# Patient Record
Sex: Female | Born: 1975
Health system: Southern US, Community
[De-identification: ages and names within clinical notes are randomized; demographics above are authoritative.]

## PROBLEM LIST (undated history)

## (undated) DIAGNOSIS — N186 End stage renal disease: Secondary | ICD-10-CM

## (undated) DIAGNOSIS — N039 Chronic nephritic syndrome with unspecified morphologic changes: Secondary | ICD-10-CM

## (undated) DIAGNOSIS — Z992 Dependence on renal dialysis: Secondary | ICD-10-CM

## (undated) DIAGNOSIS — I10 Essential (primary) hypertension: Secondary | ICD-10-CM

## (undated) DIAGNOSIS — K219 Gastro-esophageal reflux disease without esophagitis: Secondary | ICD-10-CM

## (undated) DIAGNOSIS — Z940 Kidney transplant status: Secondary | ICD-10-CM

## (undated) HISTORY — PX: OTHER SURGICAL HISTORY: SHX169

## (undated) HISTORY — DX: End stage renal disease: N18.6

## (undated) HISTORY — DX: Kidney transplant status: Z94.0

## (undated) HISTORY — DX: Chronic nephritic syndrome with unspecified morphologic changes: N03.9

## (undated) NOTE — *Deleted (*Deleted)
HEMATOLOGY/ONCOLOGY CONSULTATION NOTE  Date of Service: 07/23/2020  Patient Care Team: Richarda Osmond, DO as PCP - General  CHIEF COMPLAINTS/PURPOSE OF CONSULTATION:  Newly renal cell carcinoma  HISTORY OF PRESENTING ILLNESS:  Candace Griffin is a wonderful 66 y.o. female with a past medical history significant for chronic glomerulonephritis status post renal transplant in July 2019 and currently taking mycophenolate, tacrolimus, and prednisone, anemia, GERD, hypertension, menorrhagia.  The patient presented to the emergency room with chest pain or shortness of breath for several days.  In the emergency room, her hemoglobin was 4.0 (previously 8.6 on 05/15/2020) and chemistry showed a creatinine of 1.13, calcium 8.0, albumin 2.0, AST 336, ALT 105, alkaline phosphatase 242, and T bili 1.5.  2 units of PRBCs have been ordered.  She had a CTA of the chest which was negative for PE or any other acute finding.  CT of the abdomen and pelvis showed redemonstrated inguinal and intra-abdominal lymphadenopathy, multiple liver lesions-findings concerning for metastatic disease, lymphoma, or post transplant lymphoproliferative disease, 2.2 cm native left lower pole renal mass, left lower quadrant transplant kidney with areas of cortical scarring.  Of note, the retroperitoneal, pelvic, and inguinal lymphadenopathy was present on a CT scan performed on 05/16/2020.  She was referred back to her primary care provider for further work-up of the lymphadenopathy.  She states that she was scheduled to see her primary care provider today to follow-up on these results but ended up coming to the emergency room instead.  She has been seen by interventional radiology for for consideration of CT-guided biopsy of one of her inguinal lymph nodes and underwent CT-guided biopsy of the left inguinal lymph node just prior to my visit.  The patient is somewhat sleepy at the time of visit since she just returned from IR.   She is able to awaken and answer questions.  She states that she has had fatigue and generalized weakness as well as a weight loss of about 20 pounds since the end of August 2021.  She has not been able to eat or drink much of anything secondary to nausea and vomiting.  She reports fevers, but no chills or night sweats.  She is able to palpate the lymph nodes in her inguinal area.  She denies headaches and dizziness.  She has been experiencing chest pain or shortness of breath.  Denies abdominal pain.  She reports significant constipation over the past month.  Denies epistaxis, hemoptysis, hematemesis, hematuria, melena, hematochezia.  Denies lower extremity edema.  Medical oncology was asked to see the patient for recommendations regarding her abnormal CT scan findings and anemia.  INTERVAL HISTORY: Candace Griffin is a wonderful 54 y.o. female who is here for the evaluation and management of metastatic Papillary Renal Cell Carcinoma. The patient's last visit with [place] was on 06/26/2020. The pt reports that she is doing well overall.  The pt reports ***  Of note since the patient's last visit, pt has had *** completed on *** with results revealing ***.  Lab results today (07/23/20) of CBC w/diff and CMP is as follows: all values are WNL except for ***.  On review of systems, pt reports *** and denies ***and any other symptoms.   A&P: -Discussed pt labwork today, 07/23/20; *** -***  MEDICAL HISTORY:  Past Medical History:  Diagnosis Date  . Chronic glomerulonephritis   . Dialysis patient ([place])   . End stage renal disease ([place])   . GERD (gastroesophageal reflux disease)   .  History of renal transplant 10-27-2011  . Hypertension     SURGICAL HISTORY: Past Surgical History:  Procedure Laterality Date  . COLPOSCOPY  2016  . diaylsis shunt Right    arm  . KIDNEY TRANSPLANT  2011/10/27   deceased donor kidney    SOCIAL HISTORY: Social History   Socioeconomic History  . Marital status:  Single    Spouse name: Not on file  . Number of children: 3  . Years of education: 71  . Highest education level: Not on file  Occupational History    Comment: NA  Tobacco Use  . Smoking status: Never Smoker  . Smokeless tobacco: Never Used  Substance and Sexual Activity  . Alcohol use: No  . Drug use: No  . Sexual activity: Not Currently    Birth control/protection: None  Other Topics Concern  . Not on file  Social History Narrative   Lives at home with children   Caffeine - 10/27/16 "addicted to [employer], drank all day long", currently Mtn Dew 12 oz/week   Social Determinants of Health   Financial Resource Strain:   . Difficulty of Paying Living Expenses: Not on file  Food Insecurity:   . Worried About Charity fundraiser in the Last Year: Not on file  . Ran Out of Food in the Last Year: Not on file  Transportation Needs:   . Lack of Transportation (Medical): Not on file  . Lack of Transportation (Non-Medical): Not on file  Physical Activity:   . Days of Exercise per Week: Not on file  . Minutes of Exercise per Session: Not on file  Stress:   . Feeling of Stress : Not on file  Social Connections:   . Frequency of Communication with Friends and Family: Not on file  . Frequency of Social Gatherings with Friends and Family: Not on file  . Attends Religious Services: Not on file  . Active Member of Clubs or Organizations: Not on file  . Attends Archivist Meetings: Not on file  . Marital Status: Not on file  Intimate Partner Violence:   . Fear of Current or Ex-Partner: Not on file  . Emotionally Abused: Not on file  . Physically Abused: Not on file  . Sexually Abused: Not on file    FAMILY HISTORY: Family History  Problem Relation Age of Onset  . Diabetes Father   . Diabetes Paternal Aunt   . Diabetes Paternal Grandmother   . Alcohol abuse Paternal Grandmother   . Alcohol abuse Paternal Uncle   . Cancer Maternal Grandmother        breast  . Breast  cancer Maternal Grandmother   . Alcohol abuse Paternal Grandfather     ALLERGIES:  is allergic to lisinopril, chlorhexidine, and tape.  MEDICATIONS:  Current Outpatient Medications  Medication Sig Dispense Refill  . LORazepam (ATIVAN) 0.5 MG tablet Take 1 tablet (0.5 mg total) by mouth every 8 (eight) hours as needed for anxiety. 45 tablet 0  . meropenem 1 g in sodium chloride 0.9 % 100 mL Inject 1 g into the vein every 8 (eight) hours.    . mycophenolate (CELLCEPT) 250 MG capsule Take 500 mg by mouth 2 (two) times daily.     . ondansetron (ZOFRAN) 8 MG tablet Take 1 tablet (8 mg total) by mouth every 8 (eight) hours as needed for nausea or vomiting. 30 tablet 0  . oxyCODONE (ROXICODONE) 5 MG immediate release tablet Take 2 tablets (10 mg total) by  mouth every 4 (four) hours as needed for moderate pain or severe pain. 60 tablet 0  . pantoprazole sodium (PROTONIX) 40 mg/20 mL PACK Place 20 mLs (40 mg total) into feeding tube 2 (two) times daily. 30 mL   . predniSONE (DELTASONE) 5 MG tablet Take 5 mg by mouth daily with breakfast.     . scopolamine (TRANSDERM-SCOP) 1 MG/3DAYS Place 1 patch (1.5 mg total) onto the skin every 3 (three) days. 10 patch 12  . Tacrolimus ER (ENVARSUS XR) 1 MG TB24 Take 4 mg by mouth daily.      No current facility-administered medications for this visit.    REVIEW OF SYSTEMS:   A 10+ POINT REVIEW OF SYSTEMS WAS OBTAINED including neurology, dermatology, psychiatry, cardiac, respiratory, lymph, extremities, GI, GU, Musculoskeletal, constitutional, breasts, reproductive, HEENT.  All pertinent positives are noted in the HPI.  All others are negative.   PHYSICAL EXAMINATION: ECOG PERFORMANCE STATUS: 2 - Symptomatic, <50% confined to bed  . There were no vitals filed for this visit. There were no vitals filed for this visit. .There is no height or weight on file to calculate BMI.  *** GENERAL:alert, in no acute distress and comfortable SKIN: no acute rashes, no  significant lesions EYES: conjunctiva are pink and non-injected, sclera anicteric OROPHARYNX: MMM, no exudates, no oropharyngeal erythema or ulceration NECK: supple, no JVD LYMPH:  no palpable lymphadenopathy in the cervical, axillary or inguinal regions LUNGS: clear to auscultation b/l with normal respiratory effort HEART: regular rate & rhythm ABDOMEN:  normoactive bowel sounds , non tender, not distended. No palpable hepatosplenomegaly.  Extremity: no pedal edema PSYCH: alert & oriented x 3 with fluent speech NEURO: no focal motor/sensory deficits  LABORATORY DATA:  I have reviewed the data as listed  . CBC Latest Ref Rng & Units 07/16/2020 07/14/2020 07/13/2020  WBC 4.0 - 10.5 K/uL 27.6(H) 21.3(H) 21.5(H)  Hemoglobin 12.0 - 15.0 g/dL 8.3(L) 8.2(L) 8.6(L)  Hematocrit 36 - 46 % 26.2(L) 25.2(L) 26.9(L)  Platelets 150 - 400 K/uL 213 157 133(L)    . CMP Latest Ref Rng & Units 07/16/2020 07/15/2020 07/14/2020  Glucose 70 - 99 mg/dL 170(H) 93 270(H)  BUN 6 - 20 mg/dL 39(H) 40(H) 34(H)  Creatinine 0.44 - 1.00 mg/dL 1.24(H) 1.17(H) 1.33(H)  Sodium 135 - 145 mmol/L 144 145 141  Potassium 3.5 - 5.1 mmol/L 3.1(L) 3.3(L) 3.8  Chloride 98 - 111 mmol/L 114(H) 115(H) 112(H)  CO2 22 - 32 mmol/L 20(L) 20(L) 18(L)  Calcium 8.9 - 10.3 mg/dL 8.0(L) 7.9(L) 7.7(L)  Total Protein 6.5 - 8.1 g/dL 5.6(L) 5.7(L) -  Total Bilirubin 0.3 - 1.2 mg/dL 6.7(H) 6.4(H) -  Alkaline Phos 38 - 126 U/L 650(H) 625(H) -  AST 15 - 41 U/L 181(H) 200(H) -  ALT 0 - 44 U/L 62(H) 59(H) -       RADIOGRAPHIC STUDIES: I have personally reviewed the radiological images as listed and agreed with the findings in the report. CT ABDOMEN PELVIS WO CONTRAST  Result Date: 07/12/2020 CLINICAL DATA:  38 year old female with nausea and vomiting. Metastatic cancer of unknown primary. EXAM: CT CHEST, ABDOMEN AND PELVIS WITHOUT CONTRAST TECHNIQUE: Multidetector CT imaging of the chest, abdomen and pelvis was performed following  the standard protocol without IV contrast. COMPARISON:  CT of the chest abdomen pelvis dated 07/01/2020. FINDINGS: CT CHEST FINDINGS Evaluation of this exam is limited in the absence of intravenous contrast. Cardiovascular: There is no cardiomegaly or pericardial effusion. There is mild atherosclerotic calcification of the  aortic arch. The central pulmonary arteries are grossly unremarkable on this noncontrast CT. Mediastinum/Nodes: No definite hilar or mediastinal adenopathy. Evaluation however is limited in the absence of intravenous contrast as well as due to consolidative changes of the lungs. A feeding tube noted within the esophagus. The thyroid gland is grossly unremarkable. No mediastinal fluid collection. Lungs/Pleura: Small bilateral pleural effusions. There are large areas of consolidation involving the lower lobes as well as smaller areas of patchy ground-glass density involving the right middle lobe, right upper lobe, and lingula most consistent with multifocal pneumonia. Aspiration is not excluded clinical correlation is recommended. There is no pneumothorax. The central airways are patent. Musculoskeletal: No chest wall mass or suspicious bone lesions identified. CT ABDOMEN PELVIS FINDINGS No intra-abdominal free air. Small perihepatic free fluid. Hepatobiliary: Several faint hepatic hypodense lesions consistent with metastatic disease. These were better seen on the contrast enhanced CT of 07/01/2020. no intrahepatic biliary dilatation. High attenuating content within the gallbladder may represent sludge or vicarious excretion of recently administered contrast. Pancreas: The pancreas is grossly unremarkable. Spleen: Normal in size without focal abnormality. Adrenals/Urinary Tract: The adrenal glands are suboptimally visualized. Severe bilateral renal atrophy. There is a left lower quadrant renal transplant. There is no hydronephrosis or nephrolithiasis of the transplant kidney. No peritransplant fluid  collection. There is duplicated appearance of the renal transplant collecting system. The urinary bladder is unremarkable. Stomach/Bowel: A feeding tube with tip in the distal duodenum. There is no bowel obstruction. The appendix is normal. Vascular/Lymphatic: Moderate aortoiliac atherosclerotic disease. No portal venous gas. Large and bulky retroperitoneal adenopathy as well as bilateral inguinal adenopathy. Large adenopathy along the left iliac chain. Reproductive: The uterus is grossly unremarkable. Other: There is a 3.8 x 2.7 cm low attenuating collection with areas of calcification along the right pelvic sidewall similar to prior CT. This is not well characterized but may related to an old hematoma or necrotic collection. Musculoskeletal: There is diffuse subcutaneous edema and anasarca, new or worsened since the prior CT. Sclerotic osseous metastatic disease predominantly involving T12, L1, L2 as seen previously. Sclerotic metastatic involvement of the right pubic bone. No acute osseous pathology. IMPRESSION: 1. Multifocal pneumonia. Aspiration is not excluded. Clinical correlation is recommended. 2. Small bilateral pleural effusions, new or worsened since the prior CT. 3. Extensive retroperitoneal nodal metastatic disease as well as hepatic and osseous metastatic disease as seen previously. The liver lesions are better seen on the prior contrast enhanced CT. 4. Atrophic native kidneys with a Left lower quadrant renal transplant. No hydronephrosis or nephrolithiasis. 5. Diffuse subcutaneous edema and anasarca, new or worsened since the prior CT. 6. Aortic Atherosclerosis (ICD10-I70.0). Electronically Signed   By: Anner Crete M.D.   On: 07/12/2020 17:07   CT ABDOMEN PELVIS WO CONTRAST  Result Date: 06/26/2020 CLINICAL DATA:  Diffuse abdominal pain and vomiting. History of cancer and renal transplant. Liver mass post recent liver biopsy. EXAM: CT ABDOMEN AND PELVIS WITHOUT CONTRAST TECHNIQUE:  Multidetector CT imaging of the abdomen and pelvis was performed following the standard protocol without IV contrast. COMPARISON:  Contrast-enhanced exam 05/27/2020. FINDINGS: Lower chest: No focal airspace disease, pleural fluid, or pulmonary nodularity. Heart is normal in size. Hepatobiliary: Patient's known hepatic lesions are not well demonstrated on noncontrast exam. Liver parenchyma is heterogeneous. There may be a small subcapsular hematoma inferiorly from recent liver biopsy, not well-defined on this noncontrast exam. The gallbladder is present. There is high-density material in the gallbladder lumen, possibly sludge. No calcified gallstone. Common bile  duct is poorly defined, but no evidence of biliary dilatation. Pancreas: No ductal dilatation or inflammation. Spleen: Normal in size without focal abnormality. Adrenals/Urinary Tract: Normal adrenal glands. Chronic bilateral native renal atrophy. Solid-appearing lesion in the lower left renal hilum was better appreciated on prior contrast-enhanced CT, grossly unchanged, series 2, image 35. Transplant kidney in the left lower quadrant. Scarring in the left lower pole is better appreciated on prior contrast-enhanced exam. Similar fullness of the renal collecting system without frank hydronephrosis. No transplant perinephric edema. Sequela of failed transplant in the right iliac fossa. The urinary bladder is unremarkable. Stomach/Bowel: Bowel evaluation is limited in the absence of enteric contrast. There is no bowel obstruction or evidence of bowel inflammation. The appendix is normal. Vascular/Lymphatic: Bulky retroperitoneal adenopathy, some of which is low-density and typical of necrosis. Occasional areas of nodal calcification. Adenopathy extends from the retrocrural space to the bilateral common iliac arteries, and left external iliac station. There also enlarged bilateral inguinal lymph nodes, left greater than right. Index aortocaval node measures 4.1 x  2.4 cm, previously 4.2 x 2.6 cm. Overall adenopathy is not significantly changed in the interim allowing for noncontrast technique. There is aortic and branch atherosclerosis. Reproductive: Bulky uterus with fibroids.  No obvious adnexal mass. Other: There is trace free fluid in the pelvis, slightly increased from prior exam. No other free fluid or ascites. No free air. Musculoskeletal: Ill-defined sclerosis involving right L2 vertebral body, nonspecific but unchanged from previous. Sclerotic density in the left acetabulum. There is hemi transitional lumbosacral anatomy. No fracture or acute osseous abnormality. IMPRESSION: 1. Patient's known hepatic lesions are not well-defined on this noncontrast exam. There may be a small subcapsular hematoma inferiorly from recent liver biopsy, not well-defined. No evidence of hemoperitoneum. 2. Bulky retroperitoneal and bilateral inguinal adenopathy, grossly unchanged from prior. 3. Transplant kidney in the left lower quadrant with unchanged fullness of the renal collecting system. Sequela of failed transplant in the right iliac fossa. Stable soft tissue density in the region of the native left kidney lower hilum. 4. Bulky uterus with fibroids. 5. Nonspecific sclerosis involving the right aspect of L2 vertebral body. Aortic Atherosclerosis (ICD10-I70.0). Electronically Signed   By: Keith Rake M.D.   On: 06/26/2020 16:45   CT HEAD WO CONTRAST  Result Date: 07/10/2020 CLINICAL DATA:  Delirium, history of urologic malignancy. EXAM: CT HEAD WITHOUT CONTRAST TECHNIQUE: Contiguous axial images were obtained from the base of the skull through the vertex without intravenous contrast. COMPARISON:  MRI 06/28/2020 FINDINGS: Brain: No evidence of acute infarction, hemorrhage, hydrocephalus, extra-axial collection, mass effect or visible mass lesion 4 within the limitations of an unenhanced CT exam. Basal cisterns are patent. Midline intracranial structures are normal. Cerebellar  tonsils are normally positioned. Vascular: No hyperdense vessel or unexpected calcification. Skull: Few prominent venous lakes. No convincing focal sclerotic or lytic osseous metastatic disease. A previously seen C2 vertebral body metastasis is not within the margins of imaging. Scalp soft tissues are unremarkable. Sinuses/Orbits: Paranasal sinuses and mastoid air cells are predominantly clear. Included orbital structures are unremarkable. Other: None IMPRESSION: 1. No acute intracranial findings. No visible metastatic lesions within the limitations of unenhanced CT imaging. 2. A previously seen C2 vertebral body metastasis is not within the margins of imaging. Electronically Signed   By: Lovena Le M.D.   On: 07/10/2020 19:59   CT CHEST WO CONTRAST  Result Date: 07/12/2020 CLINICAL DATA:  49 year old female with nausea and vomiting. Metastatic cancer of unknown primary. EXAM: CT CHEST,  ABDOMEN AND PELVIS WITHOUT CONTRAST TECHNIQUE: Multidetector CT imaging of the chest, abdomen and pelvis was performed following the standard protocol without IV contrast. COMPARISON:  CT of the chest abdomen pelvis dated 07/01/2020. FINDINGS: CT CHEST FINDINGS Evaluation of this exam is limited in the absence of intravenous contrast. Cardiovascular: There is no cardiomegaly or pericardial effusion. There is mild atherosclerotic calcification of the aortic arch. The central pulmonary arteries are grossly unremarkable on this noncontrast CT. Mediastinum/Nodes: No definite hilar or mediastinal adenopathy. Evaluation however is limited in the absence of intravenous contrast as well as due to consolidative changes of the lungs. A feeding tube noted within the esophagus. The thyroid gland is grossly unremarkable. No mediastinal fluid collection. Lungs/Pleura: Small bilateral pleural effusions. There are large areas of consolidation involving the lower lobes as well as smaller areas of patchy ground-glass density involving the right  middle lobe, right upper lobe, and lingula most consistent with multifocal pneumonia. Aspiration is not excluded clinical correlation is recommended. There is no pneumothorax. The central airways are patent. Musculoskeletal: No chest wall mass or suspicious bone lesions identified. CT ABDOMEN PELVIS FINDINGS No intra-abdominal free air. Small perihepatic free fluid. Hepatobiliary: Several faint hepatic hypodense lesions consistent with metastatic disease. These were better seen on the contrast enhanced CT of 07/01/2020. no intrahepatic biliary dilatation. High attenuating content within the gallbladder may represent sludge or vicarious excretion of recently administered contrast. Pancreas: The pancreas is grossly unremarkable. Spleen: Normal in size without focal abnormality. Adrenals/Urinary Tract: The adrenal glands are suboptimally visualized. Severe bilateral renal atrophy. There is a left lower quadrant renal transplant. There is no hydronephrosis or nephrolithiasis of the transplant kidney. No peritransplant fluid collection. There is duplicated appearance of the renal transplant collecting system. The urinary bladder is unremarkable. Stomach/Bowel: A feeding tube with tip in the distal duodenum. There is no bowel obstruction. The appendix is normal. Vascular/Lymphatic: Moderate aortoiliac atherosclerotic disease. No portal venous gas. Large and bulky retroperitoneal adenopathy as well as bilateral inguinal adenopathy. Large adenopathy along the left iliac chain. Reproductive: The uterus is grossly unremarkable. Other: There is a 3.8 x 2.7 cm low attenuating collection with areas of calcification along the right pelvic sidewall similar to prior CT. This is not well characterized but may related to an old hematoma or necrotic collection. Musculoskeletal: There is diffuse subcutaneous edema and anasarca, new or worsened since the prior CT. Sclerotic osseous metastatic disease predominantly involving T12, L1, L2  as seen previously. Sclerotic metastatic involvement of the right pubic bone. No acute osseous pathology. IMPRESSION: 1. Multifocal pneumonia. Aspiration is not excluded. Clinical correlation is recommended. 2. Small bilateral pleural effusions, new or worsened since the prior CT. 3. Extensive retroperitoneal nodal metastatic disease as well as hepatic and osseous metastatic disease as seen previously. The liver lesions are better seen on the prior contrast enhanced CT. 4. Atrophic native kidneys with a Left lower quadrant renal transplant. No hydronephrosis or nephrolithiasis. 5. Diffuse subcutaneous edema and anasarca, new or worsened since the prior CT. 6. Aortic Atherosclerosis (ICD10-I70.0). Electronically Signed   By: Anner Crete M.D.   On: 07/12/2020 17:07   MR BRAIN WO CONTRAST  Result Date: 07/12/2020 CLINICAL DATA:  Metastatic carcinoma, mental status change EXAM: MRI HEAD WITHOUT CONTRAST TECHNIQUE: Multiplanar, multiecho pulse sequences of the brain and surrounding structures were obtained without intravenous contrast. COMPARISON:  06/28/2020 FINDINGS: Brain: There is no acute infarction or intracranial hemorrhage. There is no intracranial mass, mass effect, or edema. There is no hydrocephalus or  extra-axial fluid collection. Ventricles are normal in size and configuration. Vascular: Major vessel flow voids at the skull base are preserved. Skull and upper cervical spine: Discrete C2 lesion on prior postcontrast imaging is not identified on this noncontrast study. Sinuses/Orbits: Paranasal sinuses are aerated. Orbits are unremarkable. Other: Sella is unremarkable.  Mastoid air cells are clear. IMPRESSION: No acute intracranial abnormality. Electronically Signed   By: Macy Mis M.D.   On: 07/12/2020 20:45   MR Brain W Wo Contrast  Result Date: 06/28/2020 CLINICAL DATA:  Urologic cancer, staging EXAM: MRI HEAD WITHOUT AND WITH CONTRAST TECHNIQUE: Multiplanar, multiecho pulse sequences of  the brain and surrounding structures were obtained without and with intravenous contrast. CONTRAST:  13mL GADAVIST GADOBUTROL 1 MMOL/ML IV SOLN COMPARISON:  MRI head 11/17/2016 FINDINGS: Brain: No acute infarction, hemorrhage, hydrocephalus, extra-axial collection or mass lesion. Normal white matter. Negative for metastatic disease to the brain. No enhancing mass lesion in the brain. Vascular: Normal arterial flow voids. Skull and upper cervical spine: Enhancing lesion in the lower body of the C2 vertebral body. This shows decreased signal prior to contrast and is consistent with metastatic disease. No fracture or cord compression. No skull lesion identified. Benign fatty lesion in the left parietal bone. Sinuses/Orbits: Paranasal sinuses clear.  Negative orbit Other: None IMPRESSION: Negative for metastatic disease to the brain 1 cm enhancing lesion C2 vertebral body compatible with metastatic disease. No fracture or cord compression Electronically Signed   By: Franchot Gallo M.D.   On: 06/28/2020 21:31   MR LUMBAR SPINE WO CONTRAST  Result Date: 07/12/2020 CLINICAL DATA:  Initial evaluation for severe lower back pain and pelvic pain, history of renal transplant with metastatic carcinoma. EXAM: MRI LUMBAR SPINE WITHOUT CONTRAST MRI PELVIS WITHOUT CONTRAST TECHNIQUE: Multiplanar, multisequence MR imaging of the lumbar spine was performed. No intravenous contrast was administered. COMPARISON:  Prior CT from earlier the same day. FINDINGS: MRI LUMBAR SPINE FINDINGS: Segmentation: Standard. Lowest well-formed disc space labeled the L5-S1 level. Alignment: Physiologic with preservation of the normal lumbar lordosis. No listhesis or static subluxation. Vertebrae: Abnormal T1 hypointense, STIR hyperintense lesion seen involving the T12, L1, and L2 vertebral bodies, consistent with osseous metastatic disease. Involvement of these levels is fairly diffuse and infiltrative in nature. Heterogeneous marrow signal elsewhere  within the visualized lumbosacral spine without definite discrete metastasis. No associated pathologic fracture. No extra osseous extension or epidural extension of tumor. Conus medullaris and cauda equina: Conus extends to the L1 level. Conus and cauda equina appear normal. Paraspinal and other soft tissues: Extensive bulky retroperitoneal and iliac adenopathy, better seen on recent CT. Associated reactive marrow edema within the posterior paraspinous and psoas musculature. Marked atrophy of the native kidneys noted. Renal transplant within the left iliac fossa partially visualized. Disc levels: No significant disc pathology seen within the lumbar spine. Intervertebral discs are well hydrated with preserved disc height. No significant disc bulge or focal disc herniation. Minimal multilevel facet hypertrophy noted throughout the lumbar spine. Epidural lipomatosis noted within the lower lumbar spine. No significant spinal stenosis. Foramina remain patent. No impingement. MRI PELVIS FINDINGS: Examination is technically limited as the patient was unable to tolerate the full length of the examination, with the exam terminated early. Coronal T1 weighted sequence only was performed. Visualized bowels normal in caliber without evidence for obstruction. Normal appendix. Marked atrophy of the native kidneys with a renal transplant within the left iliac fossa. No appreciable hydronephrosis or other obvious abnormality about the renal transplant on this  limited exam. Urinary bladder within normal limits. Uterus within normal limits. Few nabothian cysts noted about the cervix. Ovaries not well evaluated on this limited study. No appreciable adnexal mass. Normal flow voids seen throughout the visualized abdomen and pelvis. Extensive bulky retroperitoneal and iliac adenopathy again seen, better evaluated on dedicated CT from earlier the same day. For reference purposes, the largest nodal conglomerate seen within the lower right  periaortic region and measures 5.0 x 4.7 cm (series 11, image 24). Scattered inguinal adenopathy noted as well, with largest node seen on the left and measuring 1.9 cm in short axis (series 11, image 26). Scattered small volume free fluid noted within the pelvis. Scattered edema within the psoas and paraspinous musculature better seen on corresponding lumbar spine portion of this exam. 4.6 x 4.3 cm heterogeneous and partially calcified collection at the right lower quadrant again noted, nonspecific, but may be related to a chronic hematoma or other collection (series 10, image 57). Finding better evaluated on prior CT. Heterogeneous signal intensity seen throughout the bone marrow of the pelvis. Probable osseous metastasis measuring approximately 1.8 cm seen at the right ischial tuberosity (series 11, image 13). No other definite discrete osseous metastatic lesions seen on this limited exam. IMPRESSION: 1. Osseous metastatic disease involving the T12, L1, and L2 vertebral bodies. No associated pathologic fracture or extra osseous extension of tumor. 2. Probable 1.8 cm osseous metastasis involving the right ischial tuberosity. 3. Extensive bulky retroperitoneal, iliac, and inguinal adenopathy as above, consistent with nodal metastatic disease. Findings better evaluated on recent CT. 4. Scattered edema within the posterior paraspinous and psoas musculature, with scattered small volume free fluid within the pelvis. Findings are likely reactive in nature. 5. Marked atrophy of the native kidneys with left lower quadrant renal transplant in place. No appreciable hydronephrosis or other obvious abnormality about the renal transplant on this limited exam. 6. No significant disc pathology or stenosis within the lumbar spine. No impingement. 7. Please note that the pelvic portion of this exam is markedly limited as the patient terminated the study early. Coronal T1 weighted sequence only was performed. Electronically Signed    By: Jeannine Boga M.D.   On: 07/12/2020 23:36   MR PELVIS WO CONTRAST  Result Date: 07/12/2020 CLINICAL DATA:  Initial evaluation for severe lower back pain and pelvic pain, history of renal transplant with metastatic carcinoma. EXAM: MRI LUMBAR SPINE WITHOUT CONTRAST MRI PELVIS WITHOUT CONTRAST TECHNIQUE: Multiplanar, multisequence MR imaging of the lumbar spine was performed. No intravenous contrast was administered. COMPARISON:  Prior CT from earlier the same day. FINDINGS: MRI LUMBAR SPINE FINDINGS: Segmentation: Standard. Lowest well-formed disc space labeled the L5-S1 level. Alignment: Physiologic with preservation of the normal lumbar lordosis. No listhesis or static subluxation. Vertebrae: Abnormal T1 hypointense, STIR hyperintense lesion seen involving the T12, L1, and L2 vertebral bodies, consistent with osseous metastatic disease. Involvement of these levels is fairly diffuse and infiltrative in nature. Heterogeneous marrow signal elsewhere within the visualized lumbosacral spine without definite discrete metastasis. No associated pathologic fracture. No extra osseous extension or epidural extension of tumor. Conus medullaris and cauda equina: Conus extends to the L1 level. Conus and cauda equina appear normal. Paraspinal and other soft tissues: Extensive bulky retroperitoneal and iliac adenopathy, better seen on recent CT. Associated reactive marrow edema within the posterior paraspinous and psoas musculature. Marked atrophy of the native kidneys noted. Renal transplant within the left iliac fossa partially visualized. Disc levels: No significant disc pathology seen within the lumbar spine.  Intervertebral discs are well hydrated with preserved disc height. No significant disc bulge or focal disc herniation. Minimal multilevel facet hypertrophy noted throughout the lumbar spine. Epidural lipomatosis noted within the lower lumbar spine. No significant spinal stenosis. Foramina remain patent. No  impingement. MRI PELVIS FINDINGS: Examination is technically limited as the patient was unable to tolerate the full length of the examination, with the exam terminated early. Coronal T1 weighted sequence only was performed. Visualized bowels normal in caliber without evidence for obstruction. Normal appendix. Marked atrophy of the native kidneys with a renal transplant within the left iliac fossa. No appreciable hydronephrosis or other obvious abnormality about the renal transplant on this limited exam. Urinary bladder within normal limits. Uterus within normal limits. Few nabothian cysts noted about the cervix. Ovaries not well evaluated on this limited study. No appreciable adnexal mass. Normal flow voids seen throughout the visualized abdomen and pelvis. Extensive bulky retroperitoneal and iliac adenopathy again seen, better evaluated on dedicated CT from earlier the same day. For reference purposes, the largest nodal conglomerate seen within the lower right periaortic region and measures 5.0 x 4.7 cm (series 11, image 24). Scattered inguinal adenopathy noted as well, with largest node seen on the left and measuring 1.9 cm in short axis (series 11, image 26). Scattered small volume free fluid noted within the pelvis. Scattered edema within the psoas and paraspinous musculature better seen on corresponding lumbar spine portion of this exam. 4.6 x 4.3 cm heterogeneous and partially calcified collection at the right lower quadrant again noted, nonspecific, but may be related to a chronic hematoma or other collection (series 10, image 57). Finding better evaluated on prior CT. Heterogeneous signal intensity seen throughout the bone marrow of the pelvis. Probable osseous metastasis measuring approximately 1.8 cm seen at the right ischial tuberosity (series 11, image 13). No other definite discrete osseous metastatic lesions seen on this limited exam. IMPRESSION: 1. Osseous metastatic disease involving the T12, L1, and  L2 vertebral bodies. No associated pathologic fracture or extra osseous extension of tumor. 2. Probable 1.8 cm osseous metastasis involving the right ischial tuberosity. 3. Extensive bulky retroperitoneal, iliac, and inguinal adenopathy as above, consistent with nodal metastatic disease. Findings better evaluated on recent CT. 4. Scattered edema within the posterior paraspinous and psoas musculature, with scattered small volume free fluid within the pelvis. Findings are likely reactive in nature. 5. Marked atrophy of the native kidneys with left lower quadrant renal transplant in place. No appreciable hydronephrosis or other obvious abnormality about the renal transplant on this limited exam. 6. No significant disc pathology or stenosis within the lumbar spine. No impingement. 7. Please note that the pelvic portion of this exam is markedly limited as the patient terminated the study early. Coronal T1 weighted sequence only was performed. Electronically Signed   By: Jeannine Boga M.D.   On: 07/12/2020 23:36   NM Hepatobiliary Liver Func  Result Date: 07/15/2020 CLINICAL DATA:  Right upper quadrant pain. EXAM: NUCLEAR MEDICINE HEPATOBILIARY IMAGING TECHNIQUE: Sequential images of the abdomen were obtained out to 60 minutes following intravenous administration of radiopharmaceutical. RADIOPHARMACEUTICALS:  8.0 mCi Tc-50m  Choletec IV COMPARISON:  None. FINDINGS: Prompt uptake and biliary excretion of activity by the liver is seen. Gallbladder activity is visualized, consistent with patency of cystic duct. Biliary activity passes into small bowel, consistent with patent common bile duct. IMPRESSION: Prompt visualization of the gallbladder without evidence of acute cholecystitis. Electronically Signed   By: Virgina Norfolk M.D.   On: 07/15/2020  17:25   CT ABDOMEN PELVIS W CONTRAST  Result Date: 07/01/2020 CLINICAL DATA:  Sepsis, abdominal pain, previous kidney transplant x2. Metastatic carcinoma of  unknown primary. EXAM: CT ABDOMEN AND PELVIS WITH CONTRAST TECHNIQUE: Multidetector CT imaging of the abdomen and pelvis was performed using the standard protocol following bolus administration of intravenous contrast. CONTRAST:  53mL OMNIPAQUE IOHEXOL 300 MG/ML  SOLN COMPARISON:  06/26/2020 and previous FINDINGS: Lower chest: Trace pleural effusions. Hepatobiliary: Multiple liver lesions. 3.2 cm segment 2 lesion shows definite progression since 05/27/2020, although is more wedge-shaped and may represent focal infarct. Multiple low-attenuation lesions in the right lobe have not convincingly changed allowing for differences in scan timing. There are nonspecific transient hepatic attenuation differences on the portal venous phase which resolve on delayed venous phase imaging. No biliary ductal dilatation. Gallbladder unremarkable. Pancreas: Unremarkable. No pancreatic ductal dilatation or surrounding inflammatory changes. Spleen: Normal in size. There peripheral areas of decreased enhancement which were not present on prior study of 05/27/2020, however this area was not included on the delayed scan, and may represent transient attenuation differences versus true lesions. Adrenals/Urinary Tract: Adrenal glands unremarkable. Marked atrophy of native kidneys. Normal enhancement of left iliac fossa transplant kidney. Coarse calcifications and low-attenuation in the right iliac fossa presumably related to remote transplant. The urinary bladder is incompletely distended. Stomach/Bowel: Stomach is decompressed. Small bowel is nondilated. The colon is incompletely distended, unremarkable. Vascular/Lymphatic: Bulky bilateral inguinal, left external iliac, bilateral common iliac, left para-aortic and aortocaval adenopathy as before. The larger nodes contain scattered calcifications and some with central low-attenuation regions as before. Moderate atheromatous aortoiliac plaque without aneurysm. Reproductive: Uterine fibroid.   No adnexal mass. Other: Small volume perihepatic and pelvic ascites slightly increased. No free air. Musculoskeletal: Poorly marginated sclerotic lesions in T12, L1, and L2 vertebral bodies, progressive since 05/16/2020. benign left supra-acetabular bone island stable since 02/03/2015. IMPRESSION: 1. No definite source of sepsis identified. 2. Extensive pelvic and retroperitoneal nodal; hepatic; and osseous metastatic disease. 3. Slight increase in small volume perihepatic and pelvic ascites. Aortic Atherosclerosis (ICD10-I70.0). Electronically Signed   By: Lucrezia Europe M.D.   On: 07/01/2020 14:23   DG CHEST PORT 1 VIEW  Result Date: 07/13/2020 CLINICAL DATA:  Central line placement. EXAM: PORTABLE CHEST 1 VIEW COMPARISON:  07/12/2020 FINDINGS: Enteric tube courses into the region of the stomach and off the film as tip is not visualized. Interval placement of right IJ central venous catheter which has tip over the right atrium and could be pulled back 4-5 cm. Lungs are somewhat hypoinflated demonstrate mild asymmetric bilateral perihilar opacification left worse than right with slight interval improvement as this may be due to interstitial edema versus infection. No effusion. No pneumothorax. Cardiomediastinal silhouette and remainder of the exam is unchanged. IMPRESSION: 1. Slight interval improvement in bilateral perihilar opacification left worse than right which may be due to interstitial edema versus infection. 2. Right IJ central venous catheter with tip over the right atrium and could be pulled back 4-5 cm. No pneumothorax. Electronically Signed   By: Marin Olp M.D.   On: 07/13/2020 09:59   DG CHEST PORT 1 VIEW  Result Date: 07/12/2020 CLINICAL DATA:  Dyspnea EXAM: PORTABLE CHEST 1 VIEW COMPARISON:  07/11/2020 FINDINGS: Supine chest radiograph. Lung volumes are small and pulmonary insufflation has diminished since prior examination. There is developing consolidation within the left mid lung zone,  likely infectious in the acute setting. Linear atelectasis within the right mid lung zone. No pneumothorax or pleural effusion.  Cardiac size within normal limits. Pulmonary vascularity is normal. IMPRESSION: Left mid lung zone consolidation, likely infectious. Electronically Signed   By: Fidela Salisbury MD   On: 07/12/2020 06:53   DG CHEST PORT 1 VIEW  Result Date: 07/11/2020 CLINICAL DATA:  Shortness of breath EXAM: PORTABLE CHEST 1 VIEW COMPARISON:  07/10/2020 FINDINGS: Hazy opacity of the left more than right lower chest. No visible effusion or pneumothorax. Normal heart size and mediastinal contours. IMPRESSION: Stable low volume chest with infiltrates or atelectasis at the bases. Electronically Signed   By: Monte Fantasia M.D.   On: 07/11/2020 07:39   DG CHEST PORT 1 VIEW  Result Date: 07/10/2020 CLINICAL DATA:  Liver carcinoma.  Hypertension. EXAM: PORTABLE CHEST 1 VIEW COMPARISON:  June 26, 2020 FINDINGS: There is consolidation in the left lower lobe with small left pleural effusion. There is ill-defined airspace opacity in the right base. Heart size and pulmonary vascularity are normal. No adenopathy. There is aortic atherosclerosis. No bone lesions. IMPRESSION: Consolidation left lower lobe with small left pleural effusion. Patchy infiltrate right base. Heart size within normal limits. No adenopathy evident. Aortic Atherosclerosis (ICD10-I70.0). Electronically Signed   By: Lowella Grip III M.D.   On: 07/10/2020 13:48   DG Chest Port 1 View  Result Date: 06/26/2020 CLINICAL DATA:  Weakness EXAM: PORTABLE CHEST 1 VIEW COMPARISON:  05/26/2020 FINDINGS: The heart size and mediastinal contours are within normal limits. Both lungs are clear. No pleural effusion or pneumothorax. The visualized skeletal structures are unremarkable. IMPRESSION: No acute process in the chest. Electronically Signed   By: Macy Mis M.D.   On: 06/26/2020 13:45   DG Abd Portable 2V  Result Date: 07/08/2020  CLINICAL DATA:  Nausea and vomiting. EXAM: PORTABLE ABDOMEN - 2 VIEW COMPARISON:  07/01/2020 FINDINGS: Gas distended transverse colon but no overt obstructive pattern. No abnormal stool retention or evidence of small bowel obstruction. Clear lung bases. Calcification in the right iliac fossa from failed transplant based on CT. IMPRESSION: Normal bowel gas pattern.  No abnormal stool retention. Electronically Signed   By: Monte Fantasia M.D.   On: 07/08/2020 11:18   DG [facility] Dys Tube Plc W/Fl W/Rad  Result Date: 07/12/2020 CLINICAL DATA:  Poor fluid intake. Hemodialysis patient. Failed bedside attempt at feeding tube placement. EXAM: FEEDING TUBE PLACEMENT UNDER FLUOROSCOPY CONTRAST:  Approximately 15 cc Omnipaque 300 per feeding tube. FLUOROSCOPY TIME:  Fluoroscopy Time: 4 minutes and 48 seconds of low-dose pulsed fluoroscopy. Radiation Exposure Index (if provided by the fluoroscopic device): 81.9 mGy Number of Acquired Spot Images: 0 COMPARISON:  Chest CT 07/13/2019 FINDINGS: Time out procedure was performed. A feeding tube was placed via the left nares and advanced into the esophagus and stomach under fluoroscopy. The tube was manipulated into the duodenum with the aid of a stiff Amplatz wire. Tube position in the descending duodenum was confirmed with a spot image following the injection of a small amount of contrast through the tube. The patient tolerated the procedure without immediate complications. IMPRESSION: Feeding tube placement into the mid duodenum as described. Electronically Signed   By: Richardean Sale M.D.   On: 07/12/2020 14:27   EEG adult  Result Date: 07/11/2020 Lora Havens, MD     07/11/2020  3:41 PM Patient Name: ERICCA ZUHLKE MRN: [id number] Epilepsy Attending: Lora Havens Referring Physician/Provider: Dr Carlisle Cater Date: 07/11/2020 Duration: 24.15 minutes Patient history: 38 year old female with altered mental status.  EEG to evaluate for seizures. Level of  alertness: Awake AEDs during EEG study: None Technical aspects: This EEG study was done with scalp electrodes positioned according to the 10-20 International system of electrode placement. Electrical activity was acquired at a sampling rate of 500Hz  and reviewed with a high frequency filter of 70Hz  and a low frequency filter of 1Hz . EEG data were recorded continuously and digitally stored. Description: The posterior dominant rhythm consists of 8 Hz activity of moderate voltage (25-35 uV) seen predominantly in posterior head regions, symmetric and reactive to eye opening and eye closing.  Hyperventilation and photic stimulation were not performed.   IMPRESSION: This study is within normal limits. No seizures or epileptiform discharges were seen throughout the recording. Lora Havens   ECHOCARDIOGRAM COMPLETE  Result Date: 07/06/2020    ECHOCARDIOGRAM REPORT   Patient Name:   SHANYN TARDIF Date of Exam: 07/06/2020 Medical Rec #:  [id number]           Height:       67.0 in Accession #:    [id number]          Weight:       126.3 lb Date of Birth:  [birth year]            BSA:          1.664 m Patient Age:    60 years            BP:           128/88 mmHg Patient Gender: F                   HR:           131 bpm. Exam Location:  Inpatient Procedure: 2D Echo, Cardiac Doppler and Color Doppler Indications:    R00.2 Palpitations  History:        Patient has no prior history of Echocardiogram examinations.                 Risk Factors:Hypertension. GERD. ESRD.  Sonographer:    Jonelle Sidle Dance Referring Phys: [place]  1. Left ventricular ejection fraction, by estimation, is >75%. The left ventricle has hyperdynamic function. The left ventricle has no regional wall motion abnormalities. Left ventricular diastolic parameters are indeterminate.  2. Right ventricular systolic function is normal. The right ventricular size is normal. There is normal pulmonary artery systolic pressure.  3. The mitral  valve is normal in structure. No evidence of mitral valve regurgitation. No evidence of mitral stenosis.  4. The aortic valve is tricuspid. Aortic valve regurgitation is not visualized. No aortic stenosis is present.  5. The inferior vena cava is normal in size with greater than 50% respiratory variability, suggesting right atrial pressure of 3 mmHg. FINDINGS  Left Ventricle: Left ventricular ejection fraction, by estimation, is >75%. The left ventricle has hyperdynamic function. The left ventricle has no regional wall motion abnormalities. The left ventricular internal cavity size was normal in size. There is no left ventricular hypertrophy. Left ventricular diastolic parameters are indeterminate. Right Ventricle: The right ventricular size is normal.Right ventricular systolic function is normal. There is normal pulmonary artery systolic pressure. The tricuspid regurgitant velocity is 2.50 m/s, and with an assumed right atrial pressure of 3 mmHg, the estimated right ventricular systolic pressure is [id number] mmHg. Left Atrium: Left atrial size was normal in size. Right Atrium: Right atrial size was normal in size. Pericardium: Trivial pericardial effusion is present. Mitral Valve: The mitral valve is normal in structure. No evidence  of mitral valve regurgitation. No evidence of mitral valve stenosis. Tricuspid Valve: The tricuspid valve is normal in structure. Tricuspid valve regurgitation is mild . No evidence of tricuspid stenosis. Aortic Valve: The aortic valve is tricuspid. Aortic valve regurgitation is not visualized. No aortic stenosis is present. Pulmonic Valve: The pulmonic valve was normal in structure. Pulmonic valve regurgitation is not visualized. No evidence of pulmonic stenosis. Aorta: The aortic root is normal in size and structure. Venous: The inferior vena cava is normal in size with greater than 50% respiratory variability, suggesting right atrial pressure of 3 mmHg.  LEFT VENTRICLE PLAX 2D LVIDd:          3.30 cm LVIDs:         2.50 cm LV PW:         0.90 cm LV IVS:        1.00 cm LVOT diam:     1.90 cm LV SV:         51 LV SV Index:   31 LVOT Area:     2.84 cm  RIGHT VENTRICLE             IVC RV Basal diam:  2.00 cm     IVC diam: 0.80 cm RV S prime:     15.90 cm/s TAPSE (M-mode): 1.9 cm LEFT ATRIUM             Index       RIGHT ATRIUM          Index LA diam:        2.90 cm 1.74 cm/m  RA Area:     6.11 cm LA Vol (A2C):   26.9 ml 16.17 ml/m RA Volume:   8.36 ml  5.03 ml/m LA Vol (A4C):   10.0 ml 6.00 ml/m LA Biplane Vol: 16.5 ml 9.92 ml/m  AORTIC VALVE LVOT Vmax:   112.00 cm/s LVOT Vmean:  78.300 cm/s LVOT VTI:    0.181 m  AORTA Ao Root diam: 2.80 cm Ao Asc diam:  2.90 cm MITRAL VALVE               TRICUSPID VALVE MV Area (PHT): 4.85 cm    TR Peak grad:   25.0 mmHg MV Decel Time: 157 msec    TR Vmax:        250.00 cm/s MV E velocity: 98.00 cm/s                            SHUNTS                            Systemic VTI:  0.18 m                            Systemic Diam: 1.90 cm Kirk Ruths MD Electronically signed by Kirk Ruths MD Signature Date/Time: 07/06/2020/2:34:19 PM    Final    VAS [place] LOWER EXTREMITY VENOUS (DVT)  Result Date: 07/09/2020  Lower Venous DVT Study Indications: Pain in thighs bilaterally.  Risk Factors: Cancer Metastatic carcinoma to liver, hx of RCC. Comparison Study: No prior studies. Performing Technologist: Darlin Coco, RDMS  Examination Guidelines: A complete evaluation includes B-mode imaging, spectral Doppler, color Doppler, and power Doppler as needed of all accessible portions of each vessel. Bilateral testing is considered an integral part of a complete examination. Limited examinations for reoccurring indications  may be performed as noted. The reflux portion of the exam is performed with the patient in reverse Trendelenburg.  +---------+---------------+---------+-----------+----------+--------------+ RIGHT     CompressibilityPhasicitySpontaneityPropertiesThrombus Aging +---------+---------------+---------+-----------+----------+--------------+ CFV      Full           Yes      Yes                                 +---------+---------------+---------+-----------+----------+--------------+ SFJ      Full                                                        +---------+---------------+---------+-----------+----------+--------------+ FV Prox  Full                                                        +---------+---------------+---------+-----------+----------+--------------+ FV Mid   Full                                                        +---------+---------------+---------+-----------+----------+--------------+ FV DistalFull                                                        +---------+---------------+---------+-----------+----------+--------------+ PFV      Full                                                        +---------+---------------+---------+-----------+----------+--------------+ POP      Full           Yes      Yes                                 +---------+---------------+---------+-----------+----------+--------------+ PTV      Full                                                        +---------+---------------+---------+-----------+----------+--------------+ PERO     Full                                                        +---------+---------------+---------+-----------+----------+--------------+   +---------+---------------+---------+-----------+----------+--------------+ LEFT     CompressibilityPhasicitySpontaneityPropertiesThrombus Aging +---------+---------------+---------+-----------+----------+--------------+ CFV      Full  Yes      Yes                                 +---------+---------------+---------+-----------+----------+--------------+ SFJ      Full                                                         +---------+---------------+---------+-----------+----------+--------------+ FV Prox  Full                                                        +---------+---------------+---------+-----------+----------+--------------+ FV Mid   Full                                                        +---------+---------------+---------+-----------+----------+--------------+ FV DistalFull                                                        +---------+---------------+---------+-----------+----------+--------------+ PFV      Full                                                        +---------+---------------+---------+-----------+----------+--------------+ POP      Full           Yes      Yes                                 +---------+---------------+---------+-----------+----------+--------------+ PTV      Full                                                        +---------+---------------+---------+-----------+----------+--------------+ PERO     Full                                                        +---------+---------------+---------+-----------+----------+--------------+     Summary: RIGHT: - There is no evidence of deep vein thrombosis in the lower extremity.  - No cystic structure found in the popliteal fossa. - Ultrasound characteristics of enlarged lymph nodes are noted in the groin.  LEFT: - There is no evidence of deep vein thrombosis in the lower extremity.  - No cystic structure found in the popliteal fossa. - Ultrasound characteristics of enlarged lymph  nodes noted in the groin.  *See table(s) above for measurements and observations. Electronically signed by Curt Jews MD on 07/09/2020 at 4:37:48 PM.    Final    US Abdomen Limited RUQ (LIVER/GB)  Result Date: 07/13/2020 CLINICAL DATA:  Concern for cholecystitis. EXAM: ULTRASOUND ABDOMEN LIMITED RIGHT UPPER QUADRANT COMPARISON:  Ultrasound June 27, 2020. CT scan July 12, 2020.  FINDINGS: Gallbladder: Tumefactive sludge is seen in the gallbladder. A small amount of pericholecystic fluid is identified. No Murphy's sign is identified. No wall thickening or stones. Common bile duct: Diameter: 4 mm Liver: Diffusely heterogeneous consistent with multiple known lesions. A representative metastatic lesion measures up to 3.2 cm. Portal vein is patent on color Doppler imaging with normal direction of blood flow towards the liver. Other: None. IMPRESSION: 1. There is significant sludge in the gallbladder. There is now a small amount of pericholecystic fluid. However, no Murphy's sign, wall thickening, or stone identified. A HIDA scan could further evaluate if the picture remains clinically ambiguous. 2. Known metastatic disease in the liver, better assessed on CT imaging. Electronically Signed   By: Dorise Bullion III M.D   On: 07/13/2020 18:18   US Abdomen Limited RUQ (LIVER/GB)  Result Date: 06/27/2020 CLINICAL DATA:  Abdominal pain.  Known liver lesions. EXAM: ULTRASOUND ABDOMEN LIMITED RIGHT UPPER QUADRANT COMPARISON:  Noncontrast abdominal CT yesterday. Contrast-enhanced abdominal CT 05/27/2020 FINDINGS: Gallbladder: Physiologically distended containing intraluminal sludge. No shadowing stones. No wall thickening. No sonographic Murphy sign noted by sonographer. Common bile duct: Diameter: 4-5 mm. Liver: Heterogeneous liver. Many of the liver lesions on prior contrast-enhanced CT are not well seen by ultrasound. There is a 1.8 x 1.5 x 1.5 cm complex hypoechoic lesion in the left lobe. No convincing subcapsular collection is suggested on CT. Portal vein is patent on color Doppler imaging with normal direction of blood flow towards the liver. Other: No right upper quadrant ascites. IMPRESSION: 1. Gallbladder sludge. No gallstones or sonographic findings of acute cholecystitis. 2. No biliary dilatation. 3. Heterogeneous liver with 1.8 cm hypoechoic lesion in the left lobe of the liver. Many of  the additional liver lesions on prior contrast-enhanced CT are not well seen by ultrasound. 4. No sonographic evidence of subcapsular collection is suggested on CT. Electronically Signed   By: Keith Rake M.D.   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 Patient with h/o renal transplantation on tacrolimus, prednisone and cellcept chronic immunosuppression with extensive metastatic disease with extensive abd/pelvic LNadenopathy and hepatic metastases.  IHC from LN and liver metastases -- non specific morphology and IC ? Gyn vs renal tubular origin. Tumor markers unrevealing Component     Latest Ref Rng & Units 05/29/2020 05/31/2020  CEA     0.0 - 4.7 ng/mL 0.9   AFP, Serum, Tumor Marker     0.0 - 8.3 ng/mL 1.7   CA 19-9     0 - 35 U/mL 24   Cancer Antigen (CA) 125     0.0 - 38.1 U/mL 20.1   hCG Quant     mIU/mL 3   LDH     98 - 192 U/L  178   PLAN: *** -Will begin Cabozantinib once symptoms controlled. Dose reduction to 20mg  po daily in setting of significant liver abnormalities -f/u with nephrology/renal transplant team to try to minimize immunosuppression as  much as possible.   FOLLOW UP: ***   The total time spent in the appt was *** minutes and more than 50% was on counseling and direct patient cares.  All of the patient's questions were answered with apparent satisfaction. The patient knows to call the clinic with any problems, questions or concerns.    Sullivan Lone MD [place] AAHIVMS [facility] Hematology/Oncology Physician [facility]  (Office):       [phone] (Work cell):  [phone] (Fax):           [phone]  07/23/2020 11:14 PM  I, Yevette Edwards, am acting as a scribe for Dr. Sullivan Lone.   {Add Barista Statement}

## (undated) NOTE — *Deleted (*Deleted)
Neurology Consult Note    Chief Complaint: new onset confusion Referring MD: Raiford Noble  HPI: Candace Griffin is an 37 y.o. 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 Patient presented to the ED on 11/3 with n/v, abdominal pain, and anorexia with poor po intake. She was hypotensive and tachycardic on admission and dx'd with SIRS/sepsis and started on broad spectrum antibiotics. Work up noted lactic acidosis and elevated LFTs significant for sepsis and acute hepatitis. Thoughts were given to pancreatitis, but her Lipase was normal. No cholecystitis on [place]. Her LA has since decreased from 10.3 to 1.6. Her WBCC remains elevated along with neutrophils. She is on Prednisone chronically. Oncology is following her. Her Marinol was stopped thinking this could be a culprit of her confusion.   On 11/17, staff noted periods of lucidity with bouts of confusion and confabulation. Ammonia was normal. Prior MRI brain showed no brain mets but an enhanced lesion to C1 thought to be spinal mets.   Today, pt continued to be confused and neuro was consulted by [facility] attending. [facility] has ordered another MRI brain and EEG. MRI brain has not been performed yet, but EEG is pending read.   Past Medical History:  Diagnosis Date  . Chronic glomerulonephritis   . Dialysis patient ([place])   . End stage renal disease ([place])   . GERD (gastroesophageal reflux disease)   . History of renal transplant 10/21/2011  . Hypertension     Past Surgical History:  Procedure Laterality Date  . COLPOSCOPY  2016  . diaylsis shunt Right    arm  . KIDNEY TRANSPLANT  2011-10-21   deceased donor kidney    Family History  Problem Relation Age of Onset  . Diabetes Father   . Diabetes Paternal Aunt   . Diabetes Paternal Grandmother   . Alcohol abuse Paternal Grandmother   . Alcohol abuse Paternal Uncle    . Cancer Maternal Grandmother        breast  . Breast cancer Maternal Grandmother   . Alcohol abuse Paternal Grandfather    Social History:  reports that she has never smoked. She has never used smokeless tobacco. She reports that she does not drink alcohol and does not use drugs.  Allergies:  Allergies  Allergen Reactions  . Lisinopril Cough  . Tape Rash and Other (See Comments)    Causes welts also    Medications Prior to Admission  Medication Sig Dispense Refill  . dexamethasone (DECADRON) 4 MG tablet Take 2 tablets (8 mg total) by mouth daily. Start the day after carboplatin chemotherapy for 3 days. 30 tablet 1  . LORazepam (ATIVAN) 0.5 MG tablet Take 1 tablet (0.5 mg total) by mouth every 8 (eight) hours as needed for anxiety. 45 tablet 0  . mycophenolate (CELLCEPT) 250 MG capsule Take 500 mg by mouth 2 (two) times daily.     Mar[place] Kitchen omeprazole (PRILOSEC) 20 MG capsule Take 20 mg by mouth 2 (two) times daily as needed (for heart burn).     . ondansetron (ZOFRAN) 8 MG tablet Take 1 tablet (8 mg total) by mouth every 8 (eight) hours as needed for nausea or vomiting. 30 tablet 0  . oxyCODONE (ROXICODONE) 5 MG immediate release tablet Take 2 tablets (10 mg total) by mouth every 4 (four) hours as needed for moderate pain or severe pain. 60 tablet 0  .  predniSONE (DELTASONE) 5 MG tablet Take 5 mg by mouth daily with breakfast.     . Tacrolimus ER (ENVARSUS XR) 1 MG TB24 Take 4 mg by mouth daily.     . fluconazole (DIFLUCAN) 100 MG tablet Take 1 tablet (100 mg total) by mouth daily. (Patient not taking: Reported on 06/26/2020) 1 tablet 0  . lidocaine-prilocaine (EMLA) cream Apply to affected area once (Patient not taking: Reported on 06/26/2020) 30 g 3  . LORazepam (ATIVAN) 0.5 MG tablet Take 1 tablet (0.5 mg total) by mouth every 6 (six) hours as needed (Nausea or vomiting). (Patient not taking: Reported on 06/13/2020) 30 tablet 0  . ondansetron (ZOFRAN) 8 MG tablet Take 1 tablet (8 mg total)  by mouth 2 (two) times daily as needed for refractory nausea / vomiting. Start on day 3 after carboplatin chemo. (Patient not taking: Reported on 06/26/2020) 30 tablet 1  . pravastatin (PRAVACHOL) 20 MG tablet Take 20 mg by mouth at bedtime.     . prochlorperazine (COMPAZINE) 10 MG tablet Take 1 tablet (10 mg total) by mouth every 6 (six) hours as needed (Nausea or vomiting). (Patient not taking: Reported on 06/26/2020) 30 tablet 1  . senna (SENOKOT) 8.6 MG TABS tablet Take 1 tablet (8.6 mg total) by mouth daily as needed for moderate constipation. (Patient not taking: Reported on 06/26/2020) 90 tablet 0    ROS: ***  Physical Examination: Blood pressure 132/90, pulse (!) 125, temperature 98.4 F (36.9 C), temperature source Oral, resp. rate (!) 28, height 5\' 7"  (1.702 m), weight 57.3 kg, SpO2 100 %.  HEENT-  Normocephalic, no lesions, without obvious abnormality.  Normal external eye and conjunctiva.  Normal TM's bilaterally.  Normal auditory canals and external ears. Normal external nose, mucus membranes and septum.  Normal pharynx. Neck supple with no masses, nodes, nodules or enlargement. Cardiovascular - {Exam; heart:5510} Lungs - {Exam; lungs:5033::"Heart exam - S1, S2 normal, no murmur, no gallop, rate regular"} Abdomen - {Exam; abdomen:16834} Extremities - {System extremities adult exam:32070}  Neurologic Examination: ***  Results for orders placed or performed during the hospital encounter of 06/26/20 (from the past 48 hour(s))  Basic metabolic panel     Status: Abnormal   Collection Time: 07/10/20  5:41 AM  Result Value Ref Range   Sodium 135 135 - 145 mmol/L   Potassium 4.3 3.5 - 5.1 mmol/L   Chloride 106 98 - 111 mmol/L   CO2 21 (L) 22 - 32 mmol/L   Glucose, Bld 148 (H) 70 - 99 mg/dL    Comment: Glucose reference range applies only to samples taken after fasting for at least 8 hours.   BUN 12 6 - 20 mg/dL   Creatinine, Ser 1.20 (H) 0.44 - 1.00 mg/dL   Calcium 8.1 (L) 8.9 -  10.3 mg/dL   GFR, Estimated 58 (L) >60 mL/min    Comment: (NOTE) Calculated using the CKD-EPI Creatinine Equation (2021)    Anion gap 8 5 - 15    Comment: Performed at [facility], [place] [address] [place], [place] [address]  CK     Status: Abnormal   Collection Time: 07/10/20  5:41 AM  Result Value Ref Range   Total CK 14 (L) 38.0 - 234.0 U/L    Comment: Performed at [facility], [place] [address] [place], [place] [address]  Sedimentation rate     Status: Abnormal   Collection Time: 07/10/20  5:41 AM  Result Value Ref Range   Sed  Rate 118 (H) 0 - 22 mm/hr    Comment: Performed at [facility], [place] [address] [place], [place] [address]  C-reactive protein     Status: Abnormal   Collection Time: 07/10/20  5:41 AM  Result Value Ref Range   CRP 29.1 (H) <1.0 mg/dL    Comment: Performed at [facility], [place] [address], [place], [place] [address]  CBC     Status: Abnormal   Collection Time: 07/10/20  5:41 AM  Result Value Ref Range   WBC 15.8 (H) 4.0 - 10.5 K/uL   RBC 3.01 (L) 3.87 - 5.11 MIL/uL   Hemoglobin 8.1 (L) 12.0 - 15.0 g/dL   HCT 25.4 (L) 36 - 46 %   MCV 84.4 80.0 - 100.0 fL   MCH 26.9 26.0 - 34.0 pg   MCHC 31.9 30.0 - 36.0 g/dL   RDW 24.6 (H) 11.5 - 15.5 %   Platelets 138 (L) 150 - 400 K/uL   nRBC 0.1 0.0 - 0.2 %    Comment: Performed at [facility], [place] [address] [place], [place] [address]  CBC with Differential/Platelet     Status: Abnormal   Collection Time: 07/10/20  5:27 PM  Result Value Ref Range   WBC 15.9 (H) 4.0 - 10.5 K/uL   RBC 2.76 (L) 3.87 - 5.11 MIL/uL   Hemoglobin 7.7 (L) 12.0 - 15.0 g/dL   HCT 23.3 (L) 36 - 46 %   MCV 84.4 80.0 - 100.0 fL   MCH 27.9 26.0 - 34.0 pg   MCHC 33.0 30.0 - 36.0 g/dL   RDW 24.2 (H) 11.5 - 15.5 %   Platelets 130 (L) 150 - 400 K/uL   nRBC 0.2 0.0 - 0.2 %   Neutrophils Relative % 82 %   Neutro Abs 13.0 (H) 1.7 - 7.7 K/uL    Lymphocytes Relative 7 %   Lymphs Abs 1.2 0.7 - 4.0 K/uL   Monocytes Relative 9 %   Monocytes Absolute 1.4 (H) 0.1 - 1.0 K/uL   Eosinophils Relative 0 %   Eosinophils Absolute 0.0 0.0 - 0.5 K/uL   Basophils Relative 0 %   Basophils Absolute 0.0 0.0 - 0.1 K/uL   Immature Granulocytes 2 %   Abs Immature Granulocytes 0.33 (H) 0.00 - 0.07 K/uL   Polychromasia PRESENT    Target Cells PRESENT     Comment: Performed at [facility], [place] [address], [place], [place] [address]  Comprehensive metabolic panel     Status: Abnormal   Collection Time: 07/10/20  5:27 PM  Result Value Ref Range   Sodium 135 135 - 145 mmol/L   Potassium 3.7 3.5 - 5.1 mmol/L   Chloride 106 98 - 111 mmol/L   CO2 18 (L) 22 - 32 mmol/L   Glucose, Bld 145 (H) 70 - 99 mg/dL    Comment: Glucose reference range applies only to samples taken after fasting for at least 8 hours.   BUN 15 6 - 20 mg/dL   Creatinine, Ser 1.11 (H) 0.44 - 1.00 mg/dL   Calcium 7.7 (L) 8.9 - 10.3 mg/dL   Total Protein 5.1 (L) 6.5 - 8.1 g/dL   Albumin 1.7 (L) 3.5 - 5.0 g/dL   AST 50 (H) 15 - 41 U/L   ALT 31 0 - 44 U/L   Alkaline Phosphatase 208 (H) 38 - 126 U/L   Total Bilirubin 4.9 (H) 0.3 - 1.2 mg/dL   GFR, Estimated >60 >  60 mL/min    Comment: (NOTE) Calculated using the CKD-EPI Creatinine Equation (2021)    Anion gap 11 5 - 15    Comment: Performed at [facility], [place] [address], [place], [place] [address]  Magnesium     Status: Abnormal   Collection Time: 07/10/20  5:27 PM  Result Value Ref Range   Magnesium 1.3 (L) 1.7 - 2.4 mg/dL    Comment: Performed at [facility], [place] [address], [place], [place] [address]  Phosphorus     Status: Abnormal   Collection Time: 07/10/20  5:27 PM  Result Value Ref Range   Phosphorus 2.3 (L) 2.5 - 4.6 mg/dL    Comment: Performed at [facility], [place] [address], [place], [place] [address]  Ammonia     Status: None    Collection Time: 07/10/20  5:27 PM  Result Value Ref Range   Ammonia 24 9 - 35 umol/L    Comment: Performed at [facility], [place] [address] [place], [place] [address]  Urinalysis, Routine w reflex microscopic Urine, Catheterized     Status: Abnormal   Collection Time: 07/10/20  5:54 PM  Result Value Ref Range   Color, Urine AMBER (A) YELLOW    Comment: BIOCHEMICALS MAY BE AFFECTED BY COLOR   APPearance CLEAR CLEAR   Specific Gravity, Urine 1.016 1.005 - 1.030   pH 6.0 5.0 - 8.0   Glucose, UA NEGATIVE NEGATIVE mg/dL   Hgb urine dipstick NEGATIVE NEGATIVE   Bilirubin Urine SMALL (A) NEGATIVE   Ketones, ur NEGATIVE NEGATIVE mg/dL   Protein, ur 30 (A) NEGATIVE mg/dL   Nitrite NEGATIVE NEGATIVE   Leukocytes,Ua NEGATIVE NEGATIVE   RBC / HPF 0-5 0 - 5 RBC/hpf   WBC, UA 0-5 0 - 5 WBC/hpf   Bacteria, UA RARE (A) NONE SEEN   Mucus PRESENT    Hyaline Casts, UA PRESENT    Granular Casts, UA PRESENT     Comment: Performed at [facility], [place] [address] [place], [place] [address]  Comprehensive metabolic panel     Status: Abnormal   Collection Time: 07/11/20  4:11 AM  Result Value Ref Range   Sodium 137 135 - 145 mmol/L   Potassium 4.2 3.5 - 5.1 mmol/L   Chloride 105 98 - 111 mmol/L   CO2 20 (L) 22 - 32 mmol/L   Glucose, Bld 109 (H) 70 - 99 mg/dL    Comment: Glucose reference range applies only to samples taken after fasting for at least 8 hours.   BUN 15 6 - 20 mg/dL   Creatinine, Ser 1.16 (H) 0.44 - 1.00 mg/dL   Calcium 7.8 (L) 8.9 - 10.3 mg/dL   Total Protein 5.5 (L) 6.5 - 8.1 g/dL   Albumin 1.9 (L) 3.5 - 5.0 g/dL   AST 53 (H) 15 - 41 U/L   ALT 32 0 - 44 U/L   Alkaline Phosphatase 214 (H) 38 - 126 U/L   Total Bilirubin 5.2 (H) 0.3 - 1.2 mg/dL   GFR, Estimated 60 (L) >60 mL/min    Comment: (NOTE) Calculated using the CKD-EPI Creatinine Equation (2021)    Anion gap 12 5 - 15    Comment: Performed at [facility], [place]  [address], [place], [place] [address]  CBC with Differential/Platelet     Status: Abnormal   Collection Time: 07/11/20  4:11 AM  Result Value Ref Range   WBC 21.4 (H) 4.0 -  10.5 K/uL   RBC 2.90 (L) 3.87 - 5.11 MIL/uL   Hemoglobin 8.0 (L) 12.0 - 15.0 g/dL   HCT 24.2 (L) 36 - 46 %   MCV 83.4 80.0 - 100.0 fL   MCH 27.6 26.0 - 34.0 pg   MCHC 33.1 30.0 - 36.0 g/dL   RDW 24.6 (H) 11.5 - 15.5 %   Platelets 146 (L) 150 - 400 K/uL   nRBC 0.1 0.0 - 0.2 %   Neutrophils Relative % 86 %   Neutro Abs 18.4 (H) 1.7 - 7.7 K/uL   Lymphocytes Relative 6 %   Lymphs Abs 1.3 0.7 - 4.0 K/uL   Monocytes Relative 7 %   Monocytes Absolute 1.4 (H) 0.1 - 1.0 K/uL   Eosinophils Relative 0 %   Eosinophils Absolute 0.0 0.0 - 0.5 K/uL   Basophils Relative 0 %   Basophils Absolute 0.0 0.0 - 0.1 K/uL   Immature Granulocytes 1 %   Abs Immature Granulocytes 0.29 (H) 0.00 - 0.07 K/uL   Polychromasia PRESENT    Target Cells PRESENT     Comment: Performed at [facility], [place] [address] [place], [place] [address]  Phosphorus     Status: None   Collection Time: 07/11/20  4:11 AM  Result Value Ref Range   Phosphorus 2.8 2.5 - 4.6 mg/dL    Comment: Performed at [facility], [place] [address], [place], [place] [address]  Magnesium     Status: Abnormal   Collection Time: 07/11/20  4:11 AM  Result Value Ref Range   Magnesium 2.6 (H) 1.7 - 2.4 mg/dL    Comment: Performed at [facility], [place] [address], [place], [place] [address]   CT HEAD WO CONTRAST  Result Date: 07/10/2020 CLINICAL DATA:  Delirium, history of urologic malignancy. EXAM: CT HEAD WITHOUT CONTRAST TECHNIQUE: Contiguous axial images were obtained from the base of the skull through the vertex without intravenous contrast. COMPARISON:  MRI 06/28/2020 FINDINGS: Brain: No evidence of acute infarction, hemorrhage, hydrocephalus, extra-axial collection, mass effect or visible mass lesion 4 within the  limitations of an unenhanced CT exam. Basal cisterns are patent. Midline intracranial structures are normal. Cerebellar tonsils are normally positioned. Vascular: No hyperdense vessel or unexpected calcification. Skull: Few prominent venous lakes. No convincing focal sclerotic or lytic osseous metastatic disease. A previously seen C2 vertebral body metastasis is not within the margins of imaging. Scalp soft tissues are unremarkable. Sinuses/Orbits: Paranasal sinuses and mastoid air cells are predominantly clear. Included orbital structures are unremarkable. Other: None IMPRESSION: 1. No acute intracranial findings. No visible metastatic lesions within the limitations of unenhanced CT imaging. 2. A previously seen C2 vertebral body metastasis is not within the margins of imaging. Electronically Signed   By: Lovena Le M.D.   On: 07/10/2020 19:59   DG CHEST PORT 1 VIEW  Result Date: 07/11/2020 CLINICAL DATA:  Shortness of breath EXAM: PORTABLE CHEST 1 VIEW COMPARISON:  07/10/2020 FINDINGS: Hazy opacity of the left more than right lower chest. No visible effusion or pneumothorax. Normal heart size and mediastinal contours. IMPRESSION: Stable low volume chest with infiltrates or atelectasis at the bases. Electronically Signed   By: Monte Fantasia M.D.   On: 07/11/2020 07:39   DG CHEST PORT 1 VIEW  Result Date: 07/10/2020 CLINICAL DATA:  Liver carcinoma.  Hypertension. EXAM: PORTABLE CHEST 1 VIEW COMPARISON:  June 26, 2020 FINDINGS: There is consolidation in the left lower lobe with small left pleural effusion. There  is ill-defined airspace opacity in the right base. Heart size and pulmonary vascularity are normal. No adenopathy. There is aortic atherosclerosis. No bone lesions. IMPRESSION: Consolidation left lower lobe with small left pleural effusion. Patchy infiltrate right base. Heart size within normal limits. No adenopathy evident. Aortic Atherosclerosis (ICD10-I70.0). Electronically Signed   By:  Lowella Grip III M.D.   On: 07/10/2020 13:48    Assessment: 50 y.o. female ***  Plan: 1. Awaiting results of today's MRI brain 2. Awaiting results of EEG.   07/11/2020, 2:10 PM

---

## 2000-04-05 ENCOUNTER — Inpatient Hospital Stay (HOSPITAL_COMMUNITY): Admission: AD | Admit: 2000-04-05 | Discharge: 2000-04-05 | Payer: Self-pay | Admitting: Obstetrics

## 2000-04-08 ENCOUNTER — Inpatient Hospital Stay (HOSPITAL_COMMUNITY): Admission: EM | Admit: 2000-04-08 | Discharge: 2000-04-08 | Payer: Self-pay | Admitting: Obstetrics & Gynecology

## 2000-04-15 ENCOUNTER — Encounter: Payer: Self-pay | Admitting: Obstetrics & Gynecology

## 2000-04-15 ENCOUNTER — Inpatient Hospital Stay (HOSPITAL_COMMUNITY): Admission: AD | Admit: 2000-04-15 | Discharge: 2000-04-15 | Payer: Self-pay | Admitting: Obstetrics & Gynecology

## 2000-04-17 ENCOUNTER — Inpatient Hospital Stay (HOSPITAL_COMMUNITY): Admission: AD | Admit: 2000-04-17 | Discharge: 2000-04-17 | Payer: Self-pay | Admitting: *Deleted

## 2000-04-27 ENCOUNTER — Encounter: Payer: Self-pay | Admitting: *Deleted

## 2000-04-27 ENCOUNTER — Inpatient Hospital Stay (HOSPITAL_COMMUNITY): Admission: AD | Admit: 2000-04-27 | Discharge: 2000-04-27 | Payer: Self-pay | Admitting: *Deleted

## 2000-05-02 ENCOUNTER — Inpatient Hospital Stay (HOSPITAL_COMMUNITY): Admission: AD | Admit: 2000-05-02 | Discharge: 2000-05-02 | Payer: Self-pay | Admitting: Obstetrics & Gynecology

## 2000-05-06 ENCOUNTER — Inpatient Hospital Stay (HOSPITAL_COMMUNITY): Admission: AD | Admit: 2000-05-06 | Discharge: 2000-05-06 | Payer: Self-pay | Admitting: Obstetrics & Gynecology

## 2000-05-15 ENCOUNTER — Inpatient Hospital Stay (HOSPITAL_COMMUNITY): Admission: AD | Admit: 2000-05-15 | Discharge: 2000-05-15 | Payer: Self-pay | Admitting: *Deleted

## 2000-05-16 ENCOUNTER — Inpatient Hospital Stay (HOSPITAL_COMMUNITY): Admission: AD | Admit: 2000-05-16 | Discharge: 2000-05-16 | Payer: Self-pay | Admitting: Obstetrics

## 2000-06-01 ENCOUNTER — Inpatient Hospital Stay (HOSPITAL_COMMUNITY): Admission: AD | Admit: 2000-06-01 | Discharge: 2000-06-01 | Payer: Self-pay | Admitting: Obstetrics & Gynecology

## 2000-07-08 ENCOUNTER — Inpatient Hospital Stay (HOSPITAL_COMMUNITY): Admission: AD | Admit: 2000-07-08 | Discharge: 2000-07-08 | Payer: Self-pay | Admitting: Obstetrics & Gynecology

## 2001-01-17 ENCOUNTER — Inpatient Hospital Stay (HOSPITAL_COMMUNITY): Admission: AD | Admit: 2001-01-17 | Discharge: 2001-01-17 | Payer: Self-pay | Admitting: Obstetrics

## 2001-05-05 ENCOUNTER — Encounter: Admission: RE | Admit: 2001-05-05 | Discharge: 2001-05-05 | Payer: Self-pay | Admitting: Family Medicine

## 2001-05-27 ENCOUNTER — Encounter: Admission: RE | Admit: 2001-05-27 | Discharge: 2001-05-27 | Payer: Self-pay | Admitting: Family Medicine

## 2001-06-01 ENCOUNTER — Encounter: Admission: RE | Admit: 2001-06-01 | Discharge: 2001-06-01 | Payer: Self-pay | Admitting: Family Medicine

## 2001-06-01 ENCOUNTER — Other Ambulatory Visit: Admission: RE | Admit: 2001-06-01 | Discharge: 2001-06-01 | Payer: Self-pay | Admitting: Family Medicine

## 2001-06-10 ENCOUNTER — Ambulatory Visit (HOSPITAL_COMMUNITY): Admission: RE | Admit: 2001-06-10 | Discharge: 2001-06-10 | Payer: Self-pay | Admitting: Obstetrics

## 2001-07-04 ENCOUNTER — Encounter: Admission: RE | Admit: 2001-07-04 | Discharge: 2001-07-04 | Payer: Self-pay | Admitting: Family Medicine

## 2001-07-06 ENCOUNTER — Encounter: Admission: RE | Admit: 2001-07-06 | Discharge: 2001-07-06 | Payer: Self-pay | Admitting: Family Medicine

## 2001-07-25 ENCOUNTER — Encounter: Admission: RE | Admit: 2001-07-25 | Discharge: 2001-07-25 | Payer: Self-pay | Admitting: Family Medicine

## 2001-08-11 ENCOUNTER — Encounter: Admission: RE | Admit: 2001-08-11 | Discharge: 2001-08-11 | Payer: Self-pay | Admitting: Obstetrics

## 2001-08-11 ENCOUNTER — Encounter: Admission: RE | Admit: 2001-08-11 | Discharge: 2001-08-11 | Payer: Self-pay | Admitting: Family Medicine

## 2001-08-25 ENCOUNTER — Encounter: Admission: RE | Admit: 2001-08-25 | Discharge: 2001-08-25 | Payer: Self-pay | Admitting: Obstetrics

## 2001-09-02 ENCOUNTER — Ambulatory Visit (HOSPITAL_COMMUNITY): Admission: RE | Admit: 2001-09-02 | Discharge: 2001-09-02 | Payer: Self-pay | Admitting: *Deleted

## 2001-09-08 ENCOUNTER — Encounter: Admission: RE | Admit: 2001-09-08 | Discharge: 2001-09-08 | Payer: Self-pay | Admitting: Obstetrics

## 2001-09-28 ENCOUNTER — Encounter: Admission: RE | Admit: 2001-09-28 | Discharge: 2001-09-28 | Payer: Self-pay | Admitting: *Deleted

## 2001-10-05 ENCOUNTER — Encounter: Admission: RE | Admit: 2001-10-05 | Discharge: 2001-10-05 | Payer: Self-pay | Admitting: *Deleted

## 2001-10-06 ENCOUNTER — Inpatient Hospital Stay (HOSPITAL_COMMUNITY): Admission: AD | Admit: 2001-10-06 | Discharge: 2001-10-09 | Payer: Self-pay | Admitting: *Deleted

## 2001-11-18 ENCOUNTER — Encounter: Admission: RE | Admit: 2001-11-18 | Discharge: 2001-11-18 | Payer: Self-pay | Admitting: Family Medicine

## 2002-07-06 ENCOUNTER — Inpatient Hospital Stay (HOSPITAL_COMMUNITY): Admission: AD | Admit: 2002-07-06 | Discharge: 2002-07-06 | Payer: Self-pay | Admitting: *Deleted

## 2002-12-15 ENCOUNTER — Encounter: Admission: RE | Admit: 2002-12-15 | Discharge: 2002-12-15 | Payer: Self-pay | Admitting: Family Medicine

## 2002-12-15 ENCOUNTER — Other Ambulatory Visit: Admission: RE | Admit: 2002-12-15 | Discharge: 2002-12-15 | Payer: Self-pay | Admitting: Family Medicine

## 2003-01-15 ENCOUNTER — Inpatient Hospital Stay (HOSPITAL_COMMUNITY): Admission: AD | Admit: 2003-01-15 | Discharge: 2003-01-15 | Payer: Self-pay | Admitting: Obstetrics and Gynecology

## 2003-01-18 ENCOUNTER — Encounter: Admission: RE | Admit: 2003-01-18 | Discharge: 2003-01-18 | Payer: Self-pay | Admitting: Family Medicine

## 2003-02-05 ENCOUNTER — Encounter: Admission: RE | Admit: 2003-02-05 | Discharge: 2003-02-05 | Payer: Self-pay | Admitting: Family Medicine

## 2003-03-06 ENCOUNTER — Encounter: Admission: RE | Admit: 2003-03-06 | Discharge: 2003-03-06 | Payer: Self-pay | Admitting: Nephrology

## 2003-03-06 ENCOUNTER — Encounter: Payer: Self-pay | Admitting: Nephrology

## 2003-03-28 ENCOUNTER — Encounter: Admission: RE | Admit: 2003-03-28 | Discharge: 2003-03-28 | Payer: Self-pay | Admitting: Family Medicine

## 2003-09-03 ENCOUNTER — Encounter: Admission: RE | Admit: 2003-09-03 | Discharge: 2003-09-03 | Payer: Self-pay | Admitting: Family Medicine

## 2003-12-11 ENCOUNTER — Encounter: Admission: RE | Admit: 2003-12-11 | Discharge: 2003-12-11 | Payer: Self-pay | Admitting: Family Medicine

## 2003-12-15 ENCOUNTER — Inpatient Hospital Stay (HOSPITAL_COMMUNITY): Admission: AD | Admit: 2003-12-15 | Discharge: 2003-12-15 | Payer: Self-pay | Admitting: Obstetrics & Gynecology

## 2003-12-15 IMAGING — US US OB COMP LESS 14 WK
1 series · 17 of 28 positions shown · non-contrast
Comparison: none

CLINICAL DATA: Bleeding.  Pelvic pain.

[Series 1: us ob comp<14 wk · 17 of 64 slices shown]
[im 1/64]
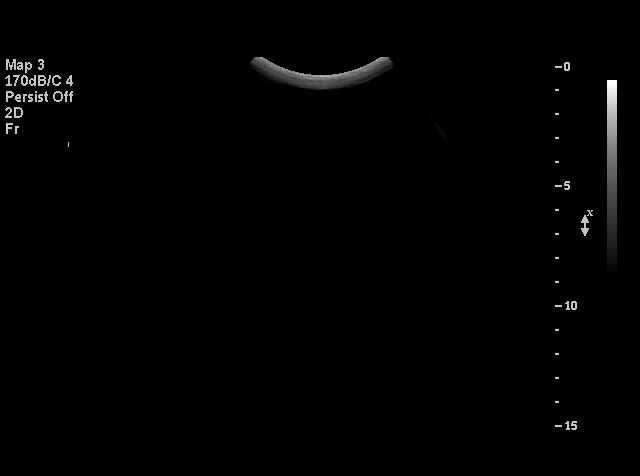
[im 5/64]
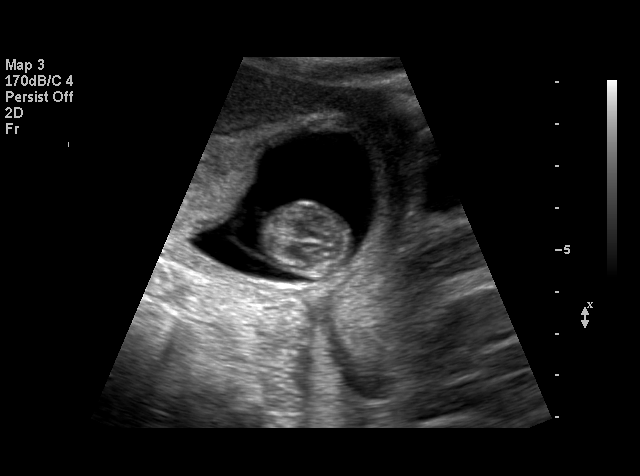
[im 10/64]
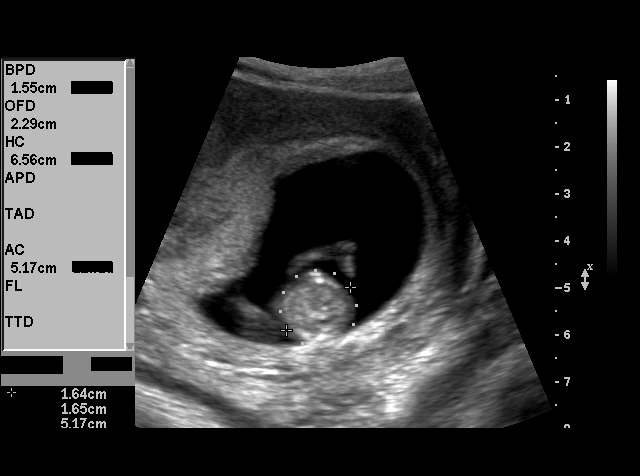
[im 12/64]
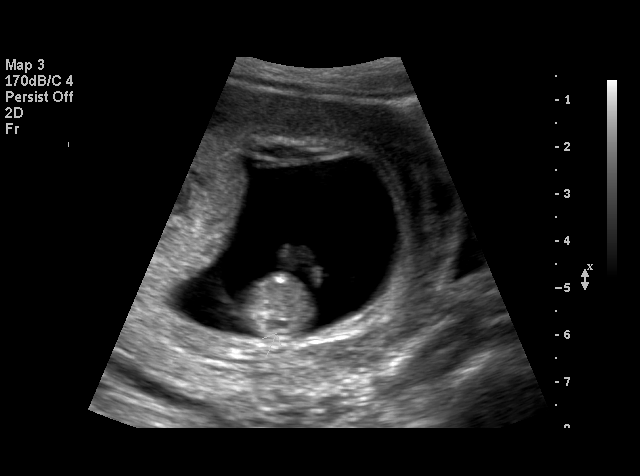
[im 17/64]
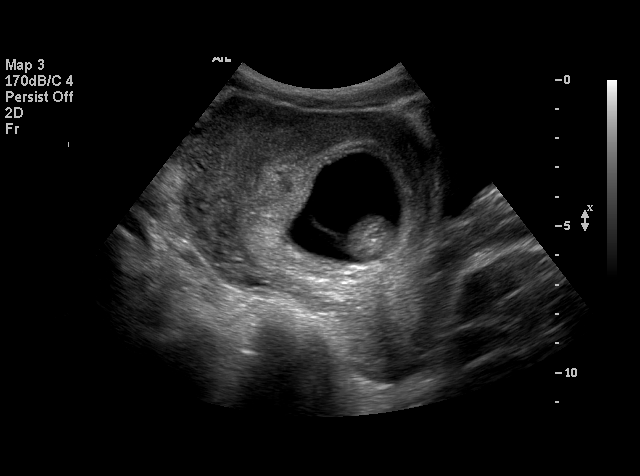
[im 22/64]
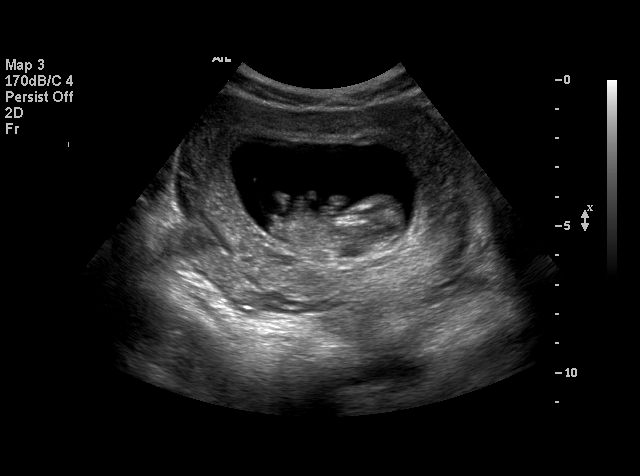
[im 24/64]
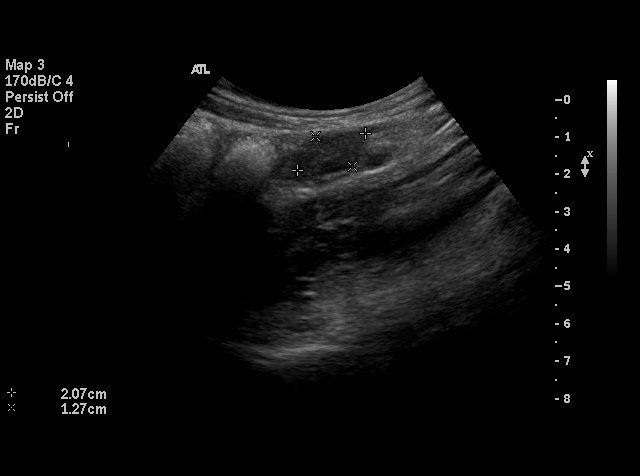
[im 29/64]
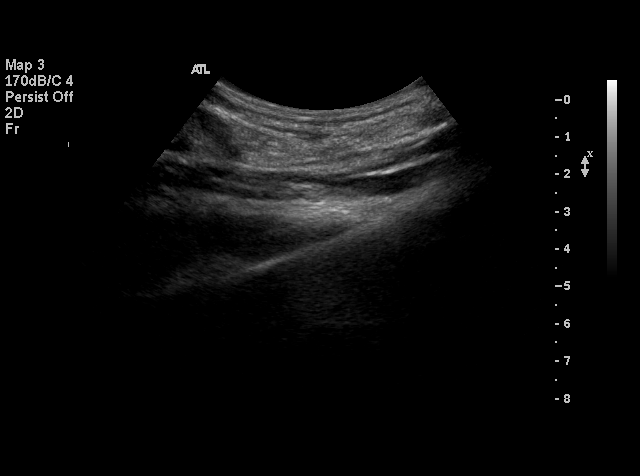
[im 33/64]
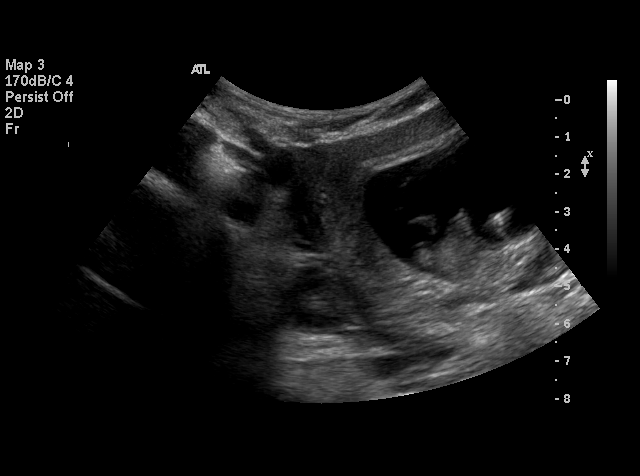
[im 36/64]
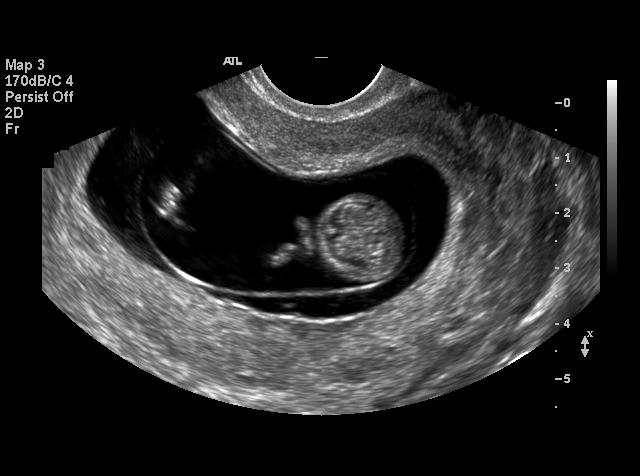
[im 40/64]
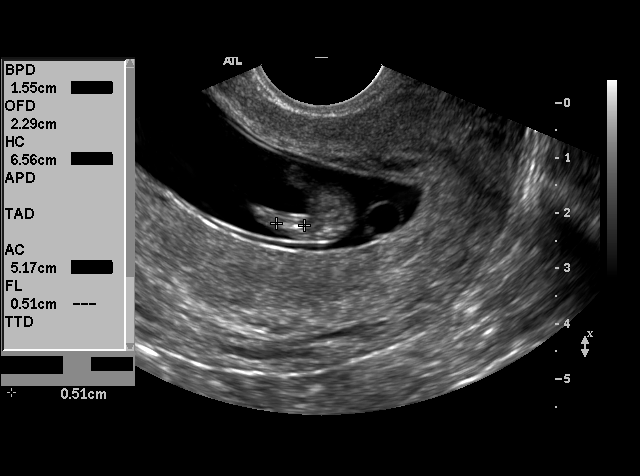
[im 43/64]
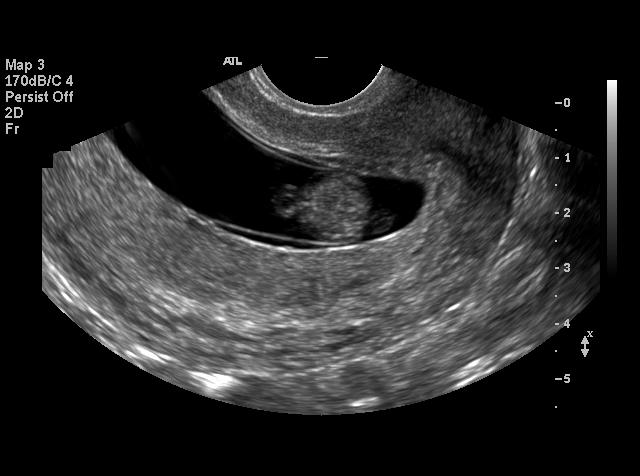
[im 47/64]
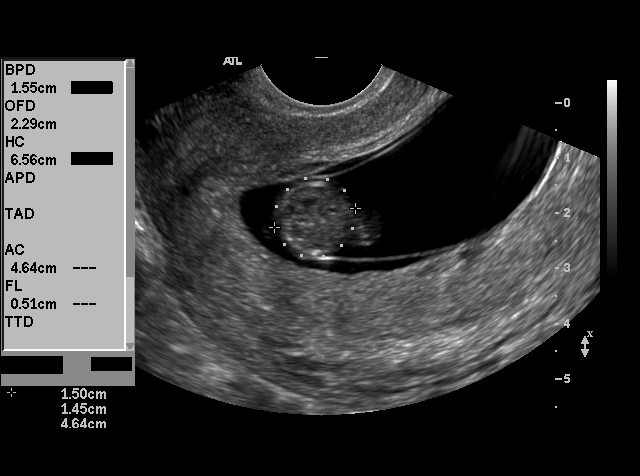
[im 52/64]
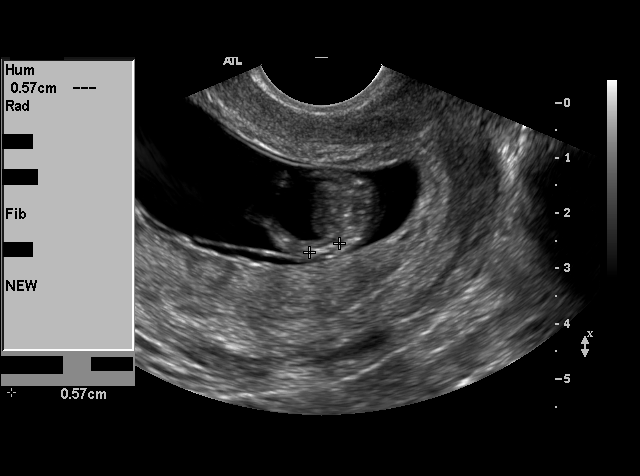
[im 54/64]
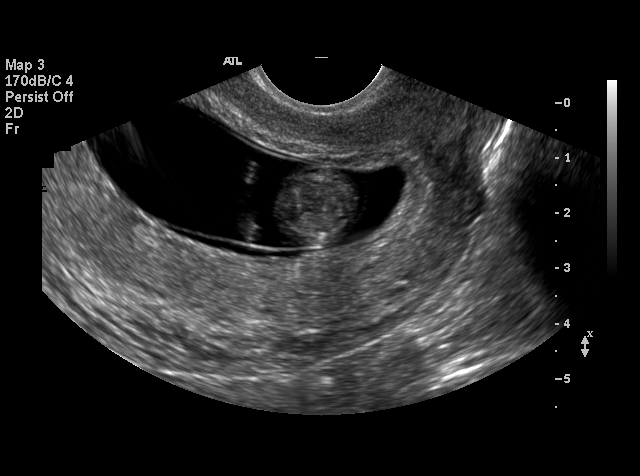
[im 59/64]
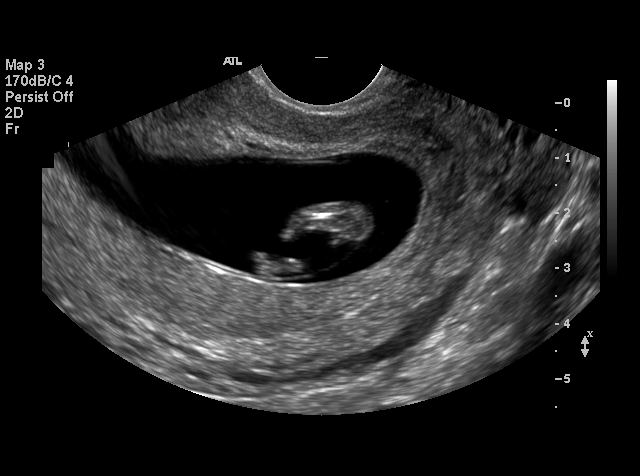
[im 64/64]
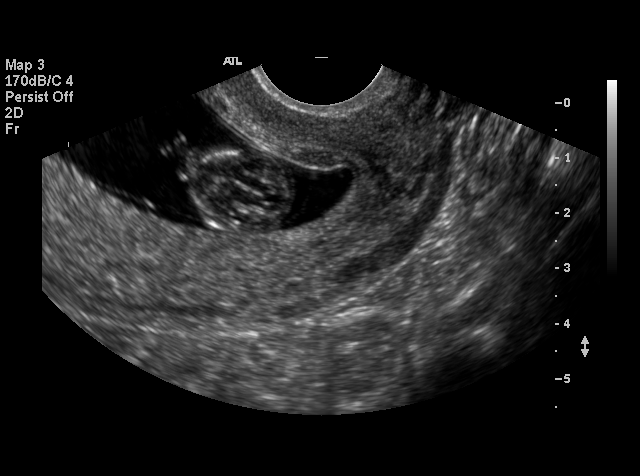

[17 of 28 positions shown; findings below may reference images not displayed]

OBSTETRICAL ULTRASOUND

Number of Fetuses: 1
Heart Rate: 171 BPM
Movement: Yes
Breathing:  No  
Amniotic Fluid (Subjective): Normal

FETAL BIOMETRY
CRL: 4.9 cm, 11W 5D  
BPD: 1.6 cm, 12W 2D  
HC: 6.6 cm, 12W 5D  
AC: 4.8 cm, 12W 0D
FL:  0.6 cm  
MEAN GA: 12W 2D  

Fetal anatomy could not be evaluated due to the early gestational age, except for the stomach.  The upper and lower extremities appear somewhat shortened and femurs and humeri are difficult to visualize clearly.  uggestion of abnormal orientation of the feet in relation to the lower extremities, but not definite.  There is a midline abdominal protrusion which appears to contain some bowel and suggestive of an omphalocele, but given estimated gestation of near 12 weeks, physiologic herniation is not entirely excluded.  There is also some fluid within the chest, likely pleural effusions.  The remainder of fetal anatomy is difficult to evaluate secondary to patient?s early gestational age.

MATERNAL FINDINGS
IMPRESSION: Single living intrauterine gestation with estimated gestational age of 11 weeks 5 days by this ultrasound.
Fetal anatomy as described, very limited by early gestational age, with following findings:
-Midline abdominal protrusion with suggestion of some contained bowel.  This likely represents omphalocele, especially with associated findings, but physiologic herniation is not excluded at this age.  
  -Question somewhat shortened extremities which are not well evaluated with suggestion of abnormal orientation of the feet to the lower legs.  
  -Suggestion of fluid within the chest/pleural effusions.  
The above findings are worrisome for chromosomal abnormality and recommend interval follow-up in 2 to 4 weeks for further evaluation and better delineation of fetal anatomy.

## 2003-12-18 ENCOUNTER — Encounter: Admission: RE | Admit: 2003-12-18 | Discharge: 2003-12-18 | Payer: Self-pay | Admitting: Sports Medicine

## 2003-12-26 ENCOUNTER — Encounter: Admission: RE | Admit: 2003-12-26 | Discharge: 2003-12-26 | Payer: Self-pay | Admitting: *Deleted

## 2004-01-03 ENCOUNTER — Encounter: Admission: RE | Admit: 2004-01-03 | Discharge: 2004-01-03 | Payer: Self-pay | Admitting: Sports Medicine

## 2004-01-15 ENCOUNTER — Ambulatory Visit (HOSPITAL_COMMUNITY): Admission: RE | Admit: 2004-01-15 | Discharge: 2004-01-15 | Payer: Self-pay | Admitting: *Deleted

## 2004-01-15 IMAGING — US US OB COMP +14 WK
1 series · 13 of 28 positions shown · non-contrast
Comparison: none

CLINICAL DATA: 28 year old female at 16 weeks gestation.  Question abdominal wall defect on previous ultrasound exam.

[Series 1: unknown · 0.23mm/px · 13 of 56 slices shown]
[im 3/56]
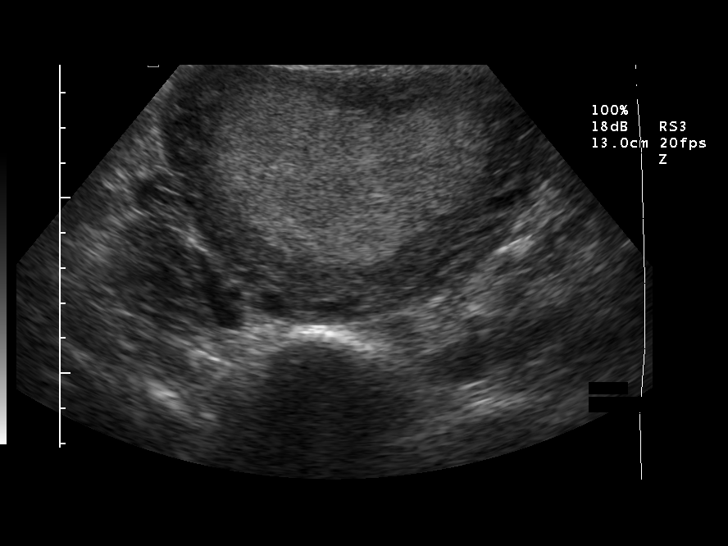
[im 7/56]
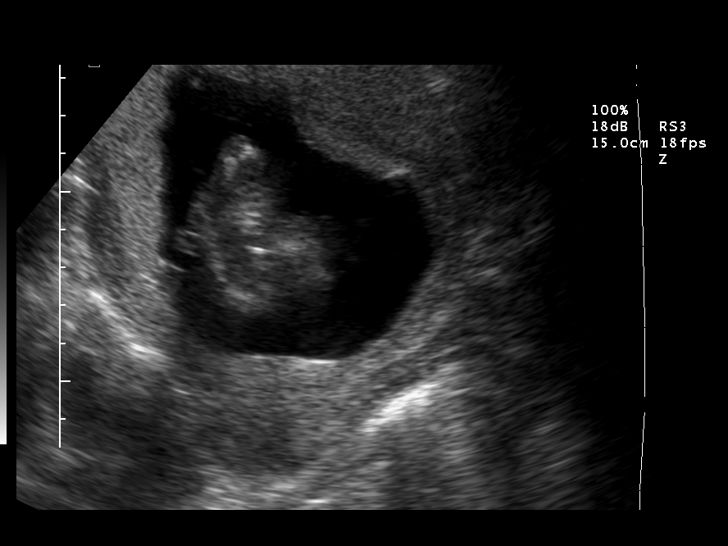
[im 11/56]
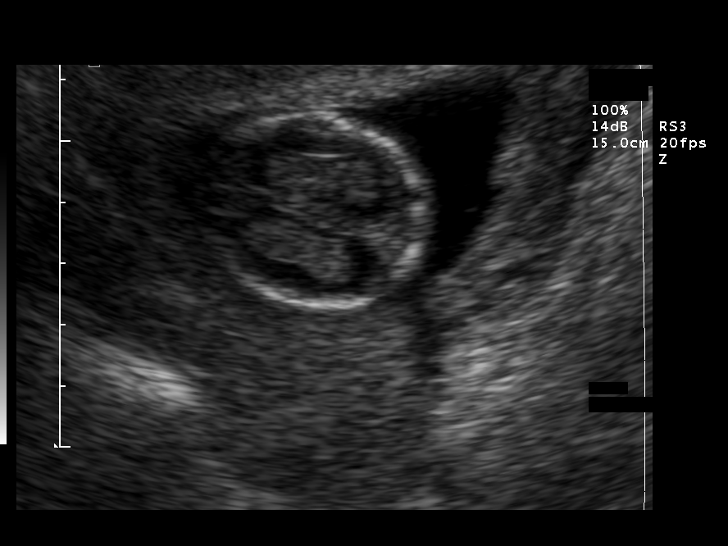
[im 15/56]
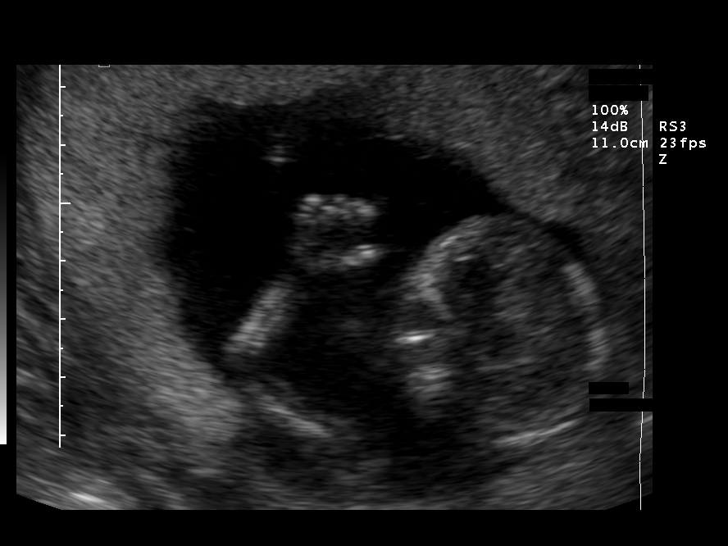
[im 19/56]
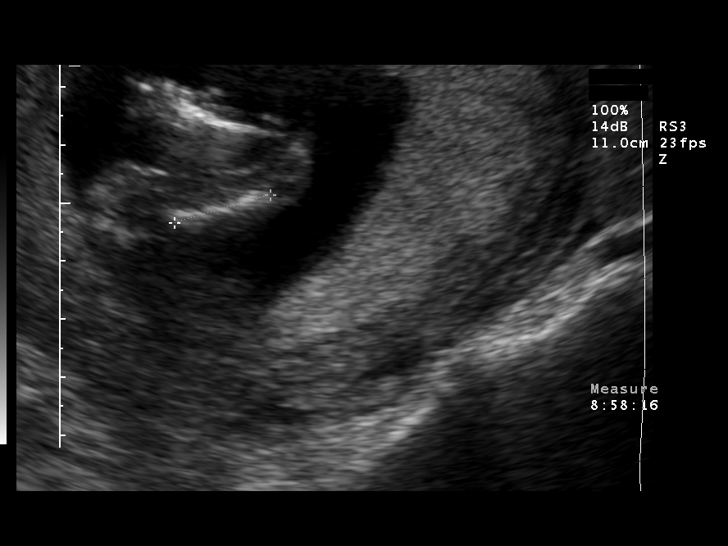
[im 23/56]
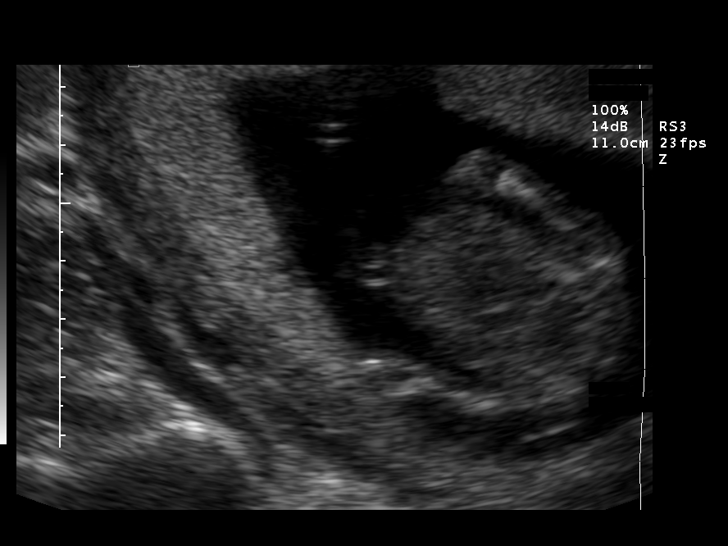
[im 29/56]
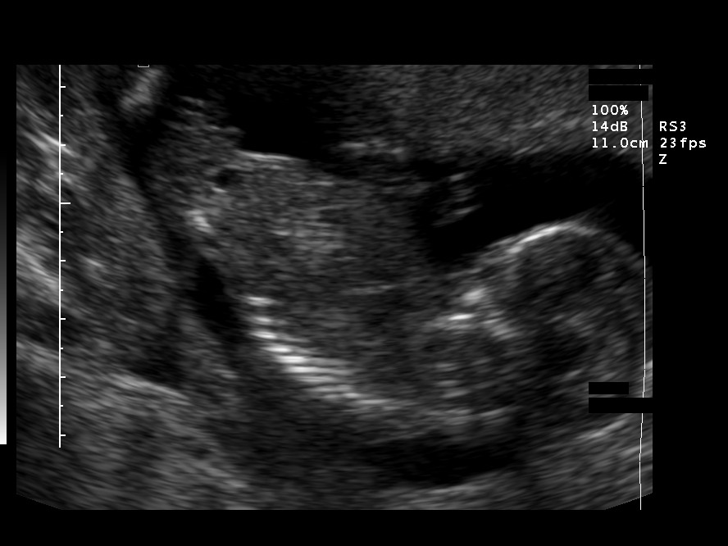
[im 33/56]
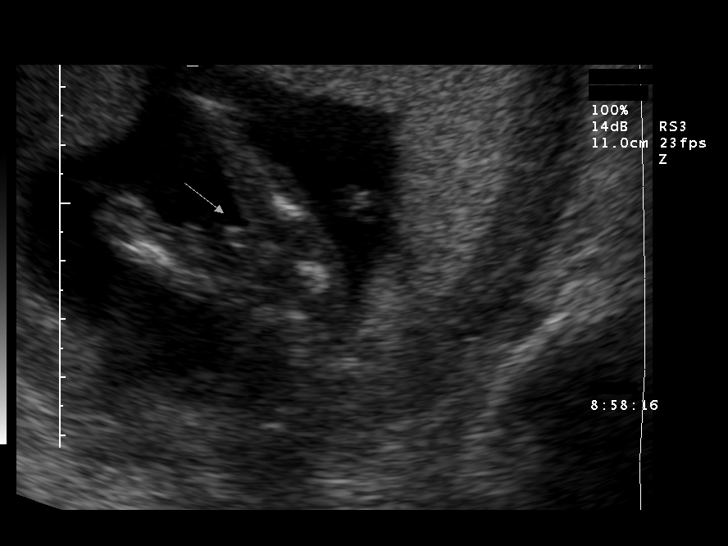
[im 37/56]
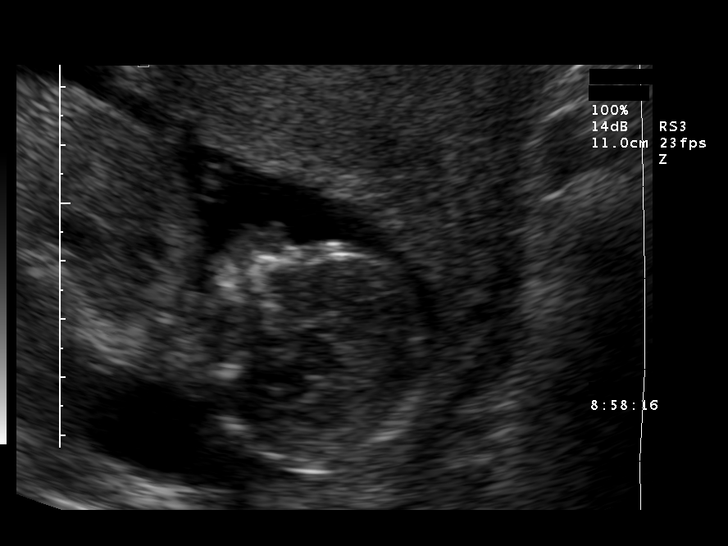
[im 41/56]
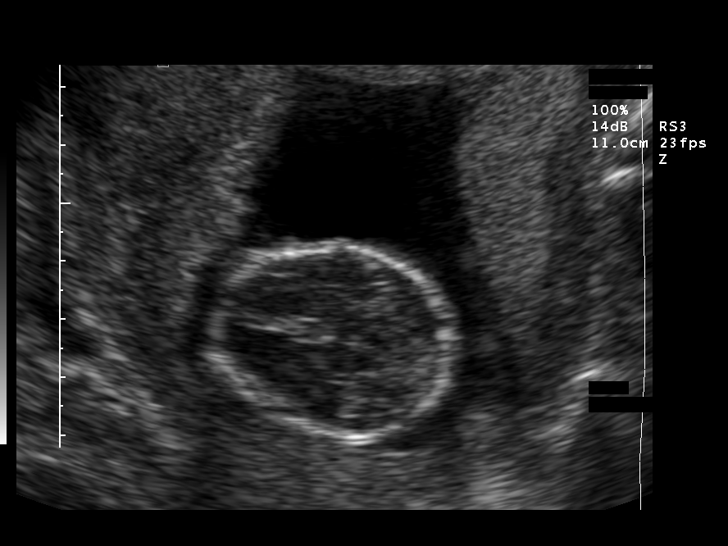
[im 45/56]
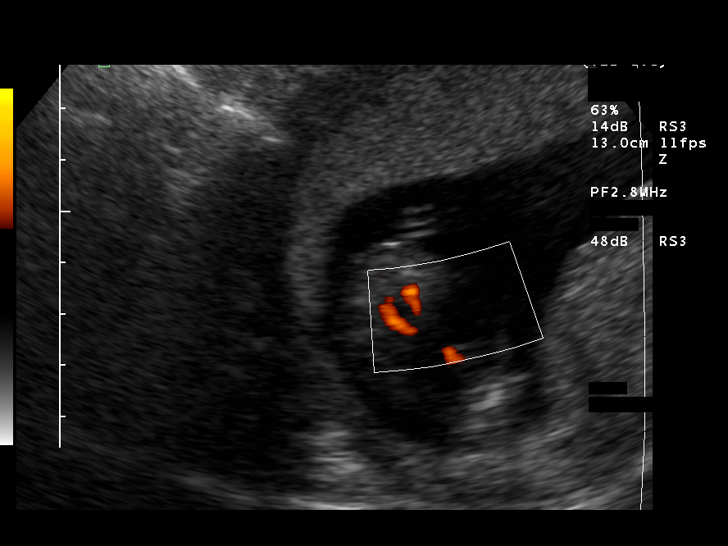
[im 49/56]
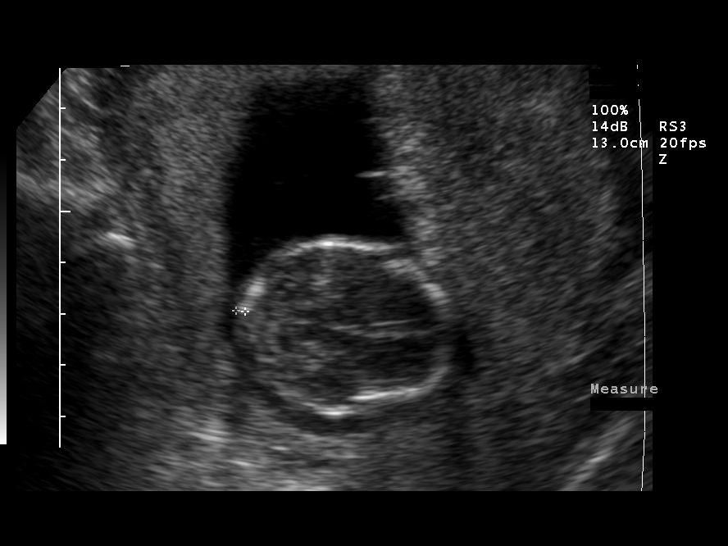
[im 53/56]
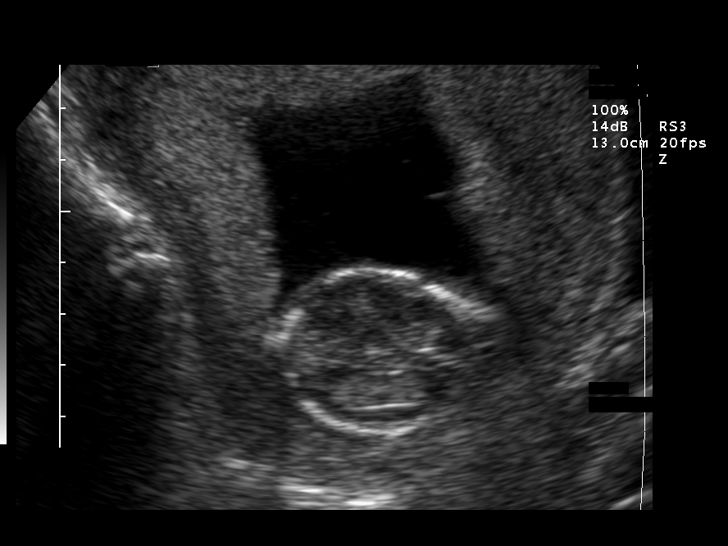

[13 of 28 positions shown; findings below may reference images not displayed]

OBSTETRICAL ULTRASOUND
 Comparison [DATE].
 Number of Fetuses:  1
 Heart Rate:  Not recorded
 Movement:  Yes
 Breathing:  No  
 Presentation:  Cephalic 
 Placental Location:  Fundal
 Grade:  I
 Previa:  No
 Amniotic Fluid (Subjective):  Normal
 Amniotic Fluid (Objective):   4.1 cm Vertical pocket 

 FETAL BIOMETRY
 BPD:   3.3 cm   16 w 1 d
 HC:   12.2 cm  16 w 1 d
 AC:   10.4 cm   16 w 3 d
 FL:    1.7 cm   15 w 1 d

 MEAN GA:  16 w 0 d

 FETAL ANATOMY
 Lateral Ventricles:    Visualized 
 Thalami/CSP:      Visualized 
 Posterior Fossa:  Visualized 
 Nuchal Region:    Visualized 
 Spine:      Not visualized 
 4 Chamber Heart on Left:      Not visualized 
 Stomach on Left:      Visualized 
 3 Vessel Cord:    Visualized 
 Cord Insertion site:    Visualized 
 Kidneys:  Visualized 
 Bladder:  Visualized 
 Extremities:      Visualized 

 ADDITIONAL ANATOMY VISUALIZED:  Orbits, profile, heel, 5th digit, and male genitalia 

 MATERNAL FINDINGS
 Cervix:   3.0 cm Transabdominally
IMPRESSION: Single living intrauterine fetus in cephalic presentation with subjectively normal amniotic fluid volume.  Gestational age by ultrasound today is 16 weeks 0 days which correlates closely with the gestational age by LMP, but is 5 days behind the gestational age estimated by first ultrasound.  
 Femur length continues to lag behind the other fetal indices.  There is no evidence for anterior abdominal wall defect on today?s exam to suggest the presence of an omphalocele.  
 Repeat sonographic assessment at 18 weeks is recommended for dedicated study of anatomy.

 </u12:p>

## 2004-02-08 ENCOUNTER — Encounter: Admission: RE | Admit: 2004-02-08 | Discharge: 2004-02-08 | Payer: Self-pay | Admitting: Family Medicine

## 2004-02-15 ENCOUNTER — Ambulatory Visit (HOSPITAL_COMMUNITY): Admission: RE | Admit: 2004-02-15 | Discharge: 2004-02-15 | Payer: Self-pay | Admitting: *Deleted

## 2004-02-15 IMAGING — US US OB FOLLOW-UP
1 series · 6 of 6 positions shown · non-contrast
Comparison: none

CLINICAL DATA: Evaluate anatomy.

[Series 1: unknown · 0.18mm/px · 6 of 6 slices shown]
[im 1/6]
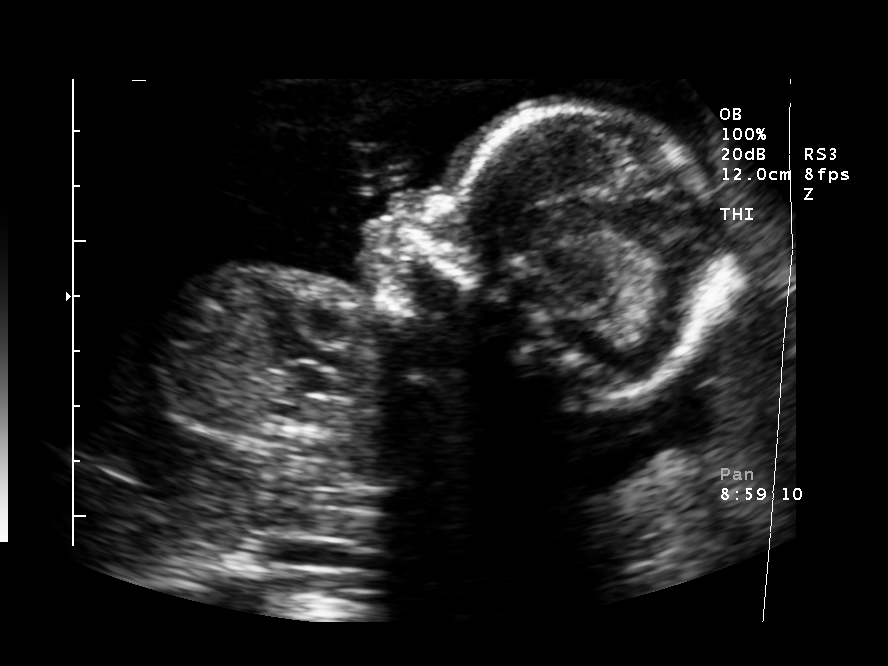
[im 2/6]
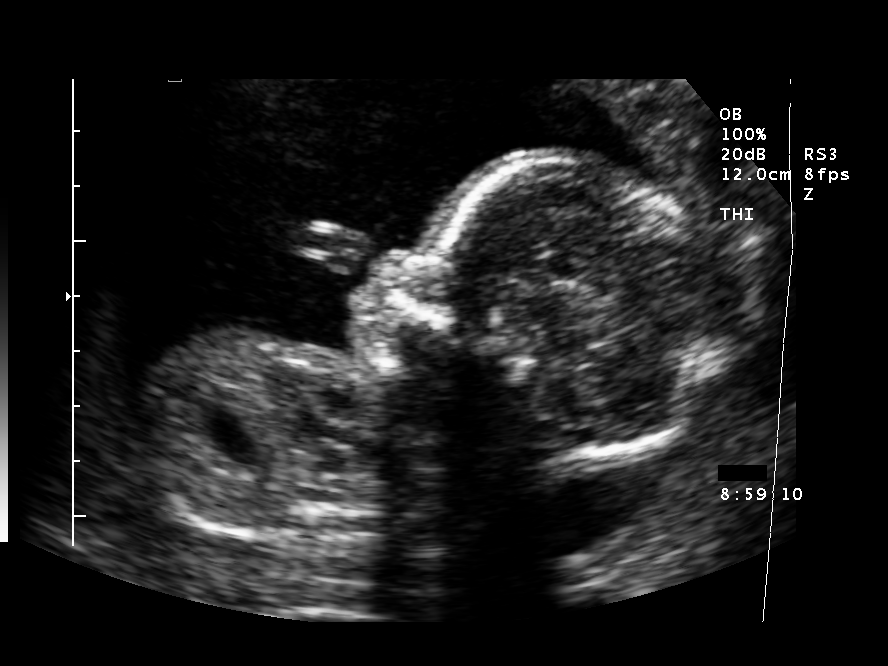
[im 3/6]
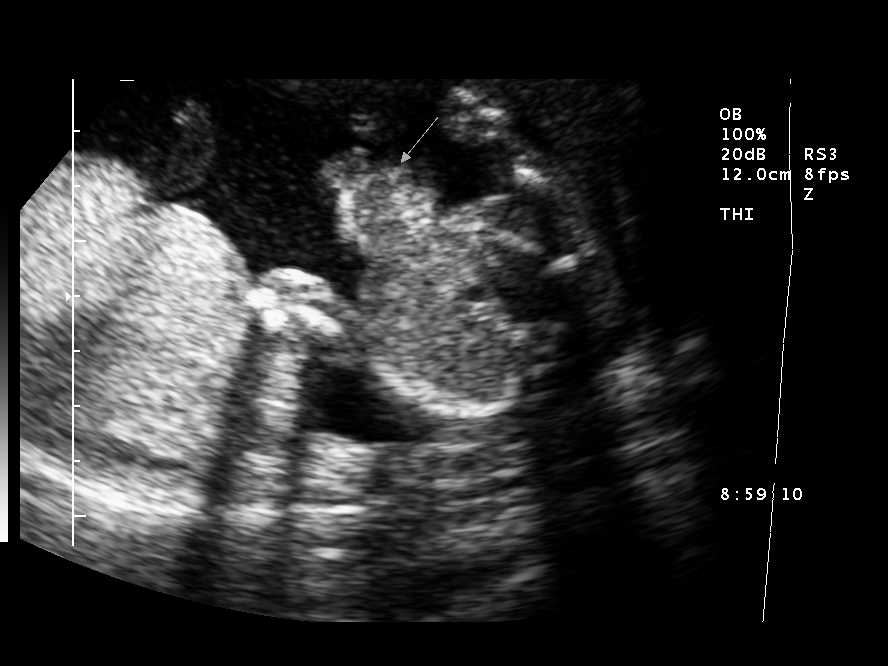
[im 4/6]
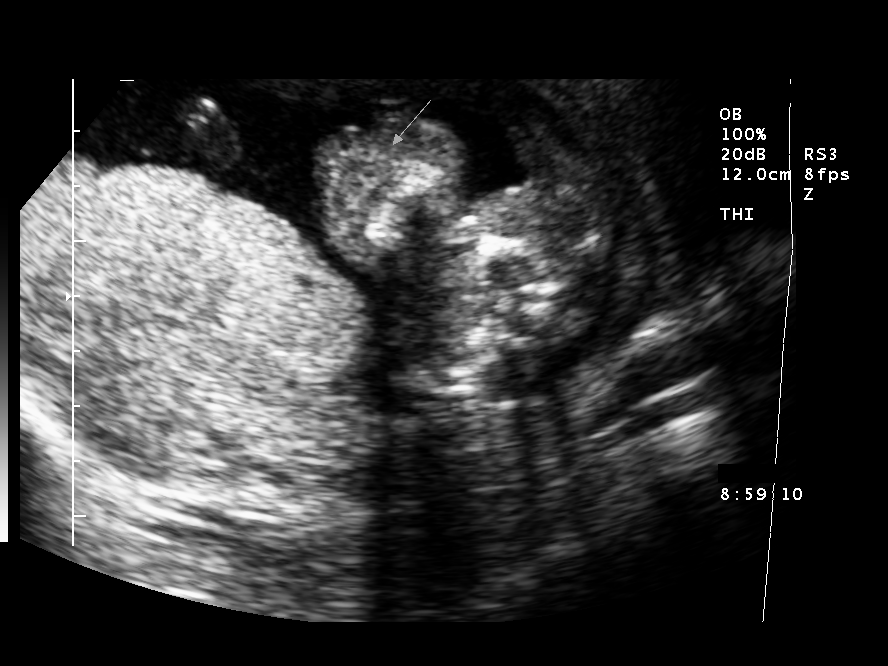
[im 5/6]
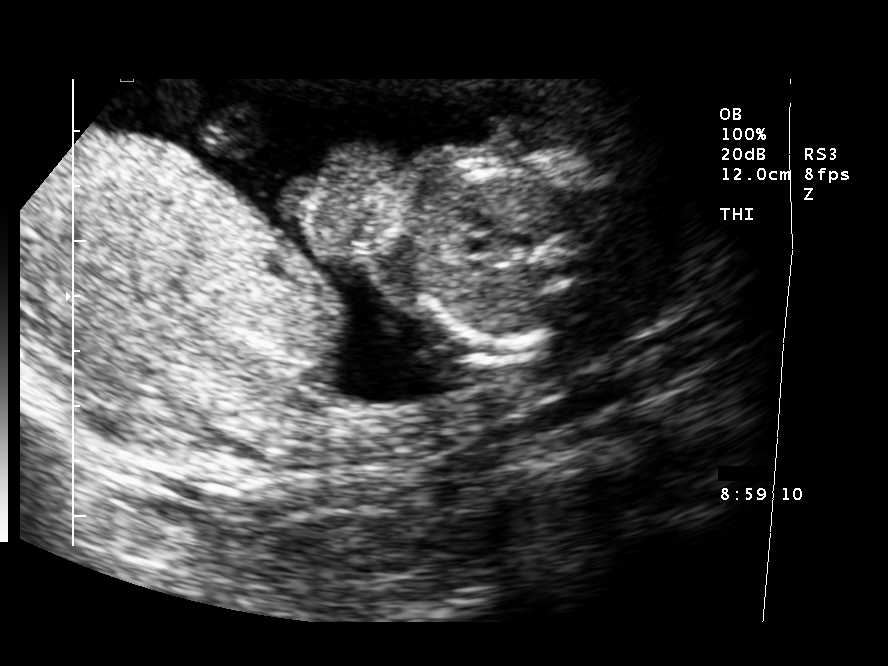
[im 6/6]
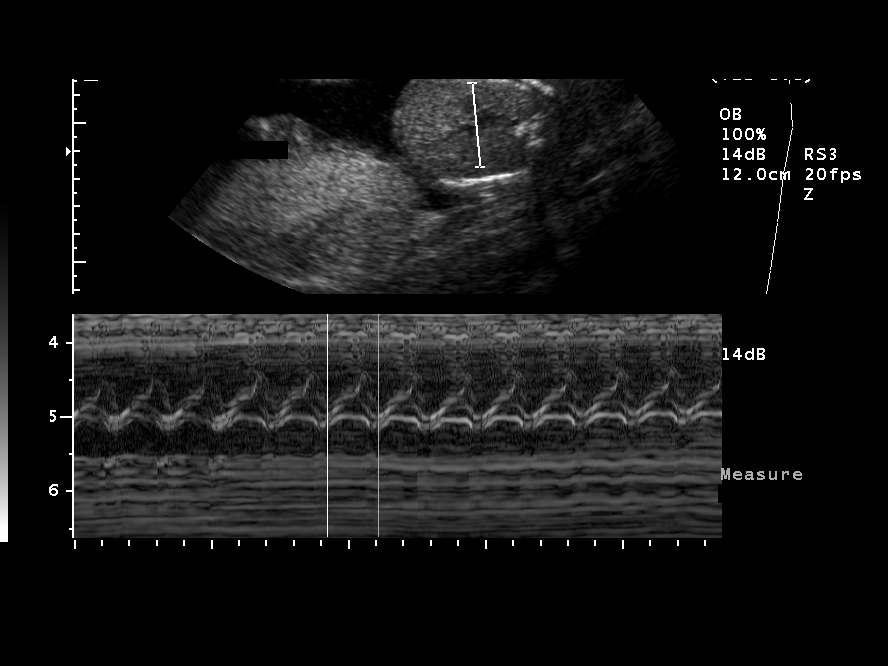

[6 of 6 positions shown; findings below may reference images not displayed]

OBSTETRICAL ULTRASOUND RE-EVALUATION
 Number of Fetuses:  1
 Heart Rate:  162
 Movement:  Yes
 Breathing:  No
 Presentation:  Cephalic
 Placental Location:  Posterior
 Grade:  I
 Previa:  No
 Amniotic Fluid (subjective):  Normal
 Amniotic Fluid (objective):  5.2 cm Vertical pocket 

 FETAL BIOMETRY
 BPD:  4.7 cm   20 w 2 d
 HC:  17.6 cm  20 w 1 d
 AC:  14.9 cm   20 w 1 d
 FL:   3.2 cm  19 w 6 d

 Mean GA:  20 w 1 d
 Assigned GA:  20 w 4 d

 FETAL ANATOMY
 Lateral Ventricles:  Visualized 
 Thalami/CSP:  Visualized 
 Posterior Fossa:  Visualized 
 Nuchal Region:  Previously seen 
 Spine:  Visualized 
 4 Chamber Heart on Left:  Visualized 
 Stomach on Left:  Visualized 
 3 Vessel Cord:  Visualized 
 Cord Insertion Site:  Visualized 
 Kidneys:  Visualized 
 Bladder:  Visualized 
 Extremities:  Previously seen   

 ADDITIONAL ANATOMY VISUALIZED:  LVOT, RVOT, upper lip, orbits, diaphragm, 5th digit, ductal arch, aortic arch, and male genitalia

 MATERNAL FINDINGS
 Cervix:  3.0 cm Transabdominally
IMPRESSION: Single living intrauterine fetus in cephalic presentation with subjectively and quantitatively normal amniotic fluid volume.
 Mean gestational age today is 20 weeks 1 day consistent with appropriate interval growth since the last ultrasound.  
 Visualized fetal anatomy is unremarkable.

## 2004-02-15 IMAGING — US US OB FOLLOW-UP
1 series · 13 of 28 positions shown · non-contrast
Comparison: none

CLINICAL DATA: Evaluate anatomy.

[Series 1: unknown · 0.28mm/px · 13 of 64 slices shown]
[im 3/64]
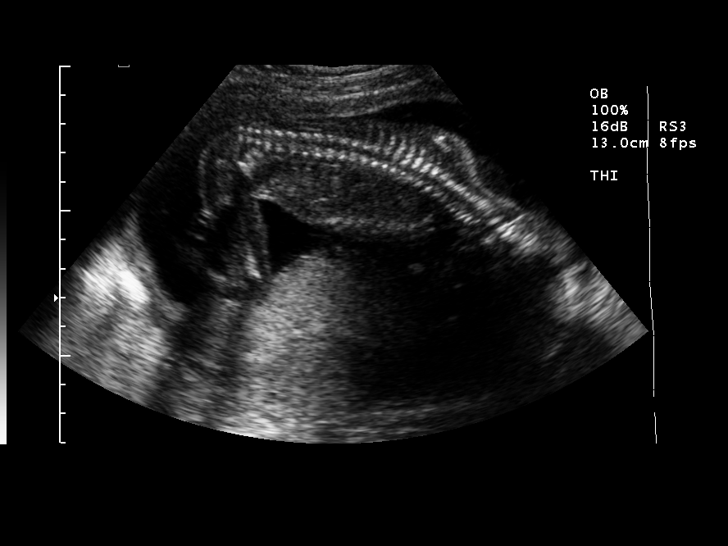
[im 8/64]
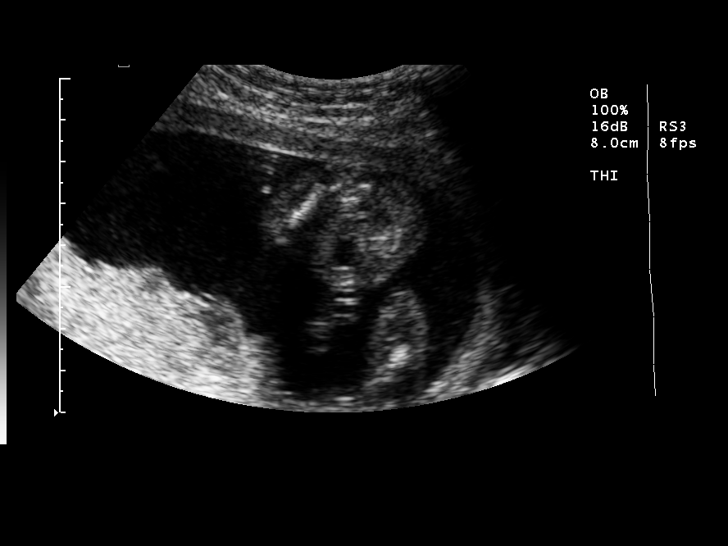
[im 12/64]
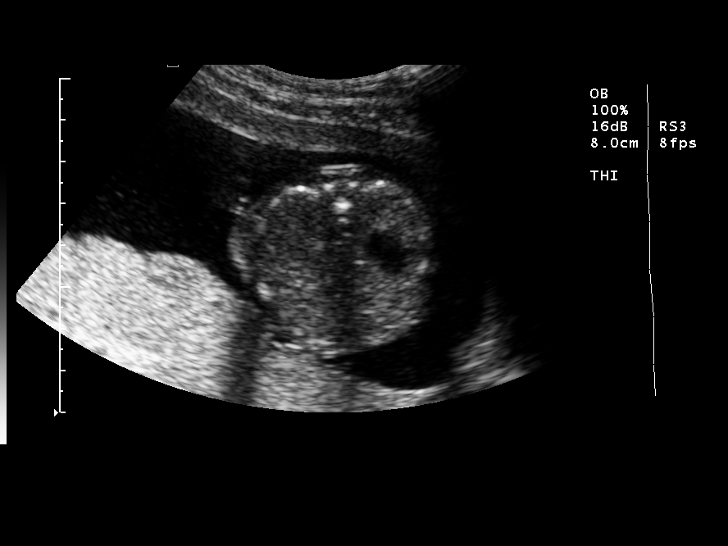
[im 17/64]
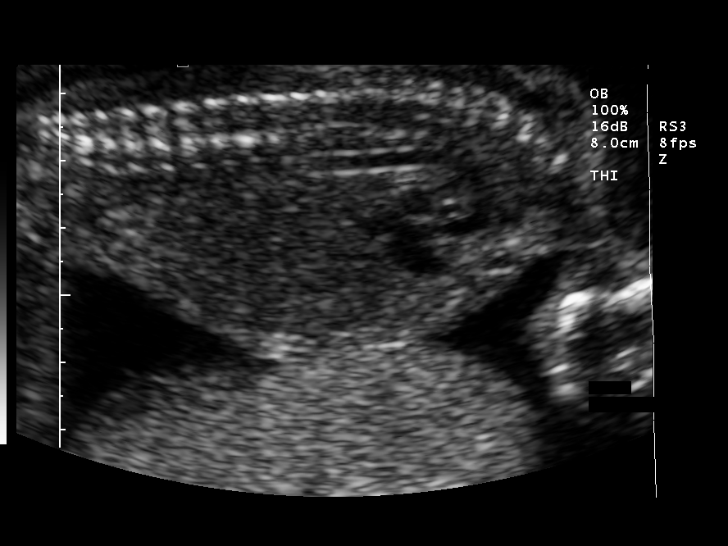
[im 22/64]
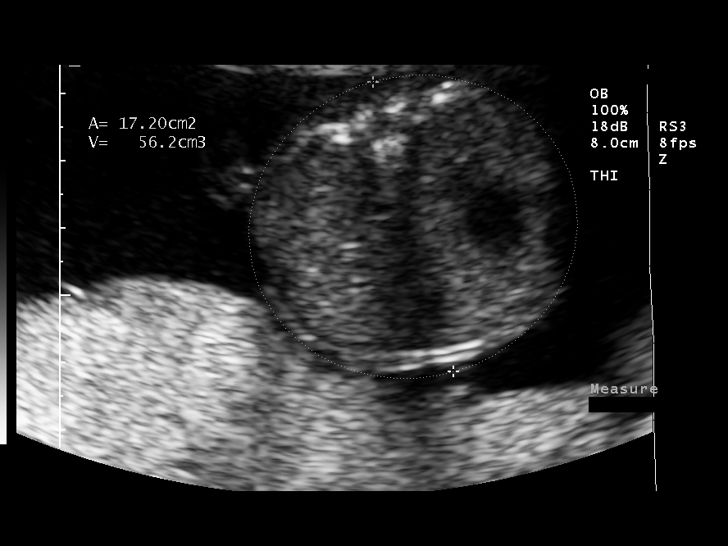
[im 26/64]
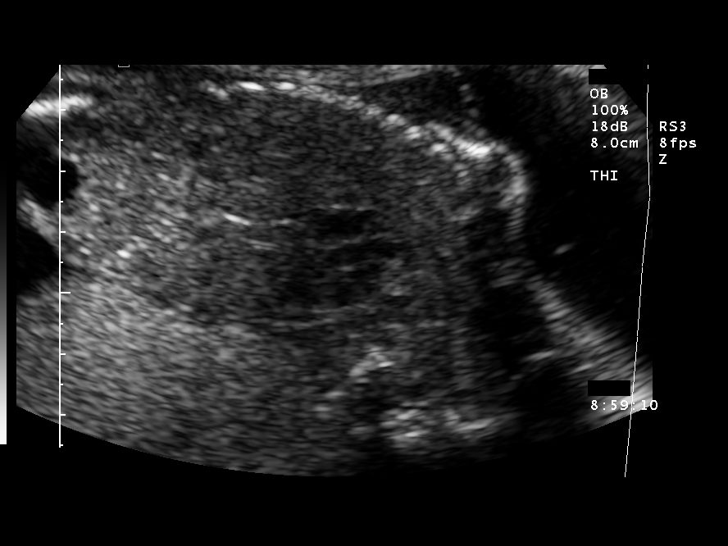
[im 33/64]
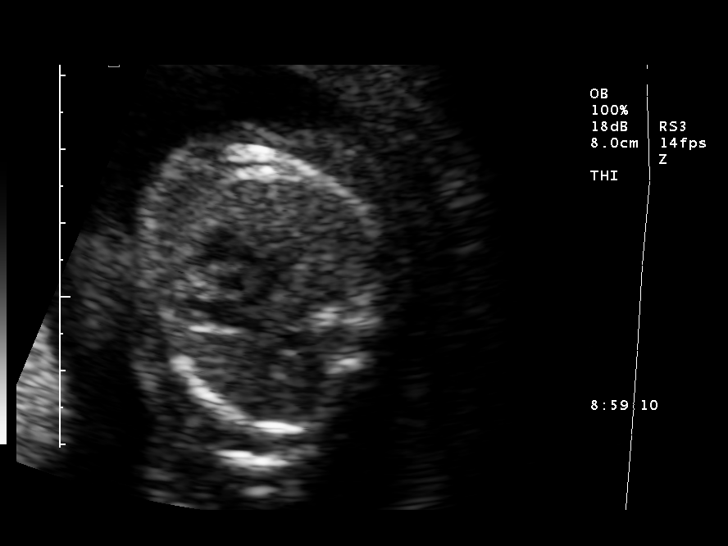
[im 38/64]
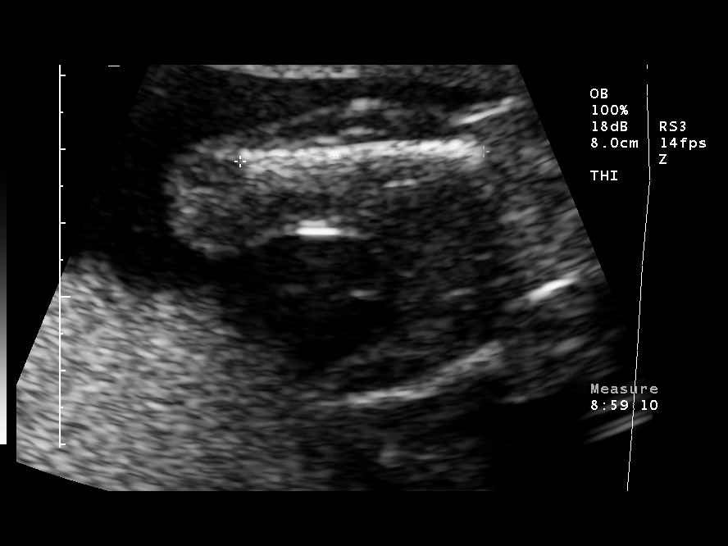
[im 43/64]
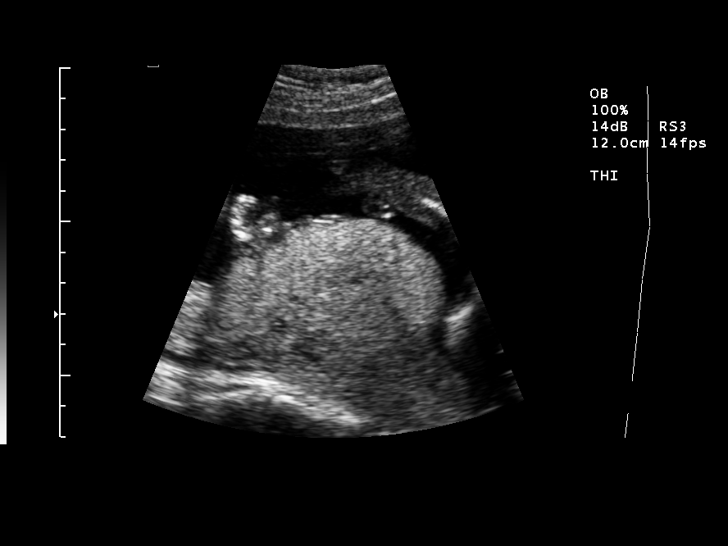
[im 47/64]
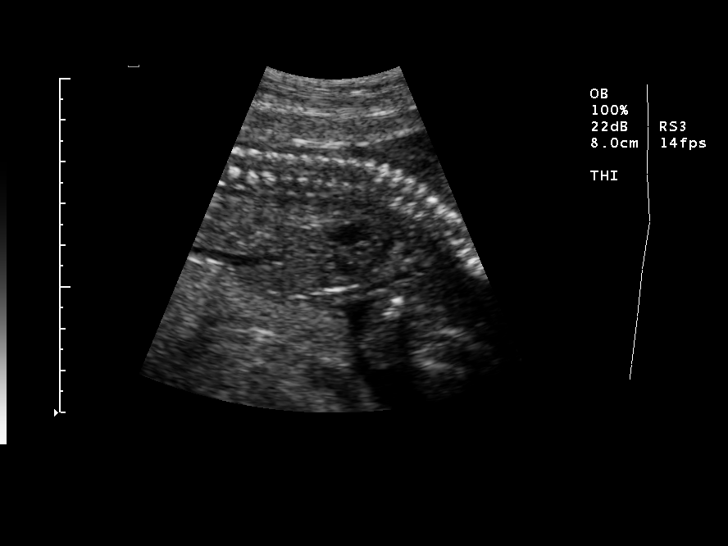
[im 52/64]
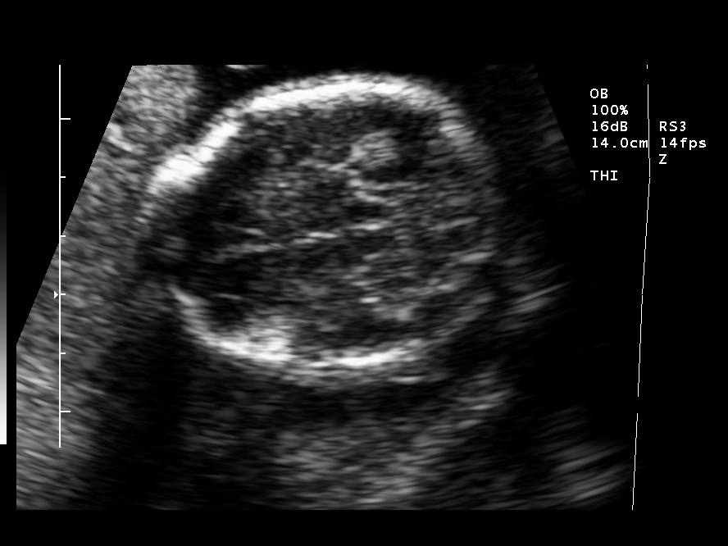
[im 57/64]
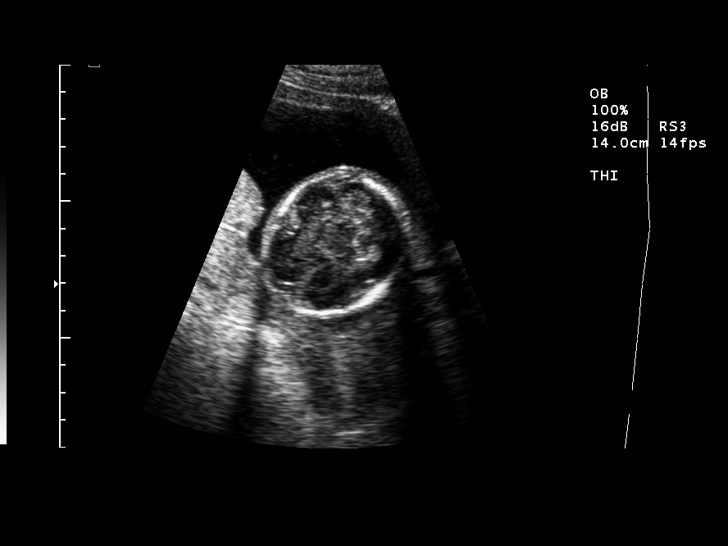
[im 61/64]
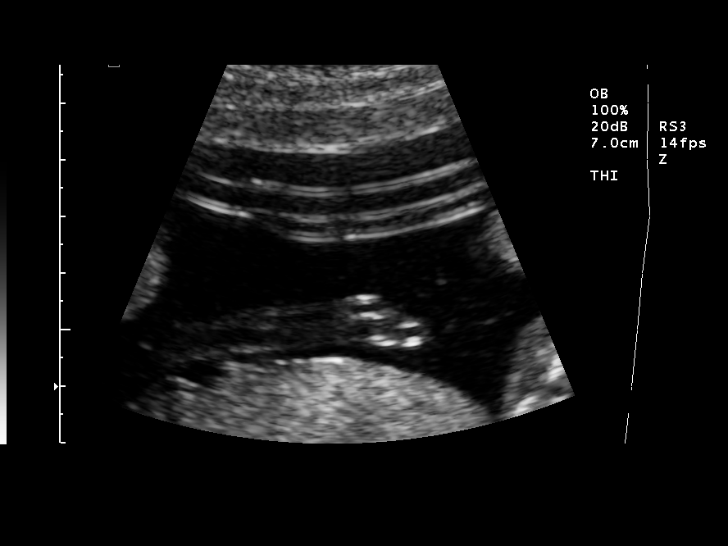

[13 of 28 positions shown; findings below may reference images not displayed]

OBSTETRICAL ULTRASOUND RE-EVALUATION
 Number of Fetuses:  1
 Heart Rate:  162
 Movement:  Yes
 Breathing:  No
 Presentation:  Cephalic
 Placental Location:  Posterior
 Grade:  I
 Previa:  No
 Amniotic Fluid (subjective):  Normal
 Amniotic Fluid (objective):  5.2 cm Vertical pocket 

 FETAL BIOMETRY
 BPD:  4.7 cm   20 w 2 d
 HC:  17.6 cm  20 w 1 d
 AC:  14.9 cm   20 w 1 d
 FL:   3.2 cm  19 w 6 d

 Mean GA:  20 w 1 d
 Assigned GA:  20 w 4 d

 FETAL ANATOMY
 Lateral Ventricles:  Visualized 
 Thalami/CSP:  Visualized 
 Posterior Fossa:  Visualized 
 Nuchal Region:  Previously seen 
 Spine:  Visualized 
 4 Chamber Heart on Left:  Visualized 
 Stomach on Left:  Visualized 
 3 Vessel Cord:  Visualized 
 Cord Insertion Site:  Visualized 
 Kidneys:  Visualized 
 Bladder:  Visualized 
 Extremities:  Previously seen   

 ADDITIONAL ANATOMY VISUALIZED:  LVOT, RVOT, upper lip, orbits, diaphragm, 5th digit, ductal arch, aortic arch, and male genitalia

 MATERNAL FINDINGS
 Cervix:  3.0 cm Transabdominally
IMPRESSION: Single living intrauterine fetus in cephalic presentation with subjectively and quantitatively normal amniotic fluid volume.
 Mean gestational age today is 20 weeks 1 day consistent with appropriate interval growth since the last ultrasound.  
 Visualized fetal anatomy is unremarkable.

## 2004-03-13 ENCOUNTER — Encounter: Admission: RE | Admit: 2004-03-13 | Discharge: 2004-03-13 | Payer: Self-pay | Admitting: Family Medicine

## 2004-04-07 ENCOUNTER — Encounter: Admission: RE | Admit: 2004-04-07 | Discharge: 2004-04-07 | Payer: Self-pay | Admitting: Sports Medicine

## 2004-05-23 ENCOUNTER — Ambulatory Visit: Payer: Self-pay | Admitting: Family Medicine

## 2004-05-30 ENCOUNTER — Ambulatory Visit: Payer: Self-pay

## 2004-06-10 ENCOUNTER — Ambulatory Visit: Payer: Self-pay | Admitting: Family Medicine

## 2004-06-18 ENCOUNTER — Ambulatory Visit: Payer: Self-pay | Admitting: Sports Medicine

## 2004-06-26 ENCOUNTER — Ambulatory Visit: Payer: Self-pay | Admitting: *Deleted

## 2004-06-26 ENCOUNTER — Inpatient Hospital Stay (HOSPITAL_COMMUNITY): Admission: AD | Admit: 2004-06-26 | Discharge: 2004-06-29 | Payer: Self-pay | Admitting: *Deleted

## 2004-06-26 ENCOUNTER — Ambulatory Visit: Payer: Self-pay | Admitting: Family Medicine

## 2004-06-26 IMAGING — US US OB UMBILICAL ART DOPPLER
1 series · 13 of 28 positions shown · non-contrast
Comparison: none

CLINICAL DATA: Maternal glomerulonephritis with pregnancy induced hypertension.  Assess fetal well being.

[Series 1: us ob umbilical art doppler · 0.35mm/px · 13 of 29 slices shown]
[im 2/29]
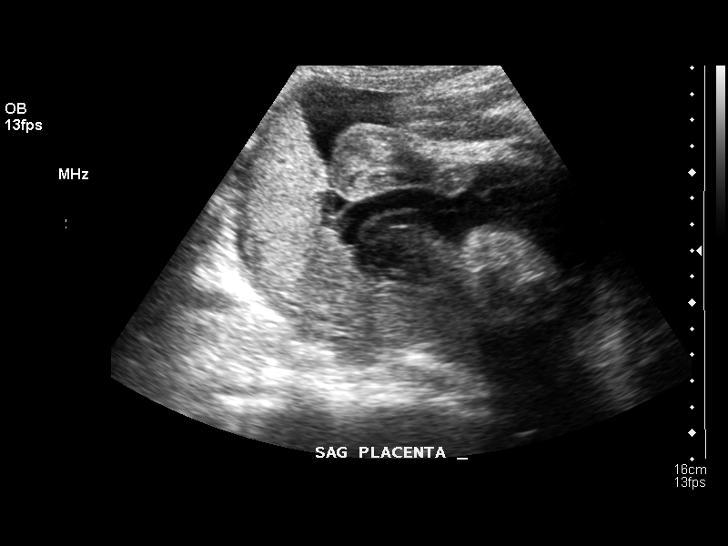
[im 4/29]
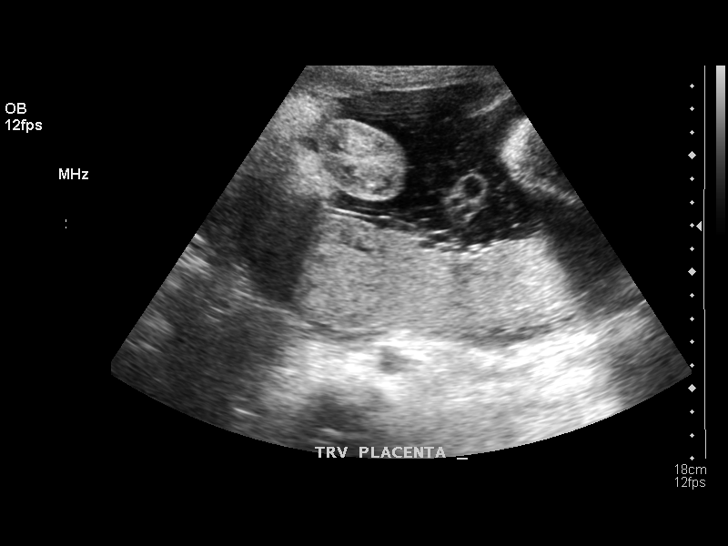
[im 6/29]
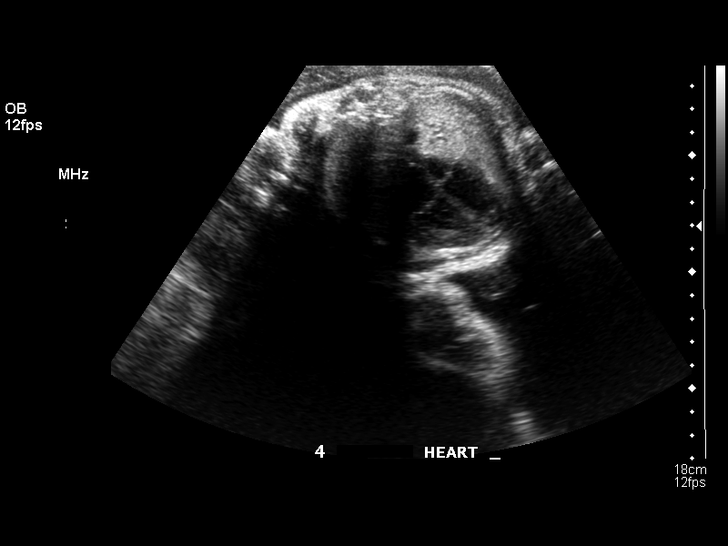
[im 8/29]
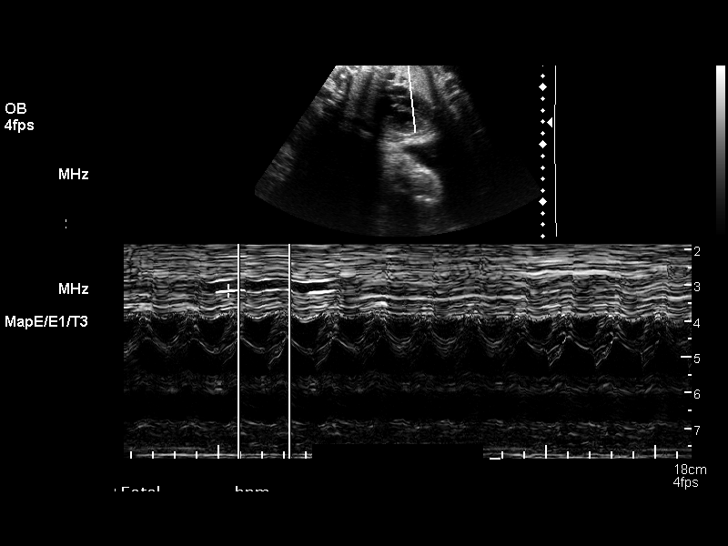
[im 10/29]
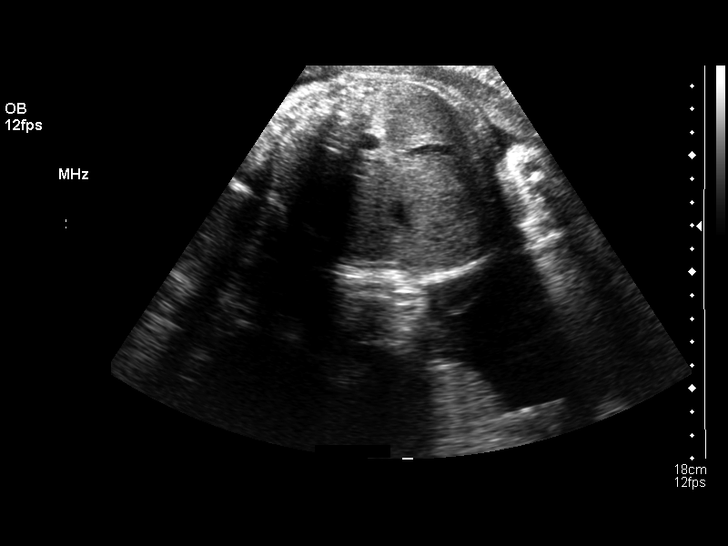
[im 12/29]
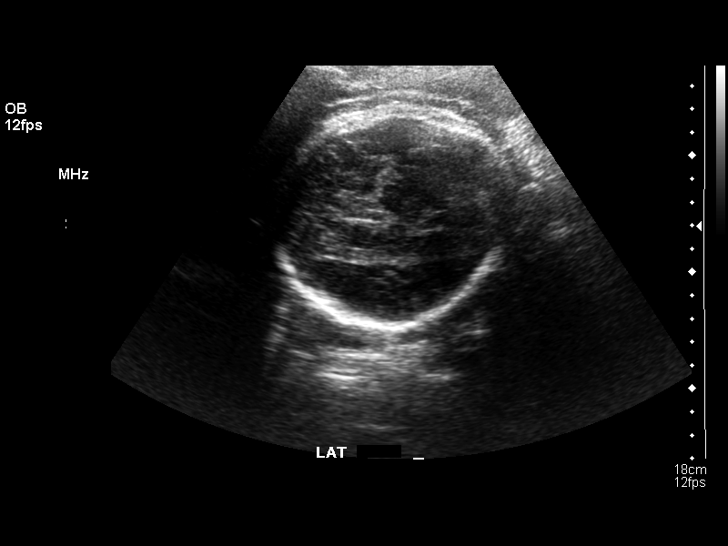
[im 15/29]
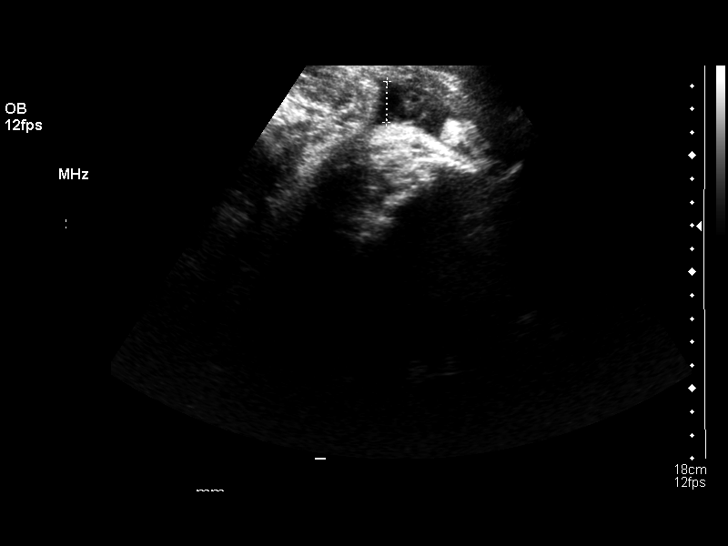
[im 17/29]
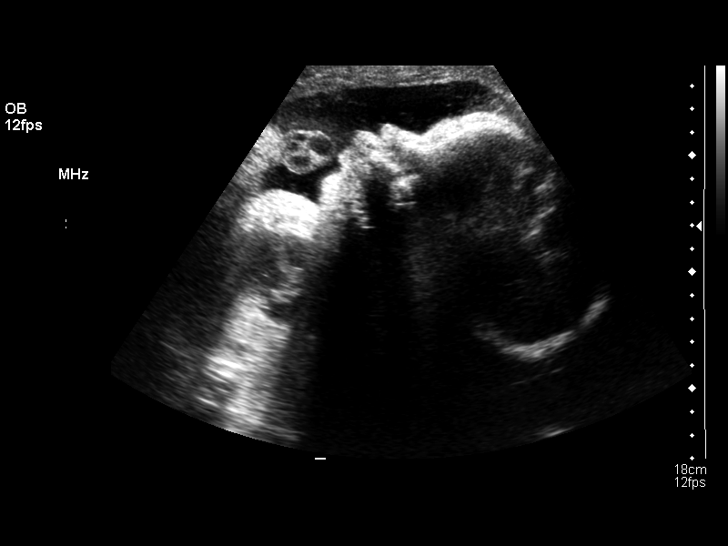
[im 19/29]
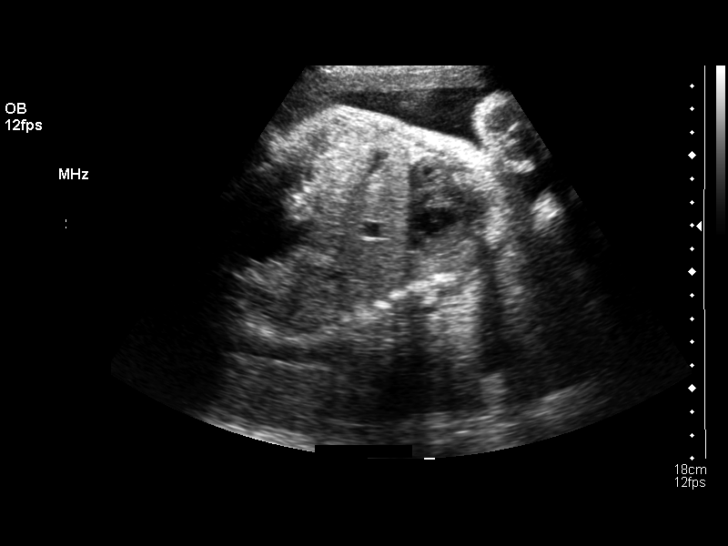
[im 21/29]
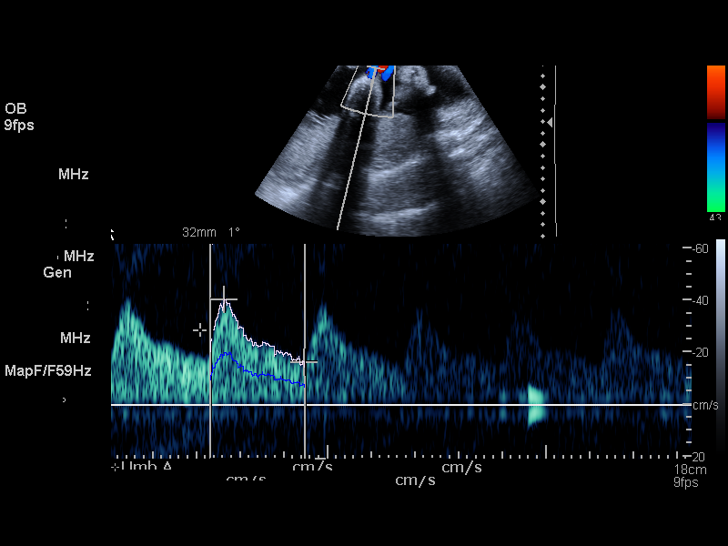
[im 23/29]
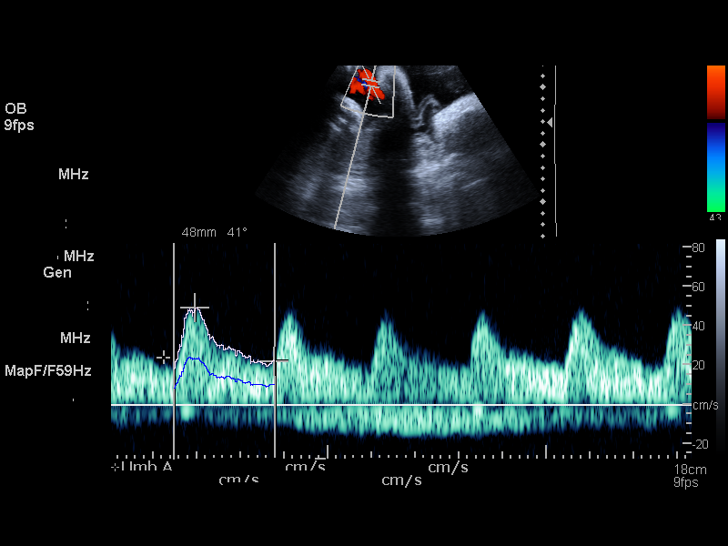
[im 25/29]
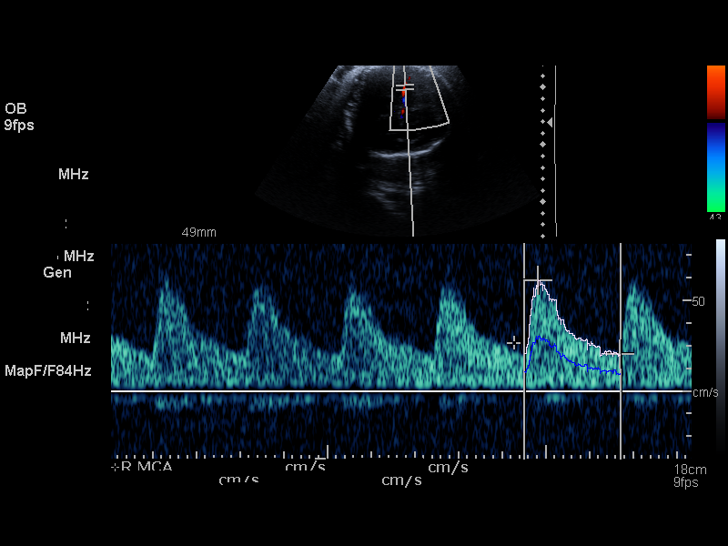
[im 27/29]
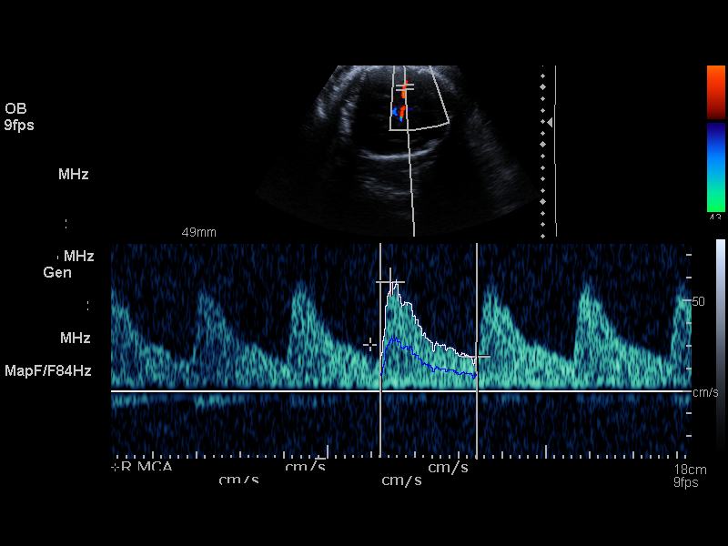

[13 of 28 positions shown; findings below may reference images not displayed]

BIOPHYSICAL PROFILE WITH DOPPLER ASSESSMENT, [DATE]:

Number of Fetuses:  1
Heart rate:  128 bpm
Movement:  yes
Breathing:  yes
Presentation:  vertex
Placental Location:  posterior
Grade: 1
Previa: no
Amniotic Fluid (Subjective): normal
Amniotic Fluid (Objective):  17.9 cm AFI ([OJ] %ile = 7.2 to 22.6 cm) for 39 weeks

Fetal measurements and complete anatomic evaluation were not requested.  The following fetal anatomy was visualized on this exam:  lateral ventricles, four chamber heart, stomach, three-vessel cord, kidneys, bladder, and diaphragm.

BPP SCORING
Movements:   2    Time:  25 minutes
Breathing:  2
Tone:  2
Amniotic Fluid:  2
Total Score:  8

DOPPLER ULTRASOUND OF FETUS:

Umbilical Artery S/D Ratio:  2.34 (NL< 2.94)

Middle Cerebral Artery PI:  1.18 (NL> .98)
MATERNAL FINDINGS:
Cervix: not evaluated; > 37 weeks
IMPRESSION: 1.  Biophysical profile score [DATE].
2.  Subjectively and quantitatively normal amniotic fluid volume.
3.  Normal umbilical artery and middle cerebral artery Doppler assessment. No absence or reversal of end diastolic flow was identified on any of the obtained strokes.

## 2004-08-21 ENCOUNTER — Ambulatory Visit: Payer: Self-pay | Admitting: Family Medicine

## 2004-09-02 ENCOUNTER — Ambulatory Visit: Payer: Self-pay | Admitting: Family Medicine

## 2005-05-05 ENCOUNTER — Ambulatory Visit: Payer: Self-pay | Admitting: Family Medicine

## 2005-07-29 ENCOUNTER — Encounter (INDEPENDENT_AMBULATORY_CARE_PROVIDER_SITE_OTHER): Payer: Self-pay | Admitting: *Deleted

## 2005-07-29 LAB — CONVERTED CEMR LAB

## 2005-08-05 ENCOUNTER — Ambulatory Visit: Payer: Self-pay | Admitting: Family Medicine

## 2005-08-05 ENCOUNTER — Other Ambulatory Visit: Admission: RE | Admit: 2005-08-05 | Discharge: 2005-08-05 | Payer: Self-pay | Admitting: Family Medicine

## 2005-10-13 ENCOUNTER — Ambulatory Visit: Payer: Self-pay | Admitting: Family Medicine

## 2006-01-01 ENCOUNTER — Ambulatory Visit: Payer: Self-pay | Admitting: Family Medicine

## 2006-08-10 ENCOUNTER — Ambulatory Visit: Payer: Self-pay | Admitting: Family Medicine

## 2006-10-21 DIAGNOSIS — Z992 Dependence on renal dialysis: Secondary | ICD-10-CM | POA: Insufficient documentation

## 2006-10-21 DIAGNOSIS — N039 Chronic nephritic syndrome with unspecified morphologic changes: Secondary | ICD-10-CM | POA: Insufficient documentation

## 2006-10-21 DIAGNOSIS — N186 End stage renal disease: Secondary | ICD-10-CM | POA: Insufficient documentation

## 2006-10-22 ENCOUNTER — Encounter (INDEPENDENT_AMBULATORY_CARE_PROVIDER_SITE_OTHER): Payer: Self-pay | Admitting: *Deleted

## 2006-11-25 ENCOUNTER — Emergency Department (HOSPITAL_COMMUNITY): Admission: EM | Admit: 2006-11-25 | Discharge: 2006-11-25 | Payer: Self-pay | Admitting: Emergency Medicine

## 2007-01-11 ENCOUNTER — Encounter (INDEPENDENT_AMBULATORY_CARE_PROVIDER_SITE_OTHER): Payer: Self-pay | Admitting: Family Medicine

## 2007-04-28 ENCOUNTER — Encounter (INDEPENDENT_AMBULATORY_CARE_PROVIDER_SITE_OTHER): Payer: Self-pay | Admitting: *Deleted

## 2007-06-23 ENCOUNTER — Inpatient Hospital Stay (HOSPITAL_COMMUNITY): Admission: AD | Admit: 2007-06-23 | Discharge: 2007-06-23 | Payer: Self-pay | Admitting: Obstetrics & Gynecology

## 2007-06-23 IMAGING — US US OB TRANSVAGINAL MODIFY
1 series · 14 of 28 positions shown · non-contrast
Comparison: None

CLINICAL DATA: Back pain, positive pregnancy test.

TRANSABDOMINAL AND TRANSVAGINAL PELVIC ULTRASOUND
TECHNIQUE: Both transabdominal and transvaginal ultrasound examinations of the
pelvis were performed including evaluation of the uterus, ovaries, adnexal
regions, and pelvic cul-de-sac.

[Series 1: us ob comp less 14 wks · 14 of 55 slices shown]
[im 3/55]
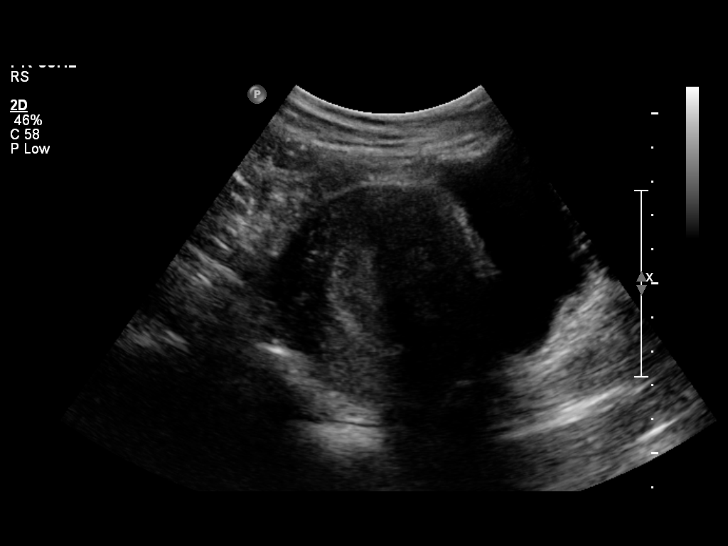
[im 7/55]
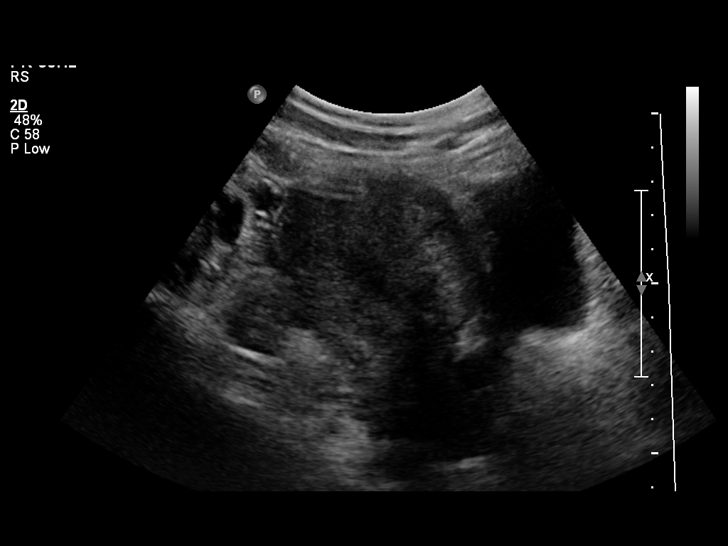
[im 11/55]
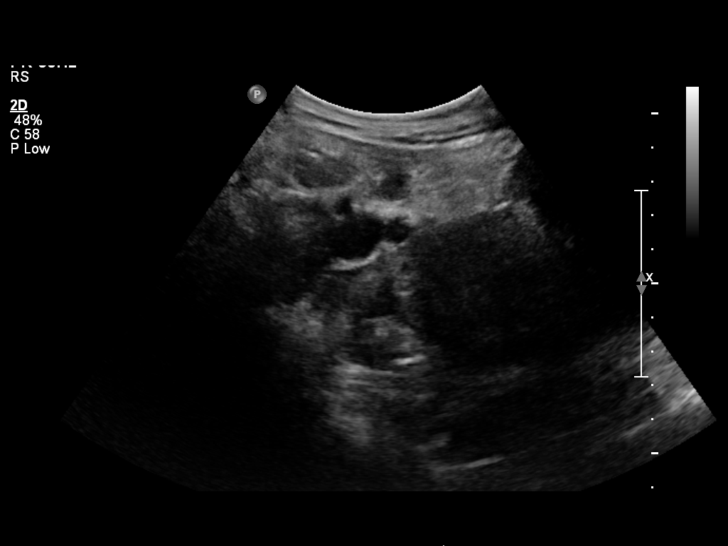
[im 15/55]
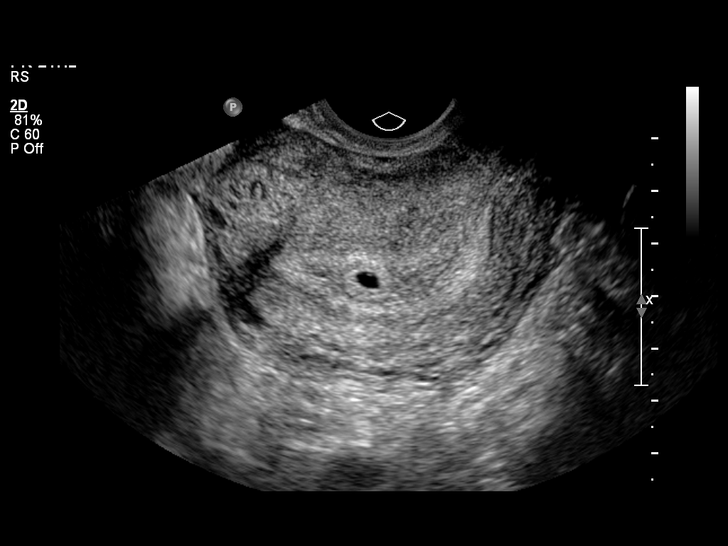
[im 19/55]
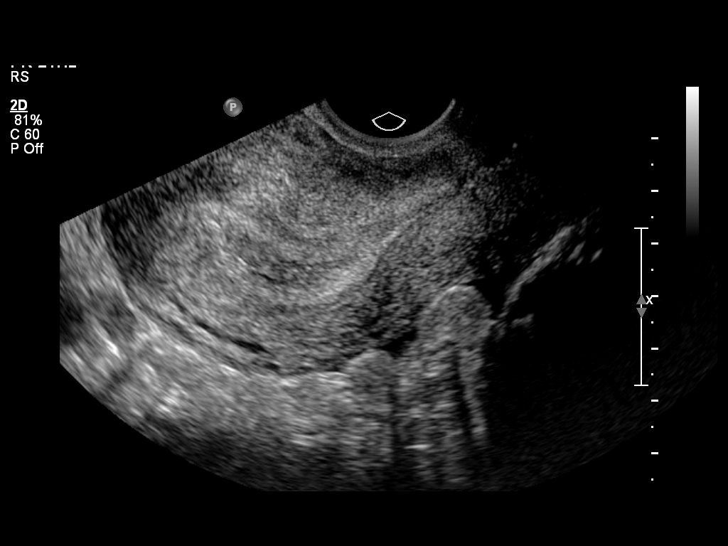
[im 23/55]
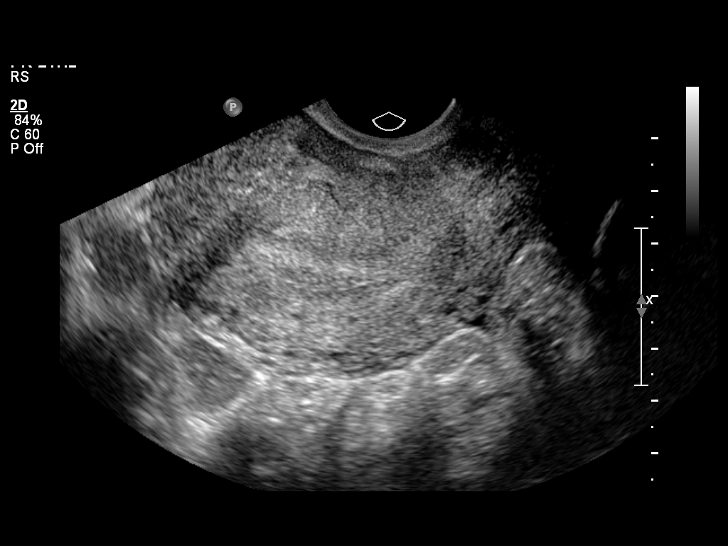
[im 27/55]
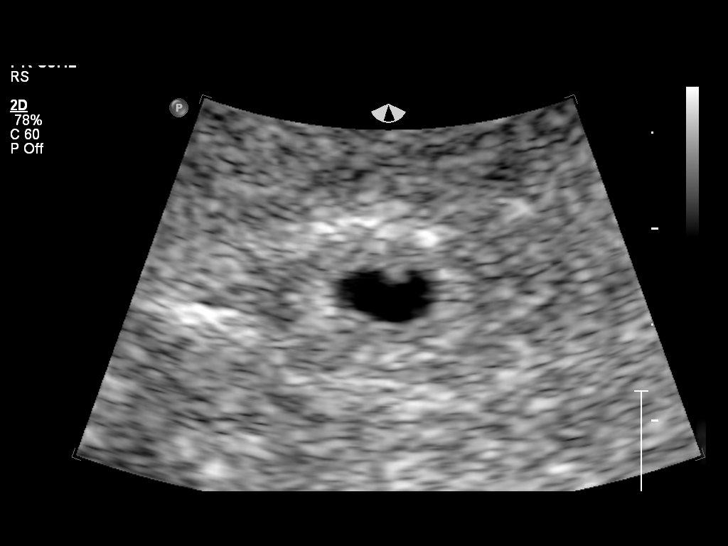
[im 31/55]
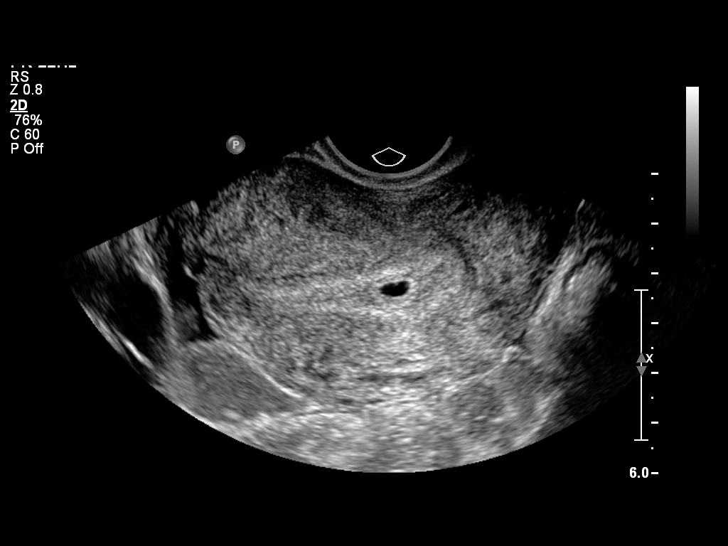
[im 35/55]
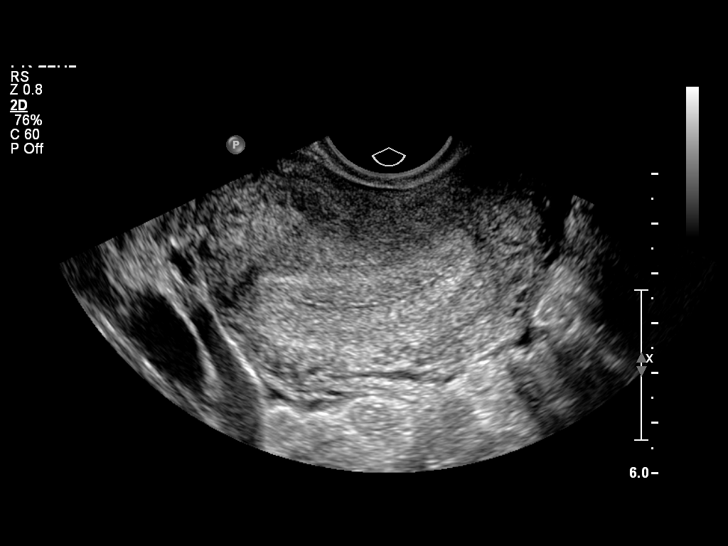
[im 39/55]
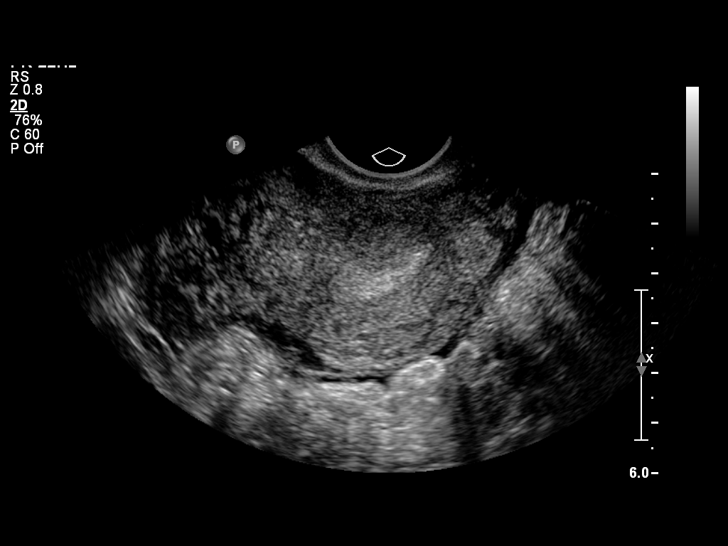
[im 43/55]
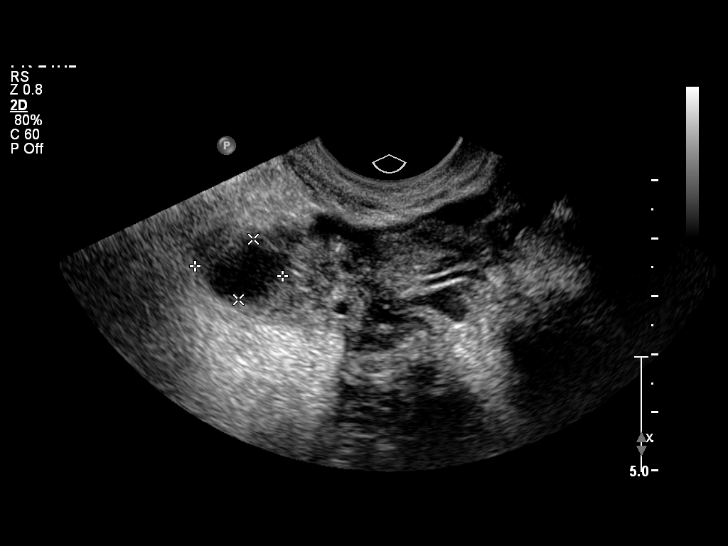
[im 47/55]
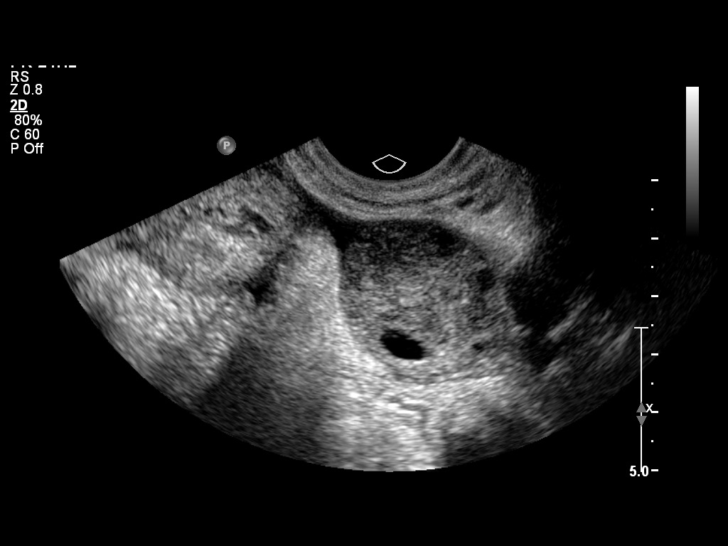
[im 51/55]
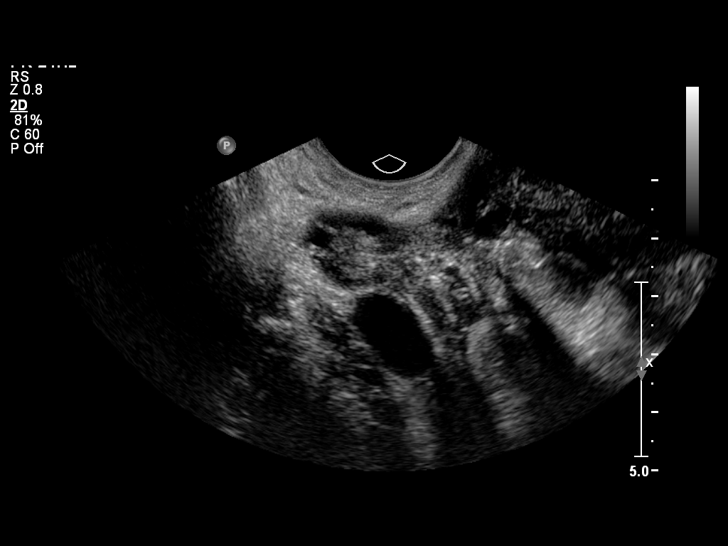
[im 55/55]
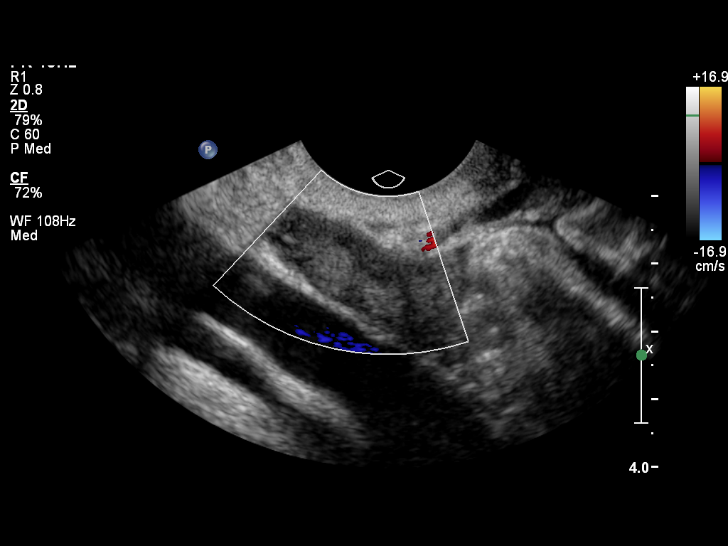

[14 of 28 positions shown; findings below may reference images not displayed]

FINDINGS: Right ovary measures 2.4 x 1.3 cm. Left ovary measures 3.3 x 2.3 cm,
with a dominant cyst.
Small amount of free fluid present in pouch of SADIYO.
Central cystic lesion is present within the endometrial canal, consistent with
an intrauterine gestational sac. No fetal pole or yolk sac is identified, likely
due to early dates. Mean sac diameter is 4.4 mm, correlating with 5 weeks, 0
days.
Mildly thickened endometrium, with appearance suggesting double decidual sac
sign.

IMPRESSION
1. Small cystic lesion in endometrial canal. This likely represents early
intrauterine pregnancy, with differential being normal or abnormal intrauterine
pregnancy, spontaneous abortion, or less likely ectopic pregnancy. Recommend
followup ultrasound and quantitative serial beta HCGs.

## 2007-07-23 ENCOUNTER — Inpatient Hospital Stay (HOSPITAL_COMMUNITY): Admission: AD | Admit: 2007-07-23 | Discharge: 2007-07-23 | Payer: Self-pay | Admitting: Obstetrics & Gynecology

## 2007-07-26 ENCOUNTER — Telehealth: Payer: Self-pay | Admitting: *Deleted

## 2007-08-01 ENCOUNTER — Telehealth (INDEPENDENT_AMBULATORY_CARE_PROVIDER_SITE_OTHER): Payer: Self-pay | Admitting: Family Medicine

## 2007-08-04 ENCOUNTER — Encounter (INDEPENDENT_AMBULATORY_CARE_PROVIDER_SITE_OTHER): Payer: Self-pay | Admitting: *Deleted

## 2007-08-08 ENCOUNTER — Encounter (INDEPENDENT_AMBULATORY_CARE_PROVIDER_SITE_OTHER): Payer: Self-pay | Admitting: Family Medicine

## 2007-08-08 ENCOUNTER — Ambulatory Visit: Payer: Self-pay | Admitting: Family Medicine

## 2007-08-08 LAB — CONVERTED CEMR LAB
Antibody Screen: NEGATIVE
Antibody Screen: NEGATIVE
Basophils Absolute: 0 10*3/uL (ref 0.0–0.1)
Basophils Relative: 0 % (ref 0–1)
Eosinophils Absolute: 0.1 10*3/uL — ABNORMAL LOW (ref 0.2–0.7)
Eosinophils Relative: 1 % (ref 0–5)
HCT: 32.8 %
HCT: 32.8 % — ABNORMAL LOW (ref 36.0–46.0)
Hemoglobin: 10.8 g/dL
Hemoglobin: 10.8 g/dL — ABNORMAL LOW (ref 12.0–15.0)
Hepatitis B Surface Ag: NEGATIVE
Hepatitis B Surface Ag: NEGATIVE
Lymphocytes Relative: 14 % (ref 12–46)
Lymphs Abs: 1.1 10*3/uL (ref 0.7–4.0)
MCHC: 32.9 g/dL (ref 30.0–36.0)
MCV: 89.6 fL (ref 78.0–100.0)
Monocytes Absolute: 0.8 10*3/uL (ref 0.1–1.0)
Monocytes Relative: 10 % (ref 3–12)
Neutro Abs: 5.8 10*3/uL (ref 1.7–7.7)
Neutrophils Relative %: 75 % (ref 43–77)
Platelets: 246 10*3/uL (ref 150–400)
RBC: 3.66 M/uL — ABNORMAL LOW (ref 3.87–5.11)
RDW: 13 % (ref 11.5–15.5)
Rh Type: POSITIVE
Rubella antibody, serum, titer: IMMUNE
Rubella: 58.2 intl units/mL — ABNORMAL HIGH
VDRL, Serum: NEGATIVE
WBC: 7.8 10*3/uL (ref 4.0–10.5)

## 2007-08-11 ENCOUNTER — Other Ambulatory Visit: Admission: RE | Admit: 2007-08-11 | Discharge: 2007-08-11 | Payer: Self-pay | Admitting: Family Medicine

## 2007-08-11 ENCOUNTER — Encounter: Payer: Self-pay | Admitting: *Deleted

## 2007-08-11 ENCOUNTER — Encounter (INDEPENDENT_AMBULATORY_CARE_PROVIDER_SITE_OTHER): Payer: Self-pay | Admitting: Family Medicine

## 2007-08-11 ENCOUNTER — Encounter: Payer: Self-pay | Admitting: Family Medicine

## 2007-08-11 ENCOUNTER — Ambulatory Visit: Payer: Self-pay | Admitting: Family Medicine

## 2007-08-11 DIAGNOSIS — Z87898 Personal history of other specified conditions: Secondary | ICD-10-CM | POA: Insufficient documentation

## 2007-08-11 DIAGNOSIS — O10019 Pre-existing essential hypertension complicating pregnancy, unspecified trimester: Secondary | ICD-10-CM | POA: Insufficient documentation

## 2007-08-11 DIAGNOSIS — D649 Anemia, unspecified: Secondary | ICD-10-CM | POA: Insufficient documentation

## 2007-08-11 LAB — CONVERTED CEMR LAB: Whiff Test: POSITIVE

## 2007-08-12 ENCOUNTER — Encounter (INDEPENDENT_AMBULATORY_CARE_PROVIDER_SITE_OTHER): Payer: Self-pay | Admitting: Family Medicine

## 2007-08-12 ENCOUNTER — Encounter: Payer: Self-pay | Admitting: *Deleted

## 2007-08-12 LAB — CONVERTED CEMR LAB
Free T4: 1.44 ng/dL (ref 0.89–1.80)
T3, Free: 3.2 pg/mL (ref 2.3–4.2)
TSH: 0.037 microintl units/mL — ABNORMAL LOW (ref 0.350–5.50)

## 2007-08-19 ENCOUNTER — Encounter (INDEPENDENT_AMBULATORY_CARE_PROVIDER_SITE_OTHER): Payer: Self-pay | Admitting: Family Medicine

## 2007-09-02 ENCOUNTER — Telehealth: Payer: Self-pay | Admitting: *Deleted

## 2007-09-15 ENCOUNTER — Ambulatory Visit: Payer: Self-pay | Admitting: Family Medicine

## 2007-09-16 ENCOUNTER — Ambulatory Visit: Payer: Self-pay | Admitting: Obstetrics & Gynecology

## 2007-09-21 ENCOUNTER — Ambulatory Visit: Payer: Self-pay | Admitting: Obstetrics & Gynecology

## 2007-09-21 ENCOUNTER — Ambulatory Visit (HOSPITAL_COMMUNITY): Admission: RE | Admit: 2007-09-21 | Discharge: 2007-09-21 | Payer: Self-pay | Admitting: Family Medicine

## 2007-09-21 IMAGING — US US OB DETAIL+14 WK
1 series · 14 of 28 positions shown · non-contrast
Comparison: none

OBSTETRICAL ULTRASOUND:
 This ultrasound was performed in The [HOSPITAL], and the [facility] report will be stored to [REDACTED] PACS.

[Series 1: us ob detail+14 wk · 14 of 101 slices shown]
[im 4/101]
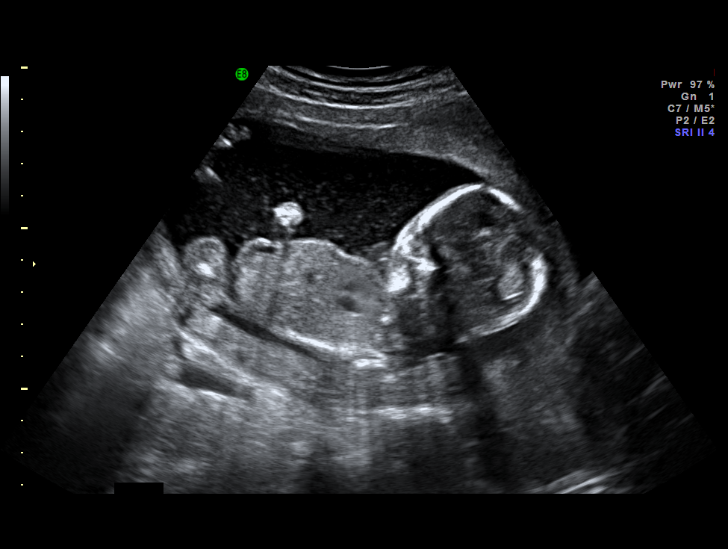
[im 12/101]
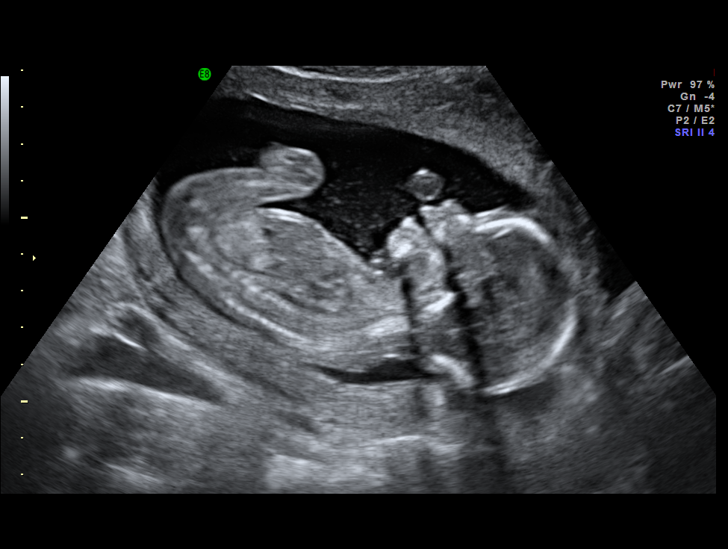
[im 19/101]
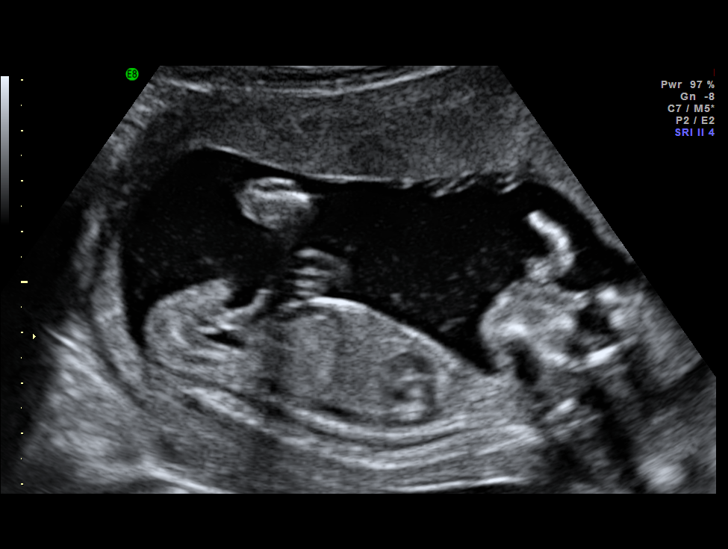
[im 26/101]
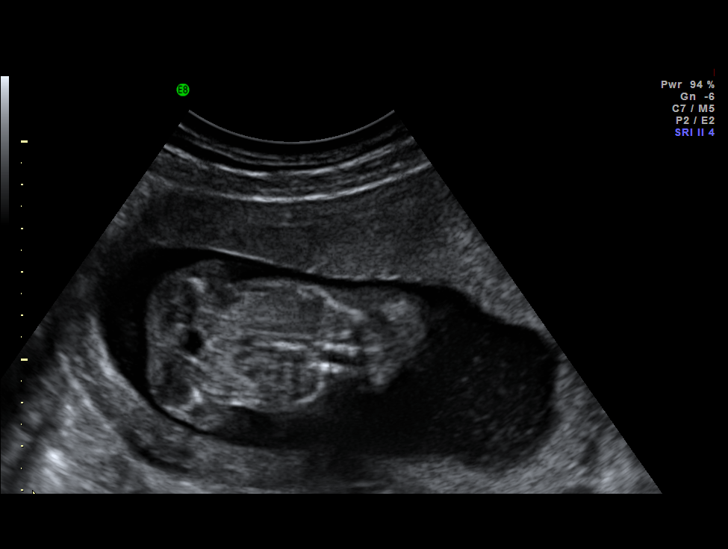
[im 34/101]
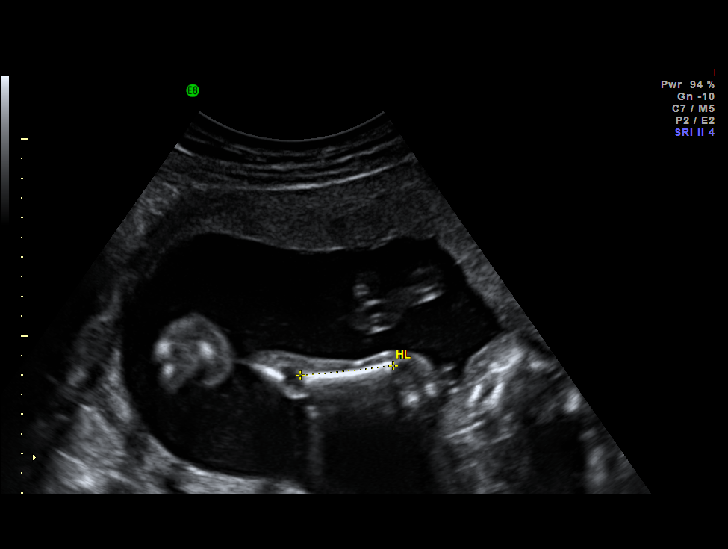
[im 41/101]
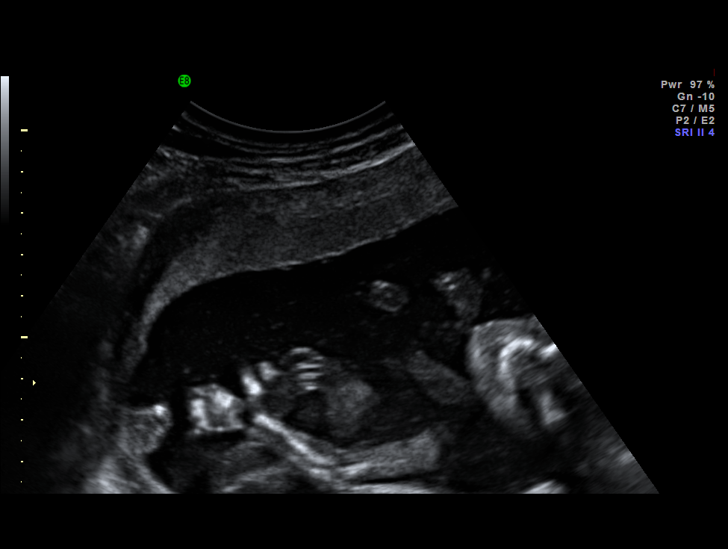
[im 49/101]
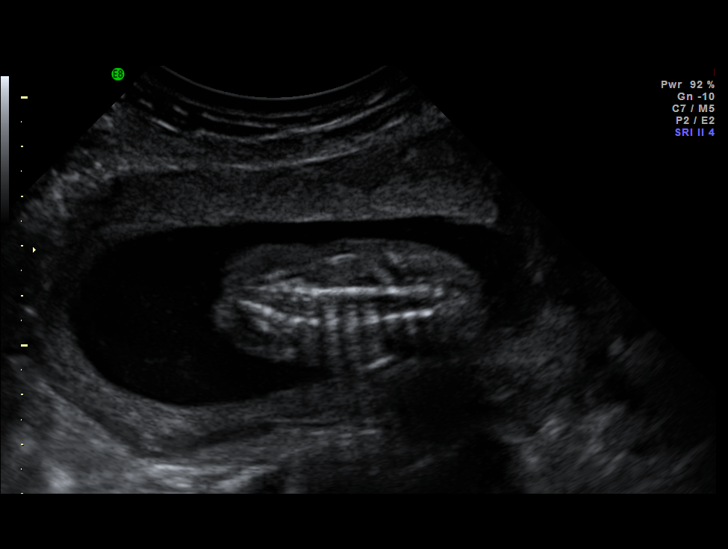
[im 56/101]
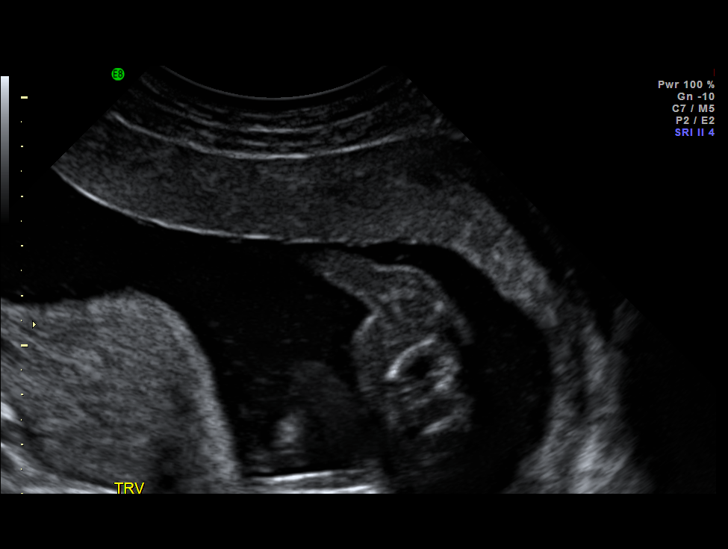
[im 63/101]
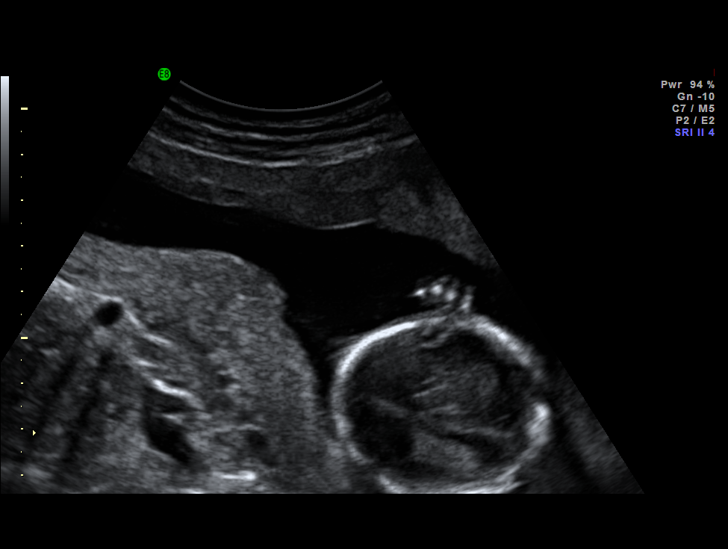
[im 71/101]
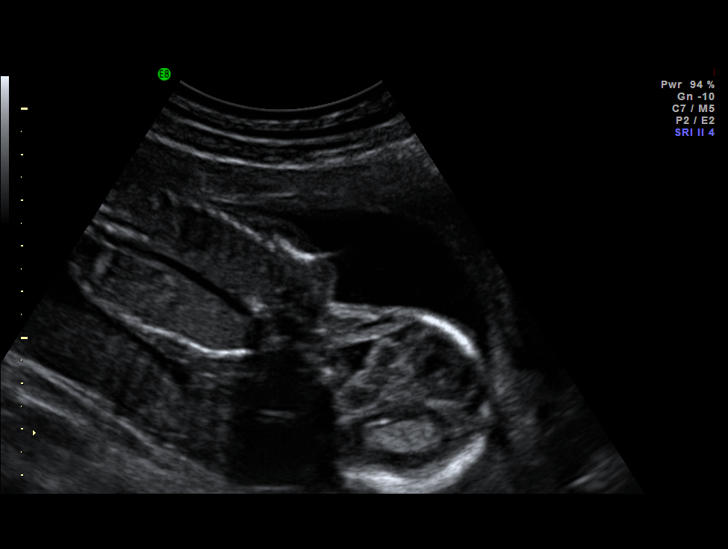
[im 78/101]
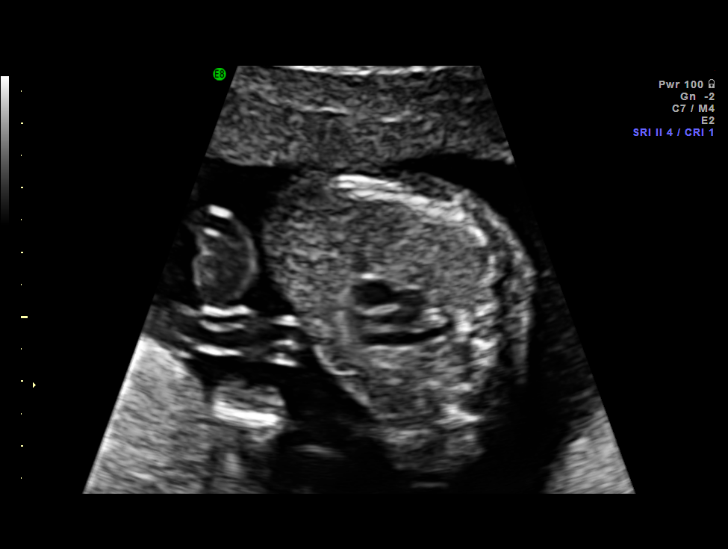
[im 86/101]
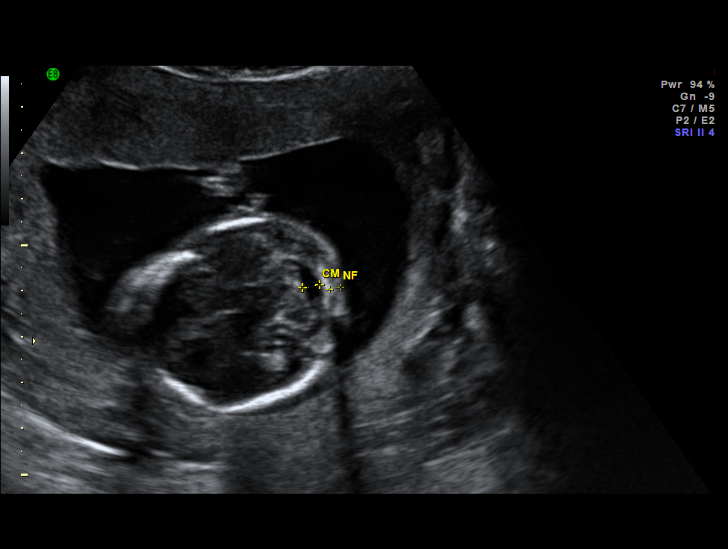
[im 93/101]
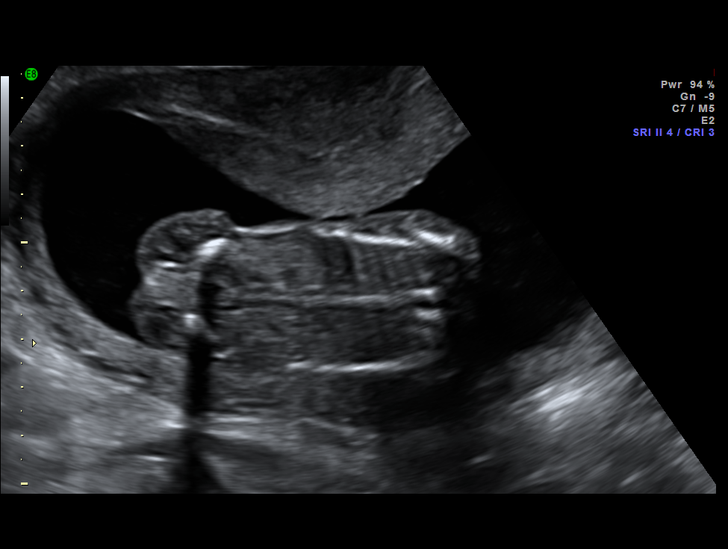
[im 101/101]
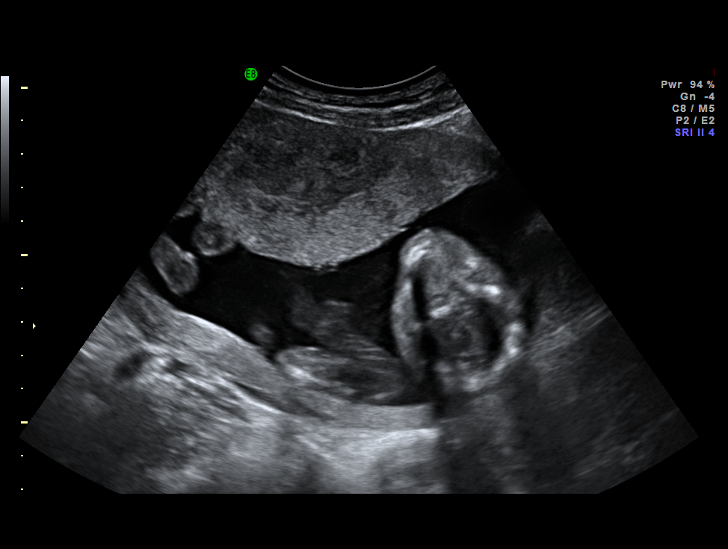

[14 of 28 positions shown; findings below may reference images not displayed]

IMPRESSION: The [facility] report has also been faxed to the ordering physician.

## 2007-09-27 ENCOUNTER — Telehealth (INDEPENDENT_AMBULATORY_CARE_PROVIDER_SITE_OTHER): Payer: Self-pay | Admitting: Family Medicine

## 2007-09-28 ENCOUNTER — Encounter (INDEPENDENT_AMBULATORY_CARE_PROVIDER_SITE_OTHER): Payer: Self-pay | Admitting: Family Medicine

## 2007-09-29 ENCOUNTER — Ambulatory Visit: Payer: Self-pay | Admitting: Family Medicine

## 2007-10-06 ENCOUNTER — Ambulatory Visit: Payer: Self-pay | Admitting: Obstetrics & Gynecology

## 2007-10-07 ENCOUNTER — Ambulatory Visit (HOSPITAL_COMMUNITY): Admission: RE | Admit: 2007-10-07 | Discharge: 2007-10-07 | Payer: Self-pay | Admitting: Family Medicine

## 2007-10-13 ENCOUNTER — Ambulatory Visit: Payer: Self-pay | Admitting: Family Medicine

## 2007-10-19 ENCOUNTER — Ambulatory Visit (HOSPITAL_COMMUNITY): Admission: RE | Admit: 2007-10-19 | Discharge: 2007-10-19 | Payer: Self-pay

## 2007-10-19 IMAGING — US US OB FOLLOW-UP
1 series · 14 of 28 positions shown · non-contrast
Comparison: none

OBSTETRICAL ULTRASOUND:
 This ultrasound was performed in The [HOSPITAL], and the [facility] report will be stored to [REDACTED] PACS.

[Series 1: us ob follow-up · 14 of 56 slices shown]
[im 3/56]
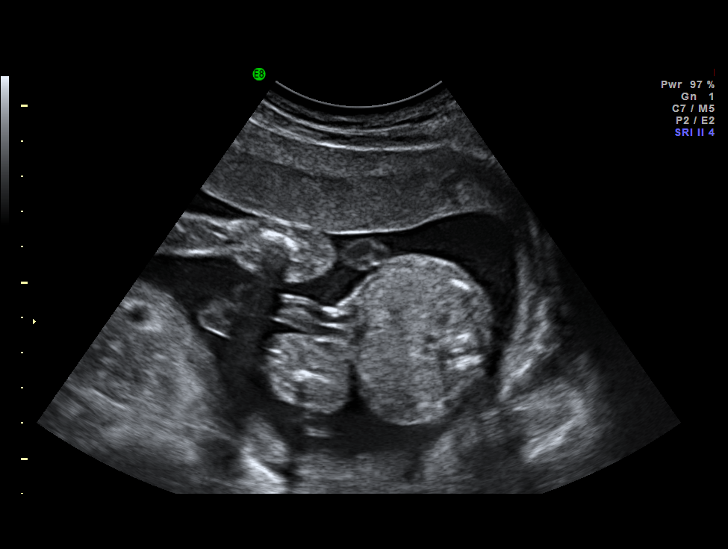
[im 7/56]
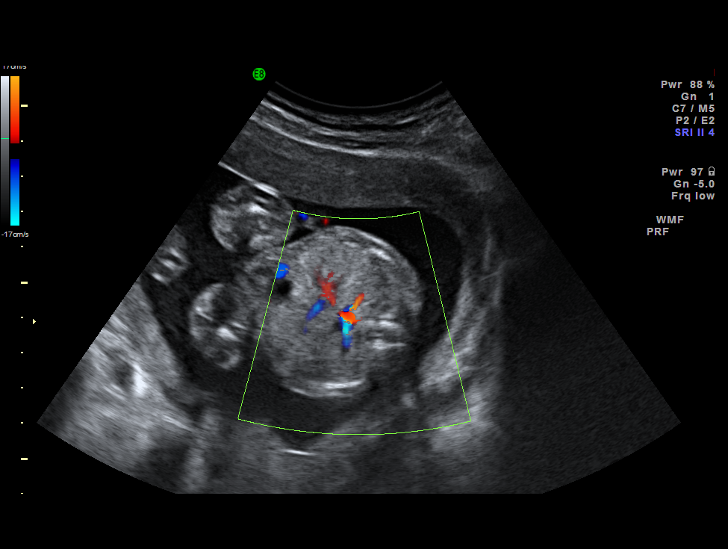
[im 11/56]
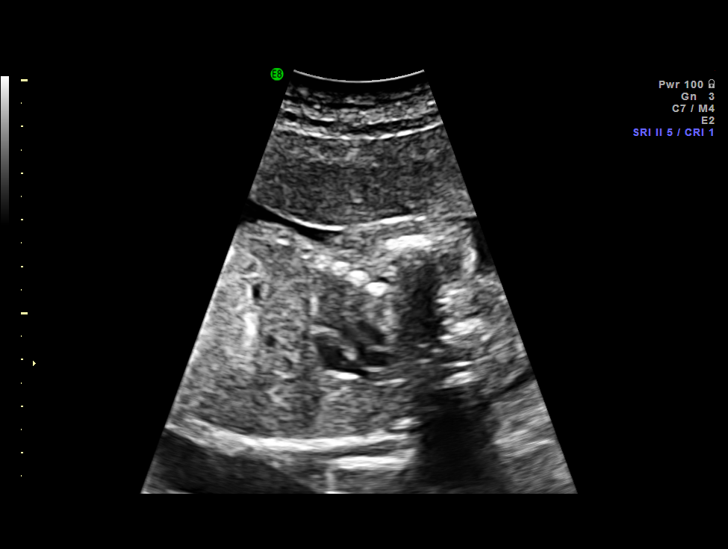
[im 15/56]
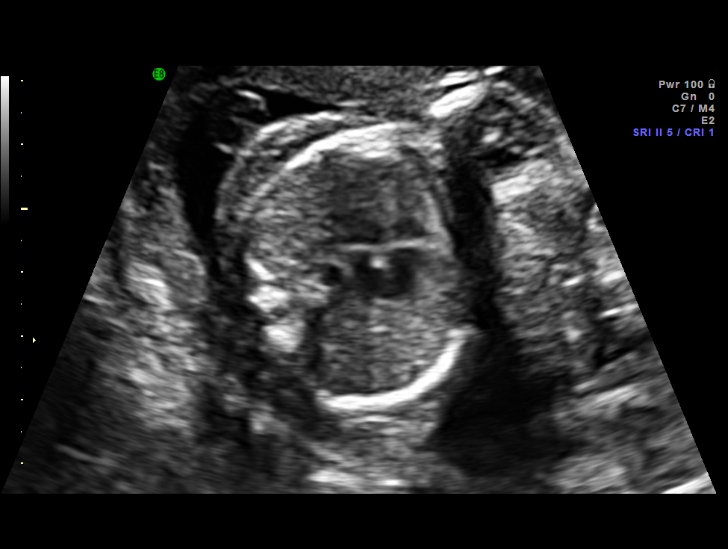
[im 19/56]
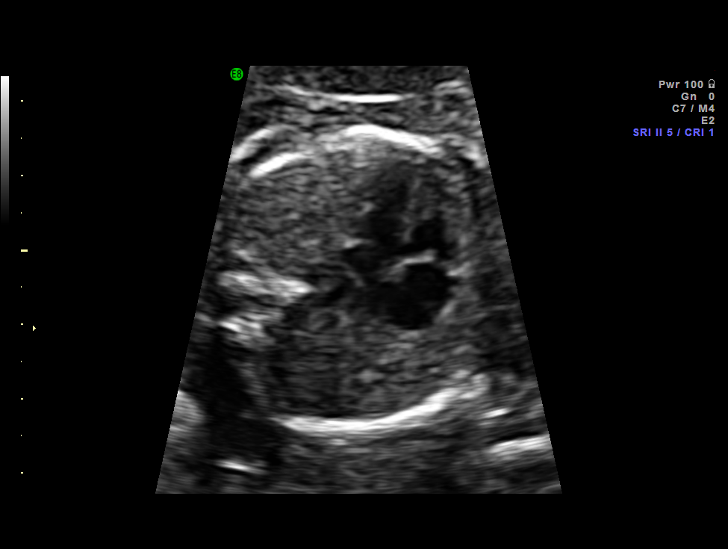
[im 23/56]
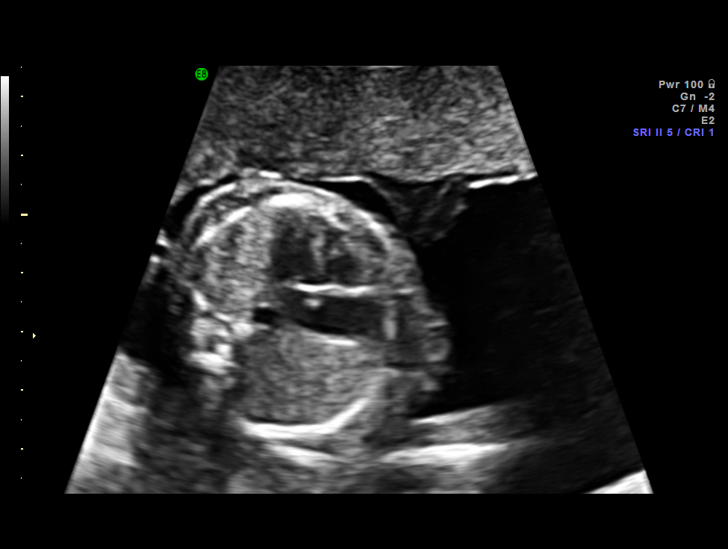
[im 27/56]
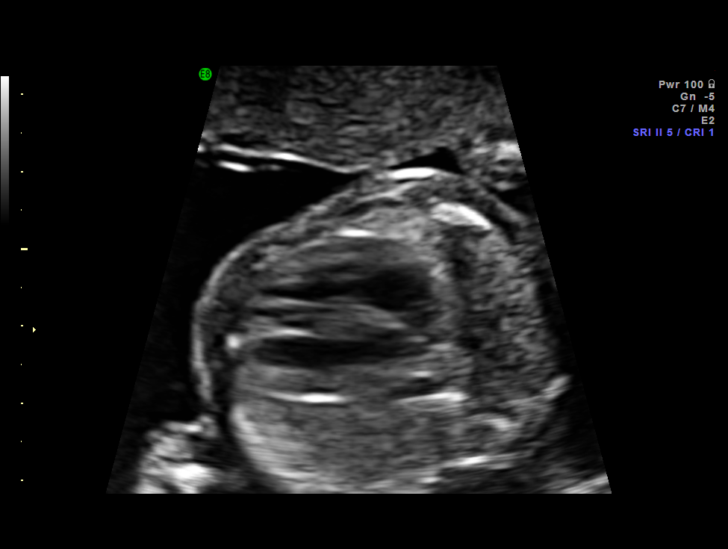
[im 31/56]
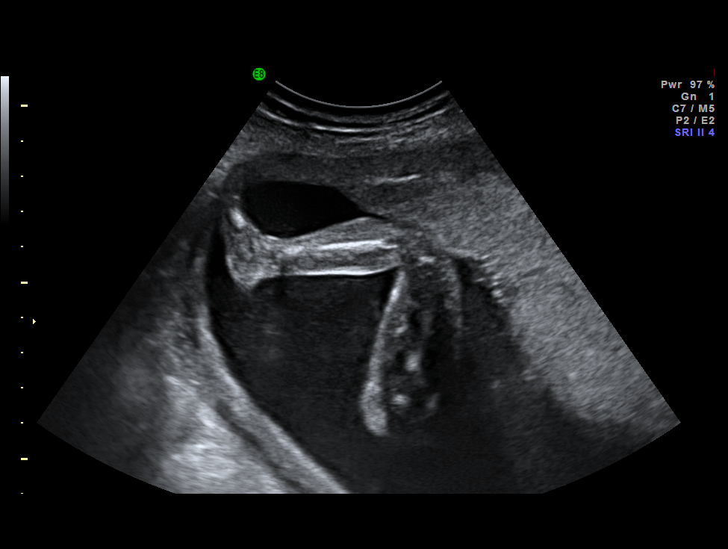
[im 35/56]
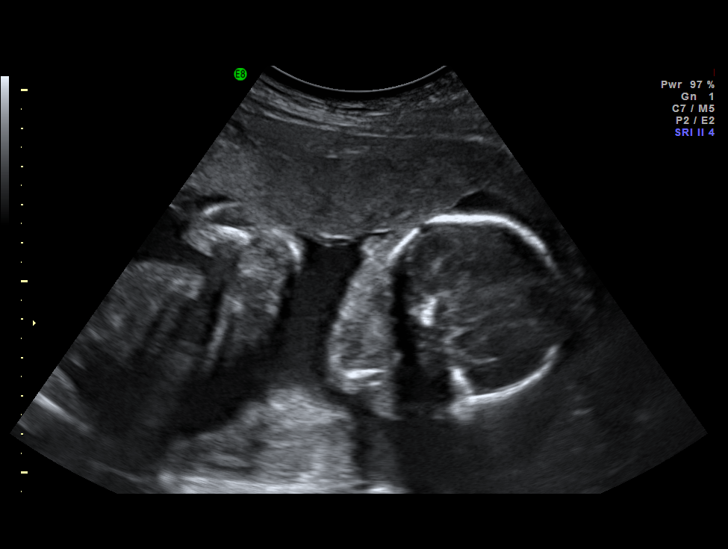
[im 39/56]
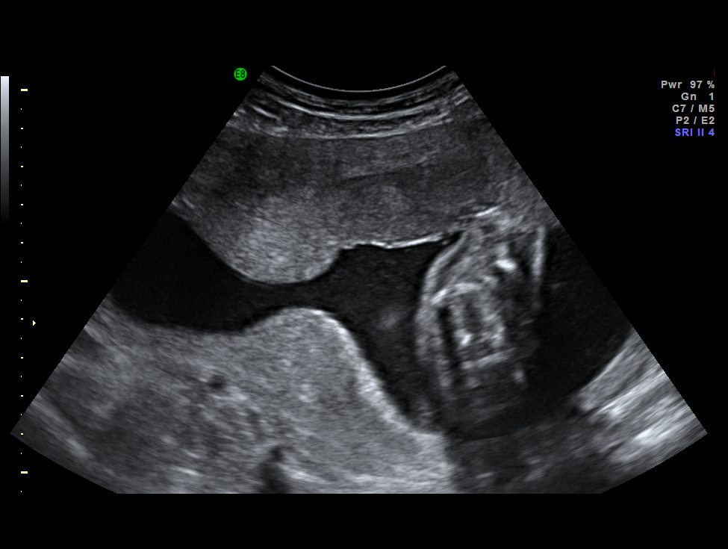
[im 43/56]
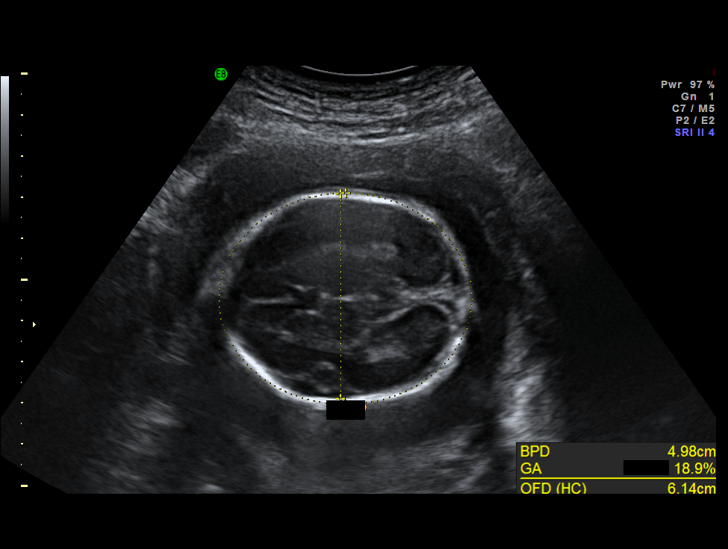
[im 47/56]
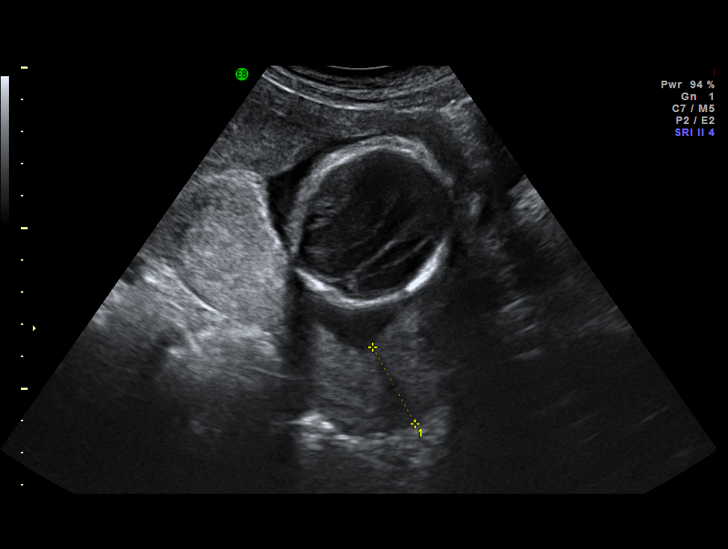
[im 51/56]
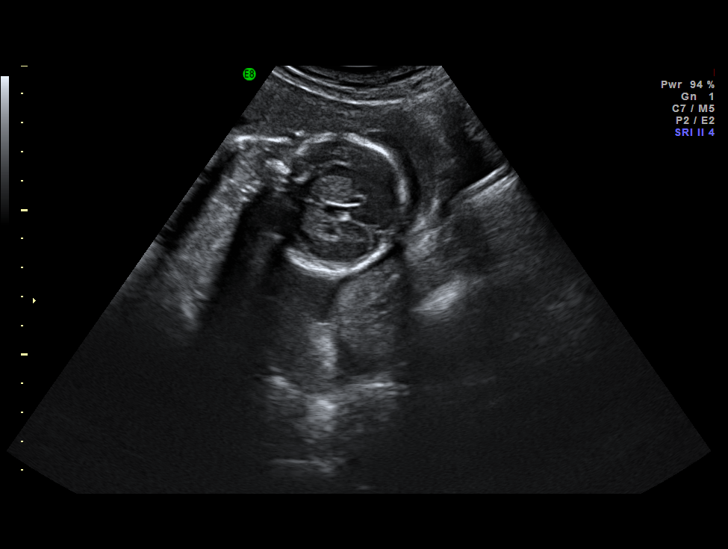
[im 56/56]
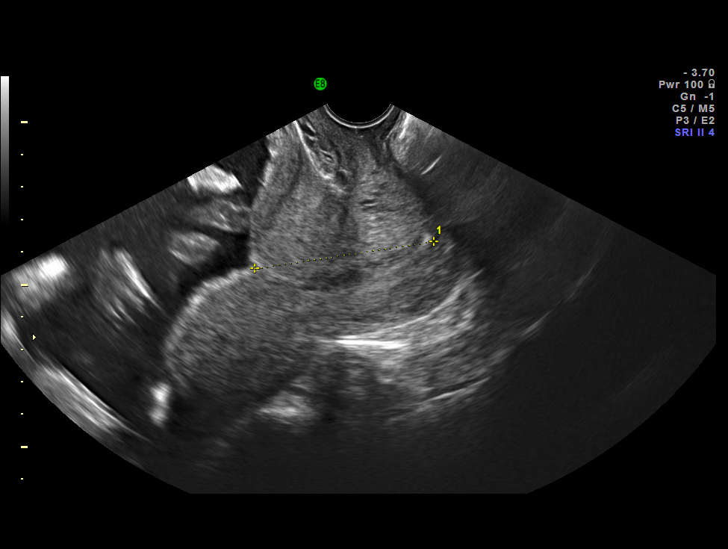

[14 of 28 positions shown; findings below may reference images not displayed]

IMPRESSION: The [facility] report has also been faxed to the ordering physician.

## 2007-10-20 ENCOUNTER — Ambulatory Visit: Payer: Self-pay | Admitting: Obstetrics & Gynecology

## 2007-10-27 ENCOUNTER — Ambulatory Visit: Payer: Self-pay | Admitting: Obstetrics & Gynecology

## 2007-11-09 ENCOUNTER — Ambulatory Visit (HOSPITAL_COMMUNITY): Admission: RE | Admit: 2007-11-09 | Discharge: 2007-11-09 | Payer: Self-pay | Admitting: Family Medicine

## 2007-11-09 IMAGING — US US OB FOLLOW-UP
1 series · 14 of 28 positions shown · non-contrast
Comparison: none

OBSTETRICAL ULTRASOUND:
 This ultrasound was performed in The [[facility]OSP[facility]], and the [facility] report will be stored to [REDACTED] PACS.

[Series 1: us ob follow-up · 14 of 52 slices shown]
[im 2/52]
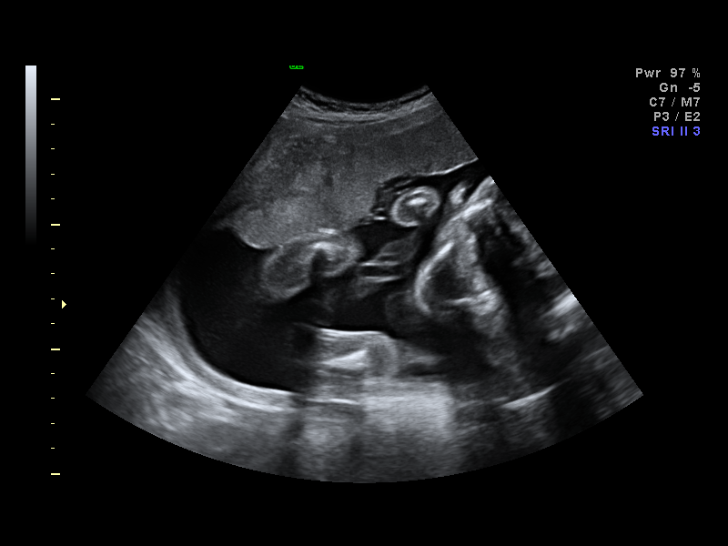
[im 6/52]
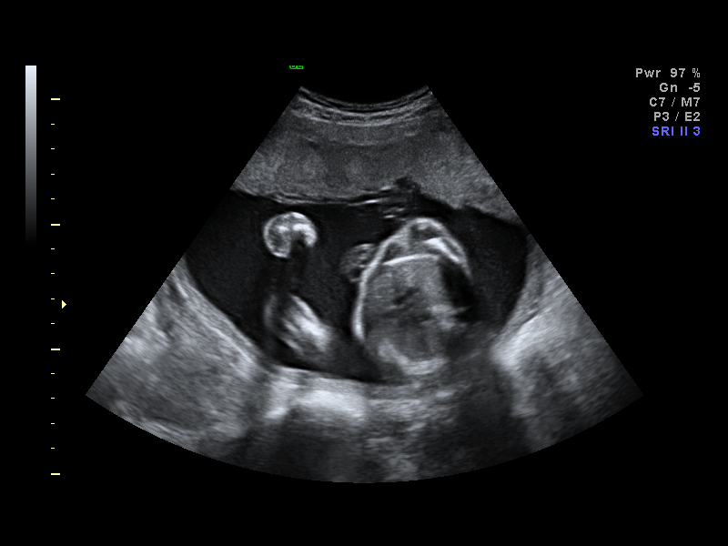
[im 10/52]
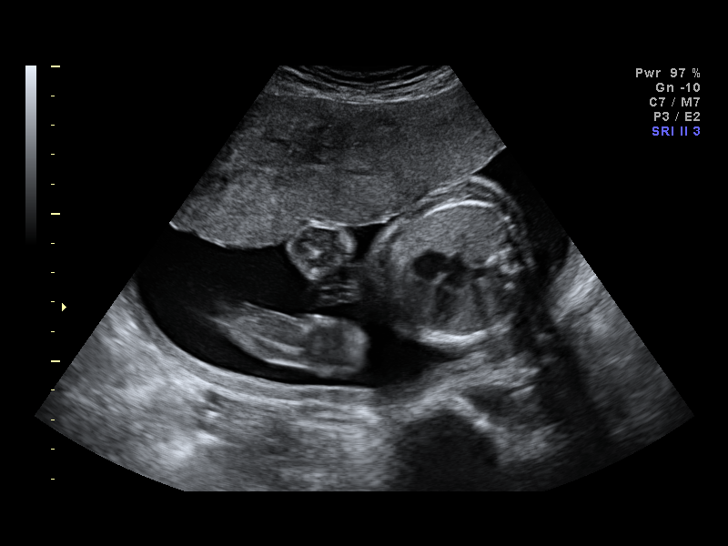
[im 14/52]
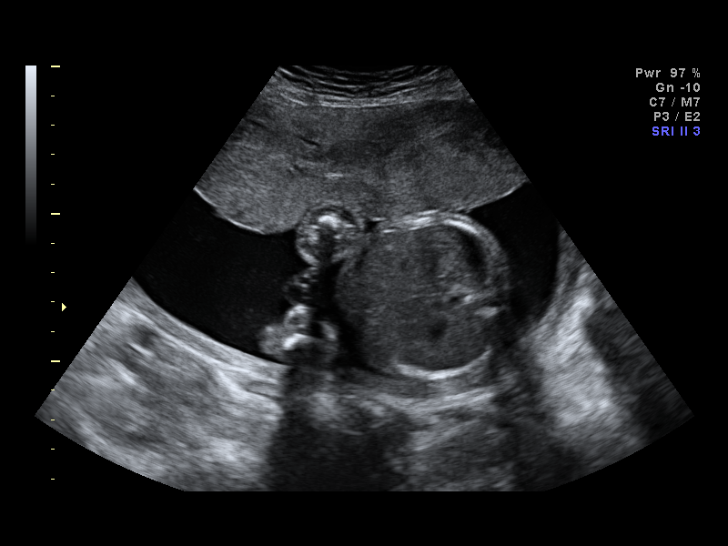
[im 18/52]
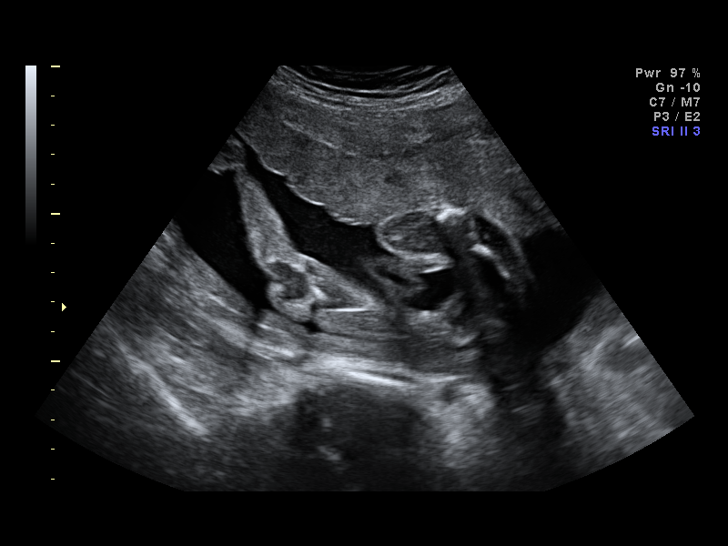
[im 21/52]
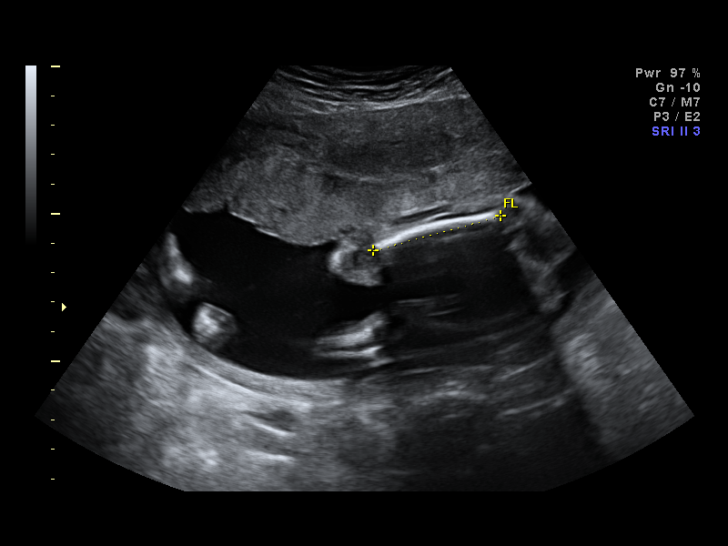
[im 25/52]
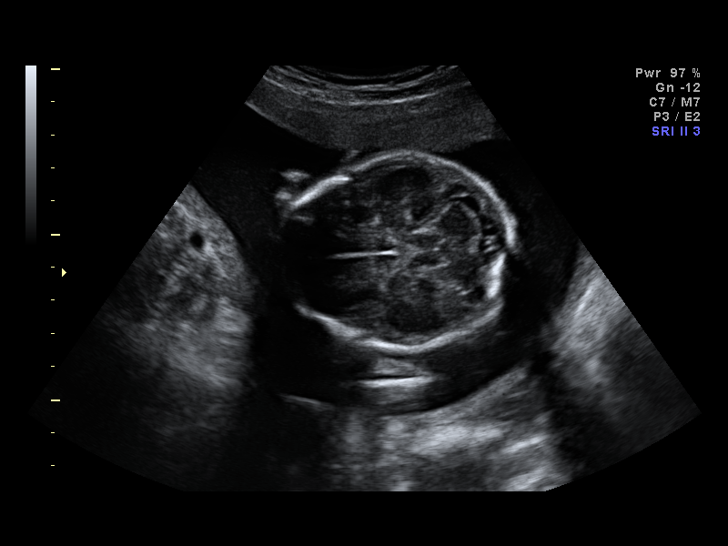
[im 29/52]
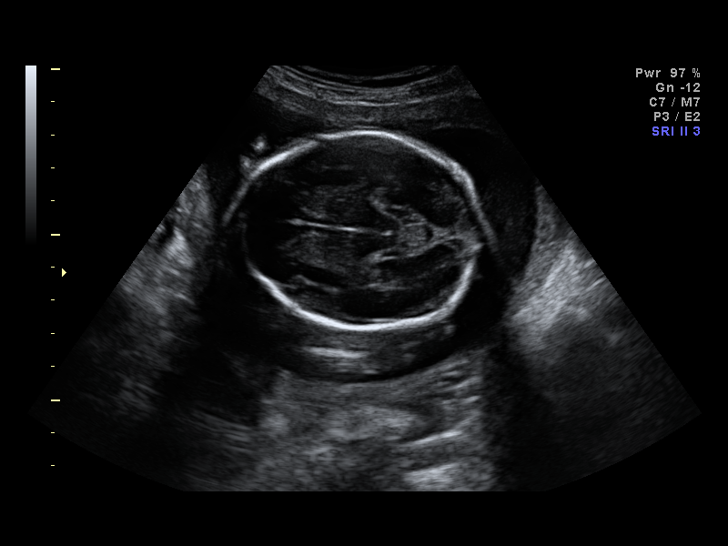
[im 33/52]
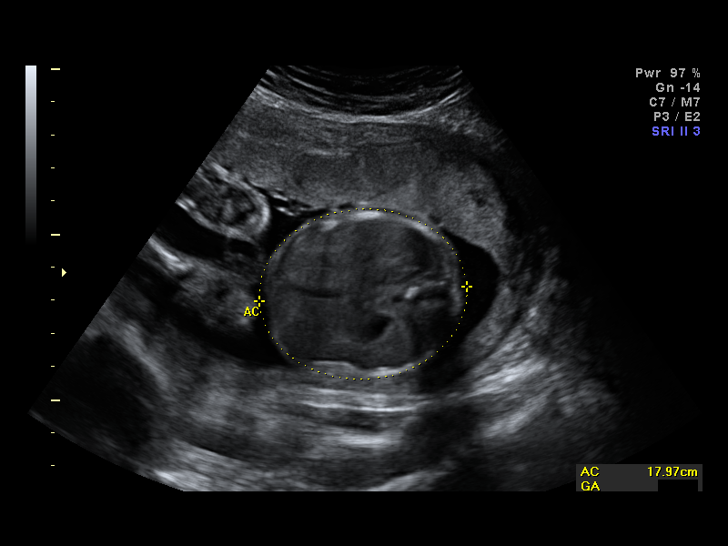
[im 36/52]
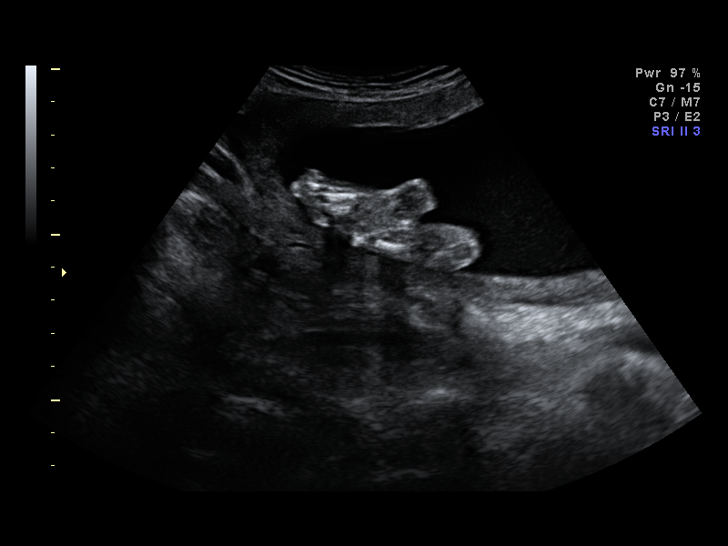
[im 40/52]
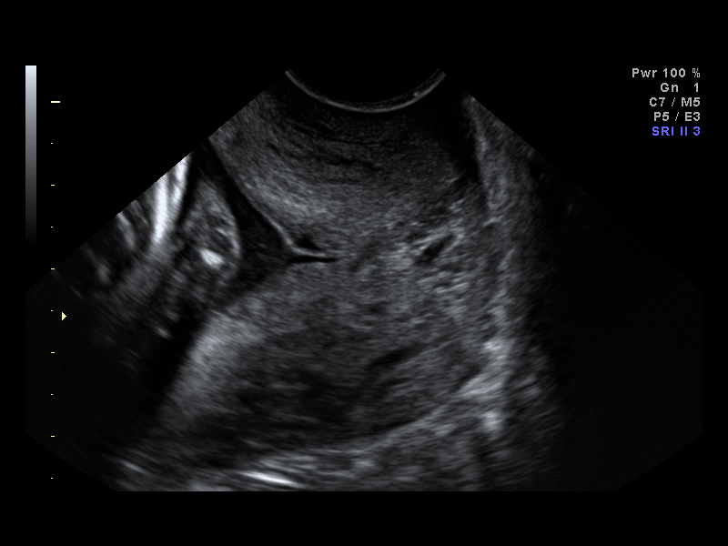
[im 44/52]
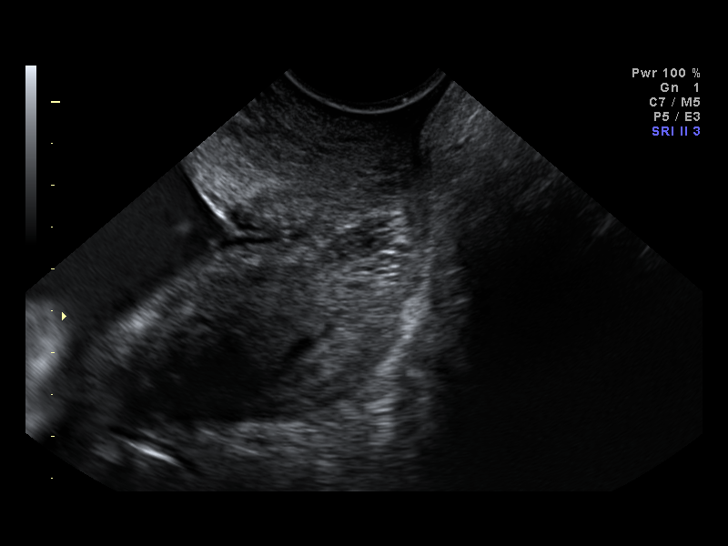
[im 48/52]
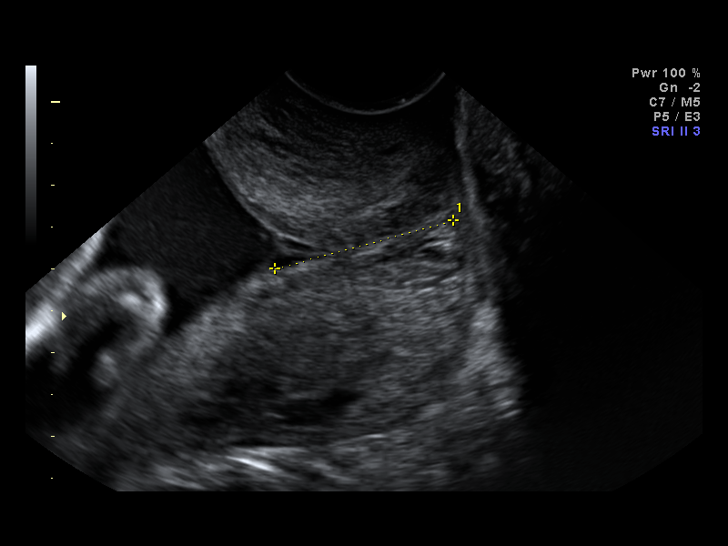
[im 52/52]
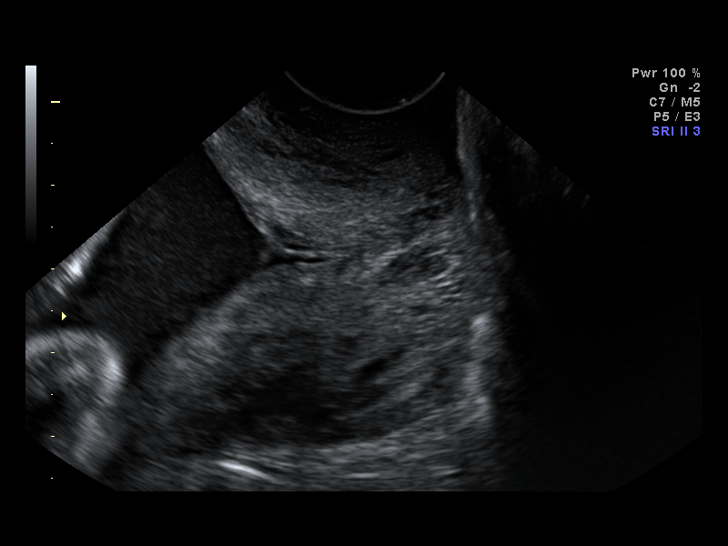

[14 of 28 positions shown; findings below may reference images not displayed]

IMPRESSION: The [facility] report has also been faxed to the ordering physician.

## 2007-11-10 ENCOUNTER — Ambulatory Visit: Payer: Self-pay | Admitting: Family Medicine

## 2007-11-16 ENCOUNTER — Ambulatory Visit (HOSPITAL_COMMUNITY): Admission: RE | Admit: 2007-11-16 | Discharge: 2007-11-16 | Payer: Self-pay | Admitting: Family Medicine

## 2007-11-16 IMAGING — US US OB TRANSVAGINAL
1 series · 9 of 9 positions shown · non-contrast
Comparison: none

OBSTETRICAL ULTRASOUND:
 This ultrasound was performed in The [[facility]OSP[facility]], and the [facility] report will be stored to [REDACTED] PACS.

[Series 1: us ob transvaginal · 9 of 9 slices shown]
[im 1/9]
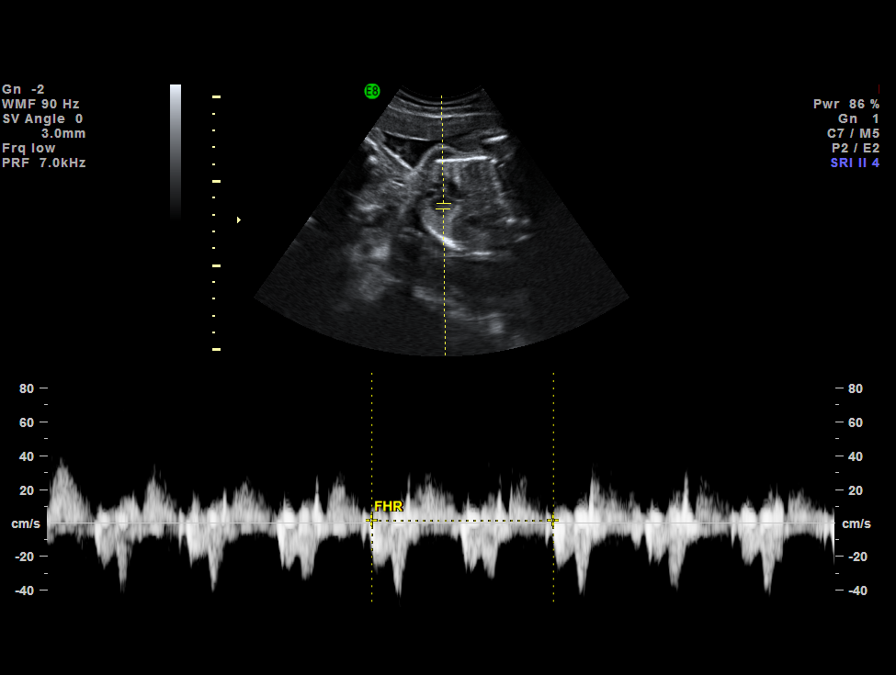
[im 2/9]
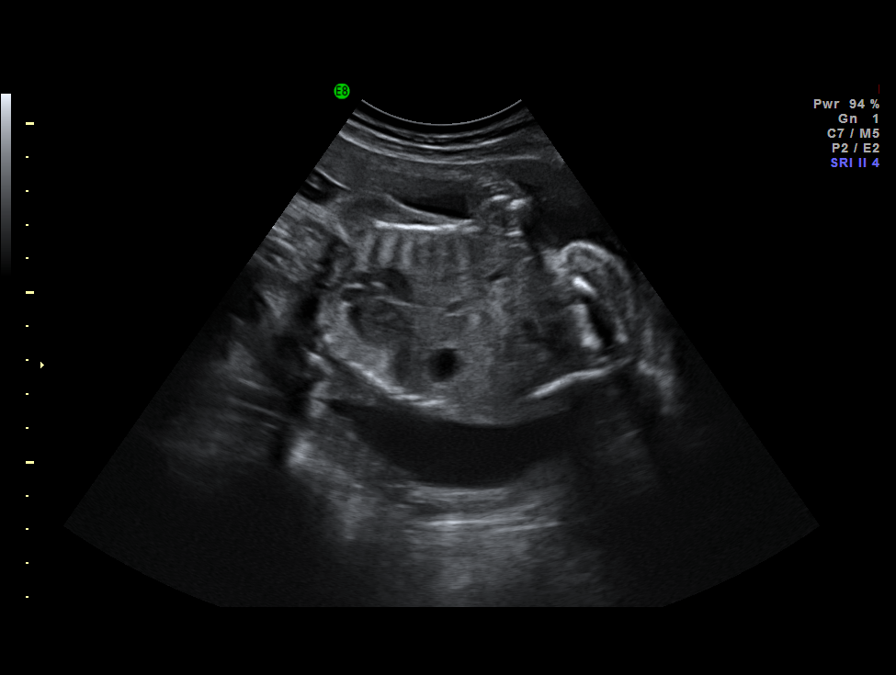
[im 3/9]
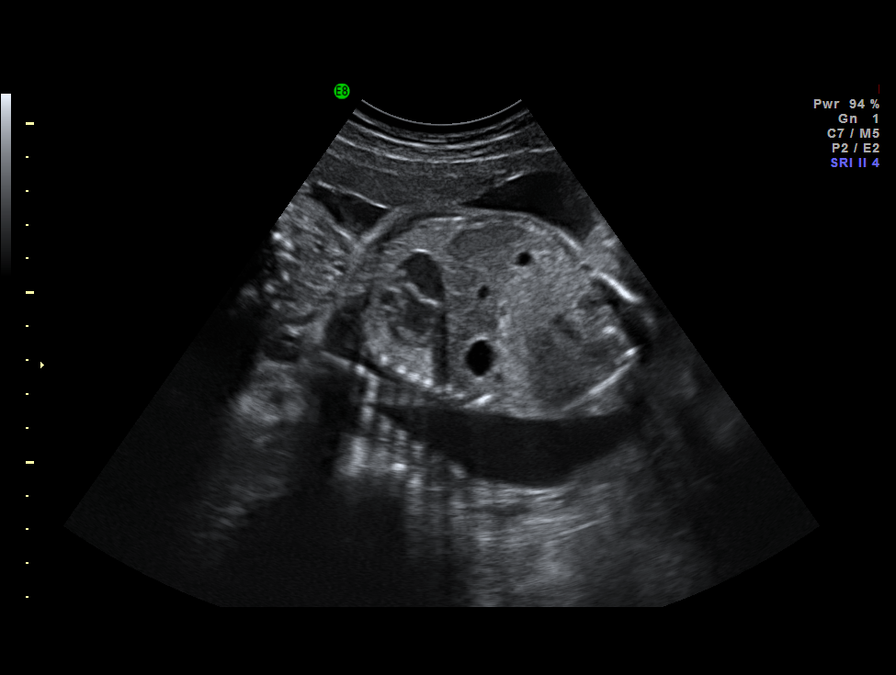
[im 4/9]
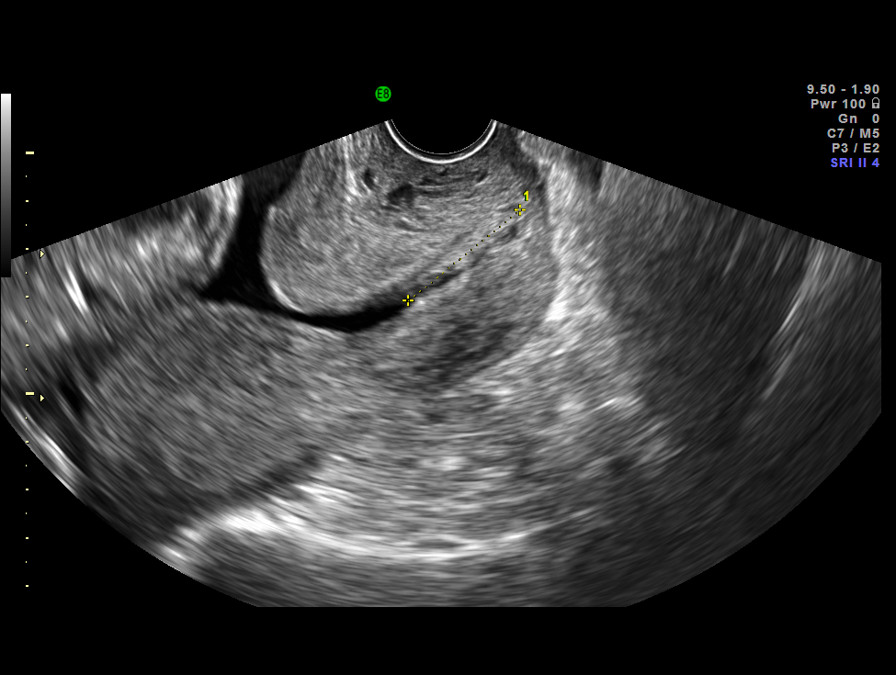
[im 5/9]
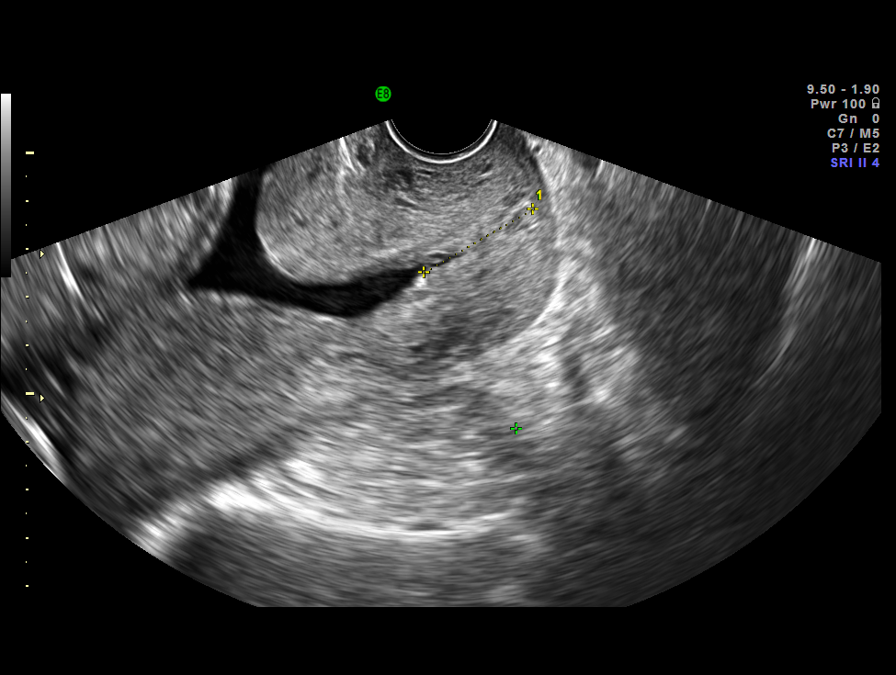
[im 6/9]
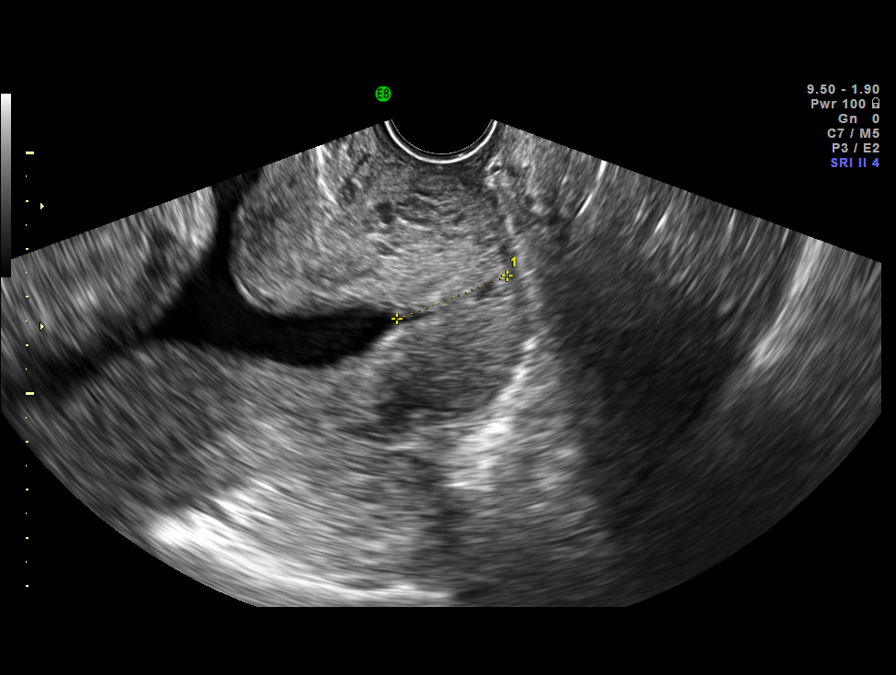
[im 7/9]
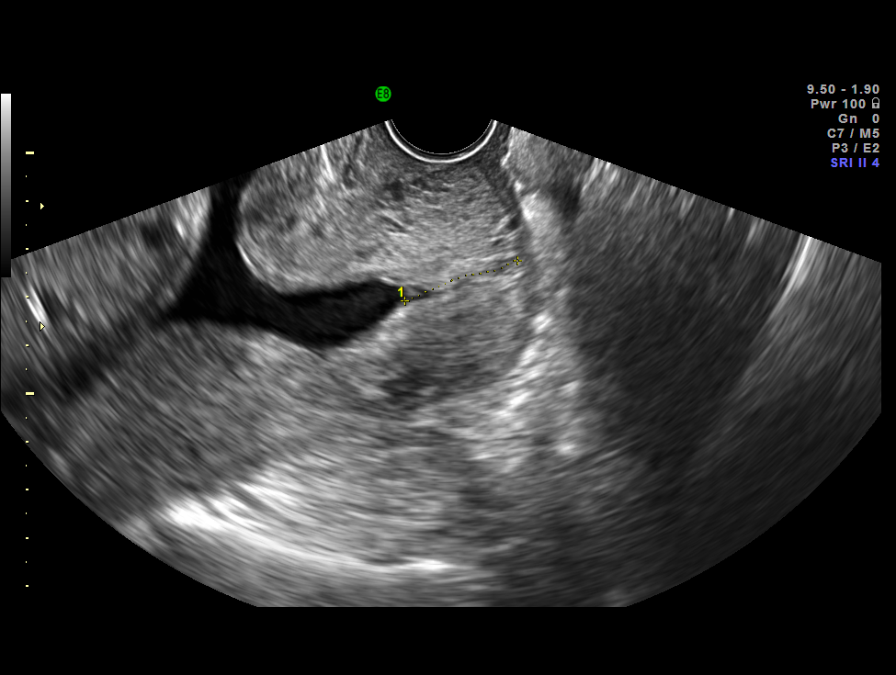
[im 8/9]
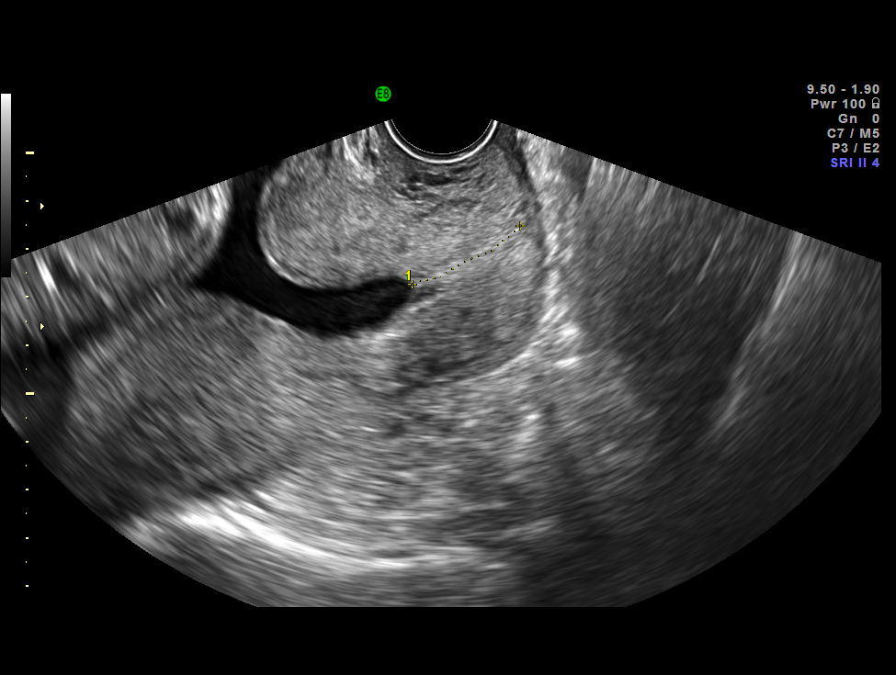
[im 9/9]
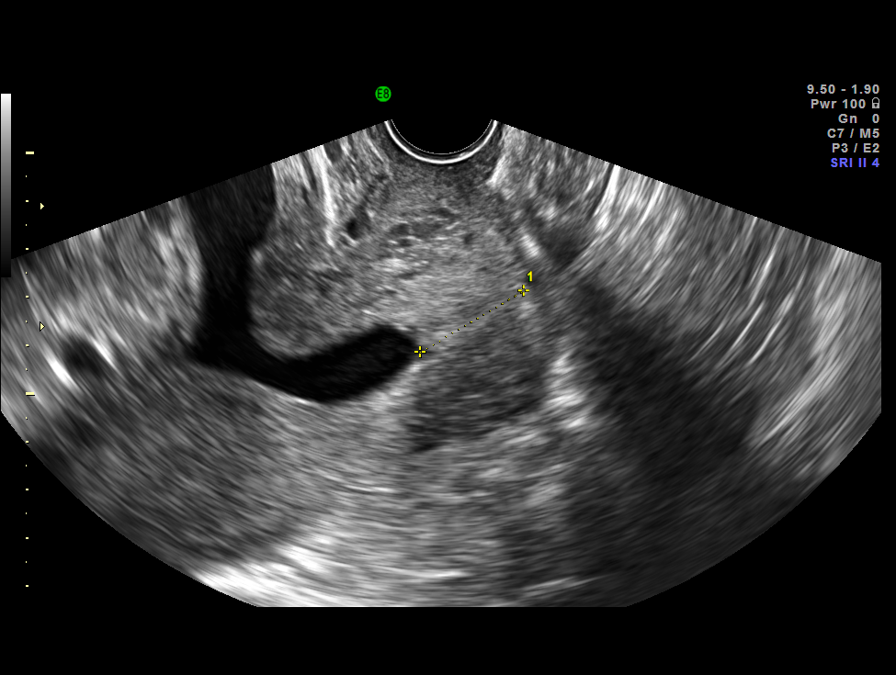

[9 of 9 positions shown; findings below may reference images not displayed]

IMPRESSION: [facility] has also been faxed to the ordering physician.

## 2007-11-23 ENCOUNTER — Ambulatory Visit (HOSPITAL_COMMUNITY): Admission: RE | Admit: 2007-11-23 | Discharge: 2007-11-23 | Payer: Self-pay | Admitting: Family Medicine

## 2007-11-23 IMAGING — US US OB FOLLOW-UP
1 series · 14 of 28 positions shown · non-contrast
Comparison: none

OBSTETRICAL ULTRASOUND:
 This ultrasound was performed in The [[facility]OSP[facility]], and the [facility] report will be stored to [REDACTED] PACS.

[Series 1: us ob follow-up · 14 of 38 slices shown]
[im 2/38]
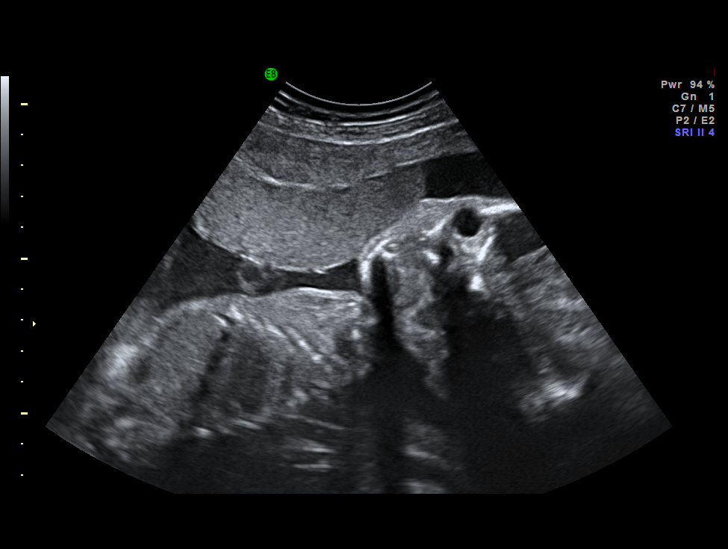
[im 5/38]
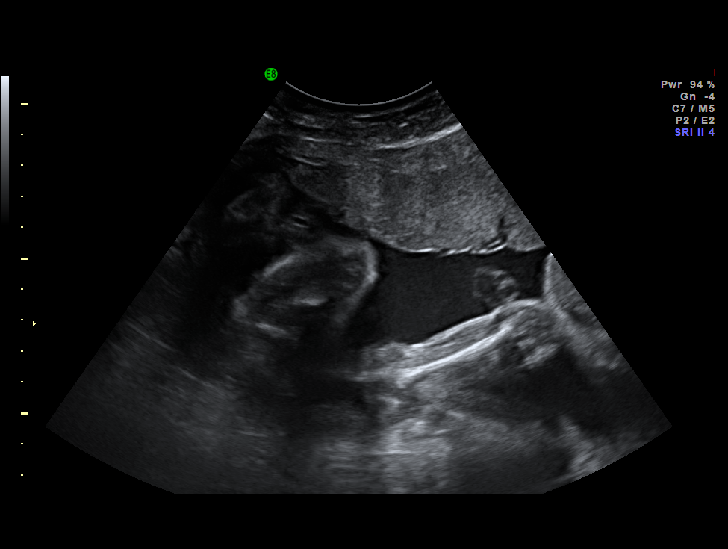
[im 7/38]
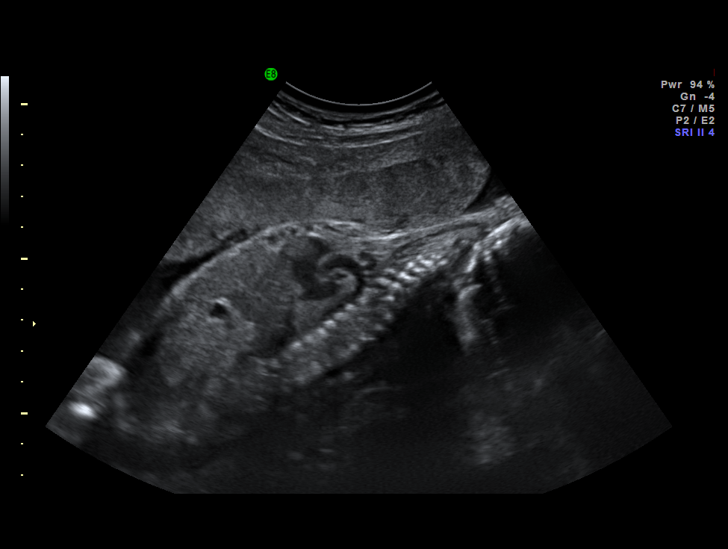
[im 10/38]
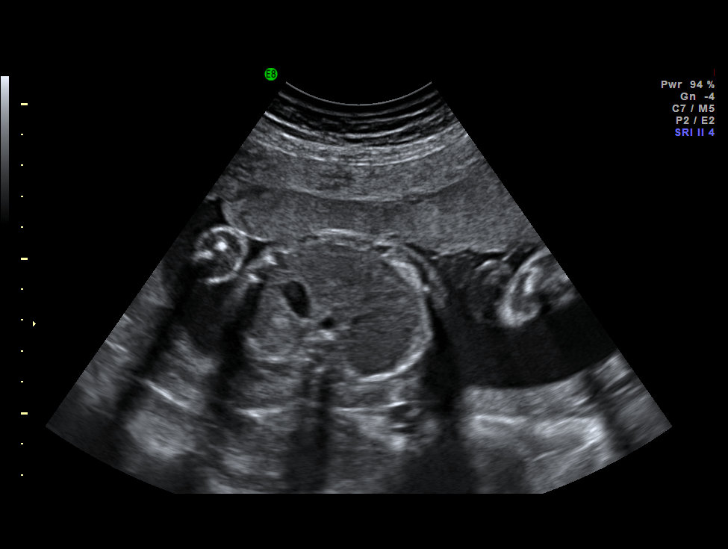
[im 13/38]
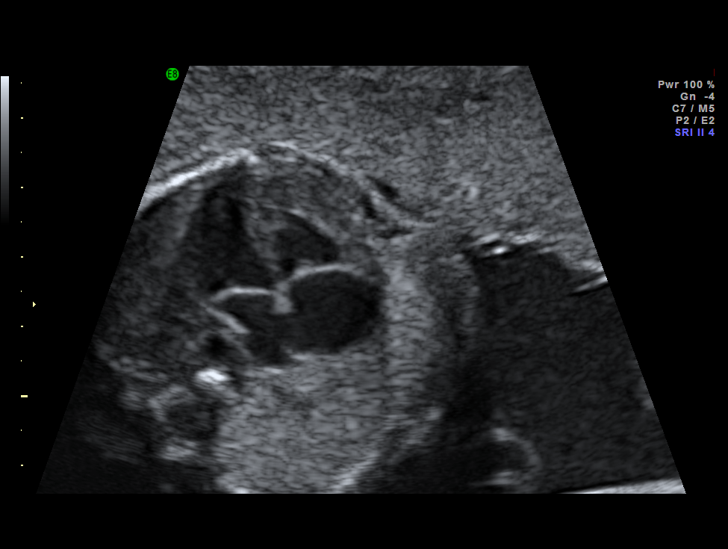
[im 16/38]
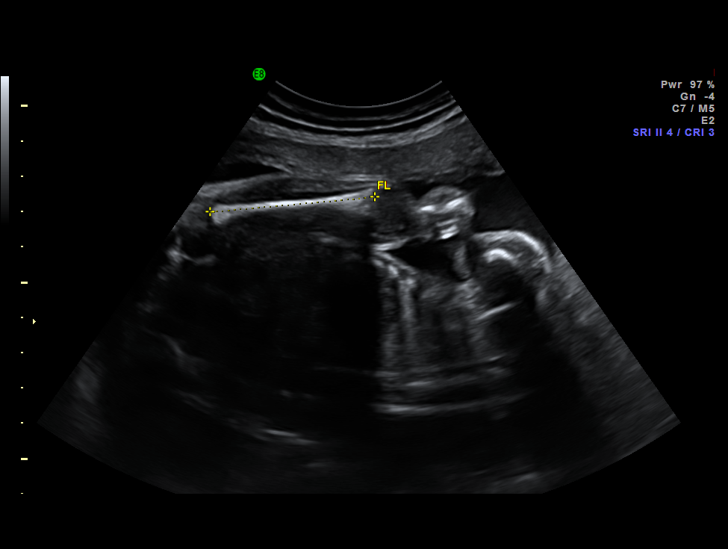
[im 18/38]
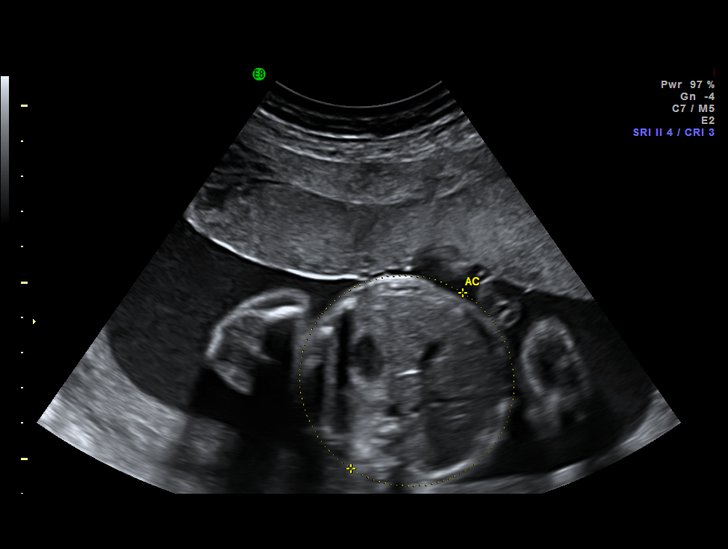
[im 21/38]
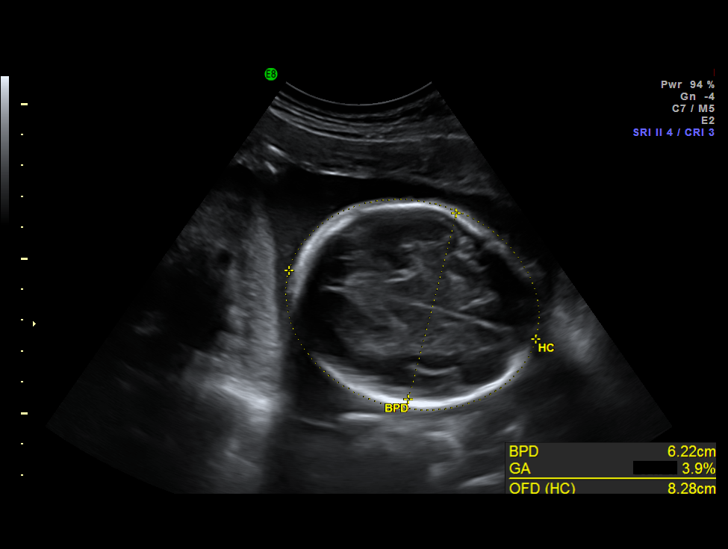
[im 24/38]
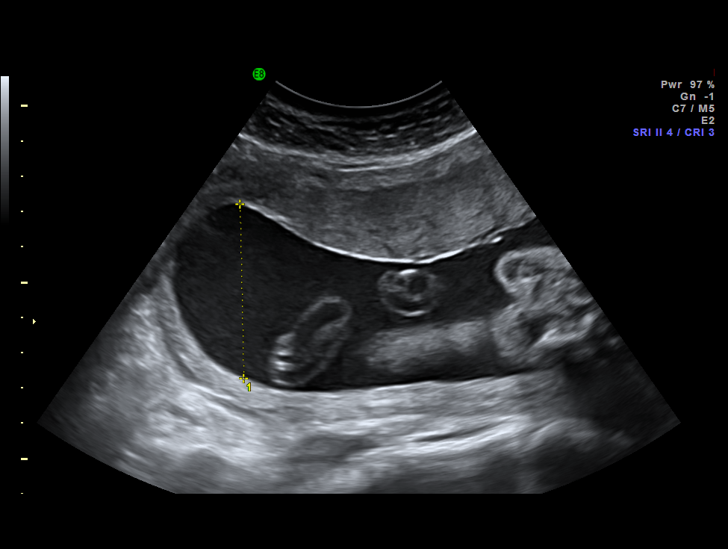
[im 27/38]
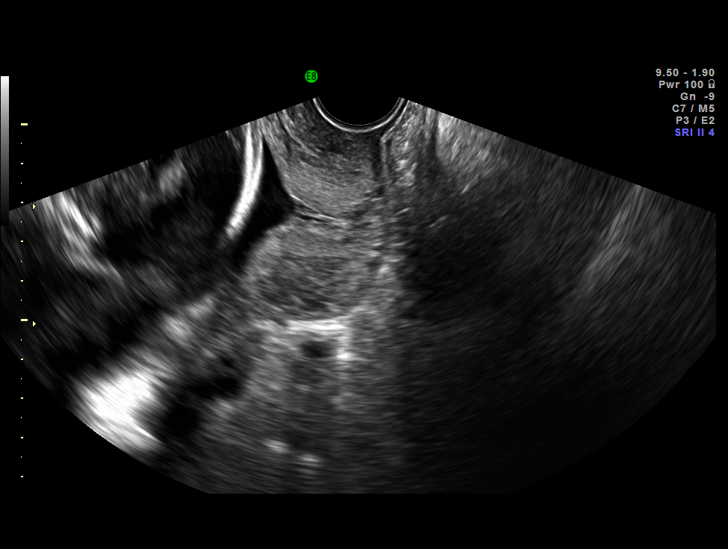
[im 29/38]
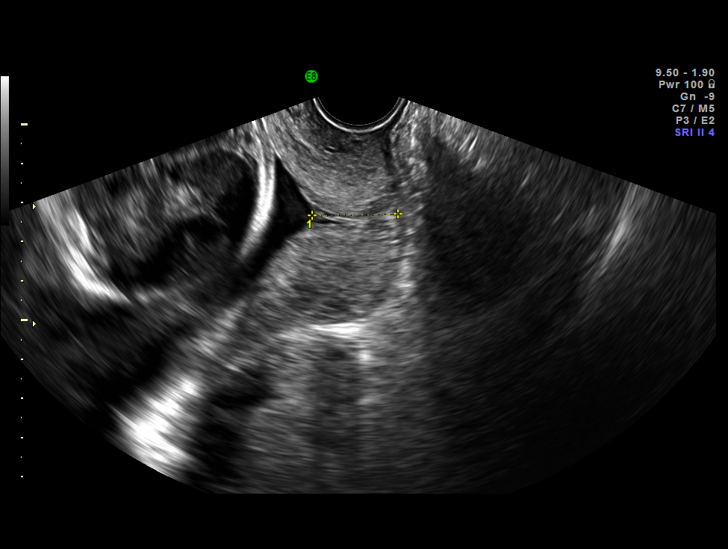
[im 32/38]
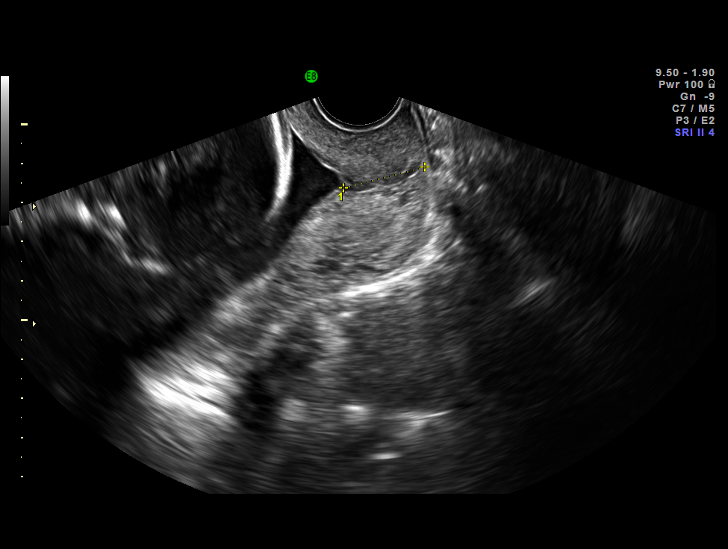
[im 35/38]
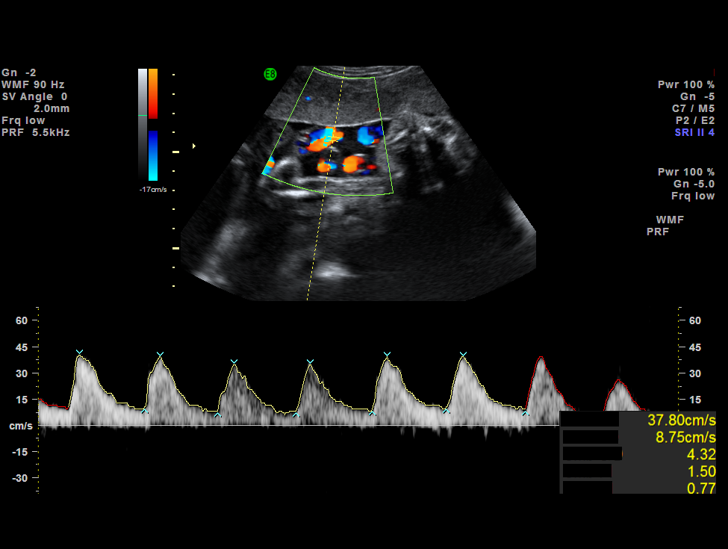
[im 38/38]
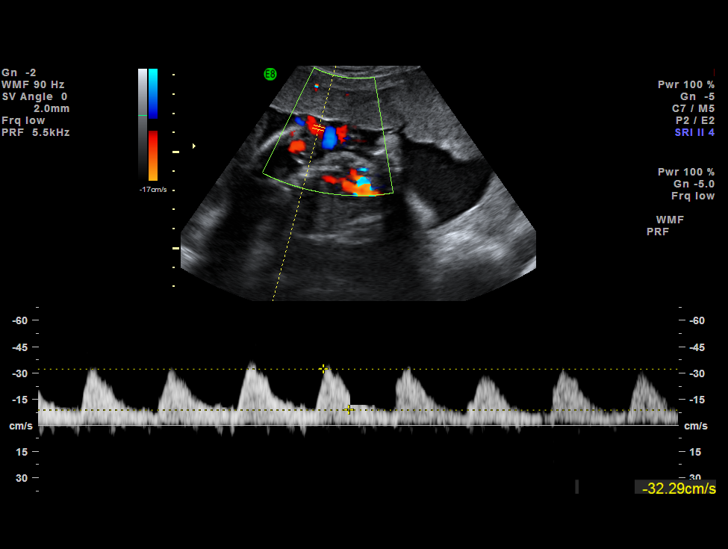

[14 of 28 positions shown; findings below may reference images not displayed]

IMPRESSION: [facility] has also been faxed to the ordering physician.

## 2007-11-24 ENCOUNTER — Ambulatory Visit: Payer: Self-pay | Admitting: Family Medicine

## 2007-11-25 ENCOUNTER — Ambulatory Visit: Payer: Self-pay | Admitting: Obstetrics & Gynecology

## 2007-11-30 ENCOUNTER — Ambulatory Visit (HOSPITAL_COMMUNITY): Admission: RE | Admit: 2007-11-30 | Discharge: 2007-11-30 | Payer: Self-pay | Admitting: Family Medicine

## 2007-11-30 IMAGING — US US FETAL BPP W/O NONSTRESS
1 series · 14 of 28 positions shown · non-contrast
Comparison: none

OBSTETRICAL ULTRASOUND:
 This ultrasound was performed in The [[facility]OSP[facility]], and the [facility] report will be stored to [REDACTED] PACS.

[Series 1: us fetal bpp w/o nonstress · 28 acquisitions, 14 frames shown]
[im 2/28]
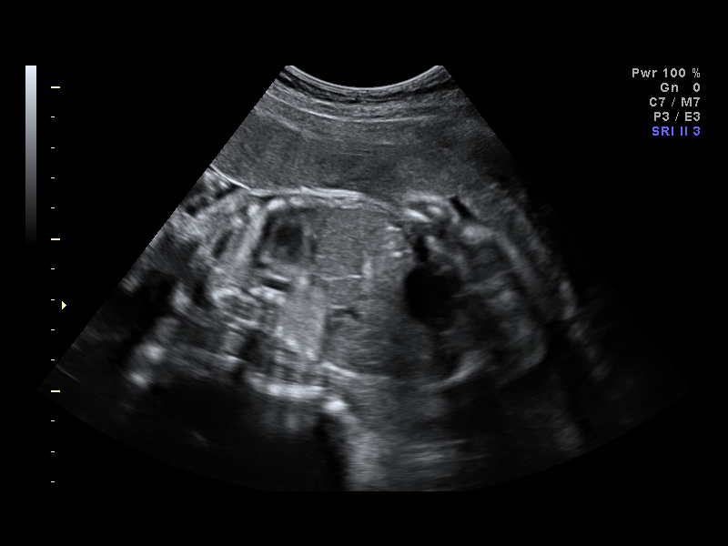
[im 4/28]
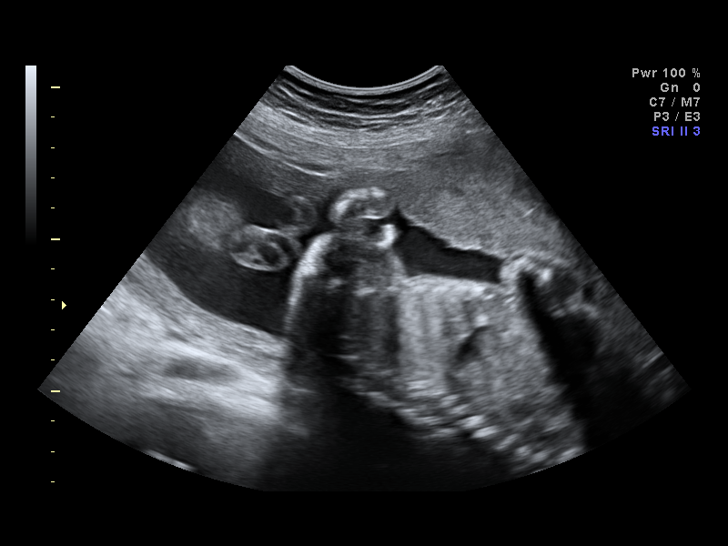
[im 6/28]
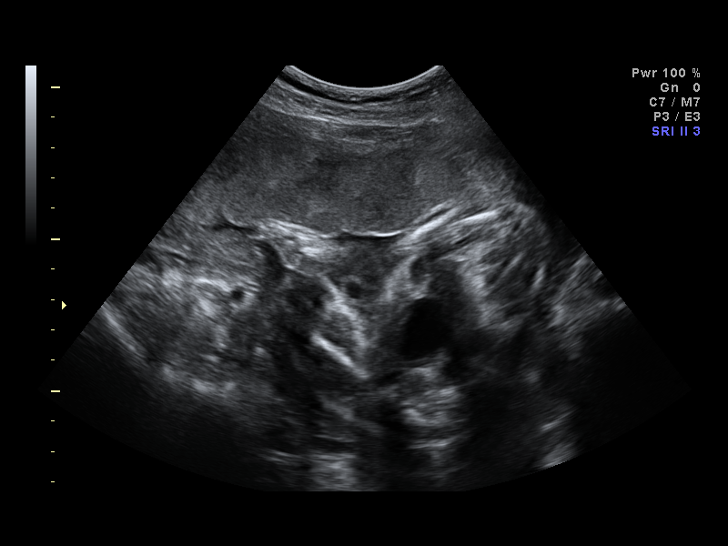
[im 8/28]
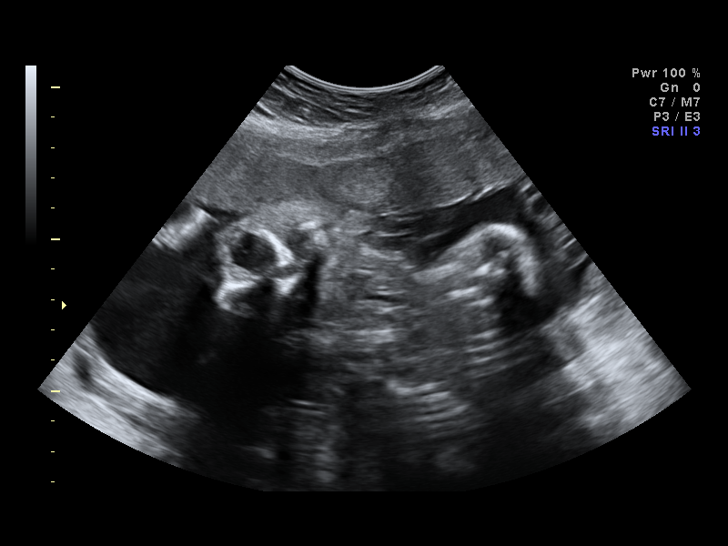
[im 10/28]
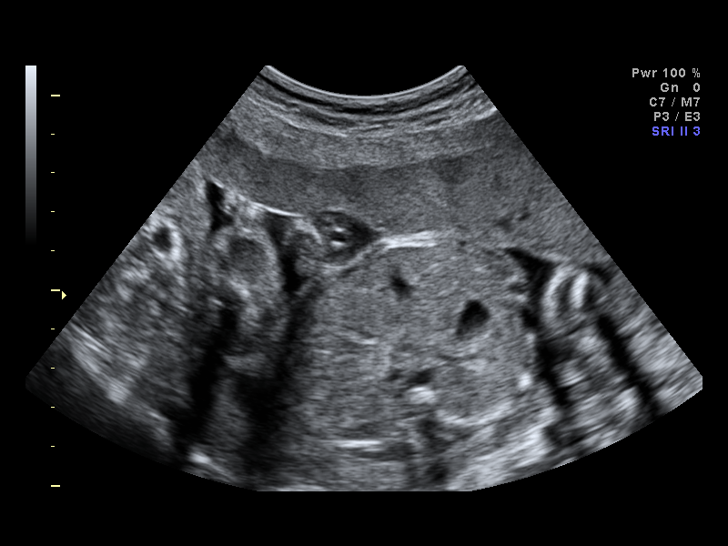
[im 12/28]
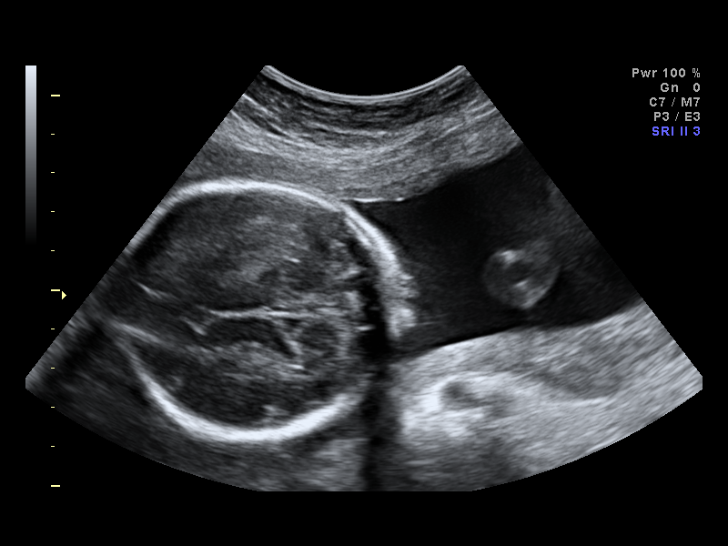
[im 14/28]
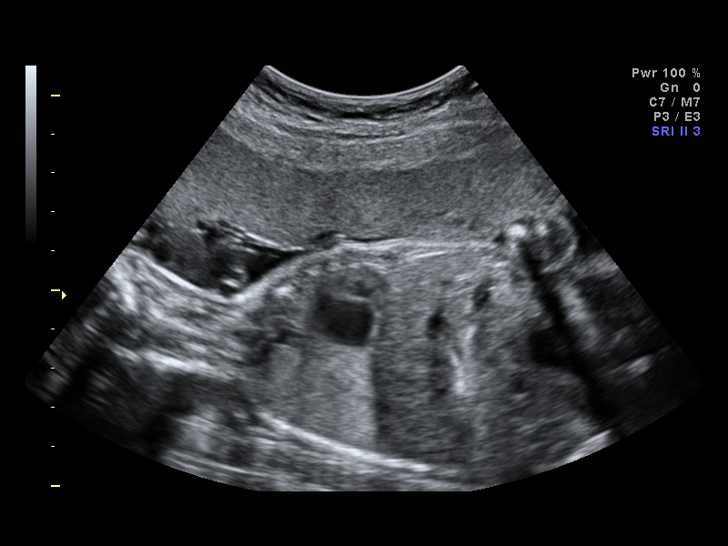
[im 16/28]
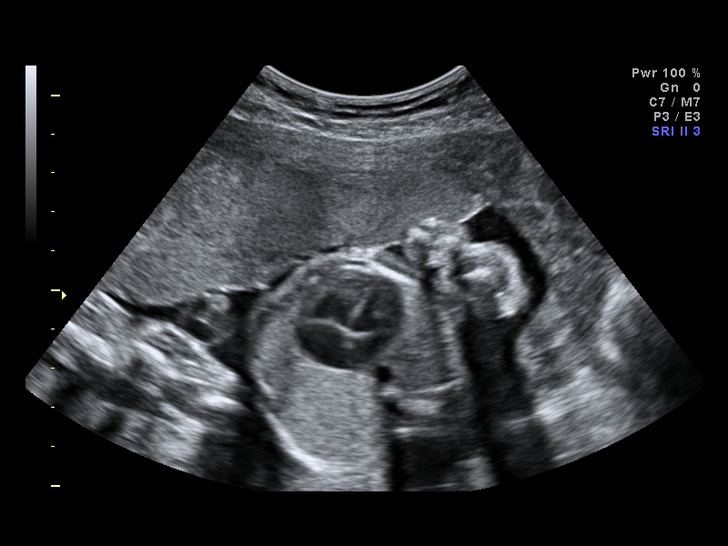
[im 18/28]
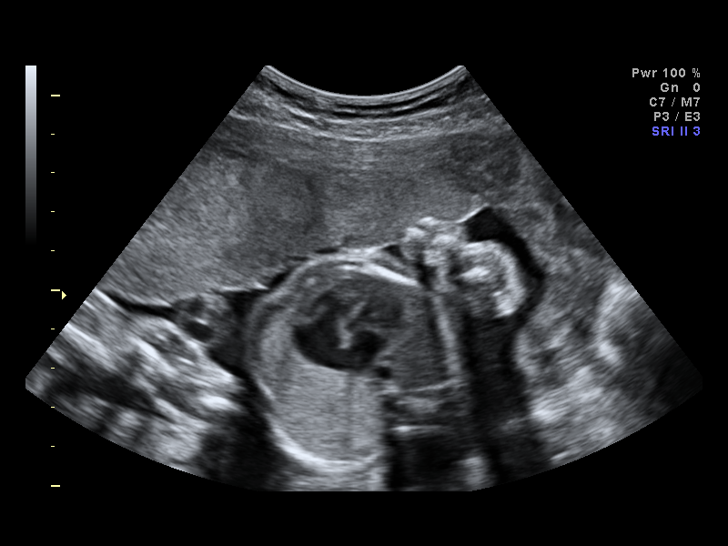
[im 20/28]
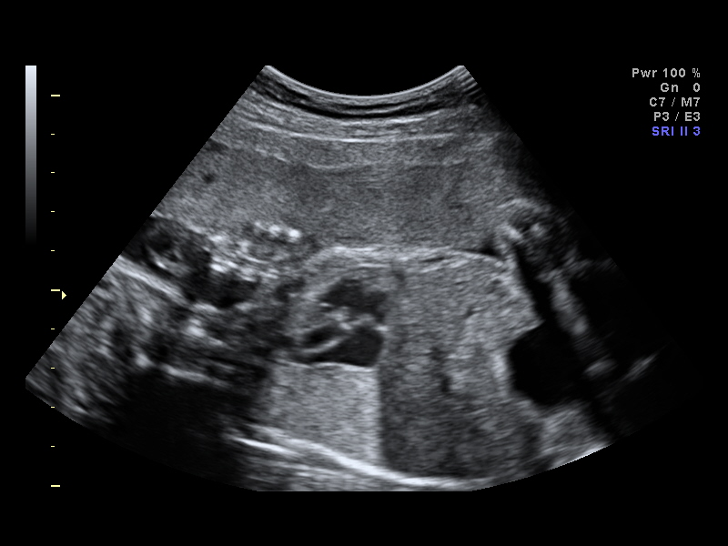
[im 22/28]
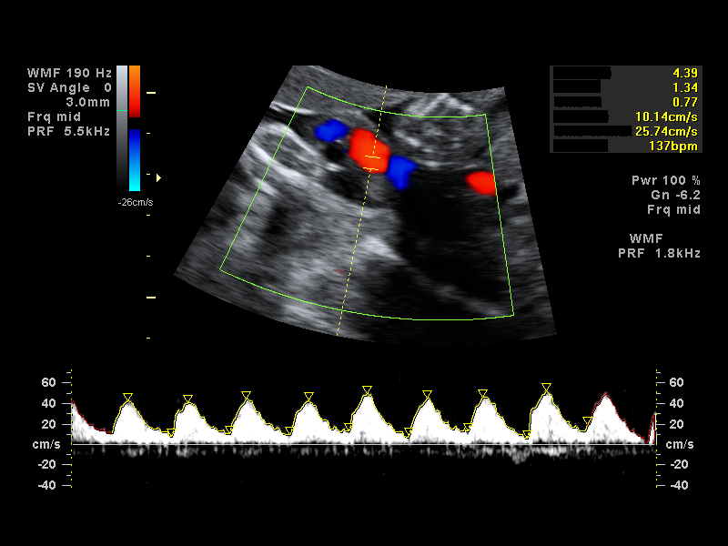
[im 24/28]
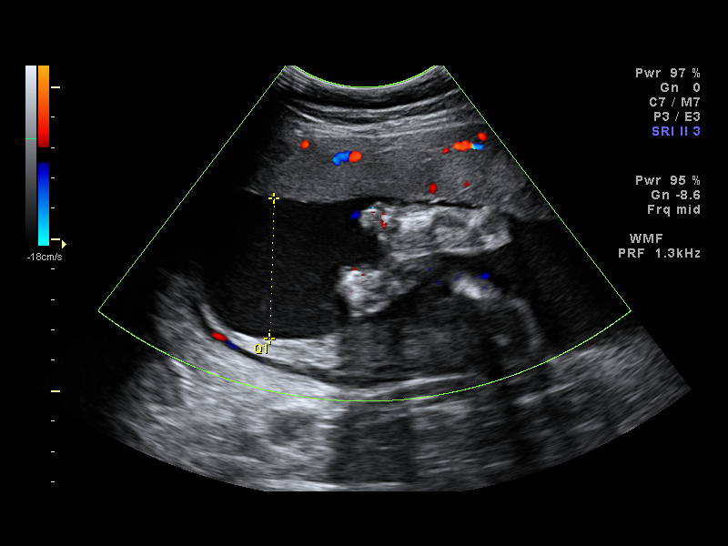
[im 26/28]
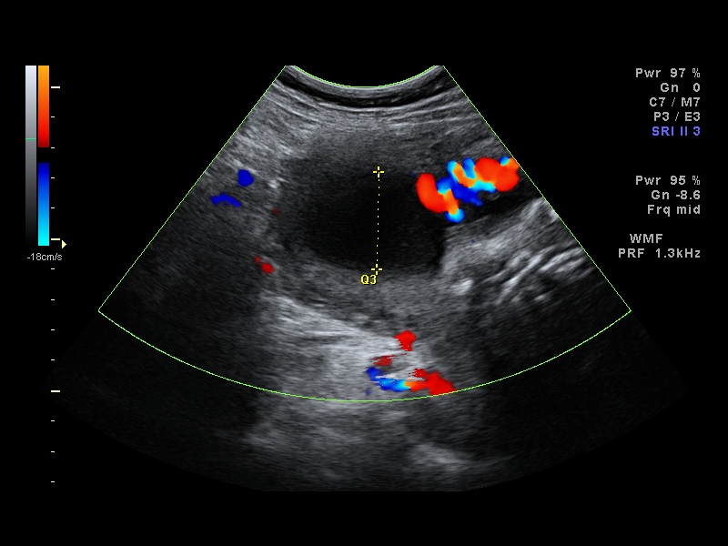
[im 28/28]
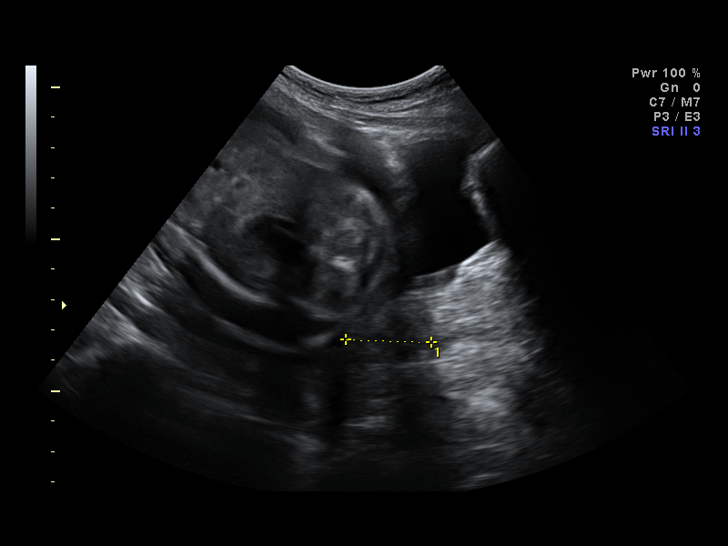

[14 of 28 positions shown; findings below may reference images not displayed]

IMPRESSION: [facility] has also been faxed to the ordering physician.

## 2007-12-07 ENCOUNTER — Encounter: Payer: Self-pay | Admitting: *Deleted

## 2007-12-07 ENCOUNTER — Inpatient Hospital Stay (HOSPITAL_COMMUNITY): Admission: AD | Admit: 2007-12-07 | Discharge: 2008-01-14 | Payer: Self-pay | Admitting: Obstetrics and Gynecology

## 2007-12-07 ENCOUNTER — Ambulatory Visit: Payer: Self-pay | Admitting: Physician Assistant

## 2007-12-07 ENCOUNTER — Ambulatory Visit: Payer: Self-pay | Admitting: Gynecology

## 2007-12-07 IMAGING — US US OB FOLLOW-UP
1 series · 14 of 28 positions shown · non-contrast
Comparison: none

OBSTETRICAL ULTRASOUND:
 This ultrasound was performed in The [[facility]OSPITAL], and the [facility] report will be stored to [REDACTED] PACS.

[Series 1: us ob follow-up · 14 of 33 slices shown]
[im 2/33]
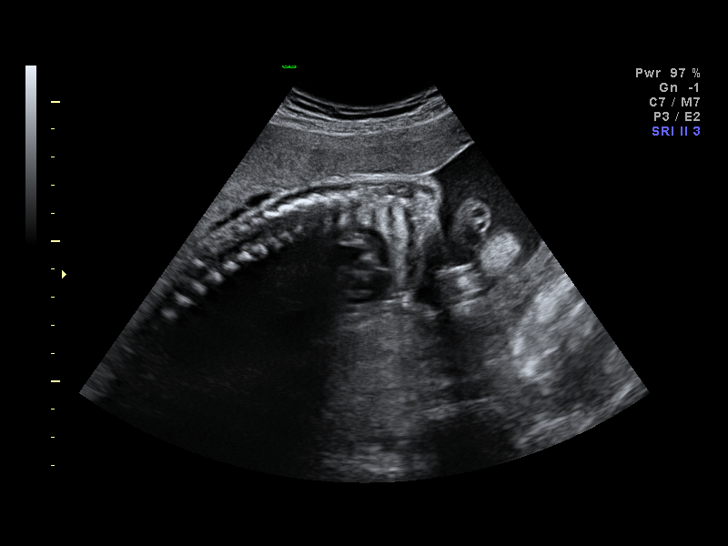
[im 4/33]
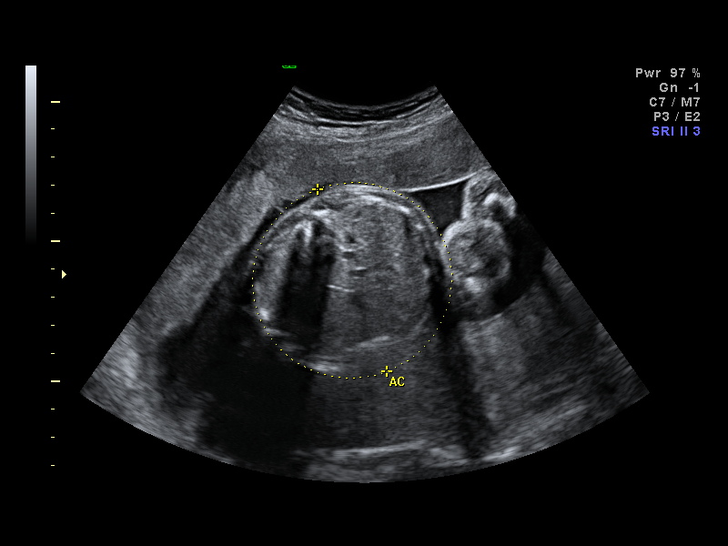
[im 6/33]
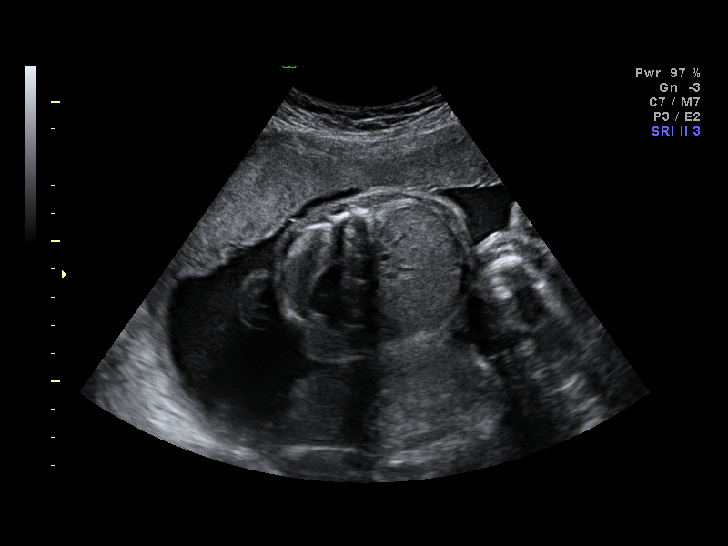
[im 9/33]
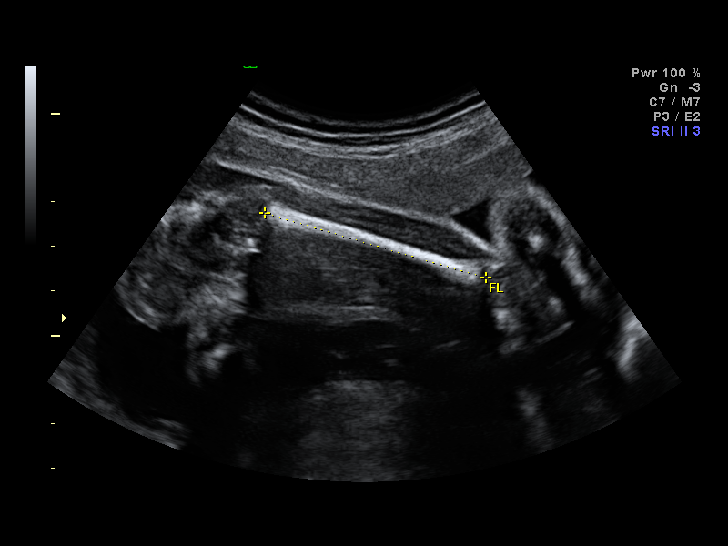
[im 11/33]
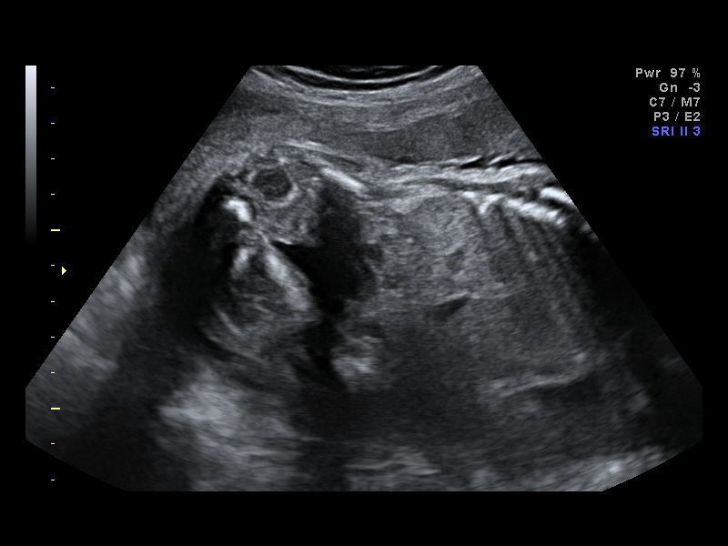
[im 14/33]
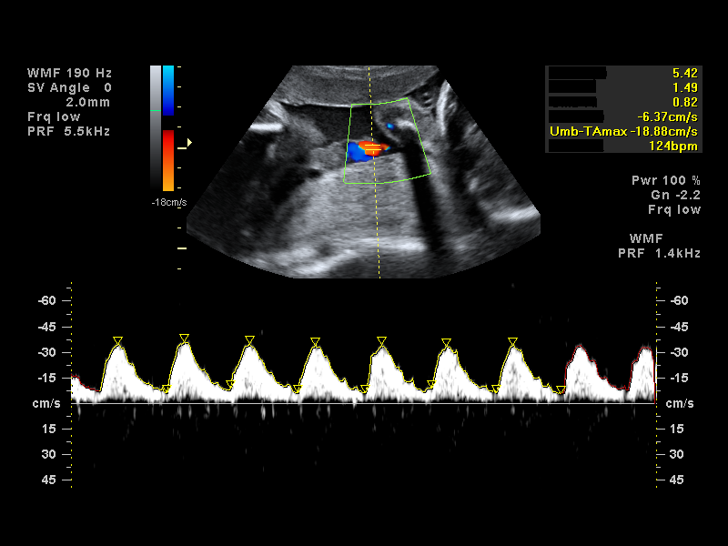
[im 16/33]
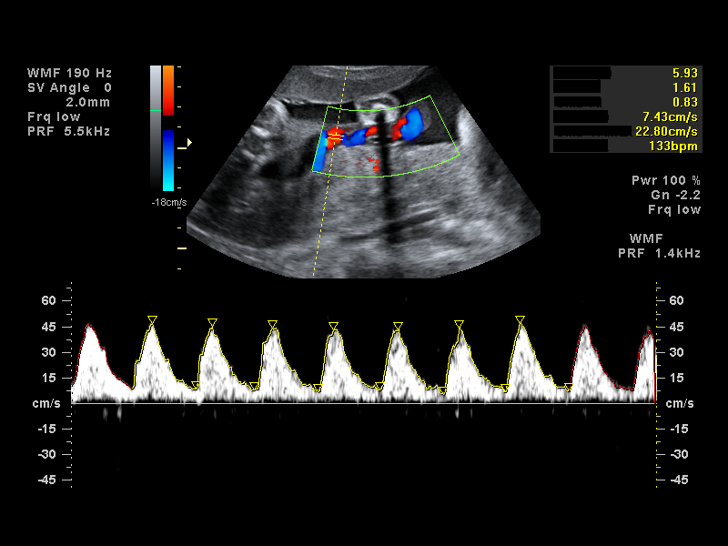
[im 18/33]
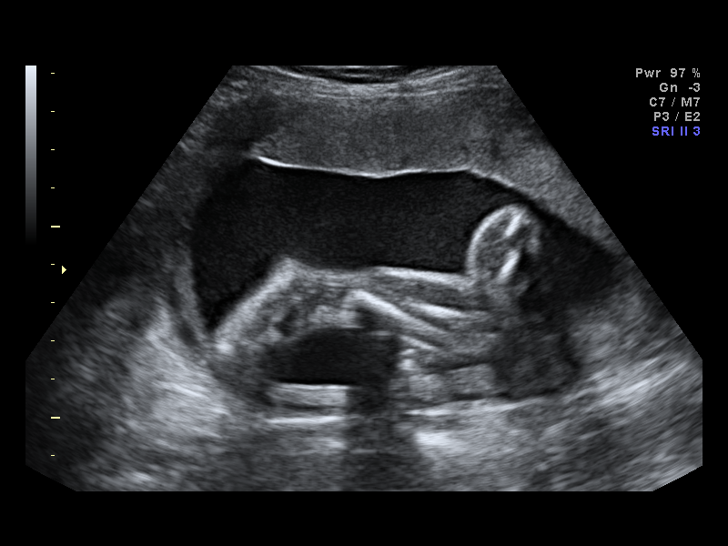
[im 21/33]
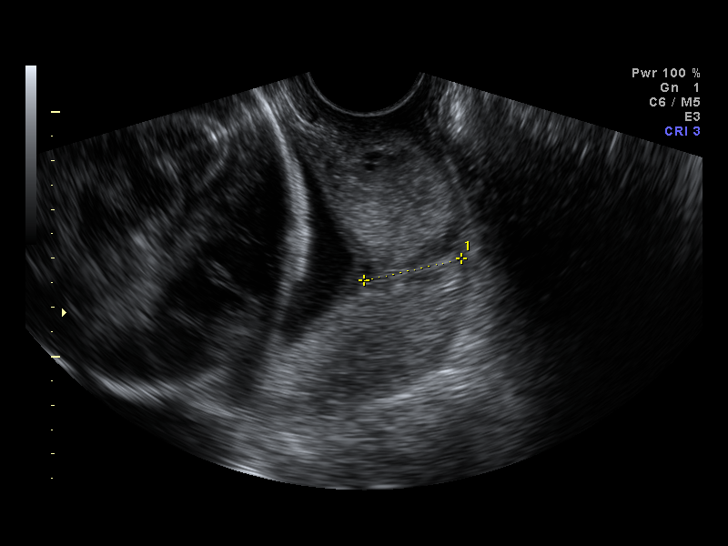
[im 23/33]
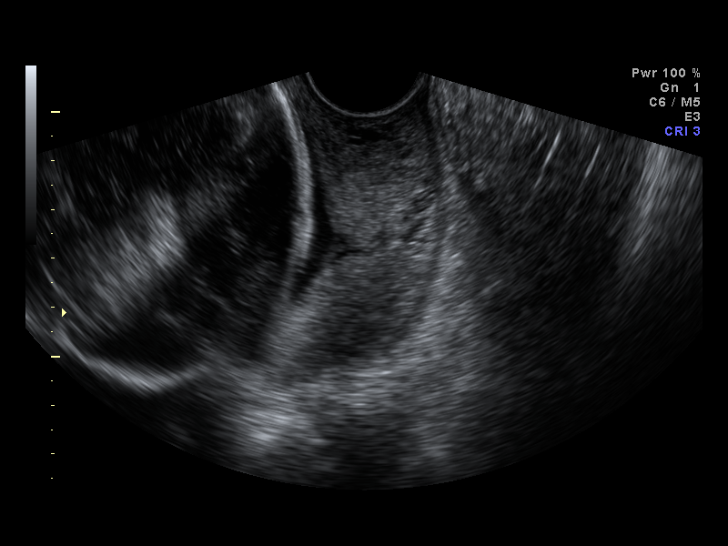
[im 25/33]
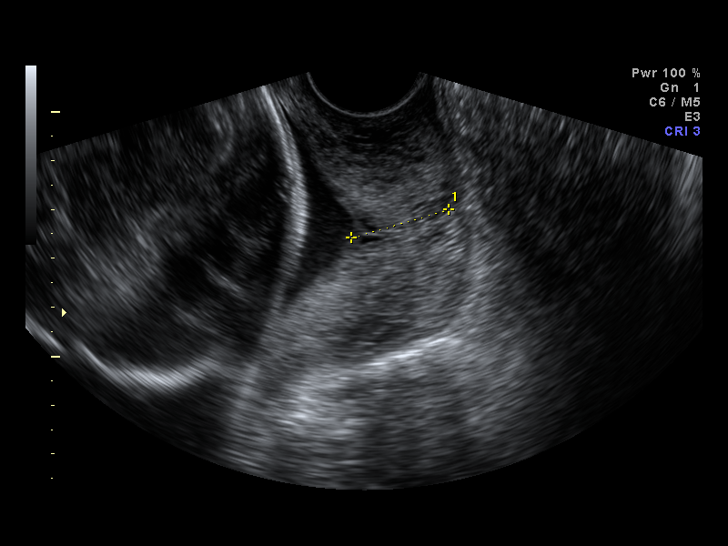
[im 28/33]
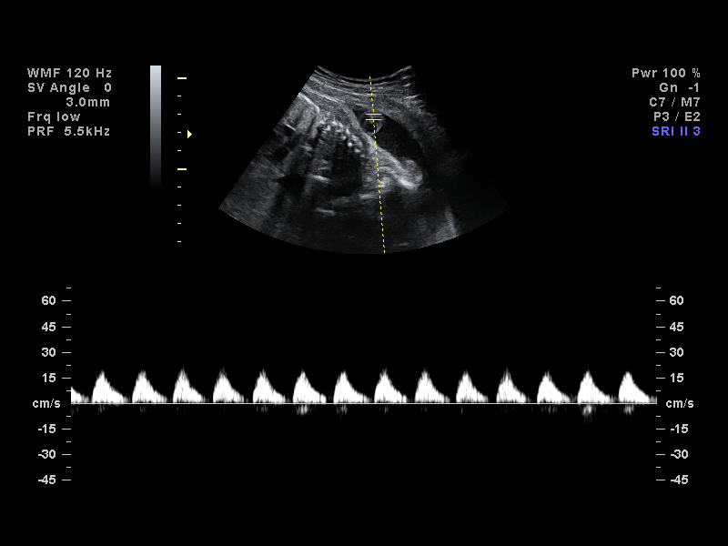
[im 30/33]
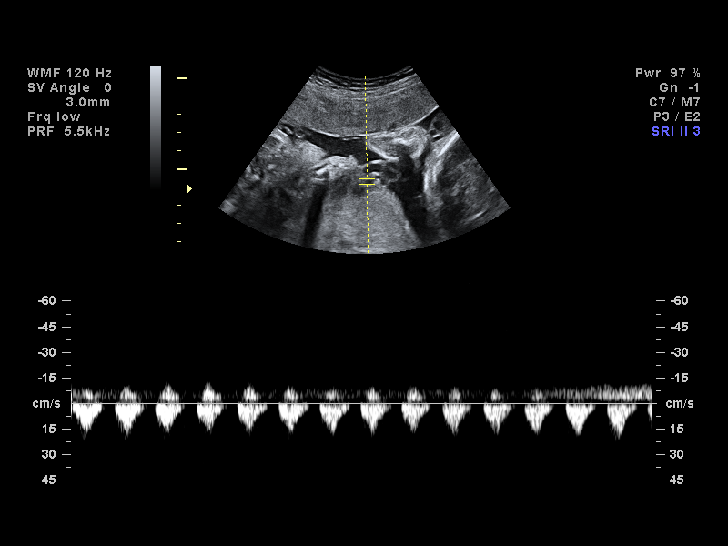
[im 33/33]
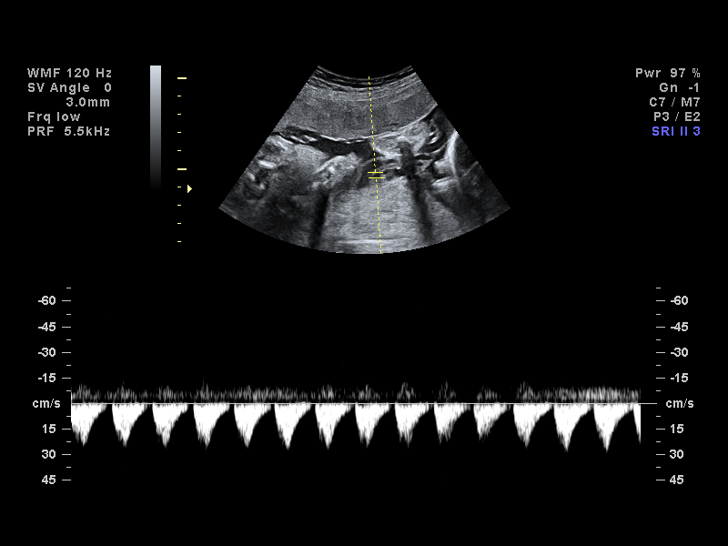

[14 of 28 positions shown; findings below may reference images not displayed]

IMPRESSION: [facility] has also been faxed to the ordering physician.

## 2007-12-08 ENCOUNTER — Encounter: Payer: Self-pay | Admitting: *Deleted

## 2007-12-08 IMAGING — US US UA DOPPLER RE-EVAL
1 series · 14 of 18 positions shown · non-contrast
Comparison: none

OBSTETRICAL ULTRASOUND:
 This ultrasound was performed in The [[facility]OSP[facility]], and the [facility] report will be stored to [REDACTED] PACS.

[Series 1: us ua doppler re-eval · 14 of 18 slices shown]
[im 1/18]
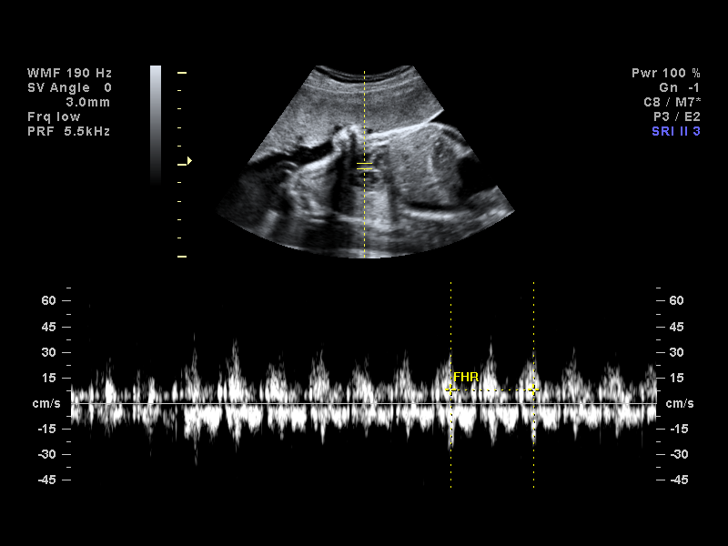
[im 2/18]
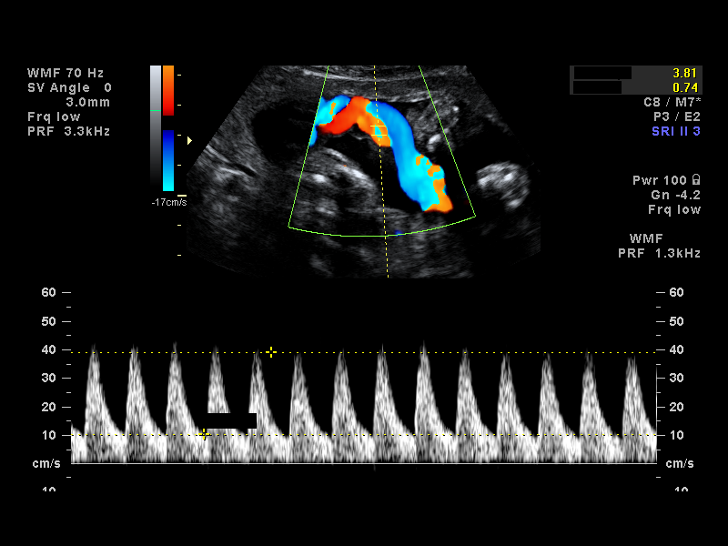
[im 4/18]
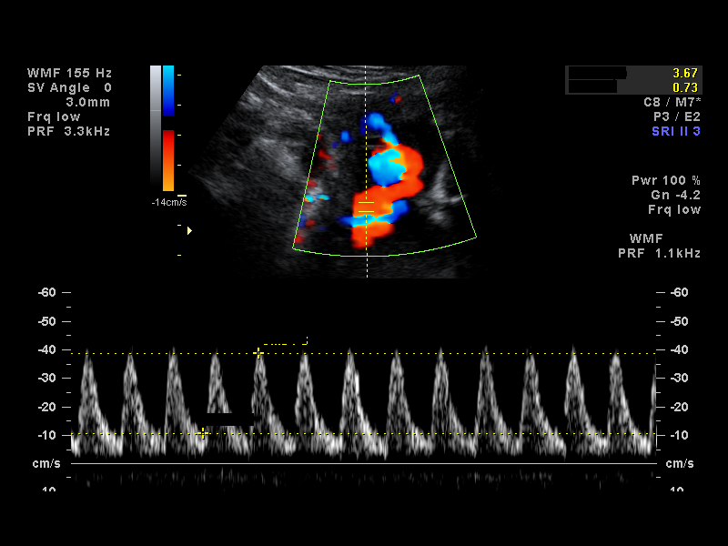
[im 5/18]
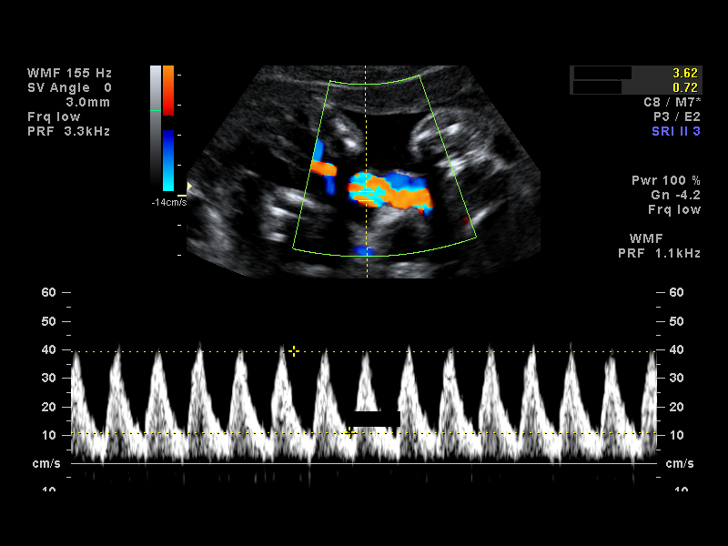
[im 6/18]
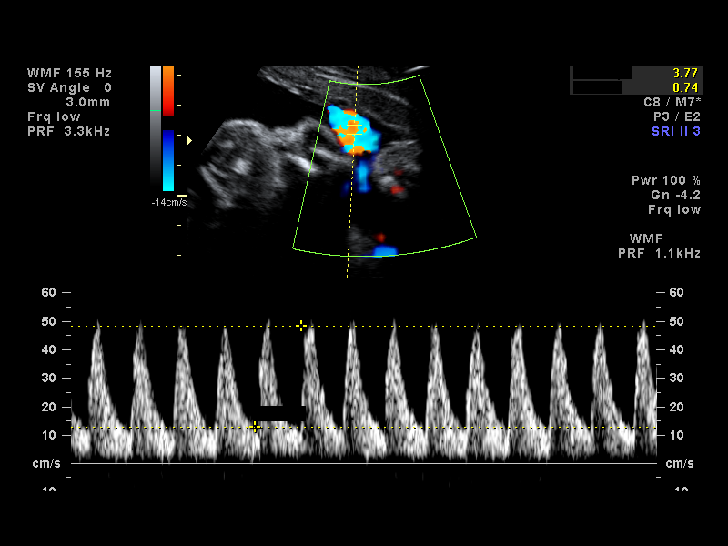
[im 8/18]
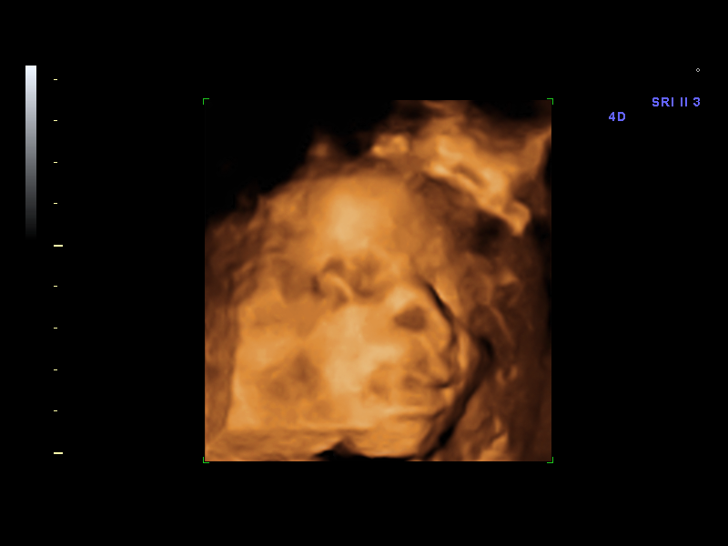
[im 9/18]
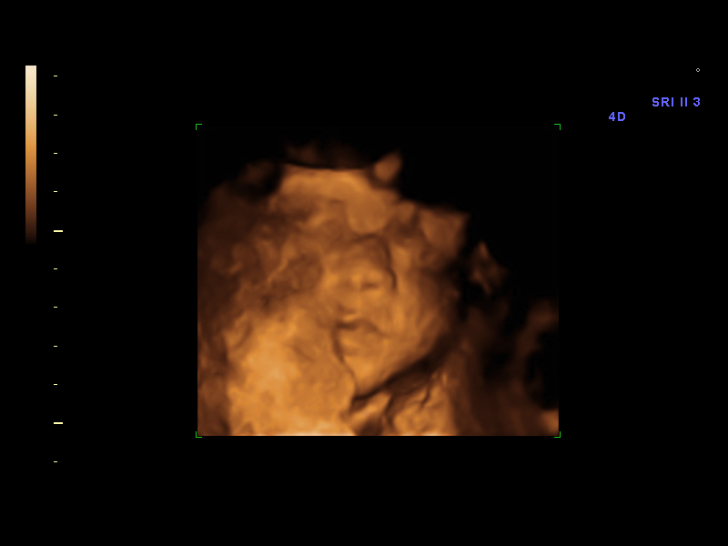
[im 10/18]
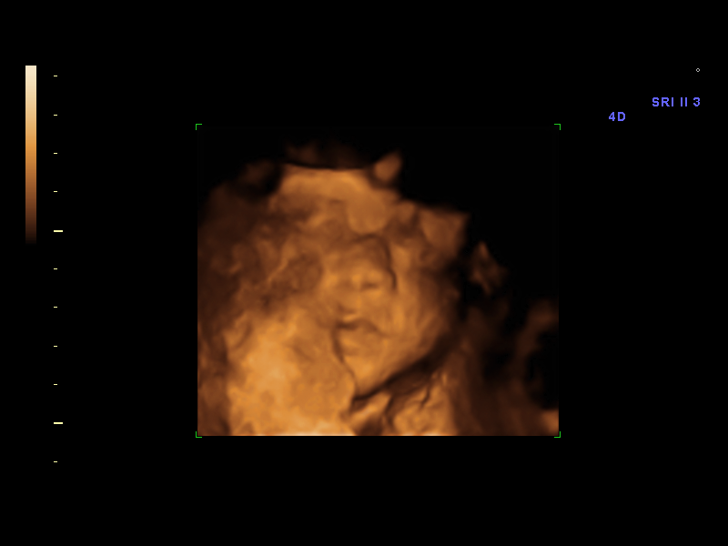
[im 11/18]
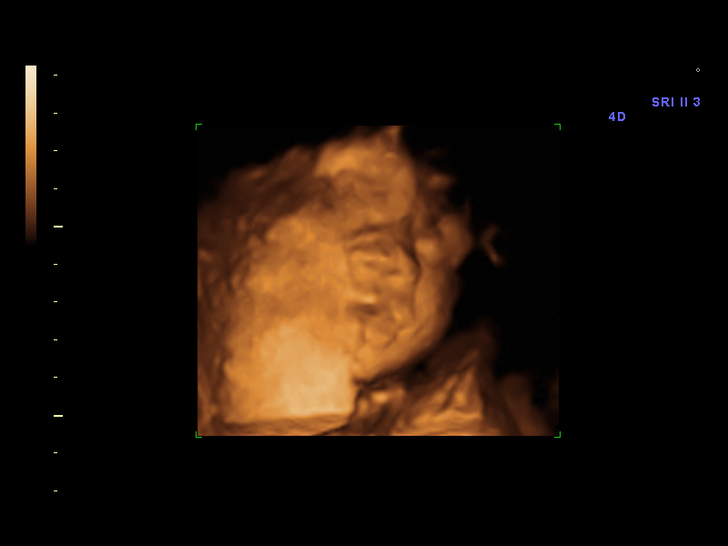
[im 13/18]
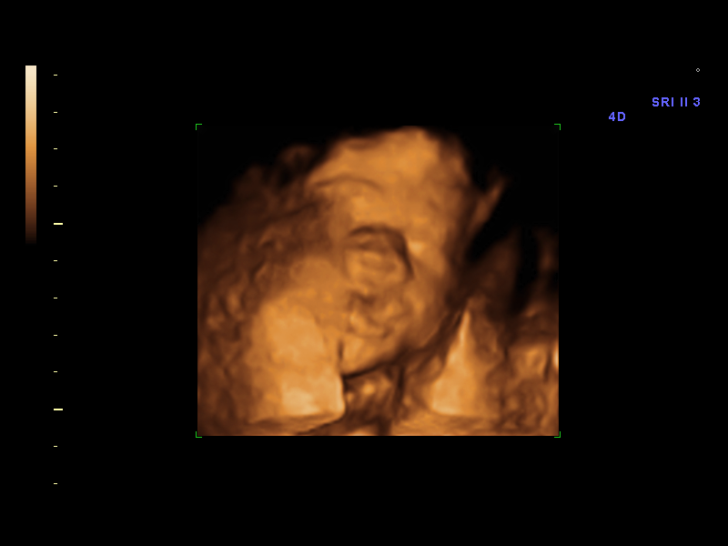
[im 14/18]
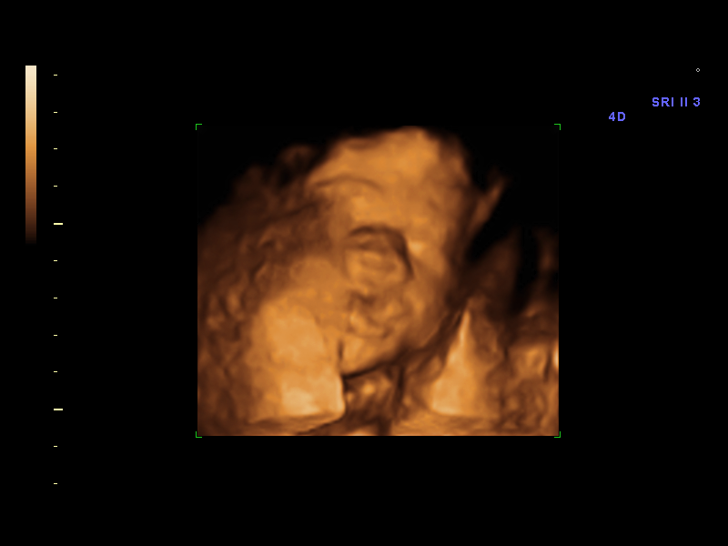
[im 15/18]
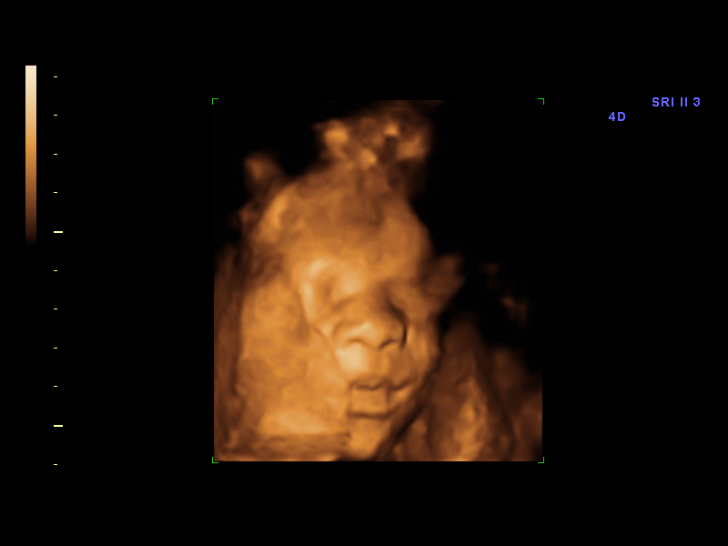
[im 17/18]
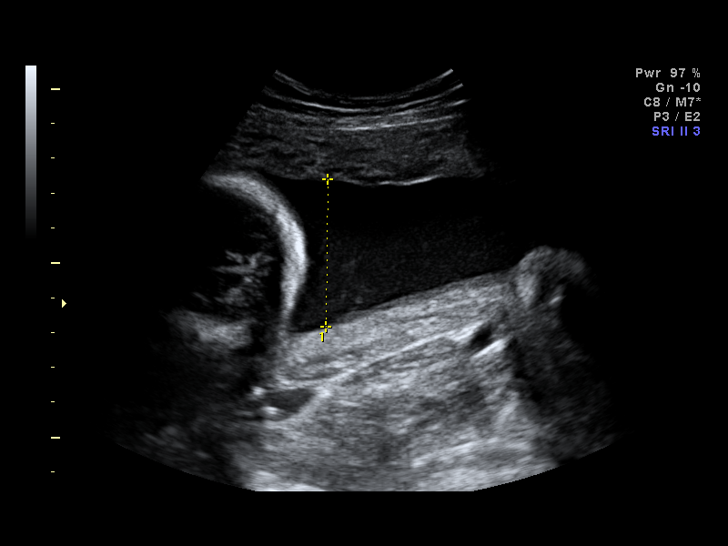
[im 18/18]
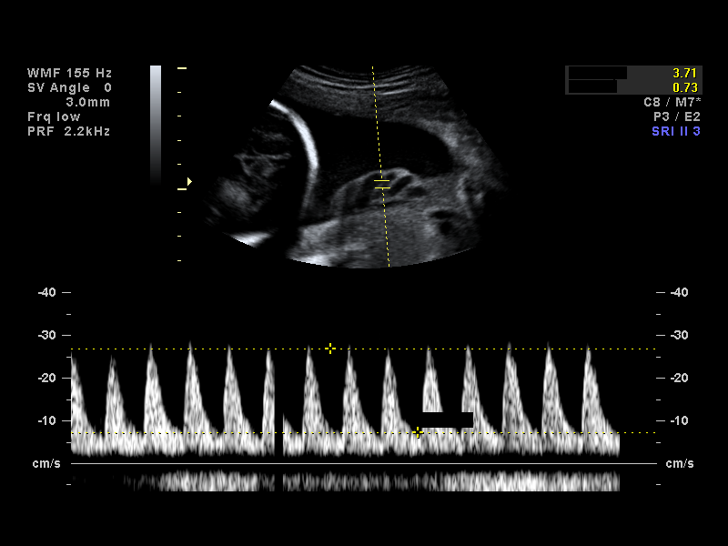

[14 of 18 positions shown; findings below may reference images not displayed]

IMPRESSION: [facility] has also been faxed to the ordering physician.

## 2007-12-09 IMAGING — US US OB UMBILICAL ART DOPPLER
1 series · 7 of 7 positions shown · non-contrast
Comparison: none

OBSTETRICAL ULTRASOUND:
 This ultrasound exam was performed in the [HOSPITAL] Ultrasound Department.  The OB US report was generated in the [facility] system, and faxed to the ordering physician.  This report is also available in [REDACTED] PACS.

[Series 1: us ua cord doppler · 7 of 7 slices shown]
[im 1/7]
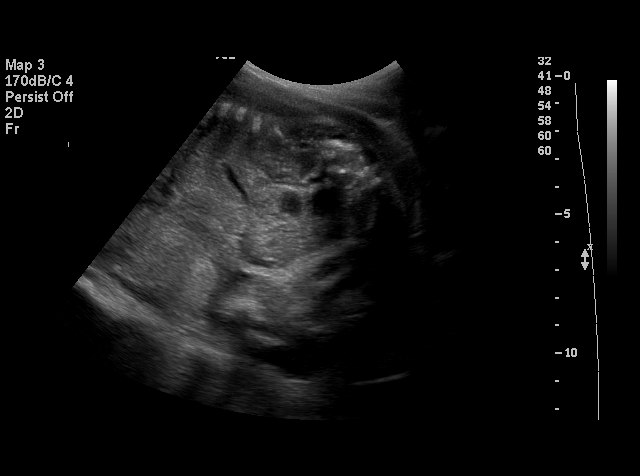
[im 2/7]
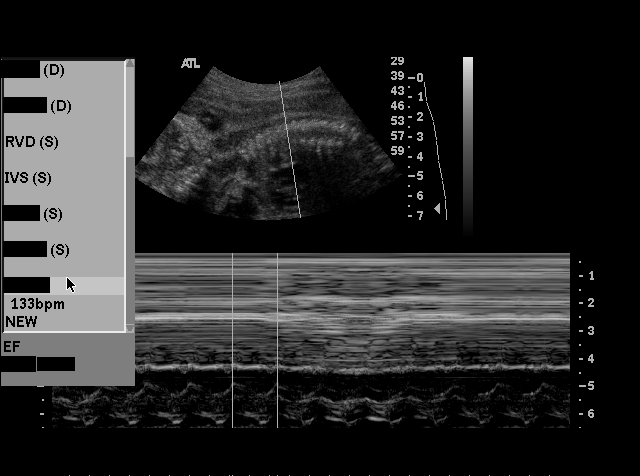
[im 3/7]
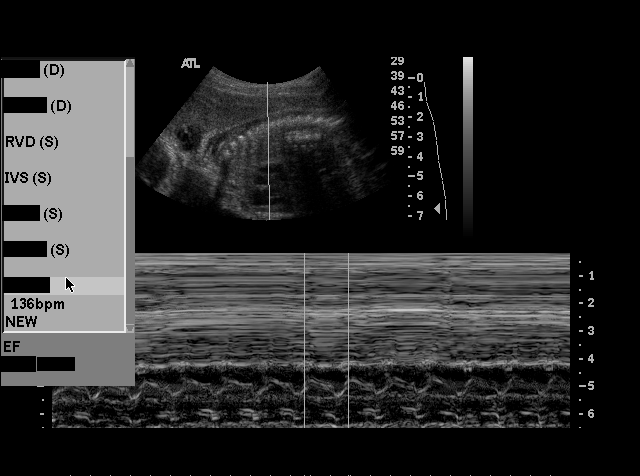
[im 4/7]
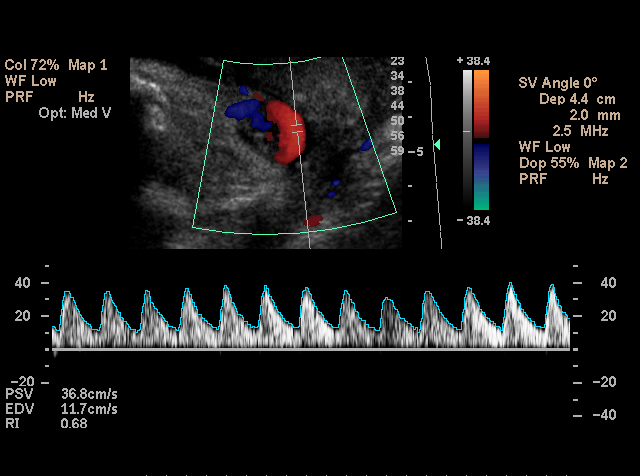
[im 5/7]
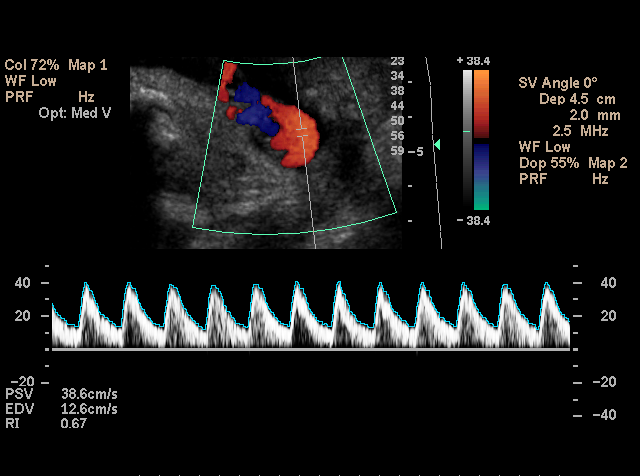
[im 6/7]
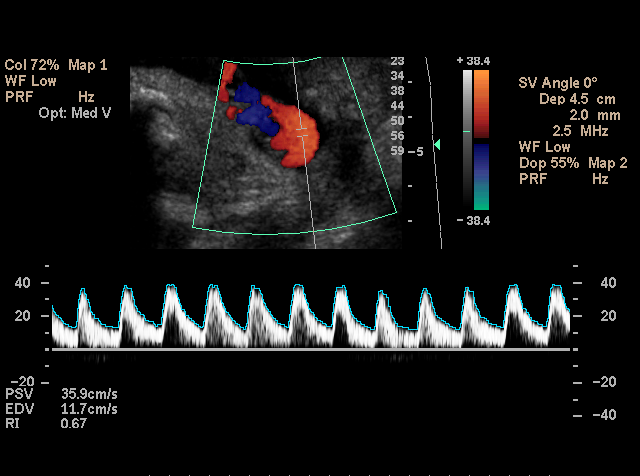
[im 7/7]
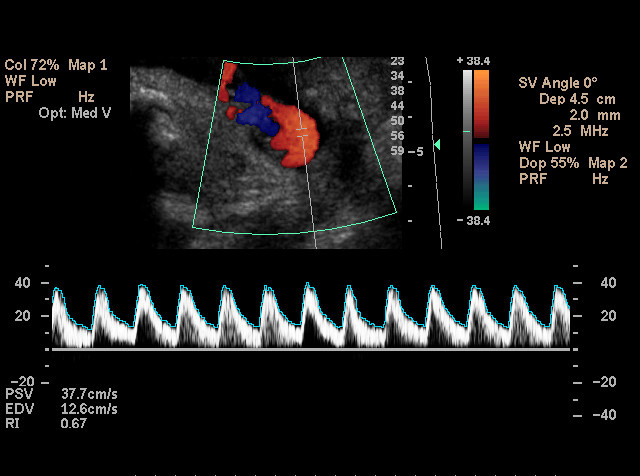

[7 of 7 positions shown; findings below may reference images not displayed]

IMPRESSION: See [facility] Obstetric US report.

## 2007-12-10 IMAGING — US US OB UMBILICAL ART DOPPLER
1 series · 14 of 18 positions shown · non-contrast
Comparison: none

OBSTETRICAL ULTRASOUND:
 This ultrasound exam was performed in the [HOSPITAL] Ultrasound Department.  The OB US report was generated in the [facility] system, and faxed to the ordering physician.  This report is also available in [REDACTED] PACS.

[Series 1: us ua cord doppler · 18 acquisitions, 14 frames shown]
[im 1/18]
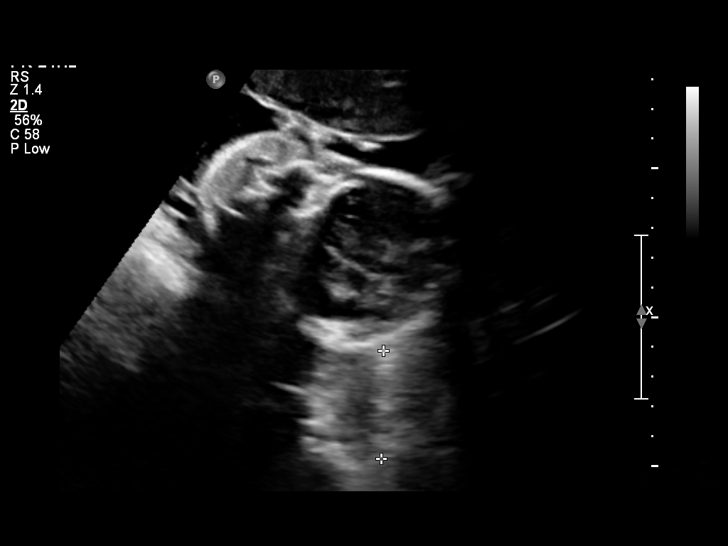
[im 2/18]
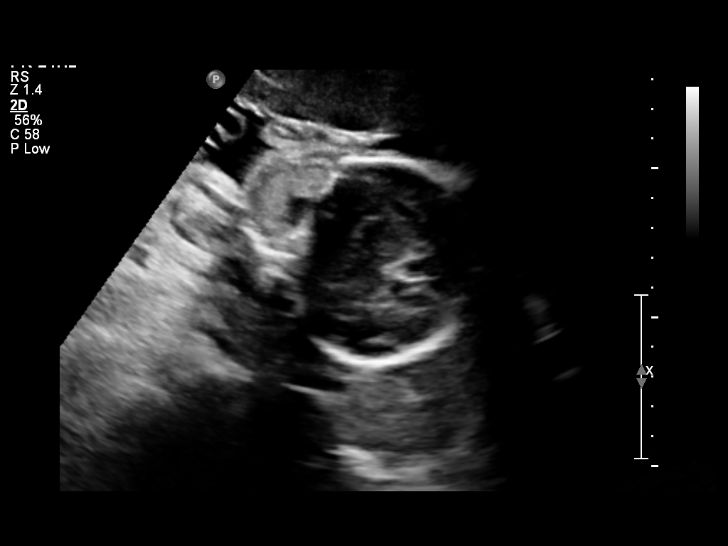
[im 4/18]
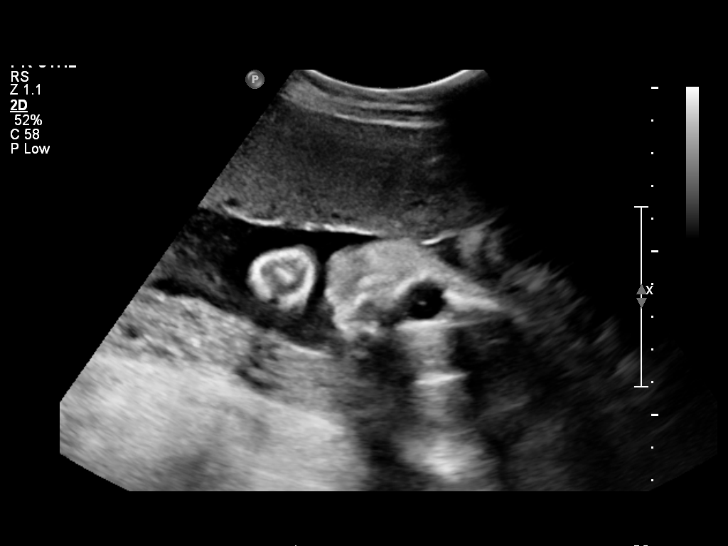
[im 5/18]
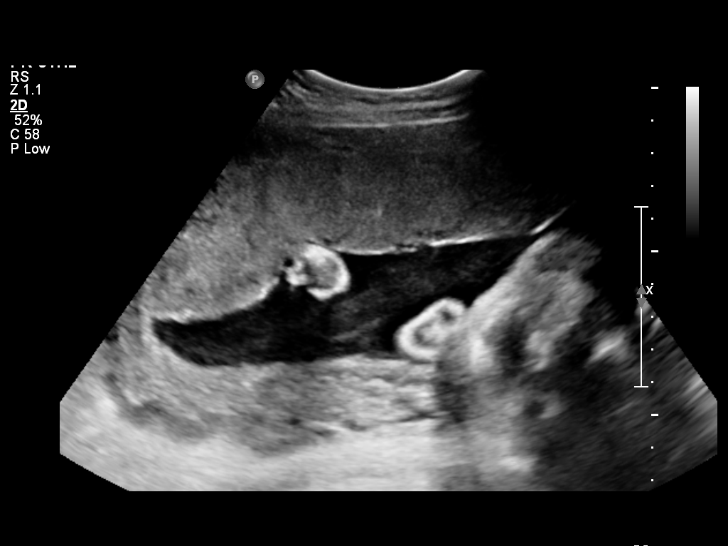
[im 6/18]
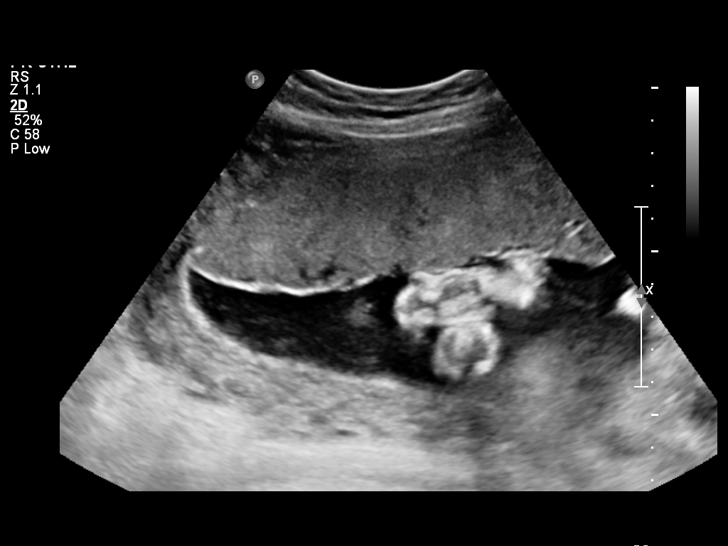
[im 8/18]
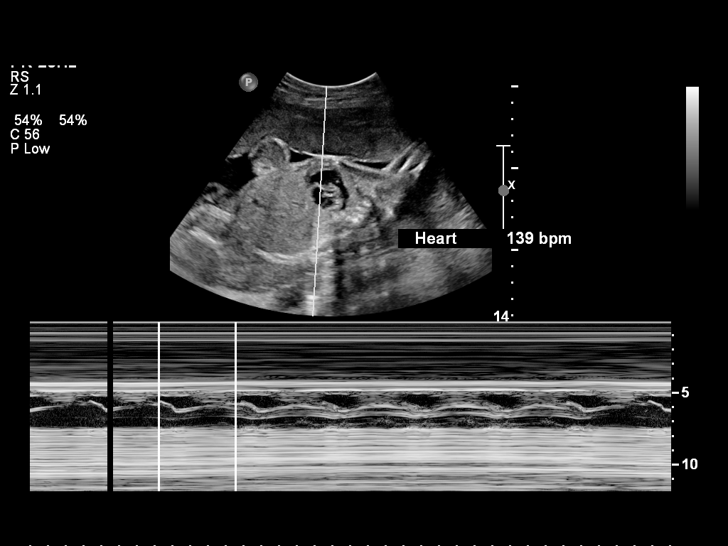
[im 9/18]
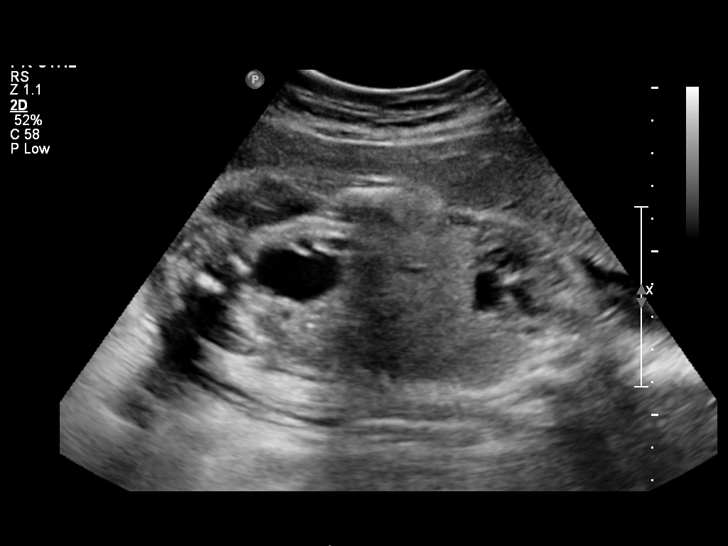
[im 10/18]
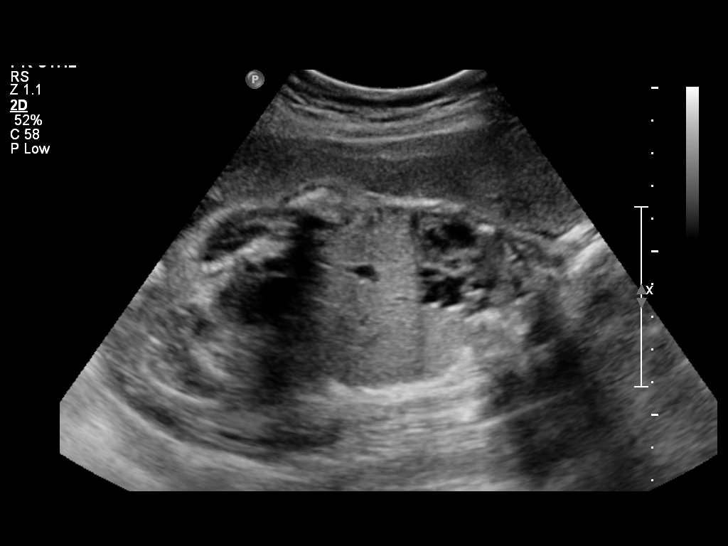
[im 11/18]
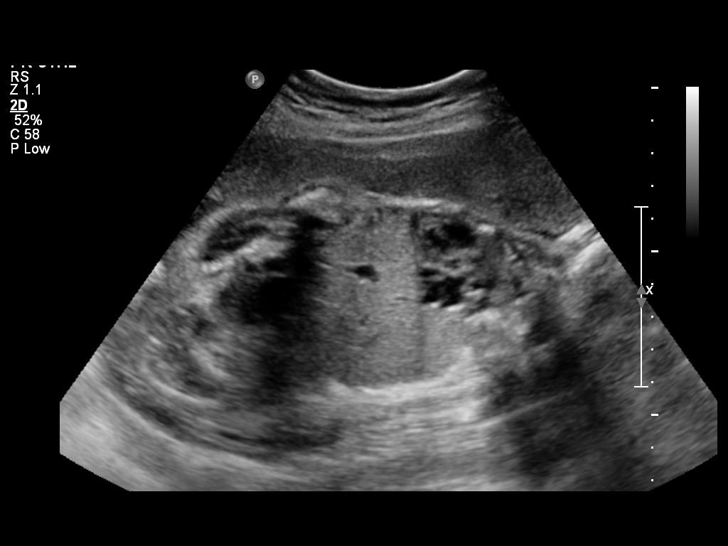
[im 13/18]
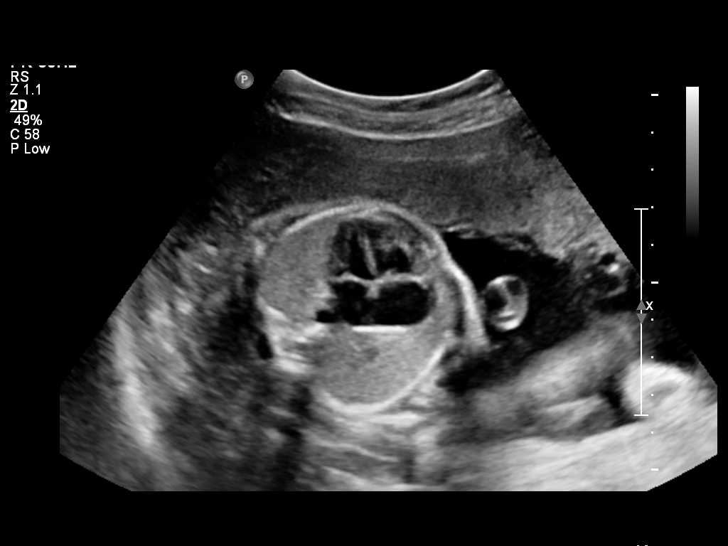
[im 14/18]
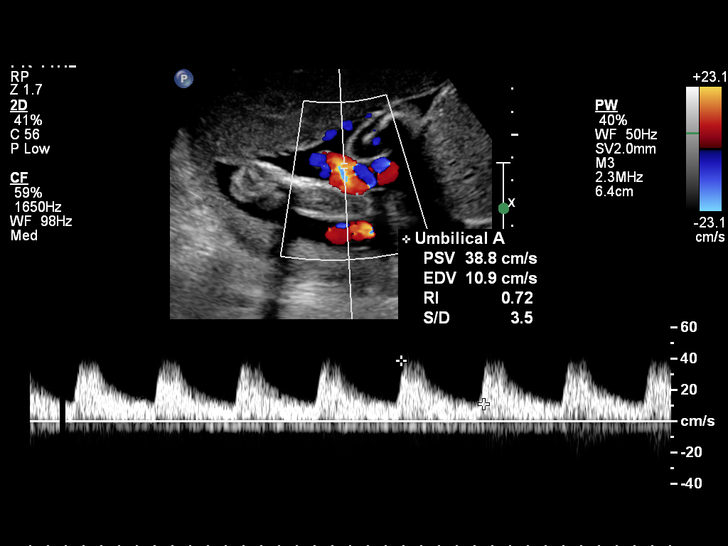
[im 15/18]
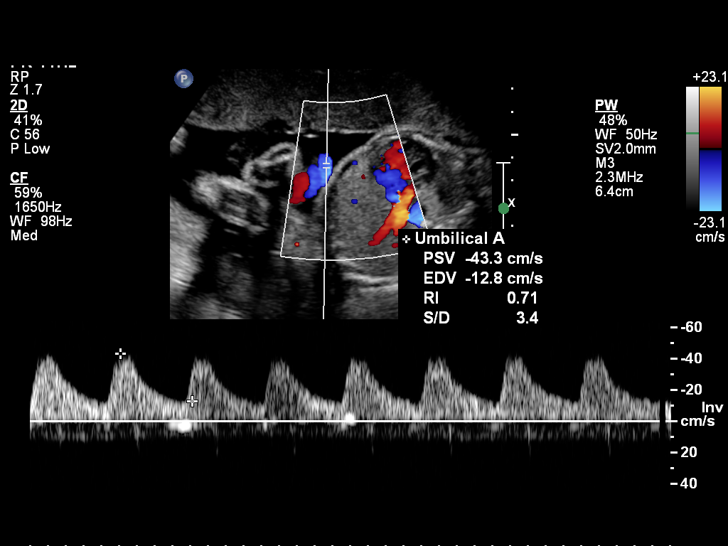
[im 17/18]
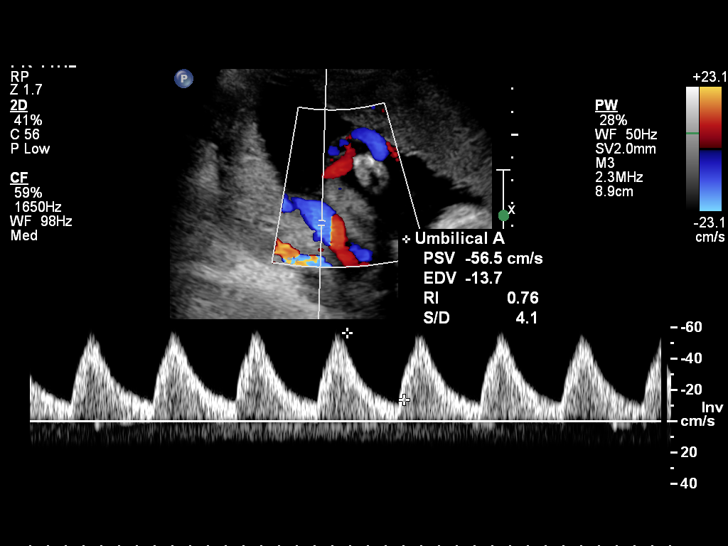
[im 18/18]
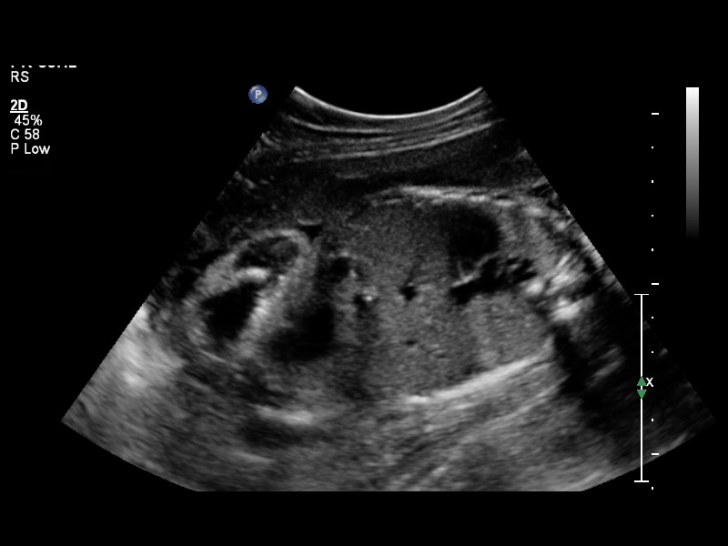

[14 of 18 positions shown; findings below may reference images not displayed]

IMPRESSION: See [facility] Obstetric US report.

## 2007-12-11 IMAGING — US US OB UMBILICAL ART DOPPLER
1 series · 11 of 11 positions shown · non-contrast
Comparison: none

OBSTETRICAL ULTRASOUND:
 This ultrasound exam was performed in the [HOSPITAL] Ultrasound Department.  The OB US report was generated in the [facility] system, and faxed to the ordering physician.  This report is also available in [REDACTED] PACS.

[Series 1: us fetal bpp w/o nonstress · non-contrast · 11 acquisitions, 11 frames shown]
[im 1/11]
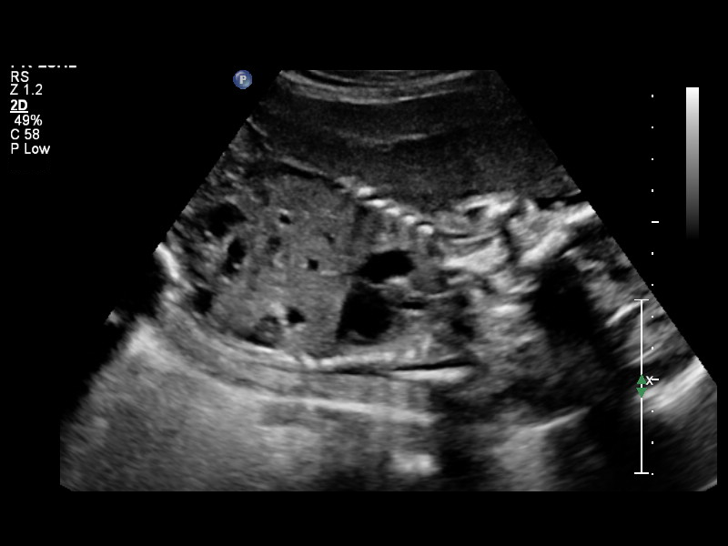
[im 2/11]
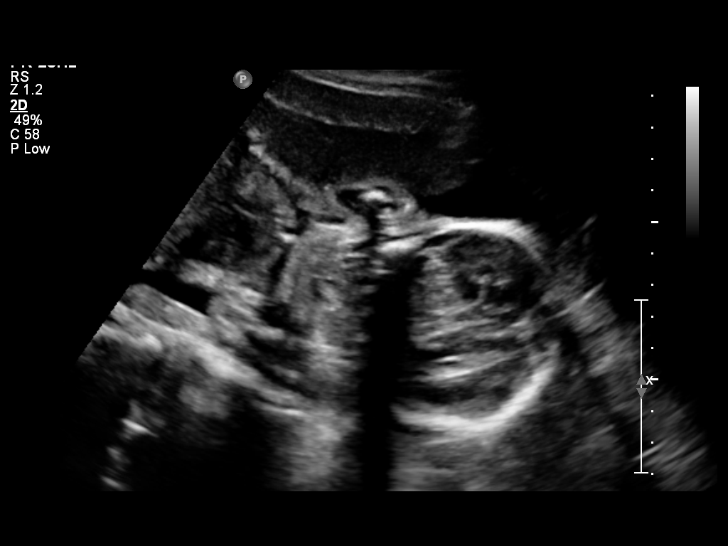
[im 3/11]
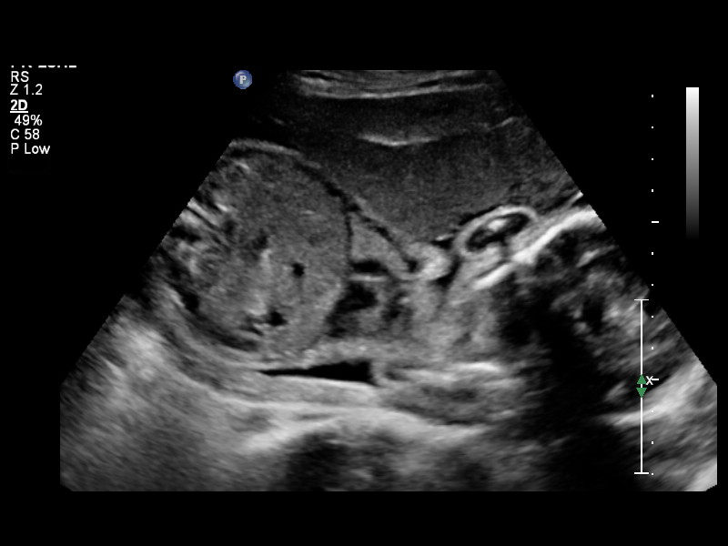
[im 4/11]
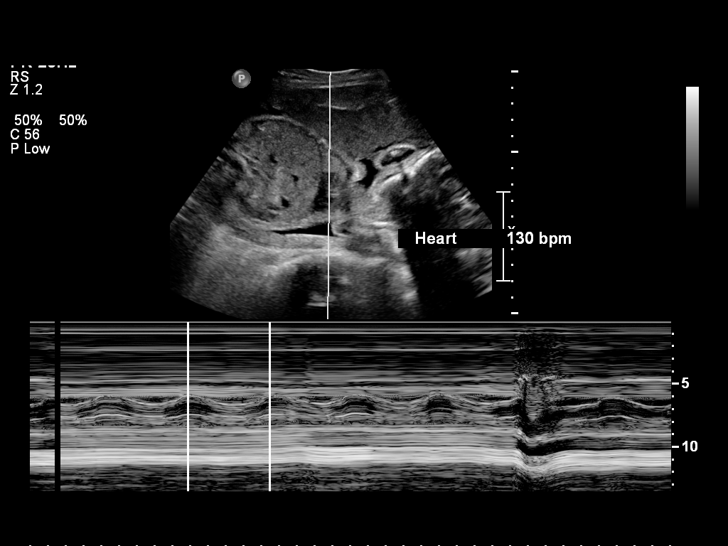
[im 5/11]
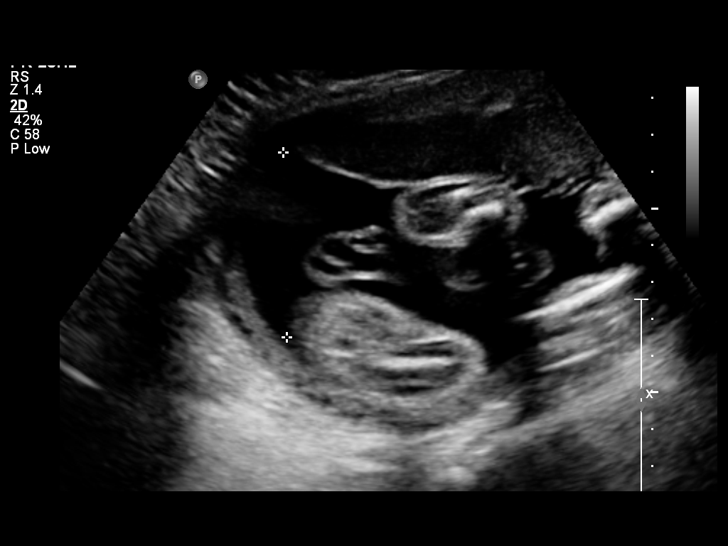
[im 6/11]
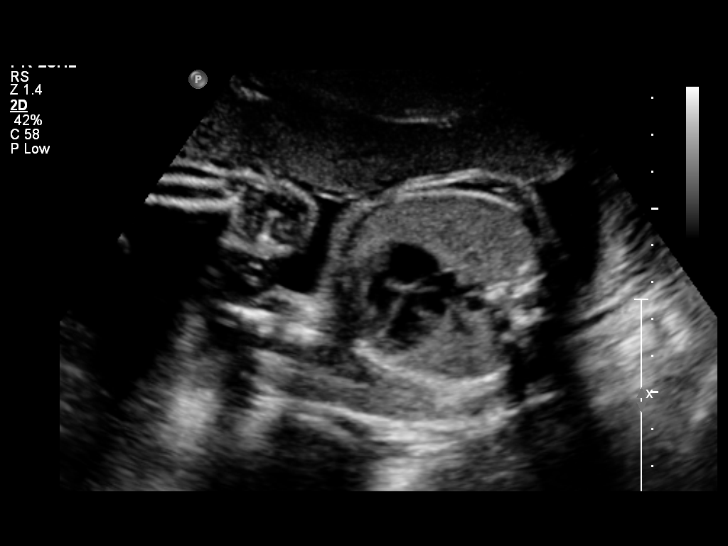
[im 7/11]
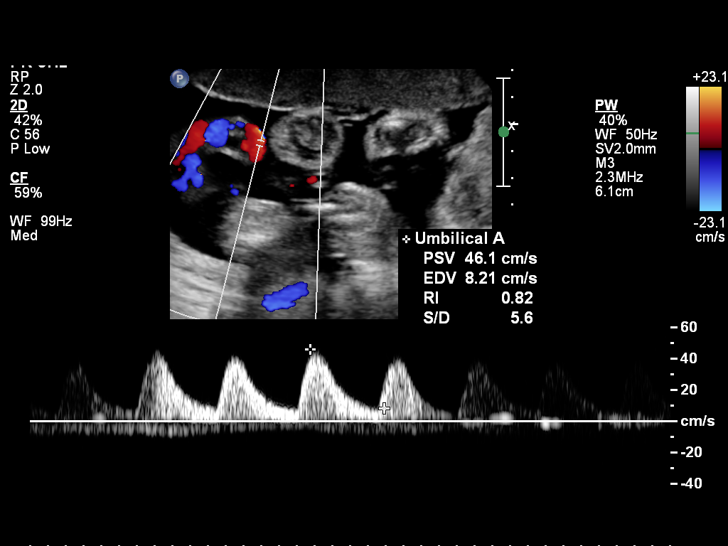
[im 8/11]
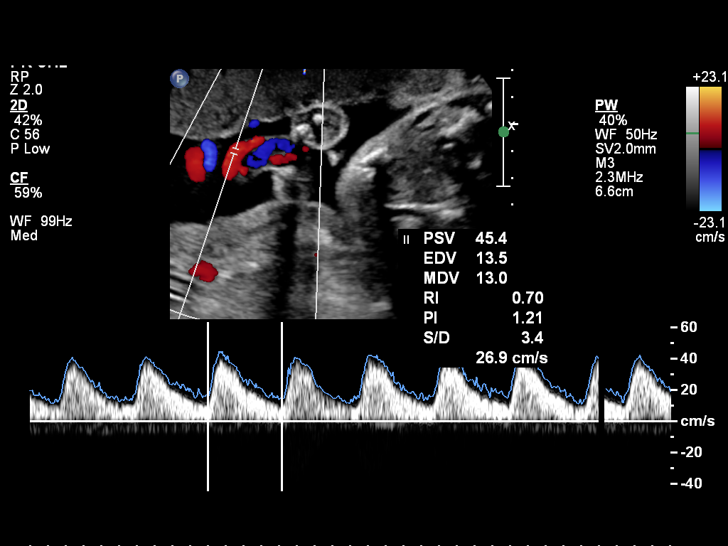
[im 9/11]
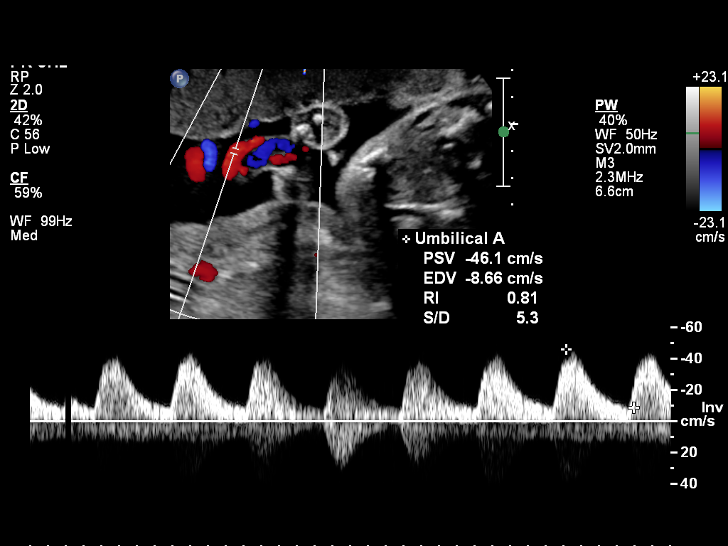
[im 10/11]
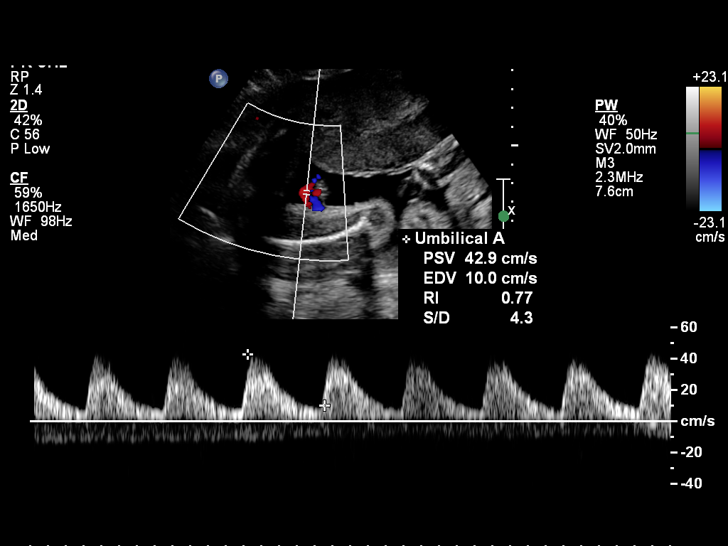
[im 11/11]
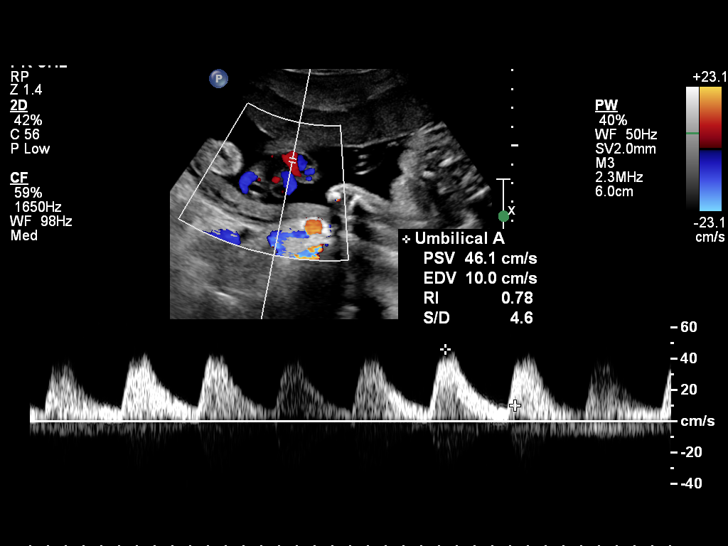

[11 of 11 positions shown; findings below may reference images not displayed]

IMPRESSION: See [facility] Obstetric US report.

## 2007-12-12 ENCOUNTER — Encounter: Payer: Self-pay | Admitting: *Deleted

## 2007-12-12 IMAGING — US US UA DOPPLER RE-EVAL
1 series · 14 of 27 positions shown · non-contrast
Comparison: none

OBSTETRICAL ULTRASOUND:
 This ultrasound was performed in The [HOSPITAL], and the [facility] report will be stored to [REDACTED] PACS.

[Series 1: us ua doppler re-eval · 14 of 27 slices shown]
[im 1/27]
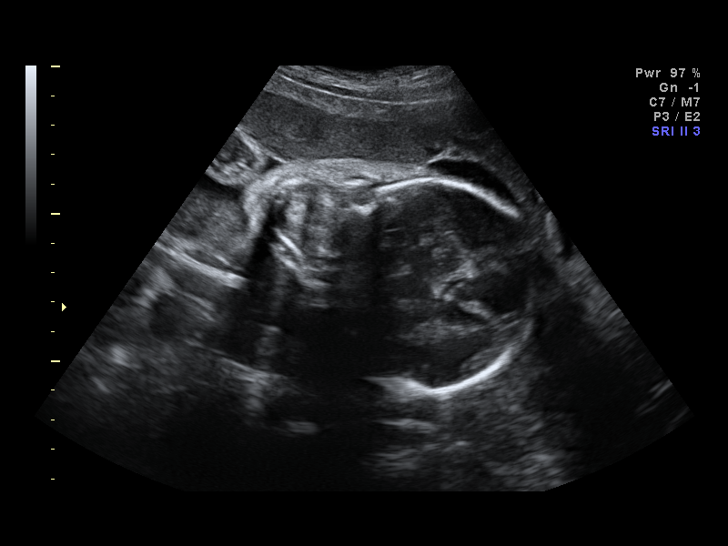
[im 3/27]
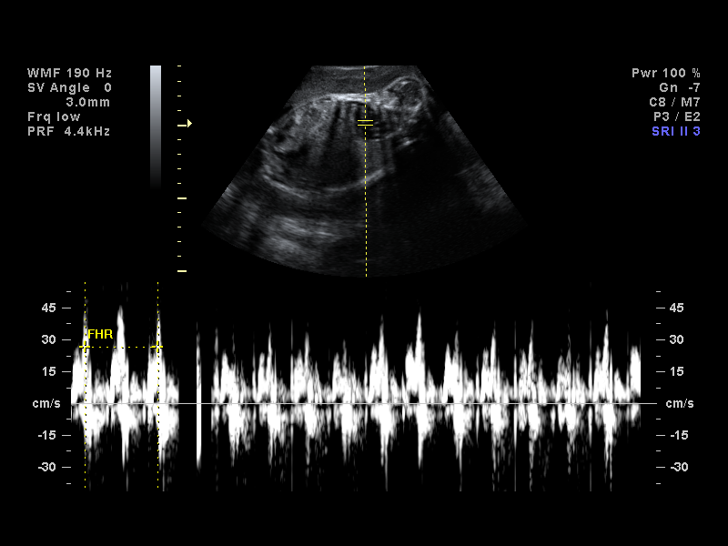
[im 5/27]
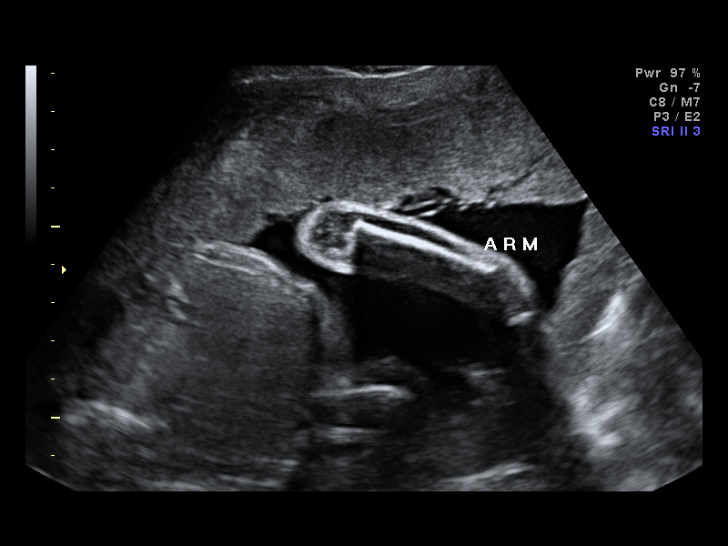
[im 7/27]
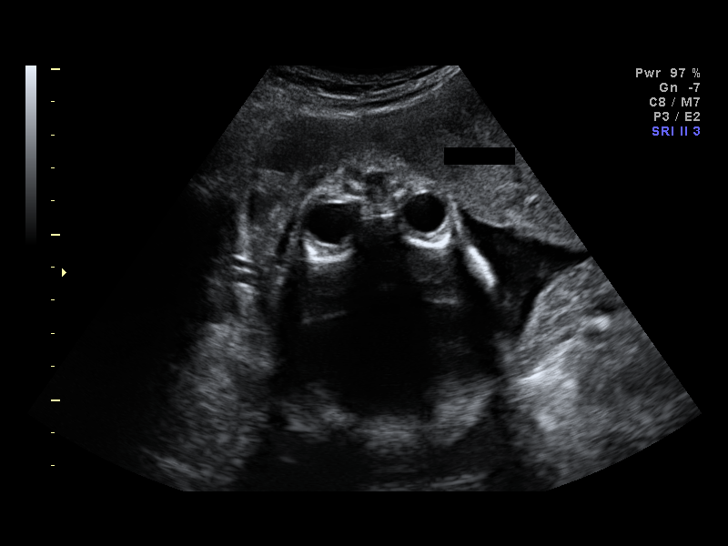
[im 9/27]
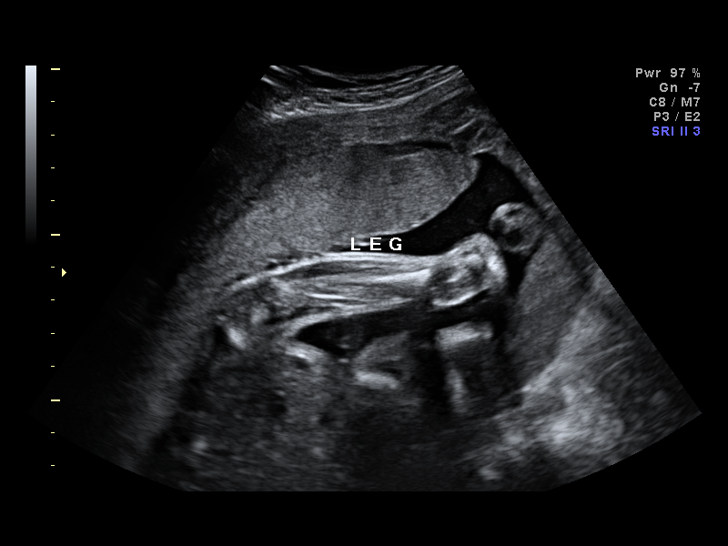
[im 11/27]
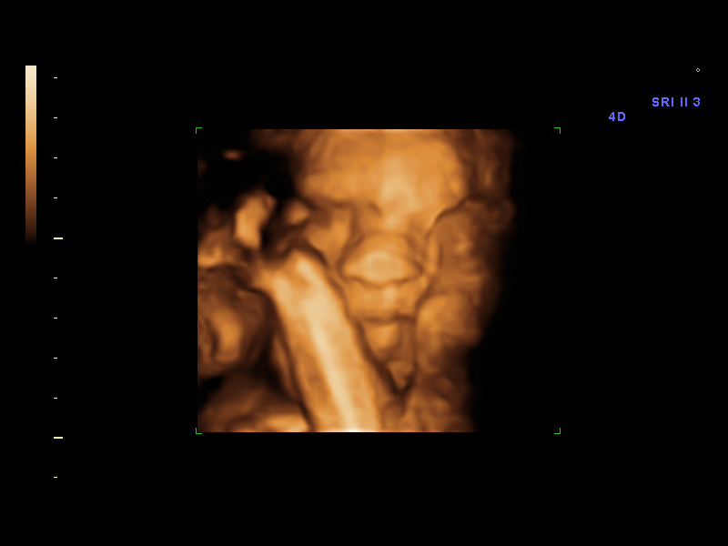
[im 13/27]
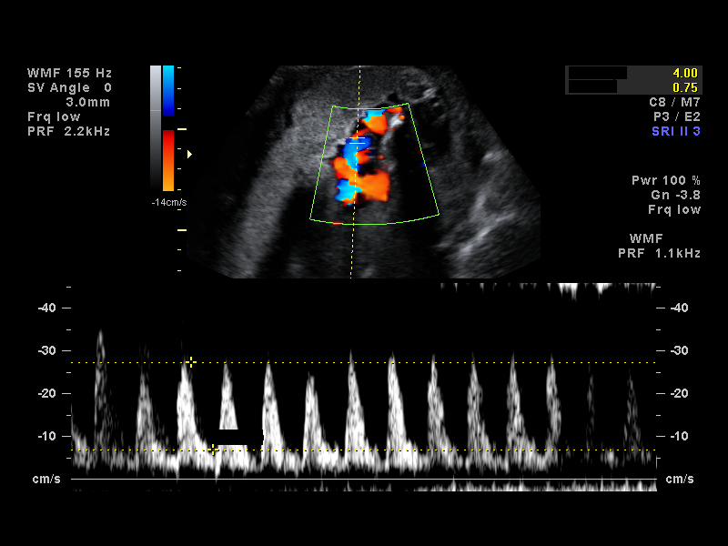
[im 15/27]
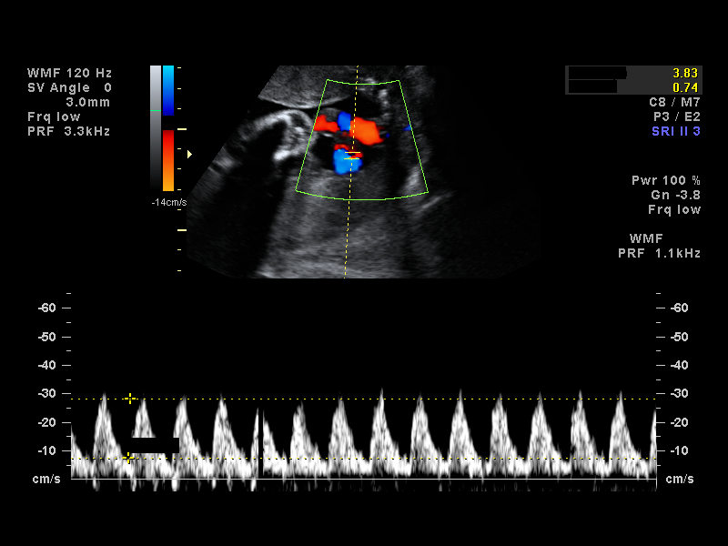
[im 17/27]
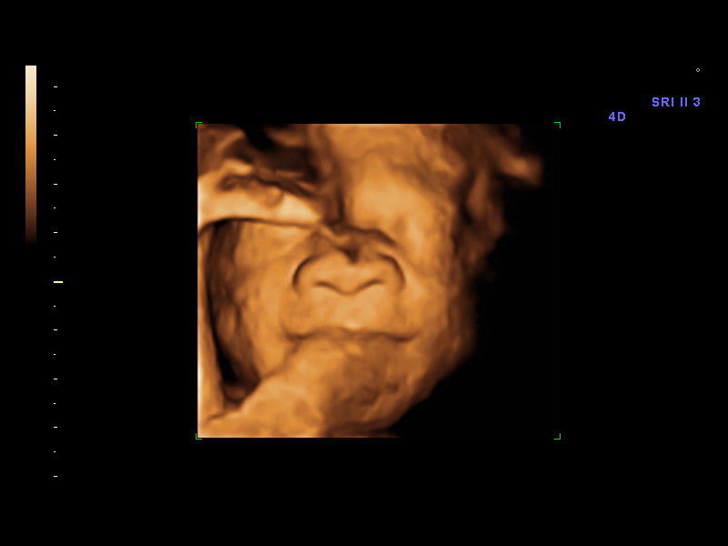
[im 19/27]
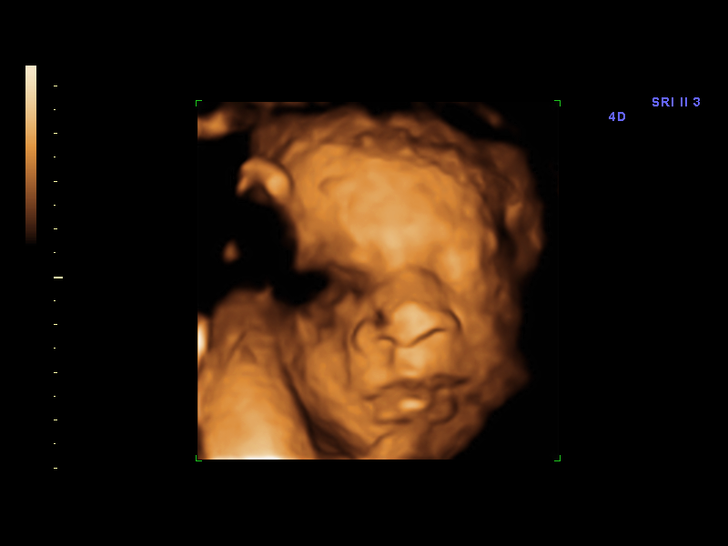
[im 21/27]
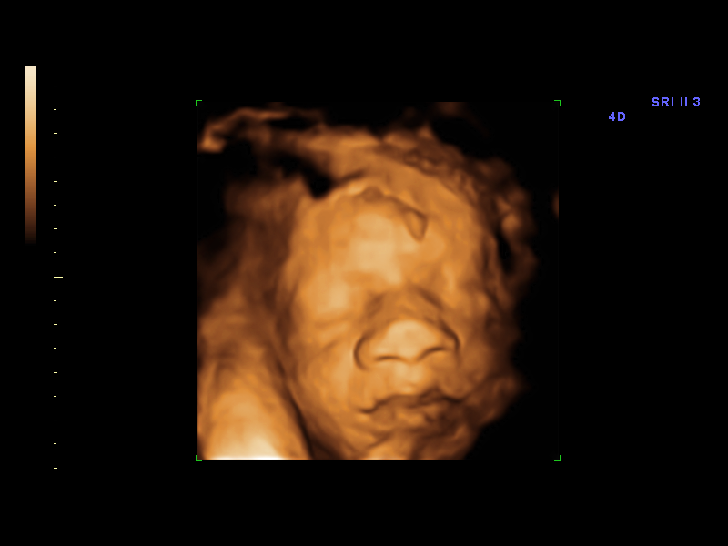
[im 23/27]
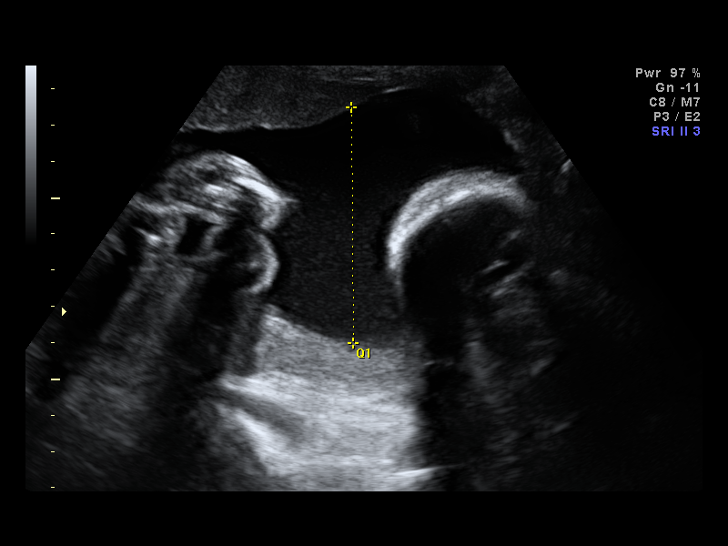
[im 25/27]
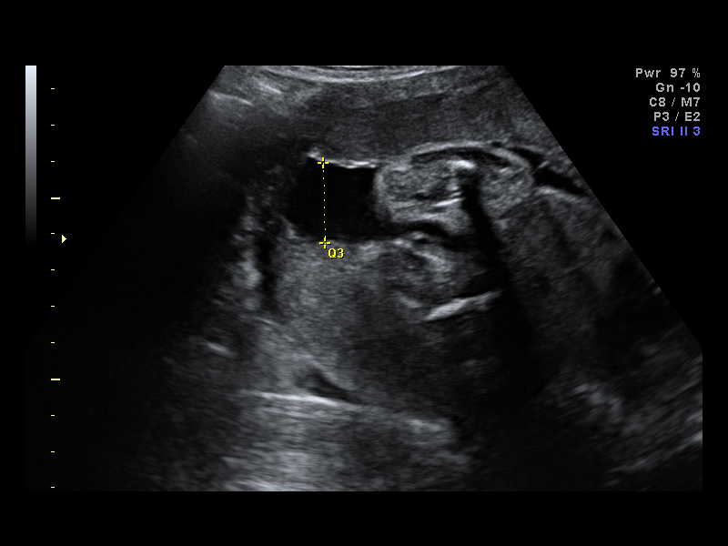
[im 27/27]
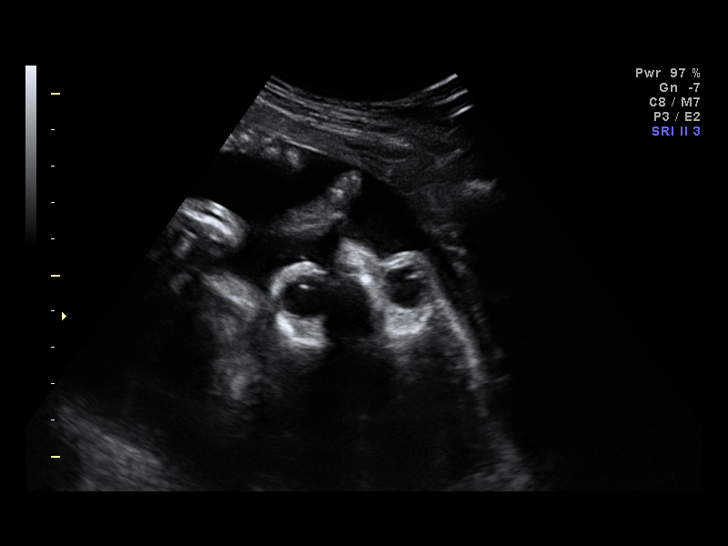

[14 of 27 positions shown; findings below may reference images not displayed]

IMPRESSION: [facility] has also been faxed to the ordering physician.

## 2007-12-13 ENCOUNTER — Encounter: Payer: Self-pay | Admitting: *Deleted

## 2007-12-13 IMAGING — US US UA DOPPLER RE-EVAL
1 series · 12 of 12 positions shown · non-contrast
Comparison: none

OBSTETRICAL ULTRASOUND:
 This ultrasound was performed in The [HOSPITAL], and the [facility] report will be stored to [REDACTED] PACS.

[Series 1: us ua doppler re-eval · 12 of 12 slices shown]
[im 1/12]
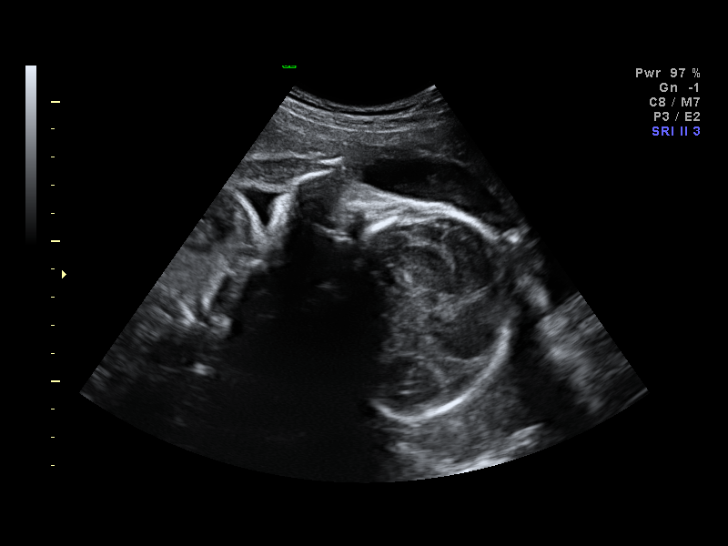
[im 2/12]
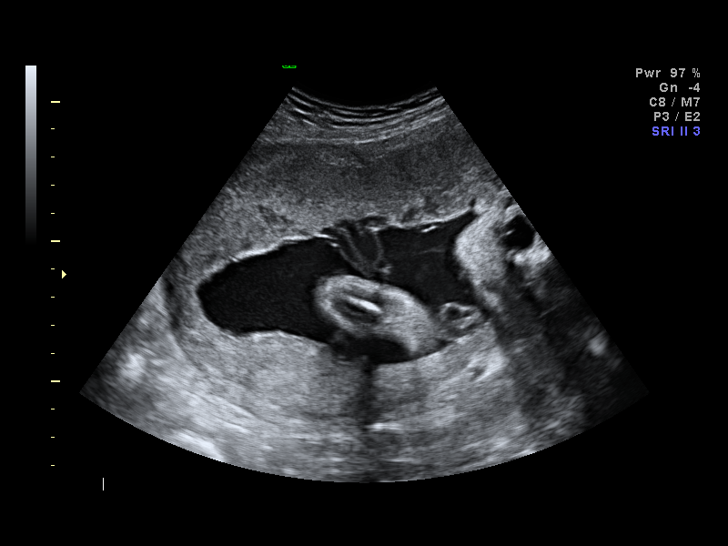
[im 3/12]
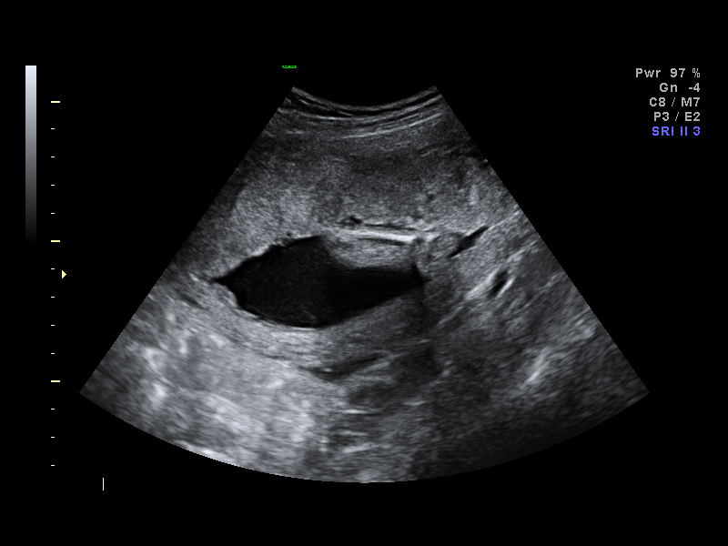
[im 4/12]
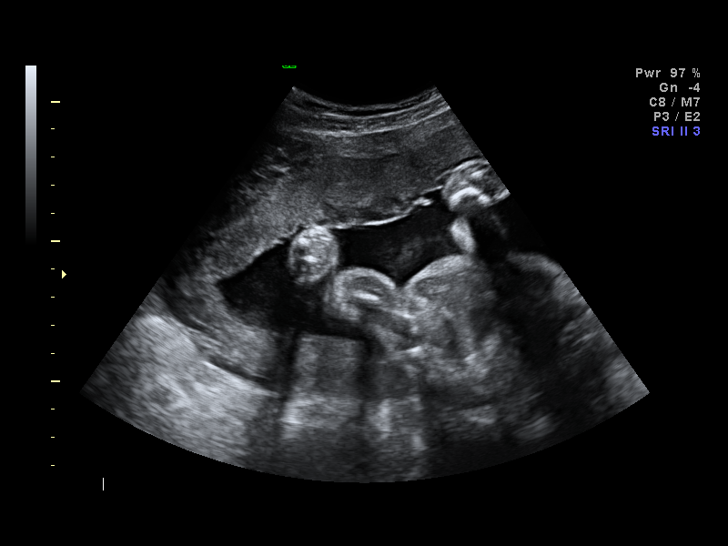
[im 5/12]
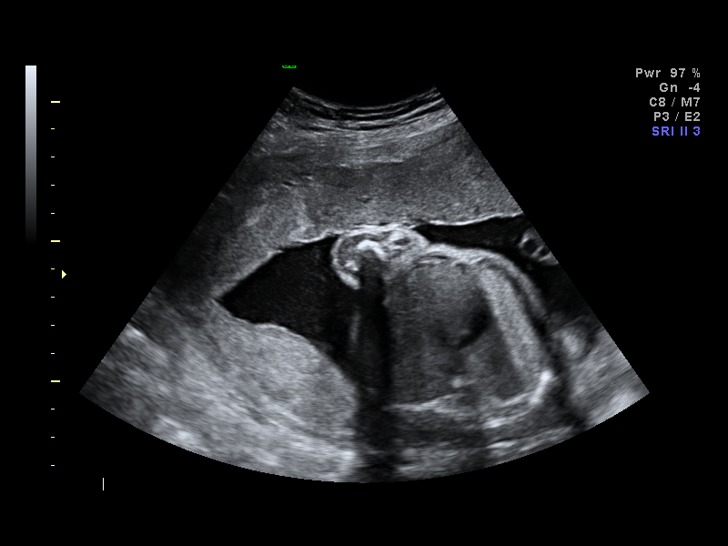
[im 6/12]
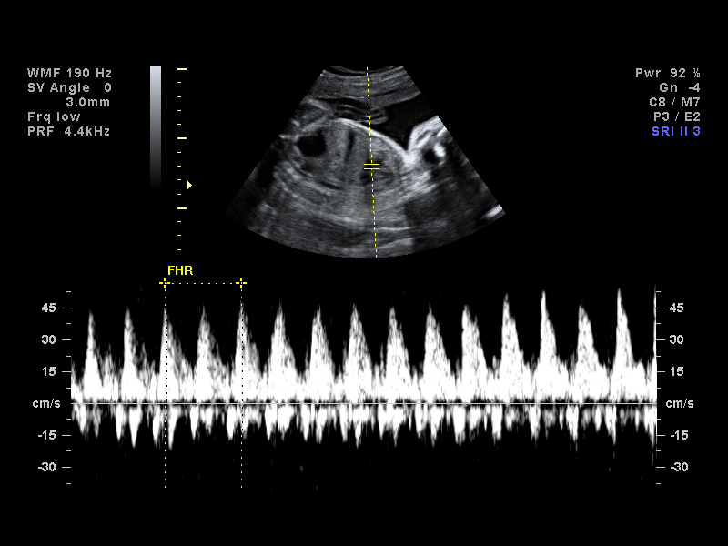
[im 7/12]
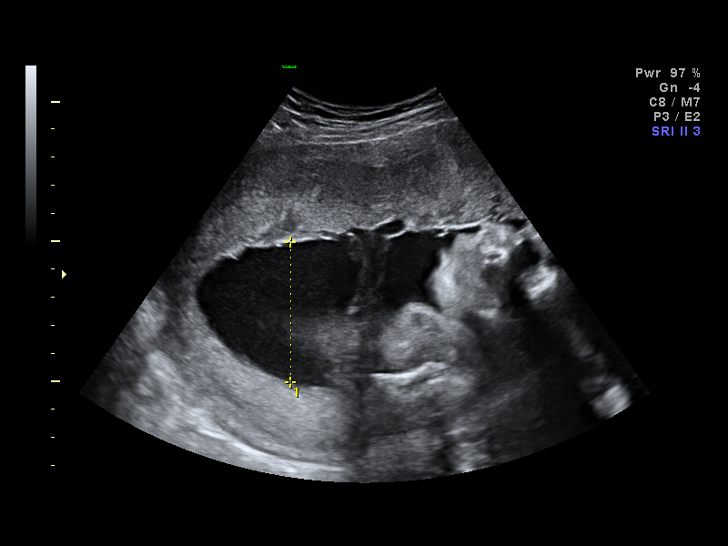
[im 8/12]
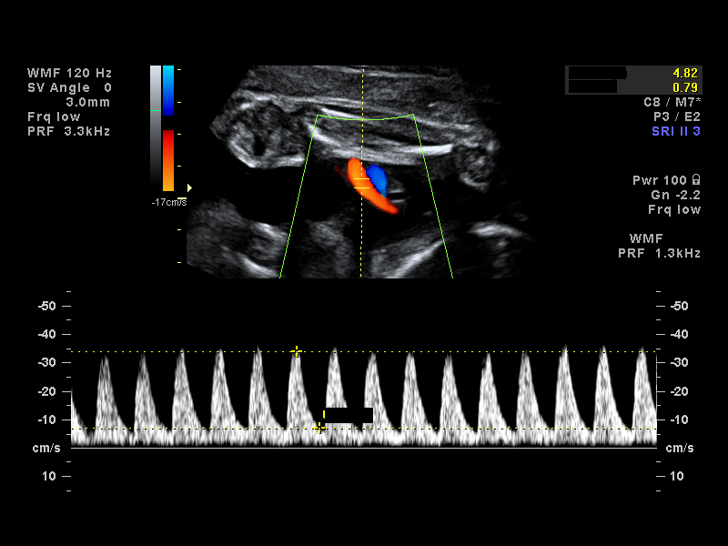
[im 9/12]
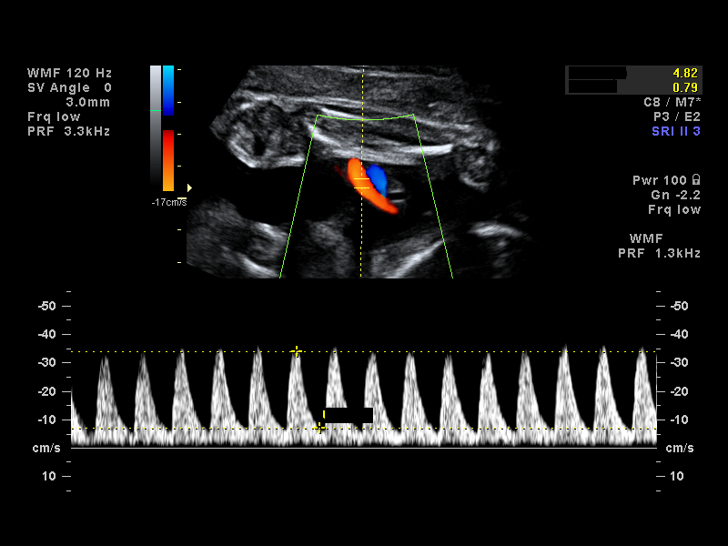
[im 10/12]
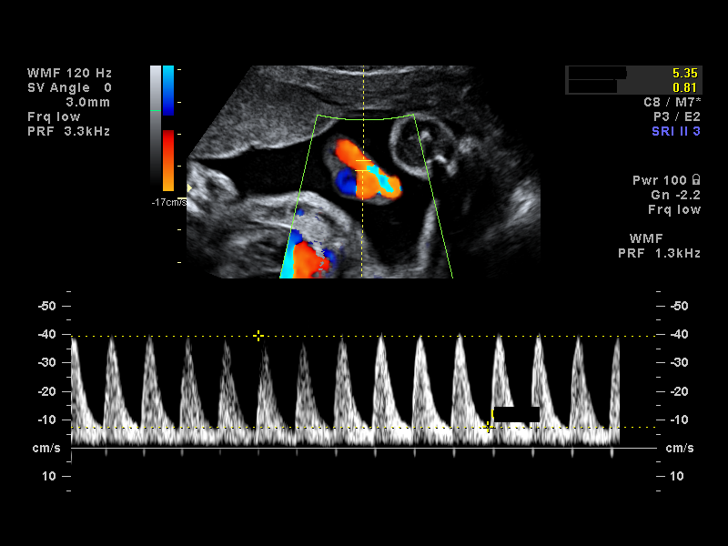
[im 11/12]
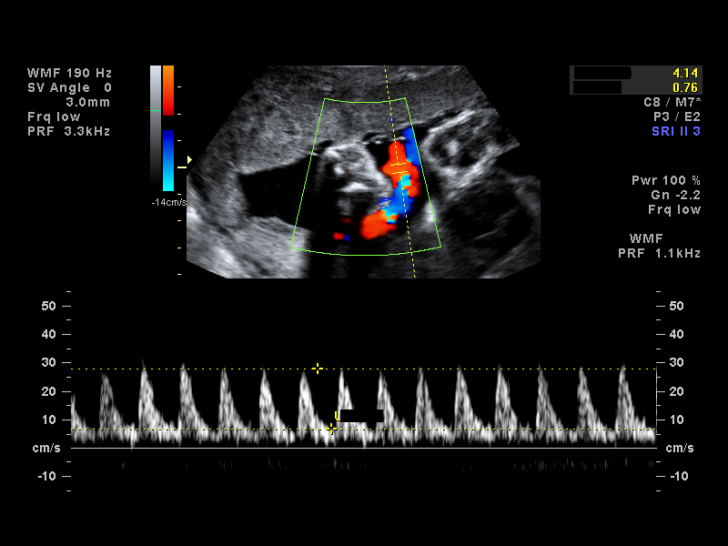
[im 12/12]
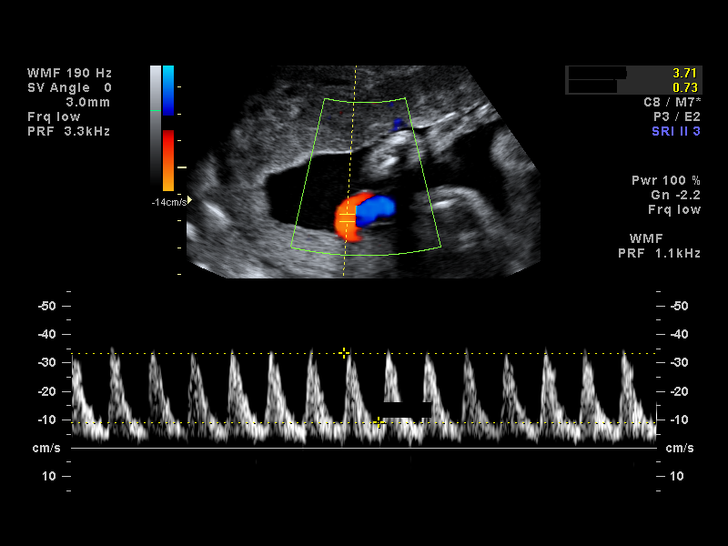

[12 of 12 positions shown; findings below may reference images not displayed]

IMPRESSION: [facility] has also been faxed to the ordering physician.

## 2007-12-14 ENCOUNTER — Encounter: Payer: Self-pay | Admitting: *Deleted

## 2007-12-14 IMAGING — US US UA DOPPLER RE-EVAL
1 series · 14 of 28 positions shown · non-contrast
Comparison: none

OBSTETRICAL ULTRASOUND:
 This ultrasound was performed in The [HOSPITAL], and the [facility] report will be stored to [REDACTED] PACS.

[Series 1: us ua doppler re-eval · 14 of 32 slices shown]
[im 2/32]
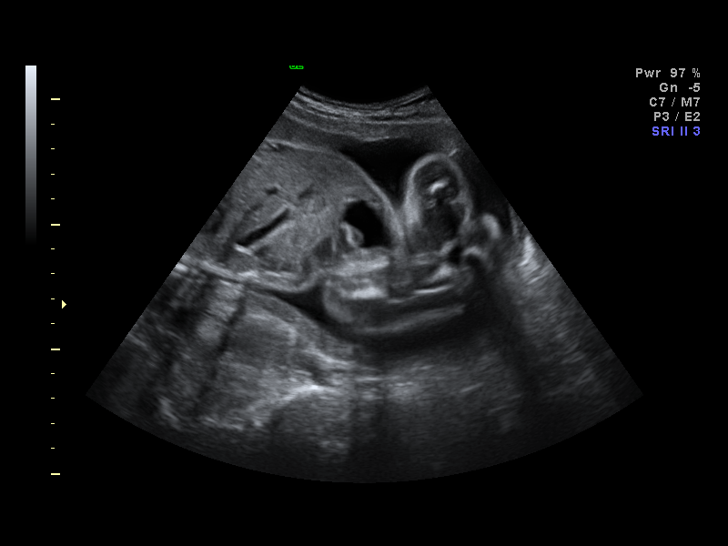
[im 4/32]
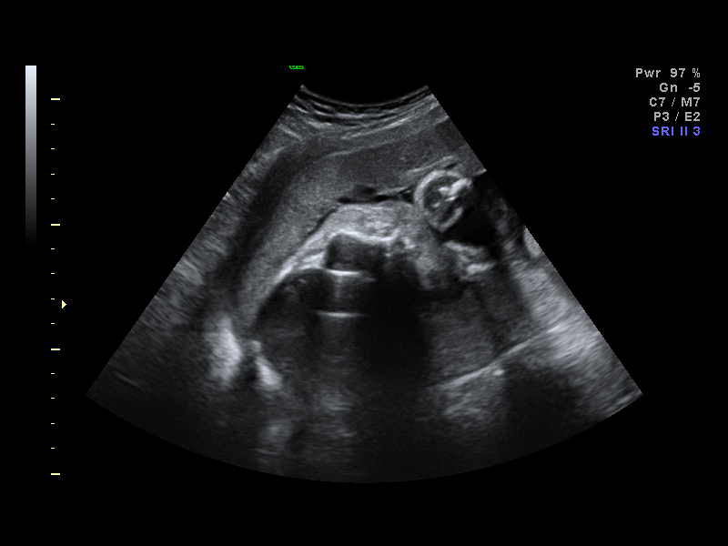
[im 6/32]
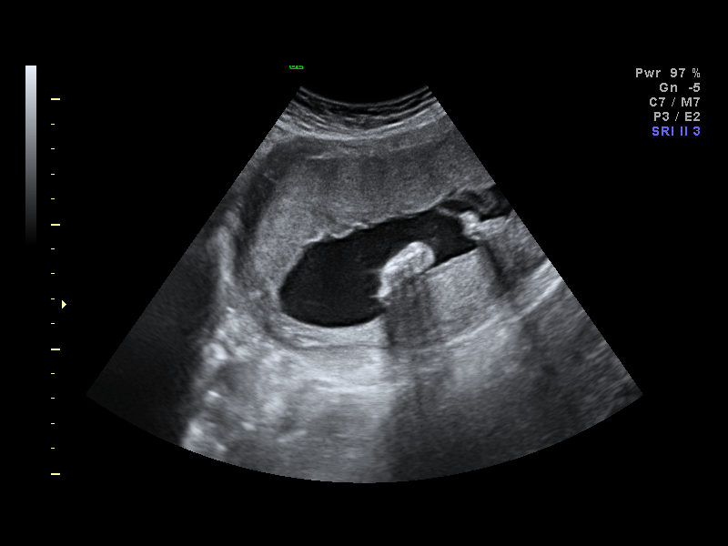
[im 9/32]
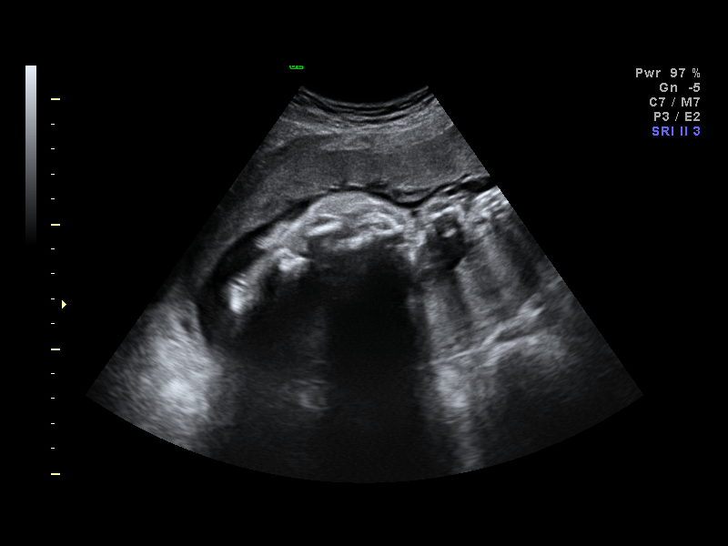
[im 11/32]
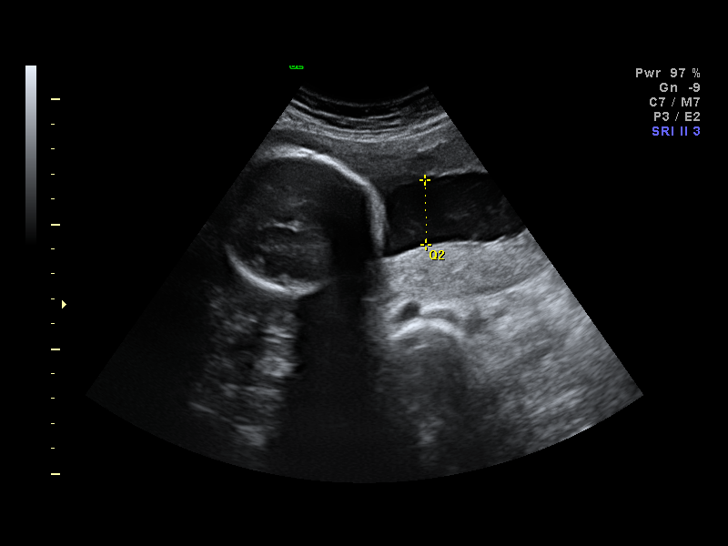
[im 13/32]
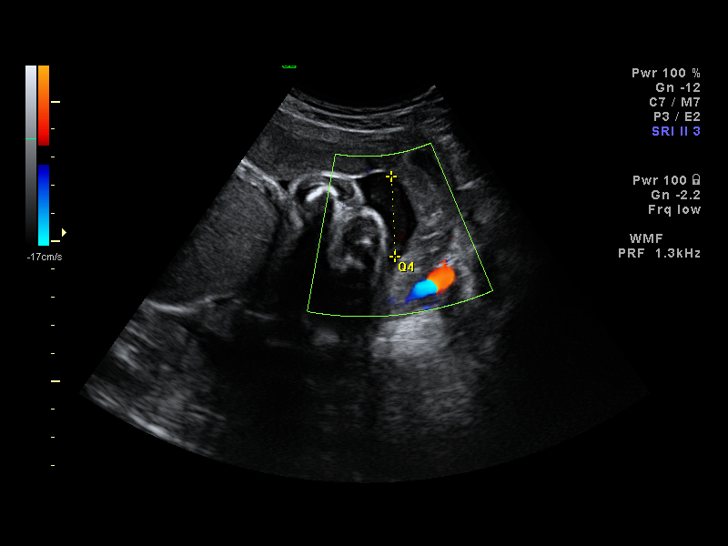
[im 15/32]
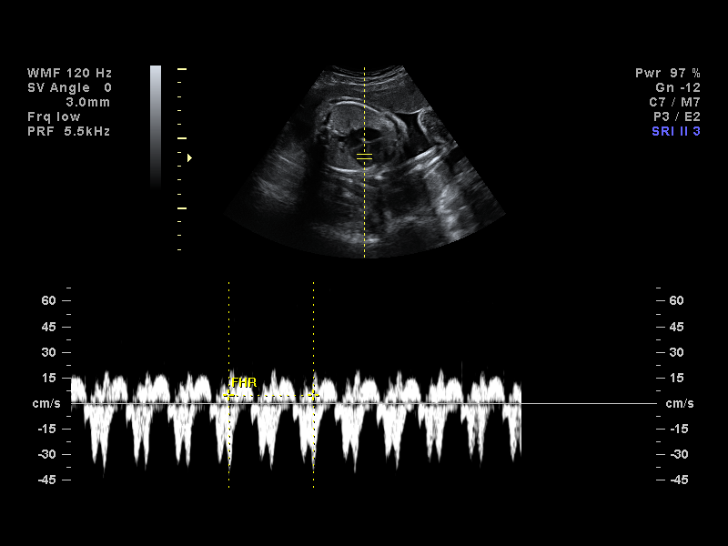
[im 18/32]
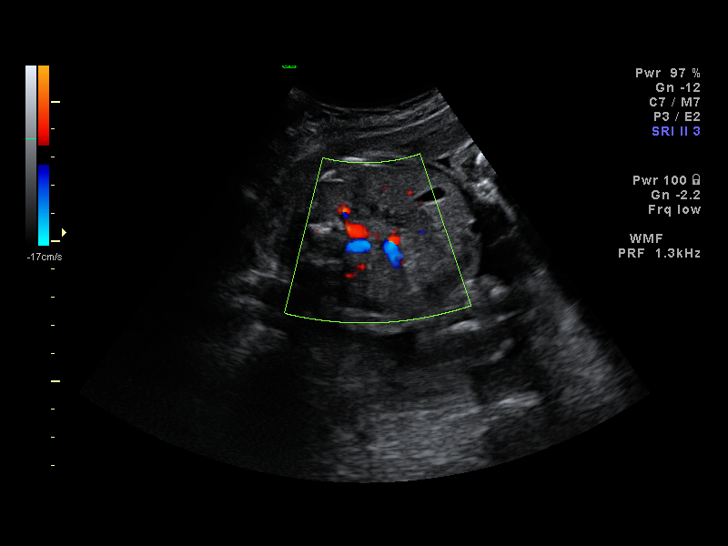
[im 20/32]
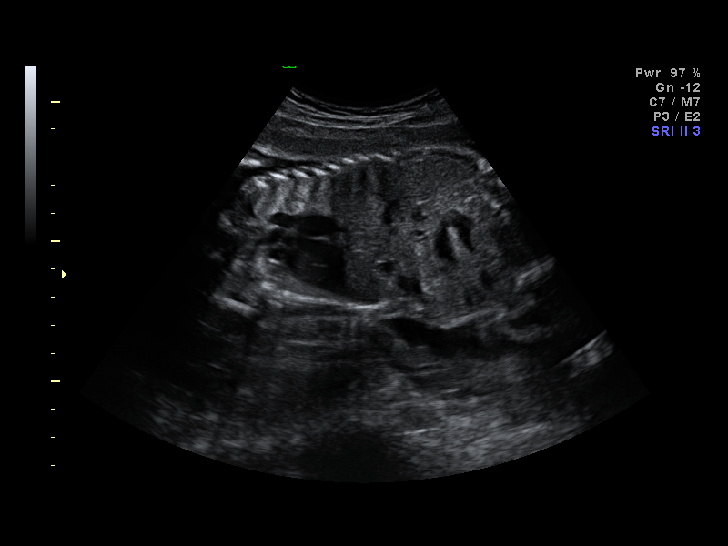
[im 22/32]
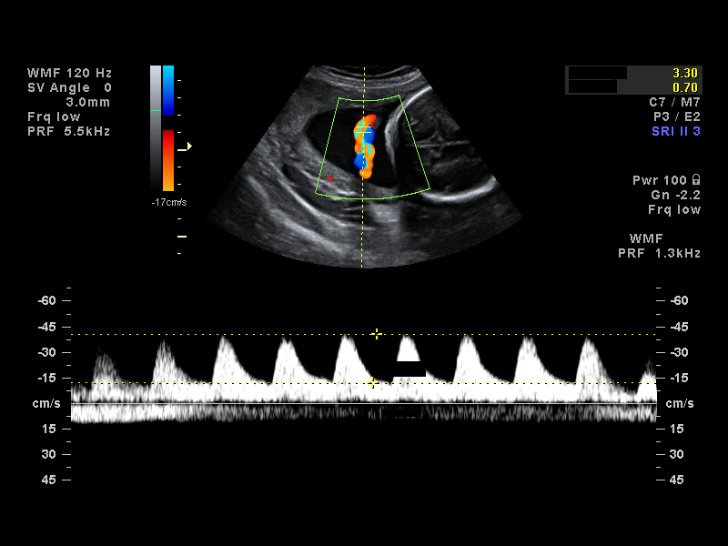
[im 25/32]
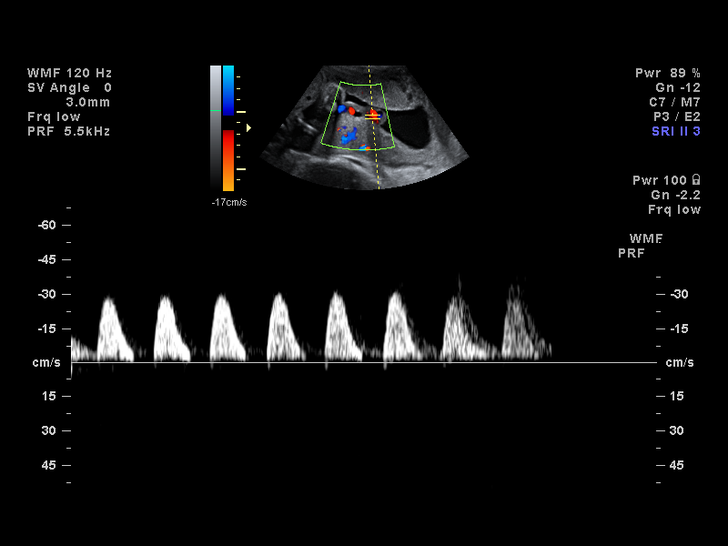
[im 27/32]
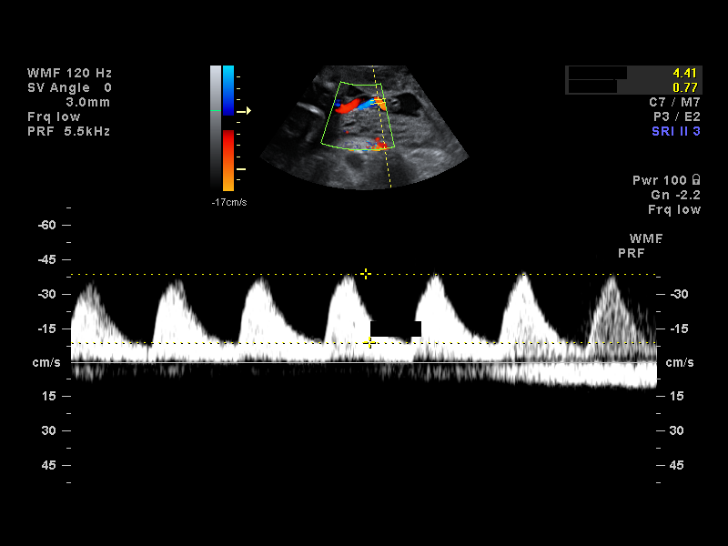
[im 29/32]
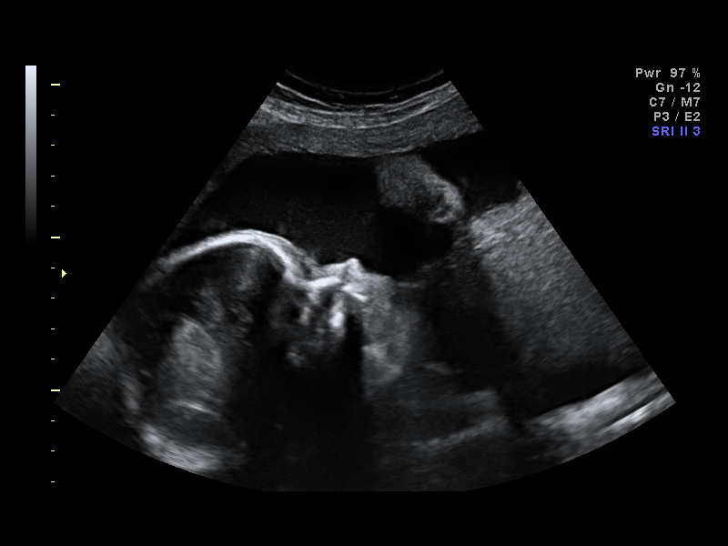
[im 32/32]
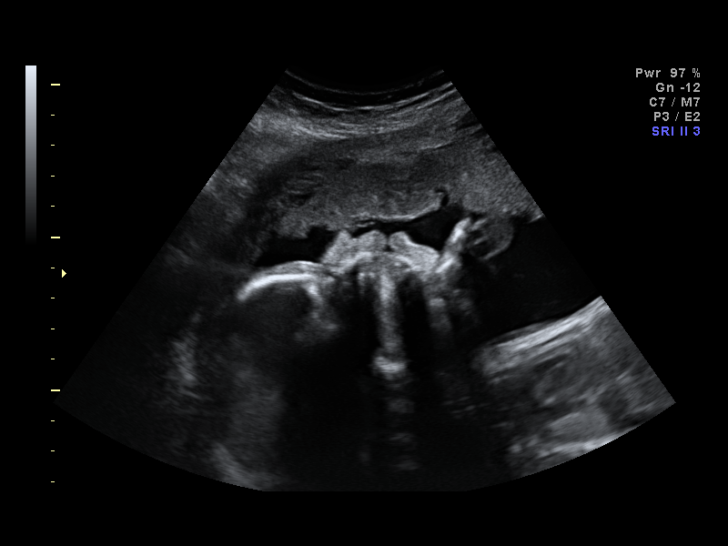

[14 of 28 positions shown; findings below may reference images not displayed]

IMPRESSION: [facility] has also been faxed to the ordering physician.

## 2007-12-15 ENCOUNTER — Encounter: Payer: Self-pay | Admitting: *Deleted

## 2007-12-15 IMAGING — US US UA DOPPLER RE-EVAL
1 series · 17 of 17 positions shown · non-contrast
Comparison: none

OBSTETRICAL ULTRASOUND:
 This ultrasound was performed in The [HOSPITAL], and the [facility] report will be stored to [REDACTED] PACS.

[Series 1: us ua doppler re-eval · 17 of 17 slices shown]
[im 1/17]
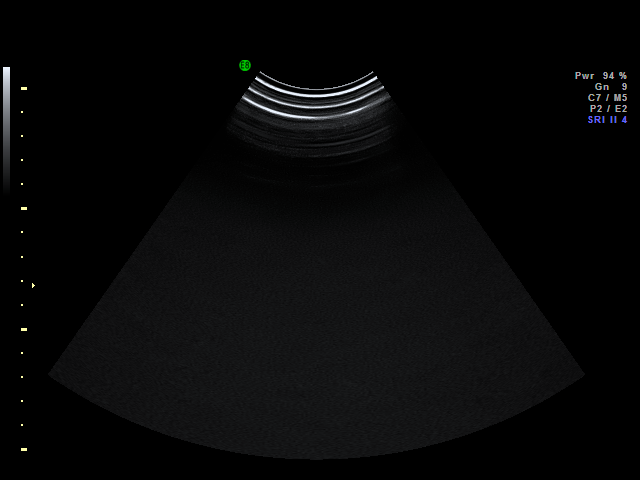
[im 2/17]
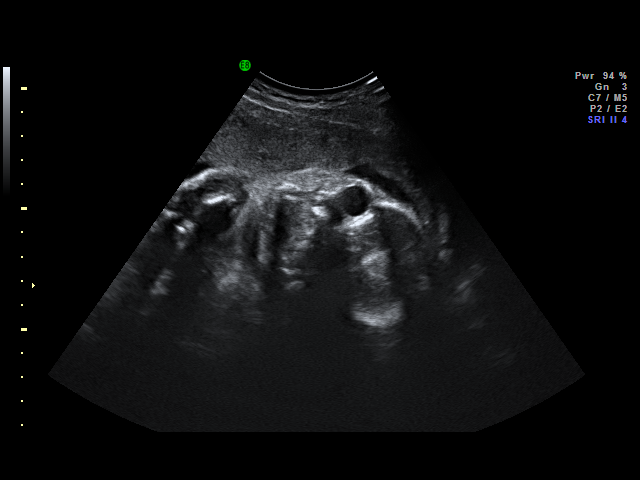
[im 3/17]
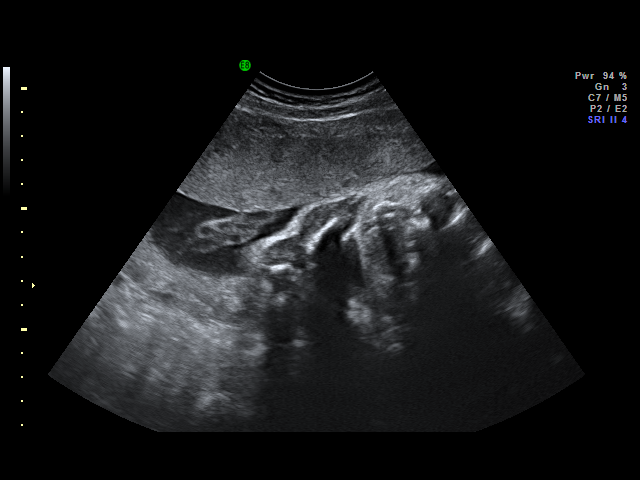
[im 4/17]
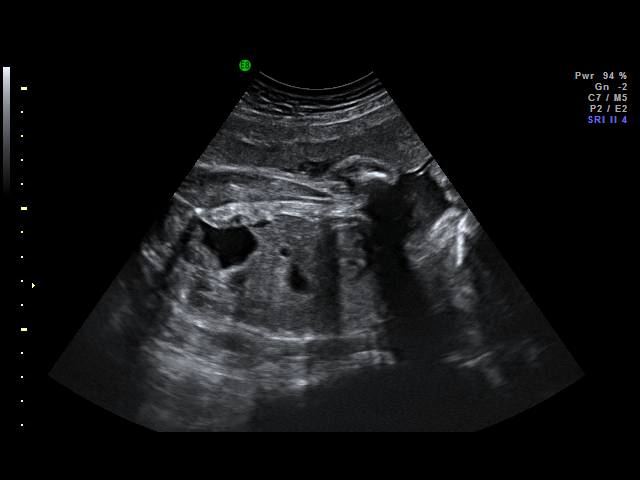
[im 5/17]
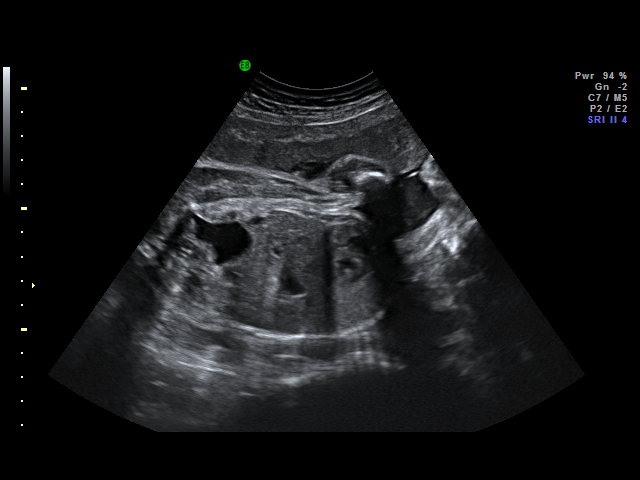
[im 6/17]
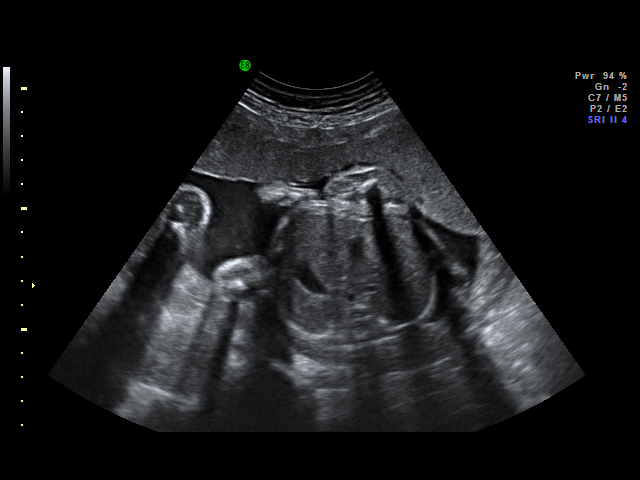
[im 7/17]
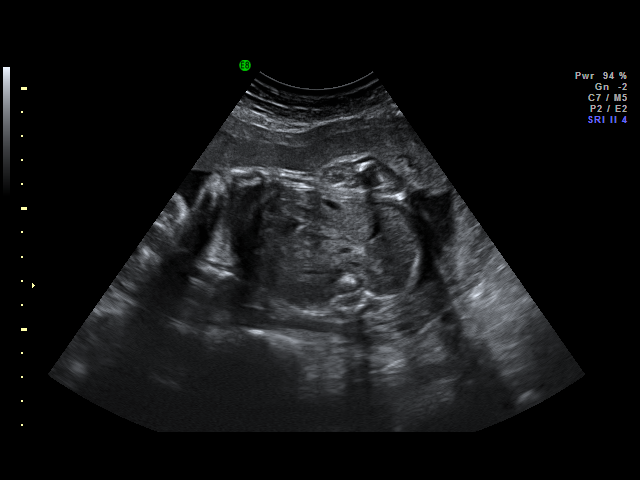
[im 8/17]
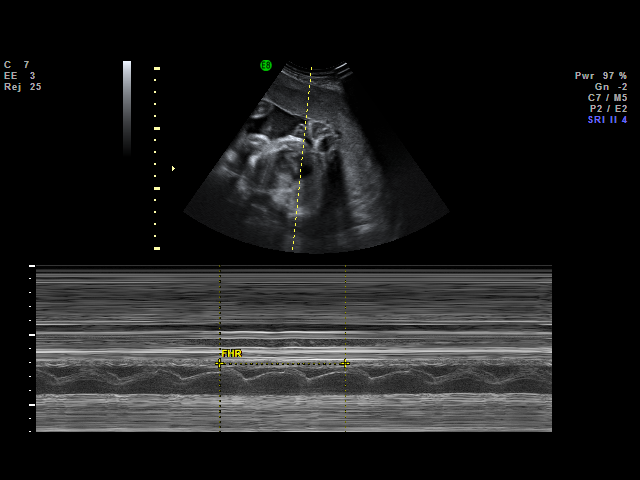
[im 9/17]
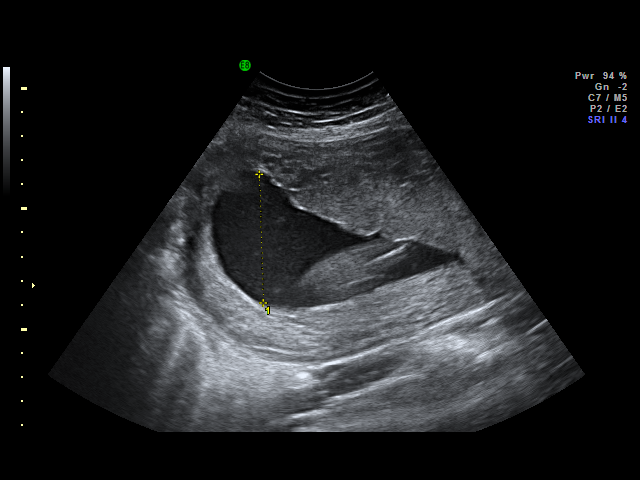
[im 10/17]
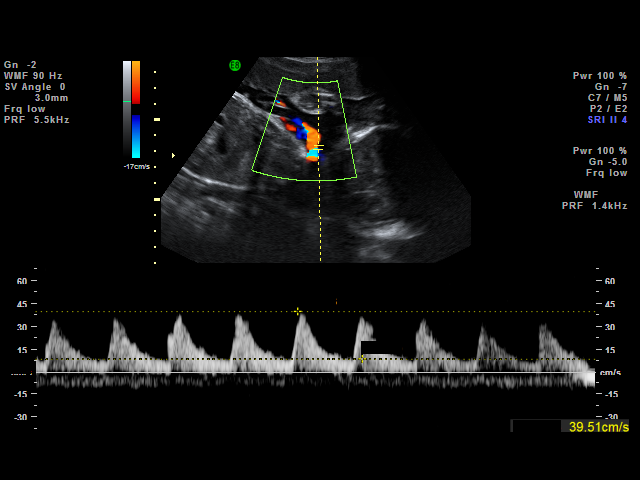
[im 11/17]
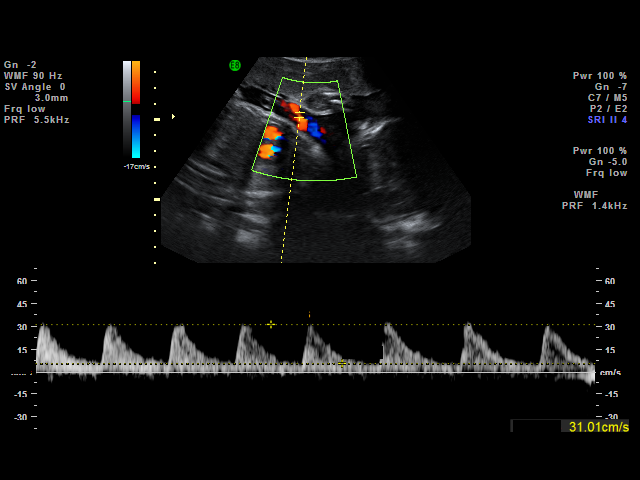
[im 12/17]
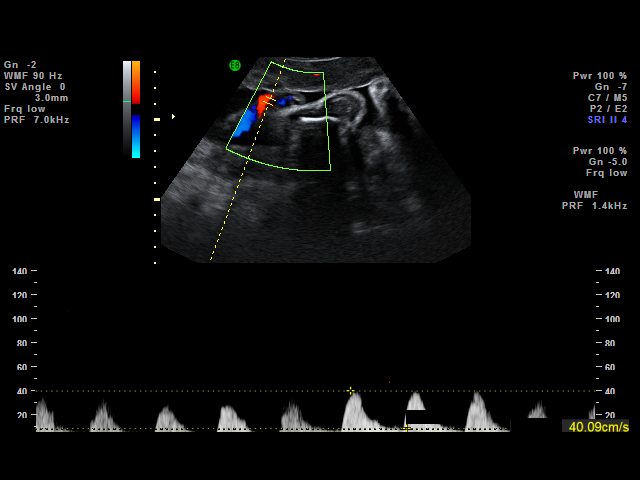
[im 13/17]
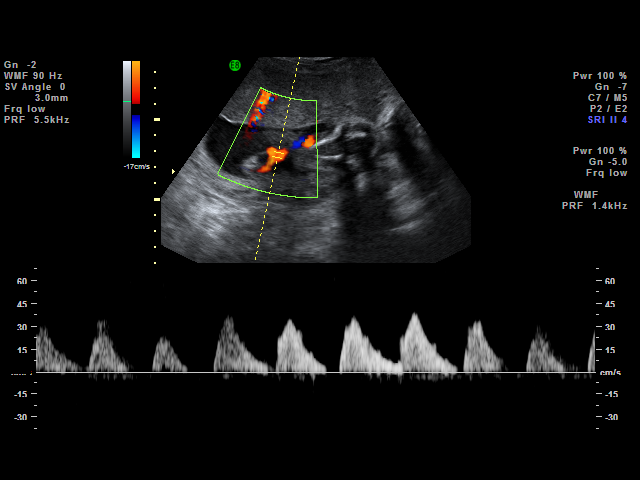
[im 14/17]
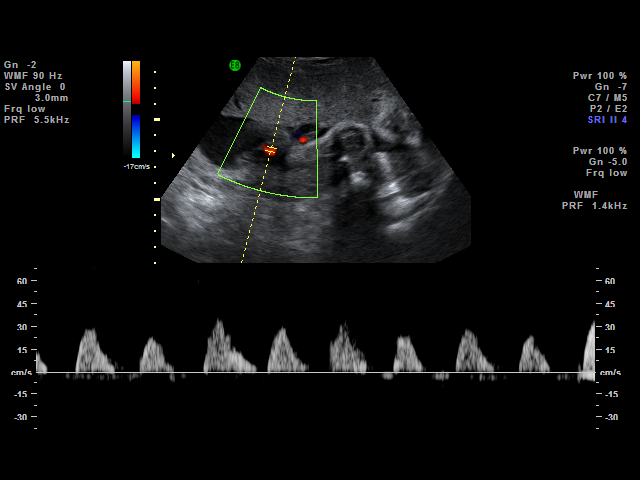
[im 15/17]
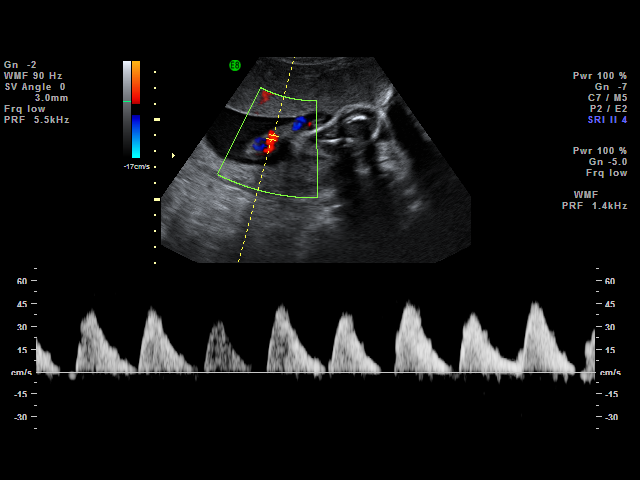
[im 16/17]
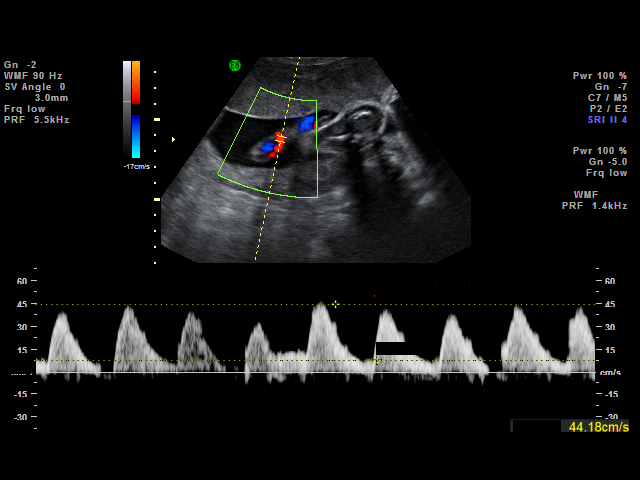
[im 17/17]
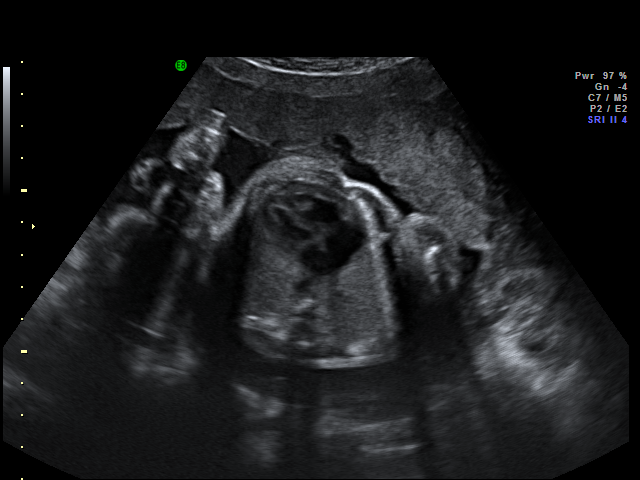

[17 of 17 positions shown; findings below may reference images not displayed]

IMPRESSION: [facility] has also been faxed to the ordering physician.

## 2007-12-16 ENCOUNTER — Encounter: Payer: Self-pay | Admitting: *Deleted

## 2007-12-16 IMAGING — US US UA DOPPLER RE-EVAL
1 series · 14 of 20 positions shown · non-contrast
Comparison: none

OBSTETRICAL ULTRASOUND:
 This ultrasound was performed in The [HOSPITAL], and the [facility] report will be stored to [REDACTED] PACS.

[Series 1: us ua doppler re-eval · 14 of 20 slices shown]
[im 1/20]
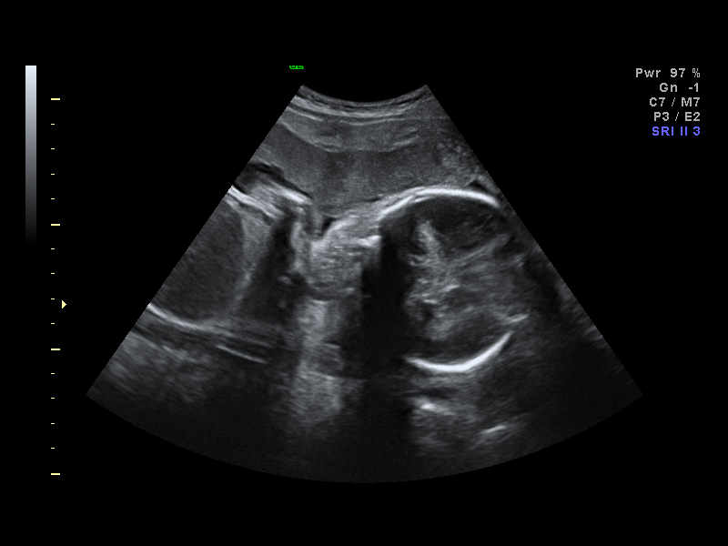
[im 3/20]
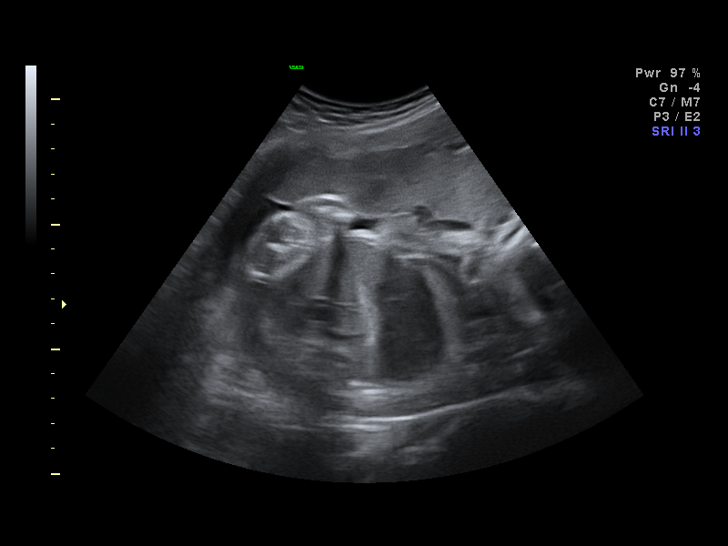
[im 4/20]
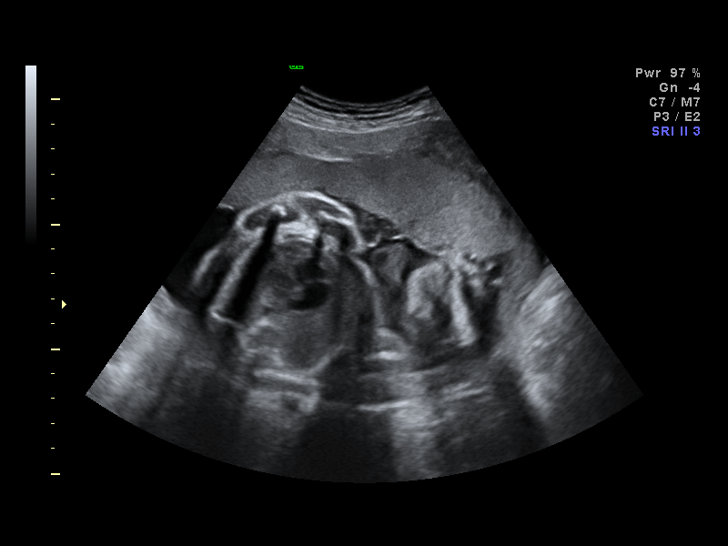
[im 6/20]
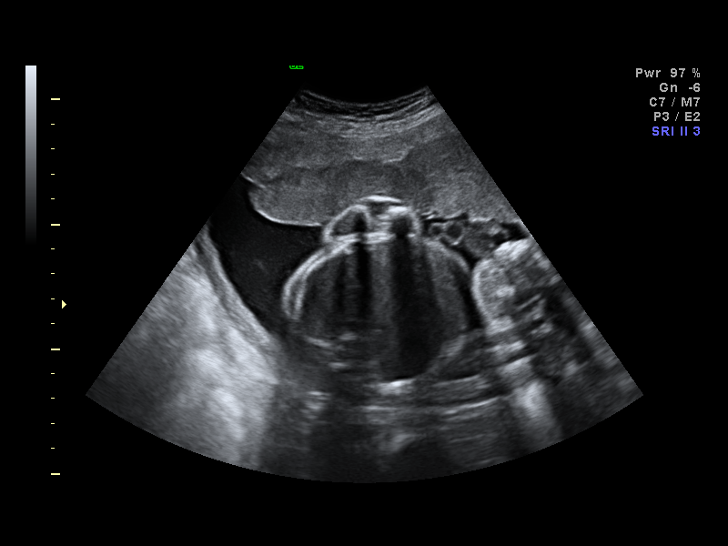
[im 7/20]
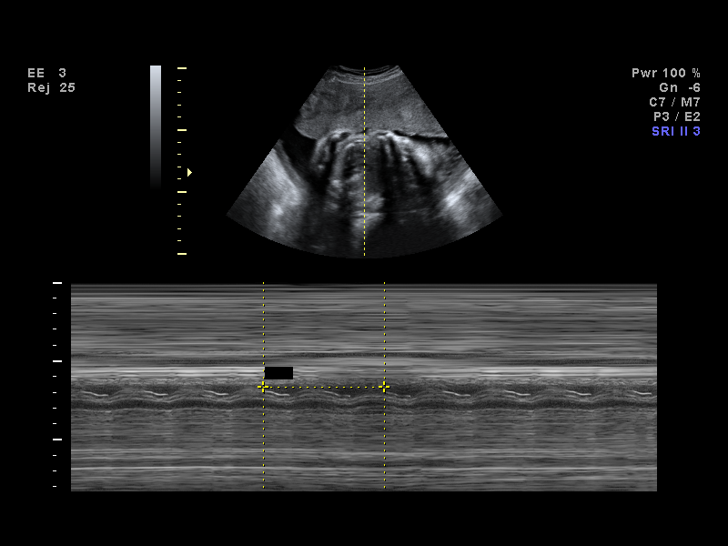
[im 8/20]
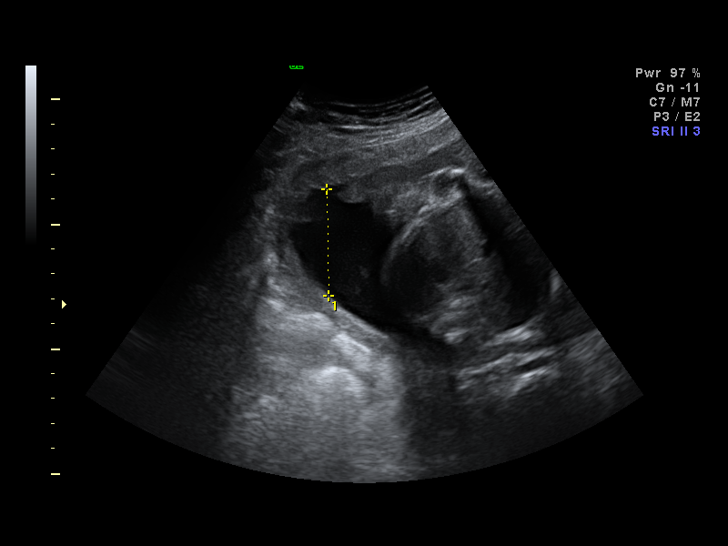
[im 10/20]
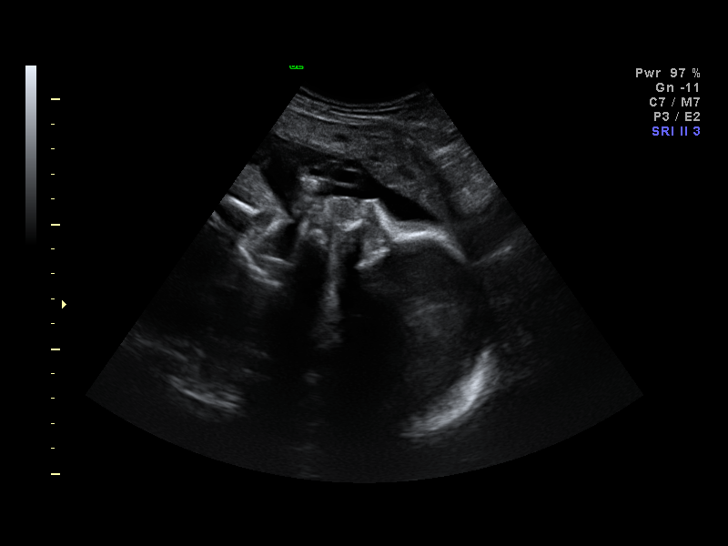
[im 11/20]
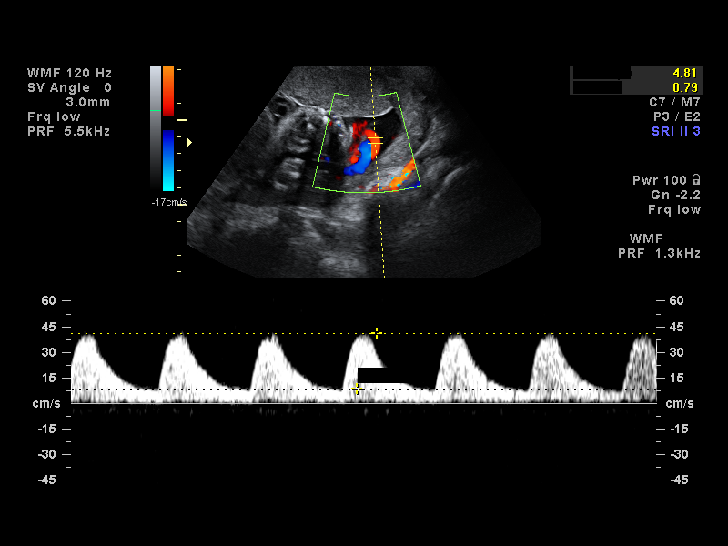
[im 13/20]
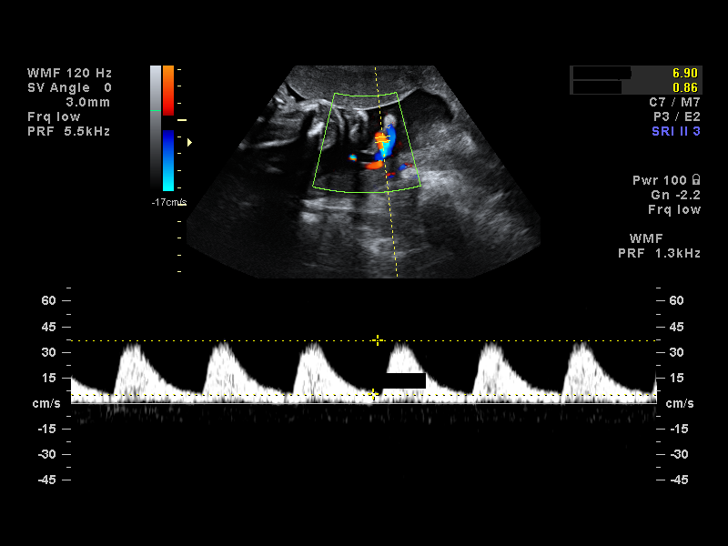
[im 14/20]
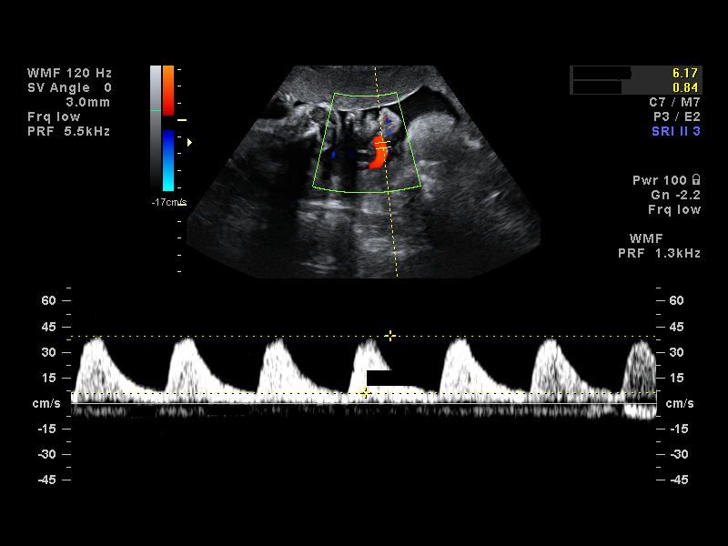
[im 16/20]
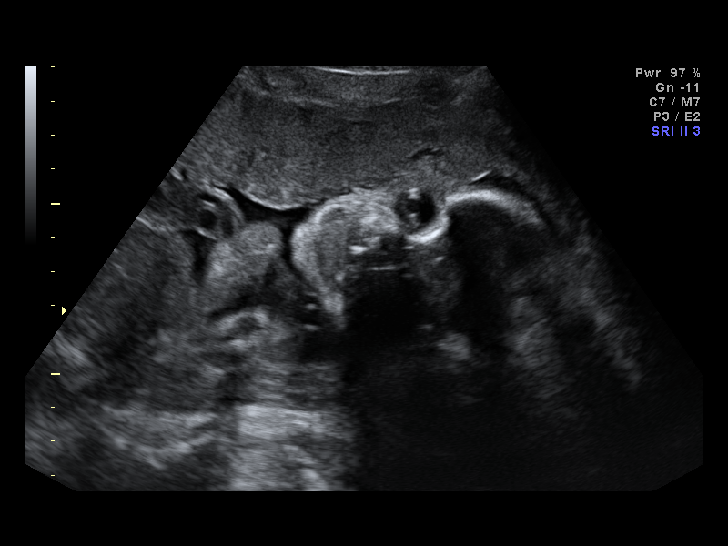
[im 17/20]
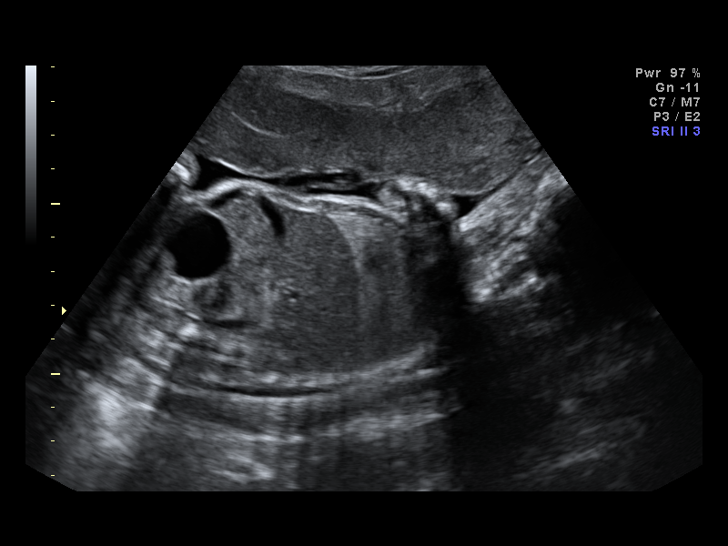
[im 18/20]
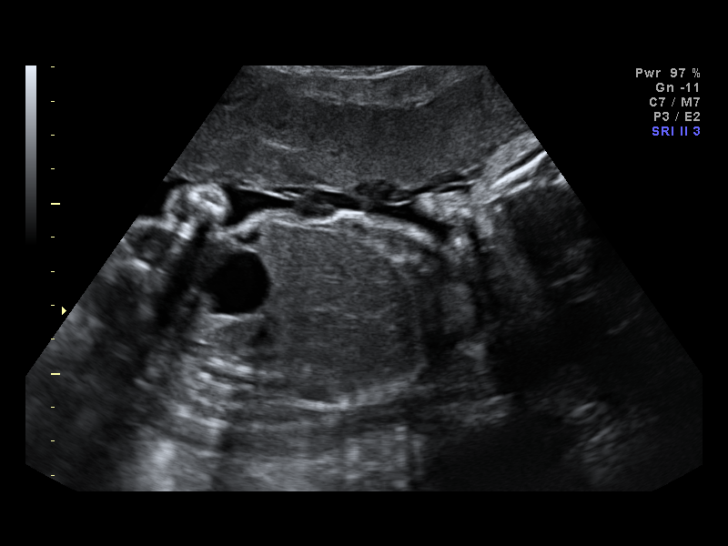
[im 20/20]
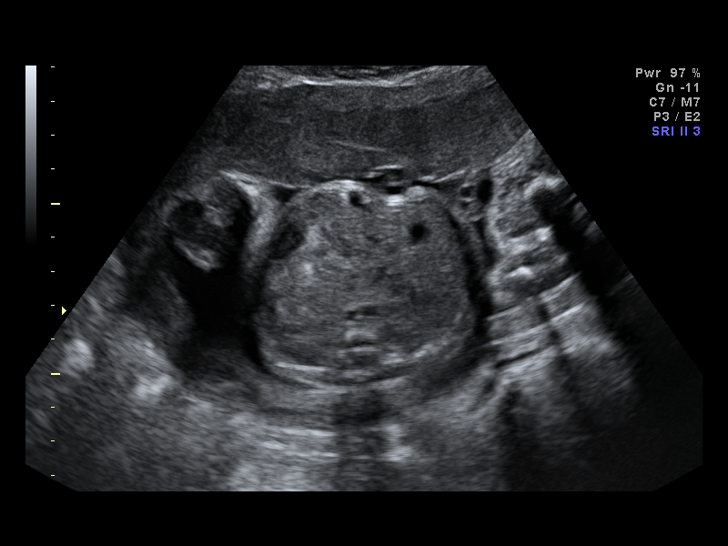

[14 of 20 positions shown; findings below may reference images not displayed]

IMPRESSION: [facility] has also been faxed to the ordering physician.

## 2007-12-17 IMAGING — US US UA DOPPLER RE-EVAL
1 series · 14 of 18 positions shown · non-contrast
Comparison: none

OBSTETRICAL ULTRASOUND:
 This ultrasound exam was performed in the [HOSPITAL] Ultrasound Department.  The OB US report was generated in the [facility] system, and faxed to the ordering physician.  This report is also available in [REDACTED] PACS.

[Series 1: us ua doppler re-eval · 0.28mm/px · 14 of 18 slices shown]
[im 1/18]
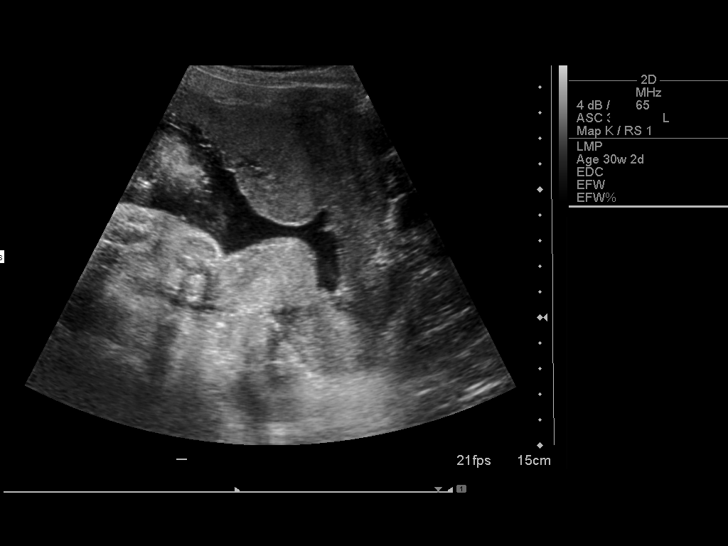
[im 2/18]
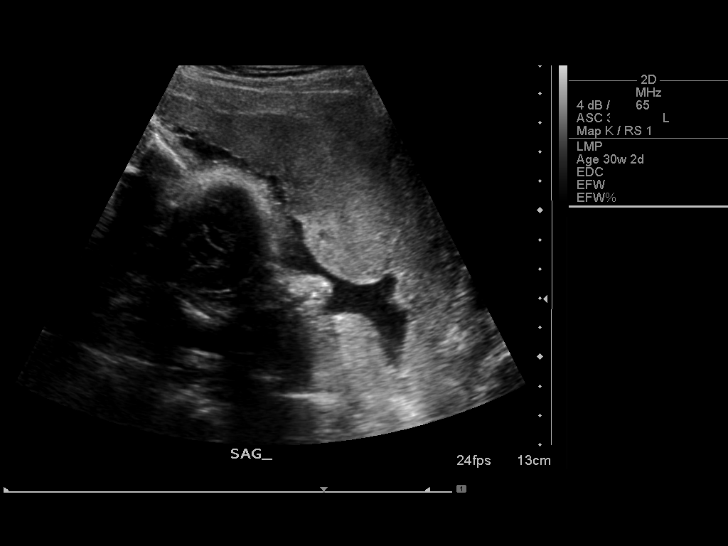
[im 4/18]
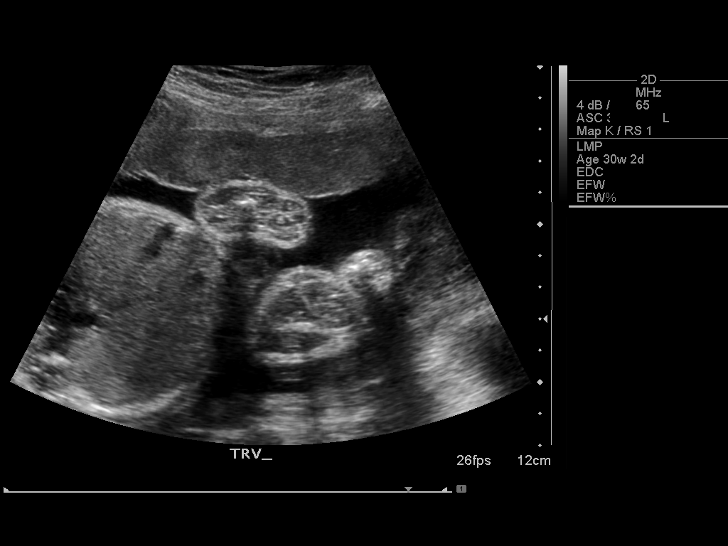
[im 5/18]
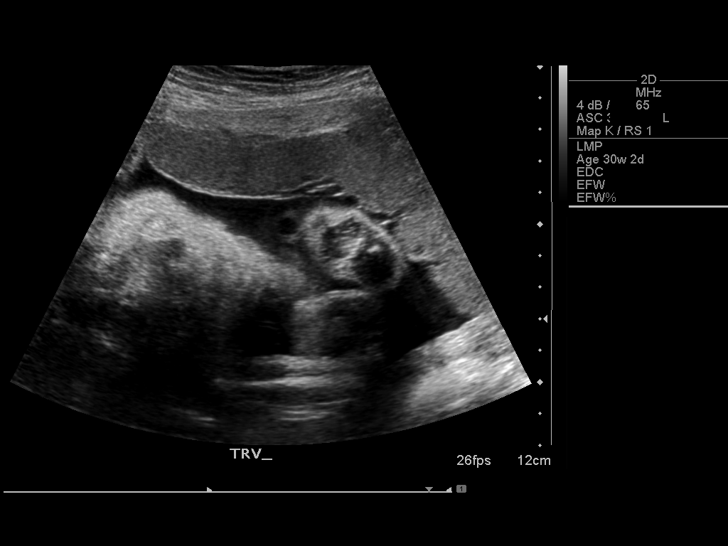
[im 6/18]
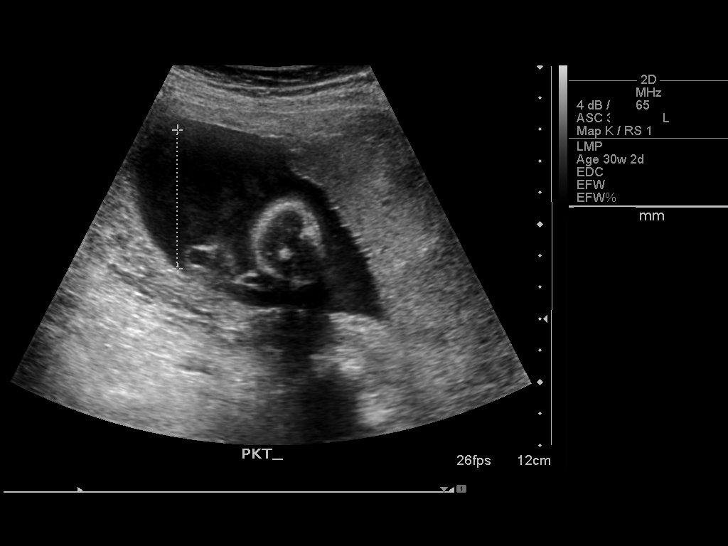
[im 8/18]
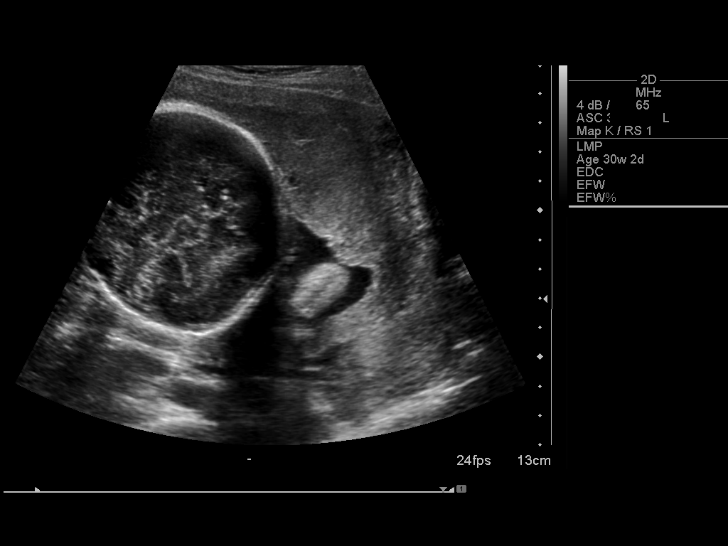
[im 9/18]
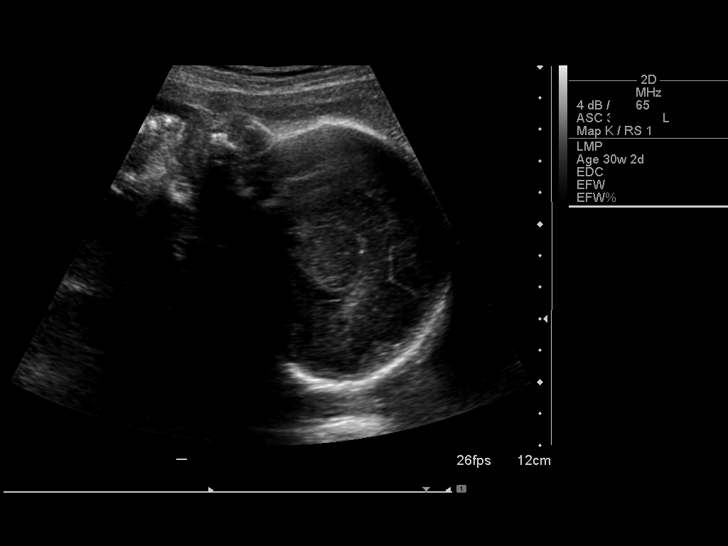
[im 10/18]
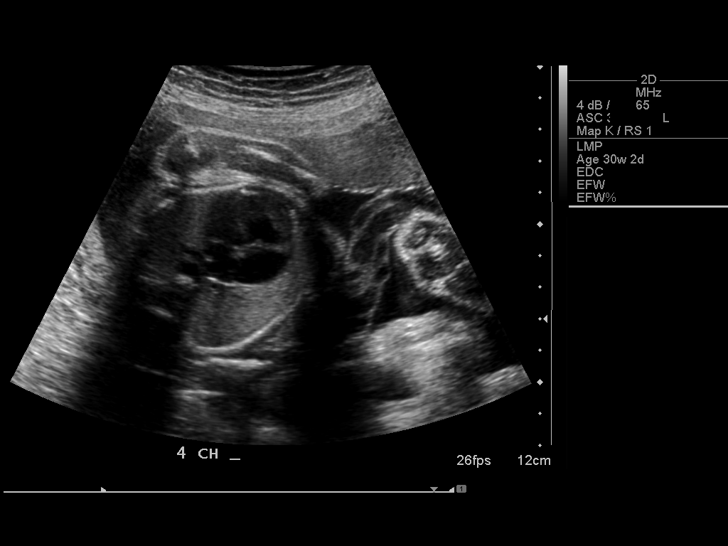
[im 11/18]
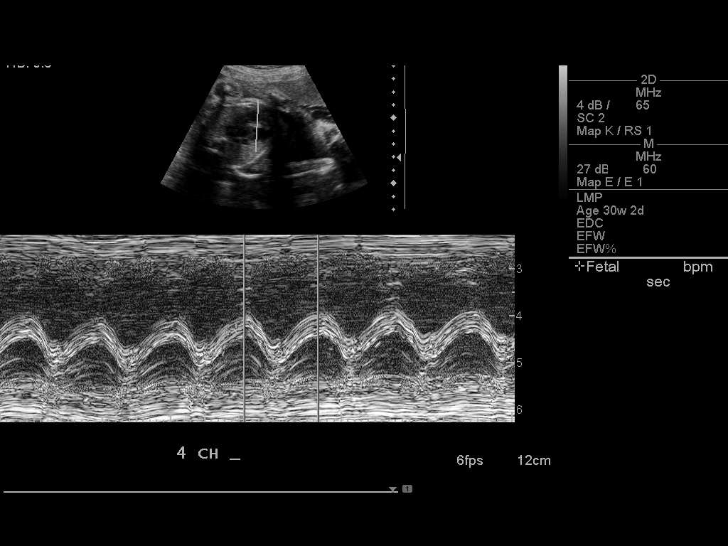
[im 13/18]
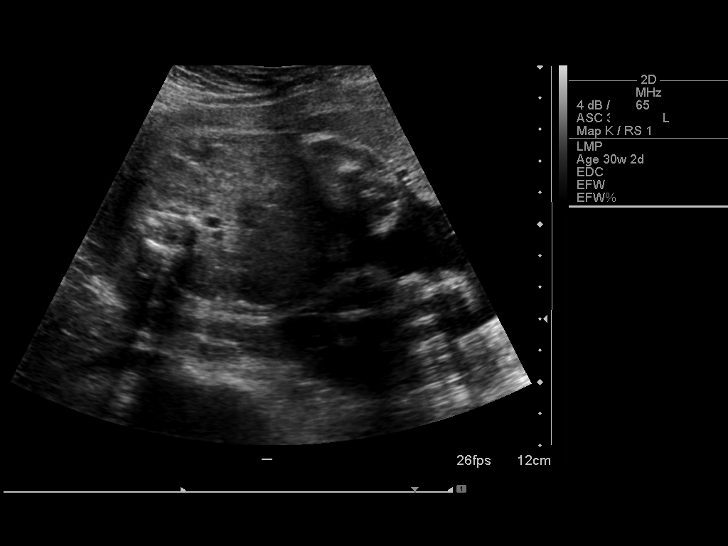
[im 14/18]
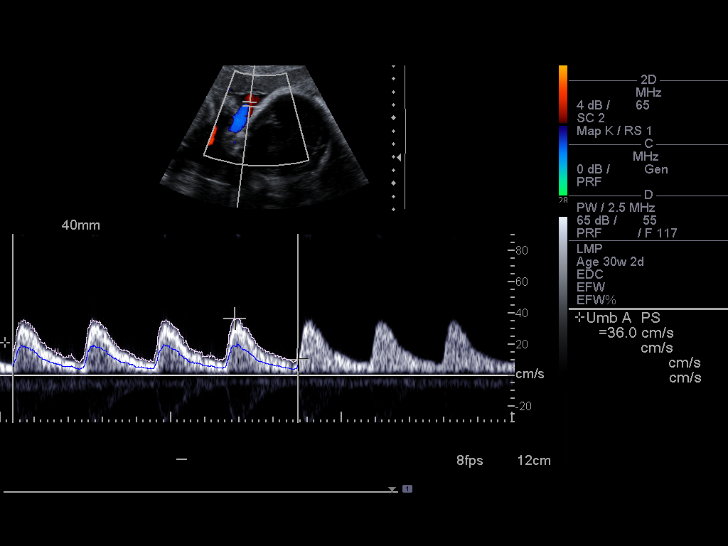
[im 15/18]
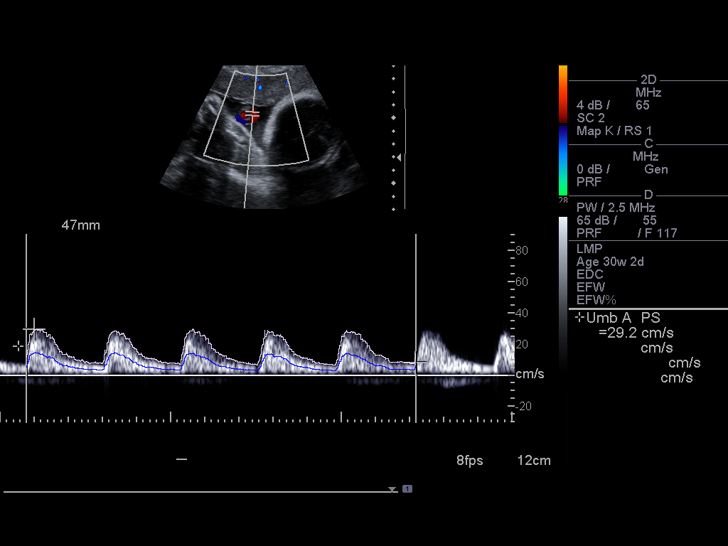
[im 17/18]
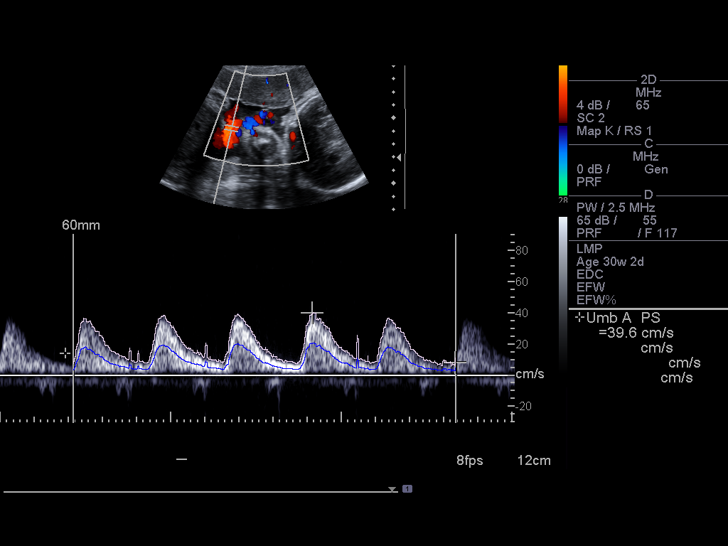
[im 18/18]
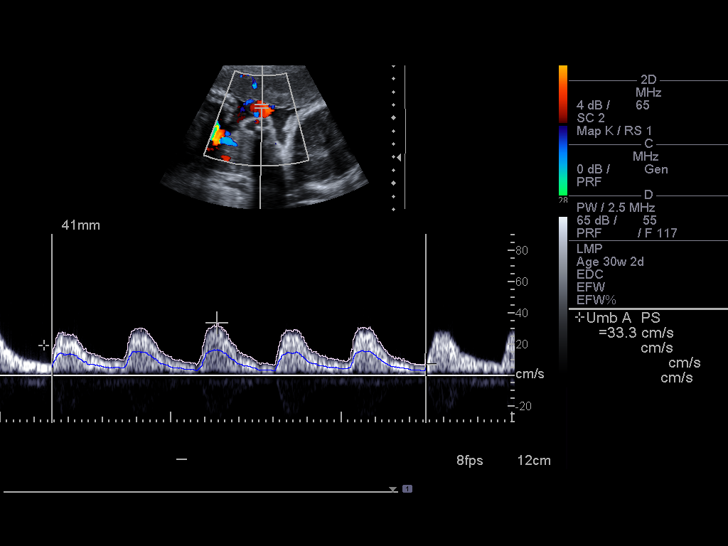

[14 of 18 positions shown; findings below may reference images not displayed]

IMPRESSION: See [facility] Obstetric US report.

## 2007-12-19 ENCOUNTER — Encounter: Payer: Self-pay | Admitting: *Deleted

## 2007-12-19 IMAGING — US US UA DOPPLER RE-EVAL
1 series · 17 of 17 positions shown · non-contrast
Comparison: none

OBSTETRICAL ULTRASOUND:
 This ultrasound was performed in The [HOSPITAL], and the [facility] report will be stored to [REDACTED] PACS.

[Series 1: us ua doppler re-eval · 17 of 17 slices shown]
[im 1/17]
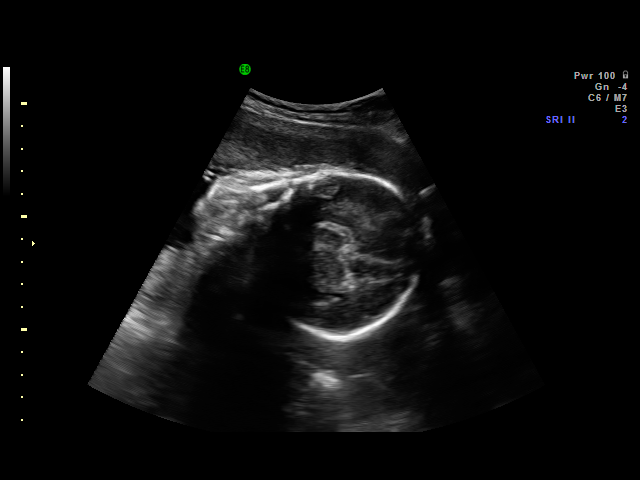
[im 2/17]
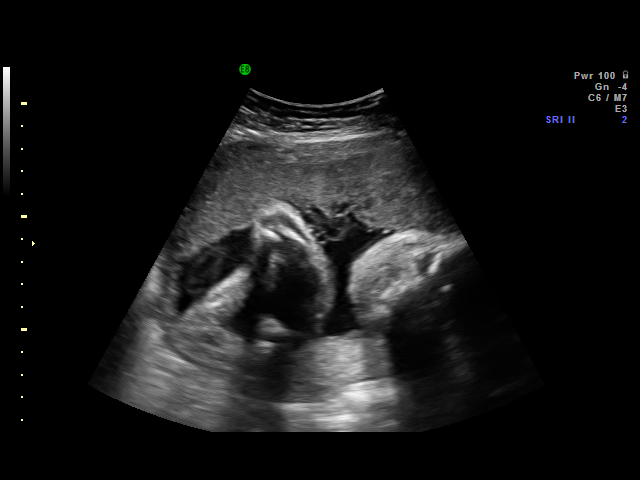
[im 3/17]
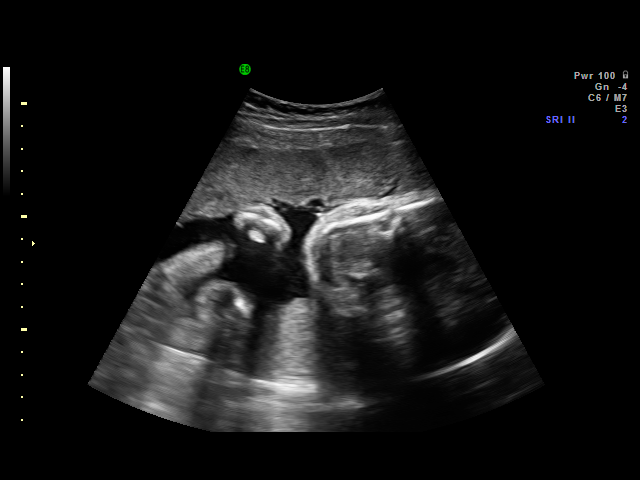
[im 4/17]
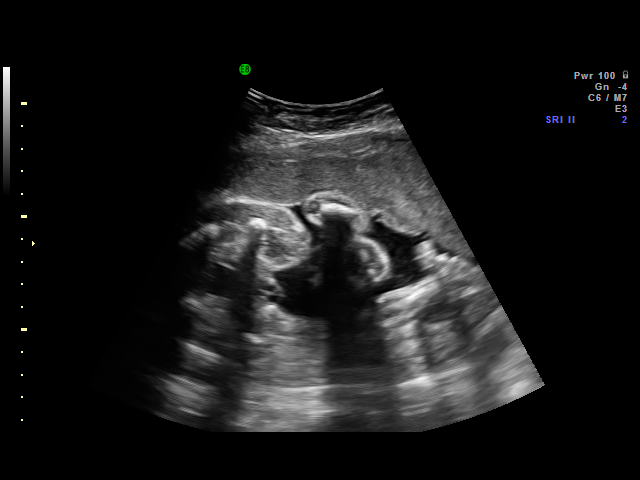
[im 5/17]
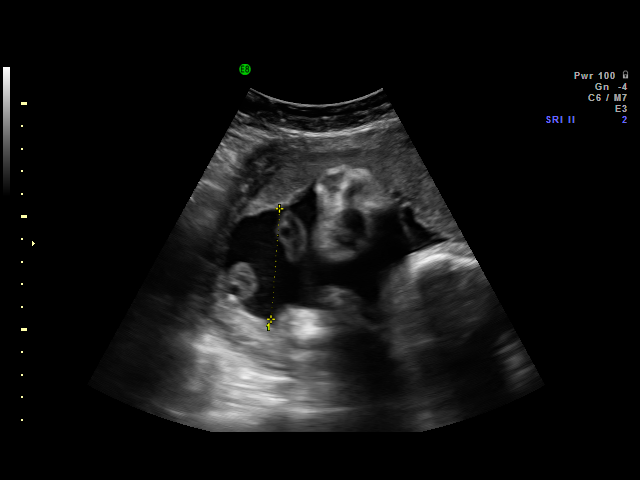
[im 6/17]
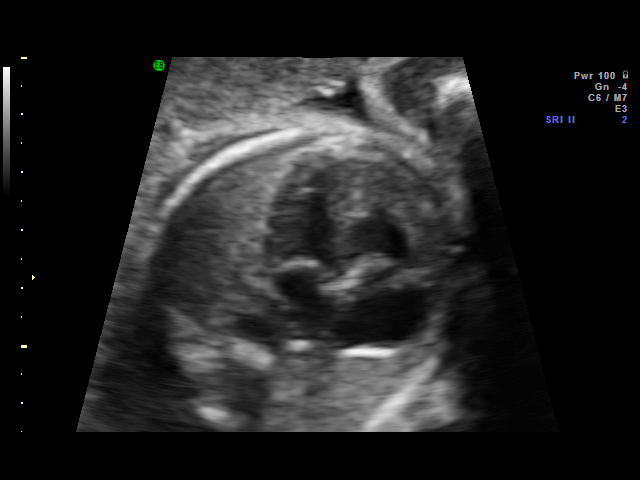
[im 7/17]
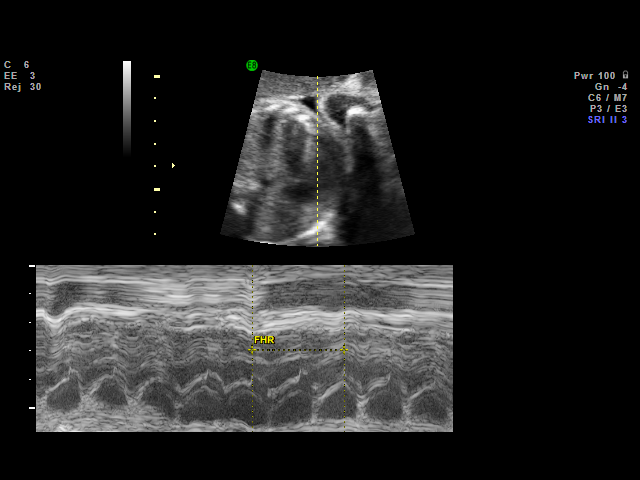
[im 8/17]
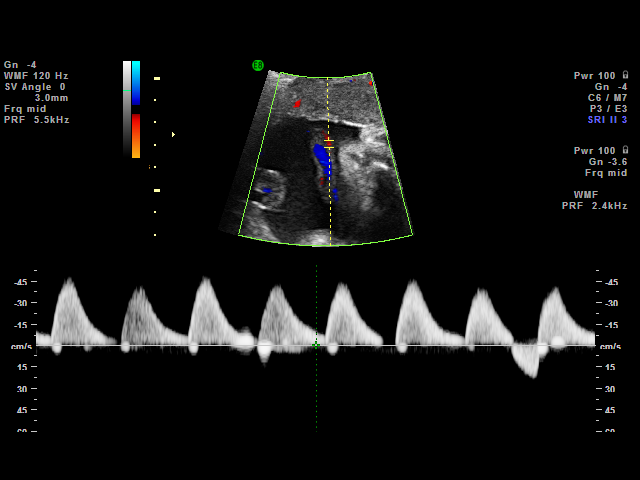
[im 9/17]
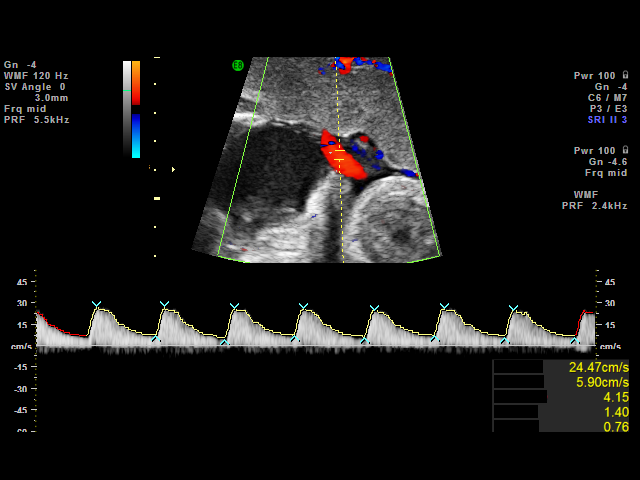
[im 10/17]
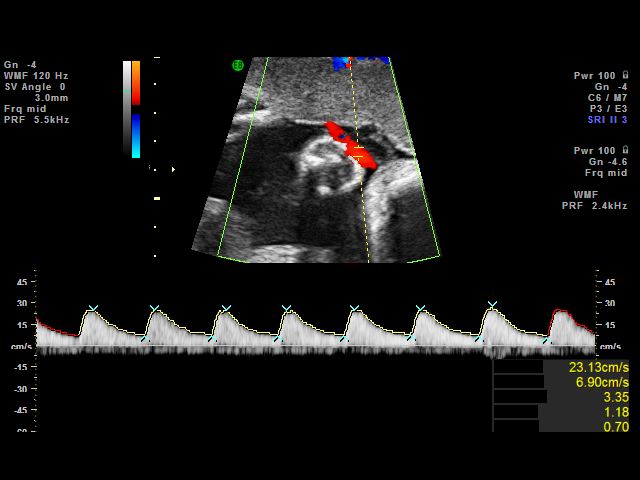
[im 11/17]
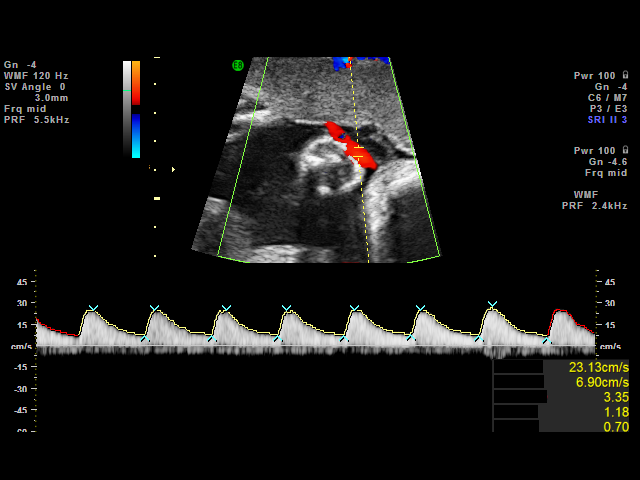
[im 12/17]
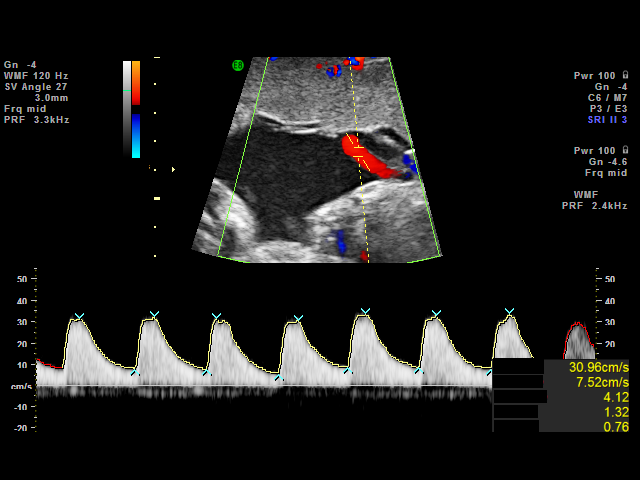
[im 13/17]
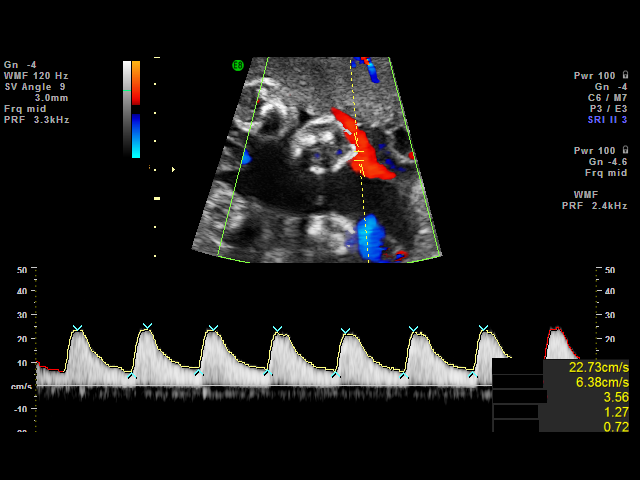
[im 14/17]
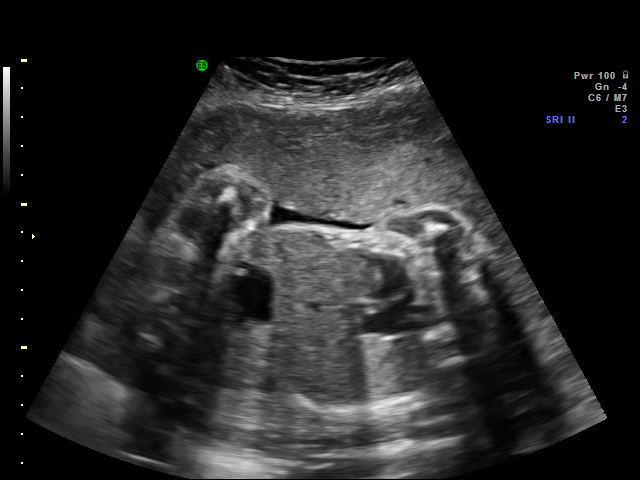
[im 15/17]
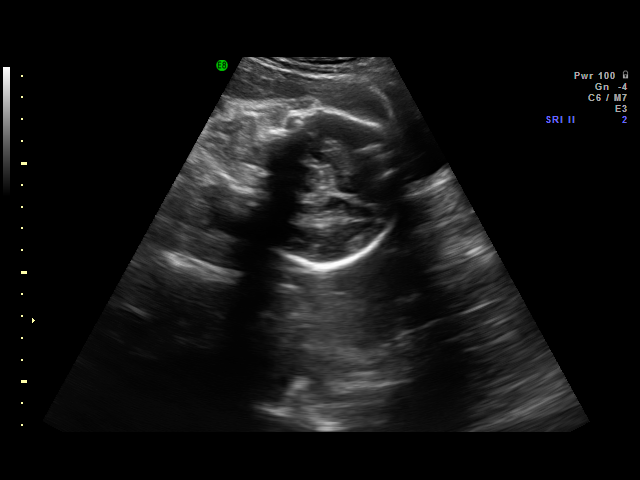
[im 16/17]
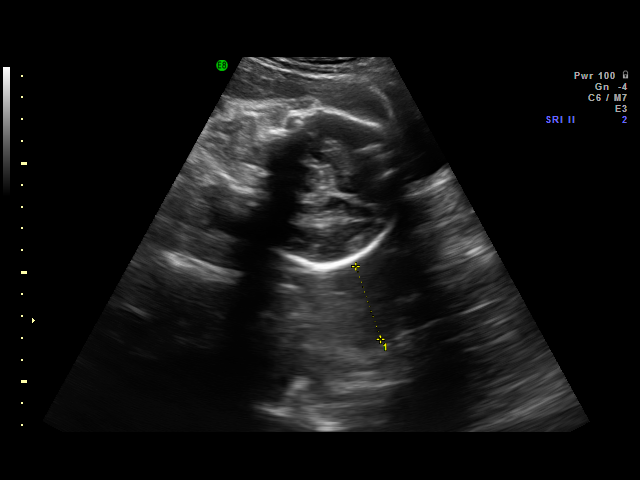
[im 17/17]
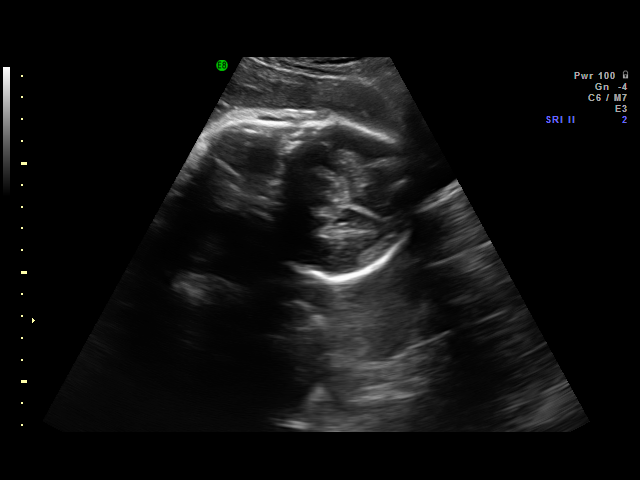

[17 of 17 positions shown; findings below may reference images not displayed]

IMPRESSION: [facility] has also been faxed to the ordering physician.

## 2007-12-20 ENCOUNTER — Encounter: Payer: Self-pay | Admitting: *Deleted

## 2007-12-20 IMAGING — US US UA DOPPLER RE-EVAL
1 series · 14 of 28 positions shown · non-contrast
Comparison: none

OBSTETRICAL ULTRASOUND:
 This ultrasound was performed in The [HOSPITAL], and the [facility] report will be stored to [REDACTED] PACS.

[Series 1: us ua doppler re-eval · 14 of 40 slices shown]
[im 2/40]
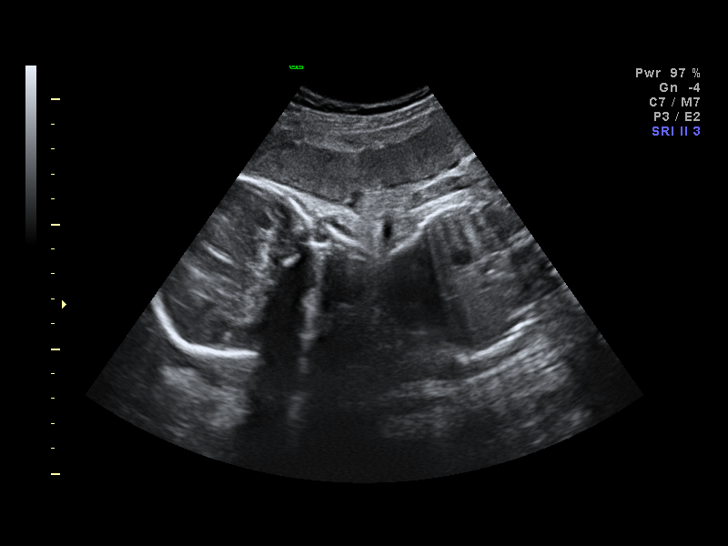
[im 5/40]
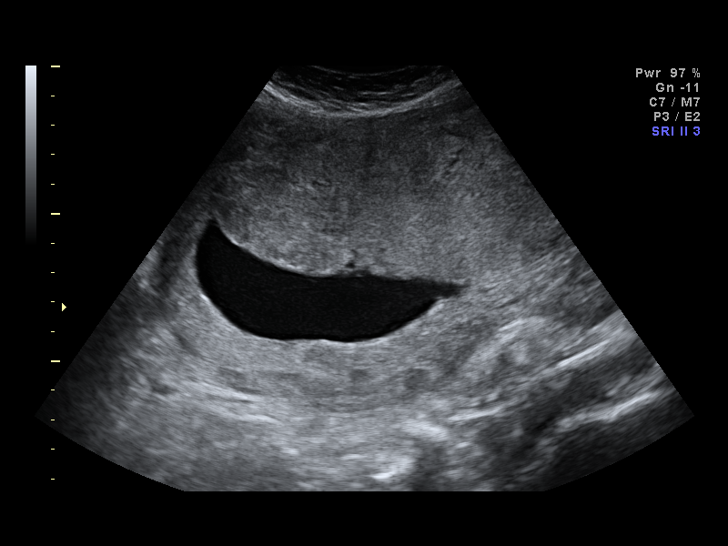
[im 8/40]
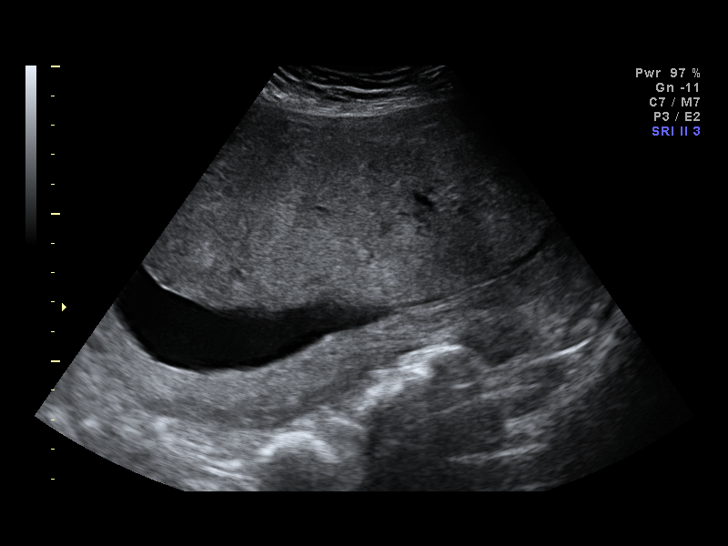
[im 11/40]
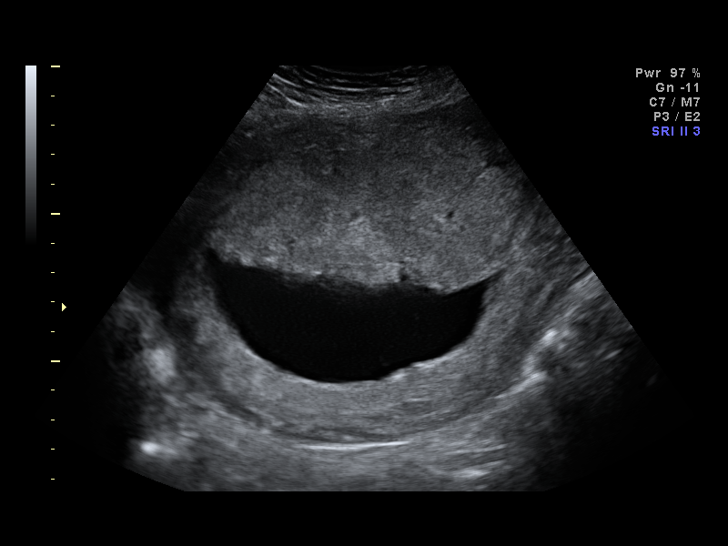
[im 14/40]
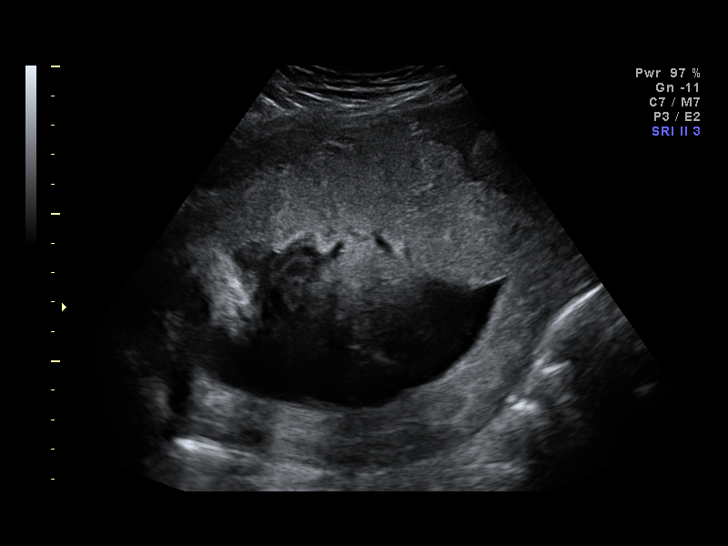
[im 16/40]
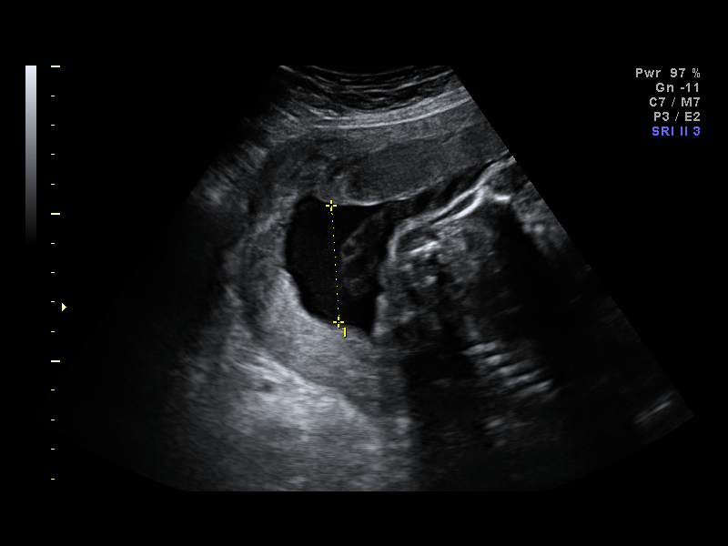
[im 19/40]
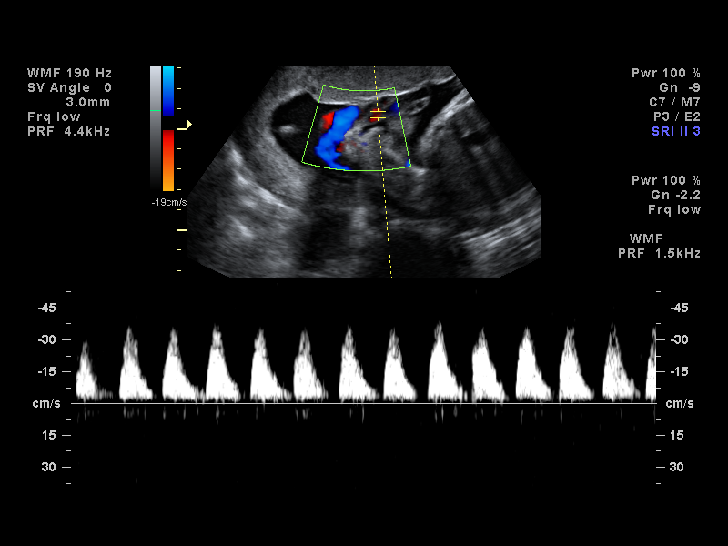
[im 22/40]
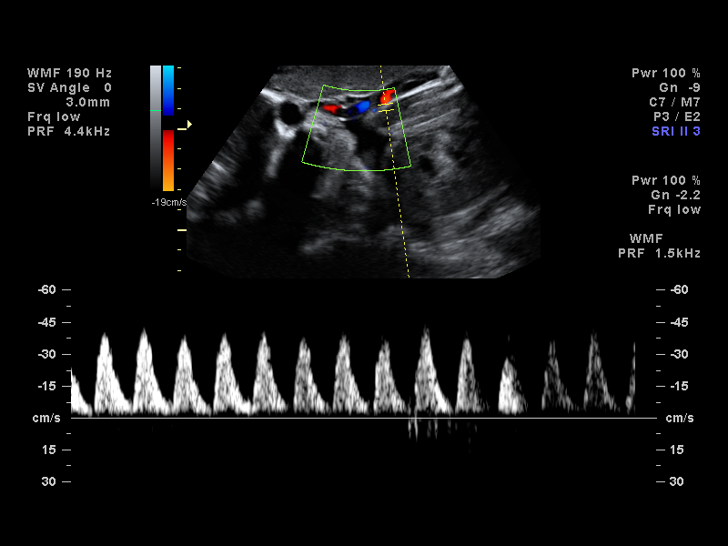
[im 25/40]
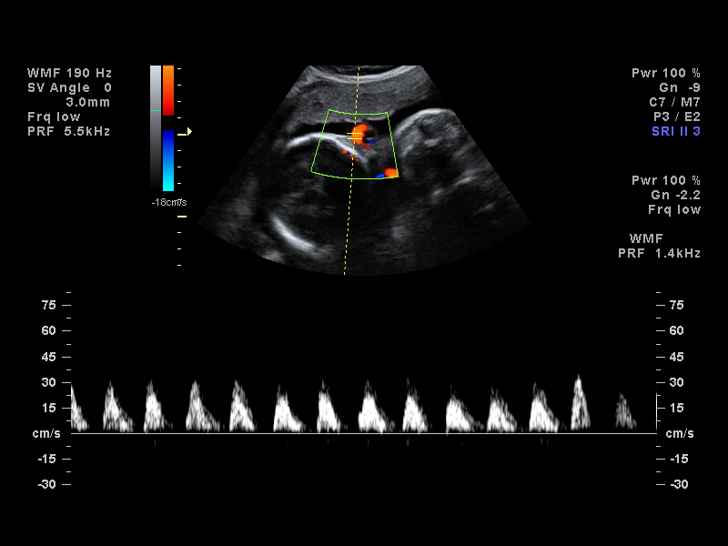
[im 28/40]
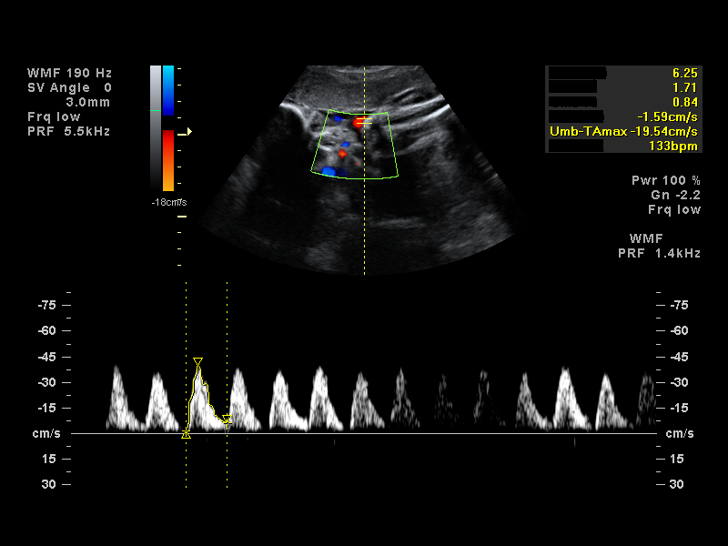
[im 31/40]
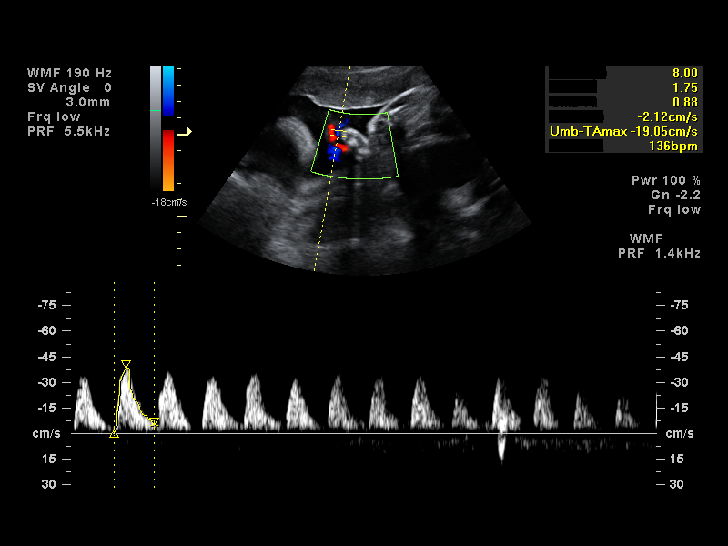
[im 34/40]
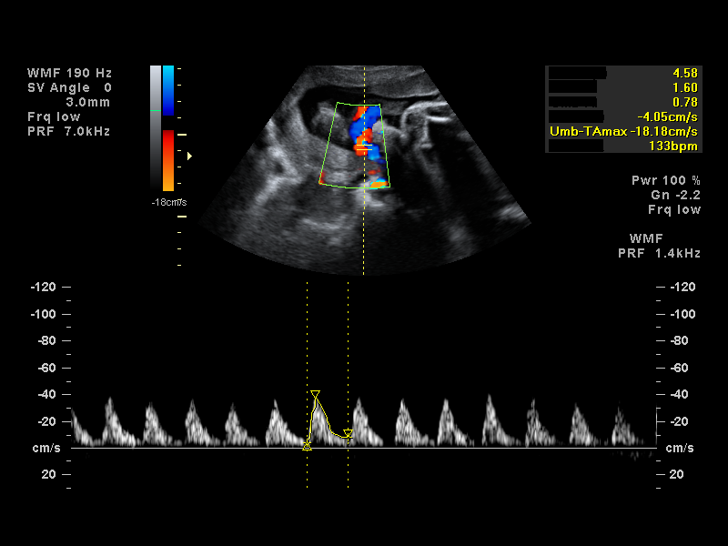
[im 37/40]
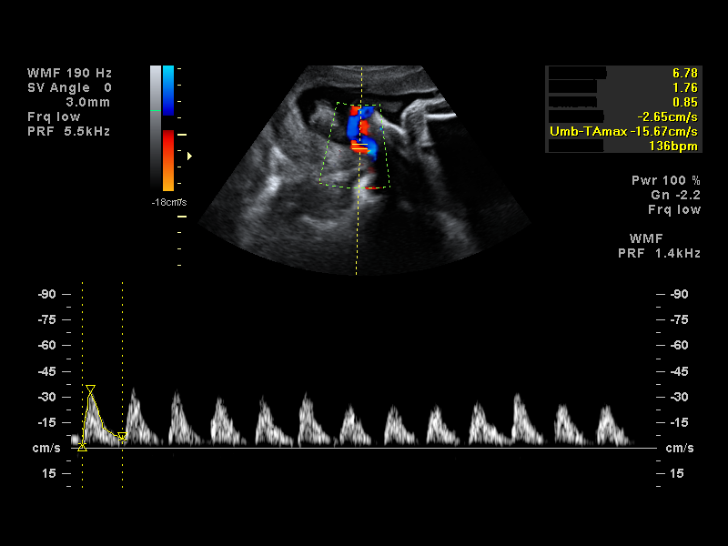
[im 40/40]
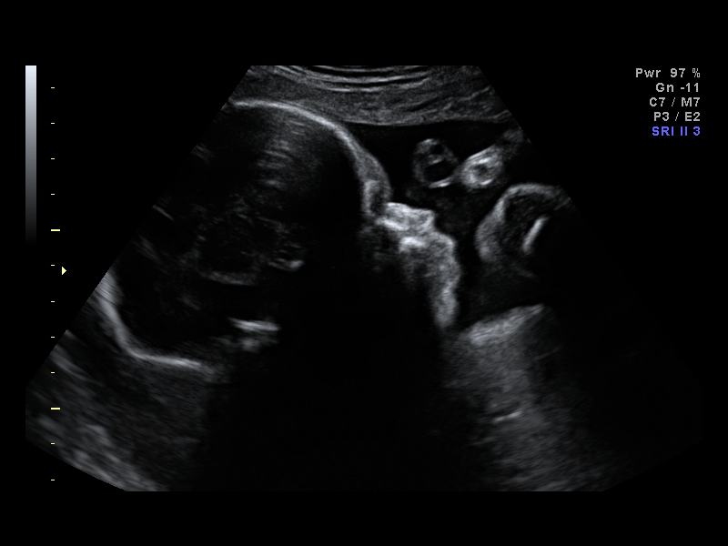

[14 of 28 positions shown; findings below may reference images not displayed]

IMPRESSION: [facility] has also been faxed to the ordering physician.

## 2007-12-21 ENCOUNTER — Encounter: Payer: Self-pay | Admitting: *Deleted

## 2007-12-21 IMAGING — US US UA DOPPLER RE-EVAL
1 series · 14 of 16 positions shown · non-contrast
Comparison: none

OBSTETRICAL ULTRASOUND:
 This ultrasound was performed in The [HOSPITAL], and the [facility] report will be stored to [REDACTED] PACS.

[Series 1: us ua doppler re-eval · 14 of 16 slices shown]
[im 1/16]
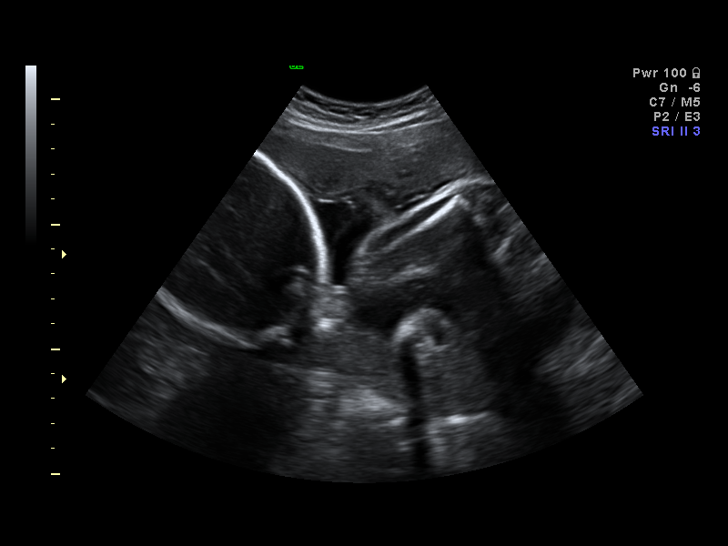
[im 2/16]
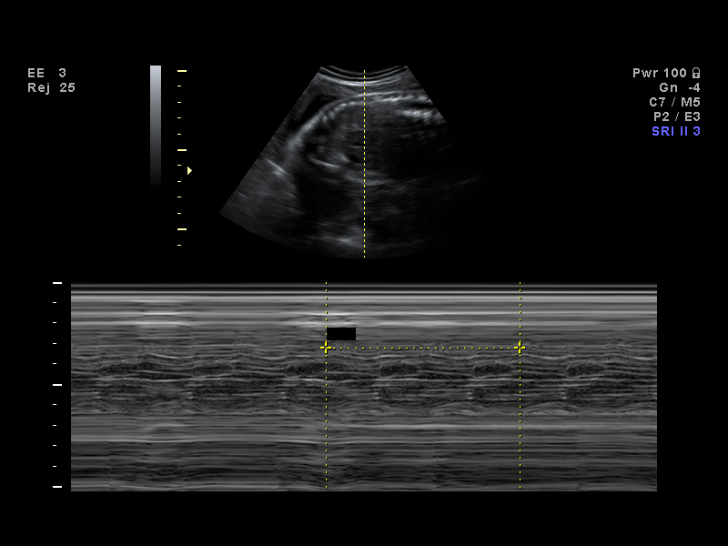
[im 3/16]
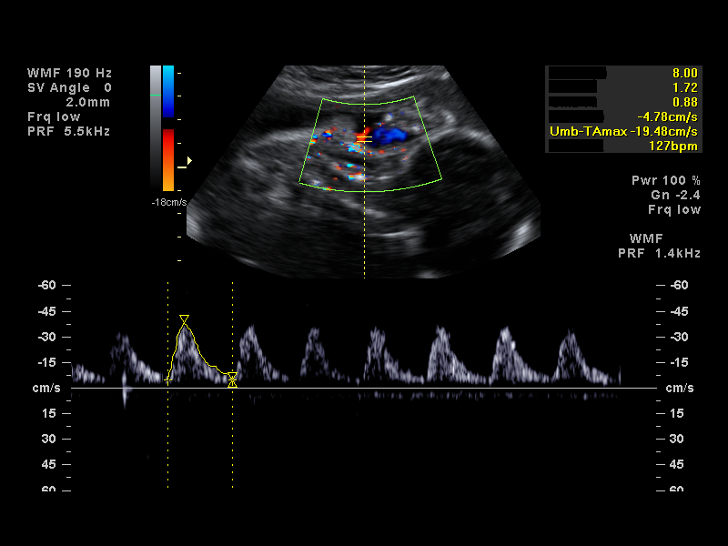
[im 5/16]
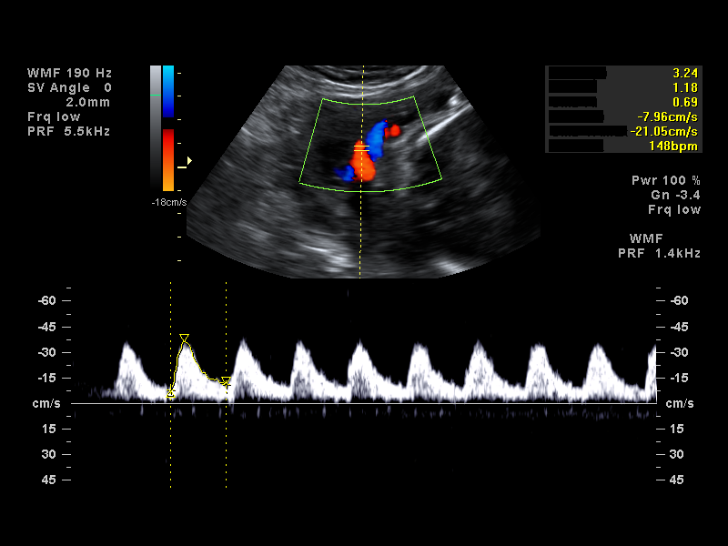
[im 6/16]
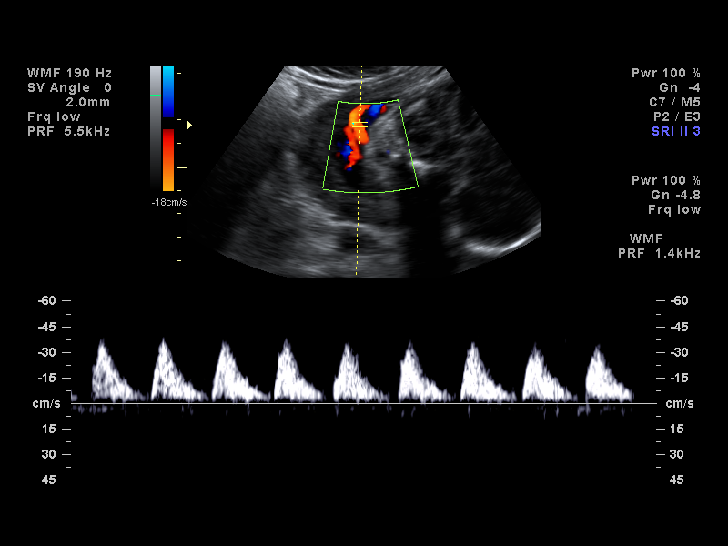
[im 7/16]
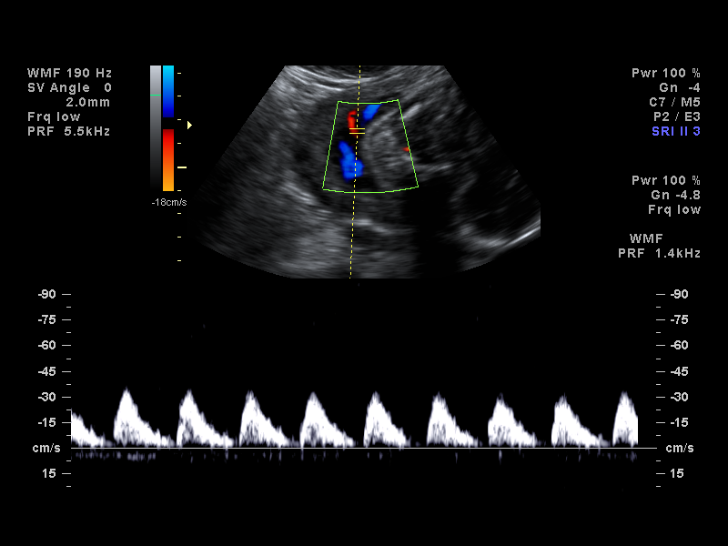
[im 8/16]
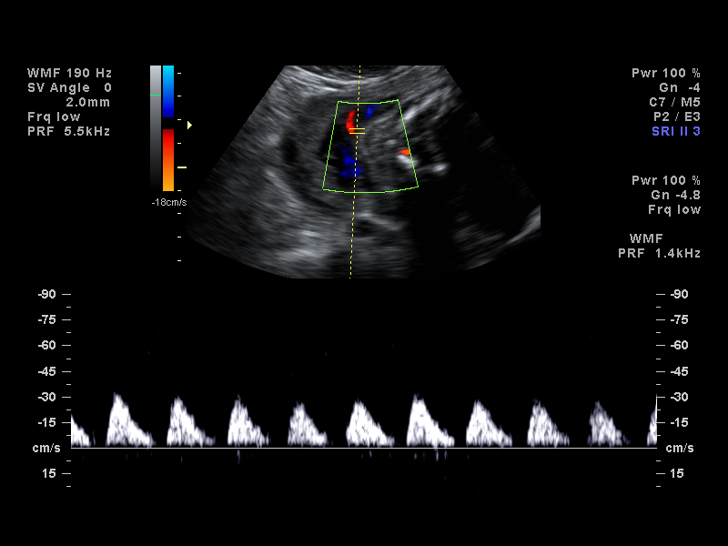
[im 9/16]
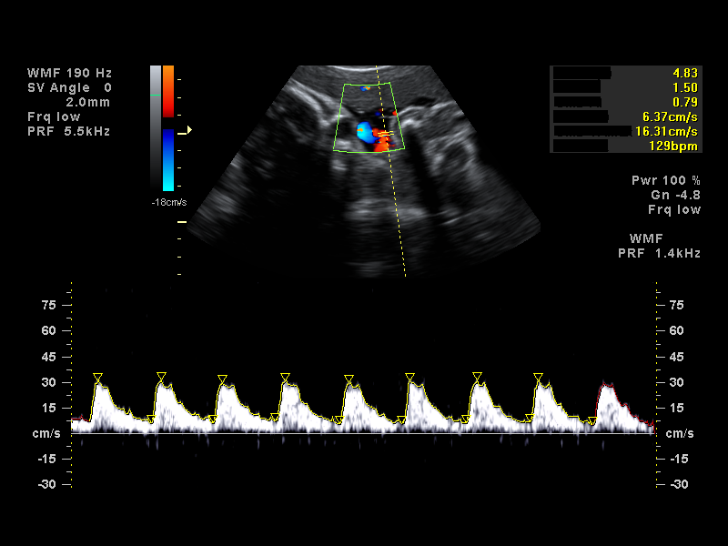
[im 10/16]
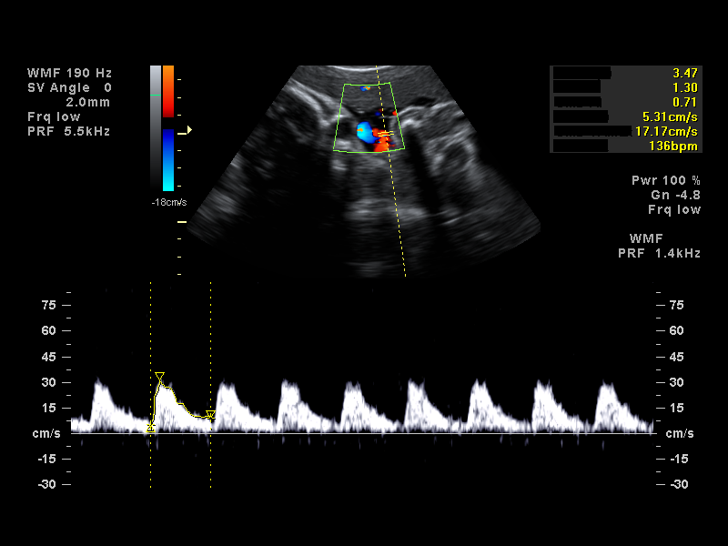
[im 11/16]
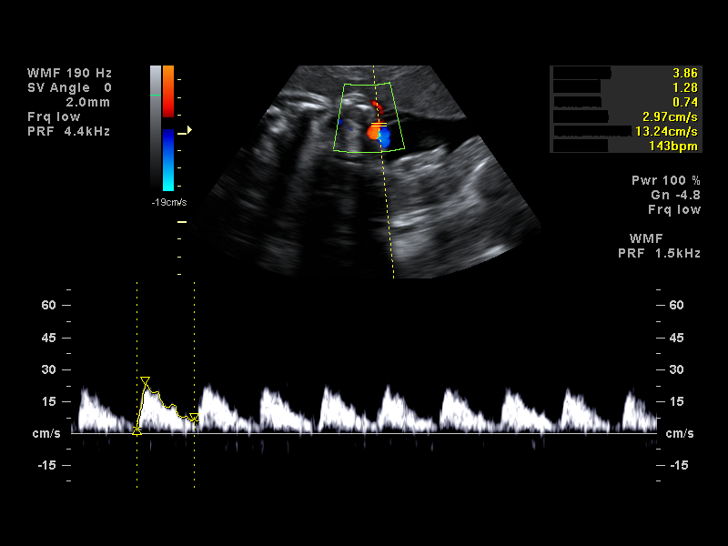
[im 13/16]
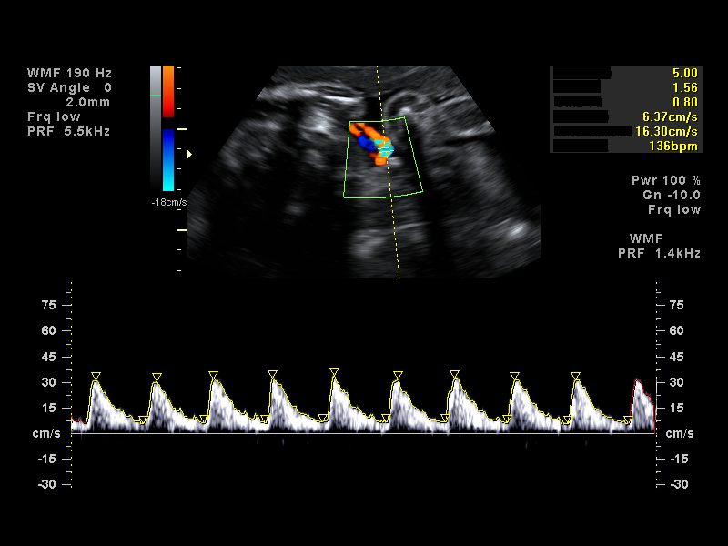
[im 14/16]
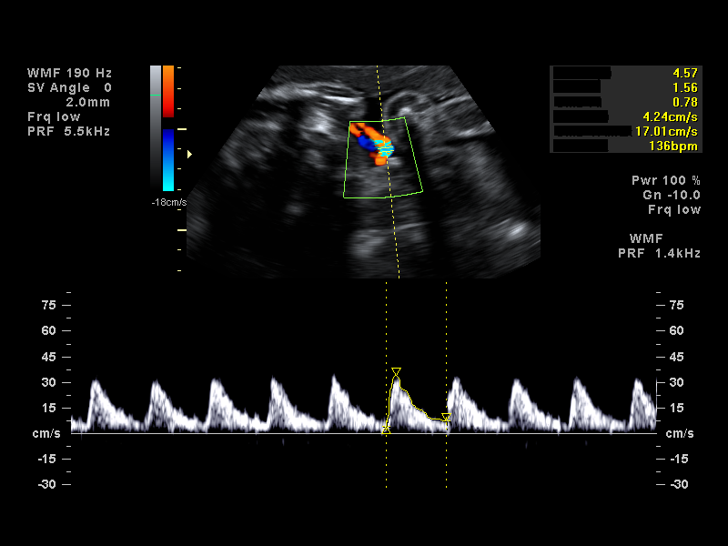
[im 15/16]
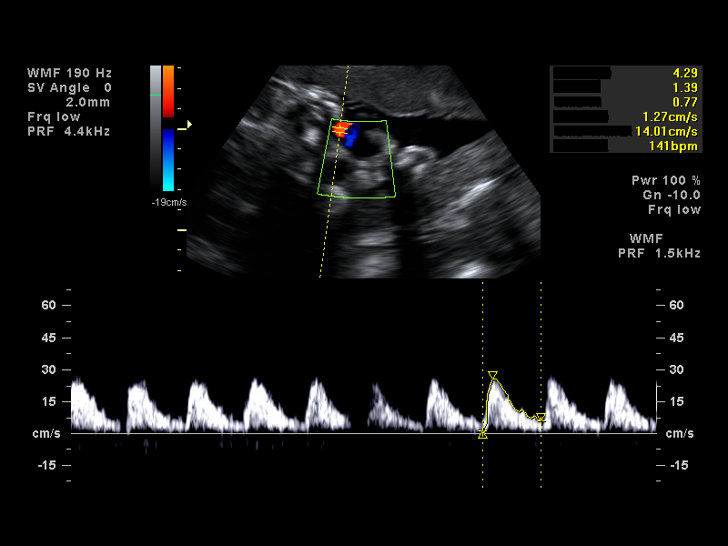
[im 16/16]
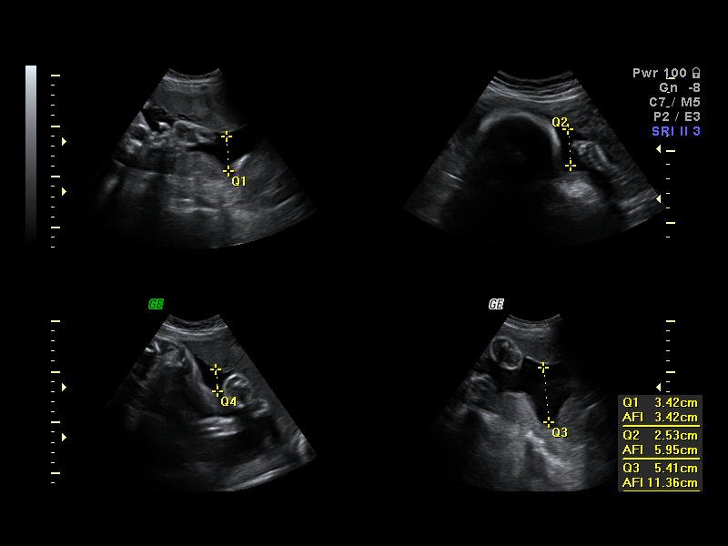

[14 of 16 positions shown; findings below may reference images not displayed]

IMPRESSION: [facility] has also been faxed to the ordering physician.

## 2007-12-22 ENCOUNTER — Encounter: Payer: Self-pay | Admitting: *Deleted

## 2007-12-22 IMAGING — US US UA DOPPLER RE-EVAL
1 series · 14 of 28 positions shown · non-contrast
Comparison: none

OBSTETRICAL ULTRASOUND:
 This ultrasound was performed in The [HOSP[facility]], and the [facility] report will be stored to [REDACTED] PACS.

[Series 1: us ua doppler re-eval · 14 of 29 slices shown]
[im 2/29]
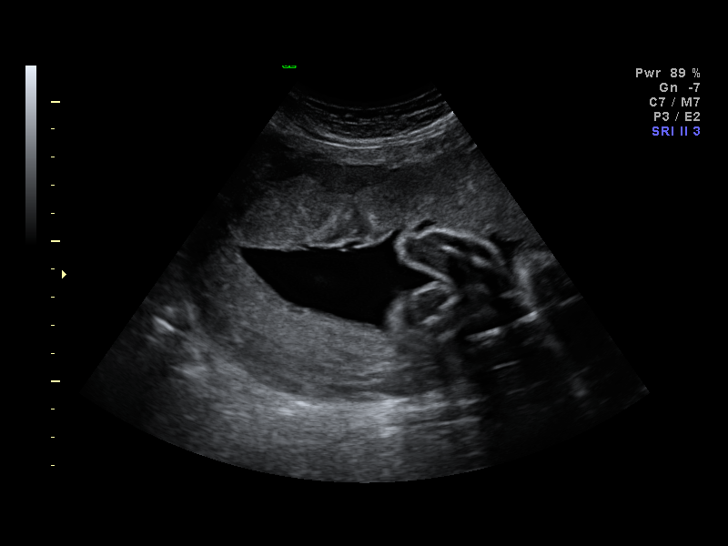
[im 4/29]
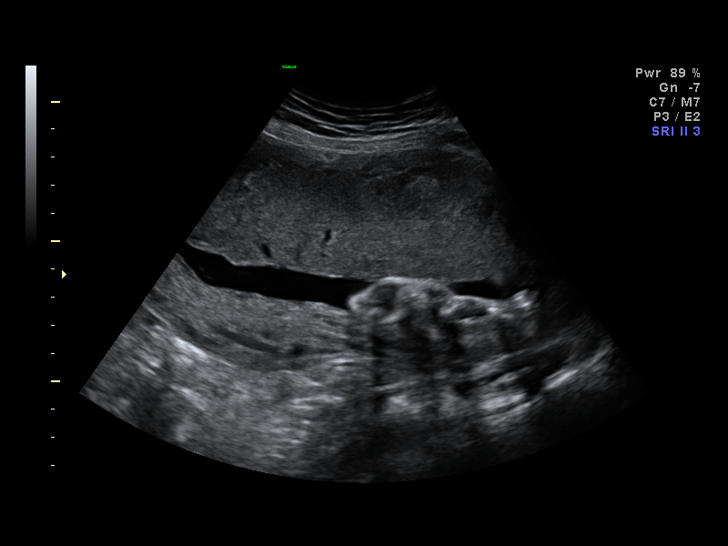
[im 6/29]
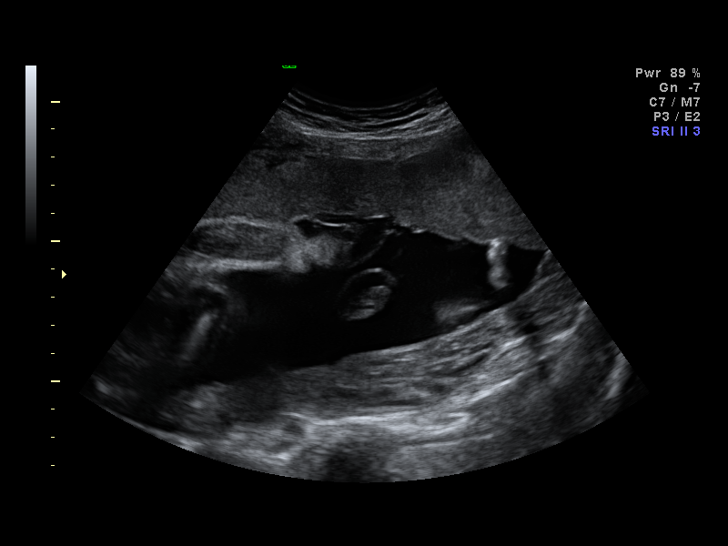
[im 8/29]
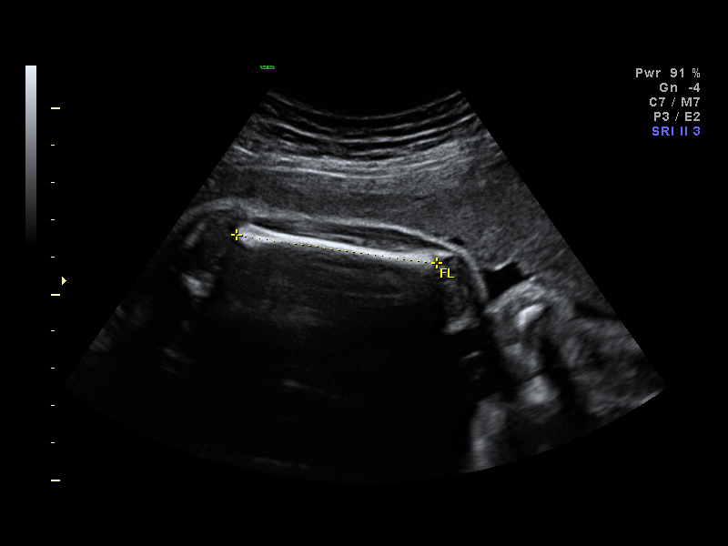
[im 10/29]
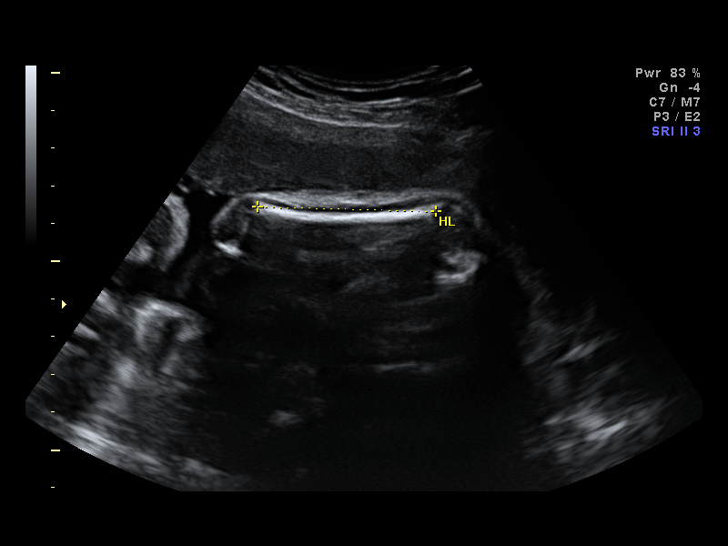
[im 12/29]
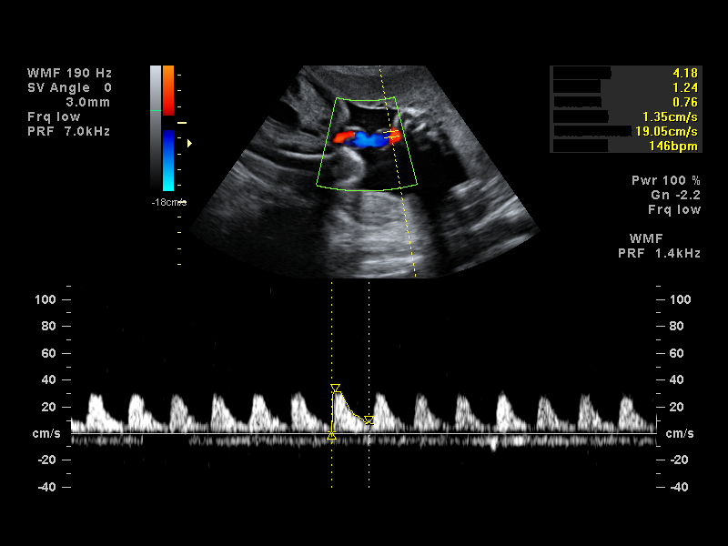
[im 14/29]
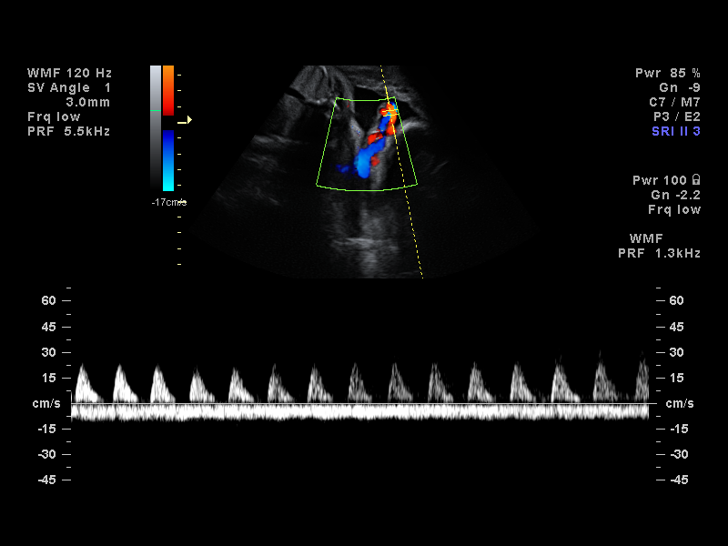
[im 16/29]
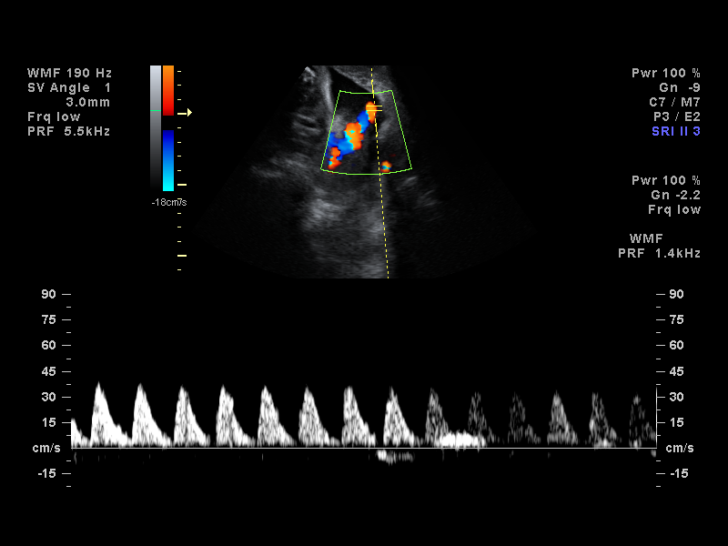
[im 18/29]
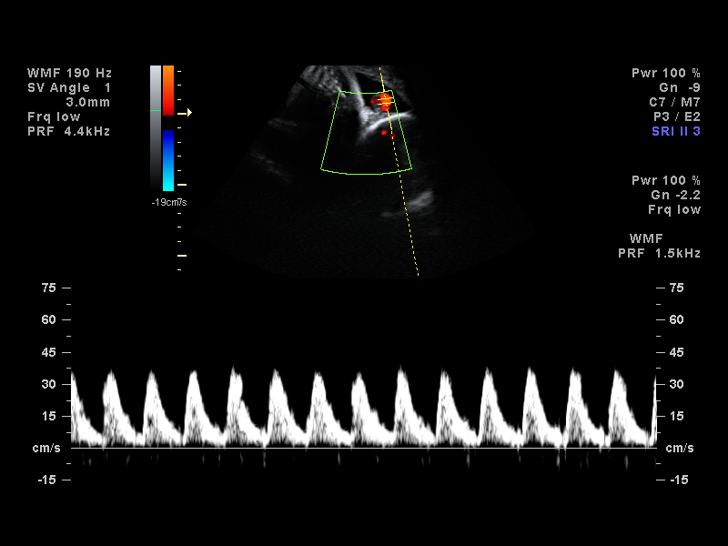
[im 20/29]
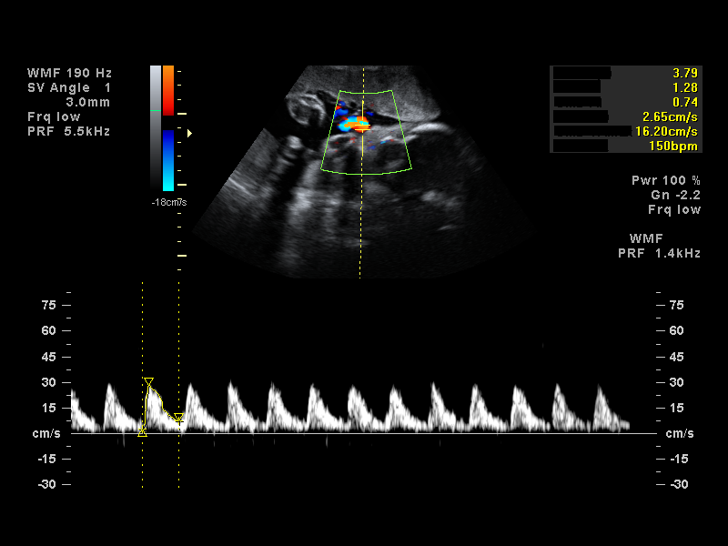
[im 22/29]
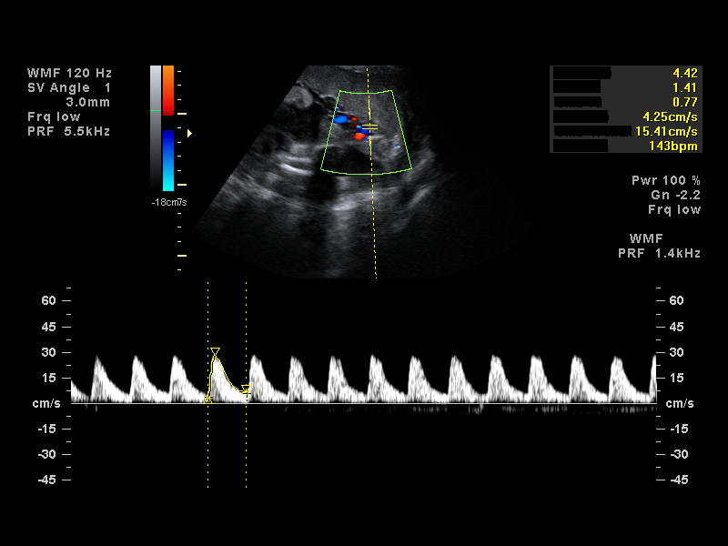
[im 24/29]
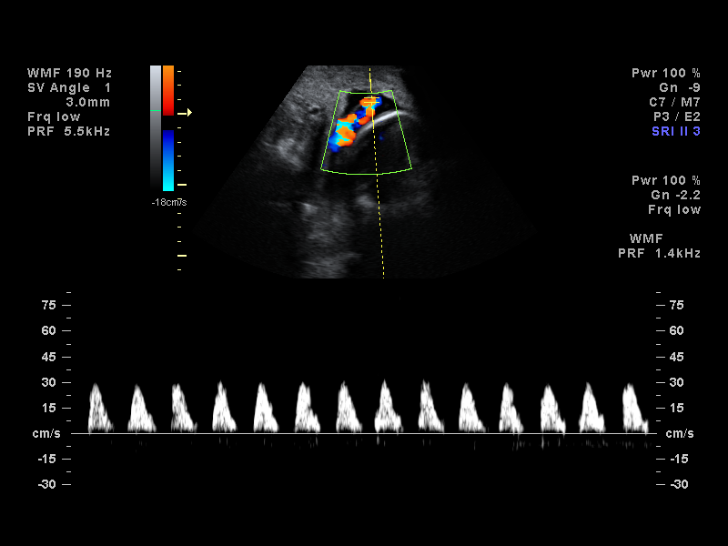
[im 26/29]
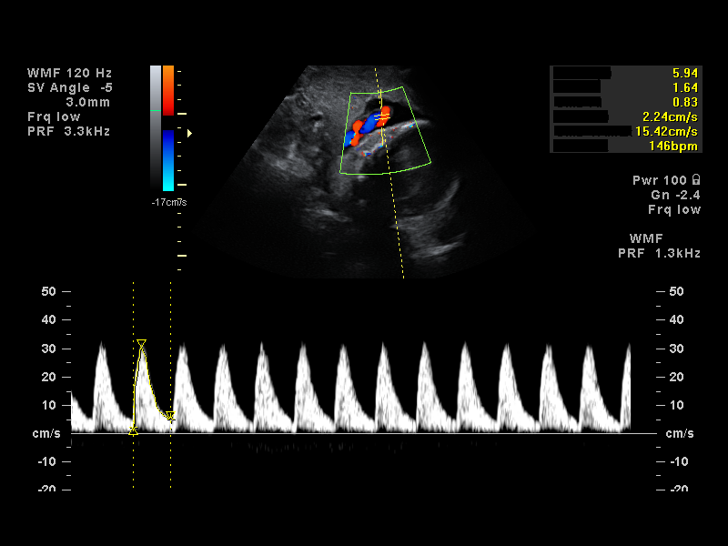
[im 29/29]
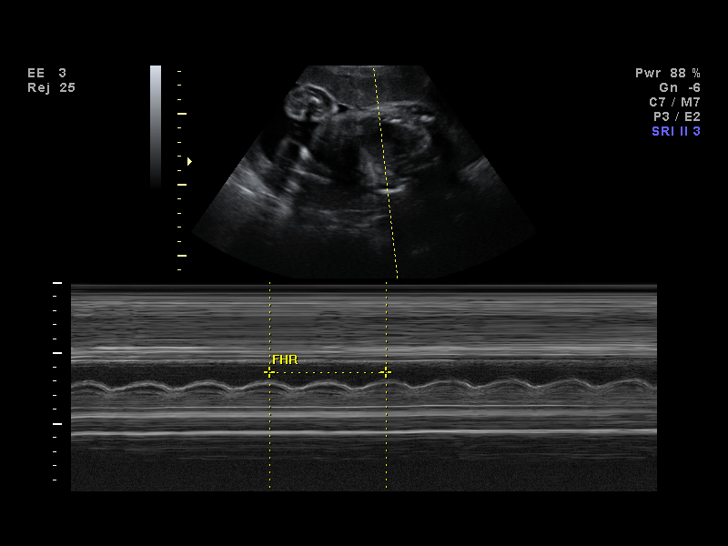

[14 of 28 positions shown; findings below may reference images not displayed]

IMPRESSION: [facility] has also been faxed to the ordering physician.

## 2007-12-23 ENCOUNTER — Encounter: Payer: Self-pay | Admitting: *Deleted

## 2007-12-23 IMAGING — US US UA DOPPLER RE-EVAL
1 series · 9 of 9 positions shown · non-contrast
Comparison: none

OBSTETRICAL ULTRASOUND:
 This ultrasound was performed in The [HOSPITAL], and the AS OB/GYN report will be stored to [REDACTED] PACS.

[Series 1: us ua doppler re-eval · 9 of 9 slices shown]
[im 1/9]
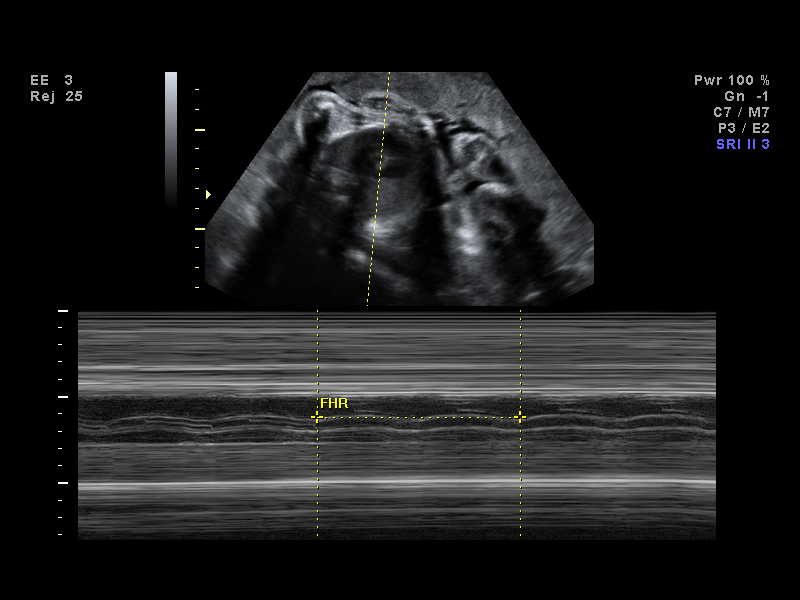
[im 2/9]
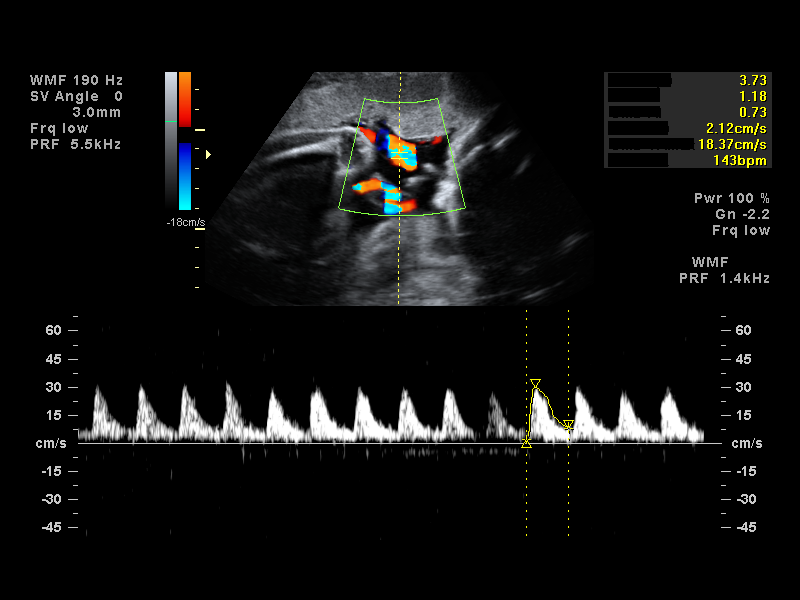
[im 3/9]
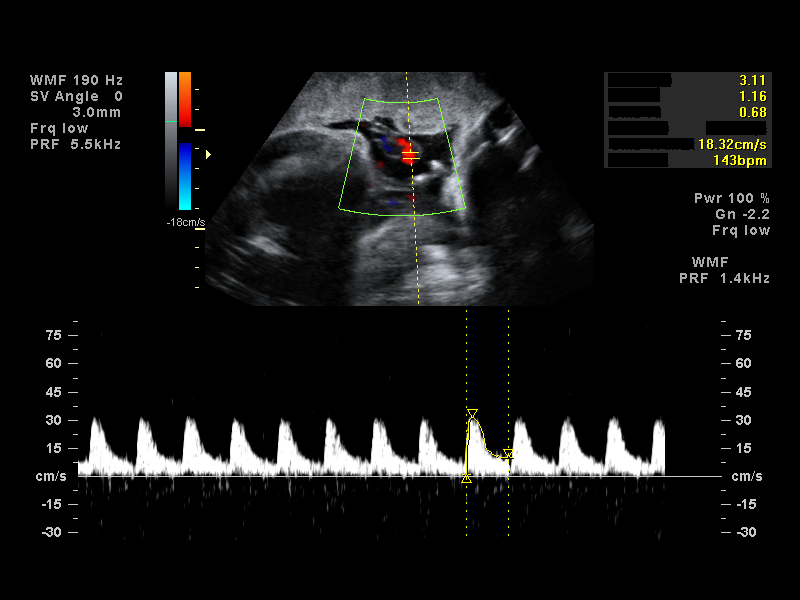
[im 4/9]
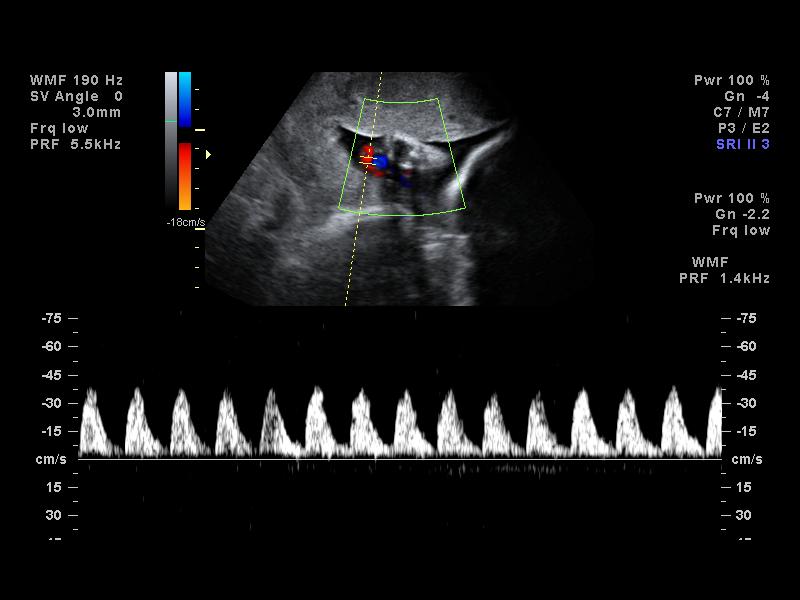
[im 5/9]
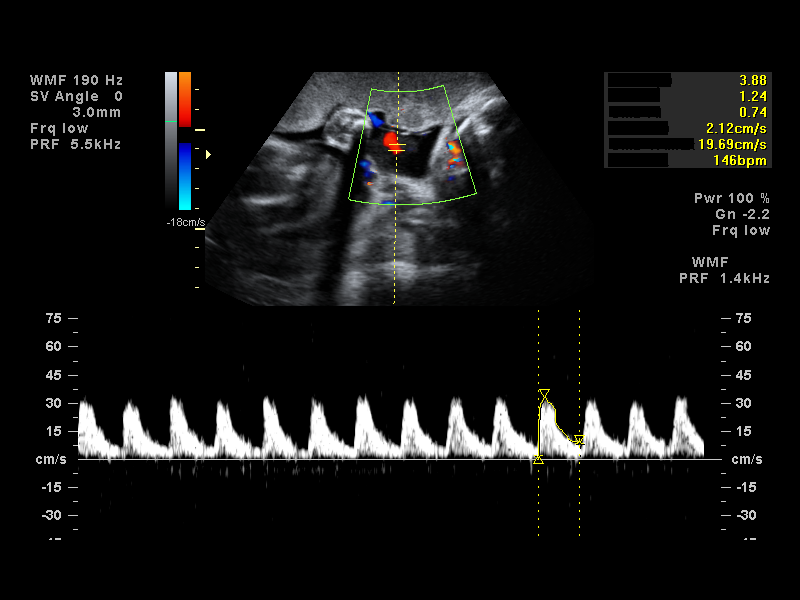
[im 6/9]
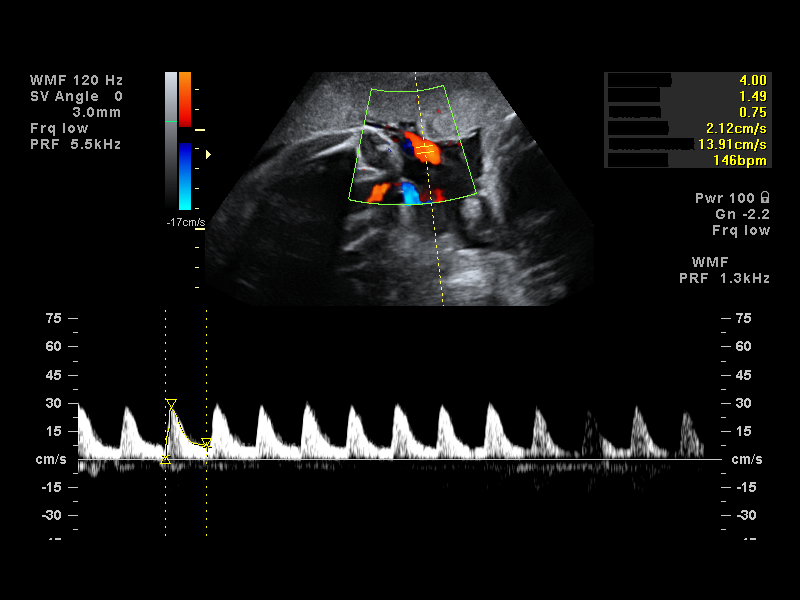
[im 7/9]
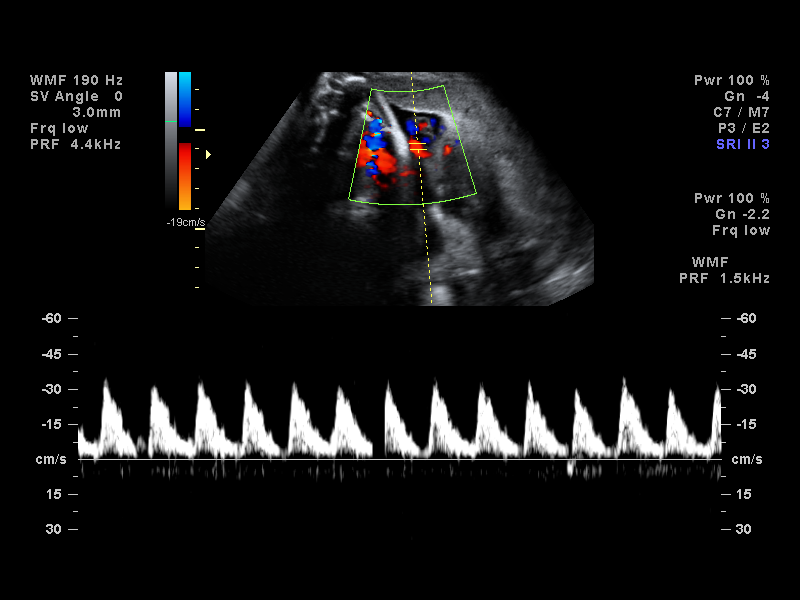
[im 8/9]
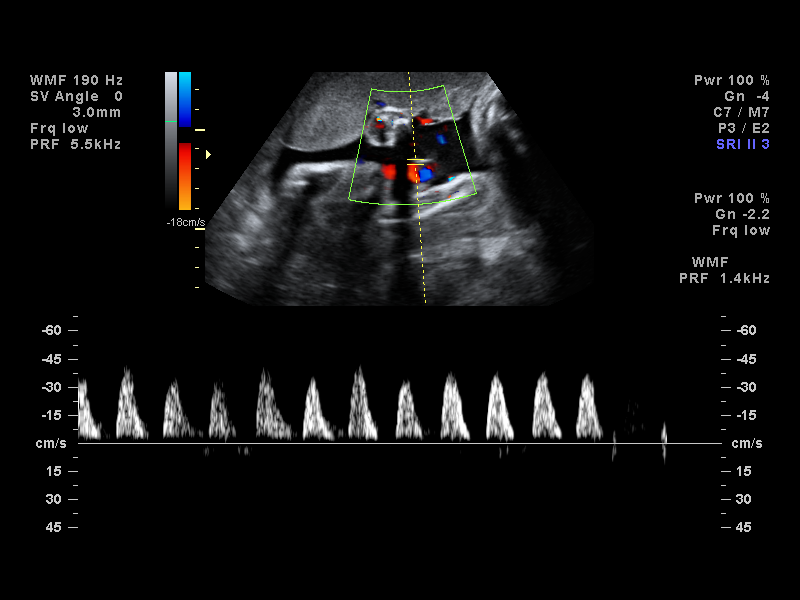
[im 9/9]
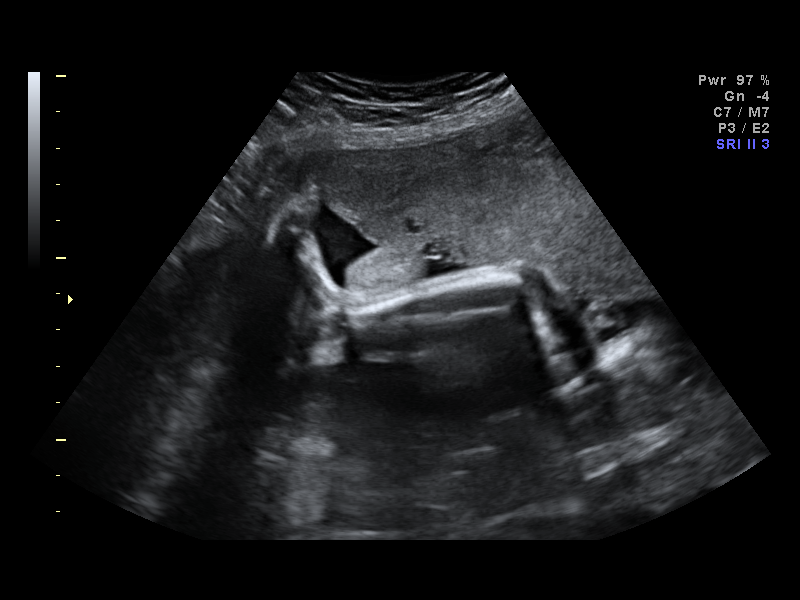

[9 of 9 positions shown; findings below may reference images not displayed]

IMPRESSION: AS OB/GYN has also been faxed to the ordering physician.

## 2007-12-24 IMAGING — US US UA DOPPLER RE-EVAL
1 series · 14 of 19 positions shown · non-contrast
Comparison: none

OBSTETRICAL ULTRASOUND:
 This ultrasound exam was performed in the [HOSPITAL] Ultrasound Department.  The OB US report was generated in the AS system, and faxed to the ordering physician.  This report is also available in [REDACTED] PACS.

[Series 1: us ua doppler re-eval · 19 acquisitions, 14 frames shown]
[im 1/19]
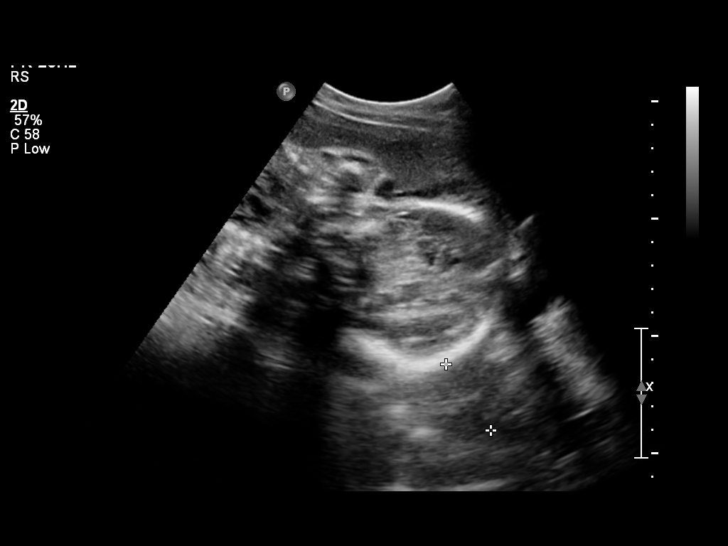
[im 3/19]
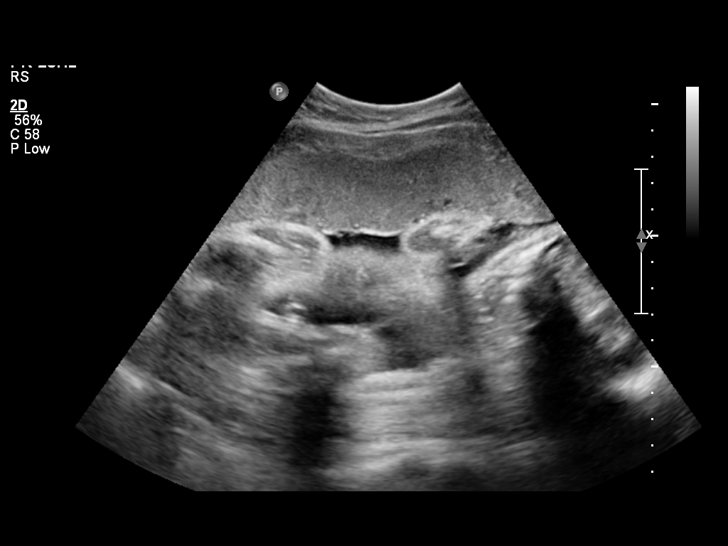
[im 4/19]
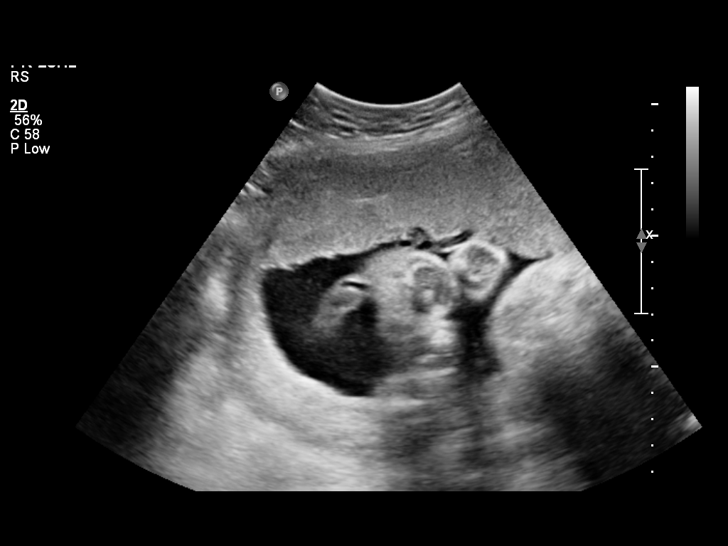
[im 5/19]
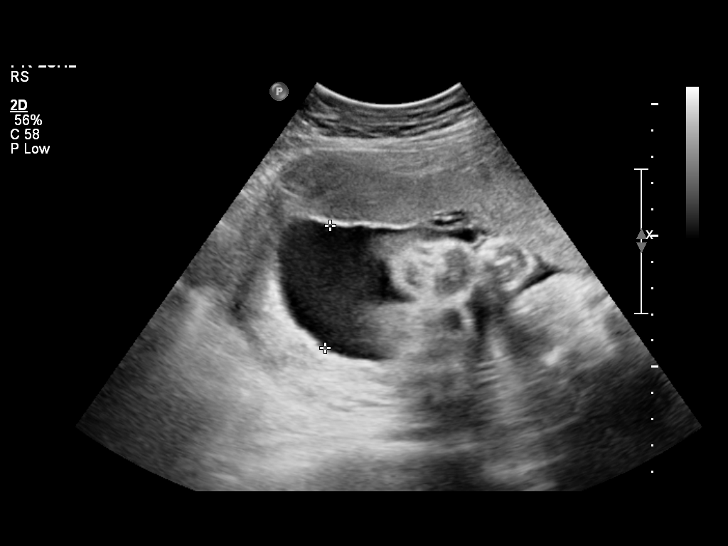
[im 7/19]
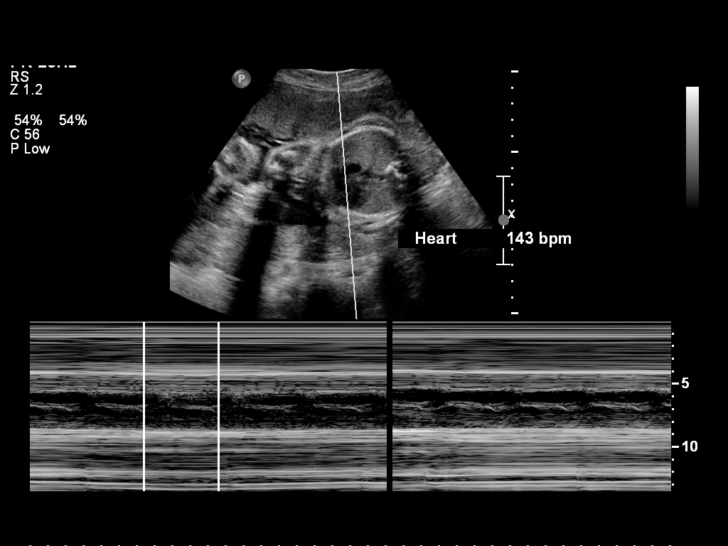
[im 8/19]
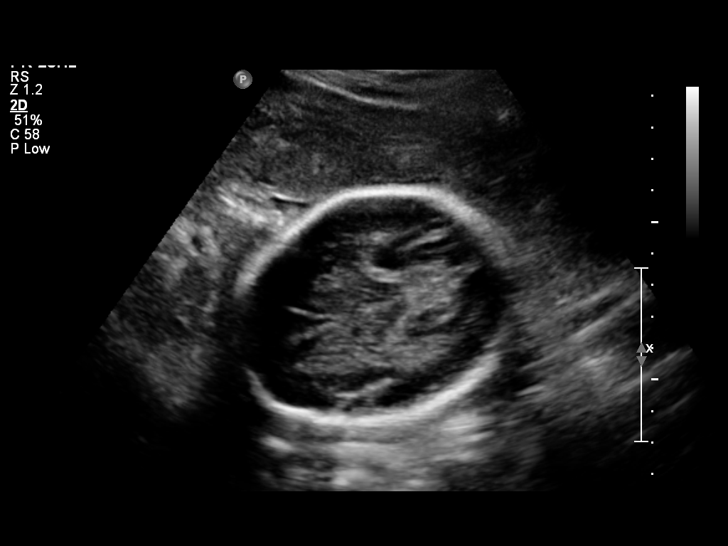
[im 9/19]
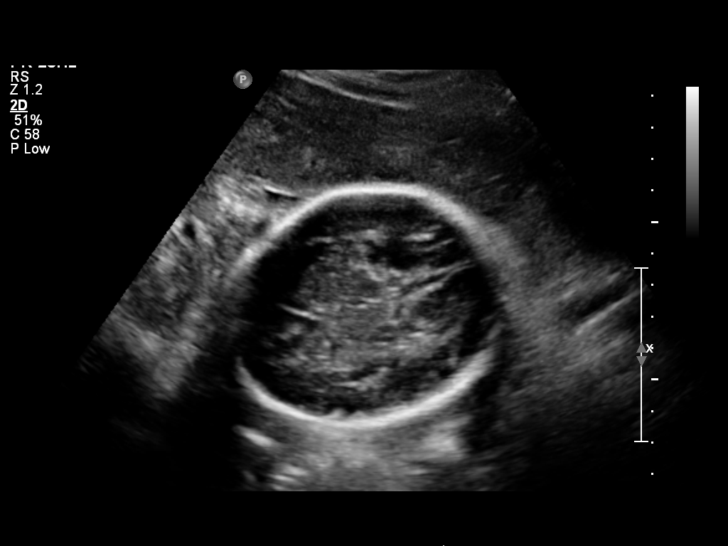
[im 11/19]
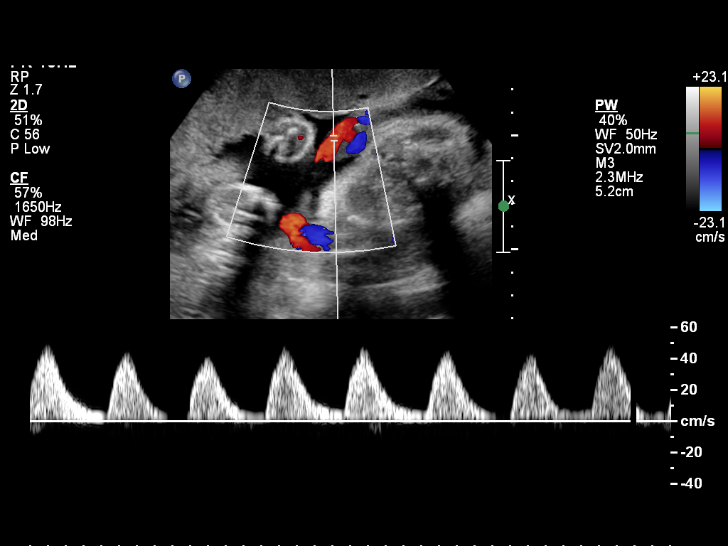
[im 12/19]
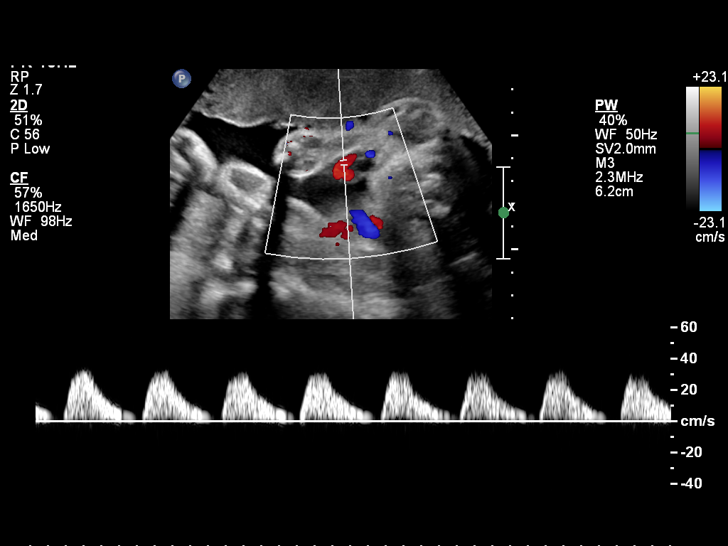
[im 13/19]
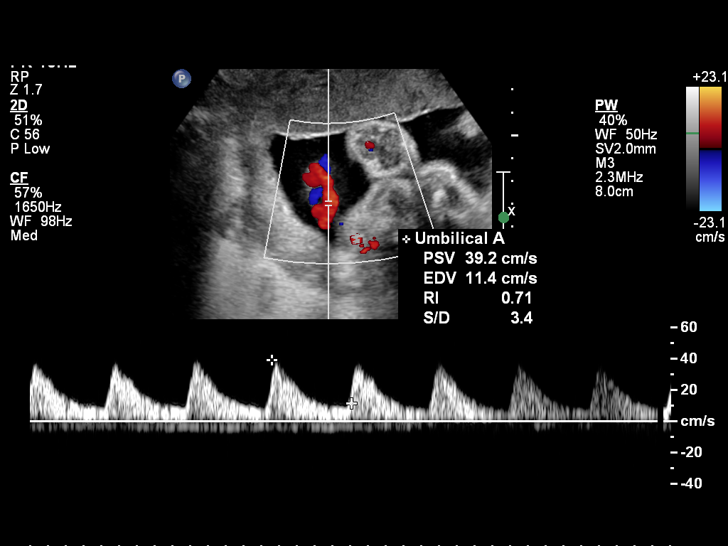
[im 15/19]
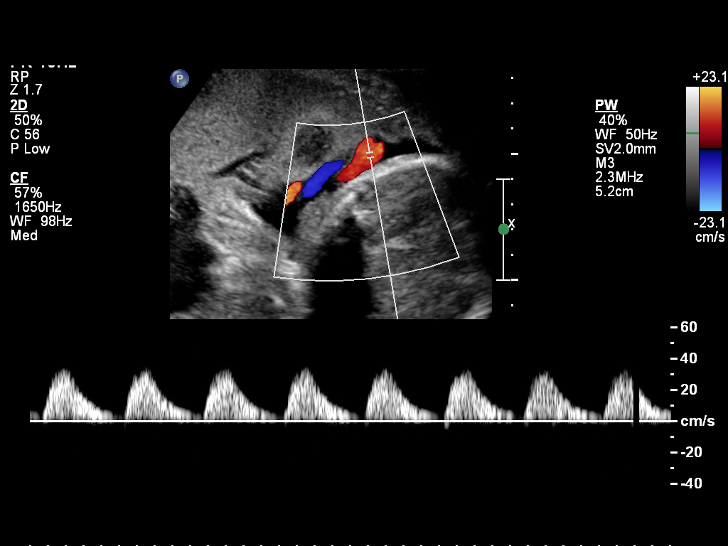
[im 16/19]
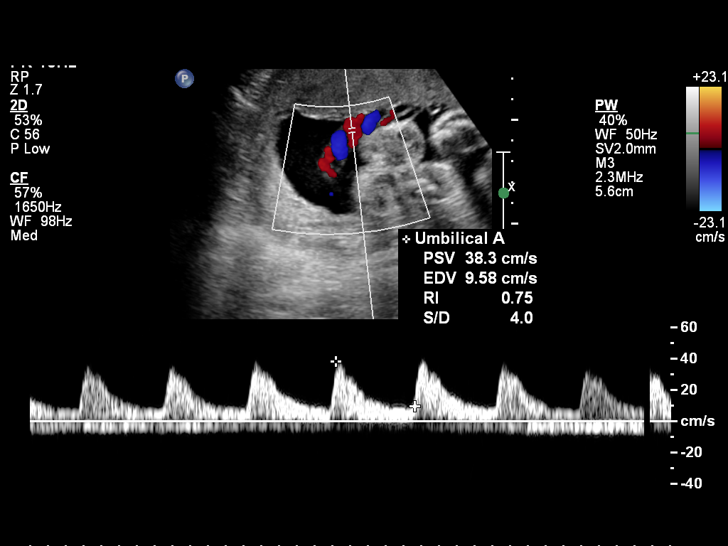
[im 17/19]
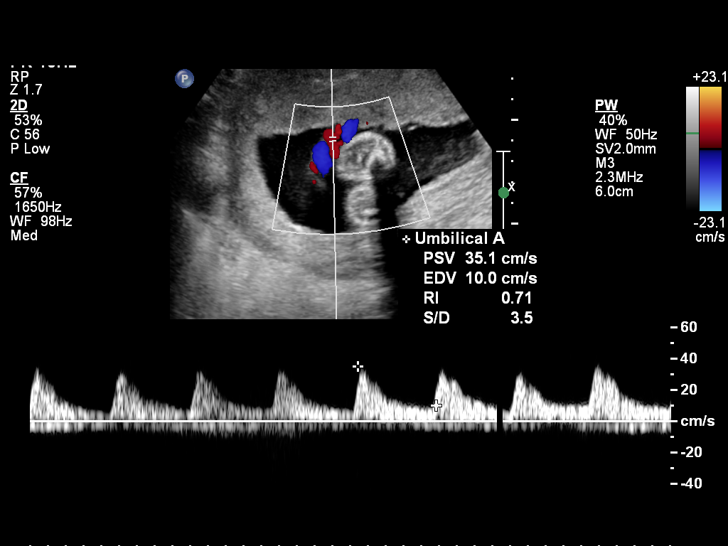
[im 19/19]
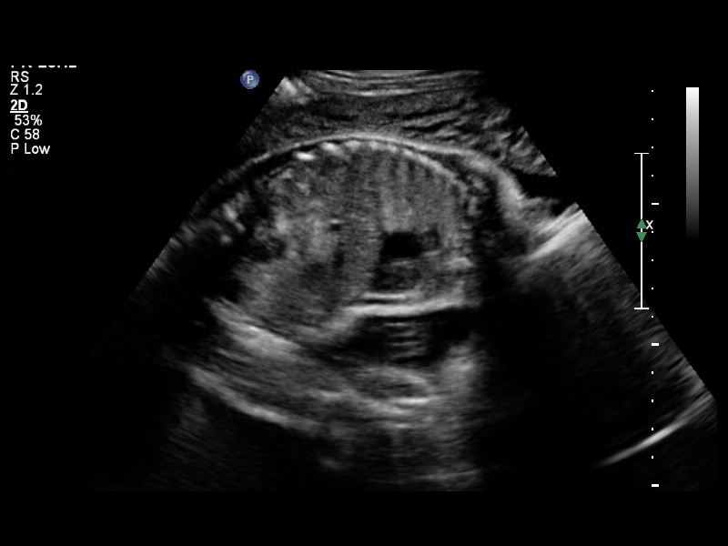

[14 of 19 positions shown; findings below may reference images not displayed]

IMPRESSION: See AS Obstetric US report.

## 2007-12-25 IMAGING — US US UA DOPPLER RE-EVAL
1 series · 14 of 17 positions shown · non-contrast
Comparison: none

OBSTETRICAL ULTRASOUND:
 This ultrasound exam was performed in the [HOSPITAL] Ultrasound Department.  The OB US report was generated in the AS system, and faxed to the ordering physician.  This report is also available in [REDACTED] PACS.

[Series 1: us ua doppler re-eval · 14 of 17 slices shown]
[im 1/17]
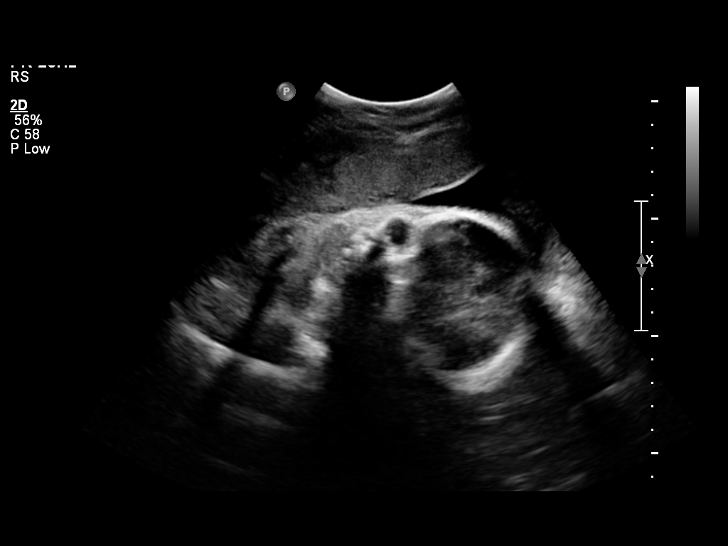
[im 2/17]
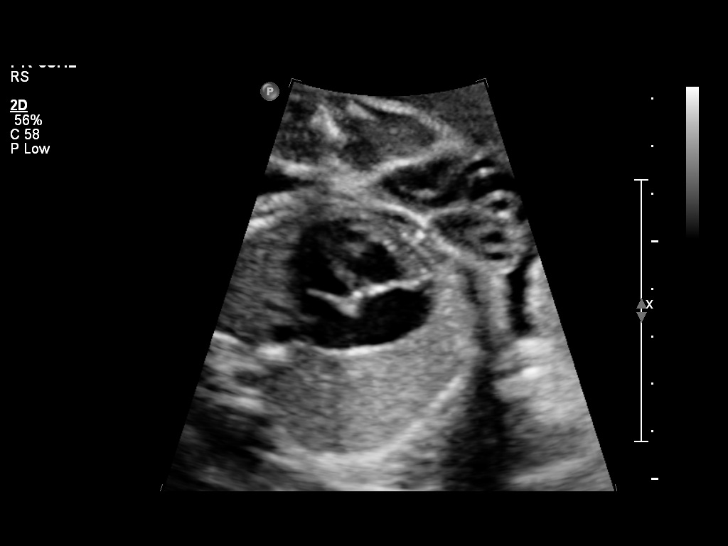
[im 4/17]
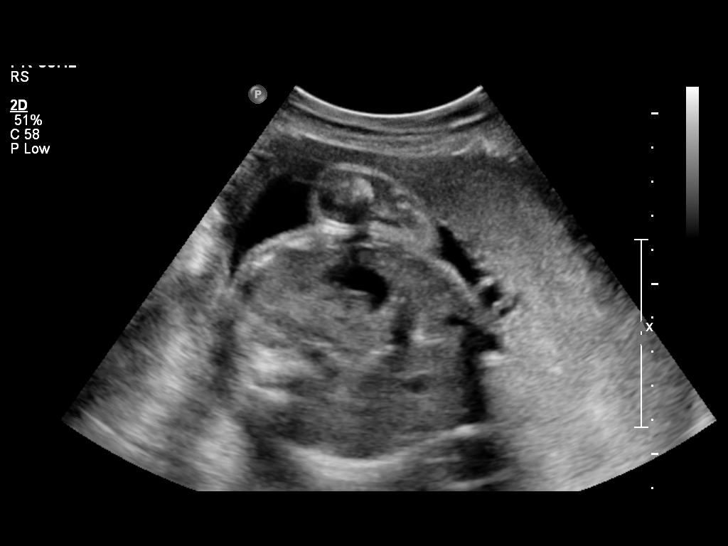
[im 5/17]
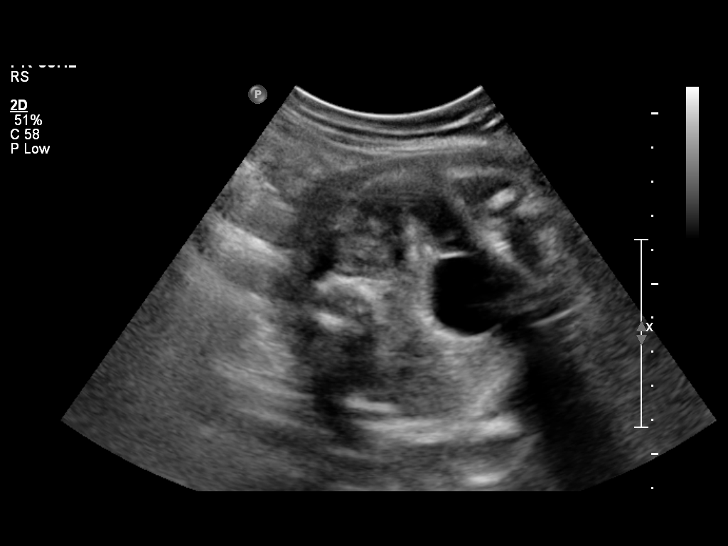
[im 6/17]
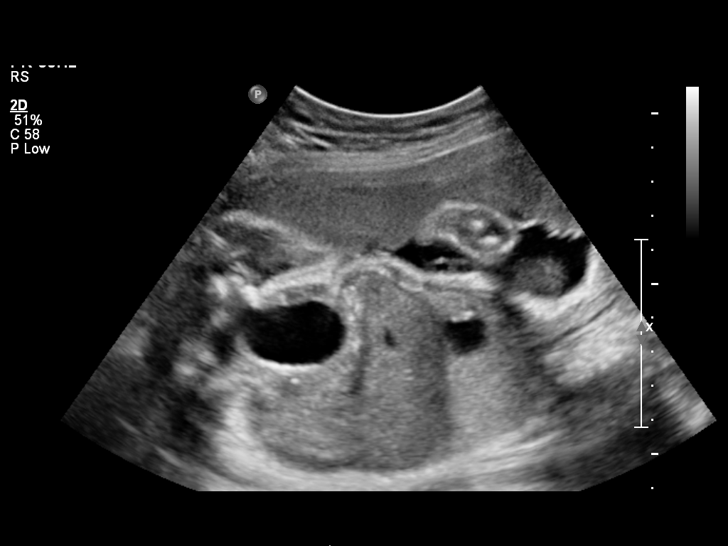
[im 7/17]
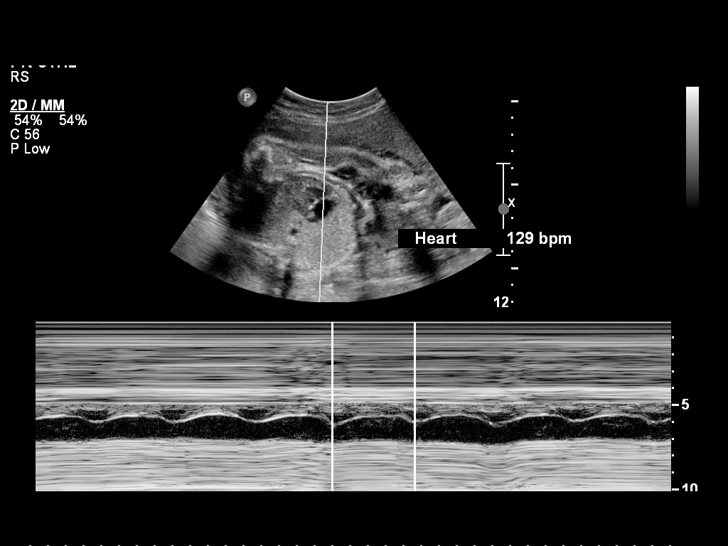
[im 8/17]
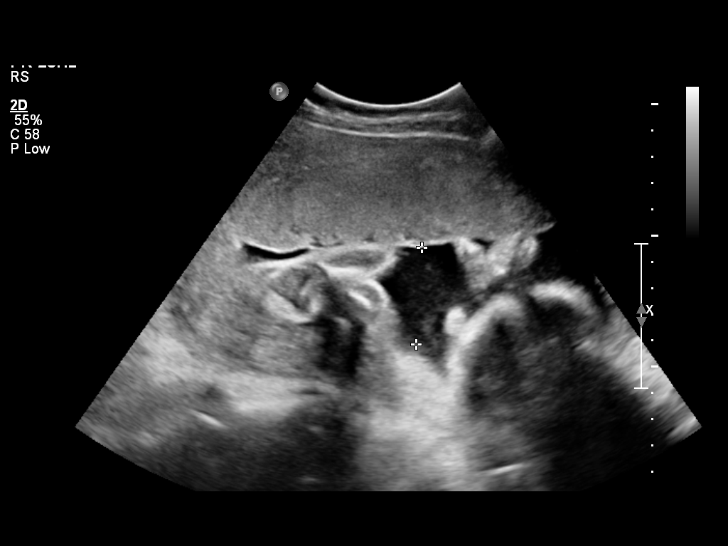
[im 10/17]
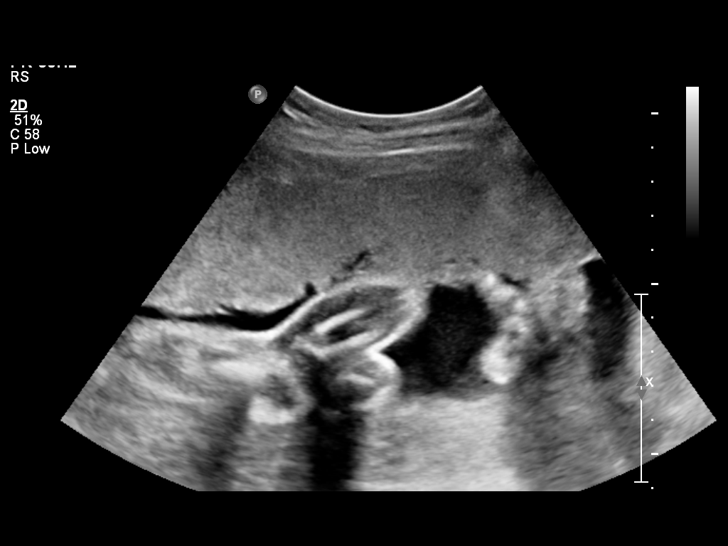
[im 11/17]
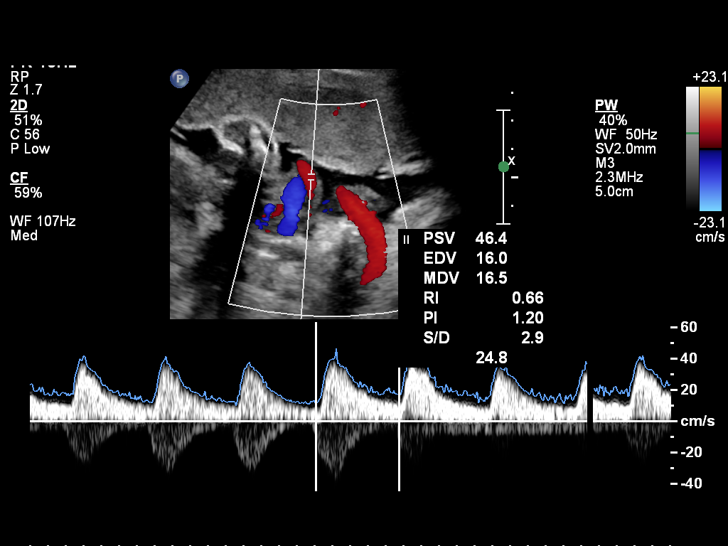
[im 12/17]
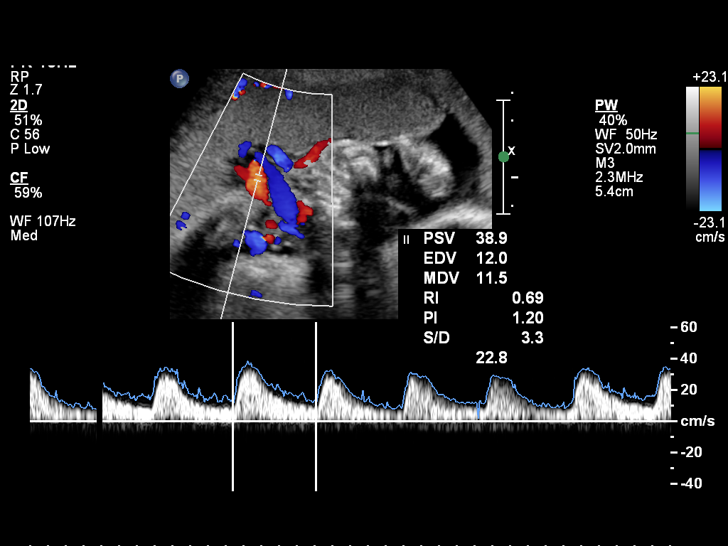
[im 13/17]
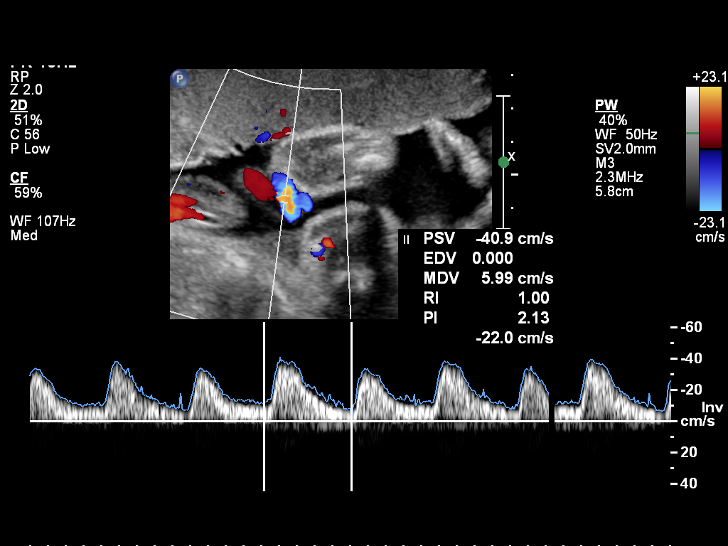
[im 14/17]
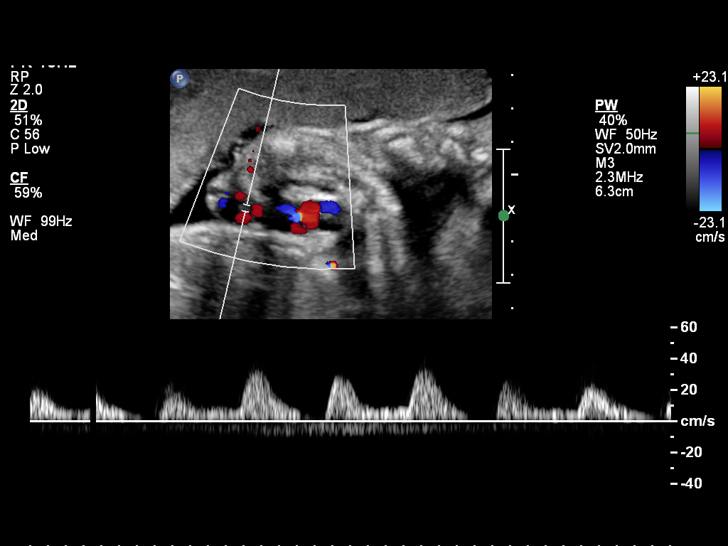
[im 16/17]
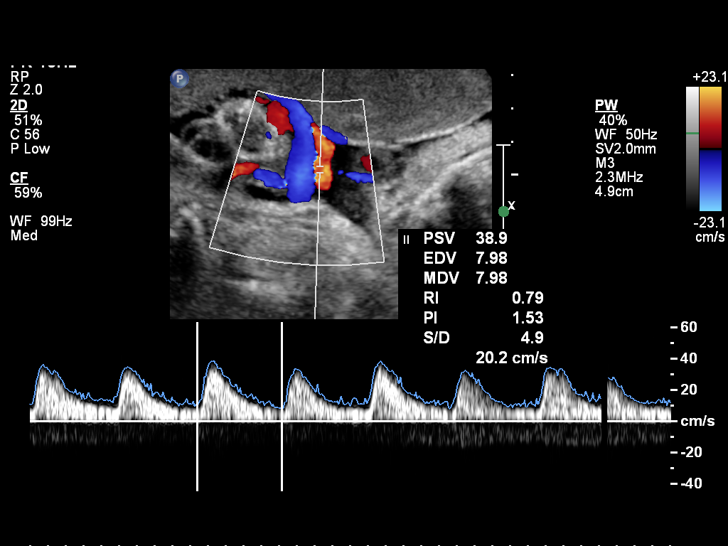
[im 17/17]
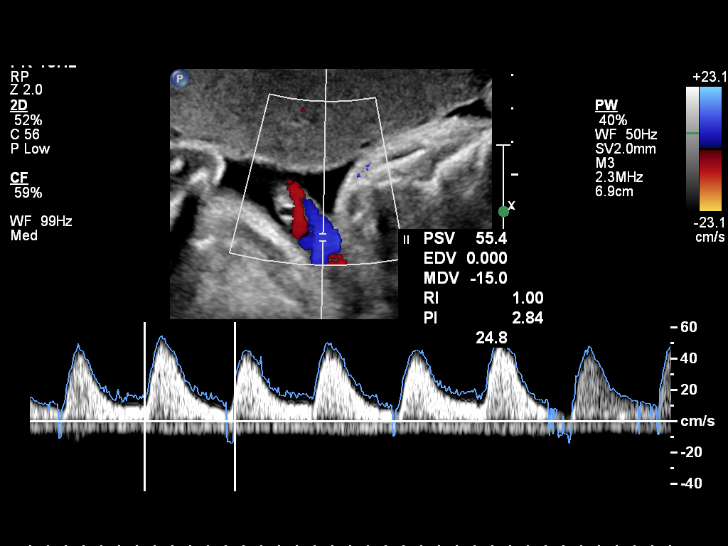

[14 of 17 positions shown; findings below may reference images not displayed]

IMPRESSION: See AS Obstetric US report.

## 2007-12-26 ENCOUNTER — Encounter: Payer: Self-pay | Admitting: *Deleted

## 2007-12-26 IMAGING — US US UA DOPPLER RE-EVAL
1 series · 14 of 26 positions shown · non-contrast
Comparison: none

OBSTETRICAL ULTRASOUND:
 This ultrasound was performed in The [HOSPITAL], and the AS OB/GYN report will be stored to [REDACTED] PACS.

[Series 1: us ua doppler re-eval · 14 of 26 slices shown]
[im 1/26]
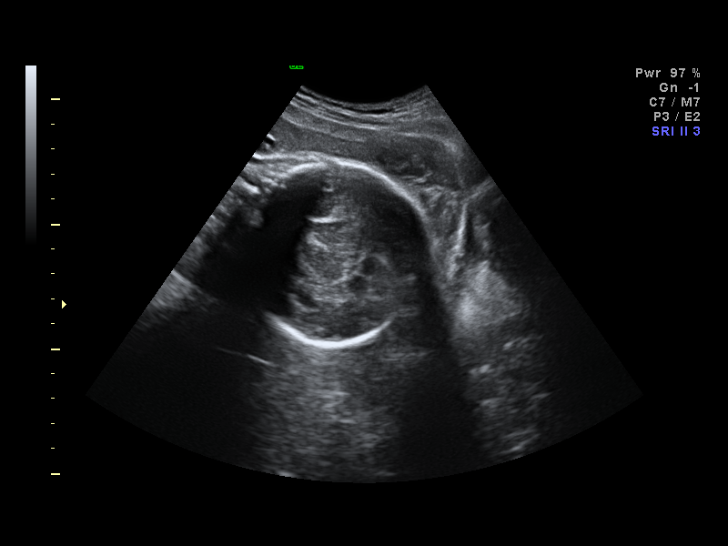
[im 3/26]
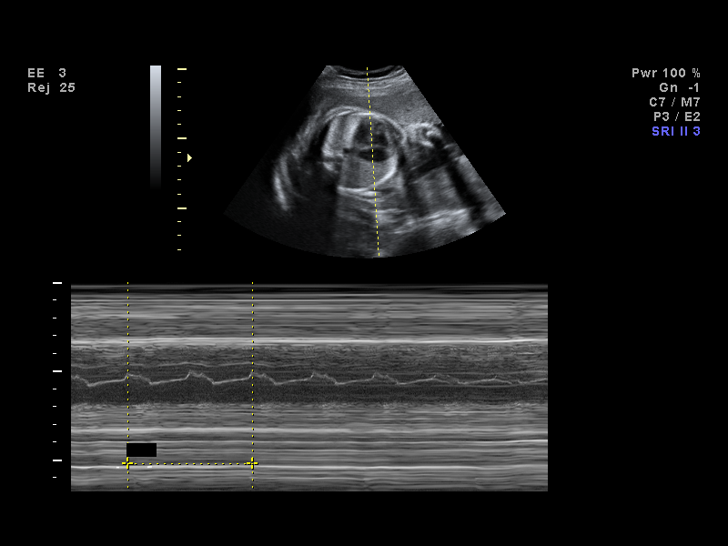
[im 5/26]
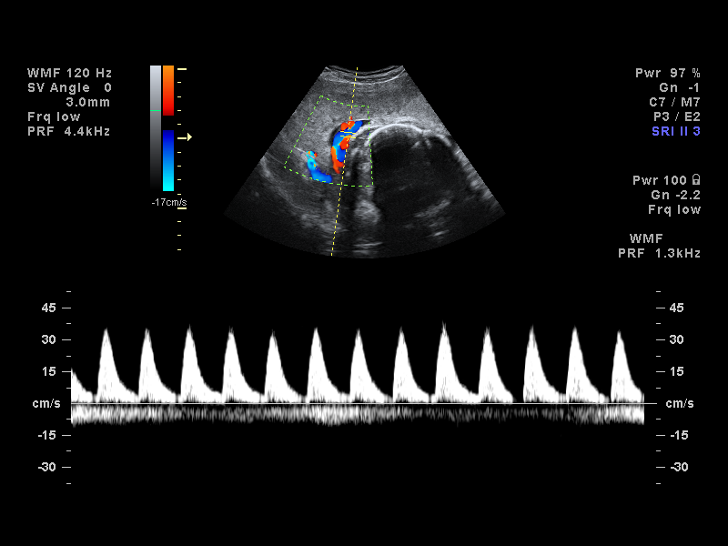
[im 7/26]
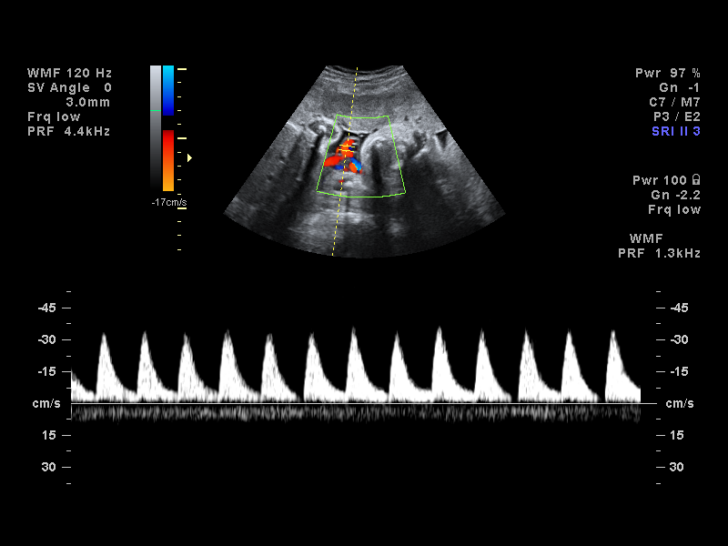
[im 9/26]
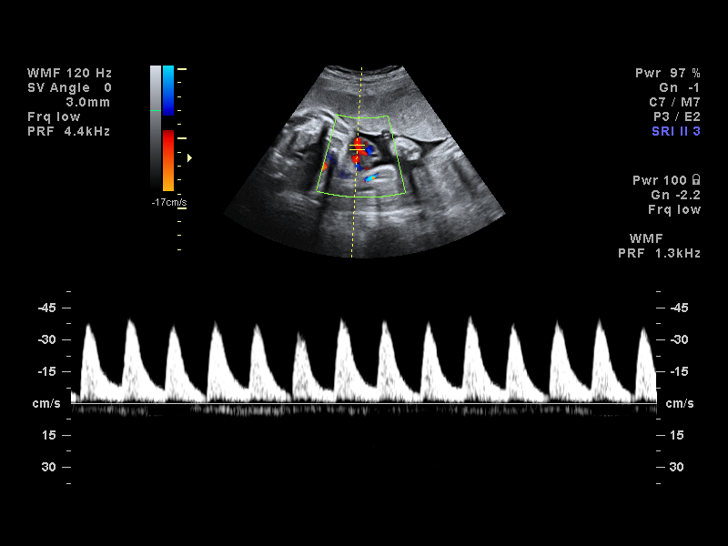
[im 11/26]
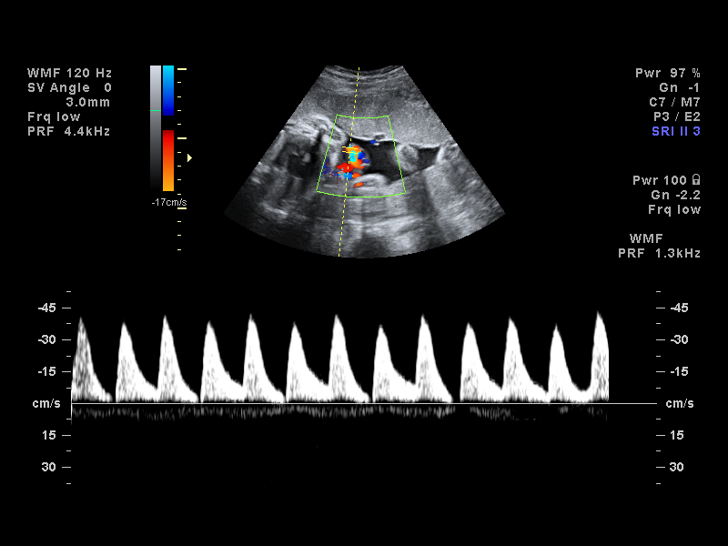
[im 13/26]
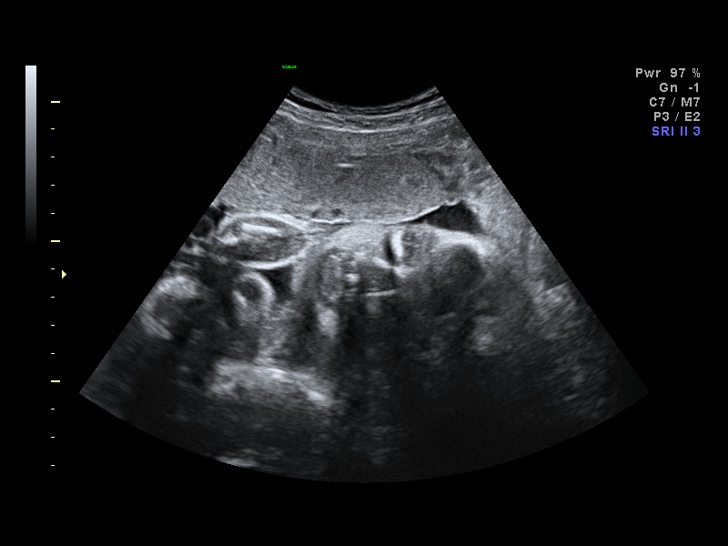
[im 14/26]
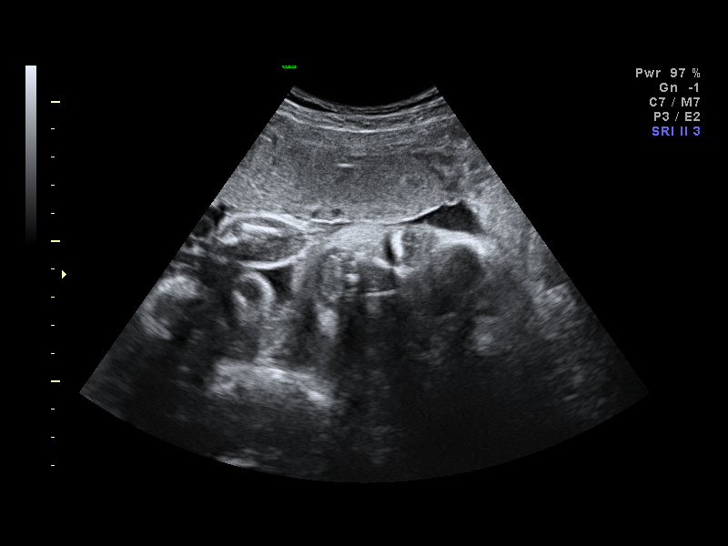
[im 16/26]
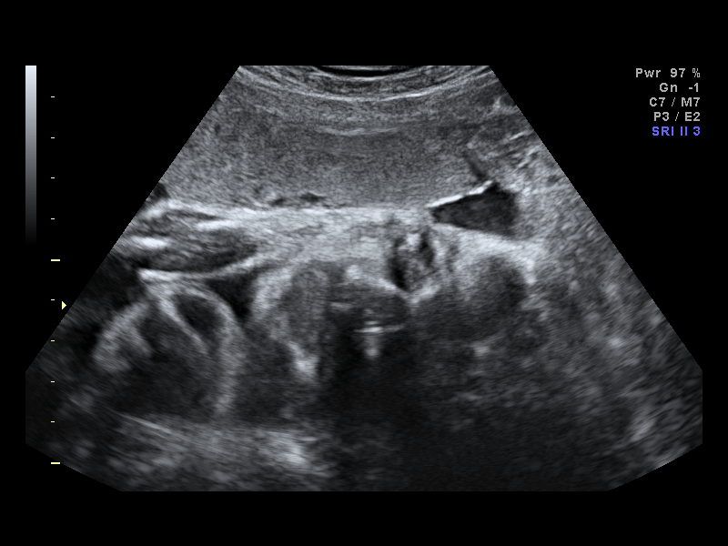
[im 18/26]
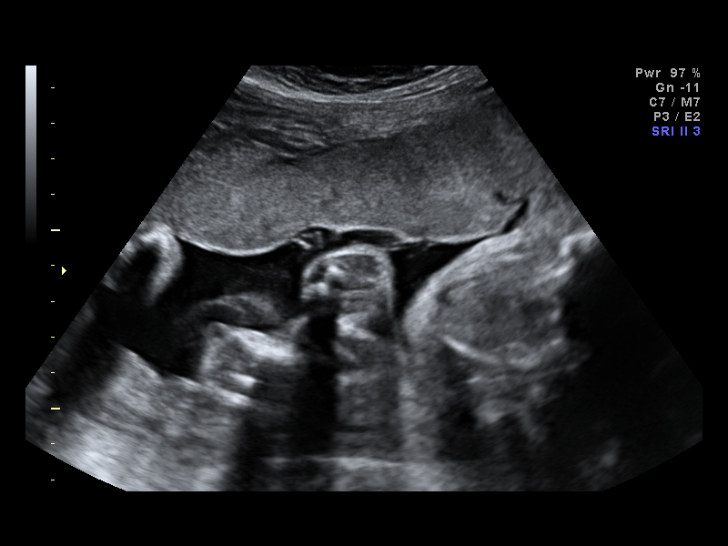
[im 20/26]
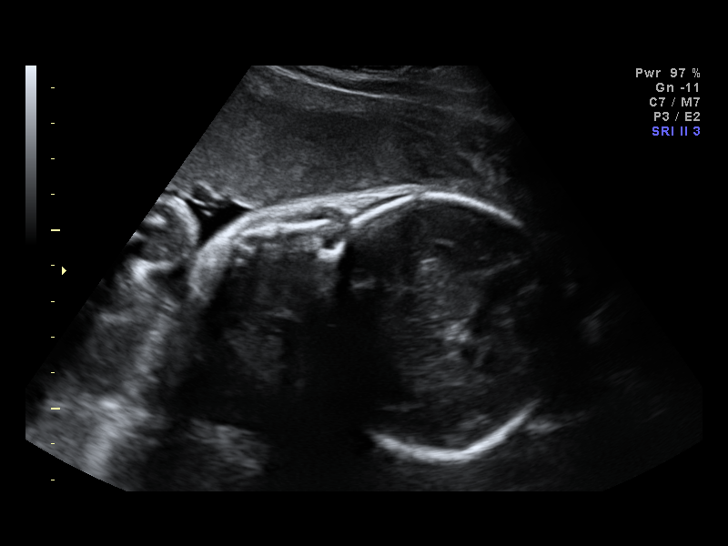
[im 22/26]
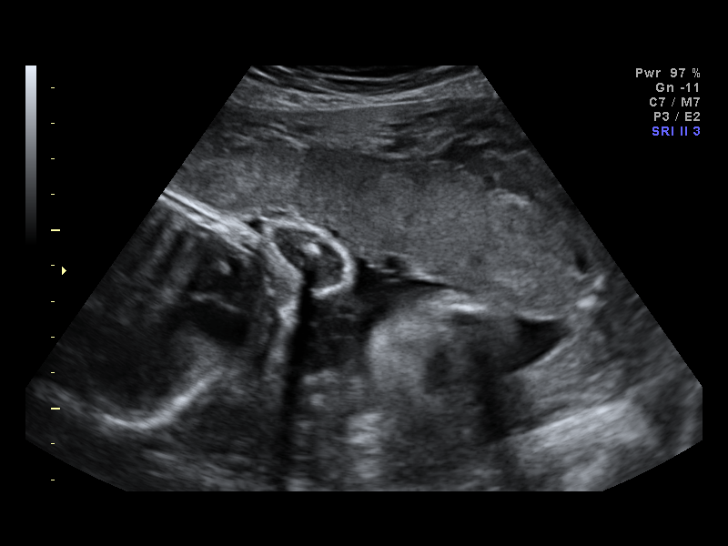
[im 24/26]
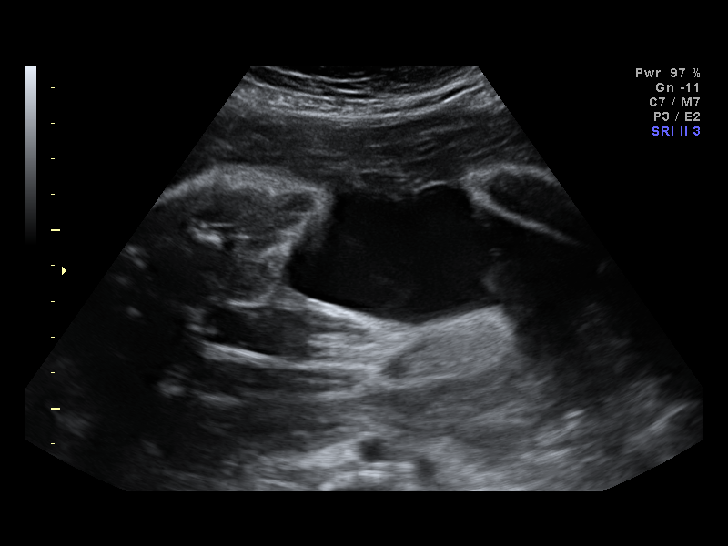
[im 26/26]
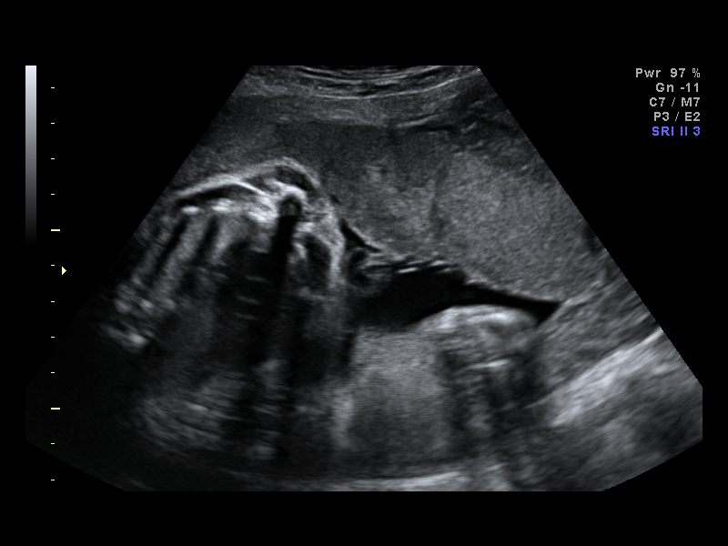

[14 of 26 positions shown; findings below may reference images not displayed]

IMPRESSION: AS OB/GYN has also been faxed to the ordering physician.

## 2007-12-27 ENCOUNTER — Encounter: Payer: Self-pay | Admitting: *Deleted

## 2007-12-27 IMAGING — US US UA DOPPLER RE-EVAL
1 series · 11 of 11 positions shown · non-contrast
Comparison: none

OBSTETRICAL ULTRASOUND:
 This ultrasound was performed in The [HOSPITAL], and the AS OB/GYN report will be stored to [REDACTED] PACS.

[Series 1: us ua doppler re-eval · 11 of 11 slices shown]
[im 1/11]
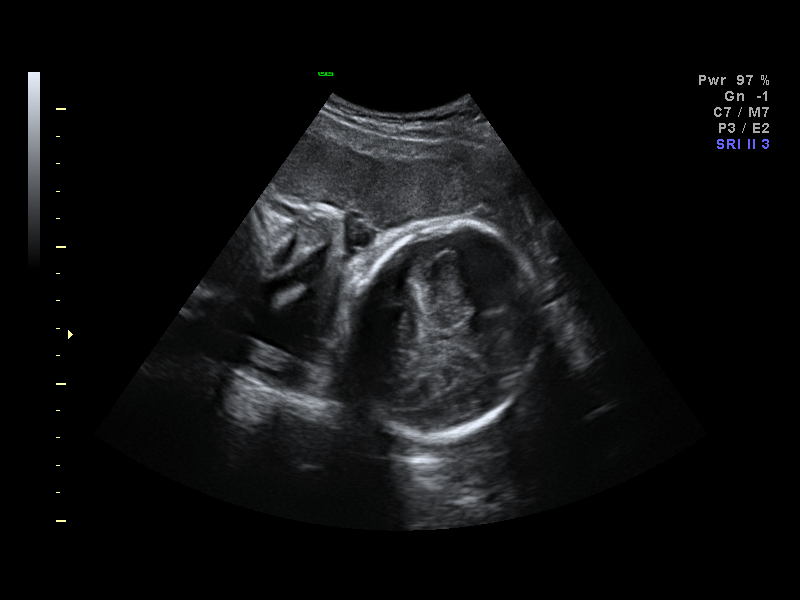
[im 2/11]
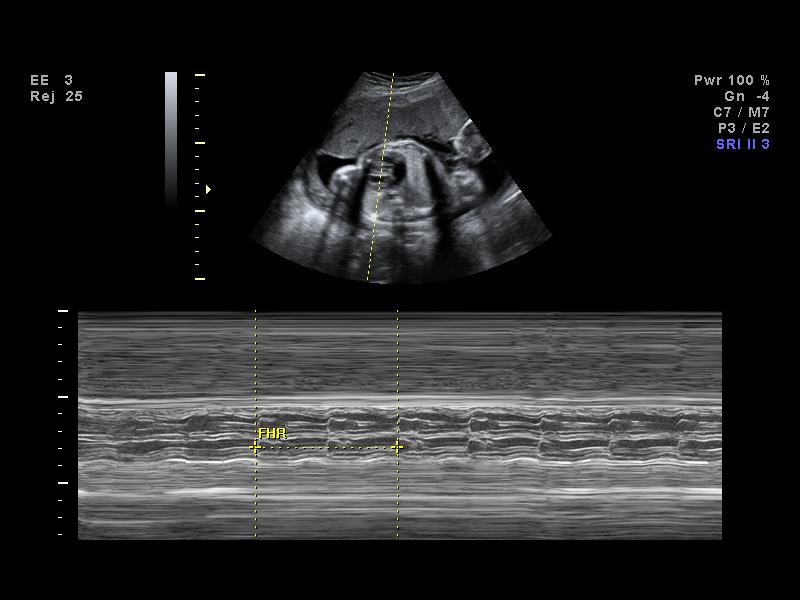
[im 3/11]
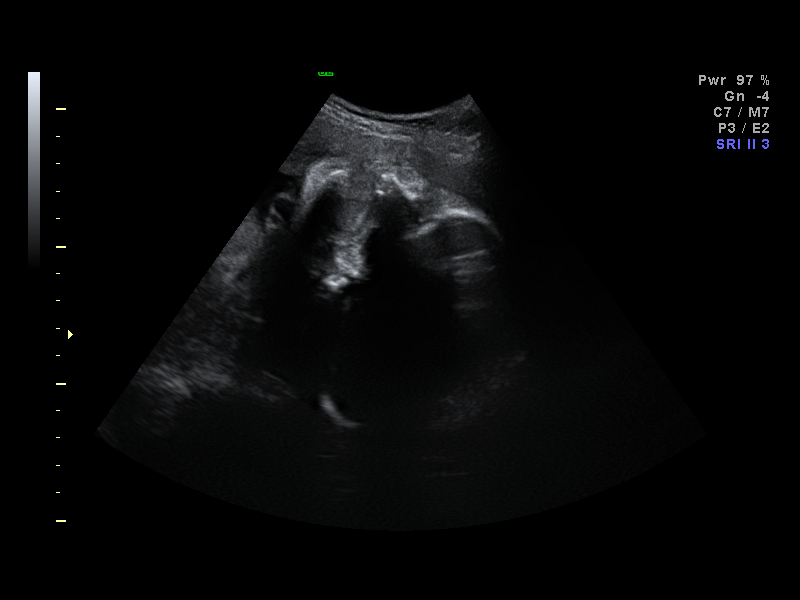
[im 4/11]
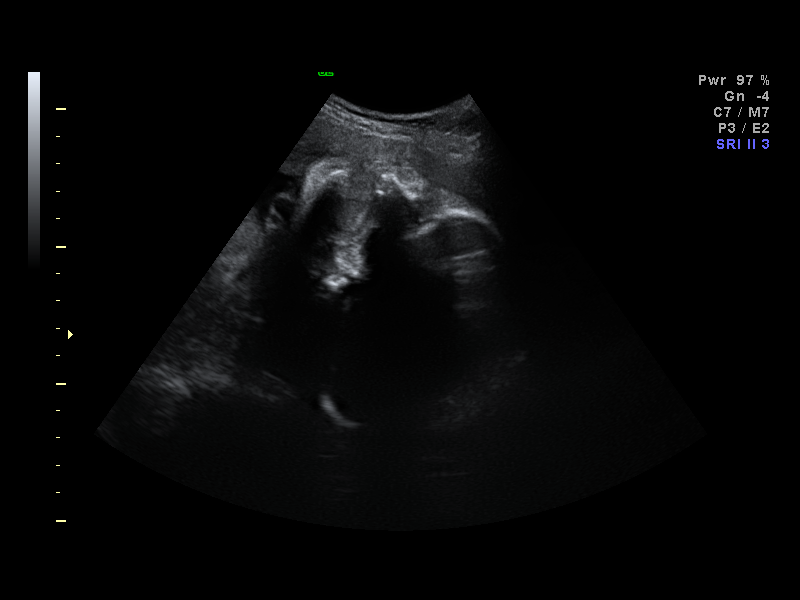
[im 5/11]
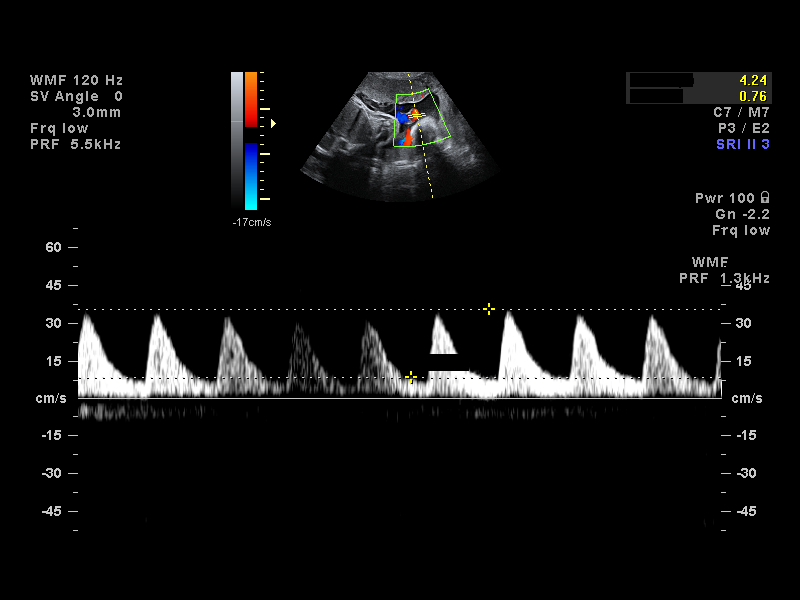
[im 6/11]
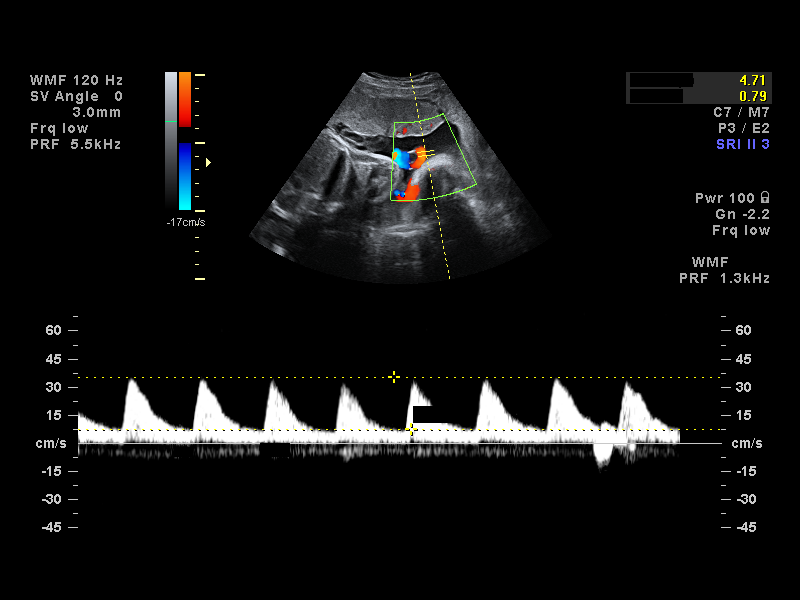
[im 7/11]
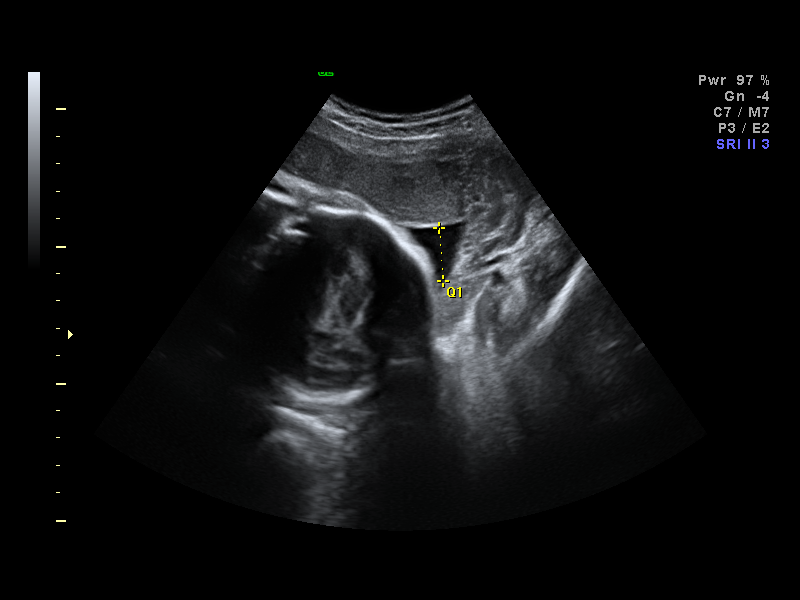
[im 8/11]
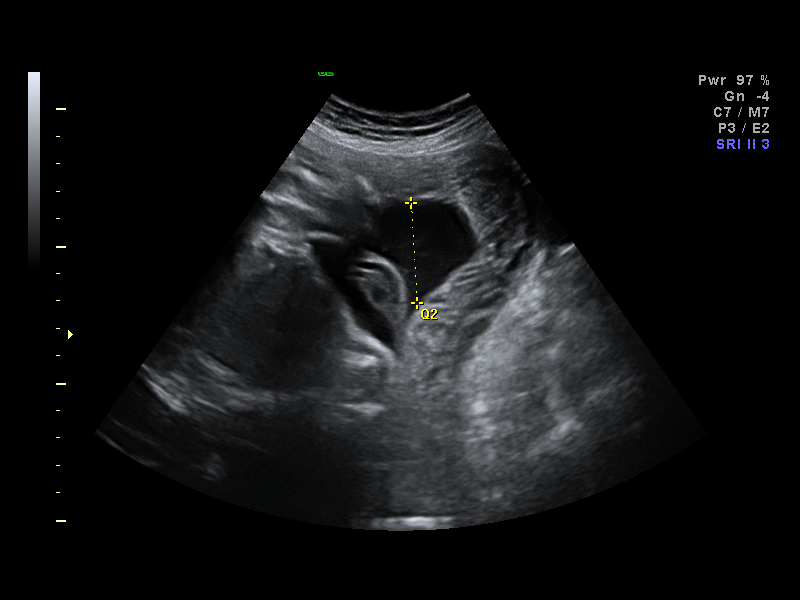
[im 9/11]
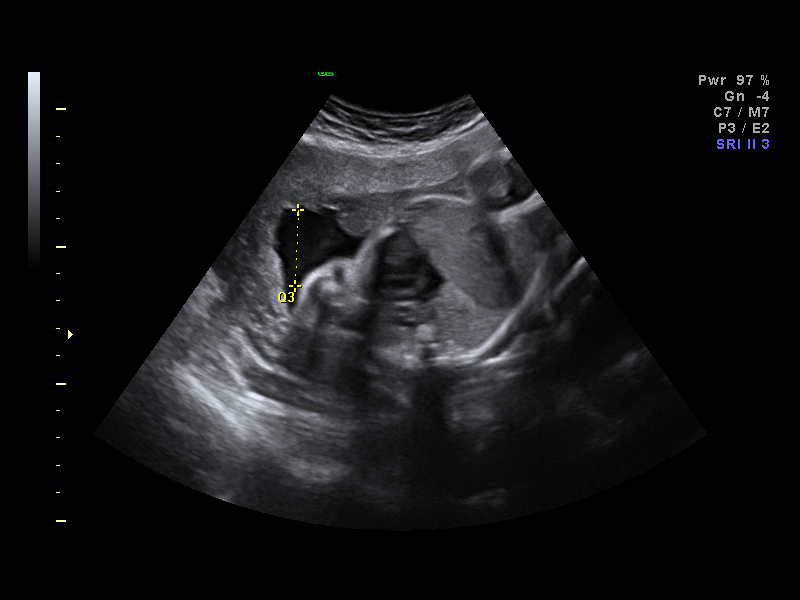
[im 10/11]
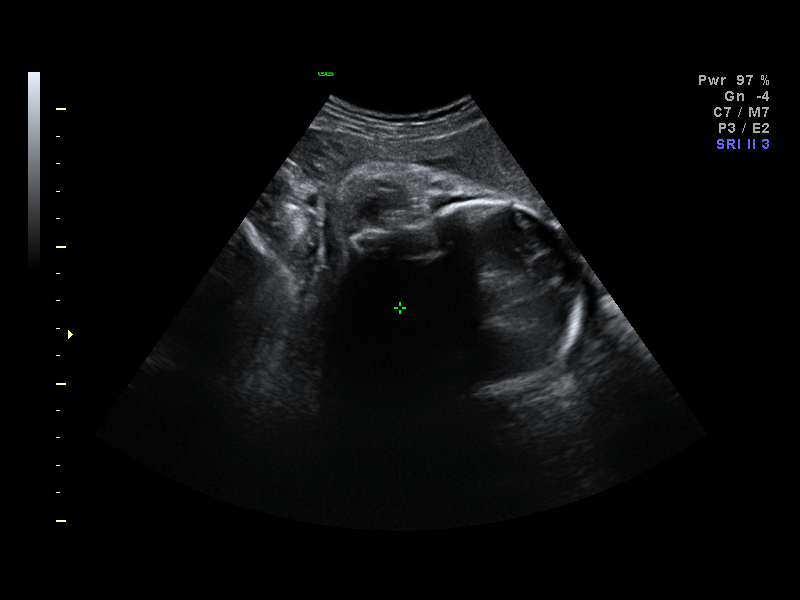
[im 11/11]
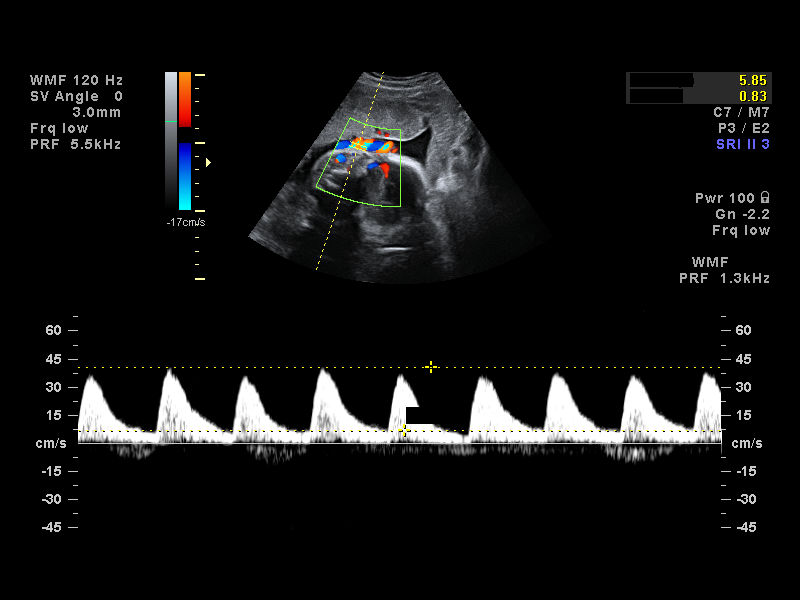

[11 of 11 positions shown; findings below may reference images not displayed]

IMPRESSION: AS OB/GYN has also been faxed to the ordering physician.

## 2007-12-28 ENCOUNTER — Encounter: Payer: Self-pay | Admitting: *Deleted

## 2007-12-28 IMAGING — US US UA DOPPLER RE-EVAL
1 series · 10 of 10 positions shown · non-contrast
Comparison: none

OBSTETRICAL ULTRASOUND:
 This ultrasound was performed in The [HOSPITAL], and the AS OB/GYN report will be stored to [REDACTED] PACS.

[Series 1: us ua doppler re-eval · 10 of 10 slices shown]
[im 1/10]
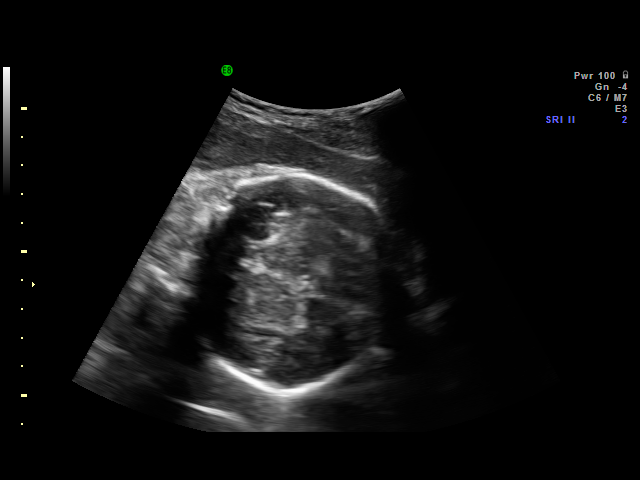
[im 2/10]
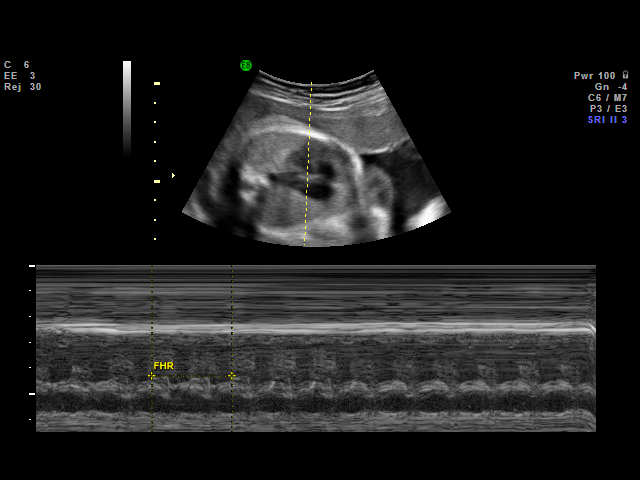
[im 3/10]
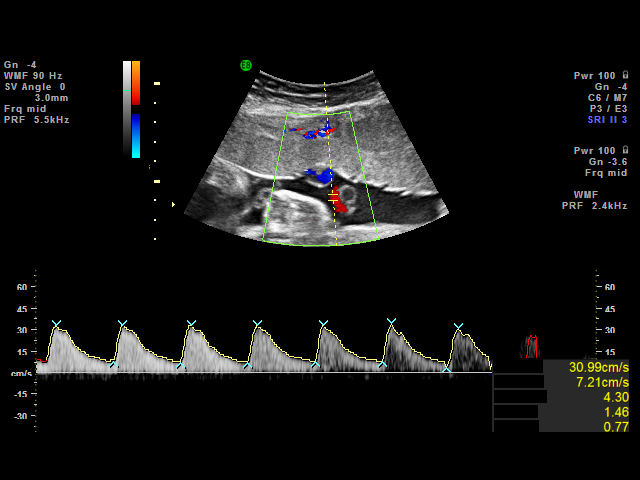
[im 4/10]
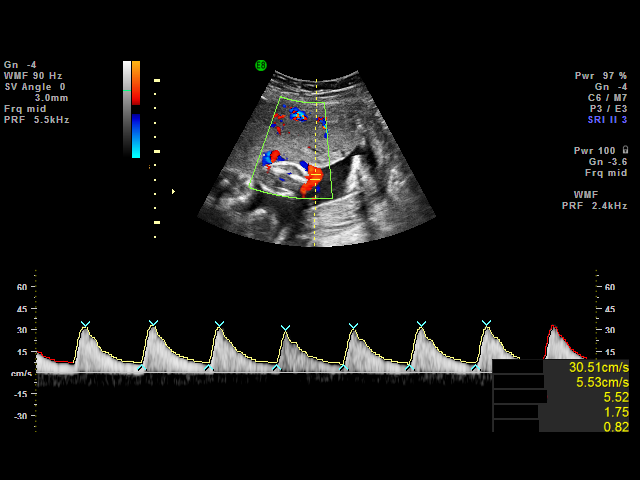
[im 5/10]
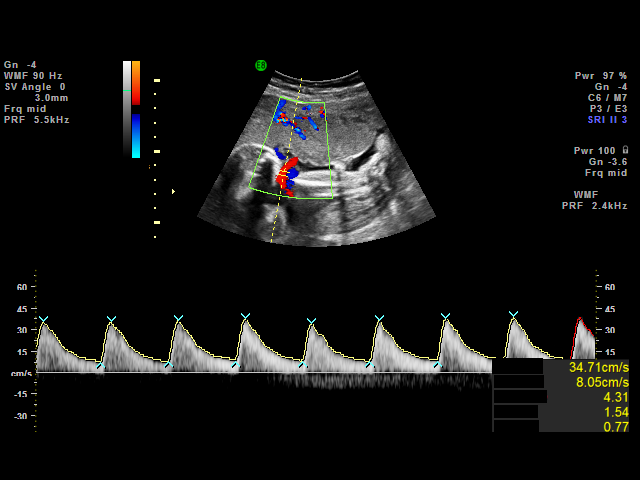
[im 6/10]
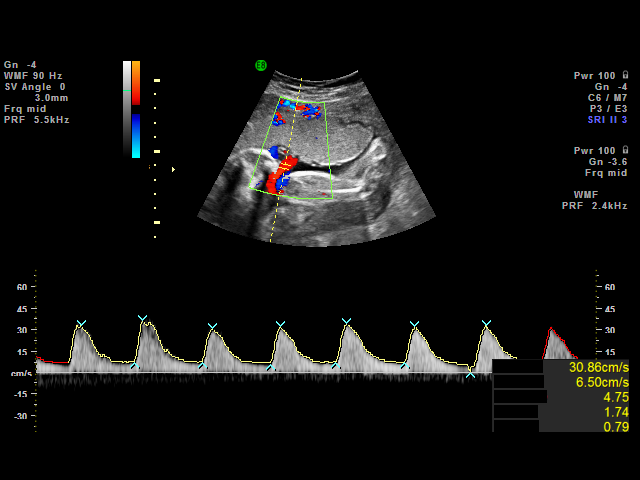
[im 7/10]
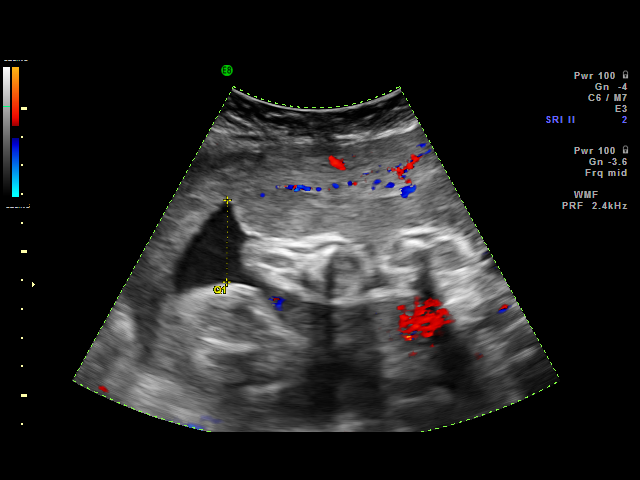
[im 8/10]
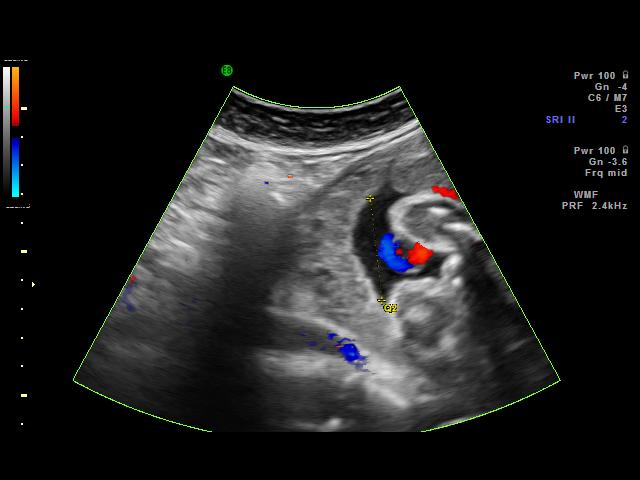
[im 9/10]
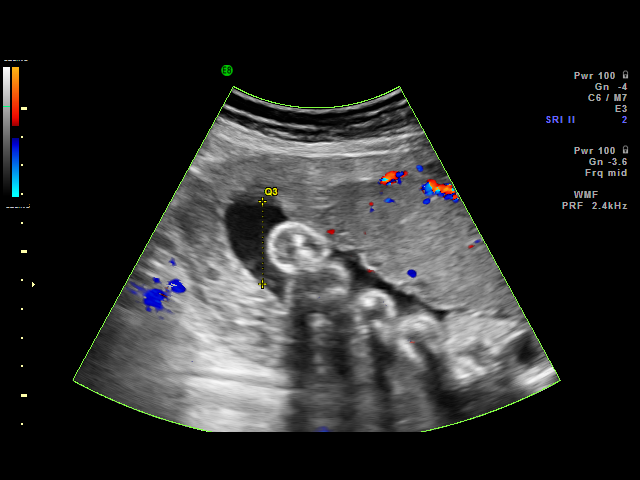
[im 10/10]
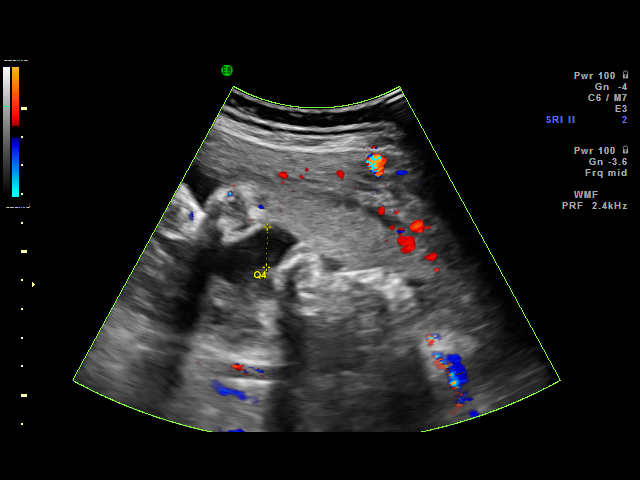

[10 of 10 positions shown; findings below may reference images not displayed]

IMPRESSION: AS OB/GYN has also been faxed to the ordering physician.

## 2007-12-29 ENCOUNTER — Encounter: Payer: Self-pay | Admitting: *Deleted

## 2007-12-29 IMAGING — US US FETAL BPP W/O NONSTRESS
1 series · 12 of 12 positions shown · non-contrast
Comparison: none

OBSTETRICAL ULTRASOUND:
 This ultrasound was performed in The [HOSPITAL], and the AS OB/GYN report will be stored to [REDACTED] PACS.

[Series 1: us fetal bpp w/o nonstress · 12 acquisitions, 12 frames shown]
[im 1/12]
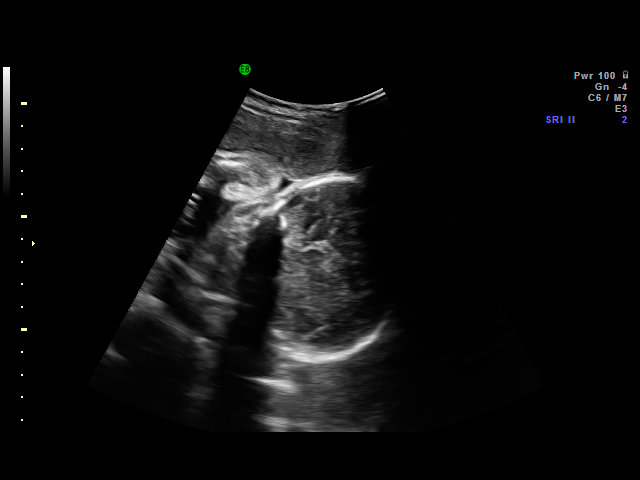
[im 2/12]
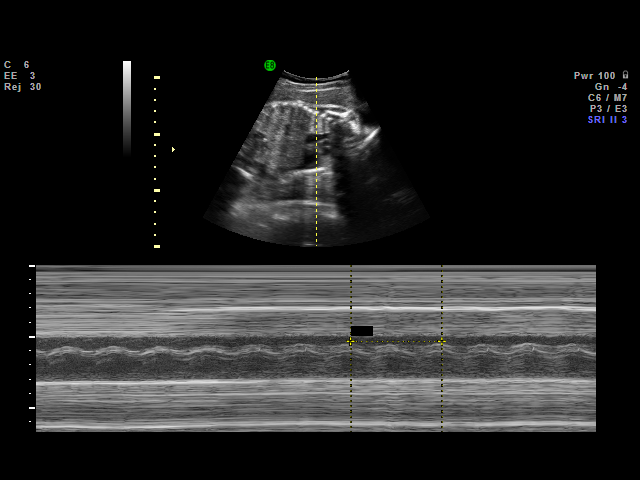
[im 3/12]
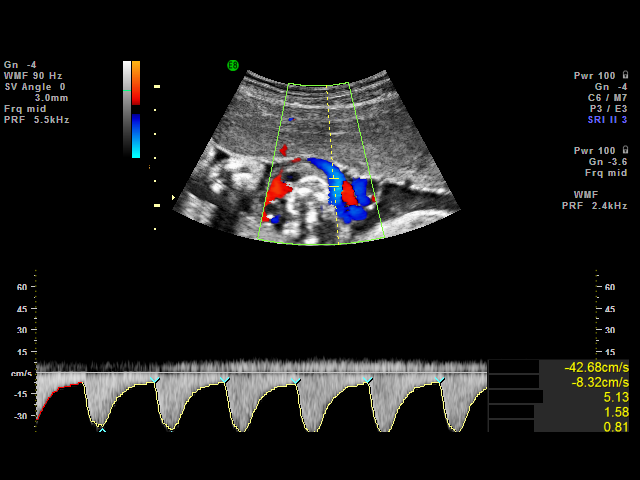
[im 4/12]
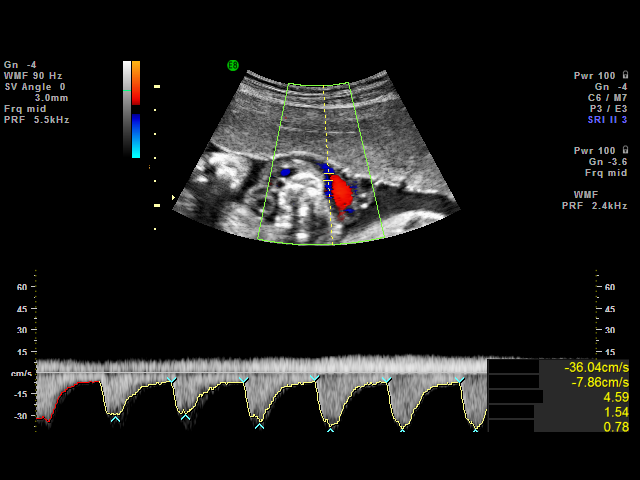
[im 5/12]
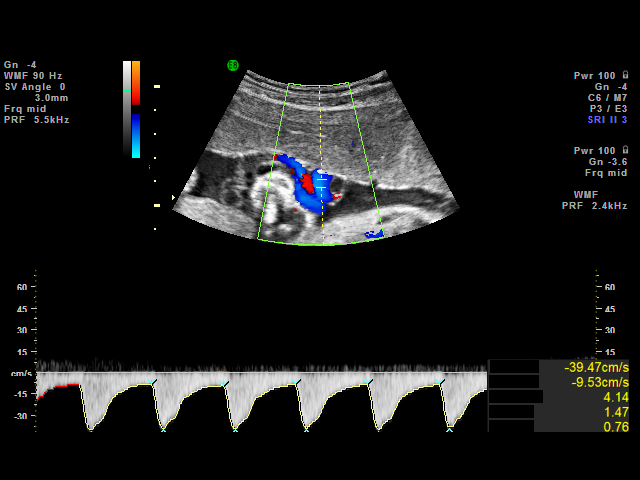
[im 6/12]
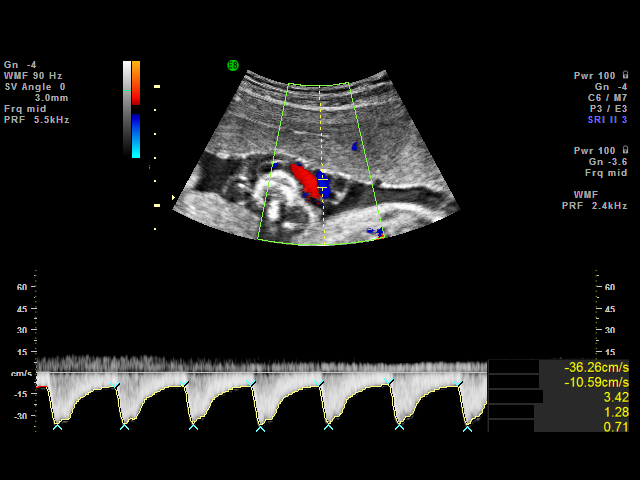
[im 7/12]
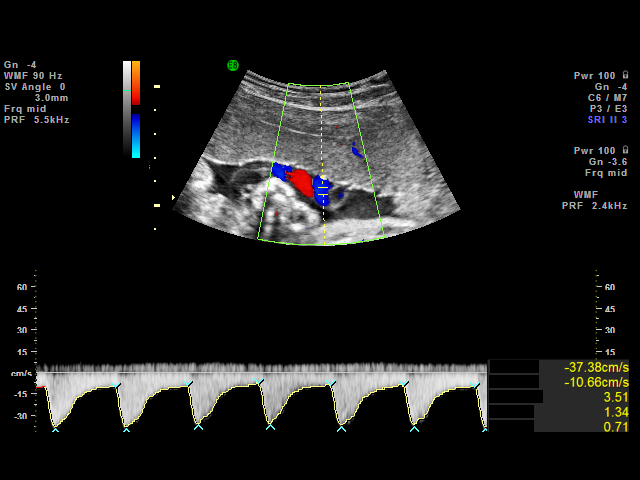
[im 8/12]
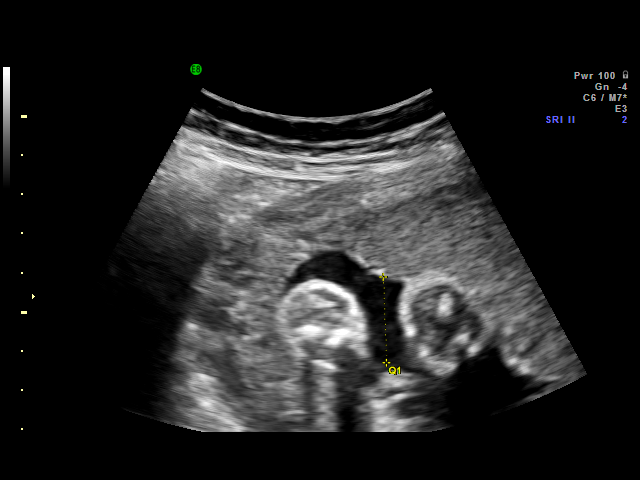
[im 9/12]
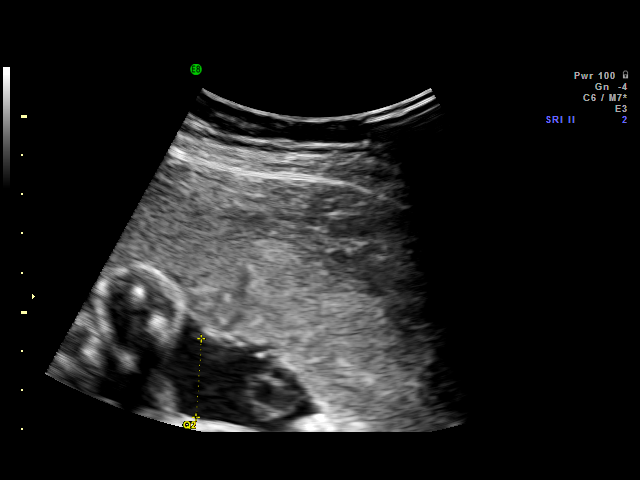
[im 10/12]
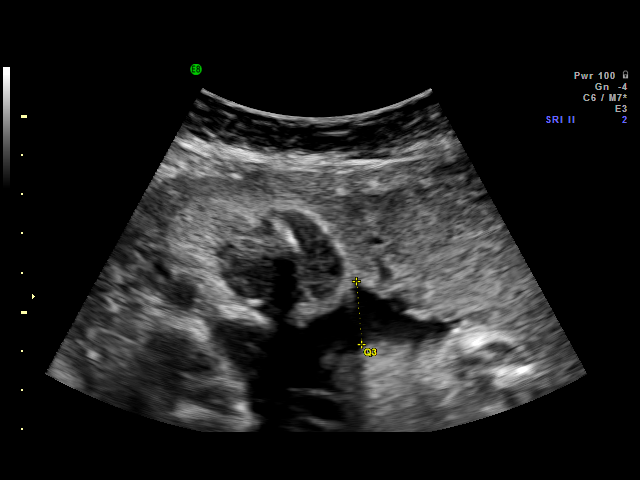
[im 11/12]
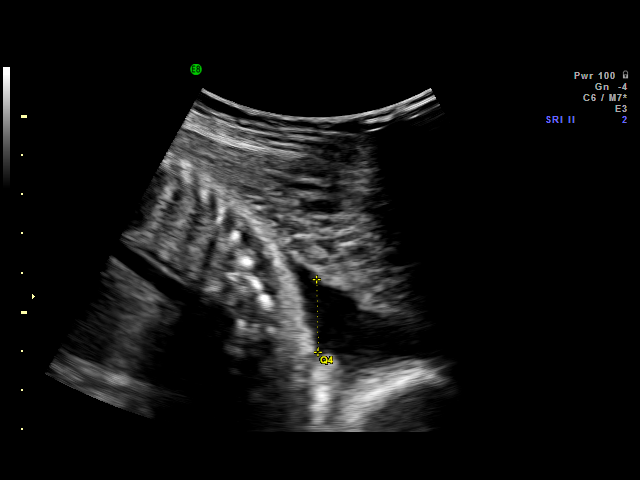
[im 12/12]
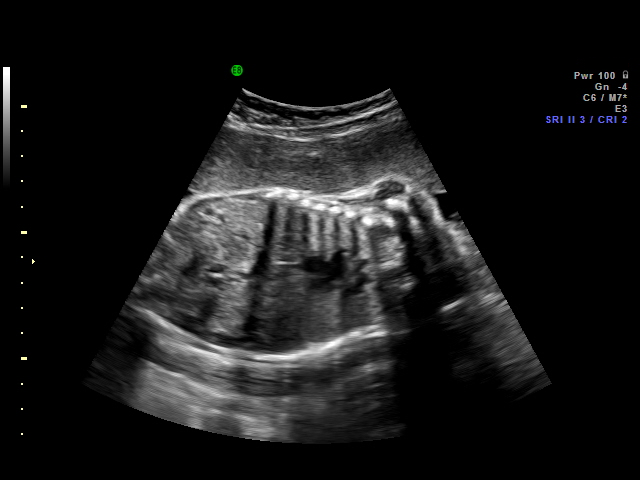

[12 of 12 positions shown; findings below may reference images not displayed]

IMPRESSION: AS OB/GYN has also been faxed to the ordering physician.

## 2007-12-30 ENCOUNTER — Encounter: Payer: Self-pay | Admitting: *Deleted

## 2007-12-30 IMAGING — US US UA DOPPLER RE-EVAL
1 series · 14 of 20 positions shown · non-contrast
Comparison: none

OBSTETRICAL ULTRASOUND:
 This ultrasound was performed in The [HOSPITAL], and the [facility] report will be stored to [REDACTED] PACS.

[Series 1: us ua doppler re-eval · 14 of 20 slices shown]
[im 1/20]
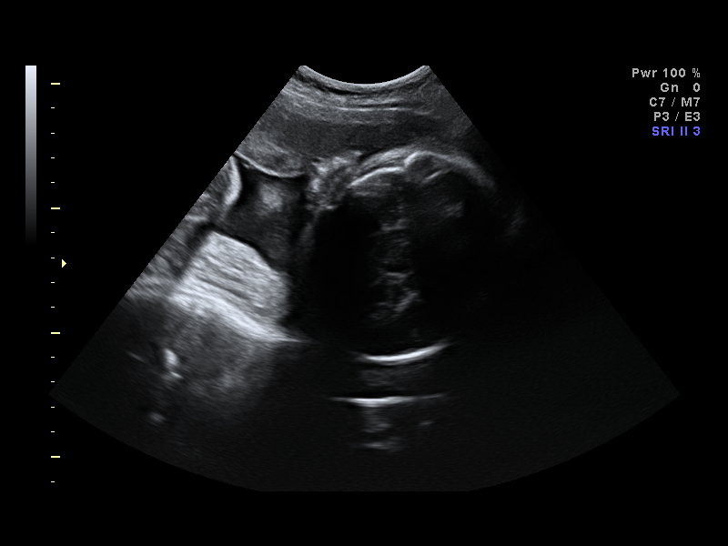
[im 3/20]
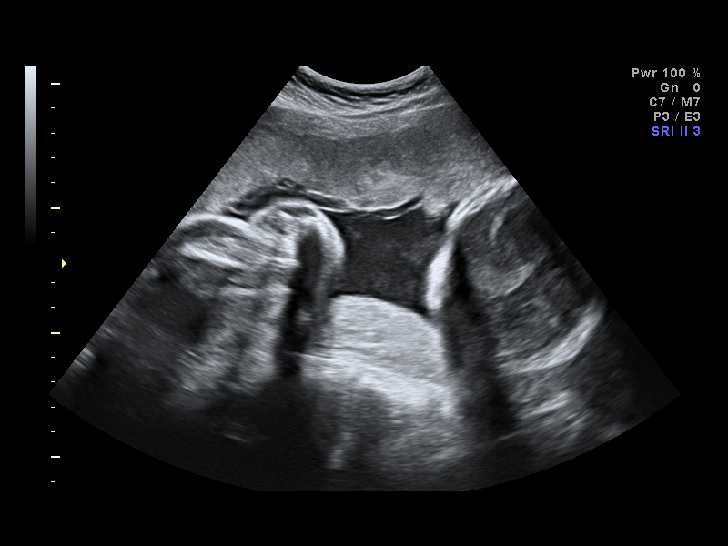
[im 4/20]
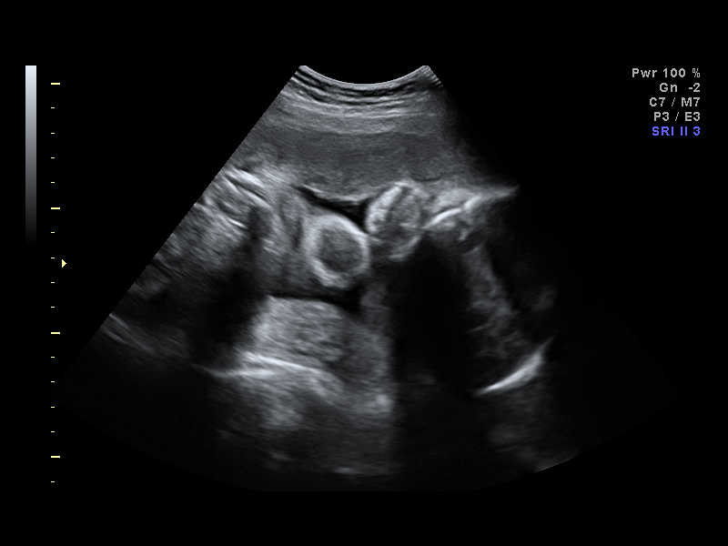
[im 6/20]
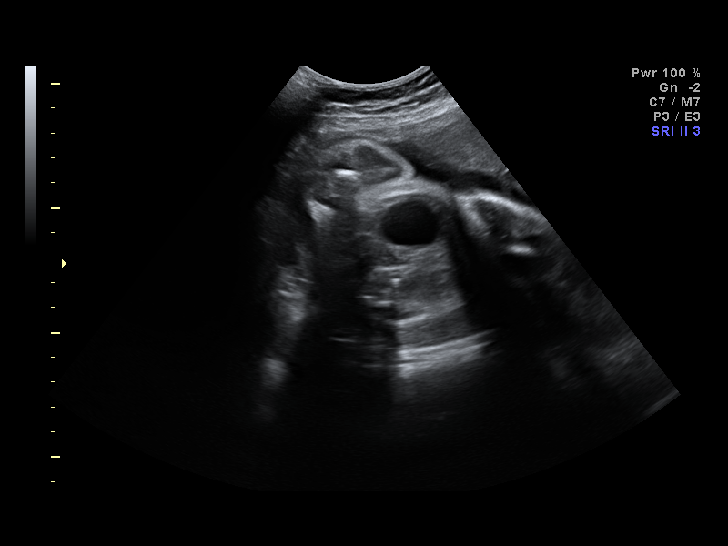
[im 7/20]
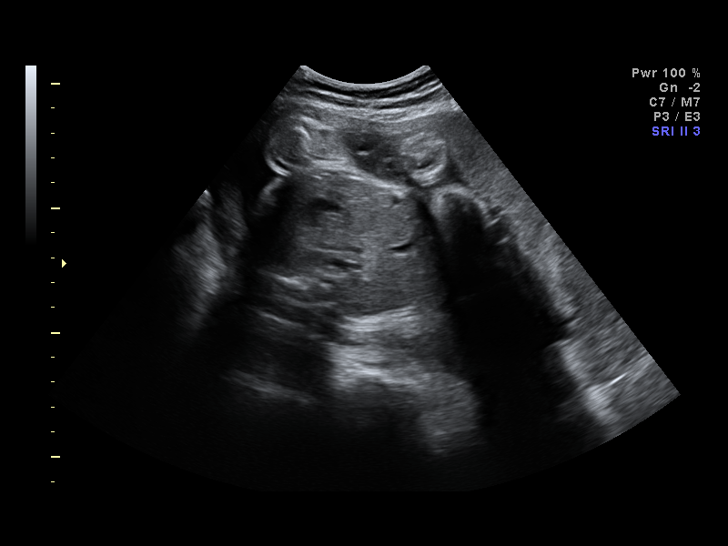
[im 8/20]
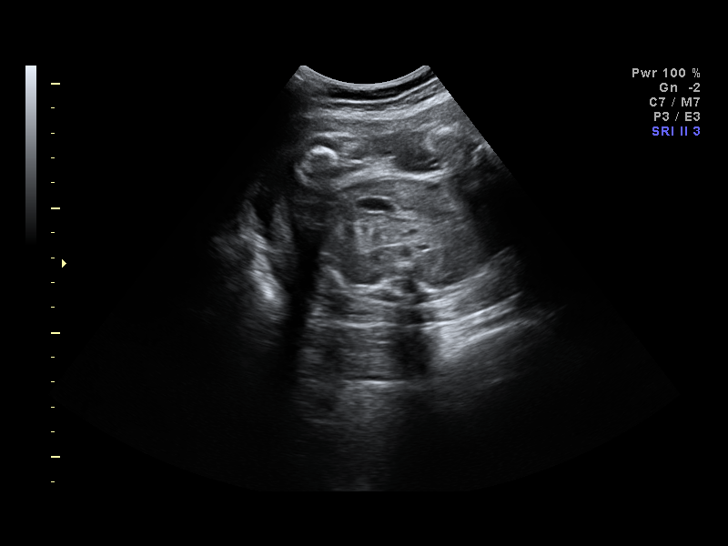
[im 10/20]
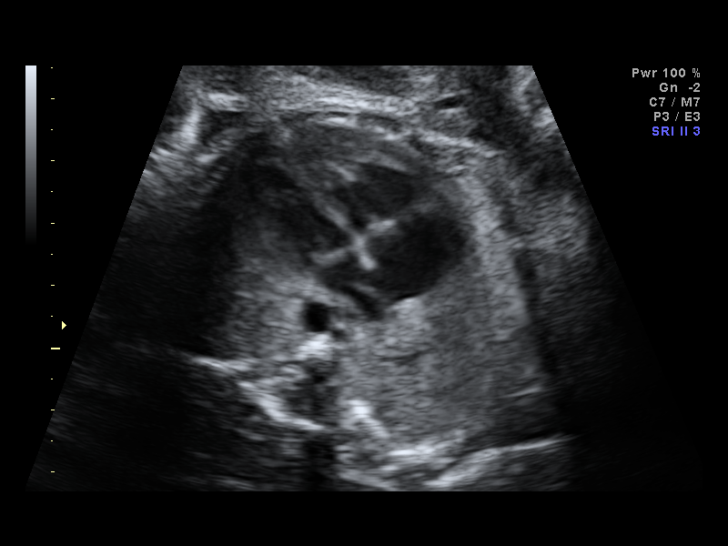
[im 11/20]
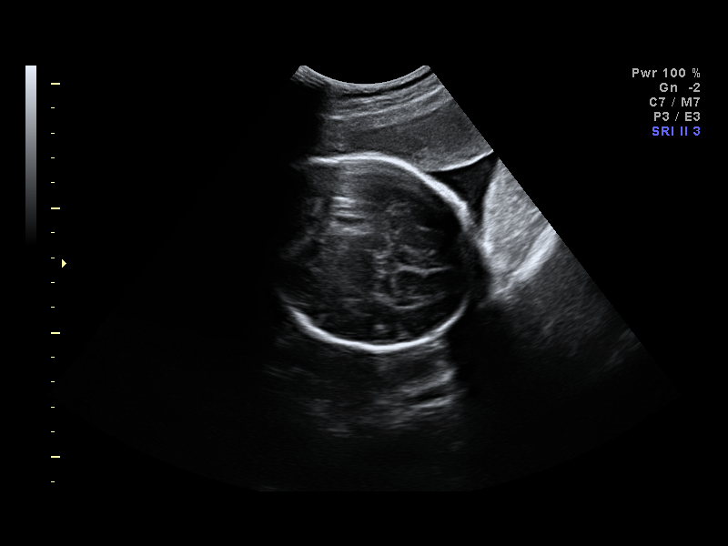
[im 13/20]
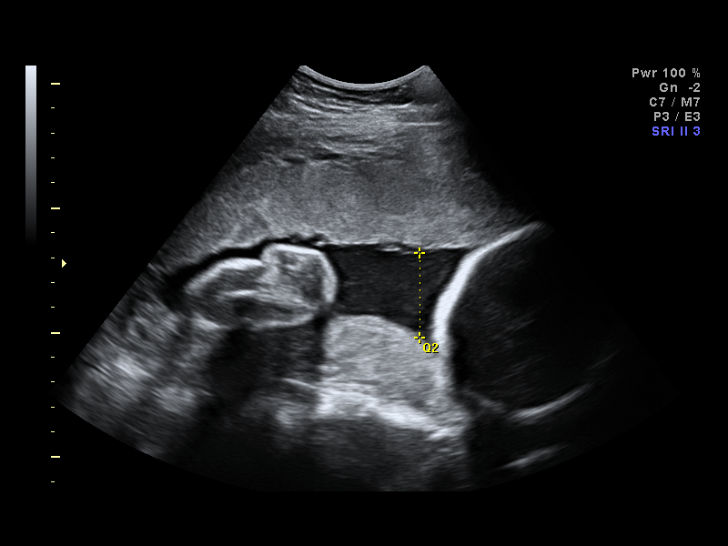
[im 14/20]
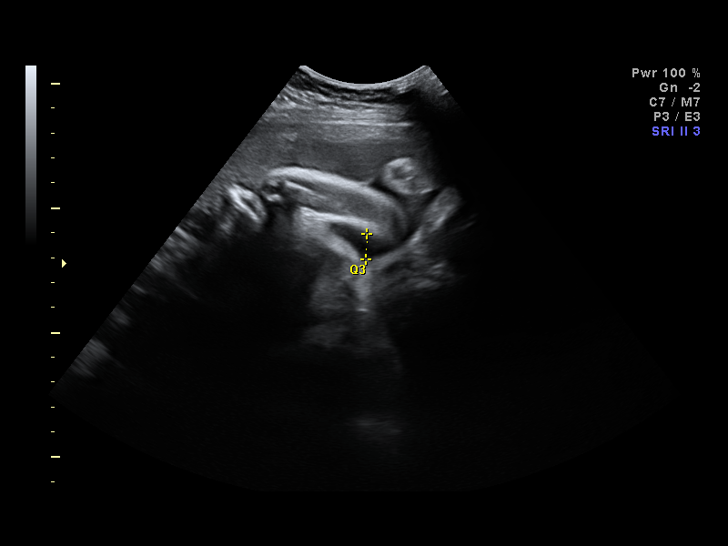
[im 16/20]
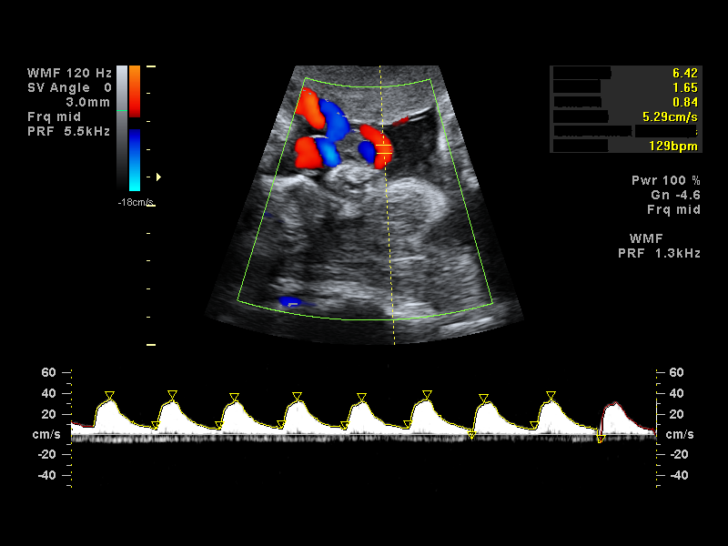
[im 17/20]
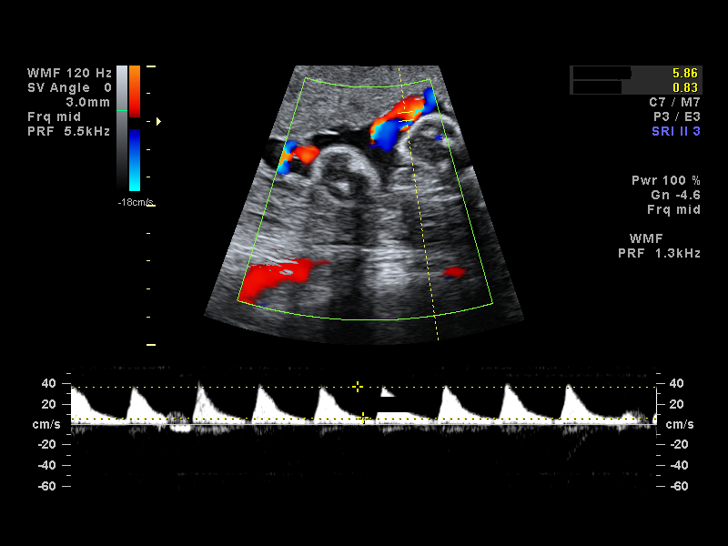
[im 18/20]
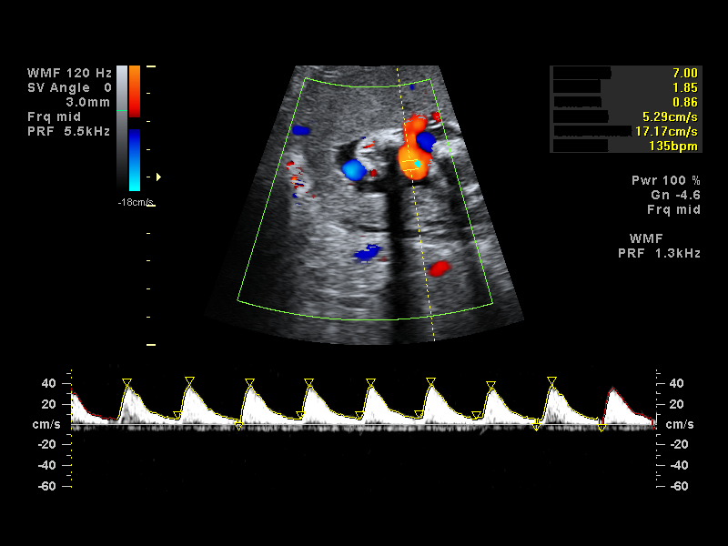
[im 20/20]
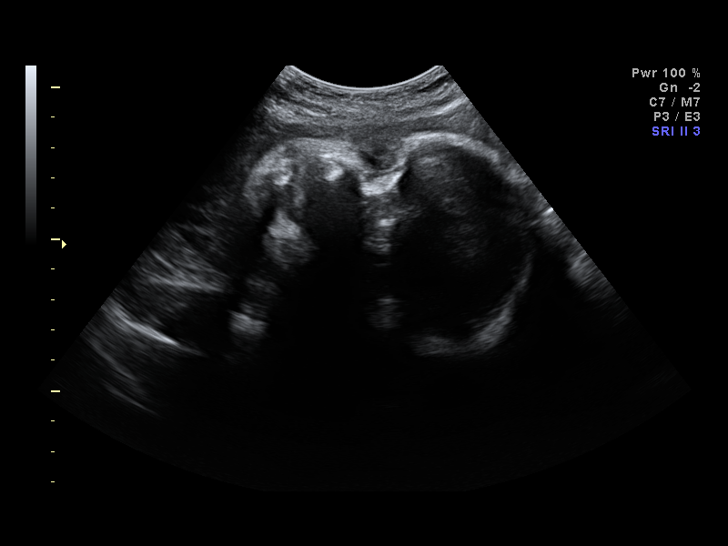

[14 of 20 positions shown; findings below may reference images not displayed]

IMPRESSION: [facility] has also been faxed to the ordering physician.

## 2007-12-31 IMAGING — US US FETAL BPP W/O NONSTRESS
1 series · 6 of 6 positions shown · non-contrast
Comparison: none

OBSTETRICAL ULTRASOUND:
 This ultrasound exam was performed in the [HOSPITAL] Ultrasound Department.  The OB US report was generated in the AS system, and faxed to the ordering physician.  This report is also available in [REDACTED] PACS.

[Series 1: us fetal bpp w/o nonstress · 0.24mm/px · 6 of 6 slices shown]
[im 1/6]
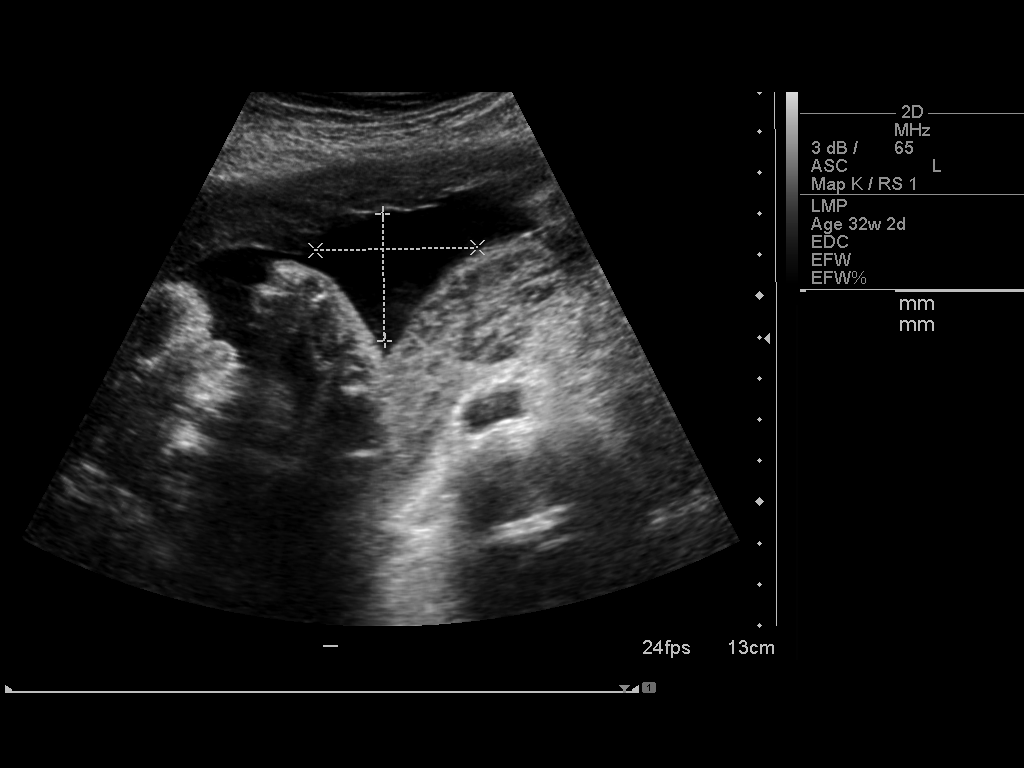
[im 2/6]
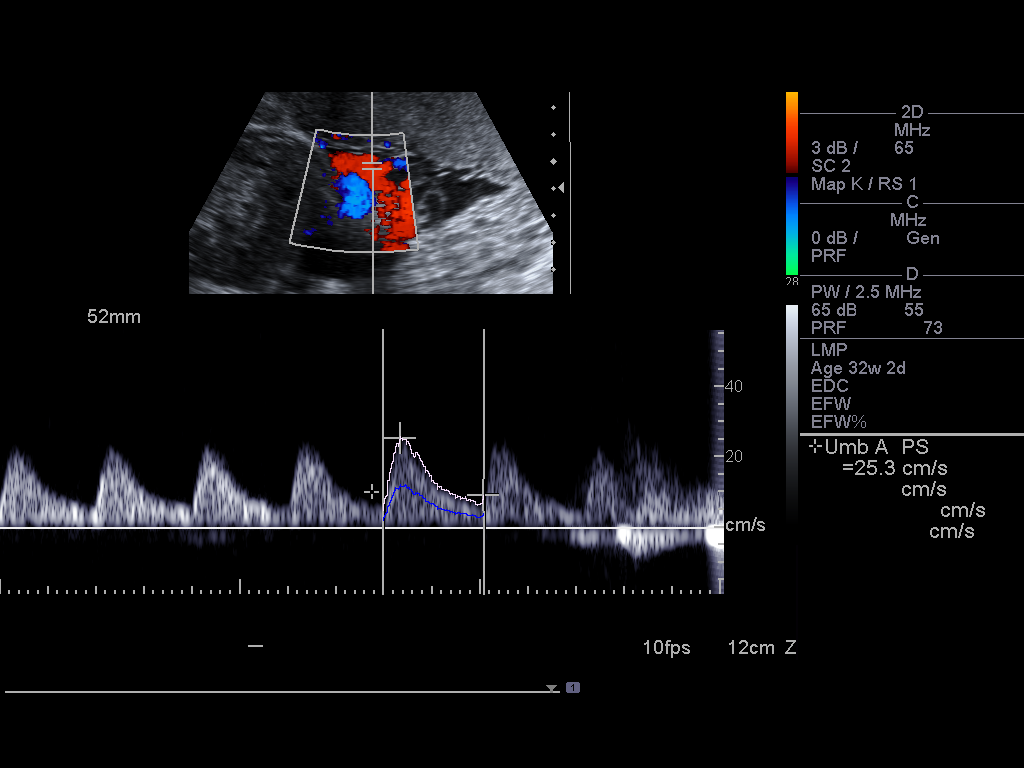
[im 3/6]
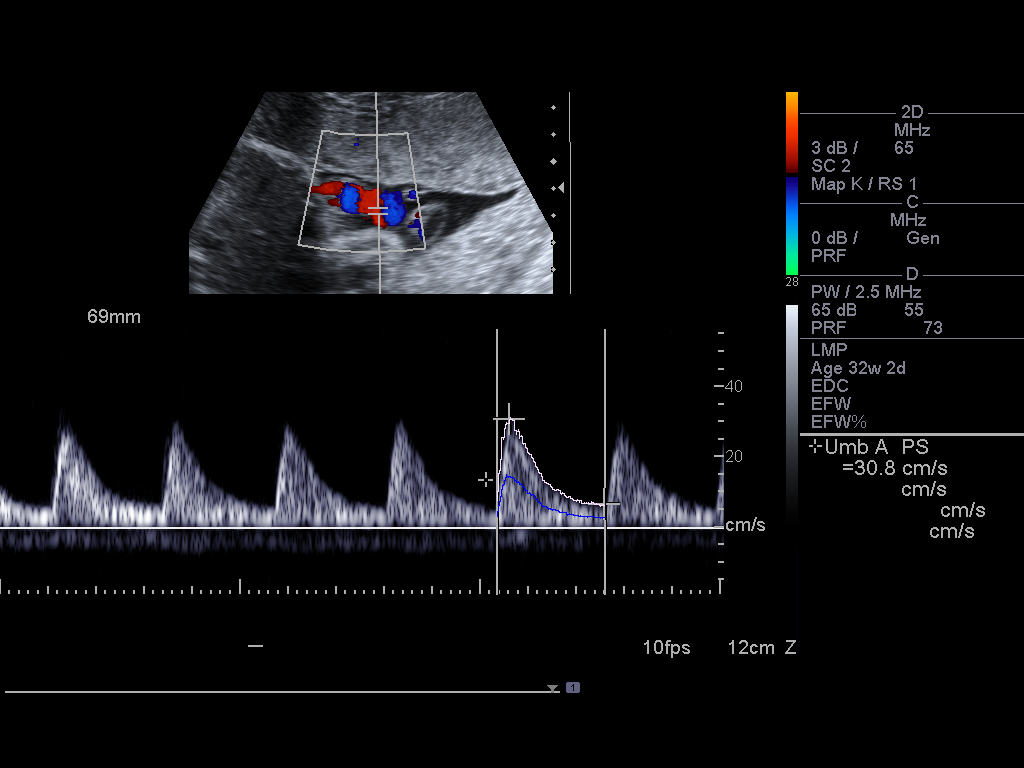
[im 4/6]
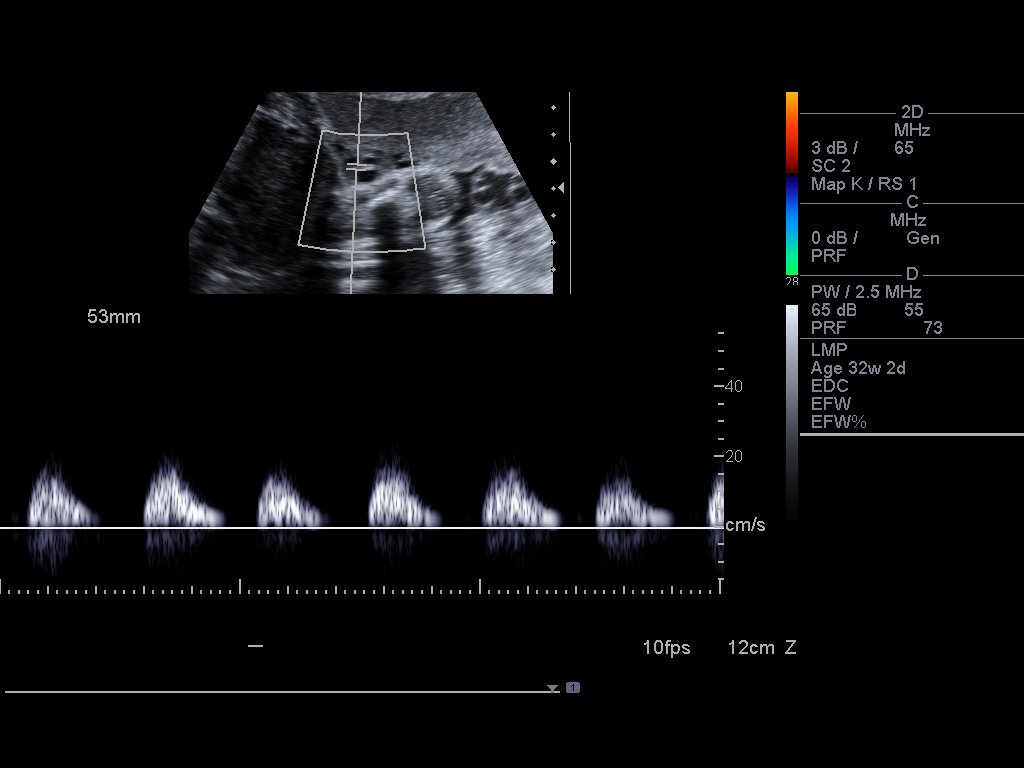
[im 5/6]
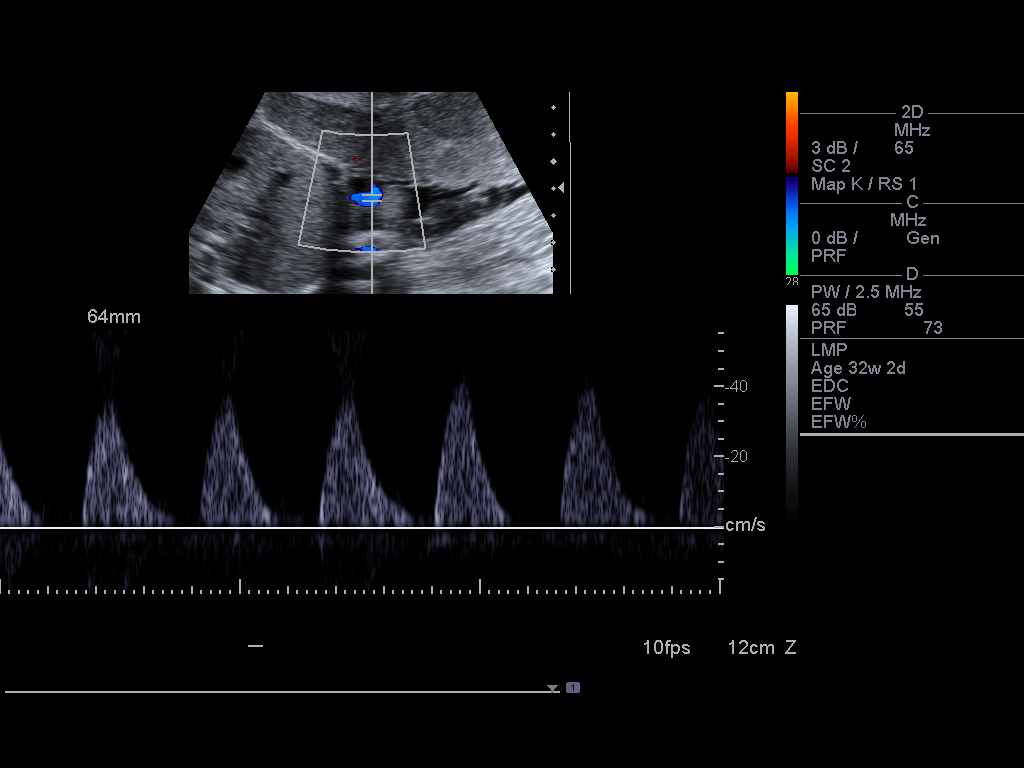
[im 6/6]
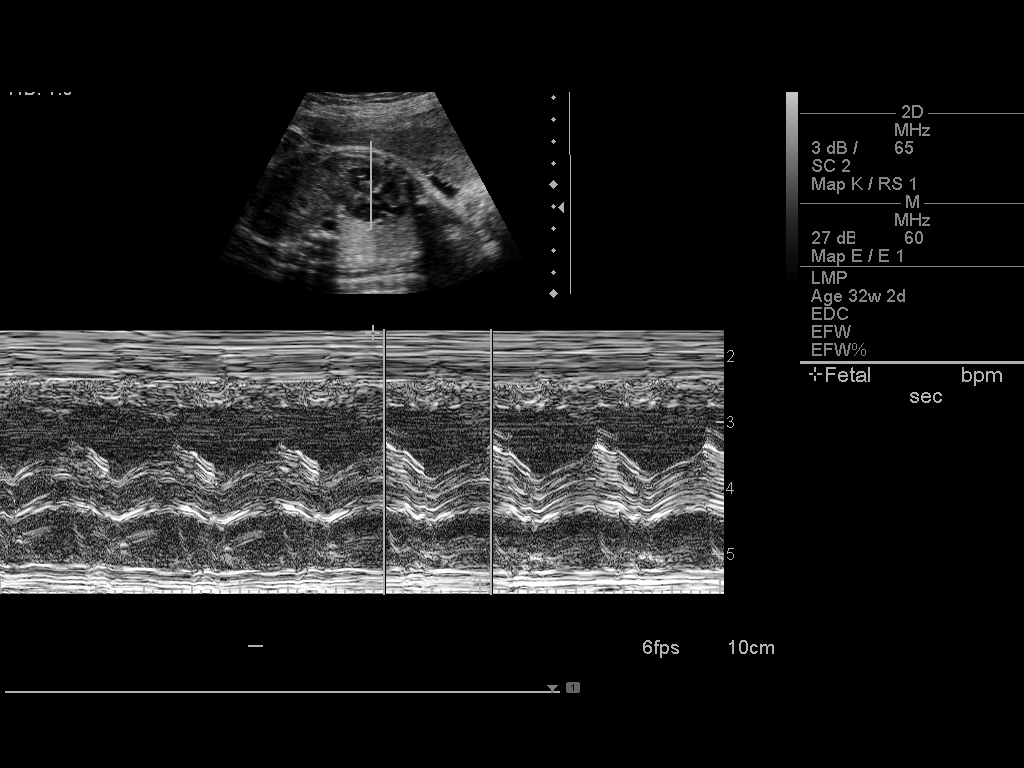

[6 of 6 positions shown; findings below may reference images not displayed]

IMPRESSION: See AS Obstetric US report.

## 2008-01-01 IMAGING — US US UA DOPPLER RE-EVAL
1 series · 2 of 2 positions shown · non-contrast
Comparison: none

OBSTETRICAL ULTRASOUND:
 This ultrasound exam was performed in the [HOSPITAL] Ultrasound Department.  The OB US report was generated in the AS system, and faxed to the ordering physician.  This report is also available in [REDACTED] PACS.

[Series 1: us ua doppler re-eval · 0.28mm/px · 2 of 2 slices shown]
[im 1/2]
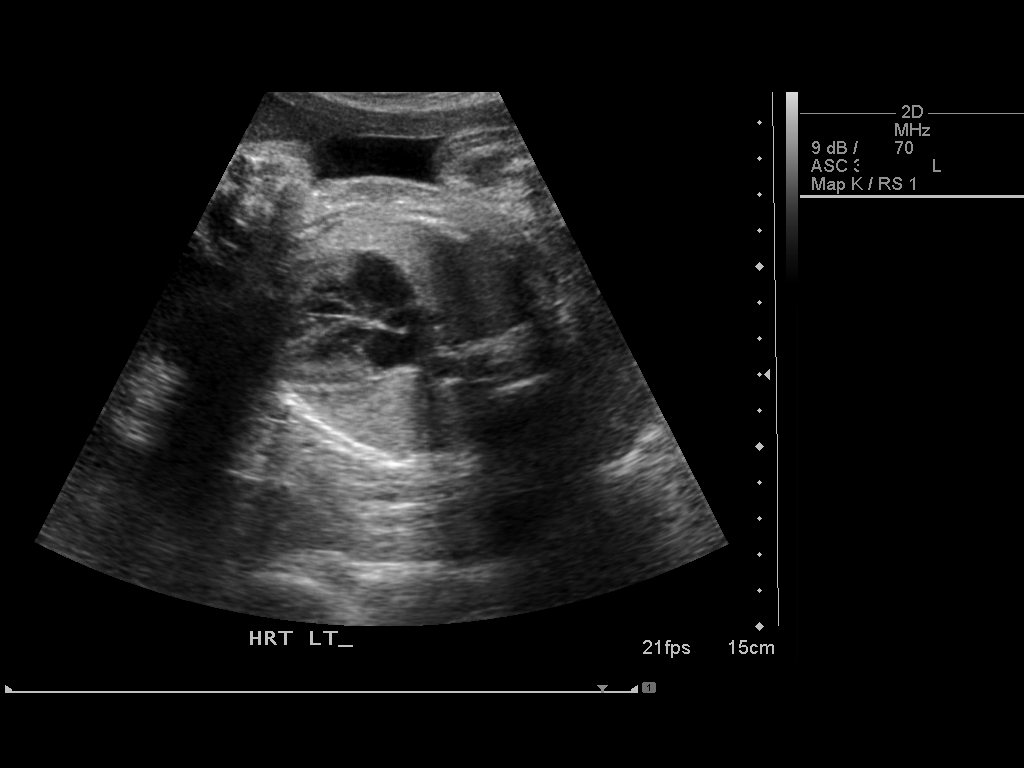
[im 2/2]
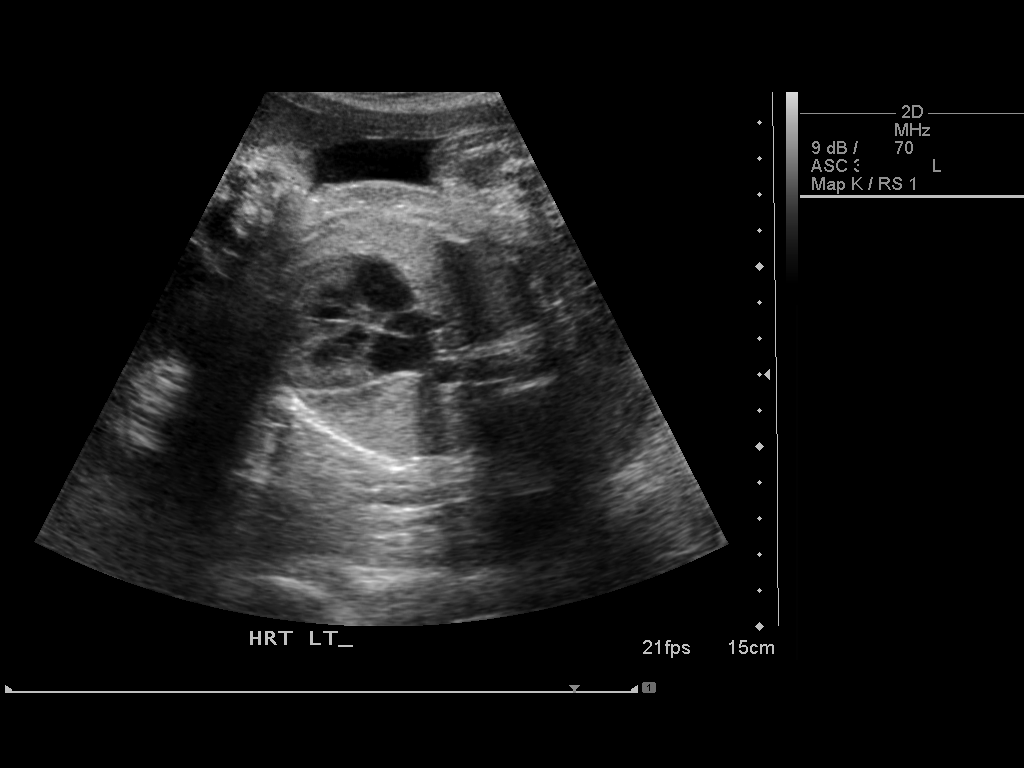

[2 of 2 positions shown; findings below may reference images not displayed]

IMPRESSION: See AS Obstetric US report.

## 2008-01-01 IMAGING — US US UA DOPPLER RE-EVAL
1 series · 13 of 13 positions shown · non-contrast
Comparison: none

OBSTETRICAL ULTRASOUND:
 This ultrasound exam was performed in the [HOSPITAL] Ultrasound Department.  The OB US report was generated in the AS system, and faxed to the ordering physician.  This report is also available in [REDACTED] PACS.

[Series 1: us ua doppler re-eval · 13 acquisitions, 13 frames shown]
[im 1/13]
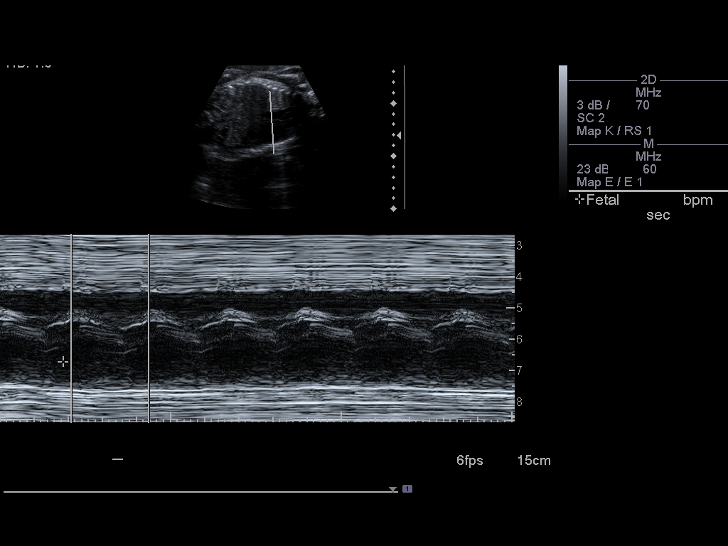
[im 2/13]
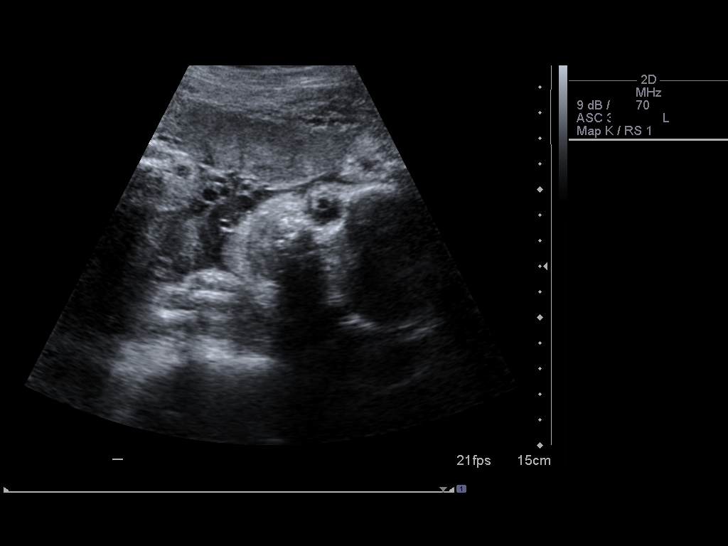
[im 3/13]
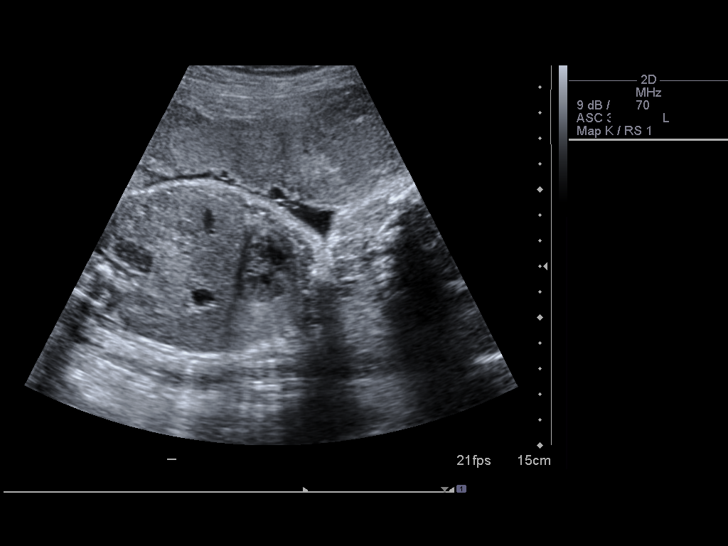
[im 4/13]
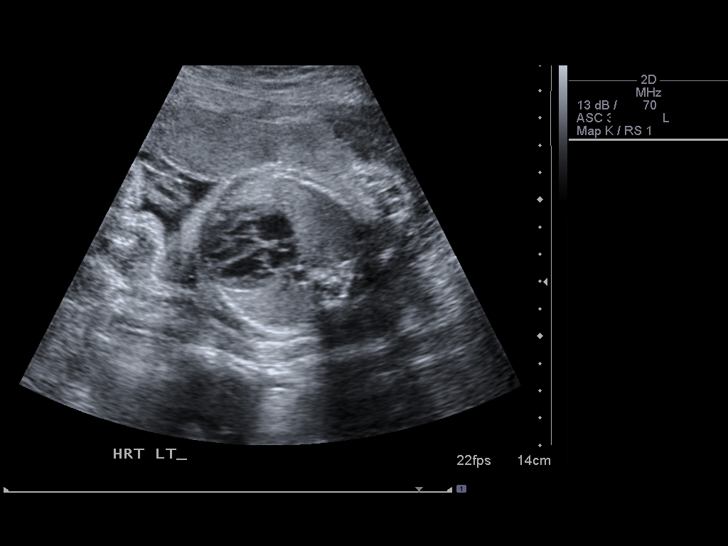
[im 5/13]
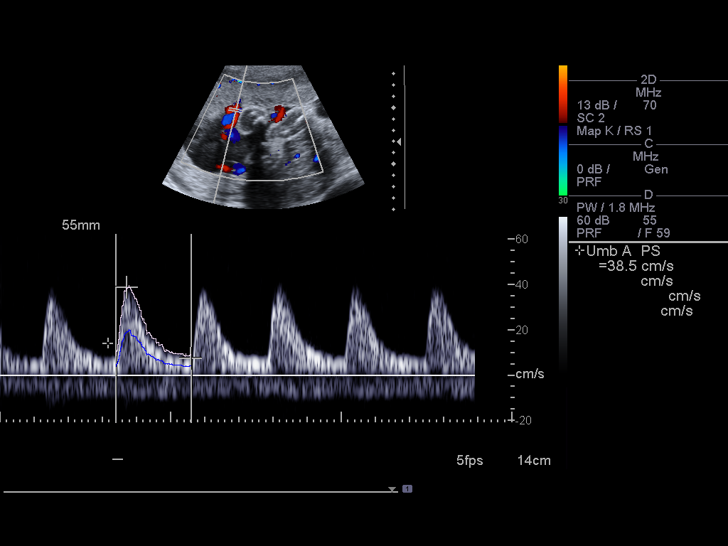
[im 6/13]
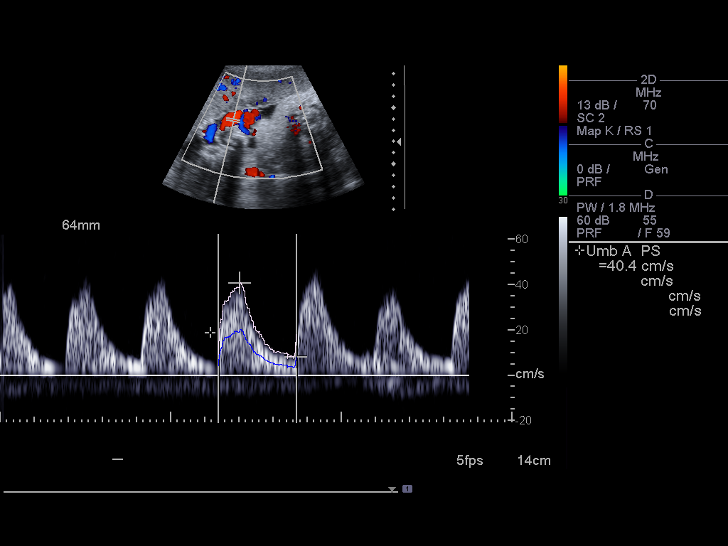
[im 7/13]
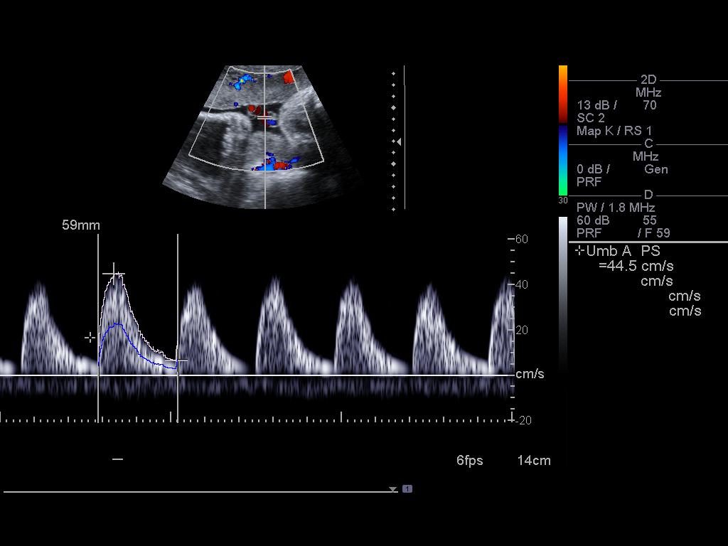
[im 8/13]
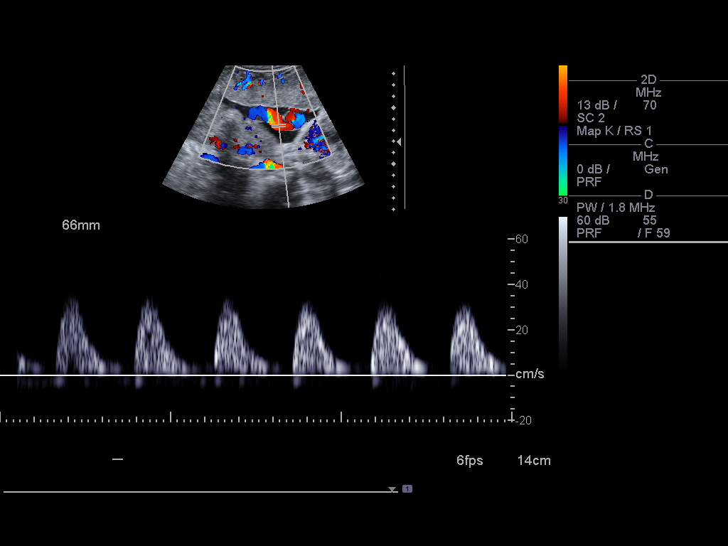
[im 9/13]
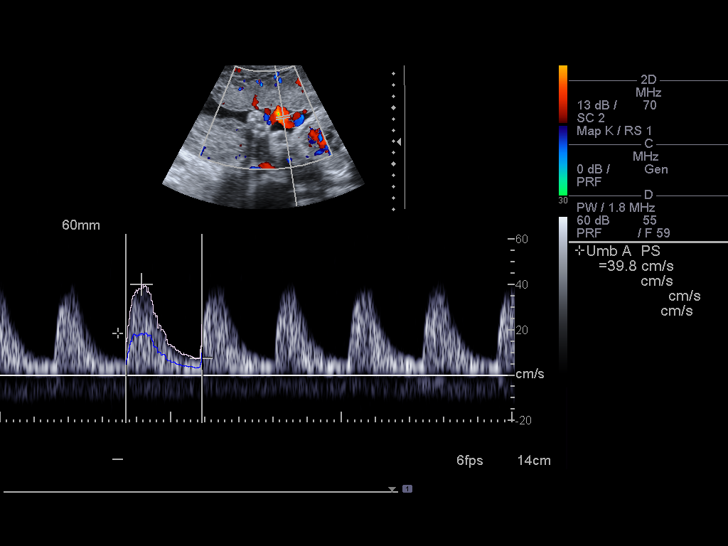
[im 10/13]
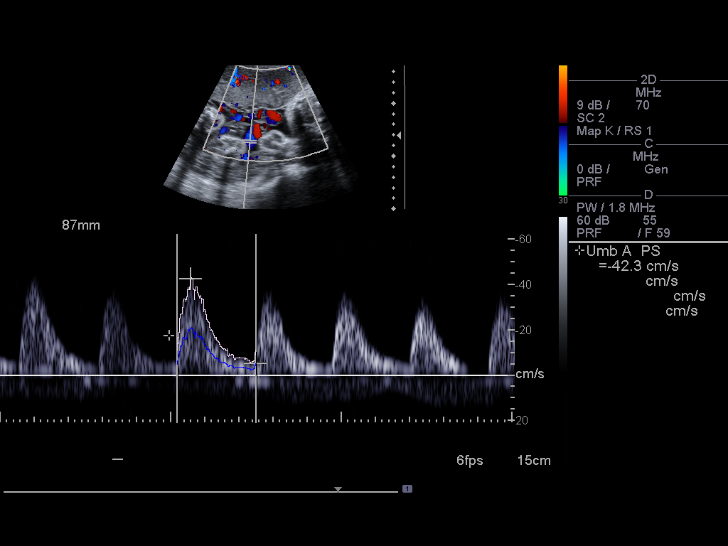
[im 11/13]
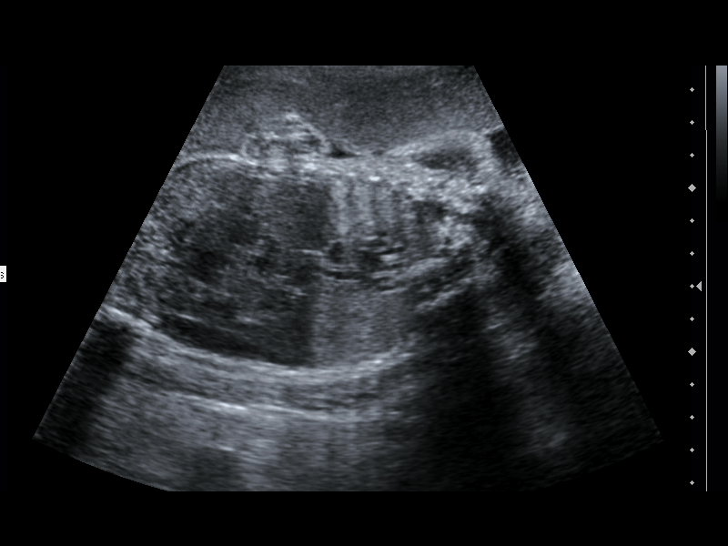
[im 12/13]
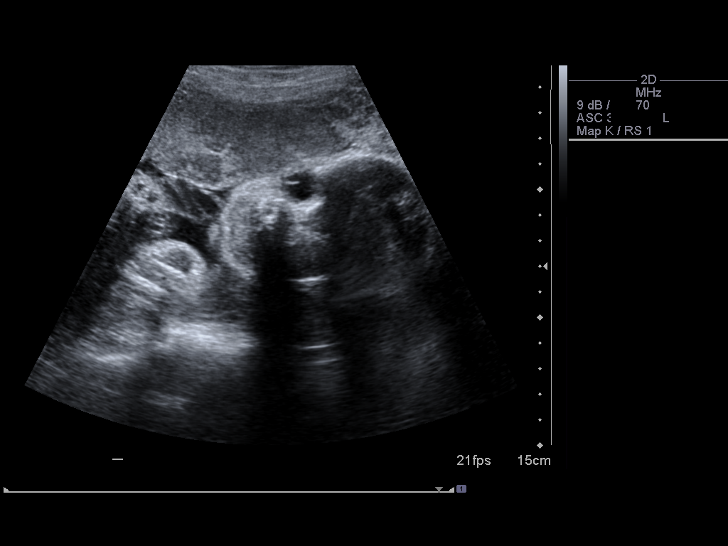
[im 13/13]
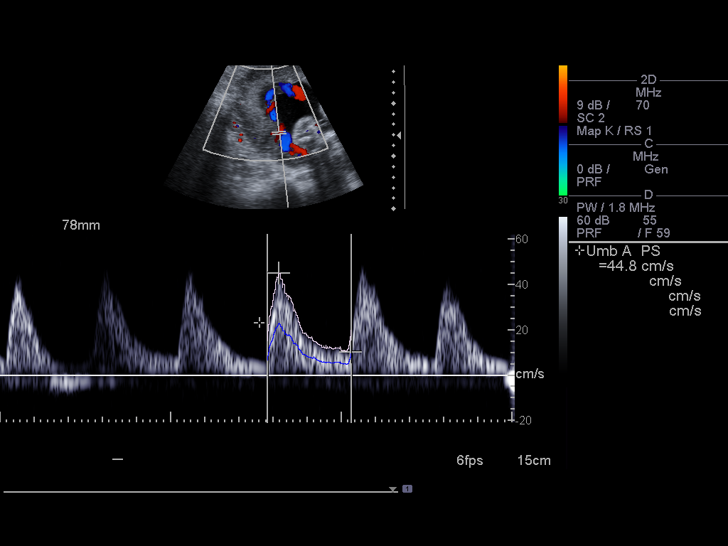

[13 of 13 positions shown; findings below may reference images not displayed]

IMPRESSION: See AS Obstetric US report.

## 2008-01-02 ENCOUNTER — Encounter: Payer: Self-pay | Admitting: *Deleted

## 2008-01-02 IMAGING — US US UA DOPPLER RE-EVAL
1 series · 5 of 5 positions shown · non-contrast
Comparison: none

OBSTETRICAL ULTRASOUND:
 This ultrasound was performed in The [HOSPITAL], and the AS OB/GYN report will be stored to [REDACTED] PACS.

[Series 1: us ua doppler re-eval · 5 of 5 slices shown]
[im 1/5]
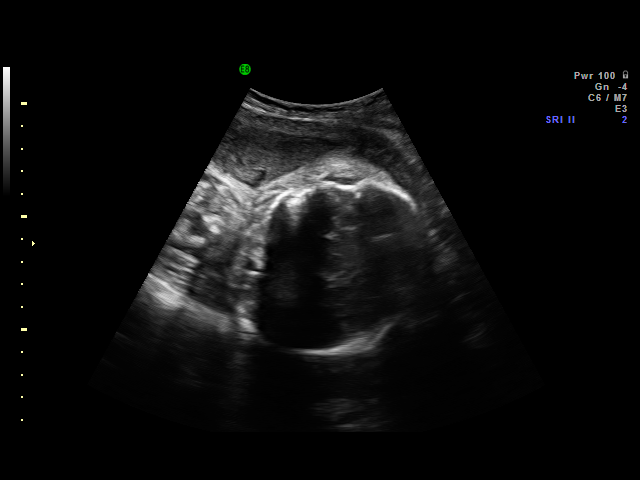
[im 2/5]
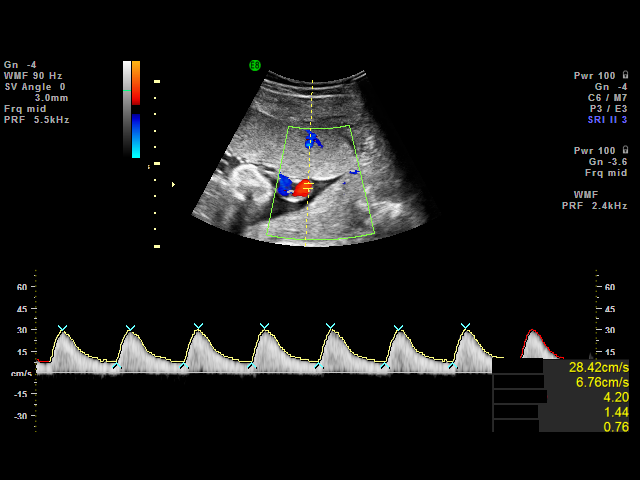
[im 3/5]
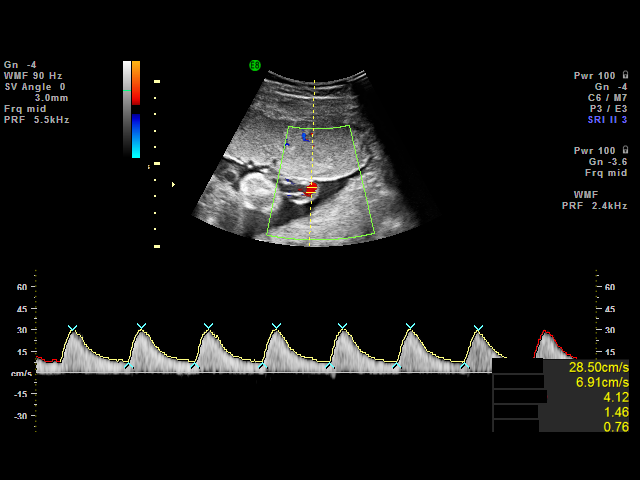
[im 4/5]
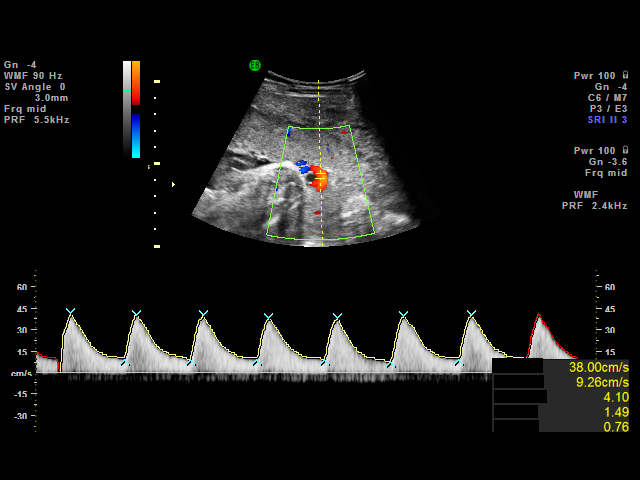
[im 5/5]
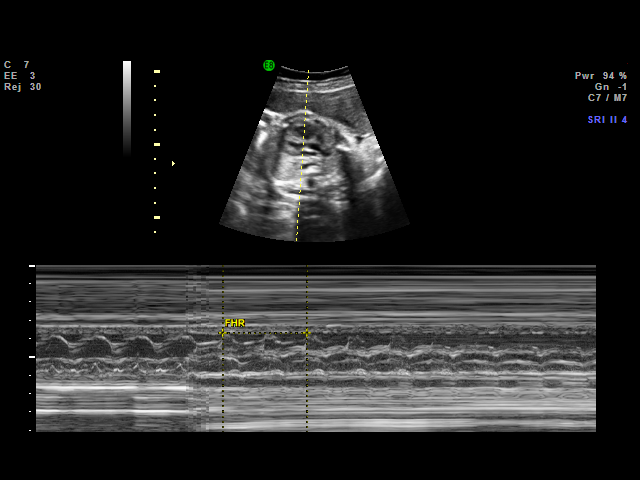

[5 of 5 positions shown; findings below may reference images not displayed]

IMPRESSION: AS OB/GYN has also been faxed to the ordering physician.

## 2008-01-03 ENCOUNTER — Encounter: Payer: Self-pay | Admitting: *Deleted

## 2008-01-03 IMAGING — US US UA DOPPLER RE-EVAL
1 series · 14 of 15 positions shown · non-contrast
Comparison: none

OBSTETRICAL ULTRASOUND:
 This ultrasound was performed in The [HOSPITAL], and the AS OB/GYN report will be stored to [REDACTED] PACS.

[Series 1: us ua doppler re-eval · 14 of 15 slices shown]
[im 1/15]
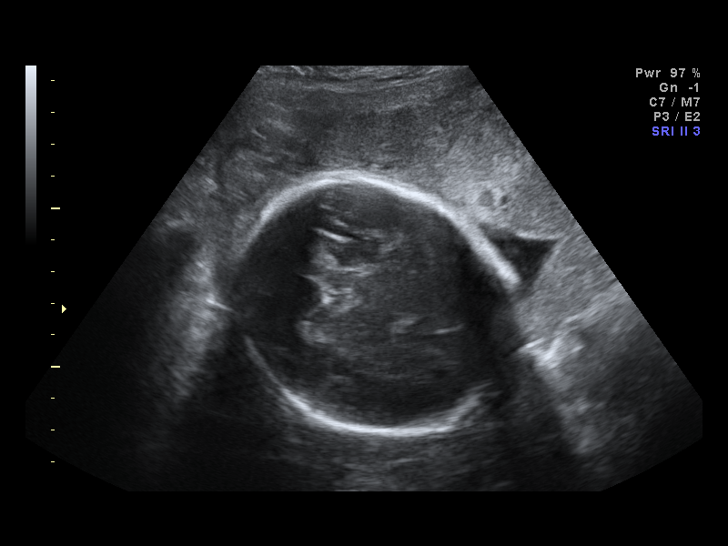
[im 2/15]
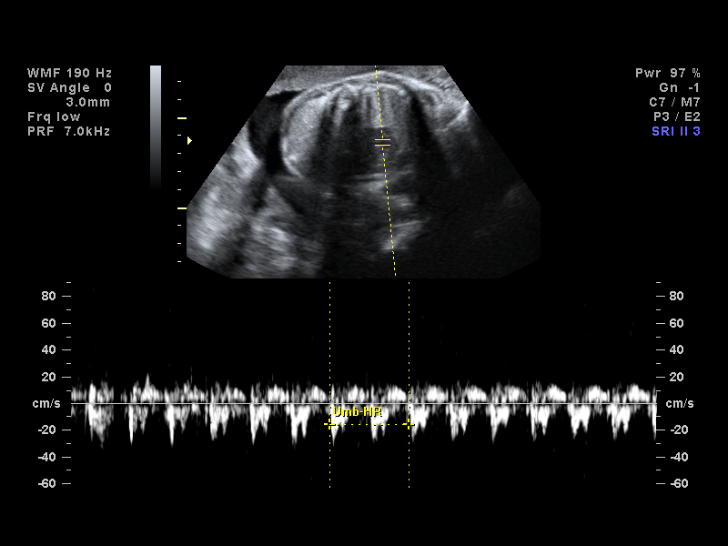
[im 3/15]
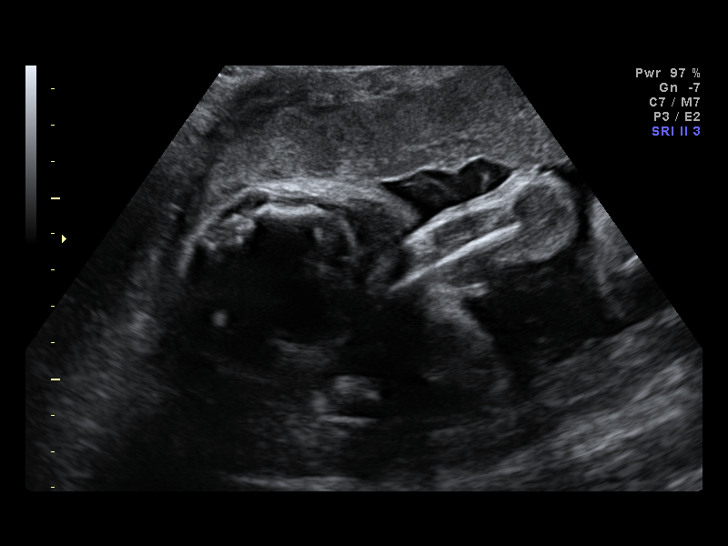
[im 4/15]
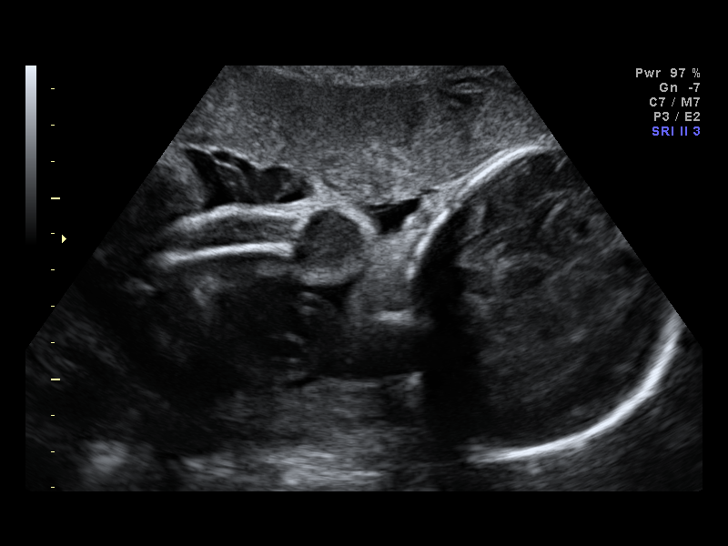
[im 5/15]
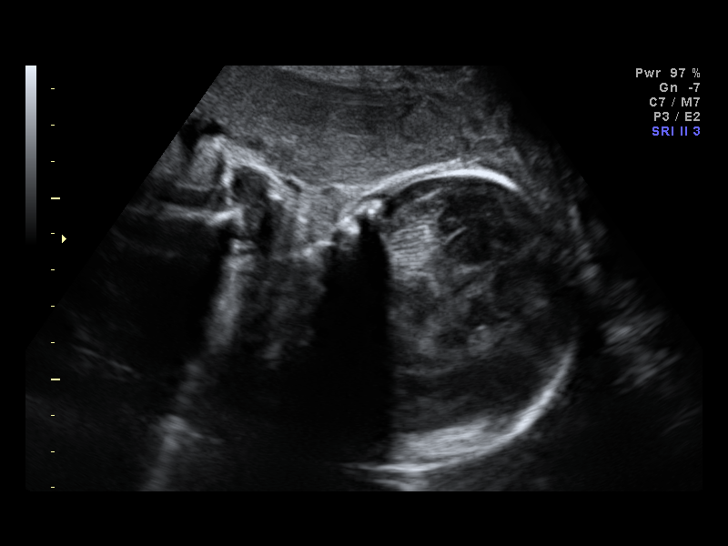
[im 6/15]
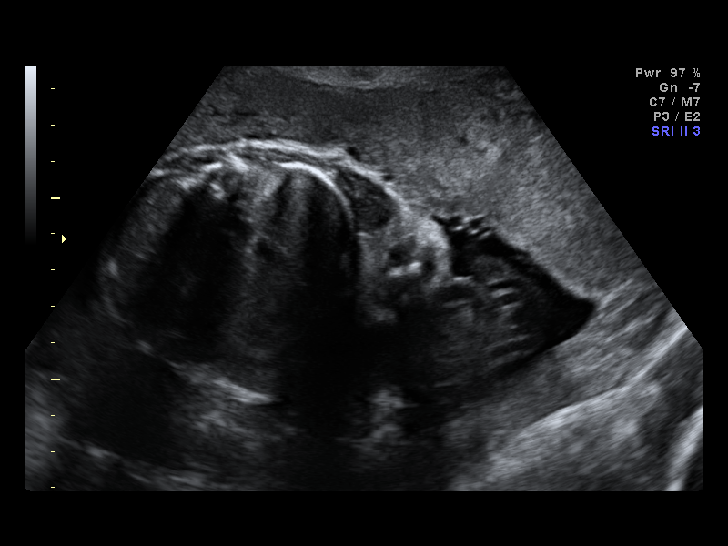
[im 7/15]
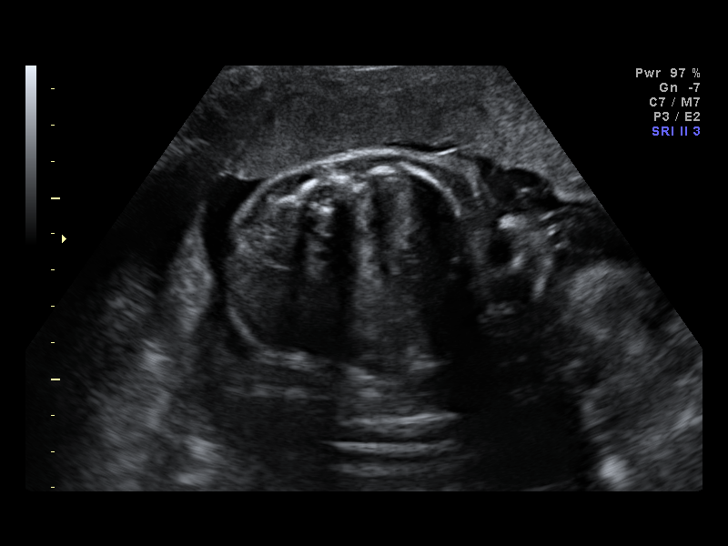
[im 9/15]
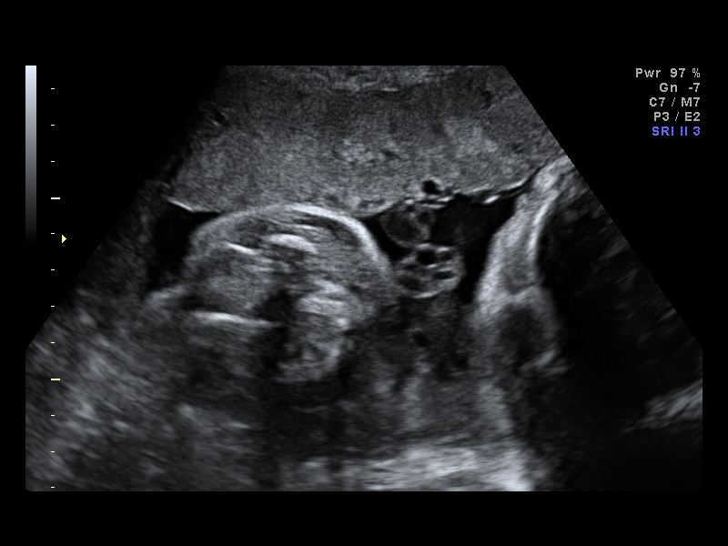
[im 10/15]
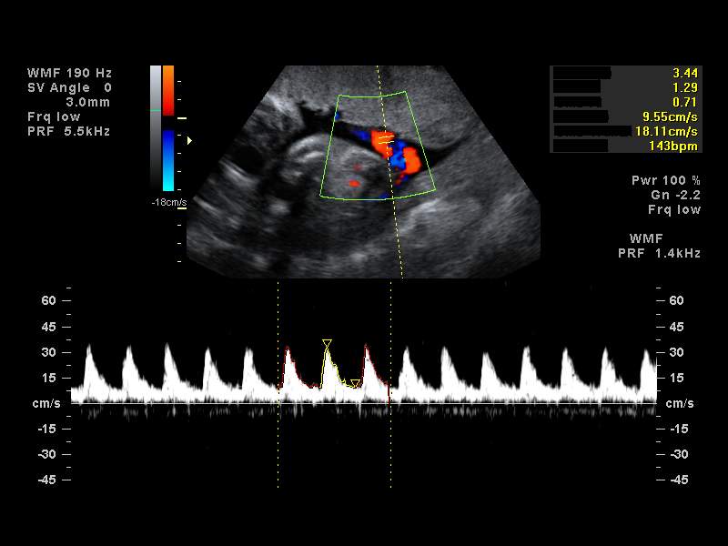
[im 11/15]
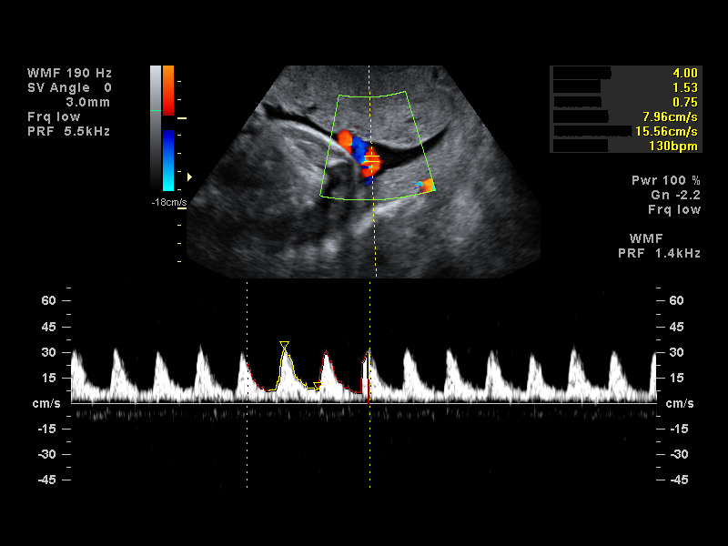
[im 12/15]
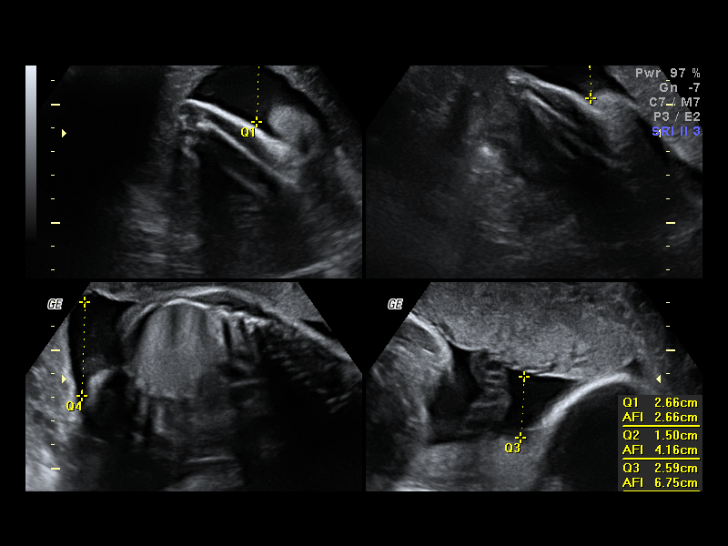
[im 13/15]
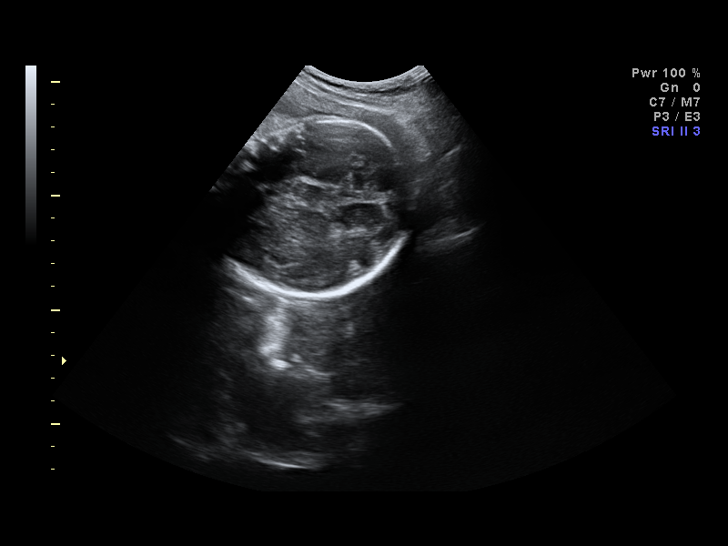
[im 14/15]
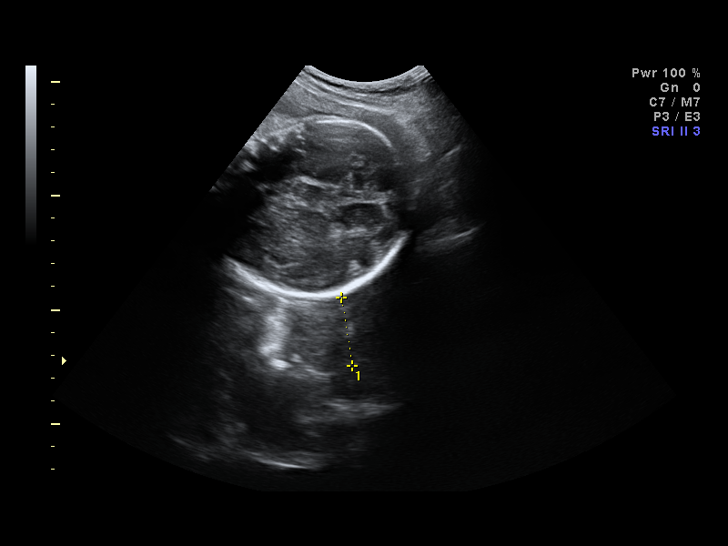
[im 15/15]
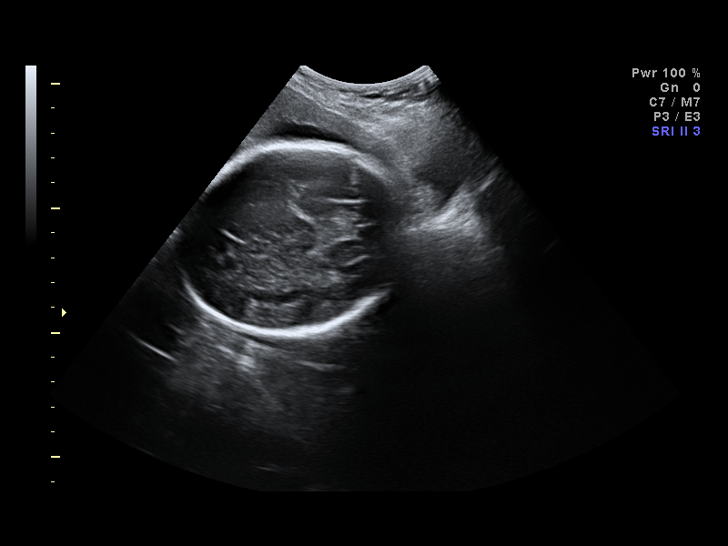

[14 of 15 positions shown; findings below may reference images not displayed]

IMPRESSION: AS OB/GYN has also been faxed to the ordering physician.

## 2008-01-04 ENCOUNTER — Encounter: Payer: Self-pay | Admitting: *Deleted

## 2008-01-04 IMAGING — US US UA DOPPLER RE-EVAL
1 series · 18 of 19 positions shown · non-contrast
Comparison: none

OBSTETRICAL ULTRASOUND:
 This ultrasound was performed in The [HOSPITAL], and the AS OB/GYN report will be stored to [REDACTED] PACS.

[Series 1: us ua doppler re-eval · 19 acquisitions, 18 frames shown]
[im 1/19]
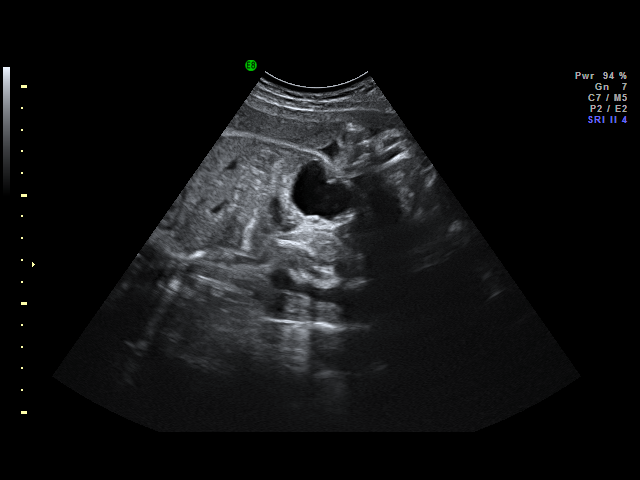
[im 2/19]
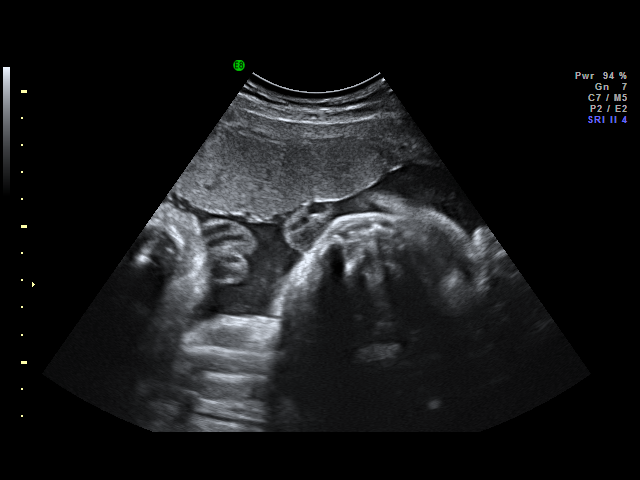
[im 3/19]
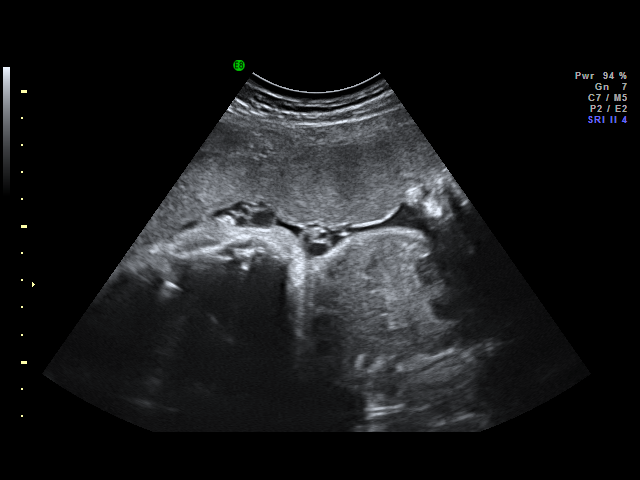
[im 4/19]
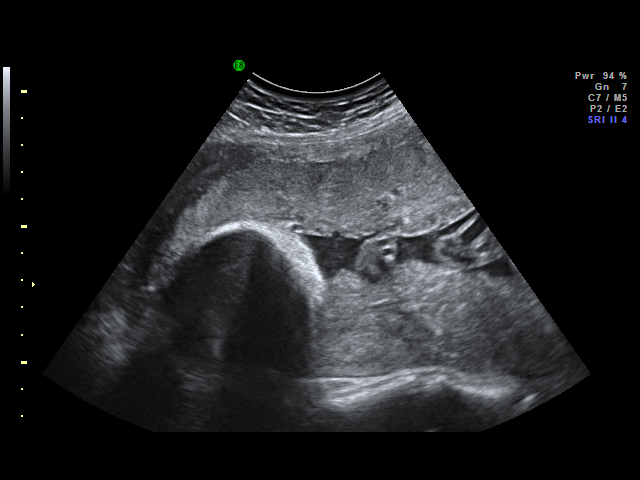
[im 5/19]
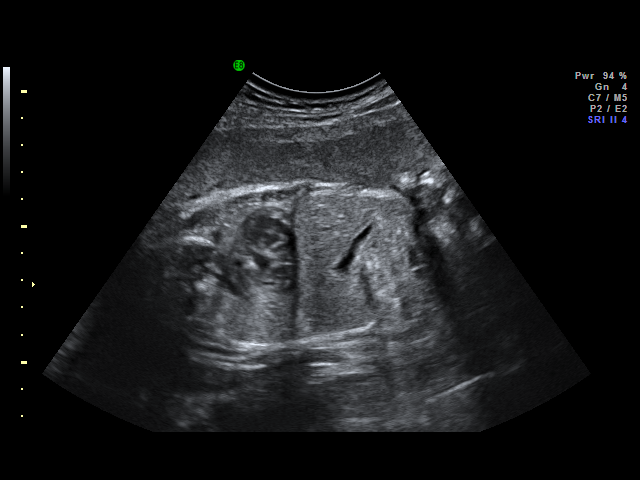
[im 6/19]
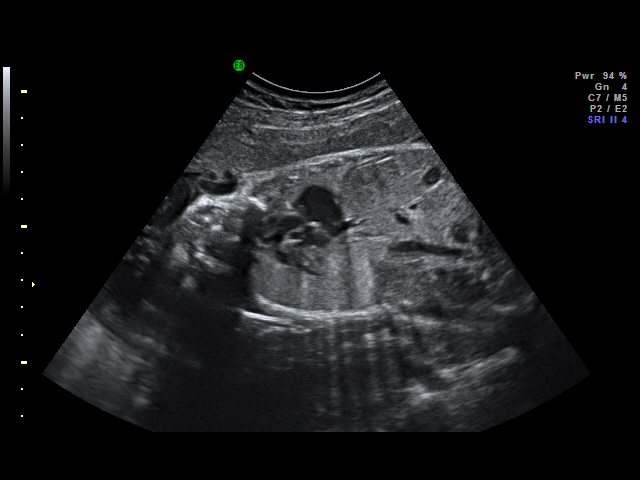
[im 7/19]
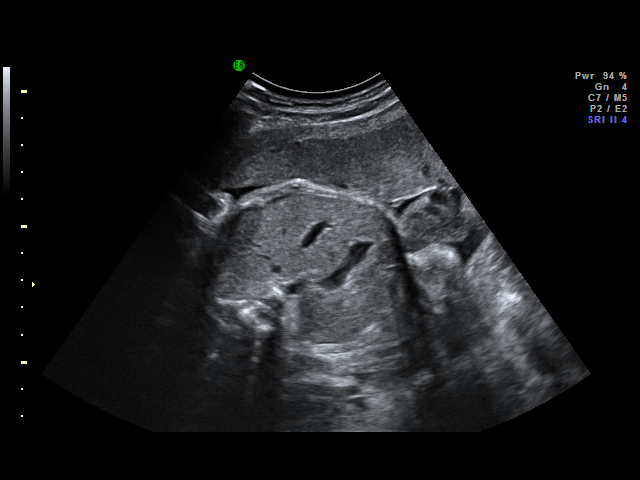
[im 8/19]
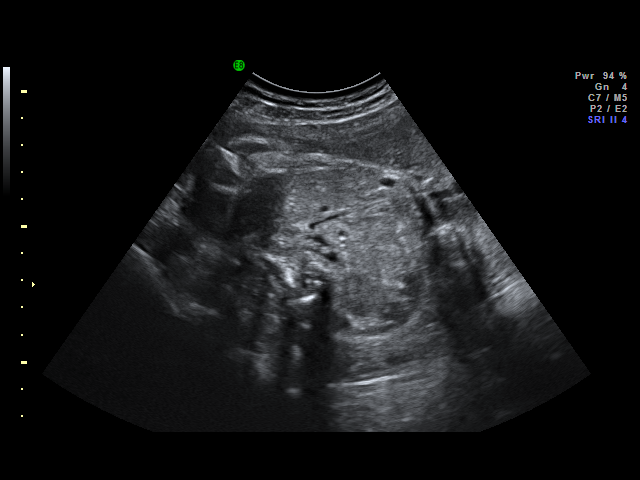
[im 9/19]
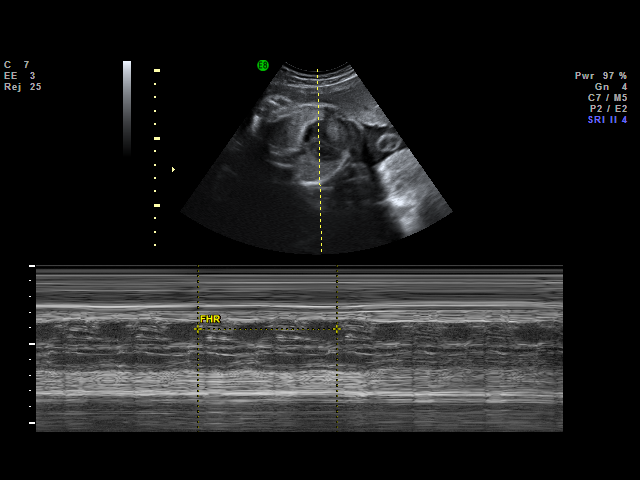
[im 11/19]
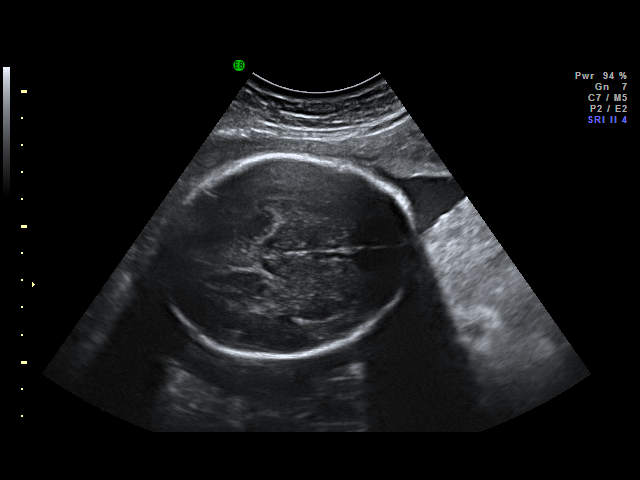
[im 12/19]
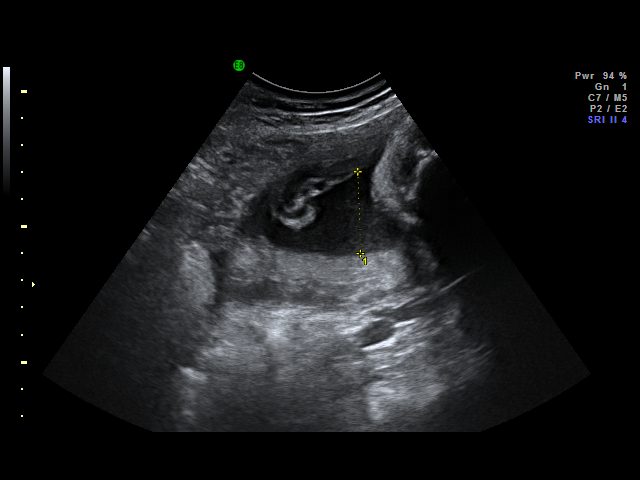
[im 13/19]
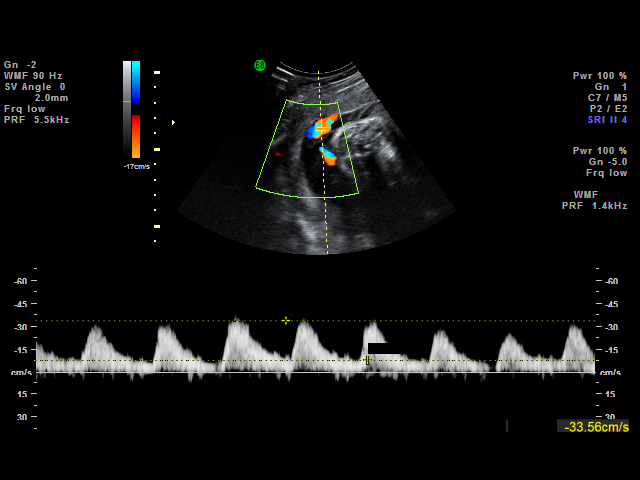
[im 14/19]
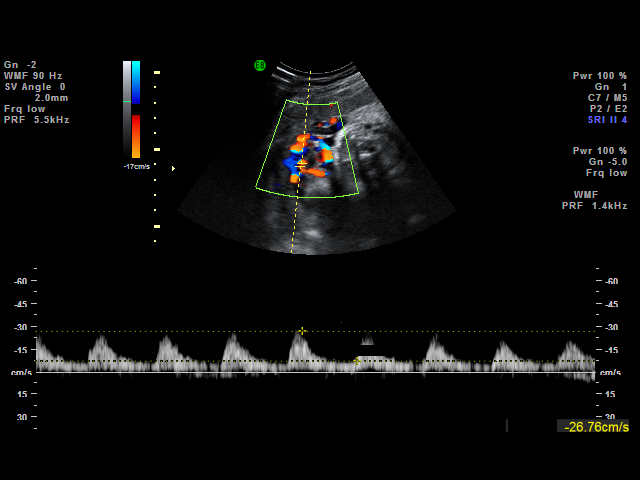
[im 15/19]
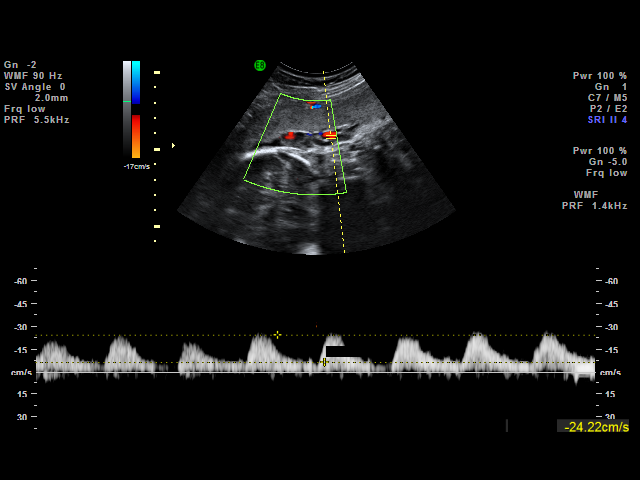
[im 16/19]
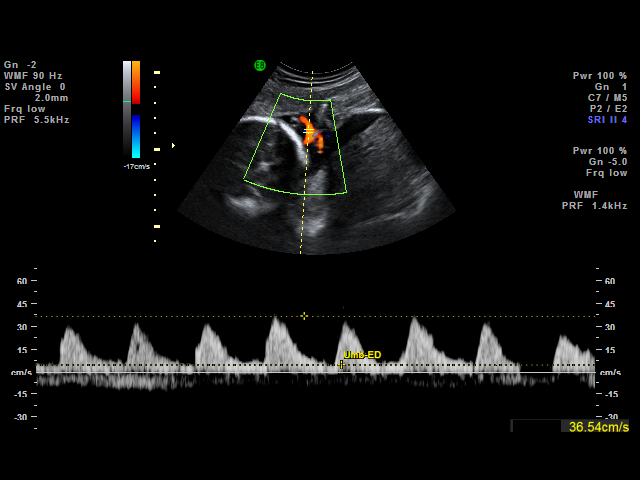
[im 17/19]
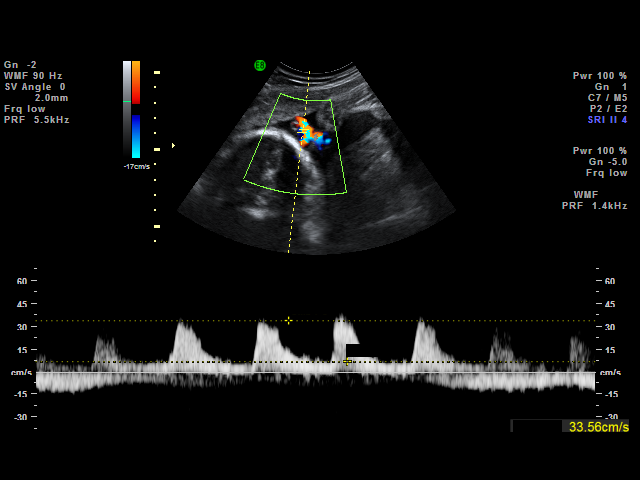
[im 18/19]
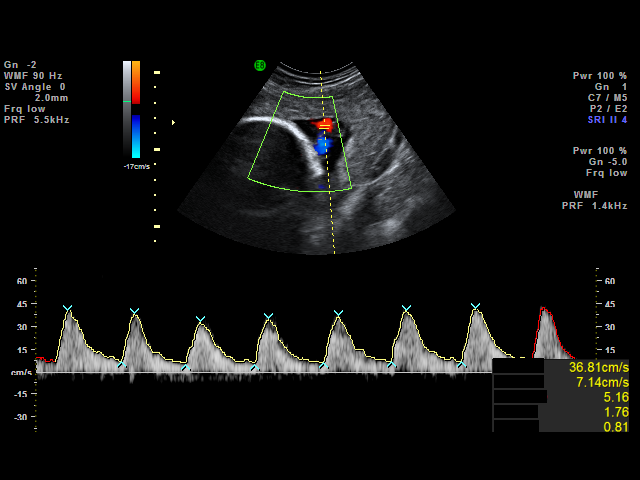
[im 19/19]
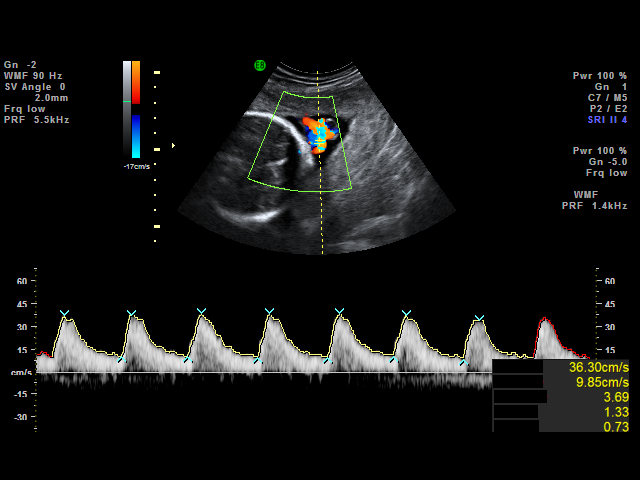

[18 of 19 positions shown; findings below may reference images not displayed]

IMPRESSION: AS OB/GYN has also been faxed to the ordering physician.

## 2008-01-05 ENCOUNTER — Encounter: Payer: Self-pay | Admitting: *Deleted

## 2008-01-05 IMAGING — US US FETAL BPP W/O NONSTRESS
1 series · 18 of 28 positions shown · non-contrast
Comparison: none

OBSTETRICAL ULTRASOUND:
 This ultrasound exam was performed in the [HOSPITAL] Ultrasound Department.  The OB US report was generated in the AS system, and faxed to the ordering physician.  This report is also available in [REDACTED] PACS.

[Series 1: us fetal bpp w/o nonstress · 18 of 32 slices shown]
[im 1/32]
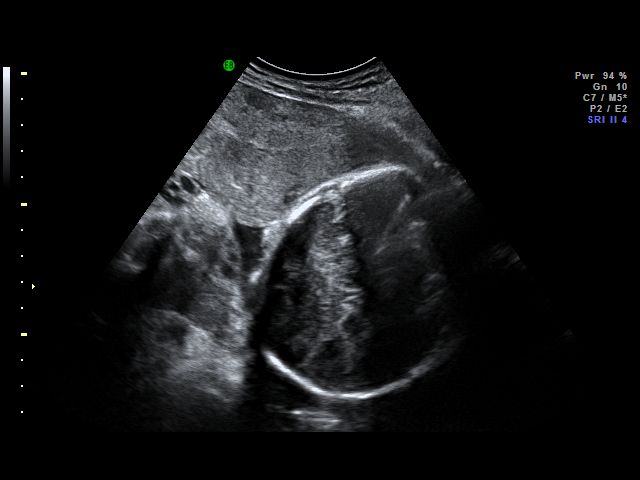
[im 3/32]
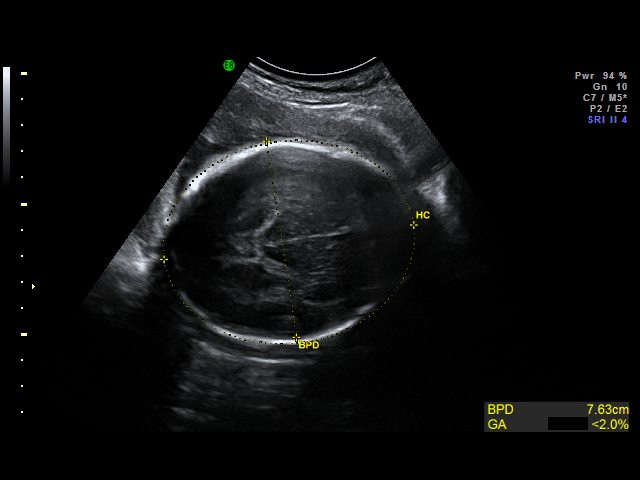
[im 4/32]
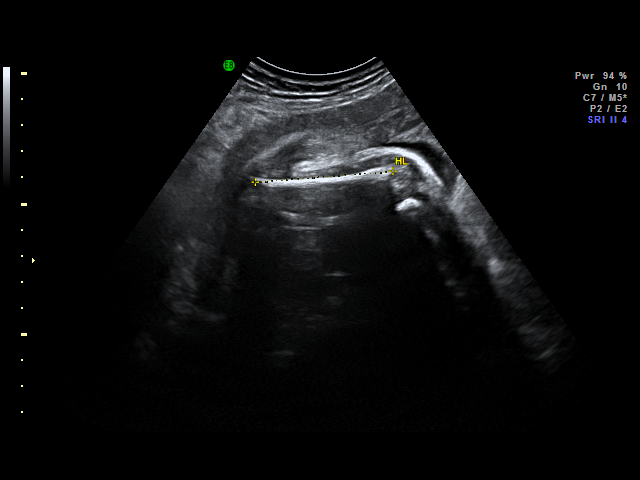
[im 6/32]
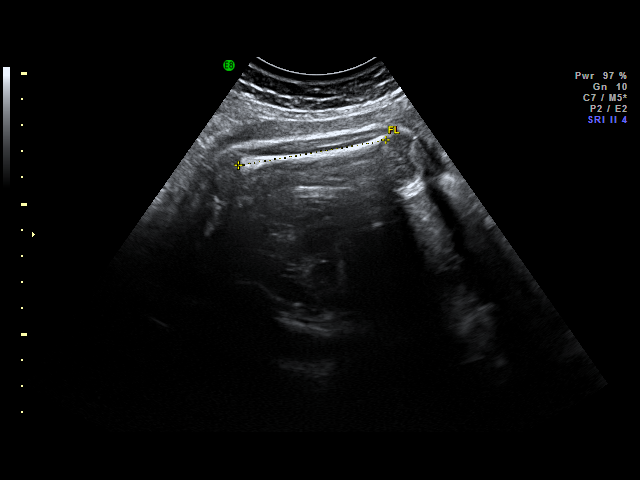
[im 9/32]
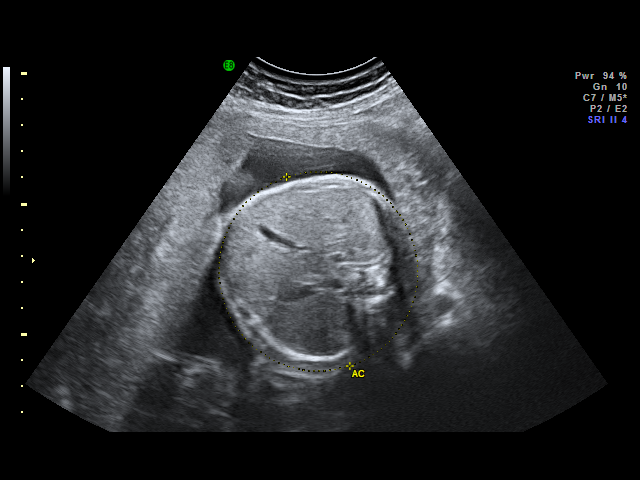
[im 10/32]
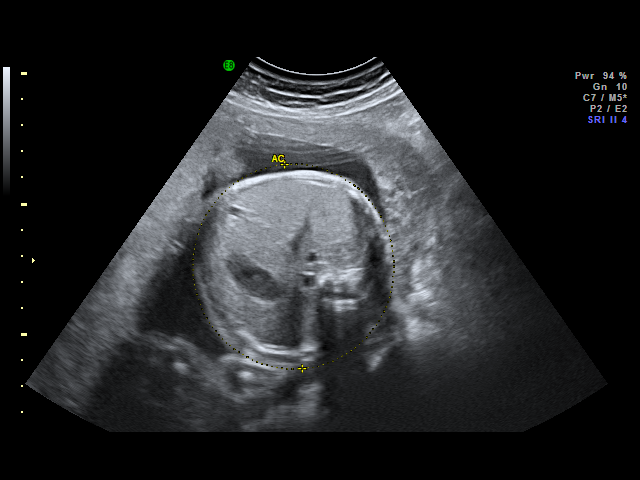
[im 12/32]
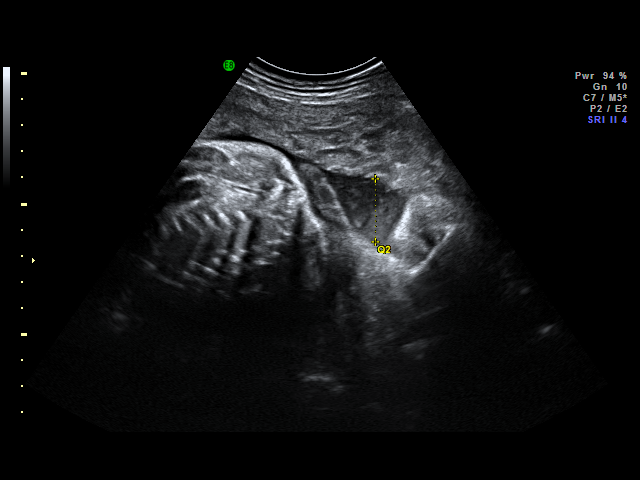
[im 13/32]
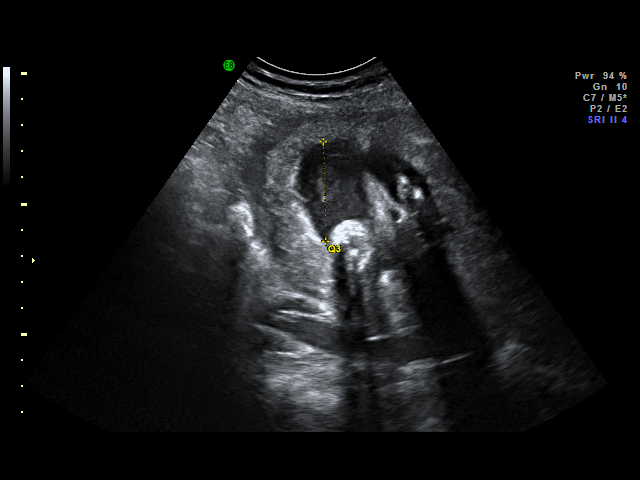
[im 15/32]
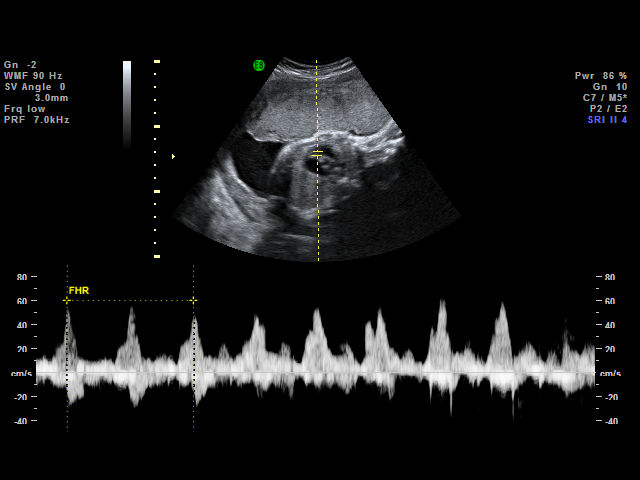
[im 17/32]
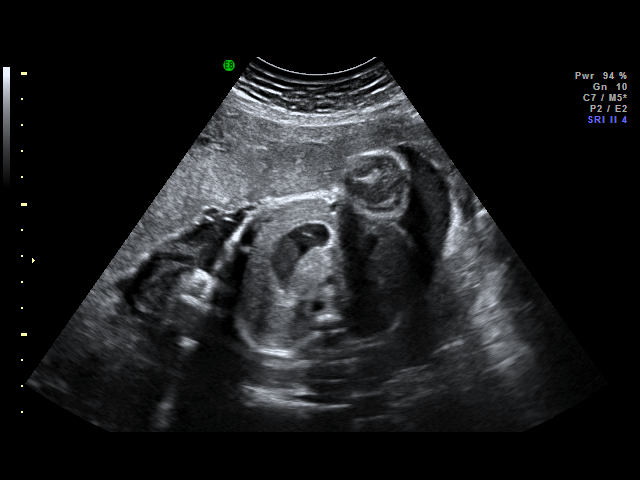
[im 19/32]
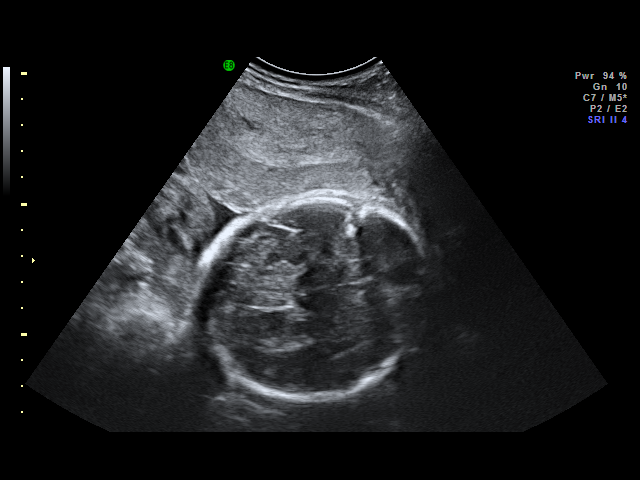
[im 20/32]
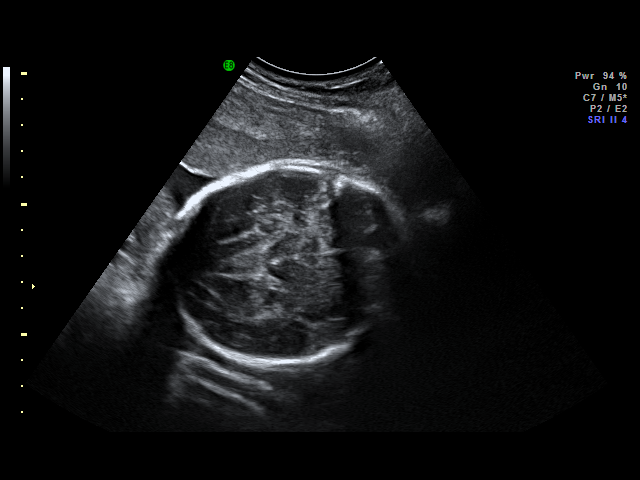
[im 22/32]
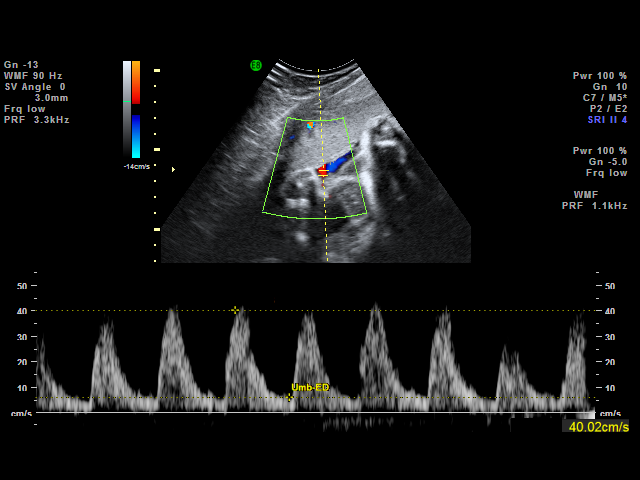
[im 25/32]
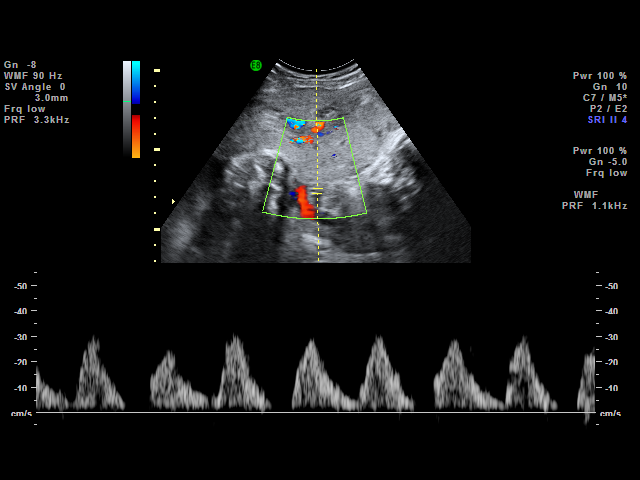
[im 26/32]
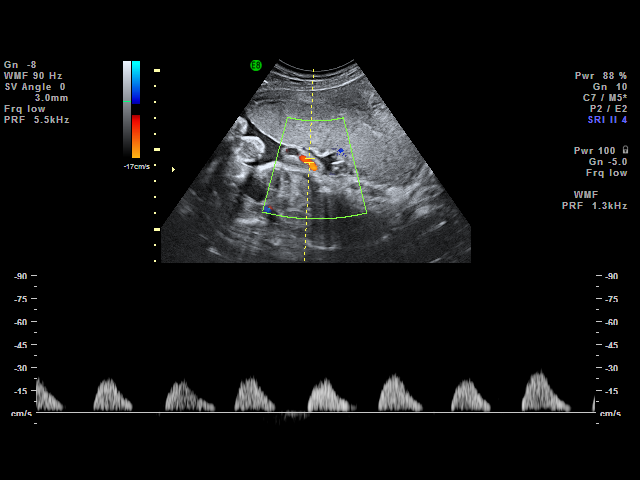
[im 28/32]
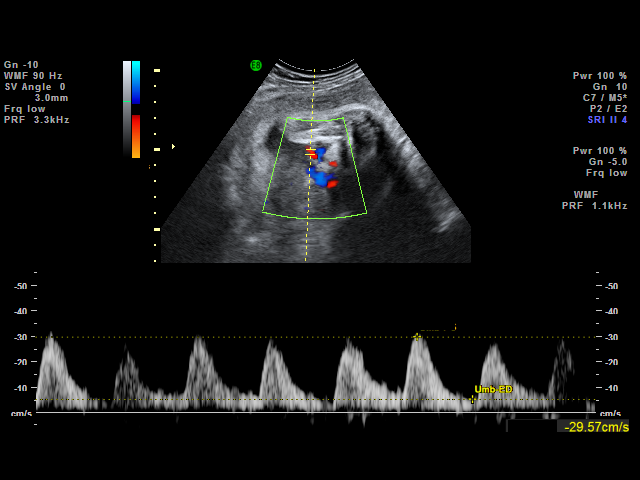
[im 29/32]
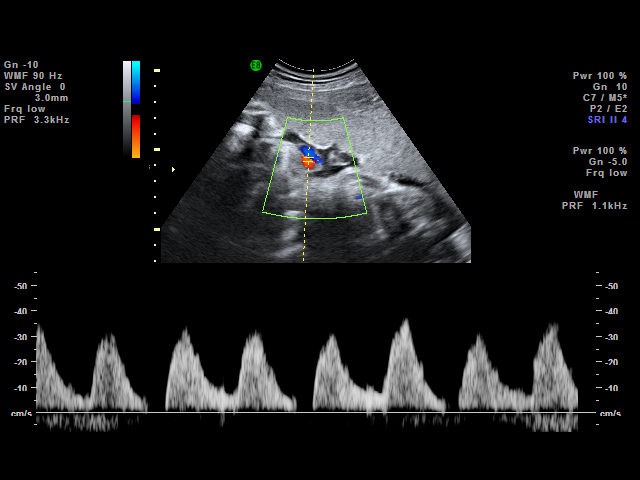
[im 32/32]
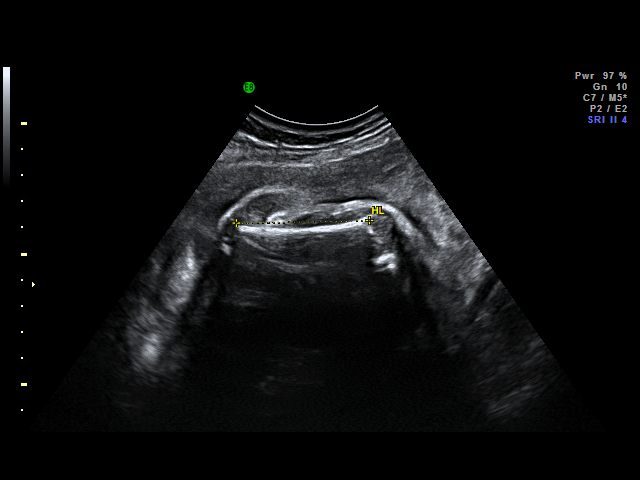

[18 of 28 positions shown; findings below may reference images not displayed]

IMPRESSION: See AS Obstetric US report.

## 2008-01-06 ENCOUNTER — Encounter: Payer: Self-pay | Admitting: *Deleted

## 2008-01-06 IMAGING — US US FETAL BPP W/O NONSTRESS
1 series · 9 of 9 positions shown · non-contrast
Comparison: none

OBSTETRICAL ULTRASOUND:
 This ultrasound was performed in The [HOSPITAL], and the AS OB/GYN report will be stored to [REDACTED] PACS.

[Series 1: us fetal bpp w/o nonstress · 9 acquisitions, 9 frames shown]
[im 1/9]
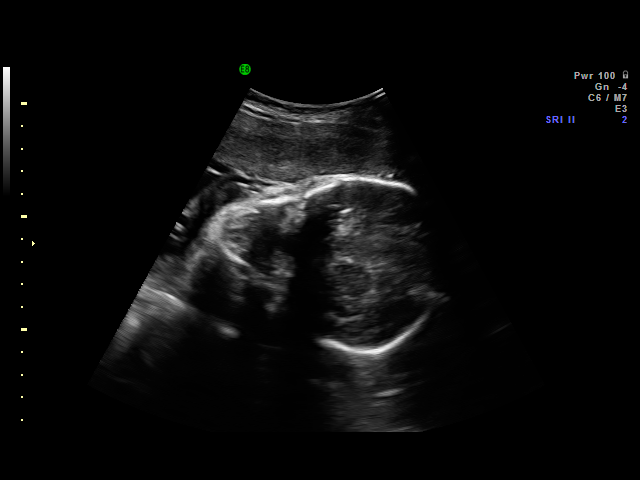
[im 2/9]
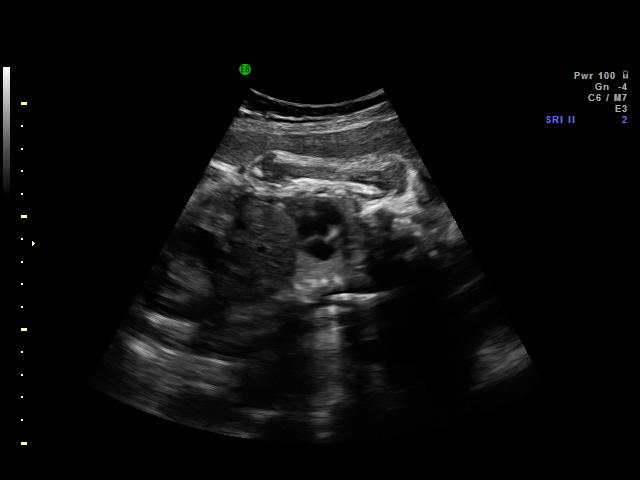
[im 3/9]
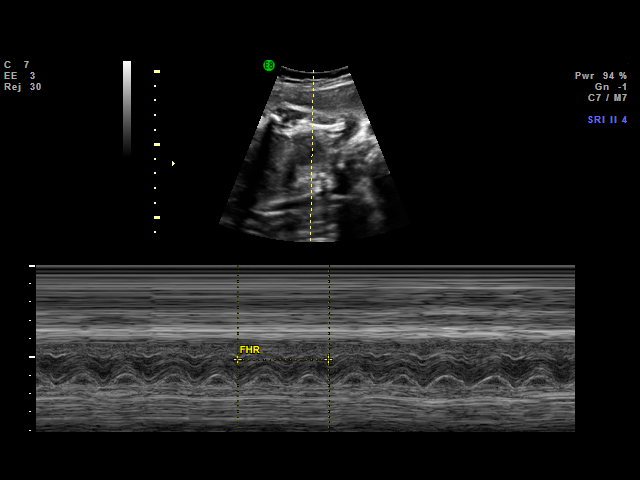
[im 4/9]
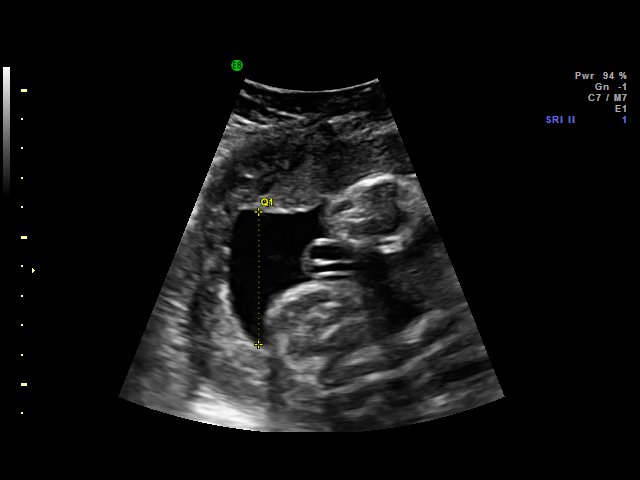
[im 5/9]
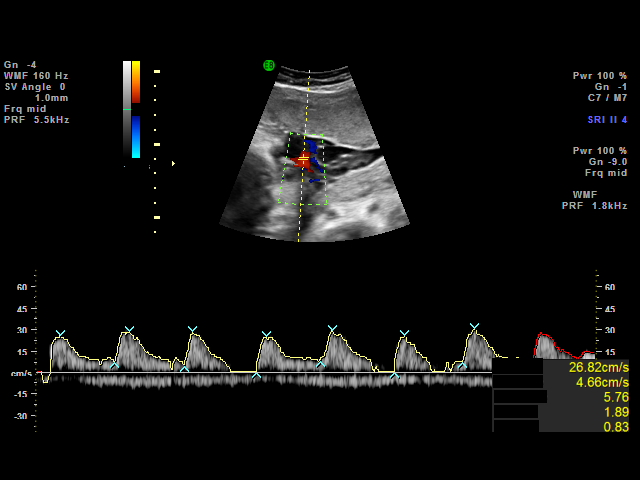
[im 6/9]
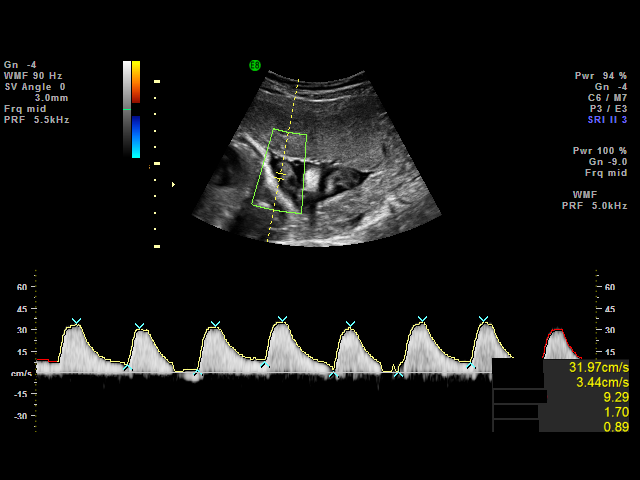
[im 7/9]
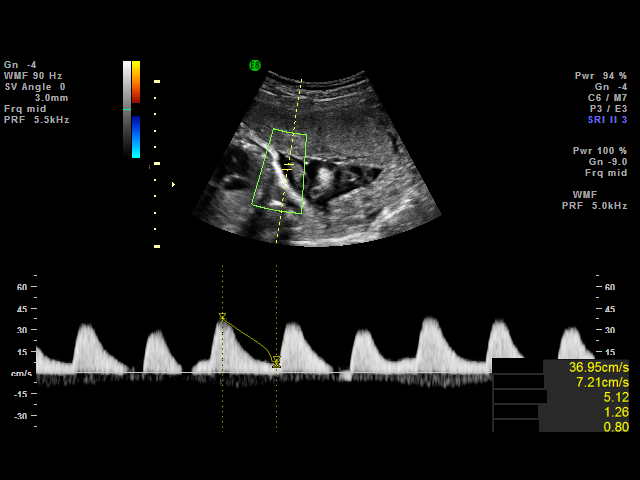
[im 8/9]
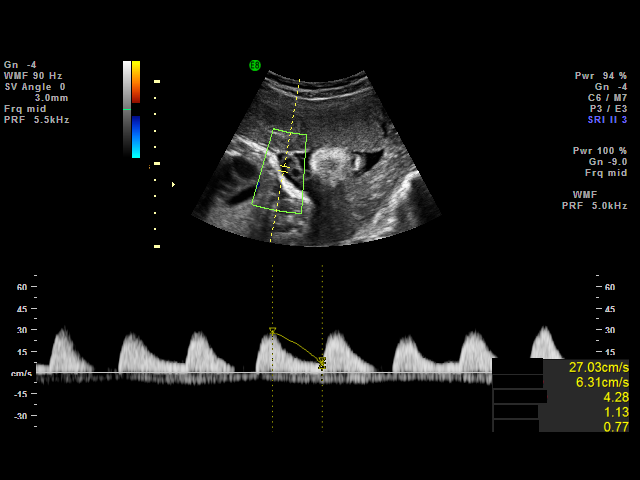
[im 9/9]
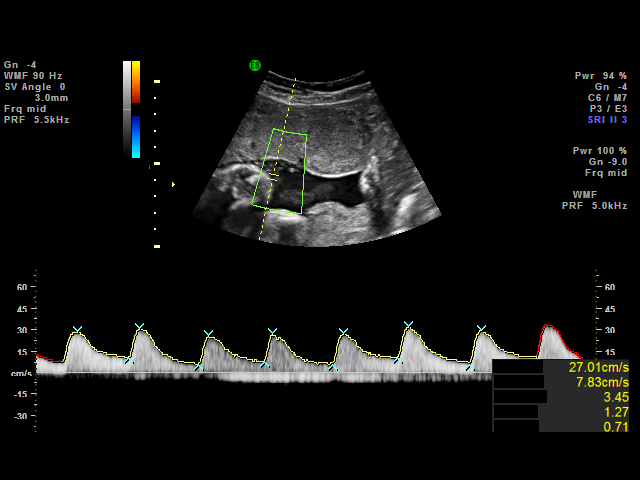

[9 of 9 positions shown; findings below may reference images not displayed]

IMPRESSION: AS OB/GYN has also been faxed to the ordering physician.

## 2008-01-07 IMAGING — US US OB UMBILICAL ART DOPPLER
1 series · 13 of 13 positions shown · non-contrast
Comparison: none

OBSTETRICAL ULTRASOUND:
 This ultrasound exam was performed in the [HOSPITAL] Ultrasound Department.  The OB US report was generated in the AS system, and faxed to the ordering physician.  This report is also available in [REDACTED] PACS.

[Series 1: us fetal bpp w/o nonstress · non-contrast · 13 acquisitions, 13 frames shown]
[im 1/13]
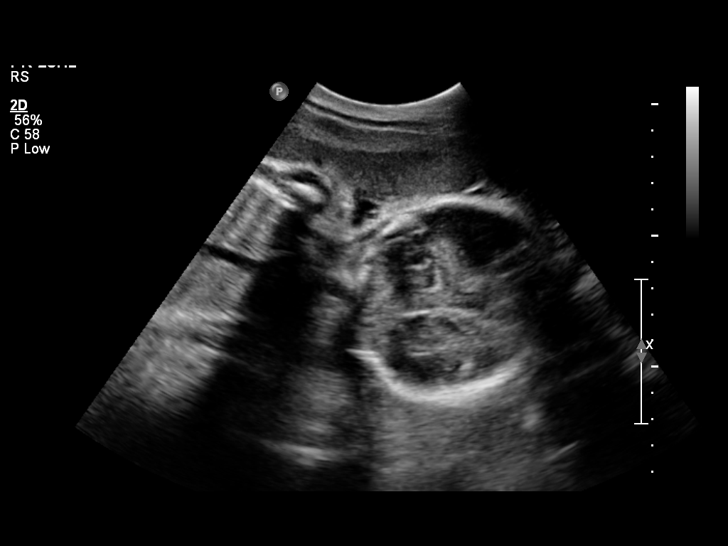
[im 2/13]
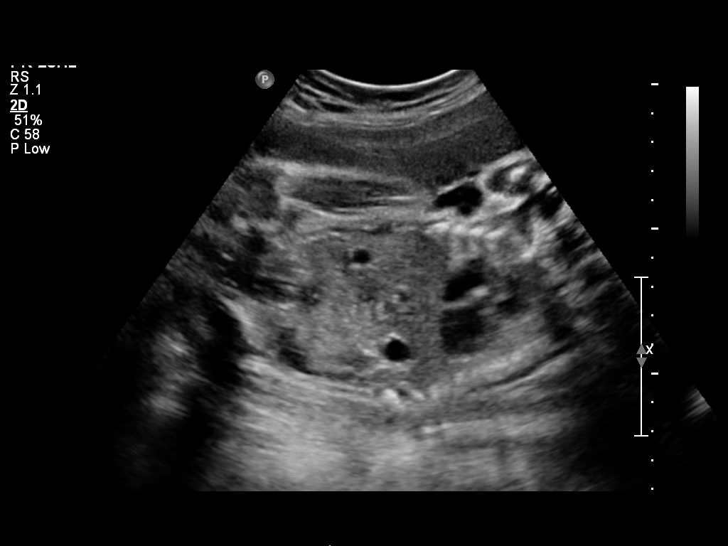
[im 3/13]
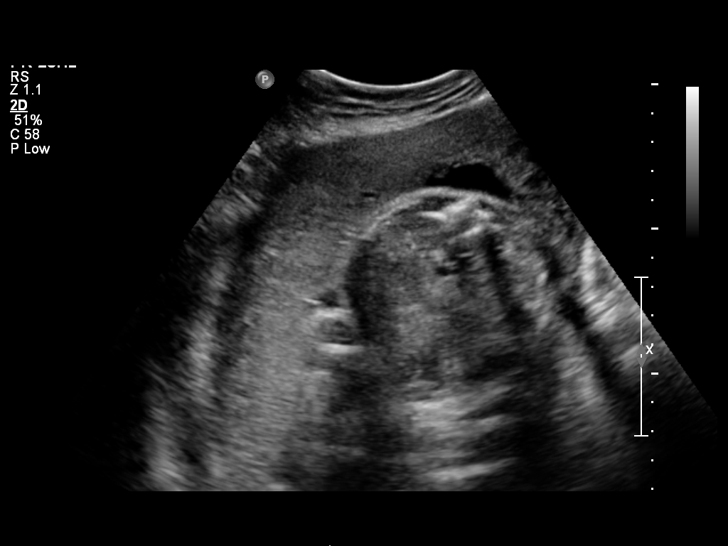
[im 4/13]
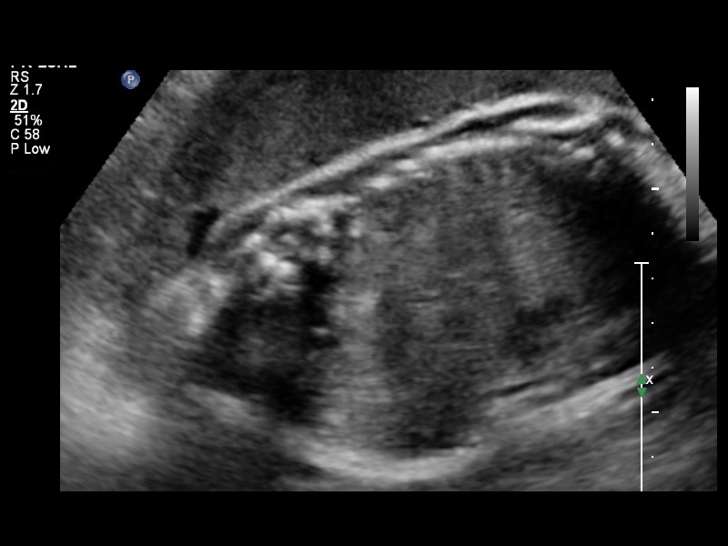
[im 5/13]
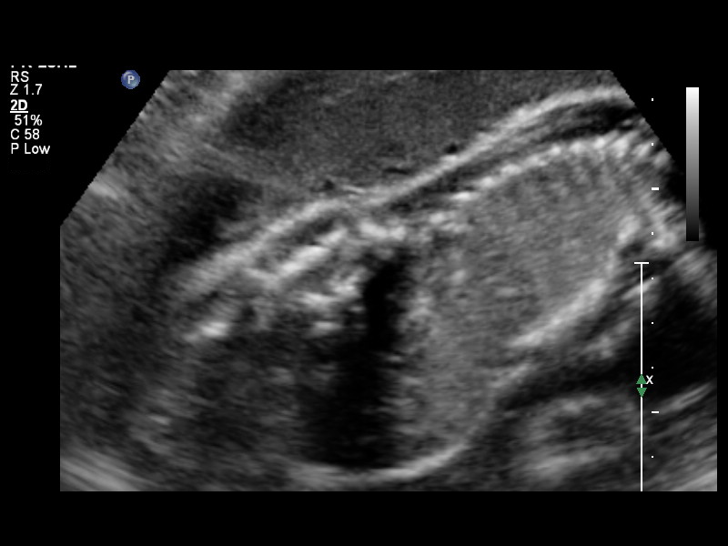
[im 6/13]
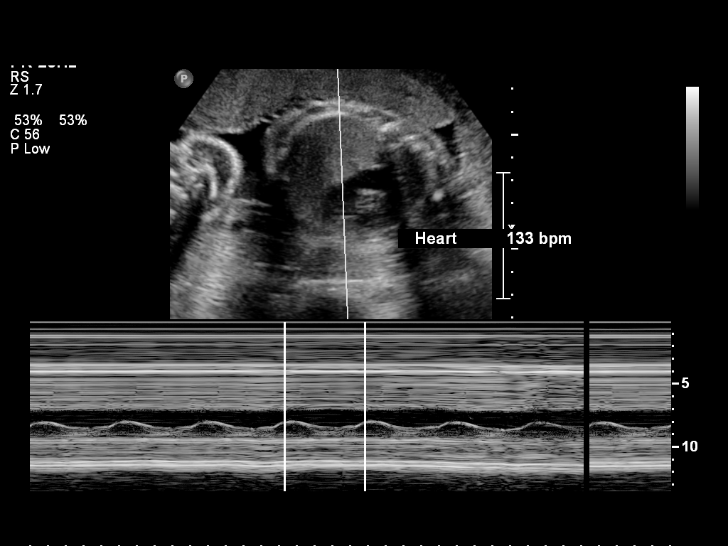
[im 7/13]
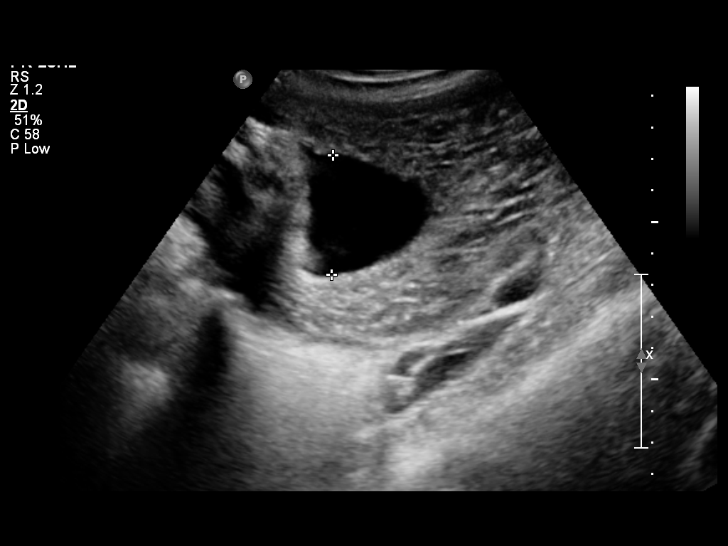
[im 8/13]
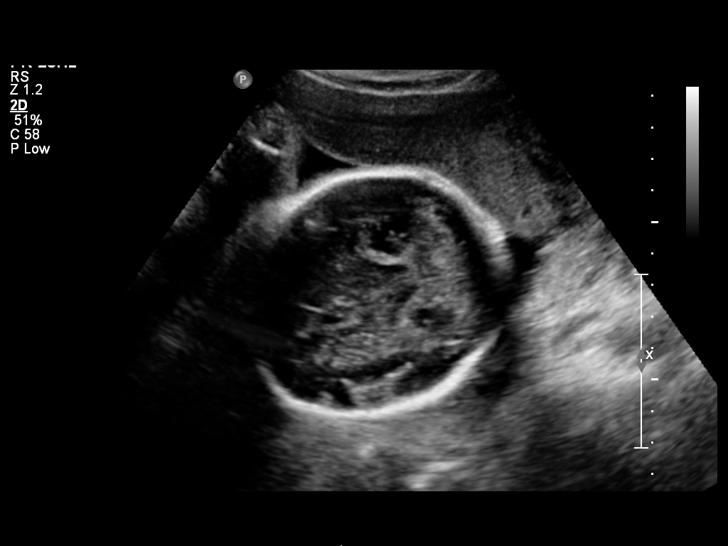
[im 9/13]
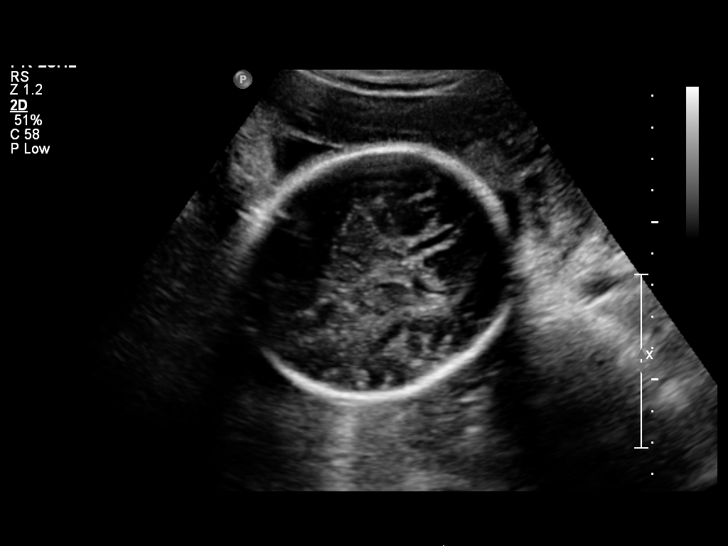
[im 10/13]
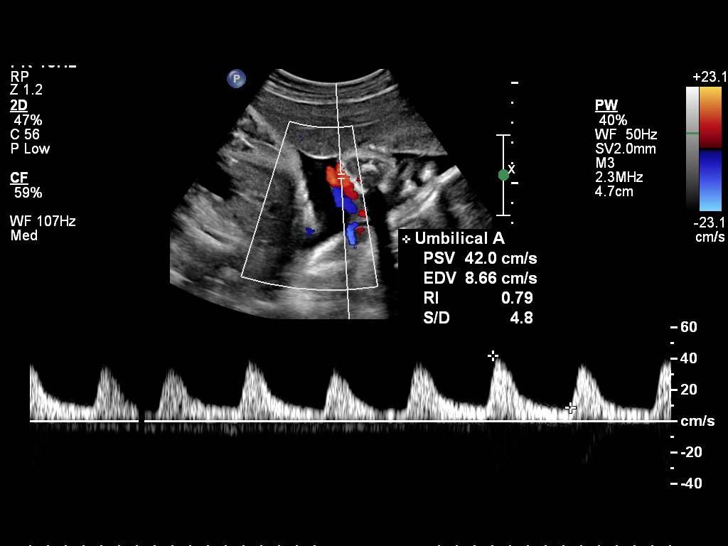
[im 11/13]
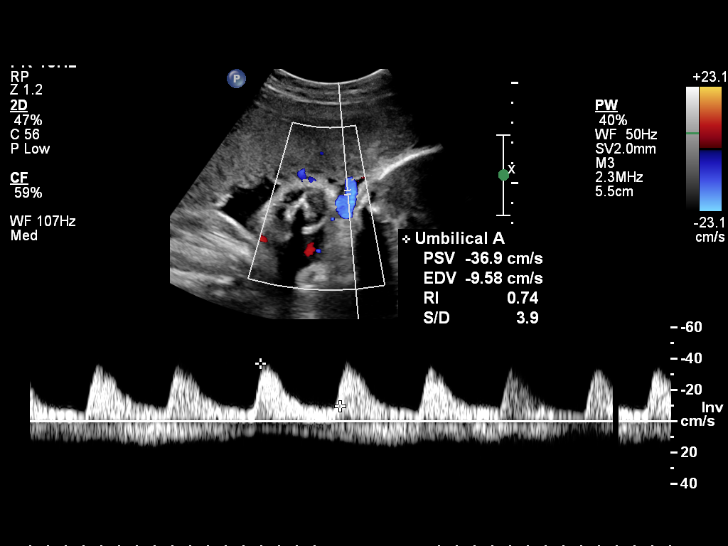
[im 12/13]
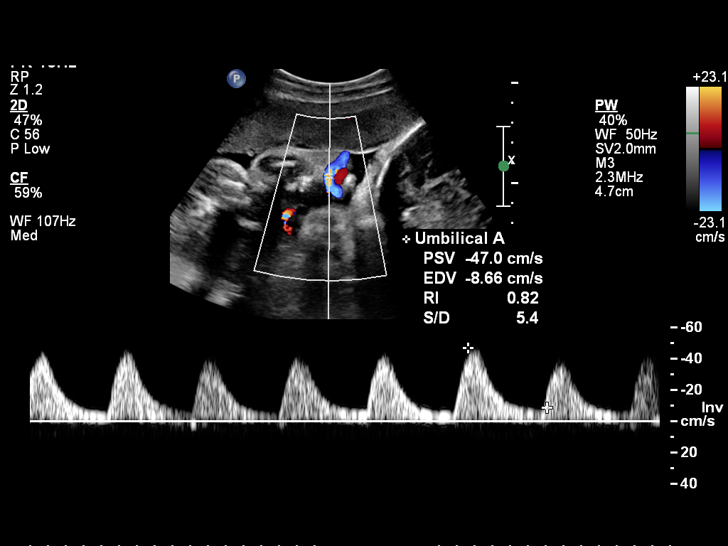
[im 13/13]
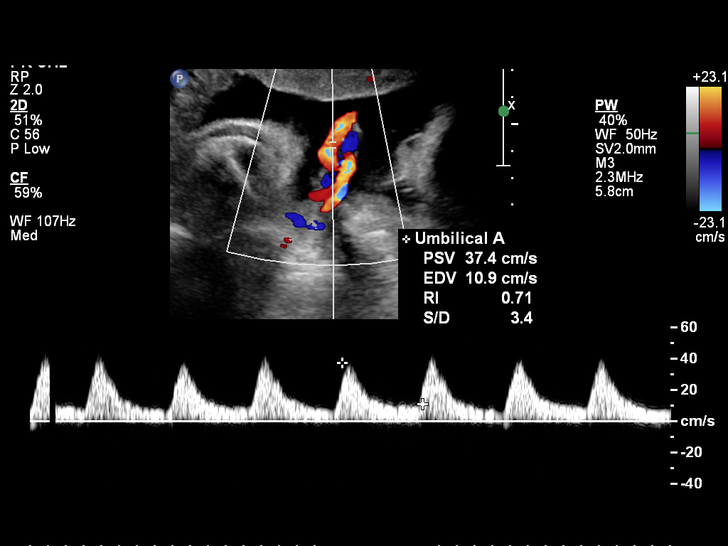

[13 of 13 positions shown; findings below may reference images not displayed]

IMPRESSION: See AS Obstetric US report.

## 2008-01-08 IMAGING — US US UA DOPPLER RE-EVAL
1 series · 11 of 11 positions shown · non-contrast
Comparison: none

OBSTETRICAL ULTRASOUND:
 This ultrasound exam was performed in the [HOSPITAL] Ultrasound Department.  The OB US report was generated in the AS system, and faxed to the ordering physician.  This report is also available in [REDACTED] PACS.

[Series 1: us ua doppler re-eval · 0.31mm/px · 11 acquisitions, 11 frames shown]
[im 1/11]
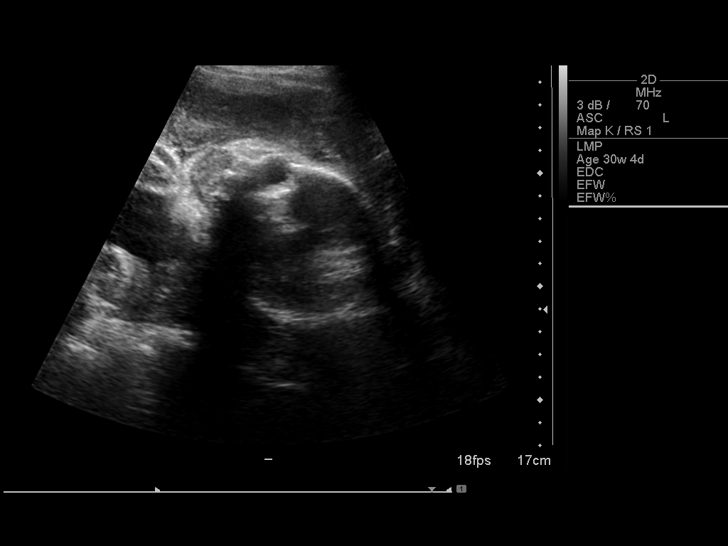
[im 2/11]
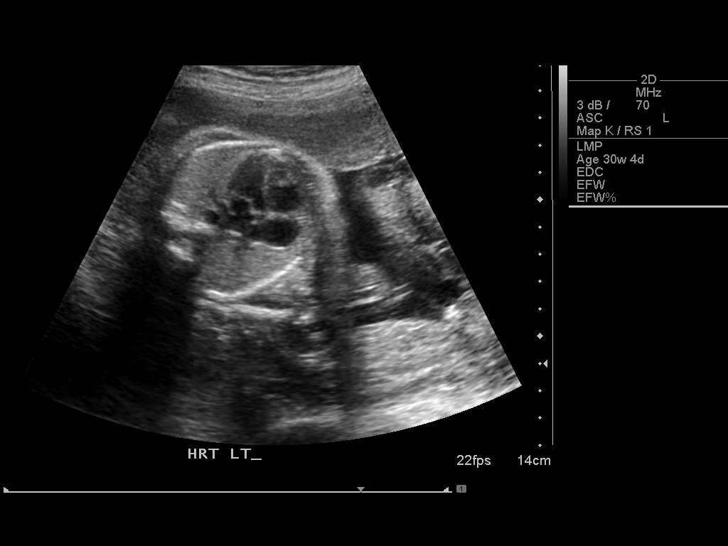
[im 3/11]
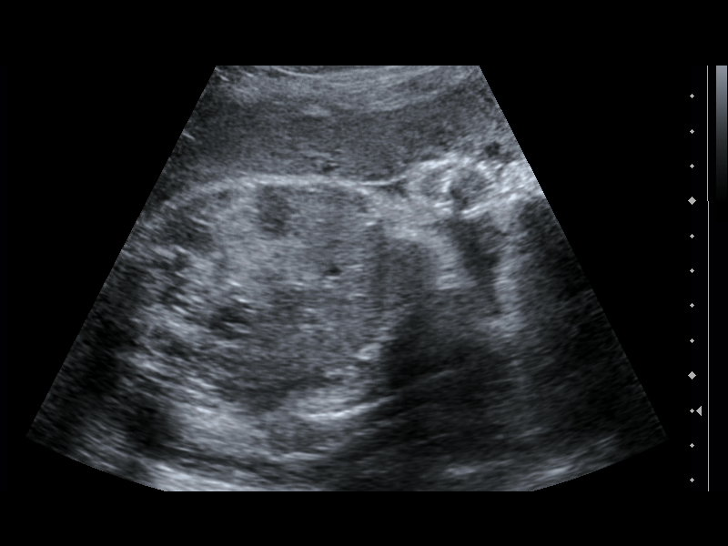
[im 4/11]
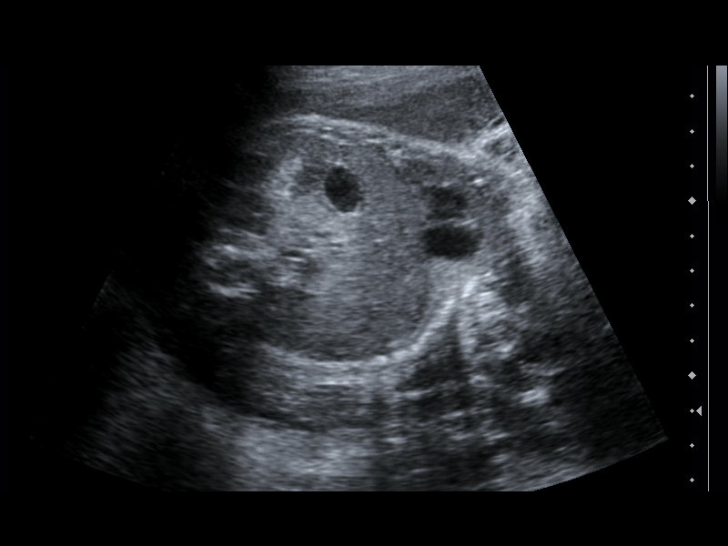
[im 5/11]
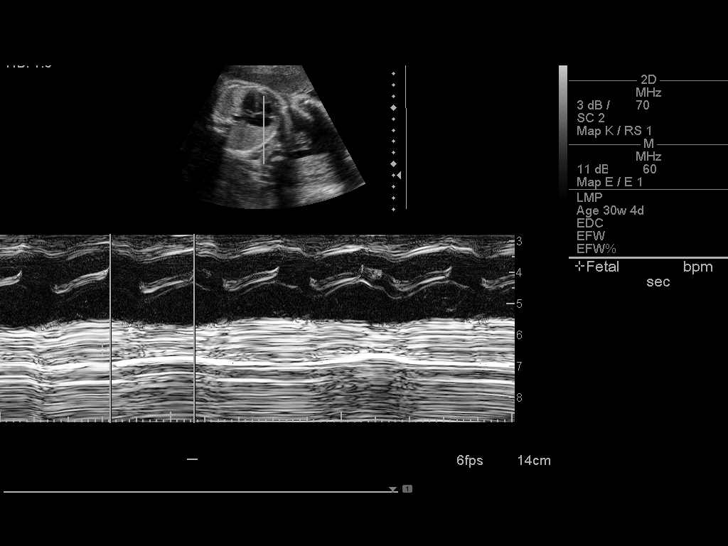
[im 6/11]
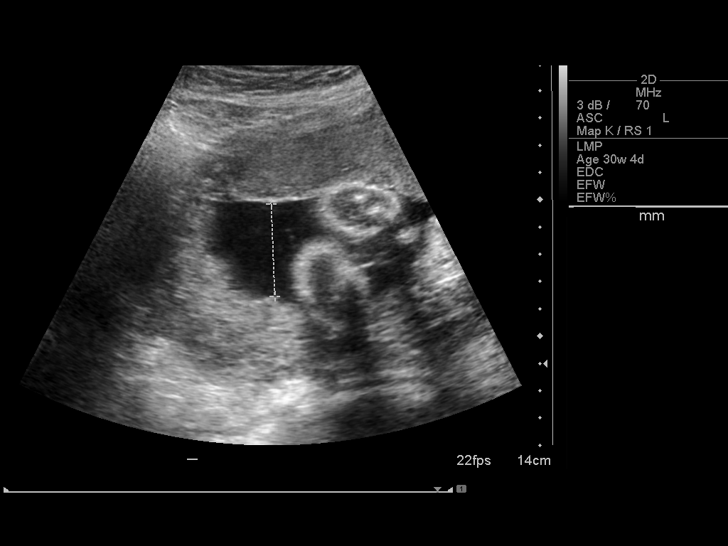
[im 7/11]
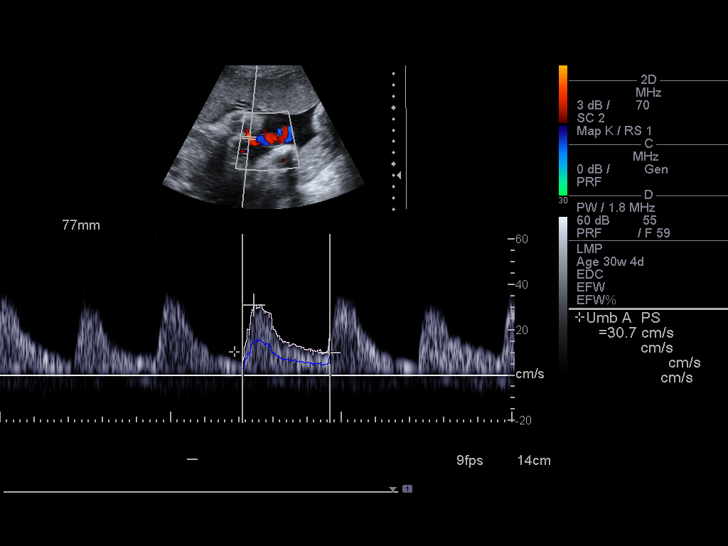
[im 8/11]
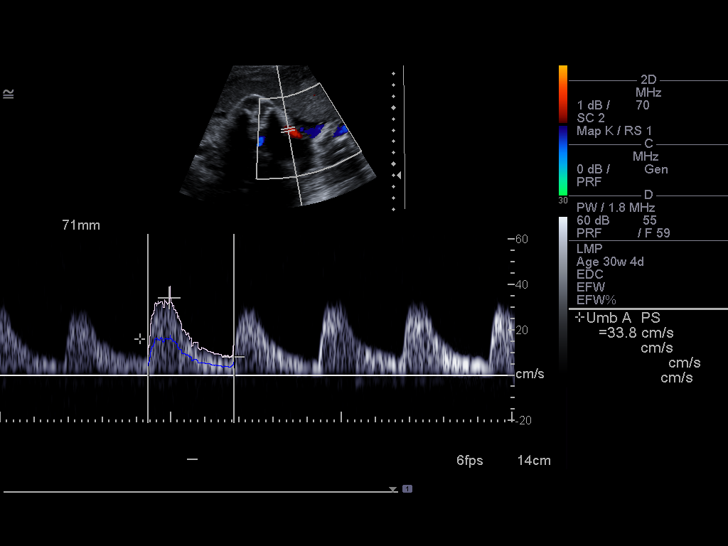
[im 9/11]
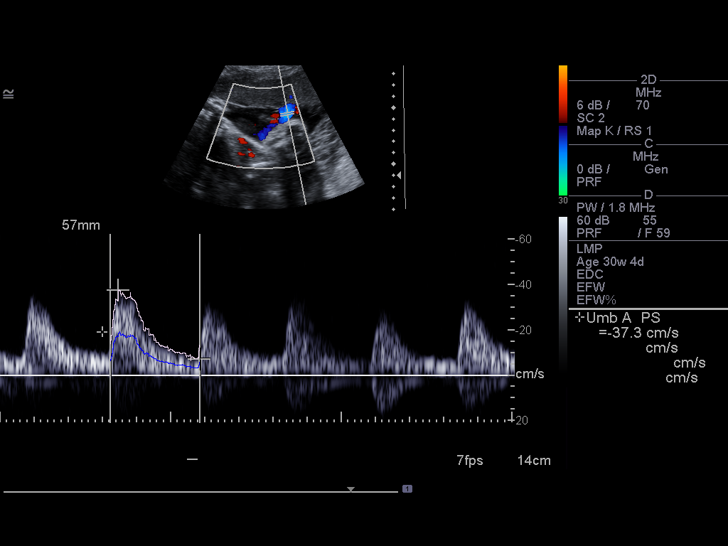
[im 10/11]
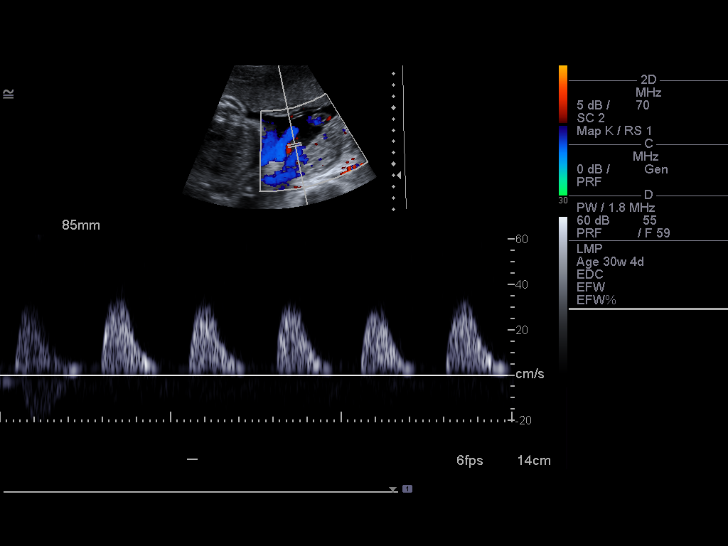
[im 11/11]
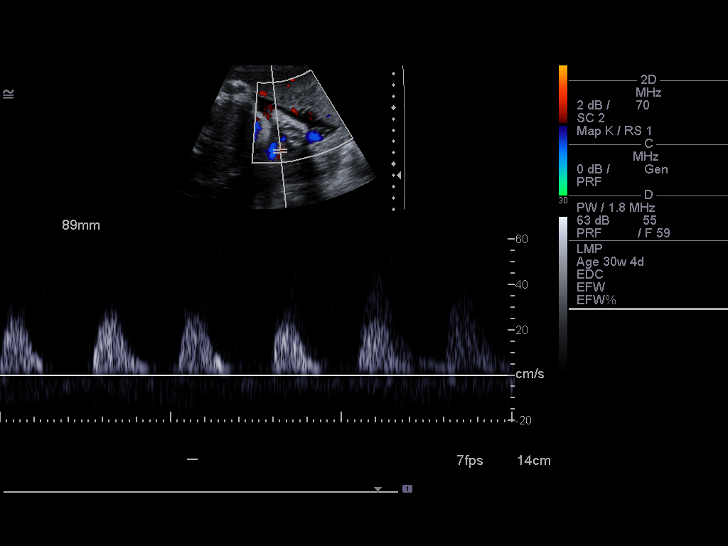

[11 of 11 positions shown; findings below may reference images not displayed]

IMPRESSION: See AS Obstetric US report.

## 2008-01-09 ENCOUNTER — Encounter: Payer: Self-pay | Admitting: *Deleted

## 2008-01-09 IMAGING — US US FETAL BPP W/O NONSTRESS
1 series · 18 of 25 positions shown · non-contrast
Comparison: none

OBSTETRICAL ULTRASOUND:
 This ultrasound was performed in The [HOSPITAL], and the AS OB/GYN report will be stored to [REDACTED] PACS.

[Series 1: us fetal bpp w/o nonstress · 18 of 25 slices shown]
[im 1/25]
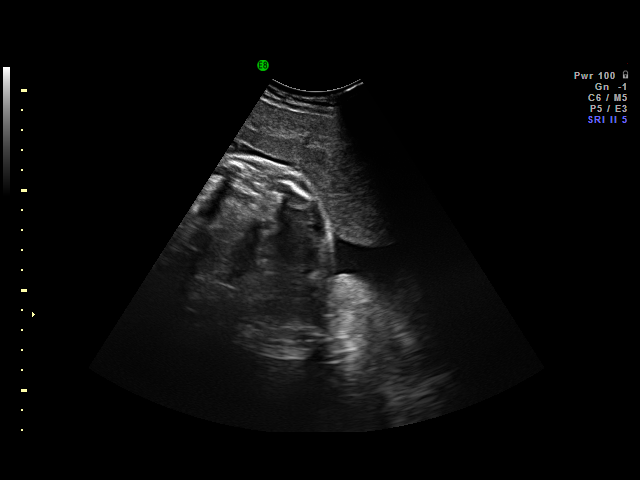
[im 3/25]
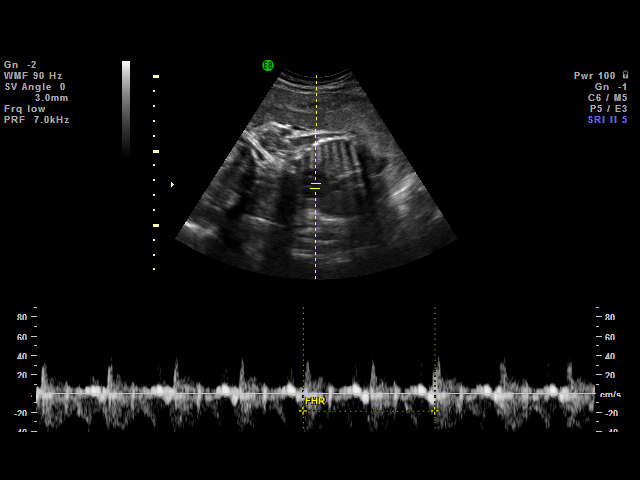
[im 4/25]
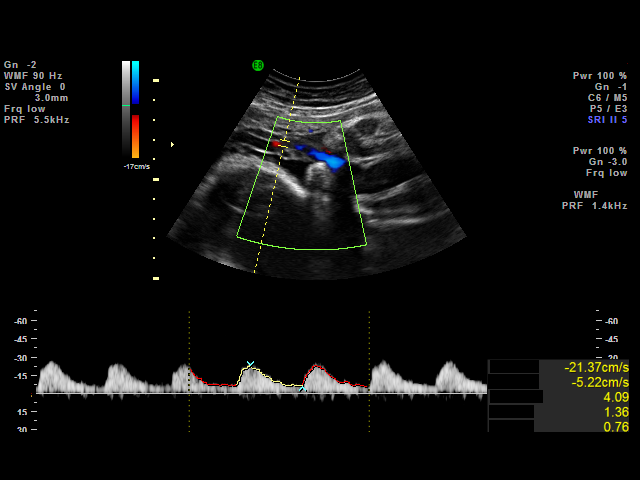
[im 5/25]
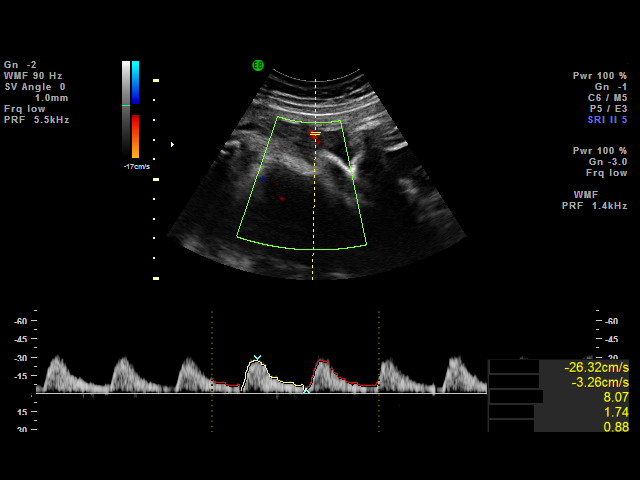
[im 7/25]
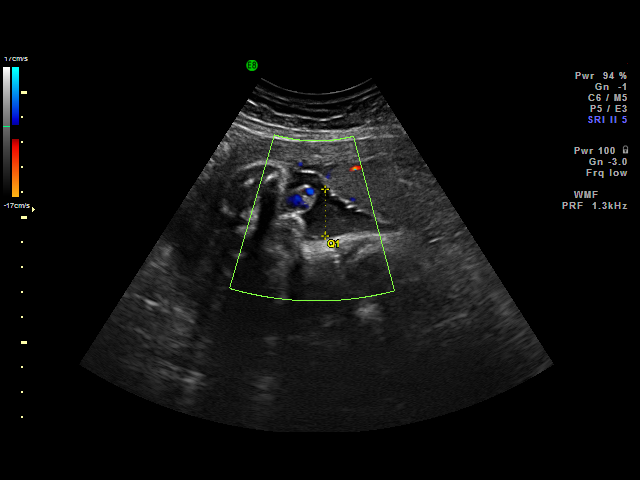
[im 8/25]
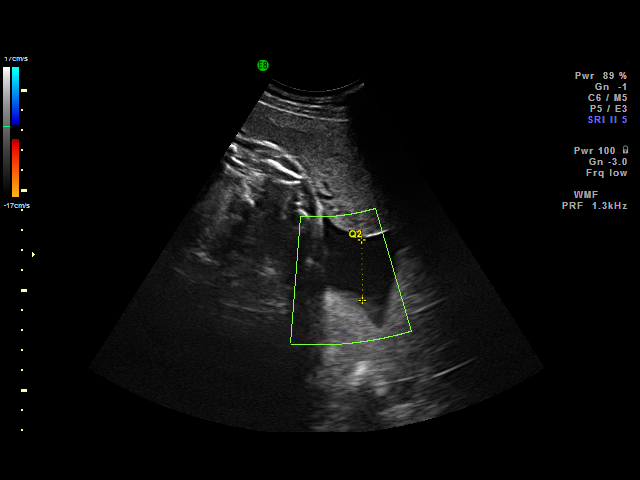
[im 10/25]
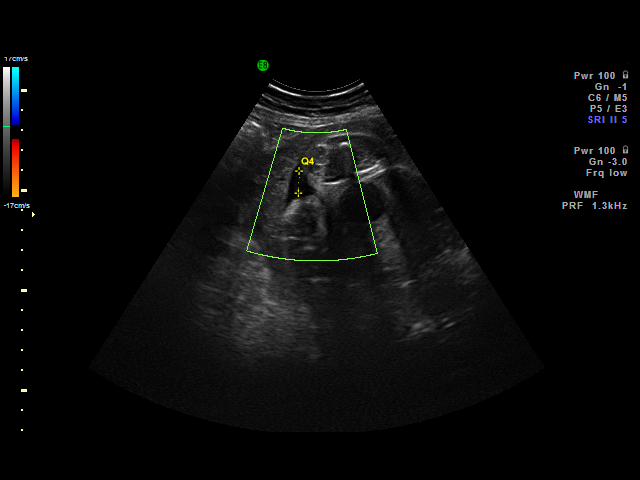
[im 11/25]
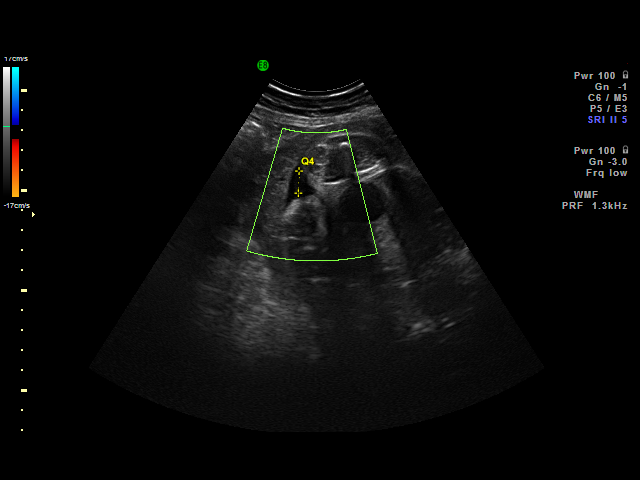
[im 12/25]
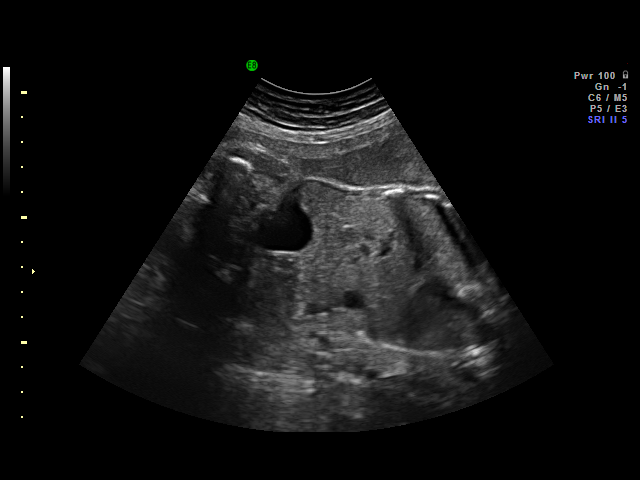
[im 14/25]
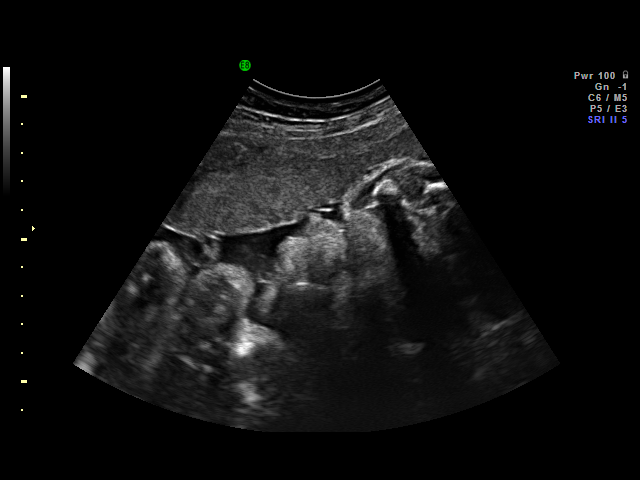
[im 15/25]
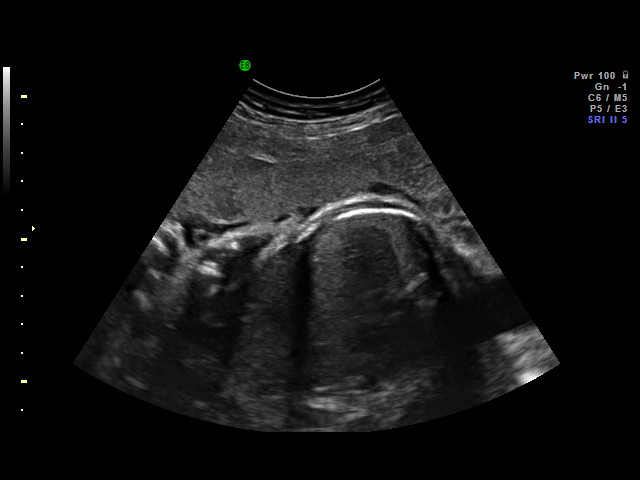
[im 16/25]
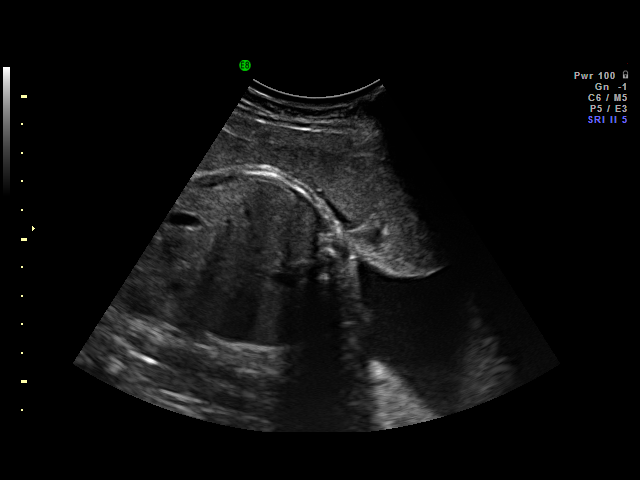
[im 18/25]
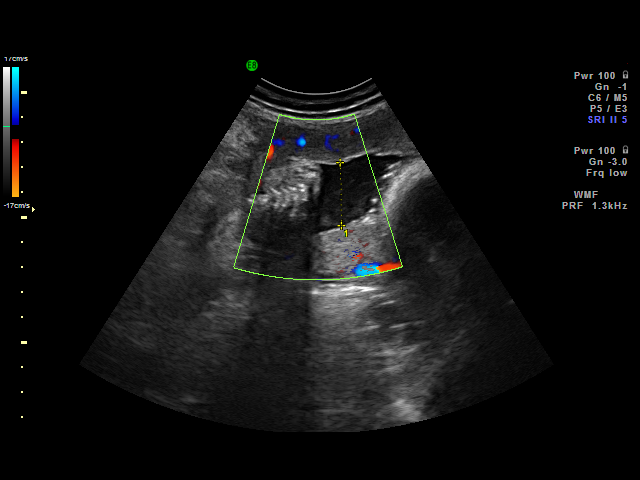
[im 19/25]
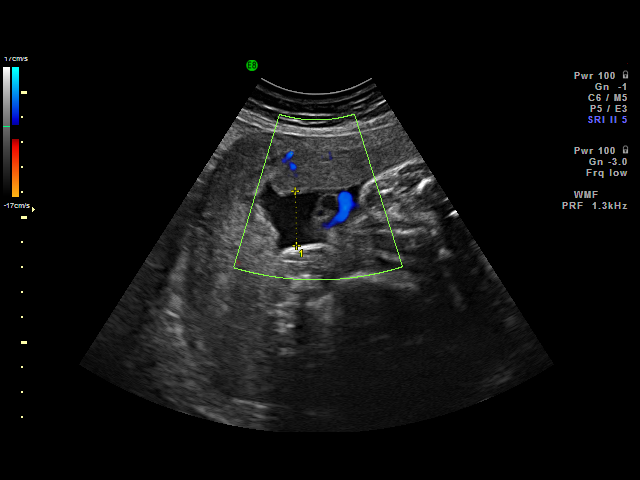
[im 21/25]
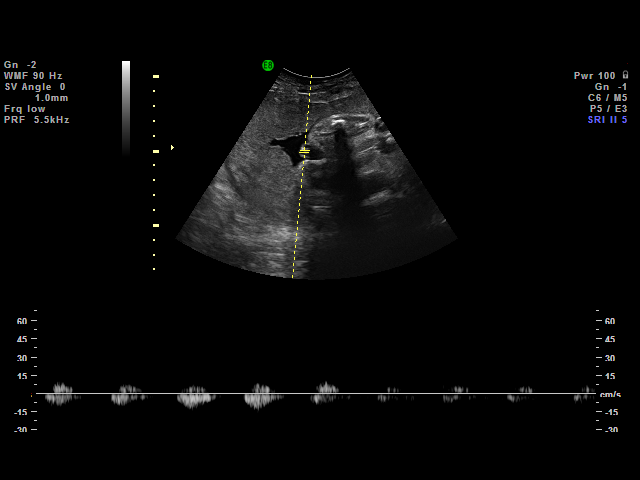
[im 22/25]
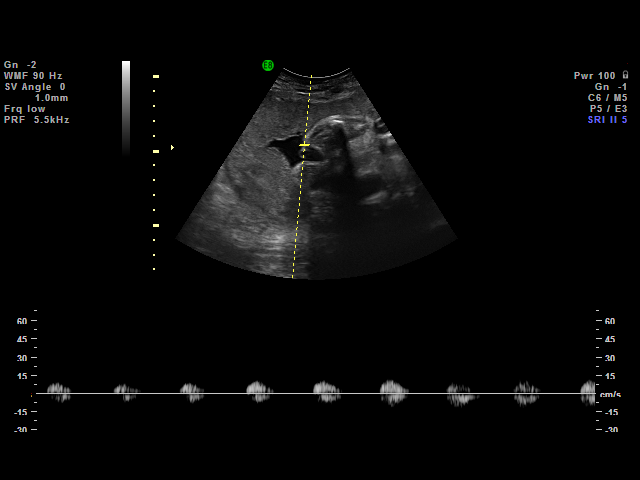
[im 23/25]
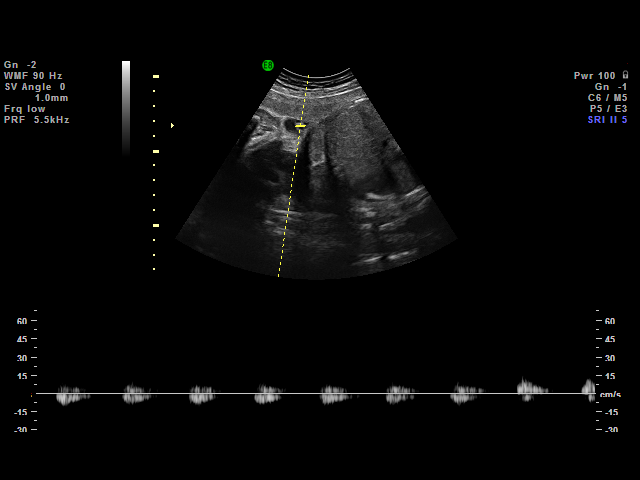
[im 25/25]
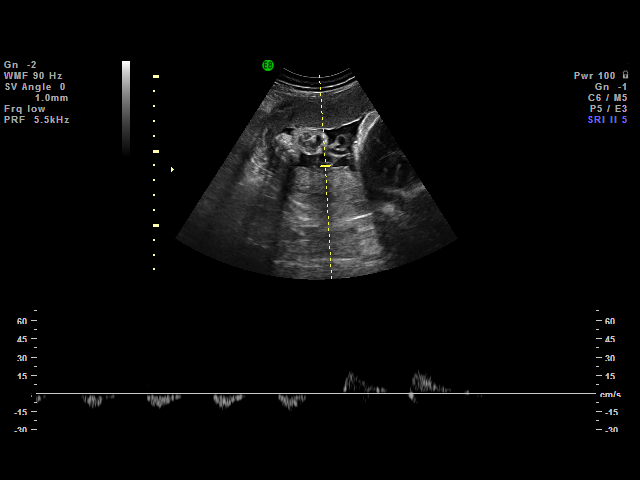

[18 of 25 positions shown; findings below may reference images not displayed]

IMPRESSION: AS OB/GYN has also been faxed to the ordering physician.

## 2008-01-10 ENCOUNTER — Encounter: Payer: Self-pay | Admitting: *Deleted

## 2008-01-10 IMAGING — US US UA DOPPLER RE-EVAL
1 series · 18 of 20 positions shown · non-contrast
Comparison: none

OBSTETRICAL ULTRASOUND:
 This ultrasound was performed in The [HOSPITAL], and the AS OB/GYN report will be stored to [REDACTED] PACS.

[Series 1: us ua doppler re-eval · 20 acquisitions, 18 frames shown]
[im 1/20]
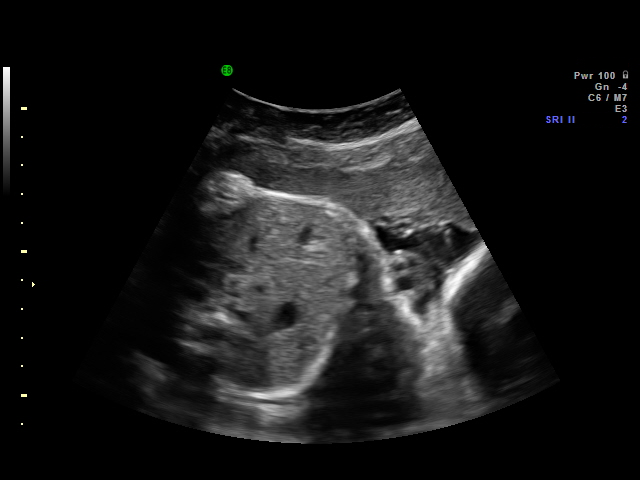
[im 2/20]
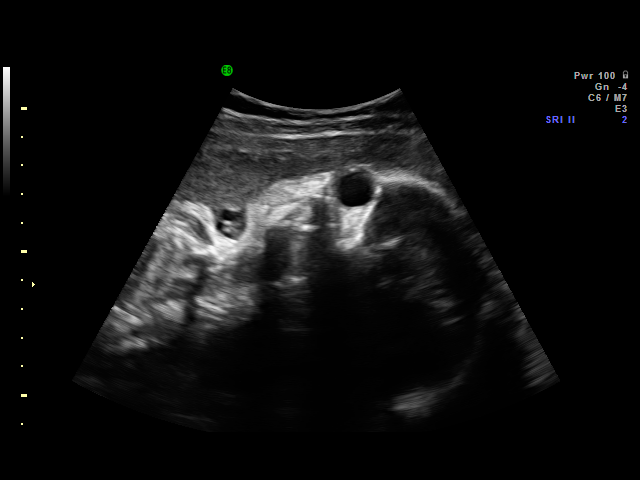
[im 3/20]
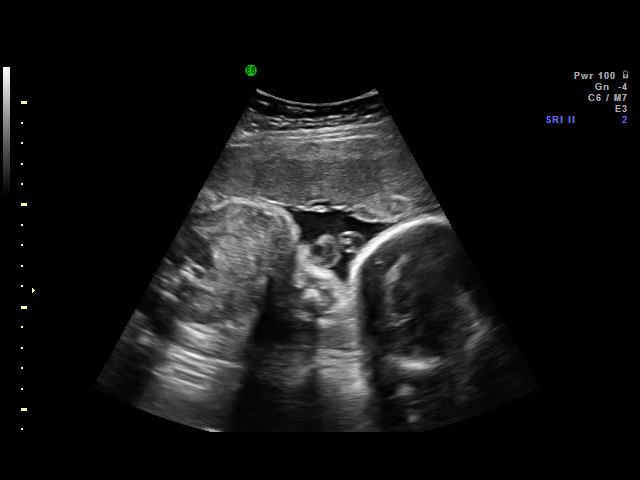
[im 4/20]
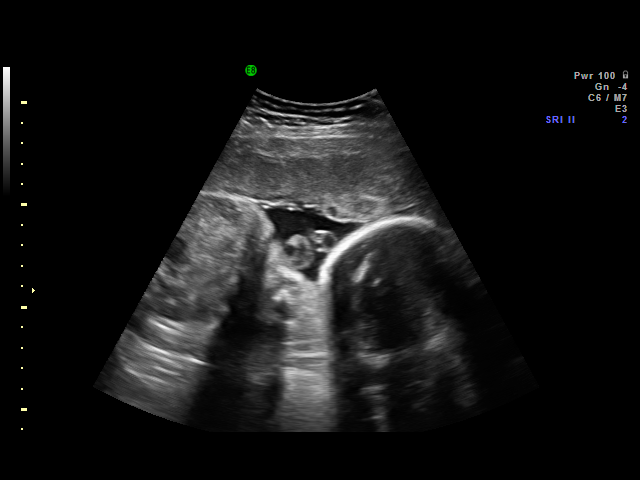
[im 6/20]
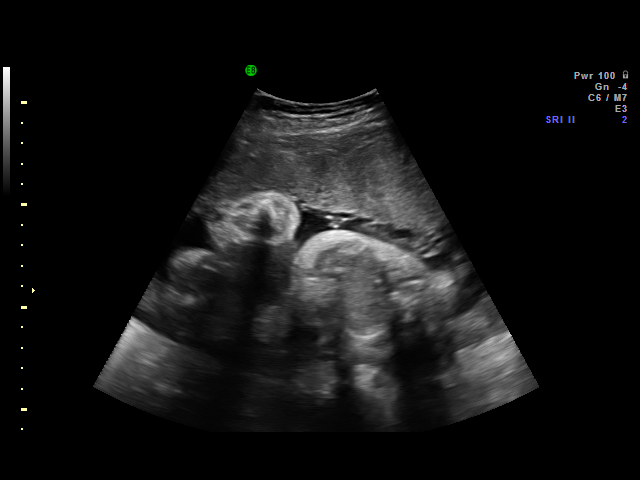
[im 7/20]
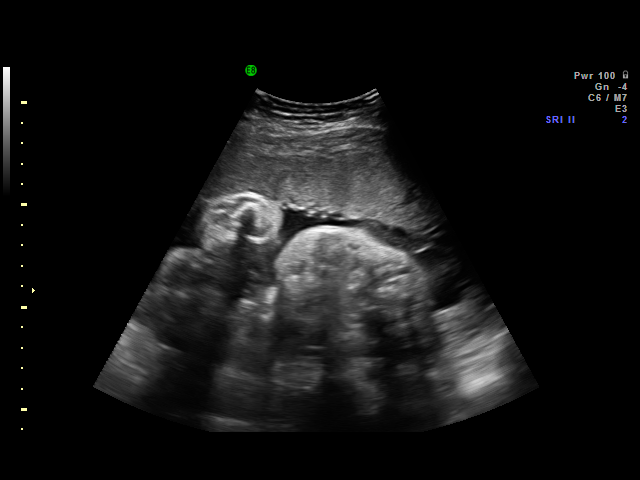
[im 8/20]
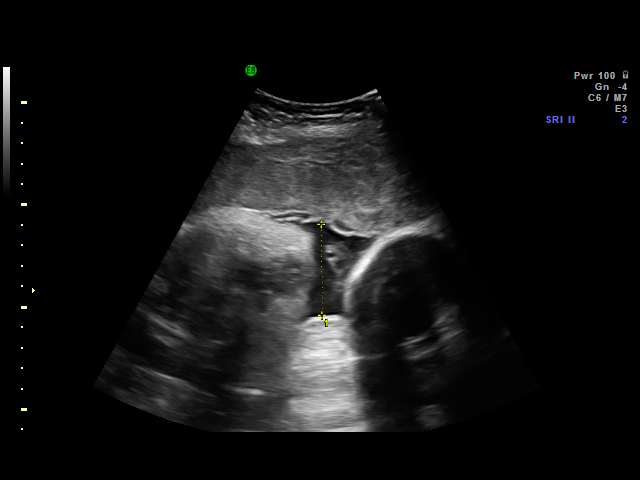
[im 9/20]
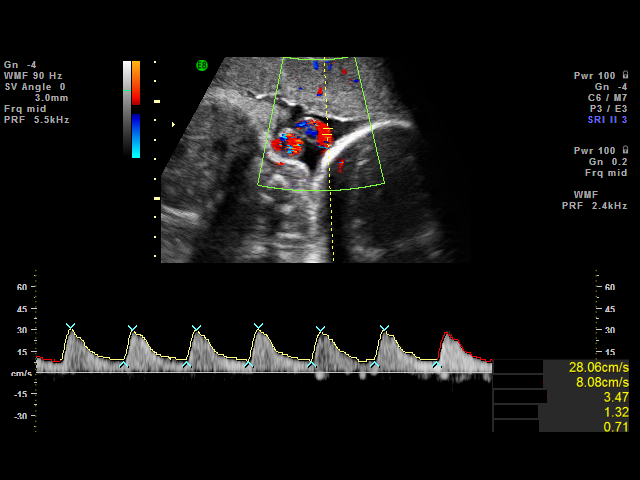
[im 10/20]
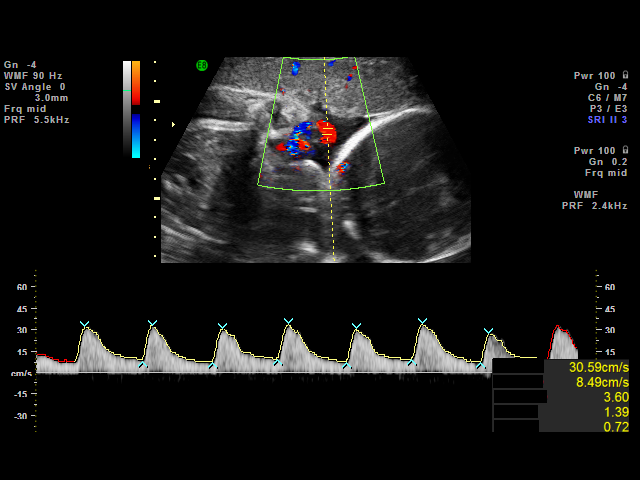
[im 11/20]
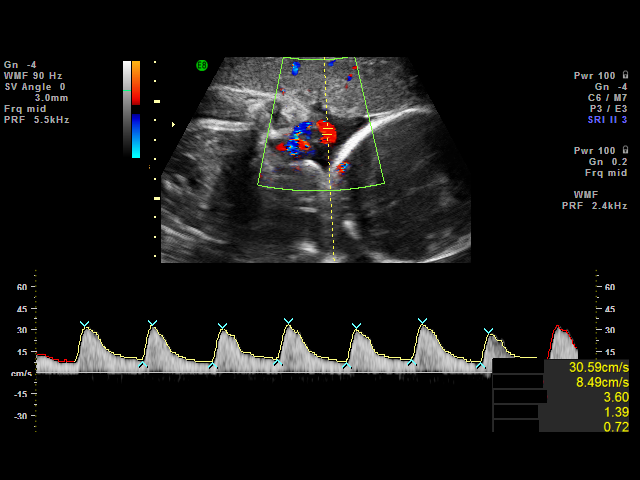
[im 12/20]
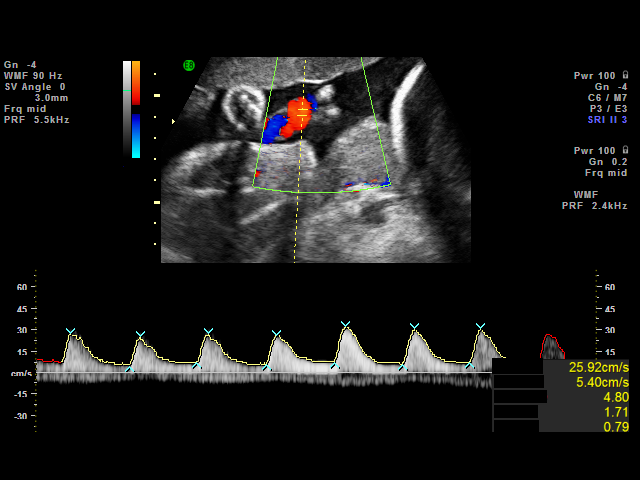
[im 13/20]
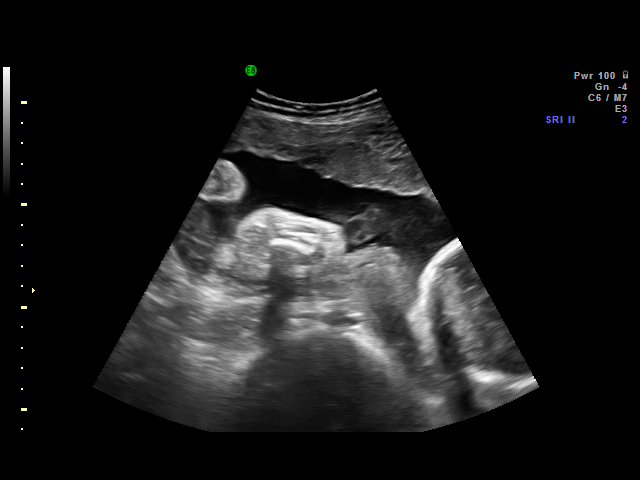
[im 14/20]
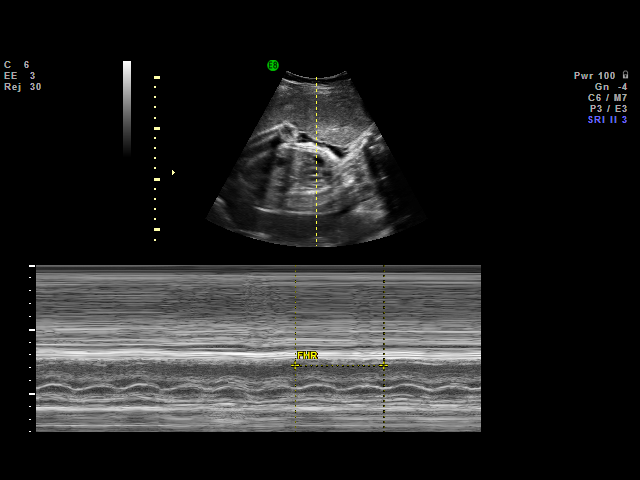
[im 16/20]
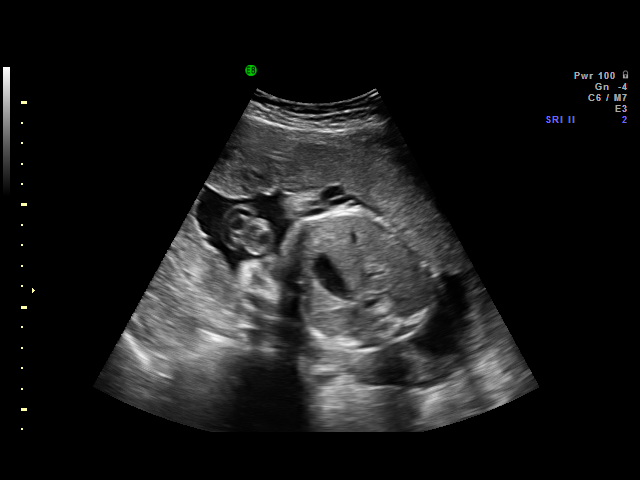
[im 17/20]
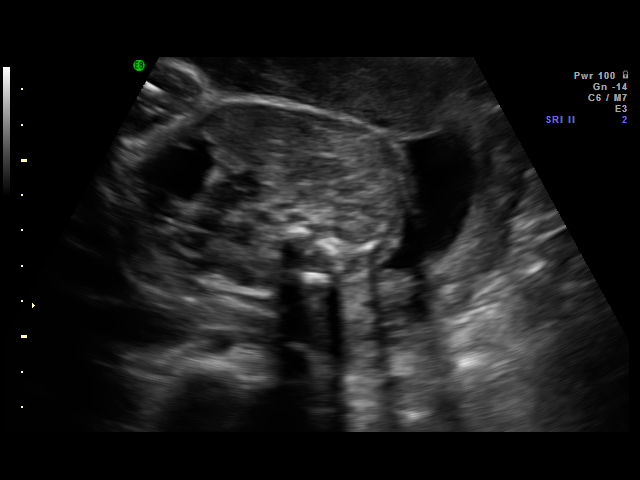
[im 18/20]
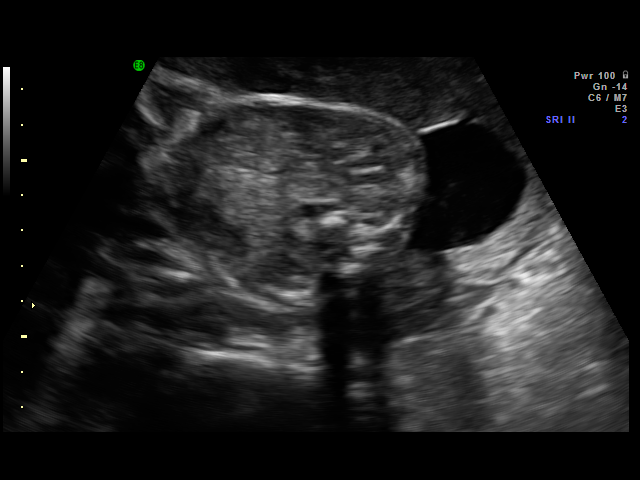
[im 19/20]
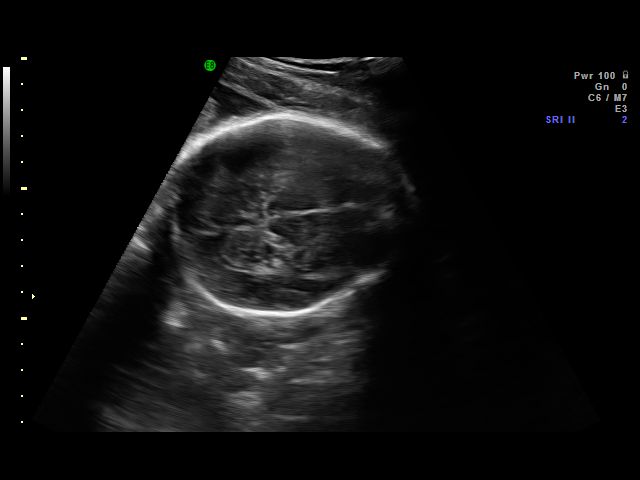
[im 20/20]
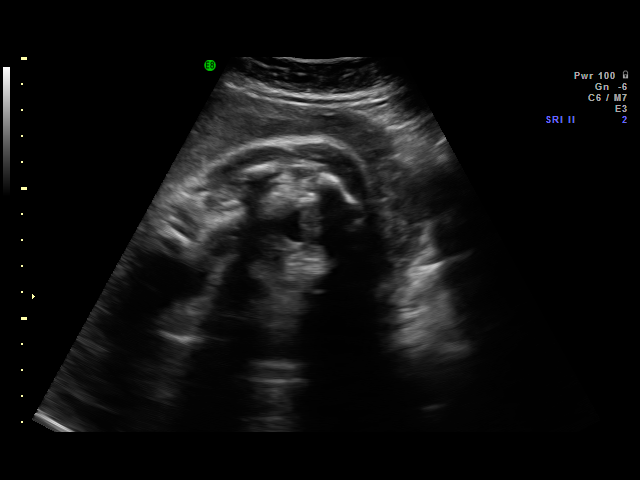

[18 of 20 positions shown; findings below may reference images not displayed]

IMPRESSION: AS OB/GYN has also been faxed to the ordering physician.

## 2008-01-16 ENCOUNTER — Encounter: Admission: RE | Admit: 2008-01-16 | Discharge: 2008-02-15 | Payer: Self-pay | Admitting: Family Medicine

## 2008-02-16 ENCOUNTER — Encounter: Admission: RE | Admit: 2008-02-16 | Discharge: 2008-03-16 | Payer: Self-pay | Admitting: Family Medicine

## 2008-03-17 ENCOUNTER — Encounter: Admission: RE | Admit: 2008-03-17 | Discharge: 2008-03-26 | Payer: Self-pay | Admitting: Family Medicine

## 2008-08-23 ENCOUNTER — Encounter: Admission: RE | Admit: 2008-08-23 | Discharge: 2008-08-23 | Payer: Self-pay | Admitting: Nephrology

## 2008-08-23 IMAGING — US US RENAL
1 series · 14 of 25 positions shown · non-contrast
Comparison: None

CLINICAL DATA: History of glomerulornephritis.  Elevated
creatinine.

RENAL/URINARY TRACT ULTRASOUND
TECHNIQUE: Complete ultrasound examination of the urinary tract
was performed including evaluation of the kidneys, renal collecting
systems, and urinary bladder.

[Series 1: us renal · 0.20mm/px · 14 of 29 slices shown]
[im 1/29]
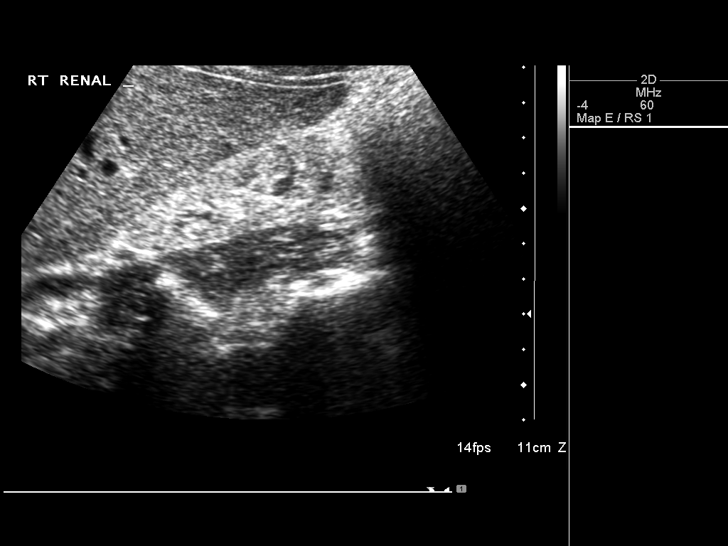
[im 3/29]
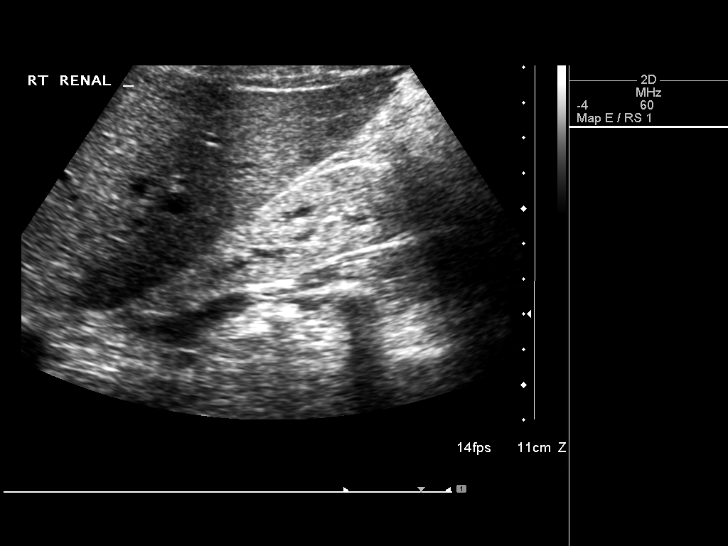
[im 5/29]
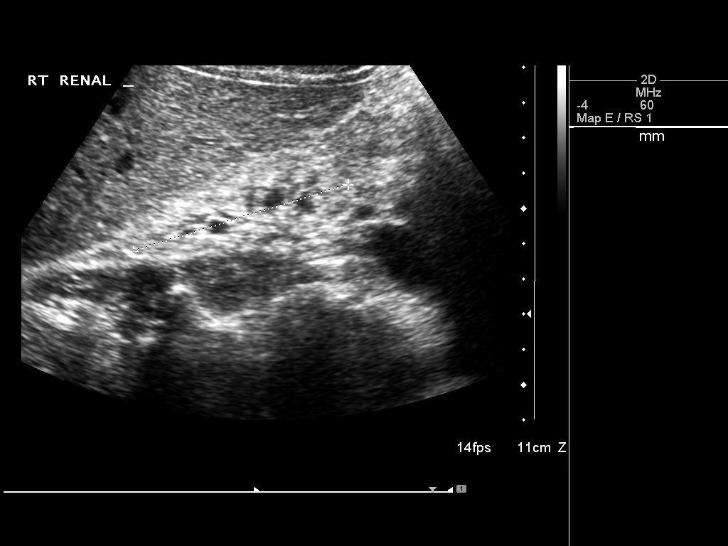
[im 8/29]
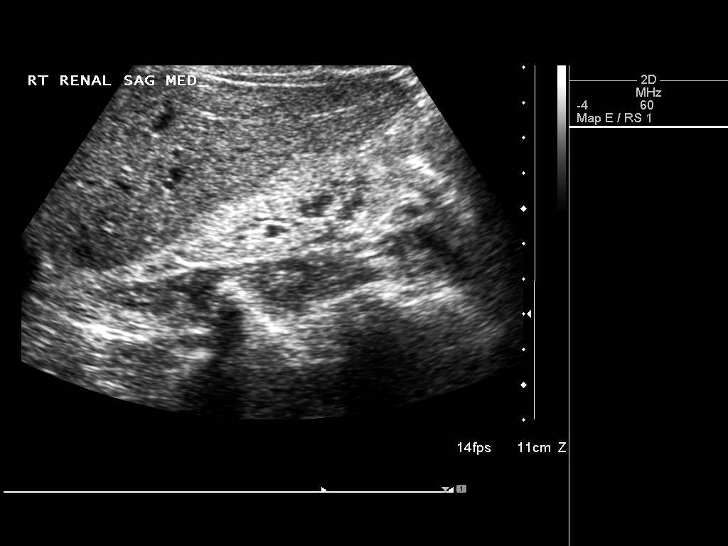
[im 10/29]
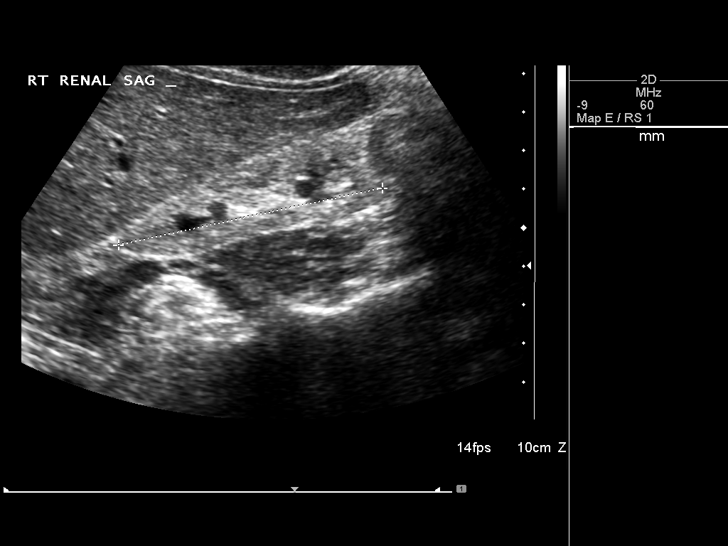
[im 11/29]
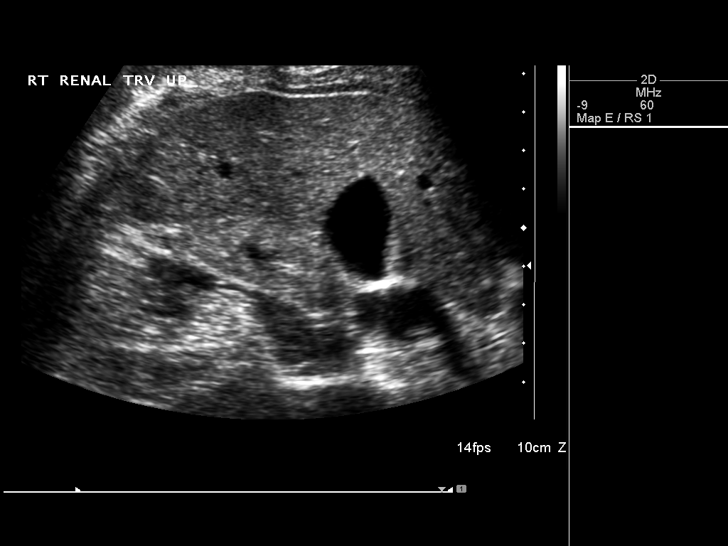
[im 13/29]
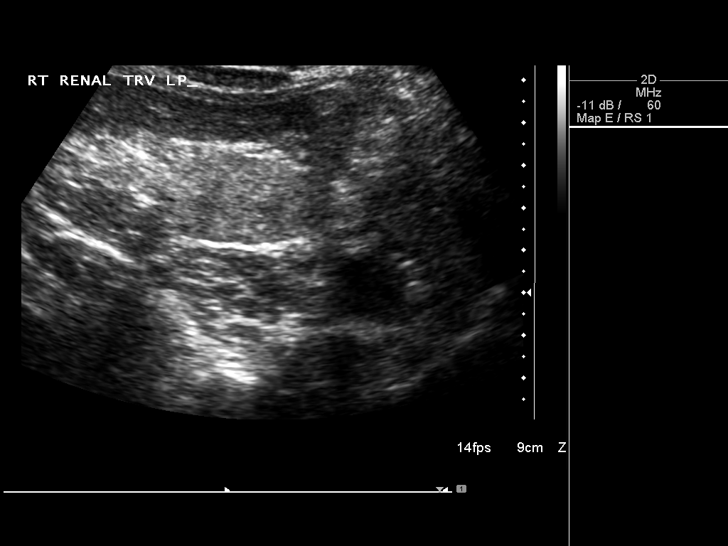
[im 16/29]
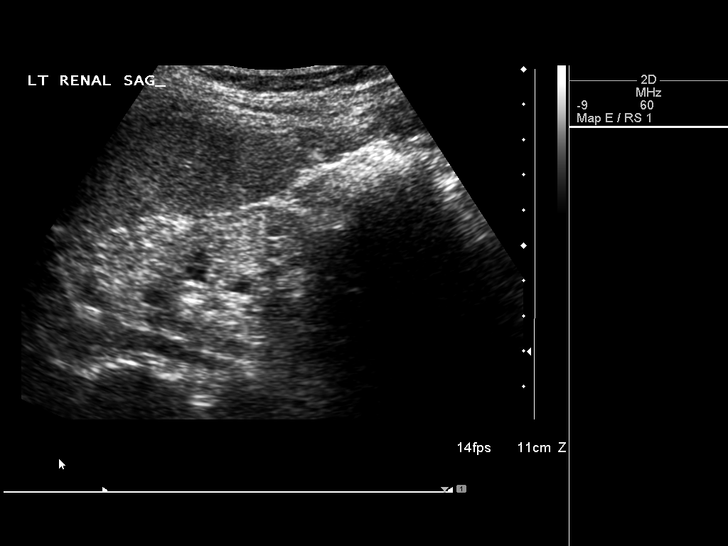
[im 18/29]
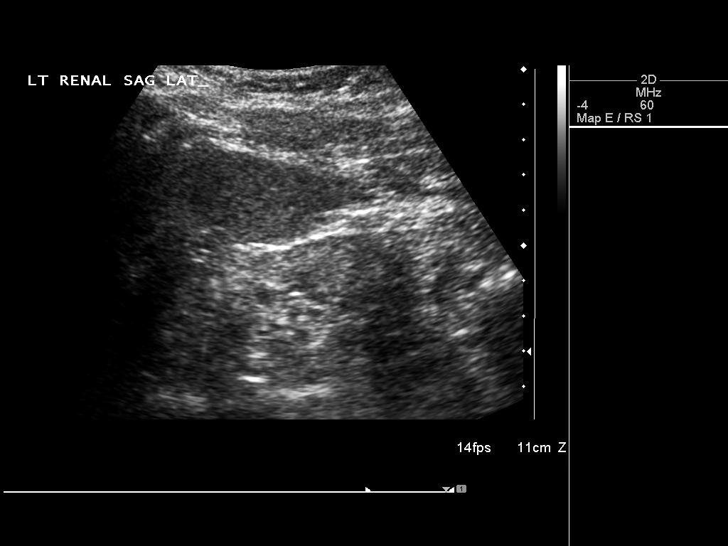
[im 19/29]
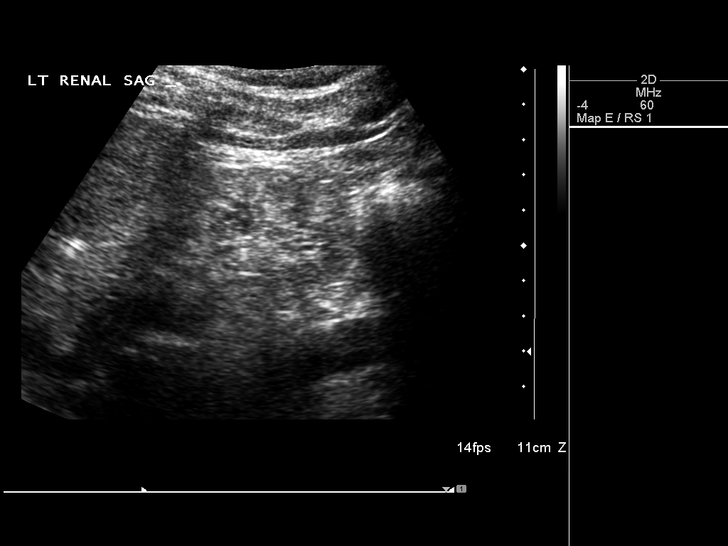
[im 22/29]
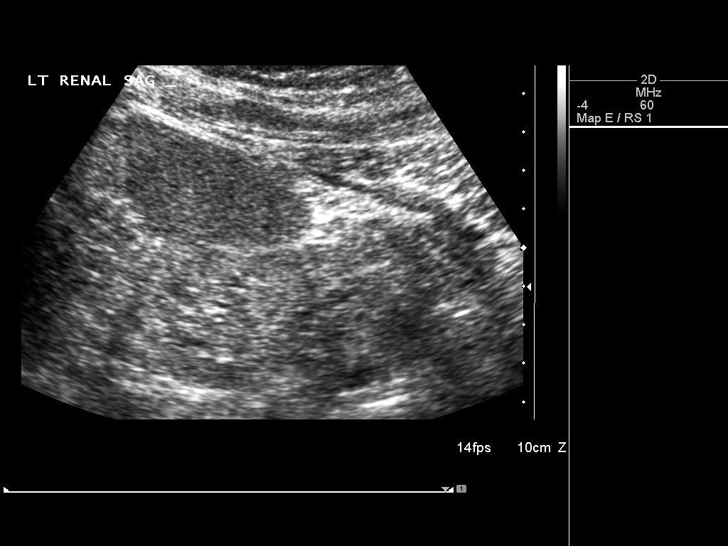
[im 24/29]
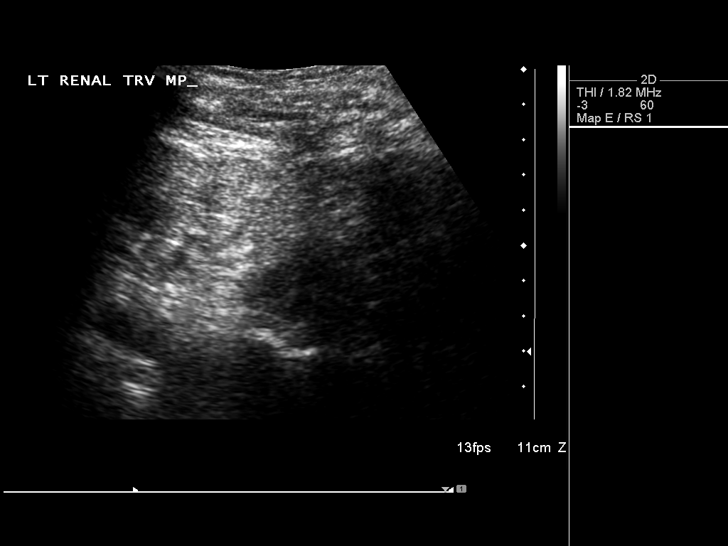
[im 26/29]
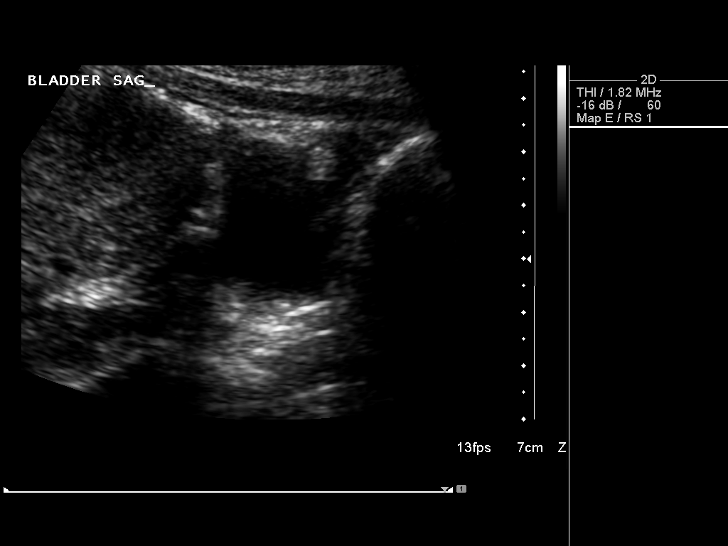
[im 29/29]
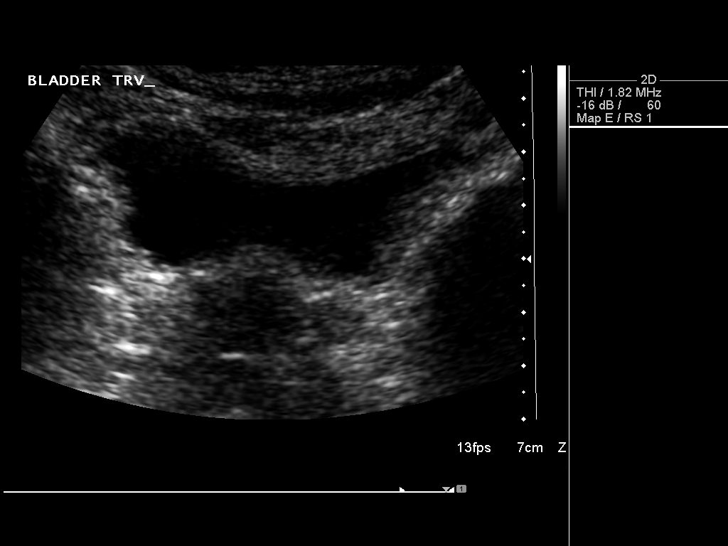

[14 of 25 positions shown; findings below may reference images not displayed]

FINDINGS: Right kidney 7.0 and left kidney 6.4cm.  No
hydronephrosis.  Both kidneys are moderately hyperechoic with
cortical thinning.

Urinary bladder within normal limits.
IMPRESSION: 1.  No evidence of hydronephrosis.
2.  Hyperechoic, atrophic kidneys bilaterally most consistent with
chronic medical renal disease.

## 2008-09-11 ENCOUNTER — Ambulatory Visit: Payer: Self-pay | Admitting: Vascular Surgery

## 2008-09-25 ENCOUNTER — Ambulatory Visit (HOSPITAL_COMMUNITY): Admission: RE | Admit: 2008-09-25 | Discharge: 2008-09-25 | Payer: Self-pay | Admitting: Vascular Surgery

## 2008-09-25 ENCOUNTER — Ambulatory Visit: Payer: Self-pay | Admitting: Vascular Surgery

## 2008-09-25 IMAGING — CR DG CHEST 2V
2 series · 2 of 2 positions shown · non-contrast
Comparison: None.

CLINICAL DATA: 32-year-old female preoperative study.  End-stage
renal disease.

CHEST - 2 VIEW

[view not recorded (1 of 2)]
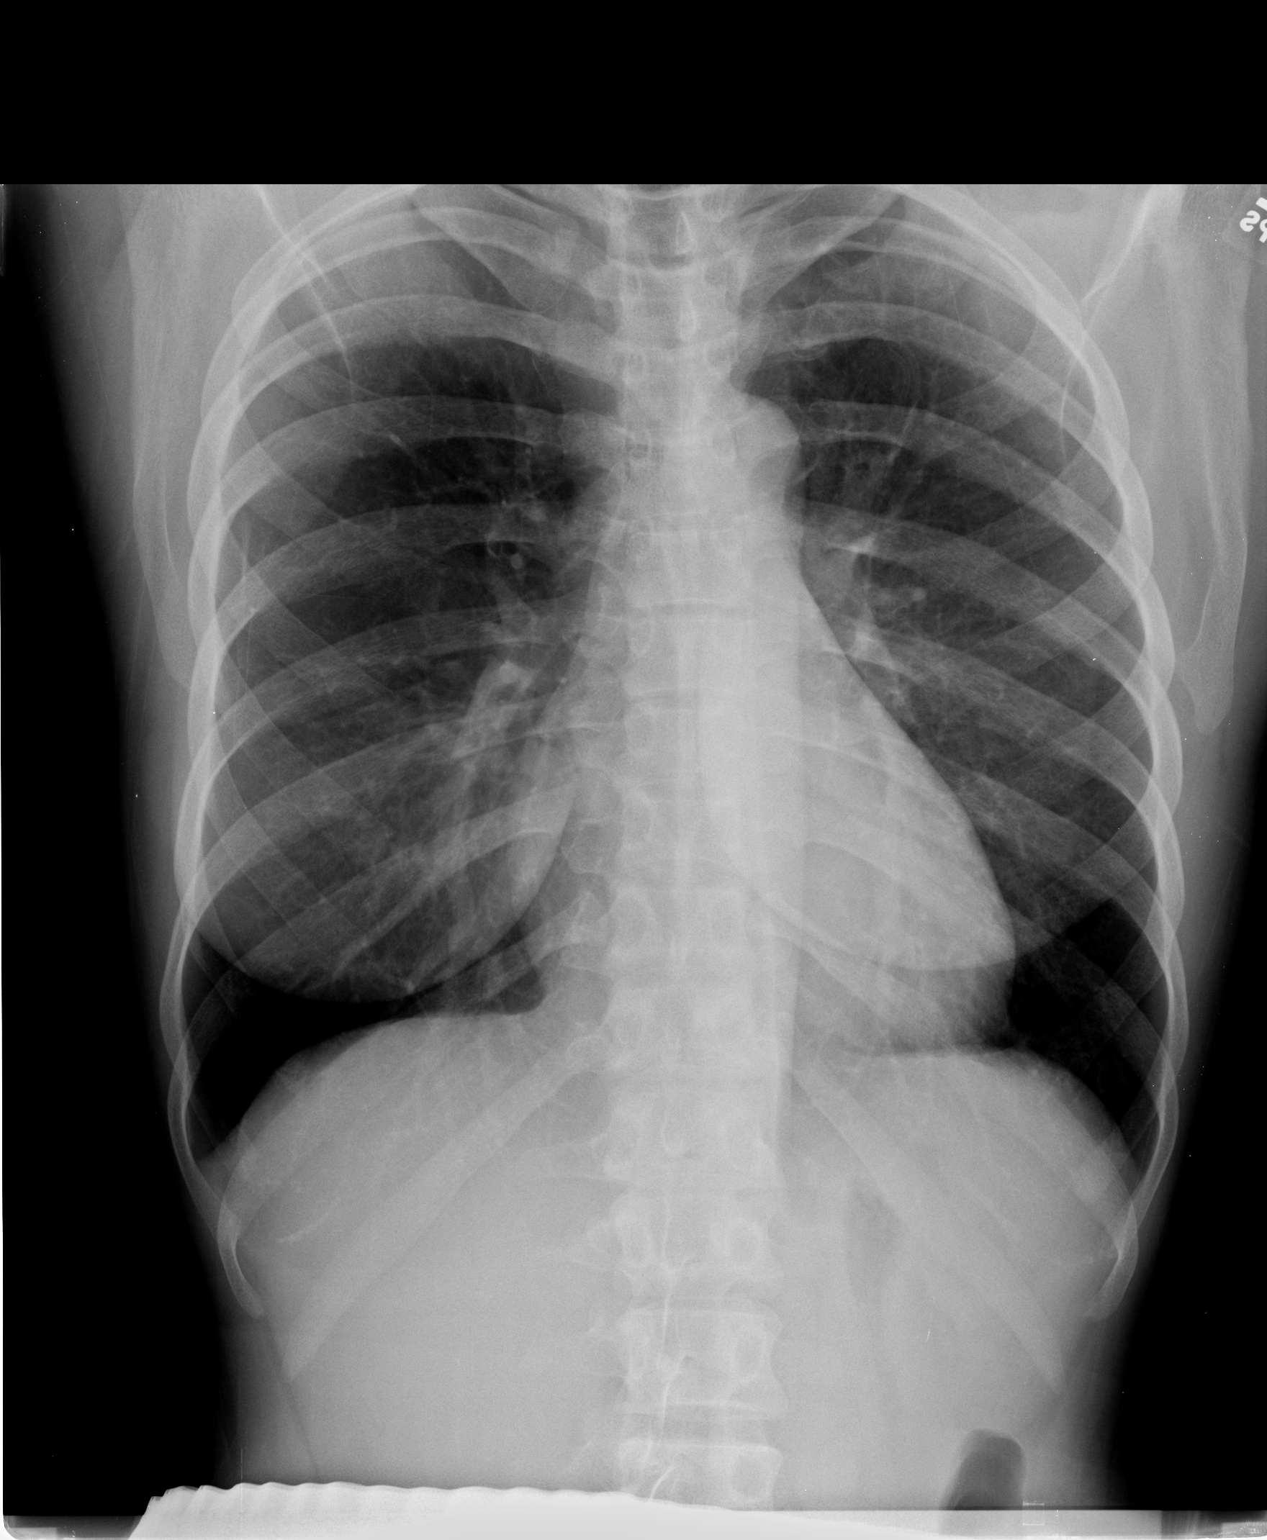

[view not recorded (2 of 2)]
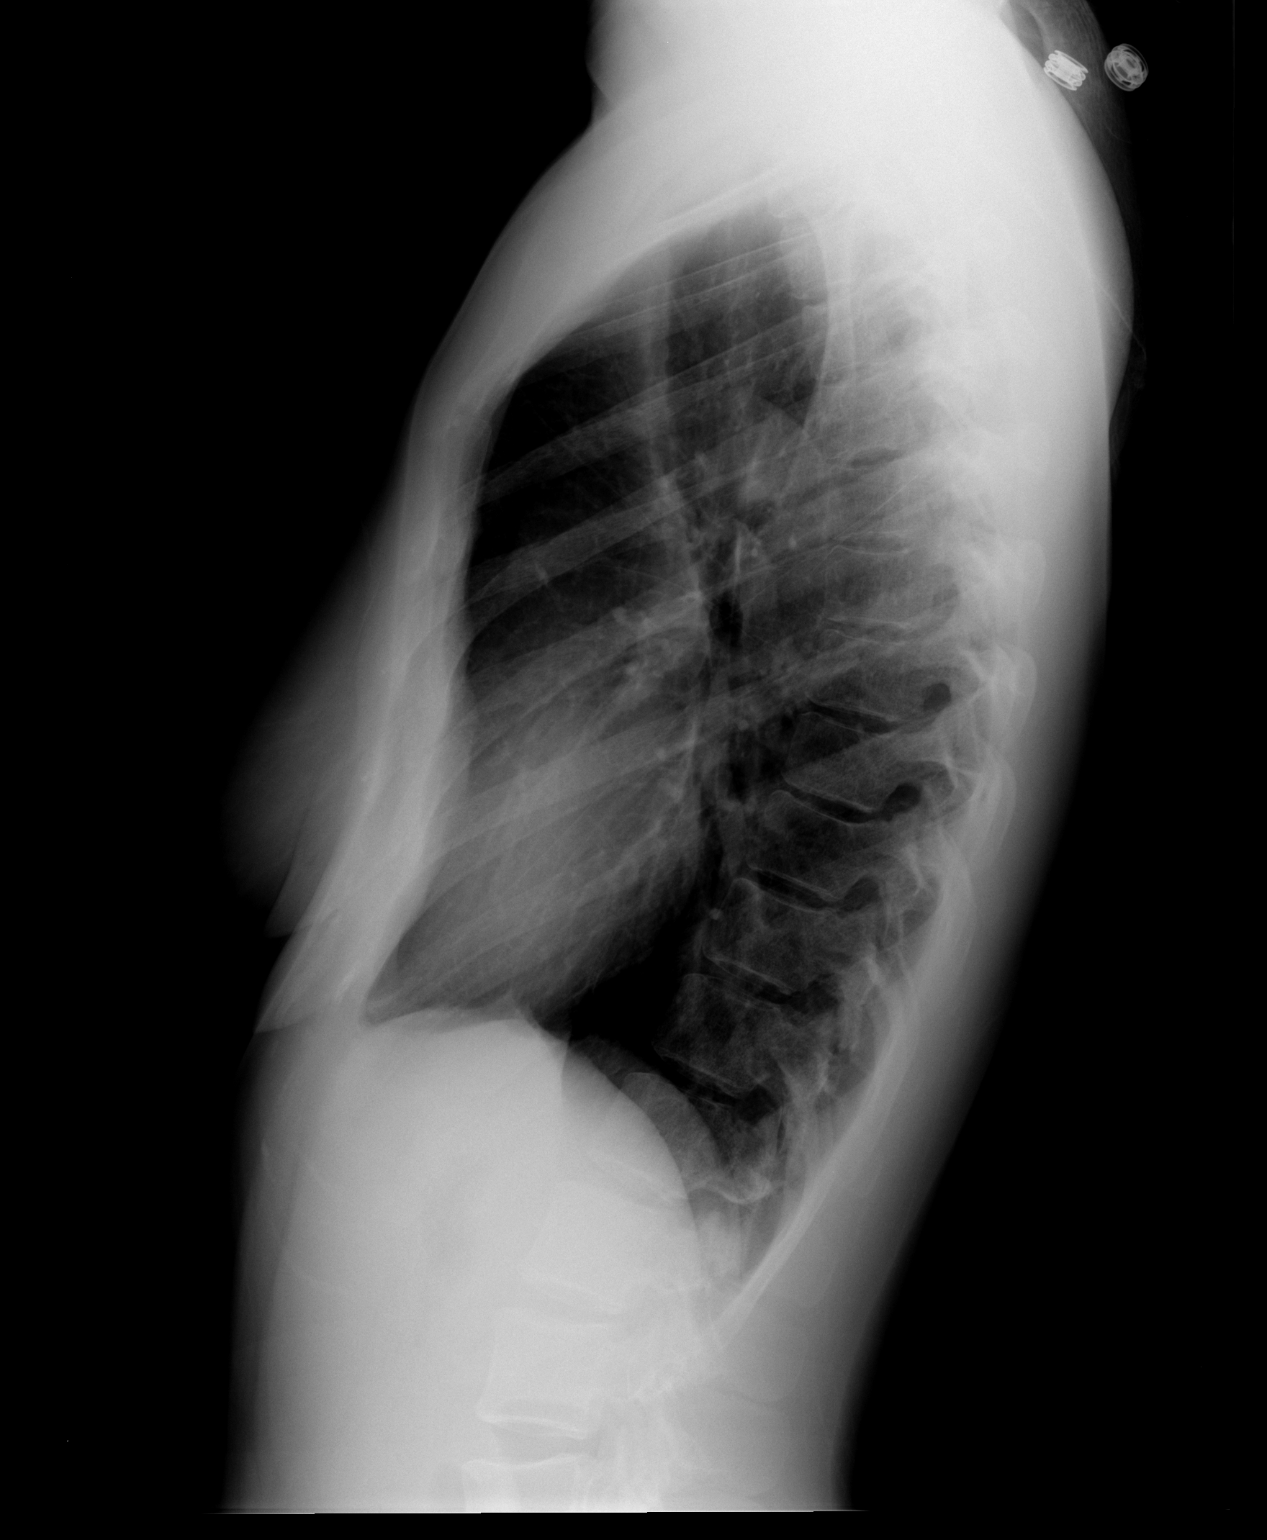

[2 of 2 positions shown; findings below may reference images not displayed]

FINDINGS: Lung volumes are at the upper limits of normal.  Cardiac
size and mediastinal contours are within normal limits.  No
pneumothorax, or pleural effusion and the lungs are clear. Tracheal
air column is within normal limits.  No acute osseous abnormality
identified.  Pectus excavatum deformity.
IMPRESSION: No acute cardiopulmonary abnormality.

## 2008-10-31 ENCOUNTER — Ambulatory Visit: Payer: Self-pay | Admitting: Vascular Surgery

## 2008-12-07 ENCOUNTER — Encounter (HOSPITAL_COMMUNITY): Admission: RE | Admit: 2008-12-07 | Discharge: 2009-03-07 | Payer: Self-pay | Admitting: Nephrology

## 2009-01-14 ENCOUNTER — Telehealth (INDEPENDENT_AMBULATORY_CARE_PROVIDER_SITE_OTHER): Payer: Self-pay | Admitting: Family Medicine

## 2009-01-15 ENCOUNTER — Ambulatory Visit: Payer: Self-pay | Admitting: Family Medicine

## 2009-03-20 ENCOUNTER — Encounter (HOSPITAL_COMMUNITY): Admission: RE | Admit: 2009-03-20 | Discharge: 2009-06-18 | Payer: Self-pay | Admitting: Nephrology

## 2009-03-25 ENCOUNTER — Ambulatory Visit: Payer: Self-pay | Admitting: Family Medicine

## 2009-03-25 ENCOUNTER — Encounter: Payer: Self-pay | Admitting: Family Medicine

## 2009-03-25 ENCOUNTER — Other Ambulatory Visit: Admission: RE | Admit: 2009-03-25 | Discharge: 2009-03-25 | Payer: Self-pay | Admitting: *Deleted

## 2009-03-25 LAB — CONVERTED CEMR LAB
Chlamydia, DNA Probe: NEGATIVE
GC Probe Amp, Genital: NEGATIVE
Whiff Test: NEGATIVE

## 2009-03-29 ENCOUNTER — Encounter: Payer: Self-pay | Admitting: Family Medicine

## 2009-07-24 ENCOUNTER — Encounter (HOSPITAL_COMMUNITY): Admission: RE | Admit: 2009-07-24 | Discharge: 2009-10-22 | Payer: Self-pay | Admitting: Nephrology

## 2009-09-04 ENCOUNTER — Encounter: Payer: Self-pay | Admitting: Family Medicine

## 2009-09-04 LAB — CONVERTED CEMR LAB
ALT: 32 units/L
AST: 24 units/L
Albumin: 4 g/dL
Alkaline Phosphatase: 67 units/L
BUN: 81 mg/dL
CO2: 18 meq/L
Calcium: 7.9 mg/dL
Chloride: 109 meq/L
Creatinine, Ser: 9 mg/dL
Glucose, Bld: 78 mg/dL
Potassium: 4.1 meq/L
Sodium: 140 meq/L
Total Bilirubin: 0.6 mg/dL
Total Protein: 7.1 g/dL

## 2009-10-09 ENCOUNTER — Telehealth: Payer: Self-pay | Admitting: *Deleted

## 2009-10-10 ENCOUNTER — Encounter: Payer: Self-pay | Admitting: Family Medicine

## 2009-10-10 ENCOUNTER — Ambulatory Visit: Payer: Self-pay | Admitting: Family Medicine

## 2009-10-10 DIAGNOSIS — K219 Gastro-esophageal reflux disease without esophagitis: Secondary | ICD-10-CM | POA: Insufficient documentation

## 2009-10-10 LAB — CONVERTED CEMR LAB
Chlamydia, DNA Probe: NEGATIVE
GC Probe Amp, Genital: NEGATIVE
Whiff Test: POSITIVE

## 2009-11-27 ENCOUNTER — Ambulatory Visit: Payer: Self-pay | Admitting: Advanced Practice Midwife

## 2009-11-27 ENCOUNTER — Inpatient Hospital Stay (HOSPITAL_COMMUNITY): Admission: AD | Admit: 2009-11-27 | Discharge: 2009-11-27 | Payer: Self-pay | Admitting: Obstetrics & Gynecology

## 2010-02-03 ENCOUNTER — Encounter: Payer: Self-pay | Admitting: Family Medicine

## 2010-02-03 ENCOUNTER — Ambulatory Visit: Payer: Self-pay | Admitting: Family Medicine

## 2010-02-03 LAB — CONVERTED CEMR LAB
Beta hcg, urine, semiquantitative: NEGATIVE
Bilirubin Urine: NEGATIVE
Chlamydia, DNA Probe: NEGATIVE
GC Probe Amp, Genital: POSITIVE — AB
Glucose, Urine, Semiquant: NEGATIVE
Ketones, urine, test strip: NEGATIVE
Nitrite: NEGATIVE
Protein, U semiquant: >=300
Specific Gravity, Urine: 1.02
Urobilinogen, UA: 0.2
Whiff Test: POSITIVE
pH: 5.5

## 2010-02-04 ENCOUNTER — Encounter: Payer: Self-pay | Admitting: Family Medicine

## 2010-02-06 LAB — CONVERTED CEMR LAB
ALT: 15 units/L (ref 0–35)
AST: 14 units/L (ref 0–37)
Albumin: 4.1 g/dL (ref 3.5–5.2)
Alkaline Phosphatase: 76 units/L (ref 39–117)
BUN: 80 mg/dL — ABNORMAL HIGH (ref 6–23)
Basophils Absolute: 0 10*3/uL (ref 0.0–0.1)
Basophils Relative: 0 % (ref 0–1)
CO2: 20 meq/L (ref 19–32)
Calcium: 7.2 mg/dL — ABNORMAL LOW (ref 8.4–10.5)
Chloride: 110 meq/L (ref 96–112)
Creatinine, Ser: 12.05 mg/dL — ABNORMAL HIGH (ref 0.40–1.20)
Eosinophils Absolute: 0.1 10*3/uL (ref 0.0–0.7)
Eosinophils Relative: 2 % (ref 0–5)
Glucose, Bld: 75 mg/dL (ref 70–99)
HCT: 24.7 % — ABNORMAL LOW (ref 36.0–46.0)
Hemoglobin: 8.3 g/dL — ABNORMAL LOW (ref 12.0–15.0)
Lipase: 43 units/L (ref 11–59)
Lymphocytes Relative: 13 % (ref 12–46)
Lymphs Abs: 0.8 10*3/uL (ref 0.7–4.0)
MCHC: 33.7 g/dL (ref 30.0–36.0)
MCV: 89.5 fL (ref 78.0–100.0)
Monocytes Absolute: 0.5 10*3/uL (ref 0.1–1.0)
Monocytes Relative: 8 % (ref 3–12)
Neutro Abs: 4.8 10*3/uL (ref 1.7–7.7)
Neutrophils Relative %: 77 % (ref 43–77)
Platelets: 145 10*3/uL — ABNORMAL LOW (ref 150–400)
Potassium: 4.1 meq/L (ref 3.5–5.3)
RBC: 2.76 M/uL — ABNORMAL LOW (ref 3.87–5.11)
RDW: 13.6 % (ref 11.5–15.5)
Sodium: 141 meq/L (ref 135–145)
Total Bilirubin: 0.3 mg/dL (ref 0.3–1.2)
Total Protein: 7.4 g/dL (ref 6.0–8.3)
WBC: 6.2 10*3/uL (ref 4.0–10.5)

## 2010-03-13 ENCOUNTER — Telehealth: Payer: Self-pay | Admitting: Family Medicine

## 2010-03-20 ENCOUNTER — Encounter: Payer: Self-pay | Admitting: Family Medicine

## 2010-03-20 ENCOUNTER — Ambulatory Visit: Payer: Self-pay | Admitting: Family Medicine

## 2010-03-20 LAB — CONVERTED CEMR LAB
Beta hcg, urine, semiquantitative: NEGATIVE
Chlamydia, DNA Probe: NEGATIVE
GC Probe Amp, Genital: NEGATIVE

## 2010-05-08 ENCOUNTER — Ambulatory Visit: Payer: Self-pay | Admitting: Family Medicine

## 2010-05-08 LAB — CONVERTED CEMR LAB: Whiff Test: NEGATIVE

## 2010-06-02 ENCOUNTER — Ambulatory Visit: Payer: Self-pay | Admitting: Family Medicine

## 2010-06-03 ENCOUNTER — Encounter: Payer: Self-pay | Admitting: Sports Medicine

## 2010-06-03 ENCOUNTER — Ambulatory Visit: Payer: Self-pay | Admitting: Family Medicine

## 2010-06-03 DIAGNOSIS — N739 Female pelvic inflammatory disease, unspecified: Secondary | ICD-10-CM | POA: Insufficient documentation

## 2010-06-03 LAB — CONVERTED CEMR LAB
Chlamydia, DNA Probe: NEGATIVE
GC Probe Amp, Genital: NEGATIVE

## 2010-07-08 ENCOUNTER — Ambulatory Visit: Payer: Self-pay | Admitting: Family Medicine

## 2010-07-08 ENCOUNTER — Encounter: Payer: Self-pay | Admitting: Sports Medicine

## 2010-07-08 DIAGNOSIS — IMO0002 Reserved for concepts with insufficient information to code with codable children: Secondary | ICD-10-CM | POA: Insufficient documentation

## 2010-07-08 LAB — CONVERTED CEMR LAB
Chlamydia, DNA Probe: NEGATIVE
DHEA-SO4: 172 ug/dL (ref 35–430)
Estradiol: 109.5 pg/mL
FSH: 4 milliintl units/mL
GC Probe Amp, Genital: NEGATIVE
LH: 6.8 milliintl units/mL
Sex Hormone Binding: 91 nmol/L (ref 18–114)
Testosterone: <10 ng/dL — ABNORMAL LOW (ref 10–70)
Whiff Test: NEGATIVE

## 2010-07-18 ENCOUNTER — Ambulatory Visit (HOSPITAL_COMMUNITY)
Admission: RE | Admit: 2010-07-18 | Discharge: 2010-07-18 | Payer: Self-pay | Source: Home / Self Care | Admitting: Sports Medicine

## 2010-07-18 IMAGING — US US TRANSVAGINAL NON-OB
1 series · 14 of 25 positions shown · non-contrast
Comparison: None.

CLINICAL DATA: Sharp pelvic pain.  History of PID.  LMP [DATE]



[Series 1: us pelvis complete modify · 70 acquisitions, 14 frames shown]
[im 1/70]
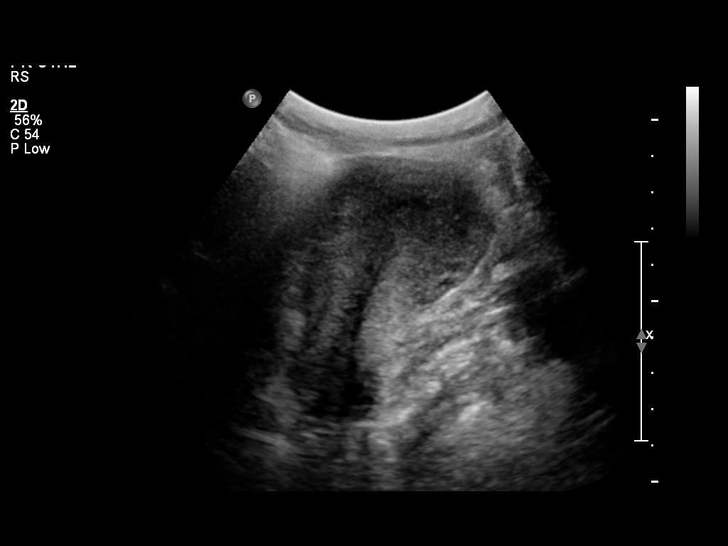
[im 6/70]
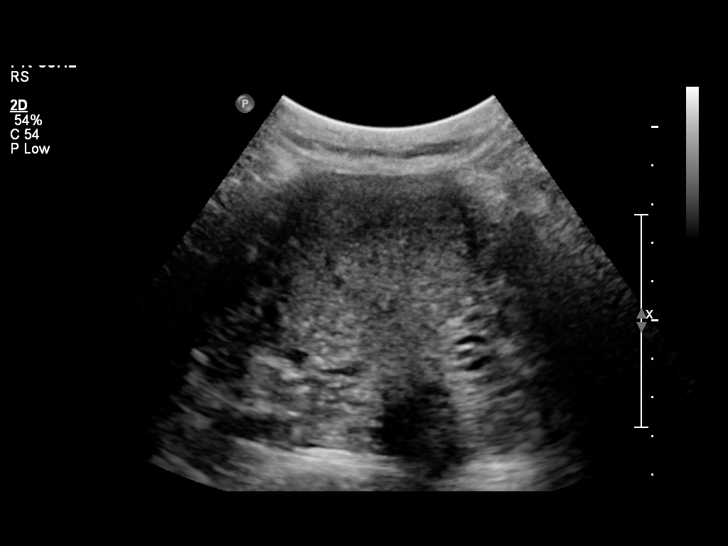
[im 12/70]
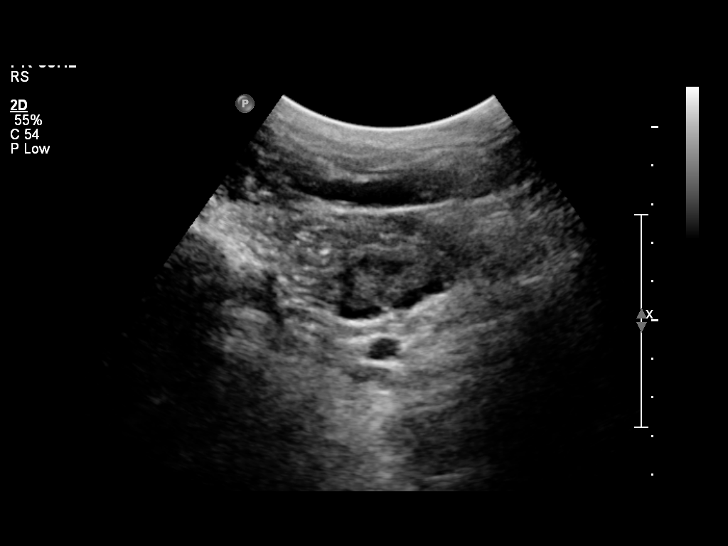
[im 18/70]
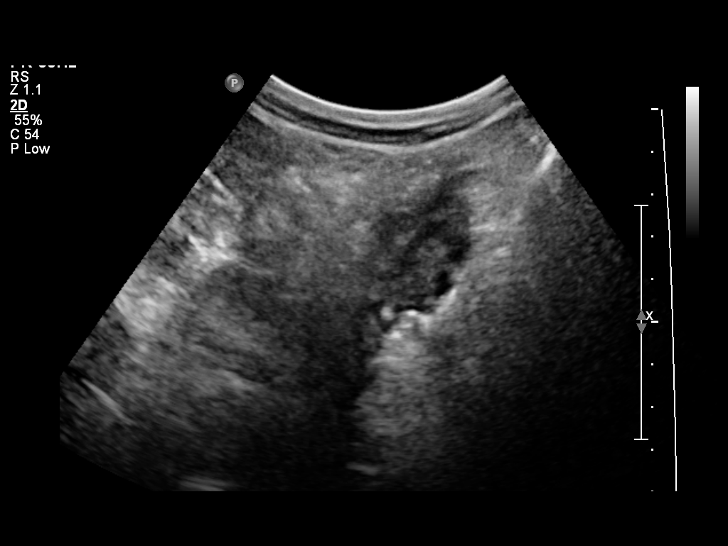
[im 24/70]
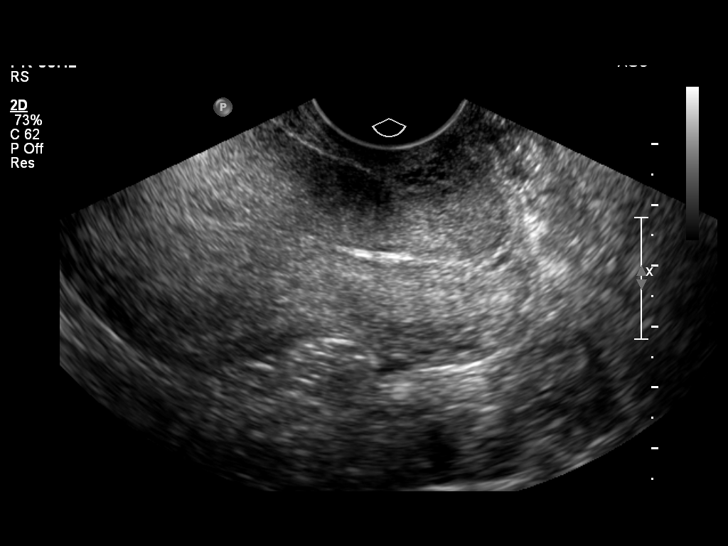
[im 26/70]
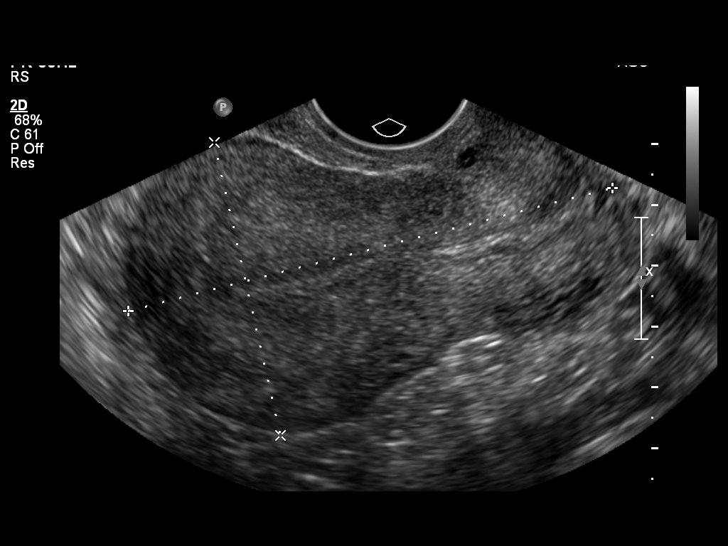
[im 32/70]
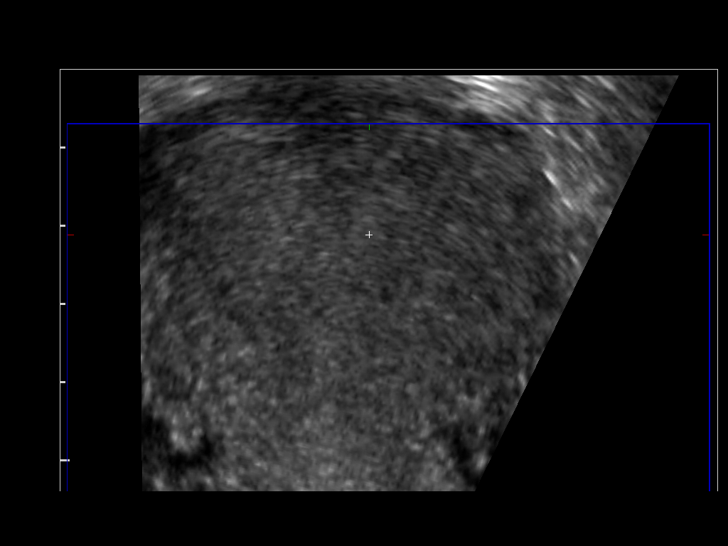
[im 38/70]
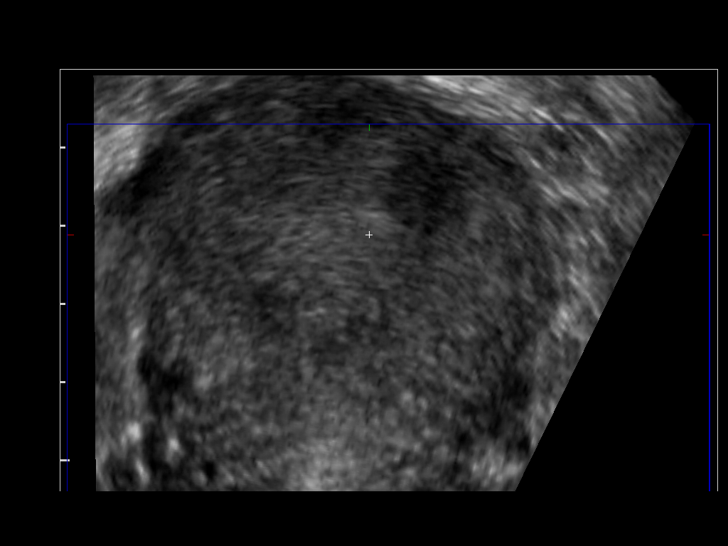
[im 44/70]
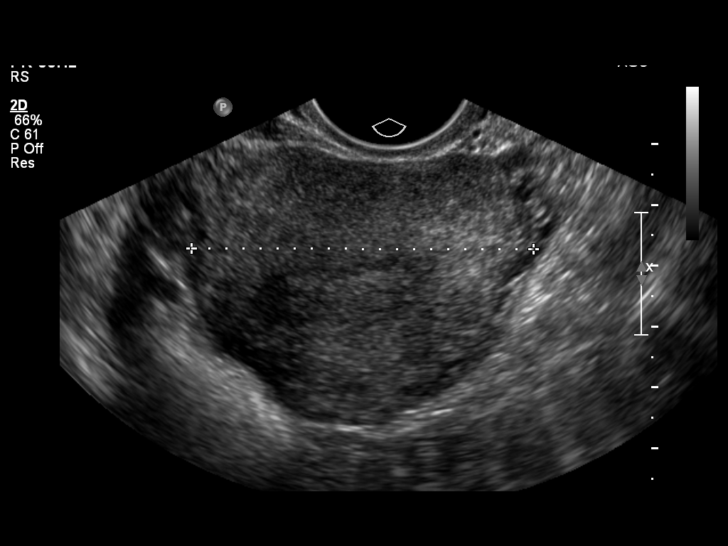
[im 47/70]
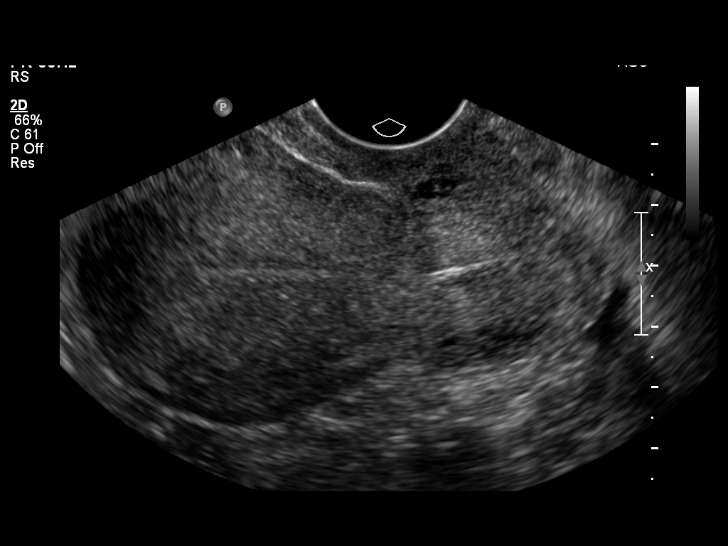
[im 52/70]
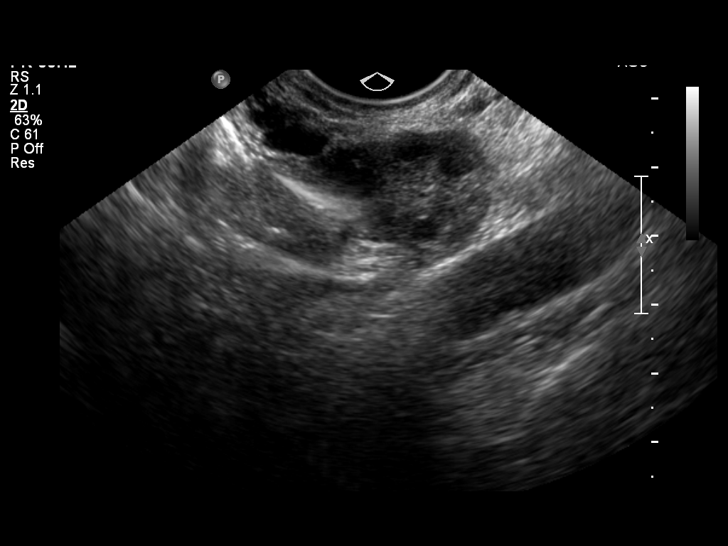
[im 58/70]
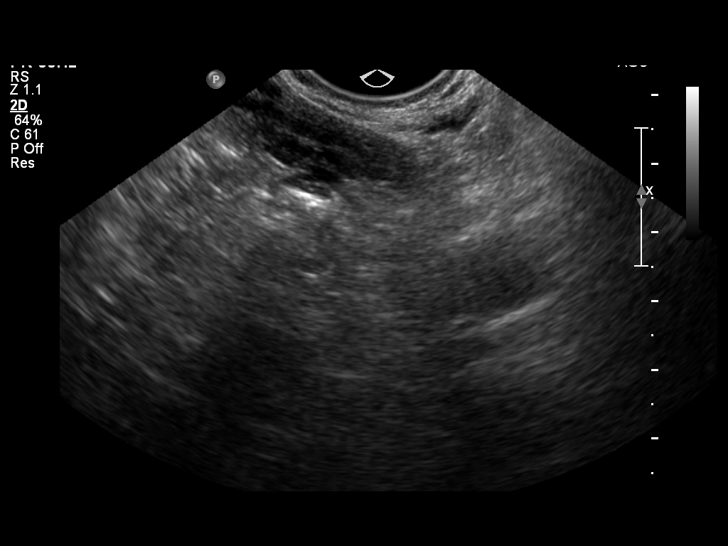
[im 64/70]
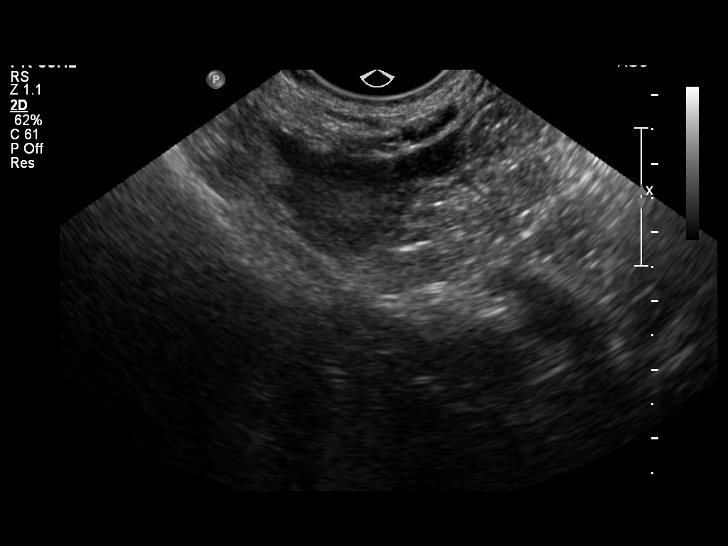
[im 70/70]
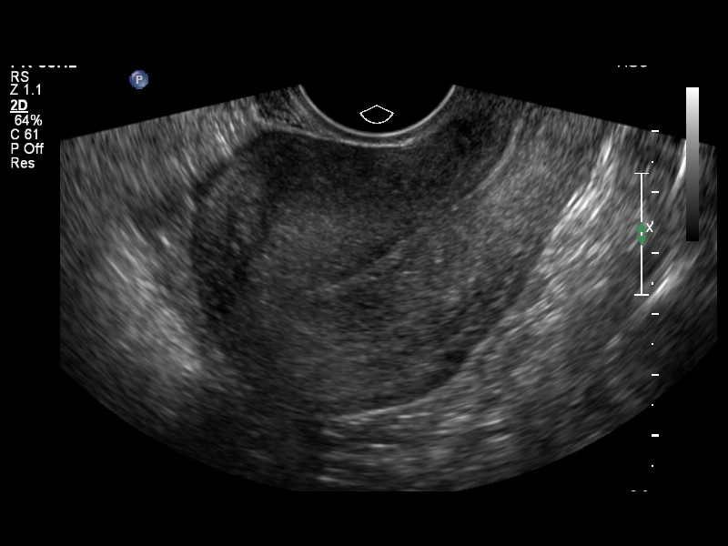

[14 of 25 positions shown; findings below may reference images not displayed]

FINDINGS: Uterus the uterus demonstrates a sagittal length 7.5 cm, an AP
depth of 5.0 cm and a transverse width of 5.7 cm.  A homogeneous
uterine myometrium is seen.

Endometrium is thin and echogenic with an AP width of 3.1 mm.  No
areas of focal thickening or heterogeneity are noted and this would
correlate with a postsecretory endometrial stripe and the patient's
given LMP of [DATE]

Right Ovary has a normal appearance measuring 3.4 x 1.9 x 2.4 cm

Left Ovary has a normal appearance measuring 2.5 x 1.7 x 2.0 cm

Other Findings:  No pelvic fluid or separate adnexal masses are
seen.
IMPRESSION: Normal postsecretory pelvic ultrasound

## 2010-07-29 ENCOUNTER — Encounter: Payer: Self-pay | Admitting: Family Medicine

## 2010-08-07 ENCOUNTER — Telehealth (INDEPENDENT_AMBULATORY_CARE_PROVIDER_SITE_OTHER): Payer: Self-pay | Admitting: *Deleted

## 2010-09-14 ENCOUNTER — Encounter: Payer: Self-pay | Admitting: *Deleted

## 2010-09-23 NOTE — Progress Notes (Signed)
Summary: triage   Phone Note Call from Patient Call back at [phone]   Caller: Patient Summary of Call: Pt thinks she has a yeast infection and wondering if she can be seen? Initial call taken by: Raymond Gurney,  October 09, 2009 11:26 AM  Follow-up for Phone Call        left message Follow-up by: Elige Radon RN,  October 09, 2009 11:32 AM  Additional Follow-up for Phone Call Additional follow up Details #1::        left message for her to call & make appt for tomorrow Additional Follow-up by: Elige Radon RN,  October 09, 2009 1:45 PM

## 2010-09-23 NOTE — Assessment & Plan Note (Signed)
Summary: vaginal discharge   Vital Signs:  Patient profile:   35 year old female Height:      67 inches Weight:      115 pounds BMI:     18.08 Temp:     98.3 degrees F oral Pulse rate:   93 / minute BP sitting:   148 / 98  (left arm) Cuff size:   regular  Vitals Entered By: Enid Skeens, CMA (March 20, 2010 2:18 PM) CC: vaginal discharge & itching x1 week Is Patient Diabetic? Yes   Primary Care Provider:  Dorthey Sawyer MD  CC:  vaginal discharge & itching x1 week.  History of Present Illness: white vaginal discharge: x 1 week, foul odor since yesterday,   no sexual activity since last treated about 1 month ago for trich and GC.  No vaginal bleeding.  No abd pain.  No fever. menstrual period 1 week late.   Current Medications (verified): 1)  Ranitidine Hcl 150 Mg Tabs (Ranitidine Hcl) .... One Daily For 2 Weeks Then One Daily As Needed For Stomach Pain and Pain Wtih Swallow 2)  Flagyl 500 Mg Tabs (Metronidazole) .... One By Mouth Two Times A Day X 7 Days 3)  Hydrocodone-Acetaminophen 5-325 Mg Tabs (Hydrocodone-Acetaminophen) .Marland Kitchen.. 1 By Mouth Up To 4 Times Per Day As Needed For Pain 4)  Zofran 4 Mg Tabs (Ondansetron Hcl) .Marland Kitchen.. 1 By Mouth Every 8 Hours As Needed For Nausea  Allergies (verified): No Known Drug Allergies  Review of Systems       as per hpi  Physical Exam  General:  VSS Well-developed,well-nourished,in no acute distress; alert,appropriate and cooperative throughout examination Lungs:  Normal respiratory effort, chest expands symmetrically. Abdomen:  Bowel sounds positive,abdomen soft and non-tender without masses, organomegaly or hernias noted. Genitalia:  Pelvic Exam:        External: normal female genitalia without lesions or masses        Vagina: normal without lesions or masses        Cervix: normal without lesions or masses        Adnexa: normal bimanual exam without masses or fullness        Uterus: normal by palpation        Pap smear: not  performed wet prep and GC/Chlam obtained and sent to lab Extremities:  no edema   Impression & Recommendations:  Problem # 1:  VAGINAL DISCHARGE (ICD-623.5) Sent off wet prep.  also sent GC/Chlam to ensure adequate treatment.    Also ordered HIV and RPR since pt has not been tested since she had unprotected sex.  No sexual activity since last exam her at [facility] per pt.   Orders: [place]- Est Level  3 [id number])  Complete Medication List: 1)  Ranitidine Hcl 150 Mg Tabs (Ranitidine hcl) .... One daily for 2 weeks then one daily as needed for stomach pain and pain wtih swallow 2)  Flagyl 500 Mg Tabs (Metronidazole) .... One by mouth two times a day x 7 days 3)  Hydrocodone-acetaminophen 5-325 Mg Tabs (Hydrocodone-acetaminophen) .Marland Kitchen.. 1 by mouth up to 4 times per day as needed for pain 4)  Zofran 4 Mg Tabs (Ondansetron hcl) .Marland Kitchen.. 1 by mouth every 8 hours as needed for nausea  Other Orders: Wet Prep- [place] [phone]) U Preg-[facility] ([id number]) RPR-[facility] [phone]) HIV-[facility] [id number]) GC/Chlamydia-[facility] ([id number])  Patient Instructions: 1)  If you are positive for any infections we will call you. 2)  will test your blood for syphilis and HIV. 3)  Return  as needed for follow up. 4)  If you could be exposed to sexually transmitted diseases. you should use a condom.   Laboratory Results   Urine Tests  Date/Time Received: March 20, 2010 2:33 PM   Date/Time Reported: March 20, 2010 3:04 PM     Urine HCG: negative Comments: ...........test performed by...........Marland KitchenHedy Camara, CMA  Date/Time Received: March 20, 2010 2:33 PM  Date/Time Reported: March 20, 2010 3:04 PM   [employer] Source: vaginal WBC/hpf: 10-20 Bacteria/hpf: 3+ Clue cells/hpf: none Yeast/hpf: none Trichomonas/hpf: none Comments: ...........test performed by...........Marland KitchenHedy Camara, CMA

## 2010-09-23 NOTE — Progress Notes (Signed)
Summary: Triage   Phone Note Call from Patient Call back at [phone]   Summary of Call: pt thinks she has a yeast inf can she be seen. Initial call taken by: Raymond Gurney,  Jan 14, 2009 2:09 PM  Follow-up for Phone Call        left message that we had a 3pm available. to call back if she wants that appt Follow-up by: Elige Radon RN,  Jan 14, 2009 2:21 PM  Additional Follow-up for Phone Call Additional follow up Details #1::        she will be here by 3pm Additional Follow-up by: Elige Radon RN,  Jan 14, 2009 2:27 PM

## 2010-09-23 NOTE — Miscellaneous (Signed)
Summary: high risk clinic/ts   Clinical Lists Changes called high risk clinic and faxed info to [phone]. waiting on appt. ...................................................................THEKLA SLADE CMA,  August 12, 2007 10:18 AM

## 2010-09-23 NOTE — Consult Note (Signed)
Summary: [facility] Discharge letter  [phone] Discharge letter   Imported By: Audie Clear 09/09/2009 10:50:51  _____________________________________________________________________  External Attachment:    Type:   Image     Comment:   External Document

## 2010-09-23 NOTE — Consult Note (Signed)
Summary: [facility]  [facility]   Imported By: Drucie Ip 10/05/2007 14:51:45  _____________________________________________________________________  External Attachment:    Type:   Image     Comment:   External Document

## 2010-09-23 NOTE — Progress Notes (Signed)
Summary: Labs/referral   Phone Note From Other Clinic Call back at [phone]   Caller: Jenne Pane, RN @ [facility] Summary of Call: Needs any labs from 01/2006-recent faxed.  Fax number is [phone]. Initial call taken by: Drucie Ip,  September 27, 2007 10:58 AM  Follow-up for Phone Call        Labs from 6/07 to present faxed to nephrology office. Follow-up by: AMY MARTIN RN,  September 27, 2007 11:37 AM

## 2010-09-23 NOTE — Miscellaneous (Signed)
Summary: Orders Update   Clinical Lists Changes  Orders: Added new Test order of Free T3-[facility] [phone]) - Signed Added new Test order of Free T4[facility] [phone]) - Signed

## 2010-09-23 NOTE — Letter (Signed)
Summary: Generic Letter  [facility]  [address]   [place], [place] [address]   Phone: [phone]  Fax: [phone]    03/29/2009  JENEL WICE [address] [place], [place]  [address]  Dear Ms. Breeding,  I am happy to inform you that your pap smear was normal.  We will repeat it in one year.  Also, your gonorrhea and chlamydia tests were negative.  If you have any questions, please call our office.      Sincerely,   Orland Mustard  MD  Appended Document: Generic Letter mailed

## 2010-09-23 NOTE — Assessment & Plan Note (Signed)
Summary: Congested, vaginal complaints/PT OF [facility]ANS/MC   Vital Signs:  Patient profile:   35 year old female Weight:      124 pounds Temp:     98.1 degrees F oral Pulse rate:   80 / minute BP sitting:   132 / 78  (right arm)  Vitals Entered By: Mauricia Area CMA, (October 10, 2009 10:16 AM) CC: congestion and cough x 1 week. vag d/c itchy/white x 4 days Is Patient Diabetic? No Pain Assessment Patient in pain? no        Primary Care Provider:  Orland Mustard  MD  CC:  congestion and cough x 1 week. vag d/c itchy/white x 4 days.  History of Present Illness: Cough for one week, picked it up from her daughter.  No fever reproted.  Pain when swallowing, when she swallows cold liquids it hurts in her chest.  Vaginal discharge and odor after sexual intercourse last week.  Her only partner, not sure about him.  Did not use a condom.  Told her he was, but discovered that he did not.  Habits & Providers  Alcohol-Tobacco-Diet     Tobacco Status: never  Current Medications (verified): 1)  Neomycin-Polymyxin-Dexameth 5-10000-0.1 Susp (Neomycin-Polymyxin-Dexameth) .Marland Kitchen.. 1 Drop in Right Ear 4 Times Daily X 2 Weeks 1 Bottle 2)  Metrogel-Vaginal 0.75 % Gel (Metronidazole) .Marland Kitchen.. 1 Applicatorful Pv Qhs X 5 Days Disp: 1 Tube 3)  Ranitidine Hcl 150 Mg Tabs (Ranitidine Hcl) .... One Daily For 2 Weeks Then One Daily As Needed For Stomach Pain and Pain Wtih Swallow  Review of Systems General:  Denies chills and fever. ENT:  Complains of hoarseness, nasal congestion, and postnasal drainage. Resp:  Complains of cough; denies wheezing. GI:  chest discomfort when swallowing.  Physical Exam  General:  Thin, alert, in no acute distress Ears:  TM retracted Nose:  some discharge on turbs Mouth:  Thick post nasal drainage Neck:  No deformities, masses, or tenderness noted. Lungs:  normal respiratory effort and normal breath sounds.   Heart:  normal rate and regular rhythm.   Genitalia:  normal  introitus, no external lesions, no vaginal discharge, mucosa pink and moist, and no vaginal or cervical lesions.  + menses   Impression & Recommendations:  Problem # 1:  CONTACT OR EXPOSURE TO OTHER VIRAL DISEASES (ICD-V01.79)  Orders: GC/Chlamydia-[facility] (87591/87491) [place]- Est  Level 4 [id number])  Problem # 2:  VAGINITIS, BACTERIAL (ICD-616.10)  Her updated medication list for this problem includes:    Metrogel-vaginal 0.75 % Gel (Metronidazole) .Marland Kitchen... 1 applicatorful pv qhs x 5 days disp: 1 tube  Orders: [place]- Est  Level 4 [id number])  Problem # 3:  URI (ICD-465.9)  Nasal saline, teaching done   Orders: [facility]- Est  Level 4 [id number])  Problem # 4:  GERD (ICD-530.81) Trial of acid reduction, if no improvement return to discuss other causes of chest pain with swallow. Her updated medication list for this problem includes:    Ranitidine Hcl 150 Mg Tabs (Ranitidine hcl) ..... One daily for 2 weeks then one daily as needed for stomach pain and pain wtih swallow  Complete Medication List: 1)  Neomycin-polymyxin-dexameth 5-10000-0.1 Susp (Neomycin-polymyxin-dexameth) .Marland Kitchen.. 1 drop in right ear 4 times daily x 2 weeks 1 bottle 2)  Metrogel-vaginal 0.75 % Gel (Metronidazole) .Marland Kitchen.. 1 applicatorful pv qhs x 5 days disp: 1 tube 3)  Ranitidine Hcl 150 Mg Tabs (Ranitidine hcl) .... One daily for 2 weeks then one daily as needed for stomach pain and  pain wtih swallow  [id number])  Patient Instructions: 1)  Recommend nasal spray of saline, to clear drainage from passages.  Once the drainage stops your cough will also stop. 2)  Vaginal discharge is consistent wtih bacterial vagnosis, this is not an STD.  You may use a gel for 5 nights this will treat the problem.  You may also wait until after your period and it may go away on its own. Prescriptions: RANITIDINE HCL 150 MG TABS (RANITIDINE HCL) one daily for 2 weeks then one daily as needed for stomach pain and pain wtih swallow  #30 x  3   Entered and Authorized by:   Tereasa Coop NP   Signed by:   Tereasa Coop NP on 10/10/2009   Method used:   Electronically to        [facility] [address] [phone]* (retail)       [address]       [place], [place]  [address]       Ph: [phone] or [phone]       Fax: [phone]   RxID:   [id number] METROGEL-VAGINAL 0.75 % GEL (METRONIDAZOLE) 1 applicatorful PV qHS x 5 days disp: 1 tube  #1 x 1   Entered and Authorized by:   Tereasa Coop NP   Signed by:   Tereasa Coop NP on 10/10/2009   Method used:   [facility] to        [facility] [address] [phone]* (retail)       [place] [address]       [place],   [address]       Ph: [phone] or [phone]       Fax: [phone]   RxID:   [id number]   Laboratory Results  Date/Time Received: October 10, 2009 10:26 AM  Date/Time Reported: October 10, 2009 10:49 AM   Wet [place] Source: vag WBC/hpf: 1-5 Bacteria/hpf: 3+  Cocci Clue cells/hpf: moderate  Positive whiff Yeast/hpf: none Trichomonas/hpf: none Comments: .......test performed by........Marland Kitchen Amado Nash, SMA

## 2010-09-23 NOTE — Progress Notes (Signed)
----   Converted from flag ---- ---- 07/26/2007 1:33 PM, Dixon Boos MD wrote: this pt is on my new OB schedule.  I believe she takes enalapril for HTN and kidney disease.  Please ask her to stop this med as it is teratogenic. Thanks, [facility] ------------------------------  mesage left on voicemail to stop enalapril and to call office tomorrow to discuss.

## 2010-09-23 NOTE — Miscellaneous (Signed)
Summary: dnka/new ob/ts   Clinical Lists Changes

## 2010-09-23 NOTE — Miscellaneous (Signed)
Summary: f/u OV yesterday   Clinical Lists Changes LM asking her to call me back. need to know how she is doing.Elige Radon RN  February 04, 2010 8:47 AM states she is doing fine now. wanted to know if she should take all meds at the same time as it made her stomach hurt. suggested taking with a meal & taking them at different times. if this does not help, call us back. she agreed with this.Elige Radon RN  February 04, 2010 3:58 PM

## 2010-09-23 NOTE — Miscellaneous (Signed)
Summary: abnormal labs rec'd   Clinical Lists Changes rec'd fax from [facility] about lab results.  High urea & creatinine. low CO2 & calcium. to pcp.Elige Radon RN  September 04, 2009 9:27 AM   Patient with known ESRD.  Followed by renal. Has had graft placed for impending dialysis.  Orland Mustard  MD  September 04, 2009 10:32 AM  Observations: Added new observation of CALCIUM: 7.9 mg/dL (09/04/2009 9:26) Added new observation of ALBUMIN: 4.0 g/dL (09/04/2009 9:26) Added new observation of PROTEIN, TOT: 7.1 g/dL (09/04/2009 9:26) Added new observation of SGPT (ALT): 32 units/L (09/04/2009 9:26) Added new observation of SGOT (AST): 24 units/L (09/04/2009 9:26) Added new observation of ALK PHOS: 67 units/L (09/04/2009 9:26) Added new observation of BILI TOTAL: 0.6 mg/dL (09/04/2009 9:26) Added new observation of CREATININE: 9.0 mg/dL (09/04/2009 9:26) Added new observation of BUN: 81 mg/dL (09/04/2009 9:26) Added new observation of BG RANDOM: 78 mg/dL (09/04/2009 9:26) Added new observation of CO2 PLSM/SER: 18 meq/L (09/04/2009 9:26) Added new observation of CL SERUM: 109 meq/L (09/04/2009 9:26) Added new observation of K SERUM: 4.1 meq/L (09/04/2009 9:26) Added new observation of NA: 140 meq/L (09/04/2009 9:26)

## 2010-09-23 NOTE — Letter (Signed)
Summary: Results Follow Up Letter  [place]  [address]   [place], [place] [address]   Phone: [phone]  Fax: [phone]    08/19/2007 MRN: [id number]  [address] [place],   [address]  Dear Ms. Donigan,   The following are the results of your recent test(s):  Test     Result     Pap Smear    Normal_____x__  Not Normal_____       Comments: _________________________________________________________    _________________________________________________________  Please call [phone] for an appointment Or _________________________________________________________ _________________________________________________________ _________________________________________________________  Sincerely,  Dixon Boos MD [place]          Appended Document: Results Follow Up Letter letter sent via mail ..................Marland KitchenDelores Pate-Gaddy, CMA (AAMA)

## 2010-09-23 NOTE — Assessment & Plan Note (Signed)
Summary: new ob appt   OB Initial Intake Information    Positive HCG by: self    Race: Black    Marital status: Single    Occupation: [employer]    Type of work: works from home    Number of children at home: [facility] of delivery: [facility]    Newborn's physician: Dr. Dixon Boos  FOB Information    Husband/Father of baby: Carolan Shiver    FOB occupation unemployed  Menstrual History    LMP (date): 05/10/2007    [place] by LMP: 02/14/2008    Best Working EDC: 02/23/2008    LMP - Character: normal    LMP - Reliable? : approximate (month known)    Menarche: 13 years    Menses interval: 28 days    Menstrual flow 4 days    On BCP's at conception: no    Pre Pregnancy Weight: 117 lbs.    Symptoms since LMP: nausea, vomiting, tender breasts  Past Medical History:    Reviewed history from 10/21/2006 and no changes required:       baseline Cr 2.5       chronic glomerulonephritis       24 hr protein 1670 - 06/24/2004       h/o genital herpes  Past Surgical History:    Reviewed history from 10/21/2006 and no changes required:       Renal Biopsy-unsuccessful - 08/24/1997  History of Present Illness:     Past Pregnancy History    Gravida:     4    Term Births:     2    Premature Births:   0    Living Children:   2    Para:       2    Mult. Births:     0    Prev. VBAC attempt?   none    Aborta:     0    Elect. Ab:     0    Spont. Ab:     0    Ectopics:     1  Pregnancy # 1    Delivery date:     2001    Comments:     ectopic pregnancy, treated with methotrexate, no D&C  Pregnancy # 2    Delivery date:     10/07/2001    Weeks Gestation:   40    Preterm labor:     no    Delivery type:     NSVD    Hours of labor:     7    Anesthesia type:     none    Delivery location:     [facility]    Infant Sex:     Female    Birth weight:     7 pounds 9 ounces    Name:     Jaylen     Comments:     induced for PIH, not on any BP meds,  no  pre-eclampsia  Pregnancy # 3    Delivery date:     06/27/2004    Weeks Gestation:   40    Preterm labor:     no    Delivery type:     NSVD    Hours of labor:     12    Anesthesia type:     none    Delivery location:     [facility]    Infant Sex:  Female    Birth weight:     5 pounds 12 ounces    Name:     Keon  Pregnancy # 4    Comments:     current pregnancy  Family History:    Father died at 2 yo with complications of type I DM    Mother healthy witrh anemia.  Social History:    Lives with her two son and boyfriend - stays home to take care of kids.  No tobacco, etoh or drug use.    Risk Factors:  Tobacco use:  never  Genetic History    Father of baby:   Carolan Shiver     Thalassemia:     mother: no   father: no    Neural tube defect:   mother: no   father: no    Down's Syndrome:   mother: no   father: no    Tay-Sachs:     mother: no   father: no    Sickle Cell Dz/Trait:   mother: no   father: no    Hemophilia:     mother: no   father: no    Muscular Dystrophy:   mother: no   father: no    Cystic Fibrosis:   mother: no   father: no    Huntington's Dz:   mother: no   father: no    Mental Retardation:   mother: no   father: no    Fragile X:     mother: no   father: no    Other Genetic or       Chromosomal Dz:   mother: no   father: no    Child with other       birth defect:     mother: no   father: no    > 3 spont. abortions:   mother: no    Hx of stillbirth:     mother: no  Warehouse manager for Follow-up Visit    Estimated weeks of       gestation:     12 0/7    Weight:     130    Blood pressure:   150 / 100  Physical Examination  Vital Signs:  BP (upright): 150/100  Wt: 130    General Exam:  Constitutional:    alert, no acute distress, well hydrated, and appropriate dress.   Skin:    normal turgor.   Head:    atraumatic and normocephalic.   Mouth:    good dentition, no erythema, and no exudates.   Neck:    supple, no adenopathy, and  THYROID ENLARGED.   Breasts:    skin/areolae normal, no masses, no nipple discharge, and no erythema/warmth/tenderness.   Cardiovascular:    RRR, no murmurs, and no edema.   Respiratory:    no respiratory distress and clear to auscultation.   Neurologic:    normal.   Psychiatric:    affect and mood appropriate, normal interaction, and good eye contact.    Pelvic Exam:  Vulva:    normal appearance and normal hair distribution.   Vagina:    normal, rugated, and no lesions.   Cervix:    normal, midposition, parous, and no motion tenderness.   Uterus:    size c/w dates.   Adnexa:    normal, no masses, and nontender.    Pelvimetry:  Impression:  proven by delivery.    Impression & Recommendations:  Problem # 1:  PREGNANCY, MULTIGRAVIDA (ICD-V22.1) discussed  with Dr. 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  Prenatal labs all done and reviewed.  Dating ultrasound reviewed, needs complete anatomy scan 16-18 weeks. Orders: Other OB visit- [place] ([place])   Problem # 2:  GLOMERULONEPHRITIS, CHRONIC (ICD-582.9) baseline proteinuria, maintained on ACEi, but stopped prior to contraception.  Needs CLOSE f/u with renal MD throughout pregnancy.  Las Creatinine on file 05/2006 was 3.7 per Dr. Macky Lower @ [facility] with protein/creatinine ration of 66.  Problem # 3:  SCREENING FOR MALIGNANT NEOPLASM OF THE CERVIX (ICD-V76.2) pap smear done- no prior abnl pap smears Orders: Pap Smear-[facility] [id number])   Problem # 4:  GOITER, UNSPECIFIED (ICD-240.9)  Orders: TSH-[facility] [id number])   Problem # 5:  VAGINAL DISCHARGE (ICD-623.5) + yeast and clue cells on wet prep.  Rx with diflucan 150mg  x1 and metrogel two times a day x 7 days (pregnancy class B). [facility] [id number])   Problem # 6:  BENIGN ESSENTIAL HYPERTENSION ANTEPARTUM (ICD-642.03) encouraged her to take labetalol twice daily  EVERY day.  Gave prescription for blood pressure monitor to keep track of BP at home.  Problem # 7:  ANEMIA (ICD-285.9) non-microcytic.  Will need further anemia lab panel.  Likely related to chronic kidney disease.  ? candidate for aranesp?  Problem # 8:  RENAL INSUFFICIENCY, CHRONIC (ICD-586) will likely need BMET to assess renal fxn at first high risk clinic visit.  last known Cr was 2.7 in October 2007.  Problem # 9:  PROTEINURIA (ICD-791.0) baseline proteinuria.  Had 100 protein on U/A in October and November MAU visits.  Also + protein on dipstick today, but I cannot remember how much.  Complete Medication List: 1)  Fluconazole 150 Mg Tabs (Fluconazole) .Marland Kitchen.. 1 tablet by mouth daily 2)  Metrogel 1 % Gel (Metronidazole) .... Insert 1 applicator full into vagina twice daily for 7 days- dispense 60gm  PAP Screening:    Hx Cervical Dysplasia in last 5 yrs? No    Last PAP smear:  07/29/2005    Reviewed PAP smear recommendations:  PAP smear done  PAP Smear Results:    Date of Exam:  08/11/2007  Osteoporosis Risk Assessment:  Risk Factors for Fracture or Low Bone Density:   Smoking status:       never  Patient Instructions: 1)  Take a prenatal vitamin every day. 2)  Take your labetaolol TWICE daily as prescribed for high blood pressure. 3)  You will be contacted for an appointment at [facility].  Please Call family practice if you do not receive notice of an appointment within 1 week. Prenatal Visit    FOB name: Carolan Shiver Concerns noted: has h/o chronic glomerulonephritis since at least age 79, followed by Dr. Macky Lower in [facility] (previously Dr. Florene Glen at [facility] but got discharged from practice for missing appts).  Has baseline HTN, proteinuria, and most recent creatinine of 3.7 in 05/2006.  She is maintained on enalapril 2.5mg  daily.  She stopped the enalapril before conception since she was trying to get pregnant.  She had a MAU visit  05/2007  and was prescribed labetalol (unsure of dose) for HTN, however, has not been taking consistently twice daily. EDC Confirmation:    New working [facility]: 02/23/2008    LMP reliable? approximate (month known)    Last menses onset (LMP) date: 05/10/2007    EDC by LMP: 02/14/2008 Ultrasound Dating Information:  First U/S on 06/23/2007   Gest age: 62 w 0 d   EDC: 02/23/2008.   OB Ultrasound Data Entry:    Ultrasound Date:     06/23/2007    Fetal number:         1 Dating:    Gestational age:     33 weeks, 0 days    EDC by sonogram:     02/23/2008 Best Working EDC:    02/23/2008  Laboratory Results  Date/Time Received: August 11, 2007 5:00  PM  Date/Time Reported: August 11, 2007 5:06 PM   Wet [place]/KOH Source: vag WBC/hpf: >20 Bacteria/hpf: 3+  rods & cocci Clue cells/hpf: moderate  Positive whiff Yeast/hpf: few Trichomonas/hpf: none Comments: ...............test performed by......Marland KitchenBonnie A. Martinique, [place] (ASCP)    Prenatal Lab Monitoring, Entry, and Tracking  Initial Prenatal Lab:  TEST                  VALUE          DATE           REVIEWED  Blood Type:           O+             08/08/2007      D (Rh) Type:          +              08/08/2007      Antibody screen:      negative       08/08/2007      Hgb:                  10.8           08/08/2007      Hct:                  32.8           08/08/2007      Rubella:              immune         08/08/2007      VDRL:                 negative       08/08/2007      Urine Culture:        insignificant growth (< 2000 colonies)08/08/2007      HBsAg:                negative       08/08/2007      HIV:                  negative       08/08/2007       Optional Lab:  TEST                  VALUE          DATE           REVIEWED  Hgb electrophoresis:  normal                        yes Chlamydia:            negative       07/23/2007     yes GC culture:           negative       07/23/2007     yes

## 2010-09-23 NOTE — Assessment & Plan Note (Signed)
Summary: UTI?,df   Vital Signs:  Patient profile:   35 year old female Height:      67 inches Weight:      115.4 pounds BMI:     18.14 Temp:     98.5 degrees F oral Pulse rate:   92 / minute BP sitting:   110 / 70  (right arm)  Vitals Entered By: Mauricia Area CMA, (June 02, 2010 3:32 PM) CC: dysuria x 3 weeks. has not been sexually active. Is Patient Diabetic? No Pain Assessment Patient in pain? no        Primary Care Provider:  Dorthey Sawyer MD  CC:  dysuria x 3 weeks. has not been sexually active.Candace Griffin  History of Present Illness: 35 yo female with Stage V CKD on HD here with dysuria.  Present for the last 3 weeks.  Stable.  Burning with urination.  Also with minimal vaginal dischage, not malodorous.  No fevers/chills, no N/V/D/C, no abd pain.  Some low back pain across entire low back but does not lateralize.  No radiation.  No change in color/consistency of urine however she only voids 1-2 times a day with her ESRD and had already voided today.  Pt has had BV and yeast infections before and doesn't think its that.  Habits & Providers  Alcohol-Tobacco-Diet     Tobacco Status: never  Current Medications (verified): 1)  Ranitidine Hcl 150 Mg Tabs (Ranitidine Hcl) .... One Daily For 2 Weeks Then One Daily As Needed For Stomach Pain and Pain Wtih Swallow 2)  Flagyl 500 Mg Tabs (Metronidazole) .... One By Mouth Two Times A Day X 7 Days 3)  Hydrocodone-Acetaminophen 5-325 Mg Tabs (Hydrocodone-Acetaminophen) .Candace Griffin.. 1 By Mouth Up To 4 Times Per Day As Needed For Pain 4)  Zofran 4 Mg Tabs (Ondansetron Hcl) .Candace Griffin.. 1 By Mouth Every 8 Hours As Needed For Nausea 5)  Fluconazole 100 Mg Tabs (Fluconazole) .... Take 1 Tab By Mouth Today and Then One in Four Days If Not Better  Allergies (verified): No Known Drug Allergies  Review of Systems       See HPI  Physical Exam  General:  Well-developed,well-nourished,in no acute distress; alert,appropriate and cooperative throughout  examination Lungs:  Normal respiratory effort, chest expands symmetrically. Lungs are clear to auscultation, no crackles or wheezes. Heart:  Normal rate and regular rhythm. S1 and S2 normal without gallop, murmur, click, rub or other extra sounds. Abdomen:  Bowel sounds positive,abdomen soft and non-tender without masses, organomegaly or hernias noted.  No CVAT. Extremities:  No clubbing, cyanosis, edema, or deformity noted with normal full range of motion of all joints.   Skin:  Intact without suspicious lesions or rashes   Impression & Recommendations:  Problem # 1:  DYSURIA (ICD-788.1) Assessment New Ddx includes vaginitis/cystitis.  Doubt back pain is related. Pt tried to void for hours, we gave her water, we tried catheterizing her with multiple I/O caths.  The patient simply had no urine in her bladder.  She is non-toxic and afebrile and as the lab is closed now she will come back tomorrow morning with a full bladder.   She desires to see Dr. Earley Favor. U/A, wet prep, GC/Chlam can all be considered at this time. Looking back at her records she comes in frequently for these symptoms but has had negative wet preps each time however she has been treated.  Would consider other causes of vaginitis if testing unremarkable (atrophic vaginitis, irritant vaginitis).  Her updated medication list for  this problem includes:    Flagyl 500 Mg Tabs (Metronidazole) ..... One by mouth two times a day x 7 days  Orders: Urinalysis-[facility] (00000) [place]- Est Level  3 [id number])  Complete Medication List: 1)  Ranitidine Hcl 150 Mg Tabs (Ranitidine hcl) .... One daily for 2 weeks then one daily as needed for stomach pain and pain wtih swallow 2)  Flagyl 500 Mg Tabs (Metronidazole) .... One by mouth two times a day x 7 days 3)  Hydrocodone-acetaminophen 5-325 Mg Tabs (Hydrocodone-acetaminophen) .Candace Griffin.. 1 by mouth up to 4 times per day as needed for pain 4)  Zofran 4 Mg Tabs (Ondansetron hcl) .Candace Griffin.. 1 by mouth every 8 hours  as needed for nausea 5)  Fluconazole 100 Mg Tabs (Fluconazole) .... Take 1 tab by mouth today and then one in four days if not better  Patient Instructions: 1)  Great to meet you, 2)  Make an appt with Dr. Earley Favor tomorrow morning.   3)  Make sure that they check a urinalysis and wet prep tomorrow. 4)  -Dr. Dianah Field.  Appended Document: UTI?,df Of note, she has had trichomoniasis and bacterial vaginosis months ago.  Appended Document: UTI?,df I am on SDAs tomorrow morning, will see her at 10:30.

## 2010-09-23 NOTE — Progress Notes (Signed)
[facility]/ Juluis Rainier /TS   Phone Note Call from Patient Call back at [phone]   Summary of Call: Pt needs to speak with someone asap about getting [facility] Initial call taken by: Drucie Ip,  September 02, 2007 2:02 PM  Follow-up for Phone Call        called pt. lmvm with # for high [facility] clinic. referral was faxed 12-16 and i will fax it again and also call them Follow-up by: Mauricia Area CMA,,  September 02, 2007 3:28 PM    Additional Follow-up for Phone Call Additional follow up Details #2::    faxed info to [facility] clinic again with new pt's phone number Follow-up by: Mauricia Area CMA,,  September 02, 2007 4:17 PM  called [facility]. they report that pt had appt 09-01-07 and dnka. r/s appt for 09-15-07 at 8:45am. called pt and lmvm with the appt. also re-faxed info to [facility].....................................................................[place],  September 05, 2007 10:55 AM

## 2010-09-23 NOTE — Assessment & Plan Note (Signed)
Summary: yeast per pt   Vital Signs:  Patient profile:   35 year old female Weight:      119 pounds Pulse rate:   84 / minute BP sitting:   149 / 100  (left arm)  Vitals Entered By: Mauricia Area CMA, (Jan 15, 2009 1:43 PM) CC: vaginal discharge Is Patient Diabetic? No Pain Assessment Patient in pain? no        History of Present Illness: 35yo with 2 week h/o "yeast infection."  Knows it is a yeast infection as she has had many times before.  White itchy vaginal discharge.  No odor.  Sexually active with father of baby recently, but does not want to be tested for STDs.  No abdominal pain or cramping.  Habits & Providers     Tobacco Status: never  Past History:  Past Medical History:    chronic glomerulonephritis, stage 4 CKD, followed by Dr. Justin Mend [facility])    24 hr protein 1670 - 06/24/2004    h/o genital herpes  Past Surgical History:    Renal Biopsy-unsuccessful - 08/24/1997    R AVF placed 09/2008 in anticipation of future dialysis (Dr. Oneida Alar)  Physical Exam  General:  alert, well-developed, well-nourished, and well-hydrated.   Skin:  turgor normal, color normal, and no rashes.   Psych:  normally interactive, good eye contact, not anxious appearing, and not depressed appearing.     Impression & Recommendations:  Problem # 1:  VAGINAL DISCHARGE (ICD-623.5) Assessment New treated for yeast infection based on clinical history and previous experience with yeast infections. tx with diflucan 150mg  x 1 RTC if no better for full wet prep and STD testing (deferred today per pt's request).  Complete Medication List: 1)  Fluconazole 150 Mg Tabs (Fluconazole) .Marland Kitchen.. 1 tablet by mouth daily  Other Orders: [place]- Est Level  3 [id number]) Prescriptions: FLUCONAZOLE 150 MG  TABS (FLUCONAZOLE) 1 tablet by mouth daily  #1 x 0   Entered and Authorized by:   Dixon Boos MD   Signed by:   Dixon Boos MD on 01/15/2009   Method used:   [facility]        [facility]  [address] [phone]* (retail)       [address]       [place], [place]  [address]       Ph: [phone] or [phone]       Fax: [phone]   RxID:   [id number]

## 2010-09-23 NOTE — Progress Notes (Signed)
Summary: Req Rx   Phone Note Call from Patient Call back at Home Phone [phone]   Reason for Call: Talk to Nurse Summary of Call: pt was recently prescribed antibiotics, she is finished with taking it but now has a yeast infection, wants to know if we can prescribe her something? Initial call taken by: Samara Snide,  March 13, 2010 12:16 PM  Follow-up for Phone Call        to md Follow-up by: Elige Radon RN,  March 13, 2010 12:20 PM  Additional Follow-up for Phone Call Additional follow up Details #1::        patient should have finished antibiotics over 1 month ago. will forward to PCP, but suggest that she be evaluated. she recently had trich and gonorrhea. Additional Follow-up by: Briscoe Deutscher DO,  March 13, 2010 5:15 PM    Additional Follow-up for Phone Call Additional follow up Details #2::    LM. when she calls back-plz make her an appt Follow-up by: Elige Radon RN,  March 17, 2010 11:55 AM  Additional Follow-up for Phone Call Additional follow up Details #3:: Details for Additional Follow-up Action Taken: she will see S. Saxon at 11:15 tomorrow Additional Follow-up by: Elige Radon RN,  March 17, 2010 3:16 PM

## 2010-09-23 NOTE — Assessment & Plan Note (Signed)
Summary: yeast inf?,df   Vital Signs:  Patient profile:   35 year old female Height:      67 inches Weight:      112 pounds BMI:     17.61 BSA:     1.58 Temp:     98.4 degrees F Pulse rate:   108 / minute BP sitting:   100 / 70  Vitals Entered By: Christen Bame CMA (May 08, 2010 11:44 AM) CC: Yeast infection Is Patient Diabetic? No Pain Assessment Patient in pain? no        Primary Care Provider:  Dorthey Sawyer MD  CC:  Yeast infection.  History of Present Illness: white vaginal discharge: x 1 week, foul odor since yesterday,   no sexual activity since last treated about 1 month ago for same problem and month before that was treated for [facility] and chlamydia.  No vaginal bleeding.  No abd pain.  No fever. Occurs always the week after menstration.   Habits & Providers  Alcohol-Tobacco-Diet     Tobacco Status: never  Current Medications (verified): 1)  Ranitidine Hcl 150 Mg Tabs (Ranitidine Hcl) .... One Daily For 2 Weeks Then One Daily As Needed For Stomach Pain and Pain Wtih Swallow 2)  Flagyl 500 Mg Tabs (Metronidazole) .... One By Mouth Two Times A Day X 7 Days 3)  Hydrocodone-Acetaminophen 5-325 Mg Tabs (Hydrocodone-Acetaminophen) .Marland Kitchen.. 1 By Mouth Up To 4 Times Per Day As Needed For Pain 4)  Zofran 4 Mg Tabs (Ondansetron Hcl) .Marland Kitchen.. 1 By Mouth Every 8 Hours As Needed For Nausea 5)  Fluconazole 100 Mg Tabs (Fluconazole) .... Take 1 Tab By Mouth Today and Then One in Four Days If Not Better  Allergies (verified): No Known Drug Allergies  Past History:  Past medical, surgical, family and social histories (including risk factors) reviewed, and no changes noted (except as noted below).  Past Medical History: Reviewed history from 03/25/2009 and no changes required. chronic glomerulonephritis, stage 4 CKD, followed by Dr. Justin Mend [facility]) 24 hr protein 1670 - 06/24/2004 h/o genital herpes Right forearm AV fistula placed Feb 2010  Past Surgical  History: Reviewed history from 01/15/2009 and no changes required. Renal Biopsy-unsuccessful - 08/24/1997 R AVF placed 09/2008 in anticipation of future dialysis (Dr. Oneida Alar)  Family History: Reviewed history from 08/11/2007 and no changes required. Father died at 19 yo with complications of type I DM Mother healthy witrh anemia.  Social History: Reviewed history from 08/11/2007 and no changes required. Lives with her two son and boyfriend - stays home to take care of kids.  No tobacco, etoh or drug use.    Review of Systems       denies fever, chills, nausea, vomiting, diarrhea or constipation   Physical Exam  General:  VSS Well-developed,well-nourished,in no acute distress; alert,appropriate and cooperative throughout examination Mouth:  MMM. Lungs:  Normal respiratory effort, chest expands symmetrically. Heart:  Normal rate and regular rhythm.   Abdomen:  Bowel sounds positive,abdomen soft and non-tender without masses, organomegaly or hernias noted. Genitalia:  Pelvic Exam:        External: normal female genitalia without lesions or masses        Vagina: normal without lesions or masses        Cervix: normal without lesions or masses        Adnexa: normal bimanual exam without masses or fullness        Uterus: normal by palpation        Pap smear:  not performed wet prep and GC/Chlam obtained and sent to lab   Impression & Recommendations:  Problem # 1:   VAGINITIS, CANDIDAL (ICD-112.1) definatley yeats hx of BV, will treat for both again, told about not drinking with medication, given refill on diflucan for now, will need to try to pretreat with OTC meds when on period to try to avoid flare, may need to d more extensive measures such as birth control to avoid problem.  [facility] [phone])  Her updated medication list for this problem includes:    Fluconazole 100 Mg Tabs (Fluconazole) .Marland Kitchen... Take 1 tab by mouth today and then one in four days if not  better  Complete Medication List: 1)  Ranitidine Hcl 150 Mg Tabs (Ranitidine hcl) .... One daily for 2 weeks then one daily as needed for stomach pain and pain wtih swallow 2)  Flagyl 500 Mg Tabs (Metronidazole) .... One by mouth two times a day x 7 days 3)  Hydrocodone-acetaminophen 5-325 Mg Tabs (Hydrocodone-acetaminophen) .Marland Kitchen.. 1 by mouth up to 4 times per day as needed for pain 4)  Zofran 4 Mg Tabs (Ondansetron hcl) .Marland Kitchen.. 1 by mouth every 8 hours as needed for nausea 5)  Fluconazole 100 Mg Tabs (Fluconazole) .... Take 1 tab by mouth today and then one in four days if not better  Other Orders: [place]- Est Level  3 [id number])  Patient Instructions: 1)  Nice to meet you 2)  I am giving you the same medications again 3)  When you start your period next time try to pretreat so you do not get another infect with over the counter medications Prescriptions: FLUCONAZOLE 100 MG TABS (FLUCONAZOLE) take 1 tab by mouth today and then one in four days if not better  #2 x 1   Entered and Authorized by:   Hulan Saas DO   Signed by:   Hulan Saas DO on 05/08/2010   Method used:   [facility]ronically to        [facility] [address] [phone]* (retail)       [address]       [place], [place]  [address]       Ph: [phone] or [phone]       Fax: [phone]   RxID:   [id number] FLAGYL 500 MG TABS (METRONIDAZOLE) one by mouth two times a day x 7 days  #14 x 0   Entered and Authorized by:   Hulan Saas DO   Signed by:   Hulan Saas DO on 05/08/2010   Method used:   Electronically to        [facility] [address] [phone]* (retail)       [address]       [place], [place]  [address]       Ph: [phone] or [phone]       Fax: [phone]   RxID:   [id number]   Laboratory Results  Date/Time Received: May 08, 2010 12:02 PM  Date/Time Reported: May 08, 2010 12:06 PM   [employer] Source: Vaginal WBC/hpf:  5-10 Bacteria/hpf: 3+  [place] Clue cells/hpf: none  Negative whiff Yeast/hpf: none Trichomonas/hpf: none Comments: ...........test performed by...........Marland KitchenHedy Camara, CMA

## 2010-09-23 NOTE — Miscellaneous (Signed)
   Clinical Lists Changes  Problems: Removed problem of UNSPECIFIED VAGINITIS AND VULVOVAGINITIS (ICD-616.10) Removed problem of PROTEINURIA (ICD-791.0)      Allergies: No Known Drug Allergies

## 2010-09-23 NOTE — Assessment & Plan Note (Signed)
Summary: f/u eo   Vital Signs:  Patient profile:   35 year old female Weight:      118 pounds Temp:     98.2 degrees F oral Pulse rate:   119 / minute Pulse rhythm:   regular BP sitting:   129 / 81  (left arm) Cuff size:   regular  Vitals Entered By: Audelia Hives CMA (July 08, 2010 1:57 PM) CC: follow-up visit   Primary Care Provider:  Dorthey Sawyer MD  CC:  follow-up visit.  History of Present Illness: 35 yo female here for fu.  Vag DC:  Was here previously, mucopurulent discharge, tx for PID, symptoms improved however GC/Chlam probes negative.  Pelvic pain:  Endorses sharp pains in the mid pelvis that occur approx monthly, she is not sure if there is any association with her periods.  She agrees to note when they occur and will let me know at next visit.  Vaginal dryness:  Pain with intercourse.  No Hx sexual trauma or abuse, just painful and tight. She has never used lubricants before.  Habits & Providers  Alcohol-Tobacco-Diet     Tobacco Status: never  Current Medications (verified): 1)  Ranitidine Hcl 150 Mg Tabs (Ranitidine Hcl) .... One Daily For 2 Weeks Then One Daily As Needed For Stomach Pain and Pain Wtih Swallow 2)  Hydrocodone-Acetaminophen 5-325 Mg Tabs (Hydrocodone-Acetaminophen) .Marland Kitchen.. 1 By Mouth Up To 4 Times Per Day As Needed For Pain 3)  Zofran 4 Mg Tabs (Ondansetron Hcl) .Marland Kitchen.. 1 By Mouth Every 8 Hours As Needed For Nausea 4)  Doxycycline Hyclate 100 Mg Caps (Doxycycline Hyclate) .... One By Mouth Two Times A Day X14d 5)  Metronidazole 500 Mg Tabs (Metronidazole) .... One Tab By Mouth Two Times A Day X 14 Days, Take After Hd On Dialysis Days 6)  Fluconazole 150 Mg Tabs (Fluconazole) .... One Tab By Mouth X1 If You Develop A Yeast Infection  Allergies (verified): No Known Drug Allergies  Review of Systems       See HPI  Physical Exam  General:  Well-developed,well-nourished,in no acute distress; alert,appropriate and cooperative throughout  examination Abdomen:  Bowel sounds positive,abdomen soft and non-tender without masses, organomegaly or hernias noted. Genitalia:  Normal introitus for age, no external lesions, no vaginal discharge, mucosa pink and moist, no vaginal or cervical lesions, no vaginal atrophy, no friaility or hemorrhage, normal uterus size and position, no adnexal masses or tenderness   Impression & Recommendations:  Problem # 1:  PELVIC INFLAMMATORY DISEASE (ICD-614.9) Assessment Improved Here for TOC. Checked GC/Chlam WP negative. Checking [place] for pelvic pain, could be mittelsmerz.  Orders: [place]- Est  Level 4 (99214) Ultrasound (Ultrasound)  Problem # 2:  UNSPECIFIED VAGINITIS AND VULVOVAGINITIS (ICD-616.10) Assessment: Unchanged See #1.  Her updated medication list for this problem includes:    Doxycycline Hyclate 100 Mg Caps (Doxycycline hyclate) ..... One by mouth two times a day x14d    Metronidazole 500 Mg Tabs (Metronidazole) ..... One tab by mouth two times a day x 14 days, take after hd on dialysis days  [facility]: [facility] [id number]) GC/Chlamydia-[facility] (87591/87491)  Problem # 3:  DYSPAREUNIA (ICD-625.0) Unclear etiology. Will check FSH/LH/Estradiol/testosterone. Consider estrogen cream if hypogonadal. Commercial personal lubricant can also be used should she desire. Checking [place].  Orders: [facility]- Est  Level 4 [id number]) Miscellaneous Lab Charge-[facility] [id number]) FSH-[facility] [id number]) LH-[facility] [id number])  Complete Medication List: 1)  Ranitidine Hcl 150 Mg Tabs (Ranitidine hcl) .... One daily for 2 weeks then  one daily as needed for stomach pain and pain wtih swallow 2)  Hydrocodone-acetaminophen 5-325 Mg Tabs (Hydrocodone-acetaminophen) .Marland Kitchen.. 1 by mouth up to 4 times per day as needed for pain 3)  Zofran 4 Mg Tabs (Ondansetron hcl) .Marland Kitchen.. 1 by mouth every 8 hours as needed for nausea 4)  Doxycycline Hyclate 100 Mg Caps (Doxycycline hyclate) .... One by mouth two times a day x14d 5)  Metronidazole  500 Mg Tabs (Metronidazole) .... One tab by mouth two times a day x 14 days, take after hd on dialysis days 6)  Fluconazole 150 Mg Tabs (Fluconazole) .... One tab by mouth x1 if you develop a yeast infection  Patient Instructions: 1)  Great to see you, 2)  Astroglide is the name of the personal lubricant. 3)  Checking hormone levels. 4)  Wet Prep. 5)  GC/Chlam tests. 6)  Will let you know if any are positive. 7)  Try to time your pelvic pains with your cycles. 8)  -Dr. Darene Lamer.   Orders Added: 1)  [facility]- Est  Level 4 [99214] 2)  Miscellaneous Lab [place] [99999] 3)  Wet Prep- [place] [87210] 4)  GC/Chlamydia-[facility] [87591/87491] 5)  Ultrasound [Ultrasound] 6)  FSH-[facility] [id number] 7)  LH-[facility] [id number]    Laboratory Results  Date/Time Received: July 08, 2010 2:46 PM  Date/Time Reported: July 08, 2010 2:59 PM   Wet [place] Source: vag WBC/hpf: >20 Bacteria/hpf: 3+ cocci and rods Clue cells/hpf: none  Negative whiff Yeast/hpf: none Trichomonas/hpf: none Comments: ...............test performed by......Marland KitchenBonnie A. Martinique, MLS (ASCP)cm

## 2010-09-23 NOTE — Assessment & Plan Note (Signed)
Summary: CPE,df   Vital Signs:  Patient profile:   35 year old female Height:      67 inches Weight:      120.38 pounds BMI:     18.92 Temp:     97.3 degrees F oral Pulse rate:   79 / minute Pulse rhythm:   regular BP sitting:   130 / 85  (right arm)  Vitals Entered By: Janeth Rase LPN (August  2, 624THL 11:47 AM) CC: CPE and pap Is Patient Diabetic? No Pain Assessment Patient in pain? no        Primary Care Provider:  Orland Mustard  MD  CC:  CPE and pap.  History of Present Illness: Candace Griffin comes in today for cpe.  She also has complaints of vaginal discharge and right ear pain. 1) Vaginal discharge - x 1 week now.  malodorous.  yellowish.  started after bubble bath, which she has been sensitive to in past.  Has had BV inpast and says it seems like that.  No sex x 3 months. 2) Ear pain - a few weeks ago felt like ears plugged with wax so used bobby pin to try to get it out.  Then developed sharp pain in that ear, decreaed hearing, and discharge.  Now using wet Q-tip.  No fevers.  NO facial swelling.  3) PE - no history of abnormal paps.  Last pap approx 2 years ago when initiating prenatal care for her last child.  was not done here.   Past History:  Past Surgical History: Last updated: 01/15/2009 Renal Biopsy-unsuccessful - 08/24/1997 R AVF placed 09/2008 in anticipation of future dialysis (Dr. Oneida Alar)  Family History: Last updated: 08-15-2007 Father died at 34 yo with complications of type I DM Mother healthy witrh anemia.  Social History: Last updated: Aug 15, 2007 Lives with her two son and boyfriend - stays home to take care of kids.  No tobacco, etoh or drug use.    Past Medical History: chronic glomerulonephritis, stage 4 CKD, followed by Dr. Justin Mend [facility]ney) 24 hr protein 1670 - 06/24/2004 h/o genital herpes Right forearm AV fistula placed Feb 2010  Physical Exam  General:  alert, well-developed, well-nourished, and well-hydrated.  NAD,  cooperative to exam vitals reviewed Eyes:  corneas and lenses clear and no injection.   Ears:  L ear normal.   Right ear canal erythematous and edematous with ear wax and whitish discharge.  Canal swollen to point that TM only partially visible though did not appear inflamed. Mouth:  Oral mucosa and oropharynx without lesions or exudates.  Teeth in good repair. Breasts:  No mass, nodules, thickening, tenderness, bulging, retraction, inflamation, nipple discharge or skin changes noted.   Lungs:  Normal respiratory effort, chest expands symmetrically. Lungs are clear to auscultation, no crackles or wheezes. Heart:  RRR, no murmurs, and no edema.   Abdomen:  Bowel sounds positive,abdomen soft and non-tender without masses, organomegaly or hernias noted. Genitalia:  Normal introitus for age, no external lesions, moderate amount of yellowish discharge, mucosa pink and moist, no vaginal lesions, cervix with circuferential irritation/erythema, no vaginal atrophy, no friaility or hemorrhage, normal uterus size and position, no adnexal masses or tenderness Pulses:  2+ radial and dp pulses Extremities:  good thrill in right AVF. no edema in LEs Skin:  Intact without suspicious lesions or rashes Cervical Nodes:  No lymphadenopathy noted Axillary Nodes:  No palpable lymphadenopathy Psych:  Oriented X3, memory intact for recent and remote, normally interactive, good eye contact,  not anxious appearing, and not depressed appearing.     Impression & Recommendations:  Problem # 1:  ROUTINE GYNECOLOGICAL EXAMINATION (ICD-V72.31) Assessment New  Pap smear obtained.  GC/Chlamydia and wet prep also obtained.  Not due for mammogram.  Orders: [place] - Est  18-39 yrs [id number])  Problem # 2:  OTITIS EXTERNA, ACUTE, RIGHT (ICD-380.12) Assessment: New Appears to have acute OE.  Will treat with polytrim/neomycin/dexamethasone.  Return in 2 weeks for recheck.   Problem # 3:  VAGINITIS, BACTERIAL  (ICD-616.10) Assessment: New Wet prep c/w bacterial vaginosis.  Patient does not like taste of flagyl.  Prefers metrogel vaginal cream. Her updated medication list for this problem includes:    Metrogel-vaginal 0.75 % Gel (Metronidazole) .Marland Kitchen... 1 applicatorful pv qhs x 5 days disp: 1 tube  Complete Medication List: 1)  Neomycin-polymyxin-dexameth 5-10000-0.1 Susp (Neomycin-polymyxin-dexameth) .Marland Kitchen.. 1 drop in right ear 4 times daily x 2 weeks 1 bottle 2)  Metrogel-vaginal 0.75 % Gel (Metronidazole) .Marland Kitchen.. 1 applicatorful pv qhs x 5 days disp: 1 tube  Other Orders: GC/Chlamydia-[facility] (87591/87491) Pap Smear-[facility] [id number]) [facility] [id number])  Patient Instructions: 1)  Prior to using the ear drops, roll up a kleenex like we discussed and dab it in your ear to clear drainage and debris.  THen put 1-2 drops in the ear.  Do this 4 times a day for the next 2 weeks. 2)  For the bacterial vaginosis, use the metrogel at night for 5 days. 3)  Please return in 2 weeks so I can recheck your ear.  Prescriptions: METROGEL-VAGINAL 0.75 % GEL (METRONIDAZOLE) 1 applicatorful PV qHS x 5 days disp: 1 tube  #1 x 1   Entered and Authorized by:   Orland Mustard  MD   Signed by:   Orland Mustard  MD on 03/25/2009   Method used:   Print then Give to Patient   RxID:   [id number] NEOMYCIN-POLYMYXIN-DEXAMETH 5-10000-0.1 SUSP [facility]) 1 drop in right ear 4 times daily x 2 weeks 1 bottle  #1 x 1   Entered and Authorized by:   Orland Mustard  MD   Signed by:   Orland Mustard  MD on 03/25/2009   Method used:   Print then Give to Patient   RxID:   [id number]   Laboratory Results  Date/Time Received: March 25, 2009 11:55 PM  Date/Time Reported: March 25, 2009 12:23 PM   [employer] Source: vaginal WBC/hpf: 10-20 Bacteria/hpf: 3+  Cocci Clue cells/hpf: few  Negative whiff Yeast/hpf: none Trichomonas/hpf: none Comments: ...........test performed by...........Marland KitchenHedy Camara,  CMA

## 2010-09-23 NOTE — Letter (Signed)
Summary: New OB  New OB   Imported By: Junie Panning LEVAN 08/23/2007 09:14:28  _____________________________________________________________________  External Attachment:    Type:   Image     Comment:   External Document

## 2010-09-23 NOTE — Consult Note (Signed)
Summary: [facility]  [facility]   Imported By: Drucie Ip 01/31/2007 14:49:52  _____________________________________________________________________  External Attachment:    Type:   Image     Comment:   External Document

## 2010-09-23 NOTE — Letter (Signed)
Summary: Handout Printed  Printed Handout:  - Pelvic Inflammatory Disease (PID)

## 2010-09-23 NOTE — Assessment & Plan Note (Signed)
Summary: BV?,df(resch fro Dr. Amie Critchley sched)bmc   Vital Signs:  Patient profile:   35 year old female Height:      67 inches Weight:      117 pounds BMI:     18.39 Temp:     98.2 degrees F oral Pulse rate:   87 / minute BP sitting:   131 / 84  (left arm) Cuff size:   regular  Vitals Entered By: Schuyler Amor CMA (February 03, 2010 1:48 PM) CC: abdominal pain x 3 days Is Patient Diabetic? No Pain Assessment Patient in pain? yes     Location: abdomen Intensity: 8   Primary Care Provider:  Orland Mustard  MD  CC:  abdominal pain x 3 days.  History of Present Illness: 35 year old AAF with PMHx ESRD 2/2 glomerulonephritis:  1. Abdominal Pain: started 4 days ago, colicky, intermittent, "worse than labor," pain is generalized but worse suprapubic, denies fever/chills, diarrhea. Has nausea, dysuria, vaginal discharge, constipation, some back pain. Hx of CKD, stage 4. sexually active with 1 partner, does not always use condoms. LMP 5/16.   Habits & Providers  Alcohol-Tobacco-Diet     Tobacco Status: never  Current Medications (verified): 1)  Ranitidine Hcl 150 Mg Tabs (Ranitidine Hcl) .... One Daily For 2 Weeks Then One Daily As Needed For Stomach Pain and Pain Wtih Swallow 2)  Flagyl 500 Mg Tabs (Metronidazole) .... One By Mouth Two Times A Day X 7 Days 3)  Cephalexin 500 Mg  Tabs (Cephalexin) .... One By Mouth Two Times A Day X 7 Days 4)  Hydrocodone-Acetaminophen 5-325 Mg Tabs (Hydrocodone-Acetaminophen) .Marland Kitchen.. 1 By Mouth Up To 4 Times Per Day As Needed For Pain 5)  Zofran 4 Mg Tabs (Ondansetron Hcl) .Marland Kitchen.. 1 By Mouth Every 8 Hours As Needed For Nausea  Allergies (verified): No Known Drug Allergies PMH-FH-SH reviewed for relevance  Review of Systems      See HPI  Physical Exam  General:  Alert, well-developed, well-nourished, well-hydrated, and moderate distress.  Vitals reviewed. Mouth:  MMM. Neck:  No deformities, masses, or tenderness noted. Lungs:  Normal respiratory  effort and normal breath sounds.   Heart:  Normal rate and regular rhythm.   Abdomen:  Soft, normal bowel sounds, no distention, and no masses, no rebound. Suprapubic, RLQ, LLQ tenderness to palpation.  Genitalia:  Normal introitus, no external lesions, normal uterus size and position, no adnexal masses or tenderness, and frothy green vaginal discharge.  No cervical motion tenderness. Pulses:  2+ radial and dp pulses. Extremities:  No edema LE. Skin:  Intact without suspicious lesions or rashes.   Impression & Recommendations:  Problem # 1:  ABDOMINAL PAIN, ACUTE (ICD-789.00) Assessment New Patient initially with moderate to severe abdominal pain that was controlled with 5 mg Morphine IM. She had one episode of N/V while in the clinic and treated with Zofran. Labs were sent STAT and results obtained prior to patient discharge. Pelvic exam with vaginal/cervical discharge. Samples obtained. + Trichomonas. + LE, WBC on UA. Treated with Azithromycin, Keflex (patient with CKD, stage 4), and Flagyl for Trichomonas, GC/Chlamydia (presumed), as well as covering for UTI. UPreg negative. Patient observed for 2 hours in office, which marked improvement in pain. No more episodes of N/V. She was informed of her results and need for Abx as well as treatment of partner. She was given Percocet for pain with instructions for bowel regimen (fiber, Miralax) and Zofran for any future N/V. Most importantly, she was given RED FLAGS  for going to the ED. Orders: Urinalysis-[facility] (00000) Comp Met-[facility] [phone]) CBC w/Diff-[facility] [id number]) Lipase-[facility] [id number]) Wet Prep- [facility] [id number]) GC/Chlamydia-[facility] (87591/87491) Zofran 4mg  Tab (EMRORAL) Azithromycin oral (Q0144) [facility]- Est  Level 4 [id number])  Problem # 2:  TRICHOMONAL INFECTION (ICD-131.9) Assessment: New  Orders: [place]- Est  Level 4 [id number])  Problem # 3:  UTI (ICD-599.0) Assessment: New  Her updated medication list for this problem includes:    Flagyl 500 Mg  Tabs (Metronidazole) ..... One by mouth two times a day x 7 days    Cephalexin 500 Mg Tabs (Cephalexin) ..... One by mouth two times a day x 7 days  Orders: [facility]- Est  Level 4 [id number])  Problem # 4:  RENAL INSUFFICIENCY, CHRONIC (ICD-586) Assessment: Unchanged  Orders: [place]- Est  Level 4 [id number])  Complete Medication List: 1)  Ranitidine Hcl 150 Mg Tabs (Ranitidine hcl) .... One daily for 2 weeks then one daily as needed for stomach pain and pain wtih swallow 2)  Flagyl 500 Mg Tabs (Metronidazole) .... One by mouth two times a day x 7 days 3)  Cephalexin 500 Mg Tabs (Cephalexin) .... One by mouth two times a day x 7 days 4)  Hydrocodone-acetaminophen 5-325 Mg Tabs (Hydrocodone-acetaminophen) .Marland Kitchen.. 1 by mouth up to 4 times per day as needed for pain 5)  Zofran 4 Mg Tabs (Ondansetron hcl) .Marland Kitchen.. 1 by mouth every 8 hours as needed for nausea  Other Orders: U Preg-[facility] [id number])  Patient Instructions: 1)  If your abdominal pain gets worse, please go to the emergency department. 2)  You have trichomonas, which is a sexually transmitted disease. 3)  You have a urinary tract infection. Prescriptions: ZOFRAN 4 MG TABS (ONDANSETRON HCL) 1 by mouth every 8 hours as needed for nausea  #12 x 0   Entered and Authorized by:   Briscoe Deutscher DO   Signed by:   Briscoe Deutscher DO on 02/03/2010   Method used:   Print then Give to Patient   RxID:   [id number] HYDROCODONE-ACETAMINOPHEN 5-325 MG TABS (HYDROCODONE-ACETAMINOPHEN) 1 by mouth up to 4 times per day as needed for pain  #30 x 0   Entered and Authorized by:   Briscoe Deutscher DO   Signed by:   Briscoe Deutscher DO on 02/03/2010   Method used:   Print then Give to Patient   RxID:   [id number] CEPHALEXIN 500 MG  TABS (CEPHALEXIN) one by mouth two times a day x 7 days  #14 x 0   Entered and Authorized by:   Briscoe Deutscher DO   Signed by:   Briscoe Deutscher DO on 02/03/2010   Method used:   Print then Give to Patient   RxID:   [id number] FLAGYL  500 MG TABS (METRONIDAZOLE) one by mouth two times a day x 7 days  #14 x 0   Entered and Authorized by:   Briscoe Deutscher DO   Signed by:   Briscoe Deutscher DO on 02/03/2010   Method used:   Print then Give to Patient   RxID:   [id number]    Medication Administration  Injection # 1:    Medication: Morphine Sulfate inj 10 mg    Diagnosis: ABDOMINAL PAIN, ACUTE (ICD-789.00)    Route: IM    Site: LUOQ gluteus    Exp Date: 05/25/2011    Lot #: [id number]    Mfr: [email or web address]    Comments: 5 MG IM  GIVEN    Patient tolerated injection without complications    Given  by: Mauricia Area CMA, (February 03, 2010 2:15 PM)  Medication # 1:    Medication: Zofran 4mg  Tab    Diagnosis: ABDOMINAL PAIN, ACUTE (ICD-789.00)    Dose: 4MG     Route: po    Exp Date: 12/23/2010    Lot #: [id number]    Mfr: [email or web address]    Patient tolerated medication without complications    Given by: Mauricia Area CMA, (February 03, 2010 3:23 PM)  Medication # 2:    Medication: Azithromycin oral    Diagnosis: ABDOMINAL PAIN, ACUTE (ICD-789.00)    Dose: 1 gram    Route: po    Exp Date: 10/23/2010    Lot #: [id number]    Mfr: [email or web address]    Patient tolerated medication without complications    Given by: Mauricia Area CMA, (February 03, 2010 4:05 PM)  Orders Added: 1)  Urinalysis-[facility] [00000] 2)  Comp Met-[facility] [id number] 3)  CBC w/Diff-[facility] [85025] 4)  Lipase-[facility] [[id number]] 5)  Wet Prep- [facility] [87210] 6)  GC/Chlamydia-[facility] [87591/87491] 7)  U Preg-[facility] [81025] 8)  Zofran 4mg  Tab [EMRORAL] 9)  Azithromycin oral [Q0144] 10)  [place]- Est  Level 4 [id number]  Laboratory Results   Urine Tests  Date/Time Received: February 03, 2010 2:36 PM  Date/Time Reported: February 03, 2010 3:37 PM   Routine Urinalysis   Color: yellow Appearance: Clear Glucose: negative   (Normal Range: Negative) Bilirubin: negative   (Normal Range: Negative) Ketone: negative   (Normal Range: Negative) Spec. Gravity: 1.020   (Normal Range: 1.003-1.035) Blood: moderate    (Normal Range: Negative) pH: 5.5   (Normal Range: 5.0-8.0) Protein: >=300   (Normal Range: Negative) Urobilinogen: 0.2   (Normal Range: 0-1) Nitrite: negative   (Normal Range: Negative) Leukocyte Esterace: small   (Normal Range: Negative)  Urine Microscopic WBC/HPF: 10-15 RBC/HPF: 0-3 Bacteria/HPF: 2+ Epithelial/HPF: 1-5 Other: occ trich    Urine HCG: negative Comments: ...........test performed by...........Marland KitchenHedy Camara, CMA  Date/Time Received: February 03, 2010 2:36  PM  Date/Time Reported: February 03, 2010 3:26 PM   [employer] Source: vaginal WBC/hpf: >20 Bacteria/hpf: 3+  Cocci Clue cells/hpf: none  Positive whiff Yeast/hpf: none Trichomonas/hpf: many Comments: 20+ RBC's present...........test performed by...........Marland KitchenHedy Camara, CMA

## 2010-09-23 NOTE — Assessment & Plan Note (Signed)
Summary: DNKA   DPG   

## 2010-09-23 NOTE — Assessment & Plan Note (Signed)
Summary: uti follow up per dr. t/tlb   Vital Signs:  Patient profile:   35 year old female Height:      67 inches Weight:      115 pounds BMI:     18.08 Temp:     98.2 degrees F oral Pulse rate:   87 / minute BP sitting:   119 / 73  (left arm) Cuff size:   regular  Vitals Entered By: Enid Skeens, CMA (June 03, 2010 10:50 AM) CC: uti sxs Is Patient Diabetic? No   Primary Care Provider:  Dorthey Sawyer MD  CC:  uti sxs.  History of Present Illness: 35 yo female with Stage V CKD on HD here with dysuria,vag DC.    Present for the last 3 weeks.  Stable.  Burning with urination.  Also with minimal vaginal dischage, not malodorous.  No fevers/chills, no N/V/D/C, no abd pain.  Some low back pain across entire low back but does not lateralize.  No radiation.  No change in color/consistency of urine however she only voids 1-2 times a day with her ESRD and had already voided today.  Pt has had BV and yeast infections before and doesn't think its that.  She has also had a hx gonococcal infection, she was treated with azithromycin in the office and keflex however did not get ceftriaxone.  She had a test of cure that was negative.  She is back because she could not void in the office yesterday.  She also cannot void today.  Symtpoms unchanged.  Current Medications (verified): 1)  Ranitidine Hcl 150 Mg Tabs (Ranitidine Hcl) .... One Daily For 2 Weeks Then One Daily As Needed For Stomach Pain and Pain Wtih Swallow 2)  Hydrocodone-Acetaminophen 5-325 Mg Tabs (Hydrocodone-Acetaminophen) .Marland Kitchen.. 1 By Mouth Up To 4 Times Per Day As Needed For Pain 3)  Zofran 4 Mg Tabs (Ondansetron Hcl) .Marland Kitchen.. 1 By Mouth Every 8 Hours As Needed For Nausea 4)  Doxycycline Hyclate 100 Mg Caps (Doxycycline Hyclate) .... One By Mouth Two Times A Day X14d 5)  Metronidazole 500 Mg Tabs (Metronidazole) .... One Tab By Mouth Two Times A Day X 14 Days, Take After Hd On Dialysis Days 6)  Fluconazole 150 Mg Tabs (Fluconazole) ....  One Tab By Mouth X1 If You Develop A Yeast Infection  Allergies (verified): No Known Drug Allergies  Review of Systems       See HPI  Physical Exam  General:  Well-developed,well-nourished,in no acute distress; alert,appropriate and cooperative throughout examination Abdomen:  Bowel sounds positive,abdomen soft and non-tender without masses, organomegaly or hernias noted. Genitalia:  Normal introitus for age, no external lesions, translucent vaginal discharge, mucosa pink and moist, no vaginal lesions, no vaginal atrophy, no friaility or hemorrhage, CERVIX WITH MUCOPURULENT DISCHARGE.  CERVICAL MOTION TENDERNESS PRESENT. normal uterus size and position, no adnexal masses or tenderness.  Wet prep and GC/Chlam collected.   Impression & Recommendations:  Problem # 1:  PELVIC INFLAMMATORY DISEASE (ICD-614.9) Assessment New Azithro 1g in office. Rocephin 250mg  IM in office. Doxy 100 two times a day x14d Flagyl 500 two times a day x 14d Handout given. RTC 6 weeks for test of cure. Pt will alert partners.  Orders: [facility]- Est  Level 4 (99214) Rocephin  250mg  [id number]) Azithromycin oral [id number])  Problem # 2:  UNSPECIFIED VAGINITIS AND VULVOVAGINITIS (ICD-616.10) Assessment: Unchanged See #1.  Awaiting wet prep.  The following medications were removed from the medication list:    Flagyl 500 Mg Tabs (  Metronidazole) ..... One by mouth two times a day x 7 days Her updated medication list for this problem includes:    Doxycycline Hyclate 100 Mg Caps (Doxycycline hyclate) ..... One by mouth two times a day x14d    Metronidazole 500 Mg Tabs (Metronidazole) ..... One tab by mouth two times a day x 14 days, take after hd on dialysis days  Orders: GC/Chlamydia-[facility] (87591/87491) Wet Prep- [place] [id number]) [place]- Est  Level 4 [id number])  Problem # 3:  DYSURIA (ICD-788.1) Assessment: Unchanged Awaiting wet prep. Pt unable to void today.  See #1.  The following medications were removed from the  medication list:    Flagyl 500 Mg Tabs (Metronidazole) ..... One by mouth two times a day x 7 days Her updated medication list for this problem includes:    Doxycycline Hyclate 100 Mg Caps (Doxycycline hyclate) ..... One by mouth two times a day x14d    Metronidazole 500 Mg Tabs (Metronidazole) ..... One tab by mouth two times a day x 14 days, take after hd on dialysis days  Orders: [facility]- Est  Level 4 [id number])  The following medications were removed from the medication list:    Flagyl 500 Mg Tabs (Metronidazole) ..... One by mouth two times a day x 7 days Her updated medication list for this problem includes:    Doxycycline Hyclate 100 Mg Caps (Doxycycline hyclate) ..... One by mouth two times a day x14d    Metronidazole 500 Mg Tabs (Metronidazole) ..... One tab by mouth two times a day x 14 days, take after hd on dialysis days  Complete Medication List: 1)  Ranitidine Hcl 150 Mg Tabs (Ranitidine hcl) .... One daily for 2 weeks then one daily as needed for stomach pain and pain wtih swallow 2)  Hydrocodone-acetaminophen 5-325 Mg Tabs (Hydrocodone-acetaminophen) .Marland Kitchen.. 1 by mouth up to 4 times per day as needed for pain 3)  Zofran 4 Mg Tabs (Ondansetron hcl) .Marland Kitchen.. 1 by mouth every 8 hours as needed for nausea 4)  Doxycycline Hyclate 100 Mg Caps (Doxycycline hyclate) .... One by mouth two times a day x14d 5)  Metronidazole 500 Mg Tabs (Metronidazole) .... One tab by mouth two times a day x 14 days, take after hd on dialysis days 6)  Fluconazole 150 Mg Tabs (Fluconazole) .... One tab by mouth x1 if you develop a yeast infection  Patient Instructions: 1)  You have PID. 2)  Azithromycin and Rocephin in office. 3)  Doxycycline and Metronidazole two times a day for 14 days. 4)  Come back in 6 weeks for a test of cure. 5)  -Dr. Darene Lamer. Prescriptions: FLUCONAZOLE 150 MG TABS (FLUCONAZOLE) One tab by mouth x1 if you develop a yeast infection  #1 x 0   Entered and Authorized by:   Aundria Mems MD    Signed by:   Aundria Mems MD on 06/03/2010   Method used:   Electronically to        [facility]  [address] [phone] (retail)       [address]       [place], [place]  [address]       Ph: [phone] or [phone]       Fax: [phone]   RxID:   [id number] METRONIDAZOLE 500 MG TABS (METRONIDAZOLE) One tab by mouth two times a day x 14 days, take after HD on dialysis days  #28 x 0   Entered and Authorized by:   Marcello Moores  Gillie Fleites MD   Signed by:   Aundria Mems MD on 06/03/2010   Method used:   Print then Give to Patient   RxID:   [id number] DOXYCYCLINE HYCLATE 100 MG CAPS (DOXYCYCLINE HYCLATE) One by mouth two times a day x14d  #28 x 0   Entered and Authorized by:   Aundria Mems MD   Signed by:   Aundria Mems MD on 06/03/2010   Method used:   Print then Give to Patient   RxID:   [id number]    Medication Administration  Injection # 1:    Medication: Rocephin  250mg     Diagnosis: PELVIC INFLAMMATORY DISEASE (ICD-614.9)    Route: IM    Site: RUOQ gluteus    Exp Date: 12/22/2012    Lot #: [id number]    Mfr: [email or web address]    Patient tolerated injection without complications    Given by: Enid Skeens, [place] (June 03, 2010 11:36 AM)  Medication # 1:    Medication: Azithromycin oral    Diagnosis: PELVIC INFLAMMATORY DISEASE (ICD-614.9)    Dose: 1g    Route: po    Exp Date: 11/23/2011    Lot #: [id number]    Mfr: [email or web address]    Patient tolerated medication without complications    Given by: Enid Skeens, [place] (June 03, 2010 11:37 AM)  Orders Added: 1)  GC/Chlamydia-[facility] [87591/87491] 2)  Lenard Forth Prep- [facility] [87210] 3)  [facility]- Est  Level 4 [id number] 4)  Rocephin  250mg  [J0696] 5)  Azithromycin oral [id number]   Appended Document: wet prep report     Lab Visit  Laboratory Results  Date/Time Received: June 03, 2010 11:11 AM  Date/Time Reported: June 03, 2010 12:34 PM   Wet [place] Source: vag WBC/hpf: >20 Bacteria/hpf:  2+  [place] Clue cells/hpf: none  Negative whiff Yeast/hpf: none Trichomonas/hpf: none Comments: ...............test performed by......Marland KitchenBonnie A. Martinique, MLS (ASCP)cm   Orders Today:

## 2010-09-25 NOTE — Progress Notes (Signed)
   Phone Note Call from Patient   Caller: [facility] Call For: [phone] Summary of Call: Gave NPI# to [facility] for patient's cardiology appt.  One time authorization given only. Initial call taken by: Eusebio Friendly,  August 07, 2010 2:59 PM

## 2010-10-16 ENCOUNTER — Encounter: Payer: Self-pay | Admitting: Sports Medicine

## 2010-10-16 ENCOUNTER — Ambulatory Visit (INDEPENDENT_AMBULATORY_CARE_PROVIDER_SITE_OTHER): Payer: Medicaid Other | Admitting: Sports Medicine

## 2010-10-16 DIAGNOSIS — IMO0002 Reserved for concepts with insufficient information to code with codable children: Secondary | ICD-10-CM

## 2010-10-16 DIAGNOSIS — R300 Dysuria: Secondary | ICD-10-CM | POA: Insufficient documentation

## 2010-10-16 DIAGNOSIS — Z992 Dependence on renal dialysis: Secondary | ICD-10-CM

## 2010-10-16 DIAGNOSIS — N186 End stage renal disease: Secondary | ICD-10-CM

## 2010-10-16 DIAGNOSIS — N760 Acute vaginitis: Secondary | ICD-10-CM

## 2010-10-16 LAB — POCT URINALYSIS DIPSTICK
Bilirubin, UA: NEGATIVE
Glucose, UA: NEGATIVE
Ketones, UA: NEGATIVE
Nitrite, UA: NEGATIVE
Protein, UA: >=300
Spec Grav, UA: 1.02
Urobilinogen, UA: 0.2
pH, UA: 7

## 2010-10-16 LAB — POCT UA - MICROSCOPIC ONLY: WBC, Ur, HPF, POC: >20

## 2010-10-16 LAB — POCT WET PREP (WET MOUNT)
Trichomonas Wet Prep HPF POC: NEGATIVE
WBC, Wet Prep HPF POC: >20
Yeast Wet Prep HPF POC: NEGATIVE

## 2010-10-16 MED ORDER — CEPHALEXIN 500 MG PO CAPS: 500.0000 mg | ORAL_CAPSULE | Freq: Two times a day (BID) | ORAL | Status: AC

## 2010-10-16 MED ORDER — FLUCONAZOLE 150 MG PO TABS: 150.0000 mg | ORAL_TABLET | Freq: Once | ORAL | Status: DC

## 2010-10-16 MED ORDER — METRONIDAZOLE 500 MG PO TABS: ORAL_TABLET | ORAL | Status: AC

## 2010-10-16 NOTE — Assessment & Plan Note (Signed)
This was likely related to her GC/Chlam Neg PID. Resolved.

## 2010-10-16 NOTE — Assessment & Plan Note (Signed)
Cystitis present based on UA. Will tx with keflex. Diflucan for probably yeast infection.

## 2010-10-16 NOTE — Patient Instructions (Signed)
Bacterial vaginosis and UTI.  Flagyl 2,000 mg x1 (4 pills) for BV. Keflex 500mg  2x a day for 7 days for UTI. Diflucan in case you get a UTI. Come back to see me as needed, great to see you!  -Dr. Darene Lamer.

## 2010-10-16 NOTE — Progress Notes (Signed)
  Subjective:    Patient ID: Candace Griffin, female    DOB: [birth year], 35 y.o.   MRN: [id number]  HPI VAGINAL DISCHARGE Onset: Few days Description: Itching, discharge, clear.  Symptoms Odor: Minimal Itching: YES Vaginal burning: YES Dysuria: Minimal Bleeding: NO Pelvic pain: NO Back pain: Minimal Fever: NO Genital sores: NO Rash: NO Dyspareunia: NO GI Symptoms: NO  Red Flags:  Missed period: NO Recent antibiotics: Finished treatment for PID recently, all abdominal cramping resolved. Possible STD exposure: NO IUD: NO Diabetes: NO   Review of Systems    See HPI Objective:   Physical Exam  Constitutional: She appears well-developed and well-nourished. No distress.  Abdominal: Soft. Bowel sounds are normal. She exhibits no distension and no mass. There is no tenderness. There is no rebound and no guarding.  Genitourinary: There is no rash, tenderness, lesion or injury on the right labia. There is no rash, tenderness, lesion or injury on the left labia. Uterus is not deviated, not enlarged, not fixed and not tender. Cervix exhibits no motion tenderness, no discharge and no friability. Right adnexum displays no mass, no tenderness and no fullness. Left adnexum displays no mass, no tenderness and no fullness. No erythema, tenderness or bleeding around the vagina. No foreign body around the vagina. No signs of injury around the vagina. Vaginal discharge found.       Clear discharge present.  Skin: She is not diaphoretic.          Assessment & Plan:

## 2010-10-16 NOTE — Assessment & Plan Note (Signed)
WP suggestive of BV. Will tx with flagyl 2g PO x1.

## 2010-10-18 LAB — URINE CULTURE
Colony Count: NO GROWTH
Organism ID, Bacteria: NO GROWTH

## 2010-11-12 LAB — WET PREP, GENITAL
Trich, Wet Prep: NONE SEEN
Yeast Wet Prep HPF POC: NONE SEEN

## 2010-11-12 LAB — URINALYSIS, ROUTINE W REFLEX MICROSCOPIC
Bilirubin Urine: NEGATIVE
Glucose, UA: NEGATIVE mg/dL
Ketones, ur: NEGATIVE mg/dL
Nitrite: NEGATIVE
Protein, ur: 100 mg/dL — AB
Specific Gravity, Urine: 1.02 (ref 1.005–1.030)
Urobilinogen, UA: 0.2 mg/dL (ref 0.0–1.0)
pH: 6 (ref 5.0–8.0)

## 2010-11-12 LAB — URINE CULTURE: Colony Count: 2000

## 2010-11-12 LAB — URINE MICROSCOPIC-ADD ON

## 2010-11-12 LAB — POCT PREGNANCY, URINE: Preg Test, Ur: NEGATIVE

## 2010-11-25 LAB — IRON AND TIBC
Iron: 66 ug/dL (ref 42–135)
Saturation Ratios: 27 % (ref 20–55)
TIBC: 245 ug/dL — ABNORMAL LOW (ref 250–470)
UIBC: 179 ug/dL

## 2010-11-25 LAB — FERRITIN: Ferritin: 68 ng/mL (ref 10–291)

## 2010-11-25 LAB — POCT HEMOGLOBIN-HEMACUE: Hemoglobin: 8.7 g/dL — ABNORMAL LOW (ref 12.0–15.0)

## 2010-11-27 LAB — POCT HEMOGLOBIN-HEMACUE
Hemoglobin: 9.6 g/dL — ABNORMAL LOW (ref 12.0–15.0)
Hemoglobin: 9.6 g/dL — ABNORMAL LOW (ref 12.0–15.0)

## 2010-11-27 LAB — IRON AND TIBC
Iron: 92 ug/dL (ref 42–135)
Saturation Ratios: 34 % (ref 20–55)
TIBC: 269 ug/dL (ref 250–470)
UIBC: 177 ug/dL

## 2010-11-27 LAB — FERRITIN: Ferritin: 37 ng/mL (ref 10–291)

## 2010-11-28 LAB — CBC
HCT: 29.4 % — ABNORMAL LOW (ref 36.0–46.0)
HCT: 30 % — ABNORMAL LOW (ref 36.0–46.0)
Hemoglobin: 10 g/dL — ABNORMAL LOW (ref 12.0–15.0)
Hemoglobin: 10.1 g/dL — ABNORMAL LOW (ref 12.0–15.0)
MCHC: 33.9 g/dL (ref 30.0–36.0)
MCHC: 33.5 g/dL (ref 30.0–36.0)
MCV: 89.4 fL (ref 78.0–100.0)
MCV: 89.1 fL (ref 78.0–100.0)
Platelets: 171 10*3/uL (ref 150–400)
Platelets: 135 10*3/uL — ABNORMAL LOW (ref 150–400)
RBC: 3.29 MIL/uL — ABNORMAL LOW (ref 3.87–5.11)
RBC: 3.37 MIL/uL — ABNORMAL LOW (ref 3.87–5.11)
RDW: 14.2 % (ref 11.5–15.5)
RDW: 14.5 % (ref 11.5–15.5)
WBC: 4.3 10*3/uL (ref 4.0–10.5)
WBC: 4.5 10*3/uL (ref 4.0–10.5)

## 2010-11-28 LAB — RENAL FUNCTION PANEL
Albumin: 4.4 g/dL (ref 3.5–5.2)
Albumin: 4.8 g/dL (ref 3.5–5.2)
BUN: 72 mg/dL — ABNORMAL HIGH (ref 6–23)
BUN: 86 mg/dL — ABNORMAL HIGH (ref 6–23)
CO2: 20 mEq/L (ref 19–32)
CO2: 21 mEq/L (ref 19–32)
Calcium: 8.6 mg/dL (ref 8.4–10.5)
Calcium: 9 mg/dL (ref 8.4–10.5)
Chloride: 110 mEq/L (ref 96–112)
Chloride: 106 mEq/L (ref 96–112)
Creatinine, Ser: 8.88 mg/dL — ABNORMAL HIGH (ref 0.4–1.2)
Creatinine, Ser: 8.83 mg/dL — ABNORMAL HIGH (ref 0.4–1.2)
GFR calc Af Amer: 6 mL/min — ABNORMAL LOW (ref >60)
GFR calc Af Amer: 6 mL/min — ABNORMAL LOW (ref >60)
GFR calc non Af Amer: 5 mL/min — ABNORMAL LOW (ref >60)
GFR calc non Af Amer: 5 mL/min — ABNORMAL LOW (ref >60)
Glucose, Bld: 86 mg/dL (ref 70–99)
Glucose, Bld: 113 mg/dL — ABNORMAL HIGH (ref 70–99)
Phosphorus: 5.2 mg/dL — ABNORMAL HIGH (ref 2.3–4.6)
Phosphorus: 4.1 mg/dL (ref 2.3–4.6)
Potassium: 3.9 mEq/L (ref 3.5–5.1)
Potassium: 4.2 mEq/L (ref 3.5–5.1)
Sodium: 142 mEq/L (ref 135–145)
Sodium: 138 mEq/L (ref 135–145)

## 2010-11-28 LAB — IRON AND TIBC
Iron: 104 ug/dL (ref 42–135)
Saturation Ratios: 37 % (ref 20–55)
TIBC: 282 ug/dL (ref 250–470)
UIBC: 178 ug/dL

## 2010-11-28 LAB — FERRITIN: Ferritin: 101 ng/mL (ref 10–291)

## 2010-11-29 LAB — POCT HEMOGLOBIN-HEMACUE: Hemoglobin: 8.4 g/dL — ABNORMAL LOW (ref 12.0–15.0)

## 2010-11-30 LAB — RENAL FUNCTION PANEL
Albumin: 4.2 g/dL (ref 3.5–5.2)
BUN: 87 mg/dL — ABNORMAL HIGH (ref 6–23)
CO2: 18 mEq/L — ABNORMAL LOW (ref 19–32)
Calcium: 6.5 mg/dL — ABNORMAL LOW (ref 8.4–10.5)
Chloride: 109 mEq/L (ref 96–112)
Creatinine, Ser: 9.75 mg/dL — ABNORMAL HIGH (ref 0.4–1.2)
GFR calc Af Amer: 6 mL/min — ABNORMAL LOW (ref >60)
GFR calc non Af Amer: 5 mL/min — ABNORMAL LOW (ref >60)
Glucose, Bld: 73 mg/dL (ref 70–99)
Phosphorus: 7.7 mg/dL — ABNORMAL HIGH (ref 2.3–4.6)
Potassium: 3.4 mEq/L — ABNORMAL LOW (ref 3.5–5.1)
Sodium: 140 mEq/L (ref 135–145)

## 2010-11-30 LAB — IRON AND TIBC
Iron: 61 ug/dL (ref 42–135)
Saturation Ratios: 24 % (ref 20–55)
TIBC: 258 ug/dL (ref 250–470)
UIBC: 197 ug/dL

## 2010-11-30 LAB — CBC
HCT: 27.1 % — ABNORMAL LOW (ref 36.0–46.0)
Hemoglobin: 9.2 g/dL — ABNORMAL LOW (ref 12.0–15.0)
MCHC: 33.9 g/dL (ref 30.0–36.0)
MCV: 87.5 fL (ref 78.0–100.0)
Platelets: 152 10*3/uL (ref 150–400)
RBC: 3.1 MIL/uL — ABNORMAL LOW (ref 3.87–5.11)
RDW: 15.5 % (ref 11.5–15.5)
WBC: 3.3 10*3/uL — ABNORMAL LOW (ref 4.0–10.5)

## 2010-11-30 LAB — FERRITIN: Ferritin: 67 ng/mL (ref 10–291)

## 2010-12-01 LAB — POCT HEMOGLOBIN-HEMACUE
Hemoglobin: 9.3 g/dL — ABNORMAL LOW (ref 12.0–15.0)
Hemoglobin: 8.6 g/dL — ABNORMAL LOW (ref 12.0–15.0)

## 2010-12-01 LAB — IRON AND TIBC
Iron: 47 ug/dL (ref 42–135)
Saturation Ratios: 16 % — ABNORMAL LOW (ref 20–55)
TIBC: 298 ug/dL (ref 250–470)
UIBC: 251 ug/dL

## 2010-12-01 LAB — FERRITIN: Ferritin: 79 ng/mL (ref 10–291)

## 2010-12-02 LAB — RENAL FUNCTION PANEL
Albumin: 4.1 g/dL (ref 3.5–5.2)
BUN: 83 mg/dL — ABNORMAL HIGH (ref 6–23)
CO2: 20 mEq/L (ref 19–32)
Calcium: 7.9 mg/dL — ABNORMAL LOW (ref 8.4–10.5)
Chloride: 110 mEq/L (ref 96–112)
Creatinine, Ser: 10.6 mg/dL — ABNORMAL HIGH (ref 0.4–1.2)
GFR calc Af Amer: 5 mL/min — ABNORMAL LOW (ref >60)
GFR calc non Af Amer: 4 mL/min — ABNORMAL LOW (ref >60)
Glucose, Bld: 79 mg/dL (ref 70–99)
Phosphorus: 5.8 mg/dL — ABNORMAL HIGH (ref 2.3–4.6)
Potassium: 4.6 mEq/L (ref 3.5–5.1)
Sodium: 139 mEq/L (ref 135–145)

## 2010-12-02 LAB — IRON AND TIBC
Iron: 30 ug/dL — ABNORMAL LOW (ref 42–135)
Saturation Ratios: 11 % — ABNORMAL LOW (ref 20–55)
TIBC: 283 ug/dL (ref 250–470)
UIBC: 253 ug/dL

## 2010-12-02 LAB — POCT HEMOGLOBIN-HEMACUE
Hemoglobin: 8.6 g/dL — ABNORMAL LOW (ref 12.0–15.0)
Hemoglobin: 8.5 g/dL — ABNORMAL LOW (ref 12.0–15.0)

## 2010-12-02 LAB — FERRITIN: Ferritin: 9 ng/mL — ABNORMAL LOW (ref 10–291)

## 2010-12-03 LAB — POCT HEMOGLOBIN-HEMACUE: Hemoglobin: 7.4 g/dL — CL (ref 12.0–15.0)

## 2010-12-09 LAB — POCT I-STAT 4, (NA,K, GLUC, HGB,HCT)
Glucose, Bld: 84 mg/dL (ref 70–99)
HCT: 28 % — ABNORMAL LOW (ref 36.0–46.0)
Hemoglobin: 9.5 g/dL — ABNORMAL LOW (ref 12.0–15.0)
Potassium: 4 mEq/L (ref 3.5–5.1)
Sodium: 134 mEq/L — ABNORMAL LOW (ref 135–145)

## 2011-01-06 NOTE — Procedures (Signed)
CEPHALIC VEIN MAPPING   INDICATION:  Preop for dialysis access.   HISTORY:  End-stage renal disease.   EXAM:   The right cephalic vein is compressible.   Diameter measurements range from 0.43 to 0.19 cm.   The left cephalic vein is compressible.   Diameter measurements range from 0.67 to 0.27 cm.   See attached worksheet for all measurements.   IMPRESSION:  Patent bilateral cephalic veins which are of acceptable  diameter for use as a dialysis access site.   ___________________________________________  Nelda Severe. Kellie Simmering, M.D.   AC/MEDQ  D:  09/11/2008  T:  09/11/2008  Job:  [id number]

## 2011-01-06 NOTE — Discharge Summary (Signed)
Candace Griffin, Candace Griffin           ACCOUNT NO.:  [id number]   MEDICAL RECORD NO.:  [id number]          PATIENT TYPE:  INP   LOCATION:  9305                          FACILITY:  [facility]   PHYSICIAN:  Florian Buff, M.D.   DATE OF BIRTH:  02/04/1976   DATE OF ADMISSION:  12/07/2007  DATE OF DISCHARGE:  01/14/2008                               DISCHARGE SUMMARY   DISCHARGE DIAGNOSES:  1. Status post normal spontaneous vaginal delivery with unremarkable      postpartum course.  2. Chronic glomerulonephropathy.  3. Hypertension.  4. Chronic renal insufficiency.   PROCEDURES:  1. Daily non-stress test monitoring.  2. Daily Doppler flow monitoring.  3. Vaginal delivery on 01/12/2008.  4. Routine postpartum care.   Please refer to the initial history and physical for details of  admission to the hospital as well as the prenatal chart.   HOSPITAL COURSE:  The patient was admitted on 12/07/2007 after having  being evaluated at Maternal Fetal Medicine with Doppler flow studies,  which showed essentially intermittent end-diastolic flow.  She of course  is well known to the service because of her chronic  glomerulonephropathy.  She runs baseline creatinines under 3.5 range as  well as hypertension.  The chronic renal insufficiency is due to chronic  glomerulonephropathy, and she was on labetalol 300 mg twice a day for  her hypertension.  She has a baseline proteinuria typically in the 1000-  2000 mg range, and occasionally it has been higher.   As a result, the patient was admitted at 22 weeks' even for inpatient  monitoring and care.  We will plan at that time because of her  intermittent end-diastolic flow, was for the patient to undergo daily  Doppler flow studies as well as daily NSTs and quite frankly her course  remained pretty unremarkable.  She had, depending on the day, some  variability in her Doppler flow studies, but as far as I can glean from  the chart, there was never a time  at which she actually had a reverse  flow.   She had ultrasounds, which tracked growth.  Of course, she had  significant growth restriction at a very early gestation, and that less  than 15th percentile growth of course continued, but in a stable  fashion.  The fluid volume around the baby on ultrasounds remained  adequate, and appropriate.  Her blood pressure was well maintained in  the labetalol 300 mg twice daily.  She had creatinine 3 times a week,  which all again remained in a relatively narrow range in the 3s.  The  patient really reported no significant complaints during the  hospitalization.   Of course, she did receive betamethasone in anticipation of early  delivery and that was done at the time of her admission.  She was  followed throughout by Nephrology Service, and she was well known to  them as well and really no significant change in her care occurred  throughout the entire time of the hospitalization until 01/07/2008 when  she began to have intermittent variable decelerations, not really  related to uterine activity and  then she underwent continuous external  fetal monitoring throughout.   The last ultrasound that was done prior to her induction of labor had an  estimated fetal weight of 1348 g with an adequate fluid volume.  The  plan with Maternal Fetal Medicine from the outset was to intervene if  the baby appeared to be decompensating or if there were significant  Doppler flow abnormalities or at 34 weeks if she were to get that far  and she did indeed get that far.  It was discussed with her extensively  regarding cesarean section versus delivery vaginally, and the patient  knew that it may not be successful, wanted to at least try for a vaginal  delivery as a result on the evening of 01/11/2008.   The patient was evaluated and found to be spontaneously 3 cm, 80%, -2  and as a result underwent Pitocin induction of labor with the favorable  cervix.  She had  repetitive variables initially, and as a result I  recommended an amniotomy to be performed and an amnioinfusion be  performed as well.  This essentially stopped the variables with any  irregularity.  She had occasionally ones with good return, and she  progressed quickly through labor and as she delivered precipitously  while in the bathroom with the nurse in attendance, a viable female  infant at 83 with Apgars of 7 and 8, weighing 2 pounds and 13 ounces.   There was a three-vessel cord.  There was a nuchal-cord present, and  neonatologist was present shortly after birth.  The patient had no  vaginal or perineal lacerations.  The cord blood was sent.  The infant  did well initially, and has continued to be maintained on room air  essentially feeding, growing, and doing well.  Postpartum, [name]'s lab  work again is stable with a stable creatinine.  Her blood pressures also  had been stable, and Dr. Maryclare Labrador, nephrologist added enalapril to her  labetalol and she has an appointment to see him on 02/06/2008 at 4:30  p.m.   Her hemoglobin and hematocrit are 9 and 26 postpartum.  Creatinine again  is 3.4.  She is without complaints.  Her physical exam is normal and as  a result she is discharged to home on the morning of postpartum day #2  in good and stable condition, and [facility]up with [facility] 4  weeks postpartum.  Her husband has already had a vasectomy, and to see  Dr. Maryclare Labrador on 02/06/2008 at 4:30.  She is discharged to home on labetalol  300 b.i.d. and enalapril 2.5 b.i.d.      Florian Buff, M.D.  Electronically Signed     LHE/MEDQ  D:  01/14/2008  T:  01/14/2008  Job:  [id number]

## 2011-01-06 NOTE — Consult Note (Signed)
NEW PATIENT CONSULTATION   Candace Griffin, Candace Griffin  DOB:  Dec 12, 1975                                       09/11/2008  [id number]   The patient is a 35 year old female referred by Dr. Edrick Oh for  vascular access.  This patient has a history of glomerulonephritis since  age 67 and has been followed by the nephrologist with progressive renal  insufficiency.  After her third pregnancy the kidneys began to  deteriorate more and she is now being evaluated for vascular access.   PAST MEDICAL HISTORY:  She has a past medical history of hypertension  and the chronic glomerulonephritis but otherwise is healthy with no  evidence of coronary artery disease, stroke, hyperlipidemia, etc.   PHYSICAL EXAMINATION:  Vital signs:  Blood pressure 141/94, heart rate  68, respirations 14, temperature 98.4.  General:  She is a healthy-  appearing female in no apparent distress, alert and oriented x3.  Neck:  Supple and 3+ carotid pulses are palpable.  Upper extremity exam:  Reveals 3+ brachial and radial pulses bilaterally.  Cephalic veins after  applying a tourniquet appear adequate for attempt at fistula creation  with the right cephalic extending down lower than the left.  (The  patient is left handed).  This was confirmed by vein mapping and I think  the best plan initially would be to attempt a Cimino fistula in the  right wrist.  I have discussed the pros and cons with her and the  potential problems and she was scheduled for Tuesday, February 2, at  [facility] by Dr. Oneida Alar.  The patient has requested Tuesday because  of the child care situation.  Hopefully this will provide a satisfactory  site for vascular access for this nice lady.   Nelda Severe Kellie Simmering, M.D.  Electronically Signed   JDL/MEDQ  D:  09/11/2008  T:  09/12/2008  Job:  [id number]   cc:   Sherril Croon, M.D.  Dixon Boos, M.D.

## 2011-01-06 NOTE — Op Note (Signed)
NAMEJENEVIE, Candace Griffin           ACCOUNT NO.:  [id number]   MEDICAL RECORD NO.:  [id number]          PATIENT TYPE:  AMB   LOCATION:  SDS                          FACILITY:  [facility]   PHYSICIAN:  Jessy Oto. Fields, MD  DATE OF BIRTH:  11-16-1975   DATE OF PROCEDURE:  09/25/2008  DATE OF DISCHARGE:  09/25/2008                               OPERATIVE REPORT   PROCEDURE:  Right radiocephalic arteriovenous fistula.   PREOPERATIVE DIAGNOSIS:  End-stage renal disease.   POSTOPERATIVE DIAGNOSIS:  End-stage renal disease.   ANESTHESIA:  Local with IV sedation.   ASSISTANT:  Jacinta Shoe, PA-C   OPERATIVE FINDINGS:  1. 1-mm right radial artery.  2. 3-mm cephalic vein.   OPERATIVE DETAILS:  After obtaining informed consent, the patient was  taken to the operating room.  The patient was placed in supine position  on the operating table.  After adequate sedation, the patient's entire  right upper extremity was prepped and draped in usual sterile fashion.  Local anesthesia was infiltrated midway between the radial artery and  cephalic vein.  Incision was carried down through subcutaneous tissues  down to the level of cephalic vein.  Cephalic vein was dissected free  circumferentially.  There was some spasm within this.  Next, the radial  artery was dissected free in the medial portion of incision.  The radial  artery was quite small, also spasmodic approximately 1 mm in diameter.  The patient was given 5000 units of intravenous heparin.  The distal  cephalic vein was ligated with a 2-0 silk tie and the vein transected.  The vein was gently distended with heparinized saline and marked for  orientation.  I was able to pass up to a 4 dilator through the cephalic  vein.  The radial artery was controlled proximally and distally with  fine bulldog clamps.  A longitudinal opening was made in the radial  artery and the cephalic vein was sewn end of vein to side of artery  using a running 7-0  Prolene suture.  Just prior to completion  anastomosis, it was fore bled and back bled thoroughly flushed.  The  fistula filled immediately.  Anastomosis was secured.  Clamps were  released.  There was Doppler flow in the cephalic vein all the way up to  the midforearm at completion of anastomosis.  The patient was given 50  mg of protamine to assist in hemostasis.  Subcutaneous tissues were  reapproximated using running 3-0 Vicryl suture.  Skin was closed with 4-  0 Vicryl subcuticular stitch.  The patient tolerated the procedure well  and there were no complications.  Instrument, sponge, and needle counts  were correct at the end of the case.  The patient was taken to recovery  room in stable condition.      Jessy Oto. Fields, MD  Electronically Signed    CEF/MEDQ  D:  09/25/2008  T:  09/25/2008  Job:  [id number]

## 2011-01-06 NOTE — Assessment & Plan Note (Signed)
OFFICE VISIT   Candace Griffin, STAUS  DOB:  08/24/1976                                       10/31/2008  [id number]   The patient returns for follow-up today.  She had a right radiocephalic  AV fistula placed on February 2nd.  On exam today, the incision is well-  healed.  There is an easily palpable thrill in the fistula.  It seems to  be maturing nicely.  She currently is not on dialysis.  Her blood  pressure today was 130/91, heart rate 97.   Overall, the fistula seems to be maturing well.  I counseled her today  on exercising the fistula to try to build this up over time.  She has  follow-up scheduled with Dr. Justin Mend in the near future.  She will follow  up with me on an as-needed basis, as far as her access is concerned.   Jessy Oto. Fields, MD  Electronically Signed   CEF/MEDQ  D:  10/31/2008  T:  11/01/2008  Job:  [id number]   cc:   Sherril Croon, M.D.

## 2011-01-09 NOTE — Discharge Summary (Signed)
Candace Griffin, Candace Griffin           ACCOUNT NO.:  [id number]   MEDICAL RECORD NO.:  [id number]          PATIENT TYPE:  INP   LOCATION:  [id number]                          FACILITY:  [facility]   PHYSICIAN:  Vickki Hearing, M.D.     DATE OF BIRTH:  Oct 05, 1975   DATE OF ADMISSION:  06/26/2004  DATE OF DISCHARGE:  06/29/2004                                 DISCHARGE SUMMARY   PRIMARY MEDICAL PHYSICIAN:  Vickki Hearing, MD, [facility].   DISCHARGE DIAGNOSES:  1.  Term delivery viable female, status post induction of labor for worsening      renal function.  2.  History of glomerulonephritis.   DISCHARGE MEDICATIONS:  1.  Ibuprofen 600 mg p.o. q.6h. p.r.n.  2.  Ferrous sulfate 325 mg p.o. daily.  3.  Prenatal vitamin each day.  4.  Ortho Tri-Cyclen 1 tab p.o. daily beginning next Sunday.   BRIEF ADMISSION HISTORY:  Candace Griffin is a 35 year old, initially G5, P1-0-  3-1, who presented to our office at 35 and 1 for routine OB assessment.  She  was found to have 4+ proteinuria on her dipstick at that time.  She does  have a history of glomerulonephritis and has fluctuated between 1+ and 2+.  Her blood pressure was slightly elevated at 130s/70s.  This is not far from  her baseline.  Consequently, she was sent over to [facility] for an  ultrasound and [facility] panel.  This showed a creatinine of 1.8, which her  baseline had been around 1.6 a month earlier.  She had done a 24 hour urine  and returned that same day.  This showed a creatinine clearance of 53 and  1600 mg of protein.  She was consequently admitted for induction of labor  secondary to worsening renal function.   HOSPITAL COURSE:  #1 - PREGNANCY:  Candace Griffin was admitted with a history  as outlined above.  She was already dilated to 3 cm when induction was began  with Pitocin.  She progressed slowly initially until the morning of June 27, 2004.  Then, after assisted rupture of membranes, she progressed quite  quickly and  delivered a viable female at 1218.  Apgars were 9 at one minute  and 9 at five minutes.  There was nuchal cord x 1 which was reduced after  delivery.  She did have a first-degree laceration which was repaired with 3-  0 Vicryl.  There were no other significant complications.  Candace Griffin did  well postpartum and was discharged on day #2 for routine follow up.   #2 - HISTORY OF GLOMERULONEPHRITIS:  As mentioned, was induced secondary to  worsening renal function.  Postpartum, her blood pressure was monitored and  remained between 110-120s/70.  She had good urine output.  Her creatinine  was 1.7 on the day after delivery.  There were no further complications, and  she will be followed as an outpatient.  She will need follow up with her  nephrologist.   DIET:  Regular diet.   ACTIVITY:  She is to avoid sexual activity for 6 weeks.   WOUND  CARE:  Per booklet instructions.   FOLLOW UP CARE:  Return to family practice center in 32 weeks.      AK/MEDQ  D:  06/29/2004  T:  06/29/2004  Job:  [id number]

## 2011-01-30 ENCOUNTER — Ambulatory Visit: Payer: Medicaid Other | Admitting: Family Medicine

## 2011-02-05 ENCOUNTER — Ambulatory Visit (INDEPENDENT_AMBULATORY_CARE_PROVIDER_SITE_OTHER): Payer: Medicaid Other | Admitting: Sports Medicine

## 2011-02-05 ENCOUNTER — Encounter: Payer: Self-pay | Admitting: Sports Medicine

## 2011-02-05 ENCOUNTER — Other Ambulatory Visit (HOSPITAL_COMMUNITY)
Admission: RE | Admit: 2011-02-05 | Discharge: 2011-02-05 | Disposition: A | Discharge status: Home / Self Care | Payer: Medicaid Other | Source: Ambulatory Visit | Attending: Family Medicine | Admitting: Family Medicine

## 2011-02-05 DIAGNOSIS — N760 Acute vaginitis: Secondary | ICD-10-CM

## 2011-02-05 DIAGNOSIS — Z124 Encounter for screening for malignant neoplasm of cervix: Secondary | ICD-10-CM

## 2011-02-05 DIAGNOSIS — Z01419 Encounter for gynecological examination (general) (routine) without abnormal findings: Secondary | ICD-10-CM | POA: Insufficient documentation

## 2011-02-05 LAB — POCT WET PREP (WET MOUNT)
Clue Cells Wet Prep HPF POC: NEGATIVE
Trichomonas Wet Prep HPF POC: NEGATIVE
Yeast Wet Prep HPF POC: NEGATIVE

## 2011-02-05 MED ORDER — FLUCONAZOLE 150 MG PO TABS: 150.0000 mg | ORAL_TABLET | Freq: Once | ORAL | Status: DC

## 2011-02-05 MED ORDER — METRONIDAZOLE 500 MG PO TABS: 500.0000 mg | ORAL_TABLET | Freq: Two times a day (BID) | ORAL | Status: AC

## 2011-02-05 NOTE — Progress Notes (Signed)
  Subjective:    Patient ID: Candace Griffin, female    DOB: 29-Dec-1975, 35 y.o.   MRN: [id number]  HPI VAGINAL DISCHARGE Onset: Several days Description: Thick, itchy, whitish. Modifying factors: Gets frequently with HD.  Symptoms Odor: NO Itching: YES Vaginal burning: NO Dysuria: NO Bleeding: NO Pelvic pain: NO Back pain: NO Fever: NO Genital sores: NO Rash: NO Dyspareunia: NO GI Symptoms: NO  Red Flags:  Missed period: NO Recent antibiotics: NO Possible STD exposure: NO IUD: NO Diabetes: NO  Review of Systems    See hPI Objective:   Physical Exam  Constitutional: She appears well-developed and well-nourished. No distress.  Abdominal: Bowel sounds are normal. She exhibits no distension. There is no tenderness. There is no rebound and no guarding.  Genitourinary: Vagina normal and uterus normal. There is no rash, tenderness or lesion on the right labia. There is no rash, tenderness or lesion on the left labia. Cervix exhibits no motion tenderness, no discharge and no friability.       Thick, whitish, curdy discharge.  Skin: Skin is warm and dry.          Assessment & Plan:

## 2011-02-05 NOTE — Assessment & Plan Note (Signed)
This pt has had multiple recurrent episodes of yeast and BV. Will rx diflucan and flagyl and will put several refills so she can self treat.

## 2011-02-05 NOTE — Patient Instructions (Signed)
Great to see you [place]. I am giving you diflucan and flagyl, with 11 refills so you can self treat if this recurs. If the discharge is whitish, itchy, clumpy then its likely yeast and you should take 1 diflucan. It the discharge is watery, smelly, irritating then its likely BV and you should take Flagyl 2x a day for 7d. Come back to see [place] as needed.  Gwen Her. Dianah Field, M.D. [place] [address] [place] [address] [phone]

## 2011-02-09 ENCOUNTER — Encounter: Payer: Self-pay | Admitting: Sports Medicine

## 2011-02-19 ENCOUNTER — Ambulatory Visit: Payer: Medicaid Other | Admitting: Family Medicine

## 2011-04-29 ENCOUNTER — Ambulatory Visit (INDEPENDENT_AMBULATORY_CARE_PROVIDER_SITE_OTHER): Payer: Medicaid Other

## 2011-04-29 VITALS — BP 104/62 | Temp 98.0°F | Wt 124.8 lb

## 2011-04-29 DIAGNOSIS — R300 Dysuria: Secondary | ICD-10-CM

## 2011-04-29 DIAGNOSIS — B9689 Other specified bacterial agents as the cause of diseases classified elsewhere: Secondary | ICD-10-CM

## 2011-04-29 DIAGNOSIS — N390 Urinary tract infection, site not specified: Secondary | ICD-10-CM

## 2011-04-29 DIAGNOSIS — N760 Acute vaginitis: Secondary | ICD-10-CM | POA: Insufficient documentation

## 2011-04-29 LAB — POCT URINALYSIS DIPSTICK
Bilirubin, UA: NEGATIVE
Glucose, UA: NEGATIVE
Ketones, UA: NEGATIVE
Nitrite, UA: POSITIVE
Protein, UA: >=300
Spec Grav, UA: 1.015
Urobilinogen, UA: 0.2
pH, UA: 7

## 2011-04-29 MED ORDER — CEPHALEXIN 500 MG PO CAPS: 500.0000 mg | ORAL_CAPSULE | Freq: Two times a day (BID) | ORAL | Status: DC

## 2011-04-29 MED ORDER — METRONIDAZOLE 0.75 % VA GEL: 1.0000 | Freq: Two times a day (BID) | VAGINAL | Status: AC

## 2011-04-29 NOTE — Assessment & Plan Note (Signed)
Patient with persistent dysuria and frequency, consistent with prior diagnosis of cystitis in the past. In looking in [facility], it appears she was last treated for cystitis in November 2011.  Absence of fevers/chills or vomiting suggests simple lower UTI.  To treat with Keflex and check results of UCx, to contact patient if needed based on culture results.  She agrees with this plan.  For HD tomorrow; to take Keflex after HD treatment.

## 2011-04-29 NOTE — Progress Notes (Signed)
  Subjective:    Patient ID: Candace Griffin, female    DOB: [birth year], 35 y.o.   MRN: [id number]  HPI Patient here for acute visit, complaint of polyuria and dysuria ("burning") for the past 1-1/2 weeks.  Began with bilat low back pain about 2 weeks ago, lasted a couple days and then the back pain subsided, gave way to the dysuria she has had since.  No fevers/chills, no nausea/vomiting.  Has had some loose stools 3x/day for the past 2 days.  No blood or mucus in stools.   Patient has been on HD with AV fistula in R arm, for the past 1 yr.  ESRD secondary to glomerulonephritis since age 78.     Review of Systems  On ROS she remarks about some odorless white vaginal discharge over the past several days.  Reminiscent of prior BV, except without the odor of BV.  No sexual intercourse in about 5 months; monogamous with same female partner for the past 1-1/2 years, uses condoms.  No pelvic pain.     Objective:   Physical Exam Thin, generally well appearing, no apparent distress.  HEENT Neck supple. MMM. No cervical adenopathy. COR RRR, no extra sounds PULM Clear bilaterally. No rales or wheezes ABD Soft, flat, no CVA tenderness.  Positive suprapubic tenderness. Audible bowel sounds. No masses or megaly.        Assessment & Plan:

## 2011-04-29 NOTE — Patient Instructions (Signed)
It was a pleasure to see you today.  We are treating you for a simple bladder infection.  I will call you if there is a need to change your antibiotics.   I sent the Keflex 500mg  capsules, take 1 capsule twice daily for the next 7 days. Take after HD treatment.   The vaginal metronidazole may be used at bedtime daily for 5 days

## 2011-05-02 LAB — CULTURE, URINE COMPREHENSIVE: Colony Count: 50000

## 2011-05-04 ENCOUNTER — Telehealth: Payer: Self-pay | Admitting: Family Medicine

## 2011-05-04 MED ORDER — CIPROFLOXACIN HCL 250 MG PO TABS: 250.0000 mg | ORAL_TABLET | Freq: Two times a day (BID) | ORAL | Status: AC

## 2011-05-04 NOTE — Telephone Encounter (Signed)
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  I wish to change her antibiotic to Cipro based on antibiogram for Enterobacter which grew in her urine.  I will send a letter and prescribe to her pharmacy. Paton Crum O

## 2011-05-07 ENCOUNTER — Ambulatory Visit: Payer: Self-pay | Admitting: Family Medicine

## 2011-05-14 LAB — POCT URINALYSIS DIP (DEVICE)
Bilirubin Urine: NEGATIVE
Glucose, UA: NEGATIVE
Ketones, ur: NEGATIVE
Nitrite: NEGATIVE
Operator id: 148111
Protein, ur: >=300 — AB
Specific Gravity, Urine: 1.015
Urobilinogen, UA: 0.2
pH: 5.5

## 2011-05-15 LAB — POCT URINALYSIS DIP (DEVICE)
Bilirubin Urine: NEGATIVE
Bilirubin Urine: NEGATIVE
Bilirubin Urine: NEGATIVE
Bilirubin Urine: NEGATIVE
Glucose, UA: NEGATIVE
Glucose, UA: NEGATIVE
Glucose, UA: NEGATIVE
Glucose, UA: NEGATIVE
Ketones, ur: NEGATIVE
Ketones, ur: NEGATIVE
Ketones, ur: NEGATIVE
Ketones, ur: NEGATIVE
Nitrite: NEGATIVE
Nitrite: NEGATIVE
Nitrite: NEGATIVE
Nitrite: NEGATIVE
Operator id: 120861
Operator id: 148111
Operator id: 148111
Operator id: 297281
Protein, ur: >=300 — AB
Protein, ur: >=300 — AB
Protein, ur: >=300 — AB
Protein, ur: >=300 — AB
Specific Gravity, Urine: 1.01
Specific Gravity, Urine: 1.01
Specific Gravity, Urine: 1.01
Specific Gravity, Urine: 1.015
Urobilinogen, UA: 0.2
Urobilinogen, UA: 0.2
Urobilinogen, UA: 0.2
Urobilinogen, UA: 0.2
pH: 5.5
pH: 6
pH: 5.5
pH: 5.5

## 2011-05-18 LAB — POCT URINALYSIS DIP (DEVICE)
Bilirubin Urine: NEGATIVE
Bilirubin Urine: NEGATIVE
Glucose, UA: NEGATIVE
Glucose, UA: NEGATIVE
Ketones, ur: NEGATIVE
Ketones, ur: NEGATIVE
Nitrite: NEGATIVE
Nitrite: NEGATIVE
Operator id: 148111
Operator id: 200901
Protein, ur: >=300 — AB
Protein, ur: >=300 — AB
Specific Gravity, Urine: 1.015
Specific Gravity, Urine: <=1.005
Urobilinogen, UA: 0.2
Urobilinogen, UA: 0.2
pH: 5.5
pH: 5

## 2011-05-19 LAB — BASIC METABOLIC PANEL
BUN: 33 — ABNORMAL HIGH
BUN: 51 — ABNORMAL HIGH
BUN: 51 — ABNORMAL HIGH
CO2: 20
CO2: 20
CO2: 22
Calcium: 8.9
Calcium: 9
Calcium: 9.3
Chloride: 106
Chloride: 107
Chloride: 107
Creatinine, Ser: 3.36 — ABNORMAL HIGH
Creatinine, Ser: 3.4 — ABNORMAL HIGH
Creatinine, Ser: 3.59 — ABNORMAL HIGH
GFR calc Af Amer: 18 — ABNORMAL LOW
GFR calc Af Amer: 19 — ABNORMAL LOW
GFR calc Af Amer: 19 — ABNORMAL LOW
GFR calc non Af Amer: 15 — ABNORMAL LOW
GFR calc non Af Amer: 16 — ABNORMAL LOW
GFR calc non Af Amer: 16 — ABNORMAL LOW
Glucose, Bld: 77
Glucose, Bld: 80
Glucose, Bld: 82
Potassium: 4.3
Potassium: 4.4
Potassium: 4.5
Sodium: 135
Sodium: 135
Sodium: 136

## 2011-05-19 LAB — RENAL FUNCTION PANEL
Albumin: 2.5 — ABNORMAL LOW
Albumin: 2.5 — ABNORMAL LOW
Albumin: 2.5 — ABNORMAL LOW
Albumin: 2.5 — ABNORMAL LOW
Albumin: 2.6 — ABNORMAL LOW
BUN: 40 — ABNORMAL HIGH
BUN: 42 — ABNORMAL HIGH
BUN: 44 — ABNORMAL HIGH
BUN: 48 — ABNORMAL HIGH
BUN: 51 — ABNORMAL HIGH
CO2: 19
CO2: 19
CO2: 20
CO2: 20
CO2: 20
Calcium: 8.7
Calcium: 8.7
Calcium: 9
Calcium: 9
Calcium: 9.1
Chloride: 106
Chloride: 106
Chloride: 107
Chloride: 107
Chloride: 108
Creatinine, Ser: 3.2 — ABNORMAL HIGH
Creatinine, Ser: 3.36 — ABNORMAL HIGH
Creatinine, Ser: 3.48 — ABNORMAL HIGH
Creatinine, Ser: 3.54 — ABNORMAL HIGH
Creatinine, Ser: 3.59 — ABNORMAL HIGH
GFR calc Af Amer: 18 — ABNORMAL LOW
GFR calc Af Amer: 18 — ABNORMAL LOW
GFR calc Af Amer: 19 — ABNORMAL LOW
GFR calc Af Amer: 19 — ABNORMAL LOW
GFR calc Af Amer: 20 — ABNORMAL LOW
GFR calc non Af Amer: 15 — ABNORMAL LOW
GFR calc non Af Amer: 15 — ABNORMAL LOW
GFR calc non Af Amer: 15 — ABNORMAL LOW
GFR calc non Af Amer: 16 — ABNORMAL LOW
GFR calc non Af Amer: 17 — ABNORMAL LOW
Glucose, Bld: 109 — ABNORMAL HIGH
Glucose, Bld: 118 — ABNORMAL HIGH
Glucose, Bld: 78
Glucose, Bld: 80
Glucose, Bld: 82
Phosphorus: 4
Phosphorus: 4.5
Phosphorus: 4.8 — ABNORMAL HIGH
Phosphorus: 4.9 — ABNORMAL HIGH
Phosphorus: 5.1 — ABNORMAL HIGH
Potassium: 3.8
Potassium: 4.1
Potassium: 4.2
Potassium: 4.3
Potassium: 4.4
Sodium: 130 — ABNORMAL LOW
Sodium: 135
Sodium: 135
Sodium: 135
Sodium: 136

## 2011-05-19 LAB — URINALYSIS, ROUTINE W REFLEX MICROSCOPIC
Bilirubin Urine: NEGATIVE
Glucose, UA: NEGATIVE
Ketones, ur: NEGATIVE
Nitrite: NEGATIVE
Protein, ur: 100 — AB
Specific Gravity, Urine: 1.01
Urobilinogen, UA: 0.2
pH: 6

## 2011-05-19 LAB — CBC
HCT: 28 — ABNORMAL LOW
Hemoglobin: 10 — ABNORMAL LOW
MCHC: 35.6
MCV: 93.4
Platelets: 200
RBC: 3 — ABNORMAL LOW
RDW: 13.4
WBC: 11.6 — ABNORMAL HIGH

## 2011-05-19 LAB — URINE CULTURE
Colony Count: NO GROWTH
Culture: NO GROWTH
Special Requests: NEGATIVE

## 2011-05-19 LAB — T3: T3, Total: 213.8 — ABNORMAL HIGH (ref 80.0–204.0)

## 2011-05-19 LAB — COMPREHENSIVE METABOLIC PANEL
ALT: 16
ALT: 17
AST: 18
AST: 19
Albumin: 2.8 — ABNORMAL LOW
Albumin: 2.9 — ABNORMAL LOW
Alkaline Phosphatase: 55
Alkaline Phosphatase: 58
BUN: 34 — ABNORMAL HIGH
BUN: 38 — ABNORMAL HIGH
CO2: 18 — ABNORMAL LOW
CO2: 21
Calcium: 9.3
Calcium: 9.5
Chloride: 108
Chloride: 108
Creatinine, Ser: 3.43 — ABNORMAL HIGH
Creatinine, Ser: 3.48 — ABNORMAL HIGH
GFR calc Af Amer: 19 — ABNORMAL LOW
GFR calc Af Amer: 19 — ABNORMAL LOW
GFR calc non Af Amer: 15 — ABNORMAL LOW
GFR calc non Af Amer: 16 — ABNORMAL LOW
Glucose, Bld: 144 — ABNORMAL HIGH
Glucose, Bld: 263 — ABNORMAL HIGH
Potassium: 4.4
Potassium: 4.5
Sodium: 135
Sodium: 137
Total Bilirubin: 0.4
Total Bilirubin: 0.5
Total Protein: 6.2
Total Protein: 6.2

## 2011-05-19 LAB — POCT URINALYSIS DIP (DEVICE)
Bilirubin Urine: NEGATIVE
Glucose, UA: NEGATIVE
Ketones, ur: NEGATIVE
Nitrite: NEGATIVE
Operator id: 148111
Protein, ur: 100 — AB
Specific Gravity, Urine: 1.01
Urobilinogen, UA: 0.2
pH: 5

## 2011-05-19 LAB — PROTEIN, URINE, 24 HOUR
Collection Interval-UPROT: 24
Protein, 24H Urine: 1428 — ABNORMAL HIGH
Protein, Urine: 68
Urine Total Volume-UPROT: 2100

## 2011-05-19 LAB — FERRITIN: Ferritin: 16 (ref 10–291)

## 2011-05-19 LAB — HEMOGLOBIN: Hemoglobin: 9.5 — ABNORMAL LOW

## 2011-05-19 LAB — CREATININE CLEARANCE, URINE, 24 HOUR
Collection Interval-CRCL: 24
Collection Interval-CRCL: 24
Creatinine Clearance: 21 — ABNORMAL LOW
Creatinine Clearance: 26 — ABNORMAL LOW
Creatinine, 24H Ur: 1106
Creatinine, 24H Ur: 1260
Creatinine, Urine: 46.1
Creatinine, Urine: 60
Creatinine: 3.43 — ABNORMAL HIGH
Creatinine: 3.59 — ABNORMAL HIGH
Urine Total Volume-CRCL: 2100
Urine Total Volume-CRCL: 2400

## 2011-05-19 LAB — IRON AND TIBC
Iron: 36 — ABNORMAL LOW
Saturation Ratios: 10 — ABNORMAL LOW
TIBC: 374
UIBC: 338

## 2011-05-19 LAB — URINE MICROSCOPIC-ADD ON

## 2011-05-19 LAB — T4, FREE: Free T4: 1.07

## 2011-05-19 LAB — TSH: TSH: 0.419

## 2011-05-19 LAB — RPR: RPR Ser Ql: NONREACTIVE

## 2011-05-20 LAB — RENAL FUNCTION PANEL
Albumin: 2.5 — ABNORMAL LOW
Albumin: 2.6 — ABNORMAL LOW
Albumin: 2.6 — ABNORMAL LOW
Albumin: 2.6 — ABNORMAL LOW
Albumin: 2.6 — ABNORMAL LOW
Albumin: 2.7 — ABNORMAL LOW
Albumin: 2.5 — ABNORMAL LOW
Albumin: 2.6 — ABNORMAL LOW
Albumin: 2.6 — ABNORMAL LOW
Albumin: 2.6 — ABNORMAL LOW
BUN: 36 — ABNORMAL HIGH
BUN: 37 — ABNORMAL HIGH
BUN: 41 — ABNORMAL HIGH
BUN: 43 — ABNORMAL HIGH
BUN: 45 — ABNORMAL HIGH
BUN: 50 — ABNORMAL HIGH
BUN: 33 — ABNORMAL HIGH
BUN: 33 — ABNORMAL HIGH
BUN: 36 — ABNORMAL HIGH
BUN: 37 — ABNORMAL HIGH
CO2: 19
CO2: 20
CO2: 20
CO2: 21
CO2: 23
CO2: 23
CO2: 18 — ABNORMAL LOW
CO2: 20
CO2: 20
CO2: 22
Calcium: 8.9
Calcium: 9
Calcium: 9.1
Calcium: 9.2
Calcium: 9.2
Calcium: 9.2
Calcium: 8.8
Calcium: 8.9
Calcium: 9
Calcium: 9.1
Chloride: 104
Chloride: 107
Chloride: 108
Chloride: 108
Chloride: 108
Chloride: 110
Chloride: 108
Chloride: 109
Chloride: 109
Chloride: 110
Creatinine, Ser: 3.27 — ABNORMAL HIGH
Creatinine, Ser: 3.39 — ABNORMAL HIGH
Creatinine, Ser: 3.5 — ABNORMAL HIGH
Creatinine, Ser: 3.52 — ABNORMAL HIGH
Creatinine, Ser: 3.54 — ABNORMAL HIGH
Creatinine, Ser: 3.59 — ABNORMAL HIGH
Creatinine, Ser: 3.48 — ABNORMAL HIGH
Creatinine, Ser: 3.52 — ABNORMAL HIGH
Creatinine, Ser: 3.64 — ABNORMAL HIGH
Creatinine, Ser: 3.89 — ABNORMAL HIGH
GFR calc Af Amer: 18 — ABNORMAL LOW
GFR calc Af Amer: 18 — ABNORMAL LOW
GFR calc Af Amer: 18 — ABNORMAL LOW
GFR calc Af Amer: 18 — ABNORMAL LOW
GFR calc Af Amer: 19 — ABNORMAL LOW
GFR calc Af Amer: 20 — ABNORMAL LOW
GFR calc Af Amer: 16 — ABNORMAL LOW
GFR calc Af Amer: 18 — ABNORMAL LOW
GFR calc Af Amer: 18 — ABNORMAL LOW
GFR calc Af Amer: 19 — ABNORMAL LOW
GFR calc non Af Amer: 15 — ABNORMAL LOW
GFR calc non Af Amer: 15 — ABNORMAL LOW
GFR calc non Af Amer: 15 — ABNORMAL LOW
GFR calc non Af Amer: 15 — ABNORMAL LOW
GFR calc non Af Amer: 16 — ABNORMAL LOW
GFR calc non Af Amer: 16 — ABNORMAL LOW
GFR calc non Af Amer: 13 — ABNORMAL LOW
GFR calc non Af Amer: 15 — ABNORMAL LOW
GFR calc non Af Amer: 15 — ABNORMAL LOW
GFR calc non Af Amer: 15 — ABNORMAL LOW
Glucose, Bld: 65 — ABNORMAL LOW
Glucose, Bld: 70
Glucose, Bld: 71
Glucose, Bld: 75
Glucose, Bld: 76
Glucose, Bld: 76
Glucose, Bld: 108 — ABNORMAL HIGH
Glucose, Bld: 65 — ABNORMAL LOW
Glucose, Bld: 66 — ABNORMAL LOW
Glucose, Bld: 77
Phosphorus: 3.8
Phosphorus: 4.2
Phosphorus: 4.7 — ABNORMAL HIGH
Phosphorus: 5 — ABNORMAL HIGH
Phosphorus: 5 — ABNORMAL HIGH
Phosphorus: 5.3 — ABNORMAL HIGH
Phosphorus: 3.5
Phosphorus: 4
Phosphorus: 4.6
Phosphorus: 4.8 — ABNORMAL HIGH
Potassium: 3.9
Potassium: 4
Potassium: 4.2
Potassium: 4.3
Potassium: 4.4
Potassium: 4.5
Potassium: 3.8
Potassium: 3.9
Potassium: 4.1
Potassium: 4.1
Sodium: 133 — ABNORMAL LOW
Sodium: 135
Sodium: 136
Sodium: 136
Sodium: 137
Sodium: 138
Sodium: 135
Sodium: 136
Sodium: 136
Sodium: 137

## 2011-05-20 LAB — BASIC METABOLIC PANEL
BUN: 37 — ABNORMAL HIGH
BUN: 44 — ABNORMAL HIGH
BUN: 32 — ABNORMAL HIGH
BUN: 33 — ABNORMAL HIGH
CO2: 19
CO2: 20
CO2: 17 — ABNORMAL LOW
CO2: 19
Calcium: 8.9
Calcium: 9.1
Calcium: 9
Calcium: 9
Chloride: 108
Chloride: 109
Chloride: 107
Chloride: 108
Creatinine, Ser: 3.42 — ABNORMAL HIGH
Creatinine, Ser: 3.49 — ABNORMAL HIGH
Creatinine, Ser: 3.47 — ABNORMAL HIGH
Creatinine, Ser: 3.66 — ABNORMAL HIGH
GFR calc Af Amer: 19 — ABNORMAL LOW
GFR calc Af Amer: 19 — ABNORMAL LOW
GFR calc Af Amer: 18 — ABNORMAL LOW
GFR calc Af Amer: 19 — ABNORMAL LOW
GFR calc non Af Amer: 15 — ABNORMAL LOW
GFR calc non Af Amer: 16 — ABNORMAL LOW
GFR calc non Af Amer: 14 — ABNORMAL LOW
GFR calc non Af Amer: 15 — ABNORMAL LOW
Glucose, Bld: 69 — ABNORMAL LOW
Glucose, Bld: 76
Glucose, Bld: 110 — ABNORMAL HIGH
Glucose, Bld: 76
Potassium: 4.2
Potassium: 4.4
Potassium: 3.8
Potassium: 4.2
Sodium: 136
Sodium: 137
Sodium: 132 — ABNORMAL LOW
Sodium: 135

## 2011-05-20 LAB — CBC
HCT: 26.1 — ABNORMAL LOW
HCT: 27.5 — ABNORMAL LOW
Hemoglobin: 9.1 — ABNORMAL LOW
Hemoglobin: 9.8 — ABNORMAL LOW
MCHC: 35
MCHC: 35.6
MCV: 91.4
MCV: 92.6
Platelets: 194
Platelets: 205
RBC: 2.82 — ABNORMAL LOW
RBC: 3.01 — ABNORMAL LOW
RDW: 13.1
RDW: 13.6
WBC: 7.8
WBC: 8.9

## 2011-05-20 LAB — TYPE AND SCREEN
ABO/RH(D): O POS
Antibody Screen: NEGATIVE

## 2011-05-20 LAB — WET PREP, GENITAL
Clue Cells Wet Prep HPF POC: NONE SEEN
Trich, Wet Prep: NONE SEEN
Yeast Wet Prep HPF POC: NONE SEEN

## 2011-05-20 LAB — ABO/RH: ABO/RH(D): O POS

## 2011-05-20 LAB — HEMOGLOBIN AND HEMATOCRIT, BLOOD
HCT: 27 — ABNORMAL LOW
Hemoglobin: 9.6 — ABNORMAL LOW

## 2011-05-21 ENCOUNTER — Ambulatory Visit: Payer: Medicaid Other | Admitting: Family Medicine

## 2011-06-02 LAB — URINALYSIS, ROUTINE W REFLEX MICROSCOPIC
Bilirubin Urine: NEGATIVE
Glucose, UA: NEGATIVE
Ketones, ur: NEGATIVE
Nitrite: NEGATIVE
Protein, ur: 100 — AB
Specific Gravity, Urine: 1.015
Urobilinogen, UA: 0.2
pH: 6

## 2011-06-02 LAB — URINE CULTURE: Colony Count: 3000

## 2011-06-02 LAB — URINE MICROSCOPIC-ADD ON: RBC / HPF: NONE SEEN

## 2011-06-02 LAB — GC/CHLAMYDIA PROBE AMP, GENITAL
Chlamydia, DNA Probe: NEGATIVE
GC Probe Amp, Genital: NEGATIVE

## 2011-06-02 LAB — WET PREP, GENITAL
Clue Cells Wet Prep HPF POC: NONE SEEN
Trich, Wet Prep: NONE SEEN
Yeast Wet Prep HPF POC: NONE SEEN

## 2011-06-03 LAB — CBC
HCT: 35.2 — ABNORMAL LOW
Hemoglobin: 12.3
MCHC: 34.9
MCV: 89.2
Platelets: 221
RBC: 3.95
RDW: 13.1
WBC: 6.6

## 2011-06-03 LAB — GC/CHLAMYDIA PROBE AMP, GENITAL
Chlamydia, DNA Probe: NEGATIVE
GC Probe Amp, Genital: NEGATIVE

## 2011-06-03 LAB — URINALYSIS, ROUTINE W REFLEX MICROSCOPIC
Bilirubin Urine: NEGATIVE
Glucose, UA: NEGATIVE
Ketones, ur: NEGATIVE
Nitrite: NEGATIVE
Protein, ur: 100 — AB
Specific Gravity, Urine: 1.025
Urobilinogen, UA: 0.2
pH: 5.5

## 2011-06-03 LAB — URINE CULTURE: Colony Count: 7000

## 2011-06-03 LAB — URINE MICROSCOPIC-ADD ON

## 2011-06-03 LAB — WET PREP, GENITAL
Trich, Wet Prep: NONE SEEN
Yeast Wet Prep HPF POC: NONE SEEN

## 2011-06-03 LAB — POCT PREGNANCY, URINE
Operator id: 266701
Preg Test, Ur: POSITIVE

## 2011-06-03 LAB — HCG, QUANTITATIVE, PREGNANCY: hCG, Beta Chain, Quant, S: 7156 — ABNORMAL HIGH

## 2011-07-25 ENCOUNTER — Encounter (HOSPITAL_COMMUNITY): Payer: Self-pay

## 2011-07-25 DIAGNOSIS — M545 Low back pain, unspecified: Secondary | ICD-10-CM | POA: Insufficient documentation

## 2011-07-25 DIAGNOSIS — Z992 Dependence on renal dialysis: Secondary | ICD-10-CM | POA: Insufficient documentation
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
## 2011-07-26 ENCOUNTER — Emergency Department (HOSPITAL_COMMUNITY)
Admission: EM | Admit: 2011-07-26 | Discharge: 2011-07-26 | Disposition: A | Discharge status: Home / Self Care | Payer: No Typology Code available for payment source | Attending: Emergency Medicine | Admitting: Emergency Medicine

## 2011-07-26 DIAGNOSIS — M545 Low back pain, unspecified: Secondary | ICD-10-CM

## 2011-07-26 HISTORY — DX: Essential (primary) hypertension: I10

## 2011-07-26 HISTORY — DX: Dependence on renal dialysis: Z99.2

## 2011-07-26 MED ORDER — HYDROCODONE-ACETAMINOPHEN 5-325 MG PO TABS: 1.0000 | ORAL_TABLET | ORAL | Status: AC | PRN

## 2011-07-26 MED ORDER — CYCLOBENZAPRINE HCL 10 MG PO TABS: 10.0000 mg | ORAL_TABLET | Freq: Two times a day (BID) | ORAL | Status: AC | PRN

## 2011-07-26 MED ORDER — IBUPROFEN 800 MG PO TABS: 800.0000 mg | ORAL_TABLET | Freq: Once | ORAL | Status: DC

## 2011-07-26 MED ORDER — IBUPROFEN 800 MG PO TABS: 800.0000 mg | ORAL_TABLET | Freq: Three times a day (TID) | ORAL | Status: AC
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
## 2011-07-27 NOTE — ED Provider Notes (Signed)
Medical screening examination/treatment/procedure(s) were performed by non-physician practitioner and as supervising physician I was immediately available for consultation/collaboration.   Carmin Muskrat, MD 07/27/11 [phone]

## 2011-08-26 DIAGNOSIS — N039 Chronic nephritic syndrome with unspecified morphologic changes: Secondary | ICD-10-CM | POA: Diagnosis not present

## 2011-08-26 DIAGNOSIS — N2581 Secondary hyperparathyroidism of renal origin: Secondary | ICD-10-CM | POA: Diagnosis not present

## 2011-08-26 DIAGNOSIS — N186 End stage renal disease: Secondary | ICD-10-CM | POA: Diagnosis not present

## 2011-08-26 DIAGNOSIS — D509 Iron deficiency anemia, unspecified: Secondary | ICD-10-CM | POA: Diagnosis not present

## 2011-08-26 DIAGNOSIS — D631 Anemia in chronic kidney disease: Secondary | ICD-10-CM | POA: Diagnosis not present

## 2011-08-28 DIAGNOSIS — D509 Iron deficiency anemia, unspecified: Secondary | ICD-10-CM | POA: Diagnosis not present

## 2011-08-28 DIAGNOSIS — D631 Anemia in chronic kidney disease: Secondary | ICD-10-CM | POA: Diagnosis not present

## 2011-08-28 DIAGNOSIS — N186 End stage renal disease: Secondary | ICD-10-CM | POA: Diagnosis not present

## 2011-08-28 DIAGNOSIS — N2581 Secondary hyperparathyroidism of renal origin: Secondary | ICD-10-CM | POA: Diagnosis not present

## 2011-08-31 DIAGNOSIS — N186 End stage renal disease: Secondary | ICD-10-CM | POA: Diagnosis not present

## 2011-08-31 DIAGNOSIS — D631 Anemia in chronic kidney disease: Secondary | ICD-10-CM | POA: Diagnosis not present

## 2011-08-31 DIAGNOSIS — N2581 Secondary hyperparathyroidism of renal origin: Secondary | ICD-10-CM | POA: Diagnosis not present

## 2011-08-31 DIAGNOSIS — D509 Iron deficiency anemia, unspecified: Secondary | ICD-10-CM | POA: Diagnosis not present

## 2011-09-02 DIAGNOSIS — N186 End stage renal disease: Secondary | ICD-10-CM | POA: Diagnosis not present

## 2011-09-02 DIAGNOSIS — N2581 Secondary hyperparathyroidism of renal origin: Secondary | ICD-10-CM | POA: Diagnosis not present

## 2011-09-02 DIAGNOSIS — D631 Anemia in chronic kidney disease: Secondary | ICD-10-CM | POA: Diagnosis not present

## 2011-09-02 DIAGNOSIS — D509 Iron deficiency anemia, unspecified: Secondary | ICD-10-CM | POA: Diagnosis not present

## 2011-09-02 DIAGNOSIS — N039 Chronic nephritic syndrome with unspecified morphologic changes: Secondary | ICD-10-CM | POA: Diagnosis not present

## 2011-09-04 DIAGNOSIS — N186 End stage renal disease: Secondary | ICD-10-CM | POA: Diagnosis not present

## 2011-09-04 DIAGNOSIS — D631 Anemia in chronic kidney disease: Secondary | ICD-10-CM | POA: Diagnosis not present

## 2011-09-04 DIAGNOSIS — N2581 Secondary hyperparathyroidism of renal origin: Secondary | ICD-10-CM | POA: Diagnosis not present

## 2011-09-04 DIAGNOSIS — D509 Iron deficiency anemia, unspecified: Secondary | ICD-10-CM | POA: Diagnosis not present

## 2011-09-04 DIAGNOSIS — N039 Chronic nephritic syndrome with unspecified morphologic changes: Secondary | ICD-10-CM | POA: Diagnosis not present

## 2011-09-07 DIAGNOSIS — D509 Iron deficiency anemia, unspecified: Secondary | ICD-10-CM | POA: Diagnosis not present

## 2011-09-07 DIAGNOSIS — N039 Chronic nephritic syndrome with unspecified morphologic changes: Secondary | ICD-10-CM | POA: Diagnosis not present

## 2011-09-07 DIAGNOSIS — D631 Anemia in chronic kidney disease: Secondary | ICD-10-CM | POA: Diagnosis not present

## 2011-09-07 DIAGNOSIS — N186 End stage renal disease: Secondary | ICD-10-CM | POA: Diagnosis not present

## 2011-09-07 DIAGNOSIS — N2581 Secondary hyperparathyroidism of renal origin: Secondary | ICD-10-CM | POA: Diagnosis not present

## 2011-09-09 DIAGNOSIS — D509 Iron deficiency anemia, unspecified: Secondary | ICD-10-CM | POA: Diagnosis not present

## 2011-09-09 DIAGNOSIS — D631 Anemia in chronic kidney disease: Secondary | ICD-10-CM | POA: Diagnosis not present

## 2011-09-09 DIAGNOSIS — N186 End stage renal disease: Secondary | ICD-10-CM | POA: Diagnosis not present

## 2011-09-09 DIAGNOSIS — N2581 Secondary hyperparathyroidism of renal origin: Secondary | ICD-10-CM | POA: Diagnosis not present

## 2011-09-09 DIAGNOSIS — N039 Chronic nephritic syndrome with unspecified morphologic changes: Secondary | ICD-10-CM | POA: Diagnosis not present

## 2011-09-14 DIAGNOSIS — N186 End stage renal disease: Secondary | ICD-10-CM | POA: Diagnosis not present

## 2011-09-14 DIAGNOSIS — N2581 Secondary hyperparathyroidism of renal origin: Secondary | ICD-10-CM | POA: Diagnosis not present

## 2011-09-14 DIAGNOSIS — D509 Iron deficiency anemia, unspecified: Secondary | ICD-10-CM | POA: Diagnosis not present

## 2011-09-14 DIAGNOSIS — N039 Chronic nephritic syndrome with unspecified morphologic changes: Secondary | ICD-10-CM | POA: Diagnosis not present

## 2011-09-14 DIAGNOSIS — D631 Anemia in chronic kidney disease: Secondary | ICD-10-CM | POA: Diagnosis not present

## 2011-09-16 DIAGNOSIS — D631 Anemia in chronic kidney disease: Secondary | ICD-10-CM | POA: Diagnosis not present

## 2011-09-16 DIAGNOSIS — N186 End stage renal disease: Secondary | ICD-10-CM | POA: Diagnosis not present

## 2011-09-16 DIAGNOSIS — Z992 Dependence on renal dialysis: Secondary | ICD-10-CM | POA: Diagnosis not present

## 2011-09-16 DIAGNOSIS — D509 Iron deficiency anemia, unspecified: Secondary | ICD-10-CM | POA: Diagnosis not present

## 2011-09-16 DIAGNOSIS — N2581 Secondary hyperparathyroidism of renal origin: Secondary | ICD-10-CM | POA: Diagnosis not present

## 2011-09-18 DIAGNOSIS — D509 Iron deficiency anemia, unspecified: Secondary | ICD-10-CM | POA: Diagnosis not present

## 2011-09-18 DIAGNOSIS — D631 Anemia in chronic kidney disease: Secondary | ICD-10-CM | POA: Diagnosis not present

## 2011-09-18 DIAGNOSIS — N186 End stage renal disease: Secondary | ICD-10-CM | POA: Diagnosis not present

## 2011-09-18 DIAGNOSIS — N2581 Secondary hyperparathyroidism of renal origin: Secondary | ICD-10-CM | POA: Diagnosis not present

## 2011-09-21 DIAGNOSIS — D509 Iron deficiency anemia, unspecified: Secondary | ICD-10-CM | POA: Diagnosis not present

## 2011-09-21 DIAGNOSIS — D631 Anemia in chronic kidney disease: Secondary | ICD-10-CM | POA: Diagnosis not present

## 2011-09-21 DIAGNOSIS — N186 End stage renal disease: Secondary | ICD-10-CM | POA: Diagnosis not present

## 2011-09-21 DIAGNOSIS — N039 Chronic nephritic syndrome with unspecified morphologic changes: Secondary | ICD-10-CM | POA: Diagnosis not present

## 2011-09-21 DIAGNOSIS — N2581 Secondary hyperparathyroidism of renal origin: Secondary | ICD-10-CM | POA: Diagnosis not present

## 2011-09-23 DIAGNOSIS — D631 Anemia in chronic kidney disease: Secondary | ICD-10-CM | POA: Diagnosis not present

## 2011-09-23 DIAGNOSIS — D509 Iron deficiency anemia, unspecified: Secondary | ICD-10-CM | POA: Diagnosis not present

## 2011-09-23 DIAGNOSIS — N039 Chronic nephritic syndrome with unspecified morphologic changes: Secondary | ICD-10-CM | POA: Diagnosis not present

## 2011-09-23 DIAGNOSIS — N2581 Secondary hyperparathyroidism of renal origin: Secondary | ICD-10-CM | POA: Diagnosis not present

## 2011-09-23 DIAGNOSIS — N186 End stage renal disease: Secondary | ICD-10-CM | POA: Diagnosis not present

## 2011-09-24 DIAGNOSIS — N039 Chronic nephritic syndrome with unspecified morphologic changes: Secondary | ICD-10-CM | POA: Diagnosis not present

## 2011-09-24 DIAGNOSIS — D509 Iron deficiency anemia, unspecified: Secondary | ICD-10-CM | POA: Diagnosis not present

## 2011-09-24 DIAGNOSIS — N186 End stage renal disease: Secondary | ICD-10-CM | POA: Diagnosis not present

## 2011-09-24 DIAGNOSIS — D631 Anemia in chronic kidney disease: Secondary | ICD-10-CM | POA: Diagnosis not present

## 2011-09-24 DIAGNOSIS — N2581 Secondary hyperparathyroidism of renal origin: Secondary | ICD-10-CM | POA: Diagnosis not present

## 2011-09-28 DIAGNOSIS — N2581 Secondary hyperparathyroidism of renal origin: Secondary | ICD-10-CM | POA: Diagnosis not present

## 2011-09-28 DIAGNOSIS — D509 Iron deficiency anemia, unspecified: Secondary | ICD-10-CM | POA: Diagnosis not present

## 2011-09-28 DIAGNOSIS — N186 End stage renal disease: Secondary | ICD-10-CM | POA: Diagnosis not present

## 2011-09-28 DIAGNOSIS — N039 Chronic nephritic syndrome with unspecified morphologic changes: Secondary | ICD-10-CM | POA: Diagnosis not present

## 2011-09-28 DIAGNOSIS — D631 Anemia in chronic kidney disease: Secondary | ICD-10-CM | POA: Diagnosis not present

## 2011-09-29 DIAGNOSIS — N186 End stage renal disease: Secondary | ICD-10-CM | POA: Diagnosis not present

## 2011-09-30 DIAGNOSIS — N186 End stage renal disease: Secondary | ICD-10-CM | POA: Diagnosis present

## 2011-09-30 DIAGNOSIS — N039 Chronic nephritic syndrome with unspecified morphologic changes: Secondary | ICD-10-CM | POA: Diagnosis not present

## 2011-09-30 DIAGNOSIS — Z7682 Awaiting organ transplant status: Secondary | ICD-10-CM | POA: Diagnosis not present

## 2011-09-30 DIAGNOSIS — N179 Acute kidney failure, unspecified: Secondary | ICD-10-CM | POA: Diagnosis not present

## 2011-09-30 DIAGNOSIS — E876 Hypokalemia: Secondary | ICD-10-CM | POA: Diagnosis not present

## 2011-09-30 DIAGNOSIS — R634 Abnormal weight loss: Secondary | ICD-10-CM | POA: Diagnosis present

## 2011-09-30 DIAGNOSIS — I120 Hypertensive chronic kidney disease with stage 5 chronic kidney disease or end stage renal disease: Secondary | ICD-10-CM | POA: Diagnosis not present

## 2011-09-30 DIAGNOSIS — Z940 Kidney transplant status: Secondary | ICD-10-CM

## 2011-09-30 DIAGNOSIS — Z681 Body mass index (BMI) 19 or less, adult: Secondary | ICD-10-CM | POA: Diagnosis not present

## 2011-09-30 DIAGNOSIS — Z01818 Encounter for other preprocedural examination: Secondary | ICD-10-CM | POA: Diagnosis not present

## 2011-09-30 DIAGNOSIS — N920 Excessive and frequent menstruation with regular cycle: Secondary | ICD-10-CM | POA: Insufficient documentation

## 2011-09-30 DIAGNOSIS — Z79899 Other long term (current) drug therapy: Secondary | ICD-10-CM | POA: Diagnosis not present

## 2011-09-30 DIAGNOSIS — J9600 Acute respiratory failure, unspecified whether with hypoxia or hypercapnia: Secondary | ICD-10-CM | POA: Diagnosis not present

## 2011-09-30 DIAGNOSIS — D509 Iron deficiency anemia, unspecified: Secondary | ICD-10-CM | POA: Diagnosis not present

## 2011-09-30 DIAGNOSIS — J811 Chronic pulmonary edema: Secondary | ICD-10-CM | POA: Diagnosis not present

## 2011-09-30 DIAGNOSIS — N2581 Secondary hyperparathyroidism of renal origin: Secondary | ICD-10-CM | POA: Diagnosis not present

## 2011-09-30 DIAGNOSIS — D631 Anemia in chronic kidney disease: Secondary | ICD-10-CM | POA: Diagnosis present

## 2011-09-30 DIAGNOSIS — I517 Cardiomegaly: Secondary | ICD-10-CM | POA: Diagnosis not present

## 2011-09-30 DIAGNOSIS — Z781 Physical restraint status: Secondary | ICD-10-CM | POA: Diagnosis not present

## 2011-09-30 HISTORY — DX: Kidney transplant status: Z94.0

## 2011-09-30 HISTORY — PX: KIDNEY TRANSPLANT: SHX239

## 2011-10-01 DIAGNOSIS — Z940 Kidney transplant status: Secondary | ICD-10-CM | POA: Diagnosis not present

## 2011-10-01 DIAGNOSIS — J9600 Acute respiratory failure, unspecified whether with hypoxia or hypercapnia: Secondary | ICD-10-CM | POA: Diagnosis not present

## 2011-10-01 DIAGNOSIS — N186 End stage renal disease: Secondary | ICD-10-CM | POA: Diagnosis not present

## 2011-10-01 DIAGNOSIS — Z79899 Other long term (current) drug therapy: Secondary | ICD-10-CM | POA: Diagnosis not present

## 2011-10-02 DIAGNOSIS — N186 End stage renal disease: Secondary | ICD-10-CM | POA: Diagnosis not present

## 2011-10-02 DIAGNOSIS — Z79899 Other long term (current) drug therapy: Secondary | ICD-10-CM | POA: Diagnosis not present

## 2011-10-03 DIAGNOSIS — Z79899 Other long term (current) drug therapy: Secondary | ICD-10-CM | POA: Diagnosis not present

## 2011-10-04 DIAGNOSIS — Z79899 Other long term (current) drug therapy: Secondary | ICD-10-CM | POA: Diagnosis not present

## 2011-10-05 DIAGNOSIS — Z79899 Other long term (current) drug therapy: Secondary | ICD-10-CM | POA: Diagnosis not present

## 2011-10-08 DIAGNOSIS — N186 End stage renal disease: Secondary | ICD-10-CM | POA: Diagnosis not present

## 2011-10-08 DIAGNOSIS — Z940 Kidney transplant status: Secondary | ICD-10-CM | POA: Diagnosis not present

## 2011-10-08 DIAGNOSIS — N2581 Secondary hyperparathyroidism of renal origin: Secondary | ICD-10-CM | POA: Diagnosis not present

## 2011-10-08 DIAGNOSIS — Z48298 Encounter for aftercare following other organ transplant: Secondary | ICD-10-CM | POA: Diagnosis not present

## 2011-10-08 DIAGNOSIS — E876 Hypokalemia: Secondary | ICD-10-CM | POA: Diagnosis not present

## 2011-10-08 DIAGNOSIS — I10 Essential (primary) hypertension: Secondary | ICD-10-CM | POA: Diagnosis not present

## 2011-10-08 DIAGNOSIS — R109 Unspecified abdominal pain: Secondary | ICD-10-CM | POA: Diagnosis not present

## 2011-10-08 DIAGNOSIS — I158 Other secondary hypertension: Secondary | ICD-10-CM | POA: Diagnosis not present

## 2011-10-08 DIAGNOSIS — Z79899 Other long term (current) drug therapy: Secondary | ICD-10-CM | POA: Diagnosis not present

## 2011-10-12 DIAGNOSIS — Z48298 Encounter for aftercare following other organ transplant: Secondary | ICD-10-CM | POA: Diagnosis not present

## 2011-10-12 DIAGNOSIS — I158 Other secondary hypertension: Secondary | ICD-10-CM | POA: Diagnosis not present

## 2011-10-12 DIAGNOSIS — Z79899 Other long term (current) drug therapy: Secondary | ICD-10-CM | POA: Diagnosis not present

## 2011-10-12 DIAGNOSIS — Z940 Kidney transplant status: Secondary | ICD-10-CM | POA: Diagnosis not present

## 2011-10-15 DIAGNOSIS — Z940 Kidney transplant status: Secondary | ICD-10-CM | POA: Diagnosis not present

## 2011-10-15 DIAGNOSIS — Z79899 Other long term (current) drug therapy: Secondary | ICD-10-CM | POA: Diagnosis not present

## 2011-10-15 DIAGNOSIS — R300 Dysuria: Secondary | ICD-10-CM | POA: Diagnosis not present

## 2011-10-15 DIAGNOSIS — I10 Essential (primary) hypertension: Secondary | ICD-10-CM | POA: Diagnosis not present

## 2011-10-19 DIAGNOSIS — N186 End stage renal disease: Secondary | ICD-10-CM | POA: Diagnosis not present

## 2011-10-19 DIAGNOSIS — Z940 Kidney transplant status: Secondary | ICD-10-CM | POA: Diagnosis not present

## 2011-10-19 DIAGNOSIS — K219 Gastro-esophageal reflux disease without esophagitis: Secondary | ICD-10-CM | POA: Diagnosis not present

## 2011-10-19 DIAGNOSIS — Z79899 Other long term (current) drug therapy: Secondary | ICD-10-CM | POA: Diagnosis not present

## 2011-10-19 DIAGNOSIS — Z48298 Encounter for aftercare following other organ transplant: Secondary | ICD-10-CM | POA: Diagnosis not present

## 2011-10-19 DIAGNOSIS — R300 Dysuria: Secondary | ICD-10-CM | POA: Diagnosis not present

## 2011-10-19 DIAGNOSIS — I10 Essential (primary) hypertension: Secondary | ICD-10-CM | POA: Diagnosis not present

## 2011-10-19 DIAGNOSIS — I158 Other secondary hypertension: Secondary | ICD-10-CM | POA: Diagnosis not present

## 2011-10-19 DIAGNOSIS — N2581 Secondary hyperparathyroidism of renal origin: Secondary | ICD-10-CM | POA: Diagnosis not present

## 2011-10-22 DIAGNOSIS — N2581 Secondary hyperparathyroidism of renal origin: Secondary | ICD-10-CM | POA: Diagnosis not present

## 2011-10-22 DIAGNOSIS — Z940 Kidney transplant status: Secondary | ICD-10-CM | POA: Diagnosis not present

## 2011-10-22 DIAGNOSIS — Z79899 Other long term (current) drug therapy: Secondary | ICD-10-CM | POA: Diagnosis not present

## 2011-10-26 DIAGNOSIS — Z940 Kidney transplant status: Secondary | ICD-10-CM | POA: Diagnosis not present

## 2011-10-26 DIAGNOSIS — N2581 Secondary hyperparathyroidism of renal origin: Secondary | ICD-10-CM | POA: Diagnosis not present

## 2011-10-26 DIAGNOSIS — N390 Urinary tract infection, site not specified: Secondary | ICD-10-CM | POA: Diagnosis not present

## 2011-10-26 DIAGNOSIS — E872 Acidosis: Secondary | ICD-10-CM | POA: Diagnosis not present

## 2011-10-26 DIAGNOSIS — Z79899 Other long term (current) drug therapy: Secondary | ICD-10-CM | POA: Diagnosis not present

## 2011-10-28 DIAGNOSIS — N170 Acute kidney failure with tubular necrosis: Secondary | ICD-10-CM | POA: Diagnosis not present

## 2011-10-28 DIAGNOSIS — Z79899 Other long term (current) drug therapy: Secondary | ICD-10-CM | POA: Diagnosis not present

## 2011-10-28 DIAGNOSIS — Z940 Kidney transplant status: Secondary | ICD-10-CM | POA: Diagnosis not present

## 2011-10-28 DIAGNOSIS — IMO0002 Reserved for concepts with insufficient information to code with codable children: Secondary | ICD-10-CM | POA: Diagnosis not present

## 2011-10-28 DIAGNOSIS — E872 Acidosis: Secondary | ICD-10-CM | POA: Diagnosis not present

## 2011-10-28 DIAGNOSIS — N390 Urinary tract infection, site not specified: Secondary | ICD-10-CM | POA: Diagnosis not present

## 2011-10-28 DIAGNOSIS — Z48298 Encounter for aftercare following other organ transplant: Secondary | ICD-10-CM | POA: Diagnosis not present

## 2011-10-28 DIAGNOSIS — N2581 Secondary hyperparathyroidism of renal origin: Secondary | ICD-10-CM | POA: Diagnosis not present

## 2011-10-28 DIAGNOSIS — I10 Essential (primary) hypertension: Secondary | ICD-10-CM | POA: Diagnosis not present

## 2011-11-06 DIAGNOSIS — R6889 Other general symptoms and signs: Secondary | ICD-10-CM | POA: Diagnosis not present

## 2011-11-06 DIAGNOSIS — R112 Nausea with vomiting, unspecified: Secondary | ICD-10-CM | POA: Diagnosis not present

## 2011-11-06 DIAGNOSIS — N179 Acute kidney failure, unspecified: Secondary | ICD-10-CM | POA: Diagnosis present

## 2011-11-06 DIAGNOSIS — I129 Hypertensive chronic kidney disease with stage 1 through stage 4 chronic kidney disease, or unspecified chronic kidney disease: Secondary | ICD-10-CM | POA: Diagnosis not present

## 2011-11-06 DIAGNOSIS — N039 Chronic nephritic syndrome with unspecified morphologic changes: Secondary | ICD-10-CM | POA: Diagnosis present

## 2011-11-06 DIAGNOSIS — Z940 Kidney transplant status: Secondary | ICD-10-CM | POA: Diagnosis not present

## 2011-11-06 DIAGNOSIS — I10 Essential (primary) hypertension: Secondary | ICD-10-CM | POA: Diagnosis present

## 2011-11-06 DIAGNOSIS — Z79899 Other long term (current) drug therapy: Secondary | ICD-10-CM | POA: Diagnosis not present

## 2011-11-06 DIAGNOSIS — E860 Dehydration: Secondary | ICD-10-CM | POA: Diagnosis present

## 2011-11-06 DIAGNOSIS — R630 Anorexia: Secondary | ICD-10-CM | POA: Diagnosis present

## 2011-11-06 DIAGNOSIS — D631 Anemia in chronic kidney disease: Secondary | ICD-10-CM | POA: Diagnosis present

## 2011-11-06 DIAGNOSIS — N189 Chronic kidney disease, unspecified: Secondary | ICD-10-CM | POA: Diagnosis not present

## 2011-11-06 DIAGNOSIS — N2581 Secondary hyperparathyroidism of renal origin: Secondary | ICD-10-CM | POA: Diagnosis present

## 2011-11-11 DIAGNOSIS — Z940 Kidney transplant status: Secondary | ICD-10-CM | POA: Diagnosis not present

## 2011-11-11 DIAGNOSIS — R197 Diarrhea, unspecified: Secondary | ICD-10-CM | POA: Diagnosis not present

## 2011-11-11 DIAGNOSIS — Z48298 Encounter for aftercare following other organ transplant: Secondary | ICD-10-CM | POA: Diagnosis not present

## 2011-11-11 DIAGNOSIS — Z79899 Other long term (current) drug therapy: Secondary | ICD-10-CM | POA: Diagnosis not present

## 2011-11-11 DIAGNOSIS — N390 Urinary tract infection, site not specified: Secondary | ICD-10-CM | POA: Diagnosis not present

## 2011-11-16 DIAGNOSIS — I120 Hypertensive chronic kidney disease with stage 5 chronic kidney disease or end stage renal disease: Secondary | ICD-10-CM | POA: Diagnosis not present

## 2011-11-16 DIAGNOSIS — I10 Essential (primary) hypertension: Secondary | ICD-10-CM | POA: Diagnosis not present

## 2011-11-16 DIAGNOSIS — N186 End stage renal disease: Secondary | ICD-10-CM | POA: Diagnosis not present

## 2011-11-16 DIAGNOSIS — Z79899 Other long term (current) drug therapy: Secondary | ICD-10-CM | POA: Diagnosis not present

## 2011-11-16 DIAGNOSIS — Z940 Kidney transplant status: Secondary | ICD-10-CM | POA: Diagnosis not present

## 2011-11-17 DIAGNOSIS — Z940 Kidney transplant status: Secondary | ICD-10-CM | POA: Diagnosis not present

## 2011-11-19 DIAGNOSIS — D899 Disorder involving the immune mechanism, unspecified: Secondary | ICD-10-CM | POA: Diagnosis not present

## 2011-11-19 DIAGNOSIS — I10 Essential (primary) hypertension: Secondary | ICD-10-CM | POA: Diagnosis not present

## 2011-11-19 DIAGNOSIS — Z48298 Encounter for aftercare following other organ transplant: Secondary | ICD-10-CM | POA: Diagnosis not present

## 2011-11-19 DIAGNOSIS — Z79899 Other long term (current) drug therapy: Secondary | ICD-10-CM | POA: Diagnosis not present

## 2011-11-19 DIAGNOSIS — Z940 Kidney transplant status: Secondary | ICD-10-CM | POA: Diagnosis not present

## 2011-11-26 DIAGNOSIS — D899 Disorder involving the immune mechanism, unspecified: Secondary | ICD-10-CM | POA: Diagnosis not present

## 2011-11-26 DIAGNOSIS — Z940 Kidney transplant status: Secondary | ICD-10-CM | POA: Diagnosis not present

## 2011-11-26 DIAGNOSIS — N189 Chronic kidney disease, unspecified: Secondary | ICD-10-CM | POA: Diagnosis not present

## 2011-11-26 DIAGNOSIS — I129 Hypertensive chronic kidney disease with stage 1 through stage 4 chronic kidney disease, or unspecified chronic kidney disease: Secondary | ICD-10-CM | POA: Diagnosis not present

## 2011-11-26 DIAGNOSIS — N2581 Secondary hyperparathyroidism of renal origin: Secondary | ICD-10-CM | POA: Diagnosis not present

## 2011-11-26 DIAGNOSIS — Z79899 Other long term (current) drug therapy: Secondary | ICD-10-CM | POA: Diagnosis not present

## 2011-12-01 DIAGNOSIS — Z940 Kidney transplant status: Secondary | ICD-10-CM | POA: Diagnosis not present

## 2011-12-10 DIAGNOSIS — N189 Chronic kidney disease, unspecified: Secondary | ICD-10-CM | POA: Diagnosis not present

## 2011-12-10 DIAGNOSIS — T8610 Unspecified complication of kidney transplant: Secondary | ICD-10-CM | POA: Diagnosis not present

## 2011-12-10 DIAGNOSIS — Z940 Kidney transplant status: Secondary | ICD-10-CM | POA: Diagnosis not present

## 2011-12-10 DIAGNOSIS — Z79899 Other long term (current) drug therapy: Secondary | ICD-10-CM | POA: Diagnosis not present

## 2011-12-10 DIAGNOSIS — D709 Neutropenia, unspecified: Secondary | ICD-10-CM | POA: Diagnosis not present

## 2011-12-10 DIAGNOSIS — I129 Hypertensive chronic kidney disease with stage 1 through stage 4 chronic kidney disease, or unspecified chronic kidney disease: Secondary | ICD-10-CM | POA: Diagnosis not present

## 2011-12-15 DIAGNOSIS — R630 Anorexia: Secondary | ICD-10-CM | POA: Diagnosis not present

## 2011-12-15 DIAGNOSIS — N390 Urinary tract infection, site not specified: Secondary | ICD-10-CM | POA: Diagnosis not present

## 2011-12-15 DIAGNOSIS — E872 Acidosis: Secondary | ICD-10-CM | POA: Diagnosis not present

## 2011-12-15 DIAGNOSIS — N189 Chronic kidney disease, unspecified: Secondary | ICD-10-CM | POA: Diagnosis not present

## 2011-12-15 DIAGNOSIS — Z940 Kidney transplant status: Secondary | ICD-10-CM | POA: Diagnosis not present

## 2011-12-15 DIAGNOSIS — I129 Hypertensive chronic kidney disease with stage 1 through stage 4 chronic kidney disease, or unspecified chronic kidney disease: Secondary | ICD-10-CM | POA: Diagnosis not present

## 2011-12-15 DIAGNOSIS — N2581 Secondary hyperparathyroidism of renal origin: Secondary | ICD-10-CM | POA: Diagnosis not present

## 2011-12-15 DIAGNOSIS — T8610 Unspecified complication of kidney transplant: Secondary | ICD-10-CM | POA: Diagnosis not present

## 2011-12-15 DIAGNOSIS — Z79899 Other long term (current) drug therapy: Secondary | ICD-10-CM | POA: Diagnosis not present

## 2011-12-21 DIAGNOSIS — D72819 Decreased white blood cell count, unspecified: Secondary | ICD-10-CM | POA: Diagnosis not present

## 2011-12-21 DIAGNOSIS — N189 Chronic kidney disease, unspecified: Secondary | ICD-10-CM | POA: Diagnosis not present

## 2011-12-21 DIAGNOSIS — N2581 Secondary hyperparathyroidism of renal origin: Secondary | ICD-10-CM | POA: Diagnosis not present

## 2011-12-21 DIAGNOSIS — Z940 Kidney transplant status: Secondary | ICD-10-CM | POA: Diagnosis not present

## 2011-12-21 DIAGNOSIS — Z48298 Encounter for aftercare following other organ transplant: Secondary | ICD-10-CM | POA: Diagnosis not present

## 2011-12-21 DIAGNOSIS — Z79899 Other long term (current) drug therapy: Secondary | ICD-10-CM | POA: Diagnosis not present

## 2011-12-21 DIAGNOSIS — I10 Essential (primary) hypertension: Secondary | ICD-10-CM | POA: Diagnosis not present

## 2011-12-21 DIAGNOSIS — I129 Hypertensive chronic kidney disease with stage 1 through stage 4 chronic kidney disease, or unspecified chronic kidney disease: Secondary | ICD-10-CM | POA: Diagnosis not present

## 2011-12-28 ENCOUNTER — Ambulatory Visit (INDEPENDENT_AMBULATORY_CARE_PROVIDER_SITE_OTHER): Payer: Medicare Other | Admitting: Family Medicine

## 2011-12-28 ENCOUNTER — Other Ambulatory Visit (HOSPITAL_COMMUNITY)
Admission: RE | Admit: 2011-12-28 | Discharge: 2011-12-28 | Disposition: A | Discharge status: Home / Self Care | Payer: Medicare Other | Source: Ambulatory Visit | Attending: Family Medicine | Admitting: Family Medicine

## 2011-12-28 VITALS — BP 114/75 | HR 92 | Temp 98.3°F | Ht 67.0 in | Wt 123.0 lb

## 2011-12-28 DIAGNOSIS — H6120 Impacted cerumen, unspecified ear: Secondary | ICD-10-CM

## 2011-12-28 DIAGNOSIS — Z940 Kidney transplant status: Secondary | ICD-10-CM | POA: Diagnosis not present

## 2011-12-28 DIAGNOSIS — A499 Bacterial infection, unspecified: Secondary | ICD-10-CM

## 2011-12-28 DIAGNOSIS — Z48298 Encounter for aftercare following other organ transplant: Secondary | ICD-10-CM | POA: Diagnosis not present

## 2011-12-28 DIAGNOSIS — Z113 Encounter for screening for infections with a predominantly sexual mode of transmission: Secondary | ICD-10-CM | POA: Insufficient documentation

## 2011-12-28 DIAGNOSIS — B9689 Other specified bacterial agents as the cause of diseases classified elsewhere: Secondary | ICD-10-CM

## 2011-12-28 DIAGNOSIS — R300 Dysuria: Secondary | ICD-10-CM | POA: Diagnosis not present

## 2011-12-28 DIAGNOSIS — I10 Essential (primary) hypertension: Secondary | ICD-10-CM | POA: Diagnosis not present

## 2011-12-28 DIAGNOSIS — I129 Hypertensive chronic kidney disease with stage 1 through stage 4 chronic kidney disease, or unspecified chronic kidney disease: Secondary | ICD-10-CM | POA: Diagnosis not present

## 2011-12-28 DIAGNOSIS — Z79899 Other long term (current) drug therapy: Secondary | ICD-10-CM | POA: Diagnosis not present

## 2011-12-28 DIAGNOSIS — D72819 Decreased white blood cell count, unspecified: Secondary | ICD-10-CM | POA: Diagnosis not present

## 2011-12-28 DIAGNOSIS — N2581 Secondary hyperparathyroidism of renal origin: Secondary | ICD-10-CM | POA: Diagnosis not present

## 2011-12-28 DIAGNOSIS — N189 Chronic kidney disease, unspecified: Secondary | ICD-10-CM | POA: Diagnosis not present

## 2011-12-28 DIAGNOSIS — N760 Acute vaginitis: Secondary | ICD-10-CM | POA: Diagnosis not present

## 2011-12-28 DIAGNOSIS — N898 Other specified noninflammatory disorders of vagina: Secondary | ICD-10-CM | POA: Diagnosis not present

## 2011-12-28 DIAGNOSIS — N390 Urinary tract infection, site not specified: Secondary | ICD-10-CM | POA: Diagnosis not present

## 2011-12-28 LAB — POCT URINALYSIS DIPSTICK
Bilirubin, UA: NEGATIVE
Blood, UA: NEGATIVE
Glucose, UA: NEGATIVE
Ketones, UA: NEGATIVE
Leukocytes, UA: NEGATIVE
Nitrite, UA: NEGATIVE
Protein, UA: 30
Spec Grav, UA: 1.02
Urobilinogen, UA: 0.2
pH, UA: 5.5

## 2011-12-28 LAB — POCT WET PREP (WET MOUNT)

## 2011-12-28 LAB — POCT UA - MICROSCOPIC ONLY

## 2011-12-28 NOTE — Progress Notes (Signed)
Subjective:     Patient ID: Candace Griffin, female   DOB: 05/26/1976, 36 y.o.   MRN: [id number]  HPI S/p kidney transplant 3 mo ago. On Prograf, myfolic, valacyclovir, bactrim, PPI/H2 blocker (unknown kind), prednisone since.   1. >3 mo of white/yellow vaginal discharge. States that it is more so than any d/c she's had. Described as thin, odorless. No itchiness or pain. Has not had intercourse in >6 mo. LMP was March 19th. Her nephrologist prescribed her an unknown medication - she took this BID for 7 days - no relief of symptoms.  2. R ear decreased hearing for 2 weeks due to buildup of ear wax. Has excessive ear wax buildup in the past - was seen here 2 years ago for disimpaction, which could not sufficiently address wax. She scratched inside her ear 2.5 weeks ago, and had some bleeding/irritation, now resolved. Has tried many OTC ear drops with no relief. She regularly uses QTips to remove wax. Requests ENT referral.   Review of Systems     Objective:   Physical Exam Gen: well- appearing in nad HEENT: NCAT, TMs could not be visualized due to hard wax. R ear external meatus with erythematous ulcer - Not TTP.  Pelvic exam: no labial lesions, non tender speculum exam. Thin gray discharge. Cervix friable and vascular.    Assessment:      Plan:     1. Vaginal d/c: Was likely empirically treated for BV by nephrologist. Normal POC UA today. Wet prep w GC/Chlamydia today for further evaluation.   2. Ear: Advised against using QTips, as this may exacerbate cerumen impaction. Attempted to clean ears - removed significant amount of wax without relief of impaction. TMs still cannot be visualized. Will refer to ENT.

## 2011-12-28 NOTE — Patient Instructions (Signed)
Will refer you to ENT for ear blockage  You had an abnormal pap in June 2012.  Please make appointment with your PCP for recheck in June  Will send you letter with normal results, otherwise will call you.

## 2011-12-29 DIAGNOSIS — H6120 Impacted cerumen, unspecified ear: Secondary | ICD-10-CM | POA: Insufficient documentation

## 2011-12-29 NOTE — Assessment & Plan Note (Signed)
Unable to clear impaction in bilateral ears with irrigation.  Referred to ENT

## 2011-12-29 NOTE — Assessment & Plan Note (Signed)
No BV or yeast on wet prep.  GC/Chl pending.  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  Advised to return for Pap and further discussion with her PCP.

## 2011-12-29 NOTE — Progress Notes (Signed)
  Subjective:    Patient ID: Candace Griffin, female    DOB: [birth year], 36 y.o.   MRN: [id number]  HPI Discussed and examined patient with [facility].  Agree with note and documentation.  Vaginal discharge:  No odor, clear, constant.  No improved with 7 days course of tx from her nephrologist.  Has history of recurrent BV.  No pain,. Dysuria, abdominal pain.  Hearing loss:  Right ear blocked, sounds muffled.  Irritated after putting qtips.  No fever, drainage.  I have reviewed patient's  PMH, FH, and Social history and Medications as related to this visit. S/p kidney transplant Review of Systems See HPI    Objective:   Physical Exam GEN: Alert & Oriented, No acute distress HEENT: [place]/AT. EOMI, PERRLA, no conjunctival injection or scleral icterus.  Bilateral tympanic membranes blocked. Pelvic Exam:        External: normal female genitalia without lesions or masses        Vagina: normal without lesions or masses        Cervix: normal without lesions or masses        Adnexa: normal bimanual exam without masses or fullness        Uterus: normal by palpation        Samples for Wet prep, GC/Chlamydia obtained        Assessment & Plan:

## 2011-12-30 ENCOUNTER — Encounter: Payer: Self-pay | Admitting: Family Medicine

## 2011-12-30 DIAGNOSIS — Z940 Kidney transplant status: Secondary | ICD-10-CM | POA: Diagnosis not present

## 2011-12-30 DIAGNOSIS — E876 Hypokalemia: Secondary | ICD-10-CM | POA: Diagnosis not present

## 2011-12-30 DIAGNOSIS — IMO0002 Reserved for concepts with insufficient information to code with codable children: Secondary | ICD-10-CM | POA: Diagnosis not present

## 2011-12-30 DIAGNOSIS — Z79899 Other long term (current) drug therapy: Secondary | ICD-10-CM | POA: Diagnosis not present

## 2011-12-30 DIAGNOSIS — M545 Low back pain, unspecified: Secondary | ICD-10-CM | POA: Diagnosis not present

## 2012-01-04 DIAGNOSIS — D649 Anemia, unspecified: Secondary | ICD-10-CM | POA: Diagnosis not present

## 2012-01-04 DIAGNOSIS — Z940 Kidney transplant status: Secondary | ICD-10-CM | POA: Diagnosis not present

## 2012-01-04 DIAGNOSIS — H6120 Impacted cerumen, unspecified ear: Secondary | ICD-10-CM | POA: Diagnosis not present

## 2012-01-04 DIAGNOSIS — N2581 Secondary hyperparathyroidism of renal origin: Secondary | ICD-10-CM | POA: Diagnosis not present

## 2012-01-04 DIAGNOSIS — N185 Chronic kidney disease, stage 5: Secondary | ICD-10-CM | POA: Diagnosis not present

## 2012-01-04 DIAGNOSIS — H902 Conductive hearing loss, unspecified: Secondary | ICD-10-CM | POA: Diagnosis not present

## 2012-01-04 DIAGNOSIS — H60399 Other infective otitis externa, unspecified ear: Secondary | ICD-10-CM | POA: Diagnosis not present

## 2012-01-07 ENCOUNTER — Encounter: Payer: Self-pay | Admitting: Family Medicine

## 2012-01-07 DIAGNOSIS — Z940 Kidney transplant status: Secondary | ICD-10-CM | POA: Insufficient documentation

## 2012-01-11 DIAGNOSIS — Z79899 Other long term (current) drug therapy: Secondary | ICD-10-CM | POA: Diagnosis not present

## 2012-01-11 DIAGNOSIS — Z940 Kidney transplant status: Secondary | ICD-10-CM | POA: Diagnosis not present

## 2012-01-11 DIAGNOSIS — M948X9 Other specified disorders of cartilage, unspecified sites: Secondary | ICD-10-CM | POA: Diagnosis not present

## 2012-01-11 DIAGNOSIS — N183 Chronic kidney disease, stage 3 unspecified: Secondary | ICD-10-CM | POA: Diagnosis not present

## 2012-01-11 DIAGNOSIS — Z48298 Encounter for aftercare following other organ transplant: Secondary | ICD-10-CM | POA: Diagnosis not present

## 2012-01-20 DIAGNOSIS — Z79899 Other long term (current) drug therapy: Secondary | ICD-10-CM | POA: Diagnosis not present

## 2012-01-20 DIAGNOSIS — Z940 Kidney transplant status: Secondary | ICD-10-CM | POA: Diagnosis not present

## 2012-01-20 DIAGNOSIS — Z48298 Encounter for aftercare following other organ transplant: Secondary | ICD-10-CM | POA: Diagnosis not present

## 2012-01-25 DIAGNOSIS — I129 Hypertensive chronic kidney disease with stage 1 through stage 4 chronic kidney disease, or unspecified chronic kidney disease: Secondary | ICD-10-CM | POA: Diagnosis not present

## 2012-01-25 DIAGNOSIS — N2581 Secondary hyperparathyroidism of renal origin: Secondary | ICD-10-CM | POA: Diagnosis not present

## 2012-01-25 DIAGNOSIS — Z48298 Encounter for aftercare following other organ transplant: Secondary | ICD-10-CM | POA: Diagnosis not present

## 2012-01-25 DIAGNOSIS — N189 Chronic kidney disease, unspecified: Secondary | ICD-10-CM | POA: Diagnosis not present

## 2012-01-25 DIAGNOSIS — D72819 Decreased white blood cell count, unspecified: Secondary | ICD-10-CM | POA: Diagnosis not present

## 2012-01-25 DIAGNOSIS — N390 Urinary tract infection, site not specified: Secondary | ICD-10-CM | POA: Diagnosis not present

## 2012-01-25 DIAGNOSIS — I10 Essential (primary) hypertension: Secondary | ICD-10-CM | POA: Diagnosis not present

## 2012-01-25 DIAGNOSIS — Z940 Kidney transplant status: Secondary | ICD-10-CM | POA: Diagnosis not present

## 2012-01-25 DIAGNOSIS — Z79899 Other long term (current) drug therapy: Secondary | ICD-10-CM | POA: Diagnosis not present

## 2012-02-10 DIAGNOSIS — D649 Anemia, unspecified: Secondary | ICD-10-CM | POA: Diagnosis not present

## 2012-02-10 DIAGNOSIS — Z79899 Other long term (current) drug therapy: Secondary | ICD-10-CM | POA: Diagnosis not present

## 2012-02-10 DIAGNOSIS — Z940 Kidney transplant status: Secondary | ICD-10-CM | POA: Diagnosis not present

## 2012-02-26 DIAGNOSIS — Z940 Kidney transplant status: Secondary | ICD-10-CM | POA: Diagnosis not present

## 2012-02-26 DIAGNOSIS — Z79899 Other long term (current) drug therapy: Secondary | ICD-10-CM | POA: Diagnosis not present

## 2012-03-08 DIAGNOSIS — Z940 Kidney transplant status: Secondary | ICD-10-CM | POA: Diagnosis not present

## 2012-03-08 DIAGNOSIS — Z79899 Other long term (current) drug therapy: Secondary | ICD-10-CM | POA: Diagnosis not present

## 2012-03-18 DIAGNOSIS — Z940 Kidney transplant status: Secondary | ICD-10-CM | POA: Diagnosis not present

## 2012-03-18 DIAGNOSIS — Z79899 Other long term (current) drug therapy: Secondary | ICD-10-CM | POA: Diagnosis not present

## 2012-03-22 DIAGNOSIS — E860 Dehydration: Secondary | ICD-10-CM | POA: Diagnosis not present

## 2012-03-22 DIAGNOSIS — I10 Essential (primary) hypertension: Secondary | ICD-10-CM | POA: Diagnosis not present

## 2012-03-22 DIAGNOSIS — E872 Acidosis: Secondary | ICD-10-CM | POA: Diagnosis not present

## 2012-03-22 DIAGNOSIS — Z48298 Encounter for aftercare following other organ transplant: Secondary | ICD-10-CM | POA: Diagnosis not present

## 2012-03-22 DIAGNOSIS — Z79899 Other long term (current) drug therapy: Secondary | ICD-10-CM | POA: Diagnosis not present

## 2012-03-22 DIAGNOSIS — R799 Abnormal finding of blood chemistry, unspecified: Secondary | ICD-10-CM | POA: Diagnosis not present

## 2012-03-22 DIAGNOSIS — I129 Hypertensive chronic kidney disease with stage 1 through stage 4 chronic kidney disease, or unspecified chronic kidney disease: Secondary | ICD-10-CM | POA: Diagnosis not present

## 2012-03-22 DIAGNOSIS — D72819 Decreased white blood cell count, unspecified: Secondary | ICD-10-CM | POA: Diagnosis not present

## 2012-03-22 DIAGNOSIS — N189 Chronic kidney disease, unspecified: Secondary | ICD-10-CM | POA: Diagnosis not present

## 2012-03-22 DIAGNOSIS — R51 Headache: Secondary | ICD-10-CM | POA: Diagnosis not present

## 2012-03-22 DIAGNOSIS — Z940 Kidney transplant status: Secondary | ICD-10-CM | POA: Diagnosis not present

## 2012-03-31 DIAGNOSIS — Z940 Kidney transplant status: Secondary | ICD-10-CM | POA: Diagnosis not present

## 2012-04-01 ENCOUNTER — Ambulatory Visit: Payer: Medicare Other | Admitting: Family Medicine

## 2012-04-21 ENCOUNTER — Encounter: Payer: Self-pay | Admitting: Family Medicine

## 2012-04-21 ENCOUNTER — Other Ambulatory Visit (HOSPITAL_COMMUNITY)
Admission: RE | Admit: 2012-04-21 | Discharge: 2012-04-21 | Disposition: A | Discharge status: Home / Self Care | Payer: Medicare Other | Source: Ambulatory Visit | Attending: Family Medicine | Admitting: Family Medicine

## 2012-04-21 ENCOUNTER — Ambulatory Visit (INDEPENDENT_AMBULATORY_CARE_PROVIDER_SITE_OTHER): Payer: Medicare Other | Admitting: Family Medicine

## 2012-04-21 VITALS — BP 148/93 | HR 82 | Ht 67.0 in | Wt 133.0 lb

## 2012-04-21 DIAGNOSIS — N185 Chronic kidney disease, stage 5: Secondary | ICD-10-CM | POA: Diagnosis not present

## 2012-04-21 DIAGNOSIS — O10019 Pre-existing essential hypertension complicating pregnancy, unspecified trimester: Secondary | ICD-10-CM

## 2012-04-21 DIAGNOSIS — I10 Essential (primary) hypertension: Secondary | ICD-10-CM | POA: Insufficient documentation

## 2012-04-21 DIAGNOSIS — Z940 Kidney transplant status: Secondary | ICD-10-CM | POA: Diagnosis not present

## 2012-04-21 DIAGNOSIS — I120 Hypertensive chronic kidney disease with stage 5 chronic kidney disease or end stage renal disease: Secondary | ICD-10-CM | POA: Diagnosis not present

## 2012-04-21 DIAGNOSIS — Z124 Encounter for screening for malignant neoplasm of cervix: Secondary | ICD-10-CM | POA: Diagnosis not present

## 2012-04-21 DIAGNOSIS — Z01419 Encounter for gynecological examination (general) (routine) without abnormal findings: Secondary | ICD-10-CM | POA: Insufficient documentation

## 2012-04-21 DIAGNOSIS — Z0000 Encounter for general adult medical examination without abnormal findings: Secondary | ICD-10-CM | POA: Diagnosis not present

## 2012-04-21 DIAGNOSIS — D649 Anemia, unspecified: Secondary | ICD-10-CM | POA: Diagnosis not present

## 2012-04-21 NOTE — Progress Notes (Signed)
  Subjective:     Candace Griffin is a 36 y.o. female and is here for a comprehensive physical exam. The patient reports no problems.  HTN: BP is elevate right now, but was normal <1 hr ago at [facility] (124/89). Pt checks at home.  No CP/SOB/vision changes/HA.  S/p kidney transplant 09/2011- on prednisone, doing well.   History   Social History  . Marital Status: Single    Spouse Name: N/A    Number of Children: N/A  . Years of Education: N/A   Occupational History  . Not on file.   Social History Main Topics  . Smoking status: Never Smoker   . Smokeless tobacco: Not on file  . Alcohol Use: No  . Drug Use: Yes  . Sexually Active: Not on file   Other Topics Concern  . Not on file   Social History Narrative  . No narrative on file   Health Maintenance  Topic Date Due  . Tetanus/tdap  10/26/1995  . Influenza Vaccine  05/24/2012  . Pap Smear  02/04/2014    The following portions of the patient's history were reviewed and updated as appropriate: allergies, current medications, past family history, past medical history, past social history, past surgical history and problem list.  Review of Systems Constitutional: negative for chills, fevers and weight loss Ears, nose, mouth, throat, and face: negative for hoarseness, nasal congestion and sore throat Respiratory: negative for cough, dyspnea on exertion and wheezing Cardiovascular: negative for chest pain, chest pressure/discomfort, claudication, exertional chest pressure/discomfort, lower extremity edema, near-syncope, palpitations and syncope Gastrointestinal: negative for abdominal pain, change in bowel habits, constipation, diarrhea and nausea Genitourinary:negative for vaginal discharge and dysuria Musculoskeletal:negative for back pain, bone pain, muscle weakness and myalgias Behavioral/Psych: negative for bad mood, decreased appetite and depression   Objective:    BP 148/93  Pulse 82  Ht 5\' 7"  (1.702 m)  Wt 133 lb  (60.328 kg)  BMI 20.83 kg/m2  LMP 04/15/2012 General appearance: alert, cooperative, appears stated age and no distress Head: Normocephalic, without obvious abnormality, atraumatic Eyes: conjunctivae/corneas clear. PERRL, EOM's intact. Fundi benign. Nose: Nares normal. Septum midline. Mucosa normal. No drainage or sinus tenderness. Throat: lips, mucosa, and tongue normal; teeth and gums normal Neck: no adenopathy, supple, symmetrical, trachea midline and thyroid not enlarged, symmetric, no tenderness/mass/nodules Lungs: clear to auscultation bilaterally Heart: regular rate and rhythm, S1, S2 normal, no murmur, click, rub or gallop Abdomen: soft, non-tender; bowel sounds normal; no masses,  no organomegaly Pelvic: external genitalia normal, no adnexal masses or tenderness, no cervical motion tenderness, positive findings: beefy red ?cobblestoning area around external os, rectovaginal septum normal, uterus normal size, shape, and consistency and vagina normal without discharge Extremities: extremities normal, atraumatic, no cyanosis or edema Skin: Skin color, texture, turgor normal. No rashes or lesions Neurologic: Grossly normal    Assessment:    Healthy female exam. Last PAP 01/2011 showing LGSIL 1.  2 PAPs prior (2010, 2008) were negative/normal.     Plan:     See After Visit Summary for Counseling Recommendations

## 2012-04-21 NOTE — Assessment & Plan Note (Signed)
F/u PAP, LGSIL last year and abnormal area on cervical exam today. Pt on prednisone and immunosuppressants since s/p kidney transplant 09/2011.

## 2012-04-21 NOTE — Assessment & Plan Note (Signed)
Elevated today in office but was just at [place] prior to being here and it was 124/89. Checks at home, is usually 120s/70s-80s. No med changes. Advised to f/u if starts being elevated on home checks. Continue labetolol 200 BID.

## 2012-04-21 NOTE — Patient Instructions (Signed)
  It was nice to meet you today. I will send you a letter with the results of your PAP smear.  Come back in 1 year, sooner for any issues or problems.

## 2012-04-26 DIAGNOSIS — Z940 Kidney transplant status: Secondary | ICD-10-CM | POA: Diagnosis not present

## 2012-04-26 DIAGNOSIS — Z79899 Other long term (current) drug therapy: Secondary | ICD-10-CM | POA: Diagnosis not present

## 2012-04-28 DIAGNOSIS — Z940 Kidney transplant status: Secondary | ICD-10-CM | POA: Diagnosis not present

## 2012-04-28 DIAGNOSIS — Z79899 Other long term (current) drug therapy: Secondary | ICD-10-CM | POA: Diagnosis not present

## 2012-04-28 DIAGNOSIS — IMO0002 Reserved for concepts with insufficient information to code with codable children: Secondary | ICD-10-CM | POA: Diagnosis not present

## 2012-04-28 DIAGNOSIS — D72819 Decreased white blood cell count, unspecified: Secondary | ICD-10-CM | POA: Diagnosis not present

## 2012-04-28 DIAGNOSIS — I10 Essential (primary) hypertension: Secondary | ICD-10-CM | POA: Diagnosis not present

## 2012-04-28 DIAGNOSIS — D899 Disorder involving the immune mechanism, unspecified: Secondary | ICD-10-CM | POA: Diagnosis not present

## 2012-04-29 ENCOUNTER — Encounter: Payer: Self-pay | Admitting: Family Medicine

## 2012-05-24 DIAGNOSIS — Z940 Kidney transplant status: Secondary | ICD-10-CM | POA: Diagnosis not present

## 2012-05-24 DIAGNOSIS — Z79899 Other long term (current) drug therapy: Secondary | ICD-10-CM | POA: Diagnosis not present

## 2012-06-01 ENCOUNTER — Ambulatory Visit (INDEPENDENT_AMBULATORY_CARE_PROVIDER_SITE_OTHER): Payer: Medicare Other | Admitting: Family Medicine

## 2012-06-01 ENCOUNTER — Other Ambulatory Visit (HOSPITAL_COMMUNITY)
Admission: RE | Admit: 2012-06-01 | Discharge: 2012-06-01 | Disposition: A | Discharge status: Home / Self Care | Payer: Medicare Other | Source: Ambulatory Visit | Attending: Family Medicine | Admitting: Family Medicine

## 2012-06-01 ENCOUNTER — Encounter: Payer: Self-pay | Admitting: Family Medicine

## 2012-06-01 VITALS — BP 148/102 | HR 90 | Temp 98.8°F | Ht 67.0 in | Wt 141.0 lb

## 2012-06-01 DIAGNOSIS — Z113 Encounter for screening for infections with a predominantly sexual mode of transmission: Secondary | ICD-10-CM | POA: Diagnosis not present

## 2012-06-01 DIAGNOSIS — A499 Bacterial infection, unspecified: Secondary | ICD-10-CM

## 2012-06-01 DIAGNOSIS — N760 Acute vaginitis: Secondary | ICD-10-CM

## 2012-06-01 DIAGNOSIS — B9689 Other specified bacterial agents as the cause of diseases classified elsewhere: Secondary | ICD-10-CM

## 2012-06-01 LAB — POCT WET PREP (WET MOUNT)
Clue Cells Wet Prep Whiff POC: POSITIVE
WBC, Wet Prep HPF POC: >20

## 2012-06-01 MED ORDER — METRONIDAZOLE 500 MG PO TABS: 500.0000 mg | ORAL_TABLET | Freq: Two times a day (BID) | ORAL | Status: DC

## 2012-06-01 MED ORDER — METRONIDAZOLE 0.75 % VA GEL: 1.0000 | Freq: Two times a day (BID) | VAGINAL | Status: DC

## 2012-06-01 NOTE — Assessment & Plan Note (Signed)
Exam and wet prep consistent with BV- Will treat with vaginal flagyl as patient has hx of recent renal transplant on immunosuppressive medications, would prefer non-systemic treatment.

## 2012-06-01 NOTE — Progress Notes (Signed)
  Subjective:    Patient ID: Myia Kegel, female    DOB: 1976/07/01, 36 y.o.   MRN: [id number]  HPI  Shanterrica comes in with vaginal discharge with odor x 3 days.  She says that it is not itchy like a yeast infection, but she recently had antibiotics for a dental procedure.  She denies any new sexual contacts, says she is monogamous with the same partner for 3 years.  No dysuria, no fevers, no nausea/vomiting.   Review of Systems    See HPI. Objective:   Physical Exam  BP 148/102  Pulse 90  Temp 98.8 F (37.1 C) (Oral)  Ht 5\' 7"  (1.702 m)  Wt 141 lb (63.957 kg)  BMI 22.08 kg/m2  LMP 05/14/2012 General appearance: alert, cooperative and no distress Pelvic: cervix normal in appearance, external genitalia normal, no adnexal masses or tenderness, no cervical motion tenderness, rectovaginal septum normal, uterus normal size, shape, and consistency and vagina normal with scant blood and grey discharge with odor.       Assessment & Plan:

## 2012-06-01 NOTE — Patient Instructions (Signed)
Bacterial Vaginosis  Bacterial vaginosis is an infection of the vagina. A healthy vagina has many kinds of good germs (bacteria). Sometimes the number of good germs can change. This allows bad germs to move in and cause an infection. You may be given medicine (antibiotics) to treat the infection. Or, you may not need treatment at all.  HOME CARE  · Take your medicine as told. Finish them even if you start to feel better.  · Do not have sex until you finish your medicine.  · Do not douche.  · Practice safe sex.  · Tell your sex partner that you have an infection. They should see their doctor for treatment if they have problems.  GET HELP RIGHT AWAY IF:  · You do not get better after 3 days of treatment.  · You have grey fluid (discharge) coming from your vagina.  · You have pain.  · You have a temperature of 102° F (38.9° C) or higher.  MAKE SURE YOU:    · Understand these instructions.  · Will watch your condition.  · Will get help right away if you are not doing well or get worse.  Document Released: 05/19/2008 Document Revised: 11/02/2011 Document Reviewed: 05/19/2008  ExitCare® Patient Information ©2013 [employer].

## 2012-06-06 DIAGNOSIS — Z940 Kidney transplant status: Secondary | ICD-10-CM | POA: Diagnosis not present

## 2012-06-06 DIAGNOSIS — Z79899 Other long term (current) drug therapy: Secondary | ICD-10-CM | POA: Diagnosis not present

## 2012-06-13 ENCOUNTER — Encounter: Payer: Self-pay | Admitting: Family Medicine

## 2012-06-23 DIAGNOSIS — Z79899 Other long term (current) drug therapy: Secondary | ICD-10-CM | POA: Diagnosis not present

## 2012-06-23 DIAGNOSIS — Z940 Kidney transplant status: Secondary | ICD-10-CM | POA: Diagnosis not present

## 2012-07-07 DIAGNOSIS — Z940 Kidney transplant status: Secondary | ICD-10-CM | POA: Diagnosis not present

## 2012-07-07 DIAGNOSIS — Z79899 Other long term (current) drug therapy: Secondary | ICD-10-CM | POA: Diagnosis not present

## 2012-07-20 DIAGNOSIS — D649 Anemia, unspecified: Secondary | ICD-10-CM | POA: Diagnosis not present

## 2012-07-20 DIAGNOSIS — T5691XA Toxic effect of unspecified metal, accidental (unintentional), initial encounter: Secondary | ICD-10-CM | POA: Diagnosis not present

## 2012-07-20 DIAGNOSIS — I120 Hypertensive chronic kidney disease with stage 5 chronic kidney disease or end stage renal disease: Secondary | ICD-10-CM | POA: Diagnosis not present

## 2012-07-20 DIAGNOSIS — N185 Chronic kidney disease, stage 5: Secondary | ICD-10-CM | POA: Diagnosis not present

## 2012-07-24 ENCOUNTER — Inpatient Hospital Stay (HOSPITAL_COMMUNITY)
Admission: AD | Admit: 2012-07-24 | Discharge: 2012-07-24 | Disposition: A | Discharge status: Home / Self Care | Payer: Medicare Other | Source: Ambulatory Visit | Attending: Obstetrics & Gynecology | Admitting: Obstetrics & Gynecology

## 2012-07-24 ENCOUNTER — Encounter (HOSPITAL_COMMUNITY): Payer: Self-pay | Admitting: Obstetrics and Gynecology

## 2012-07-24 DIAGNOSIS — N949 Unspecified condition associated with female genital organs and menstrual cycle: Secondary | ICD-10-CM | POA: Insufficient documentation

## 2012-07-24 DIAGNOSIS — N760 Acute vaginitis: Secondary | ICD-10-CM | POA: Diagnosis not present

## 2012-07-24 DIAGNOSIS — N898 Other specified noninflammatory disorders of vagina: Secondary | ICD-10-CM

## 2012-07-24 DIAGNOSIS — M545 Low back pain, unspecified: Secondary | ICD-10-CM | POA: Insufficient documentation

## 2012-07-24 DIAGNOSIS — A499 Bacterial infection, unspecified: Secondary | ICD-10-CM | POA: Diagnosis not present

## 2012-07-24 DIAGNOSIS — B9689 Other specified bacterial agents as the cause of diseases classified elsewhere: Secondary | ICD-10-CM | POA: Insufficient documentation

## 2012-07-24 LAB — WET PREP, GENITAL
Trich, Wet Prep: NONE SEEN
Yeast Wet Prep HPF POC: NONE SEEN

## 2012-07-24 LAB — URINALYSIS, ROUTINE W REFLEX MICROSCOPIC
Bilirubin Urine: NEGATIVE
Glucose, UA: NEGATIVE mg/dL
Hgb urine dipstick: NEGATIVE
Ketones, ur: NEGATIVE mg/dL
Nitrite: NEGATIVE
Protein, ur: NEGATIVE mg/dL
Specific Gravity, Urine: 1.01 (ref 1.005–1.030)
Urobilinogen, UA: 0.2 mg/dL (ref 0.0–1.0)
pH: 6 (ref 5.0–8.0)

## 2012-07-24 LAB — URINE MICROSCOPIC-ADD ON

## 2012-07-24 LAB — POCT PREGNANCY, URINE: Preg Test, Ur: NEGATIVE

## 2012-07-24 MED ORDER — REPHRESH VA GEL: 1.0000 | Freq: Once | VAGINAL | Status: DC

## 2012-07-24 MED ORDER — METRONIDAZOLE 1 % EX GEL: Freq: Every day | CUTANEOUS | Status: DC

## 2012-07-24 NOTE — MAU Note (Signed)
Pt reports having greenish yellow vaginal discharge on We night. Having lower back pain with it as well.

## 2012-07-24 NOTE — MAU Provider Note (Signed)
History     CSN: [id number]  Arrival date and time: 07/24/12 1525   First Provider Initiated Contact with Patient 07/24/12 1614      Chief Complaint  Patient presents with  . Vaginal Discharge   HPI This is a 36 y.o. female who presents with c/o vaginal discharge with odor. Has had frequent bouts of BV.  Also c/o low back pain for a day or so. Has not taken any med for it, as she doesn't like to take meds other than her Prograf (for recent kidney transplant).  No weakness or numbness in legs. Has been pick up her 36 yr old.  Nothing exacerbates pain.   OB History    Grav Para Term Preterm Abortions TAB SAB Ect Mult Living   3         3      Past Medical History  Diagnosis Date  . Dialysis patient   . Hypertension   . History of renal transplant 07-Oct-2011    Past Surgical History  Procedure Date  . Diaylsis shunt   . Kidney transplant October 07, 2011    deceased donor kidney    Family History  Problem Relation Age of Onset  . Diabetes Father   . Diabetes Paternal Aunt   . Alcohol abuse Paternal Uncle   . Cancer Maternal Grandmother     breast  . Diabetes Paternal Grandmother   . Alcohol abuse Paternal Grandmother   . Alcohol abuse Paternal Grandfather     History  Substance Use Topics  . Smoking status: Never Smoker   . Smokeless tobacco: Not on file  . Alcohol Use: No    Allergies: No Known Allergies  Prescriptions prior to admission  Medication Sig Dispense Refill  . labetalol (NORMODYNE) 200 MG tablet Take 300 mg by mouth 2 (two) times daily.       . magnesium chloride (SLOW-MAG) 64 MG TBEC Take 2 tablets by mouth 2 (two) times daily.      . mycophenolate (MYFORTIC) 180 MG EC tablet Take 180 mg by mouth 2 (two) times daily.      Marland Kitchen omeprazole (PRILOSEC) 20 MG capsule       . predniSONE (DELTASONE) 5 MG tablet Take 5 mg by mouth daily.      Marland Kitchen sulfamethoxazole-trimethoprim (BACTRIM,SEPTRA) 400-80 MG per tablet       . tacrolimus (PROGRAF) 0.5 MG capsule Take 0.5  mg by mouth daily.      . tacrolimus (PROGRAF) 1 MG capsule Take 1 mg by mouth at bedtime.       Marland Kitchen amoxicillin (AMOXIL) 500 MG capsule         ROS See HPI  Physical Exam   Blood pressure 112/76, pulse 92, temperature 98.2 F (36.8 C), temperature source Oral, resp. rate 18, height 5\' 7"  (1.702 m), weight 152 lb 12.8 oz (69.31 kg), last menstrual period 07/07/2012.  Physical Exam  Constitutional: She is oriented to person, place, and time. She appears well-developed and well-nourished. No distress.  HENT:  Head: Normocephalic.  Cardiovascular: Normal rate.        Shunt present in Right forearm  Respiratory: Effort normal.  GI: Soft. She exhibits no distension and no mass. There is no tenderness. There is no rebound and no guarding.  Genitourinary: Uterus normal. Vaginal discharge (scant white ) found.  Musculoskeletal: Normal range of motion.  Neurological: She is alert and oriented to person, place, and time.  Skin: Skin is warm and dry.  Psychiatric: She has  a normal mood and affect.   Results for orders placed during the hospital encounter of 07/24/12 (from the past 24 hour(s))  URINALYSIS, ROUTINE W REFLEX MICROSCOPIC     Status: Abnormal   Collection Time   07/24/12  3:50 PM      Component Value Range   Color, Urine YELLOW  YELLOW   APPearance CLEAR  CLEAR   Specific Gravity, Urine 1.010  1.005 - 1.030   pH 6.0  5.0 - 8.0   Glucose, UA NEGATIVE  NEGATIVE mg/dL   Hgb urine dipstick NEGATIVE  NEGATIVE   Bilirubin Urine NEGATIVE  NEGATIVE   Ketones, ur NEGATIVE  NEGATIVE mg/dL   Protein, ur NEGATIVE  NEGATIVE mg/dL   Urobilinogen, UA 0.2  0.0 - 1.0 mg/dL   Nitrite NEGATIVE  NEGATIVE   Leukocytes, UA SMALL (*) NEGATIVE  URINE MICROSCOPIC-ADD ON     Status: Abnormal   Collection Time   07/24/12  3:50 PM      Component Value Range   Squamous Epithelial / LPF FEW (*) RARE   WBC, UA 3-6  <3 WBC/hpf   RBC / HPF 0-2  <3 RBC/hpf   Bacteria, UA FEW (*) RARE  POCT PREGNANCY,  URINE     Status: Normal   Collection Time   07/24/12  4:16 PM      Component Value Range   Preg Test, Ur NEGATIVE  NEGATIVE    MAU Course  Procedures GC/Chlamydia done per patient's request  Assessment and Plan  A:  Vaginal discharge, r/o BV      Low back pain P:  Pt prefers to not use medication for back. Discussed ice/heat. If not improved may need ortho referral      Treat vaginitis as indicated. May need Rx RePhresh  [facility] 07/24/2012, 4:35 PM   Results for orders placed during the hospital encounter of 07/24/12 (from the past 24 hour(s))  URINALYSIS, ROUTINE W REFLEX MICROSCOPIC     Status: Abnormal   Collection Time   07/24/12  3:50 PM      Component Value Range   Color, Urine YELLOW  YELLOW   APPearance CLEAR  CLEAR   Specific Gravity, Urine 1.010  1.005 - 1.030   pH 6.0  5.0 - 8.0   Glucose, UA NEGATIVE  NEGATIVE mg/dL   Hgb urine dipstick NEGATIVE  NEGATIVE   Bilirubin Urine NEGATIVE  NEGATIVE   Ketones, ur NEGATIVE  NEGATIVE mg/dL   Protein, ur NEGATIVE  NEGATIVE mg/dL   Urobilinogen, UA 0.2  0.0 - 1.0 mg/dL   Nitrite NEGATIVE  NEGATIVE   Leukocytes, UA SMALL (*) NEGATIVE  URINE MICROSCOPIC-ADD ON     Status: Abnormal   Collection Time   07/24/12  3:50 PM      Component Value Range   Squamous Epithelial / LPF FEW (*) RARE   WBC, UA 3-6  <3 WBC/hpf   RBC / HPF 0-2  <3 RBC/hpf   Bacteria, UA FEW (*) RARE  POCT PREGNANCY, URINE     Status: Normal   Collection Time   07/24/12  4:16 PM      Component Value Range   Preg Test, Ur NEGATIVE  NEGATIVE  WET PREP, GENITAL     Status: Abnormal   Collection Time   07/24/12  4:22 PM      Component Value Range   Yeast Wet Prep HPF POC NONE SEEN  NONE SEEN   Trich, Wet Prep NONE SEEN  NONE SEEN  Clue Cells Wet Prep HPF POC MODERATE (*) NONE SEEN   WBC, Wet Prep HPF POC MANY (*) NONE SEEN  Dx: 1. Vaginal discharge   2. Bacterial vaginitis   Plan:   Rx: Metrogel and Rephresh F/U with PMD as needed. Lorene Dy, CNM 07/24/2012 5:24 PM

## 2012-07-24 NOTE — MAU Note (Signed)
"  Wednesday night into Thursday morning, I had a vaginal d/c that has a gassy (fecal) odor.  It is yellowish-green.  My lower back started hurting the same time.  I know I can't see my doctor until after tomorrow.  I have had BV before, so I know what that is like."

## 2012-07-25 LAB — URINE CULTURE
Colony Count: NO GROWTH
Culture: NO GROWTH

## 2012-07-26 LAB — GC/CHLAMYDIA PROBE AMP
CT Probe RNA: NEGATIVE
GC Probe RNA: NEGATIVE

## 2012-07-27 NOTE — MAU Provider Note (Signed)
Attestation of Attending Supervision of Advanced Practitioner (CNM/NP): Evaluation and management procedures were performed by the Advanced Practitioner under my supervision and collaboration. I have reviewed the Advanced Practitioner's note and chart, and I agree with the management and plan.  Daishaun Ayre H. 9:07 PM

## 2012-08-05 DIAGNOSIS — Z940 Kidney transplant status: Secondary | ICD-10-CM | POA: Diagnosis not present

## 2012-08-05 DIAGNOSIS — Z79899 Other long term (current) drug therapy: Secondary | ICD-10-CM | POA: Diagnosis not present

## 2012-08-26 ENCOUNTER — Ambulatory Visit: Payer: Medicare Other | Admitting: Family Medicine

## 2012-08-29 ENCOUNTER — Ambulatory Visit: Payer: Medicare Other | Admitting: Family Medicine

## 2012-08-30 ENCOUNTER — Ambulatory Visit: Payer: Medicare Other | Admitting: Family Medicine

## 2012-08-31 ENCOUNTER — Ambulatory Visit: Payer: Medicare Other

## 2012-09-22 DIAGNOSIS — Z79899 Other long term (current) drug therapy: Secondary | ICD-10-CM | POA: Diagnosis not present

## 2012-09-22 DIAGNOSIS — Z940 Kidney transplant status: Secondary | ICD-10-CM | POA: Diagnosis not present

## 2012-10-13 DIAGNOSIS — Z79899 Other long term (current) drug therapy: Secondary | ICD-10-CM | POA: Diagnosis not present

## 2012-10-13 DIAGNOSIS — Z48298 Encounter for aftercare following other organ transplant: Secondary | ICD-10-CM | POA: Diagnosis not present

## 2012-10-13 DIAGNOSIS — Z298 Encounter for other specified prophylactic measures: Secondary | ICD-10-CM | POA: Diagnosis not present

## 2012-10-13 DIAGNOSIS — N2581 Secondary hyperparathyroidism of renal origin: Secondary | ICD-10-CM | POA: Diagnosis not present

## 2012-10-13 DIAGNOSIS — I10 Essential (primary) hypertension: Secondary | ICD-10-CM | POA: Diagnosis not present

## 2012-10-13 DIAGNOSIS — Z792 Long term (current) use of antibiotics: Secondary | ICD-10-CM | POA: Diagnosis not present

## 2012-10-13 DIAGNOSIS — Z940 Kidney transplant status: Secondary | ICD-10-CM | POA: Diagnosis not present

## 2012-11-07 DIAGNOSIS — Z940 Kidney transplant status: Secondary | ICD-10-CM | POA: Diagnosis not present

## 2012-11-07 DIAGNOSIS — Z79899 Other long term (current) drug therapy: Secondary | ICD-10-CM | POA: Diagnosis not present

## 2012-11-14 DIAGNOSIS — I10 Essential (primary) hypertension: Secondary | ICD-10-CM | POA: Diagnosis not present

## 2012-11-14 DIAGNOSIS — N185 Chronic kidney disease, stage 5: Secondary | ICD-10-CM | POA: Diagnosis not present

## 2012-11-14 DIAGNOSIS — Z940 Kidney transplant status: Secondary | ICD-10-CM | POA: Diagnosis not present

## 2012-11-14 DIAGNOSIS — D649 Anemia, unspecified: Secondary | ICD-10-CM | POA: Diagnosis not present

## 2012-11-24 DIAGNOSIS — Z79899 Other long term (current) drug therapy: Secondary | ICD-10-CM | POA: Diagnosis not present

## 2012-11-24 DIAGNOSIS — Z09 Encounter for follow-up examination after completed treatment for conditions other than malignant neoplasm: Secondary | ICD-10-CM | POA: Diagnosis not present

## 2012-11-24 DIAGNOSIS — R791 Abnormal coagulation profile: Secondary | ICD-10-CM | POA: Diagnosis not present

## 2012-11-24 DIAGNOSIS — Z940 Kidney transplant status: Secondary | ICD-10-CM | POA: Diagnosis not present

## 2012-11-24 DIAGNOSIS — Z48298 Encounter for aftercare following other organ transplant: Secondary | ICD-10-CM | POA: Diagnosis not present

## 2012-12-13 ENCOUNTER — Encounter: Payer: Self-pay | Admitting: Family Medicine

## 2012-12-13 ENCOUNTER — Other Ambulatory Visit (HOSPITAL_COMMUNITY)
Admission: RE | Admit: 2012-12-13 | Discharge: 2012-12-13 | Disposition: A | Discharge status: Home / Self Care | Payer: Medicare Other | Source: Ambulatory Visit | Attending: Family Medicine | Admitting: Family Medicine

## 2012-12-13 ENCOUNTER — Ambulatory Visit (INDEPENDENT_AMBULATORY_CARE_PROVIDER_SITE_OTHER): Payer: Medicare Other | Admitting: Family Medicine

## 2012-12-13 VITALS — BP 161/98 | HR 93 | Temp 98.1°F | Ht 67.0 in | Wt 161.0 lb

## 2012-12-13 DIAGNOSIS — N760 Acute vaginitis: Secondary | ICD-10-CM | POA: Diagnosis not present

## 2012-12-13 DIAGNOSIS — B9689 Other specified bacterial agents as the cause of diseases classified elsewhere: Secondary | ICD-10-CM

## 2012-12-13 DIAGNOSIS — Z298 Encounter for other specified prophylactic measures: Secondary | ICD-10-CM | POA: Diagnosis not present

## 2012-12-13 DIAGNOSIS — Z940 Kidney transplant status: Secondary | ICD-10-CM | POA: Diagnosis not present

## 2012-12-13 DIAGNOSIS — I10 Essential (primary) hypertension: Secondary | ICD-10-CM | POA: Diagnosis not present

## 2012-12-13 DIAGNOSIS — A499 Bacterial infection, unspecified: Secondary | ICD-10-CM

## 2012-12-13 DIAGNOSIS — D899 Disorder involving the immune mechanism, unspecified: Secondary | ICD-10-CM | POA: Diagnosis not present

## 2012-12-13 DIAGNOSIS — M25579 Pain in unspecified ankle and joints of unspecified foot: Secondary | ICD-10-CM

## 2012-12-13 DIAGNOSIS — Z792 Long term (current) use of antibiotics: Secondary | ICD-10-CM | POA: Diagnosis not present

## 2012-12-13 DIAGNOSIS — Z79899 Other long term (current) drug therapy: Secondary | ICD-10-CM | POA: Diagnosis not present

## 2012-12-13 DIAGNOSIS — N898 Other specified noninflammatory disorders of vagina: Secondary | ICD-10-CM

## 2012-12-13 DIAGNOSIS — Z113 Encounter for screening for infections with a predominantly sexual mode of transmission: Secondary | ICD-10-CM | POA: Diagnosis not present

## 2012-12-13 DIAGNOSIS — M25571 Pain in right ankle and joints of right foot: Secondary | ICD-10-CM

## 2012-12-13 LAB — POCT WET PREP (WET MOUNT)
Clue Cells Wet Prep Whiff POC: POSITIVE
WBC, Wet Prep HPF POC: >20

## 2012-12-13 MED ORDER — METRONIDAZOLE 0.75 % VA GEL: 1.0000 | Freq: Every day | VAGINAL | Status: AC

## 2012-12-13 NOTE — Assessment & Plan Note (Signed)
.   HTN: Off med today.    Patient encouraged to use BP med as soon as possible,RTC with PMD for reassessment.

## 2012-12-13 NOTE — Assessment & Plan Note (Signed)
.   Right ankle pain: Likely muscle sprain.   Ankle pain improved,may use tylenol prn pain.

## 2012-12-13 NOTE — Assessment & Plan Note (Signed)
Vaginal discharge: recurrent BV. Positive whiff test and clue cell on wet prep.    Metrogel prescribed. I also recommended long term treatment with metrogel due to recurrent nature of her BV,she is instructed to use metrogel every 3 days for the next 4 wks,then f/u with pmd for reassessment.

## 2012-12-13 NOTE — Patient Instructions (Addendum)
Bacterial Vaginosis Bacterial vaginosis (BV) is a vaginal infection where the normal balance of bacteria in the vagina is disrupted. The normal balance is then replaced by an overgrowth of certain bacteria. There are several different kinds of bacteria that can cause BV. BV is the most common vaginal infection in women of childbearing age. CAUSES   The cause of BV is not fully understood. BV develops when there is an increase or imbalance of harmful bacteria.  Some activities or behaviors can upset the normal balance of bacteria in the vagina and put women at increased risk including:  Having a new sex partner or multiple sex partners.  Douching.  Using an intrauterine device (IUD) for contraception.  It is not clear what role sexual activity plays in the development of BV. However, women that have never had sexual intercourse are rarely infected with BV. Women do not get BV from toilet seats, bedding, swimming pools or from touching objects around them.  SYMPTOMS   Grey vaginal discharge.  A fish-like odor with discharge, especially after sexual intercourse.  Itching or burning of the vagina and vulva.  Burning or pain with urination.  Some women have no signs or symptoms at all. DIAGNOSIS  Your caregiver must examine the vagina for signs of BV. Your caregiver will perform lab tests and look at the sample of vaginal fluid through a microscope. They will look for bacteria and abnormal cells (clue cells), a pH test higher than 4.5, and a positive amine test all associated with BV.  RISKS AND COMPLICATIONS   Pelvic inflammatory disease (PID).  Infections following gynecology surgery.  Developing HIV.  Developing herpes virus. TREATMENT  Sometimes BV will clear up without treatment. However, all women with symptoms of BV should be treated to avoid complications, especially if gynecology surgery is planned. Female partners generally do not need to be treated. However, BV may spread  between female sex partners so treatment is helpful in preventing a recurrence of BV.   BV may be treated with antibiotics. The antibiotics come in either pill or vaginal cream forms. Either can be used with nonpregnant or pregnant women, but the recommended dosages differ. These antibiotics are not harmful to the baby.  BV can recur after treatment. If this happens, a second round of antibiotics will often be prescribed.  Treatment is important for pregnant women. If not treated, BV can cause a premature delivery, especially for a pregnant woman who had a premature birth in the past. All pregnant women who have symptoms of BV should be checked and treated.  For chronic reoccurrence of BV, treatment with a type of prescribed gel vaginally twice a week is helpful. HOME CARE INSTRUCTIONS   Finish all medication as directed by your caregiver.  Do not have sex until treatment is completed.  Tell your sexual partner that you have a vaginal infection. They should see their caregiver and be treated if they have problems, such as a mild rash or itching.  Practice safe sex. Use condoms. Only have 1 sex partner. PREVENTION  Basic prevention steps can help reduce the risk of upsetting the natural balance of bacteria in the vagina and developing BV:  Do not have sexual intercourse (be abstinent).  Do not douche.  Use all of the medicine prescribed for treatment of BV, even if the signs and symptoms go away.  Tell your sex partner if you have BV. That way, they can be treated, if needed, to prevent reoccurrence. SEEK MEDICAL CARE IF:     Your symptoms are not improving after 3 days of treatment.  You have increased discharge, pain, or fever. MAKE SURE YOU:   Understand these instructions.  Will watch your condition.  Will get help right away if you are not doing well or get worse. FOR MORE INFORMATION  Division of STD Prevention (DSTDP), Centers for Disease Control and Prevention:  www.cdc.gov/std American [employer] Health Association (ASHA): www.ashastd.org  Document Released: 08/10/2005 Document Revised: 11/02/2011 Document Reviewed: 01/31/2009 Exit[employer] Patient Information 2013 [employer].  

## 2012-12-13 NOTE — Progress Notes (Signed)
Subjective:     Patient ID: Candace Griffin, female   DOB: 1976/02/22, 37 y.o.   MRN: [id number]  HPI Vaginal discharge: Fishy odor discharge since 1 wk ago,last treated about 1 month ago for BV,she has had 5 BV in 3 months. No fever,no urinary symptom. She has been having recurrent BV for many months now she knows what the symptom is like,she things she has it again and will like to get treated. She denies any recent STI,she uses condom regularly,LMP was 12/06/12. She will also like to get GC/CHlamydia checked today. Ankle pain: C/O right foot,last week about 10/10 in severity but since then she had felt better,pain is now 1/10 in severity,worsen with ambulation,she denies any injury,she wears high heel shoes which actually help improve her symptoms. HTN:She is yet to take her BP med today,she denies any headache,no N/V,feels well otherwise.  Past Medical History  Diagnosis Date  . Dialysis patient   . Hypertension   . History of renal transplant 09/30/2011      Review of Systems  Respiratory: Negative.   Cardiovascular: Negative.   Gastrointestinal: Negative.   Genitourinary: Positive for vaginal discharge. Negative for dysuria, urgency, vaginal bleeding and dyspareunia.  All other systems reviewed and are negative.    Filed Vitals:   12/13/12 1123  BP: 161/98  Pulse: 93  Temp: 98.1 F (36.7 C)  TempSrc: Oral  Height: 5\' 7"  (1.702 m)  Weight: 161 lb (73.029 kg)       Objective:   Physical Exam  Nursing note and vitals reviewed. Constitutional: She is oriented to person, place, and time. She appears well-developed. No distress.  Cardiovascular: Normal rate, normal heart sounds and intact distal pulses.   No murmur heard. Pulmonary/Chest: Effort normal and breath sounds normal. No respiratory distress. She has no wheezes.  Abdominal: Soft. Bowel sounds are normal. She exhibits no distension and no mass. There is no tenderness.  Genitourinary: Uterus normal. Pelvic exam was  performed with patient supine. No labial fusion. There is no rash, tenderness, lesion or injury on the right labia. There is no rash, tenderness, lesion or injury on the left labia. Cervix exhibits discharge. Cervix exhibits no motion tenderness. Right adnexum displays no mass and no tenderness. Left adnexum displays no mass and no tenderness. Vaginal discharge found.  Musculoskeletal: Normal range of motion. She exhibits no edema and no tenderness.       Right ankle: Normal.       Left ankle: Normal.       Right foot: Normal.       Left foot: Normal.  Neurological: She is alert and oriented to person, place, and time.       Assessment:     Vaginal discharge: recurrent BV. Positive whiff test and clue cell on wet prep. Right ankle pain: Likely muscle sprain. HTN: Off med today.     Plan:     1.  Metrogel prescribed. I also recommended long term treatment with metrogel due to recurrent nature of her BV,she is instructed to use metrogel every 3 days for the next 4 wks,then f/u with pmd for reassessment.  2. Ankle pain improved,may use tylenol prn pain.  3. Patient encouraged to use BP med as soon as possible,RTC with PMD for reassessment.

## 2012-12-15 ENCOUNTER — Encounter: Payer: Self-pay | Admitting: Family Medicine

## 2012-12-29 DIAGNOSIS — D8989 Other specified disorders involving the immune mechanism, not elsewhere classified: Secondary | ICD-10-CM | POA: Diagnosis not present

## 2012-12-29 DIAGNOSIS — Z940 Kidney transplant status: Secondary | ICD-10-CM | POA: Diagnosis not present

## 2012-12-29 DIAGNOSIS — Z48298 Encounter for aftercare following other organ transplant: Secondary | ICD-10-CM | POA: Diagnosis not present

## 2012-12-29 DIAGNOSIS — IMO0002 Reserved for concepts with insufficient information to code with codable children: Secondary | ICD-10-CM | POA: Diagnosis not present

## 2012-12-29 DIAGNOSIS — Z792 Long term (current) use of antibiotics: Secondary | ICD-10-CM | POA: Diagnosis not present

## 2012-12-29 DIAGNOSIS — R197 Diarrhea, unspecified: Secondary | ICD-10-CM | POA: Diagnosis not present

## 2012-12-29 DIAGNOSIS — Z298 Encounter for other specified prophylactic measures: Secondary | ICD-10-CM | POA: Diagnosis not present

## 2012-12-29 DIAGNOSIS — N186 End stage renal disease: Secondary | ICD-10-CM | POA: Diagnosis not present

## 2012-12-29 DIAGNOSIS — N039 Chronic nephritic syndrome with unspecified morphologic changes: Secondary | ICD-10-CM | POA: Diagnosis not present

## 2013-03-01 ENCOUNTER — Encounter: Payer: Self-pay | Admitting: Emergency Medicine

## 2013-03-01 ENCOUNTER — Ambulatory Visit (INDEPENDENT_AMBULATORY_CARE_PROVIDER_SITE_OTHER): Payer: Medicare Other | Admitting: Emergency Medicine

## 2013-03-01 VITALS — BP 162/103 | HR 90 | Temp 98.1°F | Resp 17 | Ht 67.0 in | Wt 156.3 lb

## 2013-03-01 DIAGNOSIS — R300 Dysuria: Secondary | ICD-10-CM | POA: Insufficient documentation

## 2013-03-01 DIAGNOSIS — Z79899 Other long term (current) drug therapy: Secondary | ICD-10-CM | POA: Diagnosis not present

## 2013-03-01 DIAGNOSIS — N898 Other specified noninflammatory disorders of vagina: Secondary | ICD-10-CM

## 2013-03-01 DIAGNOSIS — N390 Urinary tract infection, site not specified: Secondary | ICD-10-CM

## 2013-03-01 DIAGNOSIS — Z940 Kidney transplant status: Secondary | ICD-10-CM | POA: Diagnosis not present

## 2013-03-01 DIAGNOSIS — I10 Essential (primary) hypertension: Secondary | ICD-10-CM | POA: Diagnosis not present

## 2013-03-01 LAB — POCT WET PREP (WET MOUNT): WBC, Wet Prep HPF POC: >20

## 2013-03-01 LAB — POCT URINALYSIS DIPSTICK
Bilirubin, UA: NEGATIVE
Blood, UA: NEGATIVE
Glucose, UA: NEGATIVE
Ketones, UA: NEGATIVE
Nitrite, UA: NEGATIVE
Protein, UA: 30
Spec Grav, UA: >=1.03
Urobilinogen, UA: 0.2
pH, UA: 5.5

## 2013-03-01 MED ORDER — METRONIDAZOLE 500 MG PO TABS: 500.0000 mg | ORAL_TABLET | Freq: Two times a day (BID) | ORAL | Status: DC

## 2013-03-01 NOTE — Assessment & Plan Note (Signed)
Wet prep shows few clue cells.  Will treat with flagyl 500mg  BID x7 days.

## 2013-03-01 NOTE — Progress Notes (Signed)
  Subjective:    Patient ID: Candace Griffin, female    DOB: [birth year], 37 y.o.   MRN: [id number]  HPI Yowanda Dilbert is here for a SDA for dysuria and vaginal discharge.  1. Dysuria: She reports intermittent dysuria for the last 2-3 days.  Associated with occasional sharp low back pain.  States this feels like prior bladder infections.  Denies any hematuria, fever or chills.  She did receive a renal transplant just over 1 year ago and takes myfortic, prograf and prednisone.  2. Vaginal discharge: This has also been present for a few days.  Describes as creamy yellow-green discharge.  Does have an odor.  No itching.  No new sexual partners.  Partner is not circumcised.    Also reports a squishy, tender spot by her anus.  Reports that she has trouble with constipation and straining.  Gets these every couple of months and they go away after a few days.  Denies any blood in the stool.   I have reviewed and updated the following as appropriate: allergies and current medications SHx: non smoker  Review of Systems See HPI    Objective:   Physical Exam BP 162/103  Pulse 90  Temp(Src) 98.1 F (36.7 C)  Resp 17  Ht 5\' 7"  (1.702 m)  Wt 156 lb 4.8 oz (70.897 kg)  BMI 24.47 kg/m2  SpO2 97% Gen: alert, cooperative, NAD HEENT: AT/[place], sclera white, MMM Neck: supple CV: RRR, no murmurs Pulm: CTAB, no wheezes or rales Abd: +BS, soft, NTND, well-healed scar in RLQ Pelvic: normal external genitalia, normal vagina, small amount of thick yellow discharge present, cervix normal Rectal: external hemorrhoid at 5 o'clock, no active bleeding     Assessment & Plan:

## 2013-03-01 NOTE — Assessment & Plan Note (Signed)
Elevated today.  Has appt with nephrology this month.  Will defer to them for medication adjustment.

## 2013-03-01 NOTE — Assessment & Plan Note (Signed)
UA just with trace LE.  With history of renal transplant will send urine for culture.  Will call pt with the results.  Return to clinic if no improvement in symptoms in the next week or she develops fevers, chills, n/v.

## 2013-03-01 NOTE — Progress Notes (Signed)
Patient complains of having some burning when she voids Some vaginal discharge with light odor past 4 days Lower back pain

## 2013-03-01 NOTE — Patient Instructions (Addendum)
It was nice to meet you! You have bacterial vaginosis. I sent in Flagyl, take 1 pill twice a day for 1 week. Your urine doesn't look too bad.  I sent in a culture and will call you if you need to start antibiotics. Come back in 1 week, if your symptoms are not improved.

## 2013-03-02 LAB — URINE CULTURE
Colony Count: NO GROWTH
Organism ID, Bacteria: NO GROWTH

## 2013-03-08 DIAGNOSIS — Z940 Kidney transplant status: Secondary | ICD-10-CM | POA: Diagnosis not present

## 2013-03-16 DIAGNOSIS — N2581 Secondary hyperparathyroidism of renal origin: Secondary | ICD-10-CM | POA: Diagnosis not present

## 2013-03-16 DIAGNOSIS — D649 Anemia, unspecified: Secondary | ICD-10-CM | POA: Diagnosis not present

## 2013-03-16 DIAGNOSIS — N185 Chronic kidney disease, stage 5: Secondary | ICD-10-CM | POA: Diagnosis not present

## 2013-04-27 ENCOUNTER — Ambulatory Visit: Payer: Medicare Other

## 2013-05-15 ENCOUNTER — Encounter: Payer: Self-pay | Admitting: Family Medicine

## 2013-05-15 ENCOUNTER — Ambulatory Visit (INDEPENDENT_AMBULATORY_CARE_PROVIDER_SITE_OTHER): Payer: Medicare Other | Admitting: Family Medicine

## 2013-05-15 VITALS — BP 151/100 | HR 92 | Ht 67.0 in | Wt 163.8 lb

## 2013-05-15 DIAGNOSIS — E785 Hyperlipidemia, unspecified: Secondary | ICD-10-CM | POA: Diagnosis not present

## 2013-05-15 DIAGNOSIS — N2581 Secondary hyperparathyroidism of renal origin: Secondary | ICD-10-CM | POA: Diagnosis not present

## 2013-05-15 DIAGNOSIS — N898 Other specified noninflammatory disorders of vagina: Secondary | ICD-10-CM | POA: Diagnosis not present

## 2013-05-15 DIAGNOSIS — I10 Essential (primary) hypertension: Secondary | ICD-10-CM | POA: Diagnosis not present

## 2013-05-15 DIAGNOSIS — Z79899 Other long term (current) drug therapy: Secondary | ICD-10-CM | POA: Diagnosis not present

## 2013-05-15 DIAGNOSIS — Z940 Kidney transplant status: Secondary | ICD-10-CM | POA: Diagnosis not present

## 2013-05-15 LAB — POCT WET PREP (WET MOUNT)
Clue Cells Wet Prep Whiff POC: NEGATIVE
WBC, Wet Prep HPF POC: >20

## 2013-05-15 MED ORDER — METRONIDAZOLE 0.75 % VA GEL: 1.0000 | VAGINAL | Status: DC

## 2013-05-15 MED ORDER — METRONIDAZOLE 500 MG PO TABS: 500.0000 mg | ORAL_TABLET | Freq: Two times a day (BID) | ORAL | Status: DC

## 2013-05-15 NOTE — Patient Instructions (Signed)
Thank you for coming in today. You likely have recurrent BV. Please start the metronidazole 500mg  by mouth twice daily for 7 days After you finish the oral medicine use the vaginal metronidazole twice a week for a total of 6 months Please follow up with your regular doctor as needed Have a great day  Bacterial Vaginosis Bacterial vaginosis (BV) is a vaginal infection where the normal balance of bacteria in the vagina is disrupted. The normal balance is then replaced by an overgrowth of certain bacteria. There are several different kinds of bacteria that can cause BV. BV is the most common vaginal infection in women of childbearing age. CAUSES   The cause of BV is not fully understood. BV develops when there is an increase or imbalance of harmful bacteria.  Some activities or behaviors can upset the normal balance of bacteria in the vagina and put women at increased risk including:  Having a new sex partner or multiple sex partners.  Douching.  Using an intrauterine device (IUD) for contraception.  It is not clear what role sexual activity plays in the development of BV. However, women that have never had sexual intercourse are rarely infected with BV. Women do not get BV from toilet seats, bedding, swimming pools or from touching objects around them.  SYMPTOMS   Grey vaginal discharge.  A fish-like odor with discharge, especially after sexual intercourse.  Itching or burning of the vagina and vulva.  Burning or pain with urination.  Some women have no signs or symptoms at all. DIAGNOSIS  Your caregiver must examine the vagina for signs of BV. Your caregiver will perform lab tests and look at the sample of vaginal fluid through a microscope. They will look for bacteria and abnormal cells (clue cells), a pH test higher than 4.5, and a positive amine test all associated with BV.  RISKS AND COMPLICATIONS   Pelvic inflammatory disease (PID).  Infections following gynecology  surgery.  Developing HIV.  Developing herpes virus. TREATMENT  Sometimes BV will clear up without treatment. However, all women with symptoms of BV should be treated to avoid complications, especially if gynecology surgery is planned. Female partners generally do not need to be treated. However, BV may spread between female sex partners so treatment is helpful in preventing a recurrence of BV.   BV may be treated with antibiotics. The antibiotics come in either pill or vaginal cream forms. Either can be used with nonpregnant or pregnant women, but the recommended dosages differ. These antibiotics are not harmful to the baby.  BV can recur after treatment. If this happens, a second round of antibiotics will often be prescribed.  Treatment is important for pregnant women. If not treated, BV can cause a premature delivery, especially for a pregnant woman who had a premature birth in the past. All pregnant women who have symptoms of BV should be checked and treated.  For chronic reoccurrence of BV, treatment with a type of prescribed gel vaginally twice a week is helpful. HOME CARE INSTRUCTIONS   Finish all medication as directed by your caregiver.  Do not have sex until treatment is completed.  Tell your sexual partner that you have a vaginal infection. They should see their caregiver and be treated if they have problems, such as a mild rash or itching.  Practice safe sex. Use condoms. Only have 1 sex partner. PREVENTION  Basic prevention steps can help reduce the risk of upsetting the natural balance of bacteria in the vagina and developing BV:  Do not have sexual intercourse (be abstinent).  Do not douche.  Use all of the medicine prescribed for treatment of BV, even if the signs and symptoms go away.  Tell your sex partner if you have BV. That way, they can be treated, if needed, to prevent reoccurrence. SEEK MEDICAL CARE IF:   Your symptoms are not improving after 3 days of  treatment.  You have increased discharge, pain, or fever. MAKE SURE YOU:   Understand these instructions.  Will watch your condition.  Will get help right away if you are not doing well or get worse. FOR MORE INFORMATION  Division of STD Prevention (DSTDP), Centers for Disease Control and Prevention: [employer] [place] (ASHA): www.ashastd.org  Document Released: 08/10/2005 Document Revised: 11/02/2011 Document Reviewed: 01/31/2009 [facility] Patient Information 2014 [place], [place].

## 2013-05-15 NOTE — Assessment & Plan Note (Signed)
Pt w/ likely BV. Pt performed wet prep sample was negative but based on h/o ~7 BV infections this year w/ similar symptoms again adn on chronic immunosuppresive drugs will treat for chronicBV w/ prolonged Metro course - Metro PO x7 days followed by [employer] vaginal 2x [employer] for 6 mo. - recommend probiotics

## 2013-05-15 NOTE — Progress Notes (Signed)
Candace Griffin is a 37 y.o. female who presents to [facility] today for Vaginal infection  Vaginal DC and smell started 2.5 wks ago. Associated w/ vaginal discomfort. Deneis fevers, Lower abdominal pain, dysuria. Reports chronic BV infections (approximately 7 this year)   HTN: taking labetolol. Denies HA, CP, SOB.   The following portions of the patient's history were reviewed and updated as appropriate: allergies, current medications, past medical history, family and social history, and problem list.  Patient is a nonsmoker.  Past Medical History  Diagnosis Date  . Dialysis patient   . Hypertension   . History of renal transplant 09/30/2011    ROS as above otherwise neg.    Medications reviewed. Current Outpatient Prescriptions  Medication Sig Dispense Refill  . labetalol (NORMODYNE) 200 MG tablet Take 300 mg by mouth 2 (two) times daily.       . magnesium chloride (SLOW-MAG) 64 MG TBEC Take 2 tablets by mouth 2 (two) times daily.      . metroNIDAZOLE (FLAGYL) 500 MG tablet Take 1 tablet (500 mg total) by mouth 2 (two) times daily.  14 tablet  0  . mycophenolate (MYFORTIC) 180 MG EC tablet Take 180 mg by mouth 2 (two) times daily.      Marland Kitchen omeprazole (PRILOSEC) 20 MG capsule       . predniSONE (DELTASONE) 5 MG tablet Take 5 mg by mouth daily.      Marland Kitchen sulfamethoxazole-trimethoprim (BACTRIM,SEPTRA) 400-80 MG per tablet       . tacrolimus (PROGRAF) 0.5 MG capsule Take 0.5 mg by mouth daily.      . tacrolimus (PROGRAF) 1 MG capsule Take 1 mg by mouth at bedtime.        No current facility-administered medications for this visit.    Exam: BP 148/94  Pulse 92  Ht 5\' 7"  (1.702 m)  Wt 163 lb 12.8 oz (74.299 kg)  BMI 25.65 kg/m2  LMP 05/08/2013 Gen: Well NAD HEENT: EOMI,  MMM Lungs: CTABL Nl WOB Heart: RRR no MRG Abd: NABS, NT, ND Exts: Non edematous BL  LE, warm and well perfused.   No results found for this or any previous visit (from the past 72 hour(s)).

## 2013-05-15 NOTE — Assessment & Plan Note (Signed)
Elevated even after recheck. No symptoms Consider changing regimen at next appt.  WIll be difficult to treat given renal transplant hx

## 2013-05-26 ENCOUNTER — Ambulatory Visit: Payer: Medicare Other

## 2013-05-31 ENCOUNTER — Encounter: Payer: Self-pay | Admitting: Family Medicine

## 2013-05-31 ENCOUNTER — Other Ambulatory Visit (HOSPITAL_COMMUNITY)
Admission: RE | Admit: 2013-05-31 | Discharge: 2013-05-31 | Disposition: A | Discharge status: Home / Self Care | Payer: Medicare Other | Source: Ambulatory Visit | Attending: Family Medicine | Admitting: Family Medicine

## 2013-05-31 ENCOUNTER — Ambulatory Visit (INDEPENDENT_AMBULATORY_CARE_PROVIDER_SITE_OTHER): Payer: Medicare Other | Admitting: Family Medicine

## 2013-05-31 VITALS — BP 141/101 | HR 77 | Temp 98.7°F | Ht 67.0 in | Wt 161.0 lb

## 2013-05-31 DIAGNOSIS — Z113 Encounter for screening for infections with a predominantly sexual mode of transmission: Secondary | ICD-10-CM | POA: Diagnosis not present

## 2013-05-31 DIAGNOSIS — R300 Dysuria: Secondary | ICD-10-CM | POA: Diagnosis not present

## 2013-05-31 DIAGNOSIS — R87810 Cervical high risk human papillomavirus (HPV) DNA test positive: Secondary | ICD-10-CM | POA: Insufficient documentation

## 2013-05-31 DIAGNOSIS — N760 Acute vaginitis: Secondary | ICD-10-CM

## 2013-05-31 DIAGNOSIS — Z124 Encounter for screening for malignant neoplasm of cervix: Secondary | ICD-10-CM

## 2013-05-31 DIAGNOSIS — R87619 Unspecified abnormal cytological findings in specimens from cervix uteri: Secondary | ICD-10-CM | POA: Diagnosis not present

## 2013-05-31 LAB — POCT UA - MICROSCOPIC ONLY

## 2013-05-31 LAB — POCT URINALYSIS DIPSTICK
Bilirubin, UA: NEGATIVE
Glucose, UA: NEGATIVE
Ketones, UA: NEGATIVE
Leukocytes, UA: NEGATIVE
Nitrite, UA: NEGATIVE
Protein, UA: 30
Spec Grav, UA: >=1.03
Urobilinogen, UA: 0.2
pH, UA: 5.5

## 2013-05-31 LAB — POCT WET PREP (WET MOUNT): Clue Cells Wet Prep Whiff POC: NEGATIVE

## 2013-05-31 NOTE — Patient Instructions (Signed)
It was nice to meet you today!  I am not sure what is causing your urinary discomfort. We did several tests today. I will call you if your test results are not normal.  Otherwise, I will send you a letter.  If you do not hear from me with in 2 weeks please call our office.      Please schedule an appointment with Dr. Otis Dials for a yearly physical. We did do your pap smear today.  Return if your discomfort is worsening or if you have any fevers.  Be well, Dr. Ardelia Mems

## 2013-06-01 LAB — URINE CULTURE
Colony Count: NO GROWTH
Organism ID, Bacteria: NO GROWTH

## 2013-06-02 NOTE — Progress Notes (Signed)
Patient ID: Candace Griffin, female   DOB: 1975-09-15, 37 y.o.   MRN: [id number]  HPI:  Pt presents for a same day appointment to discuss dysuria. She reports that 1-1/2 weeks ago she presented with BV. She had some dysuria then, and was treated for BV with Flagyl orally and is still doing MetroGel. She's continued to have some mild dysuria with urination. Has also had urinary frequency. Denies fever. Has had some hesitancy and inconsistent stream. States she has frequent UTIs and BV. Denies any symptoms of yeast infection like itching or rash. Her BV symptoms are better. She wonders if she is feeling some pressure down below.  ROS: See HPI  [place]: History of renal transplant in February 2013, followed locally by Dr. Justin Mend  PHYSICAL EXAM: BP 141/101  Pulse 77  Temp(Src) 98.7 F (37.1 C) (Oral)  Ht 5\' 7"  (1.702 m)  Wt 161 lb (73.029 kg)  BMI 25.21 kg/m2  LMP 05/08/2013 Gen: No acute distress, pleasant and cooperative, well-appearing Heart: Regular rate and rhythm Lungs: Clear to auscultation bilaterally, normal respiratory effort Abdomen: Soft, nontender to palpation. No palpable masses. Well-healed old surgical scar from renal transplant present in right lower quadrant. No CVA tenderness. GU: Normal appearing external genitalia. Vagina does have some thick white discharge present. No tenderness with bimanual exam, no adnexal masses, or cervical motion tenderness. Neuro: Grossly nonfocal, speech intact  ASSESSMENT/PLAN:  # Health maintenance:  -Pap smear was done today as patient is due for one and we were doing a speculum exam anyways. Pt had LSIL in pap 2 years ago, with a normal Pap last year. Advised that she needs to followup with her PCP for her the rest of her health maintenance items and a complete yearly physical.  See problem based charting for additional assessment/plan.   FOLLOW UP: F/u in one month for physical with PCP  Leeanne Rio, MD

## 2013-06-02 NOTE — Assessment & Plan Note (Addendum)
UA not suggestive of UTI today. Will send for culture, given patient's immunocompromised status and history of renal transplant. Will also send GC and Chlamydia as these can cause dysuria as well. Also check wet prep.

## 2013-06-16 ENCOUNTER — Telehealth: Payer: Self-pay | Admitting: Family Medicine

## 2013-06-16 NOTE — Telephone Encounter (Signed)
Attempted to contact patient to discuss pap smear results from her recent visit, which showed ASC-H with + high risk HPV. Based on current ASCCP guidelines, patient needs colposcopy. I called pt's cell phone and home phone, but cell phone is not accepting incoming calls and home phone had been disconnected. Will try again to contact patient on Monday, and if unable, will send certified letter to let her know she needs to schedule an appointment in [facility].  Will route note to PCP Dr. Otis Dials so that she is aware.  Leeanne Rio, MD

## 2013-06-19 ENCOUNTER — Encounter: Payer: Self-pay | Admitting: Family Medicine

## 2013-06-19 NOTE — Telephone Encounter (Signed)
Attempted again to reach patient by phone, by calling our listed mobile & home numbers - neither were accepting calls. Also called pt's work number but was told no one by her name worked there. Will send certified letter explaining results and need for colposcopy.  Leeanne Rio, MD

## 2013-06-23 DIAGNOSIS — Z940 Kidney transplant status: Secondary | ICD-10-CM | POA: Diagnosis not present

## 2013-06-23 DIAGNOSIS — Z79899 Other long term (current) drug therapy: Secondary | ICD-10-CM | POA: Diagnosis not present

## 2013-06-29 ENCOUNTER — Other Ambulatory Visit: Payer: Self-pay

## 2013-06-29 ENCOUNTER — Ambulatory Visit: Payer: Medicare Other | Admitting: Family Medicine

## 2013-07-19 ENCOUNTER — Encounter: Payer: Self-pay | Admitting: Family Medicine

## 2013-07-19 ENCOUNTER — Ambulatory Visit (INDEPENDENT_AMBULATORY_CARE_PROVIDER_SITE_OTHER): Payer: Medicare Other | Admitting: Family Medicine

## 2013-07-19 VITALS — BP 119/84 | HR 81 | Ht 67.0 in | Wt 166.2 lb

## 2013-07-19 DIAGNOSIS — N898 Other specified noninflammatory disorders of vagina: Secondary | ICD-10-CM | POA: Diagnosis not present

## 2013-07-19 DIAGNOSIS — R87610 Atypical squamous cells of undetermined significance on cytologic smear of cervix (ASC-US): Secondary | ICD-10-CM | POA: Insufficient documentation

## 2013-07-19 DIAGNOSIS — R87810 Cervical high risk human papillomavirus (HPV) DNA test positive: Secondary | ICD-10-CM | POA: Diagnosis not present

## 2013-07-19 NOTE — Patient Instructions (Signed)
As we talked about, I recommend that you get your colposcopy.   HPV Test The HPV (human papillomavirus) test is used to screen for high-risk types with HPV infection. HPV is a group of about 100 related viruses, of which 40 types are genital viruses. Most HPV viruses cause infections that usually resolve without treatment within 2 years. Some HPV infections can cause skin and genital warts (condylomata). HPV types 16, 18, 31 and 45 are considered high-risk types of HPV. High-risk types of HPV do not usually cause visible warts, but if untreated, may lead to cancers of the outlet of the womb (cervix) or anus. An HPV test identifies the DNA (genetic) strands of the HPV infection. Because the test identifies the DNA strands, the test is also referred to as the HPV DNA test. Although HPV is found in both males and females, the HPV test is only used to screen for cervical cancer in females. This test is recommended for females:  With an abnormal Pap test.  After treatment of an abnormal Pap test.  Aged 17 and older.  After treatment of a high-risk HPV infection. The HPV test may be done at the same time as a Pap test in females over the age of 63. Both the HPV and Pap test require a sample of cells from the cervix. PREPARATION FOR TEST  You may be asked to avoid douching, tampons, or vaginal medicines for 48 hours before the HPV test. You will be asked to urinate before the test. For the HPV test, you will need to lie on an exam table with your feet in stirrups. A spatula will be inserted into the vagina. The spatula will be used to swab the cervix for a cell and mucus sample. The sample will be further evaluated in a lab under a microscope. NORMAL FINDINGS  Normal: High-risk HPV is not found.  Ranges for normal findings may vary among different laboratories and hospitals. You should always check with your doctor after having lab work or other tests done to discuss the meaning of your test results and  whether your values are considered within normal limits. MEANING OF TEST An abnormal HPV test means that high-risk HPV is found. Your caregiver may recommend further testing. Your caregiver will go over the test results with you. He or she will and discuss the importance and meaning of your results, as well as treatment options and the need for additional tests, if necessary. OBTAINING THE RESULTS  It is your responsibility to obtain your test results. Ask the lab or department performing the test when and how you will get your results. Document Released: 09/04/2004 Document Revised: 11/02/2011 Document Reviewed: 05/20/2005 [facility] Patient Information 2014 [place].

## 2013-07-19 NOTE — Assessment & Plan Note (Signed)
Still present, but since it is unchanged since the last time she had a workup for at I do not feel like she needs repeat testing. If changes in symptoms or severity, consider repeat.

## 2013-07-19 NOTE — Progress Notes (Signed)
Patient ID: Rupal Wetherell    DOB: 01/23/1976, 37 y.o.   MRN: [id number] --- Subjective:  Marypatricia is a 37 y.o.female who presents for discussion of her recent Pap smear results. 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 Patient states that she has been with the same partner for last 4 years. Has had unprotected sex with him for the last 13 months. She reports being exclusive but cannot be certain that he has been.  Records show that she had had a Pap smear in 2012 showing LSIL. she then had a repeat Pap smear in 2013 which was normal.  She reports continued vaginal yellow discharge not associated with any itching, burning. Sometimes associated with a mild odor. She was checked for BV, yeast, trichomoniasis, GC Chlamydia at her last visit in October and it was all normal. Her symptoms have not changed since then.  ROS: see HPI Past Medical History: reviewed and updated medications and allergies. Of note, patient is on immunosuppressive therapy for her kidney transplant from 2013. Social History: Tobacco: None   Objective: Filed Vitals:   07/19/13 0901  BP: 119/84  Pulse: 81    Physical Examination:   General appearance - alert, well appearing, and in no distress

## 2013-07-19 NOTE — Assessment & Plan Note (Signed)
Reviewed Pap results with patient. Reviewed pathology of high-risk HPV. Told her that the next step in therapy his colposcopy. Reviewed procedure with her. She is to make an appointment with the women's clinic here for colpo.

## 2013-07-27 ENCOUNTER — Ambulatory Visit (INDEPENDENT_AMBULATORY_CARE_PROVIDER_SITE_OTHER): Payer: Medicare Other | Admitting: Family Medicine

## 2013-07-27 ENCOUNTER — Encounter: Payer: Self-pay | Admitting: Family Medicine

## 2013-07-27 VITALS — BP 116/62 | HR 85 | Temp 98.2°F | Ht 67.0 in | Wt 167.6 lb

## 2013-07-27 DIAGNOSIS — A630 Anogenital (venereal) warts: Secondary | ICD-10-CM

## 2013-07-27 DIAGNOSIS — R87611 Atypical squamous cells cannot exclude high grade squamous intraepithelial lesion on cytologic smear of cervix (ASC-H): Secondary | ICD-10-CM

## 2013-07-27 DIAGNOSIS — N870 Mild cervical dysplasia: Secondary | ICD-10-CM | POA: Diagnosis not present

## 2013-07-27 MED ORDER — IMIQUIMOD 5 % EX CREA: TOPICAL_CREAM | CUTANEOUS | Status: DC

## 2013-07-27 NOTE — Patient Instructions (Signed)
Use the aldara three times a week as directed for lesions. See me back in January sometime for follow up

## 2013-07-28 NOTE — Progress Notes (Signed)
Patient ID: Candace Griffin, female   DOB: 03/17/1976, 37 y.o.   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 PERTINENT  PMH / PSH: Patient immunosuppressed secondary to kidney transplant. SUBJECTIVE: Patient also has complained of some bumps on her external genital areas that she's noticed in the last month or 2. Menses have been regular.   OBJ: Multiple flat warts noted on the perineal and genital area. The largest is about 2-3 mm in diameter. Patient given informed consent, signed copy in the chart.  Placed in lithotomy position. Cervix viewed with speculum and colposcope after application of acetic acid.   Colposcopy adequate (entire squamocolumnar junctions seen  in entirety) ?  Yes Acetowhite lesions?Yes acetowhite lesion at for clock with some extension to the 6:00 area. There's also some mild erythema. Punctation?No Mosaicism?  No Abnormal vasculature?  Wanted to slightly abnormal vessels in the area described above Biopsies?Biopsy taken in area described above ECC?ECC not done secondary to no extension of the lesion into the endocervical canal. Complications? Minimal amount of bleeding which was resolved with pressure and Monsel solution.  COMMENTS: Patient was given post procedure instructions.  I will notify her of any pathology results.   Given her Pap findings in her status of immunosuppression, even if the pathology is negative I would recommend Pap and/or colposcopy within a year. 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 If she cannot afford that [place] on her insurance plan, I will see her back in we will start the docile and treatment. I think the out there would be a better choice given the numerous lesions. UA like to see her back in one month

## 2013-07-31 DIAGNOSIS — A630 Anogenital (venereal) warts: Secondary | ICD-10-CM | POA: Insufficient documentation

## 2013-08-01 ENCOUNTER — Telehealth: Payer: Self-pay | Admitting: Family Medicine

## 2013-08-01 ENCOUNTER — Encounter: Payer: Self-pay | Admitting: Family Medicine

## 2013-08-01 DIAGNOSIS — Z940 Kidney transplant status: Secondary | ICD-10-CM

## 2013-08-01 DIAGNOSIS — R87610 Atypical squamous cells of undetermined significance on cytologic smear of cervix (ASC-US): Secondary | ICD-10-CM

## 2013-08-01 DIAGNOSIS — N870 Mild cervical dysplasia: Secondary | ICD-10-CM

## 2013-08-01 NOTE — Progress Notes (Signed)
Patient ID: Candace Griffin, female   DOB: Dec 14, 1975, 37 y.o.   MRN: [id number] Attempted to call her with results cervical biopsy--no answer and no way to leave voice mail. I will try her again tomorrow--CIN1 on bx--given her immunosuppression for kidney transplant  (and her new dx of genital warts) i will likely  Send her to GYN for further mgmt. Dorcas Mcmurray

## 2013-08-02 NOTE — Telephone Encounter (Signed)
Discussed w pt--see my documentation from yesterday--will refer to [place]

## 2013-09-20 DIAGNOSIS — E785 Hyperlipidemia, unspecified: Secondary | ICD-10-CM | POA: Diagnosis not present

## 2013-09-20 DIAGNOSIS — N185 Chronic kidney disease, stage 5: Secondary | ICD-10-CM | POA: Diagnosis not present

## 2013-09-20 DIAGNOSIS — N039 Chronic nephritic syndrome with unspecified morphologic changes: Secondary | ICD-10-CM | POA: Diagnosis not present

## 2013-09-20 DIAGNOSIS — Z940 Kidney transplant status: Secondary | ICD-10-CM | POA: Diagnosis not present

## 2013-09-20 DIAGNOSIS — D631 Anemia in chronic kidney disease: Secondary | ICD-10-CM | POA: Diagnosis not present

## 2013-10-04 ENCOUNTER — Encounter: Payer: Medicare Other | Admitting: Obstetrics and Gynecology

## 2013-11-01 ENCOUNTER — Inpatient Hospital Stay (HOSPITAL_COMMUNITY)
Admission: EM | Admit: 2013-11-01 | Discharge: 2013-11-03 | DRG: 683 | Disposition: A | Discharge status: Home / Self Care | Payer: Medicare Other | Attending: Family Medicine | Admitting: Family Medicine

## 2013-11-01 ENCOUNTER — Encounter (HOSPITAL_COMMUNITY): Payer: Self-pay | Admitting: Emergency Medicine

## 2013-11-01 DIAGNOSIS — R197 Diarrhea, unspecified: Secondary | ICD-10-CM | POA: Diagnosis present

## 2013-11-01 DIAGNOSIS — Z9119 Patient's noncompliance with other medical treatment and regimen: Secondary | ICD-10-CM

## 2013-11-01 DIAGNOSIS — Z940 Kidney transplant status: Secondary | ICD-10-CM | POA: Diagnosis not present

## 2013-11-01 DIAGNOSIS — I10 Essential (primary) hypertension: Secondary | ICD-10-CM | POA: Diagnosis not present

## 2013-11-01 DIAGNOSIS — N390 Urinary tract infection, site not specified: Secondary | ICD-10-CM | POA: Diagnosis not present

## 2013-11-01 DIAGNOSIS — N183 Chronic kidney disease, stage 3 unspecified: Secondary | ICD-10-CM | POA: Diagnosis present

## 2013-11-01 DIAGNOSIS — R1115 Cyclical vomiting syndrome unrelated to migraine: Secondary | ICD-10-CM | POA: Diagnosis present

## 2013-11-01 DIAGNOSIS — Z91199 Patient's noncompliance with other medical treatment and regimen due to unspecified reason: Secondary | ICD-10-CM | POA: Diagnosis not present

## 2013-11-01 DIAGNOSIS — K219 Gastro-esophageal reflux disease without esophagitis: Secondary | ICD-10-CM | POA: Diagnosis present

## 2013-11-01 DIAGNOSIS — A498 Other bacterial infections of unspecified site: Secondary | ICD-10-CM | POA: Diagnosis present

## 2013-11-01 DIAGNOSIS — I959 Hypotension, unspecified: Secondary | ICD-10-CM | POA: Diagnosis present

## 2013-11-01 DIAGNOSIS — E860 Dehydration: Secondary | ICD-10-CM | POA: Diagnosis not present

## 2013-11-01 DIAGNOSIS — I129 Hypertensive chronic kidney disease with stage 1 through stage 4 chronic kidney disease, or unspecified chronic kidney disease: Secondary | ICD-10-CM | POA: Diagnosis present

## 2013-11-01 DIAGNOSIS — N039 Chronic nephritic syndrome with unspecified morphologic changes: Secondary | ICD-10-CM

## 2013-11-01 DIAGNOSIS — N179 Acute kidney failure, unspecified: Principal | ICD-10-CM | POA: Diagnosis present

## 2013-11-01 DIAGNOSIS — N19 Unspecified kidney failure: Secondary | ICD-10-CM

## 2013-11-01 DIAGNOSIS — N12 Tubulo-interstitial nephritis, not specified as acute or chronic: Secondary | ICD-10-CM | POA: Diagnosis present

## 2013-11-01 DIAGNOSIS — D631 Anemia in chronic kidney disease: Secondary | ICD-10-CM | POA: Diagnosis present

## 2013-11-01 DIAGNOSIS — R1110 Vomiting, unspecified: Secondary | ICD-10-CM

## 2013-11-01 DIAGNOSIS — D649 Anemia, unspecified: Secondary | ICD-10-CM | POA: Diagnosis not present

## 2013-11-01 HISTORY — DX: Gastro-esophageal reflux disease without esophagitis: K21.9

## 2013-11-01 LAB — CBC WITH DIFFERENTIAL/PLATELET
Basophils Absolute: 0 10*3/uL (ref 0.0–0.1)
Basophils Relative: 0 % (ref 0–1)
Eosinophils Absolute: 0.2 10*3/uL (ref 0.0–0.7)
Eosinophils Relative: 3 % (ref 0–5)
HCT: 31.2 % — ABNORMAL LOW (ref 36.0–46.0)
Hemoglobin: 10.8 g/dL — ABNORMAL LOW (ref 12.0–15.0)
Lymphocytes Relative: 16 % (ref 12–46)
Lymphs Abs: 1.1 10*3/uL (ref 0.7–4.0)
MCH: 30.7 pg (ref 26.0–34.0)
MCHC: 34.6 g/dL (ref 30.0–36.0)
MCV: 88.6 fL (ref 78.0–100.0)
Monocytes Absolute: 0.5 10*3/uL (ref 0.1–1.0)
Monocytes Relative: 7 % (ref 3–12)
Neutro Abs: 5.3 10*3/uL (ref 1.7–7.7)
Neutrophils Relative %: 74 % (ref 43–77)
Platelets: 401 10*3/uL — ABNORMAL HIGH (ref 150–400)
RBC: 3.52 MIL/uL — ABNORMAL LOW (ref 3.87–5.11)
RDW: 13.1 % (ref 11.5–15.5)
WBC: 7.2 10*3/uL (ref 4.0–10.5)

## 2013-11-01 LAB — COMPREHENSIVE METABOLIC PANEL
ALT: 58 U/L — ABNORMAL HIGH (ref 0–35)
AST: 58 U/L — ABNORMAL HIGH (ref 0–37)
Albumin: 3.6 g/dL (ref 3.5–5.2)
Alkaline Phosphatase: 135 U/L — ABNORMAL HIGH (ref 39–117)
BUN: 89 mg/dL — ABNORMAL HIGH (ref 6–23)
CO2: 19 mEq/L (ref 19–32)
Calcium: 9.8 mg/dL (ref 8.4–10.5)
Chloride: 96 mEq/L (ref 96–112)
Creatinine, Ser: 3.05 mg/dL — ABNORMAL HIGH (ref 0.50–1.10)
GFR calc Af Amer: 21 mL/min — ABNORMAL LOW (ref >90)
GFR calc non Af Amer: 18 mL/min — ABNORMAL LOW (ref >90)
Glucose, Bld: 91 mg/dL (ref 70–99)
Potassium: 3.7 mEq/L (ref 3.7–5.3)
Sodium: 135 mEq/L — ABNORMAL LOW (ref 137–147)
Total Bilirubin: 0.3 mg/dL (ref 0.3–1.2)
Total Protein: 8.1 g/dL (ref 6.0–8.3)

## 2013-11-01 LAB — URINALYSIS, ROUTINE W REFLEX MICROSCOPIC
Bilirubin Urine: NEGATIVE
Glucose, UA: NEGATIVE mg/dL
Ketones, ur: NEGATIVE mg/dL
Nitrite: NEGATIVE
Protein, ur: 30 mg/dL — AB
Specific Gravity, Urine: 1.017 (ref 1.005–1.030)
Urobilinogen, UA: 0.2 mg/dL (ref 0.0–1.0)
pH: 5 (ref 5.0–8.0)

## 2013-11-01 LAB — URINE MICROSCOPIC-ADD ON

## 2013-11-01 LAB — PREGNANCY, URINE: Preg Test, Ur: NEGATIVE

## 2013-11-01 MED ORDER — ONDANSETRON HCL 4 MG/2ML IJ SOLN
4.0000 mg | Freq: Once | INTRAMUSCULAR | Status: AC
  Administered 2013-11-01: 4 mg via INTRAVENOUS
  Filled 2013-11-01: qty 2

## 2013-11-01 MED ORDER — SODIUM CHLORIDE 0.9 % IV SOLN
1000.0000 mL | INTRAVENOUS | Status: DC
  Administered 2013-11-02 – 2013-11-03 (×4): 1000 mL via INTRAVENOUS

## 2013-11-01 MED ORDER — SODIUM CHLORIDE 0.9 % IV SOLN
1000.0000 mL | Freq: Once | INTRAVENOUS | Status: AC
  Administered 2013-11-01: 1000 mL via INTRAVENOUS

## 2013-11-01 MED ORDER — DEXTROSE 5 % IV SOLN
1.0000 g | Freq: Once | INTRAVENOUS | Status: AC
  Administered 2013-11-01: 1 g via INTRAVENOUS
  Filled 2013-11-01: qty 10

## 2013-11-01 NOTE — ED Notes (Signed)
Pt hyperventilating upon arrival to pt's room, pt placed on a NRB for comfort. Pt states she felt like she couldn't breath. Pt's 02 sats 100%. Pt went "slump" leaned her head towards her left shoulder and closed her eyes, pt not responding to verbal stimuli, sternal rub performed pt did not respond, ammonia placed on pt's nose, pt opened her eyes and pushed the ammonia away. Pt asked what is going on, pt states she has pain in her back and in her abdomen. MD and PA aware.

## 2013-11-01 NOTE — ED Notes (Signed)
Upon pt being roomed, Pt began hyperventilating and would not respond to verbal commands. Ammonia used and patient awoke and began responding to verbal commands, pt placed on non re breather mask. 20G PIV placed to [place], pt vitals stable. Pt boyfriend states that patient has not been feeling well or eating for a few days now and that she stopped taking her medications for her kidneys over a week ago.

## 2013-11-01 NOTE — ED Notes (Signed)
Monday night she ate a chicken sandwich at a restaurant.  Hours later she started vomiting and diarrhea started.  She has been hurting last Monday that is when her symptoms started.  Body aches this past Monday..  She had kidney transplant 2 years aqo.  lmp  Feb last week

## 2013-11-02 ENCOUNTER — Encounter (HOSPITAL_COMMUNITY): Payer: Self-pay | Admitting: *Deleted

## 2013-11-02 DIAGNOSIS — A498 Other bacterial infections of unspecified site: Secondary | ICD-10-CM | POA: Diagnosis not present

## 2013-11-02 DIAGNOSIS — E860 Dehydration: Secondary | ICD-10-CM

## 2013-11-02 DIAGNOSIS — N390 Urinary tract infection, site not specified: Secondary | ICD-10-CM | POA: Diagnosis not present

## 2013-11-02 DIAGNOSIS — I10 Essential (primary) hypertension: Secondary | ICD-10-CM | POA: Diagnosis not present

## 2013-11-02 DIAGNOSIS — N19 Unspecified kidney failure: Secondary | ICD-10-CM | POA: Diagnosis not present

## 2013-11-02 DIAGNOSIS — N12 Tubulo-interstitial nephritis, not specified as acute or chronic: Secondary | ICD-10-CM | POA: Diagnosis present

## 2013-11-02 DIAGNOSIS — Z940 Kidney transplant status: Secondary | ICD-10-CM | POA: Diagnosis not present

## 2013-11-02 DIAGNOSIS — N179 Acute kidney failure, unspecified: Secondary | ICD-10-CM | POA: Diagnosis not present

## 2013-11-02 LAB — URINE CULTURE: Colony Count: >=100000

## 2013-11-02 LAB — BASIC METABOLIC PANEL
BUN: 74 mg/dL — ABNORMAL HIGH (ref 6–23)
CO2: 15 mEq/L — ABNORMAL LOW (ref 19–32)
Calcium: 9.1 mg/dL (ref 8.4–10.5)
Chloride: 106 mEq/L (ref 96–112)
Creatinine, Ser: 2.46 mg/dL — ABNORMAL HIGH (ref 0.50–1.10)
GFR calc Af Amer: 28 mL/min — ABNORMAL LOW (ref >90)
GFR calc non Af Amer: 24 mL/min — ABNORMAL LOW (ref >90)
Glucose, Bld: 100 mg/dL — ABNORMAL HIGH (ref 70–99)
Potassium: 3.6 mEq/L — ABNORMAL LOW (ref 3.7–5.3)
Sodium: 139 mEq/L (ref 137–147)

## 2013-11-02 LAB — CBC
HCT: 28 % — ABNORMAL LOW (ref 36.0–46.0)
Hemoglobin: 9.3 g/dL — ABNORMAL LOW (ref 12.0–15.0)
MCH: 29.4 pg (ref 26.0–34.0)
MCHC: 33.2 g/dL (ref 30.0–36.0)
MCV: 88.6 fL (ref 78.0–100.0)
Platelets: 352 10*3/uL (ref 150–400)
RBC: 3.16 MIL/uL — ABNORMAL LOW (ref 3.87–5.11)
RDW: 13 % (ref 11.5–15.5)
WBC: 5.7 10*3/uL (ref 4.0–10.5)

## 2013-11-02 LAB — CREATININE, URINE, RANDOM: Creatinine, Urine: 47.56 mg/dL

## 2013-11-02 LAB — SODIUM, URINE, RANDOM: Sodium, Ur: 22 mEq/L

## 2013-11-02 MED ORDER — TACROLIMUS 1 MG PO CAPS
1.0000 mg | ORAL_CAPSULE | Freq: Every day | ORAL | Status: DC
  Filled 2013-11-02: qty 1

## 2013-11-02 MED ORDER — ACETAMINOPHEN 325 MG PO TABS: 650.0000 mg | ORAL_TABLET | Freq: Four times a day (QID) | ORAL | Status: DC | PRN

## 2013-11-02 MED ORDER — TACROLIMUS 0.5 MG PO CAPS
0.5000 mg | ORAL_CAPSULE | Freq: Every day | ORAL | Status: DC
Start: 2013-11-02 — End: 2013-11-02
  Administered 2013-11-02: 0.5 mg via ORAL
  Filled 2013-11-02: qty 1

## 2013-11-02 MED ORDER — TACROLIMUS 1 MG PO CAPS: 1.0000 mg | ORAL_CAPSULE | Freq: Two times a day (BID) | ORAL | Status: DC

## 2013-11-02 MED ORDER — MAGNESIUM CHLORIDE 64 MG PO TBEC
2.0000 | DELAYED_RELEASE_TABLET | Freq: Two times a day (BID) | ORAL | Status: DC
  Filled 2013-11-02 (×2): qty 2

## 2013-11-02 MED ORDER — ONDANSETRON HCL 4 MG/2ML IJ SOLN: 4.0000 mg | Freq: Four times a day (QID) | INTRAMUSCULAR | Status: DC | PRN

## 2013-11-02 MED ORDER — MAGNESIUM CHLORIDE 64 MG PO TBEC
2.0000 | DELAYED_RELEASE_TABLET | Freq: Two times a day (BID) | ORAL | Status: DC
  Administered 2013-11-02 – 2013-11-03 (×3): 128 mg via ORAL
  Filled 2013-11-02 (×5): qty 2

## 2013-11-02 MED ORDER — ACETAMINOPHEN 650 MG RE SUPP: 650.0000 mg | Freq: Four times a day (QID) | RECTAL | Status: DC | PRN

## 2013-11-02 MED ORDER — TACROLIMUS 1 MG PO CAPS
1.0000 mg | ORAL_CAPSULE | Freq: Once | ORAL | Status: AC
  Administered 2013-11-02: 1 mg via ORAL
  Filled 2013-11-02: qty 1

## 2013-11-02 MED ORDER — PREDNISONE 10 MG PO TABS
10.0000 mg | ORAL_TABLET | Freq: Every day | ORAL | Status: DC
  Administered 2013-11-02 – 2013-11-03 (×2): 10 mg via ORAL
  Filled 2013-11-02 (×4): qty 1

## 2013-11-02 MED ORDER — HEPARIN SODIUM (PORCINE) 5000 UNIT/ML IJ SOLN
5000.0000 [IU] | Freq: Three times a day (TID) | INTRAMUSCULAR | Status: DC
  Administered 2013-11-02 – 2013-11-03 (×3): 5000 [IU] via SUBCUTANEOUS
  Filled 2013-11-02 (×6): qty 1

## 2013-11-02 MED ORDER — PANTOPRAZOLE SODIUM 40 MG PO TBEC
40.0000 mg | DELAYED_RELEASE_TABLET | Freq: Every day | ORAL | Status: DC
Start: 2013-11-02 — End: 2013-11-03
  Administered 2013-11-02 – 2013-11-03 (×2): 40 mg via ORAL
  Filled 2013-11-02 (×2): qty 1

## 2013-11-02 MED ORDER — CIPROFLOXACIN HCL 500 MG PO TABS
500.0000 mg | ORAL_TABLET | Freq: Every day | ORAL | Status: DC
  Administered 2013-11-02 – 2013-11-03 (×2): 500 mg via ORAL
  Filled 2013-11-02 (×3): qty 1

## 2013-11-02 MED ORDER — TACROLIMUS 0.5 MG PO CAPS: 0.5000 mg | ORAL_CAPSULE | Freq: Two times a day (BID) | ORAL | Status: DC

## 2013-11-02 MED ORDER — MYCOPHENOLATE SODIUM 180 MG PO TBEC
360.0000 mg | DELAYED_RELEASE_TABLET | Freq: Two times a day (BID) | ORAL | Status: DC
  Administered 2013-11-02 – 2013-11-03 (×3): 360 mg via ORAL
  Filled 2013-11-02 (×4): qty 2

## 2013-11-02 MED ORDER — ONDANSETRON HCL 4 MG PO TABS: 4.0000 mg | ORAL_TABLET | Freq: Four times a day (QID) | ORAL | Status: DC | PRN

## 2013-11-02 MED ORDER — SULFAMETHOXAZOLE-TRIMETHOPRIM 400-80 MG PO TABS
1.0000 | ORAL_TABLET | ORAL | Status: DC
  Administered 2013-11-03: 1 via ORAL
  Filled 2013-11-02: qty 1

## 2013-11-02 MED ORDER — TACROLIMUS 1 MG PO CAPS
1.5000 mg | ORAL_CAPSULE | Freq: Two times a day (BID) | ORAL | Status: DC
  Administered 2013-11-02 – 2013-11-03 (×2): 1.5 mg via ORAL
  Filled 2013-11-02 (×3): qty 1

## 2013-11-02 NOTE — H&P (Signed)
[facility] Admission History and Physical Service Pager: [phone]  Patient name: Candace Griffin Medical record number: [id number] Date of birth: 06-Feb-1976 Age: 38 y.o. Gender: female  Primary Care Provider: Liam Graham, MD Consultants: None currently, nephrology in the am Code Status: Full  Chief Complaint: Nausea, vomiting, dysuria  Assessment and Plan: Candace Griffin is a 38 y.o. female presenting with persistent nausea, vomiting, dysuria, and abdominal pain consistent with pyelonephritis . PMH is significant for ESRD s/p deceased donor kidney transplant 09/30/2011, HTN, and GERD.  Pyelonephritis/complicated UTI - in the presence of immuno-suppression and renal transplant - Consider this pyelonephritis with tenderness over her transplanted kidney, she remains afebrile, normotensive, and without tachycardia. She does not meet any SIRS criteria - Will continue to monitor for rejection, I feel this is unlikely as her current creatinine is 3.05 which is only a slight departure from her previous 2.3 this month. She has very mild tenderness to palpation. Considering this we will contact nephrology in the morning - Urine culture pending - She's been treated with 1 g Rocephin in the ER, will start oral ciprofloxacin in the am - Repeat BMP and CBC in the Am - Regular diet as tolerated, IV zofran, IV NS at 125 ml/hr  AKI on CKD s/p renal transplant in 09/2011 - Likely UTI and dehydration related - check FENa, BUN/Cre ratio 30 - s/p 1 L NS bolus X 1 in the ED - IV fluids and antibiotics as above  Chronic kidney disease, renal transplant - Doubt rejection, however will monitor closely, recent poor compliance with meds - Monitor creatinine - stressed importance of anti-rejection meds, Continue mycophenolate, tacrolimus, prednisone, and Bactrim (M/W/F)  - Renal consult in the am  HTN - Blood pressures well controlled tonight  - hold labetalol with volume  depletion and only once daily dosing, restart if she becomes hypertensive  Normocytic anemia - Hgb 10.5, unclear baseline since transplant but likely at baseline - Likely anemia of chronic renal disease, monitor  GERD- continue PPI  FEN/GI: NS at 125 ml/hr, regular diet Prophylaxis: sub q heparin  Disposition: MedSurg for close observation  History of Present Illness: Candace Griffin is a 38 y.o. female presenting with complicated UTI after renal transplant 2 years ago. She states for the last 9-10 days she's had persistent nausea, vomiting, and diarrhea. She states that she's been drinking more due to unquenchable thirst. She describes her vomiting and diarrhea is clear water-like fluid. She states that she's had approximately 5 episodes of diarrhea daily during this time. For the last one today she's had dysuria, dull abdominal pain to her lower abdomen extending into her back bilaterally, and subjective fevers with chills. She states for the last 9-10 days she's had very poor by mouth intake and only tolerated a small amount of food today during this time. She last saw her nephrologist less than a month ago and had a creatinine of 2.3 at that time. She states that her creatinine is been ranging 2.0-2.5 since her renal transplant.   She reports poor compliance with her antirejection medications during this time saying that she has not had them in 7-10 days.   Review Of Systems: Per HPI, Otherwise 12 point review of systems was performed and was unremarkable.  Patient Active Problem List   Diagnosis Date Noted  . Genital warts 07/31/2013  . Atypical squamous cell changes of undetermined significance (ASCUS) on cervical cytology with positive high risk human papilloma virus (HPV) 07/19/2013  . Vaginal  discharge 03/01/2013  . Dysuria 03/01/2013  . Right ankle pain 12/13/2012  . Hypertension 04/21/2012  . Well woman exam 04/21/2012  . History of renal transplant 01/07/2012  . Cerumen  impaction 12/29/2011  . Bacterial vaginosis 04/29/2011  . GERD 10/10/2009  . ANEMIA 08/11/2007  . GENITAL HERPES, HX OF 08/11/2007  . End-stage renal disease needing dialysis 10/21/2006   Past Medical History: Past Medical History  Diagnosis Date  . Dialysis patient   . Hypertension   . History of renal transplant 2011/10/20   Past Surgical History: Past Surgical History  Procedure Laterality Date  . Diaylsis shunt    . Kidney transplant  October 20, 2011    deceased donor kidney   Social History: History  Substance Use Topics  . Smoking status: Never Smoker   . Smokeless tobacco: Not on file  . Alcohol Use: No   Additional social history: Please also refer to relevant sections of EMR.  Family History: Family History  Problem Relation Age of Onset  . Diabetes Father   . Diabetes Paternal Aunt   . Alcohol abuse Paternal Uncle   . Cancer Maternal Grandmother     breast  . Diabetes Paternal Grandmother   . Alcohol abuse Paternal Grandmother   . Alcohol abuse Paternal Grandfather    Allergies and Medications: No Known Allergies No current facility-administered medications on file prior to encounter.   Current Outpatient Prescriptions on File Prior to Encounter  Medication Sig Dispense Refill  . magnesium chloride (SLOW-MAG) 64 MG TBEC Take 2 tablets by mouth 2 (two) times daily.      . mycophenolate (MYFORTIC) 180 MG EC tablet Take 360 mg by mouth 2 (two) times daily.       Marland Kitchen omeprazole (PRILOSEC) 20 MG capsule       . predniSONE (DELTASONE) 5 MG tablet Take 10 mg by mouth daily.       Marland Kitchen sulfamethoxazole-trimethoprim (BACTRIM,SEPTRA) 400-80 MG per tablet Take 1 tablet by mouth See admin instructions. Monday Wednesday Friday      . tacrolimus (PROGRAF) 0.5 MG capsule Take 0.5 mg by mouth daily.      . tacrolimus (PROGRAF) 1 MG capsule Take 1 mg by mouth at bedtime.         Objective: BP 126/77  Pulse 80  Temp(Src) 97.7 F (36.5 C)  Resp 18  Ht 5\' 6"  (1.676 m)  Wt 153 lb  (69.4 kg)  BMI 24.71 kg/m2  SpO2 100%  LMP 10/25/2013 Exam: Gen: NAD, alert, cooperative with exam HEENT: NCAT, EOMI, PERRL, MMM CV: RRR, good S1/S2, no murmur Resp: CTABL, no wheezes, non-labored Abd: soft, mild tenderness to palpation over lower abd over her transplanted kidney, no cva tenderness Ext: No edema, warm Neuro: Alert and oriented, strength 5/5 and sensation intact in all 4 extremities, normal speech   Labs and Imaging: CBC BMET   Recent Labs Lab 11/01/13 1835  WBC 7.2  HGB 10.8*  HCT 31.2*  PLT 401*    Recent Labs Lab 11/01/13 1835  NA 135*  K 3.7  CL 96  CO2 19  BUN 89*  CREATININE 3.05*  GLUCOSE 91  CALCIUM 9.8     U preg - negative   11/01/2013 18:28  Color, Urine YELLOW  APPearance CLOUDY (A)  Specific Gravity, Urine 1.017  pH 5.0  Glucose NEGATIVE  Bilirubin Urine NEGATIVE  Ketones, ur NEGATIVE  Protein 30 (A)  Urobilinogen, UA 0.2  Nitrite NEGATIVE  Leukocytes, UA LARGE (A)  Hgb urine dipstick  MODERATE (A)  WBC, UA TOO NUMEROUS TO COUNT  RBC / HPF 3-6  Squamous Epithelial / LPF FEW (A)  Bacteria, UA MANY (A)    Timmothy Euler, MD 11/02/2013, 1:50 AM PGY-2, [place] Intern pager: [phone], text pages welcome

## 2013-11-02 NOTE — Progress Notes (Signed)
Family Medicine Teaching Service Daily Progress Note Intern Pager: [phone]  Patient name: Candace Griffin Medical record number: [id number] Date of birth: Dec 25, 1975 Age: 38 y.o. Gender: female  Primary Care Provider: Liam Graham, MD Consultants: Nephrology  Code Status: full   Pt Overview and Major Events to Date:  3/12: admitted for pyelonephritis/complicated UTI   ABX/Culture CTX 3/11 Cipro 3/12>>  Urine cx 3/11>>  Assessment and Plan: Candace Griffin is a 38 y.o. female presenting with persistent nausea, vomiting, dysuria, and abdominal pain consistent with pyelonephritis . PMH is significant for ESRD s/p deceased donor kidney transplant 09/30/2011, HTN, and GERD.   #Pyelonephritis/complicated UTI - in the presence of immuno-suppression and renal transplant. Afebrile since admission. No CVA tenderness and nausea improving.  - Urine culture pending  - 1 g Rocephin in the ER  - oral ciprofloxacin 500 mg QD  - Regular diet as tolerated, IV zofran, IV NS at 125 ml/hr   #AKI on CKD s/p renal transplant in 09/2011: Likely related to dehydration related. Baseline 2.3 with Cr improving this am.  - FENa: 0.8:  indicating prerenal   - IV fluids and antibiotics as above   #Chronic kidney disease, renal transplant: Followed by Dr. Justin Mend at [facility]. Doubt rejection.  - continue to monitor  - Continue mycophenolate, tacrolimus, prednisone, and Bactrim (M/W/F)  - BMP as above.   #HTN: well controlled  - hold labetalol with volume depletion and only once daily dosing, restart if she becomes hypertensive   #Normocytic anemia: Hgb 10.5, unclear baseline since transplant but likely at baseline  - Likely anemia of chronic renal disease, monitor   #GERD- continue PPI   FEN/GI: NS at 125 ml/hr, regular diet  Prophylaxis: sub q heparin  Disposition: pending improvement   Subjective: She is feeling better this AM. She has no complaints. She is starting to have somewhat of an  appetite this morning.   Objective: Temp:  [97.7 F (36.5 C)-98.3 F (36.8 C)] 98.3 F (36.8 C) (03/12 0315) Pulse Rate:  [64-92] 70 (03/12 0315) Resp:  [18-20] 18 (03/12 0315) BP: (105-126)/(67-87) 110/69 mmHg (03/12 0315) SpO2:  [97 %-100 %] 100 % (03/12 0315) Weight:  [153 lb (69.4 kg)-155 lb 4.8 oz (70.444 kg)] 155 lb 4.8 oz (70.444 kg) (03/12 0315) Physical Exam: Gen: NAD, alert, cooperative with exam  HEENT: NCAT,  CV: RRR, good S1/S2, no murmur  Resp: CTABL, no wheezes, non-labored  Abd: soft, mild tenderness to palpation over lower abd over her transplanted kidney, no cva tenderness  Ext: No edema, warm  Neuro: Alert and oriented,  normal speech   Laboratory:  Recent Labs Lab 11/01/13 1835 11/02/13 0620  WBC 7.2 5.7  HGB 10.8* 9.3*  HCT 31.2* 28.0*  PLT 401* 352    Recent Labs Lab 11/01/13 1835 11/02/13 0620  NA 135* 139  K 3.7 3.6*  CL 96 106  CO2 19 15*  BUN 89* 74*  CREATININE 3.05* 2.46*  CALCIUM 9.8 9.1  PROT 8.1  --   BILITOT 0.3  --   ALKPHOS 135*  --   ALT 58*  --   AST 58*  --   GLUCOSE 91 100*   Urinalysis    Component Value Date/Time   COLORURINE YELLOW 11/01/2013 1828   APPEARANCEUR CLOUDY* 11/01/2013 1828   LABSPEC 1.017 11/01/2013 1828   PHURINE 5.0 11/01/2013 1828   GLUCOSEU NEGATIVE 11/01/2013 1828   HGBUR MODERATE* 11/01/2013 1828   HGBUR moderate 02/03/2010 1321   BILIRUBINUR NEGATIVE 11/01/2013  [place] negative 05/31/2013 0920   KETONESUR NEGATIVE 11/01/2013 1828   PROTEINUR 30* 11/01/2013 1828   UROBILINOGEN 0.2 11/01/2013 1828   UROBILINOGEN 0.2 05/31/2013 0920   NITRITE NEGATIVE 11/01/2013 1828   NITRITE negative 05/31/2013 0920   LEUKOCYTESUR LARGE* 11/01/2013 1828   Imaging/Diagnostic Tests:   Rosemarie Ax, MD 11/02/2013, 9:29 AM PGY-1, [place] Intern pager: [phone], text pages welcome

## 2013-11-02 NOTE — Consult Note (Signed)
Reason for Consult:AKI/CKD in transplant patient Referring Physician: [name] is an 39 y.o. female.  HPI: Pt is a 38yo AAF with PMH sig for ESRD due to HTN who is s/p DDKT 11-Oct-2011 at [facility] with some CKD and baseline Scr of 1.8-2 post-operatively who was admitted on 11/01/13 with 7 day h/o N/V/Abd pain/, dysuria and now found to have + urine culture for E. Coli consistent with the diagnosis of pyelonephritis.  She was admitted for IV Abx, IVF's and we were asked to see the patient to further evaluate her AKI/CKD and manage her immunosuppressive therapy.  The trend in Scr is seen below.  Her most recent Scr was 2.11 on 09/20/13.  Trend in Creatinine:  Creatinine, Ser  Date/Time Value Ref Range Status  11/02/2013  6:20 AM 2.46* 0.50 - 1.10 mg/dL Final  11/01/2013  6:35 PM 3.05* 0.50 - 1.10 mg/dL Final  02/03/2010  3:25 PM 12.05* 0.40-1.20 mg/dL Final  09/04/2009  9:26 AM 9.0   Final  05/20/2009  1:14 PM 8.88* 0.4 - 1.2 mg/dL Final  05/06/2009 12:57 PM 8.83* 0.4 - 1.2 mg/dL Final  03/20/2009  2:00 PM 9.75* 0.4 - 1.2 mg/dL Final  01/02/2009  1:30 PM 10.60* 0.4 - 1.2 mg/dL Final  01/13/2008  5:32 AM 3.89*  Final  01/12/2008  5:51 AM 3.47*  Final  01/11/2008  5:58 AM 3.52*  Final  01/09/2008  5:30 AM 3.66*  Final  01/09/2008  5:30 AM 3.64*  Final  01/06/2008  5:48 AM 3.48*  Final  01/05/2008  5:05 AM 3.42*  Final  01/04/2008  5:47 AM 3.39*  Final  01/02/2008  5:05 AM 3.27*  Final  12/30/2007  5:27 AM 3.59*  Final  12/29/2007  5:50 AM 3.49*  Final  12/28/2007  5:50 AM 3.52*  Final  12/26/2007  5:47 AM 3.50*  Final  12/23/2007  6:40 AM 3.54*  Final  12/22/2007  5:59 AM 3.40*  Final  12/21/2007  5:00 AM 3.54*  Final  12/19/2007  5:45 AM 3.59*  Final  12/19/2007  5:45 AM 3.59*  Final  12/16/2007  6:00 AM 3.36*  Final  12/15/2007  7:05 AM 3.48*  Final  12/12/2007  5:10 AM 3.20*  Final  12/09/2007  6:02 AM 3.48*  Final  12/08/2007  7:10 AM 3.43*  Final  12/07/2007  5:20 PM 3.36*  Final    PMH:   Past  Medical History  Diagnosis Date  . Dialysis patient   . Hypertension   . History of renal transplant 11-Oct-2011  . GERD (gastroesophageal reflux disease)     PSH:   Past Surgical History  Procedure Laterality Date  . Diaylsis shunt    . Kidney transplant  10/11/2011    deceased donor kidney    Allergies: No Known Allergies  Medications:   Prior to Admission medications   Medication Sig Start Date End Date Taking? Authorizing Provider  imiquimod (ALDARA) 5 % cream Apply topically 3 (three) times a week. 07/27/13  Yes Dickie La, MD  labetalol (NORMODYNE) 300 MG tablet Take 300 mg by mouth daily.   Yes Historical Provider, MD  magnesium chloride (SLOW-MAG) 64 MG TBEC Take 2 tablets by mouth 2 (two) times daily.   Yes Historical Provider, MD  mycophenolate (MYFORTIC) 180 MG EC tablet Take 360 mg by mouth 2 (two) times daily.    Yes Historical Provider, MD  omeprazole (PRILOSEC) 20 MG capsule  04/05/12  Yes Historical Provider, MD  predniSONE (DELTASONE)  5 MG tablet Take 10 mg by mouth daily.    Yes Historical Provider, MD  sulfamethoxazole-trimethoprim (BACTRIM,SEPTRA) 400-80 MG per tablet Take 1 tablet by mouth See admin instructions. Monday Wednesday Friday 05/31/12  Yes Historical Provider, MD  tacrolimus (PROGRAF) 0.5 MG capsule Take 0.5 mg by mouth 2 (two) times daily. Total of 1.5mg  BID   Yes Historical Provider, MD  tacrolimus (PROGRAF) 1 MG capsule Take 1 mg by mouth 2 (two) times daily. Total of 1.5mg  BID   Yes Historical Provider, MD    Inpatient medications: . ciprofloxacin  500 mg Oral Daily  . heparin  5,000 Units Subcutaneous 3 times per day  . magnesium chloride  2 tablet Oral BID  . mycophenolate  360 mg Oral BID  . pantoprazole  40 mg Oral Daily  . predniSONE  10 mg Oral Q breakfast  . [START ON 11/03/2013] sulfamethoxazole-trimethoprim  1 tablet Oral Q M,W,F  . tacrolimus  1.5 mg Oral BID    Discontinued Meds:   Medications Discontinued During This Encounter   Medication Reason  . labetalol (NORMODYNE) 200 MG tablet Change in therapy  . metroNIDAZOLE (FLAGYL) 500 MG tablet Patient has not taken in last 30 days  . metroNIDAZOLE (FLAGYL) 500 MG tablet Patient has not taken in last 30 days  . metroNIDAZOLE (METROGEL VAGINAL) 0.75 % vaginal gel Patient has not taken in last 30 days  . imiquimod (ALDARA) 5 % cream   . tacrolimus (PROGRAF) capsule 0.5 mg   . tacrolimus (PROGRAF) capsule 1 mg   . magnesium chloride (SLOW-MAG) 64 MG SR tablet 128 mg   . tacrolimus (PROGRAF) capsule 0.5 mg Dose change  . tacrolimus (PROGRAF) capsule 1 mg Dose change    Social History:  reports that she has never smoked. She does not have any smokeless tobacco history on file. She reports that she does not drink alcohol or use illicit drugs.  Family History:   Family History  Problem Relation Age of Onset  . Diabetes Father   . Diabetes Paternal Aunt   . Alcohol abuse Paternal Uncle   . Cancer Maternal Grandmother     breast  . Diabetes Paternal Grandmother   . Alcohol abuse Paternal Grandmother   . Alcohol abuse Paternal Grandfather     Pertinent items are noted in HPI. Weight change:   Intake/Output Summary (Last 24 hours) at 11/02/13 1329 Last data filed at 11/02/13 1325  Gross per 24 hour  Intake   1075 ml  Output   1100 ml  Net    -25 ml   BP 117/76  Pulse 71  Temp(Src) 98 F (36.7 C) (Oral)  Resp 14  Ht 5\' 7"  (1.702 m)  Wt 70.444 kg (155 lb 4.8 oz)  BMI 24.32 kg/m2  SpO2 100%  LMP 10/25/2013 Filed Vitals:   11/02/13 0130 11/02/13 0230 11/02/13 0315 11/02/13 1322  BP: 126/77  110/69 117/76  Pulse:  75 70 71  Temp:   98.3 F (36.8 C) 98 F (36.7 C)  TempSrc:   Oral Oral  Resp:   18 14  Height:   5\' 7"  (1.702 m)   Weight:   70.444 kg (155 lb 4.8 oz)   SpO2:  100% 100% 100%     General appearance: alert, cooperative and no distress Head: Normocephalic, without obvious abnormality, atraumatic Neck: no adenopathy, no carotid bruit, no  JVD, supple, symmetrical, trachea midline and thyroid not enlarged, symmetric, no tenderness/mass/nodules Resp: clear to auscultation bilaterally Cardio: regular rate  and rhythm, S1, S2 normal, no murmur, click, rub or gallop GI: soft, non-tender; bowel sounds normal; no masses,  no organomegaly and renal allograft in RLQ nontender, no bruits Extremities: extremities normal, atraumatic, no cyanosis or edema and R FA AVF +T/B  Labs: Basic Metabolic Panel:  Recent Labs Lab 11/01/13 1835 11/02/13 0620  NA 135* 139  K 3.7 3.6*  CL 96 106  CO2 19 15*  GLUCOSE 91 100*  BUN 89* 74*  CREATININE 3.05* 2.46*  ALBUMIN 3.6  --   CALCIUM 9.8 9.1   Liver Function Tests:  Recent Labs Lab 11/01/13 1835  AST 58*  ALT 58*  ALKPHOS 135*  BILITOT 0.3  PROT 8.1  ALBUMIN 3.6   No results found for this basename: LIPASE, AMYLASE,  in the last 168 hours No results found for this basename: AMMONIA,  in the last 168 hours CBC:  Recent Labs Lab 11/01/13 1835 11/02/13 0620  WBC 7.2 5.7  NEUTROABS 5.3  --   HGB 10.8* 9.3*  HCT 31.2* 28.0*  MCV 88.6 88.6  PLT 401* 352   PT/INR: @LABRCNTIP (inr:5) Cardiac Enzymes: )No results found for this basename: CKTOTAL, CKMB, CKMBINDEX, TROPONINI,  in the last 168 hours CBG: No results found for this basename: GLUCAP,  in the last 168 hours  Iron Studies: No results found for this basename: IRON, TIBC, TRANSFERRIN, FERRITIN,  in the last 168 hours  Xrays/Other Studies: No results found.   Assessment/Plan: 1.  AKI/CKD in setting of volume depletion and hypotension.  Likely pre-renal (low FeNa) vs ischemic ATN, not consistent with acute rejection.  Her UOP and Scr have improved with IVF's.  Cont with current management and immunosuppressive agents.  Agree with holding bactrim until tomorrow 2. E. Coli pyelonephritis- on cipro per Primary service. 3. 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  Baseline Scr has been 1.8-2.  Cont with  current meds and follow. 4. Anemia- normocytic.  Drop from baseline of 11.1.  ?acute illness vs. GIB.  Did have diarrhea but no hematechezia/melena/BRBPR.  Cont to follow 5. Immunosuppressive therapy- cont with prograf 1.5mg  bid, myfortic 360 bid, and prednisone 5mg  qd 6. Hypomagnesemia- will check Mg level in am 7. HTN- stable   Candace Griffin A 11/02/2013, 1:29 PM

## 2013-11-02 NOTE — ED Provider Notes (Signed)
CSN: [id number]     Arrival date & time 11/01/13  1743 History   First MD Initiated Contact with Patient 11/01/13 2301     Chief Complaint  Patient presents with  . Emesis     (Consider location/radiation/quality/duration/timing/severity/associated sxs/prior Treatment) HPI Patient is a recipient of a deceased donor kidney he presents the emergency department with 9 days of nausea vomiting and diarrhea.  No recent antibiotics.  She's been compliant with her medications.  She states she feels extremely weak at this time.  She reports dysuria and urinary frequency or the past 24-48 hours.  No hematemesis.  No melena or hematochezia.  No recent sick contacts.  2 weeks ago she states that her creatinine was 2.3 and her nephrologist was very happy with all her numbers.  No fevers or chills.  Myalgias.   Past Medical History  Diagnosis Date  . Dialysis patient   . Hypertension   . History of renal transplant 10-14-11   Past Surgical History  Procedure Laterality Date  . Diaylsis shunt    . Kidney transplant  October 14, 2011    deceased donor kidney   Family History  Problem Relation Age of Onset  . Diabetes Father   . Diabetes Paternal Aunt   . Alcohol abuse Paternal Uncle   . Cancer Maternal Grandmother     breast  . Diabetes Paternal Grandmother   . Alcohol abuse Paternal Grandmother   . Alcohol abuse Paternal Grandfather    History  Substance Use Topics  . Smoking status: Never Smoker   . Smokeless tobacco: Not on file  . Alcohol Use: No   OB History   Grav Para Term Preterm Abortions TAB SAB Ect Mult Living   3         3     Review of Systems  All other systems reviewed and are negative.      Allergies  Review of patient's allergies indicates no known allergies.  Home Medications   Current Outpatient Rx  Name  Route  Sig  Dispense  Refill  . imiquimod (ALDARA) 5 % cream   Topical   Apply topically 3 (three) times a week.         . labetalol (NORMODYNE) 300 MG  tablet   Oral   Take 300 mg by mouth daily.         . magnesium chloride (SLOW-MAG) 64 MG TBEC   Oral   Take 2 tablets by mouth 2 (two) times daily.         . mycophenolate (MYFORTIC) 180 MG EC tablet   Oral   Take 360 mg by mouth 2 (two) times daily.          Marland Kitchen omeprazole (PRILOSEC) 20 MG capsule               . predniSONE (DELTASONE) 5 MG tablet   Oral   Take 10 mg by mouth daily.          Marland Kitchen sulfamethoxazole-trimethoprim (BACTRIM,SEPTRA) 400-80 MG per tablet   Oral   Take 1 tablet by mouth See admin instructions. Monday Wednesday Friday         . tacrolimus (PROGRAF) 0.5 MG capsule   Oral   Take 0.5 mg by mouth daily.         . tacrolimus (PROGRAF) 1 MG capsule   Oral   Take 1 mg by mouth at bedtime.           BP 116/74  Pulse 64  Temp(Src) 97.7 F (36.5 C)  Resp 18  Ht 5\' 6"  (1.676 m)  Wt 153 lb (69.4 kg)  BMI 24.71 kg/m2  SpO2 97%  LMP 10/25/2013 Physical Exam  Nursing note and vitals reviewed. Constitutional: She is oriented to person, place, and time. She appears well-developed and well-nourished. No distress.  HENT:  Head: Normocephalic and atraumatic.  Dry mucous membranes  Eyes: EOM are normal.  Neck: Normal range of motion.  Cardiovascular: Normal rate, regular rhythm and normal heart sounds.   Pulmonary/Chest: Effort normal and breath sounds normal.  Abdominal: Soft. She exhibits no distension. There is no tenderness.  Musculoskeletal: Normal range of motion.  Neurological: She is alert and oriented to person, place, and time.  Skin: Skin is warm and dry.  Psychiatric: She has a normal mood and affect. Judgment normal.    ED Course  Procedures (including critical care time) Labs Review Labs Reviewed  URINALYSIS, ROUTINE W REFLEX MICROSCOPIC - Abnormal; Notable for the following:    APPearance CLOUDY (*)    Hgb urine dipstick MODERATE (*)    Protein, ur 30 (*)    Leukocytes, UA LARGE (*)    All other components within  normal limits  CBC WITH DIFFERENTIAL - Abnormal; Notable for the following:    RBC 3.52 (*)    Hemoglobin 10.8 (*)    HCT 31.2 (*)    Platelets 401 (*)    All other components within normal limits  COMPREHENSIVE METABOLIC PANEL - Abnormal; Notable for the following:    Sodium 135 (*)    BUN 89 (*)    Creatinine, Ser 3.05 (*)    AST 58 (*)    ALT 58 (*)    Alkaline Phosphatase 135 (*)    GFR calc non Af Amer 18 (*)    GFR calc Af Amer 21 (*)    All other components within normal limits  URINE MICROSCOPIC-ADD ON - Abnormal; Notable for the following:    Squamous Epithelial / LPF FEW (*)    Bacteria, UA MANY (*)    All other components within normal limits  URINE CULTURE  PREGNANCY, URINE   Imaging Review No results found.   EKG Interpretation None      MDM   Final diagnoses:  Renal failure  Dehydration  Vomiting  Diarrhea  Urinary tract infection    Patient presents with dehydration and worsening renal function.  This is likely secondary to dehydration.  Also with urinary tract infection. No pain over her transplanted kidney.  Creatinine today is 3 in her BUN is 89.  She feels much better after IV fluids but I think she will benefit from ongoing IV hydration in the hospital with recheck of her BUN and creatinine in the morning.  Rocephin given for urinary tract infection.  Urine culture sent.    Hoy Morn, MD 11/02/13 [phone]

## 2013-11-02 NOTE — Progress Notes (Signed)
FMTS Attending Daily Note:  Annabell Sabal MD  [phone] pager  Family Practice pager:  [phone] I have discussed this patient with the resident Dr. Raeford Razor and attending physician Dr. Nori Riis.  I agree with their findings, assessment, and care plan

## 2013-11-03 DIAGNOSIS — I10 Essential (primary) hypertension: Secondary | ICD-10-CM

## 2013-11-03 DIAGNOSIS — D649 Anemia, unspecified: Secondary | ICD-10-CM | POA: Diagnosis not present

## 2013-11-03 DIAGNOSIS — N179 Acute kidney failure, unspecified: Secondary | ICD-10-CM | POA: Diagnosis not present

## 2013-11-03 DIAGNOSIS — Z940 Kidney transplant status: Secondary | ICD-10-CM

## 2013-11-03 DIAGNOSIS — N12 Tubulo-interstitial nephritis, not specified as acute or chronic: Secondary | ICD-10-CM

## 2013-11-03 DIAGNOSIS — A498 Other bacterial infections of unspecified site: Secondary | ICD-10-CM | POA: Diagnosis not present

## 2013-11-03 DIAGNOSIS — N390 Urinary tract infection, site not specified: Secondary | ICD-10-CM | POA: Diagnosis not present

## 2013-11-03 DIAGNOSIS — N19 Unspecified kidney failure: Secondary | ICD-10-CM

## 2013-11-03 LAB — BASIC METABOLIC PANEL
BUN: 49 mg/dL — ABNORMAL HIGH (ref 6–23)
CO2: 16 mEq/L — ABNORMAL LOW (ref 19–32)
Calcium: 9.1 mg/dL (ref 8.4–10.5)
Chloride: 112 mEq/L (ref 96–112)
Creatinine, Ser: 1.89 mg/dL — ABNORMAL HIGH (ref 0.50–1.10)
GFR calc Af Amer: 38 mL/min — ABNORMAL LOW (ref >90)
GFR calc non Af Amer: 33 mL/min — ABNORMAL LOW (ref >90)
Glucose, Bld: 115 mg/dL — ABNORMAL HIGH (ref 70–99)
Potassium: 4.5 mEq/L (ref 3.7–5.3)
Sodium: 143 mEq/L (ref 137–147)

## 2013-11-03 LAB — CBC
HCT: 28.2 % — ABNORMAL LOW (ref 36.0–46.0)
Hemoglobin: 9.4 g/dL — ABNORMAL LOW (ref 12.0–15.0)
MCH: 29.8 pg (ref 26.0–34.0)
MCHC: 33.3 g/dL (ref 30.0–36.0)
MCV: 89.5 fL (ref 78.0–100.0)
Platelets: 366 10*3/uL (ref 150–400)
RBC: 3.15 MIL/uL — ABNORMAL LOW (ref 3.87–5.11)
RDW: 13.2 % (ref 11.5–15.5)
WBC: 7.3 10*3/uL (ref 4.0–10.5)

## 2013-11-03 LAB — MAGNESIUM: Magnesium: 2.1 mg/dL (ref 1.5–2.5)

## 2013-11-03 MED ORDER — CIPROFLOXACIN HCL 500 MG PO TABS: 500.0000 mg | ORAL_TABLET | Freq: Every day | ORAL | Status: DC

## 2013-11-03 MED ORDER — ONDANSETRON HCL 4 MG PO TABS: 4.0000 mg | ORAL_TABLET | Freq: Four times a day (QID) | ORAL | Status: DC | PRN

## 2013-11-03 NOTE — Clinical Documentation Improvement (Signed)
Presents with ARF likely secondary to Nausea, Vomiting and Dehydration; EColi Pyelonephritis. Patient with history of ESRD secondary to HTN; is S/P Renal Transplant. During this current admission:    Patient's Creatinine's having been running from 3.05, 2.46 and 1.89  Black female  GFR's ranging from 21 to 72 this admit   Please clarify the stage of CKD the patient has this admission and render your opinion in next progress note and discharge summary.  _______CKD Stage I - GFR > OR = 90 _______CKD Stage II - GFR 60-80 _______CKD Stage III - GFR 30-59 _______CKD Stage IV - GFR 15-29 _______CKD Stage V - GFR < 15 _______ESRD (End Stage Renal Disease) _______Other Condition  Supporting Information:  Risk Factors:  Signs & Symptoms:  Diagnostics:  Treatment:  Thank You, Zoila Shutter ,RN Clinical Documentation Specialist:  [phone]  [place] Information Management

## 2013-11-03 NOTE — Progress Notes (Signed)
S: Feels well.  No N/V  No more pain O:BP 145/89  Pulse 60  Temp(Src) 97.6 F (36.4 C) (Oral)  Resp 15  Ht 5\' 7"  (1.702 m)  Wt 70.444 kg (155 lb 4.8 oz)  BMI 24.32 kg/m2  SpO2 99%  LMP 10/25/2013  Intake/Output Summary (Last 24 hours) at 11/03/13 1020 Last data filed at 11/03/13 0600  Gross per 24 hour  Intake   4001 ml  Output    400 ml  Net   3601 ml   Weight change:  [id number] and alert CVS:RRR Resp:clear Abd: + BS NTND Tx RLQ NT.  No CVA tenderness Ext:no edema Rt AVF + bruit NEURO:CN I Ox3 M&SI   . ciprofloxacin  500 mg Oral Daily  . heparin  5,000 Units Subcutaneous 3 times per day  . magnesium chloride  2 tablet Oral BID  . mycophenolate  360 mg Oral BID  . pantoprazole  40 mg Oral Daily  . predniSONE  10 mg Oral Q breakfast  . sulfamethoxazole-trimethoprim  1 tablet Oral Q M,W,F  . tacrolimus  1.5 mg Oral BID   No results found. BMET    Component Value Date/Time   NA 143 11/03/2013 0349   K 4.5 11/03/2013 0349   CL 112 11/03/2013 0349   CO2 16* 11/03/2013 0349   GLUCOSE 115* 11/03/2013 0349   BUN 49* 11/03/2013 0349   CREATININE 1.89* 11/03/2013 0349   CREATININE 3.59* 12/19/2007 0645   CALCIUM 9.1 11/03/2013 0349   GFRNONAA 33* 11/03/2013 0349   GFRAA 38* 11/03/2013 0349   CBC    Component Value Date/Time   WBC 7.3 11/03/2013 0349   RBC 3.15* 11/03/2013 0349   HGB 9.4* 11/03/2013 0349   HCT 28.2* 11/03/2013 0349   PLT 366 11/03/2013 0349   MCV 89.5 11/03/2013 0349   MCH 29.8 11/03/2013 0349   MCHC 33.3 11/03/2013 0349   RDW 13.2 11/03/2013 0349   LYMPHSABS 1.1 11/01/2013 1835   MONOABS 0.5 11/01/2013 1835   EOSABS 0.2 11/01/2013 1835   BASOSABS 0.0 11/01/2013 1835     Assessment: 1. Acute on CKD 3 in renal transplant, improved Scr at baseline 2. E coli UTI on cipro  Plan: 1.  She is back to baseline physically. Renal fx at baseline.  She wants to go home and can be DC'd from renal standpoint to finish up 2 week course of cipro.   Candace Griffin

## 2013-11-03 NOTE — H&P (Signed)
FMTS Attending Admission Note: Dorcas Mcmurray MD [phone] pager office [phone] I  have seen and examined this patient, reviewed their chart. I have discussed this patient with the resident. I agree with the resident's findings, assessment and care plan. Discussed with her absolute need to seek medical care early as she has now been   Off her anti-rejection drugs 10 days.

## 2013-11-03 NOTE — Progress Notes (Signed)
FMTS Attending Daily Note:  Annabell Sabal MD  [phone] pager  Family Practice pager:  [phone] I have seen and examined this patient and have reviewed their chart. I have discussed this patient with the resident. I agree with the resident's findings, assessment and care plan.  Additionally:  Greatly improved.  [place] for DC home today  Alveda Reasons, MD 11/03/2013 4:22 PM

## 2013-11-03 NOTE — Progress Notes (Signed)
[facility] PCP Note:   Came to visit patient. She feels much better. She has been eating and drinking. Now on cipro oral. IV fluids off. No fever since admission. Normal WBC. Urine culture: E-coli sensitive to cipro.  Seems like she is about to be discharged today.   Patient works at [employer] and asked when she could go back to work. Probably best to wait until second half of next week or until she is feeling back to her baseline.   Greatly appreciate the wonderful care provided by the [facility] team as well as by the renal team.  Will gladly see patient for follow up appointment.   Liam Graham, PGY-3 Family Medicine Resident

## 2013-11-03 NOTE — Progress Notes (Signed)
Family Medicine Teaching Service Daily Progress Note Intern Pager: [phone]  Patient name: Candace Griffin Medical record number: [id number] Date of birth: 10/02/1975 Age: 38 y.o. Gender: female  Primary Care Provider: Liam Graham, MD Consultants: Nephrology  Code Status: full   Pt Overview and Major Events to Date:  3/12: admitted for pyelonephritis/complicated UTI   ABX/Culture CTX 3/11 Cipro 3/12>>  Urine cx 3/11>> e. Coli resistant to amp, bactrim  Assessment and Plan: Candace Griffin is a 38 y.o. female presenting with persistent nausea, vomiting, dysuria, and abdominal pain consistent with pyelonephritis . PMH is significant for ESRD s/p deceased donor kidney transplant 09/30/2011, HTN, and GERD.   # Pyelonephritis/complicated UTI - in the presence of immuno-suppression and renal transplant. Afebrile since admission. No CVA tenderness and nausea improving.  - Urine culture e. Coli resistant to amp, bactrim - oral ciprofloxacin 500 mg QD  - Regular diet as tolerated, IV zofran, IV NS at 125 ml/hr   # AKI on CKD Stage 3 s/p renal transplant in 09/2011: Followed by Dr. Justin Mend at [facility]. Likely related to dehydration related. Baseline 2.3 with Cr improving this am. Cr now 1.89 - FENa: 0.8:  indicating prerenal   - IV fluids and antibiotics as above  - appreciate renal consult and recommendations - Continue immunosuppressive therapy mycophenolate, tacrolimus, prednisone   # HTN: well controlled  - hold labetalol with volume depletion and only once daily dosing, restart if she becomes hypertensive   # Normocytic anemia: Hgb 10.5, unknown baseline; dropped to 9.3 yesterday, stable at 9.4 today - Likely anemia of chronic renal disease, monitor   # GERD: continue PPI   FEN/GI: NS at 125 ml/hr, regular diet  Prophylaxis: sub q heparin  Disposition: discharge today  Subjective:  Doing well this morning, no complaints of pain, fevers or chills.   Objective: Temp:   [97.6 F (36.4 C)-98.1 F (36.7 C)] 97.6 F (36.4 C) (03/13 0600) Pulse Rate:  [60-71] 60 (03/13 0600) Resp:  [14-16] 15 (03/13 0600) BP: (114-145)/(70-89) 145/89 mmHg (03/13 0600) SpO2:  [99 %-100 %] 99 % (03/13 0600) Physical Exam: Gen: NAD, alert, cooperative with exam  CV: RRR, good S1/S2, no murmur  Resp: CTABL, no wheezes, non-labored  Back: no CVA or spinal tenderness. Abd: soft, mild tenderness to palpation over lower abd over her transplanted kidney, no cva tenderness  Ext: No edema, warm  Neuro: Alert and oriented,  normal speech  Laboratory:  Recent Labs Lab 11/01/13 1835 11/02/13 0620 11/03/13 0349  WBC 7.2 5.7 7.3  HGB 10.8* 9.3* 9.4*  HCT 31.2* 28.0* 28.2*  PLT 401* 352 366    Recent Labs Lab 11/01/13 1835 11/02/13 0620 11/03/13 0349  NA 135* 139 143  K 3.7 3.6* 4.5  CL 96 106 112  CO2 19 15* 16*  BUN 89* 74* 49*  CREATININE 3.05* 2.46* 1.89*  CALCIUM 9.8 9.1 9.1  PROT 8.1  --   --   BILITOT 0.3  --   --   ALKPHOS 135*  --   --   ALT 58*  --   --   AST 58*  --   --   GLUCOSE 91 100* 115*   Urinalysis    Component Value Date/Time   COLORURINE YELLOW 11/01/2013 1828   APPEARANCEUR CLOUDY* 11/01/2013 1828   LABSPEC 1.017 11/01/2013 1828   PHURINE 5.0 11/01/2013 1828   GLUCOSEU NEGATIVE 11/01/2013 1828   HGBUR MODERATE* 11/01/2013 1828   HGBUR moderate 02/03/2010 1321   BILIRUBINUR  NEGATIVE 11/01/2013 1828   BILIRUBINUR negative 05/31/2013 0920   KETONESUR NEGATIVE 11/01/2013 1828   PROTEINUR 30* 11/01/2013 1828   UROBILINOGEN 0.2 11/01/2013 1828   UROBILINOGEN 0.2 05/31/2013 0920   NITRITE NEGATIVE 11/01/2013 1828   NITRITE negative 05/31/2013 0920   LEUKOCYTESUR LARGE* 11/01/2013 1828   Imaging/Diagnostic Tests:   Tawanna Sat, MD 11/03/2013, 8:13 AM PGY-1, [place] Intern pager: [phone], text pages welcome

## 2013-11-03 NOTE — Plan of Care (Signed)
Problem: Discharge Progression Outcomes Goal: Pain controlled with appropriate interventions Outcome: Not Applicable Date Met:  30/09/23 Patient has not had any pain medication on this hospital stay.

## 2013-11-03 NOTE — Discharge Summary (Signed)
[facility] Discharge Summary  Patient name: Candace Griffin Medical record number: [id number] Date of birth: 05/15/1976 Age: 38 y.o. Gender: female Date of Admission: 11/01/2013  Date of Discharge: 11/03/2013 Admitting Physician: Alveda Reasons, MD  Primary Care Provider: Liam Graham, MD Consultants: Nephrology  Indication for Hospitalization: Nausea, vomiting, dysuria  Discharge Diagnoses/Problem List:  UTI, e. Coli AKI on CKD stage 3; ESRD s/p kidney transplant 09/30/2011 Immunosuppressive therapy Hypertension Normocytic anemia GERD  Disposition: home  Discharge Condition: stable, improved  Brief Hospital Course:  Candace Griffin is a 38 y.o. female that was admitted for persistent nausea, vomiting, dysuria, abdominal pain. PMH significant for renal transplant in 2013, HTN, GERD. She was started on ceftriaxone in the ED for suspected pyelonephritis, UA significant for moderate hemoglobin and large leukocytes, creatinine of 3.05 (baseline 1.8-2). Nephrology was consulted for assistance of renal transplant in setting of recent inability to keep immunosuppressive therapy down due to emesis. Ceftriaxone was switched to ciprofloxacin and urine culture came back with e. Coli sensitive to ciprofloxacin, resistant to bactrim (which she was on prophylactically). Patient remained afebrile and creatinine trended down to 1.89, stable for discharge with continued 14 day course of ciprofloxacin.  Issues for Follow Up:  1. Bactrim prophylaxis: advised to continue this on discharge but may need to reconsider after culture results showed resistance 2. Normocytic Anemia: likely anemia of chronic disease, but consider iron panel and supplementation if needed  Significant Procedures: none  Significant Labs and Imaging:   Recent Labs Lab 11/01/13 1835 11/02/13 0620 11/03/13 0349  WBC 7.2 5.7 7.3  HGB 10.8* 9.3* 9.4*  HCT 31.2* 28.0* 28.2*  PLT 401* 352 366     Recent Labs Lab 11/01/13 1835 11/02/13 0620 11/03/13 0349  NA 135* 139 143  K 3.7 3.6* 4.5  CL 96 106 112  CO2 19 15* 16*  GLUCOSE 91 100* 115*  BUN 89* 74* 49*  CREATININE 3.05* 2.46* 1.89*  CALCIUM 9.8 9.1 9.1  MG  --   --  2.1  ALKPHOS 135*  --   --   AST 58*  --   --   ALT 58*  --   --   ALBUMIN 3.6  --   --    Magnesium 2.1 Urine sodium 22, urine creatinine 47.56  Urine culture: >100,000 colonies e. Coli resistant to ampicillin, bactrim; sensitive to cephalosporins, fluoroquinolones, gentamicin, macrobid, zosyn  Results/Tests Pending at Time of Discharge: none  Discharge Medications:    Medication List         ciprofloxacin 500 MG tablet  Commonly known as:  CIPRO  Take 1 tablet (500 mg total) by mouth daily.     imiquimod 5 % cream  Commonly known as:  ALDARA  Apply topically 3 (three) times a week.     labetalol 300 MG tablet  Commonly known as:  NORMODYNE  Take 300 mg by mouth daily.     magnesium chloride 64 MG Tbec SR tablet  Commonly known as:  SLOW-MAG  Take 2 tablets by mouth 2 (two) times daily.     mycophenolate 180 MG EC tablet  Commonly known as:  MYFORTIC  Take 360 mg by mouth 2 (two) times daily.     omeprazole 20 MG capsule  Commonly known as:  PRILOSEC     ondansetron 4 MG tablet  Commonly known as:  ZOFRAN  Take 1 tablet (4 mg total) by mouth every 6 (six) hours as needed for nausea.  predniSONE 5 MG tablet  Commonly known as:  DELTASONE  Take 10 mg by mouth daily.     sulfamethoxazole-trimethoprim 400-80 MG per tablet  Commonly known as:  BACTRIM,SEPTRA  Take 1 tablet by mouth See admin instructions. Monday Wednesday Friday     tacrolimus 1 MG capsule  Commonly known as:  PROGRAF  Take 1 mg by mouth 2 (two) times daily. Total of 1.5mg  BID     tacrolimus 0.5 MG capsule  Commonly known as:  PROGRAF  Take 0.5 mg by mouth 2 (two) times daily. Total of 1.5mg  BID        Discharge Instructions: Please refer to  Patient Instructions section of EMR for full details.  Patient was counseled important signs and symptoms that should prompt return to medical care, changes in medications, dietary instructions, activity restrictions, and follow up appointments.   Follow-Up Appointments: Follow-up Information   Follow up with Liam Graham, MD On 11/08/2013. (at 2pm, For hospital follow up)    Specialty:  Family Medicine   Contact information:   [address] [place] [address] [phone]       Tawanna Sat, MD 11/04/2013, 2:42 PM PGY-1, [place]

## 2013-11-03 NOTE — Progress Notes (Signed)
AVS discharge instructions were given and went over with patient. Patient was told to make sure she pickup her medications zofran and cipro from her pharmacy (perscriptions  were sent electronically). Patient stated that she did not have any questions. Now waiting for her family member to come and pick her up.

## 2013-11-03 NOTE — Discharge Instructions (Addendum)
You were admitted for UTI. Your urine culture grew a bacteria called e. Coli, which is the most common bacteria to cause UTI. You were started on antibiotics and will go home with 12 more days of ciprofloxacin.  It is very important to take your immunosuppressive (anti-rejection) medications. If you are unable to keep these down for more than a day, please call your doctor.

## 2013-11-08 ENCOUNTER — Inpatient Hospital Stay: Payer: Medicare Other | Admitting: Family Medicine

## 2013-11-08 NOTE — Discharge Summary (Signed)
Family Medicine Teaching Service  Discharge Note : Attending Jeff Devi Hopman MD Pager [phone] Inpatient Team Pager:  [phone]  I have reviewed this patient and the patient's chart and have discussed discharge planning with the resident at the time of discharge. I agree with the discharge plan as above.    

## 2013-11-15 ENCOUNTER — Ambulatory Visit (INDEPENDENT_AMBULATORY_CARE_PROVIDER_SITE_OTHER): Payer: Medicare Other | Admitting: Family Medicine

## 2013-11-15 ENCOUNTER — Encounter: Payer: Self-pay | Admitting: Family Medicine

## 2013-11-15 VITALS — BP 126/77 | HR 80 | Ht 67.0 in | Wt 156.0 lb

## 2013-11-15 DIAGNOSIS — N12 Tubulo-interstitial nephritis, not specified as acute or chronic: Secondary | ICD-10-CM | POA: Diagnosis not present

## 2013-11-15 MED ORDER — FLUCONAZOLE 150 MG PO TABS: 150.0000 mg | ORAL_TABLET | Freq: Once | ORAL | Status: DC

## 2013-11-15 NOTE — Assessment & Plan Note (Signed)
Symptoms resolved since discharge from the hospital.  -Finish Cipro course -Question of UTI prophylaxis: Patient has not had a urinary tract infection other than this one in over a year. Will hold on prophylaxis at this time. Patient is on Bactrim status post transplant. Urine culture was resistant to Bactrim. If were to treat prophylactically, Cipro or nitrofurantoin would be possible. - Diflucan times one and sent for reported yeast vaginitis status post antibiotics.

## 2013-11-15 NOTE — Patient Instructions (Signed)
Like you're feeling better. Finish the Cipro. As of now we will hold on putting you on prophylactic antibiotics but if you have another urinary tract infection we will be sure to consider it. You may also want ask Dr. Justin Mend what he thinks about this Signs of urinary tract infection include increased urinary frequency, burning when he urinates, abdominal pressure and pain, low back pain, nausea and vomiting. if you have any of these symptoms please come to the clinic

## 2013-11-15 NOTE — Progress Notes (Signed)
Patient ID: Candace Griffin    DOB: 1976-05-22, 38 y.o.   MRN: [id number] --- Subjective:  Candace Griffin is a 38 y.o.female with h/o kidney transplant on 09/30/2011, CKD3 who presents for hospital follow up.  Hospitalized on 11/01/2013 for pyelonephritis. She was treated with IV ceftriaxone which was switched to oral Cipro. She had an acute kidney injury at that time as well but creatinine had improved to 1.89, which is close to her baseline. Since her discharge she has been doing well. She denies any abdominal pain, nausea, vomiting, dysuria, increased urinary frequency. Denies any low back pain or fevers or chills or myalgias. She has been eating and drinking. She return to work last week and tolerated it well. She had diarrhea with the Cipro and started taking it once a day supposed to twice a day last week. She still has 2 doses left. She has not had diarrhea in a couple days. She reports having some yeastlike discharge after using the antibiotics.    ROS: see HPI Past Medical History: reviewed and updated medications and allergies. Social History: Tobacco: None  Objective: Filed Vitals:   11/15/13 0942  BP: 126/77  Pulse: 80    Physical Examination:   General appearance - alert, well appearing, and in no distress Chest - clear to auscultation, no wheezes, rales or rhonchi, symmetric air entry Heart - normal rate, regular rhythm, normal S1, S2, no murmurs, rubs, clicks or gallops Abdomen - soft, nontender, nondistended, no masses or organomegaly  Urine culture from 11/01/2013: Greater than 100,000 Escherichia coli resistant to ampicillin and Bactrim, sensitive to Cipro, ceftriaxone, Zosyn and nitrofurantoin  BMET    Component Value Date/Time   NA 143 11/03/2013 0349   K 4.5 11/03/2013 0349   CL 112 11/03/2013 0349   CO2 16* 11/03/2013 0349   GLUCOSE 115* 11/03/2013 0349   BUN 49* 11/03/2013 0349   CREATININE 1.89* 11/03/2013 0349   CREATININE 3.59* 12/19/2007 0645   CALCIUM 9.1 11/03/2013  0349   GFRNONAA 33* 11/03/2013 0349   GFRAA 38* 11/03/2013 0349   CBC    Component Value Date/Time   WBC 7.3 11/03/2013 0349   RBC 3.15* 11/03/2013 0349   HGB 9.4* 11/03/2013 0349   HCT 28.2* 11/03/2013 0349   PLT 366 11/03/2013 0349   MCV 89.5 11/03/2013 0349   MCH 29.8 11/03/2013 0349   MCHC 33.3 11/03/2013 0349   RDW 13.2 11/03/2013 0349   LYMPHSABS 1.1 11/01/2013 1835   MONOABS 0.5 11/01/2013 1835   EOSABS 0.2 11/01/2013 1835   BASOSABS 0.0 11/01/2013 1835

## 2013-11-17 ENCOUNTER — Encounter: Payer: Medicare Other | Admitting: Obstetrics & Gynecology

## 2014-01-02 ENCOUNTER — Other Ambulatory Visit: Payer: Self-pay | Admitting: Family Medicine

## 2014-01-25 ENCOUNTER — Other Ambulatory Visit: Payer: Self-pay | Admitting: Family Medicine

## 2014-01-26 DIAGNOSIS — D649 Anemia, unspecified: Secondary | ICD-10-CM | POA: Diagnosis not present

## 2014-01-26 DIAGNOSIS — N2581 Secondary hyperparathyroidism of renal origin: Secondary | ICD-10-CM | POA: Diagnosis not present

## 2014-01-26 DIAGNOSIS — Z940 Kidney transplant status: Secondary | ICD-10-CM | POA: Diagnosis not present

## 2014-02-19 DIAGNOSIS — N185 Chronic kidney disease, stage 5: Secondary | ICD-10-CM | POA: Diagnosis not present

## 2014-03-14 ENCOUNTER — Encounter (HOSPITAL_COMMUNITY): Payer: Self-pay | Admitting: Emergency Medicine

## 2014-03-14 ENCOUNTER — Emergency Department (HOSPITAL_COMMUNITY): Payer: Medicare Other

## 2014-03-14 ENCOUNTER — Emergency Department (HOSPITAL_COMMUNITY)
Admission: EM | Admit: 2014-03-14 | Discharge: 2014-03-14 | Disposition: A | Discharge status: Home / Self Care | Payer: Medicare Other | Attending: Emergency Medicine | Admitting: Emergency Medicine

## 2014-03-14 DIAGNOSIS — Z940 Kidney transplant status: Secondary | ICD-10-CM | POA: Diagnosis not present

## 2014-03-14 DIAGNOSIS — R5381 Other malaise: Secondary | ICD-10-CM | POA: Diagnosis present

## 2014-03-14 DIAGNOSIS — N730 Acute parametritis and pelvic cellulitis: Secondary | ICD-10-CM | POA: Diagnosis not present

## 2014-03-14 DIAGNOSIS — K219 Gastro-esophageal reflux disease without esophagitis: Secondary | ICD-10-CM | POA: Insufficient documentation

## 2014-03-14 DIAGNOSIS — I10 Essential (primary) hypertension: Secondary | ICD-10-CM | POA: Insufficient documentation

## 2014-03-14 DIAGNOSIS — N739 Female pelvic inflammatory disease, unspecified: Secondary | ICD-10-CM | POA: Diagnosis not present

## 2014-03-14 DIAGNOSIS — Z79899 Other long term (current) drug therapy: Secondary | ICD-10-CM | POA: Insufficient documentation

## 2014-03-14 DIAGNOSIS — A498 Other bacterial infections of unspecified site: Secondary | ICD-10-CM | POA: Insufficient documentation

## 2014-03-14 DIAGNOSIS — N390 Urinary tract infection, site not specified: Secondary | ICD-10-CM | POA: Diagnosis not present

## 2014-03-14 DIAGNOSIS — N949 Unspecified condition associated with female genital organs and menstrual cycle: Secondary | ICD-10-CM | POA: Diagnosis not present

## 2014-03-14 LAB — COMPREHENSIVE METABOLIC PANEL
ALT: 44 U/L — ABNORMAL HIGH (ref 0–35)
AST: 34 U/L (ref 0–37)
Albumin: 3.6 g/dL (ref 3.5–5.2)
Alkaline Phosphatase: 73 U/L (ref 39–117)
Anion gap: 15 (ref 5–15)
BUN: 29 mg/dL — ABNORMAL HIGH (ref 6–23)
CO2: 18 mEq/L — ABNORMAL LOW (ref 19–32)
Calcium: 9.4 mg/dL (ref 8.4–10.5)
Chloride: 97 mEq/L (ref 96–112)
Creatinine, Ser: 2.06 mg/dL — ABNORMAL HIGH (ref 0.50–1.10)
GFR calc Af Amer: 34 mL/min — ABNORMAL LOW (ref >90)
GFR calc non Af Amer: 30 mL/min — ABNORMAL LOW (ref >90)
Glucose, Bld: 127 mg/dL — ABNORMAL HIGH (ref 70–99)
Potassium: 3.8 mEq/L (ref 3.7–5.3)
Sodium: 130 mEq/L — ABNORMAL LOW (ref 137–147)
Total Bilirubin: 0.7 mg/dL (ref 0.3–1.2)
Total Protein: 7.9 g/dL (ref 6.0–8.3)

## 2014-03-14 LAB — CBC WITH DIFFERENTIAL/PLATELET
Basophils Absolute: 0 10*3/uL (ref 0.0–0.1)
Basophils Relative: 0 % (ref 0–1)
Eosinophils Absolute: 0 10*3/uL (ref 0.0–0.7)
Eosinophils Relative: 0 % (ref 0–5)
HCT: 36.3 % (ref 36.0–46.0)
Hemoglobin: 12.1 g/dL (ref 12.0–15.0)
Lymphocytes Relative: 3 % — ABNORMAL LOW (ref 12–46)
Lymphs Abs: 0.4 10*3/uL — ABNORMAL LOW (ref 0.7–4.0)
MCH: 30 pg (ref 26.0–34.0)
MCHC: 33.3 g/dL (ref 30.0–36.0)
MCV: 90.1 fL (ref 78.0–100.0)
Monocytes Absolute: 1.6 10*3/uL — ABNORMAL HIGH (ref 0.1–1.0)
Monocytes Relative: 15 % — ABNORMAL HIGH (ref 3–12)
Neutro Abs: 8.6 10*3/uL — ABNORMAL HIGH (ref 1.7–7.7)
Neutrophils Relative %: 82 % — ABNORMAL HIGH (ref 43–77)
Platelets: 177 10*3/uL (ref 150–400)
RBC: 4.03 MIL/uL (ref 3.87–5.11)
RDW: 13.1 % (ref 11.5–15.5)
WBC: 10.6 10*3/uL — ABNORMAL HIGH (ref 4.0–10.5)

## 2014-03-14 LAB — URINALYSIS, ROUTINE W REFLEX MICROSCOPIC
Bilirubin Urine: NEGATIVE
Glucose, UA: NEGATIVE mg/dL
Ketones, ur: NEGATIVE mg/dL
Nitrite: NEGATIVE
Protein, ur: 30 mg/dL — AB
Specific Gravity, Urine: 1.011 (ref 1.005–1.030)
Urobilinogen, UA: 0.2 mg/dL (ref 0.0–1.0)
pH: 5.5 (ref 5.0–8.0)

## 2014-03-14 LAB — WET PREP, GENITAL
Clue Cells Wet Prep HPF POC: NONE SEEN
Trich, Wet Prep: NONE SEEN
Yeast Wet Prep HPF POC: NONE SEEN

## 2014-03-14 LAB — POC URINE PREG, ED: Preg Test, Ur: NEGATIVE

## 2014-03-14 LAB — URINE MICROSCOPIC-ADD ON

## 2014-03-14 LAB — HIV ANTIBODY (ROUTINE TESTING W REFLEX): HIV 1&2 Ab, 4th Generation: NONREACTIVE

## 2014-03-14 LAB — I-STAT CG4 LACTIC ACID, ED: Lactic Acid, Venous: 1.54 mmol/L (ref 0.5–2.2)

## 2014-03-14 IMAGING — US US PELVIS COMPLETE
1 series · 14 of 25 positions shown · non-contrast
Comparison: None

CLINICAL DATA: Tubo-ovarian abscess.  RIGHT-sided pelvic pain.



[Series 1: us pelvis complete · 0.24mm/px · 14 of 67 slices shown]
[im 1/67]
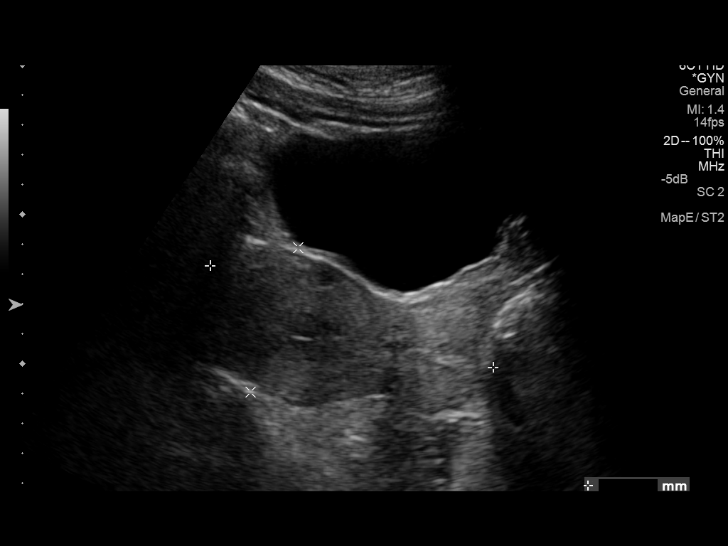
[im 6/67]
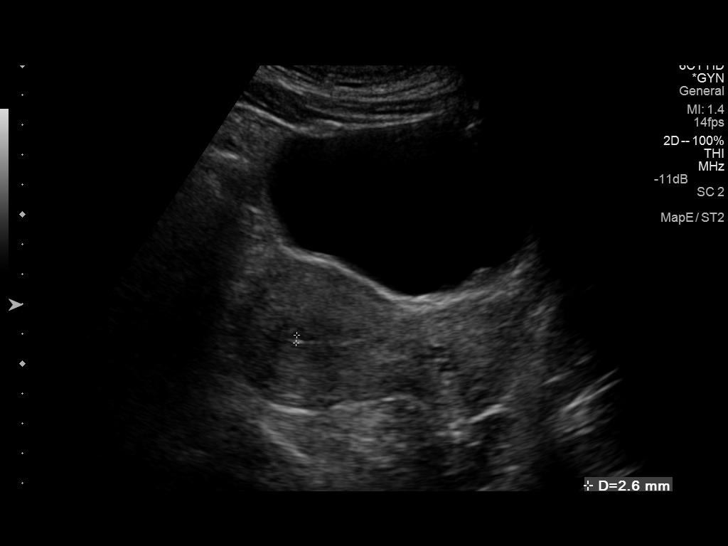
[im 12/67]
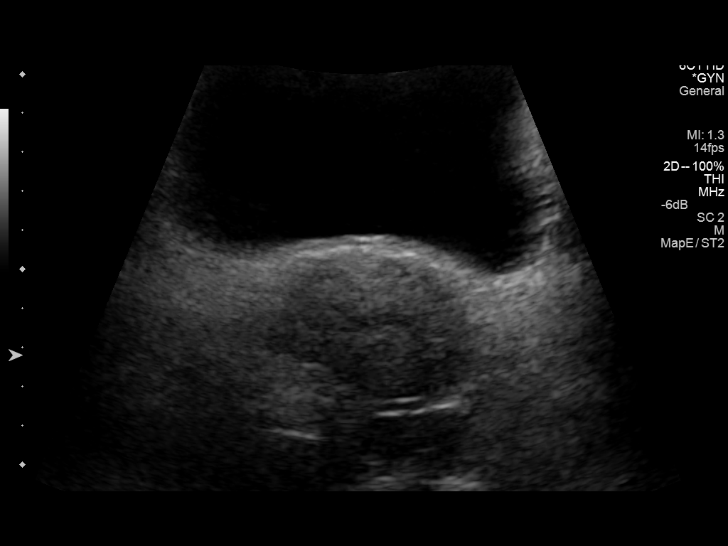
[im 17/67]
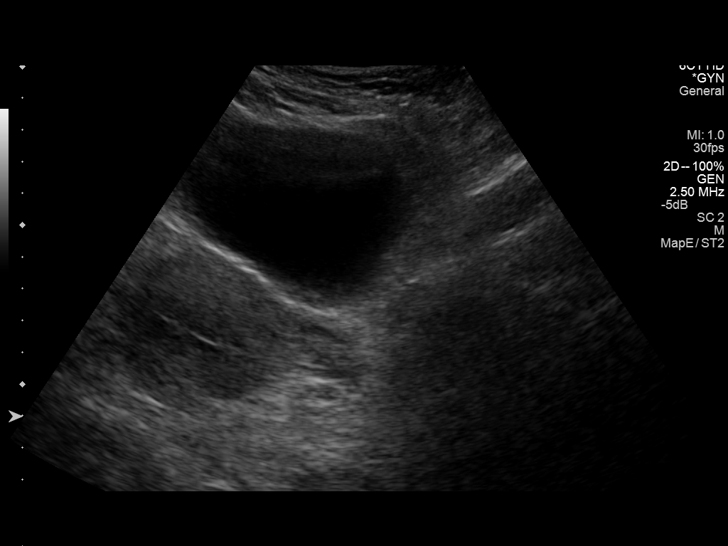
[im 23/67]
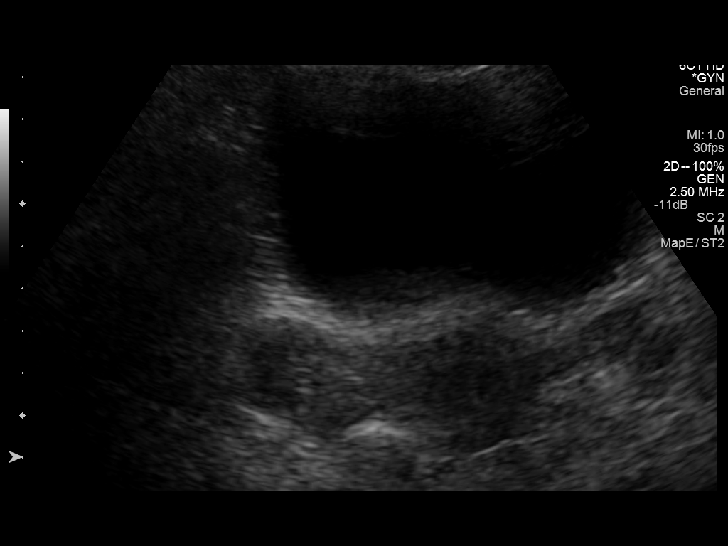
[im 25/67]
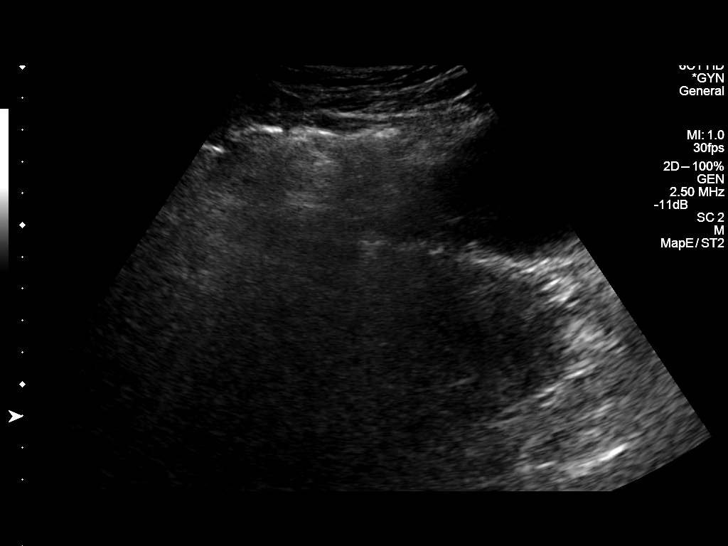
[im 31/67]
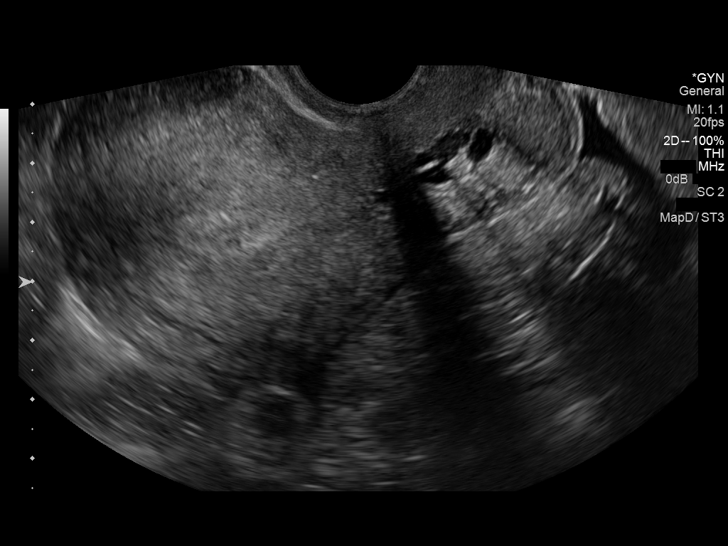
[im 36/67]
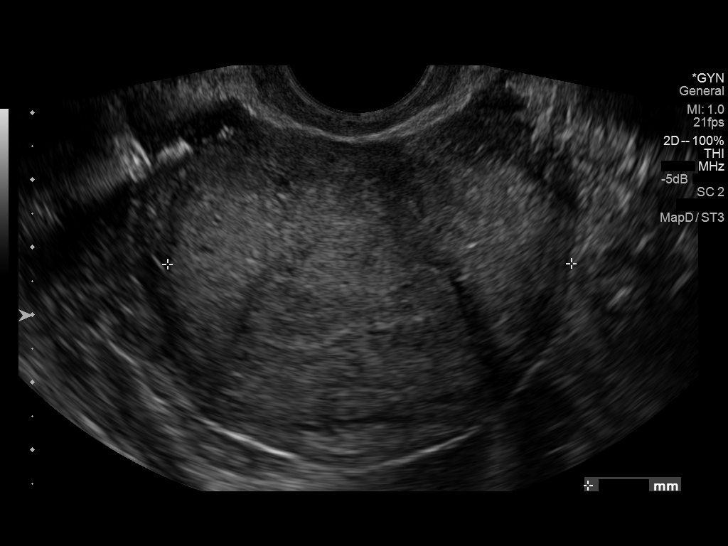
[im 42/67]
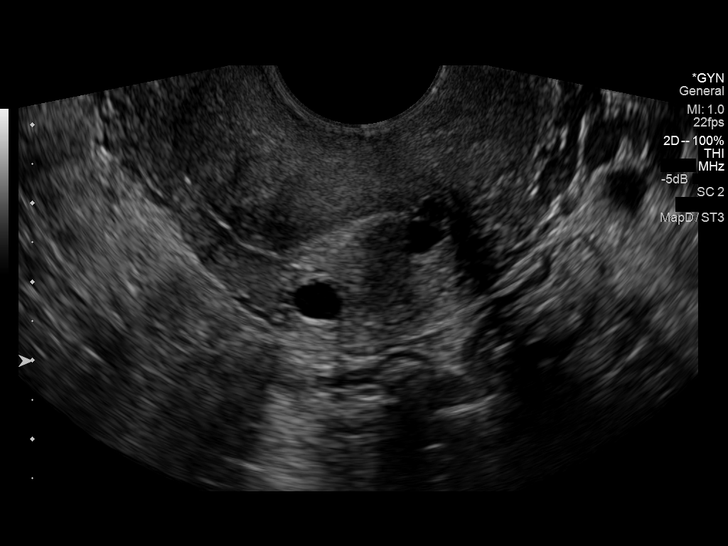
[im 45/67]
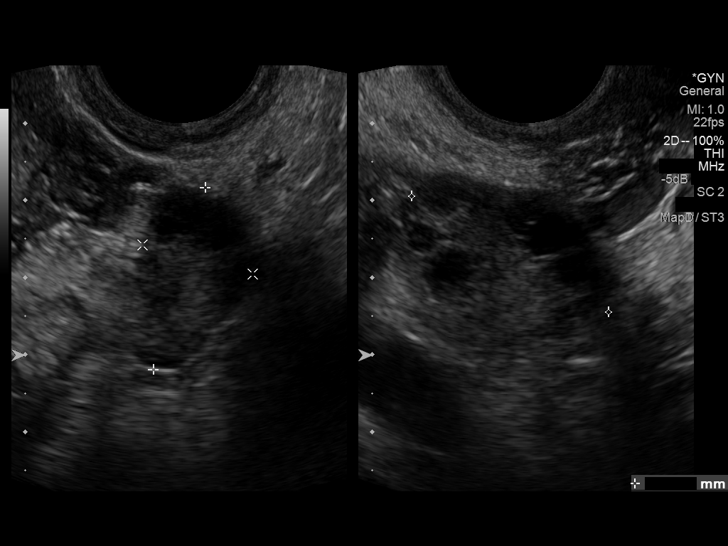
[im 50/67]
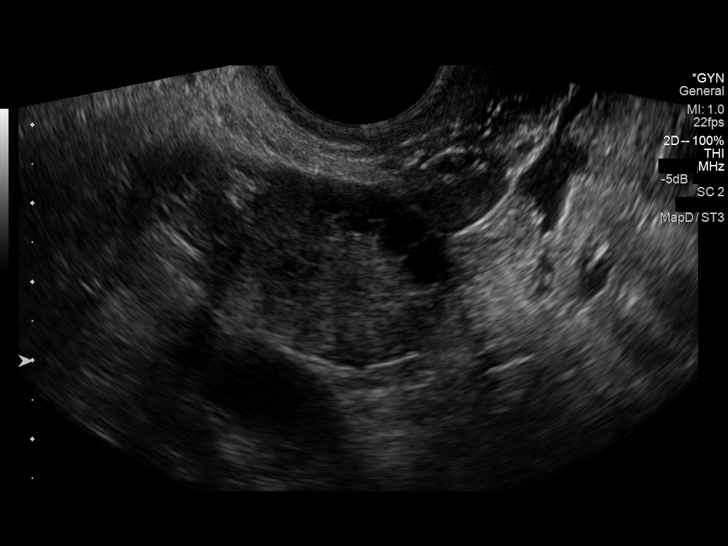
[im 56/67]
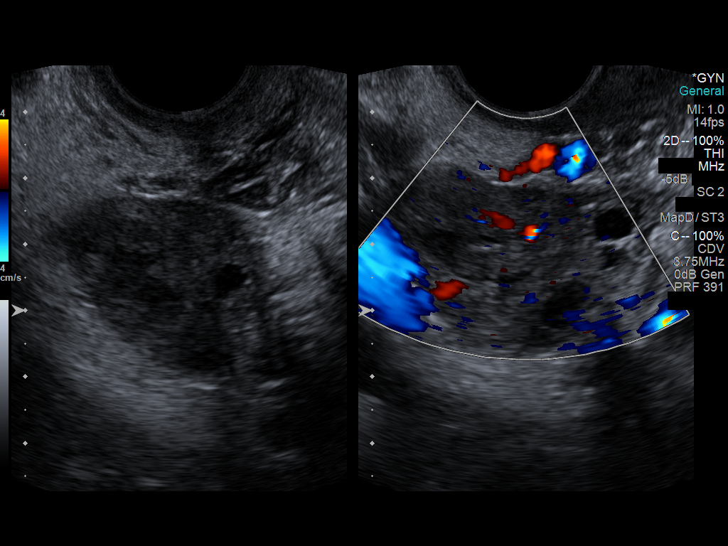
[im 61/67]
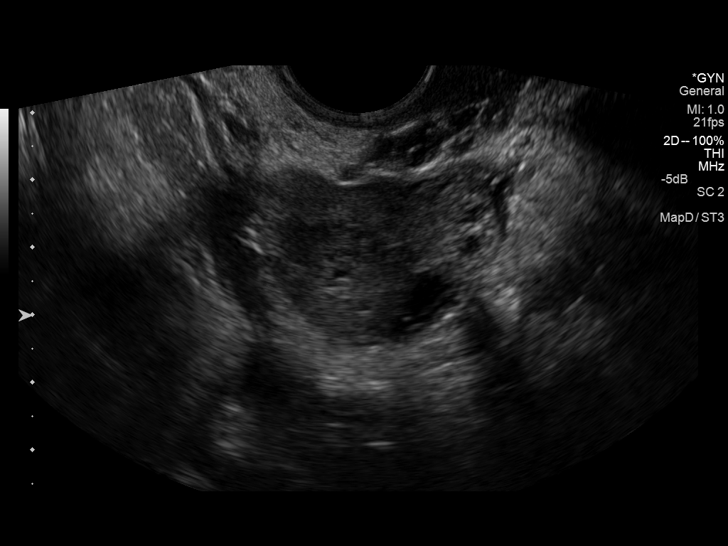
[im 67/67]
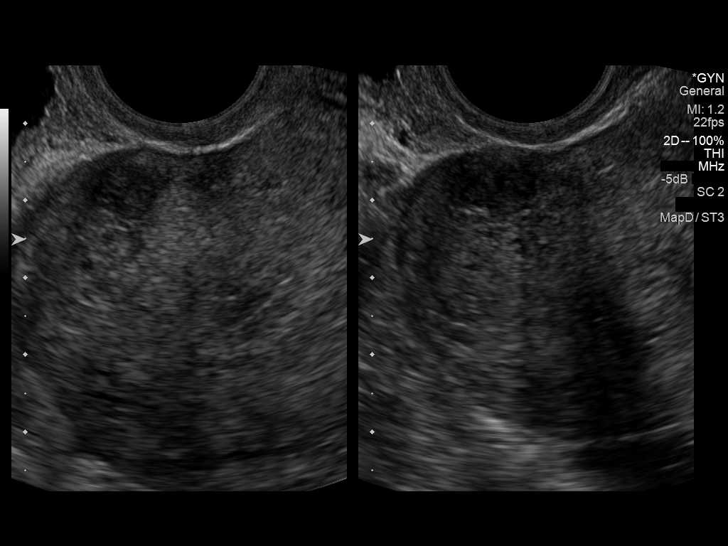

[14 of 25 positions shown; findings below may reference images not displayed]

FINDINGS: Uterus

Measurements: 9.1 x 5.9 x 6.0 cm. Normal myometrial echotexture..
Anterior fundal fibroid measuring 11 mm x 8 mm x 13 mm. Fibroid is
myometrial in location.

Endometrium

Thickness: 13 mm, normal..  No focal abnormality visualized.

Right ovary

Measurements: 38 mm x 26 mm x 37 mm. Normal appearance/no adnexal
mass.

Left ovary

Measurements: 25 mm x 15 mm x 30 mm. Normal appearance/no adnexal
mass.

Other findings

Trace free-fluid
IMPRESSION: Normal pelvic ultrasound aside from approximately 1 cm anterior
fundal fibroid. Negative for tubo-ovarian abscess.

## 2014-03-14 MED ORDER — LIDOCAINE HCL (PF) 1 % IJ SOLN
5.0000 mL | Freq: Once | INTRAMUSCULAR | Status: AC
  Administered 2014-03-14: 5 mL
  Filled 2014-03-14: qty 5

## 2014-03-14 MED ORDER — CEFTRIAXONE SODIUM 250 MG IJ SOLR
250.0000 mg | Freq: Once | INTRAMUSCULAR | Status: AC
  Administered 2014-03-14: 250 mg via INTRAMUSCULAR
  Filled 2014-03-14: qty 250

## 2014-03-14 MED ORDER — METRONIDAZOLE 500 MG PO TABS: 500.0000 mg | ORAL_TABLET | Freq: Two times a day (BID) | ORAL | Status: DC

## 2014-03-14 MED ORDER — DEXTROSE 5 % IV SOLN: 250.0000 mg | Freq: Once | INTRAVENOUS | Status: DC

## 2014-03-14 MED ORDER — TRAMADOL HCL 50 MG PO TABS: 50.0000 mg | ORAL_TABLET | Freq: Four times a day (QID) | ORAL | Status: DC | PRN

## 2014-03-14 MED ORDER — DOXYCYCLINE HYCLATE 100 MG PO CAPS: 100.0000 mg | ORAL_CAPSULE | Freq: Two times a day (BID) | ORAL | Status: DC

## 2014-03-14 MED ORDER — ACETAMINOPHEN 325 MG PO TABS
650.0000 mg | ORAL_TABLET | Freq: Four times a day (QID) | ORAL | Status: DC | PRN
  Administered 2014-03-14 (×2): 650 mg via ORAL
  Filled 2014-03-14 (×2): qty 2

## 2014-03-14 MED ORDER — LACTATED RINGERS IV BOLUS (SEPSIS)
2000.0000 mL | Freq: Once | INTRAVENOUS | Status: AC
  Administered 2014-03-14: 2000 mL via INTRAVENOUS

## 2014-03-14 MED ORDER — ONDANSETRON 8 MG PO TBDP: 8.0000 mg | ORAL_TABLET | Freq: Three times a day (TID) | ORAL | Status: DC | PRN

## 2014-03-14 MED ORDER — DEXTROSE 5 % IV SOLN
100.0000 mg | Freq: Once | INTRAVENOUS | Status: AC
  Administered 2014-03-14: 100 mg via INTRAVENOUS
  Filled 2014-03-14: qty 100

## 2014-03-14 NOTE — ED Notes (Signed)
PT states she had fever Monday, didn't feel good and slept all day. PT went to work yesterday and felt achy from head to toe as well as a headache. PT felt weak and felt like she just couldn't function. Diarrhea x1 today and diarrhea x1 yesterday. Denies dark tarry stool.

## 2014-03-14 NOTE — ED Notes (Signed)
NOTIFIED DR. NANAVATI IN PERSON FOR PATIENTS LAB RESULTS OF CG4+LACTIC ACID ,@16 :10 PM ,2015.

## 2014-03-14 NOTE — Discharge Instructions (Signed)
Please return to the ER if the symptoms get worse. Take the meds provided. Tylenol for fever recommended. See your pcp in 3-5 days.   Pelvic Inflammatory Disease Pelvic inflammatory disease (PID) refers to an infection in some or all of the female organs. The infection can be in the uterus, ovaries, fallopian tubes, or the surrounding tissues in the pelvis. PID can cause abdominal or pelvic pain that comes on suddenly (acute pelvic pain). PID is a serious infection because it can lead to lasting (chronic) pelvic pain or the inability to have children (infertile).  CAUSES  The infection is often caused by the normal bacteria found in the vaginal tissues. PID may also be caused by an infection that is spread during sexual contact. PID can also occur following:   The birth of a baby.   A miscarriage.   An abortion.   Major pelvic surgery.   The use of an intrauterine device (IUD).   A sexual assault.  RISK FACTORS Certain factors can put a person at higher risk for PID, such as:  Being younger than 25 years.  Being sexually active at [place] age.  Usingnonbarrier contraception.  Havingmultiple sexual partners.  Having sex with someone who has symptoms of a genital infection.  Using oral contraception. Other times, certain behaviors can increase the possibility of getting PID, such as:  Having sex during your period.  Using a vaginal douche.  Having an intrauterine device (IUD) in place. SYMPTOMS   Abdominal or pelvic pain.   Fever.   Chills.   Abnormal vaginal discharge.  Abnormal uterine bleeding.   Unusual pain shortly after finishing your period. DIAGNOSIS  Your caregiver will choose some of the following methods to make a diagnosis, such as:   Performinga physical exam and history. A pelvic exam typically reveals a very tender uterus and surrounding pelvis.   Ordering laboratory tests including a pregnancy test, blood tests, and urine  test.  Orderingcultures of the vagina and cervix to check for a sexually transmitted infection (STI).  Performing an ultrasound.   Performing a laparoscopic procedure to look inside the pelvis.  TREATMENT   Antibiotic medicines may be prescribed and taken by mouth.   Sexual partners may be treated when the infection is caused by a sexually transmitted disease (STD).   Hospitalization may be needed to give antibiotics intravenously.  Surgery may be needed, but this is rare. It may take weeks until you are completely well. If you are diagnosed with PID, you should also be checked for human immunodeficiency virus (HIV). HOME CARE INSTRUCTIONS   If given, take your antibiotics as directed. Finish the medicine even if you start to feel better.   Only take over-the-counter or prescription medicines for pain, discomfort, or fever as directed by your caregiver.   Do not have sexual intercourse until treatment is completed or as directed by your caregiver. If PID is confirmed, your recent sexual partner(s) will need treatment.   Keep your follow-up appointments. SEEK MEDICAL CARE IF:   You have increased or abnormal vaginal discharge.   You need prescription medicine for your pain.   You vomit.   You cannot take your medicines.   Your partner has an STD.  SEEK IMMEDIATE MEDICAL CARE IF:   You have a fever.   You have increased abdominal or pelvic pain.   You have chills.   You have pain when you urinate.   You are not better after 72 hours following treatment.  MAKE SURE  YOU:   Understand these instructions.  Will watch your condition.  Will get help right away if you are not doing well or get worse. Document Released: 08/10/2005 Document Revised: 12/05/2012 Document Reviewed: 08/06/2011 [facility] Patient Information 2015 [place], [place]. This information is not intended to replace advice given to you by your health care provider. Make sure you  discuss any questions you have with your health care provider.

## 2014-03-14 NOTE — ED Notes (Signed)
PT reports sharp pain in R leg and asked for pain medication

## 2014-03-14 NOTE — ED Provider Notes (Signed)
CSN: [id number]     Arrival date & time 03/14/14  1445 History   First MD Initiated Contact with Patient 03/14/14 1522     Chief Complaint  Patient presents with  . Weakness  . Fever     (Consider location/radiation/quality/duration/timing/severity/associated sxs/prior Treatment) HPI Comments: Pt comes in with cc of weakness. Pt has hx of renal transplant (2013) and is on immunosuppressive agents. States that she started feeling sluggish on Sunday, and overtime started having weakness, lack of appetite and body aches. Pt denies nausea, emesis, chest pains, shortness of breath, headaches, AMS. + abdominal pain, right lower quadrant, and fevers, and chills (y'day, not today). PT also admits to some burning sensation with urination and vaginal discharge-  Yellow.   Patient is a 38 y.o. female presenting with weakness and fever. The history is provided by the patient.  Weakness Pertinent negatives include no chest pain, no abdominal pain and no shortness of breath.  Fever Associated symptoms: chills and dysuria   Associated symptoms: no chest pain, no confusion, no cough, no diarrhea, no nausea, no rash, no sore throat and no vomiting     Past Medical History  Diagnosis Date  . Dialysis patient   . Hypertension   . History of renal transplant 10/09/2011  . GERD (gastroesophageal reflux disease)    Past Surgical History  Procedure Laterality Date  . Diaylsis shunt    . Kidney transplant  2011/10/09    deceased donor kidney   Family History  Problem Relation Age of Onset  . Diabetes Father   . Diabetes Paternal Aunt   . Alcohol abuse Paternal Uncle   . Cancer Maternal Grandmother     breast  . Diabetes Paternal Grandmother   . Alcohol abuse Paternal Grandmother   . Alcohol abuse Paternal Grandfather    History  Substance Use Topics  . Smoking status: Never Smoker   . Smokeless tobacco: Not on file  . Alcohol Use: No   OB History   Grav Para Term Preterm Abortions TAB SAB Ect  Mult Living   3         3     Review of Systems  Constitutional: Positive for fever, chills, activity change and appetite change.  HENT: Negative for facial swelling, sore throat and trouble swallowing.   Respiratory: Negative for cough, shortness of breath and wheezing.   Cardiovascular: Negative for chest pain.  Gastrointestinal: Negative for nausea, vomiting, abdominal pain, diarrhea, constipation, blood in stool and abdominal distention.  Genitourinary: Positive for dysuria, vaginal discharge and vaginal pain. Negative for hematuria and difficulty urinating.  Musculoskeletal: Negative for neck pain.  Skin: Negative for color change and rash.  Neurological: Positive for weakness. Negative for speech difficulty.  Hematological: Does not bruise/bleed easily.  Psychiatric/Behavioral: Negative for confusion.      Allergies  Review of patient's allergies indicates no known allergies.  Home Medications   Prior to Admission medications   Medication Sig Start Date End Date Taking? Authorizing Provider  labetalol (NORMODYNE) 300 MG tablet Take 300 mg by mouth daily.   Yes Historical Provider, MD  metroNIDAZOLE (METROGEL) 0.75 % gel Apply 1 application topically 2 (two) times a week.   Yes Historical Provider, MD  mycophenolate (MYFORTIC) 180 MG EC tablet Take 360 mg by mouth 2 (two) times daily.    Yes Historical Provider, MD  omeprazole (PRILOSEC) 20 MG capsule Take 20 mg by mouth daily.  04/05/12  Yes Historical Provider, MD  predniSONE (DELTASONE) 5 MG tablet  Take 10 mg by mouth daily.    Yes Historical Provider, MD  Pseudoeph-Doxylamine-DM-APAP (NYQUIL PO) Take 2 capsules by mouth as needed (headaches).   Yes Historical Provider, MD  sulfamethoxazole-trimethoprim (BACTRIM,SEPTRA) 400-80 MG per tablet Take 1 tablet by mouth See admin instructions. Monday Wednesday Friday 05/31/12  Yes Historical Provider, MD  tacrolimus (PROGRAF) 0.5 MG capsule Take 0.5 mg by mouth 2 (two) times daily.  Total of 1.5mg  BID   Yes Historical Provider, MD  tacrolimus (PROGRAF) 1 MG capsule Take 1 mg by mouth 2 (two) times daily. Total of 1.5mg  BID   Yes Historical Provider, MD  doxycycline (VIBRAMYCIN) 100 MG capsule Take 1 capsule (100 mg total) by mouth 2 (two) times daily. 03/14/14   Varney Biles, MD  metroNIDAZOLE (FLAGYL) 500 MG tablet Take 1 tablet (500 mg total) by mouth 2 (two) times daily. 03/14/14   Varney Biles, MD  ondansetron (ZOFRAN ODT) 8 MG disintegrating tablet Take 1 tablet (8 mg total) by mouth every 8 (eight) hours as needed for nausea. 03/14/14   Varney Biles, MD  traMADol (ULTRAM) 50 MG tablet Take 1 tablet (50 mg total) by mouth every 6 (six) hours as needed. 03/14/14   Taziah Difatta Kathrynn Humble, MD   BP 123/72  Pulse 91  Temp(Src) 99.5 F (37.5 C) (Oral)  Resp 17  SpO2 99%  LMP 02/24/2014 Physical Exam  Nursing note and vitals reviewed. Constitutional: She is oriented to person, place, and time. She appears well-developed and well-nourished.  HENT:  Head: Normocephalic and atraumatic.  Eyes: Conjunctivae and EOM are normal. Pupils are equal, round, and reactive to light.  Neck: Normal range of motion. Neck supple.  Cardiovascular: Normal rate, regular rhythm, normal heart sounds and intact distal pulses.   No murmur heard. Pulmonary/Chest: Effort normal. No respiratory distress. She has no wheezes.  Abdominal: Soft. Bowel sounds are normal. She exhibits no distension. There is tenderness. There is no rebound and no guarding.  RLQ and suprapubic tenderness  Genitourinary: Vagina normal and uterus normal.  External exam - normal, no lesions Speculum exam: Pt has YELLOW, PURULENT discharge, no blood Bimanual exam: Patient has moderate CMT, cervical os is closed  Neurological: She is alert and oriented to person, place, and time.  Skin: Skin is warm and dry.    ED Course  Procedures (including critical care time) Labs Review Labs Reviewed  WET PREP, GENITAL - Abnormal;  Notable for the following:    WBC, Wet Prep HPF POC TOO NUMEROUS TO COUNT (*)    All other components within normal limits  CBC WITH DIFFERENTIAL - Abnormal; Notable for the following:    WBC 10.6 (*)    Neutrophils Relative % 82 (*)    Neutro Abs 8.6 (*)    Lymphocytes Relative 3 (*)    Lymphs Abs 0.4 (*)    Monocytes Relative 15 (*)    Monocytes Absolute 1.6 (*)    All other components within normal limits  COMPREHENSIVE METABOLIC PANEL - Abnormal; Notable for the following:    Sodium 130 (*)    CO2 18 (*)    Glucose, Bld 127 (*)    BUN 29 (*)    Creatinine, Ser 2.06 (*)    ALT 44 (*)    GFR calc non Af Amer 30 (*)    GFR calc Af Amer 34 (*)    All other components within normal limits  URINALYSIS, ROUTINE W REFLEX MICROSCOPIC - Abnormal; Notable for the following:    APPearance CLOUDY (*)  Hgb urine dipstick MODERATE (*)    Protein, ur 30 (*)    Leukocytes, UA LARGE (*)    All other components within normal limits  URINE MICROSCOPIC-ADD ON - Abnormal; Notable for the following:    Squamous Epithelial / LPF FEW (*)    All other components within normal limits  CULTURE, BLOOD (ROUTINE X 2)  CULTURE, BLOOD (ROUTINE X 2)  URINE CULTURE  GC/CHLAMYDIA PROBE AMP  HIV ANTIBODY (ROUTINE TESTING)  I-STAT CG4 LACTIC ACID, ED  POC URINE PREG, ED    Imaging Review US Transvaginal Non-ob  03/14/2014   CLINICAL DATA:  Tubo-ovarian abscess.  RIGHT-sided pelvic pain.  EXAM: TRANSABDOMINAL AND TRANSVAGINAL ULTRASOUND OF PELVIS  TECHNIQUE: Both transabdominal and transvaginal ultrasound examinations of the pelvis were performed. Transabdominal technique was performed for global imaging of the pelvis including uterus, ovaries, adnexal regions, and pelvic cul-de-sac. It was necessary to proceed with endovaginal exam following the transabdominal exam to visualize the endometrium.  COMPARISON:  None  FINDINGS: Uterus  Measurements: 9.1 x 5.9 x 6.0 cm. Normal myometrial echotexture. Anterior  fundal fibroid measuring 11 mm x 8 mm x 13 mm. Fibroid is myometrial in location.  Endometrium  Thickness: 13 mm, normal.  No focal abnormality visualized.  Right ovary  Measurements: 38 mm x 26 mm x 37 mm. Normal appearance/no adnexal mass.  Left ovary  Measurements: 25 mm x 15 mm x 30 mm. Normal appearance/no adnexal mass.  Other findings  Trace free-fluid  IMPRESSION: Normal pelvic ultrasound aside from approximately 1 cm anterior fundal fibroid. Negative for tubo-ovarian abscess.   Electronically Signed   By: Dereck Ligas M.D.   On: 03/14/2014 18:25   US Pelvis Complete  03/14/2014   CLINICAL DATA:  Tubo-ovarian abscess.  RIGHT-sided pelvic pain.  EXAM: TRANSABDOMINAL AND TRANSVAGINAL ULTRASOUND OF PELVIS  TECHNIQUE: Both transabdominal and transvaginal ultrasound examinations of the pelvis were performed. Transabdominal technique was performed for global imaging of the pelvis including uterus, ovaries, adnexal regions, and pelvic cul-de-sac. It was necessary to proceed with endovaginal exam following the transabdominal exam to visualize the endometrium.  COMPARISON:  None  FINDINGS: Uterus  Measurements: 9.1 x 5.9 x 6.0 cm. Normal myometrial echotexture. Anterior fundal fibroid measuring 11 mm x 8 mm x 13 mm. Fibroid is myometrial in location.  Endometrium  Thickness: 13 mm, normal.  No focal abnormality visualized.  Right ovary  Measurements: 38 mm x 26 mm x 37 mm. Normal appearance/no adnexal mass.  Left ovary  Measurements: 25 mm x 15 mm x 30 mm. Normal appearance/no adnexal mass.  Other findings  Trace free-fluid  IMPRESSION: Normal pelvic ultrasound aside from approximately 1 cm anterior fundal fibroid. Negative for tubo-ovarian abscess.   Electronically Signed   By: Dereck Ligas M.D.   On: 03/14/2014 18:25     EKG Interpretation None      MDM   Final diagnoses:  PID (acute pelvic inflammatory disease)    Immunocompromised patient comes in with cc of fever and weakness and body  aches. Exam showed RLQ tenderness, with pelvic exam showing purulent drainage and mild cervical motion tenderness. We ordered US, and there is no TOA. Pt's lactate is neg, Cr is slightly elevated - pt has been drinking lots of fluid and has no nausea, which is reassuring. Repeat abd exam performed x 2 times- and is unchanged, so no CT was deemed necessary after the pelvic exam, and we decided to stick with that initial decision.  Pt given ceftriaxone  and doxy iv in the ER. She is to go home as PID with doxy and flagyl.  Pt's immunocompromised status is explained to her again, and she is to return to the ER if her symptoms are getting worse or she is unable to tolerate po. Close PCP f/u advised EVEN if she is doing better.  Pt is stable for discharge.    Varney Biles, MD 03/14/14 2300

## 2014-03-14 NOTE — ED Notes (Addendum)
MD at bedside. Ankit MD w/ Nissa Stannard RN for pelvic exam

## 2014-03-14 NOTE — ED Notes (Signed)
Per pt sts she hasn't been feeling well over the past few days. sts no appetite, fever, body aches. sts she has been drinking a lot of water.

## 2014-03-15 LAB — GC/CHLAMYDIA PROBE AMP
CT Probe RNA: POSITIVE — AB
GC Probe RNA: POSITIVE — AB

## 2014-03-17 ENCOUNTER — Telehealth (HOSPITAL_BASED_OUTPATIENT_CLINIC_OR_DEPARTMENT_OTHER): Payer: Self-pay | Admitting: Emergency Medicine

## 2014-03-17 ENCOUNTER — Telehealth (HOSPITAL_BASED_OUTPATIENT_CLINIC_OR_DEPARTMENT_OTHER): Payer: Self-pay

## 2014-03-17 LAB — URINE CULTURE: Colony Count: 30000

## 2014-03-17 NOTE — Telephone Encounter (Signed)
Results received from [facility].  (+) Gonorrhea & Chlamydia.  Treated with Rocephin and given Rx for Doxycycline.  [facility] form completed and faxed.  Call and notify patient.

## 2014-03-17 NOTE — Telephone Encounter (Signed)
Pt returned call, ID verified x three. Notified of + chlamydia and gonorrhea and that treatment was given in ED with Rocephin and Doxycycline. Pt verified that she is taking doxycycline as prescribed. STD instructions provided- pt verbalized understandin

## 2014-03-18 NOTE — Progress Notes (Signed)
ED Antimicrobial Stewardship Positive Culture Follow Up   Candace Griffin is an 38 y.o. female who presented to [facility] on 03/14/2014 with a chief complaint of  Chief Complaint  Patient presents with  . Weakness  . Fever    Recent Results (from the past 720 hour(s))  CULTURE, BLOOD (ROUTINE X 2)     Status: None   Collection Time    03/14/14  3:45 PM      Result Value Ref Range Status   Specimen Description BLOOD LEFT ANTECUBITAL   Final   Special Requests BOTTLES DRAWN AEROBIC AND ANAEROBIC [facility]   Final   Culture  Setup Time     Final   Value: 03/14/2014 19:28     Performed at [facility]   Culture     Final   Value:        BLOOD CULTURE RECEIVED NO GROWTH TO DATE CULTURE WILL BE HELD FOR 5 DAYS BEFORE ISSUING A FINAL NEGATIVE REPORT     Performed at [facility]   Report Status PENDING   Incomplete  CULTURE, BLOOD (ROUTINE X 2)     Status: None   Collection Time    03/14/14  3:50 PM      Result Value Ref Range Status   Specimen Description BLOOD L HAND   Final   Special Requests BOTTLES DRAWN AEROBIC AND ANAEROBIC 6CC   Final   Culture  Setup Time     Final   Value: 03/14/2014 19:28     Performed at [facility]   Culture     Final   Value:        BLOOD CULTURE RECEIVED NO GROWTH TO DATE CULTURE WILL BE HELD FOR 5 DAYS BEFORE ISSUING A FINAL NEGATIVE REPORT     Performed at [facility]   Report Status PENDING   Incomplete  URINE CULTURE     Status: None   Collection Time    03/14/14  4:44 PM      Result Value Ref Range Status   Specimen Description URINE, CLEAN CATCH   Final   Special Requests NONE   Final   Culture  Setup Time     Final   Value: 03/14/2014 17:35     Performed at [place]     Final   Value: 30,000 COLONIES/ML     Performed at [facility]   Culture     Final   Value: ESCHERICHIA COLI     Performed at [facility]   Report Status 03/17/2014 FINAL   Final   Organism  ID, Bacteria ESCHERICHIA COLI   Final  GC/CHLAMYDIA PROBE AMP     Status: Abnormal   Collection Time    03/14/14  5:24 PM      Result Value Ref Range Status   CT Probe RNA POSITIVE (*) NEGATIVE Final   Comment: (NOTE)     A Positive CT or NG Nucleic Acid Amplification Test (NAAT) result     should be considered presumptive evidence of infection.  The result     should be evaluated along with physical examination and other     diagnostic findings.   GC Probe RNA POSITIVE (*) NEGATIVE Final   Comment: (NOTE)     A Positive CT or NG Nucleic Acid Amplification Test (NAAT) result     should be considered presumptive evidence of infection.  The result     should  be evaluated along with physical examination and other     diagnostic findings.                                                                                               **Normal Reference Range: Negative**          Assay performed using the Gen-Probe APTIMA COMBO2 (R) Assay.     Acceptable specimen types for this assay include APTIMA Swabs (Unisex,     endocervical, urethral, or vaginal), first void urine, and ThinPrep     liquid based cytology samples.     Performed at [place], GENITAL     Status: Abnormal   Collection Time    03/14/14  5:24 PM      Result Value Ref Range Status   Yeast Wet Prep HPF POC NONE SEEN  NONE SEEN Final   Trich, Wet Prep NONE SEEN  NONE SEEN Final   Clue Cells Wet Prep HPF POC NONE SEEN  NONE SEEN Final   WBC, Wet Prep HPF POC TOO NUMEROUS TO COUNT (*) NONE SEEN Final    [x]  Patient discharged originally without antimicrobial agent for UTI and treatment is now indicated  New antibiotic prescription: Keflex 500mg  PO BID x 7 days  ED Provider: Cleatrice Burke, PA-C   Norva Riffle 03/18/2014, 11:25 AM Infectious Diseases Pharmacist Phone# [phone]

## 2014-03-18 NOTE — Telephone Encounter (Signed)
Post ED Visit - Positive Culture Follow-up: Successful Patient Follow-Up  Culture assessed and recommendations reviewed by: []  Wes [place], Pharm.D., BCPS []  Heide Guile, Pharm.D., BCPS []  Alycia Rossetti, Pharm.D., BCPS []  [place], Pharm.D., BCPS, AAHIVP [x]  Legrand [place], Pharm.D., BCPS, AAHIVP  Positive urine culture  [x]  Patient discharged without antimicrobial prescription and treatment is now indicated []  Organism is resistant to prescribed ED discharge antimicrobial []  Patient with positive blood cultures  Changes discussed with ED provider: Cleatrice Burke PA-C New antibiotic prescription: Keflex 500 mg PO BID x 7 days    Lerline Valdivia 03/18/2014, 2:37 PM

## 2014-03-19 ENCOUNTER — Telehealth (HOSPITAL_BASED_OUTPATIENT_CLINIC_OR_DEPARTMENT_OTHER): Payer: Self-pay

## 2014-03-19 NOTE — Telephone Encounter (Signed)
Pt informed of Dx and need for addl tx.  Rx called to [facility] [phone] and Rx given to RPh.

## 2014-03-20 LAB — CULTURE, BLOOD (ROUTINE X 2)
Culture: NO GROWTH
Culture: NO GROWTH

## 2014-04-04 ENCOUNTER — Other Ambulatory Visit (HOSPITAL_COMMUNITY)
Admission: RE | Admit: 2014-04-04 | Discharge: 2014-04-04 | Disposition: A | Discharge status: Home / Self Care | Payer: Medicare Other | Source: Ambulatory Visit | Attending: Obstetrics and Gynecology | Admitting: Obstetrics and Gynecology

## 2014-04-04 ENCOUNTER — Encounter: Payer: Self-pay | Admitting: Obstetrics and Gynecology

## 2014-04-04 ENCOUNTER — Ambulatory Visit (INDEPENDENT_AMBULATORY_CARE_PROVIDER_SITE_OTHER): Payer: Medicare Other | Admitting: Obstetrics and Gynecology

## 2014-04-04 VITALS — BP 134/80 | HR 92 | Temp 98.3°F | Ht 66.0 in | Wt 145.2 lb

## 2014-04-04 DIAGNOSIS — R87610 Atypical squamous cells of undetermined significance on cytologic smear of cervix (ASC-US): Secondary | ICD-10-CM | POA: Diagnosis not present

## 2014-04-04 DIAGNOSIS — R87810 Cervical high risk human papillomavirus (HPV) DNA test positive: Secondary | ICD-10-CM | POA: Diagnosis not present

## 2014-04-04 DIAGNOSIS — Z124 Encounter for screening for malignant neoplasm of cervix: Secondary | ICD-10-CM | POA: Diagnosis not present

## 2014-04-04 NOTE — Progress Notes (Signed)
Patient ID: Candace Griffin, female   DOB: 11/12/1975, 38 y.o.   MRN: [id number] Patient referred here today for colposcopy. Upon review of records, patient had an abnormal pap smear in October 2014 (greater than 6 months ago) consisting of ASCUS +HPV. Results reviewed with patient. Plan to repeat pap smear today  GENERAL: Well-developed, well-nourished female in no acute distress.  ABDOMEN: Soft, nontender, nondistended. No organomegaly. PELVIC: Normal external female genitalia. Vagina is pink and rugated.  Normal discharge. Normal appearing cervix.  EXTREMITIES: No cyanosis, clubbing, or edema, 2+ distal pulses.  A/P 38 yo with abnormal pap smear here for repeat pap smear Pap smear collected Patient will be contacted with any abnormal results.

## 2014-04-05 LAB — CYTOLOGY - PAP

## 2014-04-18 ENCOUNTER — Telehealth: Payer: Self-pay

## 2014-04-18 NOTE — Telephone Encounter (Signed)
Attempted to contact patient on both available numbers-- no answer on either-- left messages stating we are calling with results, please call clinic. Message sent to admin pool to schedule patient for colpo and call patient with appointment.

## 2014-04-18 NOTE — Telephone Encounter (Addendum)
Pt left message that she is returning Taylor's call for results.  She will be working all week so a detailed message can be left on her voice mail.  [phone]   Called pt and left detailed message that her Pap was abnormal and she requires a repeat Colposcopy. It will be the same procedure that she had last December. Her appt is 05/03/14 @ 1515 with Dr. Elly Modena.  She may call back if she has additional questions.

## 2014-04-18 NOTE — Telephone Encounter (Signed)
Message copied by Geanie Logan on Wed Apr 18, 2014 11:50 AM ------      Message from: CONSTANT, [place]      Created: Wed Apr 18, 2014  9:03 AM       Please schedule patient for colposcopy.            Thanks            Peggy            ----- Message -----         From: Geanie Logan, RN         Sent: 04/17/2014   4:38 PM           To: Mora Bellman, MD            Hi Dr. Elly Modena,             In reviewing results, noticed this patient's pap was LSIL/HPV+. Schedule colpo? Please advise and send message to clinical in basket in case I am not here.             Thank you,       Lisette Grinder, RN        ------

## 2014-05-03 ENCOUNTER — Ambulatory Visit (INDEPENDENT_AMBULATORY_CARE_PROVIDER_SITE_OTHER): Payer: Medicare Other | Admitting: Obstetrics and Gynecology

## 2014-05-03 ENCOUNTER — Other Ambulatory Visit (HOSPITAL_COMMUNITY)
Admission: RE | Admit: 2014-05-03 | Discharge: 2014-05-03 | Disposition: A | Discharge status: Home / Self Care | Payer: Medicare Other | Source: Ambulatory Visit | Attending: Obstetrics and Gynecology | Admitting: Obstetrics and Gynecology

## 2014-05-03 ENCOUNTER — Encounter: Payer: Self-pay | Admitting: Obstetrics and Gynecology

## 2014-05-03 VITALS — BP 110/81 | HR 82 | Ht 67.0 in | Wt 147.8 lb

## 2014-05-03 DIAGNOSIS — N870 Mild cervical dysplasia: Secondary | ICD-10-CM | POA: Diagnosis not present

## 2014-05-03 DIAGNOSIS — N72 Inflammatory disease of cervix uteri: Secondary | ICD-10-CM | POA: Diagnosis not present

## 2014-05-03 DIAGNOSIS — R87612 Low grade squamous intraepithelial lesion on cytologic smear of cervix (LGSIL): Secondary | ICD-10-CM | POA: Diagnosis not present

## 2014-05-03 NOTE — Progress Notes (Signed)
Patient ID: Candace Griffin, female   DOB: 01-01-1976, 38 y.o.   MRN: [id number] 38 yo with LGSIL on 03/2014 pap smear  Patient given informed consent, signed copy in the chart, time out was performed.  Placed in lithotomy position. Cervix viewed with speculum and colposcope after application of acetic acid.   Colposcopy adequate?  No TZ not visualized  Acetowhite lesions?yes at 3 o'clock Punctation?no Mosaicism?  no Abnormal vasculature?  no Biopsies?yes at [place]?yes  COMMENTS: Patient was given post procedure instructions.  She will return in 2 weeks for results.

## 2014-05-07 LAB — POCT PREGNANCY, URINE: Preg Test, Ur: NEGATIVE

## 2014-05-08 ENCOUNTER — Telehealth: Payer: Self-pay | Admitting: General Practice

## 2014-05-08 NOTE — Telephone Encounter (Signed)
Message copied by Shelly Coss on Tue May 08, 2014 10:51 AM ------      Message from: CONSTANT, Vickii Chafe      Created: Tue May 08, 2014  9:18 AM       Please inform patient of colpo results consistent with pap smear. Patient needs to return in 6 months for repeat pap smear            Thanks            Peggy ------

## 2014-05-08 NOTE — Telephone Encounter (Signed)
Patient called back and I informed her of results and recommendations. Patient verbalized understanding and had no other questions

## 2014-05-08 NOTE — Telephone Encounter (Signed)
Called patient, no answer- left message that we are trying to reach you with some results, nothing urgent but please call us back at the clinics

## 2014-06-11 DIAGNOSIS — N185 Chronic kidney disease, stage 5: Secondary | ICD-10-CM | POA: Diagnosis not present

## 2014-06-11 DIAGNOSIS — Z940 Kidney transplant status: Secondary | ICD-10-CM | POA: Diagnosis not present

## 2014-06-11 DIAGNOSIS — E785 Hyperlipidemia, unspecified: Secondary | ICD-10-CM | POA: Diagnosis not present

## 2014-06-11 DIAGNOSIS — D631 Anemia in chronic kidney disease: Secondary | ICD-10-CM | POA: Diagnosis not present

## 2014-06-25 ENCOUNTER — Encounter: Payer: Self-pay | Admitting: Obstetrics and Gynecology

## 2014-08-10 ENCOUNTER — Encounter: Payer: Self-pay | Admitting: *Deleted

## 2014-08-24 HISTORY — PX: COLPOSCOPY: SHX161

## 2014-09-17 ENCOUNTER — Inpatient Hospital Stay (HOSPITAL_COMMUNITY)
Admission: EM | Admit: 2014-09-17 | Discharge: 2014-09-20 | DRG: 683 | Disposition: A | Discharge status: Other Acute Inpatient Hospital | Payer: Medicare Other | Attending: Family Medicine | Admitting: Family Medicine

## 2014-09-17 ENCOUNTER — Encounter (HOSPITAL_COMMUNITY): Payer: Self-pay | Admitting: *Deleted

## 2014-09-17 DIAGNOSIS — D631 Anemia in chronic kidney disease: Secondary | ICD-10-CM | POA: Diagnosis present

## 2014-09-17 DIAGNOSIS — K219 Gastro-esophageal reflux disease without esophagitis: Secondary | ICD-10-CM | POA: Diagnosis present

## 2014-09-17 DIAGNOSIS — E872 Acidosis: Secondary | ICD-10-CM | POA: Diagnosis present

## 2014-09-17 DIAGNOSIS — Z992 Dependence on renal dialysis: Secondary | ICD-10-CM | POA: Diagnosis not present

## 2014-09-17 DIAGNOSIS — N179 Acute kidney failure, unspecified: Principal | ICD-10-CM | POA: Diagnosis present

## 2014-09-17 DIAGNOSIS — N186 End stage renal disease: Secondary | ICD-10-CM | POA: Diagnosis present

## 2014-09-17 DIAGNOSIS — R1110 Vomiting, unspecified: Secondary | ICD-10-CM | POA: Diagnosis not present

## 2014-09-17 DIAGNOSIS — Z940 Kidney transplant status: Secondary | ICD-10-CM

## 2014-09-17 DIAGNOSIS — E860 Dehydration: Secondary | ICD-10-CM | POA: Diagnosis present

## 2014-09-17 DIAGNOSIS — Z048 Encounter for examination and observation for other specified reasons: Secondary | ICD-10-CM | POA: Diagnosis not present

## 2014-09-17 DIAGNOSIS — I120 Hypertensive chronic kidney disease with stage 5 chronic kidney disease or end stage renal disease: Secondary | ICD-10-CM | POA: Diagnosis present

## 2014-09-17 DIAGNOSIS — N038 Chronic nephritic syndrome with other morphologic changes: Secondary | ICD-10-CM | POA: Diagnosis present

## 2014-09-17 DIAGNOSIS — D649 Anemia, unspecified: Secondary | ICD-10-CM | POA: Insufficient documentation

## 2014-09-17 DIAGNOSIS — R310 Gross hematuria: Secondary | ICD-10-CM | POA: Diagnosis not present

## 2014-09-17 DIAGNOSIS — I10 Essential (primary) hypertension: Secondary | ICD-10-CM | POA: Diagnosis not present

## 2014-09-17 LAB — COMPREHENSIVE METABOLIC PANEL
ALT: 13 U/L (ref 0–35)
AST: 17 U/L (ref 0–37)
Albumin: 3.4 g/dL — ABNORMAL LOW (ref 3.5–5.2)
Alkaline Phosphatase: 81 U/L (ref 39–117)
Anion gap: 14 (ref 5–15)
BUN: 105 mg/dL — ABNORMAL HIGH (ref 6–23)
CO2: 14 mmol/L — ABNORMAL LOW (ref 19–32)
Calcium: 8.9 mg/dL (ref 8.4–10.5)
Chloride: 105 mmol/L (ref 96–112)
Creatinine, Ser: 17.64 mg/dL — ABNORMAL HIGH (ref 0.50–1.10)
GFR calc Af Amer: 3 mL/min — ABNORMAL LOW (ref >90)
GFR calc non Af Amer: 2 mL/min — ABNORMAL LOW (ref >90)
Glucose, Bld: 88 mg/dL (ref 70–99)
Potassium: 4.4 mmol/L (ref 3.5–5.1)
Sodium: 133 mmol/L — ABNORMAL LOW (ref 135–145)
Total Bilirubin: 0.6 mg/dL (ref 0.3–1.2)
Total Protein: 7.9 g/dL (ref 6.0–8.3)

## 2014-09-17 LAB — CBC WITH DIFFERENTIAL/PLATELET
Basophils Absolute: 0 10*3/uL (ref 0.0–0.1)
Basophils Relative: 1 % (ref 0–1)
Eosinophils Absolute: 1.2 10*3/uL — ABNORMAL HIGH (ref 0.0–0.7)
Eosinophils Relative: 21 % — ABNORMAL HIGH (ref 0–5)
HCT: 27.4 % — ABNORMAL LOW (ref 36.0–46.0)
Hemoglobin: 9.2 g/dL — ABNORMAL LOW (ref 12.0–15.0)
Lymphocytes Relative: 8 % — ABNORMAL LOW (ref 12–46)
Lymphs Abs: 0.5 10*3/uL — ABNORMAL LOW (ref 0.7–4.0)
MCH: 28.7 pg (ref 26.0–34.0)
MCHC: 33.6 g/dL (ref 30.0–36.0)
MCV: 85.4 fL (ref 78.0–100.0)
Monocytes Absolute: 0.6 10*3/uL (ref 0.1–1.0)
Monocytes Relative: 11 % (ref 3–12)
Neutro Abs: 3.5 10*3/uL (ref 1.7–7.7)
Neutrophils Relative %: 59 % (ref 43–77)
Platelets: 279 10*3/uL (ref 150–400)
RBC: 3.21 MIL/uL — ABNORMAL LOW (ref 3.87–5.11)
RDW: 12.7 % (ref 11.5–15.5)
WBC: 5.8 10*3/uL (ref 4.0–10.5)

## 2014-09-17 LAB — POC URINE PREG, ED: Preg Test, Ur: NEGATIVE

## 2014-09-17 LAB — URINALYSIS, ROUTINE W REFLEX MICROSCOPIC
Bilirubin Urine: NEGATIVE
Glucose, UA: NEGATIVE mg/dL
Ketones, ur: NEGATIVE mg/dL
Leukocytes, UA: NEGATIVE
Nitrite: NEGATIVE
Protein, ur: 100 mg/dL — AB
Specific Gravity, Urine: 1.01 (ref 1.005–1.030)
Urobilinogen, UA: 0.2 mg/dL (ref 0.0–1.0)
pH: 6 (ref 5.0–8.0)

## 2014-09-17 LAB — URINE MICROSCOPIC-ADD ON

## 2014-09-17 LAB — LIPASE, BLOOD: Lipase: 57 U/L (ref 11–59)

## 2014-09-17 MED ORDER — SODIUM CHLORIDE 0.9 % IV BOLUS (SEPSIS)
500.0000 mL | Freq: Once | INTRAVENOUS | Status: AC
  Administered 2014-09-17: 500 mL via INTRAVENOUS

## 2014-09-17 MED ORDER — SODIUM CHLORIDE 0.9 % IV SOLN: 250.0000 mL | INTRAVENOUS | Status: DC | PRN

## 2014-09-17 MED ORDER — MYCOPHENOLATE SODIUM 180 MG PO TBEC
360.0000 mg | DELAYED_RELEASE_TABLET | Freq: Two times a day (BID) | ORAL | Status: DC
Start: 2014-09-17 — End: 2014-09-20
  Administered 2014-09-17 – 2014-09-20 (×4): 360 mg via ORAL
  Filled 2014-09-17 (×7): qty 2

## 2014-09-17 MED ORDER — SODIUM CHLORIDE 0.9 % IV SOLN
INTRAVENOUS | Status: AC
  Administered 2014-09-17: 23:00:00 via INTRAVENOUS

## 2014-09-17 MED ORDER — HEPARIN SODIUM (PORCINE) 5000 UNIT/ML IJ SOLN
5000.0000 [IU] | Freq: Three times a day (TID) | INTRAMUSCULAR | Status: DC
  Filled 2014-09-17 (×10): qty 1

## 2014-09-17 MED ORDER — TACROLIMUS 1 MG PO CAPS
1.5000 mg | ORAL_CAPSULE | Freq: Two times a day (BID) | ORAL | Status: DC
  Administered 2014-09-17 – 2014-09-20 (×4): 1.5 mg via ORAL
  Filled 2014-09-17 (×7): qty 1

## 2014-09-17 MED ORDER — SODIUM CHLORIDE 0.9 % IJ SOLN
3.0000 mL | Freq: Two times a day (BID) | INTRAMUSCULAR | Status: DC
  Administered 2014-09-18 – 2014-09-19 (×2): 3 mL via INTRAVENOUS

## 2014-09-17 MED ORDER — ONDANSETRON HCL 4 MG PO TABS: 4.0000 mg | ORAL_TABLET | Freq: Four times a day (QID) | ORAL | Status: DC | PRN

## 2014-09-17 MED ORDER — ONDANSETRON HCL 4 MG/2ML IJ SOLN
4.0000 mg | Freq: Once | INTRAMUSCULAR | Status: AC
  Administered 2014-09-17: 4 mg via INTRAVENOUS
  Filled 2014-09-17: qty 2

## 2014-09-17 MED ORDER — PREDNISONE 10 MG PO TABS
10.0000 mg | ORAL_TABLET | Freq: Every day | ORAL | Status: DC
Start: 2014-09-18 — End: 2014-09-20
  Administered 2014-09-18 – 2014-09-20 (×2): 10 mg via ORAL
  Filled 2014-09-17 (×4): qty 1

## 2014-09-17 MED ORDER — DARBEPOETIN ALFA 60 MCG/0.3ML IJ SOSY
60.0000 ug | PREFILLED_SYRINGE | INTRAMUSCULAR | Status: DC
  Filled 2014-09-17: qty 0.3

## 2014-09-17 MED ORDER — SODIUM CHLORIDE 0.9 % IJ SOLN
3.0000 mL | Freq: Two times a day (BID) | INTRAMUSCULAR | Status: DC
  Administered 2014-09-17 – 2014-09-19 (×5): 3 mL via INTRAVENOUS

## 2014-09-17 MED ORDER — SODIUM CHLORIDE 0.9 % IJ SOLN: 3.0000 mL | INTRAMUSCULAR | Status: DC | PRN

## 2014-09-17 MED ORDER — ONDANSETRON HCL 4 MG/2ML IJ SOLN
4.0000 mg | Freq: Four times a day (QID) | INTRAMUSCULAR | Status: DC | PRN
  Administered 2014-09-17 – 2014-09-19 (×7): 4 mg via INTRAVENOUS
  Filled 2014-09-17 (×5): qty 2

## 2014-09-17 NOTE — ED Provider Notes (Signed)
CSN: [id number]     Arrival date & time 09/17/14  1351 History   First MD Initiated Contact with Patient 09/17/14 1741     Chief Complaint  Patient presents with  . Emesis  . Fatigue     (Consider location/radiation/quality/duration/timing/severity/associated sxs/prior Treatment) HPI Candace Griffin is a 39 y.o. female with history of renal transplant in 2013, currently on Prograf comes in for evaluation of nausea and fatigue. Patient states for the past 3 days she has felt exceptionally tired and nauseous and thirsty. She reports she can generally tell when her kidneys are not working right and that's why she came in today. She also reports generalized muscle aches for the past 2 days, but has not taken anything for her discomfort. She has been nauseous for the past 4 or 5 days and has vomited 3 times today, nonbloody nonbilious. There are no aggravating or relieving factors. She denies fevers, abdominal pain, dysuria, hematuria, flank or back pain. Patient does admit she is currently on her menses. No headache, vision changes, chest pain, shortness of breath, cough, diarrhea constipation, numbness or weakness. Reports that she is followed by Dr. Justin Mend, nephrology at [facility].   Past Medical History  Diagnosis Date  . Dialysis patient   . Hypertension   . History of renal transplant October 17, 2011  . GERD (gastroesophageal reflux disease)    Past Surgical History  Procedure Laterality Date  . Diaylsis shunt    . Kidney transplant  10-17-2011    deceased donor kidney   Family History  Problem Relation Age of Onset  . Diabetes Father   . Diabetes Paternal Aunt   . Alcohol abuse Paternal Uncle   . Cancer Maternal Grandmother     breast  . Diabetes Paternal Grandmother   . Alcohol abuse Paternal Grandmother   . Alcohol abuse Paternal Grandfather    History  Substance Use Topics  . Smoking status: Never Smoker   . Smokeless tobacco: Never Used  . Alcohol Use: No   OB History     Gravida Para Term Preterm AB TAB SAB Ectopic Multiple Living   3 3        3      Review of Systems A 10 point review of systems was completed and was negative except for pertinent positives and negatives as mentioned in the history of present illness     Allergies  Review of patient's allergies indicates no known allergies.  Home Medications   Prior to Admission medications   Medication Sig Start Date End Date Taking? Authorizing Provider  doxycycline (VIBRAMYCIN) 100 MG capsule Take 1 capsule (100 mg total) by mouth 2 (two) times daily. 03/14/14   Varney Biles, MD  labetalol (NORMODYNE) 300 MG tablet Take 300 mg by mouth daily.    Historical Provider, MD  metroNIDAZOLE (METROGEL) 0.75 % gel Apply 1 application topically 2 (two) times a week.    Historical Provider, MD  mycophenolate (MYFORTIC) 180 MG EC tablet Take 360 mg by mouth 2 (two) times daily.     Historical Provider, MD  omeprazole (PRILOSEC) 20 MG capsule Take 20 mg by mouth daily.  04/05/12   Historical Provider, MD  ondansetron (ZOFRAN ODT) 8 MG disintegrating tablet Take 1 tablet (8 mg total) by mouth every 8 (eight) hours as needed for nausea. 03/14/14   Varney Biles, MD  predniSONE (DELTASONE) 5 MG tablet Take 10 mg by mouth daily.     Historical Provider, MD  Pseudoeph-Doxylamine-DM-APAP (NYQUIL PO) Take 2 capsules  by mouth as needed (headaches).    Historical Provider, MD  sulfamethoxazole-trimethoprim (BACTRIM,SEPTRA) 400-80 MG per tablet Take 1 tablet by mouth See admin instructions. Monday Wednesday Friday 05/31/12   Historical Provider, MD  tacrolimus (PROGRAF) 0.5 MG capsule Take 0.5 mg by mouth 2 (two) times daily. Total of 1.5mg  BID    Historical Provider, MD  tacrolimus (PROGRAF) 1 MG capsule Take 1 mg by mouth 2 (two) times daily. Total of 1.5mg  BID    Historical Provider, MD  traMADol (ULTRAM) 50 MG tablet Take 1 tablet (50 mg total) by mouth every 6 (six) hours as needed. 03/14/14   Ankit Kathrynn Humble, MD   BP  111/68 mmHg  Pulse 65  Temp(Src) 98 F (36.7 C) (Oral)  Resp 18  SpO2 100%  LMP 09/13/2014 Physical Exam  Constitutional: She is oriented to person, place, and time. She appears well-developed and well-nourished.  HENT:  Head: Normocephalic and atraumatic.  Mouth/Throat: Oropharynx is clear and moist.  Eyes: Conjunctivae are normal. Pupils are equal, round, and reactive to light. Right eye exhibits no discharge. Left eye exhibits no discharge. No scleral icterus.  Neck: Normal range of motion. Neck supple.  Cardiovascular: Normal rate, regular rhythm and normal heart sounds.   Pulmonary/Chest: Effort normal and breath sounds normal. No respiratory distress. She has no wheezes. She has no rales.  Abdominal: Soft. There is no tenderness.  Abdomen is soft, nondistended, nontender. No rebound or guarding. No CVA tenderness. Midline surgical scar noted.  Musculoskeletal: She exhibits no tenderness.  Neurological: She is alert and oriented to person, place, and time.  Cranial Nerves II-XII grossly intact  Skin: Skin is warm and dry. No rash noted.  Psychiatric: She has a normal mood and affect.  Nursing note and vitals reviewed.   ED Course  Procedures (including critical care time) Labs Review Labs Reviewed  CBC WITH DIFFERENTIAL/PLATELET - Abnormal; Notable for the following:    RBC 3.21 (*)    Hemoglobin 9.2 (*)    HCT 27.4 (*)    Lymphocytes Relative 8 (*)    Lymphs Abs 0.5 (*)    Eosinophils Relative 21 (*)    Eosinophils Absolute 1.2 (*)    All other components within normal limits  COMPREHENSIVE METABOLIC PANEL - Abnormal; Notable for the following:    Sodium 133 (*)    CO2 14 (*)    BUN 105 (*)    Creatinine, Ser 17.64 (*)    Albumin 3.4 (*)    GFR calc non Af Amer 2 (*)    GFR calc Af Amer 3 (*)    All other components within normal limits  URINALYSIS, ROUTINE W REFLEX MICROSCOPIC - Abnormal; Notable for the following:    Color, Urine RED (*)    APPearance CLOUDY  (*)    Hgb urine dipstick LARGE (*)    Protein, ur 100 (*)    All other components within normal limits  URINE MICROSCOPIC-ADD ON - Abnormal; Notable for the following:    Squamous Epithelial / LPF FEW (*)    All other components within normal limits  LIPASE, BLOOD  POC URINE PREG, ED    Imaging Review No results found.   EKG Interpretation None     Meds given in ED:  Medications  sodium chloride 0.9 % bolus 500 mL (not administered)    New Prescriptions   No medications on file   Filed Vitals:   09/17/14 1406 09/17/14 1739 09/17/14 1745 09/17/14 1800  BP: 103/60 106/66  101/60 111/68  Pulse: 82 66 64 65  Temp: 98 F (36.7 C)     TempSrc: Oral     Resp: 18 18    SpO2: 97% 100% 100% 100%    MDM  Vitals stable - WNL -afebrile Pt resting comfortably in ED. Zofran for mild nausea. PE--benign abdominal exam Labwork--Cr. 17.64, BUN 108. Pt baseline Cr is 1.5-3.0.  Has received 578ml fluid bolus. No evidence of UTI--pt menstruating, no hyperkalemia, no gap.  Consult IM for admission for AKI. Page out to Nephrology, waiting return call. Pt admitted to medicine Prior to admission, I discussed and reviewed this case with Dr. Tamera Punt.  Final diagnoses:  AKI (acute kidney injury)        Verl Dicker, PA-C 09/18/14 [place], MD 09/24/14 [phone]

## 2014-09-17 NOTE — H&P (Signed)
[facility] Admission History and Physical Service Pager: [phone]  Patient name: Candace Griffin Medical record number: [id number] Date of birth: September 24, 1975 Age: 39 y.o. Gender: female  Primary Care Provider: Archie Patten, MD Consultants: Nephrology Code Status: Full (discussed on admission)  Chief Complaint: AKI with h/o Renal Transplant  Assessment and Plan: Candace Griffin is a 39 y.o. female presenting with fatigue, nausea, vomiting, and increased thirst and noted to have Creatinine of 17.64. PMH is significant for ESRD s/p deceased donor kidney transplant 09/30/2011, HTN, and GERD.   # Acute Kidney Injury- History of kidney transplant 09/2011. Home medications include CellCept 360mg  BID, Prednisone 10mg , and Prograf 1.5mg  BID. Baseline Creatinine 1.5-3. Creatinine noted to be 17.64 and BUN 105 at admission. Potassium normal at 4.4. Urinalysis red and cloudy in appearance with 100 proteins and large hemoglobin. Afebrile and vital signs within normal limits. The concern in this case would be for rejection vs failure of her transplanted kidney. Less likely related to dehydration. No somnolence or decreased alertness meaning no need for emergent dialysis, though does exhibit nausea, vomiting, and fatigue likely related to uremia. Mild anion gap acidosis with bicarb of 14 and AG of 14, likely related to uremia.  - Telemetry - F/U CellCept level, Tacrolimus level - F/U am labs: BMP, CBC - Nephrology consulted. Appreciate recommendations. Await recs on further management. - Holding home transplant medications pending Nephrology consult - Zofran PRN nausea  # Anemia- Hemoglobin noted to be 9.2 at admission. - F/U CBC in am - Continue to monitor - iron panel  # Hypertension- Home medications include Labetalol 300mg . Normotensive currently - Holding home medication. Consider restarting once appropriate.  FEN/GI: Renal Diet with 1255mL fluid  restriction Prophylaxis: Subcutaneous Heparin  Disposition: Admitted to [facility], [facility] attending. Discharge pending improvement of renal status.  History of Present Illness: Candace Griffin is a 39 y.o. female presenting with increasing fatigue, nausea, and vomiting and noted to have Creatinine of 17.64 in the Emergency Department. History of renal transplant on right 3 years ago. First noted fatigue two weeks ago, but initially believed it was due to working two jobs. Noted nausea and increased thirst four days ago. Vomiting started this morning with three episodes of emesis today. Describes emesis as green and did not note any blood. Denies abdominal pain, flank pain, or fevers. Denies burning with urination. Reports decrease in urination for three days despite increase in PO intake. Admits she has missed 2-3 days intermittently of her transplant medications due to nausea; took medications yesterday, but has not taken them today or on 1/23. States she was told in the past that she had a high likelihood of rejecting her kidney. Usually follows with Dr. Justin Mend every three months and believes her next appointment is in one month.  Review Of Systems: Per HPI  Otherwise 12 point review of systems was performed and was unremarkable.  Patient Active Problem List   Diagnosis Date Noted  . AKI (acute kidney injury) 09/17/2014  . Low grade squamous intraepithelial lesion (LGSIL) on cervical Pap smear 05/03/2014  . Pyelonephritis 11/02/2013  . Genital warts 07/31/2013  . Atypical squamous cell changes of undetermined significance (ASCUS) on cervical cytology with positive high risk human papilloma virus (HPV) 07/19/2013  . Vaginal discharge 03/01/2013  . Dysuria 03/01/2013  . Right ankle pain 12/13/2012  . Hypertension 04/21/2012  . Well woman exam 04/21/2012  . History of renal transplant 01/07/2012  . Cerumen impaction 12/29/2011  .  Bacterial vaginosis 04/29/2011  . GERD  10/10/2009  . ANEMIA 08/11/2007  . GENITAL HERPES, HX OF 08/11/2007  . End-stage renal disease needing dialysis 10/21/2006   Past Medical History: Past Medical History  Diagnosis Date  . Dialysis patient   . Hypertension   . History of renal transplant October 29, 2011  . GERD (gastroesophageal reflux disease)    Past Surgical History: Past Surgical History  Procedure Laterality Date  . Diaylsis shunt    . Kidney transplant  10-29-2011    deceased donor kidney   Social History: History  Substance Use Topics  . Smoking status: Never Smoker   . Smokeless tobacco: Never Used  . Alcohol Use: No   Please also refer to relevant sections of EMR.  Family History: Family History  Problem Relation Age of Onset  . Diabetes Father   . Diabetes Paternal Aunt   . Alcohol abuse Paternal Uncle   . Cancer Maternal Grandmother     breast  . Diabetes Paternal Grandmother   . Alcohol abuse Paternal Grandmother   . Alcohol abuse Paternal Grandfather    Allergies and Medications: No Known Allergies No current facility-administered medications on file prior to encounter.   Current Outpatient Prescriptions on File Prior to Encounter  Medication Sig Dispense Refill  . doxycycline (VIBRAMYCIN) 100 MG capsule Take 1 capsule (100 mg total) by mouth 2 (two) times daily. 28 capsule 0  . labetalol (NORMODYNE) 300 MG tablet Take 300 mg by mouth daily.    . metroNIDAZOLE (METROGEL) 0.75 % gel Apply 1 application topically 2 (two) times a week.    . mycophenolate (MYFORTIC) 180 MG EC tablet Take 360 mg by mouth 2 (two) times daily.     Marland Kitchen omeprazole (PRILOSEC) 20 MG capsule Take 20 mg by mouth daily.     . ondansetron (ZOFRAN ODT) 8 MG disintegrating tablet Take 1 tablet (8 mg total) by mouth every 8 (eight) hours as needed for nausea. 20 tablet 0  . predniSONE (DELTASONE) 5 MG tablet Take 10 mg by mouth daily.     . Pseudoeph-Doxylamine-DM-APAP (NYQUIL PO) Take 2 capsules by mouth as needed (headaches).     . sulfamethoxazole-trimethoprim (BACTRIM,SEPTRA) 400-80 MG per tablet Take 1 tablet by mouth See admin instructions. Monday Wednesday Friday    . tacrolimus (PROGRAF) 0.5 MG capsule Take 0.5 mg by mouth 2 (two) times daily. Total of 1.5mg  BID    . tacrolimus (PROGRAF) 1 MG capsule Take 1 mg by mouth 2 (two) times daily. Total of 1.5mg  BID    . traMADol (ULTRAM) 50 MG tablet Take 1 tablet (50 mg total) by mouth every 6 (six) hours as needed. 15 tablet 0    Objective: BP 96/55 mmHg  Pulse 76  Temp(Src) 98 F (36.7 C) (Oral)  Resp 18  SpO2 100%  LMP 09/13/2014 Exam: General: 39yo female resting comfortably, tearful during exam HEENT: Dry mucous membranes. PERRLA. Cardiovascular: S1 and S2 noted. Systolic murmur noted. Regular rate and rhythm. Respiratory: Clear to auscultation bilaterally. No wheezing/rales/rhonchi. No increased work of breathing. Abdomen: Bowel sounds noted. Soft and nondistended. Minimal tenderness in RLQ (per patient over transplanted kidney), no guarding or rebound Extremities: No edema noted. Pulses palpable. Right forearm AV fistula with good thrill and bruit. Skin: No rashes noted. Warm. Neuro: No focal deficits. AAO x3  Labs and Imaging: CBC BMET   Recent Labs Lab 09/17/14 1421  WBC 5.8  HGB 9.2*  HCT 27.4*  PLT 279    Recent Labs Lab  09/17/14 1421  NA 133*  K 4.4  CL 105  CO2 14*  BUN 105*  CREATININE 17.64*  GLUCOSE 88  CALCIUM 8.9     Urinalysis    Component Value Date/Time   COLORURINE RED* 09/17/2014 1719   APPEARANCEUR CLOUDY* 09/17/2014 1719   LABSPEC 1.010 09/17/2014 1719   PHURINE 6.0 09/17/2014 1719   GLUCOSEU NEGATIVE 09/17/2014 1719   HGBUR LARGE* 09/17/2014 1719   HGBUR moderate 02/03/2010 1321   BILIRUBINUR NEGATIVE 09/17/2014 1719   BILIRUBINUR negative 05/31/2013 0920   KETONESUR NEGATIVE 09/17/2014 1719   PROTEINUR 100* 09/17/2014 1719   PROTEINUR 30 05/31/2013 0920   UROBILINOGEN 0.2 09/17/2014 1719    UROBILINOGEN 0.2 05/31/2013 0920   NITRITE NEGATIVE 09/17/2014 1719   NITRITE negative 05/31/2013 0920   LEUKOCYTESUR NEGATIVE 09/17/2014 1719  - Pregnancy Test- Negative  Lorna Few, DO 09/17/2014, 8:16 PM PGY-1, [facility] pager: [phone], text pages welcome  Upper Level Addendum:  I have seen and evaluated this patient along with Dr. Gerlean Ren and reviewed the above note, making necessary revisions in red.   Tommi Rumps, MD Family Medicine PGY-2

## 2014-09-17 NOTE — Consult Note (Signed)
[facility] Renal Consultation Note  Requesting MD: Family medicine teaching service Indication for Consultation: Elevated creatinine in a transplant patient  HPI:  Candace Griffin is a 39 y.o. female with past medical history significant for end-stage renal disease etiology not known by me. She is status post a deceased donor kidney transplant in February 2013. She tells me that when she went for her yearly visit at [facility] that they told her the kidney would only last from 3-5 years. She sees Dr. Justin Mend at [facility]. He notes her to have chronic allograft nephropathy with a creatinine of 2 back in July 2015.  She had an office visit in October but no labs were done at that time. Therefore, there is been a six-month gap in labs.  Patient has continue to live her life, working 2 jobs and taking care of her 3 children. She states that she has been compliant with her antirejection medications. She reports approximately 2 weeks ago having a lot of tiredness. However she felt this was not unusual due to the fact that she was working 2 jobs and her menstrual cycle was coming on. She then reports for the last 4 days she's been very ill not being able to do anything, not being able to go to work, vomiting with excessive fatigue. She also states that she did not take her rejection medications during that time. She now presents to the emergency department with these complaints with a creatinine of 17. She has too numerous to count red blood cells in her urine but she states this is because her menstrual cycle. She has no tenderness to her kidney allograft.  She denies lower extremity edema. She is obviously emotional regarding these events.   CREATININE  Date/Time Value Ref Range Status  12/19/2007 06:45 AM 3.59*  Final  12/07/2007 03:20 PM 3.43*  Final   CREATININE, SER  Date/Time Value Ref Range Status  09/17/2014 02:21 PM 17.64* 0.50 - 1.10 mg/dL Final  03/14/2014 03:45 PM  2.06* 0.50 - 1.10 mg/dL Final  11/03/2013 03:49 AM 1.89* 0.50 - 1.10 mg/dL Final  11/02/2013 06:20 AM 2.46* 0.50 - 1.10 mg/dL Final  11/01/2013 06:35 PM 3.05* 0.50 - 1.10 mg/dL Final  02/03/2010 03:25 PM 12.05* 0.40-1.20 mg/dL Final  09/04/2009 09:26 AM 9.0 mg/dL   05/20/2009 01:14 PM 8.88* 0.4 - 1.2 mg/dL Final  05/06/2009 12:57 PM 8.83* 0.4 - 1.2 mg/dL Final  03/20/2009 02:00 PM 9.75* 0.4 - 1.2 mg/dL Final  01/02/2009 01:30 PM 10.60* 0.4 - 1.2 mg/dL Final  01/13/2008 05:32 AM 3.89*  Final  01/12/2008 05:51 AM 3.47*  Final  01/11/2008 05:58 AM 3.52*  Final  01/09/2008 05:30 AM 3.66*  Final  01/09/2008 05:30 AM 3.64*  Final  01/06/2008 05:48 AM 3.48*  Final  01/05/2008 05:05 AM 3.42*  Final  01/04/2008 05:47 AM 3.39*  Final  01/02/2008 05:05 AM 3.27*  Final  12/30/2007 05:27 AM 3.59*  Final  12/29/2007 05:50 AM 3.49*  Final  12/28/2007 05:50 AM 3.52*  Final  12/26/2007 05:47 AM 3.50*  Final  12/23/2007 06:40 AM 3.54*  Final  12/22/2007 05:59 AM 3.40*  Final  12/21/2007 05:00 AM 3.54*  Final  12/19/2007 05:45 AM 3.59*  Final  12/19/2007 05:45 AM 3.59*  Final  12/16/2007 06:00 AM 3.36*  Final  12/15/2007 07:05 AM 3.48*  Final  12/12/2007 05:10 AM 3.20*  Final  12/09/2007 06:02 AM 3.48*  Final  12/08/2007 07:10 AM 3.43*  Final  12/07/2007 05:20  PM 3.36*  Final     PMHx:   Past Medical History  Diagnosis Date  . Dialysis patient   . Hypertension   . History of renal transplant 10/27/11  . GERD (gastroesophageal reflux disease)     Past Surgical History  Procedure Laterality Date  . Diaylsis shunt    . Kidney transplant  10/27/11    deceased donor kidney    Family Hx:  Family History  Problem Relation Age of Onset  . Diabetes Father   . Diabetes Paternal Aunt   . Alcohol abuse Paternal Uncle   . Cancer Maternal Grandmother     breast  . Diabetes Paternal Grandmother   . Alcohol abuse Paternal Grandmother   . Alcohol abuse Paternal Grandfather     Social  History:  reports that she has never smoked. She has never used smokeless tobacco. She reports that she does not drink alcohol or use illicit drugs.  Allergies: No Known Allergies  Medications: Prior to Admission medications   Medication Sig Start Date End Date Taking? Authorizing Provider  doxycycline (VIBRAMYCIN) 100 MG capsule Take 1 capsule (100 mg total) by mouth 2 (two) times daily. 03/14/14   Varney Biles, MD  labetalol (NORMODYNE) 300 MG tablet Take 300 mg by mouth daily.    Historical Provider, MD  metroNIDAZOLE (METROGEL) 0.75 % gel Apply 1 application topically 2 (two) times a week.    Historical Provider, MD  mycophenolate (MYFORTIC) 180 MG EC tablet Take 360 mg by mouth 2 (two) times daily.     Historical Provider, MD  omeprazole (PRILOSEC) 20 MG capsule Take 20 mg by mouth daily.  04/05/12   Historical Provider, MD  ondansetron (ZOFRAN ODT) 8 MG disintegrating tablet Take 1 tablet (8 mg total) by mouth every 8 (eight) hours as needed for nausea. 03/14/14   Varney Biles, MD  predniSONE (DELTASONE) 5 MG tablet Take 10 mg by mouth daily.     Historical Provider, MD  Pseudoeph-Doxylamine-DM-APAP (NYQUIL PO) Take 2 capsules by mouth as needed (headaches).    Historical Provider, MD  sulfamethoxazole-trimethoprim (BACTRIM,SEPTRA) 400-80 MG per tablet Take 1 tablet by mouth See admin instructions. Monday Wednesday Friday 05/31/12   Historical Provider, MD  tacrolimus (PROGRAF) 0.5 MG capsule Take 0.5 mg by mouth 2 (two) times daily. Total of 1.19m BID    Historical Provider, MD  tacrolimus (PROGRAF) 1 MG capsule Take 1 mg by mouth 2 (two) times daily. Total of 1.566mBID    Historical Provider, MD  traMADol (ULTRAM) 50 MG tablet Take 1 tablet (50 mg total) by mouth every 6 (six) hours as needed. 03/14/14   AnVarney BilesMD    I have reviewed the patient's current medications.  Labs:  Results for orders placed or performed during the hospital encounter of 09/17/14 (from the past 48  hour(s))  CBC with Differential     Status: Abnormal   Collection Time: 09/17/14  2:21 PM  Result Value Ref Range   WBC 5.8 4.0 - 10.5 K/uL   RBC 3.21 (L) 3.87 - 5.11 MIL/uL   Hemoglobin 9.2 (L) 12.0 - 15.0 g/dL   HCT 27.4 (L) 36.0 - 46.0 %   MCV 85.4 78.0 - 100.0 fL   MCH 28.7 26.0 - 34.0 pg   MCHC 33.6 30.0 - 36.0 g/dL   RDW 12.7 11.5 - 15.5 %   Platelets 279 150 - 400 K/uL   Neutrophils Relative % 59 43 - 77 %   Neutro Abs 3.5 1.7 -  7.7 K/uL   Lymphocytes Relative 8 (L) 12 - 46 %   Lymphs Abs 0.5 (L) 0.7 - 4.0 K/uL   Monocytes Relative 11 3 - 12 %   Monocytes Absolute 0.6 0.1 - 1.0 K/uL   Eosinophils Relative 21 (H) 0 - 5 %   Eosinophils Absolute 1.2 (H) 0.0 - 0.7 K/uL   Basophils Relative 1 0 - 1 %   Basophils Absolute 0.0 0.0 - 0.1 K/uL  Comprehensive metabolic panel     Status: Abnormal   Collection Time: 09/17/14  2:21 PM  Result Value Ref Range   Sodium 133 (L) 135 - 145 mmol/L   Potassium 4.4 3.5 - 5.1 mmol/L   Chloride 105 96 - 112 mmol/L   CO2 14 (L) 19 - 32 mmol/L   Glucose, Bld 88 70 - 99 mg/dL   BUN 105 (H) 6 - 23 mg/dL   Creatinine, Ser 17.64 (H) 0.50 - 1.10 mg/dL   Calcium 8.9 8.4 - 10.5 mg/dL   Total Protein 7.9 6.0 - 8.3 g/dL   Albumin 3.4 (L) 3.5 - 5.2 g/dL   AST 17 0 - 37 U/L   ALT 13 0 - 35 U/L   Alkaline Phosphatase 81 39 - 117 U/L   Total Bilirubin 0.6 0.3 - 1.2 mg/dL   GFR calc non Af Amer 2 (L) >90 mL/min   GFR calc Af Amer 3 (L) >90 mL/min    Comment: (NOTE) The eGFR has been calculated using the CKD EPI equation. This calculation has not been validated in all clinical situations. eGFR's persistently <90 mL/min signify possible Chronic Kidney Disease.    Anion gap 14 5 - 15  Lipase, blood     Status: None   Collection Time: 09/17/14  2:21 PM  Result Value Ref Range   Lipase 57 11 - 59 U/L  Urinalysis, Routine w reflex microscopic     Status: Abnormal   Collection Time: 09/17/14  5:19 PM  Result Value Ref Range   Color, Urine RED (A)  YELLOW    Comment: BIOCHEMICALS MAY BE AFFECTED BY COLOR   APPearance CLOUDY (A) CLEAR   Specific Gravity, Urine 1.010 1.005 - 1.030   pH 6.0 5.0 - 8.0   Glucose, UA NEGATIVE NEGATIVE mg/dL   Hgb urine dipstick LARGE (A) NEGATIVE   Bilirubin Urine NEGATIVE NEGATIVE   Ketones, ur NEGATIVE NEGATIVE mg/dL   Protein, ur 100 (A) NEGATIVE mg/dL   Urobilinogen, UA 0.2 0.0 - 1.0 mg/dL   Nitrite NEGATIVE NEGATIVE   Leukocytes, UA NEGATIVE NEGATIVE  Urine microscopic-add on     Status: Abnormal   Collection Time: 09/17/14  5:19 PM  Result Value Ref Range   Squamous Epithelial / LPF FEW (A) RARE   WBC, UA 3-6 <3 WBC/hpf   RBC / HPF TOO NUMEROUS TO COUNT <3 RBC/hpf   Bacteria, UA RARE RARE   Urine-Other URINALYSIS PERFORMED ON SUPERNATANT   POC Urine Pregnancy, ED  (If Pre-menopausal female) - do not order at [facility]     Status: None   Collection Time: 09/17/14  5:27 PM  Result Value Ref Range   Preg Test, Ur NEGATIVE NEGATIVE    Comment:        THE SENSITIVITY OF THIS METHODOLOGY IS >24 mIU/mL      ROS:  A comprehensive review of systems was negative except for: Constitutional: positive for anorexia and fatigue Gastrointestinal: positive for nausea and vomiting  Physical Exam: Filed Vitals:   09/17/14 2000  BP: 96/55  Pulse: 76  Temp:   Resp:      General: A fairly well appearing black female who is talkative, emotional and in no acute distress HEENT: Pupils are equal round reactive to light, extra motions are intact, mucous members are moist Neck: There is no jugular venous distention Heart: Regular rate and rhythm without murmur, gallop, or rub Lungs: Mostly clear Abdomen: Soft, nontender, nondistended. There is no allograft in the right lower quadrant which is nontender Extremities: 0 to trace edema bilaterally Skin: Warm and dry Neuro: Seems intact She has a right forearm AV fistula with good thrill and bruit  Assessment/Plan: 39 year old black female status post deceased  donor kidney transplant in 2013. She had some chronic allograft nephropathy with creatinine of around 2.0  6 months ago. She now presents with a creatinine of 17 with uremic symptoms 1.Renal- known history of chronic allograft nephropathy and a creatinine of 2.0  6 months ago.  No labs in the past 6 months. She has no tenderness over the kidney to indicate acute rejection or obstruction. These would be things that might be reversible. I will check a renal ultrasound to rule out occult obstruction but what I feel is that this has been a slow smoldering process probably over the last 6 months that finally reached a level of this kidney really not working at all. I'm afraid there is very little hope for recovery and I told her I think the best course of action is to just reinitiate dialysis. She is appropriately tearful during this conversation. We'll plan for dialysis treatment via her fistula tomorrow.  We'll continue her immunosuppressants for now will need to be weaned as OP 2. Hypertension/volume  - actually states she seems a little bit dry to me at this time. Probably due to her uremia nausea and vomiting. We can hydrate her, however again with a creatinine of 17 I don't feel that this is reversible.  Will hold her labetalol 3. Anemia  - likely anemia related to chronic kidney disease. Will check iron stores and start Aranesp   Yukiko Minnich A 09/17/2014, 9:35 PM

## 2014-09-17 NOTE — Progress Notes (Signed)
New Admission Note:   Arrival Method: via stretcher from ED Mental Orientation: Alert and Oriented x4 Telemetry: Placed on box 6E15 per MD orders Assessment: Completed Skin: Intact, warm, and dry IV: Left AC infusing Normal Saline @ 75cc/hr per MD Orders Pain: Denies Tubes: N/A Safety Measures: Educated on fall prevention safety plan, patient acknowledged and understood. Admission: Completed [address] Orientation: Patient has been orientated to the room, unit and staff.  Family: N/A  Orders have been reviewed and implemented. Will continue to monitor the patient. Call light has been placed within reach and bed alarm has been activated.    Dorothea Glassman, RN  Phone number: [phone]

## 2014-09-17 NOTE — ED Notes (Signed)
Pt reports having n/v this am. Having nausea for several days and thinks she is dehydrated, very fatigued. Denies diarrhea.

## 2014-09-18 ENCOUNTER — Inpatient Hospital Stay (HOSPITAL_COMMUNITY): Payer: Medicare Other

## 2014-09-18 DIAGNOSIS — I10 Essential (primary) hypertension: Secondary | ICD-10-CM

## 2014-09-18 DIAGNOSIS — N179 Acute kidney failure, unspecified: Principal | ICD-10-CM

## 2014-09-18 DIAGNOSIS — D649 Anemia, unspecified: Secondary | ICD-10-CM

## 2014-09-18 LAB — RENAL FUNCTION PANEL
Albumin: 2.9 g/dL — ABNORMAL LOW (ref 3.5–5.2)
Anion gap: 16 — ABNORMAL HIGH (ref 5–15)
BUN: 107 mg/dL — ABNORMAL HIGH (ref 6–23)
CO2: 11 mmol/L — ABNORMAL LOW (ref 19–32)
Calcium: 8.2 mg/dL — ABNORMAL LOW (ref 8.4–10.5)
Chloride: 109 mmol/L (ref 96–112)
Creatinine, Ser: 17.88 mg/dL — ABNORMAL HIGH (ref 0.50–1.10)
GFR calc Af Amer: 3 mL/min — ABNORMAL LOW (ref >90)
GFR calc non Af Amer: 2 mL/min — ABNORMAL LOW (ref >90)
Glucose, Bld: 89 mg/dL (ref 70–99)
Phosphorus: 10.3 mg/dL — ABNORMAL HIGH (ref 2.3–4.6)
Potassium: 4 mmol/L (ref 3.5–5.1)
Sodium: 136 mmol/L (ref 135–145)

## 2014-09-18 LAB — BASIC METABOLIC PANEL
Anion gap: 16 — ABNORMAL HIGH (ref 5–15)
BUN: 108 mg/dL — ABNORMAL HIGH (ref 6–23)
CO2: 12 mmol/L — ABNORMAL LOW (ref 19–32)
Calcium: 8.7 mg/dL (ref 8.4–10.5)
Chloride: 108 mmol/L (ref 96–112)
Creatinine, Ser: 18.23 mg/dL — ABNORMAL HIGH (ref 0.50–1.10)
GFR calc Af Amer: 2 mL/min — ABNORMAL LOW (ref >90)
GFR calc non Af Amer: 2 mL/min — ABNORMAL LOW (ref >90)
Glucose, Bld: 93 mg/dL (ref 70–99)
Potassium: 4.3 mmol/L (ref 3.5–5.1)
Sodium: 136 mmol/L (ref 135–145)

## 2014-09-18 LAB — HEPATITIS B CORE ANTIBODY, IGM: Hep B C IgM: NONREACTIVE

## 2014-09-18 LAB — IRON AND TIBC
Iron: 46 ug/dL (ref 42–145)
Saturation Ratios: 25 % (ref 20–55)
TIBC: 187 ug/dL — ABNORMAL LOW (ref 250–470)
UIBC: 141 ug/dL (ref 125–400)

## 2014-09-18 LAB — FERRITIN: Ferritin: 193 ng/mL (ref 10–291)

## 2014-09-18 LAB — CBC
HCT: 25.8 % — ABNORMAL LOW (ref 36.0–46.0)
Hemoglobin: 8.6 g/dL — ABNORMAL LOW (ref 12.0–15.0)
MCH: 28 pg (ref 26.0–34.0)
MCHC: 33.3 g/dL (ref 30.0–36.0)
MCV: 84 fL (ref 78.0–100.0)
Platelets: 239 10*3/uL (ref 150–400)
RBC: 3.07 MIL/uL — ABNORMAL LOW (ref 3.87–5.11)
RDW: 12.9 % (ref 11.5–15.5)
WBC: 5.7 10*3/uL (ref 4.0–10.5)

## 2014-09-18 LAB — HEPATITIS B SURFACE ANTIGEN: Hepatitis B Surface Ag: NEGATIVE

## 2014-09-18 IMAGING — US US RENAL
1 series · 14 of 25 positions shown · non-contrast
Comparison: None.

CLINICAL DATA: Evaluate transplanted kidney for obstruction

EXAM:
RENAL/URINARY TRACT ULTRASOUND COMPLETE

[Series 1: us renal · 0.22mm/px · 14 of 33 slices shown]
[im 1/33]
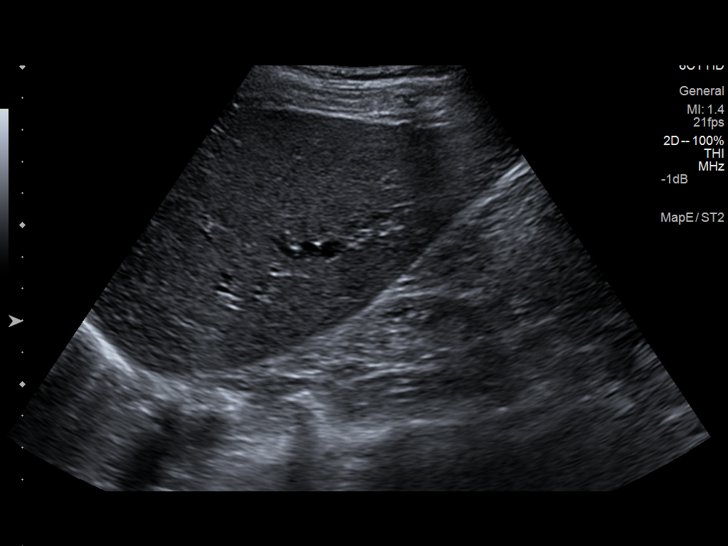
[im 3/33]
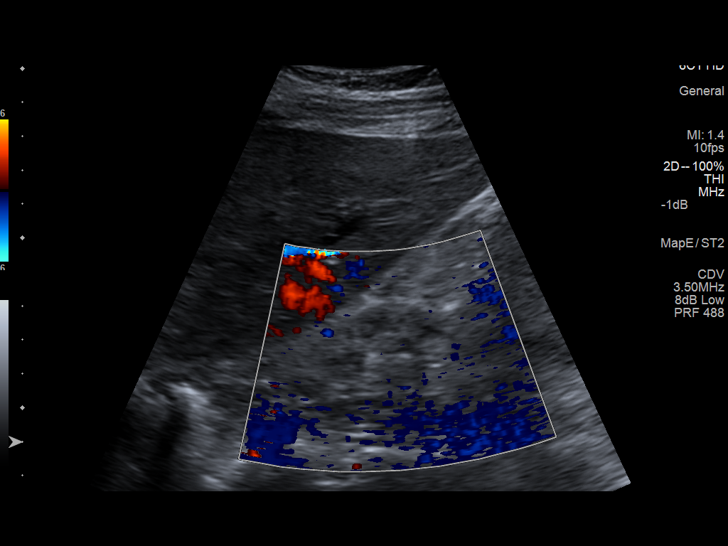
[im 6/33]
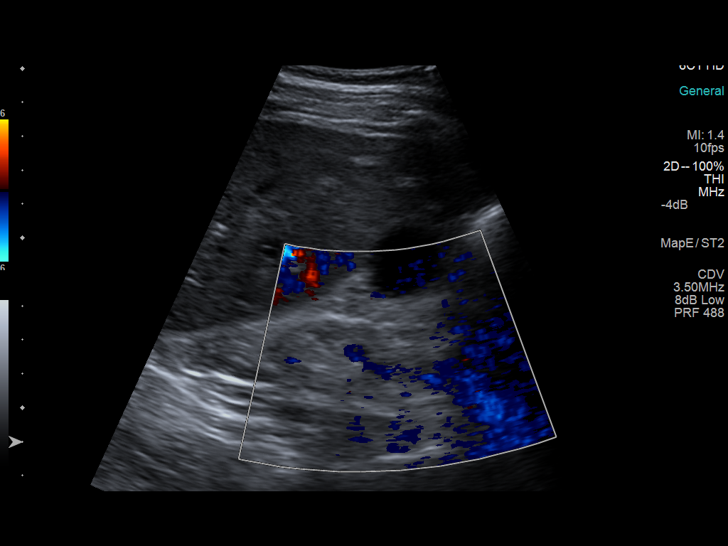
[im 9/33]
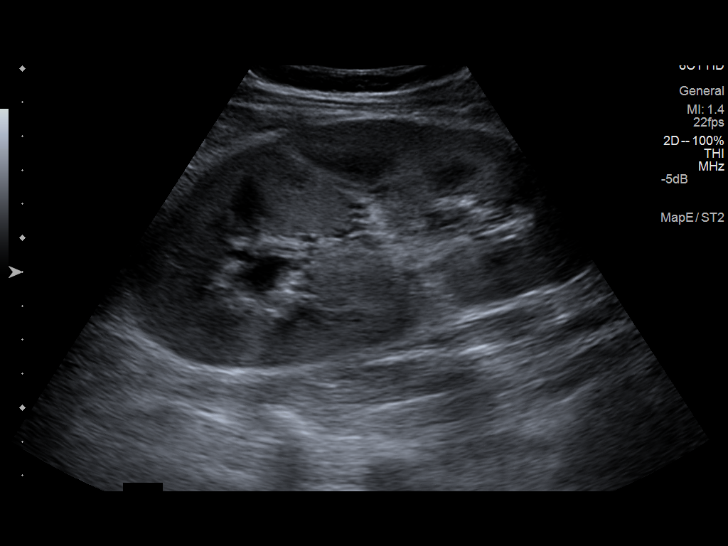
[im 11/33]
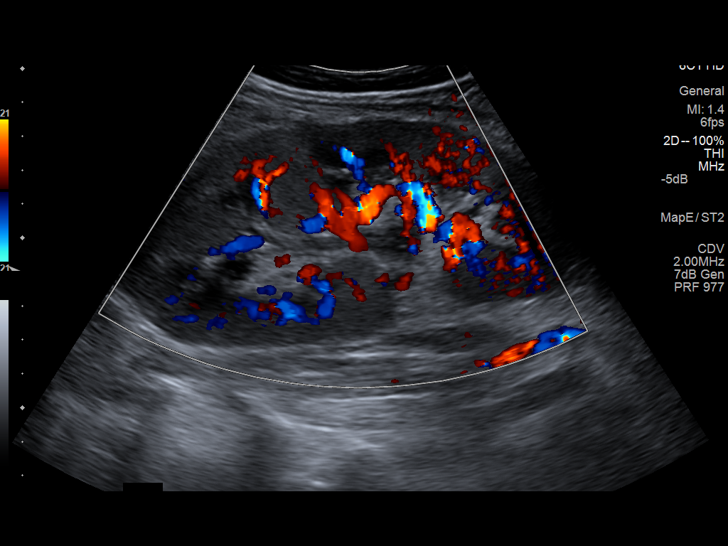
[im 13/33]
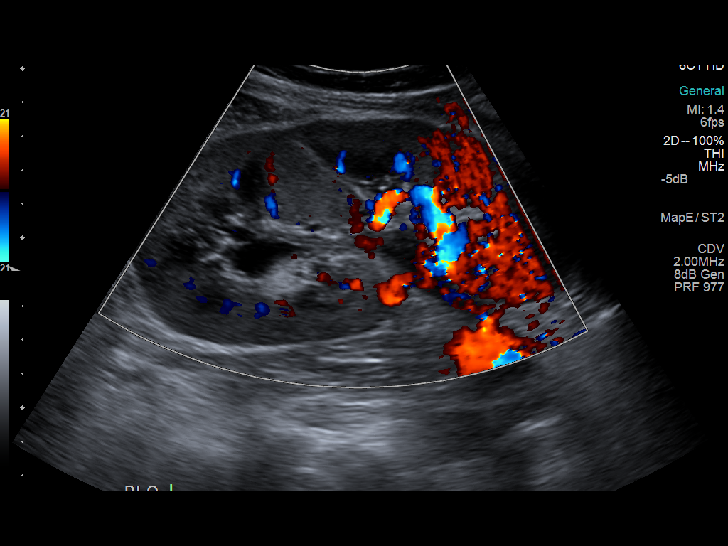
[im 15/33]
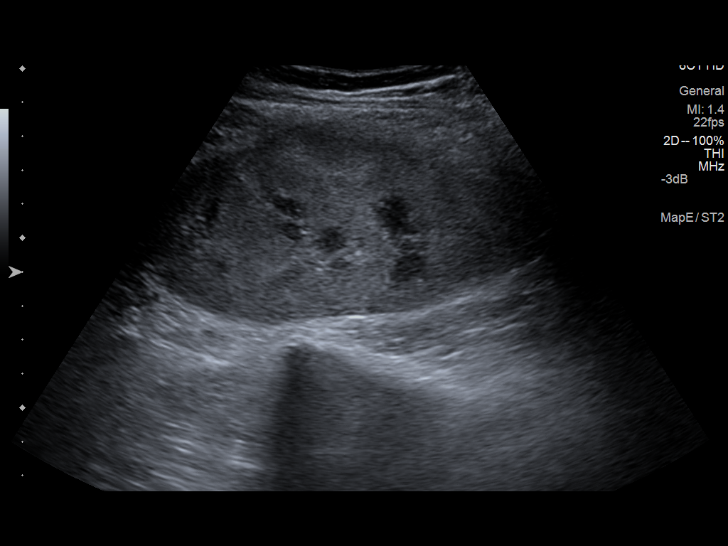
[im 18/33]
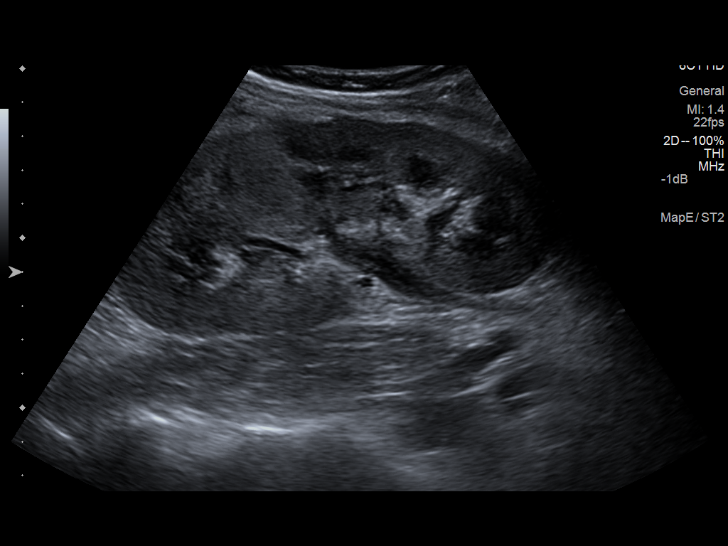
[im 21/33]
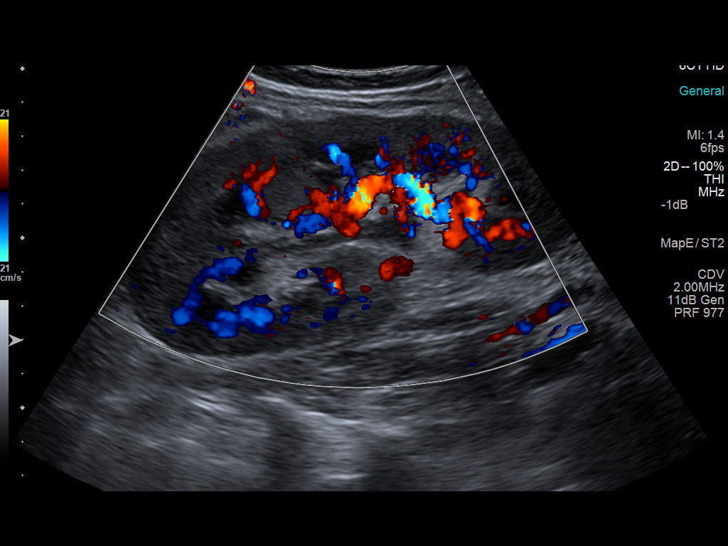
[im 22/33]
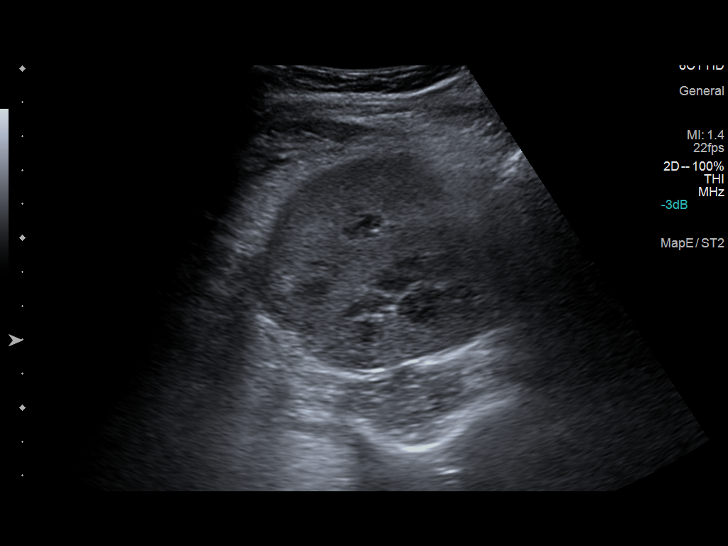
[im 25/33]
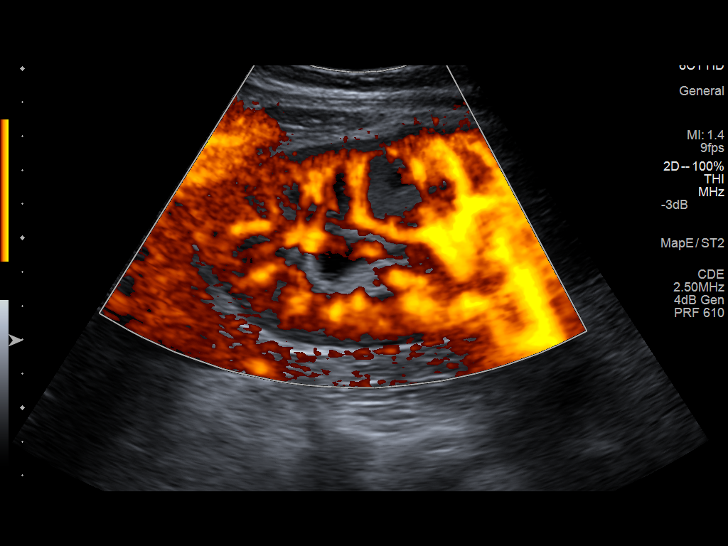
[im 27/33]
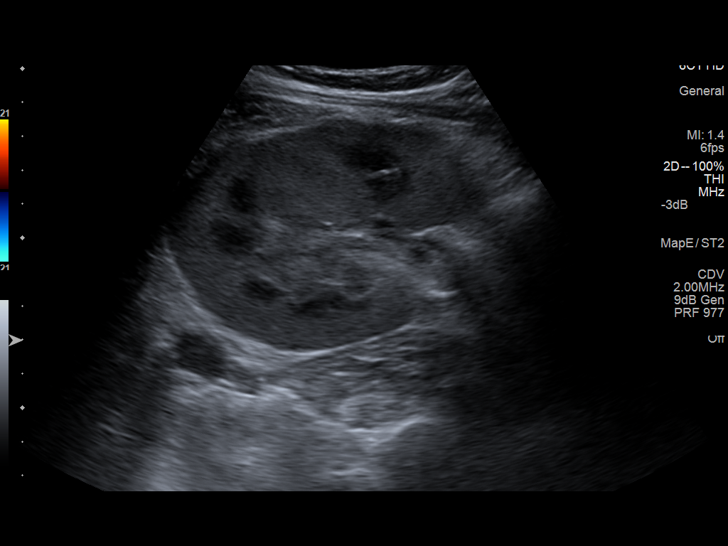
[im 30/33]
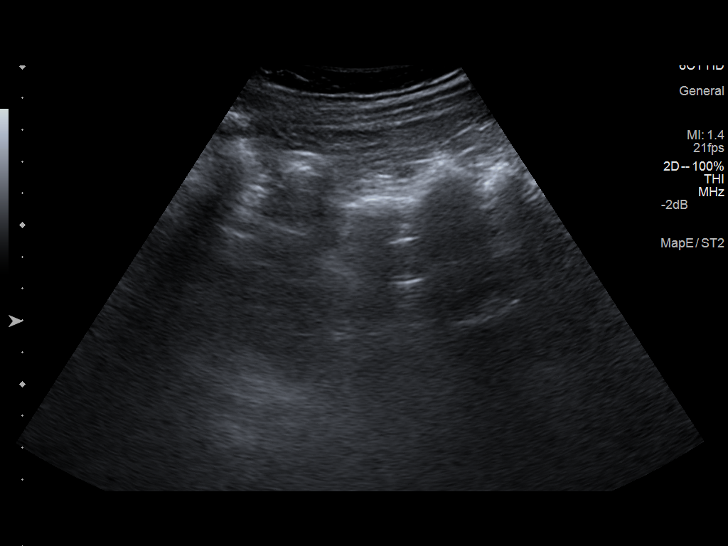
[im 33/33]
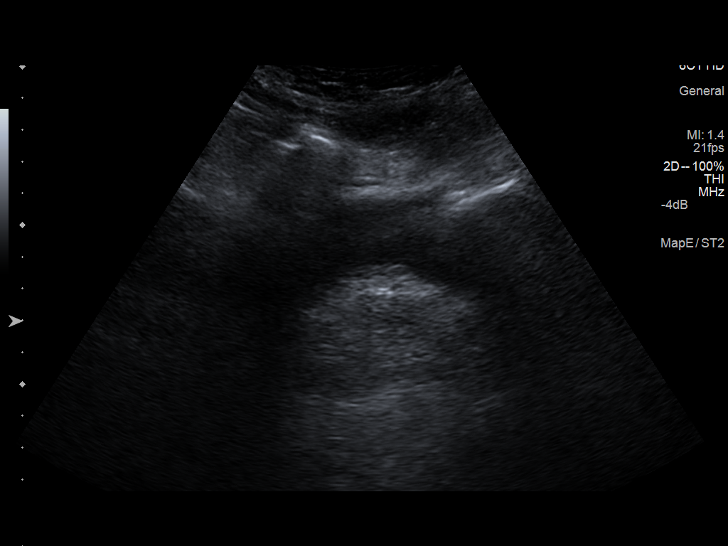

[14 of 25 positions shown; findings below may reference images not displayed]

FINDINGS: Right Kidney:

Length: 5.4 cm. There is diffuse increased echotexture of the right
kidney. No mass or hydronephrosis visualized.

Left Kidney:

Not definitely visualized.

Bladder:

The bladder is not distended, the patient voided recently limiting
evaluation.

Right lower quadrant kidney transplant is identified. The transplant
kidney measures 14.2 cm. The echotexture is normal. There is no
hydronephrosis identified in the transplanted kidney.
IMPRESSION: Right lower quadrant kidney transplant measuring 14.2 cm. There is
no hydronephrosis in the transplanted kidney.

## 2014-09-18 MED ORDER — RENA-VITE PO TABS
1.0000 | ORAL_TABLET | Freq: Every day | ORAL | Status: DC
  Administered 2014-09-18 – 2014-09-19 (×2): 1 via ORAL
  Filled 2014-09-18 (×3): qty 1

## 2014-09-18 MED ORDER — HEPARIN SODIUM (PORCINE) 1000 UNIT/ML DIALYSIS: 1000.0000 [IU] | INTRAMUSCULAR | Status: DC | PRN

## 2014-09-18 MED ORDER — PENTAFLUOROPROP-TETRAFLUOROETH EX AERO: 1.0000 "application " | INHALATION_SPRAY | CUTANEOUS | Status: DC | PRN

## 2014-09-18 MED ORDER — TRAMADOL HCL 50 MG PO TABS
100.0000 mg | ORAL_TABLET | Freq: Once | ORAL | Status: AC
  Administered 2014-09-18: 100 mg via ORAL

## 2014-09-18 MED ORDER — ALTEPLASE 2 MG IJ SOLR
2.0000 mg | Freq: Once | INTRAMUSCULAR | Status: DC | PRN
  Filled 2014-09-18: qty 2

## 2014-09-18 MED ORDER — LIDOCAINE HCL (PF) 1 % IJ SOLN: 5.0000 mL | INTRAMUSCULAR | Status: DC | PRN

## 2014-09-18 MED ORDER — TRAMADOL HCL 50 MG PO TABS
ORAL_TABLET | ORAL | Status: AC
  Filled 2014-09-18: qty 2

## 2014-09-18 MED ORDER — SODIUM CHLORIDE 0.9 % IV SOLN
500.0000 mg | INTRAVENOUS | Status: DC
  Administered 2014-09-18 – 2014-09-19 (×2): 500 mg via INTRAVENOUS
  Filled 2014-09-18 (×3): qty 4

## 2014-09-18 MED ORDER — HEPARIN SODIUM (PORCINE) 1000 UNIT/ML DIALYSIS: 20.0000 [IU]/kg | INTRAMUSCULAR | Status: DC | PRN

## 2014-09-18 MED ORDER — LIDOCAINE-PRILOCAINE 2.5-2.5 % EX CREA: 1.0000 "application " | TOPICAL_CREAM | CUTANEOUS | Status: DC | PRN

## 2014-09-18 MED ORDER — SODIUM CHLORIDE 0.9 % IV SOLN: 100.0000 mL | INTRAVENOUS | Status: DC | PRN

## 2014-09-18 MED ORDER — NEPRO/CARBSTEADY PO LIQD: 237.0000 mL | ORAL | Status: DC | PRN

## 2014-09-18 MED ORDER — HEPARIN SODIUM (PORCINE) 1000 UNIT/ML DIALYSIS: 40.0000 [IU]/kg | Freq: Once | INTRAMUSCULAR | Status: DC

## 2014-09-18 MED ORDER — ONDANSETRON HCL 4 MG/2ML IJ SOLN
INTRAMUSCULAR | Status: AC
  Filled 2014-09-18: qty 2

## 2014-09-18 MED ORDER — HEPARIN SODIUM (PORCINE) 1000 UNIT/ML DIALYSIS
1000.0000 [IU] | INTRAMUSCULAR | Status: DC | PRN
  Filled 2014-09-18: qty 1

## 2014-09-18 MED ORDER — ACETAMINOPHEN 325 MG PO TABS: 650.0000 mg | ORAL_TABLET | Freq: Four times a day (QID) | ORAL | Status: DC | PRN

## 2014-09-18 MED ORDER — DARBEPOETIN ALFA 200 MCG/0.4ML IJ SOSY
PREFILLED_SYRINGE | INTRAMUSCULAR | Status: AC
  Filled 2014-09-18: qty 0.4

## 2014-09-18 MED ORDER — ALTEPLASE 2 MG IJ SOLR: 2.0000 mg | Freq: Once | INTRAMUSCULAR | Status: DC | PRN

## 2014-09-18 MED ORDER — DARBEPOETIN ALFA 200 MCG/0.4ML IJ SOSY
200.0000 ug | PREFILLED_SYRINGE | INTRAMUSCULAR | Status: DC
  Administered 2014-09-18: 200 ug via INTRAVENOUS
  Filled 2014-09-18: qty 0.4

## 2014-09-18 NOTE — Progress Notes (Signed)
Subjective: Interval History: has complaints doesn't feel good.  Objective: Vital signs in last 24 hours: Temp:  [98 F (36.7 C)-98.5 F (36.9 C)] 98.5 F (36.9 C) (01/26 [id number]) Pulse Rate:  [64-87] 87 (01/26 0633) Resp:  [18] 18 (01/25 1739) BP: (96-113)/(55-68) 112/61 mmHg (01/26 0633) SpO2:  [97 %-100 %] 98 % (01/26 [id number]) Weight change:   Intake/Output from previous day: 01/25 0701 - 01/26 0700 In: 500 [I.V.:500] Out: -  Intake/Output this shift:    General appearance: alert, cooperative and pale Resp: clear to auscultation bilaterally Cardio: S1, S2 normal and systolic murmur: holosystolic 2/6, blowing at apex GI: pos bs, liver down 4 cm, soft, TX RLQ Extremities: AVF RLA B&T  Lab Results:  Recent Labs  09/17/14 1421 09/18/14 0840  WBC 5.8 5.7  HGB 9.2* 8.6*  HCT 27.4* 25.8*  PLT 279 239   BMET:  Recent Labs  09/17/14 1421 09/18/14 0840  NA 133* 136  K 4.4 4.3  CL 105 108  CO2 14* 12*  GLUCOSE 88 93  BUN 105* 108*  CREATININE 17.64* 18.23*  CALCIUM 8.9 8.7   No results for input(s): PTH in the last 72 hours. Iron Studies: No results for input(s): IRON, TIBC, TRANSFERRIN, FERRITIN in the last 72 hours.  Studies/Results: US Renal  09/18/2014   CLINICAL DATA:  Evaluate transplanted kidney for obstruction  EXAM: RENAL/URINARY TRACT ULTRASOUND COMPLETE  COMPARISON:  None.  FINDINGS: Right Kidney:  Length: 5.4 cm. There is diffuse increased echotexture of the right kidney. No mass or hydronephrosis visualized.  Left Kidney:  Not definitely visualized.  Bladder:  The bladder is not distended, the patient voided recently limiting evaluation.  Right lower quadrant kidney transplant is identified. The transplant kidney measures 14.2 cm. The echotexture is normal. There is no hydronephrosis identified in the transplanted kidney.  IMPRESSION: Right lower quadrant kidney transplant measuring 14.2 cm. There is no hydronephrosis in the transplanted kidney.    Electronically Signed   By: Abelardo Diesel M.D.   On: 09/18/2014 08:37    I have reviewed the patient's current medications.  Assessment/Plan: 1 Renal Transplant with no function.  When this occurred is not clear. Will investigate any interim labs since 7/15.  U/S is normal.  Uremic, acidemic, at this time and needs HD.  Will do .  If no other hints, will discuss with [facility] to consider TX bx, would require transfer. Discussed with patient. 2 Anemia Fe pending, use max epo 3 HPTH checking 4 GERD P HD, epo, check on Labs    LOS: 1 day   Leontyne Manville L 09/18/2014,10:06 AM

## 2014-09-18 NOTE — Progress Notes (Signed)
Family Medicine Teaching Service Daily Progress Note Intern Pager: [phone]  Patient name: Candace Griffin Medical record number: [id number] Date of birth: [birth year] Age: 39 y.o. Gender: female  Primary Care Provider: Archie Patten, MD Consultants: Nephrology Code Status: Full  Pt Overview and Major Events to Date:  1/25: Admitted 1/26: Dialysis  Assessment and Plan: 39 y.o. female presenting with fatigue, nausea, vomiting, and increased thirst and noted to have Creatinine of 17.64. PMH is significant for ESRD s/p deceased donor kidney transplant 09/30/2011, HTN, and GERD.   # Acute Kidney Injury- Baseline Creatinine 1.5-3. Creatinine noted to be 17.64 and BUN 105 at admission.  - Urinalysis red and cloudy in appearance with 100 proteins and large hemoglobin - Mild anion gap acidosis with bicarb of 14 and AG of 14, likely related to uremia.  - Telemetry - F/U CellCept level, Tacrolimus level - Renal US: No hydronephrosis. RLQ kidney transplant 14.2cm. - Nephrology consulted. Appreciate recommendations. Await recs on further management.  - Dialysis today  - Wean off immunosuppressants as outpatient - Continue Cellcept, Prograf, and Prednisone - Zofran PRN nausea  # Anemia- Hemoglobin noted to be 9.2 at admission. - Hemoglobin 8.6 this morning. - Continue to monitor - Iron panel  # Hypertension- Home medications include Labetalol 300mg . Normotensive currently - Holding home medication. Consider restarting once appropriate.  FEN/GI: Renal Diet with 1276mL fluid restriction Prophylaxis: Subcutaneous Heparin  Disposition: Admitted to [place], [facility] attending. Discharge pending improvement of renal status.  Subjective:  No acute complaints overnight. Denies vomiting today. Continues to note some nausea, however improved. Complains of headache, but states she is trying to sleep to see if it improves. Complains of lower body pain with ambulation. No  further complaints today.  Objective: Temp:  [98 F (36.7 C)-98.5 F (36.9 C)] 98.5 F (36.9 C) (01/26 [id number]) Pulse Rate:  [64-87] 87 (01/26 0633) Resp:  [18] 18 (01/25 1739) BP: (96-113)/(55-68) 112/61 mmHg (01/26 0633) SpO2:  [97 %-100 %] 98 % (01/26 [id number]) Physical Exam: General: 39yo female resting comfortably in no apparent distress Cardiovascular: S1 and S2 noted. No murmurs/rubs/gallops. Regular rate and rhythm. Respiratory: Clear to auscultation bilaterally. No wheezes/rales/rhonchi. No increased work of breathing. Abdomen: Soft and nondistended. No tenderness to palpation. Extremities: No edema noted.  Laboratory:  Recent Labs Lab 09/17/14 1421  WBC 5.8  HGB 9.2*  HCT 27.4*  PLT 279    Recent Labs Lab 09/17/14 1421  NA 133*  K 4.4  CL 105  CO2 14*  BUN 105*  CREATININE 17.64*  CALCIUM 8.9  PROT 7.9  BILITOT 0.6  ALKPHOS 81  ALT 13  AST 17  GLUCOSE 88   Urinalysis    Component Value Date/Time   COLORURINE RED* 09/17/2014 1719   APPEARANCEUR CLOUDY* 09/17/2014 1719   LABSPEC 1.010 09/17/2014 1719   PHURINE 6.0 09/17/2014 1719   GLUCOSEU NEGATIVE 09/17/2014 1719   HGBUR LARGE* 09/17/2014 1719   HGBUR moderate 02/03/2010 1321   BILIRUBINUR NEGATIVE 09/17/2014 1719   BILIRUBINUR negative 05/31/2013 0920   KETONESUR NEGATIVE 09/17/2014 1719   PROTEINUR 100* 09/17/2014 1719   PROTEINUR 30 05/31/2013 0920   UROBILINOGEN 0.2 09/17/2014 1719   UROBILINOGEN 0.2 05/31/2013 0920   NITRITE NEGATIVE 09/17/2014 1719   NITRITE negative 05/31/2013 0920   LEUKOCYTESUR NEGATIVE 09/17/2014 1719  - Pregnancy test negative  Imaging/Diagnostic Tests: No results found.  [place], [place] 09/18/2014, 8:27 AM PGY-1, [place] Intern pager: [phone], text pages welcome

## 2014-09-18 NOTE — Progress Notes (Signed)
Candace Griffin is one of my primary care patients at [facility]. She states her N/V has improved some with medication and she's been able to keep food down. She has not yet had dialysis today. Understandably, she's upset about this hospitalization but she seems to be doing better with time to cope.  Thank you to the primary inpatient team and the nephrology team for your excellent work in caring for my patient.  Thanks, Kathrine Cords, MD

## 2014-09-18 NOTE — Progress Notes (Signed)
Patient ID: Candace Griffin, female   DOB: [birth year], 39 y.o.   MRN: [id number] Kidney is normal size on U/S, not small and scarred or big and swollen. Needs TX bx.  Discussed with Dr. Roxy Horseman at [facility] and they will take in transfer at end of the week.  Will dialyze daily to get less uremic.

## 2014-09-19 LAB — CBC
HCT: 27.4 % — ABNORMAL LOW (ref 36.0–46.0)
Hemoglobin: 9.4 g/dL — ABNORMAL LOW (ref 12.0–15.0)
MCH: 28.1 pg (ref 26.0–34.0)
MCHC: 34.3 g/dL (ref 30.0–36.0)
MCV: 81.8 fL (ref 78.0–100.0)
Platelets: 287 10*3/uL (ref 150–400)
RBC: 3.35 MIL/uL — ABNORMAL LOW (ref 3.87–5.11)
RDW: 12.8 % (ref 11.5–15.5)
WBC: 2.8 10*3/uL — ABNORMAL LOW (ref 4.0–10.5)

## 2014-09-19 LAB — RENAL FUNCTION PANEL
Albumin: 3.3 g/dL — ABNORMAL LOW (ref 3.5–5.2)
Anion gap: 17 — ABNORMAL HIGH (ref 5–15)
BUN: 87 mg/dL — ABNORMAL HIGH (ref 6–23)
CO2: 16 mmol/L — ABNORMAL LOW (ref 19–32)
Calcium: 8.9 mg/dL (ref 8.4–10.5)
Chloride: 102 mmol/L (ref 96–112)
Creatinine, Ser: 15.18 mg/dL — ABNORMAL HIGH (ref 0.50–1.10)
GFR calc Af Amer: 3 mL/min — ABNORMAL LOW (ref >90)
GFR calc non Af Amer: 3 mL/min — ABNORMAL LOW (ref >90)
Glucose, Bld: 140 mg/dL — ABNORMAL HIGH (ref 70–99)
Phosphorus: 8.6 mg/dL — ABNORMAL HIGH (ref 2.3–4.6)
Potassium: 3.9 mmol/L (ref 3.5–5.1)
Sodium: 135 mmol/L (ref 135–145)

## 2014-09-19 LAB — HEPATITIS B SURFACE ANTIBODY,QUALITATIVE: Hep B S Ab: REACTIVE — AB

## 2014-09-19 LAB — PTH, INTACT AND CALCIUM
Calcium, Total (PTH): 8.4 mg/dL — ABNORMAL LOW (ref 8.7–10.2)
PTH: 230 pg/mL — ABNORMAL HIGH (ref 15–65)

## 2014-09-19 LAB — TACROLIMUS LEVEL: Tacrolimus (FK506) - LabCorp: NOT DETECTED ng/mL (ref 2.0–20.0)

## 2014-09-19 LAB — HEPATITIS B CORE ANTIBODY, TOTAL: Hep B Core Total Ab: NEGATIVE — AB

## 2014-09-19 MED ORDER — SUCROFERRIC OXYHYDROXIDE 500 MG PO CHEW
500.0000 mg | CHEWABLE_TABLET | Freq: Three times a day (TID) | ORAL | Status: DC
  Administered 2014-09-19 – 2014-09-20 (×2): 500 mg via ORAL
  Filled 2014-09-19 (×6): qty 1

## 2014-09-19 MED ORDER — CALCITRIOL 0.25 MCG PO CAPS
0.2500 ug | ORAL_CAPSULE | ORAL | Status: DC
  Administered 2014-09-19: 0.25 ug via ORAL
  Filled 2014-09-19: qty 1

## 2014-09-19 MED ORDER — ONDANSETRON HCL 4 MG/2ML IJ SOLN
INTRAMUSCULAR | Status: AC
  Filled 2014-09-19: qty 2

## 2014-09-19 NOTE — Progress Notes (Signed)
Subjective: Interval History: has complaints , concern of length of stay in hosp.  Objective: Vital signs in last 24 hours: Temp:  [97.5 F (36.4 C)-98.6 F (37 C)] 97.5 F (36.4 C) (01/27 0500) Pulse Rate:  [62-85] 62 (01/27 0500) Resp:  [16-23] 18 (01/27 0500) BP: (100-136)/(56-82) 136/82 mmHg (01/27 0500) SpO2:  [97 %-100 %] 99 % (01/27 0500) Weight:  [65.1 kg (143 lb 8.3 oz)] 65.1 kg (143 lb 8.3 oz) (01/26 1659) Weight change:   Intake/Output from previous day: 01/26 0701 - 01/27 0700 In: 480 [P.O.:480] Out: 0  Intake/Output this shift:    General appearance: alert, cooperative and pale Resp: clear to auscultation bilaterally Cardio: S1, S2 normal, S4 present and systolic murmur: holosystolic 2/6, blowing at apex GI: pos bs, TX RLQ normal size and not tender Extremities: AVF RLA, B&T  Lab Results:  Recent Labs  09/17/14 1421 09/18/14 0840  WBC 5.8 5.7  HGB 9.2* 8.6*  HCT 27.4* 25.8*  PLT 279 239   BMET:  Recent Labs  09/18/14 0840 09/18/14 1428 09/18/14 1500  NA 136 136  --   K 4.3 4.0  --   CL 108 109  --   CO2 12* 11*  --   GLUCOSE 93 89  --   BUN 108* 107*  --   CREATININE 18.23* 17.88*  --   CALCIUM 8.7 8.2* 8.4*    Recent Labs  09/18/14 1500  PTH 230*  Comment   Iron Studies:  Recent Labs  09/18/14 1500  IRON 46  TIBC 187*  FERRITIN 193    Studies/Results: US Renal  09/18/2014   CLINICAL DATA:  Evaluate transplanted kidney for obstruction  EXAM: RENAL/URINARY TRACT ULTRASOUND COMPLETE  COMPARISON:  None.  FINDINGS: Right Kidney:  Length: 5.4 cm. There is diffuse increased echotexture of the right kidney. No mass or hydronephrosis visualized.  Left Kidney:  Not definitely visualized.  Bladder:  The bladder is not distended, the patient voided recently limiting evaluation.  Right lower quadrant kidney transplant is identified. The transplant kidney measures 14.2 cm. The echotexture is normal. There is no hydronephrosis identified in the  transplanted kidney.  IMPRESSION: Right lower quadrant kidney transplant measuring 14.2 cm. There is no hydronephrosis in the transplanted kidney.   Electronically Signed   By: Abelardo Diesel M.D.   On: 09/18/2014 08:37    I have reviewed the patient's current medications.  Assessment/Plan: 1 CKD5/Renal TX  Less uremic , had mild dysequilibrium yest .  2nd HD today, slow flows , shortened times.  Vol ok.  On Solumedrol and to go to [facility] on Fri for bx, to see if any reversibility. Will CLIP for outpatient HD as limited possibility of recovery. 2 Anemia Epo, Fe ok 3 HPTH add binder.  PTH pending 4 HTN  Not an issue P HD, binder, epo, check pth, anticipate bx    LOS: 2 days   Giamarie Bueche L 09/19/2014,9:38 AM

## 2014-09-19 NOTE — Progress Notes (Signed)
Family Medicine Teaching Service Daily Progress Note Intern Pager: [phone]  Patient name: Candace Griffin Medical record number: [id number] Date of birth: 09-Jul-1976 Age: 39 y.o. Gender: female  Primary Care Provider: Archie Patten, MD Consultants: Nephrology Code Status: Full  Pt Overview and Major Events to Date:  1/25: Admitted 1/26: Dialysis  Assessment and Plan: 39 y.o. female presenting with fatigue, nausea, vomiting, and increased thirst and noted to have Creatinine of 17.64. PMH is significant for ESRD s/p deceased donor kidney transplant 09/30/2011, HTN, and GERD.   # Acute Kidney Injury- Baseline Creatinine 1.5-3. Creatinine noted to be 17.64 and BUN 105 at admission.  - Creatinine 17.88, down from 18.23 prior to dialysis - Telemetry - F/U CellCept level, Tacrolimus level - Renal US: No hydronephrosis. RLQ kidney transplant 14.2cm. - Nephrology consulted. Appreciate recommendations. Await recs on further management.  - Dialysis daily x3 days. Transfer to [facility] for biopsy at end of week  - Wean off immunosuppressants as outpatient - Continue Cellcept, Prograf, and Prednisone - Zofran PRN nausea  # Anemia- Hemoglobin noted to be 9.2 at admission. - Hemoglobin 8.6  - Continue to monitor - Iron panel  # Hypertension- Home medications include Labetalol 300mg . Normotensive currently - Holding home medication. Consider restarting once appropriate.  FEN/GI: Renal Diet with 1279mL fluid restriction Prophylaxis: Subcutaneous Heparin  Disposition: Admitted to [place], [facility] attending. Discharge pending improvement of renal status.  Subjective:  No acute complaints overnight. Headache resolved. Vomited once this morning, however Zofran helps. Denies abdominal pain. Still fatigued. No further concerns today.  Objective: Temp:  [97.5 F (36.4 C)-98.6 F (37 C)] 97.5 F (36.4 C) (01/27 0500) Pulse Rate:  [62-85] 62 (01/27 0500) Resp:  [16-23] 18  (01/27 0500) BP: (100-136)/(56-82) 136/82 mmHg (01/27 0500) SpO2:  [97 %-100 %] 99 % (01/27 0500) Weight:  [143 lb 8.3 oz (65.1 kg)] 143 lb 8.3 oz (65.1 kg) (01/26 1659) Physical Exam: General: 39yo female resting comfortably in no apparent distress Cardiovascular: S1 and S2 noted. No murmurs/rubs/gallops. Regular rate and rhythm. Respiratory: Clear to auscultation bilaterally. No wheezes/rales/rhonchi. No increased work of breathing. Abdomen: Soft and nondistended. No tenderness to palpation. Extremities: No edema noted.  Laboratory:  Recent Labs Lab 09/17/14 1421 09/18/14 0840  WBC 5.8 5.7  HGB 9.2* 8.6*  HCT 27.4* 25.8*  PLT 279 239    Recent Labs Lab 09/17/14 1421 09/18/14 0840 09/18/14 1428  NA 133* 136 136  K 4.4 4.3 4.0  CL 105 108 109  CO2 14* 12* 11*  BUN 105* 108* 107*  CREATININE 17.64* 18.23* 17.88*  CALCIUM 8.9 8.7 8.2*  PROT 7.9  --   --   BILITOT 0.6  --   --   ALKPHOS 81  --   --   ALT 13  --   --   AST 17  --   --   GLUCOSE 88 93 89   Urinalysis    Component Value Date/Time   COLORURINE RED* 09/17/2014 1719   APPEARANCEUR CLOUDY* 09/17/2014 1719   LABSPEC 1.010 09/17/2014 1719   PHURINE 6.0 09/17/2014 1719   GLUCOSEU NEGATIVE 09/17/2014 1719   HGBUR LARGE* 09/17/2014 1719   HGBUR moderate 02/03/2010 1321   BILIRUBINUR NEGATIVE 09/17/2014 1719   BILIRUBINUR negative 05/31/2013 0920   KETONESUR NEGATIVE 09/17/2014 1719   PROTEINUR 100* 09/17/2014 1719   PROTEINUR 30 05/31/2013 0920   UROBILINOGEN 0.2 09/17/2014 1719   UROBILINOGEN 0.2 05/31/2013 0920   NITRITE NEGATIVE 09/17/2014 1719  NITRITE negative 05/31/2013 0920   LEUKOCYTESUR NEGATIVE 09/17/2014 1719  - Pregnancy test negative  Imaging/Diagnostic Tests: US Renal  09/18/2014   CLINICAL DATA:  Evaluate transplanted kidney for obstruction  EXAM: RENAL/URINARY TRACT ULTRASOUND COMPLETE  COMPARISON:  None.  FINDINGS: Right Kidney:  Length: 5.4 cm. There is diffuse increased  echotexture of the right kidney. No mass or hydronephrosis visualized.  Left Kidney:  Not definitely visualized.  Bladder:  The bladder is not distended, the patient voided recently limiting evaluation.  Right lower quadrant kidney transplant is identified. The transplant kidney measures 14.2 cm. The echotexture is normal. There is no hydronephrosis identified in the transplanted kidney.  IMPRESSION: Right lower quadrant kidney transplant measuring 14.2 cm. There is no hydronephrosis in the transplanted kidney.   Electronically Signed   By: Abelardo Diesel M.D.   On: 09/18/2014 08:37    Lorna Few, DO 09/19/2014, 8:06 AM PGY-1, [place] Intern pager: [phone], text pages welcome

## 2014-09-19 NOTE — Procedures (Signed)
I was present at this session.  I have reviewed the session itself and made appropriate changes. Cont low flow.  tol better today, no N, V.  Bp and access press ok.    Tresten Pantoja L 1/27/20163:21 PM

## 2014-09-19 NOTE — Discharge Summary (Signed)
[facility] Discharge Summary  Patient name: Candace Griffin Medical record number: [id number] Date of birth: [birth year] Age: 39 y.o. Gender: female Date of Admission: 09/17/2014  Date of Discharge: 09/20/2014  Admitting Physician: Lupita Dawn, MD  Primary Care Provider: Archie Patten, MD Consultants: Nephrology  Indication for Hospitalization: AKI  Discharge Diagnoses/Problem List:  AKI s/p renal transplant Anemia of chronic renal disease Hypertension  Disposition: Transfer to [facility]  Discharge Condition: Stable  Discharge Exam: see progress note from day of discharge  Brief Hospital Course:  Candace Griffin is a 39yo female who presented on 09/17/2014 with fatigue, nausea, and vomiting. History of renal transplant secondary to glomerulonephritis in 2013. Creatinine was noted to be 17.64 at admission with GFR of 3. Potassium was normal at 4.4. Urinalysis was abnormal with 100 proteins and large hemoglobin. Mild anion gap metabolic acidosis was noted at admission with bicarbonate of 14 and anion gap of 14.  Nephrology was consulted. CellCept, Tacrolimus and Prednisone were continued throughout hospitalization. Renal US showed no hydronephrosis and transplanted kidney in RLQ 14.2cm. Dialysis was re-initiated using patent AV fistula. Candace Griffin was dialyzed on 1/26, 1/27, 1/28. Questionable etiology of nonfunctioning kidneys, but rejection is in differential. [facility]phrologists at [facility] communicated with nephrologists at [facility] and it was agreed to transfer for renal biopsy. Was also given high dose solumedrol to cover for any degree of acute rejection. Creatinine improved to 15.18 prior to transfer.  Hemoglobin was noted to be low at 9.2 at admission. Suspected to be secondary to anemia of chronic disease. Iron normal.   Hypotensive/Normotensive at admission. Labetalol was held throughout admission.  Issues for Follow Up:  1. Follow Up  Creatinine 2. Follow up with Nephrology 3. Consider weaning off immunosuppressants as outpatient 4. Continue dialysis as outpatient  Significant Procedures: Hemodialysis  Significant Labs and Imaging:   Recent Labs Lab 09/17/14 1421 09/18/14 0840 09/19/14 1345  WBC 5.8 5.7 2.8*  HGB 9.2* 8.6* 9.4*  HCT 27.4* 25.8* 27.4*  PLT 279 239 287    Recent Labs Lab 09/17/14 1421 09/18/14 0840 09/18/14 1428 09/18/14 1500 09/19/14 1344  NA 133* 136 136  --  135  K 4.4 4.3 4.0  --  3.9  CL 105 108 109  --  102  CO2 14* 12* 11*  --  16*  GLUCOSE 88 93 89  --  140*  BUN 105* 108* 107*  --  87*  CREATININE 17.64* 18.23* 17.88*  --  15.18*  CALCIUM 8.9 8.7 8.2* 8.4* 8.9  PHOS  --   --  10.3*  --  8.6*  ALKPHOS 81  --   --   --   --   AST 17  --   --   --   --   ALT 13  --   --   --   --   ALBUMIN 3.4*  --  2.9*  --  3.3*  Iron/TIBC/Ferritin/ %Sat    Component Value Date/Time   IRON 46 09/18/2014 1500   TIBC 187* 09/18/2014 1500   FERRITIN 193 09/18/2014 1500   IRONPCTSAT 25 09/18/2014 1500  Urinalysis    Component Value Date/Time   COLORURINE RED* 09/17/2014 1719   APPEARANCEUR CLOUDY* 09/17/2014 1719   LABSPEC 1.010 09/17/2014 1719   PHURINE 6.0 09/17/2014 1719   GLUCOSEU NEGATIVE 09/17/2014 1719   HGBUR LARGE* 09/17/2014 1719   HGBUR moderate 02/03/2010 1321   BILIRUBINUR NEGATIVE 09/17/2014 1719   BILIRUBINUR negative  05/31/2013 0920   KETONESUR NEGATIVE 09/17/2014 1719   PROTEINUR 100* 09/17/2014 1719   PROTEINUR 30 05/31/2013 0920   UROBILINOGEN 0.2 09/17/2014 1719   UROBILINOGEN 0.2 05/31/2013 0920   NITRITE NEGATIVE 09/17/2014 1719   NITRITE negative 05/31/2013 0920   LEUKOCYTESUR NEGATIVE 09/17/2014 1719    - Pregnancy negative - PTH 230   US Renal  09/18/2014   CLINICAL DATA:  Evaluate transplanted kidney for obstruction  EXAM: RENAL/URINARY TRACT ULTRASOUND COMPLETE  COMPARISON:  None.  FINDINGS: Right Kidney:  Length: 5.4 cm. There is diffuse  increased echotexture of the right kidney. No mass or hydronephrosis visualized.  Left Kidney:  Not definitely visualized.  Bladder:  The bladder is not distended, the patient voided recently limiting evaluation.  Right lower quadrant kidney transplant is identified. The transplant kidney measures 14.2 cm. The echotexture is normal. There is no hydronephrosis identified in the transplanted kidney.  IMPRESSION: Right lower quadrant kidney transplant measuring 14.2 cm. There is no hydronephrosis in the transplanted kidney.   Electronically Signed   By: Abelardo Diesel M.D.   On: 09/18/2014 08:37   Results/Tests Pending at Time of Discharge: none  Discharge Medications:    Medication List    STOP taking these medications        labetalol 300 MG tablet  Commonly known as:  NORMODYNE     NYQUIL PO      TAKE these medications        calcitRIOL 0.25 MCG capsule  Commonly known as:  ROCALTROL  Take 1 capsule (0.25 mcg total) by mouth every other day.     Darbepoetin Alfa 200 MCG/0.4ML Sosy injection  Commonly known as:  ARANESP  Inject 0.4 mLs (200 mcg total) into the vein every Tuesday with hemodialysis.     methylPREDNISolone sodium succinate 500 mg in sodium chloride 0.9 % 50 mL  Inject 500 mg into the vein daily.     multivitamin Tabs tablet  Take 1 tablet by mouth at bedtime.     mycophenolate 180 MG EC tablet  Commonly known as:  MYFORTIC  Take 360 mg by mouth 2 (two) times daily.     omeprazole 20 MG capsule  Commonly known as:  PRILOSEC  Take 20 mg by mouth daily.     ondansetron 8 MG disintegrating tablet  Commonly known as:  ZOFRAN ODT  Take 1 tablet (8 mg total) by mouth every 8 (eight) hours as needed for nausea.     predniSONE 5 MG tablet  Commonly known as:  DELTASONE  Take 10 mg by mouth daily.     sucroferric oxyhydroxide 500 MG chewable tablet  Commonly known as:  VELPHORO  Chew 1 tablet (500 mg total) by mouth 3 (three) times daily with meals.      sulfamethoxazole-trimethoprim 400-80 MG per tablet  Commonly known as:  BACTRIM,SEPTRA  Take 1 tablet by mouth See admin instructions. Monday Wednesday Friday     tacrolimus 1 MG capsule  Commonly known as:  PROGRAF  Take 1 mg by mouth 2 (two) times daily. Total of 1.5mg  BID     tacrolimus 0.5 MG capsule  Commonly known as:  PROGRAF  Take 0.5 mg by mouth 2 (two) times daily. Total of 1.5mg  BID     traMADol 50 MG tablet  Commonly known as:  ULTRAM  Take 1 tablet (50 mg total) by mouth every 6 (six) hours as needed.        Discharge Instructions: Please refer to Patient  Instructions section of EMR for full details.  Patient was counseled important signs and symptoms that should prompt return to medical care, changes in medications, dietary instructions, activity restrictions, and follow up appointments.   Follow-Up Appointments:   Lavon Paganini, MD 09/20/2014, 1:06 PM PGY-1, [place] [facility]

## 2014-09-19 NOTE — Progress Notes (Signed)
IV infiltrated left hand, paged IV team consult for restart.  Right arm restricted.

## 2014-09-20 DIAGNOSIS — Z992 Dependence on renal dialysis: Secondary | ICD-10-CM | POA: Diagnosis not present

## 2014-09-20 DIAGNOSIS — I120 Hypertensive chronic kidney disease with stage 5 chronic kidney disease or end stage renal disease: Secondary | ICD-10-CM | POA: Diagnosis present

## 2014-09-20 DIAGNOSIS — Z940 Kidney transplant status: Secondary | ICD-10-CM | POA: Diagnosis not present

## 2014-09-20 DIAGNOSIS — Z5181 Encounter for therapeutic drug level monitoring: Secondary | ICD-10-CM | POA: Diagnosis not present

## 2014-09-20 DIAGNOSIS — N179 Acute kidney failure, unspecified: Secondary | ICD-10-CM | POA: Diagnosis not present

## 2014-09-20 DIAGNOSIS — D631 Anemia in chronic kidney disease: Secondary | ICD-10-CM | POA: Diagnosis present

## 2014-09-20 DIAGNOSIS — Z4822 Encounter for aftercare following kidney transplant: Secondary | ICD-10-CM | POA: Diagnosis not present

## 2014-09-20 DIAGNOSIS — R0600 Dyspnea, unspecified: Secondary | ICD-10-CM | POA: Diagnosis not present

## 2014-09-20 DIAGNOSIS — N186 End stage renal disease: Secondary | ICD-10-CM | POA: Diagnosis present

## 2014-09-20 DIAGNOSIS — T8611 Kidney transplant rejection: Secondary | ICD-10-CM | POA: Diagnosis not present

## 2014-09-20 DIAGNOSIS — Z79899 Other long term (current) drug therapy: Secondary | ICD-10-CM | POA: Diagnosis not present

## 2014-09-20 DIAGNOSIS — T8612 Kidney transplant failure: Secondary | ICD-10-CM | POA: Diagnosis present

## 2014-09-20 LAB — RENAL FUNCTION PANEL
Albumin: 3.4 g/dL — ABNORMAL LOW (ref 3.5–5.2)
Anion gap: 16 — ABNORMAL HIGH (ref 5–15)
BUN: 66 mg/dL — ABNORMAL HIGH (ref 6–23)
CO2: 21 mmol/L (ref 19–32)
Calcium: 8.6 mg/dL (ref 8.4–10.5)
Chloride: 98 mmol/L (ref 96–112)
Creatinine, Ser: 11.73 mg/dL — ABNORMAL HIGH (ref 0.50–1.10)
GFR calc Af Amer: 4 mL/min — ABNORMAL LOW (ref >90)
GFR calc non Af Amer: 4 mL/min — ABNORMAL LOW (ref >90)
Glucose, Bld: 171 mg/dL — ABNORMAL HIGH (ref 70–99)
Phosphorus: 6.3 mg/dL — ABNORMAL HIGH (ref 2.3–4.6)
Potassium: 3.6 mmol/L (ref 3.5–5.1)
Sodium: 135 mmol/L (ref 135–145)

## 2014-09-20 LAB — MYCOPHENOLIC ACID (CELLCEPT)
MPA Glucuronide: 10.2 ug/mL — ABNORMAL LOW (ref 35.0–100.0)
MPA: <0.5 ug/mL — CL (ref 1.0–3.5)

## 2014-09-20 MED ORDER — SODIUM CHLORIDE 0.9 % IV SOLN: 500.0000 mg | INTRAVENOUS | Status: DC

## 2014-09-20 MED ORDER — CALCITRIOL 0.25 MCG PO CAPS: 0.2500 ug | ORAL_CAPSULE | ORAL | Status: DC

## 2014-09-20 MED ORDER — RENA-VITE PO TABS: 1.0000 | ORAL_TABLET | Freq: Every day | ORAL | Status: DC

## 2014-09-20 MED ORDER — SUCROFERRIC OXYHYDROXIDE 500 MG PO CHEW: 500.0000 mg | CHEWABLE_TABLET | Freq: Three times a day (TID) | ORAL | Status: DC

## 2014-09-20 MED ORDER — DARBEPOETIN ALFA 200 MCG/0.4ML IJ SOSY: 200.0000 ug | PREFILLED_SYRINGE | INTRAMUSCULAR | Status: DC

## 2014-09-20 NOTE — Progress Notes (Signed)
Subjective: Interval History: none, just tired.  Objective: Vital signs in last 24 hours: Temp:  [97.7 F (36.5 C)-98.7 F (37.1 C)] 97.8 F (36.6 C) (01/28 0549) Pulse Rate:  [66-88] 70 (01/28 0549) Resp:  [14-20] 16 (01/28 0549) BP: (105-142)/(70-92) 142/92 mmHg (01/28 0549) SpO2:  [98 %-100 %] 98 % (01/28 0549) Weight:  [63.2 kg (139 lb 5.3 oz)-64.3 kg (141 lb 12.1 oz)] 63.2 kg (139 lb 5.3 oz) (01/27 1647) Weight change: -0.8 kg (-1 lb 12.2 oz)  Intake/Output from previous day: 01/27 0701 - 01/28 0700 In: 360 [P.O.:360] Out: 1294  Intake/Output this shift:    General appearance: cooperative and pale Resp: clear to auscultation bilaterally Cardio: S1, S2 normal and systolic murmur: holosystolic 2/6, blowing at apex GI: pos bs,liver down 4 cm, tx RLQ nontender Extremities: AVF R FA  Lab Results:  Recent Labs  09/18/14 0840 09/19/14 1345  WBC 5.7 2.8*  HGB 8.6* 9.4*  HCT 25.8* 27.4*  PLT 239 287   BMET:  Recent Labs  09/18/14 1428 09/18/14 1500 09/19/14 1344  NA 136  --  135  K 4.0  --  3.9  CL 109  --  102  CO2 11*  --  16*  GLUCOSE 89  --  140*  BUN 107*  --  87*  CREATININE 17.88*  --  15.18*  CALCIUM 8.2* 8.4* 8.9    Recent Labs  09/18/14 1500  PTH 230*  Comment   Iron Studies:  Recent Labs  09/18/14 1500  IRON 46  TIBC 187*  FERRITIN 193    Studies/Results: No results found.  I have reviewed the patient's current medications.  Assessment/Plan: 1 Renal TX/?AKI/CKD  For 3rd HD today.  Vol and bp ok.  Awaiting tx to [place], probably tomorrow for bx.  CLIP in progress. 2 Anemia stable 3 HPTH vit D 4 HTN off meds 5 GERD P HD, CLIP, Transfer for [place] bx when arranged.    LOS: 3 days   Candace Griffin L 09/20/2014,9:31 AM

## 2014-09-20 NOTE — Procedures (Signed)
I was present at this session.  I have reviewed the session itself and made appropriate changes.  HD via R arm avf.  Starting HD  Kalila Adkison L 1/28/201611:30 AM

## 2014-09-20 NOTE — Progress Notes (Signed)
CRITICAL VALUE ALERT  Critical value received:  mycophenolic acid = [id number]   (reference range 1.0-3.5 mcg/mL)  Date of notification:  09-20-2014  Time of notification:  10:24  Critical value read back:Yes.    Nurse who received alert:  Genelle Bal, RN  MD notified (1st page):  [place]eter[place]  Time of first page:  10:29  MD notified (2nd page):  Time of second page:  Responding MD:  Deterding  Time MD responded:  10:35

## 2014-09-20 NOTE — Discharge Instructions (Signed)
You were admitted for kidney failure.  We are transferring you to [facility] for further evaluation by the transplant team.   Dialysis Dialysis is a procedure that replaces some of the work healthy kidneys do. It is done when you lose about 85-90% of your kidney function. It may also be done earlier if your symptoms may be improved by dialysis. During dialysis, wastes, salt, and extra water are removed from the blood, and the levels of certain chemicals in the blood (such as potassium) are maintained. Dialysis is done in sessions. Dialysis sessions are continued until the kidneys get better. If the kidneys cannot get better, such as in end-stage kidney disease, dialysis is continued for life or until you receive a new kidney (kidney transplant). There are two types of dialysis: hemodialysis and peritoneal dialysis. WHAT IS HEMODIALYSIS?  Hemodialysis is a type of dialysis in which a machine called a dialyzer is used to filter the blood. Before beginning hemodialysis, you will have surgery to create a site where blood can be removed from the body and returned to the body (vascular access). There are three types of vascular accesses:  Arteriovenous fistula. To create this type of access, an artery is connected to a vein (usually in the arm). A fistula takes 1-6 months to develop after surgery. If it develops properly, it usually lasts longer than the other types of vascular accesses. It is also less likely to become infected and cause blood clots.  Arteriovenous graft. To create this type of access, an artery and a vein in the arm are connected with a tube. A graft may be used within 2-3 weeks of surgery.  A venous catheter. To create this type of access, a thin, flexible tube (catheter) is placed in a large vein in your neck, chest, or groin. A catheter may be used right away. It is usually used as a temporary access when dialysis needs to begin immediately. During hemodialysis, blood leaves the body  through your access. It travels through a tube to the dialyzer, where it is filtered. The blood then returns to your body through another tube. Hemodialysis is usually performed by a health care provider at a hospital or dialysis center three times a week. Visits last about 3-4 hours. It may also be performed with the help of another person at home with training.  WHAT IS PERITONEAL DIALYSIS? Peritoneal dialysis is a type of dialysis in which the thin lining of the abdomen (peritoneum) is used as a filter. Before beginning peritoneal dialysis, you will have surgery to place a catheter in your abdomen. The catheter will be used to transfer a fluid called dialysate to and from your abdomen. At the start of a session, your abdomen is filled with dialysate. During the session, wastes, salt, and extra water in the blood pass through the peritoneum and into the dialysate. The dialysate is drained from the body at the end of the session. The process of filling and draining the dialysate is called an exchange. Exchanges are repeated until you have used up all the dialysate for the day. Peritoneal dialysis may be performed by you at home or at almost any other location. It is done every day. You may need up to five exchanges a day. The amount of time the dialysate is in your body between exchanges is called a dwell. The dwell depends on the number of exchanges needed and the characteristics of the peritoneum. It usually varies from 1.5-3 hours. You may go about your  day normally between exchanges. Alternately, the exchanges may be done at night while you sleep, using a machine called a cycler. WHICH TYPE OF DIALYSIS SHOULD I CHOOSE?  Both hemodialysis and peritoneal dialysis have advantages and disadvantages. Talk to your health care provider about which type of dialysis would be best for you. Your lifestyle and preferences should be considered along with your medical condition. In some cases, only one type of dialysis  may be an option.  Advantages of hemodialysis  It is done less often than peritoneal dialysis.  Someone else can do the dialysis for you.  If you go to a dialysis center, your health care provider will be able to recognize any problems right away.  If you go to a dialysis center, you can interact with others who are having dialysis. This can provide you with emotional support. Disadvantages of hemodialysis  Hemodialysis may cause cramps and low blood pressure. It may leave you feeling tired on the days you have the treatment.  If you go to a dialysis center, you will need to make weekly appointments and work around the center's schedule.  You will need to take extra care when traveling. If you go to a dialysis center, you will need to make special arrangements to visit a dialysis center near your destination. If you are having treatments at home, you will need to take the dialyzer with you to your destination.  You will need to avoid more foods than you would need to avoid on peritoneal dialysis. Advantages of peritoneal dialysis  It is less likely than hemodialysis to cause cramps and low blood pressure.  You may do exchanges on your own wherever you are, including when you travel.  You do not need to avoid as many foods as you do on hemodialysis. Disadvantages of peritoneal dialysis  It is done more often than hemodialysis.  Performing peritoneal dialysis requires you to have dexterity of the hands. You must also be able to lift bags.  You will have to learn sterilization techniques. You will need to practice them every day to reduce the risk of infection. WHAT CHANGES WILL I NEED TO MAKE TO MY DIET DURING DIALYSIS? Both hemodialysis and peritoneal dialysis require you to make some changes to your diet. For example, you will need to limit your intake of foods high in the minerals phosphorus and potassium. You will also need to limit your fluid intake. Your dietitian can help you  plan meals. A good meal plan can improve your dialysis and your health.  WHAT SHOULD I EXPECT WHEN BEGINNING DIALYSIS? Adjusting to the dialysis treatment, schedule, and diet can take some time. You may need to stop working and may not be able to do some of the things you normally do. You may feel anxious or depressed when beginning dialysis. Eventually, many people feel better overall because of dialysis. Some people are able to return to work after making some changes, such as reducing work intensity. WHERE CAN I FIND MORE INFORMATION?   [place]: www.kidney.org  American Association of Kidney Patients: BombTimer.gl  [employer]: www.kidneyfund.org Document Released: 10/31/2002 Document Revised: 12/25/2013 Document Reviewed: 10/04/2012 [facility] Patient Information 2015 [place], [place]. This information is not intended to replace advice given to you by your health care provider. Make sure you discuss any questions you have with your health care provider.

## 2014-09-20 NOTE — Progress Notes (Signed)
Family Medicine Teaching Service Daily Progress Note Intern Pager: [phone]  Patient name: Candace Griffin Medical record number: [id number] Date of birth: 1976/06/02 Age: 39 y.o. Gender: female  Primary Care Provider: Archie Patten, MD Consultants: Nephrology Code Status: Full  Pt Overview and Major Events to Date:  1/25: Admitted 1/26: Dialysis  Assessment and Plan: 39 y.o. female presenting with fatigue, nausea, vomiting, and increased thirst and noted to have Creatinine of 17.64. PMH is significant for ESRD s/p deceased donor kidney transplant 09/30/2011, HTN, and GERD.   # Acute Kidney Injury- Baseline Creatinine 1.5-3. Creatinine noted to be 17.64 and BUN 105 at admission.  - Creatinine 15.18 - Telemetry - Renal US: No hydronephrosis. RLQ kidney transplant 14.2cm. - Nephrology consulted. Appreciate recommendations.   - Dialysis daily x3 days. Transfer to [facility] for biopsy at end of week  - Wean off immunosuppressants as outpatient  - Continue high dose solumedrol - Continue Cellcept, Prograf, and Prednisone - Zofran PRN nausea  # Anemia- Likely of chronic renal disease. Hemoglobin noted to be 9.2 at admission. - Hemoglobin 9.4  - Continue to monitor  # Hypertension- Home medications include Labetalol 300mg . Normotensive currently - Holding home medication. Consider restarting once appropriate.  FEN/GI: Renal Diet with 128mL fluid restriction Prophylaxis: Subcutaneous Heparin  Disposition: Transfer to [facility] today  Subjective:  No acute complaints overnight. Feeling the same as previously. Awaitign transfer to [facility].  Objective: Temp:  [97.7 F (36.5 C)-98.7 F (37.1 C)] 97.8 F (36.6 C) (01/28 0549) Pulse Rate:  [66-88] 70 (01/28 0549) Resp:  [14-20] 16 (01/28 0549) BP: (105-142)/(70-92) 142/92 mmHg (01/28 0549) SpO2:  [98 %-100 %] 98 % (01/28 0549) Weight:  [139 lb 5.3 oz (63.2 kg)-141 lb 12.1 oz (64.3 kg)] 139 lb 5.3 oz (63.2 kg) (01/27 1647) Physical  Exam: General: 39yo female resting comfortably in no apparent distress Cardiovascular: S1 and S2 noted. No murmurs/rubs/gallops. Regular rate and rhythm. Respiratory: Clear to auscultation bilaterally. No wheezes/rales/rhonchi. No increased work of breathing. Abdomen: Soft and nondistended. No tenderness to palpation. Extremities: No edema noted.  Laboratory:  Recent Labs Lab 09/17/14 1421 09/18/14 0840 09/19/14 1345  WBC 5.8 5.7 2.8*  HGB 9.2* 8.6* 9.4*  HCT 27.4* 25.8* 27.4*  PLT 279 239 287    Recent Labs Lab 09/17/14 1421 09/18/14 0840 09/18/14 1428 09/18/14 1500 09/19/14 1344  NA 133* 136 136  --  135  K 4.4 4.3 4.0  --  3.9  CL 105 108 109  --  102  CO2 14* 12* 11*  --  16*  BUN 105* 108* 107*  --  87*  CREATININE 17.64* 18.23* 17.88*  --  15.18*  CALCIUM 8.9 8.7 8.2* 8.4* 8.9  PROT 7.9  --   --   --   --   BILITOT 0.6  --   --   --   --   ALKPHOS 81  --   --   --   --   ALT 13  --   --   --   --   AST 17  --   --   --   --   GLUCOSE 88 93 89  --  140*   Urinalysis    Component Value Date/Time   COLORURINE RED* 09/17/2014 1719   APPEARANCEUR CLOUDY* 09/17/2014 1719   LABSPEC 1.010 09/17/2014 1719   PHURINE 6.0 09/17/2014 1719   GLUCOSEU NEGATIVE 09/17/2014 1719   HGBUR LARGE* 09/17/2014 1719   HGBUR moderate 02/03/2010 1321  BILIRUBINUR NEGATIVE 09/17/2014 1719   BILIRUBINUR negative 05/31/2013 0920   KETONESUR NEGATIVE 09/17/2014 1719   PROTEINUR 100* 09/17/2014 1719   PROTEINUR 30 05/31/2013 0920   UROBILINOGEN 0.2 09/17/2014 1719   UROBILINOGEN 0.2 05/31/2013 0920   NITRITE NEGATIVE 09/17/2014 1719   NITRITE negative 05/31/2013 0920   LEUKOCYTESUR NEGATIVE 09/17/2014 1719  - Pregnancy test negative  Imaging/Diagnostic Tests: No results found.  Lavon Paganini, MD 09/20/2014, 9:00 AM PGY-1, [facility] Intern pager: [phone], text pages welcome

## 2014-09-20 NOTE — Progress Notes (Signed)
Pt prepared for d/c to [facility]. IV left in place. Skin intact except as most recently charted. Vitals are stable. Report called to Lorriane Shire at receiving facility (desk phone, [phone]). Pt to be transported by [facility] ambulance service.  Jillyn Ledger, MBA, BS, RN

## 2014-09-26 DIAGNOSIS — N186 End stage renal disease: Secondary | ICD-10-CM | POA: Diagnosis not present

## 2014-09-26 DIAGNOSIS — D509 Iron deficiency anemia, unspecified: Secondary | ICD-10-CM | POA: Diagnosis not present

## 2014-09-26 DIAGNOSIS — D631 Anemia in chronic kidney disease: Secondary | ICD-10-CM | POA: Diagnosis not present

## 2014-09-26 DIAGNOSIS — N2581 Secondary hyperparathyroidism of renal origin: Secondary | ICD-10-CM | POA: Diagnosis not present

## 2014-09-27 DIAGNOSIS — T8611 Kidney transplant rejection: Secondary | ICD-10-CM | POA: Diagnosis not present

## 2014-09-27 DIAGNOSIS — Z5181 Encounter for therapeutic drug level monitoring: Secondary | ICD-10-CM | POA: Diagnosis not present

## 2014-09-27 DIAGNOSIS — I10 Essential (primary) hypertension: Secondary | ICD-10-CM | POA: Diagnosis not present

## 2014-09-27 DIAGNOSIS — Z4822 Encounter for aftercare following kidney transplant: Secondary | ICD-10-CM | POA: Diagnosis not present

## 2014-09-27 DIAGNOSIS — D631 Anemia in chronic kidney disease: Secondary | ICD-10-CM | POA: Diagnosis not present

## 2014-09-27 DIAGNOSIS — Z940 Kidney transplant status: Secondary | ICD-10-CM | POA: Diagnosis not present

## 2014-09-27 DIAGNOSIS — Z79899 Other long term (current) drug therapy: Secondary | ICD-10-CM | POA: Diagnosis not present

## 2014-09-28 DIAGNOSIS — Z940 Kidney transplant status: Secondary | ICD-10-CM | POA: Diagnosis not present

## 2014-09-28 DIAGNOSIS — Z79899 Other long term (current) drug therapy: Secondary | ICD-10-CM | POA: Diagnosis not present

## 2014-09-28 DIAGNOSIS — Z4822 Encounter for aftercare following kidney transplant: Secondary | ICD-10-CM | POA: Diagnosis not present

## 2014-09-28 DIAGNOSIS — I10 Essential (primary) hypertension: Secondary | ICD-10-CM | POA: Diagnosis not present

## 2014-09-28 DIAGNOSIS — D631 Anemia in chronic kidney disease: Secondary | ICD-10-CM | POA: Diagnosis not present

## 2014-09-28 DIAGNOSIS — N179 Acute kidney failure, unspecified: Secondary | ICD-10-CM | POA: Diagnosis not present

## 2014-09-28 DIAGNOSIS — T8611 Kidney transplant rejection: Secondary | ICD-10-CM | POA: Diagnosis not present

## 2014-09-28 DIAGNOSIS — Z5181 Encounter for therapeutic drug level monitoring: Secondary | ICD-10-CM | POA: Diagnosis not present

## 2014-09-28 DIAGNOSIS — Z992 Dependence on renal dialysis: Secondary | ICD-10-CM | POA: Diagnosis not present

## 2014-09-29 DIAGNOSIS — Z79899 Other long term (current) drug therapy: Secondary | ICD-10-CM | POA: Diagnosis not present

## 2014-09-29 DIAGNOSIS — Z940 Kidney transplant status: Secondary | ICD-10-CM | POA: Diagnosis not present

## 2014-09-29 DIAGNOSIS — Z5181 Encounter for therapeutic drug level monitoring: Secondary | ICD-10-CM | POA: Diagnosis not present

## 2014-09-29 DIAGNOSIS — T8611 Kidney transplant rejection: Secondary | ICD-10-CM | POA: Diagnosis not present

## 2014-09-29 DIAGNOSIS — N179 Acute kidney failure, unspecified: Secondary | ICD-10-CM | POA: Diagnosis not present

## 2014-09-29 DIAGNOSIS — D631 Anemia in chronic kidney disease: Secondary | ICD-10-CM | POA: Diagnosis not present

## 2014-09-29 DIAGNOSIS — Z4822 Encounter for aftercare following kidney transplant: Secondary | ICD-10-CM | POA: Diagnosis not present

## 2014-09-29 DIAGNOSIS — I10 Essential (primary) hypertension: Secondary | ICD-10-CM | POA: Diagnosis not present

## 2014-10-01 DIAGNOSIS — T8611 Kidney transplant rejection: Secondary | ICD-10-CM | POA: Diagnosis not present

## 2014-10-01 DIAGNOSIS — K611 Rectal abscess: Secondary | ICD-10-CM | POA: Diagnosis not present

## 2014-10-01 DIAGNOSIS — Z940 Kidney transplant status: Secondary | ICD-10-CM | POA: Diagnosis not present

## 2014-10-01 DIAGNOSIS — Z5181 Encounter for therapeutic drug level monitoring: Secondary | ICD-10-CM | POA: Diagnosis not present

## 2014-10-02 DIAGNOSIS — Z79899 Other long term (current) drug therapy: Secondary | ICD-10-CM | POA: Diagnosis not present

## 2014-10-02 DIAGNOSIS — N179 Acute kidney failure, unspecified: Secondary | ICD-10-CM | POA: Diagnosis not present

## 2014-10-02 DIAGNOSIS — Z940 Kidney transplant status: Secondary | ICD-10-CM | POA: Diagnosis not present

## 2014-10-02 DIAGNOSIS — Z4822 Encounter for aftercare following kidney transplant: Secondary | ICD-10-CM | POA: Diagnosis not present

## 2014-10-02 DIAGNOSIS — T8611 Kidney transplant rejection: Secondary | ICD-10-CM | POA: Diagnosis not present

## 2014-10-02 DIAGNOSIS — K612 Anorectal abscess: Secondary | ICD-10-CM | POA: Diagnosis not present

## 2014-10-02 DIAGNOSIS — Z992 Dependence on renal dialysis: Secondary | ICD-10-CM | POA: Diagnosis not present

## 2014-10-02 DIAGNOSIS — Z5181 Encounter for therapeutic drug level monitoring: Secondary | ICD-10-CM | POA: Diagnosis not present

## 2014-10-03 DIAGNOSIS — N186 End stage renal disease: Secondary | ICD-10-CM | POA: Diagnosis not present

## 2014-10-03 DIAGNOSIS — Z940 Kidney transplant status: Secondary | ICD-10-CM | POA: Diagnosis not present

## 2014-10-03 DIAGNOSIS — K626 Ulcer of anus and rectum: Secondary | ICD-10-CM | POA: Diagnosis not present

## 2014-10-03 DIAGNOSIS — Z5181 Encounter for therapeutic drug level monitoring: Secondary | ICD-10-CM | POA: Diagnosis not present

## 2014-10-03 DIAGNOSIS — Z79899 Other long term (current) drug therapy: Secondary | ICD-10-CM | POA: Diagnosis not present

## 2014-10-03 DIAGNOSIS — Z4822 Encounter for aftercare following kidney transplant: Secondary | ICD-10-CM | POA: Diagnosis not present

## 2014-10-03 DIAGNOSIS — Z992 Dependence on renal dialysis: Secondary | ICD-10-CM | POA: Diagnosis not present

## 2014-10-03 DIAGNOSIS — N179 Acute kidney failure, unspecified: Secondary | ICD-10-CM | POA: Diagnosis not present

## 2014-10-03 DIAGNOSIS — T8611 Kidney transplant rejection: Secondary | ICD-10-CM | POA: Diagnosis not present

## 2014-10-03 DIAGNOSIS — I10 Essential (primary) hypertension: Secondary | ICD-10-CM | POA: Diagnosis not present

## 2014-10-03 DIAGNOSIS — K611 Rectal abscess: Secondary | ICD-10-CM | POA: Diagnosis not present

## 2014-10-04 DIAGNOSIS — E8809 Other disorders of plasma-protein metabolism, not elsewhere classified: Secondary | ICD-10-CM | POA: Diagnosis present

## 2014-10-04 DIAGNOSIS — Z7952 Long term (current) use of systemic steroids: Secondary | ICD-10-CM | POA: Diagnosis not present

## 2014-10-04 DIAGNOSIS — K602 Anal fissure, unspecified: Secondary | ICD-10-CM | POA: Diagnosis present

## 2014-10-04 DIAGNOSIS — Z79899 Other long term (current) drug therapy: Secondary | ICD-10-CM | POA: Diagnosis not present

## 2014-10-04 DIAGNOSIS — Z833 Family history of diabetes mellitus: Secondary | ICD-10-CM | POA: Diagnosis not present

## 2014-10-04 DIAGNOSIS — Z940 Kidney transplant status: Secondary | ICD-10-CM | POA: Diagnosis not present

## 2014-10-04 DIAGNOSIS — D631 Anemia in chronic kidney disease: Secondary | ICD-10-CM | POA: Diagnosis present

## 2014-10-04 DIAGNOSIS — N2581 Secondary hyperparathyroidism of renal origin: Secondary | ICD-10-CM | POA: Diagnosis present

## 2014-10-04 DIAGNOSIS — Z5181 Encounter for therapeutic drug level monitoring: Secondary | ICD-10-CM | POA: Diagnosis not present

## 2014-10-04 DIAGNOSIS — I10 Essential (primary) hypertension: Secondary | ICD-10-CM | POA: Diagnosis present

## 2014-10-04 DIAGNOSIS — K612 Anorectal abscess: Secondary | ICD-10-CM | POA: Diagnosis present

## 2014-10-04 DIAGNOSIS — T8611 Kidney transplant rejection: Secondary | ICD-10-CM | POA: Diagnosis present

## 2014-10-04 DIAGNOSIS — Z4822 Encounter for aftercare following kidney transplant: Secondary | ICD-10-CM | POA: Diagnosis not present

## 2014-10-05 DIAGNOSIS — N186 End stage renal disease: Secondary | ICD-10-CM | POA: Diagnosis not present

## 2014-10-05 DIAGNOSIS — N2581 Secondary hyperparathyroidism of renal origin: Secondary | ICD-10-CM | POA: Diagnosis not present

## 2014-10-05 DIAGNOSIS — D631 Anemia in chronic kidney disease: Secondary | ICD-10-CM | POA: Diagnosis not present

## 2014-10-05 DIAGNOSIS — D509 Iron deficiency anemia, unspecified: Secondary | ICD-10-CM | POA: Diagnosis not present

## 2014-10-08 DIAGNOSIS — N2581 Secondary hyperparathyroidism of renal origin: Secondary | ICD-10-CM | POA: Diagnosis not present

## 2014-10-08 DIAGNOSIS — D509 Iron deficiency anemia, unspecified: Secondary | ICD-10-CM | POA: Diagnosis not present

## 2014-10-08 DIAGNOSIS — N186 End stage renal disease: Secondary | ICD-10-CM | POA: Diagnosis not present

## 2014-10-08 DIAGNOSIS — D631 Anemia in chronic kidney disease: Secondary | ICD-10-CM | POA: Diagnosis not present

## 2014-10-10 DIAGNOSIS — D899 Disorder involving the immune mechanism, unspecified: Secondary | ICD-10-CM | POA: Diagnosis not present

## 2014-10-10 DIAGNOSIS — E872 Acidosis: Secondary | ICD-10-CM | POA: Diagnosis not present

## 2014-10-10 DIAGNOSIS — N2581 Secondary hyperparathyroidism of renal origin: Secondary | ICD-10-CM | POA: Diagnosis not present

## 2014-10-10 DIAGNOSIS — N189 Chronic kidney disease, unspecified: Secondary | ICD-10-CM | POA: Diagnosis not present

## 2014-10-10 DIAGNOSIS — N186 End stage renal disease: Secondary | ICD-10-CM | POA: Diagnosis not present

## 2014-10-10 DIAGNOSIS — Z992 Dependence on renal dialysis: Secondary | ICD-10-CM | POA: Diagnosis not present

## 2014-10-10 DIAGNOSIS — N039 Chronic nephritic syndrome with unspecified morphologic changes: Secondary | ICD-10-CM | POA: Diagnosis not present

## 2014-10-10 DIAGNOSIS — Z79899 Other long term (current) drug therapy: Secondary | ICD-10-CM | POA: Diagnosis not present

## 2014-10-10 DIAGNOSIS — Z5181 Encounter for therapeutic drug level monitoring: Secondary | ICD-10-CM | POA: Diagnosis not present

## 2014-10-10 DIAGNOSIS — T8610 Unspecified complication of kidney transplant: Secondary | ICD-10-CM | POA: Diagnosis not present

## 2014-10-10 DIAGNOSIS — I129 Hypertensive chronic kidney disease with stage 1 through stage 4 chronic kidney disease, or unspecified chronic kidney disease: Secondary | ICD-10-CM | POA: Diagnosis not present

## 2014-10-10 DIAGNOSIS — N179 Acute kidney failure, unspecified: Secondary | ICD-10-CM | POA: Diagnosis not present

## 2014-10-10 DIAGNOSIS — K600 Acute anal fissure: Secondary | ICD-10-CM | POA: Diagnosis not present

## 2014-10-10 DIAGNOSIS — Z940 Kidney transplant status: Secondary | ICD-10-CM | POA: Diagnosis not present

## 2014-10-10 DIAGNOSIS — I120 Hypertensive chronic kidney disease with stage 5 chronic kidney disease or end stage renal disease: Secondary | ICD-10-CM | POA: Diagnosis not present

## 2014-10-10 DIAGNOSIS — T8611 Kidney transplant rejection: Secondary | ICD-10-CM | POA: Diagnosis not present

## 2014-10-10 DIAGNOSIS — D509 Iron deficiency anemia, unspecified: Secondary | ICD-10-CM | POA: Diagnosis not present

## 2014-10-10 DIAGNOSIS — R197 Diarrhea, unspecified: Secondary | ICD-10-CM | POA: Diagnosis not present

## 2014-10-10 DIAGNOSIS — D631 Anemia in chronic kidney disease: Secondary | ICD-10-CM | POA: Diagnosis not present

## 2014-10-10 DIAGNOSIS — D72819 Decreased white blood cell count, unspecified: Secondary | ICD-10-CM | POA: Diagnosis not present

## 2014-10-15 DIAGNOSIS — N186 End stage renal disease: Secondary | ICD-10-CM | POA: Diagnosis not present

## 2014-10-15 DIAGNOSIS — D631 Anemia in chronic kidney disease: Secondary | ICD-10-CM | POA: Diagnosis not present

## 2014-10-15 DIAGNOSIS — N2581 Secondary hyperparathyroidism of renal origin: Secondary | ICD-10-CM | POA: Diagnosis not present

## 2014-10-15 DIAGNOSIS — D509 Iron deficiency anemia, unspecified: Secondary | ICD-10-CM | POA: Diagnosis not present

## 2014-10-17 DIAGNOSIS — N179 Acute kidney failure, unspecified: Secondary | ICD-10-CM | POA: Diagnosis not present

## 2014-10-17 DIAGNOSIS — N2581 Secondary hyperparathyroidism of renal origin: Secondary | ICD-10-CM | POA: Diagnosis not present

## 2014-10-17 DIAGNOSIS — N185 Chronic kidney disease, stage 5: Secondary | ICD-10-CM | POA: Diagnosis not present

## 2014-10-17 DIAGNOSIS — Z940 Kidney transplant status: Secondary | ICD-10-CM | POA: Diagnosis not present

## 2014-10-17 DIAGNOSIS — D509 Iron deficiency anemia, unspecified: Secondary | ICD-10-CM | POA: Diagnosis not present

## 2014-10-17 DIAGNOSIS — Z5181 Encounter for therapeutic drug level monitoring: Secondary | ICD-10-CM | POA: Diagnosis not present

## 2014-10-17 DIAGNOSIS — T8611 Kidney transplant rejection: Secondary | ICD-10-CM | POA: Diagnosis not present

## 2014-10-17 DIAGNOSIS — T8610 Unspecified complication of kidney transplant: Secondary | ICD-10-CM | POA: Diagnosis not present

## 2014-10-17 DIAGNOSIS — D631 Anemia in chronic kidney disease: Secondary | ICD-10-CM | POA: Diagnosis not present

## 2014-10-17 DIAGNOSIS — Z79899 Other long term (current) drug therapy: Secondary | ICD-10-CM | POA: Diagnosis not present

## 2014-10-17 DIAGNOSIS — Z992 Dependence on renal dialysis: Secondary | ICD-10-CM | POA: Diagnosis not present

## 2014-10-17 DIAGNOSIS — R197 Diarrhea, unspecified: Secondary | ICD-10-CM | POA: Diagnosis not present

## 2014-10-17 DIAGNOSIS — D899 Disorder involving the immune mechanism, unspecified: Secondary | ICD-10-CM | POA: Diagnosis not present

## 2014-10-17 DIAGNOSIS — N186 End stage renal disease: Secondary | ICD-10-CM | POA: Diagnosis not present

## 2014-10-17 DIAGNOSIS — I120 Hypertensive chronic kidney disease with stage 5 chronic kidney disease or end stage renal disease: Secondary | ICD-10-CM | POA: Diagnosis not present

## 2014-10-17 DIAGNOSIS — N039 Chronic nephritic syndrome with unspecified morphologic changes: Secondary | ICD-10-CM | POA: Diagnosis not present

## 2014-10-19 DIAGNOSIS — N186 End stage renal disease: Secondary | ICD-10-CM | POA: Diagnosis not present

## 2014-10-19 DIAGNOSIS — N2581 Secondary hyperparathyroidism of renal origin: Secondary | ICD-10-CM | POA: Diagnosis not present

## 2014-10-19 DIAGNOSIS — D509 Iron deficiency anemia, unspecified: Secondary | ICD-10-CM | POA: Diagnosis not present

## 2014-10-19 DIAGNOSIS — D631 Anemia in chronic kidney disease: Secondary | ICD-10-CM | POA: Diagnosis not present

## 2014-10-22 DIAGNOSIS — D631 Anemia in chronic kidney disease: Secondary | ICD-10-CM | POA: Diagnosis not present

## 2014-10-22 DIAGNOSIS — N186 End stage renal disease: Secondary | ICD-10-CM | POA: Diagnosis not present

## 2014-10-22 DIAGNOSIS — N2581 Secondary hyperparathyroidism of renal origin: Secondary | ICD-10-CM | POA: Diagnosis not present

## 2014-10-22 DIAGNOSIS — D509 Iron deficiency anemia, unspecified: Secondary | ICD-10-CM | POA: Diagnosis not present

## 2014-10-22 DIAGNOSIS — Z992 Dependence on renal dialysis: Secondary | ICD-10-CM | POA: Diagnosis not present

## 2014-10-26 DIAGNOSIS — N2581 Secondary hyperparathyroidism of renal origin: Secondary | ICD-10-CM | POA: Diagnosis not present

## 2014-10-26 DIAGNOSIS — N186 End stage renal disease: Secondary | ICD-10-CM | POA: Diagnosis not present

## 2014-10-26 DIAGNOSIS — D631 Anemia in chronic kidney disease: Secondary | ICD-10-CM | POA: Diagnosis not present

## 2014-10-26 DIAGNOSIS — D509 Iron deficiency anemia, unspecified: Secondary | ICD-10-CM | POA: Diagnosis not present

## 2014-10-31 DIAGNOSIS — E878 Other disorders of electrolyte and fluid balance, not elsewhere classified: Secondary | ICD-10-CM | POA: Diagnosis not present

## 2014-11-05 DIAGNOSIS — N2581 Secondary hyperparathyroidism of renal origin: Secondary | ICD-10-CM | POA: Diagnosis not present

## 2014-11-05 DIAGNOSIS — D631 Anemia in chronic kidney disease: Secondary | ICD-10-CM | POA: Diagnosis not present

## 2014-11-05 DIAGNOSIS — D509 Iron deficiency anemia, unspecified: Secondary | ICD-10-CM | POA: Diagnosis not present

## 2014-11-05 DIAGNOSIS — N186 End stage renal disease: Secondary | ICD-10-CM | POA: Diagnosis not present

## 2014-11-07 DIAGNOSIS — D509 Iron deficiency anemia, unspecified: Secondary | ICD-10-CM | POA: Diagnosis not present

## 2014-11-07 DIAGNOSIS — N2581 Secondary hyperparathyroidism of renal origin: Secondary | ICD-10-CM | POA: Diagnosis not present

## 2014-11-07 DIAGNOSIS — N186 End stage renal disease: Secondary | ICD-10-CM | POA: Diagnosis not present

## 2014-11-07 DIAGNOSIS — D631 Anemia in chronic kidney disease: Secondary | ICD-10-CM | POA: Diagnosis not present

## 2014-11-12 DIAGNOSIS — N186 End stage renal disease: Secondary | ICD-10-CM | POA: Diagnosis not present

## 2014-11-12 DIAGNOSIS — N2581 Secondary hyperparathyroidism of renal origin: Secondary | ICD-10-CM | POA: Diagnosis not present

## 2014-11-12 DIAGNOSIS — D631 Anemia in chronic kidney disease: Secondary | ICD-10-CM | POA: Diagnosis not present

## 2014-11-12 DIAGNOSIS — D509 Iron deficiency anemia, unspecified: Secondary | ICD-10-CM | POA: Diagnosis not present

## 2014-11-14 DIAGNOSIS — D631 Anemia in chronic kidney disease: Secondary | ICD-10-CM | POA: Diagnosis not present

## 2014-11-14 DIAGNOSIS — N2581 Secondary hyperparathyroidism of renal origin: Secondary | ICD-10-CM | POA: Diagnosis not present

## 2014-11-14 DIAGNOSIS — D509 Iron deficiency anemia, unspecified: Secondary | ICD-10-CM | POA: Diagnosis not present

## 2014-11-14 DIAGNOSIS — N186 End stage renal disease: Secondary | ICD-10-CM | POA: Diagnosis not present

## 2014-11-19 DIAGNOSIS — N2581 Secondary hyperparathyroidism of renal origin: Secondary | ICD-10-CM | POA: Diagnosis not present

## 2014-11-19 DIAGNOSIS — N186 End stage renal disease: Secondary | ICD-10-CM | POA: Diagnosis not present

## 2014-11-19 DIAGNOSIS — D509 Iron deficiency anemia, unspecified: Secondary | ICD-10-CM | POA: Diagnosis not present

## 2014-11-19 DIAGNOSIS — D631 Anemia in chronic kidney disease: Secondary | ICD-10-CM | POA: Diagnosis not present

## 2014-11-21 DIAGNOSIS — N2581 Secondary hyperparathyroidism of renal origin: Secondary | ICD-10-CM | POA: Diagnosis not present

## 2014-11-21 DIAGNOSIS — N186 End stage renal disease: Secondary | ICD-10-CM | POA: Diagnosis not present

## 2014-11-21 DIAGNOSIS — D509 Iron deficiency anemia, unspecified: Secondary | ICD-10-CM | POA: Diagnosis not present

## 2014-11-21 DIAGNOSIS — D631 Anemia in chronic kidney disease: Secondary | ICD-10-CM | POA: Diagnosis not present

## 2014-11-22 DIAGNOSIS — Z992 Dependence on renal dialysis: Secondary | ICD-10-CM | POA: Diagnosis not present

## 2014-11-22 DIAGNOSIS — N186 End stage renal disease: Secondary | ICD-10-CM | POA: Diagnosis not present

## 2014-11-23 DIAGNOSIS — D631 Anemia in chronic kidney disease: Secondary | ICD-10-CM | POA: Diagnosis not present

## 2014-11-23 DIAGNOSIS — N186 End stage renal disease: Secondary | ICD-10-CM | POA: Diagnosis not present

## 2014-11-23 DIAGNOSIS — D509 Iron deficiency anemia, unspecified: Secondary | ICD-10-CM | POA: Diagnosis not present

## 2014-11-23 DIAGNOSIS — N2581 Secondary hyperparathyroidism of renal origin: Secondary | ICD-10-CM | POA: Diagnosis not present

## 2014-11-28 DIAGNOSIS — N2581 Secondary hyperparathyroidism of renal origin: Secondary | ICD-10-CM | POA: Diagnosis not present

## 2014-11-28 DIAGNOSIS — D509 Iron deficiency anemia, unspecified: Secondary | ICD-10-CM | POA: Diagnosis not present

## 2014-11-28 DIAGNOSIS — N186 End stage renal disease: Secondary | ICD-10-CM | POA: Diagnosis not present

## 2014-11-28 DIAGNOSIS — D631 Anemia in chronic kidney disease: Secondary | ICD-10-CM | POA: Diagnosis not present

## 2014-11-30 DIAGNOSIS — D631 Anemia in chronic kidney disease: Secondary | ICD-10-CM | POA: Diagnosis not present

## 2014-11-30 DIAGNOSIS — N186 End stage renal disease: Secondary | ICD-10-CM | POA: Diagnosis not present

## 2014-11-30 DIAGNOSIS — D509 Iron deficiency anemia, unspecified: Secondary | ICD-10-CM | POA: Diagnosis not present

## 2014-11-30 DIAGNOSIS — N2581 Secondary hyperparathyroidism of renal origin: Secondary | ICD-10-CM | POA: Diagnosis not present

## 2014-12-07 DIAGNOSIS — D631 Anemia in chronic kidney disease: Secondary | ICD-10-CM | POA: Diagnosis not present

## 2014-12-07 DIAGNOSIS — D509 Iron deficiency anemia, unspecified: Secondary | ICD-10-CM | POA: Diagnosis not present

## 2014-12-07 DIAGNOSIS — N186 End stage renal disease: Secondary | ICD-10-CM | POA: Diagnosis not present

## 2014-12-07 DIAGNOSIS — N2581 Secondary hyperparathyroidism of renal origin: Secondary | ICD-10-CM | POA: Diagnosis not present

## 2014-12-10 DIAGNOSIS — D631 Anemia in chronic kidney disease: Secondary | ICD-10-CM | POA: Diagnosis not present

## 2014-12-10 DIAGNOSIS — D509 Iron deficiency anemia, unspecified: Secondary | ICD-10-CM | POA: Diagnosis not present

## 2014-12-10 DIAGNOSIS — N2581 Secondary hyperparathyroidism of renal origin: Secondary | ICD-10-CM | POA: Diagnosis not present

## 2014-12-10 DIAGNOSIS — N186 End stage renal disease: Secondary | ICD-10-CM | POA: Diagnosis not present

## 2014-12-12 DIAGNOSIS — N186 End stage renal disease: Secondary | ICD-10-CM | POA: Diagnosis not present

## 2014-12-12 DIAGNOSIS — D509 Iron deficiency anemia, unspecified: Secondary | ICD-10-CM | POA: Diagnosis not present

## 2014-12-12 DIAGNOSIS — D631 Anemia in chronic kidney disease: Secondary | ICD-10-CM | POA: Diagnosis not present

## 2014-12-12 DIAGNOSIS — N2581 Secondary hyperparathyroidism of renal origin: Secondary | ICD-10-CM | POA: Diagnosis not present

## 2014-12-14 DIAGNOSIS — D509 Iron deficiency anemia, unspecified: Secondary | ICD-10-CM | POA: Diagnosis not present

## 2014-12-14 DIAGNOSIS — D631 Anemia in chronic kidney disease: Secondary | ICD-10-CM | POA: Diagnosis not present

## 2014-12-14 DIAGNOSIS — N186 End stage renal disease: Secondary | ICD-10-CM | POA: Diagnosis not present

## 2014-12-14 DIAGNOSIS — N2581 Secondary hyperparathyroidism of renal origin: Secondary | ICD-10-CM | POA: Diagnosis not present

## 2014-12-19 DIAGNOSIS — D631 Anemia in chronic kidney disease: Secondary | ICD-10-CM | POA: Diagnosis not present

## 2014-12-19 DIAGNOSIS — N2581 Secondary hyperparathyroidism of renal origin: Secondary | ICD-10-CM | POA: Diagnosis not present

## 2014-12-19 DIAGNOSIS — D509 Iron deficiency anemia, unspecified: Secondary | ICD-10-CM | POA: Diagnosis not present

## 2014-12-19 DIAGNOSIS — N186 End stage renal disease: Secondary | ICD-10-CM | POA: Diagnosis not present

## 2014-12-21 DIAGNOSIS — N186 End stage renal disease: Secondary | ICD-10-CM | POA: Diagnosis not present

## 2014-12-21 DIAGNOSIS — D509 Iron deficiency anemia, unspecified: Secondary | ICD-10-CM | POA: Diagnosis not present

## 2014-12-21 DIAGNOSIS — D631 Anemia in chronic kidney disease: Secondary | ICD-10-CM | POA: Diagnosis not present

## 2014-12-21 DIAGNOSIS — N2581 Secondary hyperparathyroidism of renal origin: Secondary | ICD-10-CM | POA: Diagnosis not present

## 2014-12-24 DIAGNOSIS — N186 End stage renal disease: Secondary | ICD-10-CM | POA: Diagnosis not present

## 2014-12-24 DIAGNOSIS — D509 Iron deficiency anemia, unspecified: Secondary | ICD-10-CM | POA: Diagnosis not present

## 2014-12-24 DIAGNOSIS — D631 Anemia in chronic kidney disease: Secondary | ICD-10-CM | POA: Diagnosis not present

## 2014-12-24 DIAGNOSIS — N2581 Secondary hyperparathyroidism of renal origin: Secondary | ICD-10-CM | POA: Diagnosis not present

## 2014-12-26 DIAGNOSIS — N2581 Secondary hyperparathyroidism of renal origin: Secondary | ICD-10-CM | POA: Diagnosis not present

## 2014-12-26 DIAGNOSIS — D509 Iron deficiency anemia, unspecified: Secondary | ICD-10-CM | POA: Diagnosis not present

## 2014-12-26 DIAGNOSIS — D631 Anemia in chronic kidney disease: Secondary | ICD-10-CM | POA: Diagnosis not present

## 2014-12-26 DIAGNOSIS — N186 End stage renal disease: Secondary | ICD-10-CM | POA: Diagnosis not present

## 2015-01-02 DIAGNOSIS — N186 End stage renal disease: Secondary | ICD-10-CM | POA: Diagnosis not present

## 2015-01-02 DIAGNOSIS — N2581 Secondary hyperparathyroidism of renal origin: Secondary | ICD-10-CM | POA: Diagnosis not present

## 2015-01-02 DIAGNOSIS — D631 Anemia in chronic kidney disease: Secondary | ICD-10-CM | POA: Diagnosis not present

## 2015-01-02 DIAGNOSIS — D509 Iron deficiency anemia, unspecified: Secondary | ICD-10-CM | POA: Diagnosis not present

## 2015-01-04 DIAGNOSIS — D631 Anemia in chronic kidney disease: Secondary | ICD-10-CM | POA: Diagnosis not present

## 2015-01-04 DIAGNOSIS — D509 Iron deficiency anemia, unspecified: Secondary | ICD-10-CM | POA: Diagnosis not present

## 2015-01-04 DIAGNOSIS — N2581 Secondary hyperparathyroidism of renal origin: Secondary | ICD-10-CM | POA: Diagnosis not present

## 2015-01-04 DIAGNOSIS — N186 End stage renal disease: Secondary | ICD-10-CM | POA: Diagnosis not present

## 2015-01-07 DIAGNOSIS — D509 Iron deficiency anemia, unspecified: Secondary | ICD-10-CM | POA: Diagnosis not present

## 2015-01-07 DIAGNOSIS — N186 End stage renal disease: Secondary | ICD-10-CM | POA: Diagnosis not present

## 2015-01-07 DIAGNOSIS — D631 Anemia in chronic kidney disease: Secondary | ICD-10-CM | POA: Diagnosis not present

## 2015-01-07 DIAGNOSIS — N2581 Secondary hyperparathyroidism of renal origin: Secondary | ICD-10-CM | POA: Diagnosis not present

## 2015-01-09 DIAGNOSIS — D631 Anemia in chronic kidney disease: Secondary | ICD-10-CM | POA: Diagnosis not present

## 2015-01-09 DIAGNOSIS — N2581 Secondary hyperparathyroidism of renal origin: Secondary | ICD-10-CM | POA: Diagnosis not present

## 2015-01-09 DIAGNOSIS — N186 End stage renal disease: Secondary | ICD-10-CM | POA: Diagnosis not present

## 2015-01-09 DIAGNOSIS — D509 Iron deficiency anemia, unspecified: Secondary | ICD-10-CM | POA: Diagnosis not present

## 2015-01-11 DIAGNOSIS — D631 Anemia in chronic kidney disease: Secondary | ICD-10-CM | POA: Diagnosis not present

## 2015-01-11 DIAGNOSIS — N2581 Secondary hyperparathyroidism of renal origin: Secondary | ICD-10-CM | POA: Diagnosis not present

## 2015-01-11 DIAGNOSIS — N186 End stage renal disease: Secondary | ICD-10-CM | POA: Diagnosis not present

## 2015-01-11 DIAGNOSIS — D509 Iron deficiency anemia, unspecified: Secondary | ICD-10-CM | POA: Diagnosis not present

## 2015-01-14 DIAGNOSIS — D631 Anemia in chronic kidney disease: Secondary | ICD-10-CM | POA: Diagnosis not present

## 2015-01-14 DIAGNOSIS — D509 Iron deficiency anemia, unspecified: Secondary | ICD-10-CM | POA: Diagnosis not present

## 2015-01-14 DIAGNOSIS — N186 End stage renal disease: Secondary | ICD-10-CM | POA: Diagnosis not present

## 2015-01-14 DIAGNOSIS — N2581 Secondary hyperparathyroidism of renal origin: Secondary | ICD-10-CM | POA: Diagnosis not present

## 2015-01-18 DIAGNOSIS — N186 End stage renal disease: Secondary | ICD-10-CM | POA: Diagnosis not present

## 2015-01-18 DIAGNOSIS — N2581 Secondary hyperparathyroidism of renal origin: Secondary | ICD-10-CM | POA: Diagnosis not present

## 2015-01-18 DIAGNOSIS — D631 Anemia in chronic kidney disease: Secondary | ICD-10-CM | POA: Diagnosis not present

## 2015-01-18 DIAGNOSIS — D509 Iron deficiency anemia, unspecified: Secondary | ICD-10-CM | POA: Diagnosis not present

## 2015-01-28 DIAGNOSIS — D631 Anemia in chronic kidney disease: Secondary | ICD-10-CM | POA: Diagnosis not present

## 2015-01-28 DIAGNOSIS — N2581 Secondary hyperparathyroidism of renal origin: Secondary | ICD-10-CM | POA: Diagnosis not present

## 2015-01-28 DIAGNOSIS — D509 Iron deficiency anemia, unspecified: Secondary | ICD-10-CM | POA: Diagnosis not present

## 2015-01-28 DIAGNOSIS — N186 End stage renal disease: Secondary | ICD-10-CM | POA: Diagnosis not present

## 2015-01-30 DIAGNOSIS — D631 Anemia in chronic kidney disease: Secondary | ICD-10-CM | POA: Diagnosis not present

## 2015-01-30 DIAGNOSIS — N186 End stage renal disease: Secondary | ICD-10-CM | POA: Diagnosis not present

## 2015-01-30 DIAGNOSIS — N2581 Secondary hyperparathyroidism of renal origin: Secondary | ICD-10-CM | POA: Diagnosis not present

## 2015-01-30 DIAGNOSIS — D509 Iron deficiency anemia, unspecified: Secondary | ICD-10-CM | POA: Diagnosis not present

## 2015-02-03 ENCOUNTER — Encounter (HOSPITAL_COMMUNITY): Payer: Self-pay | Admitting: *Deleted

## 2015-02-03 ENCOUNTER — Emergency Department (HOSPITAL_COMMUNITY)
Admission: EM | Admit: 2015-02-03 | Discharge: 2015-02-04 | Disposition: A | Discharge status: Home / Self Care | Payer: Medicaid Other | Attending: Emergency Medicine | Admitting: Emergency Medicine

## 2015-02-03 ENCOUNTER — Emergency Department (HOSPITAL_COMMUNITY): Payer: Medicaid Other

## 2015-02-03 DIAGNOSIS — R0602 Shortness of breath: Secondary | ICD-10-CM | POA: Diagnosis not present

## 2015-02-03 DIAGNOSIS — I10 Essential (primary) hypertension: Secondary | ICD-10-CM | POA: Diagnosis not present

## 2015-02-03 DIAGNOSIS — R079 Chest pain, unspecified: Secondary | ICD-10-CM | POA: Diagnosis not present

## 2015-02-03 DIAGNOSIS — Z79899 Other long term (current) drug therapy: Secondary | ICD-10-CM | POA: Diagnosis not present

## 2015-02-03 DIAGNOSIS — R1031 Right lower quadrant pain: Secondary | ICD-10-CM

## 2015-02-03 DIAGNOSIS — Z940 Kidney transplant status: Secondary | ICD-10-CM | POA: Diagnosis not present

## 2015-02-03 DIAGNOSIS — R630 Anorexia: Secondary | ICD-10-CM | POA: Diagnosis not present

## 2015-02-03 DIAGNOSIS — Z3202 Encounter for pregnancy test, result negative: Secondary | ICD-10-CM | POA: Diagnosis not present

## 2015-02-03 DIAGNOSIS — N390 Urinary tract infection, site not specified: Secondary | ICD-10-CM | POA: Insufficient documentation

## 2015-02-03 DIAGNOSIS — K219 Gastro-esophageal reflux disease without esophagitis: Secondary | ICD-10-CM | POA: Insufficient documentation

## 2015-02-03 DIAGNOSIS — Z7952 Long term (current) use of systemic steroids: Secondary | ICD-10-CM | POA: Diagnosis not present

## 2015-02-03 DIAGNOSIS — R5383 Other fatigue: Secondary | ICD-10-CM | POA: Insufficient documentation

## 2015-02-03 DIAGNOSIS — Z992 Dependence on renal dialysis: Secondary | ICD-10-CM | POA: Diagnosis not present

## 2015-02-03 DIAGNOSIS — R109 Unspecified abdominal pain: Secondary | ICD-10-CM | POA: Diagnosis not present

## 2015-02-03 DIAGNOSIS — R197 Diarrhea, unspecified: Secondary | ICD-10-CM | POA: Diagnosis not present

## 2015-02-03 LAB — URINALYSIS, ROUTINE W REFLEX MICROSCOPIC
Bilirubin Urine: NEGATIVE
Glucose, UA: NEGATIVE mg/dL
Ketones, ur: NEGATIVE mg/dL
Nitrite: NEGATIVE
Protein, ur: >300 mg/dL — AB
Specific Gravity, Urine: 1.026 (ref 1.005–1.030)
Urobilinogen, UA: 0.2 mg/dL (ref 0.0–1.0)
pH: 7.5 (ref 5.0–8.0)

## 2015-02-03 LAB — CBC WITH DIFFERENTIAL/PLATELET
Basophils Absolute: 0 10*3/uL (ref 0.0–0.1)
Basophils Relative: 0 % (ref 0–1)
Eosinophils Absolute: 1.4 10*3/uL — ABNORMAL HIGH (ref 0.0–0.7)
Eosinophils Relative: 20 % — ABNORMAL HIGH (ref 0–5)
HCT: 31.5 % — ABNORMAL LOW (ref 36.0–46.0)
Hemoglobin: 9.8 g/dL — ABNORMAL LOW (ref 12.0–15.0)
Lymphocytes Relative: 10 % — ABNORMAL LOW (ref 12–46)
Lymphs Abs: 0.7 10*3/uL (ref 0.7–4.0)
MCH: 28.2 pg (ref 26.0–34.0)
MCHC: 31.1 g/dL (ref 30.0–36.0)
MCV: 90.8 fL (ref 78.0–100.0)
Monocytes Absolute: 0.5 10*3/uL (ref 0.1–1.0)
Monocytes Relative: 6 % (ref 3–12)
Neutro Abs: 4.6 10*3/uL (ref 1.7–7.7)
Neutrophils Relative %: 64 % (ref 43–77)
Platelets: 286 10*3/uL (ref 150–400)
RBC: 3.47 MIL/uL — ABNORMAL LOW (ref 3.87–5.11)
RDW: 13.4 % (ref 11.5–15.5)
WBC: 7.2 10*3/uL (ref 4.0–10.5)

## 2015-02-03 LAB — URINE MICROSCOPIC-ADD ON

## 2015-02-03 LAB — COMPREHENSIVE METABOLIC PANEL
ALT: 14 U/L (ref 14–54)
AST: 28 U/L (ref 15–41)
Albumin: 3 g/dL — ABNORMAL LOW (ref 3.5–5.0)
Alkaline Phosphatase: 87 U/L (ref 38–126)
Anion gap: 15 (ref 5–15)
BUN: 39 mg/dL — ABNORMAL HIGH (ref 6–20)
CO2: 27 mmol/L (ref 22–32)
Calcium: 8.7 mg/dL — ABNORMAL LOW (ref 8.9–10.3)
Chloride: 91 mmol/L — ABNORMAL LOW (ref 101–111)
Creatinine, Ser: 17.24 mg/dL — ABNORMAL HIGH (ref 0.44–1.00)
GFR calc Af Amer: 3 mL/min — ABNORMAL LOW (ref >60)
GFR calc non Af Amer: 2 mL/min — ABNORMAL LOW (ref >60)
Glucose, Bld: 78 mg/dL (ref 65–99)
Potassium: 4.1 mmol/L (ref 3.5–5.1)
Sodium: 133 mmol/L — ABNORMAL LOW (ref 135–145)
Total Bilirubin: 0.5 mg/dL (ref 0.3–1.2)
Total Protein: 7.5 g/dL (ref 6.5–8.1)

## 2015-02-03 LAB — I-STAT TROPONIN, ED: Troponin i, poc: 0 ng/mL (ref 0.00–0.08)

## 2015-02-03 LAB — LIPASE, BLOOD: Lipase: 25 U/L (ref 22–51)

## 2015-02-03 LAB — POC URINE PREG, ED: Preg Test, Ur: NEGATIVE

## 2015-02-03 IMAGING — DX DG CHEST 2V
2 series · 2 of 2 positions shown · non-contrast
Comparison: Prior study from [DATE]

CLINICAL DATA: Initial evaluation for acute shortness of breath

EXAM:
CHEST  2 VIEW

[chest pa]
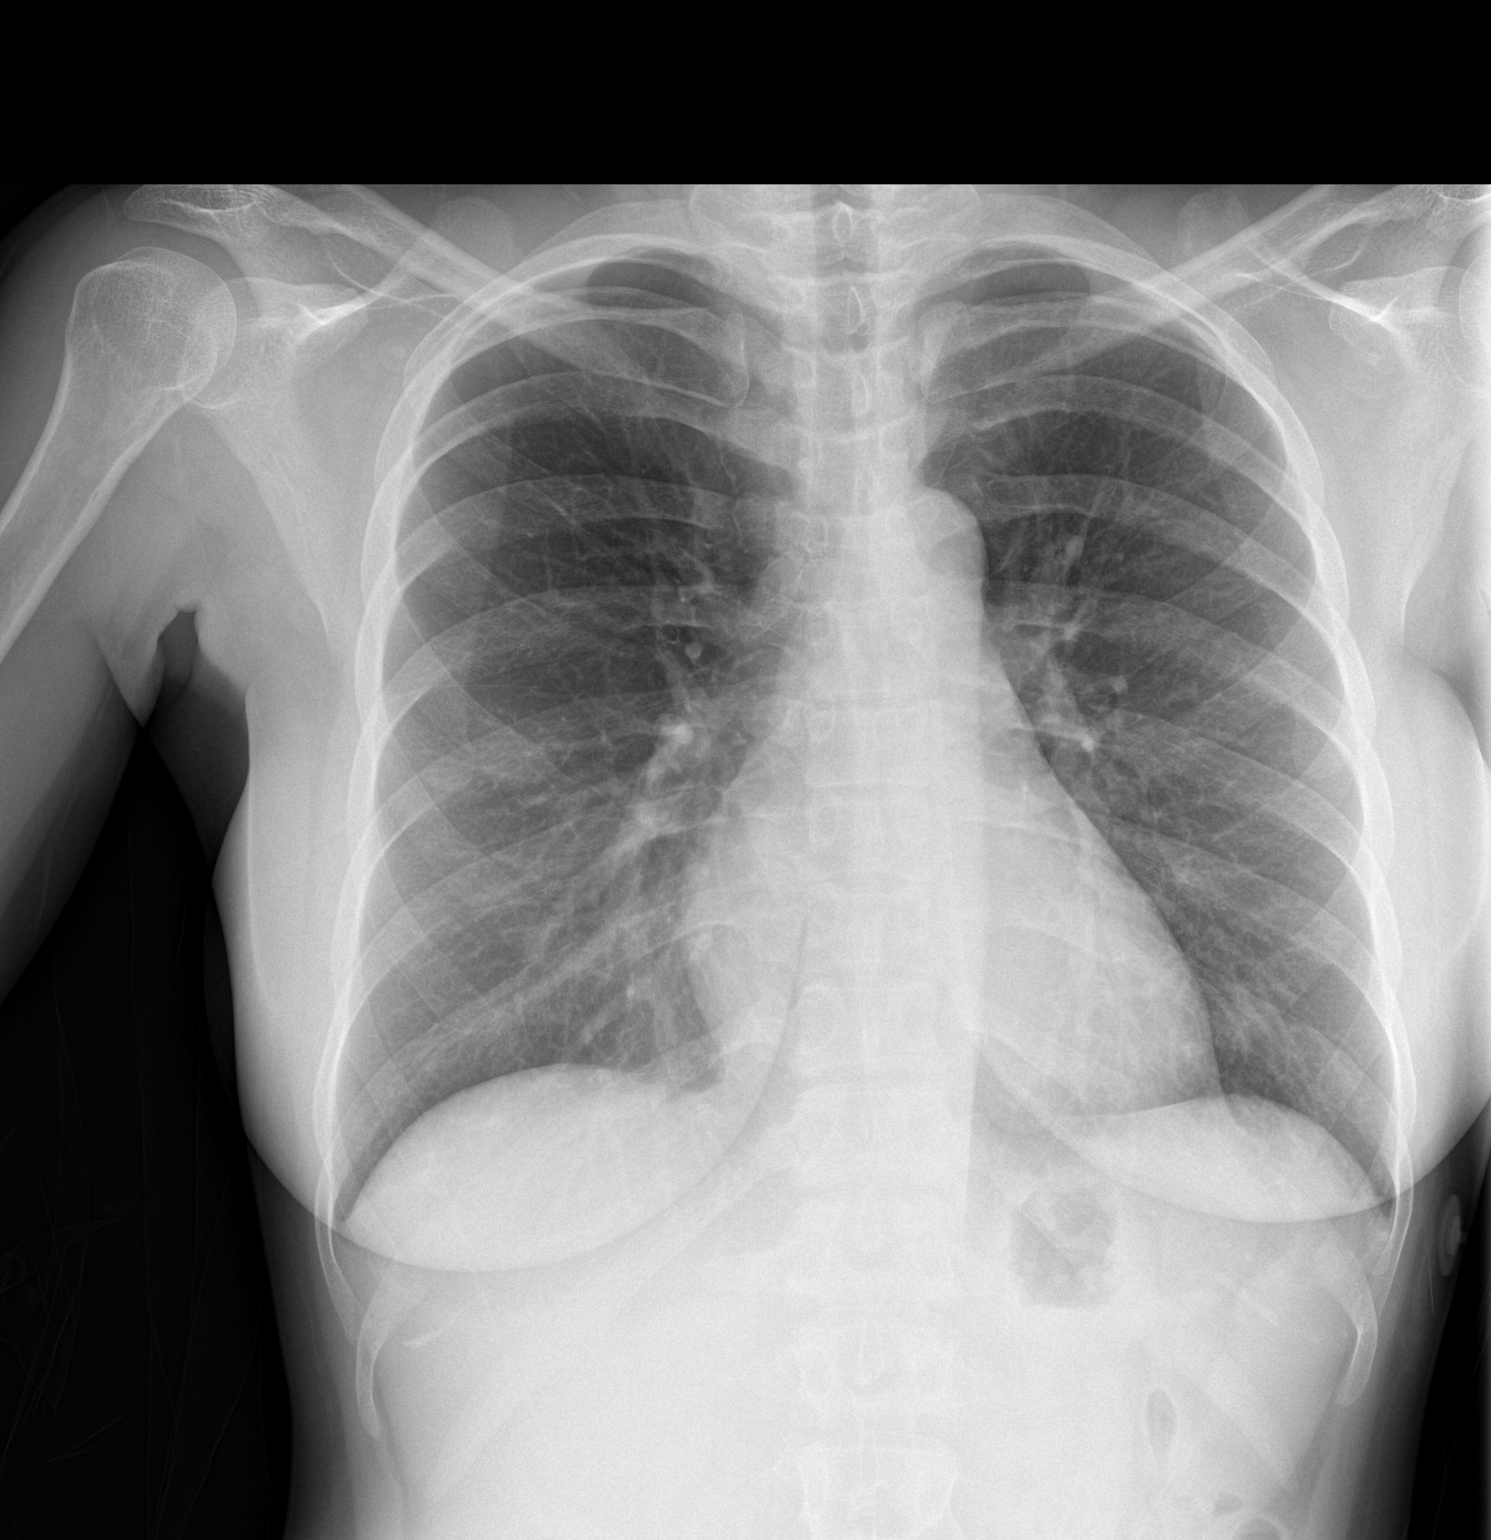

[chest lat]
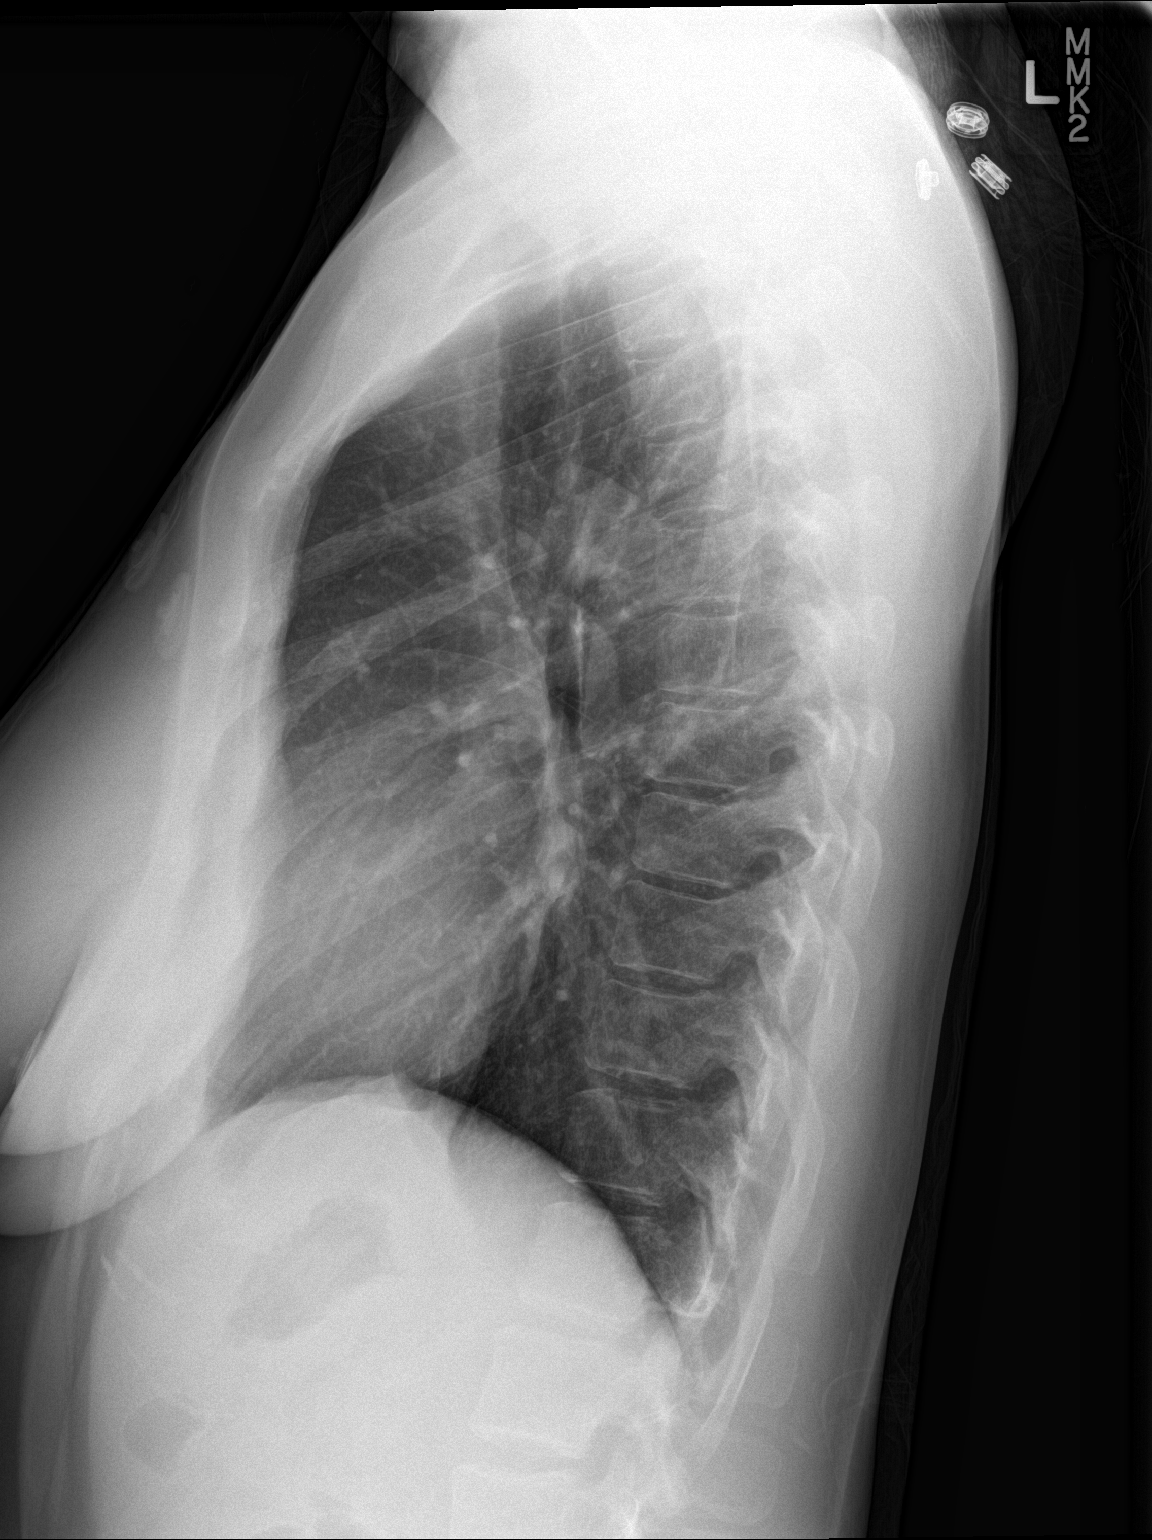

[2 of 2 positions shown; findings below may reference images not displayed]

FINDINGS: The cardiac and mediastinal silhouettes are stable in size and
contour, and remain within normal limits.

The lungs are normally inflated. No airspace consolidation, pleural
effusion, or pulmonary edema is identified. There is no
pneumothorax.

No acute osseous abnormality identified.
IMPRESSION: No active cardiopulmonary disease.

## 2015-02-03 IMAGING — CT CT ABD-PELV W/O CM
1 of 4 series · 4 of 46 positions shown, 5 images · non-contrast
Comparison: Ultrasound kidneys [DATE].

CLINICAL DATA: Right-sided abdominal pain. History of transplant
kidney not working properly. Back on hemodialysis since [REDACTED].
Decreasing frequency of urine. Pain over the transplant kidney.
Shortness of breath.

EXAM:
CT ABDOMEN AND PELVIS WITHOUT CONTRAST
TECHNIQUE: Multidetector CT imaging of the abdomen and pelvis was performed
following the standard protocol without IV contrast.

[Series 206: coronals · coronal · 0.45mm/px · 4 of 106 slices shown, 5 images]
[im 24/106  soft-tissue]
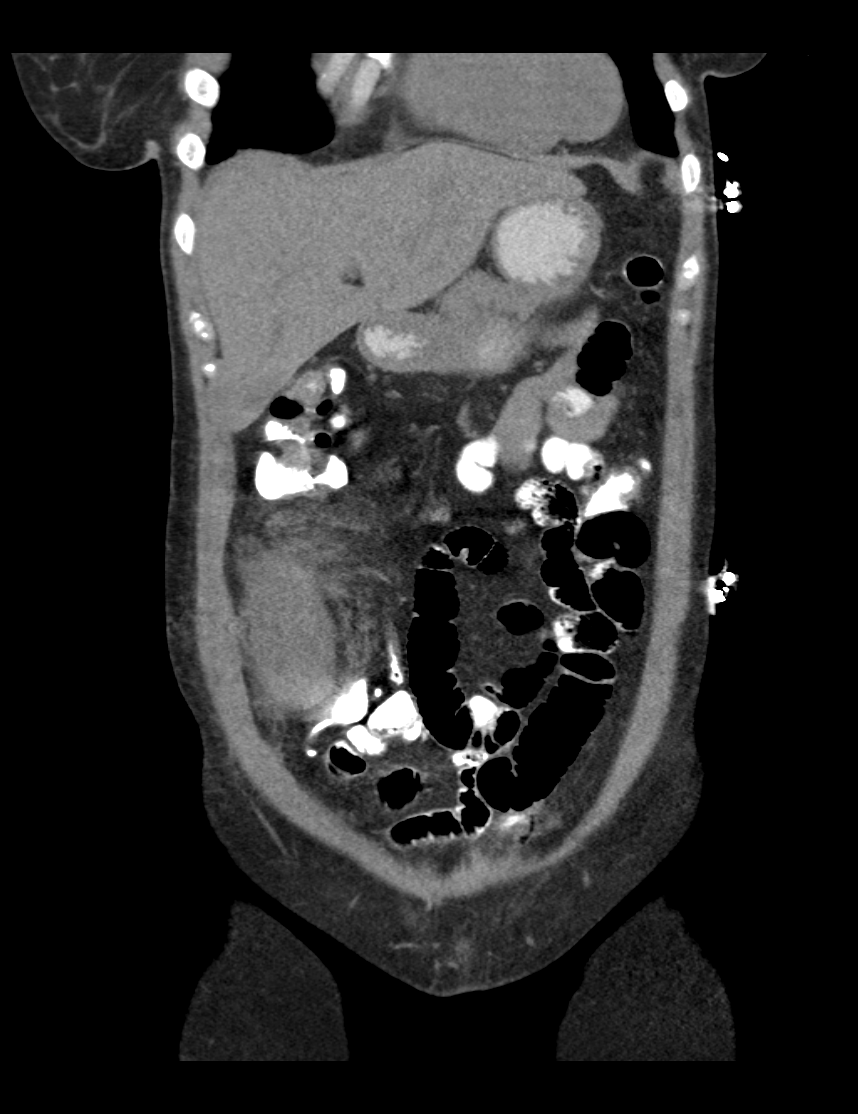
[im 24/106  bone]
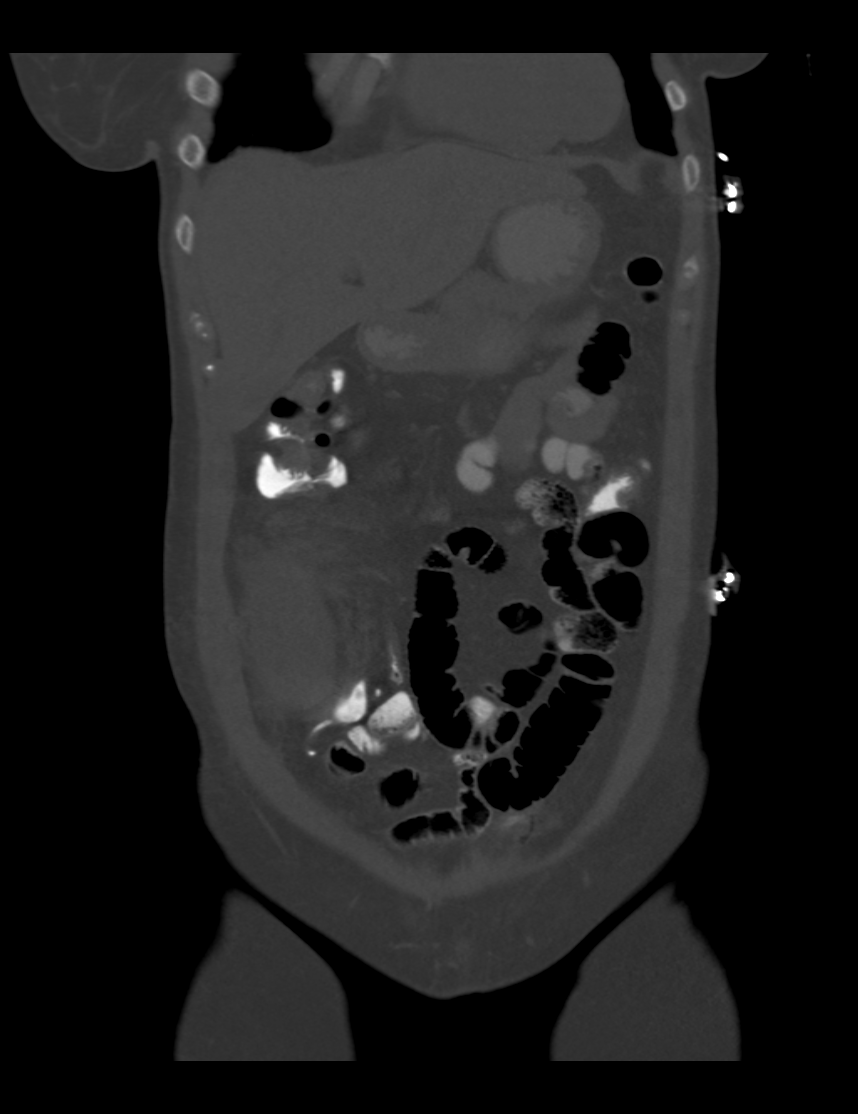
[im 47/106  soft-tissue]
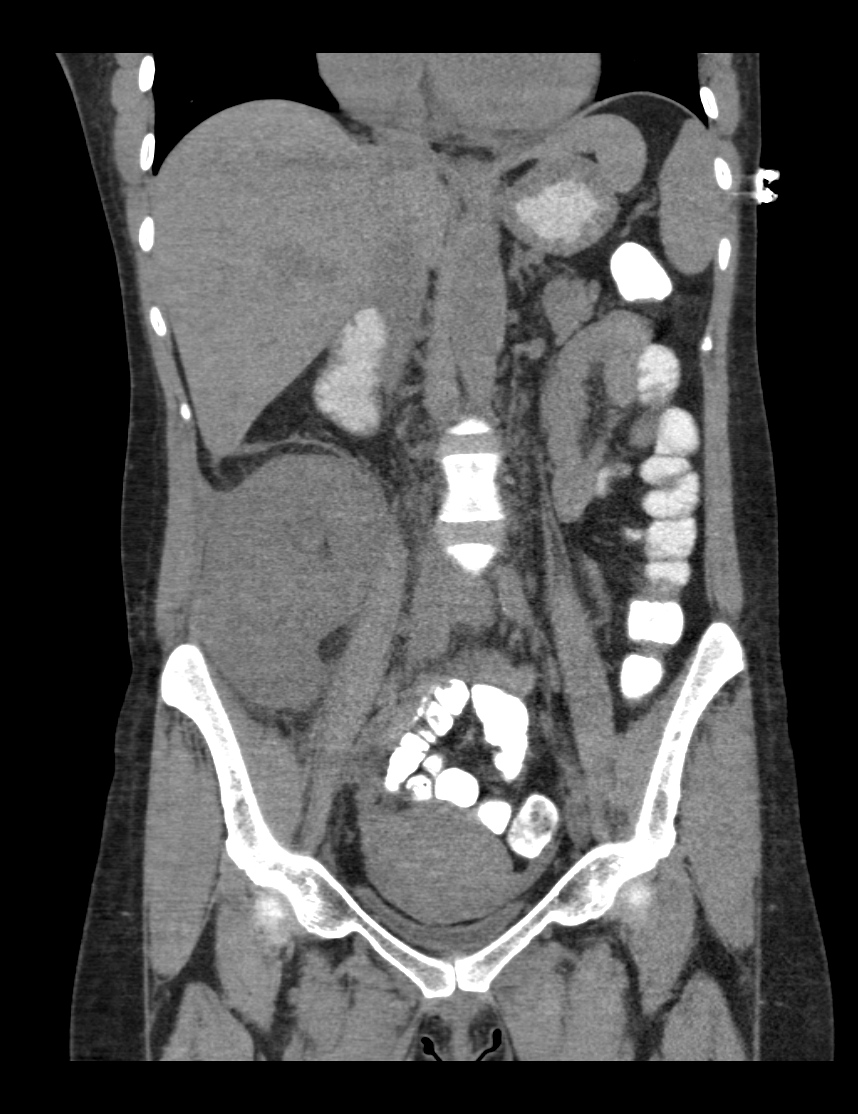
[im 71/106  soft-tissue]
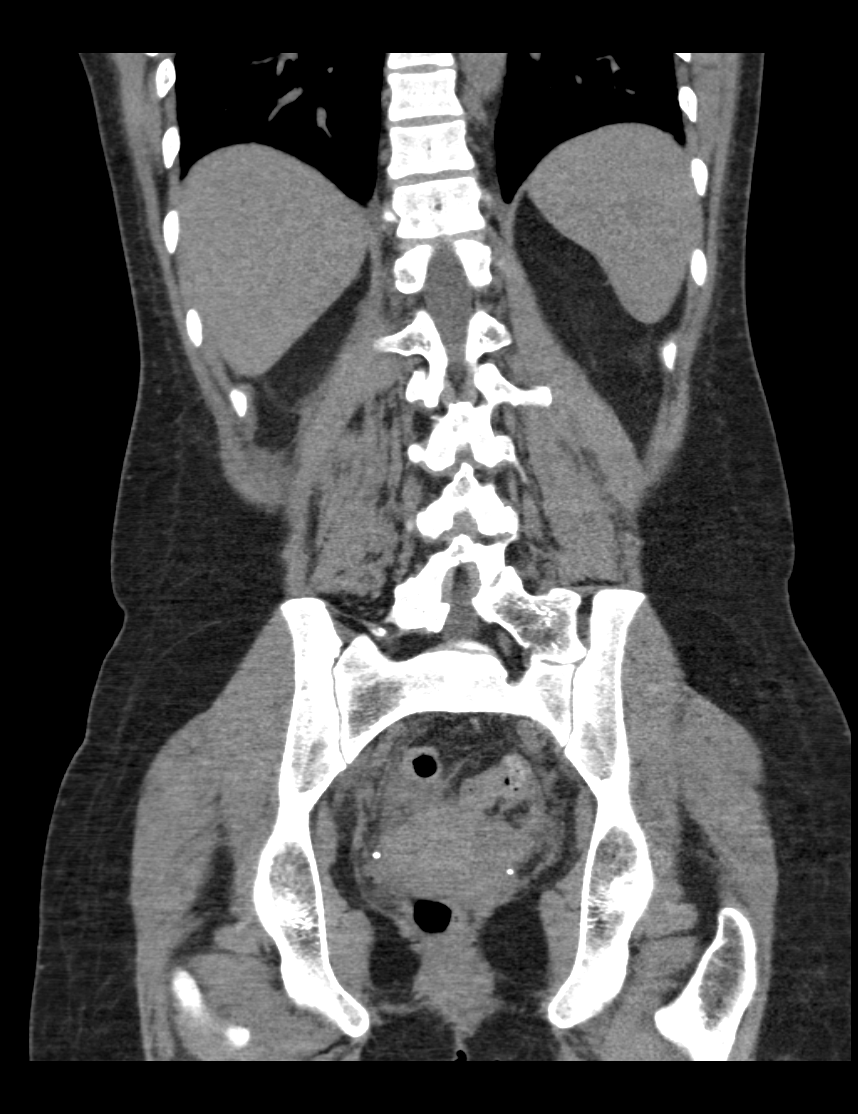
[im 94/106  soft-tissue]
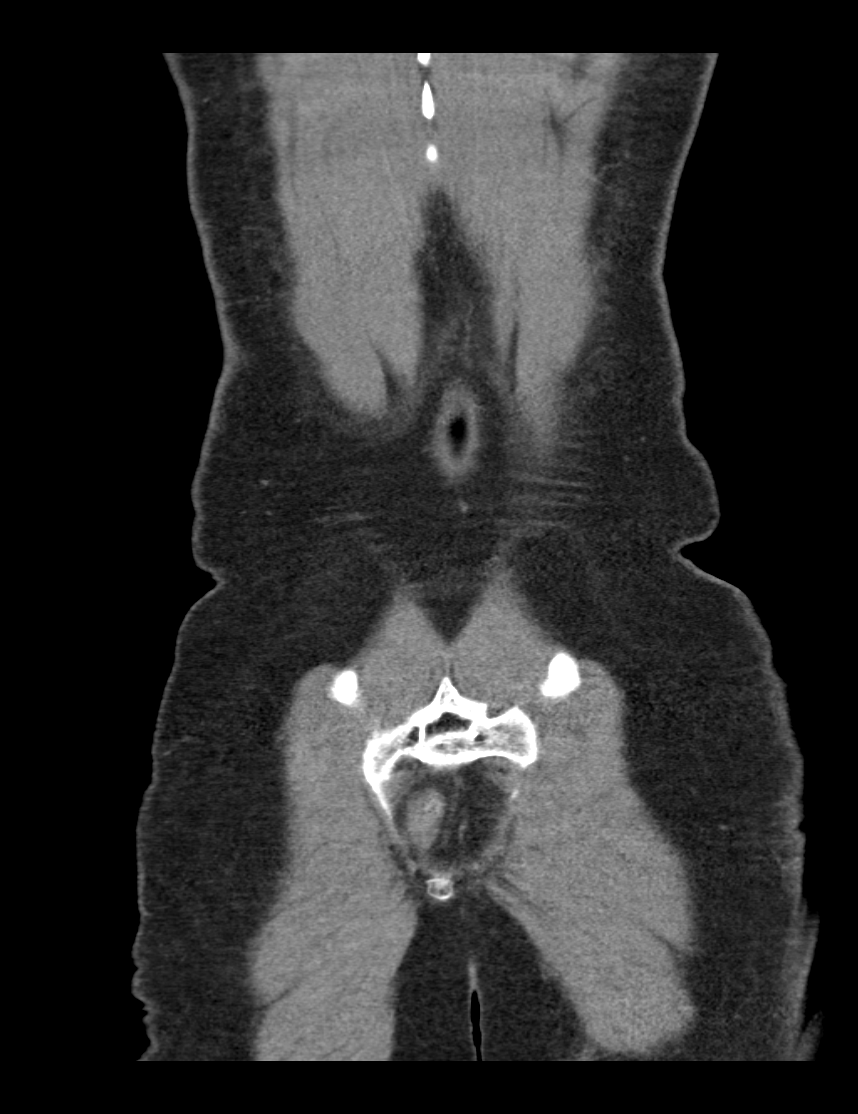

[4 of 46 positions shown; findings below may reference images not displayed]

FINDINGS: Atelectasis in the lung bases.  Small bilateral pleural effusions.

The unenhanced appearance of the liver, spleen, gallbladder,
pancreas, adrenal glands, abdominal aorta, and inferior vena cava is
unremarkable. Native kidneys are atrophic bilaterally without
evidence of hydronephrosis. Scattered lymph nodes in the
retroperitoneum are likely reactive. Stomach, small bowel, and colon
are not abnormally distended. Contrast material flows through to the
distal colon without evidence of small bowel or colonic obstruction.
No free air or free fluid in the abdomen.

Pelvis: Right pelvic transplant kidney. Transplanted kidney appears
to be enlarged and there is prominent perinephric stranding around
the kidney likely to represent infection or edema. Infarction or
renal vein thrombosis cannot be excluded on noncontrast CT. No
evidence of hydronephrosis or stone. Visualized portion of the
transplanted ureter is decompressed and no ureteral stones are
demonstrated. Small amount of free fluid in the pelvis is likely
reactive. Uterus is anteverted and is not enlarged. No abnormal
adnexal masses. Bladder is decompressed. No destructive bone
lesions. Benign-appearing sclerosis in the left acetabulum.
IMPRESSION: Right pelvic transplanted kidney is enlarged with perirenal
infiltration suggesting edema or infection. Probable reactive
retroperitoneal lymph nodes. No evidence of obstruction. Renal
artery stenosis or venous thrombosis cannot be excluded without
contrast material.

## 2015-02-03 MED ORDER — IOHEXOL 300 MG/ML  SOLN
25.0000 mL | INTRAMUSCULAR | Status: AC
  Administered 2015-02-03: 25 mL via ORAL

## 2015-02-03 MED ORDER — METHYLPREDNISOLONE SODIUM SUCC 125 MG IJ SOLR
125.0000 mg | Freq: Once | INTRAMUSCULAR | Status: AC
  Administered 2015-02-04: 125 mg via INTRAVENOUS
  Filled 2015-02-03: qty 2

## 2015-02-03 MED ORDER — DEXTROSE 5 % IV SOLN
1.0000 g | Freq: Once | INTRAVENOUS | Status: AC
  Administered 2015-02-03: 1 g via INTRAVENOUS
  Filled 2015-02-03: qty 10

## 2015-02-03 MED ORDER — ONDANSETRON HCL 4 MG/2ML IJ SOLN
4.0000 mg | Freq: Once | INTRAMUSCULAR | Status: AC
  Administered 2015-02-03: 4 mg via INTRAVENOUS
  Filled 2015-02-03: qty 2

## 2015-02-03 MED ORDER — FENTANYL CITRATE (PF) 100 MCG/2ML IJ SOLN
50.0000 ug | Freq: Once | INTRAMUSCULAR | Status: AC
  Administered 2015-02-03: 50 ug via INTRAVENOUS
  Filled 2015-02-03: qty 2

## 2015-02-03 NOTE — ED Notes (Signed)
The pt is c/o rt abd pain.  She has a transplanted kidney that is not working prioperly  She has been back on hemodialysis since jan.  She does make urine but not as frequent  fior the past 10 days.  Her pain uis over the transplanted kidney.  No energy she feels achey all over .  lmp jun 1st

## 2015-02-03 NOTE — ED Notes (Signed)
She stopped taking  Her anti rejection drugs apporox one month ago

## 2015-02-03 NOTE — ED Provider Notes (Addendum)
CSN: [id number]     Arrival date & time 02/03/15  1617 History   First MD Initiated Contact with Patient 02/03/15 1751     Chief Complaint  Patient presents with  . Abdominal Pain     (Consider location/radiation/quality/duration/timing/severity/associated sxs/prior Treatment) Patient is a 39 y.o. female presenting with abdominal pain. The history is provided by the patient.  Abdominal Pain Pain location:  RLQ Pain quality: aching   Pain radiates to:  Does not radiate Pain severity:  Moderate Onset quality:  Gradual Duration:  3 days Timing:  Intermittent Progression:  Unchanged Chronicity:  New Context comment:  Txplt failure Relieved by:  Nothing Worsened by:  Nothing tried Ineffective treatments:  None tried Associated symptoms: chest pain, chills, diarrhea, fatigue and shortness of breath   Associated symptoms: no cough, no dysuria, no fever, no hematuria, no nausea and no vomiting   Chest pain:    Chest pain quality: tightness.   Severity:  Mild   Onset quality: intermittent.   Past Medical History  Diagnosis Date  . Dialysis patient   . Hypertension   . History of renal transplant Oct 21, 2011  . GERD (gastroesophageal reflux disease)    Past Surgical History  Procedure Laterality Date  . Diaylsis shunt    . Kidney transplant  10/21/2011    deceased donor kidney   Family History  Problem Relation Age of Onset  . Diabetes Father   . Diabetes Paternal Aunt   . Alcohol abuse Paternal Uncle   . Cancer Maternal Grandmother     breast  . Diabetes Paternal Grandmother   . Alcohol abuse Paternal Grandmother   . Alcohol abuse Paternal Grandfather    History  Substance Use Topics  . Smoking status: Never Smoker   . Smokeless tobacco: Never Used  . Alcohol Use: No   OB History    Gravida Para Term Preterm AB TAB SAB Ectopic Multiple Living   3 3        3      Review of Systems  Constitutional: Positive for chills, appetite change and fatigue. Negative for fever.   HENT: Negative for congestion and drooling.   Eyes: Negative for pain.  Respiratory: Positive for chest tightness and shortness of breath. Negative for cough.   Cardiovascular: Positive for chest pain.  Gastrointestinal: Positive for abdominal pain and diarrhea. Negative for nausea and vomiting.  Genitourinary: Negative for dysuria and hematuria.  Musculoskeletal: Negative for back pain, gait problem and neck pain.  Skin: Negative for color change.  Neurological: Negative for dizziness and headaches.  Hematological: Negative for adenopathy.  Psychiatric/Behavioral: Negative for behavioral problems.  All other systems reviewed and are negative.     Allergies  Review of patient's allergies indicates no known allergies.  Home Medications   Prior to Admission medications   Medication Sig Start Date End Date Taking? Authorizing Provider  calcitRIOL (ROCALTROL) 0.25 MCG capsule Take 1 capsule (0.25 mcg total) by mouth every other day. 09/20/14   Virginia Crews, MD  Darbepoetin Alfa (ARANESP) 200 MCG/0.4ML SOSY injection Inject 0.4 mLs (200 mcg total) into the vein every Tuesday with hemodialysis. 09/20/14   Virginia Crews, MD  methylPREDNISolone sodium succinate 500 mg in sodium chloride 0.9 % 50 mL Inject 500 mg into the vein daily. 09/20/14   Virginia Crews, MD  multivitamin (RENA-VIT) TABS tablet Take 1 tablet by mouth at bedtime. 09/20/14   Virginia Crews, MD  mycophenolate (MYFORTIC) 180 MG EC tablet Take 360 mg by  mouth 2 (two) times daily.     Historical Provider, MD  omeprazole (PRILOSEC) 20 MG capsule Take 20 mg by mouth daily.  04/05/12   Historical Provider, MD  ondansetron (ZOFRAN ODT) 8 MG disintegrating tablet Take 1 tablet (8 mg total) by mouth every 8 (eight) hours as needed for nausea. 03/14/14   Varney Biles, MD  predniSONE (DELTASONE) 5 MG tablet Take 10 mg by mouth daily.     Historical Provider, MD  sucroferric oxyhydroxide (VELPHORO) 500 MG chewable  tablet Chew 1 tablet (500 mg total) by mouth 3 (three) times daily with meals. 09/20/14   Virginia Crews, MD  sulfamethoxazole-trimethoprim (BACTRIM,SEPTRA) 400-80 MG per tablet Take 1 tablet by mouth See admin instructions. Monday Wednesday Friday 05/31/12   Historical Provider, MD  tacrolimus (PROGRAF) 0.5 MG capsule Take 0.5 mg by mouth 2 (two) times daily. Total of 1.5mg  BID    Historical Provider, MD  tacrolimus (PROGRAF) 1 MG capsule Take 1 mg by mouth 2 (two) times daily. Total of 1.5mg  BID    Historical Provider, MD  traMADol (ULTRAM) 50 MG tablet Take 1 tablet (50 mg total) by mouth every 6 (six) hours as needed. Patient taking differently: Take 50 mg by mouth every 6 (six) hours as needed for moderate pain.  03/14/14   Ankit Nanavati, MD   BP 120/92 mmHg  Pulse 91  Temp(Src) 98.7 F (37.1 C) (Oral)  Resp 18  Ht 5\' 7"  (1.702 m)  Wt 139 lb 8.8 oz (63.3 kg)  BMI 21.85 kg/m2  SpO2 99%  LMP 01/26/2015 Physical Exam  Constitutional: She is oriented to person, place, and time. She appears well-developed and well-nourished.  HENT:  Head: Normocephalic.  Mouth/Throat: Oropharynx is clear and moist. No oropharyngeal exudate.  Eyes: Conjunctivae and EOM are normal. Pupils are equal, round, and reactive to light.  Neck: Normal range of motion. Neck supple.  Cardiovascular: Normal rate, regular rhythm, normal heart sounds and intact distal pulses.  Exam reveals no gallop and no friction rub.   No murmur heard. Pulmonary/Chest: Effort normal and breath sounds normal. No respiratory distress. She has no wheezes.  Abdominal: Soft. Bowel sounds are normal. There is tenderness (mild tenderness to palpation over the right lower quadrant.). There is no rebound and no guarding.  Musculoskeletal: Normal range of motion. She exhibits no edema or tenderness.  Neurological: She is alert and oriented to person, place, and time.  Skin: Skin is warm and dry.  Psychiatric: She has a normal mood and  affect. Her behavior is normal.  Nursing note and vitals reviewed.   ED Course  Procedures (including critical care time) Labs Review Labs Reviewed  CBC WITH DIFFERENTIAL/PLATELET - Abnormal; Notable for the following:    RBC 3.47 (*)    Hemoglobin 9.8 (*)    HCT 31.5 (*)    Lymphocytes Relative 10 (*)    Eosinophils Relative 20 (*)    Eosinophils Absolute 1.4 (*)    All other components within normal limits  COMPREHENSIVE METABOLIC PANEL - Abnormal; Notable for the following:    Sodium 133 (*)    Chloride 91 (*)    BUN 39 (*)    Creatinine, Ser 17.24 (*)    Calcium 8.7 (*)    Albumin 3.0 (*)    GFR calc non Af Amer 2 (*)    GFR calc Af Amer 3 (*)    All other components within normal limits  URINALYSIS, ROUTINE W REFLEX MICROSCOPIC (NOT AT [facility]) -  Abnormal; Notable for the following:    APPearance TURBID (*)    Hgb urine dipstick SMALL (*)    Protein, ur >300 (*)    Leukocytes, UA MODERATE (*)    All other components within normal limits  URINE MICROSCOPIC-ADD ON - Abnormal; Notable for the following:    Squamous Epithelial / LPF FEW (*)    Bacteria, UA FEW (*)    All other components within normal limits  URINE CULTURE  LIPASE, BLOOD  POC URINE PREG, ED  I-STAT TROPOININ, ED    Imaging Review Ct Abdomen Pelvis Wo Contrast  02/03/2015   CLINICAL DATA:  Right-sided abdominal pain. History of transplant kidney not working properly. Back on hemodialysis since January. Decreasing frequency of urine. Pain over the transplant kidney. Shortness of breath.  EXAM: CT ABDOMEN AND PELVIS WITHOUT CONTRAST  TECHNIQUE: Multidetector CT imaging of the abdomen and pelvis was performed following the standard protocol without IV contrast.  COMPARISON:  Ultrasound kidneys 09/18/2014.  FINDINGS: Atelectasis in the lung bases.  Small bilateral pleural effusions.  The unenhanced appearance of the liver, spleen, gallbladder, pancreas, adrenal glands, abdominal aorta, and inferior vena cava is  unremarkable. Native kidneys are atrophic bilaterally without evidence of hydronephrosis. Scattered lymph nodes in the retroperitoneum are likely reactive. Stomach, small bowel, and colon are not abnormally distended. Contrast material flows through to the distal colon without evidence of small bowel or colonic obstruction. No free air or free fluid in the abdomen.  Pelvis: Right pelvic transplant kidney. Transplanted kidney appears to be enlarged and there is prominent perinephric stranding around the kidney likely to represent infection or edema. Infarction or renal vein thrombosis cannot be excluded on noncontrast CT. No evidence of hydronephrosis or stone. Visualized portion of the transplanted ureter is decompressed and no ureteral stones are demonstrated. Small amount of free fluid in the pelvis is likely reactive. Uterus is anteverted and is not enlarged. No abnormal adnexal masses. Bladder is decompressed. No destructive bone lesions. Benign-appearing sclerosis in the left acetabulum.  IMPRESSION: Right pelvic transplanted kidney is enlarged with perirenal infiltration suggesting edema or infection. Probable reactive retroperitoneal lymph nodes. No evidence of obstruction. Renal artery stenosis or venous thrombosis cannot be excluded without contrast material.   Electronically Signed   By: Lucienne Capers M.D.   On: 02/03/2015 22:27   Dg Chest 2 View  02/03/2015   CLINICAL DATA:  Initial evaluation for acute shortness of breath  EXAM: CHEST  2 VIEW  COMPARISON:  Prior study from 09/25/2008  FINDINGS: The cardiac and mediastinal silhouettes are stable in size and contour, and remain within normal limits.  The lungs are normally inflated. No airspace consolidation, pleural effusion, or pulmonary edema is identified. There is no pneumothorax.  No acute osseous abnormality identified.  IMPRESSION: No active cardiopulmonary disease.   Electronically Signed   By: Jeannine Boga M.D.   On: 02/03/2015  19:29     EKG Interpretation   Date/Time:  Sunday February 03 2015 18:41:11 EDT Ventricular Rate:  71 PR Interval:  163 QRS Duration: 76 QT Interval:  397 QTC Calculation: 431 R Axis:   18 Text Interpretation:  Sinus rhythm non-spec ST changes Confirmed by  Stephanie Littman  MD, Anis Degidio ([id number]) on 02/03/2015 6:43:33 PM      MDM   Final diagnoses:  SOB (shortness of breath)  RLQ abdominal pain  UTI (lower urinary tract infection)    6:36 PM 39 y.o. female w hx of kidney txplt ([facility] 2 yrs ago)  now back on HD (MWF) since Jan '16 who presents with multiple complaints. She states that she has had body aches for the last 7-10 days which seemed to resolve about 2 days ago. She states in the last 2-3 days she has developed some intermittent pain over her kidney transplant as well as some chest tightness and some mild shortness of breath. She denies both of these symptoms at this time. She states that she's been following with her nephrologist and [facility] for the last few months. She states that her transplant has been failing and she had weaned off of her immunosuppressants and states that she completely discontinued them about 2-3 weeks ago. She denies any fevers, or vomiting. She did have a few episodes of diarrhea last week. She states that she feels some mild discomfort after urination but not during. Vital signs unremarkable here. Wells/Perc neg. we'll get screening lab work, urinalysis, and CT scan of abdomen.  Last HD was M and W of this week, she did not go on Friday. Has not had HD in 3 days.   UA suspicious for UTI. Will give Rocephin here.  12:20 AM Case discussed w/ Dr. Linward Foster (txplt at [facility]) and Dr. Florene Glen (Nephro) on call here. Dr. Linward Foster states that CT findings consistent with known transplant rejection and this is not unusual. Also not unusual to have pain in the site of the txplt and fatigue. Dr. Florene Glen with nephrology also agrees. Both recommend steroids. The patient continues to  appear well. Both physicians I spoke with felt like discharge from the ER was reasonable per my discretion. She will need close follow-up with her nephrologist.  12:21 AM:  I have discussed the diagnosis/risks/treatment options with the patient and believe the pt to be eligible for discharge home to follow-up with Dr. Jimmy Footman. We also discussed returning to the ED immediately if new or worsening sx occur. We discussed the sx which are most concerning (e.g., worsening pain, fever, vomiting) that necessitate immediate return. Medications administered to the patient during their visit and any new prescriptions provided to the patient are listed below.  Medications given during this visit Medications  iohexol (OMNIPAQUE) 300 MG/ML solution 25 mL (25 mLs Oral Contrast Given 02/03/15 2015)  cefTRIAXone (ROCEPHIN) 1 g in dextrose 5 % 50 mL IVPB (0 g Intravenous Stopped 02/03/15 2046)  fentaNYL (SUBLIMAZE) injection 50 mcg (50 mcg Intravenous Given 02/03/15 2146)  ondansetron (ZOFRAN) injection 4 mg (4 mg Intravenous Given 02/03/15 2146)  methylPREDNISolone sodium succinate (SOLU-MEDROL) 125 mg/2 mL injection 125 mg (125 mg Intravenous Given 02/04/15 0011)    New Prescriptions   CEPHALEXIN (KEFLEX) 500 MG CAPSULE    Take 1 capsule (500 mg total) by mouth 3 (three) times daily.   OXYCODONE-ACETAMINOPHEN (PERCOCET) 5-325 MG PER TABLET    Take 1 tablet by mouth every 6 (six) hours as needed for severe pain.   PREDNISONE (DELTASONE) 10 MG TABLET    Take 3 tablets or 30 mg by mouth daily for 7 days.     Pamella Pert, MD 02/04/15 Benancio Deeds  Pamella Pert, MD 02/04/15 [phone]

## 2015-02-04 DIAGNOSIS — D631 Anemia in chronic kidney disease: Secondary | ICD-10-CM | POA: Diagnosis not present

## 2015-02-04 DIAGNOSIS — N186 End stage renal disease: Secondary | ICD-10-CM | POA: Diagnosis not present

## 2015-02-04 DIAGNOSIS — D509 Iron deficiency anemia, unspecified: Secondary | ICD-10-CM | POA: Diagnosis not present

## 2015-02-04 DIAGNOSIS — N2581 Secondary hyperparathyroidism of renal origin: Secondary | ICD-10-CM | POA: Diagnosis not present

## 2015-02-04 LAB — URINE CULTURE
Colony Count: NO GROWTH
Culture: NO GROWTH

## 2015-02-04 MED ORDER — CEPHALEXIN 500 MG PO CAPS: 500.0000 mg | ORAL_CAPSULE | Freq: Three times a day (TID) | ORAL | Status: DC

## 2015-02-04 MED ORDER — OXYCODONE-ACETAMINOPHEN 5-325 MG PO TABS: 1.0000 | ORAL_TABLET | Freq: Four times a day (QID) | ORAL | Status: DC | PRN

## 2015-02-04 MED ORDER — PREDNISONE 10 MG PO TABS: ORAL_TABLET | ORAL | Status: DC

## 2015-02-04 NOTE — ED Notes (Signed)
Patient transported to CT 

## 2015-02-06 DIAGNOSIS — D509 Iron deficiency anemia, unspecified: Secondary | ICD-10-CM | POA: Diagnosis not present

## 2015-02-06 DIAGNOSIS — N2581 Secondary hyperparathyroidism of renal origin: Secondary | ICD-10-CM | POA: Diagnosis not present

## 2015-02-06 DIAGNOSIS — D631 Anemia in chronic kidney disease: Secondary | ICD-10-CM | POA: Diagnosis not present

## 2015-02-06 DIAGNOSIS — N186 End stage renal disease: Secondary | ICD-10-CM | POA: Diagnosis not present

## 2015-02-09 DIAGNOSIS — N186 End stage renal disease: Secondary | ICD-10-CM | POA: Diagnosis not present

## 2015-02-09 DIAGNOSIS — D631 Anemia in chronic kidney disease: Secondary | ICD-10-CM | POA: Diagnosis not present

## 2015-02-09 DIAGNOSIS — N2581 Secondary hyperparathyroidism of renal origin: Secondary | ICD-10-CM | POA: Diagnosis not present

## 2015-02-09 DIAGNOSIS — D509 Iron deficiency anemia, unspecified: Secondary | ICD-10-CM | POA: Diagnosis not present

## 2015-02-11 DIAGNOSIS — D509 Iron deficiency anemia, unspecified: Secondary | ICD-10-CM | POA: Diagnosis not present

## 2015-02-11 DIAGNOSIS — D631 Anemia in chronic kidney disease: Secondary | ICD-10-CM | POA: Diagnosis not present

## 2015-02-11 DIAGNOSIS — N186 End stage renal disease: Secondary | ICD-10-CM | POA: Diagnosis not present

## 2015-02-11 DIAGNOSIS — N2581 Secondary hyperparathyroidism of renal origin: Secondary | ICD-10-CM | POA: Diagnosis not present

## 2015-02-13 DIAGNOSIS — N186 End stage renal disease: Secondary | ICD-10-CM | POA: Diagnosis not present

## 2015-02-13 DIAGNOSIS — D631 Anemia in chronic kidney disease: Secondary | ICD-10-CM | POA: Diagnosis not present

## 2015-02-13 DIAGNOSIS — D509 Iron deficiency anemia, unspecified: Secondary | ICD-10-CM | POA: Diagnosis not present

## 2015-02-13 DIAGNOSIS — N2581 Secondary hyperparathyroidism of renal origin: Secondary | ICD-10-CM | POA: Diagnosis not present

## 2015-02-15 DIAGNOSIS — D631 Anemia in chronic kidney disease: Secondary | ICD-10-CM | POA: Diagnosis not present

## 2015-02-15 DIAGNOSIS — N2581 Secondary hyperparathyroidism of renal origin: Secondary | ICD-10-CM | POA: Diagnosis not present

## 2015-02-15 DIAGNOSIS — N186 End stage renal disease: Secondary | ICD-10-CM | POA: Diagnosis not present

## 2015-02-15 DIAGNOSIS — D509 Iron deficiency anemia, unspecified: Secondary | ICD-10-CM | POA: Diagnosis not present

## 2015-02-18 DIAGNOSIS — D509 Iron deficiency anemia, unspecified: Secondary | ICD-10-CM | POA: Diagnosis not present

## 2015-02-18 DIAGNOSIS — D631 Anemia in chronic kidney disease: Secondary | ICD-10-CM | POA: Diagnosis not present

## 2015-02-18 DIAGNOSIS — N186 End stage renal disease: Secondary | ICD-10-CM | POA: Diagnosis not present

## 2015-02-18 DIAGNOSIS — N2581 Secondary hyperparathyroidism of renal origin: Secondary | ICD-10-CM | POA: Diagnosis not present

## 2015-02-20 DIAGNOSIS — D509 Iron deficiency anemia, unspecified: Secondary | ICD-10-CM | POA: Diagnosis not present

## 2015-02-20 DIAGNOSIS — N2581 Secondary hyperparathyroidism of renal origin: Secondary | ICD-10-CM | POA: Diagnosis not present

## 2015-02-20 DIAGNOSIS — D631 Anemia in chronic kidney disease: Secondary | ICD-10-CM | POA: Diagnosis not present

## 2015-02-20 DIAGNOSIS — N186 End stage renal disease: Secondary | ICD-10-CM | POA: Diagnosis not present

## 2015-02-25 DIAGNOSIS — D631 Anemia in chronic kidney disease: Secondary | ICD-10-CM | POA: Diagnosis not present

## 2015-02-25 DIAGNOSIS — D509 Iron deficiency anemia, unspecified: Secondary | ICD-10-CM | POA: Diagnosis not present

## 2015-02-25 DIAGNOSIS — N2581 Secondary hyperparathyroidism of renal origin: Secondary | ICD-10-CM | POA: Diagnosis not present

## 2015-02-25 DIAGNOSIS — N186 End stage renal disease: Secondary | ICD-10-CM | POA: Diagnosis not present

## 2015-02-27 DIAGNOSIS — D631 Anemia in chronic kidney disease: Secondary | ICD-10-CM | POA: Diagnosis not present

## 2015-02-27 DIAGNOSIS — N2581 Secondary hyperparathyroidism of renal origin: Secondary | ICD-10-CM | POA: Diagnosis not present

## 2015-02-27 DIAGNOSIS — N186 End stage renal disease: Secondary | ICD-10-CM | POA: Diagnosis not present

## 2015-02-27 DIAGNOSIS — D509 Iron deficiency anemia, unspecified: Secondary | ICD-10-CM | POA: Diagnosis not present

## 2015-03-04 DIAGNOSIS — N186 End stage renal disease: Secondary | ICD-10-CM | POA: Diagnosis not present

## 2015-03-04 DIAGNOSIS — D509 Iron deficiency anemia, unspecified: Secondary | ICD-10-CM | POA: Diagnosis not present

## 2015-03-04 DIAGNOSIS — D631 Anemia in chronic kidney disease: Secondary | ICD-10-CM | POA: Diagnosis not present

## 2015-03-04 DIAGNOSIS — N2581 Secondary hyperparathyroidism of renal origin: Secondary | ICD-10-CM | POA: Diagnosis not present

## 2015-03-08 DIAGNOSIS — N186 End stage renal disease: Secondary | ICD-10-CM | POA: Diagnosis not present

## 2015-03-08 DIAGNOSIS — D631 Anemia in chronic kidney disease: Secondary | ICD-10-CM | POA: Diagnosis not present

## 2015-03-08 DIAGNOSIS — D509 Iron deficiency anemia, unspecified: Secondary | ICD-10-CM | POA: Diagnosis not present

## 2015-03-08 DIAGNOSIS — N2581 Secondary hyperparathyroidism of renal origin: Secondary | ICD-10-CM | POA: Diagnosis not present

## 2015-03-11 DIAGNOSIS — N2581 Secondary hyperparathyroidism of renal origin: Secondary | ICD-10-CM | POA: Diagnosis not present

## 2015-03-11 DIAGNOSIS — D631 Anemia in chronic kidney disease: Secondary | ICD-10-CM | POA: Diagnosis not present

## 2015-03-11 DIAGNOSIS — N186 End stage renal disease: Secondary | ICD-10-CM | POA: Diagnosis not present

## 2015-03-11 DIAGNOSIS — D509 Iron deficiency anemia, unspecified: Secondary | ICD-10-CM | POA: Diagnosis not present

## 2015-03-13 DIAGNOSIS — N2581 Secondary hyperparathyroidism of renal origin: Secondary | ICD-10-CM | POA: Diagnosis not present

## 2015-03-13 DIAGNOSIS — D509 Iron deficiency anemia, unspecified: Secondary | ICD-10-CM | POA: Diagnosis not present

## 2015-03-13 DIAGNOSIS — N186 End stage renal disease: Secondary | ICD-10-CM | POA: Diagnosis not present

## 2015-03-13 DIAGNOSIS — D631 Anemia in chronic kidney disease: Secondary | ICD-10-CM | POA: Diagnosis not present

## 2015-03-18 DIAGNOSIS — D509 Iron deficiency anemia, unspecified: Secondary | ICD-10-CM | POA: Diagnosis not present

## 2015-03-18 DIAGNOSIS — N186 End stage renal disease: Secondary | ICD-10-CM | POA: Diagnosis not present

## 2015-03-18 DIAGNOSIS — N2581 Secondary hyperparathyroidism of renal origin: Secondary | ICD-10-CM | POA: Diagnosis not present

## 2015-03-18 DIAGNOSIS — D631 Anemia in chronic kidney disease: Secondary | ICD-10-CM | POA: Diagnosis not present

## 2015-03-20 DIAGNOSIS — D509 Iron deficiency anemia, unspecified: Secondary | ICD-10-CM | POA: Diagnosis not present

## 2015-03-20 DIAGNOSIS — N186 End stage renal disease: Secondary | ICD-10-CM | POA: Diagnosis not present

## 2015-03-20 DIAGNOSIS — N2581 Secondary hyperparathyroidism of renal origin: Secondary | ICD-10-CM | POA: Diagnosis not present

## 2015-03-20 DIAGNOSIS — D631 Anemia in chronic kidney disease: Secondary | ICD-10-CM | POA: Diagnosis not present

## 2015-03-22 DIAGNOSIS — D631 Anemia in chronic kidney disease: Secondary | ICD-10-CM | POA: Diagnosis not present

## 2015-03-22 DIAGNOSIS — D509 Iron deficiency anemia, unspecified: Secondary | ICD-10-CM | POA: Diagnosis not present

## 2015-03-22 DIAGNOSIS — N186 End stage renal disease: Secondary | ICD-10-CM | POA: Diagnosis not present

## 2015-03-22 DIAGNOSIS — N2581 Secondary hyperparathyroidism of renal origin: Secondary | ICD-10-CM | POA: Diagnosis not present

## 2015-03-27 DIAGNOSIS — D631 Anemia in chronic kidney disease: Secondary | ICD-10-CM | POA: Diagnosis not present

## 2015-03-27 DIAGNOSIS — D509 Iron deficiency anemia, unspecified: Secondary | ICD-10-CM | POA: Diagnosis not present

## 2015-03-27 DIAGNOSIS — N186 End stage renal disease: Secondary | ICD-10-CM | POA: Diagnosis not present

## 2015-03-27 DIAGNOSIS — N2581 Secondary hyperparathyroidism of renal origin: Secondary | ICD-10-CM | POA: Diagnosis not present

## 2015-03-29 DIAGNOSIS — N2581 Secondary hyperparathyroidism of renal origin: Secondary | ICD-10-CM | POA: Diagnosis not present

## 2015-03-29 DIAGNOSIS — N186 End stage renal disease: Secondary | ICD-10-CM | POA: Diagnosis not present

## 2015-03-29 DIAGNOSIS — D631 Anemia in chronic kidney disease: Secondary | ICD-10-CM | POA: Diagnosis not present

## 2015-03-29 DIAGNOSIS — D509 Iron deficiency anemia, unspecified: Secondary | ICD-10-CM | POA: Diagnosis not present

## 2015-04-01 DIAGNOSIS — N186 End stage renal disease: Secondary | ICD-10-CM | POA: Diagnosis not present

## 2015-04-01 DIAGNOSIS — D631 Anemia in chronic kidney disease: Secondary | ICD-10-CM | POA: Diagnosis not present

## 2015-04-01 DIAGNOSIS — N2581 Secondary hyperparathyroidism of renal origin: Secondary | ICD-10-CM | POA: Diagnosis not present

## 2015-04-01 DIAGNOSIS — D509 Iron deficiency anemia, unspecified: Secondary | ICD-10-CM | POA: Diagnosis not present

## 2015-04-03 DIAGNOSIS — N2581 Secondary hyperparathyroidism of renal origin: Secondary | ICD-10-CM | POA: Diagnosis not present

## 2015-04-03 DIAGNOSIS — D509 Iron deficiency anemia, unspecified: Secondary | ICD-10-CM | POA: Diagnosis not present

## 2015-04-03 DIAGNOSIS — N186 End stage renal disease: Secondary | ICD-10-CM | POA: Diagnosis not present

## 2015-04-03 DIAGNOSIS — D631 Anemia in chronic kidney disease: Secondary | ICD-10-CM | POA: Diagnosis not present

## 2015-04-05 DIAGNOSIS — D631 Anemia in chronic kidney disease: Secondary | ICD-10-CM | POA: Diagnosis not present

## 2015-04-05 DIAGNOSIS — D509 Iron deficiency anemia, unspecified: Secondary | ICD-10-CM | POA: Diagnosis not present

## 2015-04-05 DIAGNOSIS — N2581 Secondary hyperparathyroidism of renal origin: Secondary | ICD-10-CM | POA: Diagnosis not present

## 2015-04-05 DIAGNOSIS — N186 End stage renal disease: Secondary | ICD-10-CM | POA: Diagnosis not present

## 2015-04-10 DIAGNOSIS — D509 Iron deficiency anemia, unspecified: Secondary | ICD-10-CM | POA: Diagnosis not present

## 2015-04-10 DIAGNOSIS — N2581 Secondary hyperparathyroidism of renal origin: Secondary | ICD-10-CM | POA: Diagnosis not present

## 2015-04-10 DIAGNOSIS — D631 Anemia in chronic kidney disease: Secondary | ICD-10-CM | POA: Diagnosis not present

## 2015-04-10 DIAGNOSIS — N186 End stage renal disease: Secondary | ICD-10-CM | POA: Diagnosis not present

## 2015-04-15 DIAGNOSIS — D631 Anemia in chronic kidney disease: Secondary | ICD-10-CM | POA: Diagnosis not present

## 2015-04-15 DIAGNOSIS — N186 End stage renal disease: Secondary | ICD-10-CM | POA: Diagnosis not present

## 2015-04-15 DIAGNOSIS — N2581 Secondary hyperparathyroidism of renal origin: Secondary | ICD-10-CM | POA: Diagnosis not present

## 2015-04-15 DIAGNOSIS — D509 Iron deficiency anemia, unspecified: Secondary | ICD-10-CM | POA: Diagnosis not present

## 2015-04-17 DIAGNOSIS — D631 Anemia in chronic kidney disease: Secondary | ICD-10-CM | POA: Diagnosis not present

## 2015-04-17 DIAGNOSIS — D509 Iron deficiency anemia, unspecified: Secondary | ICD-10-CM | POA: Diagnosis not present

## 2015-04-17 DIAGNOSIS — N2581 Secondary hyperparathyroidism of renal origin: Secondary | ICD-10-CM | POA: Diagnosis not present

## 2015-04-17 DIAGNOSIS — N186 End stage renal disease: Secondary | ICD-10-CM | POA: Diagnosis not present

## 2015-04-19 DIAGNOSIS — N2581 Secondary hyperparathyroidism of renal origin: Secondary | ICD-10-CM | POA: Diagnosis not present

## 2015-04-19 DIAGNOSIS — N186 End stage renal disease: Secondary | ICD-10-CM | POA: Diagnosis not present

## 2015-04-19 DIAGNOSIS — D509 Iron deficiency anemia, unspecified: Secondary | ICD-10-CM | POA: Diagnosis not present

## 2015-04-19 DIAGNOSIS — D631 Anemia in chronic kidney disease: Secondary | ICD-10-CM | POA: Diagnosis not present

## 2015-04-22 DIAGNOSIS — N186 End stage renal disease: Secondary | ICD-10-CM | POA: Diagnosis not present

## 2015-04-22 DIAGNOSIS — D509 Iron deficiency anemia, unspecified: Secondary | ICD-10-CM | POA: Diagnosis not present

## 2015-04-22 DIAGNOSIS — N2581 Secondary hyperparathyroidism of renal origin: Secondary | ICD-10-CM | POA: Diagnosis not present

## 2015-04-22 DIAGNOSIS — D631 Anemia in chronic kidney disease: Secondary | ICD-10-CM | POA: Diagnosis not present

## 2015-04-24 DIAGNOSIS — D509 Iron deficiency anemia, unspecified: Secondary | ICD-10-CM | POA: Diagnosis not present

## 2015-04-24 DIAGNOSIS — D631 Anemia in chronic kidney disease: Secondary | ICD-10-CM | POA: Diagnosis not present

## 2015-04-24 DIAGNOSIS — N186 End stage renal disease: Secondary | ICD-10-CM | POA: Diagnosis not present

## 2015-04-24 DIAGNOSIS — N2581 Secondary hyperparathyroidism of renal origin: Secondary | ICD-10-CM | POA: Diagnosis not present

## 2015-04-26 DIAGNOSIS — D509 Iron deficiency anemia, unspecified: Secondary | ICD-10-CM | POA: Diagnosis not present

## 2015-04-26 DIAGNOSIS — N2581 Secondary hyperparathyroidism of renal origin: Secondary | ICD-10-CM | POA: Diagnosis not present

## 2015-04-26 DIAGNOSIS — N186 End stage renal disease: Secondary | ICD-10-CM | POA: Diagnosis not present

## 2015-04-26 DIAGNOSIS — D631 Anemia in chronic kidney disease: Secondary | ICD-10-CM | POA: Diagnosis not present

## 2015-04-29 DIAGNOSIS — N2581 Secondary hyperparathyroidism of renal origin: Secondary | ICD-10-CM | POA: Diagnosis not present

## 2015-04-29 DIAGNOSIS — N186 End stage renal disease: Secondary | ICD-10-CM | POA: Diagnosis not present

## 2015-04-29 DIAGNOSIS — D631 Anemia in chronic kidney disease: Secondary | ICD-10-CM | POA: Diagnosis not present

## 2015-04-29 DIAGNOSIS — D509 Iron deficiency anemia, unspecified: Secondary | ICD-10-CM | POA: Diagnosis not present

## 2015-05-03 DIAGNOSIS — N186 End stage renal disease: Secondary | ICD-10-CM | POA: Diagnosis not present

## 2015-05-03 DIAGNOSIS — N2581 Secondary hyperparathyroidism of renal origin: Secondary | ICD-10-CM | POA: Diagnosis not present

## 2015-05-03 DIAGNOSIS — D631 Anemia in chronic kidney disease: Secondary | ICD-10-CM | POA: Diagnosis not present

## 2015-05-03 DIAGNOSIS — D509 Iron deficiency anemia, unspecified: Secondary | ICD-10-CM | POA: Diagnosis not present

## 2015-05-08 DIAGNOSIS — N2581 Secondary hyperparathyroidism of renal origin: Secondary | ICD-10-CM | POA: Diagnosis not present

## 2015-05-08 DIAGNOSIS — D509 Iron deficiency anemia, unspecified: Secondary | ICD-10-CM | POA: Diagnosis not present

## 2015-05-08 DIAGNOSIS — D631 Anemia in chronic kidney disease: Secondary | ICD-10-CM | POA: Diagnosis not present

## 2015-05-08 DIAGNOSIS — N186 End stage renal disease: Secondary | ICD-10-CM | POA: Diagnosis not present

## 2015-05-13 DIAGNOSIS — D631 Anemia in chronic kidney disease: Secondary | ICD-10-CM | POA: Diagnosis not present

## 2015-05-13 DIAGNOSIS — N186 End stage renal disease: Secondary | ICD-10-CM | POA: Diagnosis not present

## 2015-05-13 DIAGNOSIS — N2581 Secondary hyperparathyroidism of renal origin: Secondary | ICD-10-CM | POA: Diagnosis not present

## 2015-05-13 DIAGNOSIS — D509 Iron deficiency anemia, unspecified: Secondary | ICD-10-CM | POA: Diagnosis not present

## 2015-05-15 DIAGNOSIS — N186 End stage renal disease: Secondary | ICD-10-CM | POA: Diagnosis not present

## 2015-05-15 DIAGNOSIS — D509 Iron deficiency anemia, unspecified: Secondary | ICD-10-CM | POA: Diagnosis not present

## 2015-05-15 DIAGNOSIS — D631 Anemia in chronic kidney disease: Secondary | ICD-10-CM | POA: Diagnosis not present

## 2015-05-15 DIAGNOSIS — N2581 Secondary hyperparathyroidism of renal origin: Secondary | ICD-10-CM | POA: Diagnosis not present

## 2015-05-17 DIAGNOSIS — D631 Anemia in chronic kidney disease: Secondary | ICD-10-CM | POA: Diagnosis not present

## 2015-05-17 DIAGNOSIS — N186 End stage renal disease: Secondary | ICD-10-CM | POA: Diagnosis not present

## 2015-05-17 DIAGNOSIS — N2581 Secondary hyperparathyroidism of renal origin: Secondary | ICD-10-CM | POA: Diagnosis not present

## 2015-05-17 DIAGNOSIS — D509 Iron deficiency anemia, unspecified: Secondary | ICD-10-CM | POA: Diagnosis not present

## 2015-05-20 DIAGNOSIS — D509 Iron deficiency anemia, unspecified: Secondary | ICD-10-CM | POA: Diagnosis not present

## 2015-05-20 DIAGNOSIS — N186 End stage renal disease: Secondary | ICD-10-CM | POA: Diagnosis not present

## 2015-05-20 DIAGNOSIS — N2581 Secondary hyperparathyroidism of renal origin: Secondary | ICD-10-CM | POA: Diagnosis not present

## 2015-05-20 DIAGNOSIS — D631 Anemia in chronic kidney disease: Secondary | ICD-10-CM | POA: Diagnosis not present

## 2015-05-22 DIAGNOSIS — N2581 Secondary hyperparathyroidism of renal origin: Secondary | ICD-10-CM | POA: Diagnosis not present

## 2015-05-22 DIAGNOSIS — D631 Anemia in chronic kidney disease: Secondary | ICD-10-CM | POA: Diagnosis not present

## 2015-05-22 DIAGNOSIS — D509 Iron deficiency anemia, unspecified: Secondary | ICD-10-CM | POA: Diagnosis not present

## 2015-05-22 DIAGNOSIS — N186 End stage renal disease: Secondary | ICD-10-CM | POA: Diagnosis not present

## 2015-05-24 DIAGNOSIS — N2581 Secondary hyperparathyroidism of renal origin: Secondary | ICD-10-CM | POA: Diagnosis not present

## 2015-05-24 DIAGNOSIS — N186 End stage renal disease: Secondary | ICD-10-CM | POA: Diagnosis not present

## 2015-05-24 DIAGNOSIS — D631 Anemia in chronic kidney disease: Secondary | ICD-10-CM | POA: Diagnosis not present

## 2015-05-24 DIAGNOSIS — D509 Iron deficiency anemia, unspecified: Secondary | ICD-10-CM | POA: Diagnosis not present

## 2015-05-29 DIAGNOSIS — D509 Iron deficiency anemia, unspecified: Secondary | ICD-10-CM | POA: Diagnosis not present

## 2015-05-29 DIAGNOSIS — N2581 Secondary hyperparathyroidism of renal origin: Secondary | ICD-10-CM | POA: Diagnosis not present

## 2015-05-29 DIAGNOSIS — D631 Anemia in chronic kidney disease: Secondary | ICD-10-CM | POA: Diagnosis not present

## 2015-05-29 DIAGNOSIS — N186 End stage renal disease: Secondary | ICD-10-CM | POA: Diagnosis not present

## 2015-05-31 DIAGNOSIS — D631 Anemia in chronic kidney disease: Secondary | ICD-10-CM | POA: Diagnosis not present

## 2015-05-31 DIAGNOSIS — N186 End stage renal disease: Secondary | ICD-10-CM | POA: Diagnosis not present

## 2015-05-31 DIAGNOSIS — D509 Iron deficiency anemia, unspecified: Secondary | ICD-10-CM | POA: Diagnosis not present

## 2015-05-31 DIAGNOSIS — N2581 Secondary hyperparathyroidism of renal origin: Secondary | ICD-10-CM | POA: Diagnosis not present

## 2015-06-03 DIAGNOSIS — D631 Anemia in chronic kidney disease: Secondary | ICD-10-CM | POA: Diagnosis not present

## 2015-06-03 DIAGNOSIS — N2581 Secondary hyperparathyroidism of renal origin: Secondary | ICD-10-CM | POA: Diagnosis not present

## 2015-06-03 DIAGNOSIS — N186 End stage renal disease: Secondary | ICD-10-CM | POA: Diagnosis not present

## 2015-06-03 DIAGNOSIS — D509 Iron deficiency anemia, unspecified: Secondary | ICD-10-CM | POA: Diagnosis not present

## 2015-06-05 DIAGNOSIS — D631 Anemia in chronic kidney disease: Secondary | ICD-10-CM | POA: Diagnosis not present

## 2015-06-05 DIAGNOSIS — N186 End stage renal disease: Secondary | ICD-10-CM | POA: Diagnosis not present

## 2015-06-05 DIAGNOSIS — N2581 Secondary hyperparathyroidism of renal origin: Secondary | ICD-10-CM | POA: Diagnosis not present

## 2015-06-05 DIAGNOSIS — D509 Iron deficiency anemia, unspecified: Secondary | ICD-10-CM | POA: Diagnosis not present

## 2015-06-07 DIAGNOSIS — D631 Anemia in chronic kidney disease: Secondary | ICD-10-CM | POA: Diagnosis not present

## 2015-06-07 DIAGNOSIS — N186 End stage renal disease: Secondary | ICD-10-CM | POA: Diagnosis not present

## 2015-06-07 DIAGNOSIS — N2581 Secondary hyperparathyroidism of renal origin: Secondary | ICD-10-CM | POA: Diagnosis not present

## 2015-06-07 DIAGNOSIS — D509 Iron deficiency anemia, unspecified: Secondary | ICD-10-CM | POA: Diagnosis not present

## 2015-06-10 DIAGNOSIS — D631 Anemia in chronic kidney disease: Secondary | ICD-10-CM | POA: Diagnosis not present

## 2015-06-10 DIAGNOSIS — D509 Iron deficiency anemia, unspecified: Secondary | ICD-10-CM | POA: Diagnosis not present

## 2015-06-10 DIAGNOSIS — N2581 Secondary hyperparathyroidism of renal origin: Secondary | ICD-10-CM | POA: Diagnosis not present

## 2015-06-10 DIAGNOSIS — N186 End stage renal disease: Secondary | ICD-10-CM | POA: Diagnosis not present

## 2015-06-12 DIAGNOSIS — D509 Iron deficiency anemia, unspecified: Secondary | ICD-10-CM | POA: Diagnosis not present

## 2015-06-12 DIAGNOSIS — N2581 Secondary hyperparathyroidism of renal origin: Secondary | ICD-10-CM | POA: Diagnosis not present

## 2015-06-12 DIAGNOSIS — D631 Anemia in chronic kidney disease: Secondary | ICD-10-CM | POA: Diagnosis not present

## 2015-06-12 DIAGNOSIS — N186 End stage renal disease: Secondary | ICD-10-CM | POA: Diagnosis not present

## 2015-06-13 DIAGNOSIS — D631 Anemia in chronic kidney disease: Secondary | ICD-10-CM | POA: Diagnosis not present

## 2015-06-13 DIAGNOSIS — N186 End stage renal disease: Secondary | ICD-10-CM | POA: Diagnosis not present

## 2015-06-13 DIAGNOSIS — D509 Iron deficiency anemia, unspecified: Secondary | ICD-10-CM | POA: Diagnosis not present

## 2015-06-13 DIAGNOSIS — N2581 Secondary hyperparathyroidism of renal origin: Secondary | ICD-10-CM | POA: Diagnosis not present

## 2015-06-17 DIAGNOSIS — D631 Anemia in chronic kidney disease: Secondary | ICD-10-CM | POA: Diagnosis not present

## 2015-06-17 DIAGNOSIS — D509 Iron deficiency anemia, unspecified: Secondary | ICD-10-CM | POA: Diagnosis not present

## 2015-06-17 DIAGNOSIS — N2581 Secondary hyperparathyroidism of renal origin: Secondary | ICD-10-CM | POA: Diagnosis not present

## 2015-06-17 DIAGNOSIS — N186 End stage renal disease: Secondary | ICD-10-CM | POA: Diagnosis not present

## 2015-06-19 DIAGNOSIS — N186 End stage renal disease: Secondary | ICD-10-CM | POA: Diagnosis not present

## 2015-06-19 DIAGNOSIS — D631 Anemia in chronic kidney disease: Secondary | ICD-10-CM | POA: Diagnosis not present

## 2015-06-19 DIAGNOSIS — D509 Iron deficiency anemia, unspecified: Secondary | ICD-10-CM | POA: Diagnosis not present

## 2015-06-19 DIAGNOSIS — N2581 Secondary hyperparathyroidism of renal origin: Secondary | ICD-10-CM | POA: Diagnosis not present

## 2015-06-21 DIAGNOSIS — D509 Iron deficiency anemia, unspecified: Secondary | ICD-10-CM | POA: Diagnosis not present

## 2015-06-21 DIAGNOSIS — N186 End stage renal disease: Secondary | ICD-10-CM | POA: Diagnosis not present

## 2015-06-21 DIAGNOSIS — N2581 Secondary hyperparathyroidism of renal origin: Secondary | ICD-10-CM | POA: Diagnosis not present

## 2015-06-21 DIAGNOSIS — D631 Anemia in chronic kidney disease: Secondary | ICD-10-CM | POA: Diagnosis not present

## 2015-06-24 DIAGNOSIS — D631 Anemia in chronic kidney disease: Secondary | ICD-10-CM | POA: Diagnosis not present

## 2015-06-24 DIAGNOSIS — N186 End stage renal disease: Secondary | ICD-10-CM | POA: Diagnosis not present

## 2015-06-24 DIAGNOSIS — D509 Iron deficiency anemia, unspecified: Secondary | ICD-10-CM | POA: Diagnosis not present

## 2015-06-24 DIAGNOSIS — N2581 Secondary hyperparathyroidism of renal origin: Secondary | ICD-10-CM | POA: Diagnosis not present

## 2015-06-24 DIAGNOSIS — I129 Hypertensive chronic kidney disease with stage 1 through stage 4 chronic kidney disease, or unspecified chronic kidney disease: Secondary | ICD-10-CM | POA: Diagnosis not present

## 2015-06-24 DIAGNOSIS — Z992 Dependence on renal dialysis: Secondary | ICD-10-CM | POA: Diagnosis not present

## 2015-06-28 DIAGNOSIS — D631 Anemia in chronic kidney disease: Secondary | ICD-10-CM | POA: Diagnosis not present

## 2015-06-28 DIAGNOSIS — N186 End stage renal disease: Secondary | ICD-10-CM | POA: Diagnosis not present

## 2015-06-28 DIAGNOSIS — D509 Iron deficiency anemia, unspecified: Secondary | ICD-10-CM | POA: Diagnosis not present

## 2015-06-28 DIAGNOSIS — N2581 Secondary hyperparathyroidism of renal origin: Secondary | ICD-10-CM | POA: Diagnosis not present

## 2015-07-01 DIAGNOSIS — D631 Anemia in chronic kidney disease: Secondary | ICD-10-CM | POA: Diagnosis not present

## 2015-07-01 DIAGNOSIS — N2581 Secondary hyperparathyroidism of renal origin: Secondary | ICD-10-CM | POA: Diagnosis not present

## 2015-07-01 DIAGNOSIS — D509 Iron deficiency anemia, unspecified: Secondary | ICD-10-CM | POA: Diagnosis not present

## 2015-07-01 DIAGNOSIS — N186 End stage renal disease: Secondary | ICD-10-CM | POA: Diagnosis not present

## 2015-07-02 ENCOUNTER — Ambulatory Visit: Payer: Self-pay | Admitting: Family Medicine

## 2015-07-02 DIAGNOSIS — H04123 Dry eye syndrome of bilateral lacrimal glands: Secondary | ICD-10-CM | POA: Diagnosis not present

## 2015-07-05 DIAGNOSIS — D631 Anemia in chronic kidney disease: Secondary | ICD-10-CM | POA: Diagnosis not present

## 2015-07-05 DIAGNOSIS — N186 End stage renal disease: Secondary | ICD-10-CM | POA: Diagnosis not present

## 2015-07-05 DIAGNOSIS — D509 Iron deficiency anemia, unspecified: Secondary | ICD-10-CM | POA: Diagnosis not present

## 2015-07-05 DIAGNOSIS — N2581 Secondary hyperparathyroidism of renal origin: Secondary | ICD-10-CM | POA: Diagnosis not present

## 2015-07-08 DIAGNOSIS — N186 End stage renal disease: Secondary | ICD-10-CM | POA: Diagnosis not present

## 2015-07-08 DIAGNOSIS — N2581 Secondary hyperparathyroidism of renal origin: Secondary | ICD-10-CM | POA: Diagnosis not present

## 2015-07-08 DIAGNOSIS — D631 Anemia in chronic kidney disease: Secondary | ICD-10-CM | POA: Diagnosis not present

## 2015-07-08 DIAGNOSIS — D509 Iron deficiency anemia, unspecified: Secondary | ICD-10-CM | POA: Diagnosis not present

## 2015-07-10 DIAGNOSIS — N2581 Secondary hyperparathyroidism of renal origin: Secondary | ICD-10-CM | POA: Diagnosis not present

## 2015-07-10 DIAGNOSIS — D631 Anemia in chronic kidney disease: Secondary | ICD-10-CM | POA: Diagnosis not present

## 2015-07-10 DIAGNOSIS — D509 Iron deficiency anemia, unspecified: Secondary | ICD-10-CM | POA: Diagnosis not present

## 2015-07-10 DIAGNOSIS — N186 End stage renal disease: Secondary | ICD-10-CM | POA: Diagnosis not present

## 2015-07-12 DIAGNOSIS — D631 Anemia in chronic kidney disease: Secondary | ICD-10-CM | POA: Diagnosis not present

## 2015-07-12 DIAGNOSIS — N186 End stage renal disease: Secondary | ICD-10-CM | POA: Diagnosis not present

## 2015-07-12 DIAGNOSIS — N2581 Secondary hyperparathyroidism of renal origin: Secondary | ICD-10-CM | POA: Diagnosis not present

## 2015-07-12 DIAGNOSIS — D509 Iron deficiency anemia, unspecified: Secondary | ICD-10-CM | POA: Diagnosis not present

## 2015-07-15 DIAGNOSIS — D509 Iron deficiency anemia, unspecified: Secondary | ICD-10-CM | POA: Diagnosis not present

## 2015-07-15 DIAGNOSIS — D631 Anemia in chronic kidney disease: Secondary | ICD-10-CM | POA: Diagnosis not present

## 2015-07-15 DIAGNOSIS — N2581 Secondary hyperparathyroidism of renal origin: Secondary | ICD-10-CM | POA: Diagnosis not present

## 2015-07-15 DIAGNOSIS — N186 End stage renal disease: Secondary | ICD-10-CM | POA: Diagnosis not present

## 2015-07-18 ENCOUNTER — Emergency Department (HOSPITAL_COMMUNITY)
Admission: EM | Admit: 2015-07-18 | Discharge: 2015-07-18 | Disposition: A | Discharge status: Home / Self Care | Payer: Medicare Other | Attending: Emergency Medicine | Admitting: Emergency Medicine

## 2015-07-18 ENCOUNTER — Encounter (HOSPITAL_COMMUNITY): Payer: Self-pay | Admitting: *Deleted

## 2015-07-18 DIAGNOSIS — R0602 Shortness of breath: Secondary | ICD-10-CM | POA: Insufficient documentation

## 2015-07-18 DIAGNOSIS — N186 End stage renal disease: Secondary | ICD-10-CM | POA: Diagnosis not present

## 2015-07-18 DIAGNOSIS — Z452 Encounter for adjustment and management of vascular access device: Secondary | ICD-10-CM | POA: Diagnosis not present

## 2015-07-18 DIAGNOSIS — Z4931 Encounter for adequacy testing for hemodialysis: Secondary | ICD-10-CM | POA: Diagnosis not present

## 2015-07-18 DIAGNOSIS — Z940 Kidney transplant status: Secondary | ICD-10-CM | POA: Insufficient documentation

## 2015-07-18 DIAGNOSIS — Z992 Dependence on renal dialysis: Secondary | ICD-10-CM

## 2015-07-18 DIAGNOSIS — Z79899 Other long term (current) drug therapy: Secondary | ICD-10-CM | POA: Diagnosis not present

## 2015-07-18 DIAGNOSIS — Z7952 Long term (current) use of systemic steroids: Secondary | ICD-10-CM | POA: Insufficient documentation

## 2015-07-18 DIAGNOSIS — I120 Hypertensive chronic kidney disease with stage 5 chronic kidney disease or end stage renal disease: Secondary | ICD-10-CM | POA: Insufficient documentation

## 2015-07-18 DIAGNOSIS — Z8719 Personal history of other diseases of the digestive system: Secondary | ICD-10-CM | POA: Insufficient documentation

## 2015-07-18 LAB — CBC WITH DIFFERENTIAL/PLATELET
Basophils Absolute: 0 10*3/uL (ref 0.0–0.1)
Basophils Relative: 0 %
Eosinophils Absolute: 0.1 10*3/uL (ref 0.0–0.7)
Eosinophils Relative: 2 %
HCT: 28.7 % — ABNORMAL LOW (ref 36.0–46.0)
Hemoglobin: 8.7 g/dL — ABNORMAL LOW (ref 12.0–15.0)
Lymphocytes Relative: 6 %
Lymphs Abs: 0.5 10*3/uL — ABNORMAL LOW (ref 0.7–4.0)
MCH: 27.3 pg (ref 26.0–34.0)
MCHC: 30.3 g/dL (ref 30.0–36.0)
MCV: 90 fL (ref 78.0–100.0)
Monocytes Absolute: 0.8 10*3/uL (ref 0.1–1.0)
Monocytes Relative: 10 %
Neutro Abs: 6.2 10*3/uL (ref 1.7–7.7)
Neutrophils Relative %: 82 %
Platelets: 235 10*3/uL (ref 150–400)
RBC: 3.19 MIL/uL — ABNORMAL LOW (ref 3.87–5.11)
RDW: 18.1 % — ABNORMAL HIGH (ref 11.5–15.5)
WBC: 7.5 10*3/uL (ref 4.0–10.5)

## 2015-07-18 LAB — COMPREHENSIVE METABOLIC PANEL
ALT: 22 U/L (ref 14–54)
AST: 45 U/L — ABNORMAL HIGH (ref 15–41)
Albumin: 2.8 g/dL — ABNORMAL LOW (ref 3.5–5.0)
Alkaline Phosphatase: 108 U/L (ref 38–126)
Anion gap: 14 (ref 5–15)
BUN: 57 mg/dL — ABNORMAL HIGH (ref 6–20)
CO2: 26 mmol/L (ref 22–32)
Calcium: 9.2 mg/dL (ref 8.9–10.3)
Chloride: 101 mmol/L (ref 101–111)
Creatinine, Ser: 11.61 mg/dL — ABNORMAL HIGH (ref 0.44–1.00)
GFR calc Af Amer: 4 mL/min — ABNORMAL LOW (ref >60)
GFR calc non Af Amer: 4 mL/min — ABNORMAL LOW (ref >60)
Glucose, Bld: 81 mg/dL (ref 65–99)
Potassium: 4.5 mmol/L (ref 3.5–5.1)
Sodium: 141 mmol/L (ref 135–145)
Total Bilirubin: 0.7 mg/dL (ref 0.3–1.2)
Total Protein: 7.1 g/dL (ref 6.5–8.1)

## 2015-07-18 NOTE — ED Notes (Signed)
Pt c/o SOB. Pt states that she missed her dialysis treatment yesterday due to car trouble and contacted the dialysis center and they advised her to come to the hospital because they would not be able to get her in until sat due to the holiday.

## 2015-07-18 NOTE — ED Provider Notes (Signed)
CSN: [id number]     Arrival date & time 07/18/15  1051 History   First MD Initiated Contact with Patient 07/18/15 1100     Chief Complaint  Patient presents with  . Vascular Access Problem  . Shortness of Breath     (Consider location/radiation/quality/duration/timing/severity/associated sxs/prior Treatment) HPI Candace Griffin is a 39 y.o. female on dialysis with a history of renal transplant, comes in for evaluation of dialysis. Patient reports she typically receives dialysis on Monday Wednesday Friday, but was unable to make it to her Wednesday appointment due to car trouble. Her dialysis center is closed on Thanksgiving and she will be unable to get her dialysis until Saturday. She reports associated mild shortness of breath, which is typical just prior to her dialysis. She denies any other medical problems at this time. No fevers, chills, chest pain, abdominal pain, nausea or vomiting, numbness or weakness. She reports she only takes prednisone and does not take Prograf. No other modifying factors. Nephrologist Dr. Jannette Spanner kidney  Past Medical History  Diagnosis Date  . Dialysis patient ([place])   . Hypertension   . History of renal transplant 10/02/11  . GERD (gastroesophageal reflux disease)    Past Surgical History  Procedure Laterality Date  . Diaylsis shunt    . Kidney transplant  2011/10/02    deceased donor kidney   Family History  Problem Relation Age of Onset  . Diabetes Father   . Diabetes Paternal Aunt   . Alcohol abuse Paternal Uncle   . Cancer Maternal Grandmother     breast  . Diabetes Paternal Grandmother   . Alcohol abuse Paternal Grandmother   . Alcohol abuse Paternal Grandfather    Social History  Substance Use Topics  . Smoking status: Never Smoker   . Smokeless tobacco: Never Used  . Alcohol Use: No   OB History    Gravida Para Term Preterm AB TAB SAB Ectopic Multiple Living   3 3        3      Review of Systems A 10 point review of  systems was completed and was negative except for pertinent positives and negatives as mentioned in the history of present illness     Allergies  Review of patient's allergies indicates no known allergies.  Home Medications   Prior to Admission medications   Medication Sig Start Date End Date Taking? Authorizing Provider  amLODipine (NORVASC) 10 MG tablet Take 10 mg by mouth every evening. 07/10/15  Yes Historical Provider, MD  Darbepoetin Alfa (ARANESP) 200 MCG/0.4ML SOSY injection Inject 0.4 mLs (200 mcg total) into the vein every Tuesday with hemodialysis. Patient taking differently: Inject 200 mcg into the vein once a week. On dialysis day Monday, Wednesday or Friday 09/20/14  Yes Virginia Crews, MD  docusate sodium (COLACE) 100 MG capsule Take 200 mg by mouth 2 (two) times daily.   Yes Historical Provider, MD  ibuprofen (ADVIL,MOTRIN) 200 MG tablet Take 400 mg by mouth See admin instructions. Take 2 tablets (400 mg) by mouth approximately 30 minutes before dialysis -Monday, Wednesday, Friday   Yes Historical Provider, MD  labetalol (NORMODYNE) 300 MG tablet Take 300 mg by mouth 3 (three) times daily. 09/25/14  Yes Historical Provider, MD  lisinopril (PRINIVIL,ZESTRIL) 40 MG tablet Take 40 mg by mouth every evening.  07/05/15  Yes Historical Provider, MD  multivitamin (RENA-VIT) TABS tablet Take 1 tablet by mouth at bedtime. Patient taking differently: Take 1 tablet by mouth daily.  09/20/14  Yes Levada Dy  Patrick Jupiter, MD  predniSONE (DELTASONE) 10 MG tablet Take 3 tablets or 30 mg by mouth daily for 7 days. Patient taking differently: Take 10 mg by mouth daily.  02/04/15  Yes Pamella Pert, MD  sevelamer (RENAGEL) 800 MG tablet Take 3,200 mg by mouth 3 (three) times daily with meals.   Yes Historical Provider, MD  traMADol-acetaminophen (ULTRACET) 37.5-325 MG tablet Take 1 tablet by mouth daily as needed. for pain 07/10/15  Yes Historical Provider, MD   BP 140/91 mmHg  Pulse 90   Temp(Src) 98.3 F (36.8 C) (Oral)  Resp 18  Ht 5\' 7"  (1.702 m)  Wt 58.968 kg  BMI 20.36 kg/m2  SpO2 100%  LMP 12/16/2014 Physical Exam  Constitutional: She is oriented to person, place, and time. She appears well-developed and well-nourished.  HENT:  Head: Normocephalic and atraumatic.  Mouth/Throat: Oropharynx is clear and moist.  Eyes: Conjunctivae are normal. Pupils are equal, round, and reactive to light. Right eye exhibits no discharge. Left eye exhibits no discharge. No scleral icterus.  Neck: Neck supple.  Cardiovascular: Normal rate, regular rhythm and normal heart sounds.   Pulmonary/Chest: Effort normal and breath sounds normal. No respiratory distress. She has no wheezes. She has no rales.  Abdominal: Soft. There is no tenderness.  Musculoskeletal: She exhibits no tenderness.  Right forearm AV fistula intact.  Neurological: She is alert and oriented to person, place, and time.  Cranial Nerves II-XII grossly intact  Skin: Skin is warm and dry. No rash noted.  Psychiatric: She has a normal mood and affect.  Nursing note and vitals reviewed.   ED Course  Procedures (including critical care time) Labs Review Labs Reviewed  COMPREHENSIVE METABOLIC PANEL - Abnormal; Notable for the following:    BUN 57 (*)    Creatinine, Ser 11.61 (*)    Albumin 2.8 (*)    AST 45 (*)    GFR calc non Af Amer 4 (*)    GFR calc Af Amer 4 (*)    All other components within normal limits  CBC WITH DIFFERENTIAL/PLATELET - Abnormal; Notable for the following:    RBC 3.19 (*)    Hemoglobin 8.7 (*)    HCT 28.7 (*)    RDW 18.1 (*)    Lymphs Abs 0.5 (*)    All other components within normal limits    Imaging Review No results found. I have personally reviewed and evaluated these images and lab results as part of my medical decision-making.   EKG Interpretation None     Meds given in ED:  Medications - No data to display  New Prescriptions   No medications on file   Filed  Vitals:   07/18/15 1100 07/18/15 1334  BP: 142/99 140/91  Pulse: 80 90  Temp: 98.4 F (36.9 C) 98.3 F (36.8 C)  TempSrc: Oral Oral  Resp: 18   Height: 5\' 7"  (1.702 m)   Weight: 58.968 kg   SpO2: 99% 100%    MDM  Candace Griffin is a 39 y.o. female presents today for dialysis after missing her appointment yesterday, Wednesday. Reports that she typically has dialysis on Monday Wednesday Friday, but missed yesterday and was told she would not be able to dialyze until Saturday. She reports only mild shortness of breath now, which is typical right before she has dialysis. She denies any other medical problems. On arrival, she is hemodynamically stable, normal vital signs and is afebrile. Her exam is unremarkable. Benign cardiopulmonary exam. Labs are noncontributory. Discussed  with nephrology, Dr. Jimmy Footman reports she may go to dialysis tomorrow. No evidence of other acute or emergent pathology at this time. Overall, patient appears well, nontoxic and is appropriate for discharge. Final diagnoses:  Encounter regarding vascular access for dialysis for ESRD [facility])       Comer Locket, PA-C 07/18/15 [place], MD 07/18/15 1546

## 2015-07-18 NOTE — Discharge Instructions (Signed)
You were evaluated in the ED today for your missed dialysis appointment. Your exam and labs were very reassuring. I discussed your visit with your nephrologist, Dr. Jimmy Footman and he reports that you may go to your dialysis center tomorrow, Friday, for your dialysis. Return to ED for any new or worsening symptoms.

## 2015-07-19 DIAGNOSIS — N186 End stage renal disease: Secondary | ICD-10-CM | POA: Diagnosis not present

## 2015-07-19 DIAGNOSIS — D631 Anemia in chronic kidney disease: Secondary | ICD-10-CM | POA: Diagnosis not present

## 2015-07-19 DIAGNOSIS — D509 Iron deficiency anemia, unspecified: Secondary | ICD-10-CM | POA: Diagnosis not present

## 2015-07-19 DIAGNOSIS — N2581 Secondary hyperparathyroidism of renal origin: Secondary | ICD-10-CM | POA: Diagnosis not present

## 2015-07-20 DIAGNOSIS — N186 End stage renal disease: Secondary | ICD-10-CM | POA: Diagnosis not present

## 2015-07-20 DIAGNOSIS — D509 Iron deficiency anemia, unspecified: Secondary | ICD-10-CM | POA: Diagnosis not present

## 2015-07-20 DIAGNOSIS — N2581 Secondary hyperparathyroidism of renal origin: Secondary | ICD-10-CM | POA: Diagnosis not present

## 2015-07-20 DIAGNOSIS — D631 Anemia in chronic kidney disease: Secondary | ICD-10-CM | POA: Diagnosis not present

## 2015-07-22 DIAGNOSIS — N186 End stage renal disease: Secondary | ICD-10-CM | POA: Diagnosis not present

## 2015-07-22 DIAGNOSIS — D631 Anemia in chronic kidney disease: Secondary | ICD-10-CM | POA: Diagnosis not present

## 2015-07-22 DIAGNOSIS — D509 Iron deficiency anemia, unspecified: Secondary | ICD-10-CM | POA: Diagnosis not present

## 2015-07-22 DIAGNOSIS — N2581 Secondary hyperparathyroidism of renal origin: Secondary | ICD-10-CM | POA: Diagnosis not present

## 2015-07-24 DIAGNOSIS — N2581 Secondary hyperparathyroidism of renal origin: Secondary | ICD-10-CM | POA: Diagnosis not present

## 2015-07-24 DIAGNOSIS — I129 Hypertensive chronic kidney disease with stage 1 through stage 4 chronic kidney disease, or unspecified chronic kidney disease: Secondary | ICD-10-CM | POA: Diagnosis not present

## 2015-07-24 DIAGNOSIS — D509 Iron deficiency anemia, unspecified: Secondary | ICD-10-CM | POA: Diagnosis not present

## 2015-07-24 DIAGNOSIS — D631 Anemia in chronic kidney disease: Secondary | ICD-10-CM | POA: Diagnosis not present

## 2015-07-24 DIAGNOSIS — N186 End stage renal disease: Secondary | ICD-10-CM | POA: Diagnosis not present

## 2015-07-24 DIAGNOSIS — Z992 Dependence on renal dialysis: Secondary | ICD-10-CM | POA: Diagnosis not present

## 2015-07-26 DIAGNOSIS — N186 End stage renal disease: Secondary | ICD-10-CM | POA: Diagnosis not present

## 2015-07-26 DIAGNOSIS — D631 Anemia in chronic kidney disease: Secondary | ICD-10-CM | POA: Diagnosis not present

## 2015-07-26 DIAGNOSIS — D509 Iron deficiency anemia, unspecified: Secondary | ICD-10-CM | POA: Diagnosis not present

## 2015-07-26 DIAGNOSIS — N2581 Secondary hyperparathyroidism of renal origin: Secondary | ICD-10-CM | POA: Diagnosis not present

## 2015-07-29 DIAGNOSIS — N2581 Secondary hyperparathyroidism of renal origin: Secondary | ICD-10-CM | POA: Diagnosis not present

## 2015-07-29 DIAGNOSIS — D509 Iron deficiency anemia, unspecified: Secondary | ICD-10-CM | POA: Diagnosis not present

## 2015-07-29 DIAGNOSIS — D631 Anemia in chronic kidney disease: Secondary | ICD-10-CM | POA: Diagnosis not present

## 2015-07-29 DIAGNOSIS — N186 End stage renal disease: Secondary | ICD-10-CM | POA: Diagnosis not present

## 2015-07-31 ENCOUNTER — Other Ambulatory Visit (HOSPITAL_COMMUNITY)
Admission: RE | Admit: 2015-07-31 | Discharge: 2015-07-31 | Disposition: A | Discharge status: Home / Self Care | Payer: Medicare Other | Source: Ambulatory Visit | Attending: Family Medicine | Admitting: Family Medicine

## 2015-07-31 ENCOUNTER — Ambulatory Visit (INDEPENDENT_AMBULATORY_CARE_PROVIDER_SITE_OTHER): Payer: Medicare Other | Admitting: Family Medicine

## 2015-07-31 VITALS — BP 145/91 | HR 90 | Temp 98.2°F | Wt 127.0 lb

## 2015-07-31 DIAGNOSIS — D509 Iron deficiency anemia, unspecified: Secondary | ICD-10-CM | POA: Diagnosis not present

## 2015-07-31 DIAGNOSIS — Z01411 Encounter for gynecological examination (general) (routine) with abnormal findings: Secondary | ICD-10-CM | POA: Insufficient documentation

## 2015-07-31 DIAGNOSIS — D631 Anemia in chronic kidney disease: Secondary | ICD-10-CM | POA: Diagnosis not present

## 2015-07-31 DIAGNOSIS — Z0000 Encounter for general adult medical examination without abnormal findings: Secondary | ICD-10-CM | POA: Diagnosis not present

## 2015-07-31 DIAGNOSIS — N2581 Secondary hyperparathyroidism of renal origin: Secondary | ICD-10-CM | POA: Diagnosis not present

## 2015-07-31 DIAGNOSIS — N186 End stage renal disease: Secondary | ICD-10-CM | POA: Diagnosis not present

## 2015-07-31 DIAGNOSIS — Z01419 Encounter for gynecological examination (general) (routine) without abnormal findings: Secondary | ICD-10-CM

## 2015-07-31 LAB — POCT WET PREP (WET MOUNT): Clue Cells Wet Prep Whiff POC: NEGATIVE

## 2015-07-31 MED ORDER — FLUCONAZOLE 150 MG PO TABS: 150.0000 mg | ORAL_TABLET | Freq: Once | ORAL | Status: DC

## 2015-07-31 NOTE — Patient Instructions (Signed)
You were noted to have a yeast infection, sometimes when this is severe it can cause irritation of the cervix and bleeding. I have prescribed Diflucan for this If you bleeding does not improve, please come back and see me If you do not hear from me by Tuesday, please contact our office so I can discuss your pap smear results which should be back at that time.  Things to do to Keep yourself Healthy - Exercise at least 30-45 minutes a day,  3-4 days a week.  - Eat a low-fat diet with lots of fruits and vegetables, up to 7-9 servings per day. - Seatbelts can save your life. Wear them always. - Smoke detectors on every level of your home, check batteries every year. - Eye Doctor - have an eye exam every 1-2 years - Safe sex - if you may be exposed to STDs, use a condom. - Alcohol If you drink, do it moderately,less than 2 drinks per day. - [place].  Choose someone to speak for you if you are not able. - Depression is common in our stressful world.If you're feeling down or losing interest in things you normally enjoy, please come in for a visit. - Violence - If anyone is threatening or hurting you, please call immediately.

## 2015-07-31 NOTE — Progress Notes (Signed)
Patient ID: Candace Griffin, female   DOB: [birth year], 39 y.o.   MRN: [id number] 39 y.o. year old female presents for well woman/preventative visit and annual GYN examination. She had a pap smear 04/04/14: LSIL/CIN-1 +HPV and then a colposcopy LSIL/CIN-1 (mild dysplasia)  Acute Concerns: Going to [facility] for possible kidney transplant, has been on a HD since Feb 2016, started initially in 2010, got a transplant in 2013, then there were concerns for rejection. Her hemoglobin is fluctuating; she has not had a menses in 2 months. Every time she urinates she has vaginal bleeding. She denies any hematochezia or melena.   Diet: varied and balanced   Social:  Social History   Social History  . Marital Status: Single    Spouse Name: N/A  . Number of Children: N/A  . Years of Education: N/A   Social History Main Topics  . Smoking status: Never Smoker   . Smokeless tobacco: Never Used  . Alcohol Use: No  . Drug Use: No  . Sexual Activity: Not Currently    Birth Control/ Protection: None   Other Topics Concern  . None   Social History Narrative    Immunization:  Tdap/TD: declines today  Influenza: declines today   Pneumococcal:  Herpes Zoster:  Cancer Screening:  Pap Smear:  pap smear 04/04/14: LSIL/CIN-1 +HPV and then a colposcopy LSIL/CIN-1 (mild dysplasia)  Mammogram: N/A, no family h/o breast cancer  Colonoscopy: N/A no family h/o colon cancer.  Dexa: N/A   Physical Exam: Last menstrual period 12/16/2014. Today's Vitals   08/02/15 1823  BP: 145/91  Pulse: 90  Temp: 98.2 F (36.8 C)  Weight: 127 lb (57.607 kg)  General: Pleasant in NAD  Eyes: Conjunctivae non-injected.  ENTM: Moist mucous membranes. Oropharynx clear. No nasal discharge.  Neck: Supple, no LAD Cardiovascular: RRR. No murmurs, rubs, or gallops noted. No pitting edema noted. Respiratory: No increased WOB. CTAB without wheezing, rhonchi, or crackles noted. Abdomen: +BS, soft, non-distended, non-tender.   GYN:  External genitalia within normal limits.  Vaginal mucosa pink, moist, normal rugae. Initially no bleeding noted on speculum exam.  Friable cervix. Thick white discharge noted. Bimanual exam revealed normal, nongravid uterus.  No cervical motion tenderness. No adnexal masses bilaterally.     ASSESSMENT & PLAN: 39 y.o. female presents for annual well woman/preventative exam and GYN exam. Please see problem specific assessment and plan.

## 2015-08-02 ENCOUNTER — Encounter: Payer: Self-pay | Admitting: Family Medicine

## 2015-08-02 DIAGNOSIS — N186 End stage renal disease: Secondary | ICD-10-CM | POA: Diagnosis not present

## 2015-08-02 DIAGNOSIS — D509 Iron deficiency anemia, unspecified: Secondary | ICD-10-CM | POA: Diagnosis not present

## 2015-08-02 DIAGNOSIS — D631 Anemia in chronic kidney disease: Secondary | ICD-10-CM | POA: Diagnosis not present

## 2015-08-02 DIAGNOSIS — N2581 Secondary hyperparathyroidism of renal origin: Secondary | ICD-10-CM | POA: Diagnosis not present

## 2015-08-02 LAB — CYTOLOGY - PAP

## 2015-08-02 NOTE — Assessment & Plan Note (Signed)
Patient with a h/o LSIL/CIN-1 in 2015 with a friable cervix on exam. This could be secondary to yeast infection noted on wet prep. Treated with Diflucan 150mg  x 1.  Pap smear performed today. Discussed that if the patient continued to have vaginal bleeding, she should come back in to be evaluated. Declined FOBT today as she was given stool cards by her transplant doctors in [facility], will follow these up. No need for mammogram or colonoscopy currently  Will re-address vaccinations at next visit.

## 2015-08-07 ENCOUNTER — Other Ambulatory Visit: Payer: Self-pay | Admitting: Family Medicine

## 2015-08-07 ENCOUNTER — Encounter: Payer: Self-pay | Admitting: Family Medicine

## 2015-08-07 ENCOUNTER — Telehealth: Payer: Self-pay | Admitting: Family Medicine

## 2015-08-07 DIAGNOSIS — D509 Iron deficiency anemia, unspecified: Secondary | ICD-10-CM | POA: Diagnosis not present

## 2015-08-07 DIAGNOSIS — N2581 Secondary hyperparathyroidism of renal origin: Secondary | ICD-10-CM | POA: Diagnosis not present

## 2015-08-07 DIAGNOSIS — D631 Anemia in chronic kidney disease: Secondary | ICD-10-CM | POA: Diagnosis not present

## 2015-08-07 DIAGNOSIS — R87612 Low grade squamous intraepithelial lesion on cytologic smear of cervix (LGSIL): Secondary | ICD-10-CM

## 2015-08-07 DIAGNOSIS — N186 End stage renal disease: Secondary | ICD-10-CM | POA: Diagnosis not present

## 2015-08-07 NOTE — Telephone Encounter (Signed)
Called and discussed pap smear results of LSIL and HPV with the patient. Patient would prefer to have colposcopy with Dr. Elly Modena (OB/gyn) as she was the one who performed this last time.  Will make a referral back.   Archie Patten, MD [facility] Resident  08/07/2015, 8:33 AM

## 2015-08-10 DIAGNOSIS — N2581 Secondary hyperparathyroidism of renal origin: Secondary | ICD-10-CM | POA: Diagnosis not present

## 2015-08-10 DIAGNOSIS — D509 Iron deficiency anemia, unspecified: Secondary | ICD-10-CM | POA: Diagnosis not present

## 2015-08-10 DIAGNOSIS — D631 Anemia in chronic kidney disease: Secondary | ICD-10-CM | POA: Diagnosis not present

## 2015-08-10 DIAGNOSIS — N186 End stage renal disease: Secondary | ICD-10-CM | POA: Diagnosis not present

## 2015-08-12 DIAGNOSIS — N2581 Secondary hyperparathyroidism of renal origin: Secondary | ICD-10-CM | POA: Diagnosis not present

## 2015-08-12 DIAGNOSIS — D509 Iron deficiency anemia, unspecified: Secondary | ICD-10-CM | POA: Diagnosis not present

## 2015-08-12 DIAGNOSIS — N186 End stage renal disease: Secondary | ICD-10-CM | POA: Diagnosis not present

## 2015-08-12 DIAGNOSIS — D631 Anemia in chronic kidney disease: Secondary | ICD-10-CM | POA: Diagnosis not present

## 2015-08-14 DIAGNOSIS — N2581 Secondary hyperparathyroidism of renal origin: Secondary | ICD-10-CM | POA: Diagnosis not present

## 2015-08-14 DIAGNOSIS — D631 Anemia in chronic kidney disease: Secondary | ICD-10-CM | POA: Diagnosis not present

## 2015-08-14 DIAGNOSIS — D509 Iron deficiency anemia, unspecified: Secondary | ICD-10-CM | POA: Diagnosis not present

## 2015-08-14 DIAGNOSIS — N186 End stage renal disease: Secondary | ICD-10-CM | POA: Diagnosis not present

## 2015-08-16 DIAGNOSIS — N186 End stage renal disease: Secondary | ICD-10-CM | POA: Diagnosis not present

## 2015-08-16 DIAGNOSIS — N2581 Secondary hyperparathyroidism of renal origin: Secondary | ICD-10-CM | POA: Diagnosis not present

## 2015-08-16 DIAGNOSIS — D509 Iron deficiency anemia, unspecified: Secondary | ICD-10-CM | POA: Diagnosis not present

## 2015-08-16 DIAGNOSIS — D631 Anemia in chronic kidney disease: Secondary | ICD-10-CM | POA: Diagnosis not present

## 2015-08-19 DIAGNOSIS — D509 Iron deficiency anemia, unspecified: Secondary | ICD-10-CM | POA: Diagnosis not present

## 2015-08-19 DIAGNOSIS — D631 Anemia in chronic kidney disease: Secondary | ICD-10-CM | POA: Diagnosis not present

## 2015-08-19 DIAGNOSIS — N186 End stage renal disease: Secondary | ICD-10-CM | POA: Diagnosis not present

## 2015-08-19 DIAGNOSIS — N2581 Secondary hyperparathyroidism of renal origin: Secondary | ICD-10-CM | POA: Diagnosis not present

## 2015-08-21 DIAGNOSIS — D509 Iron deficiency anemia, unspecified: Secondary | ICD-10-CM | POA: Diagnosis not present

## 2015-08-21 DIAGNOSIS — D631 Anemia in chronic kidney disease: Secondary | ICD-10-CM | POA: Diagnosis not present

## 2015-08-21 DIAGNOSIS — N2581 Secondary hyperparathyroidism of renal origin: Secondary | ICD-10-CM | POA: Diagnosis not present

## 2015-08-21 DIAGNOSIS — N186 End stage renal disease: Secondary | ICD-10-CM | POA: Diagnosis not present

## 2015-08-23 DIAGNOSIS — N2581 Secondary hyperparathyroidism of renal origin: Secondary | ICD-10-CM | POA: Diagnosis not present

## 2015-08-23 DIAGNOSIS — D631 Anemia in chronic kidney disease: Secondary | ICD-10-CM | POA: Diagnosis not present

## 2015-08-23 DIAGNOSIS — N186 End stage renal disease: Secondary | ICD-10-CM | POA: Diagnosis not present

## 2015-08-23 DIAGNOSIS — D509 Iron deficiency anemia, unspecified: Secondary | ICD-10-CM | POA: Diagnosis not present

## 2015-08-24 DIAGNOSIS — N186 End stage renal disease: Secondary | ICD-10-CM | POA: Diagnosis not present

## 2015-08-24 DIAGNOSIS — Z992 Dependence on renal dialysis: Secondary | ICD-10-CM | POA: Diagnosis not present

## 2015-08-24 DIAGNOSIS — I129 Hypertensive chronic kidney disease with stage 1 through stage 4 chronic kidney disease, or unspecified chronic kidney disease: Secondary | ICD-10-CM | POA: Diagnosis not present

## 2015-08-26 DIAGNOSIS — D509 Iron deficiency anemia, unspecified: Secondary | ICD-10-CM | POA: Diagnosis not present

## 2015-08-26 DIAGNOSIS — N186 End stage renal disease: Secondary | ICD-10-CM | POA: Diagnosis not present

## 2015-08-26 DIAGNOSIS — N2581 Secondary hyperparathyroidism of renal origin: Secondary | ICD-10-CM | POA: Diagnosis not present

## 2015-08-26 DIAGNOSIS — D631 Anemia in chronic kidney disease: Secondary | ICD-10-CM | POA: Diagnosis not present

## 2015-08-28 DIAGNOSIS — D509 Iron deficiency anemia, unspecified: Secondary | ICD-10-CM | POA: Diagnosis not present

## 2015-08-28 DIAGNOSIS — D631 Anemia in chronic kidney disease: Secondary | ICD-10-CM | POA: Diagnosis not present

## 2015-08-28 DIAGNOSIS — N186 End stage renal disease: Secondary | ICD-10-CM | POA: Diagnosis not present

## 2015-08-28 DIAGNOSIS — N2581 Secondary hyperparathyroidism of renal origin: Secondary | ICD-10-CM | POA: Diagnosis not present

## 2015-08-30 DIAGNOSIS — D509 Iron deficiency anemia, unspecified: Secondary | ICD-10-CM | POA: Diagnosis not present

## 2015-08-30 DIAGNOSIS — N2581 Secondary hyperparathyroidism of renal origin: Secondary | ICD-10-CM | POA: Diagnosis not present

## 2015-08-30 DIAGNOSIS — D631 Anemia in chronic kidney disease: Secondary | ICD-10-CM | POA: Diagnosis not present

## 2015-08-30 DIAGNOSIS — N186 End stage renal disease: Secondary | ICD-10-CM | POA: Diagnosis not present

## 2015-09-03 DIAGNOSIS — D509 Iron deficiency anemia, unspecified: Secondary | ICD-10-CM | POA: Diagnosis not present

## 2015-09-03 DIAGNOSIS — D631 Anemia in chronic kidney disease: Secondary | ICD-10-CM | POA: Diagnosis not present

## 2015-09-03 DIAGNOSIS — N2581 Secondary hyperparathyroidism of renal origin: Secondary | ICD-10-CM | POA: Diagnosis not present

## 2015-09-03 DIAGNOSIS — N186 End stage renal disease: Secondary | ICD-10-CM | POA: Diagnosis not present

## 2015-09-05 ENCOUNTER — Other Ambulatory Visit (HOSPITAL_COMMUNITY)
Admission: RE | Admit: 2015-09-05 | Discharge: 2015-09-05 | Disposition: A | Discharge status: Home / Self Care | Payer: Medicare Other | Source: Ambulatory Visit | Attending: Obstetrics and Gynecology | Admitting: Obstetrics and Gynecology

## 2015-09-05 ENCOUNTER — Ambulatory Visit (INDEPENDENT_AMBULATORY_CARE_PROVIDER_SITE_OTHER): Payer: Medicare Other | Admitting: Obstetrics and Gynecology

## 2015-09-05 ENCOUNTER — Encounter: Payer: Self-pay | Admitting: Obstetrics and Gynecology

## 2015-09-05 VITALS — BP 142/88 | HR 80 | Temp 98.4°F | Ht 67.0 in | Wt 126.2 lb

## 2015-09-05 DIAGNOSIS — R87612 Low grade squamous intraepithelial lesion on cytologic smear of cervix (LGSIL): Secondary | ICD-10-CM

## 2015-09-05 DIAGNOSIS — N871 Moderate cervical dysplasia: Secondary | ICD-10-CM | POA: Diagnosis not present

## 2015-09-05 DIAGNOSIS — R300 Dysuria: Secondary | ICD-10-CM | POA: Diagnosis not present

## 2015-09-05 NOTE — Progress Notes (Signed)
Patient ID: Candace Griffin, female   DOB: 1975-12-17, 40 y.o.   MRN: [id number] 40 yo with LSIL on 07/31/15 pap smear here for colposcopy. Patient also reports onset of dysuria and urgency over the past few days and desires to be screen for a UTI  Patient given informed consent, signed copy in the chart, time out was performed.  Placed in lithotomy position. Cervix viewed with speculum and colposcope after application of acetic acid.   Colposcopy adequate?  No TZ not visualized Acetowhite lesions?yes 11, 4 and 7 o'clock Punctation?no Mosaicism?  no Abnormal vasculature?  no Biopsies?yes 11, 4 and 7 o'clock ECC?yes  COMMENTS: Patient was given post procedure instructions.  She will return in 2 weeks for results.  Mora Bellman, MD

## 2015-09-05 NOTE — Progress Notes (Signed)
Due to ESRD does not make urine but rarely, unable to do upt today.

## 2015-09-05 NOTE — Addendum Note (Signed)
Addended by: Samuel Germany on: 09/05/2015 04:31 PM   Modules accepted: Orders

## 2015-09-06 DIAGNOSIS — D509 Iron deficiency anemia, unspecified: Secondary | ICD-10-CM | POA: Diagnosis not present

## 2015-09-06 DIAGNOSIS — N2581 Secondary hyperparathyroidism of renal origin: Secondary | ICD-10-CM | POA: Diagnosis not present

## 2015-09-06 DIAGNOSIS — D631 Anemia in chronic kidney disease: Secondary | ICD-10-CM | POA: Diagnosis not present

## 2015-09-06 DIAGNOSIS — N186 End stage renal disease: Secondary | ICD-10-CM | POA: Diagnosis not present

## 2015-09-09 DIAGNOSIS — N2581 Secondary hyperparathyroidism of renal origin: Secondary | ICD-10-CM | POA: Diagnosis not present

## 2015-09-09 DIAGNOSIS — D631 Anemia in chronic kidney disease: Secondary | ICD-10-CM | POA: Diagnosis not present

## 2015-09-09 DIAGNOSIS — N186 End stage renal disease: Secondary | ICD-10-CM | POA: Diagnosis not present

## 2015-09-09 DIAGNOSIS — D509 Iron deficiency anemia, unspecified: Secondary | ICD-10-CM | POA: Diagnosis not present

## 2015-09-11 ENCOUNTER — Telehealth: Payer: Self-pay

## 2015-09-11 DIAGNOSIS — D631 Anemia in chronic kidney disease: Secondary | ICD-10-CM | POA: Diagnosis not present

## 2015-09-11 DIAGNOSIS — N2581 Secondary hyperparathyroidism of renal origin: Secondary | ICD-10-CM | POA: Diagnosis not present

## 2015-09-11 DIAGNOSIS — N186 End stage renal disease: Secondary | ICD-10-CM | POA: Diagnosis not present

## 2015-09-11 DIAGNOSIS — D509 Iron deficiency anemia, unspecified: Secondary | ICD-10-CM | POA: Diagnosis not present

## 2015-09-11 NOTE — Telephone Encounter (Signed)
Pt has been notified of lab results and she has been scheduled for a leep procedure

## 2015-09-13 DIAGNOSIS — D509 Iron deficiency anemia, unspecified: Secondary | ICD-10-CM | POA: Diagnosis not present

## 2015-09-13 DIAGNOSIS — N186 End stage renal disease: Secondary | ICD-10-CM | POA: Diagnosis not present

## 2015-09-13 DIAGNOSIS — N2581 Secondary hyperparathyroidism of renal origin: Secondary | ICD-10-CM | POA: Diagnosis not present

## 2015-09-13 DIAGNOSIS — D631 Anemia in chronic kidney disease: Secondary | ICD-10-CM | POA: Diagnosis not present

## 2015-09-16 DIAGNOSIS — D509 Iron deficiency anemia, unspecified: Secondary | ICD-10-CM | POA: Diagnosis not present

## 2015-09-16 DIAGNOSIS — N2581 Secondary hyperparathyroidism of renal origin: Secondary | ICD-10-CM | POA: Diagnosis not present

## 2015-09-16 DIAGNOSIS — N186 End stage renal disease: Secondary | ICD-10-CM | POA: Diagnosis not present

## 2015-09-16 DIAGNOSIS — D631 Anemia in chronic kidney disease: Secondary | ICD-10-CM | POA: Diagnosis not present

## 2015-09-18 DIAGNOSIS — N2581 Secondary hyperparathyroidism of renal origin: Secondary | ICD-10-CM | POA: Diagnosis not present

## 2015-09-18 DIAGNOSIS — D509 Iron deficiency anemia, unspecified: Secondary | ICD-10-CM | POA: Diagnosis not present

## 2015-09-18 DIAGNOSIS — D631 Anemia in chronic kidney disease: Secondary | ICD-10-CM | POA: Diagnosis not present

## 2015-09-18 DIAGNOSIS — N186 End stage renal disease: Secondary | ICD-10-CM | POA: Diagnosis not present

## 2015-09-20 DIAGNOSIS — D631 Anemia in chronic kidney disease: Secondary | ICD-10-CM | POA: Diagnosis not present

## 2015-09-20 DIAGNOSIS — N186 End stage renal disease: Secondary | ICD-10-CM | POA: Diagnosis not present

## 2015-09-20 DIAGNOSIS — N2581 Secondary hyperparathyroidism of renal origin: Secondary | ICD-10-CM | POA: Diagnosis not present

## 2015-09-20 DIAGNOSIS — D509 Iron deficiency anemia, unspecified: Secondary | ICD-10-CM | POA: Diagnosis not present

## 2015-09-23 DIAGNOSIS — N2581 Secondary hyperparathyroidism of renal origin: Secondary | ICD-10-CM | POA: Diagnosis not present

## 2015-09-23 DIAGNOSIS — D509 Iron deficiency anemia, unspecified: Secondary | ICD-10-CM | POA: Diagnosis not present

## 2015-09-23 DIAGNOSIS — N186 End stage renal disease: Secondary | ICD-10-CM | POA: Diagnosis not present

## 2015-09-23 DIAGNOSIS — D631 Anemia in chronic kidney disease: Secondary | ICD-10-CM | POA: Diagnosis not present

## 2015-09-24 DIAGNOSIS — N186 End stage renal disease: Secondary | ICD-10-CM | POA: Diagnosis not present

## 2015-09-24 DIAGNOSIS — I129 Hypertensive chronic kidney disease with stage 1 through stage 4 chronic kidney disease, or unspecified chronic kidney disease: Secondary | ICD-10-CM | POA: Diagnosis not present

## 2015-09-24 DIAGNOSIS — Z992 Dependence on renal dialysis: Secondary | ICD-10-CM | POA: Diagnosis not present

## 2015-09-25 DIAGNOSIS — D631 Anemia in chronic kidney disease: Secondary | ICD-10-CM | POA: Diagnosis not present

## 2015-09-25 DIAGNOSIS — N2581 Secondary hyperparathyroidism of renal origin: Secondary | ICD-10-CM | POA: Diagnosis not present

## 2015-09-25 DIAGNOSIS — D509 Iron deficiency anemia, unspecified: Secondary | ICD-10-CM | POA: Diagnosis not present

## 2015-09-25 DIAGNOSIS — N186 End stage renal disease: Secondary | ICD-10-CM | POA: Diagnosis not present

## 2015-09-26 ENCOUNTER — Encounter: Payer: Self-pay | Admitting: Family Medicine

## 2015-09-26 ENCOUNTER — Ambulatory Visit (INDEPENDENT_AMBULATORY_CARE_PROVIDER_SITE_OTHER): Payer: Medicare Other | Admitting: Family Medicine

## 2015-09-26 VITALS — BP 177/104 | HR 91 | Temp 98.4°F | Wt 123.0 lb

## 2015-09-26 DIAGNOSIS — R300 Dysuria: Secondary | ICD-10-CM

## 2015-09-26 LAB — POCT URINALYSIS DIPSTICK
Bilirubin, UA: NEGATIVE
Glucose, UA: 100
Ketones, UA: NEGATIVE
Nitrite, UA: NEGATIVE
Protein, UA: >=300
Spec Grav, UA: 1.02
Urobilinogen, UA: 0.2
pH, UA: >=9

## 2015-09-26 MED ORDER — CIPROFLOXACIN HCL 250 MG PO TABS: 250.0000 mg | ORAL_TABLET | Freq: Every day | ORAL | Status: DC

## 2015-09-26 NOTE — Assessment & Plan Note (Addendum)
Patient presenting with dysuria and urinary urgency. Difficult to determine if this was from a UTI or simply decreasing urine output given she's been on HD for some time. Urine dipstick looked consistent with UTI, unfortunately not enough urine to perform microscopy or urine culture. No systemic features noted. Bladder scan obtained and revealed 177cc post-void residual. Will avoid Keflex due to h/o of very bad diarrhea. - Ciprofloxacin 250mg  daily at night (renally dosed) - discussed reasons to RTC: fevers, chills, no urine out put overnight (as I would then be more worried about retention). Also discussed following up with [facility] within the next few weeks.

## 2015-09-26 NOTE — Progress Notes (Signed)
Patient ID: Candace Griffin, female   DOB: 1975-09-17, 40 y.o.   MRN: [id number]    Subjective: CC: concerns for UTI  HPI: Patient is a 40 y.o. female with a past medical history of ESRD on HD presenting to clinic today for a same day appt for concerns for UTI .  Patient usually urinates 3x/day, approximately 2oz (she can count to 30 during urination). Now she can only count to 5 now so she suspects that she only urinates approximately 10-15cc. When she is able to urinate, she feels like she has more to urinate but can't  and has some burning. She always has some pain over the R kidney as this was her rejected kidney.  She notes this has been going on for several months. She was prescribed Keflex "several months" ago for these symptoms but notes that she discontinued it after 2 days due to severe diarrhea that she could not tolerate while being at dialysis. She felt her symptoms initially improved but then worsened again several months ago.  On calling her pharmacy, that Rx for Keflex was in June 2016.  She denies any fevers, chills, nausea, vomiting, or back pain.   She is not sexually active in over 1 year and has been tested for STDs since that time. She has a yellow thin vaginla  discharge that is normal but more copious and smells "musty."  She's had BV and yeast infectious often in the past but notes this is nothing like that.    Social History: Non-smoker   Health Maintenance: patient declines flu vaccine today.   ROS: All other systems reviewed and are negative besides that in the HPI.  Past Medical History Patient Active Problem List   Diagnosis Date Noted  . Essential hypertension   . Absolute anemia   . AKI (acute kidney injury) ([place]) 09/17/2014  . Acute renal failure ([place]) 09/17/2014  . Low grade squamous intraepithelial lesion (LGSIL) on cervical Pap smear 05/03/2014  . Pyelonephritis 11/02/2013  . Genital warts 07/31/2013  . Atypical squamous cell changes of undetermined  significance (ASCUS) on cervical cytology with positive high risk human papilloma virus (HPV) 07/19/2013  . Dysuria 03/01/2013  . Right ankle pain 12/13/2012  . Hypertension 04/21/2012  . Well woman exam 04/21/2012  . History of renal transplant 01/07/2012  . Cerumen impaction 12/29/2011  . Bacterial vaginosis 04/29/2011  . GERD 10/10/2009  . ANEMIA 08/11/2007  . GENITAL HERPES, HX OF 08/11/2007  . End-stage renal disease needing dialysis ([place]) 10/21/2006    Medications- reviewed and updated Current Outpatient Prescriptions  Medication Sig Dispense Refill  . amLODipine (NORVASC) 10 MG tablet Take 10 mg by mouth every evening.  11  . ciprofloxacin (CIPRO) 250 MG tablet Take 1 tablet (250 mg total) by mouth at bedtime. 7 tablet 0  . Darbepoetin Alfa (ARANESP) 200 MCG/0.4ML SOSY injection Inject 0.4 mLs (200 mcg total) into the vein every Tuesday with hemodialysis. (Patient taking differently: Inject 200 mcg into the vein once a week. On dialysis day Monday, Wednesday or Friday) 1.68 mL 0  . docusate sodium (COLACE) 100 MG capsule Take 200 mg by mouth 2 (two) times daily.    . fluconazole (DIFLUCAN) 150 MG tablet Take 1 tablet (150 mg total) by mouth once. 1 tablet 0  . labetalol (NORMODYNE) 300 MG tablet Take 300 mg by mouth 3 (three) times daily.    Marland Kitchen lisinopril (PRINIVIL,ZESTRIL) 40 MG tablet Take 40 mg by mouth every evening.   3  .  multivitamin (RENA-VIT) TABS tablet Take 1 tablet by mouth at bedtime. (Patient taking differently: Take 1 tablet by mouth daily. )  0  . predniSONE (DELTASONE) 10 MG tablet Take 3 tablets or 30 mg by mouth daily for 7 days. (Patient taking differently: Take 10 mg by mouth daily. ) 21 tablet 0  . sevelamer (RENAGEL) 800 MG tablet Take 3,200 mg by mouth 3 (three) times daily with meals.    . traMADol-acetaminophen (ULTRACET) 37.5-325 MG tablet Take 1 tablet by mouth daily as needed. for pain  0   No current facility-administered medications for this visit.     Objective: Office vital signs reviewed. BP 177/104 mmHg  Pulse 91  Temp(Src) 98.4 F (36.9 C) (Oral)  Wt 123 lb (55.792 kg)  LMP 03/05/2015   Physical Examination:  General: Awake, alert, well- nourished, NAD,  GI: soft, non-distended. No tenderness to mild palpation. Tenderness to deep palpation over the right lower quadrant. No rebound or guarding. No CVA tenderness.  GU: deffered per patient request.  Post void residual: 177cc  Urinalysis    Component Value Date/Time   COLORURINE YELLOW 02/03/2015 1700   APPEARANCEUR TURBID* 02/03/2015 1700   LABSPEC 1.026 02/03/2015 1700   PHURINE 7.5 02/03/2015 1700   GLUCOSEU NEGATIVE 02/03/2015 1700   HGBUR SMALL* 02/03/2015 1700   HGBUR moderate 02/03/2010 1321   BILIRUBINUR NEG 09/26/2015 1406   BILIRUBINUR NEGATIVE 02/03/2015 1700   KETONESUR NEGATIVE 02/03/2015 1700   PROTEINUR >=300 09/26/2015 1406   PROTEINUR >300* 02/03/2015 1700   UROBILINOGEN 0.2 09/26/2015 1406   UROBILINOGEN 0.2 02/03/2015 1700   NITRITE NEG 09/26/2015 1406   NITRITE NEGATIVE 02/03/2015 1700   LEUKOCYTESUR moderate (2+)* 09/26/2015 1406    Assessment/Plan: Dysuria Patient presenting with dysuria and urinary urgency. Difficult to determine if this was from a UTI or simply decreasing urine output given she's been on HD for some time. Urine dipstick looked consistent with UTI, unfortunately not enough urine to perform microscopy or urine culture. No systemic features noted. Bladder scan obtained and revealed 177cc post-void residual. Will avoid Keflex due to h/o of very bad diarrhea. - Ciprofloxacin 250mg  daily at night (renally dosed) - discussed reasons to RTC: fevers, chills, no urine out put overnight (as I would then be more worried about retention). Also discussed following up with [facility] within the next few weeks.     Orders Placed This Encounter  Procedures  . POCT urinalysis dipstick    Meds ordered this encounter  Medications   . ciprofloxacin (CIPRO) 250 MG tablet    Sig: Take 1 tablet (250 mg total) by mouth at bedtime.    Dispense:  7 tablet    Refill:  [place] PGY-2, [place]

## 2015-09-27 DIAGNOSIS — N186 End stage renal disease: Secondary | ICD-10-CM | POA: Diagnosis not present

## 2015-09-27 DIAGNOSIS — N2581 Secondary hyperparathyroidism of renal origin: Secondary | ICD-10-CM | POA: Diagnosis not present

## 2015-09-27 DIAGNOSIS — D509 Iron deficiency anemia, unspecified: Secondary | ICD-10-CM | POA: Diagnosis not present

## 2015-09-27 DIAGNOSIS — D631 Anemia in chronic kidney disease: Secondary | ICD-10-CM | POA: Diagnosis not present

## 2015-09-30 DIAGNOSIS — N186 End stage renal disease: Secondary | ICD-10-CM | POA: Diagnosis not present

## 2015-09-30 DIAGNOSIS — D509 Iron deficiency anemia, unspecified: Secondary | ICD-10-CM | POA: Diagnosis not present

## 2015-09-30 DIAGNOSIS — N2581 Secondary hyperparathyroidism of renal origin: Secondary | ICD-10-CM | POA: Diagnosis not present

## 2015-09-30 DIAGNOSIS — D631 Anemia in chronic kidney disease: Secondary | ICD-10-CM | POA: Diagnosis not present

## 2015-10-02 DIAGNOSIS — D509 Iron deficiency anemia, unspecified: Secondary | ICD-10-CM | POA: Diagnosis not present

## 2015-10-02 DIAGNOSIS — N2581 Secondary hyperparathyroidism of renal origin: Secondary | ICD-10-CM | POA: Diagnosis not present

## 2015-10-02 DIAGNOSIS — N186 End stage renal disease: Secondary | ICD-10-CM | POA: Diagnosis not present

## 2015-10-02 DIAGNOSIS — D631 Anemia in chronic kidney disease: Secondary | ICD-10-CM | POA: Diagnosis not present

## 2015-10-03 ENCOUNTER — Encounter: Payer: Self-pay | Admitting: Obstetrics and Gynecology

## 2015-10-03 ENCOUNTER — Other Ambulatory Visit (HOSPITAL_COMMUNITY)
Admission: RE | Admit: 2015-10-03 | Discharge: 2015-10-03 | Disposition: A | Discharge status: Home / Self Care | Payer: Medicare Other | Source: Ambulatory Visit | Attending: Obstetrics and Gynecology | Admitting: Obstetrics and Gynecology

## 2015-10-03 ENCOUNTER — Ambulatory Visit (INDEPENDENT_AMBULATORY_CARE_PROVIDER_SITE_OTHER): Payer: Medicare Other | Admitting: Obstetrics and Gynecology

## 2015-10-03 VITALS — BP 165/98 | HR 89 | Temp 98.6°F | Wt 125.5 lb

## 2015-10-03 DIAGNOSIS — R87612 Low grade squamous intraepithelial lesion on cytologic smear of cervix (LGSIL): Secondary | ICD-10-CM

## 2015-10-03 DIAGNOSIS — N871 Moderate cervical dysplasia: Secondary | ICD-10-CM | POA: Diagnosis not present

## 2015-10-03 NOTE — Progress Notes (Signed)
Here for LEEP.  States since she is on hemodialysis, does not make urine often. Unable to urinate today. States did not take blood pressure meds yet today.

## 2015-10-03 NOTE — Progress Notes (Signed)
Patient identified, informed consent obtained, signed copy in chart, time out performed.  Pap smear and colposcopy reviewed.   Pap LGSIL 07/2015 Colpo Biopsy CIN 2 09/05/15 ECC negative Teflon coated speculum with smoke evacuator placed.  Cervix visualized. Paracervical block placed.  Large size LOOP used to remove cone of cervix using blend of cut and cautery on LEEP machine.  Edges/Base cauterized with Ball.  Monsel's solution used for hemostasis.  Patient tolerated procedure well.  Patient given post procedure instructions.  Follow up in 6 months for repeat pap or as needed.

## 2015-10-04 DIAGNOSIS — N186 End stage renal disease: Secondary | ICD-10-CM | POA: Diagnosis not present

## 2015-10-04 DIAGNOSIS — D631 Anemia in chronic kidney disease: Secondary | ICD-10-CM | POA: Diagnosis not present

## 2015-10-04 DIAGNOSIS — N2581 Secondary hyperparathyroidism of renal origin: Secondary | ICD-10-CM | POA: Diagnosis not present

## 2015-10-04 DIAGNOSIS — D509 Iron deficiency anemia, unspecified: Secondary | ICD-10-CM | POA: Diagnosis not present

## 2015-10-07 ENCOUNTER — Telehealth: Payer: Self-pay

## 2015-10-07 DIAGNOSIS — D509 Iron deficiency anemia, unspecified: Secondary | ICD-10-CM | POA: Diagnosis not present

## 2015-10-07 DIAGNOSIS — D631 Anemia in chronic kidney disease: Secondary | ICD-10-CM | POA: Diagnosis not present

## 2015-10-07 DIAGNOSIS — N186 End stage renal disease: Secondary | ICD-10-CM | POA: Diagnosis not present

## 2015-10-07 DIAGNOSIS — N2581 Secondary hyperparathyroidism of renal origin: Secondary | ICD-10-CM | POA: Diagnosis not present

## 2015-10-07 NOTE — Telephone Encounter (Signed)
Pt has been informed of lab results and will follow up in one year for a repeat pap smear.

## 2015-10-09 DIAGNOSIS — N186 End stage renal disease: Secondary | ICD-10-CM | POA: Diagnosis not present

## 2015-10-09 DIAGNOSIS — N2581 Secondary hyperparathyroidism of renal origin: Secondary | ICD-10-CM | POA: Diagnosis not present

## 2015-10-09 DIAGNOSIS — D631 Anemia in chronic kidney disease: Secondary | ICD-10-CM | POA: Diagnosis not present

## 2015-10-09 DIAGNOSIS — D509 Iron deficiency anemia, unspecified: Secondary | ICD-10-CM | POA: Diagnosis not present

## 2015-10-11 DIAGNOSIS — D509 Iron deficiency anemia, unspecified: Secondary | ICD-10-CM | POA: Diagnosis not present

## 2015-10-11 DIAGNOSIS — D631 Anemia in chronic kidney disease: Secondary | ICD-10-CM | POA: Diagnosis not present

## 2015-10-11 DIAGNOSIS — N2581 Secondary hyperparathyroidism of renal origin: Secondary | ICD-10-CM | POA: Diagnosis not present

## 2015-10-11 DIAGNOSIS — N186 End stage renal disease: Secondary | ICD-10-CM | POA: Diagnosis not present

## 2015-10-15 DIAGNOSIS — D631 Anemia in chronic kidney disease: Secondary | ICD-10-CM | POA: Diagnosis not present

## 2015-10-15 DIAGNOSIS — D509 Iron deficiency anemia, unspecified: Secondary | ICD-10-CM | POA: Diagnosis not present

## 2015-10-15 DIAGNOSIS — N186 End stage renal disease: Secondary | ICD-10-CM | POA: Diagnosis not present

## 2015-10-15 DIAGNOSIS — N2581 Secondary hyperparathyroidism of renal origin: Secondary | ICD-10-CM | POA: Diagnosis not present

## 2015-10-16 DIAGNOSIS — D509 Iron deficiency anemia, unspecified: Secondary | ICD-10-CM | POA: Diagnosis not present

## 2015-10-16 DIAGNOSIS — N186 End stage renal disease: Secondary | ICD-10-CM | POA: Diagnosis not present

## 2015-10-16 DIAGNOSIS — D631 Anemia in chronic kidney disease: Secondary | ICD-10-CM | POA: Diagnosis not present

## 2015-10-16 DIAGNOSIS — N2581 Secondary hyperparathyroidism of renal origin: Secondary | ICD-10-CM | POA: Diagnosis not present

## 2015-10-17 ENCOUNTER — Emergency Department (HOSPITAL_COMMUNITY)
Admission: EM | Admit: 2015-10-17 | Discharge: 2015-10-17 | Disposition: A | Discharge status: Home / Self Care | Payer: Medicare Other | Attending: Emergency Medicine | Admitting: Emergency Medicine

## 2015-10-17 ENCOUNTER — Encounter (HOSPITAL_COMMUNITY): Payer: Self-pay | Admitting: Emergency Medicine

## 2015-10-17 DIAGNOSIS — N939 Abnormal uterine and vaginal bleeding, unspecified: Secondary | ICD-10-CM | POA: Insufficient documentation

## 2015-10-17 DIAGNOSIS — Z79899 Other long term (current) drug therapy: Secondary | ICD-10-CM | POA: Insufficient documentation

## 2015-10-17 DIAGNOSIS — N186 End stage renal disease: Secondary | ICD-10-CM | POA: Diagnosis not present

## 2015-10-17 DIAGNOSIS — Z8719 Personal history of other diseases of the digestive system: Secondary | ICD-10-CM | POA: Diagnosis not present

## 2015-10-17 DIAGNOSIS — Z992 Dependence on renal dialysis: Secondary | ICD-10-CM | POA: Insufficient documentation

## 2015-10-17 DIAGNOSIS — I120 Hypertensive chronic kidney disease with stage 5 chronic kidney disease or end stage renal disease: Secondary | ICD-10-CM | POA: Insufficient documentation

## 2015-10-17 DIAGNOSIS — Z940 Kidney transplant status: Secondary | ICD-10-CM | POA: Diagnosis not present

## 2015-10-17 DIAGNOSIS — Z3202 Encounter for pregnancy test, result negative: Secondary | ICD-10-CM | POA: Insufficient documentation

## 2015-10-17 LAB — SAMPLE TO BLOOD BANK

## 2015-10-17 LAB — CBC WITH DIFFERENTIAL/PLATELET
Basophils Absolute: 0 10*3/uL (ref 0.0–0.1)
Basophils Relative: 1 %
Eosinophils Absolute: 0.2 10*3/uL (ref 0.0–0.7)
Eosinophils Relative: 5 %
HCT: 37.1 % (ref 36.0–46.0)
Hemoglobin: 11.2 g/dL — ABNORMAL LOW (ref 12.0–15.0)
Lymphocytes Relative: 16 %
Lymphs Abs: 0.7 10*3/uL (ref 0.7–4.0)
MCH: 27 pg (ref 26.0–34.0)
MCHC: 30.2 g/dL (ref 30.0–36.0)
MCV: 89.4 fL (ref 78.0–100.0)
Monocytes Absolute: 0.4 10*3/uL (ref 0.1–1.0)
Monocytes Relative: 10 %
Neutro Abs: 3 10*3/uL (ref 1.7–7.7)
Neutrophils Relative %: 68 %
Platelets: 246 10*3/uL (ref 150–400)
RBC: 4.15 MIL/uL (ref 3.87–5.11)
RDW: 18.8 % — ABNORMAL HIGH (ref 11.5–15.5)
WBC: 4.3 10*3/uL (ref 4.0–10.5)

## 2015-10-17 LAB — COMPREHENSIVE METABOLIC PANEL
ALT: 53 U/L (ref 14–54)
AST: 87 U/L — ABNORMAL HIGH (ref 15–41)
Albumin: 3.3 g/dL — ABNORMAL LOW (ref 3.5–5.0)
Alkaline Phosphatase: 157 U/L — ABNORMAL HIGH (ref 38–126)
Anion gap: 14 (ref 5–15)
BUN: 12 mg/dL (ref 6–20)
CO2: 30 mmol/L (ref 22–32)
Calcium: 10 mg/dL (ref 8.9–10.3)
Chloride: 95 mmol/L — ABNORMAL LOW (ref 101–111)
Creatinine, Ser: 4.1 mg/dL — ABNORMAL HIGH (ref 0.44–1.00)
GFR calc Af Amer: 15 mL/min — ABNORMAL LOW (ref >60)
GFR calc non Af Amer: 13 mL/min — ABNORMAL LOW (ref >60)
Glucose, Bld: 117 mg/dL — ABNORMAL HIGH (ref 65–99)
Potassium: 5.1 mmol/L (ref 3.5–5.1)
Sodium: 139 mmol/L (ref 135–145)
Total Bilirubin: 0.4 mg/dL (ref 0.3–1.2)
Total Protein: 7.8 g/dL (ref 6.5–8.1)

## 2015-10-17 LAB — I-STAT BETA HCG BLOOD, ED (MC, WL, AP ONLY): I-stat hCG, quantitative: <5 m[IU]/mL (ref <5)

## 2015-10-17 NOTE — ED Notes (Signed)
Pt. reports heavy vaginal bleeding onset last night 8pm , LMP 5days ago , denies abdominal pain , no fever or chills . No dysuria.

## 2015-10-17 NOTE — Discharge Instructions (Signed)
Continue taking her home medication as prescribed and going to your scheduled dialysis. Call Dr. Domenic Schwab office this morning to schedule a follow up appointment.  Please return to the Emergency Department if symptoms worsen or new onset of fever, lightheadedness, dizziness, shortness of breath, abdominal pain, vomiting, diarrhea, vaginal bleeding, syncope.

## 2015-10-17 NOTE — ED Provider Notes (Signed)
CSN: [id number]     Arrival date & time 10/17/15  0120 History   First MD Initiated Contact with Patient 10/17/15 [phone]     Chief Complaint  Patient presents with  . Vaginal Bleeding     (Consider location/radiation/quality/duration/timing/severity/associated sxs/prior Treatment) HPI   Patient is a 40 year old female with past medical history of hypertension, renal transplant and ESRD (dialysis M/W/F) who presents the ED with complaint of vaginal bleeding. Patient reports she had a LEEP procedure done on 2/9 by Dr. Elly Modena due to having multiple abnormal paps. She notes after the procedure she started having a small amount of vaginal bleeding which she reports as mild spotting. However around 9pm last night she began having heavier bleeding and reports passing multiple clots. Denies fever, chills, headache, cough, shortness of breath, chest pain, palpitations, abdominal pain, nausea, vomiting, diarrhea, urinary symptoms, vaginal discharge, numbness, tingling, weakness, lightheadedness. Patient reports that her last period was 5 months ago.  Past Medical History  Diagnosis Date  . Dialysis patient ([place])   . Hypertension   . History of renal transplant 10/11/11  . GERD (gastroesophageal reflux disease)   . End stage renal disease ([place])   . Chronic glomerulonephritis    Past Surgical History  Procedure Laterality Date  . Diaylsis shunt    . Kidney transplant  10-11-2011    deceased donor kidney  . Colposcopy  2016   Family History  Problem Relation Age of Onset  . Diabetes Father   . Diabetes Paternal Aunt   . Alcohol abuse Paternal Uncle   . Cancer Maternal Grandmother     breast  . Diabetes Paternal Grandmother   . Alcohol abuse Paternal Grandmother   . Alcohol abuse Paternal Grandfather    Social History  Substance Use Topics  . Smoking status: Never Smoker   . Smokeless tobacco: Never Used  . Alcohol Use: No   OB History    Gravida Para Term Preterm AB TAB SAB Ectopic  Multiple Living   4 3 2 1 1   1  3      Review of Systems  Genitourinary: Positive for vaginal bleeding.  All other systems reviewed and are negative.     Allergies  Review of patient's allergies indicates no known allergies.  Home Medications   Prior to Admission medications   Medication Sig Start Date End Date Taking? Authorizing Provider  amLODipine (NORVASC) 10 MG tablet Take 10 mg by mouth every evening. 07/10/15  Yes Historical Provider, MD  labetalol (NORMODYNE) 300 MG tablet Take 300 mg by mouth 3 (three) times daily. 09/25/14  Yes Historical Provider, MD  sevelamer (RENAGEL) 800 MG tablet Take 3,200 mg by mouth 3 (three) times daily with meals.   Yes Historical Provider, MD  ciprofloxacin (CIPRO) 250 MG tablet Take 1 tablet (250 mg total) by mouth at bedtime. Patient not taking: Reported on 10/17/2015 09/26/15   Archie Patten, MD  Darbepoetin Alfa (ARANESP) 200 MCG/0.4ML SOSY injection Inject 0.4 mLs (200 mcg total) into the vein every Tuesday with hemodialysis. Patient not taking: Reported on 10/17/2015 09/20/14   Virginia Crews, MD  multivitamin (RENA-VIT) TABS tablet Take 1 tablet by mouth at bedtime. Patient not taking: Reported on 10/03/2015 09/20/14   Virginia Crews, MD  predniSONE (DELTASONE) 10 MG tablet Take 3 tablets or 30 mg by mouth daily for 7 days. Patient not taking: Reported on 10/17/2015 02/04/15   Pamella Pert, MD   BP 135/103 mmHg  Pulse 85  Temp(Src) 98.2  F (36.8 C) (Oral)  Resp 14  Ht 5\' 7"  (1.702 m)  Wt 54.432 kg  BMI 18.79 kg/m2  SpO2 100%  LMP 10/12/2015 Physical Exam  Constitutional: She is oriented to person, place, and time. She appears well-developed and well-nourished.  HENT:  Head: Normocephalic and atraumatic.  Mouth/Throat: Oropharynx is clear and moist. No oropharyngeal exudate.  Eyes: Conjunctivae and EOM are normal. Right eye exhibits no discharge. Left eye exhibits no discharge. No scleral icterus.  Neck: Normal range of  motion. Neck supple.  Cardiovascular: Normal rate, regular rhythm, normal heart sounds and intact distal pulses.   Pulmonary/Chest: Effort normal and breath sounds normal. No respiratory distress. She has no wheezes. She has no rales. She exhibits no tenderness.  Abdominal: Soft. Bowel sounds are normal. She exhibits no distension and no mass. There is no tenderness. There is no rebound and no guarding.  Musculoskeletal: Normal range of motion. She exhibits no edema.  Lymphadenopathy:    She has no cervical adenopathy.  Neurological: She is alert and oriented to person, place, and time.  Skin: Skin is warm and dry.  Nursing note and vitals reviewed.  Pelvic exam: normal external genitalia, vulva, vagina, cervix, uterus and adnexa, VULVA: normal appearing vulva with no masses, tenderness or lesions, VAGINA: normal appearing vagina with normal color, no lesions, small amount of blood noted in vaginal vault, CERVIX: normal appearing cervix without discharge or lesions, well-healing excision wound on cervix with no active bleeding, UTERUS: uterus is normal size, shape, consistency and nontender, ADNEXA: normal adnexa in size, nontender and no masses, exam chaperoned by female tech.   ED Course  Procedures (including critical care time) Labs Review Labs Reviewed  CBC WITH DIFFERENTIAL/PLATELET - Abnormal; Notable for the following:    Hemoglobin 11.2 (*)    RDW 18.8 (*)    All other components within normal limits  COMPREHENSIVE METABOLIC PANEL - Abnormal; Notable for the following:    Chloride 95 (*)    Glucose, Bld 117 (*)    Creatinine, Ser 4.10 (*)    Albumin 3.3 (*)    AST 87 (*)    Alkaline Phosphatase 157 (*)    GFR calc non Af Amer 13 (*)    GFR calc Af Amer 15 (*)    All other components within normal limits  URINALYSIS, ROUTINE W REFLEX MICROSCOPIC (NOT AT [facility])  I-STAT BETA HCG BLOOD, ED ([facility], [facility], [facility] ONLY)  SAMPLE TO BLOOD BANK    Imaging Review No results found. I have  personally reviewed and evaluated these images and lab results as part of my medical decision-making.    MDM   Final diagnoses:  Vaginal bleeding    Patient presents with vaginal bleeding status post LEEP procedure on 10/03/15 by Dr. Elly Modena. Patient reports since arrival to the ED her bleeding has resolved. VSS. Exam unremarkable. Pelvic exam revealed small amount of blood in vaginal vault, no CMT, no adnexal tenderness, remaining exam unremarkable.   Hemoglobin 11.2, patient reports her hemoglobin typically runs around 11 at baseline. Pregnancy negative. On reevaluation patient denies any pain or complaints and states her bleeding has resolved. Patient has remained hemodynamically stable while on the ED. I suspect patient's vaginal bleeding is associated with her recent procedure however I feel patient is appropriate to be discharged home with outpatient follow-up at this time. Plan to discharge patient home with symptomatic treatment. Advised patient to follow up with Dr. Elly Modena in the next 1-2 days.   Evaluation does not  show pathology requring ongoing emergent intervention or admission. Pt is hemodynamically stable and mentating appropriately. Discussed findings/results and plan with patient/guardian, who agrees with plan. All questions answered. Return precautions discussed and outpatient follow up given.      Chesley Noon [place], [place] 10/17/15 [place], MD 10/18/15 [phone]

## 2015-10-18 DIAGNOSIS — D509 Iron deficiency anemia, unspecified: Secondary | ICD-10-CM | POA: Diagnosis not present

## 2015-10-18 DIAGNOSIS — N2581 Secondary hyperparathyroidism of renal origin: Secondary | ICD-10-CM | POA: Diagnosis not present

## 2015-10-18 DIAGNOSIS — D631 Anemia in chronic kidney disease: Secondary | ICD-10-CM | POA: Diagnosis not present

## 2015-10-18 DIAGNOSIS — N186 End stage renal disease: Secondary | ICD-10-CM | POA: Diagnosis not present

## 2015-10-21 DIAGNOSIS — D631 Anemia in chronic kidney disease: Secondary | ICD-10-CM | POA: Diagnosis not present

## 2015-10-21 DIAGNOSIS — N2581 Secondary hyperparathyroidism of renal origin: Secondary | ICD-10-CM | POA: Diagnosis not present

## 2015-10-21 DIAGNOSIS — N186 End stage renal disease: Secondary | ICD-10-CM | POA: Diagnosis not present

## 2015-10-21 DIAGNOSIS — D509 Iron deficiency anemia, unspecified: Secondary | ICD-10-CM | POA: Diagnosis not present

## 2015-10-22 DIAGNOSIS — N186 End stage renal disease: Secondary | ICD-10-CM | POA: Diagnosis not present

## 2015-10-22 DIAGNOSIS — I129 Hypertensive chronic kidney disease with stage 1 through stage 4 chronic kidney disease, or unspecified chronic kidney disease: Secondary | ICD-10-CM | POA: Diagnosis not present

## 2015-10-22 DIAGNOSIS — Z992 Dependence on renal dialysis: Secondary | ICD-10-CM | POA: Diagnosis not present

## 2015-10-23 DIAGNOSIS — D509 Iron deficiency anemia, unspecified: Secondary | ICD-10-CM | POA: Diagnosis not present

## 2015-10-23 DIAGNOSIS — N186 End stage renal disease: Secondary | ICD-10-CM | POA: Diagnosis not present

## 2015-10-23 DIAGNOSIS — N2581 Secondary hyperparathyroidism of renal origin: Secondary | ICD-10-CM | POA: Diagnosis not present

## 2015-10-23 DIAGNOSIS — D631 Anemia in chronic kidney disease: Secondary | ICD-10-CM | POA: Diagnosis not present

## 2015-10-25 DIAGNOSIS — D631 Anemia in chronic kidney disease: Secondary | ICD-10-CM | POA: Diagnosis not present

## 2015-10-25 DIAGNOSIS — D509 Iron deficiency anemia, unspecified: Secondary | ICD-10-CM | POA: Diagnosis not present

## 2015-10-25 DIAGNOSIS — N186 End stage renal disease: Secondary | ICD-10-CM | POA: Diagnosis not present

## 2015-10-25 DIAGNOSIS — N2581 Secondary hyperparathyroidism of renal origin: Secondary | ICD-10-CM | POA: Diagnosis not present

## 2015-10-28 DIAGNOSIS — N2581 Secondary hyperparathyroidism of renal origin: Secondary | ICD-10-CM | POA: Diagnosis not present

## 2015-10-28 DIAGNOSIS — N186 End stage renal disease: Secondary | ICD-10-CM | POA: Diagnosis not present

## 2015-10-28 DIAGNOSIS — D509 Iron deficiency anemia, unspecified: Secondary | ICD-10-CM | POA: Diagnosis not present

## 2015-10-28 DIAGNOSIS — D631 Anemia in chronic kidney disease: Secondary | ICD-10-CM | POA: Diagnosis not present

## 2015-10-31 DIAGNOSIS — N2581 Secondary hyperparathyroidism of renal origin: Secondary | ICD-10-CM | POA: Diagnosis not present

## 2015-10-31 DIAGNOSIS — D509 Iron deficiency anemia, unspecified: Secondary | ICD-10-CM | POA: Diagnosis not present

## 2015-10-31 DIAGNOSIS — D631 Anemia in chronic kidney disease: Secondary | ICD-10-CM | POA: Diagnosis not present

## 2015-10-31 DIAGNOSIS — N186 End stage renal disease: Secondary | ICD-10-CM | POA: Diagnosis not present

## 2015-11-01 DIAGNOSIS — D509 Iron deficiency anemia, unspecified: Secondary | ICD-10-CM | POA: Diagnosis not present

## 2015-11-01 DIAGNOSIS — N2581 Secondary hyperparathyroidism of renal origin: Secondary | ICD-10-CM | POA: Diagnosis not present

## 2015-11-01 DIAGNOSIS — D631 Anemia in chronic kidney disease: Secondary | ICD-10-CM | POA: Diagnosis not present

## 2015-11-01 DIAGNOSIS — N186 End stage renal disease: Secondary | ICD-10-CM | POA: Diagnosis not present

## 2015-11-04 DIAGNOSIS — D631 Anemia in chronic kidney disease: Secondary | ICD-10-CM | POA: Diagnosis not present

## 2015-11-04 DIAGNOSIS — D509 Iron deficiency anemia, unspecified: Secondary | ICD-10-CM | POA: Diagnosis not present

## 2015-11-04 DIAGNOSIS — N2581 Secondary hyperparathyroidism of renal origin: Secondary | ICD-10-CM | POA: Diagnosis not present

## 2015-11-04 DIAGNOSIS — N186 End stage renal disease: Secondary | ICD-10-CM | POA: Diagnosis not present

## 2015-11-08 DIAGNOSIS — N2581 Secondary hyperparathyroidism of renal origin: Secondary | ICD-10-CM | POA: Diagnosis not present

## 2015-11-08 DIAGNOSIS — D631 Anemia in chronic kidney disease: Secondary | ICD-10-CM | POA: Diagnosis not present

## 2015-11-08 DIAGNOSIS — D509 Iron deficiency anemia, unspecified: Secondary | ICD-10-CM | POA: Diagnosis not present

## 2015-11-08 DIAGNOSIS — N186 End stage renal disease: Secondary | ICD-10-CM | POA: Diagnosis not present

## 2015-11-11 DIAGNOSIS — N186 End stage renal disease: Secondary | ICD-10-CM | POA: Diagnosis not present

## 2015-11-11 DIAGNOSIS — D631 Anemia in chronic kidney disease: Secondary | ICD-10-CM | POA: Diagnosis not present

## 2015-11-11 DIAGNOSIS — N2581 Secondary hyperparathyroidism of renal origin: Secondary | ICD-10-CM | POA: Diagnosis not present

## 2015-11-11 DIAGNOSIS — D509 Iron deficiency anemia, unspecified: Secondary | ICD-10-CM | POA: Diagnosis not present

## 2015-11-13 DIAGNOSIS — D631 Anemia in chronic kidney disease: Secondary | ICD-10-CM | POA: Diagnosis not present

## 2015-11-13 DIAGNOSIS — D509 Iron deficiency anemia, unspecified: Secondary | ICD-10-CM | POA: Diagnosis not present

## 2015-11-13 DIAGNOSIS — N186 End stage renal disease: Secondary | ICD-10-CM | POA: Diagnosis not present

## 2015-11-13 DIAGNOSIS — N2581 Secondary hyperparathyroidism of renal origin: Secondary | ICD-10-CM | POA: Diagnosis not present

## 2015-11-15 DIAGNOSIS — D509 Iron deficiency anemia, unspecified: Secondary | ICD-10-CM | POA: Diagnosis not present

## 2015-11-15 DIAGNOSIS — N186 End stage renal disease: Secondary | ICD-10-CM | POA: Diagnosis not present

## 2015-11-15 DIAGNOSIS — N2581 Secondary hyperparathyroidism of renal origin: Secondary | ICD-10-CM | POA: Diagnosis not present

## 2015-11-15 DIAGNOSIS — D631 Anemia in chronic kidney disease: Secondary | ICD-10-CM | POA: Diagnosis not present

## 2015-11-18 DIAGNOSIS — N186 End stage renal disease: Secondary | ICD-10-CM | POA: Diagnosis not present

## 2015-11-18 DIAGNOSIS — N2581 Secondary hyperparathyroidism of renal origin: Secondary | ICD-10-CM | POA: Diagnosis not present

## 2015-11-18 DIAGNOSIS — D631 Anemia in chronic kidney disease: Secondary | ICD-10-CM | POA: Diagnosis not present

## 2015-11-18 DIAGNOSIS — D509 Iron deficiency anemia, unspecified: Secondary | ICD-10-CM | POA: Diagnosis not present

## 2015-11-20 DIAGNOSIS — N2581 Secondary hyperparathyroidism of renal origin: Secondary | ICD-10-CM | POA: Diagnosis not present

## 2015-11-20 DIAGNOSIS — D631 Anemia in chronic kidney disease: Secondary | ICD-10-CM | POA: Diagnosis not present

## 2015-11-20 DIAGNOSIS — N186 End stage renal disease: Secondary | ICD-10-CM | POA: Diagnosis not present

## 2015-11-20 DIAGNOSIS — D509 Iron deficiency anemia, unspecified: Secondary | ICD-10-CM | POA: Diagnosis not present

## 2015-11-22 DIAGNOSIS — I129 Hypertensive chronic kidney disease with stage 1 through stage 4 chronic kidney disease, or unspecified chronic kidney disease: Secondary | ICD-10-CM | POA: Diagnosis not present

## 2015-11-22 DIAGNOSIS — Z992 Dependence on renal dialysis: Secondary | ICD-10-CM | POA: Diagnosis not present

## 2015-11-22 DIAGNOSIS — D631 Anemia in chronic kidney disease: Secondary | ICD-10-CM | POA: Diagnosis not present

## 2015-11-22 DIAGNOSIS — N186 End stage renal disease: Secondary | ICD-10-CM | POA: Diagnosis not present

## 2015-11-22 DIAGNOSIS — N2581 Secondary hyperparathyroidism of renal origin: Secondary | ICD-10-CM | POA: Diagnosis not present

## 2015-11-22 DIAGNOSIS — D509 Iron deficiency anemia, unspecified: Secondary | ICD-10-CM | POA: Diagnosis not present

## 2015-11-25 DIAGNOSIS — N2581 Secondary hyperparathyroidism of renal origin: Secondary | ICD-10-CM | POA: Diagnosis not present

## 2015-11-25 DIAGNOSIS — N186 End stage renal disease: Secondary | ICD-10-CM | POA: Diagnosis not present

## 2015-11-25 DIAGNOSIS — D509 Iron deficiency anemia, unspecified: Secondary | ICD-10-CM | POA: Diagnosis not present

## 2015-11-27 DIAGNOSIS — N186 End stage renal disease: Secondary | ICD-10-CM | POA: Diagnosis not present

## 2015-11-27 DIAGNOSIS — N2581 Secondary hyperparathyroidism of renal origin: Secondary | ICD-10-CM | POA: Diagnosis not present

## 2015-11-27 DIAGNOSIS — D509 Iron deficiency anemia, unspecified: Secondary | ICD-10-CM | POA: Diagnosis not present

## 2015-11-29 DIAGNOSIS — N186 End stage renal disease: Secondary | ICD-10-CM | POA: Diagnosis not present

## 2015-11-29 DIAGNOSIS — N2581 Secondary hyperparathyroidism of renal origin: Secondary | ICD-10-CM | POA: Diagnosis not present

## 2015-11-29 DIAGNOSIS — D509 Iron deficiency anemia, unspecified: Secondary | ICD-10-CM | POA: Diagnosis not present

## 2015-12-02 DIAGNOSIS — D509 Iron deficiency anemia, unspecified: Secondary | ICD-10-CM | POA: Diagnosis not present

## 2015-12-02 DIAGNOSIS — N2581 Secondary hyperparathyroidism of renal origin: Secondary | ICD-10-CM | POA: Diagnosis not present

## 2015-12-02 DIAGNOSIS — N186 End stage renal disease: Secondary | ICD-10-CM | POA: Diagnosis not present

## 2015-12-04 DIAGNOSIS — N2581 Secondary hyperparathyroidism of renal origin: Secondary | ICD-10-CM | POA: Diagnosis not present

## 2015-12-04 DIAGNOSIS — N186 End stage renal disease: Secondary | ICD-10-CM | POA: Diagnosis not present

## 2015-12-04 DIAGNOSIS — D509 Iron deficiency anemia, unspecified: Secondary | ICD-10-CM | POA: Diagnosis not present

## 2015-12-06 DIAGNOSIS — D509 Iron deficiency anemia, unspecified: Secondary | ICD-10-CM | POA: Diagnosis not present

## 2015-12-06 DIAGNOSIS — N186 End stage renal disease: Secondary | ICD-10-CM | POA: Diagnosis not present

## 2015-12-06 DIAGNOSIS — N2581 Secondary hyperparathyroidism of renal origin: Secondary | ICD-10-CM | POA: Diagnosis not present

## 2015-12-09 DIAGNOSIS — D509 Iron deficiency anemia, unspecified: Secondary | ICD-10-CM | POA: Diagnosis not present

## 2015-12-09 DIAGNOSIS — N2581 Secondary hyperparathyroidism of renal origin: Secondary | ICD-10-CM | POA: Diagnosis not present

## 2015-12-09 DIAGNOSIS — N186 End stage renal disease: Secondary | ICD-10-CM | POA: Diagnosis not present

## 2015-12-11 DIAGNOSIS — N2581 Secondary hyperparathyroidism of renal origin: Secondary | ICD-10-CM | POA: Diagnosis not present

## 2015-12-11 DIAGNOSIS — N186 End stage renal disease: Secondary | ICD-10-CM | POA: Diagnosis not present

## 2015-12-11 DIAGNOSIS — D509 Iron deficiency anemia, unspecified: Secondary | ICD-10-CM | POA: Diagnosis not present

## 2015-12-13 DIAGNOSIS — N2581 Secondary hyperparathyroidism of renal origin: Secondary | ICD-10-CM | POA: Diagnosis not present

## 2015-12-13 DIAGNOSIS — D509 Iron deficiency anemia, unspecified: Secondary | ICD-10-CM | POA: Diagnosis not present

## 2015-12-13 DIAGNOSIS — N186 End stage renal disease: Secondary | ICD-10-CM | POA: Diagnosis not present

## 2015-12-16 DIAGNOSIS — N2581 Secondary hyperparathyroidism of renal origin: Secondary | ICD-10-CM | POA: Diagnosis not present

## 2015-12-16 DIAGNOSIS — N186 End stage renal disease: Secondary | ICD-10-CM | POA: Diagnosis not present

## 2015-12-16 DIAGNOSIS — D509 Iron deficiency anemia, unspecified: Secondary | ICD-10-CM | POA: Diagnosis not present

## 2015-12-18 DIAGNOSIS — N2581 Secondary hyperparathyroidism of renal origin: Secondary | ICD-10-CM | POA: Diagnosis not present

## 2015-12-18 DIAGNOSIS — N186 End stage renal disease: Secondary | ICD-10-CM | POA: Diagnosis not present

## 2015-12-18 DIAGNOSIS — D509 Iron deficiency anemia, unspecified: Secondary | ICD-10-CM | POA: Diagnosis not present

## 2015-12-20 DIAGNOSIS — N186 End stage renal disease: Secondary | ICD-10-CM | POA: Diagnosis not present

## 2015-12-20 DIAGNOSIS — N2581 Secondary hyperparathyroidism of renal origin: Secondary | ICD-10-CM | POA: Diagnosis not present

## 2015-12-20 DIAGNOSIS — D509 Iron deficiency anemia, unspecified: Secondary | ICD-10-CM | POA: Diagnosis not present

## 2015-12-22 DIAGNOSIS — N186 End stage renal disease: Secondary | ICD-10-CM | POA: Diagnosis not present

## 2015-12-22 DIAGNOSIS — I129 Hypertensive chronic kidney disease with stage 1 through stage 4 chronic kidney disease, or unspecified chronic kidney disease: Secondary | ICD-10-CM | POA: Diagnosis not present

## 2015-12-22 DIAGNOSIS — Z992 Dependence on renal dialysis: Secondary | ICD-10-CM | POA: Diagnosis not present

## 2015-12-23 DIAGNOSIS — D509 Iron deficiency anemia, unspecified: Secondary | ICD-10-CM | POA: Diagnosis not present

## 2015-12-23 DIAGNOSIS — N2581 Secondary hyperparathyroidism of renal origin: Secondary | ICD-10-CM | POA: Diagnosis not present

## 2015-12-23 DIAGNOSIS — D631 Anemia in chronic kidney disease: Secondary | ICD-10-CM | POA: Diagnosis not present

## 2015-12-23 DIAGNOSIS — N186 End stage renal disease: Secondary | ICD-10-CM | POA: Diagnosis not present

## 2015-12-25 DIAGNOSIS — D631 Anemia in chronic kidney disease: Secondary | ICD-10-CM | POA: Diagnosis not present

## 2015-12-25 DIAGNOSIS — N186 End stage renal disease: Secondary | ICD-10-CM | POA: Diagnosis not present

## 2015-12-25 DIAGNOSIS — D509 Iron deficiency anemia, unspecified: Secondary | ICD-10-CM | POA: Diagnosis not present

## 2015-12-25 DIAGNOSIS — N2581 Secondary hyperparathyroidism of renal origin: Secondary | ICD-10-CM | POA: Diagnosis not present

## 2015-12-27 DIAGNOSIS — D509 Iron deficiency anemia, unspecified: Secondary | ICD-10-CM | POA: Diagnosis not present

## 2015-12-27 DIAGNOSIS — D631 Anemia in chronic kidney disease: Secondary | ICD-10-CM | POA: Diagnosis not present

## 2015-12-27 DIAGNOSIS — N2581 Secondary hyperparathyroidism of renal origin: Secondary | ICD-10-CM | POA: Diagnosis not present

## 2015-12-27 DIAGNOSIS — N186 End stage renal disease: Secondary | ICD-10-CM | POA: Diagnosis not present

## 2016-01-01 DIAGNOSIS — D631 Anemia in chronic kidney disease: Secondary | ICD-10-CM | POA: Diagnosis not present

## 2016-01-01 DIAGNOSIS — D509 Iron deficiency anemia, unspecified: Secondary | ICD-10-CM | POA: Diagnosis not present

## 2016-01-01 DIAGNOSIS — N186 End stage renal disease: Secondary | ICD-10-CM | POA: Diagnosis not present

## 2016-01-01 DIAGNOSIS — N2581 Secondary hyperparathyroidism of renal origin: Secondary | ICD-10-CM | POA: Diagnosis not present

## 2016-01-03 DIAGNOSIS — N186 End stage renal disease: Secondary | ICD-10-CM | POA: Diagnosis not present

## 2016-01-03 DIAGNOSIS — D631 Anemia in chronic kidney disease: Secondary | ICD-10-CM | POA: Diagnosis not present

## 2016-01-03 DIAGNOSIS — D509 Iron deficiency anemia, unspecified: Secondary | ICD-10-CM | POA: Diagnosis not present

## 2016-01-03 DIAGNOSIS — N2581 Secondary hyperparathyroidism of renal origin: Secondary | ICD-10-CM | POA: Diagnosis not present

## 2016-01-06 DIAGNOSIS — D509 Iron deficiency anemia, unspecified: Secondary | ICD-10-CM | POA: Diagnosis not present

## 2016-01-06 DIAGNOSIS — N186 End stage renal disease: Secondary | ICD-10-CM | POA: Diagnosis not present

## 2016-01-06 DIAGNOSIS — D631 Anemia in chronic kidney disease: Secondary | ICD-10-CM | POA: Diagnosis not present

## 2016-01-06 DIAGNOSIS — N2581 Secondary hyperparathyroidism of renal origin: Secondary | ICD-10-CM | POA: Diagnosis not present

## 2016-01-07 ENCOUNTER — Ambulatory Visit (INDEPENDENT_AMBULATORY_CARE_PROVIDER_SITE_OTHER): Payer: Medicare Other | Admitting: Family Medicine

## 2016-01-07 ENCOUNTER — Encounter: Payer: Self-pay | Admitting: Family Medicine

## 2016-01-07 VITALS — BP 140/89 | HR 84 | Temp 98.0°F | Wt 126.0 lb

## 2016-01-07 DIAGNOSIS — H6121 Impacted cerumen, right ear: Secondary | ICD-10-CM

## 2016-01-07 MED ORDER — CARBAMIDE PEROXIDE 6.5 % OT SOLN: 5.0000 [drp] | Freq: Two times a day (BID) | OTIC | Status: DC

## 2016-01-07 NOTE — Patient Instructions (Signed)
Cerumen Impaction The structures of the external ear canal secrete a waxy substance known as cerumen. Excess cerumen can build up in the ear canal, causing a condition known as cerumen impaction. Cerumen impaction can cause ear pain and disrupt the function of the ear. The rate of cerumen production differs for each individual. In certain individuals, the configuration of the ear canal may decrease his or her ability to naturally remove cerumen. CAUSES Cerumen impaction is caused by excessive cerumen production or buildup. RISK FACTORS  Frequent use of swabs to clean ears.  Having narrow ear canals.  Having eczema.  Being dehydrated. SIGNS AND SYMPTOMS  Diminished hearing.  Ear drainage.  Ear pain.  Ear itch. TREATMENT Treatment may involve:  Over-the-counter or prescription ear drops to soften the cerumen.  Removal of cerumen by a health care provider. This may be done with:  Irrigation with warm water. This is the most common method of removal.  Ear curettes and other instruments.  Surgery. This may be done in severe cases. HOME CARE INSTRUCTIONS  Take medicines only as directed by your health care provider.  Do not insert objects into the ear with the intent of cleaning the ear. PREVENTION  Do not insert objects into the ear, even with the intent of cleaning the ear. Removing cerumen as a part of normal hygiene is not necessary, and the use of swabs in the ear canal is not recommended.  Drink enough water to keep your urine clear or pale yellow.  Control your eczema if you have it. SEEK MEDICAL CARE IF:  You develop ear pain.  You develop bleeding from the ear.  The cerumen does not clear after you use ear drops as directed.   This information is not intended to replace advice given to you by your health care provider. Make sure you discuss any questions you have with your health care provider.   Document Released: 09/17/2004 Document Revised: 08/31/2014  Document Reviewed: 03/27/2015 Elsevier Interactive Patient Education 2016 [employer]  

## 2016-01-08 DIAGNOSIS — D631 Anemia in chronic kidney disease: Secondary | ICD-10-CM | POA: Diagnosis not present

## 2016-01-08 DIAGNOSIS — N2581 Secondary hyperparathyroidism of renal origin: Secondary | ICD-10-CM | POA: Diagnosis not present

## 2016-01-08 DIAGNOSIS — D509 Iron deficiency anemia, unspecified: Secondary | ICD-10-CM | POA: Diagnosis not present

## 2016-01-08 DIAGNOSIS — N186 End stage renal disease: Secondary | ICD-10-CM | POA: Diagnosis not present

## 2016-01-09 NOTE — Assessment & Plan Note (Signed)
Pt cleaning ear with fingernail and felt like she pushed something deeper and now can't hear on that side - Attempted to clear with curette but unable to clear completely - rx debrox drops, f/u next week to clear any remaining wax once softened and examine TM

## 2016-01-09 NOTE — Progress Notes (Signed)
   Subjective:   Leeonna Pusateri is a 40 y.o. female with a history of HTN, ESRD here for ear pain  EAR PAIN  Location: right ear Ear pain started: yesterday, after pt was cleaning it with her finger nail and felt like she pushed it back Pain is: dull ache Severity: mild Medications tried: none Recent ear trauma: possible, pushed back some wax with her finger nail Prior ear surgeries: no, has needed ENT referral for cerumen disimpaction in the past Antibiotics in the last 30 days: no History of diabetes: no  Symptoms Ear discharge: no Fever: no Pain with chewing: no Ringing in ears: no Dizziness: no Hearing loss: yes, right only Rashes or blisters around ear: no Weight loss: no  Review of Symptoms - see HPI PMH - Smoking status noted.     Objective:  BP 140/89 mmHg  Pulse 84  Temp(Src) 98 F (36.7 C) (Oral)  Wt 126 lb (57.153 kg)  Gen:  40 y.o. female in NAD HEENT: NCAT, MMM, anicteric sclerae, R canal with hardened wax obstructing canal, normal L TM CV: RRR, no MRG Resp: Non-labored, CTAB, no wheezes noted Abd: Soft, NTND, BS present, no guarding or organomegaly Ext: WWP, no edema Neuro: Alert and oriented, speech normal   Assessment & Plan:     Velmar Deadrick is a 40 y.o. female here for ear ache  Cerumen impaction Pt cleaning ear with fingernail and felt like she pushed something deeper and now can't hear on that side - Attempted to clear with curette but unable to clear completely - rx debrox drops, f/u next week to clear any remaining wax once softened and examine TM       Beverlyn Roux, MD, MPH [facility] PGY-3 01/09/2016 10:01 PM

## 2016-01-11 DIAGNOSIS — D509 Iron deficiency anemia, unspecified: Secondary | ICD-10-CM | POA: Diagnosis not present

## 2016-01-11 DIAGNOSIS — N2581 Secondary hyperparathyroidism of renal origin: Secondary | ICD-10-CM | POA: Diagnosis not present

## 2016-01-11 DIAGNOSIS — N186 End stage renal disease: Secondary | ICD-10-CM | POA: Diagnosis not present

## 2016-01-11 DIAGNOSIS — D631 Anemia in chronic kidney disease: Secondary | ICD-10-CM | POA: Diagnosis not present

## 2016-01-13 DIAGNOSIS — D509 Iron deficiency anemia, unspecified: Secondary | ICD-10-CM | POA: Diagnosis not present

## 2016-01-13 DIAGNOSIS — N186 End stage renal disease: Secondary | ICD-10-CM | POA: Diagnosis not present

## 2016-01-13 DIAGNOSIS — N2581 Secondary hyperparathyroidism of renal origin: Secondary | ICD-10-CM | POA: Diagnosis not present

## 2016-01-13 DIAGNOSIS — D631 Anemia in chronic kidney disease: Secondary | ICD-10-CM | POA: Diagnosis not present

## 2016-01-15 DIAGNOSIS — N186 End stage renal disease: Secondary | ICD-10-CM | POA: Diagnosis not present

## 2016-01-15 DIAGNOSIS — D509 Iron deficiency anemia, unspecified: Secondary | ICD-10-CM | POA: Diagnosis not present

## 2016-01-15 DIAGNOSIS — N2581 Secondary hyperparathyroidism of renal origin: Secondary | ICD-10-CM | POA: Diagnosis not present

## 2016-01-15 DIAGNOSIS — D631 Anemia in chronic kidney disease: Secondary | ICD-10-CM | POA: Diagnosis not present

## 2016-01-17 DIAGNOSIS — N186 End stage renal disease: Secondary | ICD-10-CM | POA: Diagnosis not present

## 2016-01-17 DIAGNOSIS — D631 Anemia in chronic kidney disease: Secondary | ICD-10-CM | POA: Diagnosis not present

## 2016-01-17 DIAGNOSIS — D509 Iron deficiency anemia, unspecified: Secondary | ICD-10-CM | POA: Diagnosis not present

## 2016-01-17 DIAGNOSIS — N2581 Secondary hyperparathyroidism of renal origin: Secondary | ICD-10-CM | POA: Diagnosis not present

## 2016-01-20 DIAGNOSIS — N2581 Secondary hyperparathyroidism of renal origin: Secondary | ICD-10-CM | POA: Diagnosis not present

## 2016-01-20 DIAGNOSIS — D509 Iron deficiency anemia, unspecified: Secondary | ICD-10-CM | POA: Diagnosis not present

## 2016-01-20 DIAGNOSIS — D631 Anemia in chronic kidney disease: Secondary | ICD-10-CM | POA: Diagnosis not present

## 2016-01-20 DIAGNOSIS — N186 End stage renal disease: Secondary | ICD-10-CM | POA: Diagnosis not present

## 2016-01-22 DIAGNOSIS — Z992 Dependence on renal dialysis: Secondary | ICD-10-CM | POA: Diagnosis not present

## 2016-01-22 DIAGNOSIS — N2581 Secondary hyperparathyroidism of renal origin: Secondary | ICD-10-CM | POA: Diagnosis not present

## 2016-01-22 DIAGNOSIS — D631 Anemia in chronic kidney disease: Secondary | ICD-10-CM | POA: Diagnosis not present

## 2016-01-22 DIAGNOSIS — D509 Iron deficiency anemia, unspecified: Secondary | ICD-10-CM | POA: Diagnosis not present

## 2016-01-22 DIAGNOSIS — N186 End stage renal disease: Secondary | ICD-10-CM | POA: Diagnosis not present

## 2016-01-22 DIAGNOSIS — I129 Hypertensive chronic kidney disease with stage 1 through stage 4 chronic kidney disease, or unspecified chronic kidney disease: Secondary | ICD-10-CM | POA: Diagnosis not present

## 2016-01-24 DIAGNOSIS — N186 End stage renal disease: Secondary | ICD-10-CM | POA: Diagnosis not present

## 2016-01-24 DIAGNOSIS — D509 Iron deficiency anemia, unspecified: Secondary | ICD-10-CM | POA: Diagnosis not present

## 2016-01-24 DIAGNOSIS — D631 Anemia in chronic kidney disease: Secondary | ICD-10-CM | POA: Diagnosis not present

## 2016-01-24 DIAGNOSIS — N2581 Secondary hyperparathyroidism of renal origin: Secondary | ICD-10-CM | POA: Diagnosis not present

## 2016-01-27 DIAGNOSIS — D509 Iron deficiency anemia, unspecified: Secondary | ICD-10-CM | POA: Diagnosis not present

## 2016-01-27 DIAGNOSIS — N186 End stage renal disease: Secondary | ICD-10-CM | POA: Diagnosis not present

## 2016-01-27 DIAGNOSIS — N2581 Secondary hyperparathyroidism of renal origin: Secondary | ICD-10-CM | POA: Diagnosis not present

## 2016-01-27 DIAGNOSIS — D631 Anemia in chronic kidney disease: Secondary | ICD-10-CM | POA: Diagnosis not present

## 2016-01-29 DIAGNOSIS — D631 Anemia in chronic kidney disease: Secondary | ICD-10-CM | POA: Diagnosis not present

## 2016-01-29 DIAGNOSIS — N2581 Secondary hyperparathyroidism of renal origin: Secondary | ICD-10-CM | POA: Diagnosis not present

## 2016-01-29 DIAGNOSIS — D509 Iron deficiency anemia, unspecified: Secondary | ICD-10-CM | POA: Diagnosis not present

## 2016-01-29 DIAGNOSIS — N186 End stage renal disease: Secondary | ICD-10-CM | POA: Diagnosis not present

## 2016-01-31 DIAGNOSIS — D631 Anemia in chronic kidney disease: Secondary | ICD-10-CM | POA: Diagnosis not present

## 2016-01-31 DIAGNOSIS — N186 End stage renal disease: Secondary | ICD-10-CM | POA: Diagnosis not present

## 2016-01-31 DIAGNOSIS — D509 Iron deficiency anemia, unspecified: Secondary | ICD-10-CM | POA: Diagnosis not present

## 2016-01-31 DIAGNOSIS — N2581 Secondary hyperparathyroidism of renal origin: Secondary | ICD-10-CM | POA: Diagnosis not present

## 2016-02-05 DIAGNOSIS — D509 Iron deficiency anemia, unspecified: Secondary | ICD-10-CM | POA: Diagnosis not present

## 2016-02-05 DIAGNOSIS — N2581 Secondary hyperparathyroidism of renal origin: Secondary | ICD-10-CM | POA: Diagnosis not present

## 2016-02-05 DIAGNOSIS — N186 End stage renal disease: Secondary | ICD-10-CM | POA: Diagnosis not present

## 2016-02-05 DIAGNOSIS — D631 Anemia in chronic kidney disease: Secondary | ICD-10-CM | POA: Diagnosis not present

## 2016-02-06 DIAGNOSIS — N186 End stage renal disease: Secondary | ICD-10-CM | POA: Diagnosis not present

## 2016-02-06 DIAGNOSIS — N2581 Secondary hyperparathyroidism of renal origin: Secondary | ICD-10-CM | POA: Diagnosis not present

## 2016-02-06 DIAGNOSIS — D509 Iron deficiency anemia, unspecified: Secondary | ICD-10-CM | POA: Diagnosis not present

## 2016-02-06 DIAGNOSIS — D631 Anemia in chronic kidney disease: Secondary | ICD-10-CM | POA: Diagnosis not present

## 2016-02-10 ENCOUNTER — Emergency Department (HOSPITAL_COMMUNITY): Payer: Medicare Other

## 2016-02-10 ENCOUNTER — Encounter (HOSPITAL_COMMUNITY): Payer: Self-pay

## 2016-02-10 ENCOUNTER — Emergency Department (HOSPITAL_COMMUNITY)
Admission: EM | Admit: 2016-02-10 | Discharge: 2016-02-10 | Disposition: A | Discharge status: Home / Self Care | Payer: Medicare Other | Attending: Emergency Medicine | Admitting: Emergency Medicine

## 2016-02-10 DIAGNOSIS — S82144A Nondisplaced bicondylar fracture of right tibia, initial encounter for closed fracture: Secondary | ICD-10-CM | POA: Diagnosis not present

## 2016-02-10 DIAGNOSIS — Y999 Unspecified external cause status: Secondary | ICD-10-CM | POA: Diagnosis not present

## 2016-02-10 DIAGNOSIS — N186 End stage renal disease: Secondary | ICD-10-CM | POA: Insufficient documentation

## 2016-02-10 DIAGNOSIS — I120 Hypertensive chronic kidney disease with stage 5 chronic kidney disease or end stage renal disease: Secondary | ICD-10-CM | POA: Insufficient documentation

## 2016-02-10 DIAGNOSIS — Y92832 Beach as the place of occurrence of the external cause: Secondary | ICD-10-CM | POA: Insufficient documentation

## 2016-02-10 DIAGNOSIS — W228XXA Striking against or struck by other objects, initial encounter: Secondary | ICD-10-CM | POA: Insufficient documentation

## 2016-02-10 DIAGNOSIS — S8991XA Unspecified injury of right lower leg, initial encounter: Secondary | ICD-10-CM | POA: Diagnosis present

## 2016-02-10 DIAGNOSIS — Y9389 Activity, other specified: Secondary | ICD-10-CM | POA: Diagnosis not present

## 2016-02-10 DIAGNOSIS — Z79899 Other long term (current) drug therapy: Secondary | ICD-10-CM | POA: Diagnosis not present

## 2016-02-10 DIAGNOSIS — Z9115 Patient's noncompliance with renal dialysis: Secondary | ICD-10-CM | POA: Diagnosis not present

## 2016-02-10 DIAGNOSIS — M25461 Effusion, right knee: Secondary | ICD-10-CM | POA: Diagnosis not present

## 2016-02-10 DIAGNOSIS — S82201A Unspecified fracture of shaft of right tibia, initial encounter for closed fracture: Secondary | ICD-10-CM

## 2016-02-10 LAB — BASIC METABOLIC PANEL
Anion gap: 16 — ABNORMAL HIGH (ref 5–15)
BUN: 49 mg/dL — ABNORMAL HIGH (ref 6–20)
CO2: 25 mmol/L (ref 22–32)
Calcium: 8.5 mg/dL — ABNORMAL LOW (ref 8.9–10.3)
Chloride: 98 mmol/L — ABNORMAL LOW (ref 101–111)
Creatinine, Ser: 14.7 mg/dL — ABNORMAL HIGH (ref 0.44–1.00)
GFR calc Af Amer: 3 mL/min — ABNORMAL LOW (ref >60)
GFR calc non Af Amer: 3 mL/min — ABNORMAL LOW (ref >60)
Glucose, Bld: 57 mg/dL — ABNORMAL LOW (ref 65–99)
Potassium: 4.7 mmol/L (ref 3.5–5.1)
Sodium: 139 mmol/L (ref 135–145)

## 2016-02-10 LAB — CBC WITH DIFFERENTIAL/PLATELET
Basophils Absolute: 0 10*3/uL (ref 0.0–0.1)
Basophils Relative: 0 %
Eosinophils Absolute: 0.1 10*3/uL (ref 0.0–0.7)
Eosinophils Relative: 2 %
HCT: 31.9 % — ABNORMAL LOW (ref 36.0–46.0)
Hemoglobin: 10 g/dL — ABNORMAL LOW (ref 12.0–15.0)
Lymphocytes Relative: 16 %
Lymphs Abs: 0.8 10*3/uL (ref 0.7–4.0)
MCH: 29.7 pg (ref 26.0–34.0)
MCHC: 31.3 g/dL (ref 30.0–36.0)
MCV: 94.7 fL (ref 78.0–100.0)
Monocytes Absolute: 0.3 10*3/uL (ref 0.1–1.0)
Monocytes Relative: 6 %
Neutro Abs: 3.7 10*3/uL (ref 1.7–7.7)
Neutrophils Relative %: 76 %
Platelets: 216 10*3/uL (ref 150–400)
RBC: 3.37 MIL/uL — ABNORMAL LOW (ref 3.87–5.11)
RDW: 17.1 % — ABNORMAL HIGH (ref 11.5–15.5)
WBC: 4.9 10*3/uL (ref 4.0–10.5)

## 2016-02-10 IMAGING — CR DG KNEE COMPLETE 4+V*R*
4 series · 4 of 4 positions shown · non-contrast
Comparison: None.

CLINICAL DATA: Initial encounter for Fall from [place] on [REDACTED] with minor pain initially, that worsened after a
few hours with significant swelling.

EXAM:
RIGHT KNEE - COMPLETE 4+ VIEW

[knee ap]
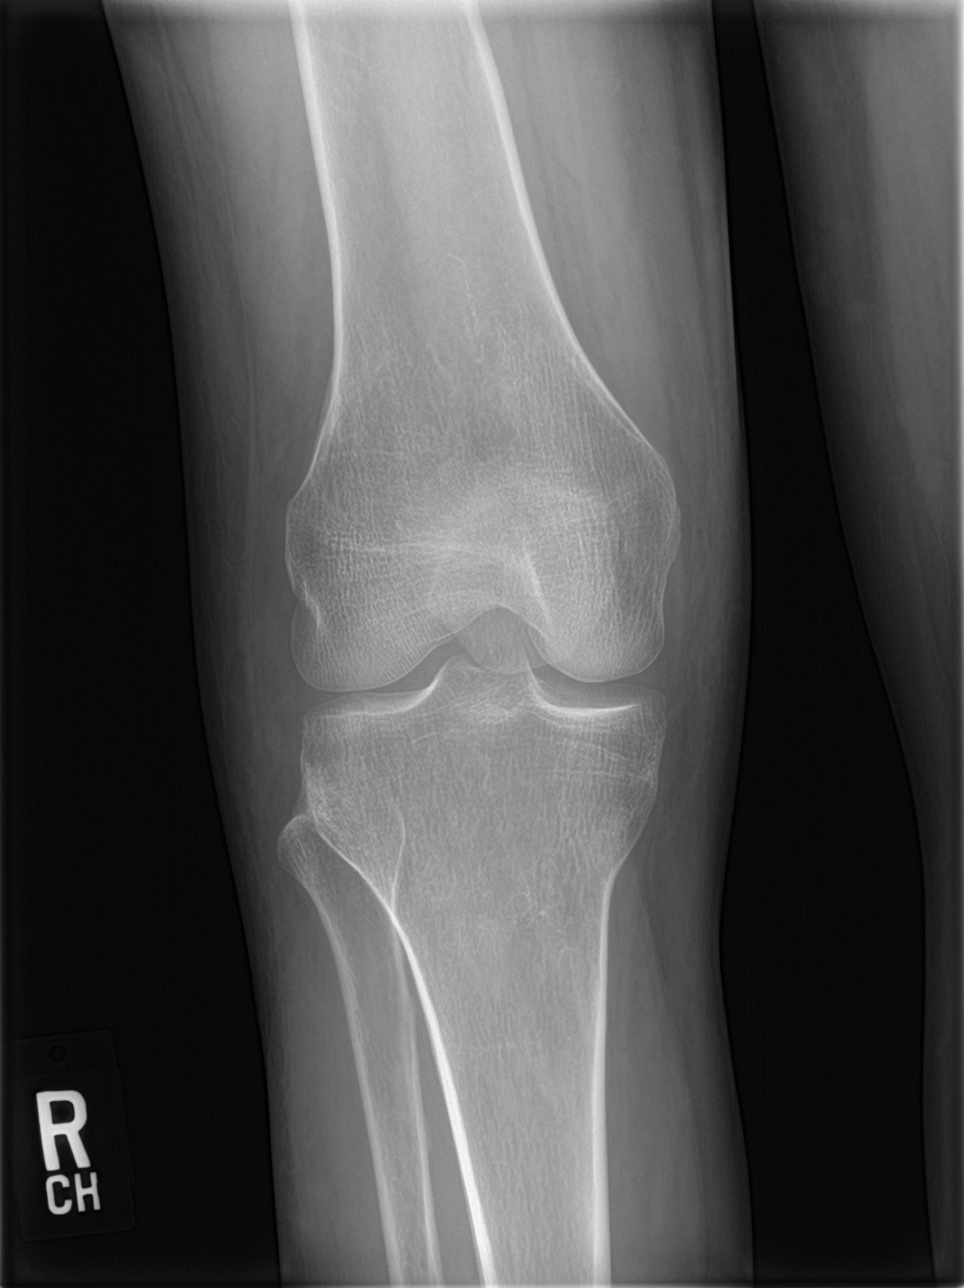

[knee lat]
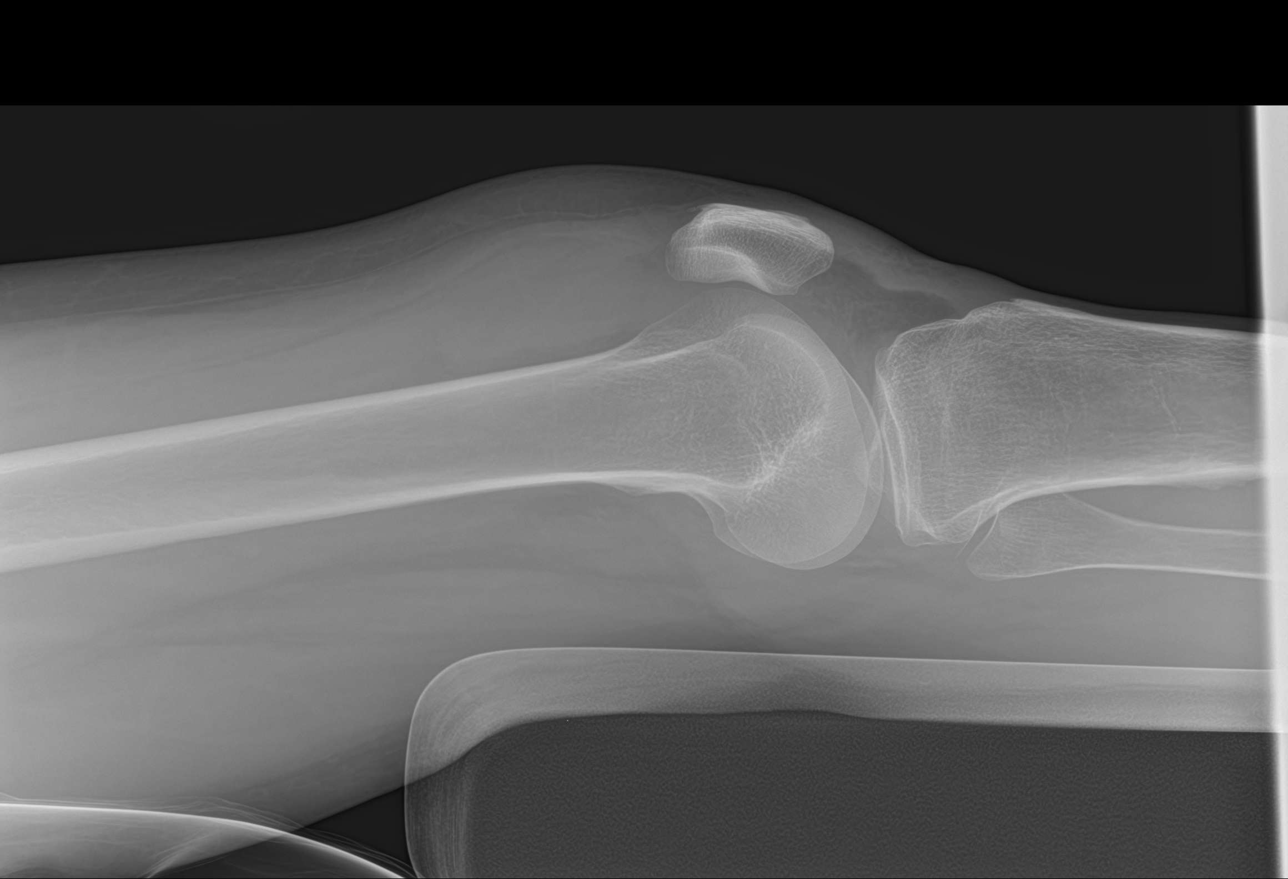

[knee obl (1 of 2)]
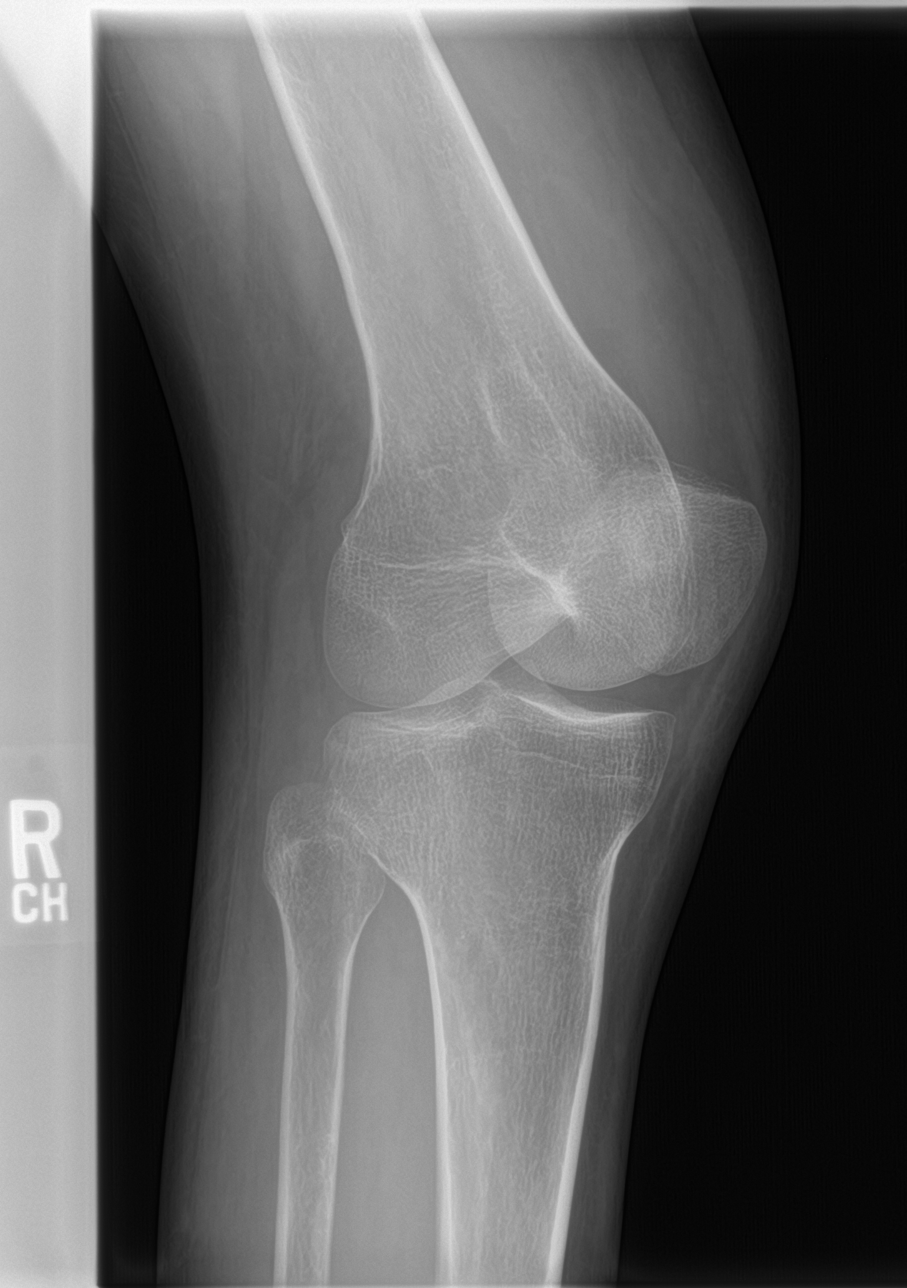

[knee obl (2 of 2)]
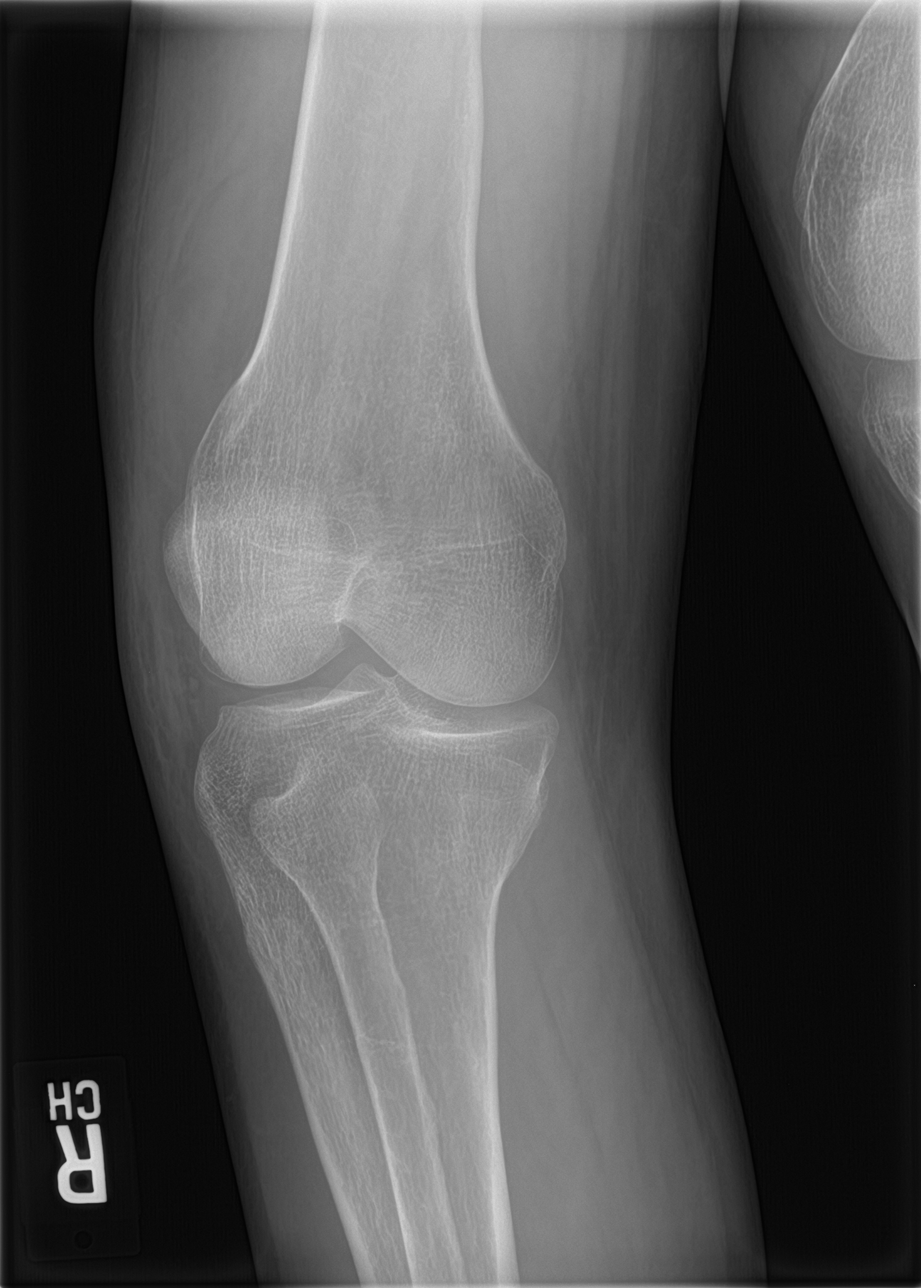

[4 of 4 positions shown; findings below may reference images not displayed]

FINDINGS: Moderate suprapatellar joint effusion. Subtle lucency within the
lateral tibial plateau identified on the first image.
IMPRESSION: Suprapatellar joint effusion.

Subtle lucency within the lateral tibial plateau, only identified on
the first image. Depending on clinical symptomatology, CT could be
performed to exclude nondisplaced fracture.
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
## 2016-02-10 MED ORDER — OXYCODONE-ACETAMINOPHEN 5-325 MG PO TABS: 1.0000 | ORAL_TABLET | ORAL | Status: DC | PRN

## 2016-02-10 MED ORDER — OXYCODONE-ACETAMINOPHEN 5-325 MG PO TABS
1.0000 | ORAL_TABLET | Freq: Once | ORAL | Status: AC
  Administered 2016-02-10: 1 via ORAL
  Filled 2016-02-10: qty 1
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
## 2016-02-10 NOTE — Discharge Instructions (Signed)
Wear the knee immobilizer whenever you get up. Elevate your leg above the heart, as much as possible. Follow-up with the orthopedic doctor in 2 or 3 days. Go to your dialysis treatment center tomorrow for dialysis.   Tibial Fracture, Adult A tibial fracture is a break in the larger bone of your lower leg (tibia). This bone is also called the shin bone. CAUSES   Low-energy injuries, such as a fall from ground level.   High-energy injuries, such as motor vehicle injuries or high-speed sports collisions. RISK FACTORS  Jumping activities.   Repetitive stress, such as long-distance running.   Participation in sports.   Osteoporosis.   Advanced age.  SIGNS AND SYMPTOMS  Pain.   Swelling.   Inability to put weight on your injured leg.   Bone deformities at the site of your injury.   Bruising.  DIAGNOSIS  A tibial fracture can usually be diagnosed using X-rays. TREATMENT  A tibial fracture will often be treated with simple immobilization. A cast or splint will be used on your leg to keep it from moving while it heals. If the injury caused parts of the bone to move out of place, your health care provider may reposition those parts before putting on your cast or splint. The cast or splint will remain in place until your health care provider thinks the bone has healed well enough. Then you can begin range-of-motion exercises to regain your knee motion. For severe injuries, surgery is sometimes needed to insert plates or screws into the injured area. HOME CARE INSTRUCTIONS   If you have a plaster or fiberglass cast:   Do not try to scratch the skin under the cast using sharp or pointed objects.   Check the skin around the cast every day. You may put lotion on any red or sore areas.   Keep your cast dry and clean.   If you have a plaster splint:   Wear the splint as directed.   Loosen the elastic around the splint if your toes become numb, tingle, or turn cold  or blue.   Do not put pressure on any part of your cast or splint until it is fully hardened.   Use a plastic bag to protect your cast or splint during bathing. Do not lower the cast or splint into water.   Use crutches as directed.   Take medicines only as directed by your health care provider.   Keep all follow-up visits as directed by your health care provider. This is important.  SEEK MEDICAL CARE IF:  Your pain is becoming worse rather than better or is not controlled with medicines.   You have increased swelling or redness in your foot.   You begin to lose feeling in your foot or toes.  SEEK IMMEDIATE MEDICAL CARE IF:   Your foot or toes on the injured side feel cold or turn blue.   You develop severe pain in your injured leg, especially if the pain is increased with movement of your toes.  MAKE SURE YOU:  Understand these instructions.   Will watch your condition.   Will get help right away if you are not doing well or get worse.    This information is not intended to replace advice given to you by your health care provider. Make sure you discuss any questions you have with your health care provider.   Document Released: 05/05/2001 Document Revised: 12/25/2014 Document Reviewed: 10/04/2013 Elsevier Interactive Patient Education [employer].

## 2016-02-10 NOTE — ED Notes (Signed)
Patient [place] beach and fell directly down onto right knee. Complains of right knee pain and swelling.

## 2016-02-10 NOTE — Progress Notes (Signed)
Orthopedic Tech Progress Note Patient Details:  Candace Griffin [birth year] [id number]  Ortho Devices Type of Ortho Device: Ace wrap, Knee Immobilizer, Crutches Ortho Device/Splint Location: RLE knee Ortho Device/Splint Interventions: Ordered, Application   Braulio Bosch 02/10/2016, 6:01 PM

## 2016-02-10 NOTE — ED Notes (Signed)
Pt is in stable condition upon d/c and is escorted from ED via wheelchair. 

## 2016-02-12 DIAGNOSIS — D509 Iron deficiency anemia, unspecified: Secondary | ICD-10-CM | POA: Diagnosis not present

## 2016-02-12 DIAGNOSIS — D631 Anemia in chronic kidney disease: Secondary | ICD-10-CM | POA: Diagnosis not present

## 2016-02-12 DIAGNOSIS — N2581 Secondary hyperparathyroidism of renal origin: Secondary | ICD-10-CM | POA: Diagnosis not present

## 2016-02-12 DIAGNOSIS — N186 End stage renal disease: Secondary | ICD-10-CM | POA: Diagnosis not present

## 2016-02-14 ENCOUNTER — Ambulatory Visit: Payer: Self-pay | Admitting: Family Medicine

## 2016-02-14 DIAGNOSIS — S82141A Displaced bicondylar fracture of right tibia, initial encounter for closed fracture: Secondary | ICD-10-CM | POA: Diagnosis not present

## 2016-02-14 DIAGNOSIS — D631 Anemia in chronic kidney disease: Secondary | ICD-10-CM | POA: Diagnosis not present

## 2016-02-14 DIAGNOSIS — N2581 Secondary hyperparathyroidism of renal origin: Secondary | ICD-10-CM | POA: Diagnosis not present

## 2016-02-14 DIAGNOSIS — N186 End stage renal disease: Secondary | ICD-10-CM | POA: Diagnosis not present

## 2016-02-14 DIAGNOSIS — D509 Iron deficiency anemia, unspecified: Secondary | ICD-10-CM | POA: Diagnosis not present

## 2016-02-17 DIAGNOSIS — D509 Iron deficiency anemia, unspecified: Secondary | ICD-10-CM | POA: Diagnosis not present

## 2016-02-17 DIAGNOSIS — N2581 Secondary hyperparathyroidism of renal origin: Secondary | ICD-10-CM | POA: Diagnosis not present

## 2016-02-17 DIAGNOSIS — D631 Anemia in chronic kidney disease: Secondary | ICD-10-CM | POA: Diagnosis not present

## 2016-02-17 DIAGNOSIS — N186 End stage renal disease: Secondary | ICD-10-CM | POA: Diagnosis not present

## 2016-02-19 DIAGNOSIS — D509 Iron deficiency anemia, unspecified: Secondary | ICD-10-CM | POA: Diagnosis not present

## 2016-02-19 DIAGNOSIS — N186 End stage renal disease: Secondary | ICD-10-CM | POA: Diagnosis not present

## 2016-02-19 DIAGNOSIS — N2581 Secondary hyperparathyroidism of renal origin: Secondary | ICD-10-CM | POA: Diagnosis not present

## 2016-02-19 DIAGNOSIS — D631 Anemia in chronic kidney disease: Secondary | ICD-10-CM | POA: Diagnosis not present

## 2016-02-21 DIAGNOSIS — N2581 Secondary hyperparathyroidism of renal origin: Secondary | ICD-10-CM | POA: Diagnosis not present

## 2016-02-21 DIAGNOSIS — I129 Hypertensive chronic kidney disease with stage 1 through stage 4 chronic kidney disease, or unspecified chronic kidney disease: Secondary | ICD-10-CM | POA: Diagnosis not present

## 2016-02-21 DIAGNOSIS — D509 Iron deficiency anemia, unspecified: Secondary | ICD-10-CM | POA: Diagnosis not present

## 2016-02-21 DIAGNOSIS — D631 Anemia in chronic kidney disease: Secondary | ICD-10-CM | POA: Diagnosis not present

## 2016-02-21 DIAGNOSIS — Z992 Dependence on renal dialysis: Secondary | ICD-10-CM | POA: Diagnosis not present

## 2016-02-21 DIAGNOSIS — N186 End stage renal disease: Secondary | ICD-10-CM | POA: Diagnosis not present

## 2016-02-24 DIAGNOSIS — N2581 Secondary hyperparathyroidism of renal origin: Secondary | ICD-10-CM | POA: Diagnosis not present

## 2016-02-24 DIAGNOSIS — D509 Iron deficiency anemia, unspecified: Secondary | ICD-10-CM | POA: Diagnosis not present

## 2016-02-24 DIAGNOSIS — D631 Anemia in chronic kidney disease: Secondary | ICD-10-CM | POA: Diagnosis not present

## 2016-02-24 DIAGNOSIS — N186 End stage renal disease: Secondary | ICD-10-CM | POA: Diagnosis not present

## 2016-02-26 DIAGNOSIS — D509 Iron deficiency anemia, unspecified: Secondary | ICD-10-CM | POA: Diagnosis not present

## 2016-02-26 DIAGNOSIS — N2581 Secondary hyperparathyroidism of renal origin: Secondary | ICD-10-CM | POA: Diagnosis not present

## 2016-02-26 DIAGNOSIS — N186 End stage renal disease: Secondary | ICD-10-CM | POA: Diagnosis not present

## 2016-02-26 DIAGNOSIS — D631 Anemia in chronic kidney disease: Secondary | ICD-10-CM | POA: Diagnosis not present

## 2016-02-28 DIAGNOSIS — D631 Anemia in chronic kidney disease: Secondary | ICD-10-CM | POA: Diagnosis not present

## 2016-02-28 DIAGNOSIS — N2581 Secondary hyperparathyroidism of renal origin: Secondary | ICD-10-CM | POA: Diagnosis not present

## 2016-02-28 DIAGNOSIS — S82141D Displaced bicondylar fracture of right tibia, subsequent encounter for closed fracture with routine healing: Secondary | ICD-10-CM | POA: Diagnosis not present

## 2016-02-28 DIAGNOSIS — D509 Iron deficiency anemia, unspecified: Secondary | ICD-10-CM | POA: Diagnosis not present

## 2016-02-28 DIAGNOSIS — N186 End stage renal disease: Secondary | ICD-10-CM | POA: Diagnosis not present

## 2016-03-02 DIAGNOSIS — N2581 Secondary hyperparathyroidism of renal origin: Secondary | ICD-10-CM | POA: Diagnosis not present

## 2016-03-02 DIAGNOSIS — D509 Iron deficiency anemia, unspecified: Secondary | ICD-10-CM | POA: Diagnosis not present

## 2016-03-02 DIAGNOSIS — N186 End stage renal disease: Secondary | ICD-10-CM | POA: Diagnosis not present

## 2016-03-02 DIAGNOSIS — D631 Anemia in chronic kidney disease: Secondary | ICD-10-CM | POA: Diagnosis not present

## 2016-03-04 DIAGNOSIS — D509 Iron deficiency anemia, unspecified: Secondary | ICD-10-CM | POA: Diagnosis not present

## 2016-03-04 DIAGNOSIS — N186 End stage renal disease: Secondary | ICD-10-CM | POA: Diagnosis not present

## 2016-03-04 DIAGNOSIS — N2581 Secondary hyperparathyroidism of renal origin: Secondary | ICD-10-CM | POA: Diagnosis not present

## 2016-03-04 DIAGNOSIS — D631 Anemia in chronic kidney disease: Secondary | ICD-10-CM | POA: Diagnosis not present

## 2016-03-06 DIAGNOSIS — D509 Iron deficiency anemia, unspecified: Secondary | ICD-10-CM | POA: Diagnosis not present

## 2016-03-06 DIAGNOSIS — N186 End stage renal disease: Secondary | ICD-10-CM | POA: Diagnosis not present

## 2016-03-06 DIAGNOSIS — D631 Anemia in chronic kidney disease: Secondary | ICD-10-CM | POA: Diagnosis not present

## 2016-03-06 DIAGNOSIS — N2581 Secondary hyperparathyroidism of renal origin: Secondary | ICD-10-CM | POA: Diagnosis not present

## 2016-03-09 DIAGNOSIS — D509 Iron deficiency anemia, unspecified: Secondary | ICD-10-CM | POA: Diagnosis not present

## 2016-03-09 DIAGNOSIS — N2581 Secondary hyperparathyroidism of renal origin: Secondary | ICD-10-CM | POA: Diagnosis not present

## 2016-03-09 DIAGNOSIS — N186 End stage renal disease: Secondary | ICD-10-CM | POA: Diagnosis not present

## 2016-03-09 DIAGNOSIS — D631 Anemia in chronic kidney disease: Secondary | ICD-10-CM | POA: Diagnosis not present

## 2016-03-11 DIAGNOSIS — N186 End stage renal disease: Secondary | ICD-10-CM | POA: Diagnosis not present

## 2016-03-11 DIAGNOSIS — N2581 Secondary hyperparathyroidism of renal origin: Secondary | ICD-10-CM | POA: Diagnosis not present

## 2016-03-11 DIAGNOSIS — D631 Anemia in chronic kidney disease: Secondary | ICD-10-CM | POA: Diagnosis not present

## 2016-03-11 DIAGNOSIS — D509 Iron deficiency anemia, unspecified: Secondary | ICD-10-CM | POA: Diagnosis not present

## 2016-03-12 DIAGNOSIS — D509 Iron deficiency anemia, unspecified: Secondary | ICD-10-CM | POA: Diagnosis not present

## 2016-03-12 DIAGNOSIS — N2581 Secondary hyperparathyroidism of renal origin: Secondary | ICD-10-CM | POA: Diagnosis not present

## 2016-03-12 DIAGNOSIS — D631 Anemia in chronic kidney disease: Secondary | ICD-10-CM | POA: Diagnosis not present

## 2016-03-12 DIAGNOSIS — N186 End stage renal disease: Secondary | ICD-10-CM | POA: Diagnosis not present

## 2016-03-16 DIAGNOSIS — D631 Anemia in chronic kidney disease: Secondary | ICD-10-CM | POA: Diagnosis not present

## 2016-03-16 DIAGNOSIS — N2581 Secondary hyperparathyroidism of renal origin: Secondary | ICD-10-CM | POA: Diagnosis not present

## 2016-03-16 DIAGNOSIS — N186 End stage renal disease: Secondary | ICD-10-CM | POA: Diagnosis not present

## 2016-03-16 DIAGNOSIS — D509 Iron deficiency anemia, unspecified: Secondary | ICD-10-CM | POA: Diagnosis not present

## 2016-03-18 DIAGNOSIS — D509 Iron deficiency anemia, unspecified: Secondary | ICD-10-CM | POA: Diagnosis not present

## 2016-03-18 DIAGNOSIS — N2581 Secondary hyperparathyroidism of renal origin: Secondary | ICD-10-CM | POA: Diagnosis not present

## 2016-03-18 DIAGNOSIS — D631 Anemia in chronic kidney disease: Secondary | ICD-10-CM | POA: Diagnosis not present

## 2016-03-18 DIAGNOSIS — N186 End stage renal disease: Secondary | ICD-10-CM | POA: Diagnosis not present

## 2016-03-23 DIAGNOSIS — I129 Hypertensive chronic kidney disease with stage 1 through stage 4 chronic kidney disease, or unspecified chronic kidney disease: Secondary | ICD-10-CM | POA: Diagnosis not present

## 2016-03-23 DIAGNOSIS — N186 End stage renal disease: Secondary | ICD-10-CM | POA: Diagnosis not present

## 2016-03-23 DIAGNOSIS — N2581 Secondary hyperparathyroidism of renal origin: Secondary | ICD-10-CM | POA: Diagnosis not present

## 2016-03-23 DIAGNOSIS — D631 Anemia in chronic kidney disease: Secondary | ICD-10-CM | POA: Diagnosis not present

## 2016-03-23 DIAGNOSIS — D509 Iron deficiency anemia, unspecified: Secondary | ICD-10-CM | POA: Diagnosis not present

## 2016-03-23 DIAGNOSIS — Z992 Dependence on renal dialysis: Secondary | ICD-10-CM | POA: Diagnosis not present

## 2016-03-25 DIAGNOSIS — D509 Iron deficiency anemia, unspecified: Secondary | ICD-10-CM | POA: Diagnosis not present

## 2016-03-25 DIAGNOSIS — D631 Anemia in chronic kidney disease: Secondary | ICD-10-CM | POA: Diagnosis not present

## 2016-03-25 DIAGNOSIS — N2581 Secondary hyperparathyroidism of renal origin: Secondary | ICD-10-CM | POA: Diagnosis not present

## 2016-03-25 DIAGNOSIS — N186 End stage renal disease: Secondary | ICD-10-CM | POA: Diagnosis not present

## 2016-03-27 DIAGNOSIS — N186 End stage renal disease: Secondary | ICD-10-CM | POA: Diagnosis not present

## 2016-03-27 DIAGNOSIS — N2581 Secondary hyperparathyroidism of renal origin: Secondary | ICD-10-CM | POA: Diagnosis not present

## 2016-03-27 DIAGNOSIS — D631 Anemia in chronic kidney disease: Secondary | ICD-10-CM | POA: Diagnosis not present

## 2016-03-27 DIAGNOSIS — D509 Iron deficiency anemia, unspecified: Secondary | ICD-10-CM | POA: Diagnosis not present

## 2016-03-30 DIAGNOSIS — N2581 Secondary hyperparathyroidism of renal origin: Secondary | ICD-10-CM | POA: Diagnosis not present

## 2016-03-30 DIAGNOSIS — D631 Anemia in chronic kidney disease: Secondary | ICD-10-CM | POA: Diagnosis not present

## 2016-03-30 DIAGNOSIS — N186 End stage renal disease: Secondary | ICD-10-CM | POA: Diagnosis not present

## 2016-03-30 DIAGNOSIS — D509 Iron deficiency anemia, unspecified: Secondary | ICD-10-CM | POA: Diagnosis not present

## 2016-04-01 DIAGNOSIS — D509 Iron deficiency anemia, unspecified: Secondary | ICD-10-CM | POA: Diagnosis not present

## 2016-04-01 DIAGNOSIS — N2581 Secondary hyperparathyroidism of renal origin: Secondary | ICD-10-CM | POA: Diagnosis not present

## 2016-04-01 DIAGNOSIS — D631 Anemia in chronic kidney disease: Secondary | ICD-10-CM | POA: Diagnosis not present

## 2016-04-01 DIAGNOSIS — N186 End stage renal disease: Secondary | ICD-10-CM | POA: Diagnosis not present

## 2016-04-02 NOTE — Progress Notes (Signed)
Subjective:     Patient ID: Candace Griffin, female   DOB: [birth year], 40 y.o.   MRN: [id number]  HPI Candace Griffin is a 40 year old female presenting for possible yeast infection. - Reports history of BV and yeast infections. Feels that this is more consistent with prior yeast infections - Symptoms present for 1 week - Notes vaginal itching - Increased yellow discharge with odor - No changes in urination. Reports she urinates infrequently given dialysis status (MWF). Unable to provide urine sample. Believes she might have some mild dysuria. - Reports she has not had intercourse for 2 years. Denies possible exposure to STDs. - Denies fever, abdominal pain, flank pain, nausea, vomiting  Review of Systems Per HPI. Other systems negative.    Objective:   Physical Exam  Constitutional: She appears well-developed and well-nourished. No distress.  Cardiovascular: Normal rate and regular rhythm.  Exam reveals no gallop and no friction rub.   No murmur heard. Pulmonary/Chest: Effort normal. No respiratory distress. She has no wheezes.  Abdominal: Soft. She exhibits no distension. There is no tenderness.  No CVA tenderness  Genitourinary:  Genitourinary Comments: White vaginal discharge noted, no cervical motion tenderness, no vaginal wall tenderness, no genital ulcers noted.  Skin: No rash noted.  Psychiatric: She has a normal mood and affect. Her behavior is normal.      Assessment and Plan:     1. Candidiasis of vagina - Wet Prep with yeast - Diflucan x1 - Unable to provide urine for urinalysis. Suspect dysuria is due to yeast infection. If no improvement with treatment, consider treatment of UTI. - Follow up if symptoms worsen or fail to resolve - fluconazole (DIFLUCAN) 150 MG tablet; Take 1 tablet (150 mg total) by mouth once.  Dispense: 1 tablet; Refill: 0

## 2016-04-03 ENCOUNTER — Ambulatory Visit (INDEPENDENT_AMBULATORY_CARE_PROVIDER_SITE_OTHER): Payer: Medicare Other | Admitting: Family Medicine

## 2016-04-03 VITALS — BP 129/101 | HR 90 | Temp 98.4°F | Wt 129.0 lb

## 2016-04-03 DIAGNOSIS — D631 Anemia in chronic kidney disease: Secondary | ICD-10-CM | POA: Diagnosis not present

## 2016-04-03 DIAGNOSIS — N186 End stage renal disease: Secondary | ICD-10-CM | POA: Diagnosis not present

## 2016-04-03 DIAGNOSIS — D509 Iron deficiency anemia, unspecified: Secondary | ICD-10-CM | POA: Diagnosis not present

## 2016-04-03 DIAGNOSIS — B373 Candidiasis of vulva and vagina: Secondary | ICD-10-CM | POA: Diagnosis present

## 2016-04-03 DIAGNOSIS — B3731 Acute candidiasis of vulva and vagina: Secondary | ICD-10-CM

## 2016-04-03 DIAGNOSIS — N2581 Secondary hyperparathyroidism of renal origin: Secondary | ICD-10-CM | POA: Diagnosis not present

## 2016-04-03 LAB — POCT WET PREP (WET MOUNT)
Clue Cells Wet Prep Whiff POC: NEGATIVE
Trichomonas Wet Prep HPF POC: ABSENT

## 2016-04-03 MED ORDER — FLUCONAZOLE 150 MG PO TABS: 150.0000 mg | ORAL_TABLET | Freq: Once | ORAL | 0 refills | Status: AC

## 2016-04-03 NOTE — Patient Instructions (Signed)
Thank you so much for coming to visit today! Your wet prep showed a yeast infection. Diflucan was sent to the pharmacy. If your symptoms do not improve over the next week, please let us know.  Dr. Gerlean Ren

## 2016-04-06 DIAGNOSIS — D631 Anemia in chronic kidney disease: Secondary | ICD-10-CM | POA: Diagnosis not present

## 2016-04-06 DIAGNOSIS — N2581 Secondary hyperparathyroidism of renal origin: Secondary | ICD-10-CM | POA: Diagnosis not present

## 2016-04-06 DIAGNOSIS — D509 Iron deficiency anemia, unspecified: Secondary | ICD-10-CM | POA: Diagnosis not present

## 2016-04-06 DIAGNOSIS — N186 End stage renal disease: Secondary | ICD-10-CM | POA: Diagnosis not present

## 2016-04-08 DIAGNOSIS — N2581 Secondary hyperparathyroidism of renal origin: Secondary | ICD-10-CM | POA: Diagnosis not present

## 2016-04-08 DIAGNOSIS — D631 Anemia in chronic kidney disease: Secondary | ICD-10-CM | POA: Diagnosis not present

## 2016-04-08 DIAGNOSIS — D509 Iron deficiency anemia, unspecified: Secondary | ICD-10-CM | POA: Diagnosis not present

## 2016-04-08 DIAGNOSIS — N186 End stage renal disease: Secondary | ICD-10-CM | POA: Diagnosis not present

## 2016-04-09 ENCOUNTER — Telehealth: Payer: Self-pay | Admitting: Family Medicine

## 2016-04-09 NOTE — Telephone Encounter (Signed)
Pt was seen last week in our clinic by Dr. Gerlean Ren. She was told that if her symptoms have not improved to call and let her know. Well they are not improving and she would like to speak to Dr. Gerlean Ren. jw

## 2016-04-10 DIAGNOSIS — N186 End stage renal disease: Secondary | ICD-10-CM | POA: Diagnosis not present

## 2016-04-10 DIAGNOSIS — D631 Anemia in chronic kidney disease: Secondary | ICD-10-CM | POA: Diagnosis not present

## 2016-04-10 DIAGNOSIS — N2581 Secondary hyperparathyroidism of renal origin: Secondary | ICD-10-CM | POA: Diagnosis not present

## 2016-04-10 DIAGNOSIS — D509 Iron deficiency anemia, unspecified: Secondary | ICD-10-CM | POA: Diagnosis not present

## 2016-04-10 MED ORDER — FLUCONAZOLE 150 MG PO TABS: 150.0000 mg | ORAL_TABLET | Freq: Once | ORAL | 0 refills | Status: AC

## 2016-04-10 MED ORDER — CEPHALEXIN 250 MG PO CAPS: 250.0000 mg | ORAL_CAPSULE | Freq: Every day | ORAL | 0 refills | Status: DC

## 2016-04-10 NOTE — Telephone Encounter (Signed)
Attempted to contact. Will treat for UTI given no improvement. Discussed dosing of Keflex with pharmacy. Will prescribed Keflex 250mg  once daily for 7 days, to take after dialysis on HD days. Also sent one more dose of diflucan as some women requires two doses.

## 2016-04-10 NOTE — Telephone Encounter (Signed)
Pt informed of below. Zimmerman Rumple, April D, CMA  

## 2016-04-13 DIAGNOSIS — D631 Anemia in chronic kidney disease: Secondary | ICD-10-CM | POA: Diagnosis not present

## 2016-04-13 DIAGNOSIS — D509 Iron deficiency anemia, unspecified: Secondary | ICD-10-CM | POA: Diagnosis not present

## 2016-04-13 DIAGNOSIS — N186 End stage renal disease: Secondary | ICD-10-CM | POA: Diagnosis not present

## 2016-04-13 DIAGNOSIS — N2581 Secondary hyperparathyroidism of renal origin: Secondary | ICD-10-CM | POA: Diagnosis not present

## 2016-04-15 DIAGNOSIS — N2581 Secondary hyperparathyroidism of renal origin: Secondary | ICD-10-CM | POA: Diagnosis not present

## 2016-04-15 DIAGNOSIS — D509 Iron deficiency anemia, unspecified: Secondary | ICD-10-CM | POA: Diagnosis not present

## 2016-04-15 DIAGNOSIS — N186 End stage renal disease: Secondary | ICD-10-CM | POA: Diagnosis not present

## 2016-04-15 DIAGNOSIS — D631 Anemia in chronic kidney disease: Secondary | ICD-10-CM | POA: Diagnosis not present

## 2016-04-16 DIAGNOSIS — N186 End stage renal disease: Secondary | ICD-10-CM | POA: Diagnosis not present

## 2016-04-16 DIAGNOSIS — Z114 Encounter for screening for human immunodeficiency virus [HIV]: Secondary | ICD-10-CM | POA: Diagnosis not present

## 2016-04-16 DIAGNOSIS — N039 Chronic nephritic syndrome with unspecified morphologic changes: Secondary | ICD-10-CM | POA: Diagnosis not present

## 2016-04-16 DIAGNOSIS — Z01810 Encounter for preprocedural cardiovascular examination: Secondary | ICD-10-CM | POA: Diagnosis not present

## 2016-04-16 DIAGNOSIS — Z992 Dependence on renal dialysis: Secondary | ICD-10-CM | POA: Diagnosis not present

## 2016-04-16 DIAGNOSIS — Z01818 Encounter for other preprocedural examination: Secondary | ICD-10-CM | POA: Diagnosis not present

## 2016-04-16 DIAGNOSIS — Z940 Kidney transplant status: Secondary | ICD-10-CM | POA: Diagnosis not present

## 2016-04-16 DIAGNOSIS — Z1159 Encounter for screening for other viral diseases: Secondary | ICD-10-CM | POA: Diagnosis not present

## 2016-04-16 DIAGNOSIS — Z7682 Awaiting organ transplant status: Secondary | ICD-10-CM | POA: Diagnosis not present

## 2016-04-17 DIAGNOSIS — D631 Anemia in chronic kidney disease: Secondary | ICD-10-CM | POA: Diagnosis not present

## 2016-04-17 DIAGNOSIS — N186 End stage renal disease: Secondary | ICD-10-CM | POA: Diagnosis not present

## 2016-04-17 DIAGNOSIS — N2581 Secondary hyperparathyroidism of renal origin: Secondary | ICD-10-CM | POA: Diagnosis not present

## 2016-04-17 DIAGNOSIS — D509 Iron deficiency anemia, unspecified: Secondary | ICD-10-CM | POA: Diagnosis not present

## 2016-04-20 DIAGNOSIS — N186 End stage renal disease: Secondary | ICD-10-CM | POA: Diagnosis not present

## 2016-04-20 DIAGNOSIS — D631 Anemia in chronic kidney disease: Secondary | ICD-10-CM | POA: Diagnosis not present

## 2016-04-20 DIAGNOSIS — D509 Iron deficiency anemia, unspecified: Secondary | ICD-10-CM | POA: Diagnosis not present

## 2016-04-20 DIAGNOSIS — N2581 Secondary hyperparathyroidism of renal origin: Secondary | ICD-10-CM | POA: Diagnosis not present

## 2016-04-22 DIAGNOSIS — N2581 Secondary hyperparathyroidism of renal origin: Secondary | ICD-10-CM | POA: Diagnosis not present

## 2016-04-22 DIAGNOSIS — N186 End stage renal disease: Secondary | ICD-10-CM | POA: Diagnosis not present

## 2016-04-22 DIAGNOSIS — D509 Iron deficiency anemia, unspecified: Secondary | ICD-10-CM | POA: Diagnosis not present

## 2016-04-22 DIAGNOSIS — D631 Anemia in chronic kidney disease: Secondary | ICD-10-CM | POA: Diagnosis not present

## 2016-04-23 DIAGNOSIS — I129 Hypertensive chronic kidney disease with stage 1 through stage 4 chronic kidney disease, or unspecified chronic kidney disease: Secondary | ICD-10-CM | POA: Diagnosis not present

## 2016-04-23 DIAGNOSIS — N186 End stage renal disease: Secondary | ICD-10-CM | POA: Diagnosis not present

## 2016-04-23 DIAGNOSIS — Z992 Dependence on renal dialysis: Secondary | ICD-10-CM | POA: Diagnosis not present

## 2016-04-24 DIAGNOSIS — D509 Iron deficiency anemia, unspecified: Secondary | ICD-10-CM | POA: Diagnosis not present

## 2016-04-24 DIAGNOSIS — D631 Anemia in chronic kidney disease: Secondary | ICD-10-CM | POA: Diagnosis not present

## 2016-04-24 DIAGNOSIS — N186 End stage renal disease: Secondary | ICD-10-CM | POA: Diagnosis not present

## 2016-04-24 DIAGNOSIS — Z23 Encounter for immunization: Secondary | ICD-10-CM | POA: Diagnosis not present

## 2016-04-24 DIAGNOSIS — N2581 Secondary hyperparathyroidism of renal origin: Secondary | ICD-10-CM | POA: Diagnosis not present

## 2016-04-27 DIAGNOSIS — Z23 Encounter for immunization: Secondary | ICD-10-CM | POA: Diagnosis not present

## 2016-04-27 DIAGNOSIS — N186 End stage renal disease: Secondary | ICD-10-CM | POA: Diagnosis not present

## 2016-04-27 DIAGNOSIS — N2581 Secondary hyperparathyroidism of renal origin: Secondary | ICD-10-CM | POA: Diagnosis not present

## 2016-04-27 DIAGNOSIS — D631 Anemia in chronic kidney disease: Secondary | ICD-10-CM | POA: Diagnosis not present

## 2016-04-27 DIAGNOSIS — D509 Iron deficiency anemia, unspecified: Secondary | ICD-10-CM | POA: Diagnosis not present

## 2016-04-29 DIAGNOSIS — Z23 Encounter for immunization: Secondary | ICD-10-CM | POA: Diagnosis not present

## 2016-04-29 DIAGNOSIS — N2581 Secondary hyperparathyroidism of renal origin: Secondary | ICD-10-CM | POA: Diagnosis not present

## 2016-04-29 DIAGNOSIS — D509 Iron deficiency anemia, unspecified: Secondary | ICD-10-CM | POA: Diagnosis not present

## 2016-04-29 DIAGNOSIS — D631 Anemia in chronic kidney disease: Secondary | ICD-10-CM | POA: Diagnosis not present

## 2016-04-29 DIAGNOSIS — N186 End stage renal disease: Secondary | ICD-10-CM | POA: Diagnosis not present

## 2016-05-01 DIAGNOSIS — D509 Iron deficiency anemia, unspecified: Secondary | ICD-10-CM | POA: Diagnosis not present

## 2016-05-01 DIAGNOSIS — N2581 Secondary hyperparathyroidism of renal origin: Secondary | ICD-10-CM | POA: Diagnosis not present

## 2016-05-01 DIAGNOSIS — D631 Anemia in chronic kidney disease: Secondary | ICD-10-CM | POA: Diagnosis not present

## 2016-05-01 DIAGNOSIS — N186 End stage renal disease: Secondary | ICD-10-CM | POA: Diagnosis not present

## 2016-05-01 DIAGNOSIS — Z23 Encounter for immunization: Secondary | ICD-10-CM | POA: Diagnosis not present

## 2016-05-04 DIAGNOSIS — D509 Iron deficiency anemia, unspecified: Secondary | ICD-10-CM | POA: Diagnosis not present

## 2016-05-04 DIAGNOSIS — N186 End stage renal disease: Secondary | ICD-10-CM | POA: Diagnosis not present

## 2016-05-04 DIAGNOSIS — D631 Anemia in chronic kidney disease: Secondary | ICD-10-CM | POA: Diagnosis not present

## 2016-05-04 DIAGNOSIS — Z23 Encounter for immunization: Secondary | ICD-10-CM | POA: Diagnosis not present

## 2016-05-04 DIAGNOSIS — N2581 Secondary hyperparathyroidism of renal origin: Secondary | ICD-10-CM | POA: Diagnosis not present

## 2016-05-06 DIAGNOSIS — D509 Iron deficiency anemia, unspecified: Secondary | ICD-10-CM | POA: Diagnosis not present

## 2016-05-06 DIAGNOSIS — N2581 Secondary hyperparathyroidism of renal origin: Secondary | ICD-10-CM | POA: Diagnosis not present

## 2016-05-06 DIAGNOSIS — D631 Anemia in chronic kidney disease: Secondary | ICD-10-CM | POA: Diagnosis not present

## 2016-05-06 DIAGNOSIS — Z23 Encounter for immunization: Secondary | ICD-10-CM | POA: Diagnosis not present

## 2016-05-06 DIAGNOSIS — N186 End stage renal disease: Secondary | ICD-10-CM | POA: Diagnosis not present

## 2016-05-08 DIAGNOSIS — D509 Iron deficiency anemia, unspecified: Secondary | ICD-10-CM | POA: Diagnosis not present

## 2016-05-08 DIAGNOSIS — N2581 Secondary hyperparathyroidism of renal origin: Secondary | ICD-10-CM | POA: Diagnosis not present

## 2016-05-08 DIAGNOSIS — D631 Anemia in chronic kidney disease: Secondary | ICD-10-CM | POA: Diagnosis not present

## 2016-05-08 DIAGNOSIS — Z23 Encounter for immunization: Secondary | ICD-10-CM | POA: Diagnosis not present

## 2016-05-08 DIAGNOSIS — N186 End stage renal disease: Secondary | ICD-10-CM | POA: Diagnosis not present

## 2016-05-11 DIAGNOSIS — D509 Iron deficiency anemia, unspecified: Secondary | ICD-10-CM | POA: Diagnosis not present

## 2016-05-11 DIAGNOSIS — N2581 Secondary hyperparathyroidism of renal origin: Secondary | ICD-10-CM | POA: Diagnosis not present

## 2016-05-11 DIAGNOSIS — N186 End stage renal disease: Secondary | ICD-10-CM | POA: Diagnosis not present

## 2016-05-11 DIAGNOSIS — Z23 Encounter for immunization: Secondary | ICD-10-CM | POA: Diagnosis not present

## 2016-05-11 DIAGNOSIS — D631 Anemia in chronic kidney disease: Secondary | ICD-10-CM | POA: Diagnosis not present

## 2016-05-13 DIAGNOSIS — D631 Anemia in chronic kidney disease: Secondary | ICD-10-CM | POA: Diagnosis not present

## 2016-05-13 DIAGNOSIS — Z23 Encounter for immunization: Secondary | ICD-10-CM | POA: Diagnosis not present

## 2016-05-13 DIAGNOSIS — N186 End stage renal disease: Secondary | ICD-10-CM | POA: Diagnosis not present

## 2016-05-13 DIAGNOSIS — N2581 Secondary hyperparathyroidism of renal origin: Secondary | ICD-10-CM | POA: Diagnosis not present

## 2016-05-13 DIAGNOSIS — D509 Iron deficiency anemia, unspecified: Secondary | ICD-10-CM | POA: Diagnosis not present

## 2016-05-15 DIAGNOSIS — N2581 Secondary hyperparathyroidism of renal origin: Secondary | ICD-10-CM | POA: Diagnosis not present

## 2016-05-15 DIAGNOSIS — Z23 Encounter for immunization: Secondary | ICD-10-CM | POA: Diagnosis not present

## 2016-05-15 DIAGNOSIS — D631 Anemia in chronic kidney disease: Secondary | ICD-10-CM | POA: Diagnosis not present

## 2016-05-15 DIAGNOSIS — N186 End stage renal disease: Secondary | ICD-10-CM | POA: Diagnosis not present

## 2016-05-15 DIAGNOSIS — D509 Iron deficiency anemia, unspecified: Secondary | ICD-10-CM | POA: Diagnosis not present

## 2016-05-18 DIAGNOSIS — D631 Anemia in chronic kidney disease: Secondary | ICD-10-CM | POA: Diagnosis not present

## 2016-05-18 DIAGNOSIS — Z23 Encounter for immunization: Secondary | ICD-10-CM | POA: Diagnosis not present

## 2016-05-18 DIAGNOSIS — N2581 Secondary hyperparathyroidism of renal origin: Secondary | ICD-10-CM | POA: Diagnosis not present

## 2016-05-18 DIAGNOSIS — N186 End stage renal disease: Secondary | ICD-10-CM | POA: Diagnosis not present

## 2016-05-18 DIAGNOSIS — D509 Iron deficiency anemia, unspecified: Secondary | ICD-10-CM | POA: Diagnosis not present

## 2016-05-20 DIAGNOSIS — Z23 Encounter for immunization: Secondary | ICD-10-CM | POA: Diagnosis not present

## 2016-05-20 DIAGNOSIS — D631 Anemia in chronic kidney disease: Secondary | ICD-10-CM | POA: Diagnosis not present

## 2016-05-20 DIAGNOSIS — N186 End stage renal disease: Secondary | ICD-10-CM | POA: Diagnosis not present

## 2016-05-20 DIAGNOSIS — N2581 Secondary hyperparathyroidism of renal origin: Secondary | ICD-10-CM | POA: Diagnosis not present

## 2016-05-20 DIAGNOSIS — D509 Iron deficiency anemia, unspecified: Secondary | ICD-10-CM | POA: Diagnosis not present

## 2016-05-23 DIAGNOSIS — N186 End stage renal disease: Secondary | ICD-10-CM | POA: Diagnosis not present

## 2016-05-23 DIAGNOSIS — Z992 Dependence on renal dialysis: Secondary | ICD-10-CM | POA: Diagnosis not present

## 2016-05-23 DIAGNOSIS — D509 Iron deficiency anemia, unspecified: Secondary | ICD-10-CM | POA: Diagnosis not present

## 2016-05-23 DIAGNOSIS — N2581 Secondary hyperparathyroidism of renal origin: Secondary | ICD-10-CM | POA: Diagnosis not present

## 2016-05-23 DIAGNOSIS — I129 Hypertensive chronic kidney disease with stage 1 through stage 4 chronic kidney disease, or unspecified chronic kidney disease: Secondary | ICD-10-CM | POA: Diagnosis not present

## 2016-05-23 DIAGNOSIS — Z23 Encounter for immunization: Secondary | ICD-10-CM | POA: Diagnosis not present

## 2016-05-23 DIAGNOSIS — D631 Anemia in chronic kidney disease: Secondary | ICD-10-CM | POA: Diagnosis not present

## 2016-05-25 DIAGNOSIS — N186 End stage renal disease: Secondary | ICD-10-CM | POA: Diagnosis not present

## 2016-05-25 DIAGNOSIS — D509 Iron deficiency anemia, unspecified: Secondary | ICD-10-CM | POA: Diagnosis not present

## 2016-05-25 DIAGNOSIS — N2581 Secondary hyperparathyroidism of renal origin: Secondary | ICD-10-CM | POA: Diagnosis not present

## 2016-05-25 DIAGNOSIS — D631 Anemia in chronic kidney disease: Secondary | ICD-10-CM | POA: Diagnosis not present

## 2016-05-27 DIAGNOSIS — N186 End stage renal disease: Secondary | ICD-10-CM | POA: Diagnosis not present

## 2016-05-27 DIAGNOSIS — D631 Anemia in chronic kidney disease: Secondary | ICD-10-CM | POA: Diagnosis not present

## 2016-05-27 DIAGNOSIS — D509 Iron deficiency anemia, unspecified: Secondary | ICD-10-CM | POA: Diagnosis not present

## 2016-05-27 DIAGNOSIS — N2581 Secondary hyperparathyroidism of renal origin: Secondary | ICD-10-CM | POA: Diagnosis not present

## 2016-05-29 DIAGNOSIS — D509 Iron deficiency anemia, unspecified: Secondary | ICD-10-CM | POA: Diagnosis not present

## 2016-05-29 DIAGNOSIS — D631 Anemia in chronic kidney disease: Secondary | ICD-10-CM | POA: Diagnosis not present

## 2016-05-29 DIAGNOSIS — N2581 Secondary hyperparathyroidism of renal origin: Secondary | ICD-10-CM | POA: Diagnosis not present

## 2016-05-29 DIAGNOSIS — N186 End stage renal disease: Secondary | ICD-10-CM | POA: Diagnosis not present

## 2016-06-02 DIAGNOSIS — N186 End stage renal disease: Secondary | ICD-10-CM | POA: Diagnosis not present

## 2016-06-02 DIAGNOSIS — D509 Iron deficiency anemia, unspecified: Secondary | ICD-10-CM | POA: Diagnosis not present

## 2016-06-02 DIAGNOSIS — N2581 Secondary hyperparathyroidism of renal origin: Secondary | ICD-10-CM | POA: Diagnosis not present

## 2016-06-02 DIAGNOSIS — D631 Anemia in chronic kidney disease: Secondary | ICD-10-CM | POA: Diagnosis not present

## 2016-06-03 DIAGNOSIS — N2581 Secondary hyperparathyroidism of renal origin: Secondary | ICD-10-CM | POA: Diagnosis not present

## 2016-06-03 DIAGNOSIS — N186 End stage renal disease: Secondary | ICD-10-CM | POA: Diagnosis not present

## 2016-06-03 DIAGNOSIS — D509 Iron deficiency anemia, unspecified: Secondary | ICD-10-CM | POA: Diagnosis not present

## 2016-06-03 DIAGNOSIS — D631 Anemia in chronic kidney disease: Secondary | ICD-10-CM | POA: Diagnosis not present

## 2016-06-08 DIAGNOSIS — D631 Anemia in chronic kidney disease: Secondary | ICD-10-CM | POA: Diagnosis not present

## 2016-06-08 DIAGNOSIS — N186 End stage renal disease: Secondary | ICD-10-CM | POA: Diagnosis not present

## 2016-06-08 DIAGNOSIS — N2581 Secondary hyperparathyroidism of renal origin: Secondary | ICD-10-CM | POA: Diagnosis not present

## 2016-06-08 DIAGNOSIS — D509 Iron deficiency anemia, unspecified: Secondary | ICD-10-CM | POA: Diagnosis not present

## 2016-06-10 DIAGNOSIS — D631 Anemia in chronic kidney disease: Secondary | ICD-10-CM | POA: Diagnosis not present

## 2016-06-10 DIAGNOSIS — D509 Iron deficiency anemia, unspecified: Secondary | ICD-10-CM | POA: Diagnosis not present

## 2016-06-10 DIAGNOSIS — N2581 Secondary hyperparathyroidism of renal origin: Secondary | ICD-10-CM | POA: Diagnosis not present

## 2016-06-10 DIAGNOSIS — N186 End stage renal disease: Secondary | ICD-10-CM | POA: Diagnosis not present

## 2016-06-12 DIAGNOSIS — D631 Anemia in chronic kidney disease: Secondary | ICD-10-CM | POA: Diagnosis not present

## 2016-06-12 DIAGNOSIS — N186 End stage renal disease: Secondary | ICD-10-CM | POA: Diagnosis not present

## 2016-06-12 DIAGNOSIS — D509 Iron deficiency anemia, unspecified: Secondary | ICD-10-CM | POA: Diagnosis not present

## 2016-06-12 DIAGNOSIS — N2581 Secondary hyperparathyroidism of renal origin: Secondary | ICD-10-CM | POA: Diagnosis not present

## 2016-06-15 DIAGNOSIS — N2581 Secondary hyperparathyroidism of renal origin: Secondary | ICD-10-CM | POA: Diagnosis not present

## 2016-06-15 DIAGNOSIS — D631 Anemia in chronic kidney disease: Secondary | ICD-10-CM | POA: Diagnosis not present

## 2016-06-15 DIAGNOSIS — D509 Iron deficiency anemia, unspecified: Secondary | ICD-10-CM | POA: Diagnosis not present

## 2016-06-15 DIAGNOSIS — N186 End stage renal disease: Secondary | ICD-10-CM | POA: Diagnosis not present

## 2016-06-17 DIAGNOSIS — N186 End stage renal disease: Secondary | ICD-10-CM | POA: Diagnosis not present

## 2016-06-17 DIAGNOSIS — N2581 Secondary hyperparathyroidism of renal origin: Secondary | ICD-10-CM | POA: Diagnosis not present

## 2016-06-17 DIAGNOSIS — D631 Anemia in chronic kidney disease: Secondary | ICD-10-CM | POA: Diagnosis not present

## 2016-06-17 DIAGNOSIS — D509 Iron deficiency anemia, unspecified: Secondary | ICD-10-CM | POA: Diagnosis not present

## 2016-06-19 DIAGNOSIS — D631 Anemia in chronic kidney disease: Secondary | ICD-10-CM | POA: Diagnosis not present

## 2016-06-19 DIAGNOSIS — N2581 Secondary hyperparathyroidism of renal origin: Secondary | ICD-10-CM | POA: Diagnosis not present

## 2016-06-19 DIAGNOSIS — N186 End stage renal disease: Secondary | ICD-10-CM | POA: Diagnosis not present

## 2016-06-19 DIAGNOSIS — D509 Iron deficiency anemia, unspecified: Secondary | ICD-10-CM | POA: Diagnosis not present

## 2016-06-22 DIAGNOSIS — D509 Iron deficiency anemia, unspecified: Secondary | ICD-10-CM | POA: Diagnosis not present

## 2016-06-22 DIAGNOSIS — N2581 Secondary hyperparathyroidism of renal origin: Secondary | ICD-10-CM | POA: Diagnosis not present

## 2016-06-22 DIAGNOSIS — D631 Anemia in chronic kidney disease: Secondary | ICD-10-CM | POA: Diagnosis not present

## 2016-06-22 DIAGNOSIS — N186 End stage renal disease: Secondary | ICD-10-CM | POA: Diagnosis not present

## 2016-06-23 DIAGNOSIS — I129 Hypertensive chronic kidney disease with stage 1 through stage 4 chronic kidney disease, or unspecified chronic kidney disease: Secondary | ICD-10-CM | POA: Diagnosis not present

## 2016-06-23 DIAGNOSIS — Z992 Dependence on renal dialysis: Secondary | ICD-10-CM | POA: Diagnosis not present

## 2016-06-23 DIAGNOSIS — N186 End stage renal disease: Secondary | ICD-10-CM | POA: Diagnosis not present

## 2016-06-24 DIAGNOSIS — D509 Iron deficiency anemia, unspecified: Secondary | ICD-10-CM | POA: Diagnosis not present

## 2016-06-24 DIAGNOSIS — N2581 Secondary hyperparathyroidism of renal origin: Secondary | ICD-10-CM | POA: Diagnosis not present

## 2016-06-24 DIAGNOSIS — D631 Anemia in chronic kidney disease: Secondary | ICD-10-CM | POA: Diagnosis not present

## 2016-06-24 DIAGNOSIS — N186 End stage renal disease: Secondary | ICD-10-CM | POA: Diagnosis not present

## 2016-06-24 DIAGNOSIS — Z23 Encounter for immunization: Secondary | ICD-10-CM | POA: Diagnosis not present

## 2016-06-26 DIAGNOSIS — D631 Anemia in chronic kidney disease: Secondary | ICD-10-CM | POA: Diagnosis not present

## 2016-06-26 DIAGNOSIS — D509 Iron deficiency anemia, unspecified: Secondary | ICD-10-CM | POA: Diagnosis not present

## 2016-06-26 DIAGNOSIS — N2581 Secondary hyperparathyroidism of renal origin: Secondary | ICD-10-CM | POA: Diagnosis not present

## 2016-06-26 DIAGNOSIS — Z23 Encounter for immunization: Secondary | ICD-10-CM | POA: Diagnosis not present

## 2016-06-26 DIAGNOSIS — N186 End stage renal disease: Secondary | ICD-10-CM | POA: Diagnosis not present

## 2016-07-01 DIAGNOSIS — D631 Anemia in chronic kidney disease: Secondary | ICD-10-CM | POA: Diagnosis not present

## 2016-07-01 DIAGNOSIS — D509 Iron deficiency anemia, unspecified: Secondary | ICD-10-CM | POA: Diagnosis not present

## 2016-07-01 DIAGNOSIS — N186 End stage renal disease: Secondary | ICD-10-CM | POA: Diagnosis not present

## 2016-07-01 DIAGNOSIS — Z23 Encounter for immunization: Secondary | ICD-10-CM | POA: Diagnosis not present

## 2016-07-01 DIAGNOSIS — N2581 Secondary hyperparathyroidism of renal origin: Secondary | ICD-10-CM | POA: Diagnosis not present

## 2016-07-03 DIAGNOSIS — D631 Anemia in chronic kidney disease: Secondary | ICD-10-CM | POA: Diagnosis not present

## 2016-07-03 DIAGNOSIS — N2581 Secondary hyperparathyroidism of renal origin: Secondary | ICD-10-CM | POA: Diagnosis not present

## 2016-07-03 DIAGNOSIS — D509 Iron deficiency anemia, unspecified: Secondary | ICD-10-CM | POA: Diagnosis not present

## 2016-07-03 DIAGNOSIS — Z23 Encounter for immunization: Secondary | ICD-10-CM | POA: Diagnosis not present

## 2016-07-03 DIAGNOSIS — N186 End stage renal disease: Secondary | ICD-10-CM | POA: Diagnosis not present

## 2016-07-08 DIAGNOSIS — D631 Anemia in chronic kidney disease: Secondary | ICD-10-CM | POA: Diagnosis not present

## 2016-07-08 DIAGNOSIS — N2581 Secondary hyperparathyroidism of renal origin: Secondary | ICD-10-CM | POA: Diagnosis not present

## 2016-07-08 DIAGNOSIS — D509 Iron deficiency anemia, unspecified: Secondary | ICD-10-CM | POA: Diagnosis not present

## 2016-07-08 DIAGNOSIS — Z23 Encounter for immunization: Secondary | ICD-10-CM | POA: Diagnosis not present

## 2016-07-08 DIAGNOSIS — N186 End stage renal disease: Secondary | ICD-10-CM | POA: Diagnosis not present

## 2016-07-10 DIAGNOSIS — D509 Iron deficiency anemia, unspecified: Secondary | ICD-10-CM | POA: Diagnosis not present

## 2016-07-10 DIAGNOSIS — N186 End stage renal disease: Secondary | ICD-10-CM | POA: Diagnosis not present

## 2016-07-10 DIAGNOSIS — N2581 Secondary hyperparathyroidism of renal origin: Secondary | ICD-10-CM | POA: Diagnosis not present

## 2016-07-10 DIAGNOSIS — D631 Anemia in chronic kidney disease: Secondary | ICD-10-CM | POA: Diagnosis not present

## 2016-07-10 DIAGNOSIS — Z23 Encounter for immunization: Secondary | ICD-10-CM | POA: Diagnosis not present

## 2016-07-12 DIAGNOSIS — N186 End stage renal disease: Secondary | ICD-10-CM | POA: Diagnosis not present

## 2016-07-12 DIAGNOSIS — N2581 Secondary hyperparathyroidism of renal origin: Secondary | ICD-10-CM | POA: Diagnosis not present

## 2016-07-12 DIAGNOSIS — Z23 Encounter for immunization: Secondary | ICD-10-CM | POA: Diagnosis not present

## 2016-07-12 DIAGNOSIS — D631 Anemia in chronic kidney disease: Secondary | ICD-10-CM | POA: Diagnosis not present

## 2016-07-12 DIAGNOSIS — D509 Iron deficiency anemia, unspecified: Secondary | ICD-10-CM | POA: Diagnosis not present

## 2016-07-14 DIAGNOSIS — D631 Anemia in chronic kidney disease: Secondary | ICD-10-CM | POA: Diagnosis not present

## 2016-07-14 DIAGNOSIS — N186 End stage renal disease: Secondary | ICD-10-CM | POA: Diagnosis not present

## 2016-07-14 DIAGNOSIS — Z23 Encounter for immunization: Secondary | ICD-10-CM | POA: Diagnosis not present

## 2016-07-14 DIAGNOSIS — N2581 Secondary hyperparathyroidism of renal origin: Secondary | ICD-10-CM | POA: Diagnosis not present

## 2016-07-14 DIAGNOSIS — D509 Iron deficiency anemia, unspecified: Secondary | ICD-10-CM | POA: Diagnosis not present

## 2016-07-17 DIAGNOSIS — D631 Anemia in chronic kidney disease: Secondary | ICD-10-CM | POA: Diagnosis not present

## 2016-07-17 DIAGNOSIS — N2581 Secondary hyperparathyroidism of renal origin: Secondary | ICD-10-CM | POA: Diagnosis not present

## 2016-07-17 DIAGNOSIS — Z23 Encounter for immunization: Secondary | ICD-10-CM | POA: Diagnosis not present

## 2016-07-17 DIAGNOSIS — N186 End stage renal disease: Secondary | ICD-10-CM | POA: Diagnosis not present

## 2016-07-17 DIAGNOSIS — D509 Iron deficiency anemia, unspecified: Secondary | ICD-10-CM | POA: Diagnosis not present

## 2016-07-20 DIAGNOSIS — N2581 Secondary hyperparathyroidism of renal origin: Secondary | ICD-10-CM | POA: Diagnosis not present

## 2016-07-20 DIAGNOSIS — Z23 Encounter for immunization: Secondary | ICD-10-CM | POA: Diagnosis not present

## 2016-07-20 DIAGNOSIS — D509 Iron deficiency anemia, unspecified: Secondary | ICD-10-CM | POA: Diagnosis not present

## 2016-07-20 DIAGNOSIS — D631 Anemia in chronic kidney disease: Secondary | ICD-10-CM | POA: Diagnosis not present

## 2016-07-20 DIAGNOSIS — N186 End stage renal disease: Secondary | ICD-10-CM | POA: Diagnosis not present

## 2016-07-22 DIAGNOSIS — D631 Anemia in chronic kidney disease: Secondary | ICD-10-CM | POA: Diagnosis not present

## 2016-07-22 DIAGNOSIS — N2581 Secondary hyperparathyroidism of renal origin: Secondary | ICD-10-CM | POA: Diagnosis not present

## 2016-07-22 DIAGNOSIS — N186 End stage renal disease: Secondary | ICD-10-CM | POA: Diagnosis not present

## 2016-07-22 DIAGNOSIS — D509 Iron deficiency anemia, unspecified: Secondary | ICD-10-CM | POA: Diagnosis not present

## 2016-07-22 DIAGNOSIS — Z23 Encounter for immunization: Secondary | ICD-10-CM | POA: Diagnosis not present

## 2016-07-23 DIAGNOSIS — I129 Hypertensive chronic kidney disease with stage 1 through stage 4 chronic kidney disease, or unspecified chronic kidney disease: Secondary | ICD-10-CM | POA: Diagnosis not present

## 2016-07-23 DIAGNOSIS — N186 End stage renal disease: Secondary | ICD-10-CM | POA: Diagnosis not present

## 2016-07-23 DIAGNOSIS — Z992 Dependence on renal dialysis: Secondary | ICD-10-CM | POA: Diagnosis not present

## 2016-07-24 DIAGNOSIS — N186 End stage renal disease: Secondary | ICD-10-CM | POA: Diagnosis not present

## 2016-07-24 DIAGNOSIS — N2581 Secondary hyperparathyroidism of renal origin: Secondary | ICD-10-CM | POA: Diagnosis not present

## 2016-07-24 DIAGNOSIS — D631 Anemia in chronic kidney disease: Secondary | ICD-10-CM | POA: Diagnosis not present

## 2016-07-24 DIAGNOSIS — D509 Iron deficiency anemia, unspecified: Secondary | ICD-10-CM | POA: Diagnosis not present

## 2016-07-29 DIAGNOSIS — N2581 Secondary hyperparathyroidism of renal origin: Secondary | ICD-10-CM | POA: Diagnosis not present

## 2016-07-29 DIAGNOSIS — D509 Iron deficiency anemia, unspecified: Secondary | ICD-10-CM | POA: Diagnosis not present

## 2016-07-29 DIAGNOSIS — N186 End stage renal disease: Secondary | ICD-10-CM | POA: Diagnosis not present

## 2016-07-29 DIAGNOSIS — D631 Anemia in chronic kidney disease: Secondary | ICD-10-CM | POA: Diagnosis not present

## 2016-07-31 DIAGNOSIS — N186 End stage renal disease: Secondary | ICD-10-CM | POA: Diagnosis not present

## 2016-07-31 DIAGNOSIS — N2581 Secondary hyperparathyroidism of renal origin: Secondary | ICD-10-CM | POA: Diagnosis not present

## 2016-07-31 DIAGNOSIS — D509 Iron deficiency anemia, unspecified: Secondary | ICD-10-CM | POA: Diagnosis not present

## 2016-07-31 DIAGNOSIS — D631 Anemia in chronic kidney disease: Secondary | ICD-10-CM | POA: Diagnosis not present

## 2016-08-03 DIAGNOSIS — N2581 Secondary hyperparathyroidism of renal origin: Secondary | ICD-10-CM | POA: Diagnosis not present

## 2016-08-03 DIAGNOSIS — D631 Anemia in chronic kidney disease: Secondary | ICD-10-CM | POA: Diagnosis not present

## 2016-08-03 DIAGNOSIS — N186 End stage renal disease: Secondary | ICD-10-CM | POA: Diagnosis not present

## 2016-08-03 DIAGNOSIS — D509 Iron deficiency anemia, unspecified: Secondary | ICD-10-CM | POA: Diagnosis not present

## 2016-08-05 DIAGNOSIS — D509 Iron deficiency anemia, unspecified: Secondary | ICD-10-CM | POA: Diagnosis not present

## 2016-08-05 DIAGNOSIS — N2581 Secondary hyperparathyroidism of renal origin: Secondary | ICD-10-CM | POA: Diagnosis not present

## 2016-08-05 DIAGNOSIS — N186 End stage renal disease: Secondary | ICD-10-CM | POA: Diagnosis not present

## 2016-08-05 DIAGNOSIS — D631 Anemia in chronic kidney disease: Secondary | ICD-10-CM | POA: Diagnosis not present

## 2016-08-07 DIAGNOSIS — D509 Iron deficiency anemia, unspecified: Secondary | ICD-10-CM | POA: Diagnosis not present

## 2016-08-07 DIAGNOSIS — N186 End stage renal disease: Secondary | ICD-10-CM | POA: Diagnosis not present

## 2016-08-07 DIAGNOSIS — N2581 Secondary hyperparathyroidism of renal origin: Secondary | ICD-10-CM | POA: Diagnosis not present

## 2016-08-07 DIAGNOSIS — D631 Anemia in chronic kidney disease: Secondary | ICD-10-CM | POA: Diagnosis not present

## 2016-08-10 DIAGNOSIS — N186 End stage renal disease: Secondary | ICD-10-CM | POA: Diagnosis not present

## 2016-08-10 DIAGNOSIS — N2581 Secondary hyperparathyroidism of renal origin: Secondary | ICD-10-CM | POA: Diagnosis not present

## 2016-08-10 DIAGNOSIS — D509 Iron deficiency anemia, unspecified: Secondary | ICD-10-CM | POA: Diagnosis not present

## 2016-08-10 DIAGNOSIS — D631 Anemia in chronic kidney disease: Secondary | ICD-10-CM | POA: Diagnosis not present

## 2016-08-12 DIAGNOSIS — N186 End stage renal disease: Secondary | ICD-10-CM | POA: Diagnosis not present

## 2016-08-12 DIAGNOSIS — N2581 Secondary hyperparathyroidism of renal origin: Secondary | ICD-10-CM | POA: Diagnosis not present

## 2016-08-12 DIAGNOSIS — D631 Anemia in chronic kidney disease: Secondary | ICD-10-CM | POA: Diagnosis not present

## 2016-08-12 DIAGNOSIS — D509 Iron deficiency anemia, unspecified: Secondary | ICD-10-CM | POA: Diagnosis not present

## 2016-08-14 DIAGNOSIS — N186 End stage renal disease: Secondary | ICD-10-CM | POA: Diagnosis not present

## 2016-08-14 DIAGNOSIS — D631 Anemia in chronic kidney disease: Secondary | ICD-10-CM | POA: Diagnosis not present

## 2016-08-14 DIAGNOSIS — N2581 Secondary hyperparathyroidism of renal origin: Secondary | ICD-10-CM | POA: Diagnosis not present

## 2016-08-14 DIAGNOSIS — D509 Iron deficiency anemia, unspecified: Secondary | ICD-10-CM | POA: Diagnosis not present

## 2016-08-16 DIAGNOSIS — N2581 Secondary hyperparathyroidism of renal origin: Secondary | ICD-10-CM | POA: Diagnosis not present

## 2016-08-16 DIAGNOSIS — N186 End stage renal disease: Secondary | ICD-10-CM | POA: Diagnosis not present

## 2016-08-16 DIAGNOSIS — D509 Iron deficiency anemia, unspecified: Secondary | ICD-10-CM | POA: Diagnosis not present

## 2016-08-16 DIAGNOSIS — D631 Anemia in chronic kidney disease: Secondary | ICD-10-CM | POA: Diagnosis not present

## 2016-08-19 DIAGNOSIS — N2581 Secondary hyperparathyroidism of renal origin: Secondary | ICD-10-CM | POA: Diagnosis not present

## 2016-08-19 DIAGNOSIS — D631 Anemia in chronic kidney disease: Secondary | ICD-10-CM | POA: Diagnosis not present

## 2016-08-19 DIAGNOSIS — D509 Iron deficiency anemia, unspecified: Secondary | ICD-10-CM | POA: Diagnosis not present

## 2016-08-19 DIAGNOSIS — N186 End stage renal disease: Secondary | ICD-10-CM | POA: Diagnosis not present

## 2016-08-20 DIAGNOSIS — D509 Iron deficiency anemia, unspecified: Secondary | ICD-10-CM | POA: Diagnosis not present

## 2016-08-20 DIAGNOSIS — N186 End stage renal disease: Secondary | ICD-10-CM | POA: Diagnosis not present

## 2016-08-20 DIAGNOSIS — D631 Anemia in chronic kidney disease: Secondary | ICD-10-CM | POA: Diagnosis not present

## 2016-08-20 DIAGNOSIS — N2581 Secondary hyperparathyroidism of renal origin: Secondary | ICD-10-CM | POA: Diagnosis not present

## 2016-08-23 DIAGNOSIS — N2581 Secondary hyperparathyroidism of renal origin: Secondary | ICD-10-CM | POA: Diagnosis not present

## 2016-08-23 DIAGNOSIS — Z992 Dependence on renal dialysis: Secondary | ICD-10-CM | POA: Diagnosis not present

## 2016-08-23 DIAGNOSIS — D631 Anemia in chronic kidney disease: Secondary | ICD-10-CM | POA: Diagnosis not present

## 2016-08-23 DIAGNOSIS — N186 End stage renal disease: Secondary | ICD-10-CM | POA: Diagnosis not present

## 2016-08-23 DIAGNOSIS — I129 Hypertensive chronic kidney disease with stage 1 through stage 4 chronic kidney disease, or unspecified chronic kidney disease: Secondary | ICD-10-CM | POA: Diagnosis not present

## 2016-08-23 DIAGNOSIS — D509 Iron deficiency anemia, unspecified: Secondary | ICD-10-CM | POA: Diagnosis not present

## 2016-08-27 DIAGNOSIS — D631 Anemia in chronic kidney disease: Secondary | ICD-10-CM | POA: Diagnosis not present

## 2016-08-27 DIAGNOSIS — N186 End stage renal disease: Secondary | ICD-10-CM | POA: Diagnosis not present

## 2016-08-27 DIAGNOSIS — D72818 Other decreased white blood cell count: Secondary | ICD-10-CM | POA: Diagnosis not present

## 2016-08-27 DIAGNOSIS — D509 Iron deficiency anemia, unspecified: Secondary | ICD-10-CM | POA: Diagnosis not present

## 2016-08-27 DIAGNOSIS — N2581 Secondary hyperparathyroidism of renal origin: Secondary | ICD-10-CM | POA: Diagnosis not present

## 2016-08-28 DIAGNOSIS — D509 Iron deficiency anemia, unspecified: Secondary | ICD-10-CM | POA: Diagnosis not present

## 2016-08-28 DIAGNOSIS — D631 Anemia in chronic kidney disease: Secondary | ICD-10-CM | POA: Diagnosis not present

## 2016-08-28 DIAGNOSIS — D72818 Other decreased white blood cell count: Secondary | ICD-10-CM | POA: Diagnosis not present

## 2016-08-28 DIAGNOSIS — N2581 Secondary hyperparathyroidism of renal origin: Secondary | ICD-10-CM | POA: Diagnosis not present

## 2016-08-28 DIAGNOSIS — N186 End stage renal disease: Secondary | ICD-10-CM | POA: Diagnosis not present

## 2016-08-31 DIAGNOSIS — D72818 Other decreased white blood cell count: Secondary | ICD-10-CM | POA: Diagnosis not present

## 2016-08-31 DIAGNOSIS — D631 Anemia in chronic kidney disease: Secondary | ICD-10-CM | POA: Diagnosis not present

## 2016-08-31 DIAGNOSIS — N186 End stage renal disease: Secondary | ICD-10-CM | POA: Diagnosis not present

## 2016-08-31 DIAGNOSIS — N2581 Secondary hyperparathyroidism of renal origin: Secondary | ICD-10-CM | POA: Diagnosis not present

## 2016-08-31 DIAGNOSIS — D509 Iron deficiency anemia, unspecified: Secondary | ICD-10-CM | POA: Diagnosis not present

## 2016-09-02 DIAGNOSIS — D631 Anemia in chronic kidney disease: Secondary | ICD-10-CM | POA: Diagnosis not present

## 2016-09-02 DIAGNOSIS — D72818 Other decreased white blood cell count: Secondary | ICD-10-CM | POA: Diagnosis not present

## 2016-09-02 DIAGNOSIS — N2581 Secondary hyperparathyroidism of renal origin: Secondary | ICD-10-CM | POA: Diagnosis not present

## 2016-09-02 DIAGNOSIS — D509 Iron deficiency anemia, unspecified: Secondary | ICD-10-CM | POA: Diagnosis not present

## 2016-09-02 DIAGNOSIS — N186 End stage renal disease: Secondary | ICD-10-CM | POA: Diagnosis not present

## 2016-09-04 DIAGNOSIS — D631 Anemia in chronic kidney disease: Secondary | ICD-10-CM | POA: Diagnosis not present

## 2016-09-04 DIAGNOSIS — N186 End stage renal disease: Secondary | ICD-10-CM | POA: Diagnosis not present

## 2016-09-04 DIAGNOSIS — D509 Iron deficiency anemia, unspecified: Secondary | ICD-10-CM | POA: Diagnosis not present

## 2016-09-04 DIAGNOSIS — N2581 Secondary hyperparathyroidism of renal origin: Secondary | ICD-10-CM | POA: Diagnosis not present

## 2016-09-04 DIAGNOSIS — D72818 Other decreased white blood cell count: Secondary | ICD-10-CM | POA: Diagnosis not present

## 2016-09-07 DIAGNOSIS — D72818 Other decreased white blood cell count: Secondary | ICD-10-CM | POA: Diagnosis not present

## 2016-09-07 DIAGNOSIS — D631 Anemia in chronic kidney disease: Secondary | ICD-10-CM | POA: Diagnosis not present

## 2016-09-07 DIAGNOSIS — N2581 Secondary hyperparathyroidism of renal origin: Secondary | ICD-10-CM | POA: Diagnosis not present

## 2016-09-07 DIAGNOSIS — D509 Iron deficiency anemia, unspecified: Secondary | ICD-10-CM | POA: Diagnosis not present

## 2016-09-07 DIAGNOSIS — N186 End stage renal disease: Secondary | ICD-10-CM | POA: Diagnosis not present

## 2016-09-09 DIAGNOSIS — D509 Iron deficiency anemia, unspecified: Secondary | ICD-10-CM | POA: Diagnosis not present

## 2016-09-09 DIAGNOSIS — N2581 Secondary hyperparathyroidism of renal origin: Secondary | ICD-10-CM | POA: Diagnosis not present

## 2016-09-09 DIAGNOSIS — D631 Anemia in chronic kidney disease: Secondary | ICD-10-CM | POA: Diagnosis not present

## 2016-09-09 DIAGNOSIS — N186 End stage renal disease: Secondary | ICD-10-CM | POA: Diagnosis not present

## 2016-09-09 DIAGNOSIS — D72818 Other decreased white blood cell count: Secondary | ICD-10-CM | POA: Diagnosis not present

## 2016-09-11 DIAGNOSIS — D72818 Other decreased white blood cell count: Secondary | ICD-10-CM | POA: Diagnosis not present

## 2016-09-11 DIAGNOSIS — D509 Iron deficiency anemia, unspecified: Secondary | ICD-10-CM | POA: Diagnosis not present

## 2016-09-11 DIAGNOSIS — N2581 Secondary hyperparathyroidism of renal origin: Secondary | ICD-10-CM | POA: Diagnosis not present

## 2016-09-11 DIAGNOSIS — N186 End stage renal disease: Secondary | ICD-10-CM | POA: Diagnosis not present

## 2016-09-11 DIAGNOSIS — D631 Anemia in chronic kidney disease: Secondary | ICD-10-CM | POA: Diagnosis not present

## 2016-09-14 DIAGNOSIS — N2581 Secondary hyperparathyroidism of renal origin: Secondary | ICD-10-CM | POA: Diagnosis not present

## 2016-09-14 DIAGNOSIS — D509 Iron deficiency anemia, unspecified: Secondary | ICD-10-CM | POA: Diagnosis not present

## 2016-09-14 DIAGNOSIS — N186 End stage renal disease: Secondary | ICD-10-CM | POA: Diagnosis not present

## 2016-09-14 DIAGNOSIS — D631 Anemia in chronic kidney disease: Secondary | ICD-10-CM | POA: Diagnosis not present

## 2016-09-14 DIAGNOSIS — D72818 Other decreased white blood cell count: Secondary | ICD-10-CM | POA: Diagnosis not present

## 2016-09-16 DIAGNOSIS — D631 Anemia in chronic kidney disease: Secondary | ICD-10-CM | POA: Diagnosis not present

## 2016-09-16 DIAGNOSIS — D509 Iron deficiency anemia, unspecified: Secondary | ICD-10-CM | POA: Diagnosis not present

## 2016-09-16 DIAGNOSIS — N186 End stage renal disease: Secondary | ICD-10-CM | POA: Diagnosis not present

## 2016-09-16 DIAGNOSIS — D72818 Other decreased white blood cell count: Secondary | ICD-10-CM | POA: Diagnosis not present

## 2016-09-16 DIAGNOSIS — N2581 Secondary hyperparathyroidism of renal origin: Secondary | ICD-10-CM | POA: Diagnosis not present

## 2016-09-18 DIAGNOSIS — N2581 Secondary hyperparathyroidism of renal origin: Secondary | ICD-10-CM | POA: Diagnosis not present

## 2016-09-18 DIAGNOSIS — D72818 Other decreased white blood cell count: Secondary | ICD-10-CM | POA: Diagnosis not present

## 2016-09-18 DIAGNOSIS — D509 Iron deficiency anemia, unspecified: Secondary | ICD-10-CM | POA: Diagnosis not present

## 2016-09-18 DIAGNOSIS — D631 Anemia in chronic kidney disease: Secondary | ICD-10-CM | POA: Diagnosis not present

## 2016-09-18 DIAGNOSIS — N186 End stage renal disease: Secondary | ICD-10-CM | POA: Diagnosis not present

## 2016-09-21 DIAGNOSIS — D509 Iron deficiency anemia, unspecified: Secondary | ICD-10-CM | POA: Diagnosis not present

## 2016-09-21 DIAGNOSIS — D72818 Other decreased white blood cell count: Secondary | ICD-10-CM | POA: Diagnosis not present

## 2016-09-21 DIAGNOSIS — D631 Anemia in chronic kidney disease: Secondary | ICD-10-CM | POA: Diagnosis not present

## 2016-09-21 DIAGNOSIS — N186 End stage renal disease: Secondary | ICD-10-CM | POA: Diagnosis not present

## 2016-09-21 DIAGNOSIS — N2581 Secondary hyperparathyroidism of renal origin: Secondary | ICD-10-CM | POA: Diagnosis not present

## 2016-09-23 DIAGNOSIS — Z992 Dependence on renal dialysis: Secondary | ICD-10-CM | POA: Diagnosis not present

## 2016-09-23 DIAGNOSIS — N186 End stage renal disease: Secondary | ICD-10-CM | POA: Diagnosis not present

## 2016-09-23 DIAGNOSIS — D509 Iron deficiency anemia, unspecified: Secondary | ICD-10-CM | POA: Diagnosis not present

## 2016-09-23 DIAGNOSIS — I129 Hypertensive chronic kidney disease with stage 1 through stage 4 chronic kidney disease, or unspecified chronic kidney disease: Secondary | ICD-10-CM | POA: Diagnosis not present

## 2016-09-23 DIAGNOSIS — D631 Anemia in chronic kidney disease: Secondary | ICD-10-CM | POA: Diagnosis not present

## 2016-09-23 DIAGNOSIS — N2581 Secondary hyperparathyroidism of renal origin: Secondary | ICD-10-CM | POA: Diagnosis not present

## 2016-09-23 DIAGNOSIS — D72818 Other decreased white blood cell count: Secondary | ICD-10-CM | POA: Diagnosis not present

## 2016-09-25 DIAGNOSIS — N2581 Secondary hyperparathyroidism of renal origin: Secondary | ICD-10-CM | POA: Diagnosis not present

## 2016-09-25 DIAGNOSIS — D631 Anemia in chronic kidney disease: Secondary | ICD-10-CM | POA: Diagnosis not present

## 2016-09-25 DIAGNOSIS — D509 Iron deficiency anemia, unspecified: Secondary | ICD-10-CM | POA: Diagnosis not present

## 2016-09-25 DIAGNOSIS — N186 End stage renal disease: Secondary | ICD-10-CM | POA: Diagnosis not present

## 2016-09-29 DIAGNOSIS — D509 Iron deficiency anemia, unspecified: Secondary | ICD-10-CM | POA: Diagnosis not present

## 2016-09-29 DIAGNOSIS — N2581 Secondary hyperparathyroidism of renal origin: Secondary | ICD-10-CM | POA: Diagnosis not present

## 2016-09-29 DIAGNOSIS — N186 End stage renal disease: Secondary | ICD-10-CM | POA: Diagnosis not present

## 2016-09-29 DIAGNOSIS — D631 Anemia in chronic kidney disease: Secondary | ICD-10-CM | POA: Diagnosis not present

## 2016-09-30 DIAGNOSIS — D509 Iron deficiency anemia, unspecified: Secondary | ICD-10-CM | POA: Diagnosis not present

## 2016-09-30 DIAGNOSIS — N2581 Secondary hyperparathyroidism of renal origin: Secondary | ICD-10-CM | POA: Diagnosis not present

## 2016-09-30 DIAGNOSIS — D631 Anemia in chronic kidney disease: Secondary | ICD-10-CM | POA: Diagnosis not present

## 2016-09-30 DIAGNOSIS — N186 End stage renal disease: Secondary | ICD-10-CM | POA: Diagnosis not present

## 2016-10-02 DIAGNOSIS — N186 End stage renal disease: Secondary | ICD-10-CM | POA: Diagnosis not present

## 2016-10-02 DIAGNOSIS — D631 Anemia in chronic kidney disease: Secondary | ICD-10-CM | POA: Diagnosis not present

## 2016-10-02 DIAGNOSIS — D509 Iron deficiency anemia, unspecified: Secondary | ICD-10-CM | POA: Diagnosis not present

## 2016-10-02 DIAGNOSIS — N2581 Secondary hyperparathyroidism of renal origin: Secondary | ICD-10-CM | POA: Diagnosis not present

## 2016-10-05 DIAGNOSIS — D509 Iron deficiency anemia, unspecified: Secondary | ICD-10-CM | POA: Diagnosis not present

## 2016-10-05 DIAGNOSIS — N2581 Secondary hyperparathyroidism of renal origin: Secondary | ICD-10-CM | POA: Diagnosis not present

## 2016-10-05 DIAGNOSIS — D631 Anemia in chronic kidney disease: Secondary | ICD-10-CM | POA: Diagnosis not present

## 2016-10-05 DIAGNOSIS — N186 End stage renal disease: Secondary | ICD-10-CM | POA: Diagnosis not present

## 2016-10-07 DIAGNOSIS — D631 Anemia in chronic kidney disease: Secondary | ICD-10-CM | POA: Diagnosis not present

## 2016-10-07 DIAGNOSIS — D509 Iron deficiency anemia, unspecified: Secondary | ICD-10-CM | POA: Diagnosis not present

## 2016-10-07 DIAGNOSIS — N186 End stage renal disease: Secondary | ICD-10-CM | POA: Diagnosis not present

## 2016-10-07 DIAGNOSIS — N2581 Secondary hyperparathyroidism of renal origin: Secondary | ICD-10-CM | POA: Diagnosis not present

## 2016-10-09 DIAGNOSIS — D631 Anemia in chronic kidney disease: Secondary | ICD-10-CM | POA: Diagnosis not present

## 2016-10-09 DIAGNOSIS — N186 End stage renal disease: Secondary | ICD-10-CM | POA: Diagnosis not present

## 2016-10-09 DIAGNOSIS — D509 Iron deficiency anemia, unspecified: Secondary | ICD-10-CM | POA: Diagnosis not present

## 2016-10-09 DIAGNOSIS — N2581 Secondary hyperparathyroidism of renal origin: Secondary | ICD-10-CM | POA: Diagnosis not present

## 2016-10-12 DIAGNOSIS — N186 End stage renal disease: Secondary | ICD-10-CM | POA: Diagnosis not present

## 2016-10-12 DIAGNOSIS — D631 Anemia in chronic kidney disease: Secondary | ICD-10-CM | POA: Diagnosis not present

## 2016-10-12 DIAGNOSIS — N2581 Secondary hyperparathyroidism of renal origin: Secondary | ICD-10-CM | POA: Diagnosis not present

## 2016-10-12 DIAGNOSIS — D509 Iron deficiency anemia, unspecified: Secondary | ICD-10-CM | POA: Diagnosis not present

## 2016-10-14 DIAGNOSIS — N186 End stage renal disease: Secondary | ICD-10-CM | POA: Diagnosis not present

## 2016-10-14 DIAGNOSIS — N2581 Secondary hyperparathyroidism of renal origin: Secondary | ICD-10-CM | POA: Diagnosis not present

## 2016-10-14 DIAGNOSIS — D509 Iron deficiency anemia, unspecified: Secondary | ICD-10-CM | POA: Diagnosis not present

## 2016-10-14 DIAGNOSIS — D631 Anemia in chronic kidney disease: Secondary | ICD-10-CM | POA: Diagnosis not present

## 2016-10-16 DIAGNOSIS — D509 Iron deficiency anemia, unspecified: Secondary | ICD-10-CM | POA: Diagnosis not present

## 2016-10-16 DIAGNOSIS — N2581 Secondary hyperparathyroidism of renal origin: Secondary | ICD-10-CM | POA: Diagnosis not present

## 2016-10-16 DIAGNOSIS — N186 End stage renal disease: Secondary | ICD-10-CM | POA: Diagnosis not present

## 2016-10-16 DIAGNOSIS — D631 Anemia in chronic kidney disease: Secondary | ICD-10-CM | POA: Diagnosis not present

## 2016-10-19 DIAGNOSIS — N2581 Secondary hyperparathyroidism of renal origin: Secondary | ICD-10-CM | POA: Diagnosis not present

## 2016-10-19 DIAGNOSIS — D509 Iron deficiency anemia, unspecified: Secondary | ICD-10-CM | POA: Diagnosis not present

## 2016-10-19 DIAGNOSIS — N186 End stage renal disease: Secondary | ICD-10-CM | POA: Diagnosis not present

## 2016-10-19 DIAGNOSIS — D631 Anemia in chronic kidney disease: Secondary | ICD-10-CM | POA: Diagnosis not present

## 2016-10-21 DIAGNOSIS — N2581 Secondary hyperparathyroidism of renal origin: Secondary | ICD-10-CM | POA: Diagnosis not present

## 2016-10-21 DIAGNOSIS — I129 Hypertensive chronic kidney disease with stage 1 through stage 4 chronic kidney disease, or unspecified chronic kidney disease: Secondary | ICD-10-CM | POA: Diagnosis not present

## 2016-10-21 DIAGNOSIS — D509 Iron deficiency anemia, unspecified: Secondary | ICD-10-CM | POA: Diagnosis not present

## 2016-10-21 DIAGNOSIS — N186 End stage renal disease: Secondary | ICD-10-CM | POA: Diagnosis not present

## 2016-10-21 DIAGNOSIS — Z992 Dependence on renal dialysis: Secondary | ICD-10-CM | POA: Diagnosis not present

## 2016-10-21 DIAGNOSIS — D631 Anemia in chronic kidney disease: Secondary | ICD-10-CM | POA: Diagnosis not present

## 2016-10-23 DIAGNOSIS — D509 Iron deficiency anemia, unspecified: Secondary | ICD-10-CM | POA: Diagnosis not present

## 2016-10-23 DIAGNOSIS — N186 End stage renal disease: Secondary | ICD-10-CM | POA: Diagnosis not present

## 2016-10-23 DIAGNOSIS — N2581 Secondary hyperparathyroidism of renal origin: Secondary | ICD-10-CM | POA: Diagnosis not present

## 2016-10-23 DIAGNOSIS — D631 Anemia in chronic kidney disease: Secondary | ICD-10-CM | POA: Diagnosis not present

## 2016-10-26 ENCOUNTER — Encounter: Payer: Self-pay | Admitting: *Deleted

## 2016-10-26 DIAGNOSIS — N186 End stage renal disease: Secondary | ICD-10-CM | POA: Diagnosis not present

## 2016-10-26 DIAGNOSIS — D631 Anemia in chronic kidney disease: Secondary | ICD-10-CM | POA: Diagnosis not present

## 2016-10-26 DIAGNOSIS — D509 Iron deficiency anemia, unspecified: Secondary | ICD-10-CM | POA: Diagnosis not present

## 2016-10-26 DIAGNOSIS — N2581 Secondary hyperparathyroidism of renal origin: Secondary | ICD-10-CM | POA: Diagnosis not present

## 2016-10-27 ENCOUNTER — Ambulatory Visit (INDEPENDENT_AMBULATORY_CARE_PROVIDER_SITE_OTHER): Payer: Medicare Other | Admitting: Diagnostic Neuroimaging

## 2016-10-27 ENCOUNTER — Encounter: Payer: Self-pay | Admitting: Diagnostic Neuroimaging

## 2016-10-27 VITALS — BP 147/87 | HR 83 | Ht 67.0 in | Wt 140.6 lb

## 2016-10-27 DIAGNOSIS — G43109 Migraine with aura, not intractable, without status migrainosus: Secondary | ICD-10-CM

## 2016-10-27 DIAGNOSIS — R51 Headache: Secondary | ICD-10-CM | POA: Diagnosis not present

## 2016-10-27 DIAGNOSIS — T8090XS Unspecified complication following infusion and therapeutic injection, sequela: Secondary | ICD-10-CM

## 2016-10-27 DIAGNOSIS — R519 Headache, unspecified: Secondary | ICD-10-CM

## 2016-10-27 MED ORDER — BUTALBITAL-APAP-CAFFEINE 50-325-40 MG PO TABS: 1.0000 | ORAL_TABLET | Freq: Every day | ORAL | 2 refills | Status: DC | PRN

## 2016-10-27 NOTE — Patient Instructions (Signed)
Thank you for coming to see [place] at [facility]. I hope we have been able to provide you high quality care today.  You may receive a patient satisfaction survey over the next few weeks. We would appreciate your feedback and comments so that we may continue to improve ourselves and the health of our patients.  - I will check MRI brain  - Try tylenol or fioricet tab on days of dialysis for headache treatment  - To prevent or relieve headaches, try the following:   Cool Compress. Lie down and place a cool compress on your head.   Avoid headache triggers. If certain foods or odors seem to have triggered your migraines in the past, avoid them. A headache diary might help you identify triggers.   Include physical activity in your daily routine.   Manage stress. Find healthy ways to cope with the stressors, such as delegating tasks on your to-do list.   Practice relaxation techniques. Try deep breathing, yoga, massage and visualization.   Eat regularly. Eating regularly scheduled meals and maintaining a healthy diet might help prevent headaches. Also, drink plenty of fluids.   Follow a regular sleep schedule. Sleep deprivation might contribute to headaches  Consider biofeedback. With this mind-body technique, you learn to control certain bodily functions - such as muscle tension, heart rate and blood pressure - to prevent headaches or reduce headache pain.    ~~~~~~~~~~~~~~~~~~~~~~~~~~~~~~~~~~~~~~~~~~~~~~~~~~~~~~~~~~~~~~~~~  DR. PENUMALLI'S GUIDE TO HAPPY AND HEALTHY LIVING These are some of my general health and wellness recommendations. Some of them may apply to you better than others. Please use common sense as you try these suggestions and feel free to ask me any questions.   ACTIVITY/FITNESS Mental, social, emotional and physical stimulation are very important for brain and body health. Try learning a new activity (arts, music, language, sports, games).  Keep  moving your body to the best of your abilities. You can do this at home, inside or outside, the park, community center, gym or anywhere you like. Consider a physical therapist or personal trainer to get started. Consider the app Sworkit. Fitness trackers such as smart-watches, smart-phones or Fitbits can help as well.   NUTRITION Eat more plants: colorful vegetables, nuts, seeds and berries.  Eat less sugar, salt, preservatives and processed foods.  Avoid toxins such as cigarettes and alcohol.  Drink water when you are thirsty. Warm water with a slice of lemon is an excellent morning drink to start the day.  Consider these websites for more information The Nutrition Source (https://www.henry-hernandez.biz/) Precision Nutrition (WindowBlog.ch)   RELAXATION Consider practicing mindfulness meditation or other relaxation techniques such as deep breathing, prayer, yoga, tai chi, massage. See website mindful.org or the apps Headspace or Calm to help get started.   SLEEP Try to get at least 7-8+ hours sleep per day. Regular exercise and reduced caffeine will help you sleep better. Practice good sleep hygeine techniques. See website sleep.org for more information.   PLANNING Prepare estate planning, living will, healthcare POA documents. Sometimes this is best planned with the help of an attorney. Theconversationproject.org and agingwithdignity.org are excellent resources.

## 2016-10-27 NOTE — Progress Notes (Signed)
[facility]  PATIENT: Candace Griffin DOB: [birth year]  REFERRING CLINICIAN: Deterding HISTORY FROM: patient  REASON FOR VISIT: new consult   HISTORICAL  CHIEF COMPLAINT:  Chief Complaint  Patient presents with  . Headache    rm 7, New Pt, "headaches since going back on dialysis x 2 years; back on kidney transplant list"    HISTORY OF PRESENT ILLNESS:   41 year old female here for evaluation of dialysis associated headaches.  Age 18 years old patient had hematuria, malaise, fatigue, was diagnosed with idiopathic glomerulonephritis. At that time she was treated with prednisone. Symptoms continue to progress and by 2010 she was on dialysis. In 2013 she received a kidney transplant. 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 Ever since that time in 2016 patient has been having fairly consistent headaches during her dialysis treatments on Monday, Wednesday, Friday. She describes a frontal, global, throbbing headache with photophobia. In the past few months headaches have increased in frequency, 4-5 times per week, including non-dialysis days. Patient is tried tramadol without relief. She tried Tylenol without relief. No other specific triggering or aggravating factors.  Patient does have history of migraine headaches from 2004. Patient also drinks excessive caffeine, averaging 4-6 [facility] use per day. She did stop [facility] usage approximately 30 days ago, with temporary increase in headaches but no significant decline in headaches.  REVIEW OF SYSTEMS: Full 14 system review of systems performed and negative with exception of: Shortness of breath ringing in ears diarrhea constipation headache too much sleep.  ALLERGIES: No Known Allergies  HOME MEDICATIONS: Outpatient Medications Prior to Visit  Medication Sig Dispense Refill  . amLODipine (NORVASC) 10 MG tablet Take 10 mg by mouth every evening.  11  . carbamide  peroxide (DEBROX) 6.5 % otic solution Place 5 drops into the right ear 2 (two) times daily. 15 mL 0  . Darbepoetin Alfa (ARANESP) 200 MCG/0.4ML SOSY injection Inject 0.4 mLs (200 mcg total) into the vein every Tuesday with hemodialysis. 1.68 mL 0  . labetalol (NORMODYNE) 300 MG tablet Take 300 mg by mouth 3 (three) times daily.    . multivitamin (RENA-VIT) TABS tablet Take 1 tablet by mouth at bedtime.  0  . sevelamer (RENAGEL) 800 MG tablet Take 3,200 mg by mouth 3 (three) times daily with meals.    . cephALEXin (KEFLEX) 250 MG capsule Take 1 capsule (250 mg total) by mouth daily. On dialysis days, please take after dialysis. 7 capsule 0  . oxyCODONE-acetaminophen (PERCOCET) 5-325 MG tablet Take 1 tablet by mouth every 4 (four) hours as needed for severe pain. 30 tablet 0  . predniSONE (DELTASONE) 10 MG tablet Take 3 tablets or 30 mg by mouth daily for 7 days. (Patient not taking: Reported on 10/17/2015) 21 tablet 0   No facility-administered medications prior to visit.     PAST MEDICAL HISTORY: Past Medical History:  Diagnosis Date  . Chronic glomerulonephritis   . Dialysis patient ([place])   . End stage renal disease ([place])   . GERD (gastroesophageal reflux disease)   . History of renal transplant 10-10-11  . Hypertension     PAST SURGICAL HISTORY: Past Surgical History:  Procedure Laterality Date  . COLPOSCOPY  2016  . diaylsis shunt Right    arm  . KIDNEY TRANSPLANT  Oct 10, 2011   deceased donor kidney    FAMILY HISTORY: Family History  Problem Relation Age of Onset  . Diabetes Father   . Diabetes Paternal Aunt   .  Diabetes Paternal Grandmother   . Alcohol abuse Paternal Grandmother   . Alcohol abuse Paternal Uncle   . Cancer Maternal Grandmother     breast  . Alcohol abuse Paternal Grandfather     SOCIAL HISTORY:  Social History   Social History  . Marital status: Single    Spouse name: N/A  . Number of children: 3  . Years of education: 12   Occupational History    .      NA   Social History Main Topics  . Smoking status: Never Smoker  . Smokeless tobacco: Never Used  . Alcohol use No  . Drug use: No  . Sexual activity: Not Currently    Birth control/ protection: None   Other Topics Concern  . Not on file   Social History Narrative   Lives at home with children   Caffeine - 10/27/16 "addicted to [facility], drank all day long", currently Mtn Dew 12 oz/week     PHYSICAL EXAM  GENERAL EXAM/CONSTITUTIONAL: Vitals:  Vitals:   10/27/16 0858  BP: (!) 147/87  Pulse: 83  Weight: 140 lb 9.6 oz (63.8 kg)  Height: 5\' 7"  (1.702 m)     Body mass index is 22.02 kg/m.  Visual Acuity Screening   Right eye Left eye Both eyes  Without correction:     With correction: 20/30 20/30      Patient is in no distress; well developed, nourished and groomed; neck is supple  CARDIOVASCULAR:  Examination of carotid arteries is normal; no carotid bruits  Regular rate and rhythm, no murmurs  Examination of peripheral vascular system by observation and palpation is normal  RIGHT FOREARM AV FISTULA WITH PALPABLE THRILL  EYES:  Ophthalmoscopic exam of optic discs and posterior segments is normal; no papilledema or hemorrhages  MUSCULOSKELETAL:  Gait, strength, tone, movements noted in Neurologic exam below  NEUROLOGIC: MENTAL STATUS:  No flowsheet data found.  awake, alert, oriented to person, place and time  recent and remote memory intact  normal attention and concentration  language fluent, comprehension intact, naming intact,   fund of knowledge appropriate  CRANIAL NERVE:   2nd - no papilledema on fundoscopic exam  2nd, 3rd, 4th, 6th - pupils equal and reactive to light, visual fields full to confrontation, extraocular muscles intact, no nystagmus  5th - facial sensation symmetric  7th - facial strength symmetric  8th - hearing intact  9th - palate elevates symmetrically, uvula midline  11th - shoulder shrug  symmetric  12th - tongue protrusion midline  MOTOR:   normal bulk and tone, full strength in the BUE, BLE  SENSORY:   normal and symmetric to light touch, temperature, vibration  COORDINATION:   finger-nose-finger, fine finger movements normal  REFLEXES:   deep tendon reflexes present and symmetric  GAIT/STATION:   narrow based gait; able to walk tandem; romberg is negative    DIAGNOSTIC DATA (LABS, IMAGING, TESTING) - I reviewed patient records, labs, notes, testing and imaging myself where available.  Lab Results  Component Value Date   WBC 4.9 02/10/2016   HGB 10.0 (L) 02/10/2016   HCT 31.9 (L) 02/10/2016   MCV 94.7 02/10/2016   PLT 216 02/10/2016      Component Value Date/Time   NA 139 02/10/2016 1200   K 4.7 02/10/2016 1200   CL 98 (L) 02/10/2016 1200   CO2 25 02/10/2016 1200   GLUCOSE 57 (L) 02/10/2016 1200   BUN 49 (H) 02/10/2016 1200   CREATININE 14.70 (  H) 02/10/2016 1200   CALCIUM 8.5 (L) 02/10/2016 1200   CALCIUM 8.4 (L) 09/18/2014 1500   PROT 7.8 10/17/2015 0142   ALBUMIN 3.3 (L) 10/17/2015 0142   AST 87 (H) 10/17/2015 0142   ALT 53 10/17/2015 0142   ALKPHOS 157 (H) 10/17/2015 0142   BILITOT 0.4 10/17/2015 0142   GFRNONAA 3 (L) 02/10/2016 1200   GFRAA 3 (L) 02/10/2016 1200   No results found for: CHOL, HDL, LDLCALC, LDLDIRECT, TRIG, CHOLHDL No results found for: HGBA1C No results found for: VITAMINB12 Lab Results  Component Value Date   TSH 0.419 Test methodology is 3rd generation TSH 12/12/2007       ASSESSMENT AND PLAN  41 y.o. year old female here with Glomerulonephritis, status post kidney transplants, status post transplant rejection, now back on hemodialysis since 2016. Now with dialysis associated headaches since 2016, and increasing headaches in the past few months. We'll check MRI brain to rule out secondary causes. We'll try short course of Tylenol or Fioricet for headache treatment. May consider migraine. On a medication  future, but patient would like to hold off for now.   Ddx: dialysis associated headaches vs migraine vs secondary headaches  1. Bilateral headaches   2. Complication of hemodialysis, sequela   3. Migraine with aura and without status migrainosus, not intractable      PLAN: - check MRI brain - consider migraine preventative medications (topiramate or depakote); patient wants to hold off for now - try headache rescue medications (tylenol or fioricet) during dialysis days  Orders Placed This Encounter  Procedures  . MR BRAIN WO CONTRAST   Meds ordered this encounter  Medications  . butalbital-acetaminophen-caffeine (FIORICET, ESGIC) 50-325-40 MG tablet    Sig: Take 1 tablet by mouth daily as needed for headache (on dialysis days for headaches).    Dispense:  30 tablet    Refill:  2   Return in about 3 months (around 01/27/2017).    Penni Bombard, MD 12/29/2618, 3:55 AM Certified in Neurology, Neurophysiology and Neuroimaging  [facility] [address] [place], [place] [address] [phone]

## 2016-10-28 DIAGNOSIS — D509 Iron deficiency anemia, unspecified: Secondary | ICD-10-CM | POA: Diagnosis not present

## 2016-10-28 DIAGNOSIS — N2581 Secondary hyperparathyroidism of renal origin: Secondary | ICD-10-CM | POA: Diagnosis not present

## 2016-10-28 DIAGNOSIS — N186 End stage renal disease: Secondary | ICD-10-CM | POA: Diagnosis not present

## 2016-10-28 DIAGNOSIS — D631 Anemia in chronic kidney disease: Secondary | ICD-10-CM | POA: Diagnosis not present

## 2016-10-30 DIAGNOSIS — N2581 Secondary hyperparathyroidism of renal origin: Secondary | ICD-10-CM | POA: Diagnosis not present

## 2016-10-30 DIAGNOSIS — N186 End stage renal disease: Secondary | ICD-10-CM | POA: Diagnosis not present

## 2016-10-30 DIAGNOSIS — D509 Iron deficiency anemia, unspecified: Secondary | ICD-10-CM | POA: Diagnosis not present

## 2016-10-30 DIAGNOSIS — D631 Anemia in chronic kidney disease: Secondary | ICD-10-CM | POA: Diagnosis not present

## 2016-11-02 DIAGNOSIS — N2581 Secondary hyperparathyroidism of renal origin: Secondary | ICD-10-CM | POA: Diagnosis not present

## 2016-11-02 DIAGNOSIS — D631 Anemia in chronic kidney disease: Secondary | ICD-10-CM | POA: Diagnosis not present

## 2016-11-02 DIAGNOSIS — D509 Iron deficiency anemia, unspecified: Secondary | ICD-10-CM | POA: Diagnosis not present

## 2016-11-02 DIAGNOSIS — N186 End stage renal disease: Secondary | ICD-10-CM | POA: Diagnosis not present

## 2016-11-03 ENCOUNTER — Other Ambulatory Visit: Payer: Self-pay

## 2016-11-04 DIAGNOSIS — N186 End stage renal disease: Secondary | ICD-10-CM | POA: Diagnosis not present

## 2016-11-04 DIAGNOSIS — D631 Anemia in chronic kidney disease: Secondary | ICD-10-CM | POA: Diagnosis not present

## 2016-11-04 DIAGNOSIS — N2581 Secondary hyperparathyroidism of renal origin: Secondary | ICD-10-CM | POA: Diagnosis not present

## 2016-11-04 DIAGNOSIS — D509 Iron deficiency anemia, unspecified: Secondary | ICD-10-CM | POA: Diagnosis not present

## 2016-11-06 DIAGNOSIS — N2581 Secondary hyperparathyroidism of renal origin: Secondary | ICD-10-CM | POA: Diagnosis not present

## 2016-11-06 DIAGNOSIS — N186 End stage renal disease: Secondary | ICD-10-CM | POA: Diagnosis not present

## 2016-11-06 DIAGNOSIS — D631 Anemia in chronic kidney disease: Secondary | ICD-10-CM | POA: Diagnosis not present

## 2016-11-06 DIAGNOSIS — D509 Iron deficiency anemia, unspecified: Secondary | ICD-10-CM | POA: Diagnosis not present

## 2016-11-09 DIAGNOSIS — D631 Anemia in chronic kidney disease: Secondary | ICD-10-CM | POA: Diagnosis not present

## 2016-11-09 DIAGNOSIS — N186 End stage renal disease: Secondary | ICD-10-CM | POA: Diagnosis not present

## 2016-11-09 DIAGNOSIS — N2581 Secondary hyperparathyroidism of renal origin: Secondary | ICD-10-CM | POA: Diagnosis not present

## 2016-11-09 DIAGNOSIS — D509 Iron deficiency anemia, unspecified: Secondary | ICD-10-CM | POA: Diagnosis not present

## 2016-11-10 ENCOUNTER — Ambulatory Visit (HOSPITAL_COMMUNITY)
Admission: RE | Admit: 2016-11-10 | Discharge: 2016-11-10 | Disposition: A | Discharge status: Home / Self Care | Payer: Medicare Other | Source: Ambulatory Visit | Attending: Nephrology | Admitting: Nephrology

## 2016-11-10 DIAGNOSIS — N186 End stage renal disease: Secondary | ICD-10-CM | POA: Diagnosis not present

## 2016-11-10 LAB — ABO/RH: ABO/RH(D): O POS

## 2016-11-10 LAB — PREPARE RBC (CROSSMATCH)

## 2016-11-10 MED ORDER — SODIUM CHLORIDE 0.9 % IV SOLN
Freq: Once | INTRAVENOUS | Status: AC
  Administered 2016-11-10: 12:00:00 via INTRAVENOUS

## 2016-11-10 NOTE — Discharge Instructions (Signed)

## 2016-11-10 NOTE — Progress Notes (Signed)
Provider: Mauricia Area MD  Diagnosis: ESRD  Treatment: Patient received 2 units of PRBC's via IVPB. Patient tolerated procedure well with no transfusion reaction. Discharge instructions given to patient and patient states an understanding. Patient alert, oriented and ambulatory at time of discharge.

## 2016-11-11 DIAGNOSIS — D509 Iron deficiency anemia, unspecified: Secondary | ICD-10-CM | POA: Diagnosis not present

## 2016-11-11 DIAGNOSIS — N186 End stage renal disease: Secondary | ICD-10-CM | POA: Diagnosis not present

## 2016-11-11 DIAGNOSIS — D631 Anemia in chronic kidney disease: Secondary | ICD-10-CM | POA: Diagnosis not present

## 2016-11-11 DIAGNOSIS — N2581 Secondary hyperparathyroidism of renal origin: Secondary | ICD-10-CM | POA: Diagnosis not present

## 2016-11-11 LAB — BPAM RBC
Blood Product Expiration Date: 201804112359
Blood Product Expiration Date: 201804112359
ISSUE DATE / TIME: 201803201128
ISSUE DATE / TIME: 201803201128
Unit Type and Rh: 5100
Unit Type and Rh: 5100

## 2016-11-11 LAB — TYPE AND SCREEN
ABO/RH(D): O POS
Antibody Screen: NEGATIVE
Unit division: 0
Unit division: 0

## 2016-11-13 DIAGNOSIS — N186 End stage renal disease: Secondary | ICD-10-CM | POA: Diagnosis not present

## 2016-11-13 DIAGNOSIS — N2581 Secondary hyperparathyroidism of renal origin: Secondary | ICD-10-CM | POA: Diagnosis not present

## 2016-11-13 DIAGNOSIS — D509 Iron deficiency anemia, unspecified: Secondary | ICD-10-CM | POA: Diagnosis not present

## 2016-11-13 DIAGNOSIS — D631 Anemia in chronic kidney disease: Secondary | ICD-10-CM | POA: Diagnosis not present

## 2016-11-16 DIAGNOSIS — N2581 Secondary hyperparathyroidism of renal origin: Secondary | ICD-10-CM | POA: Diagnosis not present

## 2016-11-16 DIAGNOSIS — N186 End stage renal disease: Secondary | ICD-10-CM | POA: Diagnosis not present

## 2016-11-16 DIAGNOSIS — D631 Anemia in chronic kidney disease: Secondary | ICD-10-CM | POA: Diagnosis not present

## 2016-11-16 DIAGNOSIS — D509 Iron deficiency anemia, unspecified: Secondary | ICD-10-CM | POA: Diagnosis not present

## 2016-11-17 ENCOUNTER — Ambulatory Visit
Admission: RE | Admit: 2016-11-17 | Discharge: 2016-11-17 | Disposition: A | Discharge status: Home / Self Care | Payer: Medicare Other | Source: Ambulatory Visit | Attending: Diagnostic Neuroimaging | Admitting: Diagnostic Neuroimaging

## 2016-11-17 DIAGNOSIS — R519 Headache, unspecified: Secondary | ICD-10-CM

## 2016-11-17 DIAGNOSIS — R51 Headache: Secondary | ICD-10-CM | POA: Diagnosis not present

## 2016-11-17 DIAGNOSIS — T8090XS Unspecified complication following infusion and therapeutic injection, sequela: Secondary | ICD-10-CM

## 2016-11-18 DIAGNOSIS — N2581 Secondary hyperparathyroidism of renal origin: Secondary | ICD-10-CM | POA: Diagnosis not present

## 2016-11-18 DIAGNOSIS — N186 End stage renal disease: Secondary | ICD-10-CM | POA: Diagnosis not present

## 2016-11-18 DIAGNOSIS — D509 Iron deficiency anemia, unspecified: Secondary | ICD-10-CM | POA: Diagnosis not present

## 2016-11-18 DIAGNOSIS — D631 Anemia in chronic kidney disease: Secondary | ICD-10-CM | POA: Diagnosis not present

## 2016-11-19 ENCOUNTER — Telehealth: Payer: Self-pay | Admitting: *Deleted

## 2016-11-19 NOTE — Telephone Encounter (Signed)
Per Dr Leta Baptist, spoke with patient and informed her that her MRI brain results are unremarkable. Reviewed Dr Gladstone Lighter plan per office note. Advised she monitor her symptoms and call prior to FU in June if she has any questions, problems. Patient verbalized understanding, had no questions.

## 2016-11-20 DIAGNOSIS — N186 End stage renal disease: Secondary | ICD-10-CM | POA: Diagnosis not present

## 2016-11-20 DIAGNOSIS — D631 Anemia in chronic kidney disease: Secondary | ICD-10-CM | POA: Diagnosis not present

## 2016-11-20 DIAGNOSIS — N2581 Secondary hyperparathyroidism of renal origin: Secondary | ICD-10-CM | POA: Diagnosis not present

## 2016-11-20 DIAGNOSIS — D509 Iron deficiency anemia, unspecified: Secondary | ICD-10-CM | POA: Diagnosis not present

## 2016-11-21 DIAGNOSIS — I129 Hypertensive chronic kidney disease with stage 1 through stage 4 chronic kidney disease, or unspecified chronic kidney disease: Secondary | ICD-10-CM | POA: Diagnosis not present

## 2016-11-21 DIAGNOSIS — Z992 Dependence on renal dialysis: Secondary | ICD-10-CM | POA: Diagnosis not present

## 2016-11-21 DIAGNOSIS — N186 End stage renal disease: Secondary | ICD-10-CM | POA: Diagnosis not present

## 2016-11-23 DIAGNOSIS — N186 End stage renal disease: Secondary | ICD-10-CM | POA: Diagnosis not present

## 2016-11-23 DIAGNOSIS — N2581 Secondary hyperparathyroidism of renal origin: Secondary | ICD-10-CM | POA: Diagnosis not present

## 2016-11-23 DIAGNOSIS — D509 Iron deficiency anemia, unspecified: Secondary | ICD-10-CM | POA: Diagnosis not present

## 2016-11-23 DIAGNOSIS — D631 Anemia in chronic kidney disease: Secondary | ICD-10-CM | POA: Diagnosis not present

## 2016-11-25 DIAGNOSIS — D631 Anemia in chronic kidney disease: Secondary | ICD-10-CM | POA: Diagnosis not present

## 2016-11-25 DIAGNOSIS — D509 Iron deficiency anemia, unspecified: Secondary | ICD-10-CM | POA: Diagnosis not present

## 2016-11-25 DIAGNOSIS — N186 End stage renal disease: Secondary | ICD-10-CM | POA: Diagnosis not present

## 2016-11-25 DIAGNOSIS — N2581 Secondary hyperparathyroidism of renal origin: Secondary | ICD-10-CM | POA: Diagnosis not present

## 2016-11-27 DIAGNOSIS — N186 End stage renal disease: Secondary | ICD-10-CM | POA: Diagnosis not present

## 2016-11-27 DIAGNOSIS — N2581 Secondary hyperparathyroidism of renal origin: Secondary | ICD-10-CM | POA: Diagnosis not present

## 2016-11-27 DIAGNOSIS — D509 Iron deficiency anemia, unspecified: Secondary | ICD-10-CM | POA: Diagnosis not present

## 2016-11-27 DIAGNOSIS — D631 Anemia in chronic kidney disease: Secondary | ICD-10-CM | POA: Diagnosis not present

## 2016-11-30 DIAGNOSIS — D509 Iron deficiency anemia, unspecified: Secondary | ICD-10-CM | POA: Diagnosis not present

## 2016-11-30 DIAGNOSIS — D631 Anemia in chronic kidney disease: Secondary | ICD-10-CM | POA: Diagnosis not present

## 2016-11-30 DIAGNOSIS — N186 End stage renal disease: Secondary | ICD-10-CM | POA: Diagnosis not present

## 2016-11-30 DIAGNOSIS — N2581 Secondary hyperparathyroidism of renal origin: Secondary | ICD-10-CM | POA: Diagnosis not present

## 2016-12-02 DIAGNOSIS — D509 Iron deficiency anemia, unspecified: Secondary | ICD-10-CM | POA: Diagnosis not present

## 2016-12-02 DIAGNOSIS — N186 End stage renal disease: Secondary | ICD-10-CM | POA: Diagnosis not present

## 2016-12-02 DIAGNOSIS — N2581 Secondary hyperparathyroidism of renal origin: Secondary | ICD-10-CM | POA: Diagnosis not present

## 2016-12-02 DIAGNOSIS — D631 Anemia in chronic kidney disease: Secondary | ICD-10-CM | POA: Diagnosis not present

## 2016-12-04 DIAGNOSIS — N2581 Secondary hyperparathyroidism of renal origin: Secondary | ICD-10-CM | POA: Diagnosis not present

## 2016-12-04 DIAGNOSIS — N186 End stage renal disease: Secondary | ICD-10-CM | POA: Diagnosis not present

## 2016-12-04 DIAGNOSIS — D631 Anemia in chronic kidney disease: Secondary | ICD-10-CM | POA: Diagnosis not present

## 2016-12-04 DIAGNOSIS — D509 Iron deficiency anemia, unspecified: Secondary | ICD-10-CM | POA: Diagnosis not present

## 2016-12-07 DIAGNOSIS — N2581 Secondary hyperparathyroidism of renal origin: Secondary | ICD-10-CM | POA: Diagnosis not present

## 2016-12-07 DIAGNOSIS — N186 End stage renal disease: Secondary | ICD-10-CM | POA: Diagnosis not present

## 2016-12-07 DIAGNOSIS — D631 Anemia in chronic kidney disease: Secondary | ICD-10-CM | POA: Diagnosis not present

## 2016-12-07 DIAGNOSIS — D509 Iron deficiency anemia, unspecified: Secondary | ICD-10-CM | POA: Diagnosis not present

## 2016-12-09 DIAGNOSIS — N186 End stage renal disease: Secondary | ICD-10-CM | POA: Diagnosis not present

## 2016-12-09 DIAGNOSIS — N2581 Secondary hyperparathyroidism of renal origin: Secondary | ICD-10-CM | POA: Diagnosis not present

## 2016-12-09 DIAGNOSIS — D631 Anemia in chronic kidney disease: Secondary | ICD-10-CM | POA: Diagnosis not present

## 2016-12-09 DIAGNOSIS — D509 Iron deficiency anemia, unspecified: Secondary | ICD-10-CM | POA: Diagnosis not present

## 2016-12-11 DIAGNOSIS — N2581 Secondary hyperparathyroidism of renal origin: Secondary | ICD-10-CM | POA: Diagnosis not present

## 2016-12-11 DIAGNOSIS — N186 End stage renal disease: Secondary | ICD-10-CM | POA: Diagnosis not present

## 2016-12-11 DIAGNOSIS — D631 Anemia in chronic kidney disease: Secondary | ICD-10-CM | POA: Diagnosis not present

## 2016-12-11 DIAGNOSIS — D509 Iron deficiency anemia, unspecified: Secondary | ICD-10-CM | POA: Diagnosis not present

## 2016-12-14 DIAGNOSIS — D509 Iron deficiency anemia, unspecified: Secondary | ICD-10-CM | POA: Diagnosis not present

## 2016-12-14 DIAGNOSIS — D631 Anemia in chronic kidney disease: Secondary | ICD-10-CM | POA: Diagnosis not present

## 2016-12-14 DIAGNOSIS — N2581 Secondary hyperparathyroidism of renal origin: Secondary | ICD-10-CM | POA: Diagnosis not present

## 2016-12-14 DIAGNOSIS — N186 End stage renal disease: Secondary | ICD-10-CM | POA: Diagnosis not present

## 2016-12-16 DIAGNOSIS — D631 Anemia in chronic kidney disease: Secondary | ICD-10-CM | POA: Diagnosis not present

## 2016-12-16 DIAGNOSIS — N2581 Secondary hyperparathyroidism of renal origin: Secondary | ICD-10-CM | POA: Diagnosis not present

## 2016-12-16 DIAGNOSIS — D509 Iron deficiency anemia, unspecified: Secondary | ICD-10-CM | POA: Diagnosis not present

## 2016-12-16 DIAGNOSIS — N186 End stage renal disease: Secondary | ICD-10-CM | POA: Diagnosis not present

## 2016-12-18 DIAGNOSIS — N186 End stage renal disease: Secondary | ICD-10-CM | POA: Diagnosis not present

## 2016-12-18 DIAGNOSIS — D509 Iron deficiency anemia, unspecified: Secondary | ICD-10-CM | POA: Diagnosis not present

## 2016-12-18 DIAGNOSIS — D631 Anemia in chronic kidney disease: Secondary | ICD-10-CM | POA: Diagnosis not present

## 2016-12-18 DIAGNOSIS — N2581 Secondary hyperparathyroidism of renal origin: Secondary | ICD-10-CM | POA: Diagnosis not present

## 2016-12-21 DIAGNOSIS — I129 Hypertensive chronic kidney disease with stage 1 through stage 4 chronic kidney disease, or unspecified chronic kidney disease: Secondary | ICD-10-CM | POA: Diagnosis not present

## 2016-12-21 DIAGNOSIS — Z992 Dependence on renal dialysis: Secondary | ICD-10-CM | POA: Diagnosis not present

## 2016-12-21 DIAGNOSIS — N2581 Secondary hyperparathyroidism of renal origin: Secondary | ICD-10-CM | POA: Diagnosis not present

## 2016-12-21 DIAGNOSIS — D509 Iron deficiency anemia, unspecified: Secondary | ICD-10-CM | POA: Diagnosis not present

## 2016-12-21 DIAGNOSIS — N186 End stage renal disease: Secondary | ICD-10-CM | POA: Diagnosis not present

## 2016-12-21 DIAGNOSIS — D631 Anemia in chronic kidney disease: Secondary | ICD-10-CM | POA: Diagnosis not present

## 2016-12-23 DIAGNOSIS — N2581 Secondary hyperparathyroidism of renal origin: Secondary | ICD-10-CM | POA: Diagnosis not present

## 2016-12-23 DIAGNOSIS — D509 Iron deficiency anemia, unspecified: Secondary | ICD-10-CM | POA: Diagnosis not present

## 2016-12-23 DIAGNOSIS — N186 End stage renal disease: Secondary | ICD-10-CM | POA: Diagnosis not present

## 2016-12-23 DIAGNOSIS — D631 Anemia in chronic kidney disease: Secondary | ICD-10-CM | POA: Diagnosis not present

## 2016-12-25 DIAGNOSIS — D509 Iron deficiency anemia, unspecified: Secondary | ICD-10-CM | POA: Diagnosis not present

## 2016-12-25 DIAGNOSIS — D631 Anemia in chronic kidney disease: Secondary | ICD-10-CM | POA: Diagnosis not present

## 2016-12-25 DIAGNOSIS — N2581 Secondary hyperparathyroidism of renal origin: Secondary | ICD-10-CM | POA: Diagnosis not present

## 2016-12-25 DIAGNOSIS — N186 End stage renal disease: Secondary | ICD-10-CM | POA: Diagnosis not present

## 2016-12-28 DIAGNOSIS — D631 Anemia in chronic kidney disease: Secondary | ICD-10-CM | POA: Diagnosis not present

## 2016-12-28 DIAGNOSIS — N2581 Secondary hyperparathyroidism of renal origin: Secondary | ICD-10-CM | POA: Diagnosis not present

## 2016-12-28 DIAGNOSIS — N186 End stage renal disease: Secondary | ICD-10-CM | POA: Diagnosis not present

## 2016-12-28 DIAGNOSIS — D509 Iron deficiency anemia, unspecified: Secondary | ICD-10-CM | POA: Diagnosis not present

## 2016-12-30 DIAGNOSIS — D631 Anemia in chronic kidney disease: Secondary | ICD-10-CM | POA: Diagnosis not present

## 2016-12-30 DIAGNOSIS — N186 End stage renal disease: Secondary | ICD-10-CM | POA: Diagnosis not present

## 2016-12-30 DIAGNOSIS — D509 Iron deficiency anemia, unspecified: Secondary | ICD-10-CM | POA: Diagnosis not present

## 2016-12-30 DIAGNOSIS — N2581 Secondary hyperparathyroidism of renal origin: Secondary | ICD-10-CM | POA: Diagnosis not present

## 2017-01-01 DIAGNOSIS — D509 Iron deficiency anemia, unspecified: Secondary | ICD-10-CM | POA: Diagnosis not present

## 2017-01-01 DIAGNOSIS — N2581 Secondary hyperparathyroidism of renal origin: Secondary | ICD-10-CM | POA: Diagnosis not present

## 2017-01-01 DIAGNOSIS — D631 Anemia in chronic kidney disease: Secondary | ICD-10-CM | POA: Diagnosis not present

## 2017-01-01 DIAGNOSIS — N186 End stage renal disease: Secondary | ICD-10-CM | POA: Diagnosis not present

## 2017-01-04 DIAGNOSIS — D631 Anemia in chronic kidney disease: Secondary | ICD-10-CM | POA: Diagnosis not present

## 2017-01-04 DIAGNOSIS — N186 End stage renal disease: Secondary | ICD-10-CM | POA: Diagnosis not present

## 2017-01-04 DIAGNOSIS — D509 Iron deficiency anemia, unspecified: Secondary | ICD-10-CM | POA: Diagnosis not present

## 2017-01-04 DIAGNOSIS — N2581 Secondary hyperparathyroidism of renal origin: Secondary | ICD-10-CM | POA: Diagnosis not present

## 2017-01-06 DIAGNOSIS — D509 Iron deficiency anemia, unspecified: Secondary | ICD-10-CM | POA: Diagnosis not present

## 2017-01-06 DIAGNOSIS — N186 End stage renal disease: Secondary | ICD-10-CM | POA: Diagnosis not present

## 2017-01-06 DIAGNOSIS — D631 Anemia in chronic kidney disease: Secondary | ICD-10-CM | POA: Diagnosis not present

## 2017-01-06 DIAGNOSIS — N2581 Secondary hyperparathyroidism of renal origin: Secondary | ICD-10-CM | POA: Diagnosis not present

## 2017-01-08 DIAGNOSIS — D509 Iron deficiency anemia, unspecified: Secondary | ICD-10-CM | POA: Diagnosis not present

## 2017-01-08 DIAGNOSIS — N2581 Secondary hyperparathyroidism of renal origin: Secondary | ICD-10-CM | POA: Diagnosis not present

## 2017-01-08 DIAGNOSIS — N186 End stage renal disease: Secondary | ICD-10-CM | POA: Diagnosis not present

## 2017-01-08 DIAGNOSIS — D631 Anemia in chronic kidney disease: Secondary | ICD-10-CM | POA: Diagnosis not present

## 2017-01-11 DIAGNOSIS — N2581 Secondary hyperparathyroidism of renal origin: Secondary | ICD-10-CM | POA: Diagnosis not present

## 2017-01-11 DIAGNOSIS — D509 Iron deficiency anemia, unspecified: Secondary | ICD-10-CM | POA: Diagnosis not present

## 2017-01-11 DIAGNOSIS — D631 Anemia in chronic kidney disease: Secondary | ICD-10-CM | POA: Diagnosis not present

## 2017-01-11 DIAGNOSIS — N186 End stage renal disease: Secondary | ICD-10-CM | POA: Diagnosis not present

## 2017-01-13 DIAGNOSIS — N186 End stage renal disease: Secondary | ICD-10-CM | POA: Diagnosis not present

## 2017-01-13 DIAGNOSIS — D509 Iron deficiency anemia, unspecified: Secondary | ICD-10-CM | POA: Diagnosis not present

## 2017-01-13 DIAGNOSIS — N2581 Secondary hyperparathyroidism of renal origin: Secondary | ICD-10-CM | POA: Diagnosis not present

## 2017-01-13 DIAGNOSIS — D631 Anemia in chronic kidney disease: Secondary | ICD-10-CM | POA: Diagnosis not present

## 2017-01-14 ENCOUNTER — Other Ambulatory Visit: Payer: Self-pay | Admitting: Family Medicine

## 2017-01-14 MED ORDER — FLUCONAZOLE 150 MG PO TABS: 150.0000 mg | ORAL_TABLET | Freq: Once | ORAL | 0 refills | Status: AC

## 2017-01-14 NOTE — Progress Notes (Signed)
Patient at her daughter's appt. States she used a different kind of bath soap, now has vulvar pruritus and vaginal discharge consistent with previous yeast infections. Asking for a Rx for Diflucan. No adverse reactions to this medication in the past.  Archie Patten, MD [facility] Family Medicine Resident  01/14/2017, 5:00 PM

## 2017-01-15 DIAGNOSIS — D631 Anemia in chronic kidney disease: Secondary | ICD-10-CM | POA: Diagnosis not present

## 2017-01-15 DIAGNOSIS — N186 End stage renal disease: Secondary | ICD-10-CM | POA: Diagnosis not present

## 2017-01-15 DIAGNOSIS — N2581 Secondary hyperparathyroidism of renal origin: Secondary | ICD-10-CM | POA: Diagnosis not present

## 2017-01-15 DIAGNOSIS — D509 Iron deficiency anemia, unspecified: Secondary | ICD-10-CM | POA: Diagnosis not present

## 2017-01-18 DIAGNOSIS — N2581 Secondary hyperparathyroidism of renal origin: Secondary | ICD-10-CM | POA: Diagnosis not present

## 2017-01-18 DIAGNOSIS — D509 Iron deficiency anemia, unspecified: Secondary | ICD-10-CM | POA: Diagnosis not present

## 2017-01-18 DIAGNOSIS — N186 End stage renal disease: Secondary | ICD-10-CM | POA: Diagnosis not present

## 2017-01-18 DIAGNOSIS — D631 Anemia in chronic kidney disease: Secondary | ICD-10-CM | POA: Diagnosis not present

## 2017-01-20 DIAGNOSIS — D509 Iron deficiency anemia, unspecified: Secondary | ICD-10-CM | POA: Diagnosis not present

## 2017-01-20 DIAGNOSIS — N186 End stage renal disease: Secondary | ICD-10-CM | POA: Diagnosis not present

## 2017-01-20 DIAGNOSIS — N2581 Secondary hyperparathyroidism of renal origin: Secondary | ICD-10-CM | POA: Diagnosis not present

## 2017-01-20 DIAGNOSIS — D631 Anemia in chronic kidney disease: Secondary | ICD-10-CM | POA: Diagnosis not present

## 2017-01-21 ENCOUNTER — Other Ambulatory Visit: Payer: Self-pay | Admitting: Family Medicine

## 2017-01-21 DIAGNOSIS — I129 Hypertensive chronic kidney disease with stage 1 through stage 4 chronic kidney disease, or unspecified chronic kidney disease: Secondary | ICD-10-CM | POA: Diagnosis not present

## 2017-01-21 DIAGNOSIS — N186 End stage renal disease: Secondary | ICD-10-CM | POA: Diagnosis not present

## 2017-01-21 DIAGNOSIS — Z1231 Encounter for screening mammogram for malignant neoplasm of breast: Secondary | ICD-10-CM

## 2017-01-21 DIAGNOSIS — Z992 Dependence on renal dialysis: Secondary | ICD-10-CM | POA: Diagnosis not present

## 2017-01-22 DIAGNOSIS — N2581 Secondary hyperparathyroidism of renal origin: Secondary | ICD-10-CM | POA: Diagnosis not present

## 2017-01-22 DIAGNOSIS — D509 Iron deficiency anemia, unspecified: Secondary | ICD-10-CM | POA: Diagnosis not present

## 2017-01-22 DIAGNOSIS — N186 End stage renal disease: Secondary | ICD-10-CM | POA: Diagnosis not present

## 2017-01-25 DIAGNOSIS — N2581 Secondary hyperparathyroidism of renal origin: Secondary | ICD-10-CM | POA: Diagnosis not present

## 2017-01-25 DIAGNOSIS — N186 End stage renal disease: Secondary | ICD-10-CM | POA: Diagnosis not present

## 2017-01-25 DIAGNOSIS — D509 Iron deficiency anemia, unspecified: Secondary | ICD-10-CM | POA: Diagnosis not present

## 2017-01-26 ENCOUNTER — Ambulatory Visit: Payer: Self-pay | Admitting: Family Medicine

## 2017-01-27 DIAGNOSIS — N2581 Secondary hyperparathyroidism of renal origin: Secondary | ICD-10-CM | POA: Diagnosis not present

## 2017-01-27 DIAGNOSIS — N186 End stage renal disease: Secondary | ICD-10-CM | POA: Diagnosis not present

## 2017-01-27 DIAGNOSIS — D509 Iron deficiency anemia, unspecified: Secondary | ICD-10-CM | POA: Diagnosis not present

## 2017-01-29 DIAGNOSIS — D509 Iron deficiency anemia, unspecified: Secondary | ICD-10-CM | POA: Diagnosis not present

## 2017-01-29 DIAGNOSIS — N2581 Secondary hyperparathyroidism of renal origin: Secondary | ICD-10-CM | POA: Diagnosis not present

## 2017-01-29 DIAGNOSIS — N186 End stage renal disease: Secondary | ICD-10-CM | POA: Diagnosis not present

## 2017-02-01 DIAGNOSIS — D509 Iron deficiency anemia, unspecified: Secondary | ICD-10-CM | POA: Diagnosis not present

## 2017-02-01 DIAGNOSIS — N186 End stage renal disease: Secondary | ICD-10-CM | POA: Diagnosis not present

## 2017-02-01 DIAGNOSIS — N2581 Secondary hyperparathyroidism of renal origin: Secondary | ICD-10-CM | POA: Diagnosis not present

## 2017-02-03 DIAGNOSIS — D509 Iron deficiency anemia, unspecified: Secondary | ICD-10-CM | POA: Diagnosis not present

## 2017-02-03 DIAGNOSIS — N2581 Secondary hyperparathyroidism of renal origin: Secondary | ICD-10-CM | POA: Diagnosis not present

## 2017-02-03 DIAGNOSIS — N186 End stage renal disease: Secondary | ICD-10-CM | POA: Diagnosis not present

## 2017-02-04 ENCOUNTER — Ambulatory Visit
Admission: RE | Admit: 2017-02-04 | Discharge: 2017-02-04 | Disposition: A | Discharge status: Home / Self Care | Payer: Medicare Other | Source: Ambulatory Visit | Attending: Family Medicine | Admitting: Family Medicine

## 2017-02-04 DIAGNOSIS — Z1231 Encounter for screening mammogram for malignant neoplasm of breast: Secondary | ICD-10-CM

## 2017-02-05 DIAGNOSIS — N2581 Secondary hyperparathyroidism of renal origin: Secondary | ICD-10-CM | POA: Diagnosis not present

## 2017-02-05 DIAGNOSIS — N186 End stage renal disease: Secondary | ICD-10-CM | POA: Diagnosis not present

## 2017-02-05 DIAGNOSIS — D509 Iron deficiency anemia, unspecified: Secondary | ICD-10-CM | POA: Diagnosis not present

## 2017-02-08 DIAGNOSIS — D509 Iron deficiency anemia, unspecified: Secondary | ICD-10-CM | POA: Diagnosis not present

## 2017-02-08 DIAGNOSIS — N186 End stage renal disease: Secondary | ICD-10-CM | POA: Diagnosis not present

## 2017-02-08 DIAGNOSIS — N2581 Secondary hyperparathyroidism of renal origin: Secondary | ICD-10-CM | POA: Diagnosis not present

## 2017-02-10 DIAGNOSIS — N186 End stage renal disease: Secondary | ICD-10-CM | POA: Diagnosis not present

## 2017-02-10 DIAGNOSIS — N2581 Secondary hyperparathyroidism of renal origin: Secondary | ICD-10-CM | POA: Diagnosis not present

## 2017-02-10 DIAGNOSIS — D509 Iron deficiency anemia, unspecified: Secondary | ICD-10-CM | POA: Diagnosis not present

## 2017-02-12 DIAGNOSIS — N2581 Secondary hyperparathyroidism of renal origin: Secondary | ICD-10-CM | POA: Diagnosis not present

## 2017-02-12 DIAGNOSIS — D509 Iron deficiency anemia, unspecified: Secondary | ICD-10-CM | POA: Diagnosis not present

## 2017-02-12 DIAGNOSIS — N186 End stage renal disease: Secondary | ICD-10-CM | POA: Diagnosis not present

## 2017-02-15 DIAGNOSIS — N186 End stage renal disease: Secondary | ICD-10-CM | POA: Diagnosis not present

## 2017-02-15 DIAGNOSIS — D509 Iron deficiency anemia, unspecified: Secondary | ICD-10-CM | POA: Diagnosis not present

## 2017-02-15 DIAGNOSIS — N2581 Secondary hyperparathyroidism of renal origin: Secondary | ICD-10-CM | POA: Diagnosis not present

## 2017-02-16 ENCOUNTER — Ambulatory Visit: Payer: Self-pay | Admitting: Diagnostic Neuroimaging

## 2017-02-17 ENCOUNTER — Ambulatory Visit: Payer: Medicare Other | Admitting: Family Medicine

## 2017-02-17 DIAGNOSIS — N2581 Secondary hyperparathyroidism of renal origin: Secondary | ICD-10-CM | POA: Diagnosis not present

## 2017-02-17 DIAGNOSIS — D509 Iron deficiency anemia, unspecified: Secondary | ICD-10-CM | POA: Diagnosis not present

## 2017-02-17 DIAGNOSIS — N186 End stage renal disease: Secondary | ICD-10-CM | POA: Diagnosis not present

## 2017-02-19 DIAGNOSIS — N2581 Secondary hyperparathyroidism of renal origin: Secondary | ICD-10-CM | POA: Diagnosis not present

## 2017-02-19 DIAGNOSIS — N186 End stage renal disease: Secondary | ICD-10-CM | POA: Diagnosis not present

## 2017-02-19 DIAGNOSIS — D509 Iron deficiency anemia, unspecified: Secondary | ICD-10-CM | POA: Diagnosis not present

## 2017-02-20 DIAGNOSIS — Z992 Dependence on renal dialysis: Secondary | ICD-10-CM | POA: Diagnosis not present

## 2017-02-20 DIAGNOSIS — I129 Hypertensive chronic kidney disease with stage 1 through stage 4 chronic kidney disease, or unspecified chronic kidney disease: Secondary | ICD-10-CM | POA: Diagnosis not present

## 2017-02-20 DIAGNOSIS — N186 End stage renal disease: Secondary | ICD-10-CM | POA: Diagnosis not present

## 2017-02-22 DIAGNOSIS — N2581 Secondary hyperparathyroidism of renal origin: Secondary | ICD-10-CM | POA: Diagnosis not present

## 2017-02-22 DIAGNOSIS — N186 End stage renal disease: Secondary | ICD-10-CM | POA: Diagnosis not present

## 2017-02-22 DIAGNOSIS — D509 Iron deficiency anemia, unspecified: Secondary | ICD-10-CM | POA: Diagnosis not present

## 2017-02-22 DIAGNOSIS — D631 Anemia in chronic kidney disease: Secondary | ICD-10-CM | POA: Diagnosis not present

## 2017-02-24 DIAGNOSIS — N186 End stage renal disease: Secondary | ICD-10-CM | POA: Diagnosis not present

## 2017-02-24 DIAGNOSIS — D509 Iron deficiency anemia, unspecified: Secondary | ICD-10-CM | POA: Diagnosis not present

## 2017-02-24 DIAGNOSIS — N2581 Secondary hyperparathyroidism of renal origin: Secondary | ICD-10-CM | POA: Diagnosis not present

## 2017-02-24 DIAGNOSIS — D631 Anemia in chronic kidney disease: Secondary | ICD-10-CM | POA: Diagnosis not present

## 2017-02-25 ENCOUNTER — Encounter: Payer: Self-pay | Admitting: Internal Medicine

## 2017-02-25 ENCOUNTER — Other Ambulatory Visit (HOSPITAL_COMMUNITY)
Admission: RE | Admit: 2017-02-25 | Discharge: 2017-02-25 | Disposition: A | Discharge status: Home / Self Care | Payer: Medicare Other | Source: Ambulatory Visit | Attending: Family Medicine | Admitting: Family Medicine

## 2017-02-25 ENCOUNTER — Ambulatory Visit (INDEPENDENT_AMBULATORY_CARE_PROVIDER_SITE_OTHER): Payer: Medicare Other | Admitting: Internal Medicine

## 2017-02-25 VITALS — BP 124/80 | HR 68 | Temp 98.5°F | Ht 67.0 in | Wt 130.4 lb

## 2017-02-25 DIAGNOSIS — Z01419 Encounter for gynecological examination (general) (routine) without abnormal findings: Secondary | ICD-10-CM | POA: Insufficient documentation

## 2017-02-25 DIAGNOSIS — Z114 Encounter for screening for human immunodeficiency virus [HIV]: Secondary | ICD-10-CM | POA: Diagnosis not present

## 2017-02-25 DIAGNOSIS — Z124 Encounter for screening for malignant neoplasm of cervix: Secondary | ICD-10-CM | POA: Diagnosis not present

## 2017-02-25 DIAGNOSIS — N898 Other specified noninflammatory disorders of vagina: Secondary | ICD-10-CM

## 2017-02-25 DIAGNOSIS — Z113 Encounter for screening for infections with a predominantly sexual mode of transmission: Secondary | ICD-10-CM | POA: Diagnosis not present

## 2017-02-25 LAB — POCT WET PREP (WET MOUNT)
Clue Cells Wet Prep Whiff POC: NEGATIVE
Trichomonas Wet Prep HPF POC: ABSENT

## 2017-02-25 NOTE — Assessment & Plan Note (Signed)
Light vaginal discharge, no irritation, not sexually active in 2 and half years, does not want STD testing as had this recently. Burning sensation with the urgency to urinate however since restarting dialysis has not urinated for the past 4 months. Feels like this is UTI thought does not urinate  - Will check wet prep for yeast infection, if negative will trial patient on macrobid for 3 days, although unlikely UTI given lack of urination  - Follow up in 11months for blood pressure

## 2017-02-25 NOTE — Progress Notes (Signed)
   [facility] Kerrin Mo, MD Phone: [phone]  Reason For Visit: Pap smear f/u     # Women's Health  Periods: Has not had periods in awhile, for about four months, just had menstrual period - had 4 days, normal menstrual period. Previously has had irregular menstrual periods having been on dialysis previously. Patient indicates she has previously been on dialysis and stopped her menstrual period completely, then when she was donated kidney she regained her menstrual period. However since he is starting dialysis has stopped menstrual periods again  Contraception: Not using any contraception  Pelvic symptoms: small amount vaginal discharge (4 months), white discharge, no pelvic pain, no irritation or itching  Sexual activity: Has not been sexually active in two and half years STD Screening: Dr. 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  #Pain with urge to urinate States over the past 4 months ago patient has a urge and burning sensation to urinate, however has stopped making urine, has a history of UTIs and states that this is similar to those. Patient does endorse some light vaginal discharge. However denies his previous infection as she has no itching or irritation associated with it. Has not discussed with nephrology. Patient was treated about 4 months ago for a yeast infection   Past Medical History Reviewed problem list.  Medications- reviewed and updated No additions to family history Social history- patient is a  Non-smoker  Objective: BP 124/80   Pulse 68   Temp 98.5 F (36.9 C) (Oral)   Ht 5\' 7"  (1.702 m)   Wt 130 lb 6.4 oz (59.1 kg)   LMP  (LMP Unknown)   SpO2 98%   BMI 20.42 kg/m  Gen: NAD, alert, cooperative with exam Cardio:  regular rate and rhythm, S1S2 heard, no murmurs appreciated Pulm: clear to auscultation bilaterally, no wheezes, rhonchi or rales GI: soft, non-tender, non-distended, bowel sounds present, no hepatomegaly, no splenomegaly GU: external vaginal tissue wnl, cervix wnl, no punctate lesions on cervix appreciated, no discharge from cervical os, minimal white discharge in vaginal vault, no cervical motion tenderness, no abdominal/ adnexal masses Skin: dry, intact, no rashes or lesions   Assessment/Plan: See problem based a/p  Encounter for routine gynecological examination 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 Repeat Pap smear today with HPV    Vaginal discharge Light vaginal discharge, no irritation, not sexually active in 2 and half years, does not want STD testing as had this recently. Burning sensation with the urgency to urinate however since restarting dialysis has not urinated for the past 4 months. Feels like this is UTI thought does not urinate  - Will check wet prep for yeast infection, if negative will trial patient on macrobid for 3 days, although unlikely UTI given lack of urination  - Follow up in 51months for blood pressure

## 2017-02-25 NOTE — Assessment & Plan Note (Signed)
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 Repeat Pap smear today with HPV

## 2017-02-25 NOTE — Patient Instructions (Addendum)
I will contact you regard the results of wet prep, if that is negative for yeast. We can try the Macrobid for possibly UTI for 3 days to see if that helps. Follow up in 3 months for your blood pressure

## 2017-02-26 DIAGNOSIS — D631 Anemia in chronic kidney disease: Secondary | ICD-10-CM | POA: Diagnosis not present

## 2017-02-26 DIAGNOSIS — N186 End stage renal disease: Secondary | ICD-10-CM | POA: Diagnosis not present

## 2017-02-26 DIAGNOSIS — N2581 Secondary hyperparathyroidism of renal origin: Secondary | ICD-10-CM | POA: Diagnosis not present

## 2017-02-26 DIAGNOSIS — D509 Iron deficiency anemia, unspecified: Secondary | ICD-10-CM | POA: Diagnosis not present

## 2017-02-26 LAB — HIV ANTIBODY (ROUTINE TESTING W REFLEX): HIV Screen 4th Generation wRfx: NONREACTIVE

## 2017-02-26 LAB — RPR: RPR Ser Ql: NONREACTIVE

## 2017-03-01 DIAGNOSIS — D631 Anemia in chronic kidney disease: Secondary | ICD-10-CM | POA: Diagnosis not present

## 2017-03-01 DIAGNOSIS — D509 Iron deficiency anemia, unspecified: Secondary | ICD-10-CM | POA: Diagnosis not present

## 2017-03-01 DIAGNOSIS — N186 End stage renal disease: Secondary | ICD-10-CM | POA: Diagnosis not present

## 2017-03-01 DIAGNOSIS — N2581 Secondary hyperparathyroidism of renal origin: Secondary | ICD-10-CM | POA: Diagnosis not present

## 2017-03-01 LAB — CYTOLOGY - PAP
Diagnosis: NEGATIVE
HPV: NOT DETECTED

## 2017-03-02 ENCOUNTER — Encounter: Payer: Self-pay | Admitting: Internal Medicine

## 2017-03-03 DIAGNOSIS — N2581 Secondary hyperparathyroidism of renal origin: Secondary | ICD-10-CM | POA: Diagnosis not present

## 2017-03-03 DIAGNOSIS — N186 End stage renal disease: Secondary | ICD-10-CM | POA: Diagnosis not present

## 2017-03-03 DIAGNOSIS — D631 Anemia in chronic kidney disease: Secondary | ICD-10-CM | POA: Diagnosis not present

## 2017-03-03 DIAGNOSIS — D509 Iron deficiency anemia, unspecified: Secondary | ICD-10-CM | POA: Diagnosis not present

## 2017-03-05 DIAGNOSIS — N186 End stage renal disease: Secondary | ICD-10-CM | POA: Diagnosis not present

## 2017-03-05 DIAGNOSIS — D631 Anemia in chronic kidney disease: Secondary | ICD-10-CM | POA: Diagnosis not present

## 2017-03-05 DIAGNOSIS — D509 Iron deficiency anemia, unspecified: Secondary | ICD-10-CM | POA: Diagnosis not present

## 2017-03-05 DIAGNOSIS — N2581 Secondary hyperparathyroidism of renal origin: Secondary | ICD-10-CM | POA: Diagnosis not present

## 2017-03-08 DIAGNOSIS — D631 Anemia in chronic kidney disease: Secondary | ICD-10-CM | POA: Diagnosis not present

## 2017-03-08 DIAGNOSIS — N186 End stage renal disease: Secondary | ICD-10-CM | POA: Diagnosis not present

## 2017-03-08 DIAGNOSIS — D509 Iron deficiency anemia, unspecified: Secondary | ICD-10-CM | POA: Diagnosis not present

## 2017-03-08 DIAGNOSIS — N2581 Secondary hyperparathyroidism of renal origin: Secondary | ICD-10-CM | POA: Diagnosis not present

## 2017-03-10 DIAGNOSIS — N2581 Secondary hyperparathyroidism of renal origin: Secondary | ICD-10-CM | POA: Diagnosis not present

## 2017-03-10 DIAGNOSIS — N186 End stage renal disease: Secondary | ICD-10-CM | POA: Diagnosis not present

## 2017-03-10 DIAGNOSIS — D509 Iron deficiency anemia, unspecified: Secondary | ICD-10-CM | POA: Diagnosis not present

## 2017-03-10 DIAGNOSIS — D631 Anemia in chronic kidney disease: Secondary | ICD-10-CM | POA: Diagnosis not present

## 2017-03-12 DIAGNOSIS — D631 Anemia in chronic kidney disease: Secondary | ICD-10-CM | POA: Diagnosis not present

## 2017-03-12 DIAGNOSIS — D509 Iron deficiency anemia, unspecified: Secondary | ICD-10-CM | POA: Diagnosis not present

## 2017-03-12 DIAGNOSIS — N186 End stage renal disease: Secondary | ICD-10-CM | POA: Diagnosis not present

## 2017-03-12 DIAGNOSIS — N2581 Secondary hyperparathyroidism of renal origin: Secondary | ICD-10-CM | POA: Diagnosis not present

## 2017-03-15 DIAGNOSIS — D631 Anemia in chronic kidney disease: Secondary | ICD-10-CM | POA: Diagnosis not present

## 2017-03-15 DIAGNOSIS — N186 End stage renal disease: Secondary | ICD-10-CM | POA: Diagnosis not present

## 2017-03-15 DIAGNOSIS — N2581 Secondary hyperparathyroidism of renal origin: Secondary | ICD-10-CM | POA: Diagnosis not present

## 2017-03-15 DIAGNOSIS — D509 Iron deficiency anemia, unspecified: Secondary | ICD-10-CM | POA: Diagnosis not present

## 2017-03-17 DIAGNOSIS — N186 End stage renal disease: Secondary | ICD-10-CM | POA: Diagnosis not present

## 2017-03-17 DIAGNOSIS — D631 Anemia in chronic kidney disease: Secondary | ICD-10-CM | POA: Diagnosis not present

## 2017-03-17 DIAGNOSIS — N2581 Secondary hyperparathyroidism of renal origin: Secondary | ICD-10-CM | POA: Diagnosis not present

## 2017-03-17 DIAGNOSIS — D509 Iron deficiency anemia, unspecified: Secondary | ICD-10-CM | POA: Diagnosis not present

## 2017-03-19 DIAGNOSIS — N186 End stage renal disease: Secondary | ICD-10-CM | POA: Diagnosis not present

## 2017-03-19 DIAGNOSIS — N2581 Secondary hyperparathyroidism of renal origin: Secondary | ICD-10-CM | POA: Diagnosis not present

## 2017-03-19 DIAGNOSIS — D509 Iron deficiency anemia, unspecified: Secondary | ICD-10-CM | POA: Diagnosis not present

## 2017-03-19 DIAGNOSIS — D631 Anemia in chronic kidney disease: Secondary | ICD-10-CM | POA: Diagnosis not present

## 2017-03-22 DIAGNOSIS — D631 Anemia in chronic kidney disease: Secondary | ICD-10-CM | POA: Diagnosis not present

## 2017-03-22 DIAGNOSIS — D509 Iron deficiency anemia, unspecified: Secondary | ICD-10-CM | POA: Diagnosis not present

## 2017-03-22 DIAGNOSIS — N186 End stage renal disease: Secondary | ICD-10-CM | POA: Diagnosis not present

## 2017-03-22 DIAGNOSIS — N2581 Secondary hyperparathyroidism of renal origin: Secondary | ICD-10-CM | POA: Diagnosis not present

## 2017-03-23 DIAGNOSIS — N186 End stage renal disease: Secondary | ICD-10-CM | POA: Diagnosis not present

## 2017-03-23 DIAGNOSIS — Z992 Dependence on renal dialysis: Secondary | ICD-10-CM | POA: Diagnosis not present

## 2017-03-23 DIAGNOSIS — I129 Hypertensive chronic kidney disease with stage 1 through stage 4 chronic kidney disease, or unspecified chronic kidney disease: Secondary | ICD-10-CM | POA: Diagnosis not present

## 2017-03-24 DIAGNOSIS — D631 Anemia in chronic kidney disease: Secondary | ICD-10-CM | POA: Diagnosis not present

## 2017-03-24 DIAGNOSIS — N186 End stage renal disease: Secondary | ICD-10-CM | POA: Diagnosis not present

## 2017-03-24 DIAGNOSIS — D509 Iron deficiency anemia, unspecified: Secondary | ICD-10-CM | POA: Diagnosis not present

## 2017-03-24 DIAGNOSIS — N2581 Secondary hyperparathyroidism of renal origin: Secondary | ICD-10-CM | POA: Diagnosis not present

## 2017-03-26 DIAGNOSIS — N186 End stage renal disease: Secondary | ICD-10-CM | POA: Diagnosis not present

## 2017-03-26 DIAGNOSIS — D631 Anemia in chronic kidney disease: Secondary | ICD-10-CM | POA: Diagnosis not present

## 2017-03-26 DIAGNOSIS — N2581 Secondary hyperparathyroidism of renal origin: Secondary | ICD-10-CM | POA: Diagnosis not present

## 2017-03-26 DIAGNOSIS — D509 Iron deficiency anemia, unspecified: Secondary | ICD-10-CM | POA: Diagnosis not present

## 2017-03-29 DIAGNOSIS — D509 Iron deficiency anemia, unspecified: Secondary | ICD-10-CM | POA: Diagnosis not present

## 2017-03-29 DIAGNOSIS — D631 Anemia in chronic kidney disease: Secondary | ICD-10-CM | POA: Diagnosis not present

## 2017-03-29 DIAGNOSIS — N2581 Secondary hyperparathyroidism of renal origin: Secondary | ICD-10-CM | POA: Diagnosis not present

## 2017-03-29 DIAGNOSIS — N186 End stage renal disease: Secondary | ICD-10-CM | POA: Diagnosis not present

## 2017-03-31 DIAGNOSIS — N186 End stage renal disease: Secondary | ICD-10-CM | POA: Diagnosis not present

## 2017-03-31 DIAGNOSIS — N2581 Secondary hyperparathyroidism of renal origin: Secondary | ICD-10-CM | POA: Diagnosis not present

## 2017-03-31 DIAGNOSIS — D631 Anemia in chronic kidney disease: Secondary | ICD-10-CM | POA: Diagnosis not present

## 2017-03-31 DIAGNOSIS — D509 Iron deficiency anemia, unspecified: Secondary | ICD-10-CM | POA: Diagnosis not present

## 2017-04-02 DIAGNOSIS — D509 Iron deficiency anemia, unspecified: Secondary | ICD-10-CM | POA: Diagnosis not present

## 2017-04-02 DIAGNOSIS — D631 Anemia in chronic kidney disease: Secondary | ICD-10-CM | POA: Diagnosis not present

## 2017-04-02 DIAGNOSIS — N186 End stage renal disease: Secondary | ICD-10-CM | POA: Diagnosis not present

## 2017-04-02 DIAGNOSIS — N2581 Secondary hyperparathyroidism of renal origin: Secondary | ICD-10-CM | POA: Diagnosis not present

## 2017-04-05 DIAGNOSIS — D509 Iron deficiency anemia, unspecified: Secondary | ICD-10-CM | POA: Diagnosis not present

## 2017-04-05 DIAGNOSIS — N186 End stage renal disease: Secondary | ICD-10-CM | POA: Diagnosis not present

## 2017-04-05 DIAGNOSIS — N2581 Secondary hyperparathyroidism of renal origin: Secondary | ICD-10-CM | POA: Diagnosis not present

## 2017-04-05 DIAGNOSIS — D631 Anemia in chronic kidney disease: Secondary | ICD-10-CM | POA: Diagnosis not present

## 2017-04-07 DIAGNOSIS — D509 Iron deficiency anemia, unspecified: Secondary | ICD-10-CM | POA: Diagnosis not present

## 2017-04-07 DIAGNOSIS — D631 Anemia in chronic kidney disease: Secondary | ICD-10-CM | POA: Diagnosis not present

## 2017-04-07 DIAGNOSIS — N186 End stage renal disease: Secondary | ICD-10-CM | POA: Diagnosis not present

## 2017-04-07 DIAGNOSIS — N2581 Secondary hyperparathyroidism of renal origin: Secondary | ICD-10-CM | POA: Diagnosis not present

## 2017-04-09 DIAGNOSIS — N186 End stage renal disease: Secondary | ICD-10-CM | POA: Diagnosis not present

## 2017-04-09 DIAGNOSIS — N2581 Secondary hyperparathyroidism of renal origin: Secondary | ICD-10-CM | POA: Diagnosis not present

## 2017-04-09 DIAGNOSIS — D631 Anemia in chronic kidney disease: Secondary | ICD-10-CM | POA: Diagnosis not present

## 2017-04-09 DIAGNOSIS — D509 Iron deficiency anemia, unspecified: Secondary | ICD-10-CM | POA: Diagnosis not present

## 2017-04-12 DIAGNOSIS — D509 Iron deficiency anemia, unspecified: Secondary | ICD-10-CM | POA: Diagnosis not present

## 2017-04-12 DIAGNOSIS — N186 End stage renal disease: Secondary | ICD-10-CM | POA: Diagnosis not present

## 2017-04-12 DIAGNOSIS — D631 Anemia in chronic kidney disease: Secondary | ICD-10-CM | POA: Diagnosis not present

## 2017-04-12 DIAGNOSIS — N2581 Secondary hyperparathyroidism of renal origin: Secondary | ICD-10-CM | POA: Diagnosis not present

## 2017-04-14 DIAGNOSIS — N186 End stage renal disease: Secondary | ICD-10-CM | POA: Diagnosis not present

## 2017-04-14 DIAGNOSIS — N2581 Secondary hyperparathyroidism of renal origin: Secondary | ICD-10-CM | POA: Diagnosis not present

## 2017-04-14 DIAGNOSIS — D631 Anemia in chronic kidney disease: Secondary | ICD-10-CM | POA: Diagnosis not present

## 2017-04-14 DIAGNOSIS — D509 Iron deficiency anemia, unspecified: Secondary | ICD-10-CM | POA: Diagnosis not present

## 2017-04-16 DIAGNOSIS — D631 Anemia in chronic kidney disease: Secondary | ICD-10-CM | POA: Diagnosis not present

## 2017-04-16 DIAGNOSIS — N2581 Secondary hyperparathyroidism of renal origin: Secondary | ICD-10-CM | POA: Diagnosis not present

## 2017-04-16 DIAGNOSIS — N186 End stage renal disease: Secondary | ICD-10-CM | POA: Diagnosis not present

## 2017-04-16 DIAGNOSIS — D509 Iron deficiency anemia, unspecified: Secondary | ICD-10-CM | POA: Diagnosis not present

## 2017-04-19 DIAGNOSIS — D631 Anemia in chronic kidney disease: Secondary | ICD-10-CM | POA: Diagnosis not present

## 2017-04-19 DIAGNOSIS — N2581 Secondary hyperparathyroidism of renal origin: Secondary | ICD-10-CM | POA: Diagnosis not present

## 2017-04-19 DIAGNOSIS — N186 End stage renal disease: Secondary | ICD-10-CM | POA: Diagnosis not present

## 2017-04-19 DIAGNOSIS — D509 Iron deficiency anemia, unspecified: Secondary | ICD-10-CM | POA: Diagnosis not present

## 2017-04-21 DIAGNOSIS — D509 Iron deficiency anemia, unspecified: Secondary | ICD-10-CM | POA: Diagnosis not present

## 2017-04-21 DIAGNOSIS — D631 Anemia in chronic kidney disease: Secondary | ICD-10-CM | POA: Diagnosis not present

## 2017-04-21 DIAGNOSIS — N186 End stage renal disease: Secondary | ICD-10-CM | POA: Diagnosis not present

## 2017-04-21 DIAGNOSIS — N2581 Secondary hyperparathyroidism of renal origin: Secondary | ICD-10-CM | POA: Diagnosis not present

## 2017-04-23 DIAGNOSIS — D631 Anemia in chronic kidney disease: Secondary | ICD-10-CM | POA: Diagnosis not present

## 2017-04-23 DIAGNOSIS — Z992 Dependence on renal dialysis: Secondary | ICD-10-CM | POA: Diagnosis not present

## 2017-04-23 DIAGNOSIS — N2581 Secondary hyperparathyroidism of renal origin: Secondary | ICD-10-CM | POA: Diagnosis not present

## 2017-04-23 DIAGNOSIS — D509 Iron deficiency anemia, unspecified: Secondary | ICD-10-CM | POA: Diagnosis not present

## 2017-04-23 DIAGNOSIS — N186 End stage renal disease: Secondary | ICD-10-CM | POA: Diagnosis not present

## 2017-04-23 DIAGNOSIS — I129 Hypertensive chronic kidney disease with stage 1 through stage 4 chronic kidney disease, or unspecified chronic kidney disease: Secondary | ICD-10-CM | POA: Diagnosis not present

## 2017-04-26 DIAGNOSIS — N186 End stage renal disease: Secondary | ICD-10-CM | POA: Diagnosis not present

## 2017-04-26 DIAGNOSIS — D509 Iron deficiency anemia, unspecified: Secondary | ICD-10-CM | POA: Diagnosis not present

## 2017-04-26 DIAGNOSIS — D631 Anemia in chronic kidney disease: Secondary | ICD-10-CM | POA: Diagnosis not present

## 2017-04-26 DIAGNOSIS — Z23 Encounter for immunization: Secondary | ICD-10-CM | POA: Diagnosis not present

## 2017-04-26 DIAGNOSIS — N2581 Secondary hyperparathyroidism of renal origin: Secondary | ICD-10-CM | POA: Diagnosis not present

## 2017-04-28 DIAGNOSIS — Z23 Encounter for immunization: Secondary | ICD-10-CM | POA: Diagnosis not present

## 2017-04-28 DIAGNOSIS — N186 End stage renal disease: Secondary | ICD-10-CM | POA: Diagnosis not present

## 2017-04-28 DIAGNOSIS — D631 Anemia in chronic kidney disease: Secondary | ICD-10-CM | POA: Diagnosis not present

## 2017-04-28 DIAGNOSIS — D509 Iron deficiency anemia, unspecified: Secondary | ICD-10-CM | POA: Diagnosis not present

## 2017-04-28 DIAGNOSIS — N2581 Secondary hyperparathyroidism of renal origin: Secondary | ICD-10-CM | POA: Diagnosis not present

## 2017-04-30 DIAGNOSIS — D631 Anemia in chronic kidney disease: Secondary | ICD-10-CM | POA: Diagnosis not present

## 2017-04-30 DIAGNOSIS — D509 Iron deficiency anemia, unspecified: Secondary | ICD-10-CM | POA: Diagnosis not present

## 2017-04-30 DIAGNOSIS — N186 End stage renal disease: Secondary | ICD-10-CM | POA: Diagnosis not present

## 2017-04-30 DIAGNOSIS — Z23 Encounter for immunization: Secondary | ICD-10-CM | POA: Diagnosis not present

## 2017-04-30 DIAGNOSIS — N2581 Secondary hyperparathyroidism of renal origin: Secondary | ICD-10-CM | POA: Diagnosis not present

## 2017-05-03 DIAGNOSIS — Z23 Encounter for immunization: Secondary | ICD-10-CM | POA: Diagnosis not present

## 2017-05-03 DIAGNOSIS — D509 Iron deficiency anemia, unspecified: Secondary | ICD-10-CM | POA: Diagnosis not present

## 2017-05-03 DIAGNOSIS — N186 End stage renal disease: Secondary | ICD-10-CM | POA: Diagnosis not present

## 2017-05-03 DIAGNOSIS — D631 Anemia in chronic kidney disease: Secondary | ICD-10-CM | POA: Diagnosis not present

## 2017-05-03 DIAGNOSIS — N2581 Secondary hyperparathyroidism of renal origin: Secondary | ICD-10-CM | POA: Diagnosis not present

## 2017-05-05 DIAGNOSIS — D509 Iron deficiency anemia, unspecified: Secondary | ICD-10-CM | POA: Diagnosis not present

## 2017-05-05 DIAGNOSIS — N186 End stage renal disease: Secondary | ICD-10-CM | POA: Diagnosis not present

## 2017-05-05 DIAGNOSIS — Z23 Encounter for immunization: Secondary | ICD-10-CM | POA: Diagnosis not present

## 2017-05-05 DIAGNOSIS — N2581 Secondary hyperparathyroidism of renal origin: Secondary | ICD-10-CM | POA: Diagnosis not present

## 2017-05-05 DIAGNOSIS — D631 Anemia in chronic kidney disease: Secondary | ICD-10-CM | POA: Diagnosis not present

## 2017-05-06 DIAGNOSIS — N186 End stage renal disease: Secondary | ICD-10-CM | POA: Diagnosis not present

## 2017-05-06 DIAGNOSIS — Z992 Dependence on renal dialysis: Secondary | ICD-10-CM | POA: Diagnosis not present

## 2017-05-06 DIAGNOSIS — I871 Compression of vein: Secondary | ICD-10-CM | POA: Diagnosis not present

## 2017-05-06 DIAGNOSIS — T82858A Stenosis of vascular prosthetic devices, implants and grafts, initial encounter: Secondary | ICD-10-CM | POA: Diagnosis not present

## 2017-05-07 DIAGNOSIS — N2581 Secondary hyperparathyroidism of renal origin: Secondary | ICD-10-CM | POA: Diagnosis not present

## 2017-05-07 DIAGNOSIS — D631 Anemia in chronic kidney disease: Secondary | ICD-10-CM | POA: Diagnosis not present

## 2017-05-07 DIAGNOSIS — D509 Iron deficiency anemia, unspecified: Secondary | ICD-10-CM | POA: Diagnosis not present

## 2017-05-07 DIAGNOSIS — N186 End stage renal disease: Secondary | ICD-10-CM | POA: Diagnosis not present

## 2017-05-07 DIAGNOSIS — Z23 Encounter for immunization: Secondary | ICD-10-CM | POA: Diagnosis not present

## 2017-05-10 DIAGNOSIS — Z23 Encounter for immunization: Secondary | ICD-10-CM | POA: Diagnosis not present

## 2017-05-10 DIAGNOSIS — D509 Iron deficiency anemia, unspecified: Secondary | ICD-10-CM | POA: Diagnosis not present

## 2017-05-10 DIAGNOSIS — N2581 Secondary hyperparathyroidism of renal origin: Secondary | ICD-10-CM | POA: Diagnosis not present

## 2017-05-10 DIAGNOSIS — D631 Anemia in chronic kidney disease: Secondary | ICD-10-CM | POA: Diagnosis not present

## 2017-05-10 DIAGNOSIS — N186 End stage renal disease: Secondary | ICD-10-CM | POA: Diagnosis not present

## 2017-05-12 DIAGNOSIS — N2581 Secondary hyperparathyroidism of renal origin: Secondary | ICD-10-CM | POA: Diagnosis not present

## 2017-05-12 DIAGNOSIS — D631 Anemia in chronic kidney disease: Secondary | ICD-10-CM | POA: Diagnosis not present

## 2017-05-12 DIAGNOSIS — D509 Iron deficiency anemia, unspecified: Secondary | ICD-10-CM | POA: Diagnosis not present

## 2017-05-12 DIAGNOSIS — Z23 Encounter for immunization: Secondary | ICD-10-CM | POA: Diagnosis not present

## 2017-05-12 DIAGNOSIS — N186 End stage renal disease: Secondary | ICD-10-CM | POA: Diagnosis not present

## 2017-05-14 DIAGNOSIS — D631 Anemia in chronic kidney disease: Secondary | ICD-10-CM | POA: Diagnosis not present

## 2017-05-14 DIAGNOSIS — N2581 Secondary hyperparathyroidism of renal origin: Secondary | ICD-10-CM | POA: Diagnosis not present

## 2017-05-14 DIAGNOSIS — N186 End stage renal disease: Secondary | ICD-10-CM | POA: Diagnosis not present

## 2017-05-14 DIAGNOSIS — D509 Iron deficiency anemia, unspecified: Secondary | ICD-10-CM | POA: Diagnosis not present

## 2017-05-14 DIAGNOSIS — Z23 Encounter for immunization: Secondary | ICD-10-CM | POA: Diagnosis not present

## 2017-05-17 DIAGNOSIS — N186 End stage renal disease: Secondary | ICD-10-CM | POA: Diagnosis not present

## 2017-05-17 DIAGNOSIS — D631 Anemia in chronic kidney disease: Secondary | ICD-10-CM | POA: Diagnosis not present

## 2017-05-17 DIAGNOSIS — D509 Iron deficiency anemia, unspecified: Secondary | ICD-10-CM | POA: Diagnosis not present

## 2017-05-17 DIAGNOSIS — N2581 Secondary hyperparathyroidism of renal origin: Secondary | ICD-10-CM | POA: Diagnosis not present

## 2017-05-17 DIAGNOSIS — Z23 Encounter for immunization: Secondary | ICD-10-CM | POA: Diagnosis not present

## 2017-05-18 DIAGNOSIS — Z7682 Awaiting organ transplant status: Secondary | ICD-10-CM | POA: Diagnosis not present

## 2017-05-19 DIAGNOSIS — Z23 Encounter for immunization: Secondary | ICD-10-CM | POA: Diagnosis not present

## 2017-05-19 DIAGNOSIS — N186 End stage renal disease: Secondary | ICD-10-CM | POA: Diagnosis not present

## 2017-05-19 DIAGNOSIS — N2581 Secondary hyperparathyroidism of renal origin: Secondary | ICD-10-CM | POA: Diagnosis not present

## 2017-05-19 DIAGNOSIS — D509 Iron deficiency anemia, unspecified: Secondary | ICD-10-CM | POA: Diagnosis not present

## 2017-05-19 DIAGNOSIS — D631 Anemia in chronic kidney disease: Secondary | ICD-10-CM | POA: Diagnosis not present

## 2017-05-21 DIAGNOSIS — D631 Anemia in chronic kidney disease: Secondary | ICD-10-CM | POA: Diagnosis not present

## 2017-05-21 DIAGNOSIS — N2581 Secondary hyperparathyroidism of renal origin: Secondary | ICD-10-CM | POA: Diagnosis not present

## 2017-05-21 DIAGNOSIS — D509 Iron deficiency anemia, unspecified: Secondary | ICD-10-CM | POA: Diagnosis not present

## 2017-05-21 DIAGNOSIS — Z23 Encounter for immunization: Secondary | ICD-10-CM | POA: Diagnosis not present

## 2017-05-21 DIAGNOSIS — N186 End stage renal disease: Secondary | ICD-10-CM | POA: Diagnosis not present

## 2017-05-23 DIAGNOSIS — N186 End stage renal disease: Secondary | ICD-10-CM | POA: Diagnosis not present

## 2017-05-23 DIAGNOSIS — Z992 Dependence on renal dialysis: Secondary | ICD-10-CM | POA: Diagnosis not present

## 2017-05-23 DIAGNOSIS — I129 Hypertensive chronic kidney disease with stage 1 through stage 4 chronic kidney disease, or unspecified chronic kidney disease: Secondary | ICD-10-CM | POA: Diagnosis not present

## 2017-05-24 DIAGNOSIS — N2581 Secondary hyperparathyroidism of renal origin: Secondary | ICD-10-CM | POA: Diagnosis not present

## 2017-05-24 DIAGNOSIS — D509 Iron deficiency anemia, unspecified: Secondary | ICD-10-CM | POA: Diagnosis not present

## 2017-05-24 DIAGNOSIS — N186 End stage renal disease: Secondary | ICD-10-CM | POA: Diagnosis not present

## 2017-05-26 DIAGNOSIS — N2581 Secondary hyperparathyroidism of renal origin: Secondary | ICD-10-CM | POA: Diagnosis not present

## 2017-05-26 DIAGNOSIS — D509 Iron deficiency anemia, unspecified: Secondary | ICD-10-CM | POA: Diagnosis not present

## 2017-05-26 DIAGNOSIS — N186 End stage renal disease: Secondary | ICD-10-CM | POA: Diagnosis not present

## 2017-05-28 DIAGNOSIS — N186 End stage renal disease: Secondary | ICD-10-CM | POA: Diagnosis not present

## 2017-05-28 DIAGNOSIS — D509 Iron deficiency anemia, unspecified: Secondary | ICD-10-CM | POA: Diagnosis not present

## 2017-05-28 DIAGNOSIS — N2581 Secondary hyperparathyroidism of renal origin: Secondary | ICD-10-CM | POA: Diagnosis not present

## 2017-05-31 DIAGNOSIS — N186 End stage renal disease: Secondary | ICD-10-CM | POA: Diagnosis not present

## 2017-05-31 DIAGNOSIS — D509 Iron deficiency anemia, unspecified: Secondary | ICD-10-CM | POA: Diagnosis not present

## 2017-05-31 DIAGNOSIS — N2581 Secondary hyperparathyroidism of renal origin: Secondary | ICD-10-CM | POA: Diagnosis not present

## 2017-06-02 DIAGNOSIS — N186 End stage renal disease: Secondary | ICD-10-CM | POA: Diagnosis not present

## 2017-06-02 DIAGNOSIS — D509 Iron deficiency anemia, unspecified: Secondary | ICD-10-CM | POA: Diagnosis not present

## 2017-06-02 DIAGNOSIS — N2581 Secondary hyperparathyroidism of renal origin: Secondary | ICD-10-CM | POA: Diagnosis not present

## 2017-06-04 DIAGNOSIS — N2581 Secondary hyperparathyroidism of renal origin: Secondary | ICD-10-CM | POA: Diagnosis not present

## 2017-06-04 DIAGNOSIS — N186 End stage renal disease: Secondary | ICD-10-CM | POA: Diagnosis not present

## 2017-06-04 DIAGNOSIS — D509 Iron deficiency anemia, unspecified: Secondary | ICD-10-CM | POA: Diagnosis not present

## 2017-06-07 DIAGNOSIS — D509 Iron deficiency anemia, unspecified: Secondary | ICD-10-CM | POA: Diagnosis not present

## 2017-06-07 DIAGNOSIS — N186 End stage renal disease: Secondary | ICD-10-CM | POA: Diagnosis not present

## 2017-06-07 DIAGNOSIS — N2581 Secondary hyperparathyroidism of renal origin: Secondary | ICD-10-CM | POA: Diagnosis not present

## 2017-06-09 DIAGNOSIS — D509 Iron deficiency anemia, unspecified: Secondary | ICD-10-CM | POA: Diagnosis not present

## 2017-06-09 DIAGNOSIS — N2581 Secondary hyperparathyroidism of renal origin: Secondary | ICD-10-CM | POA: Diagnosis not present

## 2017-06-09 DIAGNOSIS — N186 End stage renal disease: Secondary | ICD-10-CM | POA: Diagnosis not present

## 2017-06-11 DIAGNOSIS — N186 End stage renal disease: Secondary | ICD-10-CM | POA: Diagnosis not present

## 2017-06-11 DIAGNOSIS — D509 Iron deficiency anemia, unspecified: Secondary | ICD-10-CM | POA: Diagnosis not present

## 2017-06-11 DIAGNOSIS — N2581 Secondary hyperparathyroidism of renal origin: Secondary | ICD-10-CM | POA: Diagnosis not present

## 2017-06-14 DIAGNOSIS — N186 End stage renal disease: Secondary | ICD-10-CM | POA: Diagnosis not present

## 2017-06-14 DIAGNOSIS — N2581 Secondary hyperparathyroidism of renal origin: Secondary | ICD-10-CM | POA: Diagnosis not present

## 2017-06-14 DIAGNOSIS — D509 Iron deficiency anemia, unspecified: Secondary | ICD-10-CM | POA: Diagnosis not present

## 2017-06-16 DIAGNOSIS — N2581 Secondary hyperparathyroidism of renal origin: Secondary | ICD-10-CM | POA: Diagnosis not present

## 2017-06-16 DIAGNOSIS — D509 Iron deficiency anemia, unspecified: Secondary | ICD-10-CM | POA: Diagnosis not present

## 2017-06-16 DIAGNOSIS — N186 End stage renal disease: Secondary | ICD-10-CM | POA: Diagnosis not present

## 2017-06-18 DIAGNOSIS — N186 End stage renal disease: Secondary | ICD-10-CM | POA: Diagnosis not present

## 2017-06-18 DIAGNOSIS — N2581 Secondary hyperparathyroidism of renal origin: Secondary | ICD-10-CM | POA: Diagnosis not present

## 2017-06-18 DIAGNOSIS — D509 Iron deficiency anemia, unspecified: Secondary | ICD-10-CM | POA: Diagnosis not present

## 2017-06-21 DIAGNOSIS — N2581 Secondary hyperparathyroidism of renal origin: Secondary | ICD-10-CM | POA: Diagnosis not present

## 2017-06-21 DIAGNOSIS — N186 End stage renal disease: Secondary | ICD-10-CM | POA: Diagnosis not present

## 2017-06-21 DIAGNOSIS — D509 Iron deficiency anemia, unspecified: Secondary | ICD-10-CM | POA: Diagnosis not present

## 2017-06-23 DIAGNOSIS — N186 End stage renal disease: Secondary | ICD-10-CM | POA: Diagnosis not present

## 2017-06-23 DIAGNOSIS — I129 Hypertensive chronic kidney disease with stage 1 through stage 4 chronic kidney disease, or unspecified chronic kidney disease: Secondary | ICD-10-CM | POA: Diagnosis not present

## 2017-06-23 DIAGNOSIS — Z992 Dependence on renal dialysis: Secondary | ICD-10-CM | POA: Diagnosis not present

## 2017-06-23 DIAGNOSIS — D509 Iron deficiency anemia, unspecified: Secondary | ICD-10-CM | POA: Diagnosis not present

## 2017-06-23 DIAGNOSIS — N2581 Secondary hyperparathyroidism of renal origin: Secondary | ICD-10-CM | POA: Diagnosis not present

## 2017-06-25 DIAGNOSIS — N186 End stage renal disease: Secondary | ICD-10-CM | POA: Diagnosis not present

## 2017-06-25 DIAGNOSIS — D509 Iron deficiency anemia, unspecified: Secondary | ICD-10-CM | POA: Diagnosis not present

## 2017-06-25 DIAGNOSIS — N2581 Secondary hyperparathyroidism of renal origin: Secondary | ICD-10-CM | POA: Diagnosis not present

## 2017-06-25 DIAGNOSIS — D631 Anemia in chronic kidney disease: Secondary | ICD-10-CM | POA: Diagnosis not present

## 2017-06-28 DIAGNOSIS — N186 End stage renal disease: Secondary | ICD-10-CM | POA: Diagnosis not present

## 2017-06-28 DIAGNOSIS — D509 Iron deficiency anemia, unspecified: Secondary | ICD-10-CM | POA: Diagnosis not present

## 2017-06-28 DIAGNOSIS — N2581 Secondary hyperparathyroidism of renal origin: Secondary | ICD-10-CM | POA: Diagnosis not present

## 2017-06-28 DIAGNOSIS — D631 Anemia in chronic kidney disease: Secondary | ICD-10-CM | POA: Diagnosis not present

## 2017-06-30 DIAGNOSIS — D509 Iron deficiency anemia, unspecified: Secondary | ICD-10-CM | POA: Diagnosis not present

## 2017-06-30 DIAGNOSIS — D631 Anemia in chronic kidney disease: Secondary | ICD-10-CM | POA: Diagnosis not present

## 2017-06-30 DIAGNOSIS — N2581 Secondary hyperparathyroidism of renal origin: Secondary | ICD-10-CM | POA: Diagnosis not present

## 2017-06-30 DIAGNOSIS — N186 End stage renal disease: Secondary | ICD-10-CM | POA: Diagnosis not present

## 2017-07-02 DIAGNOSIS — N186 End stage renal disease: Secondary | ICD-10-CM | POA: Diagnosis not present

## 2017-07-02 DIAGNOSIS — D631 Anemia in chronic kidney disease: Secondary | ICD-10-CM | POA: Diagnosis not present

## 2017-07-02 DIAGNOSIS — N2581 Secondary hyperparathyroidism of renal origin: Secondary | ICD-10-CM | POA: Diagnosis not present

## 2017-07-02 DIAGNOSIS — D509 Iron deficiency anemia, unspecified: Secondary | ICD-10-CM | POA: Diagnosis not present

## 2017-07-05 DIAGNOSIS — D509 Iron deficiency anemia, unspecified: Secondary | ICD-10-CM | POA: Diagnosis not present

## 2017-07-05 DIAGNOSIS — N186 End stage renal disease: Secondary | ICD-10-CM | POA: Diagnosis not present

## 2017-07-05 DIAGNOSIS — N2581 Secondary hyperparathyroidism of renal origin: Secondary | ICD-10-CM | POA: Diagnosis not present

## 2017-07-05 DIAGNOSIS — D631 Anemia in chronic kidney disease: Secondary | ICD-10-CM | POA: Diagnosis not present

## 2017-07-07 DIAGNOSIS — N2581 Secondary hyperparathyroidism of renal origin: Secondary | ICD-10-CM | POA: Diagnosis not present

## 2017-07-07 DIAGNOSIS — D631 Anemia in chronic kidney disease: Secondary | ICD-10-CM | POA: Diagnosis not present

## 2017-07-07 DIAGNOSIS — D509 Iron deficiency anemia, unspecified: Secondary | ICD-10-CM | POA: Diagnosis not present

## 2017-07-07 DIAGNOSIS — N186 End stage renal disease: Secondary | ICD-10-CM | POA: Diagnosis not present

## 2017-07-09 DIAGNOSIS — N186 End stage renal disease: Secondary | ICD-10-CM | POA: Diagnosis not present

## 2017-07-09 DIAGNOSIS — D509 Iron deficiency anemia, unspecified: Secondary | ICD-10-CM | POA: Diagnosis not present

## 2017-07-09 DIAGNOSIS — N2581 Secondary hyperparathyroidism of renal origin: Secondary | ICD-10-CM | POA: Diagnosis not present

## 2017-07-09 DIAGNOSIS — D631 Anemia in chronic kidney disease: Secondary | ICD-10-CM | POA: Diagnosis not present

## 2017-07-11 DIAGNOSIS — D631 Anemia in chronic kidney disease: Secondary | ICD-10-CM | POA: Diagnosis not present

## 2017-07-11 DIAGNOSIS — N2581 Secondary hyperparathyroidism of renal origin: Secondary | ICD-10-CM | POA: Diagnosis not present

## 2017-07-11 DIAGNOSIS — D509 Iron deficiency anemia, unspecified: Secondary | ICD-10-CM | POA: Diagnosis not present

## 2017-07-11 DIAGNOSIS — N186 End stage renal disease: Secondary | ICD-10-CM | POA: Diagnosis not present

## 2017-07-13 DIAGNOSIS — N186 End stage renal disease: Secondary | ICD-10-CM | POA: Diagnosis not present

## 2017-07-13 DIAGNOSIS — N2581 Secondary hyperparathyroidism of renal origin: Secondary | ICD-10-CM | POA: Diagnosis not present

## 2017-07-13 DIAGNOSIS — D509 Iron deficiency anemia, unspecified: Secondary | ICD-10-CM | POA: Diagnosis not present

## 2017-07-13 DIAGNOSIS — D631 Anemia in chronic kidney disease: Secondary | ICD-10-CM | POA: Diagnosis not present

## 2017-07-14 ENCOUNTER — Other Ambulatory Visit (HOSPITAL_COMMUNITY)
Admission: RE | Admit: 2017-07-14 | Discharge: 2017-07-14 | Disposition: A | Discharge status: Home / Self Care | Payer: Medicare Other | Source: Ambulatory Visit | Attending: Family Medicine | Admitting: Family Medicine

## 2017-07-14 ENCOUNTER — Encounter: Payer: Self-pay | Admitting: Internal Medicine

## 2017-07-14 ENCOUNTER — Ambulatory Visit (INDEPENDENT_AMBULATORY_CARE_PROVIDER_SITE_OTHER): Payer: Medicare Other | Admitting: Internal Medicine

## 2017-07-14 ENCOUNTER — Other Ambulatory Visit: Payer: Self-pay

## 2017-07-14 VITALS — BP 160/95 | HR 75 | Temp 98.1°F | Ht 67.0 in | Wt 141.4 lb

## 2017-07-14 DIAGNOSIS — M545 Low back pain, unspecified: Secondary | ICD-10-CM | POA: Insufficient documentation

## 2017-07-14 DIAGNOSIS — N898 Other specified noninflammatory disorders of vagina: Secondary | ICD-10-CM | POA: Diagnosis not present

## 2017-07-14 LAB — POCT WET PREP (WET MOUNT)
Clue Cells Wet Prep Whiff POC: NEGATIVE
Trichomonas Wet Prep HPF POC: ABSENT

## 2017-07-14 MED ORDER — FLUCONAZOLE 150 MG PO TABS: 150.0000 mg | ORAL_TABLET | Freq: Once | ORAL | 0 refills | Status: AC

## 2017-07-14 MED ORDER — CYCLOBENZAPRINE HCL 5 MG PO TABS: 5.0000 mg | ORAL_TABLET | Freq: Three times a day (TID) | ORAL | 0 refills | Status: DC | PRN

## 2017-07-14 NOTE — Assessment & Plan Note (Signed)
Vaginal discharge consistent with yeast infection.  Wet prep positive for yeast.  Has not been sexually active in 3 years.  Not interested in HIV or RPR - POCT Wet Prep [facility]) - Cervicovaginal ancillary only - fluconazole (DIFLUCAN) 150 MG tablet; Take 1 tablet (150 mg total) by mouth once for 1 dose.  Dispense: 1 tablet; Refill: 0

## 2017-07-14 NOTE — Progress Notes (Signed)
   [facility] Kerrin Mo, MD Phone: [phone]  Reason For Visit: Follow up   #Vaginal Irritation  She has had vaginal irritation for the past 4-5 days.  She used any new soap and she felt like after using that soap she became irritated.  She also notes some discharge she describes it as a dry discharge, not a white or curdy-like discharge.  She denies any significant smell associated with this discharge.. She states she has not been sexually active for 3 years.  Denies any vomiting, fevers, pelvic pain.  She does note a history of 2-3 months of low back pain.  She is worried that this could possibly be a urinary tract infection.  Patient does have ESRD and does not make urine. No ulcers.   #Low back pain Patient states she has had low back pain can radiates to both sides of her back.  Lasts about 20-30 seconds to maybe 1 minutes.  She states this is been going on for about 3 months.  She has intermittently is not constant.  No history of trauma or injury.  States she had a similar pain associated with a bladder infection she had about a year ago  History of cancer: None Weak immune system:  ESRD, due to glomerulonephritis History of IV drug use: none  History of steroid use: none  Incontinence of bowel or bladder:  None  Numbness of leg: none  Fever: none Rest or Night pain: none Weight Loss:  None    Past Medical History Reviewed problem list.  Medications- reviewed and updated No additions to family history Social history- patient is a non- smoker  Objective: BP (!) 160/95 (BP Location: Left Arm, Patient Position: Sitting, Cuff Size: Normal)   Pulse 75   Temp 98.1 F (36.7 C) (Oral)   Wt 141 lb 6.4 oz (64.1 kg)   SpO2 99%   BMI 22.15 kg/m  Gen: NAD, alert, cooperative with exam Cardio: regular rate and rhythm, S1S2 heard, no murmurs appreciated Pulm: clear to auscultation bilaterally, no wheezes, rhonchi or rales GI: soft, non-tender,  non-distended, bowel sounds present, no hepatomegaly, no splenomegaly GU: external vaginal tissue small laceration from shaving, cervix wnl, no punctate lesions on cervix appreciated,  Discharge in vaginal vault, no cervical motion tenderness, no abdominal/ adnexal masse Back: No abnormalities noted on inspection, no tenderness to palpation of low back, possibly some slight tenderness along the sacroiliac processes, normal range of motion, 5 out of 5 strength   Assessment/Plan: See problem based a/p  Pain in lower back No red flags associated with patient's back pain.  Intermittent, lasting less than 30 seconds, makes me think that this is likely benign.  Patient is immunosuppressed as she has ESRD.  Otherwise no red flags.  Normal physical exam -Will give patient Flexeril and encouraged Tylenol -Follow-up in 2 weeks if no improvement or worsening   Vaginal discharge Vaginal discharge consistent with yeast infection.  Wet prep positive for yeast.  Has not been sexually active in 3 years.  Not interested in HIV or RPR - POCT Wet Prep [facility]) - Cervicovaginal ancillary only - fluconazole (DIFLUCAN) 150 MG tablet; Take 1 tablet (150 mg total) by mouth once for 1 dose.  Dispense: 1 tablet; Refill: 0

## 2017-07-14 NOTE — Assessment & Plan Note (Signed)
No red flags associated with patient's back pain.  Intermittent, lasting less than 30 seconds, makes me think that this is likely benign.  Patient is immunosuppressed as she has ESRD.  Otherwise no red flags.  Normal physical exam -Will give patient Flexeril and encouraged Tylenol -Follow-up in 2 weeks if no improvement or worsening

## 2017-07-14 NOTE — Patient Instructions (Addendum)
It was nice seeing you today.  I have sent a Diflucan to the pharmacy for you to pick up for your yeast infection.  For your back pain as discussed if it worsens or does not improve please return in about 2 weeks or sooner. We will consider getting x-rays at that next visit.  In the meantime I am going to give you Flexeril to see if that helps at all with the pain.

## 2017-07-16 DIAGNOSIS — N186 End stage renal disease: Secondary | ICD-10-CM | POA: Diagnosis not present

## 2017-07-16 DIAGNOSIS — D509 Iron deficiency anemia, unspecified: Secondary | ICD-10-CM | POA: Diagnosis not present

## 2017-07-16 DIAGNOSIS — N2581 Secondary hyperparathyroidism of renal origin: Secondary | ICD-10-CM | POA: Diagnosis not present

## 2017-07-16 DIAGNOSIS — D631 Anemia in chronic kidney disease: Secondary | ICD-10-CM | POA: Diagnosis not present

## 2017-07-16 LAB — CERVICOVAGINAL ANCILLARY ONLY
Chlamydia: NEGATIVE
Neisseria Gonorrhea: NEGATIVE

## 2017-07-19 ENCOUNTER — Telehealth: Payer: Self-pay | Admitting: *Deleted

## 2017-07-19 DIAGNOSIS — D509 Iron deficiency anemia, unspecified: Secondary | ICD-10-CM | POA: Diagnosis not present

## 2017-07-19 DIAGNOSIS — D631 Anemia in chronic kidney disease: Secondary | ICD-10-CM | POA: Diagnosis not present

## 2017-07-19 DIAGNOSIS — N2581 Secondary hyperparathyroidism of renal origin: Secondary | ICD-10-CM | POA: Diagnosis not present

## 2017-07-19 DIAGNOSIS — N186 End stage renal disease: Secondary | ICD-10-CM | POA: Diagnosis not present

## 2017-07-19 NOTE — Telephone Encounter (Signed)
Pt states that she is still having vaginal itching.  She states that this happened before and she needed 2 doses of diflucan.  Will forward to MD. Fleeger, Salome Spotted, [place]

## 2017-07-20 MED ORDER — FLUCONAZOLE 150 MG PO TABS: 150.0000 mg | ORAL_TABLET | Freq: Once | ORAL | 1 refills | Status: AC

## 2017-07-20 NOTE — Telephone Encounter (Signed)
Sent in another prescription. Please let patient know

## 2017-07-20 NOTE — Telephone Encounter (Signed)
Patient notified. Patient very thankful to PCP. Hubbard Hartshorn, RN, BSN

## 2017-07-21 DIAGNOSIS — D509 Iron deficiency anemia, unspecified: Secondary | ICD-10-CM | POA: Diagnosis not present

## 2017-07-21 DIAGNOSIS — N186 End stage renal disease: Secondary | ICD-10-CM | POA: Diagnosis not present

## 2017-07-21 DIAGNOSIS — D631 Anemia in chronic kidney disease: Secondary | ICD-10-CM | POA: Diagnosis not present

## 2017-07-21 DIAGNOSIS — N2581 Secondary hyperparathyroidism of renal origin: Secondary | ICD-10-CM | POA: Diagnosis not present

## 2017-07-22 ENCOUNTER — Encounter: Payer: Self-pay | Admitting: Internal Medicine

## 2017-07-23 DIAGNOSIS — I129 Hypertensive chronic kidney disease with stage 1 through stage 4 chronic kidney disease, or unspecified chronic kidney disease: Secondary | ICD-10-CM | POA: Diagnosis not present

## 2017-07-23 DIAGNOSIS — N2581 Secondary hyperparathyroidism of renal origin: Secondary | ICD-10-CM | POA: Diagnosis not present

## 2017-07-23 DIAGNOSIS — D509 Iron deficiency anemia, unspecified: Secondary | ICD-10-CM | POA: Diagnosis not present

## 2017-07-23 DIAGNOSIS — N186 End stage renal disease: Secondary | ICD-10-CM | POA: Diagnosis not present

## 2017-07-23 DIAGNOSIS — D631 Anemia in chronic kidney disease: Secondary | ICD-10-CM | POA: Diagnosis not present

## 2017-07-23 DIAGNOSIS — Z992 Dependence on renal dialysis: Secondary | ICD-10-CM | POA: Diagnosis not present

## 2017-07-26 DIAGNOSIS — N186 End stage renal disease: Secondary | ICD-10-CM | POA: Diagnosis not present

## 2017-07-26 DIAGNOSIS — N2581 Secondary hyperparathyroidism of renal origin: Secondary | ICD-10-CM | POA: Diagnosis not present

## 2017-07-26 DIAGNOSIS — D509 Iron deficiency anemia, unspecified: Secondary | ICD-10-CM | POA: Diagnosis not present

## 2017-07-28 DIAGNOSIS — D509 Iron deficiency anemia, unspecified: Secondary | ICD-10-CM | POA: Diagnosis not present

## 2017-07-28 DIAGNOSIS — N2581 Secondary hyperparathyroidism of renal origin: Secondary | ICD-10-CM | POA: Diagnosis not present

## 2017-07-28 DIAGNOSIS — N186 End stage renal disease: Secondary | ICD-10-CM | POA: Diagnosis not present

## 2017-07-30 DIAGNOSIS — D509 Iron deficiency anemia, unspecified: Secondary | ICD-10-CM | POA: Diagnosis not present

## 2017-07-30 DIAGNOSIS — N186 End stage renal disease: Secondary | ICD-10-CM | POA: Diagnosis not present

## 2017-07-30 DIAGNOSIS — N2581 Secondary hyperparathyroidism of renal origin: Secondary | ICD-10-CM | POA: Diagnosis not present

## 2017-08-02 DIAGNOSIS — N186 End stage renal disease: Secondary | ICD-10-CM | POA: Diagnosis not present

## 2017-08-02 DIAGNOSIS — N2581 Secondary hyperparathyroidism of renal origin: Secondary | ICD-10-CM | POA: Diagnosis not present

## 2017-08-02 DIAGNOSIS — D509 Iron deficiency anemia, unspecified: Secondary | ICD-10-CM | POA: Diagnosis not present

## 2017-08-04 DIAGNOSIS — N2581 Secondary hyperparathyroidism of renal origin: Secondary | ICD-10-CM | POA: Diagnosis not present

## 2017-08-04 DIAGNOSIS — N186 End stage renal disease: Secondary | ICD-10-CM | POA: Diagnosis not present

## 2017-08-04 DIAGNOSIS — D509 Iron deficiency anemia, unspecified: Secondary | ICD-10-CM | POA: Diagnosis not present

## 2017-08-06 DIAGNOSIS — N2581 Secondary hyperparathyroidism of renal origin: Secondary | ICD-10-CM | POA: Diagnosis not present

## 2017-08-06 DIAGNOSIS — D509 Iron deficiency anemia, unspecified: Secondary | ICD-10-CM | POA: Diagnosis not present

## 2017-08-06 DIAGNOSIS — N186 End stage renal disease: Secondary | ICD-10-CM | POA: Diagnosis not present

## 2017-08-09 DIAGNOSIS — D509 Iron deficiency anemia, unspecified: Secondary | ICD-10-CM | POA: Diagnosis not present

## 2017-08-09 DIAGNOSIS — N186 End stage renal disease: Secondary | ICD-10-CM | POA: Diagnosis not present

## 2017-08-09 DIAGNOSIS — N2581 Secondary hyperparathyroidism of renal origin: Secondary | ICD-10-CM | POA: Diagnosis not present

## 2017-08-11 DIAGNOSIS — N186 End stage renal disease: Secondary | ICD-10-CM | POA: Diagnosis not present

## 2017-08-11 DIAGNOSIS — N2581 Secondary hyperparathyroidism of renal origin: Secondary | ICD-10-CM | POA: Diagnosis not present

## 2017-08-11 DIAGNOSIS — D509 Iron deficiency anemia, unspecified: Secondary | ICD-10-CM | POA: Diagnosis not present

## 2017-08-13 DIAGNOSIS — N2581 Secondary hyperparathyroidism of renal origin: Secondary | ICD-10-CM | POA: Diagnosis not present

## 2017-08-13 DIAGNOSIS — D509 Iron deficiency anemia, unspecified: Secondary | ICD-10-CM | POA: Diagnosis not present

## 2017-08-13 DIAGNOSIS — N186 End stage renal disease: Secondary | ICD-10-CM | POA: Diagnosis not present

## 2017-08-15 DIAGNOSIS — N2581 Secondary hyperparathyroidism of renal origin: Secondary | ICD-10-CM | POA: Diagnosis not present

## 2017-08-15 DIAGNOSIS — D509 Iron deficiency anemia, unspecified: Secondary | ICD-10-CM | POA: Diagnosis not present

## 2017-08-15 DIAGNOSIS — N186 End stage renal disease: Secondary | ICD-10-CM | POA: Diagnosis not present

## 2017-08-16 ENCOUNTER — Encounter (HOSPITAL_COMMUNITY): Payer: Self-pay | Admitting: Emergency Medicine

## 2017-08-16 ENCOUNTER — Emergency Department (HOSPITAL_COMMUNITY)
Admission: EM | Admit: 2017-08-16 | Discharge: 2017-08-16 | Disposition: A | Discharge status: Home / Self Care | Payer: Medicare Other | Attending: Emergency Medicine | Admitting: Emergency Medicine

## 2017-08-16 ENCOUNTER — Other Ambulatory Visit: Payer: Self-pay

## 2017-08-16 DIAGNOSIS — I120 Hypertensive chronic kidney disease with stage 5 chronic kidney disease or end stage renal disease: Secondary | ICD-10-CM | POA: Insufficient documentation

## 2017-08-16 DIAGNOSIS — R040 Epistaxis: Secondary | ICD-10-CM

## 2017-08-16 DIAGNOSIS — Z992 Dependence on renal dialysis: Secondary | ICD-10-CM | POA: Insufficient documentation

## 2017-08-16 DIAGNOSIS — N186 End stage renal disease: Secondary | ICD-10-CM | POA: Insufficient documentation

## 2017-08-16 DIAGNOSIS — Z940 Kidney transplant status: Secondary | ICD-10-CM | POA: Insufficient documentation

## 2017-08-16 MED ORDER — OXYMETAZOLINE HCL 0.05 % NA SOLN
1.0000 | NASAL | Status: DC | PRN
  Administered 2017-08-16: 1 via NASAL
  Filled 2017-08-16: qty 15

## 2017-08-16 NOTE — ED Triage Notes (Signed)
Pt c/o left nostril bleeding onset this am   Pt st's nostril was very dry and she was "digging in it" with her finger.  Pt st's bleeding started after that.

## 2017-08-16 NOTE — Discharge Instructions (Signed)
Use Afrin spray as needed if nosebleed returns.  Use 1 spray in the left nostril and apply immediate pressure and lean forward for 10 minutes.  Do not stop applying pressure during this time.  If bleeding persists or you become lightheaded or short of breath, return to the ED immediately.  You may also apply petroleum jelly onto a Q-tip and gently apply this to the left side of the nose to moisturize the nose.  Return to the ED if any other concerning signs or symptoms develop.  Follow-up at your dialysis center on Wednesday as scheduled.

## 2017-08-16 NOTE — ED Provider Notes (Signed)
[place] EMERGENCY DEPARTMENT Provider Note   CSN: [id number] Arrival date & time: 08/16/17  1750     History   Chief Complaint Chief Complaint  Patient presents with  . Epistaxis    HPI Candace Griffin is a 41 y.o. female with history of chronic glomerulonephritis on dialysis Monday Wednesday Friday, GERD, HTN presents today with chief complaint acute onset, progressively improved left-sided epistaxis.  She states that earlier today she felt that her nose was dry and she scratched at the left side of her nasal septum with her fingernail.  She notes initial blood "gushing "of blood from the left nare with improvement after application of direct pressure and leaning forward.  She states that at this time the blood is a slow trickle.  She received dialysis yesterday (Sunday) which requires heparinization due to the upcoming Christmas holiday.  She denies shortness of breath, chest pain, fevers, chills, headaches, lightheadedness, or weakness.  The history is provided by the patient.    Past Medical History:  Diagnosis Date  . Chronic glomerulonephritis   . Dialysis patient ([place])   . End stage renal disease ([place])   . GERD (gastroesophageal reflux disease)   . History of renal transplant 2011-10-29  . Hypertension     Patient Active Problem List   Diagnosis Date Noted  . Pain in lower back 07/14/2017  . Encounter for routine gynecological examination 02/25/2017  . Essential hypertension   . Absolute anemia   . AKI (acute kidney injury) ([place]) 09/17/2014  . Acute renal failure ([place]) 09/17/2014  . Low grade squamous intraepithelial lesion (LGSIL) on cervical Pap smear 05/03/2014  . Pyelonephritis 11/02/2013  . Genital warts 07/31/2013  . Atypical squamous cell changes of undetermined significance (ASCUS) on cervical cytology with positive high risk human papilloma virus (HPV) 07/19/2013  . Vaginal discharge 03/01/2013  . Right ankle pain 12/13/2012  .  Hypertension 04/21/2012  . Well woman exam 04/21/2012  . History of renal transplant 01/07/2012  . Cerumen impaction 12/29/2011  . Bacterial vaginosis 04/29/2011  . GERD 10/10/2009  . ANEMIA 08/11/2007  . GENITAL HERPES, HX OF 08/11/2007  . End-stage renal disease needing dialysis ([place]) 10/21/2006    Past Surgical History:  Procedure Laterality Date  . COLPOSCOPY  2016  . diaylsis shunt Right    arm  . KIDNEY TRANSPLANT  10/29/2011   deceased donor kidney    OB History    Gravida Para Term Preterm AB Living   4 3 2 1 1 3    SAB TAB Ectopic Multiple Live Births       1           Home Medications    Prior to Admission medications   Medication Sig Start Date End Date Taking? Authorizing Provider  amLODipine (NORVASC) 10 MG tablet Take 10 mg by mouth every evening. 07/10/15   [provider]  butalbital-acetaminophen-caffeine (FIORICET, ESGIC) 50-325-40 MG tablet Take 1 tablet by mouth daily as needed for headache (on dialysis days for headaches). 10/27/16   Penumalli, Earlean Polka, MD  carbamide peroxide (DEBROX) 6.5 % otic solution Place 5 drops into the right ear 2 (two) times daily. 01/07/16   Frazier Richards, MD  cyclobenzaprine (FLEXERIL) 5 MG tablet Take 1 tablet (5 mg total) by mouth 3 (three) times daily as needed for muscle spasms. 07/14/17   Mikell, Jeani Sow, MD  Darbepoetin Alfa (ARANESP) 200 MCG/0.4ML SOSY injection Inject 0.4 mLs (200 mcg total) into the vein every Tuesday  with hemodialysis. 09/20/14   Virginia Crews, MD  labetalol (NORMODYNE) 300 MG tablet Take 300 mg by mouth 3 (three) times daily. 09/25/14   [provider]  multivitamin (RENA-VIT) TABS tablet Take 1 tablet by mouth at bedtime. 09/20/14   Virginia Crews, MD  sevelamer (RENAGEL) 800 MG tablet Take 3,200 mg by mouth 3 (three) times daily with meals.    [provider]    Family History Family History  Problem Relation Age of Onset  . Diabetes Father   . Diabetes  Paternal Aunt   . Diabetes Paternal Grandmother   . Alcohol abuse Paternal Grandmother   . Alcohol abuse Paternal Uncle   . Cancer Maternal Grandmother        breast  . Breast cancer Maternal Grandmother   . Alcohol abuse Paternal Grandfather     Social History Social History   Tobacco Use  . Smoking status: Never Smoker  . Smokeless tobacco: Never Used  Substance Use Topics  . Alcohol use: No  . Drug use: No     Allergies   Patient has no known allergies.   Review of Systems Review of Systems  Constitutional: Negative for chills, fatigue and fever.  HENT: Positive for nosebleeds.   Respiratory: Negative for shortness of breath.   Cardiovascular: Negative for chest pain.  Neurological: Negative for syncope, weakness and headaches.     Physical Exam Updated Vital Signs BP (!) 157/109 (BP Location: Left Arm)   Pulse 76   Temp 98.3 F (36.8 C) (Oral)   Resp 16   Ht 5\' 7"  (1.702 m)   Wt 64 kg (141 lb)   LMP 07/27/2017 (Exact Date)   SpO2 98%   BMI 22.08 kg/m   Physical Exam  Constitutional: She appears well-developed and well-nourished. No distress.  HENT:  Head: Normocephalic and atraumatic.  Right Ear: External ear normal.  Left Ear: External ear normal.  Mouth/Throat: Oropharynx is clear and moist.  Nasal septum is midline with no evidence of septal hematoma.  Left anterior superficial abrasion noted to the nasal septum.  No active bleeding noted.  No mucosal edema noted.  No blood in the posterior oropharynx.  Eyes: Conjunctivae and EOM are normal. Pupils are equal, round, and reactive to light. Right eye exhibits no discharge. Left eye exhibits no discharge.  Neck: Normal range of motion. Neck supple. No JVD present. No tracheal deviation present.  Cardiovascular: Normal rate.  Pulmonary/Chest: Effort normal.  Abdominal: She exhibits no distension.  Musculoskeletal: She exhibits no edema.  Neurological: She is alert.  Skin: No erythema.  Psychiatric:  She has a normal mood and affect. Her behavior is normal.  Nursing note and vitals reviewed.    ED Treatments / Results  Labs (all labs ordered are listed, but only abnormal results are displayed) Labs Reviewed - No data to display  EKG  EKG Interpretation None       Radiology No results found.  Procedures Procedures (including critical care time)  Medications Ordered in ED Medications  oxymetazoline (AFRIN) 0.05 % nasal spray 1 spray (not administered)     Initial Impression / Assessment and Plan / ED Course  I have reviewed the triage vital signs and the nursing notes.  Pertinent labs & imaging results that were available during my care of the patient were reviewed by me and considered in my medical decision making (see chart for details).     Patient with left anterior septal epistaxis secondary to digital trauma.  Afebrile, vital signs are at patient's baseline. She is nontoxic in appearance.  No evidence of a large amount of blood loss resulting in symptomatic anemia.  On my examination, there is no bleeding.  No septal hematoma.  Airway is patent.  She feels stable for discharge home.  Will discharge with Afrin to use as needed if epistaxis recurs.  Advised of proper technique and applying pressure and leaning forward if epistaxis returns.  She will follow-up for her dialysis in 2 days and will request a hemoglobin at that time, most recent hemoglobin was 11. I see no reason to obtain H&H emergently today given stable vitals and symptoms consistent with anemia requiring transfusion. Discussed indications for return to the ED. Pt verbalized understanding of and agreement with plan and is safe for discharge home at this time.  She has no complaints prior to discharge.  Final Clinical Impressions(s) / ED Diagnoses   Final diagnoses:  Left-sided epistaxis    ED Discharge Orders    None       Debroah Baller 08/16/17 2133    Drenda Freeze, MD 08/19/17  [phone]

## 2017-08-16 NOTE — ED Notes (Signed)
See EDP note for secondary assessment

## 2017-08-18 DIAGNOSIS — N2581 Secondary hyperparathyroidism of renal origin: Secondary | ICD-10-CM | POA: Diagnosis not present

## 2017-08-18 DIAGNOSIS — N186 End stage renal disease: Secondary | ICD-10-CM | POA: Diagnosis not present

## 2017-08-18 DIAGNOSIS — D509 Iron deficiency anemia, unspecified: Secondary | ICD-10-CM | POA: Diagnosis not present

## 2017-08-20 DIAGNOSIS — N186 End stage renal disease: Secondary | ICD-10-CM | POA: Diagnosis not present

## 2017-08-20 DIAGNOSIS — D509 Iron deficiency anemia, unspecified: Secondary | ICD-10-CM | POA: Diagnosis not present

## 2017-08-20 DIAGNOSIS — N2581 Secondary hyperparathyroidism of renal origin: Secondary | ICD-10-CM | POA: Diagnosis not present

## 2017-08-22 DIAGNOSIS — N186 End stage renal disease: Secondary | ICD-10-CM | POA: Diagnosis not present

## 2017-08-22 DIAGNOSIS — D509 Iron deficiency anemia, unspecified: Secondary | ICD-10-CM | POA: Diagnosis not present

## 2017-08-22 DIAGNOSIS — N2581 Secondary hyperparathyroidism of renal origin: Secondary | ICD-10-CM | POA: Diagnosis not present

## 2017-08-23 DIAGNOSIS — N186 End stage renal disease: Secondary | ICD-10-CM | POA: Diagnosis not present

## 2017-08-23 DIAGNOSIS — Z992 Dependence on renal dialysis: Secondary | ICD-10-CM | POA: Diagnosis not present

## 2017-08-23 DIAGNOSIS — I129 Hypertensive chronic kidney disease with stage 1 through stage 4 chronic kidney disease, or unspecified chronic kidney disease: Secondary | ICD-10-CM | POA: Diagnosis not present

## 2017-08-25 DIAGNOSIS — D631 Anemia in chronic kidney disease: Secondary | ICD-10-CM | POA: Diagnosis not present

## 2017-08-25 DIAGNOSIS — N2581 Secondary hyperparathyroidism of renal origin: Secondary | ICD-10-CM | POA: Diagnosis not present

## 2017-08-25 DIAGNOSIS — D509 Iron deficiency anemia, unspecified: Secondary | ICD-10-CM | POA: Diagnosis not present

## 2017-08-25 DIAGNOSIS — N186 End stage renal disease: Secondary | ICD-10-CM | POA: Diagnosis not present

## 2017-08-27 DIAGNOSIS — N2581 Secondary hyperparathyroidism of renal origin: Secondary | ICD-10-CM | POA: Diagnosis not present

## 2017-08-27 DIAGNOSIS — D631 Anemia in chronic kidney disease: Secondary | ICD-10-CM | POA: Diagnosis not present

## 2017-08-27 DIAGNOSIS — N186 End stage renal disease: Secondary | ICD-10-CM | POA: Diagnosis not present

## 2017-08-27 DIAGNOSIS — D509 Iron deficiency anemia, unspecified: Secondary | ICD-10-CM | POA: Diagnosis not present

## 2017-08-30 DIAGNOSIS — D509 Iron deficiency anemia, unspecified: Secondary | ICD-10-CM | POA: Diagnosis not present

## 2017-08-30 DIAGNOSIS — D631 Anemia in chronic kidney disease: Secondary | ICD-10-CM | POA: Diagnosis not present

## 2017-08-30 DIAGNOSIS — N2581 Secondary hyperparathyroidism of renal origin: Secondary | ICD-10-CM | POA: Diagnosis not present

## 2017-08-30 DIAGNOSIS — N186 End stage renal disease: Secondary | ICD-10-CM | POA: Diagnosis not present

## 2017-09-01 DIAGNOSIS — D631 Anemia in chronic kidney disease: Secondary | ICD-10-CM | POA: Diagnosis not present

## 2017-09-01 DIAGNOSIS — N186 End stage renal disease: Secondary | ICD-10-CM | POA: Diagnosis not present

## 2017-09-01 DIAGNOSIS — N2581 Secondary hyperparathyroidism of renal origin: Secondary | ICD-10-CM | POA: Diagnosis not present

## 2017-09-01 DIAGNOSIS — D509 Iron deficiency anemia, unspecified: Secondary | ICD-10-CM | POA: Diagnosis not present

## 2017-09-03 DIAGNOSIS — D509 Iron deficiency anemia, unspecified: Secondary | ICD-10-CM | POA: Diagnosis not present

## 2017-09-03 DIAGNOSIS — N186 End stage renal disease: Secondary | ICD-10-CM | POA: Diagnosis not present

## 2017-09-03 DIAGNOSIS — N2581 Secondary hyperparathyroidism of renal origin: Secondary | ICD-10-CM | POA: Diagnosis not present

## 2017-09-03 DIAGNOSIS — D631 Anemia in chronic kidney disease: Secondary | ICD-10-CM | POA: Diagnosis not present

## 2017-09-06 DIAGNOSIS — D509 Iron deficiency anemia, unspecified: Secondary | ICD-10-CM | POA: Diagnosis not present

## 2017-09-06 DIAGNOSIS — N186 End stage renal disease: Secondary | ICD-10-CM | POA: Diagnosis not present

## 2017-09-06 DIAGNOSIS — N2581 Secondary hyperparathyroidism of renal origin: Secondary | ICD-10-CM | POA: Diagnosis not present

## 2017-09-06 DIAGNOSIS — D631 Anemia in chronic kidney disease: Secondary | ICD-10-CM | POA: Diagnosis not present

## 2017-09-08 DIAGNOSIS — N2581 Secondary hyperparathyroidism of renal origin: Secondary | ICD-10-CM | POA: Diagnosis not present

## 2017-09-08 DIAGNOSIS — D631 Anemia in chronic kidney disease: Secondary | ICD-10-CM | POA: Diagnosis not present

## 2017-09-08 DIAGNOSIS — D509 Iron deficiency anemia, unspecified: Secondary | ICD-10-CM | POA: Diagnosis not present

## 2017-09-08 DIAGNOSIS — N186 End stage renal disease: Secondary | ICD-10-CM | POA: Diagnosis not present

## 2017-09-09 ENCOUNTER — Ambulatory Visit (HOSPITAL_COMMUNITY)
Admission: RE | Admit: 2017-09-09 | Discharge: 2017-09-09 | Disposition: A | Discharge status: Home / Self Care | Payer: Medicare Other | Source: Ambulatory Visit | Attending: Nephrology | Admitting: Nephrology

## 2017-09-09 DIAGNOSIS — D500 Iron deficiency anemia secondary to blood loss (chronic): Secondary | ICD-10-CM | POA: Diagnosis not present

## 2017-09-09 LAB — PREPARE RBC (CROSSMATCH)

## 2017-09-09 MED ORDER — SODIUM CHLORIDE 0.9 % IV SOLN: Freq: Once | INTRAVENOUS | Status: DC

## 2017-09-09 NOTE — Progress Notes (Signed)
PATIENT CARE CENTER NOTE  Diagnosis: Anemia    Provider: J. Deterding   Procedure: 1 unit PRBC   Note: Patient received infusion of 1 unit of blood. Patient tolerated infusion well. Discharge instructions given to patient. Patient alert, oriented and ambulatory at discharge.

## 2017-09-09 NOTE — Discharge Instructions (Signed)

## 2017-09-10 DIAGNOSIS — D509 Iron deficiency anemia, unspecified: Secondary | ICD-10-CM | POA: Diagnosis not present

## 2017-09-10 DIAGNOSIS — N186 End stage renal disease: Secondary | ICD-10-CM | POA: Diagnosis not present

## 2017-09-10 DIAGNOSIS — D631 Anemia in chronic kidney disease: Secondary | ICD-10-CM | POA: Diagnosis not present

## 2017-09-10 DIAGNOSIS — N2581 Secondary hyperparathyroidism of renal origin: Secondary | ICD-10-CM | POA: Diagnosis not present

## 2017-09-13 DIAGNOSIS — D631 Anemia in chronic kidney disease: Secondary | ICD-10-CM | POA: Diagnosis not present

## 2017-09-13 DIAGNOSIS — N186 End stage renal disease: Secondary | ICD-10-CM | POA: Diagnosis not present

## 2017-09-13 DIAGNOSIS — N2581 Secondary hyperparathyroidism of renal origin: Secondary | ICD-10-CM | POA: Diagnosis not present

## 2017-09-13 DIAGNOSIS — D509 Iron deficiency anemia, unspecified: Secondary | ICD-10-CM | POA: Diagnosis not present

## 2017-09-13 LAB — TYPE AND SCREEN
ABO/RH(D): O POS
Antibody Screen: POSITIVE
DAT, IgG: POSITIVE
Donor AG Type: NEGATIVE
Donor AG Type: NEGATIVE
PT AG Type: NEGATIVE
Unit division: 0
Unit division: 0

## 2017-09-13 LAB — BPAM RBC
Blood Product Expiration Date: 201902062359
Blood Product Expiration Date: 201902162359
ISSUE DATE / TIME: 201901171333
ISSUE DATE / TIME: 201901171333
Unit Type and Rh: 5100
Unit Type and Rh: 5100

## 2017-09-15 DIAGNOSIS — D509 Iron deficiency anemia, unspecified: Secondary | ICD-10-CM | POA: Diagnosis not present

## 2017-09-15 DIAGNOSIS — N186 End stage renal disease: Secondary | ICD-10-CM | POA: Diagnosis not present

## 2017-09-15 DIAGNOSIS — D631 Anemia in chronic kidney disease: Secondary | ICD-10-CM | POA: Diagnosis not present

## 2017-09-15 DIAGNOSIS — N2581 Secondary hyperparathyroidism of renal origin: Secondary | ICD-10-CM | POA: Diagnosis not present

## 2017-09-17 DIAGNOSIS — N2581 Secondary hyperparathyroidism of renal origin: Secondary | ICD-10-CM | POA: Diagnosis not present

## 2017-09-17 DIAGNOSIS — D509 Iron deficiency anemia, unspecified: Secondary | ICD-10-CM | POA: Diagnosis not present

## 2017-09-17 DIAGNOSIS — N186 End stage renal disease: Secondary | ICD-10-CM | POA: Diagnosis not present

## 2017-09-17 DIAGNOSIS — D631 Anemia in chronic kidney disease: Secondary | ICD-10-CM | POA: Diagnosis not present

## 2017-09-21 DIAGNOSIS — N2581 Secondary hyperparathyroidism of renal origin: Secondary | ICD-10-CM | POA: Diagnosis not present

## 2017-09-21 DIAGNOSIS — D509 Iron deficiency anemia, unspecified: Secondary | ICD-10-CM | POA: Diagnosis not present

## 2017-09-21 DIAGNOSIS — D631 Anemia in chronic kidney disease: Secondary | ICD-10-CM | POA: Diagnosis not present

## 2017-09-21 DIAGNOSIS — N186 End stage renal disease: Secondary | ICD-10-CM | POA: Diagnosis not present

## 2017-09-22 DIAGNOSIS — D509 Iron deficiency anemia, unspecified: Secondary | ICD-10-CM | POA: Diagnosis not present

## 2017-09-22 DIAGNOSIS — N2581 Secondary hyperparathyroidism of renal origin: Secondary | ICD-10-CM | POA: Diagnosis not present

## 2017-09-22 DIAGNOSIS — N186 End stage renal disease: Secondary | ICD-10-CM | POA: Diagnosis not present

## 2017-09-22 DIAGNOSIS — D631 Anemia in chronic kidney disease: Secondary | ICD-10-CM | POA: Diagnosis not present

## 2017-09-23 DIAGNOSIS — I129 Hypertensive chronic kidney disease with stage 1 through stage 4 chronic kidney disease, or unspecified chronic kidney disease: Secondary | ICD-10-CM | POA: Diagnosis not present

## 2017-09-23 DIAGNOSIS — Z992 Dependence on renal dialysis: Secondary | ICD-10-CM | POA: Diagnosis not present

## 2017-09-23 DIAGNOSIS — N186 End stage renal disease: Secondary | ICD-10-CM | POA: Diagnosis not present

## 2017-09-24 DIAGNOSIS — Z992 Dependence on renal dialysis: Secondary | ICD-10-CM | POA: Diagnosis not present

## 2017-09-24 DIAGNOSIS — D509 Iron deficiency anemia, unspecified: Secondary | ICD-10-CM | POA: Diagnosis not present

## 2017-09-24 DIAGNOSIS — I129 Hypertensive chronic kidney disease with stage 1 through stage 4 chronic kidney disease, or unspecified chronic kidney disease: Secondary | ICD-10-CM | POA: Diagnosis not present

## 2017-09-24 DIAGNOSIS — N2581 Secondary hyperparathyroidism of renal origin: Secondary | ICD-10-CM | POA: Diagnosis not present

## 2017-09-24 DIAGNOSIS — D631 Anemia in chronic kidney disease: Secondary | ICD-10-CM | POA: Diagnosis not present

## 2017-09-24 DIAGNOSIS — I159 Secondary hypertension, unspecified: Secondary | ICD-10-CM | POA: Diagnosis not present

## 2017-09-24 DIAGNOSIS — N186 End stage renal disease: Secondary | ICD-10-CM | POA: Diagnosis not present

## 2017-09-27 DIAGNOSIS — N186 End stage renal disease: Secondary | ICD-10-CM | POA: Diagnosis not present

## 2017-09-27 DIAGNOSIS — I159 Secondary hypertension, unspecified: Secondary | ICD-10-CM | POA: Diagnosis not present

## 2017-09-27 DIAGNOSIS — N2581 Secondary hyperparathyroidism of renal origin: Secondary | ICD-10-CM | POA: Diagnosis not present

## 2017-09-27 DIAGNOSIS — D509 Iron deficiency anemia, unspecified: Secondary | ICD-10-CM | POA: Diagnosis not present

## 2017-09-27 DIAGNOSIS — D631 Anemia in chronic kidney disease: Secondary | ICD-10-CM | POA: Diagnosis not present

## 2017-09-29 DIAGNOSIS — N186 End stage renal disease: Secondary | ICD-10-CM | POA: Diagnosis not present

## 2017-09-29 DIAGNOSIS — D631 Anemia in chronic kidney disease: Secondary | ICD-10-CM | POA: Diagnosis not present

## 2017-09-29 DIAGNOSIS — N2581 Secondary hyperparathyroidism of renal origin: Secondary | ICD-10-CM | POA: Diagnosis not present

## 2017-09-29 DIAGNOSIS — D509 Iron deficiency anemia, unspecified: Secondary | ICD-10-CM | POA: Diagnosis not present

## 2017-09-29 DIAGNOSIS — I159 Secondary hypertension, unspecified: Secondary | ICD-10-CM | POA: Diagnosis not present

## 2017-10-01 DIAGNOSIS — D631 Anemia in chronic kidney disease: Secondary | ICD-10-CM | POA: Diagnosis not present

## 2017-10-01 DIAGNOSIS — I159 Secondary hypertension, unspecified: Secondary | ICD-10-CM | POA: Diagnosis not present

## 2017-10-01 DIAGNOSIS — N186 End stage renal disease: Secondary | ICD-10-CM | POA: Diagnosis not present

## 2017-10-01 DIAGNOSIS — D509 Iron deficiency anemia, unspecified: Secondary | ICD-10-CM | POA: Diagnosis not present

## 2017-10-01 DIAGNOSIS — N2581 Secondary hyperparathyroidism of renal origin: Secondary | ICD-10-CM | POA: Diagnosis not present

## 2017-10-04 DIAGNOSIS — N186 End stage renal disease: Secondary | ICD-10-CM | POA: Diagnosis not present

## 2017-10-04 DIAGNOSIS — N2581 Secondary hyperparathyroidism of renal origin: Secondary | ICD-10-CM | POA: Diagnosis not present

## 2017-10-04 DIAGNOSIS — I159 Secondary hypertension, unspecified: Secondary | ICD-10-CM | POA: Diagnosis not present

## 2017-10-04 DIAGNOSIS — D631 Anemia in chronic kidney disease: Secondary | ICD-10-CM | POA: Diagnosis not present

## 2017-10-04 DIAGNOSIS — D509 Iron deficiency anemia, unspecified: Secondary | ICD-10-CM | POA: Diagnosis not present

## 2017-10-06 DIAGNOSIS — N186 End stage renal disease: Secondary | ICD-10-CM | POA: Diagnosis not present

## 2017-10-06 DIAGNOSIS — D509 Iron deficiency anemia, unspecified: Secondary | ICD-10-CM | POA: Diagnosis not present

## 2017-10-06 DIAGNOSIS — N2581 Secondary hyperparathyroidism of renal origin: Secondary | ICD-10-CM | POA: Diagnosis not present

## 2017-10-06 DIAGNOSIS — I159 Secondary hypertension, unspecified: Secondary | ICD-10-CM | POA: Diagnosis not present

## 2017-10-06 DIAGNOSIS — D631 Anemia in chronic kidney disease: Secondary | ICD-10-CM | POA: Diagnosis not present

## 2017-10-08 DIAGNOSIS — D631 Anemia in chronic kidney disease: Secondary | ICD-10-CM | POA: Diagnosis not present

## 2017-10-08 DIAGNOSIS — I159 Secondary hypertension, unspecified: Secondary | ICD-10-CM | POA: Diagnosis not present

## 2017-10-08 DIAGNOSIS — N2581 Secondary hyperparathyroidism of renal origin: Secondary | ICD-10-CM | POA: Diagnosis not present

## 2017-10-08 DIAGNOSIS — N186 End stage renal disease: Secondary | ICD-10-CM | POA: Diagnosis not present

## 2017-10-08 DIAGNOSIS — D509 Iron deficiency anemia, unspecified: Secondary | ICD-10-CM | POA: Diagnosis not present

## 2017-10-11 DIAGNOSIS — N2581 Secondary hyperparathyroidism of renal origin: Secondary | ICD-10-CM | POA: Diagnosis not present

## 2017-10-11 DIAGNOSIS — N186 End stage renal disease: Secondary | ICD-10-CM | POA: Diagnosis not present

## 2017-10-11 DIAGNOSIS — D631 Anemia in chronic kidney disease: Secondary | ICD-10-CM | POA: Diagnosis not present

## 2017-10-11 DIAGNOSIS — I159 Secondary hypertension, unspecified: Secondary | ICD-10-CM | POA: Diagnosis not present

## 2017-10-11 DIAGNOSIS — D509 Iron deficiency anemia, unspecified: Secondary | ICD-10-CM | POA: Diagnosis not present

## 2017-10-13 ENCOUNTER — Ambulatory Visit (INDEPENDENT_AMBULATORY_CARE_PROVIDER_SITE_OTHER): Payer: Medicare Other | Admitting: Family Medicine

## 2017-10-13 ENCOUNTER — Encounter: Payer: Self-pay | Admitting: Family Medicine

## 2017-10-13 ENCOUNTER — Other Ambulatory Visit: Payer: Self-pay

## 2017-10-13 VITALS — BP 132/78 | HR 72 | Temp 98.0°F | Ht 67.0 in | Wt 136.4 lb

## 2017-10-13 DIAGNOSIS — D509 Iron deficiency anemia, unspecified: Secondary | ICD-10-CM | POA: Diagnosis not present

## 2017-10-13 DIAGNOSIS — I159 Secondary hypertension, unspecified: Secondary | ICD-10-CM | POA: Diagnosis not present

## 2017-10-13 DIAGNOSIS — N898 Other specified noninflammatory disorders of vagina: Secondary | ICD-10-CM

## 2017-10-13 DIAGNOSIS — N186 End stage renal disease: Secondary | ICD-10-CM | POA: Diagnosis not present

## 2017-10-13 DIAGNOSIS — N2581 Secondary hyperparathyroidism of renal origin: Secondary | ICD-10-CM | POA: Diagnosis not present

## 2017-10-13 DIAGNOSIS — D631 Anemia in chronic kidney disease: Secondary | ICD-10-CM | POA: Diagnosis not present

## 2017-10-13 LAB — POCT WET PREP (WET MOUNT)
Clue Cells Wet Prep Whiff POC: NEGATIVE
Trichomonas Wet Prep HPF POC: ABSENT

## 2017-10-13 MED ORDER — FLUCONAZOLE 150 MG PO TABS: 150.0000 mg | ORAL_TABLET | Freq: Once | ORAL | 6 refills | Status: AC

## 2017-10-13 NOTE — Progress Notes (Signed)
wet 

## 2017-10-13 NOTE — Progress Notes (Signed)
Date of Visit: 10/13/2017   HPI: Ms. Genter is a 42 year old female with PMH of HTN, end stage renal disease requiring dialysis, and recurrent vaginal yeast infections presenting today with vaginal discharge and itching.  Patient says that she was treated with one dose of diflucan for a yeast infection one month ago which she feels did not entirely eliminate the problem. She says that for her yeast infections she typically requires two doses of diflucan. She says that she has continued to have vaginal itching, red irritated skin, thick white vaginal discharge. She denies any new sexual contacts or risks for STIs and says she has not had sexual relations in the last three years. She denies fever, dysuria, flank pain, or new lesions.   ROS: See HPI.  [place]: Unchanged since last visit.   PHYSICAL EXAM: BP 132/78   Pulse 72   Temp 98 F (36.7 C) (Oral)   Ht 5\' 7"  (1.702 m)   Wt 136 lb 6.4 oz (61.9 kg)   SpO2 99%   BMI 21.36 kg/m  Gen: Well appearing female, NAD. HEENT: Normocephalic, no cervical lymphadenopathy. Heart: RRR, no murmurs, rubs, or gallops. Lungs: CTAB, no wheezes or crackles. Neuro: No focal deficits, speech normal.  Ext: No edema or rashes GU: Normal appearing vaginal vault and cervix. Thick white discharge present in vault and near cervix. No rashes or lesions present.   ASSESSMENT/PLAN:  Ms. Charlie is a 42 year old female presenting with vaginal itching and discharge consistent with yeast infection.  #Yeast Infection: - Although no yeast were visualized on wet prep, symptoms and exam consistent with yeast infection, will presumptively treat with diflucan 2 doses.   Health maintenance:  -UTD on everything except TDAP.   Dot Lanes, MS3

## 2017-10-13 NOTE — Patient Instructions (Signed)
Even though we did not see a yeast infection on the specimen, we will treat you as if you have it.

## 2017-10-14 ENCOUNTER — Encounter: Payer: Self-pay | Admitting: Family Medicine

## 2017-10-14 NOTE — Assessment & Plan Note (Signed)
Will treat as recurrent yeast vaginitis even though the wet prep did not confirm.

## 2017-10-15 DIAGNOSIS — D631 Anemia in chronic kidney disease: Secondary | ICD-10-CM | POA: Diagnosis not present

## 2017-10-15 DIAGNOSIS — D509 Iron deficiency anemia, unspecified: Secondary | ICD-10-CM | POA: Diagnosis not present

## 2017-10-15 DIAGNOSIS — I159 Secondary hypertension, unspecified: Secondary | ICD-10-CM | POA: Diagnosis not present

## 2017-10-15 DIAGNOSIS — N186 End stage renal disease: Secondary | ICD-10-CM | POA: Diagnosis not present

## 2017-10-15 DIAGNOSIS — N2581 Secondary hyperparathyroidism of renal origin: Secondary | ICD-10-CM | POA: Diagnosis not present

## 2017-10-18 DIAGNOSIS — N2581 Secondary hyperparathyroidism of renal origin: Secondary | ICD-10-CM | POA: Diagnosis not present

## 2017-10-18 DIAGNOSIS — D631 Anemia in chronic kidney disease: Secondary | ICD-10-CM | POA: Diagnosis not present

## 2017-10-18 DIAGNOSIS — N186 End stage renal disease: Secondary | ICD-10-CM | POA: Diagnosis not present

## 2017-10-18 DIAGNOSIS — I159 Secondary hypertension, unspecified: Secondary | ICD-10-CM | POA: Diagnosis not present

## 2017-10-18 DIAGNOSIS — D509 Iron deficiency anemia, unspecified: Secondary | ICD-10-CM | POA: Diagnosis not present

## 2017-10-20 DIAGNOSIS — N186 End stage renal disease: Secondary | ICD-10-CM | POA: Diagnosis not present

## 2017-10-20 DIAGNOSIS — D631 Anemia in chronic kidney disease: Secondary | ICD-10-CM | POA: Diagnosis not present

## 2017-10-20 DIAGNOSIS — I159 Secondary hypertension, unspecified: Secondary | ICD-10-CM | POA: Diagnosis not present

## 2017-10-20 DIAGNOSIS — N2581 Secondary hyperparathyroidism of renal origin: Secondary | ICD-10-CM | POA: Diagnosis not present

## 2017-10-20 DIAGNOSIS — D509 Iron deficiency anemia, unspecified: Secondary | ICD-10-CM | POA: Diagnosis not present

## 2017-10-22 DIAGNOSIS — D509 Iron deficiency anemia, unspecified: Secondary | ICD-10-CM | POA: Diagnosis not present

## 2017-10-22 DIAGNOSIS — I129 Hypertensive chronic kidney disease with stage 1 through stage 4 chronic kidney disease, or unspecified chronic kidney disease: Secondary | ICD-10-CM | POA: Diagnosis not present

## 2017-10-22 DIAGNOSIS — Z992 Dependence on renal dialysis: Secondary | ICD-10-CM | POA: Diagnosis not present

## 2017-10-22 DIAGNOSIS — N186 End stage renal disease: Secondary | ICD-10-CM | POA: Diagnosis not present

## 2017-10-22 DIAGNOSIS — N2581 Secondary hyperparathyroidism of renal origin: Secondary | ICD-10-CM | POA: Diagnosis not present

## 2017-10-22 DIAGNOSIS — I159 Secondary hypertension, unspecified: Secondary | ICD-10-CM | POA: Diagnosis not present

## 2017-10-25 DIAGNOSIS — N186 End stage renal disease: Secondary | ICD-10-CM | POA: Diagnosis not present

## 2017-10-25 DIAGNOSIS — D509 Iron deficiency anemia, unspecified: Secondary | ICD-10-CM | POA: Diagnosis not present

## 2017-10-25 DIAGNOSIS — N2581 Secondary hyperparathyroidism of renal origin: Secondary | ICD-10-CM | POA: Diagnosis not present

## 2017-10-25 DIAGNOSIS — I159 Secondary hypertension, unspecified: Secondary | ICD-10-CM | POA: Diagnosis not present

## 2017-10-27 DIAGNOSIS — N2581 Secondary hyperparathyroidism of renal origin: Secondary | ICD-10-CM | POA: Diagnosis not present

## 2017-10-27 DIAGNOSIS — D509 Iron deficiency anemia, unspecified: Secondary | ICD-10-CM | POA: Diagnosis not present

## 2017-10-27 DIAGNOSIS — N186 End stage renal disease: Secondary | ICD-10-CM | POA: Diagnosis not present

## 2017-10-27 DIAGNOSIS — I159 Secondary hypertension, unspecified: Secondary | ICD-10-CM | POA: Diagnosis not present

## 2017-10-29 DIAGNOSIS — N2581 Secondary hyperparathyroidism of renal origin: Secondary | ICD-10-CM | POA: Diagnosis not present

## 2017-10-29 DIAGNOSIS — I159 Secondary hypertension, unspecified: Secondary | ICD-10-CM | POA: Diagnosis not present

## 2017-10-29 DIAGNOSIS — N186 End stage renal disease: Secondary | ICD-10-CM | POA: Diagnosis not present

## 2017-10-29 DIAGNOSIS — D509 Iron deficiency anemia, unspecified: Secondary | ICD-10-CM | POA: Diagnosis not present

## 2017-11-01 DIAGNOSIS — N186 End stage renal disease: Secondary | ICD-10-CM | POA: Diagnosis not present

## 2017-11-01 DIAGNOSIS — N2581 Secondary hyperparathyroidism of renal origin: Secondary | ICD-10-CM | POA: Diagnosis not present

## 2017-11-01 DIAGNOSIS — D509 Iron deficiency anemia, unspecified: Secondary | ICD-10-CM | POA: Diagnosis not present

## 2017-11-01 DIAGNOSIS — I159 Secondary hypertension, unspecified: Secondary | ICD-10-CM | POA: Diagnosis not present

## 2017-11-03 DIAGNOSIS — I159 Secondary hypertension, unspecified: Secondary | ICD-10-CM | POA: Diagnosis not present

## 2017-11-03 DIAGNOSIS — D509 Iron deficiency anemia, unspecified: Secondary | ICD-10-CM | POA: Diagnosis not present

## 2017-11-03 DIAGNOSIS — N2581 Secondary hyperparathyroidism of renal origin: Secondary | ICD-10-CM | POA: Diagnosis not present

## 2017-11-03 DIAGNOSIS — N186 End stage renal disease: Secondary | ICD-10-CM | POA: Diagnosis not present

## 2017-11-05 DIAGNOSIS — N186 End stage renal disease: Secondary | ICD-10-CM | POA: Diagnosis not present

## 2017-11-05 DIAGNOSIS — D509 Iron deficiency anemia, unspecified: Secondary | ICD-10-CM | POA: Diagnosis not present

## 2017-11-05 DIAGNOSIS — I159 Secondary hypertension, unspecified: Secondary | ICD-10-CM | POA: Diagnosis not present

## 2017-11-05 DIAGNOSIS — N2581 Secondary hyperparathyroidism of renal origin: Secondary | ICD-10-CM | POA: Diagnosis not present

## 2017-11-08 DIAGNOSIS — N2581 Secondary hyperparathyroidism of renal origin: Secondary | ICD-10-CM | POA: Diagnosis not present

## 2017-11-08 DIAGNOSIS — I159 Secondary hypertension, unspecified: Secondary | ICD-10-CM | POA: Diagnosis not present

## 2017-11-08 DIAGNOSIS — N186 End stage renal disease: Secondary | ICD-10-CM | POA: Diagnosis not present

## 2017-11-08 DIAGNOSIS — D509 Iron deficiency anemia, unspecified: Secondary | ICD-10-CM | POA: Diagnosis not present

## 2017-11-10 DIAGNOSIS — I159 Secondary hypertension, unspecified: Secondary | ICD-10-CM | POA: Diagnosis not present

## 2017-11-10 DIAGNOSIS — N2581 Secondary hyperparathyroidism of renal origin: Secondary | ICD-10-CM | POA: Diagnosis not present

## 2017-11-10 DIAGNOSIS — N186 End stage renal disease: Secondary | ICD-10-CM | POA: Diagnosis not present

## 2017-11-10 DIAGNOSIS — D509 Iron deficiency anemia, unspecified: Secondary | ICD-10-CM | POA: Diagnosis not present

## 2017-11-12 DIAGNOSIS — N2581 Secondary hyperparathyroidism of renal origin: Secondary | ICD-10-CM | POA: Diagnosis not present

## 2017-11-12 DIAGNOSIS — D509 Iron deficiency anemia, unspecified: Secondary | ICD-10-CM | POA: Diagnosis not present

## 2017-11-12 DIAGNOSIS — N186 End stage renal disease: Secondary | ICD-10-CM | POA: Diagnosis not present

## 2017-11-12 DIAGNOSIS — I159 Secondary hypertension, unspecified: Secondary | ICD-10-CM | POA: Diagnosis not present

## 2017-11-15 DIAGNOSIS — D509 Iron deficiency anemia, unspecified: Secondary | ICD-10-CM | POA: Diagnosis not present

## 2017-11-15 DIAGNOSIS — N186 End stage renal disease: Secondary | ICD-10-CM | POA: Diagnosis not present

## 2017-11-15 DIAGNOSIS — I159 Secondary hypertension, unspecified: Secondary | ICD-10-CM | POA: Diagnosis not present

## 2017-11-15 DIAGNOSIS — N2581 Secondary hyperparathyroidism of renal origin: Secondary | ICD-10-CM | POA: Diagnosis not present

## 2017-11-16 ENCOUNTER — Other Ambulatory Visit: Payer: Self-pay

## 2017-11-16 ENCOUNTER — Encounter: Payer: Self-pay | Admitting: Internal Medicine

## 2017-11-16 ENCOUNTER — Ambulatory Visit (INDEPENDENT_AMBULATORY_CARE_PROVIDER_SITE_OTHER): Payer: Medicare Other | Admitting: Internal Medicine

## 2017-11-16 VITALS — BP 100/68 | HR 76 | Temp 98.0°F | Ht 67.0 in | Wt 138.2 lb

## 2017-11-16 DIAGNOSIS — R1032 Left lower quadrant pain: Secondary | ICD-10-CM | POA: Diagnosis present

## 2017-11-16 NOTE — Progress Notes (Signed)
[facility] Candace Mo, MD Phone: [phone]  Reason For Visit: SDA for Lower back pain   #Patient presents to clinic with a left lower back and abdominal pain.  Patient states that she has been having pain on and off like this since for several weeks. Discussed this previously with her nephrologist who thought that she had low back pain and would probably benefit from a muscle relaxer. She states this weekend the pain was worse than previously.  She indicates that the pain was in her lower back this weekend and radiated to her left lower side. Has not had a bowel movement since Sunday, she states she has a history of constipation.  She indicates that possibly this weekend she had a little bit of a fever of 99.9.  She denies any nausea or vomiting associated with this pain.  She denies any abdominal appetite changes.  She recently had her menstrual period which ended on 9 March.  She has not been sexually active in over 3 years.  She thought maybe this was a yeast infection however she has been treated recently in February for a yeast infection.  She is extremely convinced that this is at kidney tract infection/UTI though she does not make any urine and has not made urine since October 2018 her kidney transplant failed. She has been on dialysis since that time.  He does states she has had some low back pain in the past from her menstrual periods, however she notes her last menstrual period she did not have any low back pain.    Past Medical History Reviewed problem list.  Medications- reviewed and updated No additions to family history Social history- patient is a non-smoker  Objective: BP 100/68   Pulse 76   Temp 98 F (36.7 C) (Oral)   Ht 5\' 7"  (1.702 m)   Wt 138 lb 3.2 oz (62.7 kg)   SpO2 97%   BMI 21.65 kg/m  Gen: NAD, alert, cooperative with exam Cardio: regular rate and rhythm, S1S2 heard, no murmurs appreciated Pulm: clear to auscultation bilaterally,  no wheezes, rhonchi or rales GI: soft, tender left lower quadrant tenderness, bowel sounds present, no hepatomegaly, no splenomegaly Skin: dry, intact, no rashes or lesions  Assessment/Plan: See problem based a/p  LLQ abdominal pain Presenting with left lower quadrant and low left lower back pain has been ongoing for the last several weeks recently worsening over the weekend now slightly improved.  Patient was extremely convinced that she had a urinary tract/kidney infection.  She refused any work-up unless she was treated for a urinary tract infection.  Because patient has not made the urine in over 6 months do not think there would be an opportunity for her to have a urinary tract/kidney infection. Discussed with patient that I could not just give her antibiotics without working her up and determining what was going on to provided treatment. She refused blood work or any imaging unless I was able to do them immediately today and provide the results back today.  Explained to her that I did not have this ability at this clinic to offer immediate results, offered she could go to ED if she wants results back immediately.  - Differential for patient's chronic lower back pain and LLQ abdominal pain - includes ovarian cyst, mittelschmerz syndrome, vs. Muscle spasms vs. Constipation vs. Less likely diverticulosis (given younger age)  - Discussed obtaining blood work and imaging  - Follow up if patient would like  to discuss care again

## 2017-11-16 NOTE — Patient Instructions (Signed)
If you change you mind about working up your pain, please come back in discuss next steps. You are more the welcome to make an appointment to get a second opinion. Please follow up as needed

## 2017-11-17 DIAGNOSIS — I159 Secondary hypertension, unspecified: Secondary | ICD-10-CM | POA: Diagnosis not present

## 2017-11-17 DIAGNOSIS — N186 End stage renal disease: Secondary | ICD-10-CM | POA: Diagnosis not present

## 2017-11-17 DIAGNOSIS — N2581 Secondary hyperparathyroidism of renal origin: Secondary | ICD-10-CM | POA: Diagnosis not present

## 2017-11-17 DIAGNOSIS — R1032 Left lower quadrant pain: Secondary | ICD-10-CM | POA: Insufficient documentation

## 2017-11-17 DIAGNOSIS — D509 Iron deficiency anemia, unspecified: Secondary | ICD-10-CM | POA: Diagnosis not present

## 2017-11-17 NOTE — Assessment & Plan Note (Addendum)
Presenting with left lower quadrant and low left lower back pain has been ongoing for the last several weeks recently worsening over the weekend now slightly improved.  Patient was extremely convinced that she had a urinary tract/kidney infection.  She refused any work-up unless she was treated for a urinary tract infection.  Because patient has not made the urine in over 6 months do not think there would be an opportunity for her to have a urinary tract/kidney infection. Discussed with patient that I could not just give her antibiotics without working her up and determining what was going on to provided treatment. She refused blood work or any imaging unless I was able to do them immediately today and provide the results back today.  Explained to her that I did not have this ability at this clinic to offer immediate results, offered she could go to ED if she wants results back immediately.  - Differential for patient's chronic lower back pain and LLQ abdominal pain - includes ovarian cyst, mittelschmerz syndrome, vs. Muscle spasms vs. Constipation vs. Less likely diverticulosis (given younger age)  - Discussed obtaining blood work and imaging  - Follow up if patient would like to discuss care again

## 2017-11-19 DIAGNOSIS — D509 Iron deficiency anemia, unspecified: Secondary | ICD-10-CM | POA: Diagnosis not present

## 2017-11-19 DIAGNOSIS — N2581 Secondary hyperparathyroidism of renal origin: Secondary | ICD-10-CM | POA: Diagnosis not present

## 2017-11-19 DIAGNOSIS — N186 End stage renal disease: Secondary | ICD-10-CM | POA: Diagnosis not present

## 2017-11-19 DIAGNOSIS — I159 Secondary hypertension, unspecified: Secondary | ICD-10-CM | POA: Diagnosis not present

## 2017-11-22 DIAGNOSIS — I129 Hypertensive chronic kidney disease with stage 1 through stage 4 chronic kidney disease, or unspecified chronic kidney disease: Secondary | ICD-10-CM | POA: Diagnosis not present

## 2017-11-22 DIAGNOSIS — D509 Iron deficiency anemia, unspecified: Secondary | ICD-10-CM | POA: Diagnosis not present

## 2017-11-22 DIAGNOSIS — N2581 Secondary hyperparathyroidism of renal origin: Secondary | ICD-10-CM | POA: Diagnosis not present

## 2017-11-22 DIAGNOSIS — Z992 Dependence on renal dialysis: Secondary | ICD-10-CM | POA: Diagnosis not present

## 2017-11-22 DIAGNOSIS — I159 Secondary hypertension, unspecified: Secondary | ICD-10-CM | POA: Diagnosis not present

## 2017-11-22 DIAGNOSIS — N924 Excessive bleeding in the premenopausal period: Secondary | ICD-10-CM | POA: Diagnosis not present

## 2017-11-22 DIAGNOSIS — N186 End stage renal disease: Secondary | ICD-10-CM | POA: Diagnosis not present

## 2017-11-22 DIAGNOSIS — D631 Anemia in chronic kidney disease: Secondary | ICD-10-CM | POA: Diagnosis not present

## 2017-11-24 DIAGNOSIS — D509 Iron deficiency anemia, unspecified: Secondary | ICD-10-CM | POA: Diagnosis not present

## 2017-11-24 DIAGNOSIS — N2581 Secondary hyperparathyroidism of renal origin: Secondary | ICD-10-CM | POA: Diagnosis not present

## 2017-11-24 DIAGNOSIS — D631 Anemia in chronic kidney disease: Secondary | ICD-10-CM | POA: Diagnosis not present

## 2017-11-24 DIAGNOSIS — I159 Secondary hypertension, unspecified: Secondary | ICD-10-CM | POA: Diagnosis not present

## 2017-11-24 DIAGNOSIS — N186 End stage renal disease: Secondary | ICD-10-CM | POA: Diagnosis not present

## 2017-11-26 DIAGNOSIS — N186 End stage renal disease: Secondary | ICD-10-CM | POA: Diagnosis not present

## 2017-11-26 DIAGNOSIS — I159 Secondary hypertension, unspecified: Secondary | ICD-10-CM | POA: Diagnosis not present

## 2017-11-26 DIAGNOSIS — D509 Iron deficiency anemia, unspecified: Secondary | ICD-10-CM | POA: Diagnosis not present

## 2017-11-26 DIAGNOSIS — D631 Anemia in chronic kidney disease: Secondary | ICD-10-CM | POA: Diagnosis not present

## 2017-11-26 DIAGNOSIS — N2581 Secondary hyperparathyroidism of renal origin: Secondary | ICD-10-CM | POA: Diagnosis not present

## 2017-11-29 DIAGNOSIS — D631 Anemia in chronic kidney disease: Secondary | ICD-10-CM | POA: Diagnosis not present

## 2017-11-29 DIAGNOSIS — D509 Iron deficiency anemia, unspecified: Secondary | ICD-10-CM | POA: Diagnosis not present

## 2017-11-29 DIAGNOSIS — N186 End stage renal disease: Secondary | ICD-10-CM | POA: Diagnosis not present

## 2017-11-29 DIAGNOSIS — N2581 Secondary hyperparathyroidism of renal origin: Secondary | ICD-10-CM | POA: Diagnosis not present

## 2017-11-29 DIAGNOSIS — I159 Secondary hypertension, unspecified: Secondary | ICD-10-CM | POA: Diagnosis not present

## 2017-12-01 DIAGNOSIS — N2581 Secondary hyperparathyroidism of renal origin: Secondary | ICD-10-CM | POA: Diagnosis not present

## 2017-12-01 DIAGNOSIS — I159 Secondary hypertension, unspecified: Secondary | ICD-10-CM | POA: Diagnosis not present

## 2017-12-01 DIAGNOSIS — D631 Anemia in chronic kidney disease: Secondary | ICD-10-CM | POA: Diagnosis not present

## 2017-12-01 DIAGNOSIS — N186 End stage renal disease: Secondary | ICD-10-CM | POA: Diagnosis not present

## 2017-12-01 DIAGNOSIS — D509 Iron deficiency anemia, unspecified: Secondary | ICD-10-CM | POA: Diagnosis not present

## 2017-12-03 ENCOUNTER — Observation Stay (HOSPITAL_COMMUNITY)
Admission: EM | Admit: 2017-12-03 | Discharge: 2017-12-04 | Disposition: A | Discharge status: Home / Self Care | Payer: Medicare Other | Attending: Family Medicine | Admitting: Family Medicine

## 2017-12-03 ENCOUNTER — Encounter (HOSPITAL_COMMUNITY): Payer: Self-pay | Admitting: Pharmacy Technician

## 2017-12-03 DIAGNOSIS — R51 Headache: Secondary | ICD-10-CM | POA: Insufficient documentation

## 2017-12-03 DIAGNOSIS — Z940 Kidney transplant status: Secondary | ICD-10-CM | POA: Diagnosis not present

## 2017-12-03 DIAGNOSIS — I120 Hypertensive chronic kidney disease with stage 5 chronic kidney disease or end stage renal disease: Secondary | ICD-10-CM | POA: Insufficient documentation

## 2017-12-03 DIAGNOSIS — N186 End stage renal disease: Secondary | ICD-10-CM | POA: Insufficient documentation

## 2017-12-03 DIAGNOSIS — Z992 Dependence on renal dialysis: Secondary | ICD-10-CM | POA: Diagnosis not present

## 2017-12-03 DIAGNOSIS — I159 Secondary hypertension, unspecified: Secondary | ICD-10-CM | POA: Diagnosis not present

## 2017-12-03 DIAGNOSIS — K219 Gastro-esophageal reflux disease without esophagitis: Secondary | ICD-10-CM | POA: Insufficient documentation

## 2017-12-03 DIAGNOSIS — R404 Transient alteration of awareness: Secondary | ICD-10-CM | POA: Diagnosis not present

## 2017-12-03 DIAGNOSIS — Z79899 Other long term (current) drug therapy: Secondary | ICD-10-CM | POA: Diagnosis not present

## 2017-12-03 DIAGNOSIS — D509 Iron deficiency anemia, unspecified: Secondary | ICD-10-CM | POA: Diagnosis not present

## 2017-12-03 DIAGNOSIS — R55 Syncope and collapse: Secondary | ICD-10-CM | POA: Diagnosis not present

## 2017-12-03 DIAGNOSIS — D649 Anemia, unspecified: Principal | ICD-10-CM | POA: Diagnosis present

## 2017-12-03 DIAGNOSIS — D631 Anemia in chronic kidney disease: Secondary | ICD-10-CM | POA: Diagnosis not present

## 2017-12-03 DIAGNOSIS — N2581 Secondary hyperparathyroidism of renal origin: Secondary | ICD-10-CM | POA: Diagnosis not present

## 2017-12-03 DIAGNOSIS — I10 Essential (primary) hypertension: Secondary | ICD-10-CM | POA: Diagnosis not present

## 2017-12-03 LAB — COMPREHENSIVE METABOLIC PANEL
ALT: 12 U/L — ABNORMAL LOW (ref 14–54)
AST: 20 U/L (ref 15–41)
Albumin: 3.6 g/dL (ref 3.5–5.0)
Alkaline Phosphatase: 67 U/L (ref 38–126)
Anion gap: 13 (ref 5–15)
BUN: 10 mg/dL (ref 6–20)
CO2: 30 mmol/L (ref 22–32)
Calcium: 7.7 mg/dL — ABNORMAL LOW (ref 8.9–10.3)
Chloride: 95 mmol/L — ABNORMAL LOW (ref 101–111)
Creatinine, Ser: 3.86 mg/dL — ABNORMAL HIGH (ref 0.44–1.00)
GFR calc Af Amer: 16 mL/min — ABNORMAL LOW (ref >60)
GFR calc non Af Amer: 13 mL/min — ABNORMAL LOW (ref >60)
Glucose, Bld: 71 mg/dL (ref 65–99)
Potassium: 3.3 mmol/L — ABNORMAL LOW (ref 3.5–5.1)
Sodium: 138 mmol/L (ref 135–145)
Total Bilirubin: 0.5 mg/dL (ref 0.3–1.2)
Total Protein: 6.9 g/dL (ref 6.5–8.1)

## 2017-12-03 LAB — IRON AND TIBC
Iron: 65 ug/dL (ref 28–170)
Saturation Ratios: 35 % — ABNORMAL HIGH (ref 10.4–31.8)
TIBC: 188 ug/dL — ABNORMAL LOW (ref 250–450)
UIBC: 123 ug/dL

## 2017-12-03 LAB — PROTIME-INR
INR: 1
Prothrombin Time: 13.1 seconds (ref 11.4–15.2)

## 2017-12-03 LAB — PREPARE RBC (CROSSMATCH)

## 2017-12-03 LAB — HCG, SERUM, QUALITATIVE: Preg, Serum: NEGATIVE

## 2017-12-03 LAB — MRSA PCR SCREENING: MRSA by PCR: NEGATIVE

## 2017-12-03 LAB — CBG MONITORING, ED: Glucose-Capillary: 65 mg/dL (ref 65–99)

## 2017-12-03 LAB — FERRITIN: Ferritin: 597 ng/mL — ABNORMAL HIGH (ref 11–307)

## 2017-12-03 MED ORDER — SODIUM CHLORIDE 0.9 % IV SOLN: Freq: Once | INTRAVENOUS | Status: DC

## 2017-12-03 MED ORDER — LABETALOL HCL 300 MG PO TABS
300.0000 mg | ORAL_TABLET | Freq: Three times a day (TID) | ORAL | Status: DC
Start: 2017-12-03 — End: 2017-12-04
  Administered 2017-12-03 – 2017-12-04 (×2): 300 mg via ORAL
  Filled 2017-12-03 (×3): qty 1

## 2017-12-03 MED ORDER — SEVELAMER CARBONATE 800 MG PO TABS
4000.0000 mg | ORAL_TABLET | Freq: Three times a day (TID) | ORAL | Status: DC
  Administered 2017-12-03 – 2017-12-04 (×3): 4000 mg via ORAL
  Filled 2017-12-03 (×3): qty 5

## 2017-12-03 MED ORDER — POLYETHYLENE GLYCOL 3350 17 G PO PACK: 17.0000 g | PACK | Freq: Every day | ORAL | Status: DC | PRN

## 2017-12-03 MED ORDER — HYDRALAZINE HCL 20 MG/ML IJ SOLN
5.0000 mg | INTRAMUSCULAR | Status: DC | PRN
  Administered 2017-12-03 – 2017-12-04 (×2): 5 mg via INTRAVENOUS
  Filled 2017-12-03 (×2): qty 1

## 2017-12-03 MED ORDER — PANTOPRAZOLE SODIUM 40 MG PO TBEC
40.0000 mg | DELAYED_RELEASE_TABLET | Freq: Every day | ORAL | Status: DC
  Administered 2017-12-03 – 2017-12-04 (×2): 40 mg via ORAL
  Filled 2017-12-03 (×2): qty 1

## 2017-12-03 MED ORDER — ACETAMINOPHEN 650 MG RE SUPP: 650.0000 mg | Freq: Four times a day (QID) | RECTAL | Status: DC | PRN

## 2017-12-03 MED ORDER — AMLODIPINE BESYLATE 5 MG PO TABS
10.0000 mg | ORAL_TABLET | Freq: Every evening | ORAL | Status: DC
  Administered 2017-12-03: 10 mg via ORAL
  Filled 2017-12-03: qty 2

## 2017-12-03 MED ORDER — ACETAMINOPHEN 325 MG PO TABS
650.0000 mg | ORAL_TABLET | Freq: Four times a day (QID) | ORAL | Status: DC | PRN
Start: 2017-12-03 — End: 2017-12-04

## 2017-12-03 MED ORDER — LABETALOL HCL 200 MG PO TABS
300.0000 mg | ORAL_TABLET | Freq: Once | ORAL | Status: AC
  Administered 2017-12-03: 300 mg via ORAL
  Filled 2017-12-03: qty 2

## 2017-12-03 MED ORDER — ACETAMINOPHEN 325 MG PO TABS
650.0000 mg | ORAL_TABLET | Freq: Once | ORAL | Status: AC
Start: 2017-12-03 — End: 2017-12-03
  Administered 2017-12-03: 650 mg via ORAL
  Filled 2017-12-03: qty 2

## 2017-12-03 MED ORDER — IRBESARTAN 150 MG PO TABS
150.0000 mg | ORAL_TABLET | Freq: Every evening | ORAL | Status: DC
  Administered 2017-12-03: 150 mg via ORAL
  Filled 2017-12-03 (×2): qty 1

## 2017-12-03 MED ORDER — SEVELAMER CARBONATE 800 MG PO TABS: 1600.0000 mg | ORAL_TABLET | ORAL | Status: DC | PRN

## 2017-12-03 NOTE — ED Notes (Signed)
+  antibody screen per blood bank

## 2017-12-03 NOTE — ED Notes (Signed)
Pt reports increased fatigue over the last few weeks. Hx of blood transfusions.

## 2017-12-03 NOTE — H&P (Addendum)
[facility] Admission History and Physical Service Pager: [phone]  Patient name: Candace Griffin Medical record number: [id number] Date of birth: 22-Oct-1975 Age: 42 y.o. Gender: female  Primary Care Provider: Tonette Bihari, MD Consultants: Nephrology Code Status: Full  Chief Complaint: symptomatic anemia  Assessment and Plan: Candace Griffin is a 42 y.o. female presenting with symptomatic anemia noticed at HD. PMH is significant for ESRD on HD (MWF) s/p renal transplant 2013, GERD, HSV2.  Symptomatic anemia Hb 6.3 noted at HD, 6.5 in ED.  Unknown baseline, last reported in 01/2016 was 10.0. However patient does appear to have had a blood transfusion in EMR as recently as 08/2017. Vitals otherwise stable. EKG received in ED difficult to interpret due to artifact, appears NSR but will repeat. Has received transfusions in the past, most recent 2 months ago. Likely due to menorrhagia given heavy periods the last 5 months and no other reported blood loss. GI loss less likely as patient denies melena or hematochezia and no abdominal pain. Consider evaluation for bleeding disorder such as Factor 5 Leiden, vWD given history of nosebleeds (ED visit 07/2017) and menorrhagia. Also can consider pelvic ultrasound to evaluate for fibroids or endometrial hyperplasia, could likely be done as an outpatient workup with GYN follow up.  - admit to telemetry, attending Dr. Ardelia Mems  - 1u pRBC ordered in ED, f/u post H&H - iron studies to be collected prior to blood transfusion, confirmed with nursing - am CBC, BMP - repeat EKG - preg test - FOBT - PT/INR - PT/OT - cardiac monitoring - continuous pulse ox - vitals per unit routine  ESRD on HD MWF, s/p Renal transplant  2/2 chronic glomerulonephritis, s/p renal transplant February 2013 at [facility]. Transplant failed in 2016 when she reinitiated HD. Hasn't made urine since October 2018 per chart review. Currently  being evaluated for repeat transplant. MWF HD. - Nephrology consulted, appreciate recs  Hypertension Hypertensive on admission to 163/100>>192/100  in the setting of missing 2nd home labetalol dose today, abbreviated HD session today, and current headache. At home on Labetalol 300mg  TID, amlodipine 10mg  daily, telmisartan 40mg  daily. - continue home meds - monitor BP closely - added PRN hydralazine for elevated blood pressures  FEN/GI: Heart Healthy Prophylaxis: SCDs  Disposition: admit to telemetry, attending Dr. Ardelia Mems  History of Present Illness:  Candace Griffin is a 42 y.o. female presenting with symptomatic anemia with Hb 6.3 noted at HD earlier today. Stated she was sleeping while undergoing HD and the nurse tried to wake her up but was told she was unresponsive for 30 min. States she heard the nurse but couldn't say anything back to her, she thinks it was sleep paralysis. Has had previous episodes in the past, the first episode when she was a senior in high school. Nurse was worried because this has never happened during HD before, and Hgb was checked and found to be low. Patient reports that she consistently has low Hb while at dialysis, however today with hgb low at 6.3 and episode of possible sleep paralysis, she was sent to the emergency department for further workup. The patient states the last 5 months has been having heavy periods, but not every month, though she feels she has had  low Hb at HD prior to these heavy cycles. Denies blood in stool, hematemesis, hemoptysis, chest pain. No palpitations today. Previously, last blood transfusion 2 months ago when she felt weakness, fatigue, palpitations. Reports last period started last Thursday and stopped  this Tuesday. Started feeling weak and tired after HD last Friday. Periods last for 5 days, soaks through maxipads (the overnight ones) every 1-1.5 hours with clots noticed. Cycles are regular every 25-28 days, give or take a week or  two. Had bad periods in high school but improved when she had children. Not on hormonal contraception since she was a teenager. Not sexually active for the last three years. Not previously evaluated by GYN for menorrhagia. Knows when her Hb is low because she will feel tired, weak, sometimes will have palpitations.   Review Of Systems: Per HPI   ROS  Patient Active Problem List   Diagnosis Date Noted  . Anemia 12/03/2017  . LLQ abdominal pain 11/17/2017  . Pain in lower back 07/14/2017  . Encounter for routine gynecological examination 02/25/2017  . Essential hypertension   . Absolute anemia   . AKI (acute kidney injury) ([place]) 09/17/2014  . Acute renal failure ([place]) 09/17/2014  . Low grade squamous intraepithelial lesion (LGSIL) on cervical Pap smear 05/03/2014  . Pyelonephritis 11/02/2013  . Genital warts 07/31/2013  . Atypical squamous cell changes of undetermined significance (ASCUS) on cervical cytology with positive high risk human papilloma virus (HPV) 07/19/2013  . Vaginal discharge 03/01/2013  . Right ankle pain 12/13/2012  . Hypertension 04/21/2012  . Well woman exam 04/21/2012  . History of renal transplant 01/07/2012  . Cerumen impaction 12/29/2011  . Bacterial vaginosis 04/29/2011  . GERD 10/10/2009  . ANEMIA 08/11/2007  . GENITAL HERPES, HX OF 08/11/2007  . End-stage renal disease needing dialysis ([place]) 10/21/2006    Past Medical History: Past Medical History:  Diagnosis Date  . Chronic glomerulonephritis   . Dialysis patient ([place])   . End stage renal disease ([place])   . GERD (gastroesophageal reflux disease)   . History of renal transplant Oct 15, 2011  . Hypertension     Past Surgical History: Past Surgical History:  Procedure Laterality Date  . COLPOSCOPY  2016  . diaylsis shunt Right    arm  . KIDNEY TRANSPLANT  10/15/11   deceased donor kidney    Social History: Social History   Tobacco Use  . Smoking status: Never Smoker  . Smokeless tobacco:  Never Used  Substance Use Topics  . Alcohol use: No  . Drug use: No   Additional social history:   Please also refer to relevant sections of EMR.  Family History: Family History  Problem Relation Age of Onset  . Diabetes Father   . Diabetes Paternal Aunt   . Diabetes Paternal Grandmother   . Alcohol abuse Paternal Grandmother   . Alcohol abuse Paternal Uncle   . Cancer Maternal Grandmother        breast  . Breast cancer Maternal Grandmother   . Alcohol abuse Paternal Grandfather     Allergies and Medications: Allergies  Allergen Reactions  . Lisinopril Cough  . Tape Rash and Other (See Comments)    Causes welts also   No current facility-administered medications on file prior to encounter.    Current Outpatient Medications on File Prior to Encounter  Medication Sig Dispense Refill  . amLODipine (NORVASC) 10 MG tablet Take 10 mg by mouth every evening.  11  . carbamide peroxide (DEBROX) 6.5 % otic solution Place 5 drops into the right ear 2 (two) times daily. (Patient taking differently: Place 5 drops into both ears as needed (for wax removal). ) 15 mL 0  . labetalol (NORMODYNE) 300 MG tablet Take 300 mg by mouth  3 (three) times daily.    . methocarbamol (ROBAXIN) 500 MG tablet Take 500 mg by mouth 3 (three) times daily as needed for muscle spasms.  0  . multivitamin (RENA-VIT) TABS tablet Take 1 tablet by mouth at bedtime.  0  . omeprazole (PRILOSEC) 20 MG capsule Take 20 mg by mouth daily. Take after dialysis on Mon/Wed/Fri    . sevelamer (RENAGEL) 800 MG tablet Take 1,600-4,000 mg by mouth See admin instructions. Take 4,000 mg by mouth three times a day with meals and 1,600 mg with each snack    . sulfamethoxazole-trimethoprim (BACTRIM DS,SEPTRA DS) 800-160 MG tablet Take 1 tablet by mouth daily. FOR 16 DAYS  0  . telmisartan (MICARDIS) 40 MG tablet Take 40 mg by mouth daily.    . Darbepoetin Alfa (ARANESP) 200 MCG/0.4ML SOSY injection Inject 0.4 mLs (200 mcg total) into  the vein every Tuesday with hemodialysis. 1.68 mL 0    Objective: BP (!) 192/100   Pulse 77   Temp 98.4 F (36.9 C) (Oral)   Resp 18   Ht 5\' 7"  (1.702 m)   Wt 142 lb 9.6 oz (64.7 kg)   SpO2 100%   BMI 22.33 kg/m  Exam: General: pleasant female in NAD, lying in bed, tired appearing Eyes: EOMI, PERRL, conjunctival pallor ENTM: MMM, pale MM Neck: ROM intact Cardiovascular: RRR, no murmurs, rubs, gallops Respiratory: CTAB, normal WOB on RA Gastrointestinal: soft, NTND, +BS MSK: ROM grossly intact, strength 5/5 to U/LE bilaterally, No edema.  Neuro:Alert and oriented, speech normal. Optic field normal. PERRL, Extraocular movements intact. Hearing grossly intact bilaterally. Tongue protrudes normally with no deviation. Shoulder shrug, smile symmetric.  Derm: no rashes or lesions noted Psych: mood and affect appropriate  Labs and Imaging: CBC BMET  Recent Labs  Lab 12/03/17 1254  WBC 3.1*  HGB 6.5*  HCT 20.3*  PLT 177   Recent Labs  Lab 12/03/17 1254  NA 138  K 3.3*  CL 95*  CO2 30  BUN 10  CREATININE 3.86*  GLUCOSE 71  CALCIUM 7.7*     No results found.  Everrett Coombe, MD 12/03/2017, 9:06 PM PGY-1, [place] [facility] pager: [phone], text pages welcome  I have separately seen and examined the patient. I have discussed the findings and exam with Dr. Ky Barban and agree with the above note in its edited form.  My changes/additions are outlined in BLUE.   Everrett Coombe, MD PGY-2 [facility]

## 2017-12-03 NOTE — Progress Notes (Addendum)
Patient arrived to floor, vital signs stable, admissions nurse called but no answer, blood work done, pt on telemetry, no complaints of distress.  Patient arrived with dialysis access still in place, iv team consulted to remove.  IV team at bedside to discontinue pt access.

## 2017-12-03 NOTE — ED Provider Notes (Signed)
[facility] EMERGENCY DEPARTMENT Provider Note   CSN: [id number] Arrival date & time: 12/03/17  1117     History   Chief Complaint Chief Complaint  Patient presents with  . Near Syncope    HPI Candace Griffin is a 42 y.o. female.   Near Syncope  This is a recurrent problem. The current episode started less than 1 hour ago. The problem occurs constantly. The problem has been resolved. Pertinent negatives include no chest pain, no headaches and no shortness of breath. Nothing aggravates the symptoms. Nothing relieves the symptoms. She has tried nothing for the symptoms.    Past Medical History:  Diagnosis Date  . Chronic glomerulonephritis   . Dialysis patient ([place])   . End stage renal disease ([place])   . GERD (gastroesophageal reflux disease)   . History of renal transplant 10/30/2011  . Hypertension     Patient Active Problem List   Diagnosis Date Noted  . LLQ abdominal pain 11/17/2017  . Pain in lower back 07/14/2017  . Encounter for routine gynecological examination 02/25/2017  . Essential hypertension   . Absolute anemia   . AKI (acute kidney injury) ([place]) 09/17/2014  . Acute renal failure ([place]) 09/17/2014  . Low grade squamous intraepithelial lesion (LGSIL) on cervical Pap smear 05/03/2014  . Pyelonephritis 11/02/2013  . Genital warts 07/31/2013  . Atypical squamous cell changes of undetermined significance (ASCUS) on cervical cytology with positive high risk human papilloma virus (HPV) 07/19/2013  . Vaginal discharge 03/01/2013  . Right ankle pain 12/13/2012  . Hypertension 04/21/2012  . Well woman exam 04/21/2012  . History of renal transplant 01/07/2012  . Cerumen impaction 12/29/2011  . Bacterial vaginosis 04/29/2011  . GERD 10/10/2009  . ANEMIA 08/11/2007  . GENITAL HERPES, HX OF 08/11/2007  . End-stage renal disease needing dialysis ([place]) 10/21/2006    Past Surgical History:  Procedure Laterality Date  . COLPOSCOPY  2016  .  diaylsis shunt Right    arm  . KIDNEY TRANSPLANT  10/30/11   deceased donor kidney     OB History    Gravida  4   Para  3   Term  2   Preterm  1   AB  1   Living  3     SAB      TAB      Ectopic  1   Multiple      Live Births               Home Medications    Prior to Admission medications   Medication Sig Start Date End Date Taking? Authorizing Provider  amLODipine (NORVASC) 10 MG tablet Take 10 mg by mouth every evening. 07/10/15   [provider]  butalbital-acetaminophen-caffeine (FIORICET, ESGIC) 50-325-40 MG tablet Take 1 tablet by mouth daily as needed for headache (on dialysis days for headaches). 10/27/16   Penumalli, Earlean Polka, MD  carbamide peroxide (DEBROX) 6.5 % otic solution Place 5 drops into the right ear 2 (two) times daily. 01/07/16   Frazier Richards, MD  cyclobenzaprine (FLEXERIL) 5 MG tablet Take 1 tablet (5 mg total) by mouth 3 (three) times daily as needed for muscle spasms. 07/14/17   Mikell, Jeani Sow, MD  Darbepoetin Alfa (ARANESP) 200 MCG/0.4ML SOSY injection Inject 0.4 mLs (200 mcg total) into the vein every Tuesday with hemodialysis. 09/20/14   Virginia Crews, MD  labetalol (NORMODYNE) 300 MG tablet Take 300 mg by mouth 3 (three) times daily. 09/25/14  [provider]  multivitamin (RENA-VIT) TABS tablet Take 1 tablet by mouth at bedtime. 09/20/14   Virginia Crews, MD  omeprazole (PRILOSEC) 20 MG capsule Take 20 mg by mouth daily.    [provider]  sevelamer (RENAGEL) 800 MG tablet Take 3,200 mg by mouth 3 (three) times daily with meals.    [provider]  telmisartan (MICARDIS) 40 MG tablet Take 40 mg by mouth daily.    [provider]    Family History Family History  Problem Relation Age of Onset  . Diabetes Father   . Diabetes Paternal Aunt   . Diabetes Paternal Grandmother   . Alcohol abuse Paternal Grandmother   . Alcohol abuse Paternal Uncle   . Cancer Maternal  Grandmother        breast  . Breast cancer Maternal Grandmother   . Alcohol abuse Paternal Grandfather     Social History Social History   Tobacco Use  . Smoking status: Never Smoker  . Smokeless tobacco: Never Used  Substance Use Topics  . Alcohol use: No  . Drug use: No     Allergies   Patient has no known allergies.   Review of Systems Review of Systems  Respiratory: Negative for shortness of breath.   Cardiovascular: Positive for near-syncope. Negative for chest pain.  Neurological: Negative for headaches.  All other systems reviewed and are negative.    Physical Exam Updated Vital Signs BP (!) 158/91   Pulse 80   Temp 98.4 F (36.9 C) (Oral)   Resp 19   SpO2 100%   Physical Exam  Constitutional: She appears well-developed and well-nourished.  HENT:  Head: Normocephalic and atraumatic.  Eyes: Conjunctivae and EOM are normal.  Neck: Normal range of motion.  Cardiovascular: Normal rate and regular rhythm.  Hypertensive  Pulmonary/Chest: No stridor. No respiratory distress.  Abdominal: Soft. Bowel sounds are normal. She exhibits no distension.  Neurological: She is alert.  Skin: Skin is warm and dry.  Nursing note and vitals reviewed.    ED Treatments / Results  Labs (all labs ordered are listed, but only abnormal results are displayed) Labs Reviewed  CBC WITH DIFFERENTIAL/PLATELET - Abnormal; Notable for the following components:      Result Value   WBC 3.1 (*)    RBC 2.24 (*)    Hemoglobin 6.5 (*)    HCT 20.3 (*)    RDW 16.4 (*)    All other components within normal limits  COMPREHENSIVE METABOLIC PANEL - Abnormal; Notable for the following components:   Potassium 3.3 (*)    Chloride 95 (*)    Creatinine, Ser 3.86 (*)    Calcium 7.7 (*)    ALT 12 (*)    GFR calc non Af Amer 13 (*)    GFR calc Af Amer 16 (*)    All other components within normal limits  CBG MONITORING, ED  TYPE AND SCREEN  PREPARE RBC (CROSSMATCH)    EKG EKG  Interpretation  Date/Time:  Friday December 03 2017 11:40:07 EDT Ventricular Rate:  79 PR Interval:    QRS Duration: 141 QT Interval:  491 QTC Calculation: 563 R Axis:   38 Text Interpretation:  Sinus rhythm Biatrial enlargement Left bundle branch block Artifact in lead(s) I II III aVR aVL aVF V1 V2 Confirmed by Merrily Pew [phone]) on 12/03/2017 2:32:47 PM   Radiology No results found.  Procedures Procedures (including critical care time)  CRITICAL CARE Performed by: Merrily Pew Total critical care  time: 35 minutes Critical care time was exclusive of separately billable procedures and treating other patients. Critical care was necessary to treat or prevent imminent or life-threatening deterioration. Critical care was time spent personally by me on the following activities: development of treatment plan with patient and/or surrogate as well as nursing, discussions with consultants, evaluation of patient's response to treatment, examination of patient, obtaining history from patient or surrogate, ordering and performing treatments and interventions, ordering and review of laboratory studies, ordering and review of radiographic studies, pulse oximetry and re-evaluation of patient's condition.   Medications Ordered in ED Medications  labetalol (NORMODYNE) tablet 300 mg (has no administration in time range)  acetaminophen (TYLENOL) tablet 650 mg (has no administration in time range)  0.9 %  sodium chloride infusion (has no administration in time range)     Initial Impression / Assessment and Plan / ED Course  I have reviewed the triage vital signs and the nursing notes.  Pertinent labs & imaging results that were available during my care of the patient were reviewed by me and considered in my medical decision making (see chart for details).     Syncopal or near single episode at dialysis.  Found to have a hemoglobin 6.3 sent here for further evaluation.  Initially had some low  blood pressures but have since become hypertensive.  She has a headache with this which is normal she has not taken her antihypertensives this afternoon.  We will give a dose of labetalol and Tylenol.  Repeat hemoglobin here 6.5 so will transfuse secondary to the syncope and hemoglobin less than 7.  Will discuss with family practice for admission for the same.  Final Clinical Impressions(s) / ED Diagnoses   Final diagnoses:  Syncope and collapse  Anemia, unspecified type    ED Discharge Orders    None       Demani Mcbrien, Corene Cornea, MD 12/03/17 [phone]

## 2017-12-03 NOTE — ED Notes (Signed)
Admitting at bedside 

## 2017-12-03 NOTE — ED Notes (Signed)
Pt dialysis site still accessed.

## 2017-12-03 NOTE — ED Triage Notes (Signed)
Pt arrives via EMS from [facility]. Pt with episode of being unresponsive for approx 30 seconds. Pt states she was sleeping, however when the RN came to wake her she did not respond. Pt with no complaints on arrival. Pt reports feeling weak. Dialysis RN states HGB 6.3. Pt received half of her treatment today. Pt given 300cc fluid bolus at dialysis. EKG unremarkable. BP 169/94, HR 80, 98% 2L [place], CBG 89.

## 2017-12-04 DIAGNOSIS — D649 Anemia, unspecified: Secondary | ICD-10-CM | POA: Diagnosis not present

## 2017-12-04 LAB — BASIC METABOLIC PANEL
Anion gap: 10 (ref 5–15)
BUN: 17 mg/dL (ref 6–20)
CO2: 29 mmol/L (ref 22–32)
Calcium: 7.8 mg/dL — ABNORMAL LOW (ref 8.9–10.3)
Chloride: 97 mmol/L — ABNORMAL LOW (ref 101–111)
Creatinine, Ser: 6.06 mg/dL — ABNORMAL HIGH (ref 0.44–1.00)
GFR calc Af Amer: 9 mL/min — ABNORMAL LOW (ref >60)
GFR calc non Af Amer: 8 mL/min — ABNORMAL LOW (ref >60)
Glucose, Bld: 77 mg/dL (ref 65–99)
Potassium: 3.8 mmol/L (ref 3.5–5.1)
Sodium: 136 mmol/L (ref 135–145)

## 2017-12-04 LAB — CBC WITH DIFFERENTIAL/PLATELET
Basophils Absolute: 0 10*3/uL (ref 0.0–0.1)
Basophils Relative: 0 %
Eosinophils Absolute: 0.1 10*3/uL (ref 0.0–0.7)
Eosinophils Relative: 3 %
HCT: 20.3 % — ABNORMAL LOW (ref 36.0–46.0)
Hemoglobin: 6.5 g/dL — CL (ref 12.0–15.0)
Lymphocytes Relative: 23 %
Lymphs Abs: 0.7 10*3/uL (ref 0.7–4.0)
MCH: 29 pg (ref 26.0–34.0)
MCHC: 32 g/dL (ref 30.0–36.0)
MCV: 90.6 fL (ref 78.0–100.0)
Monocytes Absolute: 0.3 10*3/uL (ref 0.1–1.0)
Monocytes Relative: 10 %
Neutro Abs: 2 10*3/uL (ref 1.7–7.7)
Neutrophils Relative %: 64 %
Platelets: 177 10*3/uL (ref 150–400)
RBC: 2.24 MIL/uL — ABNORMAL LOW (ref 3.87–5.11)
RDW: 16.4 % — ABNORMAL HIGH (ref 11.5–15.5)
WBC: 3.1 10*3/uL — ABNORMAL LOW (ref 4.0–10.5)

## 2017-12-04 LAB — CBC
HCT: 24.3 % — ABNORMAL LOW (ref 36.0–46.0)
Hemoglobin: 7.9 g/dL — ABNORMAL LOW (ref 12.0–15.0)
MCH: 29.6 pg (ref 26.0–34.0)
MCHC: 32.5 g/dL (ref 30.0–36.0)
MCV: 91 fL (ref 78.0–100.0)
Platelets: 154 10*3/uL (ref 150–400)
RBC: 2.67 MIL/uL — ABNORMAL LOW (ref 3.87–5.11)
RDW: 16 % — ABNORMAL HIGH (ref 11.5–15.5)
WBC: 4.6 10*3/uL (ref 4.0–10.5)

## 2017-12-04 LAB — HEMOGLOBIN AND HEMATOCRIT, BLOOD
HCT: 24.3 % — ABNORMAL LOW (ref 36.0–46.0)
Hemoglobin: 7.9 g/dL — ABNORMAL LOW (ref 12.0–15.0)

## 2017-12-04 MED ORDER — LABETALOL HCL 300 MG PO TABS
300.0000 mg | ORAL_TABLET | Freq: Three times a day (TID) | ORAL | Status: DC
  Administered 2017-12-04: 300 mg via ORAL
  Filled 2017-12-04 (×2): qty 1

## 2017-12-04 NOTE — Progress Notes (Signed)
Patient received scheduled Labetalol 300 mg for her high BP. Will continue to monitor.

## 2017-12-04 NOTE — Progress Notes (Signed)
OT Cancellation Note  Patient Details Name: Yvonda Fouty MRN: [id number] DOB: October 31, 1975   Cancelled Treatment:    Reason Eval/Treat Not Completed: OT screened, no needs identified, will sign off. Per PT, pt independent with mobility and currently without acute OT needs. Will screen and sign off at this time. Please re-consult if needs change.   Binnie Kand M.S., OTR/L Pager: [phone]  12/04/2017, 11:18 AM

## 2017-12-04 NOTE — Discharge Summary (Signed)
[facility] Discharge Summary  Patient name: Candace Griffin Medical record number: [id number] Date of birth: Jan 28, 1976 Age: 42 y.o. Gender: female Date of Admission: 12/03/2017  Date of Discharge: 12/04/2017 Admitting Physician: Leeanne Rio, MD  Primary Care Provider: Tonette Bihari, MD Consultants: None  Indication for Hospitalization: symptomatic anemia  Discharge Diagnoses/Problem List:  Symptomatic anemia ESRD on HD, s/p renal transplant Hypertension  Disposition: Home  Discharge Condition: Improved  Discharge Exam:  General: Well-appearing female in NAD, resting in bed Cardiovascular: RRR, S1, S2, no mrg; right forearm AV fistula with thrill Respiratory: CTAB, no increased WOB Abdomen: +BS, soft, NT Extremities: No LE edema, moves all spontaneously Exam performed by Dr. Ola Spurr the day of discharge.  Brief Hospital Course:  Candace Griffin a 42 y.o.female with PMH significant for ESRD on HD (MWF) s/p renal transplant 2013, GERD, HSV2 whopresented with symptomatic anemia noticed at HD (Hb 6.3). Patient had an episode of unresponsiveness she attributed to sleep paralysis, but due to low Hb was sent to the ED. Hb in the ED 6.5, Preg test negative. Normal iron studies although did receive iron transfusion at dialysis the day of admission. Normal PT/INR. Patient endorsed a history of menorrhagia for which she has not received workup for in the past, last period a week prior to presentation. Patient was otherwise hemodynamically stable on admission with some reported weakness and fatigue. BP on admission elevated in the setting of missing 2nd home labetalol dose, abbreviated HD session, and headache. BP normalized with home labetalol and tylenol. Patient underwent 1u blood transfusion with appropriate response to Hb 7.9. Patient declined FOBT and rectal exam.  Issues for Follow Up:  1. Consider gynecological workup and treatment  for menorrhagia. No known history of bleeding disorders but could consider further workup to rule out. 2. Patient endorses episode of sleep paralysis at HD on the day of admission - could consider outpatient sleep study.  Significant Procedures: 1u pRBC transfusion  Significant Labs and Imaging:  Recent Labs  Lab 12/03/17 1254 12/04/17 0633 12/04/17 1350  WBC 3.1* 4.6  --   HGB 6.5* 7.9* 7.9*  HCT 20.3* 24.3* 24.3*  PLT 177 154  --    Recent Labs  Lab 12/03/17 1254 12/04/17 0633  NA 138 136  K 3.3* 3.8  CL 95* 97*  CO2 30 29  GLUCOSE 71 77  BUN 10 17  CREATININE 3.86* 6.06*  CALCIUM 7.7* 7.8*  ALKPHOS 67  --   AST 20  --   ALT 12*  --   ALBUMIN 3.6  --     No results found.  Results/Tests Pending at Time of Discharge: None  Discharge Medications:  Allergies as of 12/04/2017      Reactions   Lisinopril Cough   Tape Rash, Other (See Comments)   Causes welts also      Medication List    STOP taking these medications   sulfamethoxazole-trimethoprim 800-160 MG tablet Commonly known as:  BACTRIM DS,SEPTRA DS     TAKE these medications   amLODipine 10 MG tablet Commonly known as:  NORVASC Take 10 mg by mouth every evening.   carbamide peroxide 6.5 % OTIC solution Commonly known as:  DEBROX Place 5 drops into the right ear 2 (two) times daily. What changed:    how to take this  when to take this  reasons to take this   Darbepoetin Alfa 200 MCG/0.4ML Sosy injection Commonly known as:  ARANESP Inject 0.4 mLs (200  mcg total) into the vein every Tuesday with hemodialysis.   labetalol 300 MG tablet Commonly known as:  NORMODYNE Take 300 mg by mouth 3 (three) times daily.   methocarbamol 500 MG tablet Commonly known as:  ROBAXIN Take 500 mg by mouth 3 (three) times daily as needed for muscle spasms.   multivitamin Tabs tablet Take 1 tablet by mouth at bedtime.   omeprazole 20 MG capsule Commonly known as:  PRILOSEC Take 20 mg by mouth daily. Take  after dialysis on Mon/Wed/Fri   sevelamer 800 MG tablet Commonly known as:  RENAGEL Take 1,600-4,000 mg by mouth See admin instructions. Take 4,000 mg by mouth three times a day with meals and 1,600 mg with each snack   telmisartan 40 MG tablet Commonly known as:  MICARDIS Take 40 mg by mouth daily.       Discharge Instructions: Please refer to Patient Instructions section of EMR for full details.  Patient was counseled important signs and symptoms that should prompt return to medical care, changes in medications, dietary instructions, activity restrictions, and follow up appointments.   Follow-Up Appointments: Follow-up Information    Tonette Bihari, MD. Go on 12/09/2017.   Specialty:  Family Medicine Why:  9:50 appointment with Dr. Emmaline Life for hospital follow-up Contact information: [place] [address] [phone]           Rory Percy, DO 12/05/2017, 6:04 PM PGY-1, [facility]

## 2017-12-04 NOTE — Progress Notes (Signed)
Family Medicine Teaching Service Daily Progress Note Intern Pager: [phone]  Patient name: Candace Griffin Medical record number: [id number] Date of birth: October 18, 1975 Age: 42 y.o. Gender: female  Primary Care Provider: Tonette Bihari, MD Consultants: None Code Status: Full  Pt Overview and Major Events to Date:  Candace Griffin a 42 y.o.femalepresenting with symptomatic anemia noticed at HD. PMH is significant forESRD on HD (MWF) s/p renal transplant 2013, GERD, HSV2.  Symptomatic anemia Hgb improved to 7.5 at 1 u pRBCs. Normal telemetry overnight. Hb 6.3 noted at HD, 6.5 in ED.  Unknown baseline, last reported in 01/2016 was 10.0. Has received transfusions in the past, most recent 2 months ago. Likely due to menorrhagia given heavy periods the last 5 months and no other reported blood loss. GI loss less likely as patient denies melena or hematochezia and no abdominal pain. Declines rectal exam today and reports negative FOBT at dialysis earlier this year. Consider evaluation for bleeding disorder such as Factor 5 [place]iden, vWD given history of nosebleeds (ED visit 07/2017) and menorrhagia. Also can consider pelvic ultrasound to evaluate for fibroids or endometrial hyperplasia, could likely be done as an outpatient workup with GYN follow up. Normal iron studies but patient reports getting iron transfusion in dialysis on day of admission. Normal PT/INR. Negative pregnancy test. - s/p 1u pRBC ordered in ED, f/u post H&H - PT/OT --> no follow-up needed - d/c cardiac monitoring - d/c continuous pulse ox - vitals per unit routine  ESRD on HD MWF, s/pRenal transplant  2/2 chronic glomerulonephritis, s/p renal transplant February 2013 at [facility]. Transplant failed in 2016 when she reinitiated HD. Hasn't made urine since October 2018 per chart review. Currently being evaluated for repeat transplant. MWF HD. Receives dialysis through [facility].   Hypertension SBP in 170s, compared to  192/100 yesterday when missed a dose of labetolol and had HD session shortened by 1 hour due to presentation. At home on Labetalol 300mg  TID, amlodipine 10mg  daily, telmisartan 40mg  daily.  - continue home meds, had not yet received ARB - monitor BP closely - received PRN hydralazine x2  FEN/GI: Heart Healthy Prophylaxis:SCDs  Disposition:anticipate discharge home today  Subjective:  Patient reports feeling well today. She is eager to go home. She says she has frequent episodes of sleep paralysis and thinks that is what happened during "unresponsive" episode at dialysis yesterday. She says menstrual bleeding has stopped today. She thinks her blood pressure is elevated because she is "frustrated" with still being admitted. She denies feeling overloading--says she normally will have decreased stamina and some shortness of  Objective: Temp:  [98 F (36.7 C)-99 F (37.2 C)] 99 F (37.2 C) (04/13 0513) Pulse Rate:  [75-82] 75 (04/13 0513) Resp:  [14-22] 16 (04/13 0513) BP: (138-193)/(81-107) 169/99 (04/13 0513) SpO2:  [97 %-100 %] 100 % (04/13 0513) Weight:  [142 lb 9.6 oz (64.7 kg)] 142 lb 9.6 oz (64.7 kg) (04/12 1654) Physical Exam: General: Well-appearing female in NAD, resting in bed Cardiovascular: RRR, S1, S2, no mrg; right forearm AV fistula with thrill Respiratory: CTAB, no increased WOB Abdomen: +BS, soft, NT Extremities: No LE edema, moves all spontaneously  Laboratory: Recent Labs  Lab 12/03/17 1254 12/04/17 0633  WBC 3.1* 4.6  HGB 6.5* 7.9*  HCT 20.3* 24.3*  PLT 177 154   Recent Labs  Lab 12/03/17 1254 12/04/17 0633  NA 138 136  K 3.3* 3.8  CL 95* 97*  CO2 30 29  BUN 10 17  CREATININE 3.86* 6.06*  CALCIUM 7.7* 7.8*  PROT 6.9  --   BILITOT 0.5  --   ALKPHOS 67  --   ALT 12*  --   AST 20  --   GLUCOSE 71 77    Imaging/Diagnostic Tests: No results found.  Rogue Bussing, MD 12/04/2017, 8:45 AM PGY-3, [name]  Intern pager: [phone], text pages welcome

## 2017-12-04 NOTE — Evaluation (Signed)
Physical Therapy Evaluation Patient Details Name: Candace Griffin MRN: [id number] DOB: [birth year] Today's Date: 12/04/2017   History of Present Illness  42 y.o. female presenting with symptomatic anemia noticed at HD. PMH is significant for ESRD on HD (MWF) s/p renal transplant 2013, GERD, HSV2.    Clinical Impression  PT eval complete. Pt is independent with all functional mobility. Pt without c/o dizziness. See below for further details. No further PT intervention indicated. No DME needed. PT signing off.    Follow Up Recommendations No PT follow up    Equipment Recommendations  None recommended by PT    Recommendations for Other Services       Precautions / Restrictions Precautions Precautions: None Restrictions Weight Bearing Restrictions: No      Mobility  Bed Mobility Overal bed mobility: Independent                Transfers Overall transfer level: Independent Equipment used: None                Ambulation/Gait Ambulation/Gait assistance: Independent Ambulation Distance (Feet): 350 Feet Assistive device: None Gait Pattern/deviations: WFL(Within Functional Limits)   Gait velocity interpretation: >4.37 ft/sec, indicative of normal walking speed    Stairs Stairs: Yes Stairs assistance: Modified independent (Device/Increase time) Stair Management: No rails Number of Stairs: 10    Wheelchair Mobility    Modified Rankin (Stroke Patients Only)       Balance Overall balance assessment: No apparent balance deficits (not formally assessed)                                           Pertinent Vitals/Pain Pain Assessment: No/denies pain    Home Living Family/patient expects to be discharged to:: Private residence Living Arrangements: Children Available Help at Discharge: Family;Available PRN/intermittently Type of Home: House Home Access: Stairs to enter Entrance Stairs-Rails: Psychiatric nurse of  Steps: 3 Home Layout: Two level;Bed/bath upstairs Home Equipment: None      Prior Function Level of Independence: Independent               Hand Dominance   Dominant Hand: Right    Extremity/Trunk Assessment   Upper Extremity Assessment Upper Extremity Assessment: Overall WFL for tasks assessed    Lower Extremity Assessment Lower Extremity Assessment: Overall WFL for tasks assessed    Cervical / Trunk Assessment Cervical / Trunk Assessment: Normal  Communication   Communication: No difficulties  Cognition Arousal/Alertness: Awake/alert Behavior During Therapy: WFL for tasks assessed/performed Overall Cognitive Status: Within Functional Limits for tasks assessed                                        General Comments      Exercises     Assessment/Plan    PT Assessment Patent does not need any further PT services  PT Problem List         PT Treatment Interventions      PT Goals (Current goals can be found in the Care Plan section)  Acute Rehab PT Goals Patient Stated Goal: home PT Goal Formulation: All assessment and education complete, DC therapy    Frequency     Barriers to discharge        Co-evaluation  AM-PAC PT "6 Clicks" Daily Activity  Outcome Measure Difficulty turning over in bed (including adjusting bedclothes, sheets and blankets)?: None Difficulty moving from lying on back to sitting on the side of the bed? : None Difficulty sitting down on and standing up from a chair with arms (e.g., wheelchair, bedside commode, etc,.)?: None Help needed moving to and from a bed to chair (including a wheelchair)?: None Help needed walking in hospital room?: None Help needed climbing 3-5 steps with a railing? : None 6 Click Score: 24    End of Session   Activity Tolerance: Patient tolerated treatment well Patient left: in bed;with call bell/phone within reach Nurse Communication: Mobility status PT Visit  Diagnosis: Difficulty in walking, not elsewhere classified (R26.2)    Time: 7373-6681 PT Time Calculation (min) (ACUTE ONLY): 12 min   Charges:   PT Evaluation $PT Eval Low Complexity: 1 Low     PT G Codes:        Lorrin Goodell, PT  Office # [phone] Pager [phone]   Lorriane Shire 12/04/2017, 11:04 AM

## 2017-12-04 NOTE — Progress Notes (Signed)
Patient refused Hydralazine PRN for high BP. She said is not working, she used to have high BP. Will continue to monitor.

## 2017-12-04 NOTE — Progress Notes (Signed)
Patient was discharged home by MD order; discharged instructions  review and give to patient with care notes; IV DIC; skin intact; patient will be escorted to the car by nurse tech via wheelchair.  

## 2017-12-06 DIAGNOSIS — N2581 Secondary hyperparathyroidism of renal origin: Secondary | ICD-10-CM | POA: Diagnosis not present

## 2017-12-06 DIAGNOSIS — D631 Anemia in chronic kidney disease: Secondary | ICD-10-CM | POA: Diagnosis not present

## 2017-12-06 DIAGNOSIS — D509 Iron deficiency anemia, unspecified: Secondary | ICD-10-CM | POA: Diagnosis not present

## 2017-12-06 DIAGNOSIS — I159 Secondary hypertension, unspecified: Secondary | ICD-10-CM | POA: Diagnosis not present

## 2017-12-06 DIAGNOSIS — N186 End stage renal disease: Secondary | ICD-10-CM | POA: Diagnosis not present

## 2017-12-06 LAB — BPAM RBC
Blood Product Expiration Date: 201904252359
Blood Product Expiration Date: 201905182359
ISSUE DATE / TIME: 201904122034
Unit Type and Rh: 9500
Unit Type and Rh: 9500

## 2017-12-06 LAB — TYPE AND SCREEN
ABO/RH(D): O POS
Antibody Screen: POSITIVE
DAT, IgG: NEGATIVE
Donor AG Type: NEGATIVE
Unit division: 0
Unit division: 0

## 2017-12-08 DIAGNOSIS — D631 Anemia in chronic kidney disease: Secondary | ICD-10-CM | POA: Diagnosis not present

## 2017-12-08 DIAGNOSIS — D509 Iron deficiency anemia, unspecified: Secondary | ICD-10-CM | POA: Diagnosis not present

## 2017-12-08 DIAGNOSIS — N2581 Secondary hyperparathyroidism of renal origin: Secondary | ICD-10-CM | POA: Diagnosis not present

## 2017-12-08 DIAGNOSIS — I159 Secondary hypertension, unspecified: Secondary | ICD-10-CM | POA: Diagnosis not present

## 2017-12-08 DIAGNOSIS — N186 End stage renal disease: Secondary | ICD-10-CM | POA: Diagnosis not present

## 2017-12-09 ENCOUNTER — Inpatient Hospital Stay: Payer: Medicare Other | Admitting: Internal Medicine

## 2017-12-10 DIAGNOSIS — N2581 Secondary hyperparathyroidism of renal origin: Secondary | ICD-10-CM | POA: Diagnosis not present

## 2017-12-10 DIAGNOSIS — I159 Secondary hypertension, unspecified: Secondary | ICD-10-CM | POA: Diagnosis not present

## 2017-12-10 DIAGNOSIS — D509 Iron deficiency anemia, unspecified: Secondary | ICD-10-CM | POA: Diagnosis not present

## 2017-12-10 DIAGNOSIS — N186 End stage renal disease: Secondary | ICD-10-CM | POA: Diagnosis not present

## 2017-12-10 DIAGNOSIS — D631 Anemia in chronic kidney disease: Secondary | ICD-10-CM | POA: Diagnosis not present

## 2017-12-13 DIAGNOSIS — N186 End stage renal disease: Secondary | ICD-10-CM | POA: Diagnosis not present

## 2017-12-13 DIAGNOSIS — D509 Iron deficiency anemia, unspecified: Secondary | ICD-10-CM | POA: Diagnosis not present

## 2017-12-13 DIAGNOSIS — I159 Secondary hypertension, unspecified: Secondary | ICD-10-CM | POA: Diagnosis not present

## 2017-12-13 DIAGNOSIS — N2581 Secondary hyperparathyroidism of renal origin: Secondary | ICD-10-CM | POA: Diagnosis not present

## 2017-12-13 DIAGNOSIS — D631 Anemia in chronic kidney disease: Secondary | ICD-10-CM | POA: Diagnosis not present

## 2017-12-15 DIAGNOSIS — D631 Anemia in chronic kidney disease: Secondary | ICD-10-CM | POA: Diagnosis not present

## 2017-12-15 DIAGNOSIS — D509 Iron deficiency anemia, unspecified: Secondary | ICD-10-CM | POA: Diagnosis not present

## 2017-12-15 DIAGNOSIS — N2581 Secondary hyperparathyroidism of renal origin: Secondary | ICD-10-CM | POA: Diagnosis not present

## 2017-12-15 DIAGNOSIS — I159 Secondary hypertension, unspecified: Secondary | ICD-10-CM | POA: Diagnosis not present

## 2017-12-15 DIAGNOSIS — N186 End stage renal disease: Secondary | ICD-10-CM | POA: Diagnosis not present

## 2017-12-17 DIAGNOSIS — D509 Iron deficiency anemia, unspecified: Secondary | ICD-10-CM | POA: Diagnosis not present

## 2017-12-17 DIAGNOSIS — N2581 Secondary hyperparathyroidism of renal origin: Secondary | ICD-10-CM | POA: Diagnosis not present

## 2017-12-17 DIAGNOSIS — I159 Secondary hypertension, unspecified: Secondary | ICD-10-CM | POA: Diagnosis not present

## 2017-12-17 DIAGNOSIS — N186 End stage renal disease: Secondary | ICD-10-CM | POA: Diagnosis not present

## 2017-12-17 DIAGNOSIS — D631 Anemia in chronic kidney disease: Secondary | ICD-10-CM | POA: Diagnosis not present

## 2017-12-20 DIAGNOSIS — N2581 Secondary hyperparathyroidism of renal origin: Secondary | ICD-10-CM | POA: Diagnosis not present

## 2017-12-20 DIAGNOSIS — D631 Anemia in chronic kidney disease: Secondary | ICD-10-CM | POA: Diagnosis not present

## 2017-12-20 DIAGNOSIS — N186 End stage renal disease: Secondary | ICD-10-CM | POA: Diagnosis not present

## 2017-12-20 DIAGNOSIS — D509 Iron deficiency anemia, unspecified: Secondary | ICD-10-CM | POA: Diagnosis not present

## 2017-12-20 DIAGNOSIS — I159 Secondary hypertension, unspecified: Secondary | ICD-10-CM | POA: Diagnosis not present

## 2017-12-22 DIAGNOSIS — I129 Hypertensive chronic kidney disease with stage 1 through stage 4 chronic kidney disease, or unspecified chronic kidney disease: Secondary | ICD-10-CM | POA: Diagnosis not present

## 2017-12-22 DIAGNOSIS — N186 End stage renal disease: Secondary | ICD-10-CM | POA: Diagnosis not present

## 2017-12-22 DIAGNOSIS — D509 Iron deficiency anemia, unspecified: Secondary | ICD-10-CM | POA: Diagnosis not present

## 2017-12-22 DIAGNOSIS — Z992 Dependence on renal dialysis: Secondary | ICD-10-CM | POA: Diagnosis not present

## 2017-12-22 DIAGNOSIS — D631 Anemia in chronic kidney disease: Secondary | ICD-10-CM | POA: Diagnosis not present

## 2017-12-22 DIAGNOSIS — N2581 Secondary hyperparathyroidism of renal origin: Secondary | ICD-10-CM | POA: Diagnosis not present

## 2017-12-24 DIAGNOSIS — D509 Iron deficiency anemia, unspecified: Secondary | ICD-10-CM | POA: Diagnosis not present

## 2017-12-24 DIAGNOSIS — N186 End stage renal disease: Secondary | ICD-10-CM | POA: Diagnosis not present

## 2017-12-24 DIAGNOSIS — N2581 Secondary hyperparathyroidism of renal origin: Secondary | ICD-10-CM | POA: Diagnosis not present

## 2017-12-24 DIAGNOSIS — D631 Anemia in chronic kidney disease: Secondary | ICD-10-CM | POA: Diagnosis not present

## 2017-12-27 DIAGNOSIS — N2581 Secondary hyperparathyroidism of renal origin: Secondary | ICD-10-CM | POA: Diagnosis not present

## 2017-12-27 DIAGNOSIS — D631 Anemia in chronic kidney disease: Secondary | ICD-10-CM | POA: Diagnosis not present

## 2017-12-27 DIAGNOSIS — D509 Iron deficiency anemia, unspecified: Secondary | ICD-10-CM | POA: Diagnosis not present

## 2017-12-27 DIAGNOSIS — N186 End stage renal disease: Secondary | ICD-10-CM | POA: Diagnosis not present

## 2017-12-29 DIAGNOSIS — D631 Anemia in chronic kidney disease: Secondary | ICD-10-CM | POA: Diagnosis not present

## 2017-12-29 DIAGNOSIS — D509 Iron deficiency anemia, unspecified: Secondary | ICD-10-CM | POA: Diagnosis not present

## 2017-12-29 DIAGNOSIS — N2581 Secondary hyperparathyroidism of renal origin: Secondary | ICD-10-CM | POA: Diagnosis not present

## 2017-12-29 DIAGNOSIS — N186 End stage renal disease: Secondary | ICD-10-CM | POA: Diagnosis not present

## 2017-12-31 DIAGNOSIS — N186 End stage renal disease: Secondary | ICD-10-CM | POA: Diagnosis not present

## 2017-12-31 DIAGNOSIS — D631 Anemia in chronic kidney disease: Secondary | ICD-10-CM | POA: Diagnosis not present

## 2017-12-31 DIAGNOSIS — N2581 Secondary hyperparathyroidism of renal origin: Secondary | ICD-10-CM | POA: Diagnosis not present

## 2017-12-31 DIAGNOSIS — D509 Iron deficiency anemia, unspecified: Secondary | ICD-10-CM | POA: Diagnosis not present

## 2018-01-03 DIAGNOSIS — D509 Iron deficiency anemia, unspecified: Secondary | ICD-10-CM | POA: Diagnosis not present

## 2018-01-03 DIAGNOSIS — D631 Anemia in chronic kidney disease: Secondary | ICD-10-CM | POA: Diagnosis not present

## 2018-01-03 DIAGNOSIS — Z7682 Awaiting organ transplant status: Secondary | ICD-10-CM | POA: Diagnosis not present

## 2018-01-03 DIAGNOSIS — N186 End stage renal disease: Secondary | ICD-10-CM | POA: Diagnosis not present

## 2018-01-03 DIAGNOSIS — N2581 Secondary hyperparathyroidism of renal origin: Secondary | ICD-10-CM | POA: Diagnosis not present

## 2018-01-05 DIAGNOSIS — D509 Iron deficiency anemia, unspecified: Secondary | ICD-10-CM | POA: Diagnosis not present

## 2018-01-05 DIAGNOSIS — D631 Anemia in chronic kidney disease: Secondary | ICD-10-CM | POA: Diagnosis not present

## 2018-01-05 DIAGNOSIS — N2581 Secondary hyperparathyroidism of renal origin: Secondary | ICD-10-CM | POA: Diagnosis not present

## 2018-01-05 DIAGNOSIS — N186 End stage renal disease: Secondary | ICD-10-CM | POA: Diagnosis not present

## 2018-01-07 DIAGNOSIS — N2581 Secondary hyperparathyroidism of renal origin: Secondary | ICD-10-CM | POA: Diagnosis not present

## 2018-01-07 DIAGNOSIS — D631 Anemia in chronic kidney disease: Secondary | ICD-10-CM | POA: Diagnosis not present

## 2018-01-07 DIAGNOSIS — N186 End stage renal disease: Secondary | ICD-10-CM | POA: Diagnosis not present

## 2018-01-07 DIAGNOSIS — D509 Iron deficiency anemia, unspecified: Secondary | ICD-10-CM | POA: Diagnosis not present

## 2018-01-10 DIAGNOSIS — N186 End stage renal disease: Secondary | ICD-10-CM | POA: Diagnosis not present

## 2018-01-10 DIAGNOSIS — N2581 Secondary hyperparathyroidism of renal origin: Secondary | ICD-10-CM | POA: Diagnosis not present

## 2018-01-10 DIAGNOSIS — D509 Iron deficiency anemia, unspecified: Secondary | ICD-10-CM | POA: Diagnosis not present

## 2018-01-10 DIAGNOSIS — D631 Anemia in chronic kidney disease: Secondary | ICD-10-CM | POA: Diagnosis not present

## 2018-01-12 DIAGNOSIS — N2581 Secondary hyperparathyroidism of renal origin: Secondary | ICD-10-CM | POA: Diagnosis not present

## 2018-01-12 DIAGNOSIS — N186 End stage renal disease: Secondary | ICD-10-CM | POA: Diagnosis not present

## 2018-01-12 DIAGNOSIS — D631 Anemia in chronic kidney disease: Secondary | ICD-10-CM | POA: Diagnosis not present

## 2018-01-12 DIAGNOSIS — D509 Iron deficiency anemia, unspecified: Secondary | ICD-10-CM | POA: Diagnosis not present

## 2018-01-14 DIAGNOSIS — D631 Anemia in chronic kidney disease: Secondary | ICD-10-CM | POA: Diagnosis not present

## 2018-01-14 DIAGNOSIS — D509 Iron deficiency anemia, unspecified: Secondary | ICD-10-CM | POA: Diagnosis not present

## 2018-01-14 DIAGNOSIS — N186 End stage renal disease: Secondary | ICD-10-CM | POA: Diagnosis not present

## 2018-01-14 DIAGNOSIS — N2581 Secondary hyperparathyroidism of renal origin: Secondary | ICD-10-CM | POA: Diagnosis not present

## 2018-01-17 DIAGNOSIS — D509 Iron deficiency anemia, unspecified: Secondary | ICD-10-CM | POA: Diagnosis not present

## 2018-01-17 DIAGNOSIS — N2581 Secondary hyperparathyroidism of renal origin: Secondary | ICD-10-CM | POA: Diagnosis not present

## 2018-01-17 DIAGNOSIS — N186 End stage renal disease: Secondary | ICD-10-CM | POA: Diagnosis not present

## 2018-01-17 DIAGNOSIS — D631 Anemia in chronic kidney disease: Secondary | ICD-10-CM | POA: Diagnosis not present

## 2018-01-19 DIAGNOSIS — D631 Anemia in chronic kidney disease: Secondary | ICD-10-CM | POA: Diagnosis not present

## 2018-01-19 DIAGNOSIS — N186 End stage renal disease: Secondary | ICD-10-CM | POA: Diagnosis not present

## 2018-01-19 DIAGNOSIS — N2581 Secondary hyperparathyroidism of renal origin: Secondary | ICD-10-CM | POA: Diagnosis not present

## 2018-01-19 DIAGNOSIS — D509 Iron deficiency anemia, unspecified: Secondary | ICD-10-CM | POA: Diagnosis not present

## 2018-01-21 DIAGNOSIS — N186 End stage renal disease: Secondary | ICD-10-CM | POA: Diagnosis not present

## 2018-01-21 DIAGNOSIS — D509 Iron deficiency anemia, unspecified: Secondary | ICD-10-CM | POA: Diagnosis not present

## 2018-01-21 DIAGNOSIS — N2581 Secondary hyperparathyroidism of renal origin: Secondary | ICD-10-CM | POA: Diagnosis not present

## 2018-01-21 DIAGNOSIS — D631 Anemia in chronic kidney disease: Secondary | ICD-10-CM | POA: Diagnosis not present

## 2018-01-22 DIAGNOSIS — N186 End stage renal disease: Secondary | ICD-10-CM | POA: Diagnosis not present

## 2018-01-22 DIAGNOSIS — Z992 Dependence on renal dialysis: Secondary | ICD-10-CM | POA: Diagnosis not present

## 2018-01-22 DIAGNOSIS — I129 Hypertensive chronic kidney disease with stage 1 through stage 4 chronic kidney disease, or unspecified chronic kidney disease: Secondary | ICD-10-CM | POA: Diagnosis not present

## 2018-01-24 DIAGNOSIS — D509 Iron deficiency anemia, unspecified: Secondary | ICD-10-CM | POA: Diagnosis not present

## 2018-01-24 DIAGNOSIS — D631 Anemia in chronic kidney disease: Secondary | ICD-10-CM | POA: Diagnosis not present

## 2018-01-24 DIAGNOSIS — N2581 Secondary hyperparathyroidism of renal origin: Secondary | ICD-10-CM | POA: Diagnosis not present

## 2018-01-24 DIAGNOSIS — N186 End stage renal disease: Secondary | ICD-10-CM | POA: Diagnosis not present

## 2018-01-26 DIAGNOSIS — Z888 Allergy status to other drugs, medicaments and biological substances status: Secondary | ICD-10-CM | POA: Diagnosis not present

## 2018-01-26 DIAGNOSIS — Z114 Encounter for screening for human immunodeficiency virus [HIV]: Secondary | ICD-10-CM | POA: Diagnosis not present

## 2018-01-26 DIAGNOSIS — Z1159 Encounter for screening for other viral diseases: Secondary | ICD-10-CM | POA: Diagnosis not present

## 2018-01-26 DIAGNOSIS — Z7682 Awaiting organ transplant status: Secondary | ICD-10-CM | POA: Diagnosis not present

## 2018-01-26 DIAGNOSIS — R768 Other specified abnormal immunological findings in serum: Secondary | ICD-10-CM | POA: Diagnosis not present

## 2018-01-26 DIAGNOSIS — I120 Hypertensive chronic kidney disease with stage 5 chronic kidney disease or end stage renal disease: Secondary | ICD-10-CM | POA: Diagnosis not present

## 2018-01-26 DIAGNOSIS — N186 End stage renal disease: Secondary | ICD-10-CM | POA: Diagnosis not present

## 2018-01-26 DIAGNOSIS — Z992 Dependence on renal dialysis: Secondary | ICD-10-CM | POA: Diagnosis not present

## 2018-01-26 DIAGNOSIS — Z79899 Other long term (current) drug therapy: Secondary | ICD-10-CM | POA: Diagnosis not present

## 2018-01-26 DIAGNOSIS — T8612 Kidney transplant failure: Secondary | ICD-10-CM | POA: Diagnosis not present

## 2018-01-26 MED ORDER — GENERIC EXTERNAL MEDICATION
Status: DC
Start: ? — End: 2018-01-26

## 2018-01-26 MED ORDER — LIDOCAINE HCL 1 % IJ SOLN
0.50 | INTRAMUSCULAR | Status: DC
Start: ? — End: 2018-01-26

## 2018-01-26 MED ORDER — LABETALOL HCL 300 MG PO TABS
600.00 | ORAL_TABLET | ORAL | Status: DC
Start: 2018-01-27 — End: 2018-01-26

## 2018-01-27 DIAGNOSIS — I120 Hypertensive chronic kidney disease with stage 5 chronic kidney disease or end stage renal disease: Secondary | ICD-10-CM | POA: Diagnosis not present

## 2018-01-27 DIAGNOSIS — N186 End stage renal disease: Secondary | ICD-10-CM | POA: Diagnosis not present

## 2018-01-27 DIAGNOSIS — Z7682 Awaiting organ transplant status: Secondary | ICD-10-CM | POA: Diagnosis not present

## 2018-01-27 DIAGNOSIS — Z992 Dependence on renal dialysis: Secondary | ICD-10-CM | POA: Diagnosis not present

## 2018-01-28 DIAGNOSIS — D509 Iron deficiency anemia, unspecified: Secondary | ICD-10-CM | POA: Diagnosis not present

## 2018-01-28 DIAGNOSIS — N186 End stage renal disease: Secondary | ICD-10-CM | POA: Diagnosis not present

## 2018-01-28 DIAGNOSIS — D631 Anemia in chronic kidney disease: Secondary | ICD-10-CM | POA: Diagnosis not present

## 2018-01-28 DIAGNOSIS — N2581 Secondary hyperparathyroidism of renal origin: Secondary | ICD-10-CM | POA: Diagnosis not present

## 2018-01-31 DIAGNOSIS — D509 Iron deficiency anemia, unspecified: Secondary | ICD-10-CM | POA: Diagnosis not present

## 2018-01-31 DIAGNOSIS — D631 Anemia in chronic kidney disease: Secondary | ICD-10-CM | POA: Diagnosis not present

## 2018-01-31 DIAGNOSIS — N186 End stage renal disease: Secondary | ICD-10-CM | POA: Diagnosis not present

## 2018-01-31 DIAGNOSIS — N2581 Secondary hyperparathyroidism of renal origin: Secondary | ICD-10-CM | POA: Diagnosis not present

## 2018-02-02 DIAGNOSIS — D509 Iron deficiency anemia, unspecified: Secondary | ICD-10-CM | POA: Diagnosis not present

## 2018-02-02 DIAGNOSIS — N2581 Secondary hyperparathyroidism of renal origin: Secondary | ICD-10-CM | POA: Diagnosis not present

## 2018-02-02 DIAGNOSIS — N186 End stage renal disease: Secondary | ICD-10-CM | POA: Diagnosis not present

## 2018-02-02 DIAGNOSIS — D631 Anemia in chronic kidney disease: Secondary | ICD-10-CM | POA: Diagnosis not present

## 2018-02-04 DIAGNOSIS — N2581 Secondary hyperparathyroidism of renal origin: Secondary | ICD-10-CM | POA: Diagnosis not present

## 2018-02-04 DIAGNOSIS — D509 Iron deficiency anemia, unspecified: Secondary | ICD-10-CM | POA: Diagnosis not present

## 2018-02-04 DIAGNOSIS — N186 End stage renal disease: Secondary | ICD-10-CM | POA: Diagnosis not present

## 2018-02-04 DIAGNOSIS — D631 Anemia in chronic kidney disease: Secondary | ICD-10-CM | POA: Diagnosis not present

## 2018-02-07 DIAGNOSIS — D509 Iron deficiency anemia, unspecified: Secondary | ICD-10-CM | POA: Diagnosis not present

## 2018-02-07 DIAGNOSIS — N2581 Secondary hyperparathyroidism of renal origin: Secondary | ICD-10-CM | POA: Diagnosis not present

## 2018-02-07 DIAGNOSIS — N186 End stage renal disease: Secondary | ICD-10-CM | POA: Diagnosis not present

## 2018-02-07 DIAGNOSIS — D631 Anemia in chronic kidney disease: Secondary | ICD-10-CM | POA: Diagnosis not present

## 2018-02-09 DIAGNOSIS — N2581 Secondary hyperparathyroidism of renal origin: Secondary | ICD-10-CM | POA: Diagnosis not present

## 2018-02-09 DIAGNOSIS — N186 End stage renal disease: Secondary | ICD-10-CM | POA: Diagnosis not present

## 2018-02-09 DIAGNOSIS — D509 Iron deficiency anemia, unspecified: Secondary | ICD-10-CM | POA: Diagnosis not present

## 2018-02-09 DIAGNOSIS — D631 Anemia in chronic kidney disease: Secondary | ICD-10-CM | POA: Diagnosis not present

## 2018-02-11 DIAGNOSIS — N2581 Secondary hyperparathyroidism of renal origin: Secondary | ICD-10-CM | POA: Diagnosis not present

## 2018-02-11 DIAGNOSIS — D509 Iron deficiency anemia, unspecified: Secondary | ICD-10-CM | POA: Diagnosis not present

## 2018-02-11 DIAGNOSIS — D631 Anemia in chronic kidney disease: Secondary | ICD-10-CM | POA: Diagnosis not present

## 2018-02-11 DIAGNOSIS — N186 End stage renal disease: Secondary | ICD-10-CM | POA: Diagnosis not present

## 2018-02-14 DIAGNOSIS — Z792 Long term (current) use of antibiotics: Secondary | ICD-10-CM | POA: Diagnosis not present

## 2018-02-14 DIAGNOSIS — Z7682 Awaiting organ transplant status: Secondary | ICD-10-CM | POA: Diagnosis not present

## 2018-02-14 DIAGNOSIS — N038 Chronic nephritic syndrome with other morphologic changes: Secondary | ICD-10-CM | POA: Diagnosis not present

## 2018-02-14 DIAGNOSIS — R768 Other specified abnormal immunological findings in serum: Secondary | ICD-10-CM | POA: Diagnosis not present

## 2018-02-14 DIAGNOSIS — I120 Hypertensive chronic kidney disease with stage 5 chronic kidney disease or end stage renal disease: Secondary | ICD-10-CM | POA: Diagnosis not present

## 2018-02-14 DIAGNOSIS — T8612 Kidney transplant failure: Secondary | ICD-10-CM | POA: Diagnosis present

## 2018-02-14 DIAGNOSIS — E8770 Fluid overload, unspecified: Secondary | ICD-10-CM | POA: Diagnosis not present

## 2018-02-14 DIAGNOSIS — Z940 Kidney transplant status: Secondary | ICD-10-CM | POA: Diagnosis not present

## 2018-02-14 DIAGNOSIS — Z960 Presence of urogenital implants: Secondary | ICD-10-CM | POA: Diagnosis not present

## 2018-02-14 DIAGNOSIS — R7881 Bacteremia: Secondary | ICD-10-CM | POA: Diagnosis not present

## 2018-02-14 DIAGNOSIS — D899 Disorder involving the immune mechanism, unspecified: Secondary | ICD-10-CM | POA: Diagnosis not present

## 2018-02-14 DIAGNOSIS — Z992 Dependence on renal dialysis: Secondary | ICD-10-CM | POA: Diagnosis not present

## 2018-02-14 DIAGNOSIS — E8779 Other fluid overload: Secondary | ICD-10-CM | POA: Diagnosis not present

## 2018-02-14 DIAGNOSIS — N186 End stage renal disease: Secondary | ICD-10-CM | POA: Diagnosis not present

## 2018-02-14 DIAGNOSIS — I10 Essential (primary) hypertension: Secondary | ICD-10-CM | POA: Diagnosis not present

## 2018-02-14 DIAGNOSIS — Z005 Encounter for examination of potential donor of organ and tissue: Secondary | ICD-10-CM | POA: Diagnosis not present

## 2018-02-14 DIAGNOSIS — K219 Gastro-esophageal reflux disease without esophagitis: Secondary | ICD-10-CM | POA: Diagnosis present

## 2018-02-14 DIAGNOSIS — A4901 Methicillin susceptible Staphylococcus aureus infection, unspecified site: Secondary | ICD-10-CM | POA: Diagnosis not present

## 2018-02-14 DIAGNOSIS — N039 Chronic nephritic syndrome with unspecified morphologic changes: Secondary | ICD-10-CM | POA: Diagnosis not present

## 2018-02-14 DIAGNOSIS — E875 Hyperkalemia: Secondary | ICD-10-CM | POA: Diagnosis not present

## 2018-02-21 DIAGNOSIS — R7881 Bacteremia: Secondary | ICD-10-CM | POA: Diagnosis not present

## 2018-02-21 DIAGNOSIS — Z005 Encounter for examination of potential donor of organ and tissue: Secondary | ICD-10-CM | POA: Diagnosis not present

## 2018-02-25 DIAGNOSIS — K219 Gastro-esophageal reflux disease without esophagitis: Secondary | ICD-10-CM | POA: Diagnosis not present

## 2018-02-25 DIAGNOSIS — Z940 Kidney transplant status: Secondary | ICD-10-CM | POA: Diagnosis not present

## 2018-02-25 DIAGNOSIS — Z792 Long term (current) use of antibiotics: Secondary | ICD-10-CM | POA: Diagnosis not present

## 2018-02-25 DIAGNOSIS — Z4822 Encounter for aftercare following kidney transplant: Secondary | ICD-10-CM | POA: Diagnosis not present

## 2018-02-25 DIAGNOSIS — D899 Disorder involving the immune mechanism, unspecified: Secondary | ICD-10-CM | POA: Diagnosis not present

## 2018-02-25 DIAGNOSIS — Z79899 Other long term (current) drug therapy: Secondary | ICD-10-CM | POA: Diagnosis not present

## 2018-02-25 DIAGNOSIS — B370 Candidal stomatitis: Secondary | ICD-10-CM | POA: Diagnosis not present

## 2018-02-25 DIAGNOSIS — R7301 Impaired fasting glucose: Secondary | ICD-10-CM | POA: Diagnosis not present

## 2018-02-25 DIAGNOSIS — A4901 Methicillin susceptible Staphylococcus aureus infection, unspecified site: Secondary | ICD-10-CM | POA: Diagnosis not present

## 2018-02-25 DIAGNOSIS — I10 Essential (primary) hypertension: Secondary | ICD-10-CM | POA: Diagnosis not present

## 2018-02-25 DIAGNOSIS — R7881 Bacteremia: Secondary | ICD-10-CM | POA: Diagnosis not present

## 2018-02-25 DIAGNOSIS — Z298 Encounter for other specified prophylactic measures: Secondary | ICD-10-CM | POA: Diagnosis not present

## 2018-02-25 DIAGNOSIS — Z005 Encounter for examination of potential donor of organ and tissue: Secondary | ICD-10-CM | POA: Diagnosis not present

## 2018-02-25 DIAGNOSIS — D649 Anemia, unspecified: Secondary | ICD-10-CM | POA: Diagnosis not present

## 2018-02-25 DIAGNOSIS — Z960 Presence of urogenital implants: Secondary | ICD-10-CM | POA: Diagnosis not present

## 2018-02-28 DIAGNOSIS — Z005 Encounter for examination of potential donor of organ and tissue: Secondary | ICD-10-CM | POA: Diagnosis not present

## 2018-02-28 DIAGNOSIS — R7881 Bacteremia: Secondary | ICD-10-CM | POA: Diagnosis not present

## 2018-03-03 DIAGNOSIS — Z792 Long term (current) use of antibiotics: Secondary | ICD-10-CM | POA: Diagnosis not present

## 2018-03-03 DIAGNOSIS — I10 Essential (primary) hypertension: Secondary | ICD-10-CM | POA: Diagnosis not present

## 2018-03-03 DIAGNOSIS — R7881 Bacteremia: Secondary | ICD-10-CM | POA: Diagnosis not present

## 2018-03-03 DIAGNOSIS — D899 Disorder involving the immune mechanism, unspecified: Secondary | ICD-10-CM | POA: Diagnosis not present

## 2018-03-03 DIAGNOSIS — Z005 Encounter for examination of potential donor of organ and tissue: Secondary | ICD-10-CM | POA: Diagnosis not present

## 2018-03-03 DIAGNOSIS — D72825 Bandemia: Secondary | ICD-10-CM | POA: Diagnosis not present

## 2018-03-03 DIAGNOSIS — Z940 Kidney transplant status: Secondary | ICD-10-CM | POA: Diagnosis not present

## 2018-03-09 ENCOUNTER — Other Ambulatory Visit: Payer: Self-pay | Admitting: Surgery

## 2018-03-09 DIAGNOSIS — Z940 Kidney transplant status: Secondary | ICD-10-CM | POA: Diagnosis not present

## 2018-03-09 DIAGNOSIS — Z1231 Encounter for screening mammogram for malignant neoplasm of breast: Secondary | ICD-10-CM

## 2018-03-18 DIAGNOSIS — D899 Disorder involving the immune mechanism, unspecified: Secondary | ICD-10-CM | POA: Diagnosis not present

## 2018-03-18 DIAGNOSIS — I10 Essential (primary) hypertension: Secondary | ICD-10-CM | POA: Diagnosis not present

## 2018-03-18 DIAGNOSIS — Z792 Long term (current) use of antibiotics: Secondary | ICD-10-CM | POA: Diagnosis not present

## 2018-03-18 DIAGNOSIS — Z940 Kidney transplant status: Secondary | ICD-10-CM | POA: Diagnosis not present

## 2018-03-18 DIAGNOSIS — Z960 Presence of urogenital implants: Secondary | ICD-10-CM | POA: Diagnosis not present

## 2018-03-25 DIAGNOSIS — Z992 Dependence on renal dialysis: Secondary | ICD-10-CM | POA: Diagnosis not present

## 2018-03-25 DIAGNOSIS — Z960 Presence of urogenital implants: Secondary | ICD-10-CM | POA: Diagnosis not present

## 2018-03-25 DIAGNOSIS — Z4822 Encounter for aftercare following kidney transplant: Secondary | ICD-10-CM | POA: Diagnosis not present

## 2018-03-25 DIAGNOSIS — N186 End stage renal disease: Secondary | ICD-10-CM | POA: Diagnosis not present

## 2018-03-25 DIAGNOSIS — Z79899 Other long term (current) drug therapy: Secondary | ICD-10-CM | POA: Diagnosis not present

## 2018-03-25 DIAGNOSIS — I10 Essential (primary) hypertension: Secondary | ICD-10-CM | POA: Diagnosis not present

## 2018-03-25 DIAGNOSIS — I120 Hypertensive chronic kidney disease with stage 5 chronic kidney disease or end stage renal disease: Secondary | ICD-10-CM | POA: Diagnosis not present

## 2018-03-25 DIAGNOSIS — D899 Disorder involving the immune mechanism, unspecified: Secondary | ICD-10-CM | POA: Diagnosis not present

## 2018-03-25 DIAGNOSIS — Z940 Kidney transplant status: Secondary | ICD-10-CM | POA: Diagnosis not present

## 2018-03-25 DIAGNOSIS — F419 Anxiety disorder, unspecified: Secondary | ICD-10-CM | POA: Diagnosis not present

## 2018-04-04 DIAGNOSIS — H52223 Regular astigmatism, bilateral: Secondary | ICD-10-CM | POA: Diagnosis not present

## 2018-04-04 DIAGNOSIS — H5213 Myopia, bilateral: Secondary | ICD-10-CM | POA: Diagnosis not present

## 2018-04-04 DIAGNOSIS — H524 Presbyopia: Secondary | ICD-10-CM | POA: Diagnosis not present

## 2018-04-04 DIAGNOSIS — B259 Cytomegaloviral disease, unspecified: Secondary | ICD-10-CM | POA: Diagnosis not present

## 2018-04-04 DIAGNOSIS — Z5181 Encounter for therapeutic drug level monitoring: Secondary | ICD-10-CM | POA: Diagnosis not present

## 2018-04-04 DIAGNOSIS — Z940 Kidney transplant status: Secondary | ICD-10-CM | POA: Diagnosis not present

## 2018-04-04 DIAGNOSIS — B349 Viral infection, unspecified: Secondary | ICD-10-CM | POA: Diagnosis not present

## 2018-04-07 ENCOUNTER — Ambulatory Visit
Admission: RE | Admit: 2018-04-07 | Discharge: 2018-04-07 | Disposition: A | Discharge status: Home / Self Care | Payer: Medicare Other | Source: Ambulatory Visit | Attending: Surgery | Admitting: Surgery

## 2018-04-07 DIAGNOSIS — Z1231 Encounter for screening mammogram for malignant neoplasm of breast: Secondary | ICD-10-CM

## 2018-04-07 IMAGING — MG DIGITAL SCREENING BILATERAL MAMMOGRAM WITH TOMO AND CAD
8 series · 9 of 24 positions shown · non-contrast
Comparison: Previous exam(s).

CLINICAL DATA: Screening.

EXAM:
DIGITAL SCREENING BILATERAL MAMMOGRAM WITH TOMO AND CAD

[L CC synth-2D]
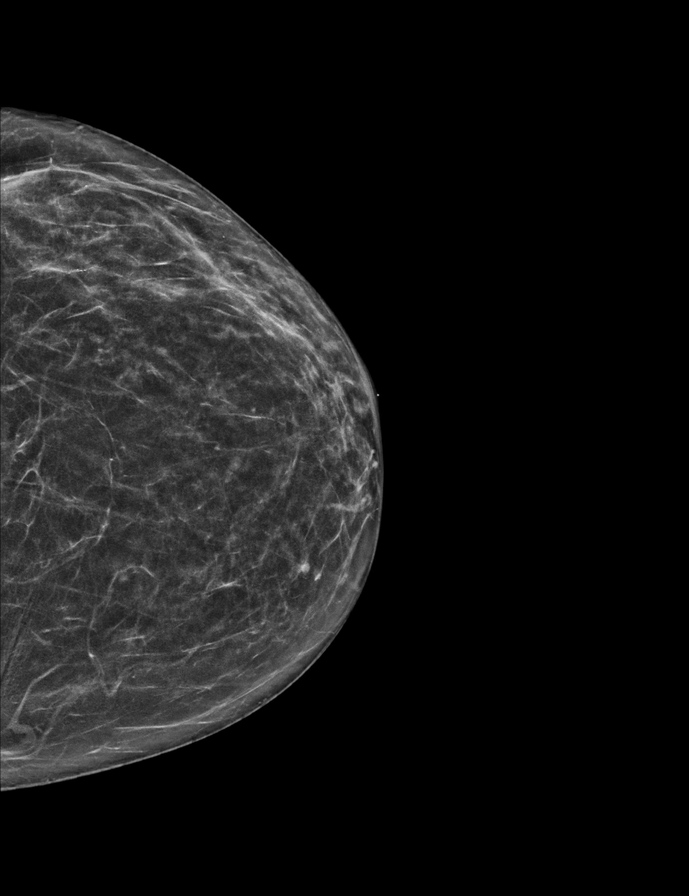

[R CC synth-2D]
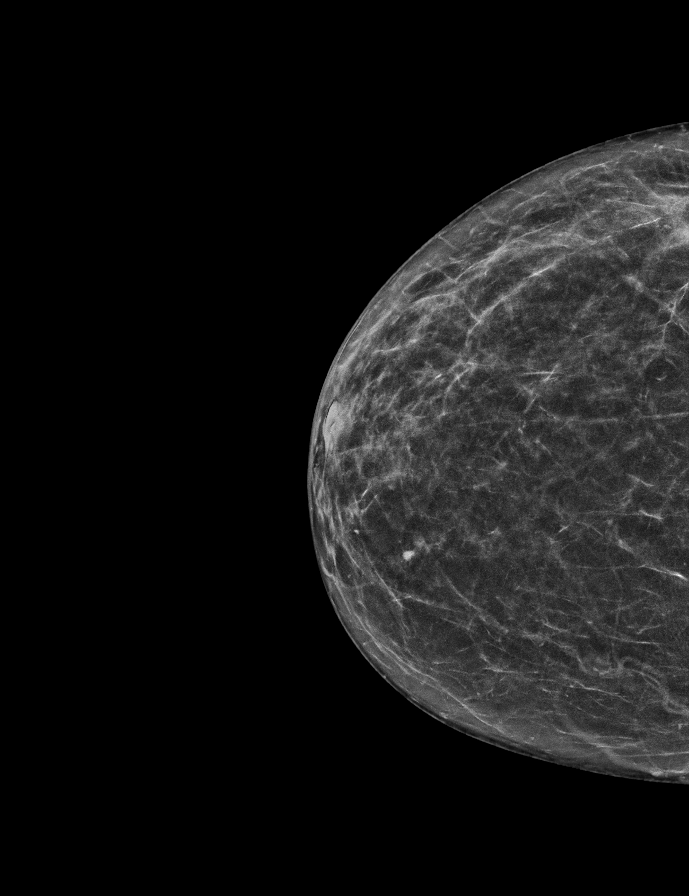

[R MLO synth-2D]
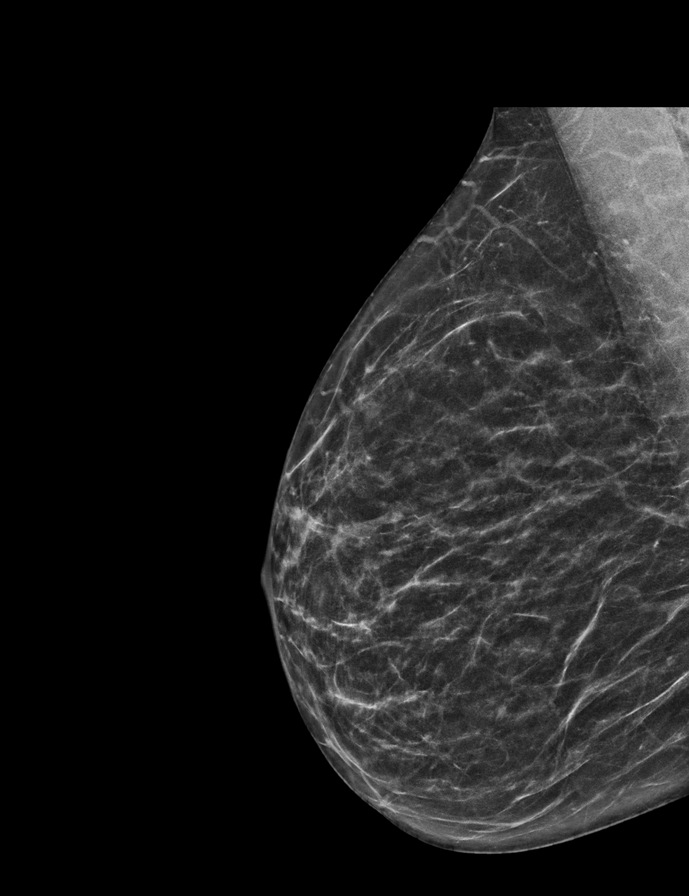

[L MLO synth-2D]
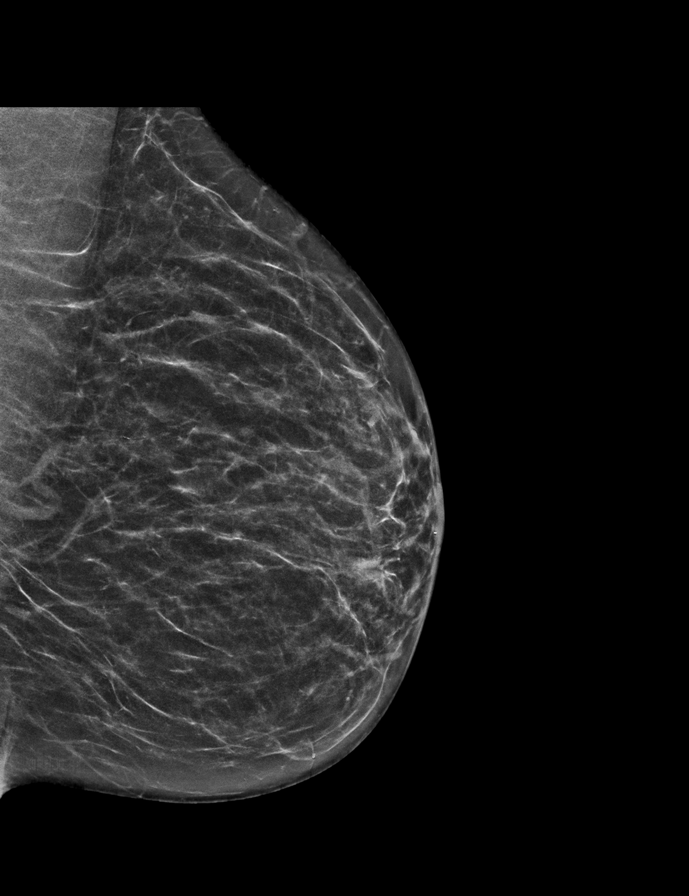

[R CC tomo · 2 of 55 frames shown]
[frame 18/55]
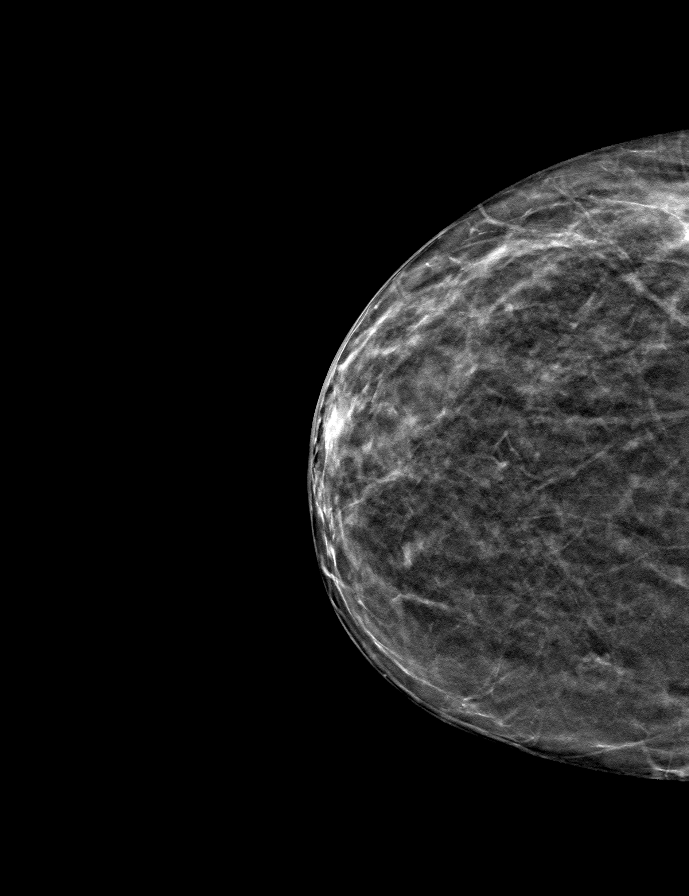
[frame 28/55]
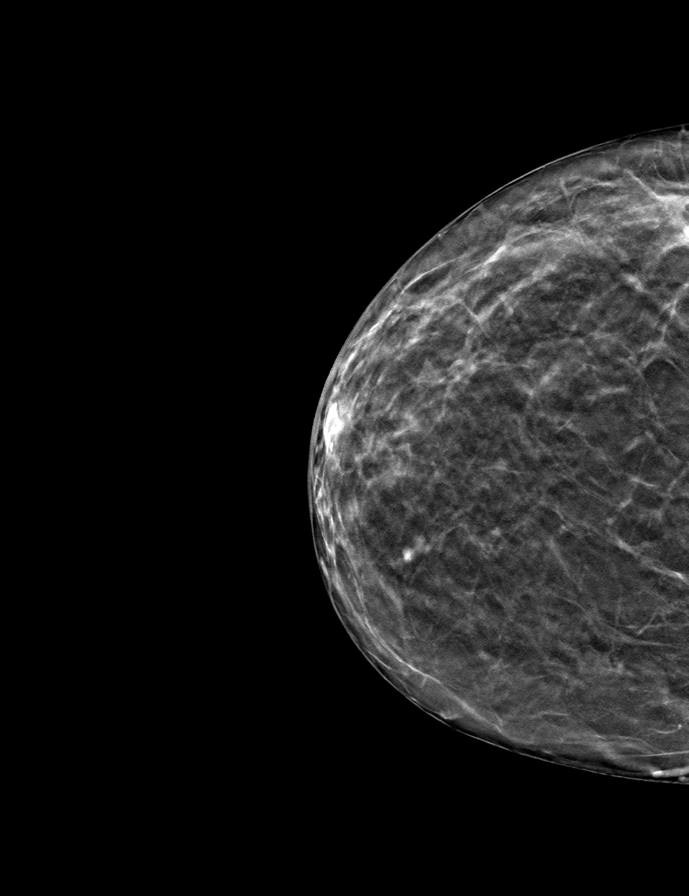

[R MLO tomo · tomo slice 27/54.0]
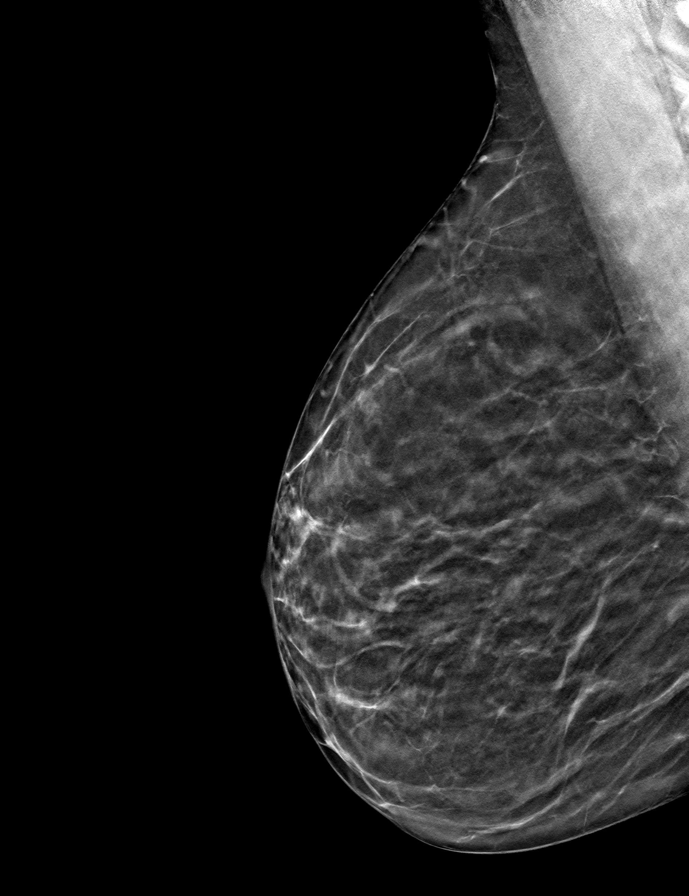

[L CC tomo · tomo slice 28/55.0]
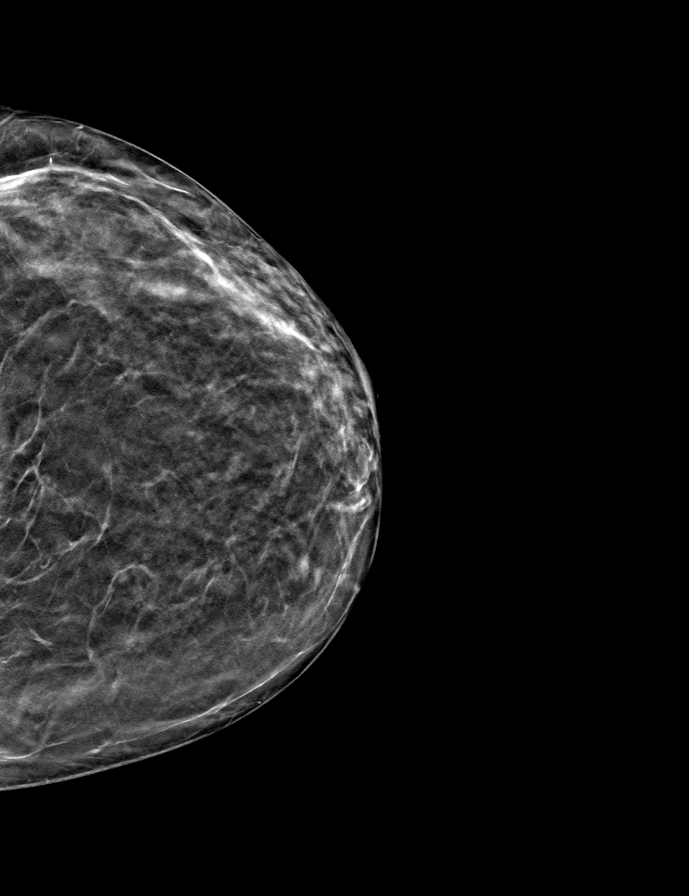

[L MLO tomo · tomo slice 26/51.0]
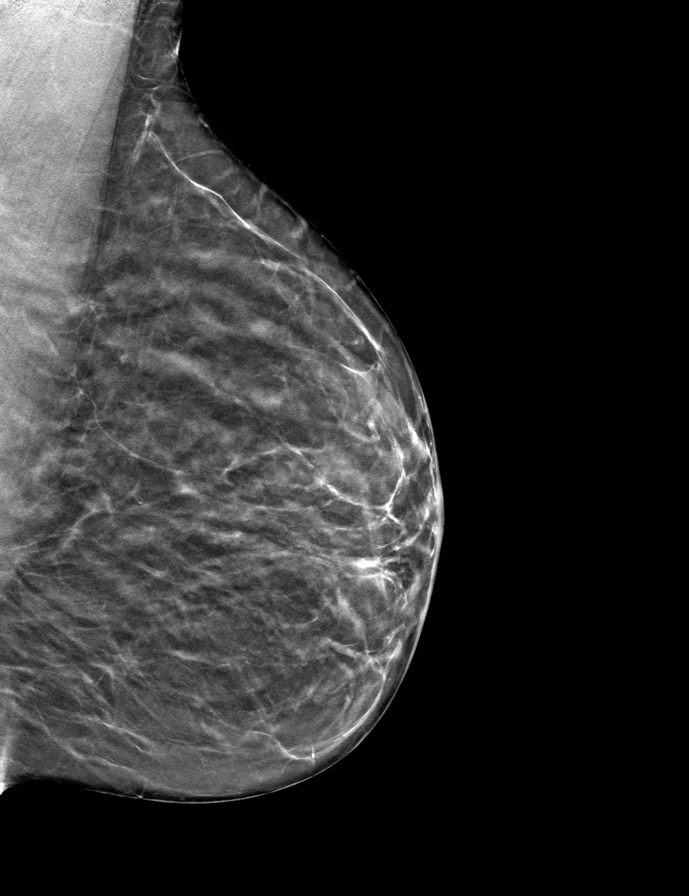

[9 of 24 positions shown; findings below may reference images not displayed]

ACR Breast Density Category b: There are scattered areas of
fibroglandular density.
FINDINGS: There are no findings suspicious for malignancy. Images were
processed with CAD.
IMPRESSION: No mammographic evidence of malignancy. A result letter of this
screening mammogram will be mailed directly to the patient.

RECOMMENDATION:
Screening mammogram in one year. (Code:[TQ])

BI-RADS CATEGORY  1: Negative.

## 2018-04-13 ENCOUNTER — Ambulatory Visit (INDEPENDENT_AMBULATORY_CARE_PROVIDER_SITE_OTHER): Payer: Medicare Other

## 2018-04-13 ENCOUNTER — Encounter (INDEPENDENT_AMBULATORY_CARE_PROVIDER_SITE_OTHER): Payer: Self-pay | Admitting: Family

## 2018-04-13 ENCOUNTER — Ambulatory Visit (INDEPENDENT_AMBULATORY_CARE_PROVIDER_SITE_OTHER): Payer: Medicare Other | Admitting: Family

## 2018-04-13 VITALS — Ht 67.0 in | Wt 142.0 lb

## 2018-04-13 DIAGNOSIS — B259 Cytomegaloviral disease, unspecified: Secondary | ICD-10-CM | POA: Diagnosis not present

## 2018-04-13 DIAGNOSIS — G8929 Other chronic pain: Secondary | ICD-10-CM

## 2018-04-13 DIAGNOSIS — Z9483 Pancreas transplant status: Secondary | ICD-10-CM | POA: Diagnosis not present

## 2018-04-13 DIAGNOSIS — Z5181 Encounter for therapeutic drug level monitoring: Secondary | ICD-10-CM | POA: Diagnosis not present

## 2018-04-13 DIAGNOSIS — Z940 Kidney transplant status: Secondary | ICD-10-CM | POA: Diagnosis not present

## 2018-04-13 DIAGNOSIS — M25561 Pain in right knee: Secondary | ICD-10-CM

## 2018-04-13 DIAGNOSIS — B349 Viral infection, unspecified: Secondary | ICD-10-CM | POA: Diagnosis not present

## 2018-04-13 NOTE — Progress Notes (Signed)
Office Visit Note   Patient: Candace Griffin           Date of Birth: 30-Apr-1976           MRN: [id number] Visit Date: 04/13/2018              Requested by: Anderson, Chelsey L, DO [address], [place] [id number] PCP: Richarda Osmond, DO  Chief Complaint  Patient presents with  . Right Knee - Pain    Hx nondisplaced tibial plateau fx 2017      HPI: The patient is a 42 year old woman who presents today complaining of right knee pain ago she did have a tibial plateau fracture which healed with conservative treatment.  Recently has begun having increasing anterior knee pain this is off and on sometimes at rest sometimes worse with activity.  Stairs seem to aggravate her pain.  Has not had any swelling no erythema.  No injury she can recall.  Wants to make sure everything is okay.  Concerned this may be related to her fracture 2 years ago.  Assessment & Plan: Visit Diagnoses:  1. Chronic pain of right knee     Plan: Reassurance provided.  Does have signs of arthritis however no acute findings.  She will continue with her activities as tolerated May use ice.  May use topicals.  Follow-up in the office as needed discussed possibility of cortisone injection in the future  Follow-Up Instructions: Return in about 4 weeks (around 05/11/2018).   Right Knee Exam   Muscle Strength  The patient has normal right knee strength.  Tenderness  The patient is experiencing no tenderness.   Range of Motion  The patient has normal right knee ROM.  Tests  Varus: negative Valgus: negative  Other  Erythema: absent Swelling: none      Patient is alert, oriented, no adenopathy, well-dressed, normal affect, normal respiratory effort.   Imaging: Xr Knee 1-2 Views Right  Result Date: 04/13/2018 Radiographs of the right knee shows medial joint space narrowing.  This is noted to be equal in the left knee as well.  No spurring.  No acute finding no fracture.  No images  are attached to the encounter.  Labs: Lab Results  Component Value Date   REPTSTATUS 02/04/2015 FINAL 02/03/2015   CULT NO GROWTH Performed at [facility]  02/03/2015   [facility] 03/14/2014     Lab Results  Component Value Date   ALBUMIN 3.6 12/03/2017   ALBUMIN 3.3 (L) 10/17/2015   ALBUMIN 2.8 (L) 07/18/2015    Body mass index is 22.24 kg/m.  Orders:  Orders Placed This Encounter  Procedures  . XR Knee 1-2 Views Right   No orders of the defined types were placed in this encounter.    Procedures: No procedures performed  Clinical Data: No additional findings.  ROS:  All other systems negative, except as noted in the HPI. Review of Systems  Constitutional: Negative for chills and fever.  Cardiovascular: Negative for leg swelling.  Musculoskeletal: Positive for arthralgias and joint swelling.    Objective: Vital Signs: Ht 5\' 7"  (1.702 m)   Wt 142 lb (64.4 kg)   BMI 22.24 kg/m   Specialty Comments:  No specialty comments available.  PMFS History: Patient Active Problem List   Diagnosis Date Noted  . Anemia 12/03/2017  . LLQ abdominal pain 11/17/2017  . Pain in lower back 07/14/2017  . Encounter for routine gynecological examination 02/25/2017  . Essential  hypertension   . Absolute anemia   . AKI (acute kidney injury) ([place]) 09/17/2014  . Acute renal failure ([place]) 09/17/2014  . Low grade squamous intraepithelial lesion (LGSIL) on cervical Pap smear 05/03/2014  . Pyelonephritis 11/02/2013  . Genital warts 07/31/2013  . Atypical squamous cell changes of undetermined significance (ASCUS) on cervical cytology with positive high risk human papilloma virus (HPV) 07/19/2013  . Vaginal discharge 03/01/2013  . Right ankle pain 12/13/2012  . Hypertension 04/21/2012  . Well woman exam 04/21/2012  . History of renal transplant 01/07/2012  . Cerumen impaction 12/29/2011  . Bacterial vaginosis 04/29/2011  . GERD 10/10/2009  . ANEMIA  08/11/2007  . GENITAL HERPES, HX OF 08/11/2007  . End-stage renal disease needing dialysis ([place]) 10/21/2006   Past Medical History:  Diagnosis Date  . Chronic glomerulonephritis   . Dialysis patient ([place])   . End stage renal disease ([place])   . GERD (gastroesophageal reflux disease)   . History of renal transplant Oct 21, 2011  . Hypertension     Family History  Problem Relation Age of Onset  . Diabetes Father   . Diabetes Paternal Aunt   . Diabetes Paternal Grandmother   . Alcohol abuse Paternal Grandmother   . Alcohol abuse Paternal Uncle   . Cancer Maternal Grandmother        breast  . Breast cancer Maternal Grandmother   . Alcohol abuse Paternal Grandfather     Past Surgical History:  Procedure Laterality Date  . COLPOSCOPY  2016  . diaylsis shunt Right    arm  . KIDNEY TRANSPLANT  2011-10-21   deceased donor kidney   Social History   Occupational History    Comment: NA  Tobacco Use  . Smoking status: Never Smoker  . Smokeless tobacco: Never Used  Substance and Sexual Activity  . Alcohol use: No  . Drug use: No  . Sexual activity: Not Currently    Birth control/protection: None

## 2018-04-19 DIAGNOSIS — Z940 Kidney transplant status: Secondary | ICD-10-CM | POA: Diagnosis not present

## 2018-04-19 DIAGNOSIS — Z9483 Pancreas transplant status: Secondary | ICD-10-CM | POA: Diagnosis not present

## 2018-04-19 DIAGNOSIS — B349 Viral infection, unspecified: Secondary | ICD-10-CM | POA: Diagnosis not present

## 2018-04-19 DIAGNOSIS — Z5181 Encounter for therapeutic drug level monitoring: Secondary | ICD-10-CM | POA: Diagnosis not present

## 2018-04-19 DIAGNOSIS — B259 Cytomegaloviral disease, unspecified: Secondary | ICD-10-CM | POA: Diagnosis not present

## 2018-04-29 DIAGNOSIS — Z940 Kidney transplant status: Secondary | ICD-10-CM | POA: Diagnosis not present

## 2018-04-29 DIAGNOSIS — Z5181 Encounter for therapeutic drug level monitoring: Secondary | ICD-10-CM | POA: Diagnosis not present

## 2018-04-29 DIAGNOSIS — Z9483 Pancreas transplant status: Secondary | ICD-10-CM | POA: Diagnosis not present

## 2018-04-29 DIAGNOSIS — B259 Cytomegaloviral disease, unspecified: Secondary | ICD-10-CM | POA: Diagnosis not present

## 2018-04-29 DIAGNOSIS — B349 Viral infection, unspecified: Secondary | ICD-10-CM | POA: Diagnosis not present

## 2018-05-06 DIAGNOSIS — Z940 Kidney transplant status: Secondary | ICD-10-CM | POA: Diagnosis not present

## 2018-05-13 DIAGNOSIS — B259 Cytomegaloviral disease, unspecified: Secondary | ICD-10-CM | POA: Diagnosis not present

## 2018-05-13 DIAGNOSIS — Z940 Kidney transplant status: Secondary | ICD-10-CM | POA: Diagnosis not present

## 2018-05-13 DIAGNOSIS — B349 Viral infection, unspecified: Secondary | ICD-10-CM | POA: Diagnosis not present

## 2018-05-13 DIAGNOSIS — Z5181 Encounter for therapeutic drug level monitoring: Secondary | ICD-10-CM | POA: Diagnosis not present

## 2018-05-13 DIAGNOSIS — Z9483 Pancreas transplant status: Secondary | ICD-10-CM | POA: Diagnosis not present

## 2018-05-16 DIAGNOSIS — Z940 Kidney transplant status: Secondary | ICD-10-CM | POA: Diagnosis not present

## 2018-05-18 DIAGNOSIS — D708 Other neutropenia: Secondary | ICD-10-CM | POA: Diagnosis not present

## 2018-05-18 DIAGNOSIS — Z940 Kidney transplant status: Secondary | ICD-10-CM | POA: Diagnosis not present

## 2018-05-24 DIAGNOSIS — Z5181 Encounter for therapeutic drug level monitoring: Secondary | ICD-10-CM | POA: Diagnosis not present

## 2018-05-24 DIAGNOSIS — B259 Cytomegaloviral disease, unspecified: Secondary | ICD-10-CM | POA: Diagnosis not present

## 2018-05-24 DIAGNOSIS — N390 Urinary tract infection, site not specified: Secondary | ICD-10-CM | POA: Diagnosis not present

## 2018-05-24 DIAGNOSIS — Z940 Kidney transplant status: Secondary | ICD-10-CM | POA: Diagnosis not present

## 2018-05-30 DIAGNOSIS — D708 Other neutropenia: Secondary | ICD-10-CM | POA: Diagnosis not present

## 2018-05-30 DIAGNOSIS — Z940 Kidney transplant status: Secondary | ICD-10-CM | POA: Diagnosis not present

## 2018-06-03 DIAGNOSIS — Z940 Kidney transplant status: Secondary | ICD-10-CM | POA: Diagnosis not present

## 2018-06-10 DIAGNOSIS — Z9483 Pancreas transplant status: Secondary | ICD-10-CM | POA: Diagnosis not present

## 2018-06-10 DIAGNOSIS — B349 Viral infection, unspecified: Secondary | ICD-10-CM | POA: Diagnosis not present

## 2018-06-10 DIAGNOSIS — Z940 Kidney transplant status: Secondary | ICD-10-CM | POA: Diagnosis not present

## 2018-06-10 DIAGNOSIS — B259 Cytomegaloviral disease, unspecified: Secondary | ICD-10-CM | POA: Diagnosis not present

## 2018-06-10 DIAGNOSIS — Z5181 Encounter for therapeutic drug level monitoring: Secondary | ICD-10-CM | POA: Diagnosis not present

## 2018-06-27 DIAGNOSIS — B259 Cytomegaloviral disease, unspecified: Secondary | ICD-10-CM | POA: Diagnosis not present

## 2018-06-27 DIAGNOSIS — Z940 Kidney transplant status: Secondary | ICD-10-CM | POA: Diagnosis not present

## 2018-06-27 DIAGNOSIS — Z9483 Pancreas transplant status: Secondary | ICD-10-CM | POA: Diagnosis not present

## 2018-06-27 DIAGNOSIS — Z5181 Encounter for therapeutic drug level monitoring: Secondary | ICD-10-CM | POA: Diagnosis not present

## 2018-06-27 DIAGNOSIS — B349 Viral infection, unspecified: Secondary | ICD-10-CM | POA: Diagnosis not present

## 2018-07-12 ENCOUNTER — Ambulatory Visit: Payer: Medicare Other | Admitting: Family Medicine

## 2018-07-14 DIAGNOSIS — R52 Pain, unspecified: Secondary | ICD-10-CM | POA: Diagnosis not present

## 2018-07-14 DIAGNOSIS — Z940 Kidney transplant status: Secondary | ICD-10-CM | POA: Diagnosis not present

## 2018-07-20 DIAGNOSIS — B259 Cytomegaloviral disease, unspecified: Secondary | ICD-10-CM | POA: Diagnosis not present

## 2018-08-08 DIAGNOSIS — Z5181 Encounter for therapeutic drug level monitoring: Secondary | ICD-10-CM | POA: Diagnosis not present

## 2018-08-08 DIAGNOSIS — B349 Viral infection, unspecified: Secondary | ICD-10-CM | POA: Diagnosis not present

## 2018-08-08 DIAGNOSIS — B259 Cytomegaloviral disease, unspecified: Secondary | ICD-10-CM | POA: Diagnosis not present

## 2018-08-08 DIAGNOSIS — Z9483 Pancreas transplant status: Secondary | ICD-10-CM | POA: Diagnosis not present

## 2018-08-08 DIAGNOSIS — Z940 Kidney transplant status: Secondary | ICD-10-CM | POA: Diagnosis not present

## 2018-08-19 DIAGNOSIS — Z5181 Encounter for therapeutic drug level monitoring: Secondary | ICD-10-CM | POA: Diagnosis not present

## 2018-08-19 DIAGNOSIS — Z940 Kidney transplant status: Secondary | ICD-10-CM | POA: Diagnosis not present

## 2018-08-19 DIAGNOSIS — B349 Viral infection, unspecified: Secondary | ICD-10-CM | POA: Diagnosis not present

## 2018-08-19 DIAGNOSIS — Z9483 Pancreas transplant status: Secondary | ICD-10-CM | POA: Diagnosis not present

## 2018-08-19 DIAGNOSIS — B259 Cytomegaloviral disease, unspecified: Secondary | ICD-10-CM | POA: Diagnosis not present

## 2018-08-26 ENCOUNTER — Other Ambulatory Visit: Payer: Self-pay

## 2018-08-26 ENCOUNTER — Ambulatory Visit (INDEPENDENT_AMBULATORY_CARE_PROVIDER_SITE_OTHER): Payer: Medicare Other | Admitting: Family Medicine

## 2018-08-26 VITALS — BP 110/64 | Temp 98.7°F | Wt 157.0 lb

## 2018-08-26 DIAGNOSIS — B373 Candidiasis of vulva and vagina: Secondary | ICD-10-CM

## 2018-08-26 DIAGNOSIS — H60591 Other noninfective acute otitis externa, right ear: Secondary | ICD-10-CM

## 2018-08-26 DIAGNOSIS — H6090 Unspecified otitis externa, unspecified ear: Secondary | ICD-10-CM | POA: Insufficient documentation

## 2018-08-26 DIAGNOSIS — B3731 Acute candidiasis of vulva and vagina: Secondary | ICD-10-CM

## 2018-08-26 MED ORDER — NEOMYCIN-POLYMYXIN-HC 3.5-10000-1 OT SOLN: 3.0000 [drp] | Freq: Four times a day (QID) | OTIC | 0 refills | Status: DC

## 2018-08-26 MED ORDER — FLUCONAZOLE 150 MG PO TABS: 150.0000 mg | ORAL_TABLET | Freq: Once | ORAL | 0 refills | Status: AC

## 2018-08-26 NOTE — Assessment & Plan Note (Signed)
Recent treatment for BV, now patient has developed symptoms consistent with yeast vaginitis. Prescribe diflucan 150 mg once.

## 2018-08-26 NOTE — Progress Notes (Signed)
   Subjective:    Patient ID: Candace Griffin, female    DOB: 06-25-1976, 43 y.o.   MRN: [id number]   CC: Right ear pain   HPI: Patient a 43 yo female who presents today complaining of right ear pain. Patient states that she was cleaning her ears on Monday when she experienced some pain after she thought she went too deep. She started to have worsening ear pain the next two days for which she took some tylenol with mild relief. Patient reports pain was pretty severe on Wednesday preventing her from sleeping. She has bought OTC ear drop but has not used it yet. She has had URI symptoms with some sinus congestion in the past few days. Patient here to make sure she did not have an ear infection, She denies any fever, nausea, vomiting, headaches. Patient also reports vaginal itching after recent antibiotic treatment for BV. Denies any discharge or vaginal bleeding.  Smoking status reviewed   ROS: all other systems were reviewed and are negative other than in the HPI   Past Medical History:  Diagnosis Date  . Chronic glomerulonephritis   . Dialysis patient ([place])   . End stage renal disease ([place])   . GERD (gastroesophageal reflux disease)   . History of renal transplant Oct 20, 2011  . Hypertension     Past Surgical History:  Procedure Laterality Date  . COLPOSCOPY  2016  . diaylsis shunt Right    arm  . KIDNEY TRANSPLANT  2011/10/20   deceased donor kidney    Past medical history, surgical, family, and social history reviewed and updated in the EMR as appropriate.  Objective:  BP 110/64   Temp 98.7 F (37.1 C) (Oral)   Wt 157 lb (71.2 kg)   BMI 24.59 kg/m   Vitals and nursing note reviewed  General: NAD, pleasant, able to participate in exam HEENT: Right ear canal with white exudates and mild erythema, TM not bulging, slightly erythematous, without any fluid. Left ear exam within normal limits. Cardiac: RRR, normal heart sounds, no murmurs. 2+ radial and PT pulses  bilaterally Respiratory: CTAB, normal effort, No wheezes, rales or rhonchi Abdomen: soft, nontender, nondistended, no hepatic or splenomegaly, +BS Extremities: no edema or cyanosis. WWP. Skin: warm and dry, no rashes noted Neuro: alert and oriented x4, no focal deficits Psych: Normal affect and mood   Assessment & Plan:   Otitis externa Patient present with right ear pain for the past 4 days. Ear exam with exudates and erythema in the canal. TM non bulging and only mildly erythematous, no fluid noted. Findings and presentation most consistent with otitis externa and less concerning for otitis media despite recent congestion from URI. Will treat with corticosporin otic solution.  Yeast vaginitis Recent treatment for BV, now patient has developed symptoms consistent with yeast vaginitis. Prescribe diflucan 150 mg once.    Marjie Skiff, MD [place] PGY-3

## 2018-08-26 NOTE — Patient Instructions (Signed)
Otitis Externa    Otitis externa is an infection of the outer ear canal. The outer ear canal is the area between the outside of the ear and the eardrum. Otitis externa is sometimes called swimmer's ear.  What are the causes?  Common causes of this condition include:   Swimming in dirty water.   Moisture in the ear.   An injury to the inside of the ear.   An object stuck in the ear.   A cut or scrape on the outside of the ear.  What increases the risk?  You are more likely to develop this condition if you go swimming often.  What are the signs or symptoms?  The first symptom of this condition is often itching in the ear. Later symptoms of the condition include:   Swelling of the ear.   Redness in the ear.   Ear pain. The pain may get worse when you pull on your ear.   Pus coming from the ear.  How is this diagnosed?  This condition may be diagnosed by examining the ear and testing fluid from the ear for bacteria and funguses.  How is this treated?  This condition may be treated with:   Antibiotic ear drops. These are often given for 10-14 days.   Medicines to reduce itching and swelling.  Follow these instructions at home:   If you were prescribed antibiotic ear drops, use them as told by your health care provider. Do not stop using the antibiotic even if your condition improves.   Take over-the-counter and prescription medicines only as told by your health care provider.   Avoid getting water in your ears as told by your health care provider. This may include avoiding swimming or water sports for a few days.   Keep all follow-up visits as told by your health care provider. This is important.  How is this prevented?   Keep your ears dry. Use the corner of a towel to dry your ears after you swim or bathe.   Avoid scratching or putting things in your ear. Doing these things can damage the ear canal or remove the protective wax that lines it, which makes it easier for bacteria and funguses to  grow.   Avoid swimming in lakes, polluted water, or pools that may not have enough chlorine.  Contact a health care provider if:   You have a fever.   Your ear is still red, swollen, painful, or draining pus after 3 days.   Your redness, swelling, or pain gets worse.   You have a severe headache.   You have redness, swelling, pain, or tenderness in the area behind your ear.  Summary   Otitis externa is an infection of the outer ear canal.   Common causes include swimming in dirty water, moisture in the ear, or a cut or scrape in the ear.   Symptoms include pain, redness, and swelling of the ear.   If you were prescribed antibiotic ear drops, use them as told by your health care provider. Do not stop using the antibiotic even if your condition improves.  This information is not intended to replace advice given to you by your health care provider. Make sure you discuss any questions you have with your health care provider.  Document Released: 08/10/2005 Document Revised: 01/14/2018 Document Reviewed: 01/14/2018  Elsevier Interactive Patient Education  2019 [employer] Inc[employer]

## 2018-08-26 NOTE — Assessment & Plan Note (Signed)
Patient present with right ear pain for the past 4 days. Ear exam with exudates and erythema in the canal. TM non bulging and only mildly erythematous, no fluid noted. Findings and presentation most consistent with otitis externa and less concerning for otitis media despite recent congestion from URI. Will treat with corticosporin otic solution.

## 2018-09-05 ENCOUNTER — Ambulatory Visit (INDEPENDENT_AMBULATORY_CARE_PROVIDER_SITE_OTHER): Payer: Medicare Other | Admitting: Family Medicine

## 2018-09-05 ENCOUNTER — Other Ambulatory Visit: Payer: Self-pay

## 2018-09-05 VITALS — BP 118/48 | HR 94 | Temp 98.5°F | Wt 157.0 lb

## 2018-09-05 DIAGNOSIS — H6501 Acute serous otitis media, right ear: Secondary | ICD-10-CM

## 2018-09-05 MED ORDER — AMOXICILLIN 500 MG PO TABS: 500.0000 mg | ORAL_TABLET | Freq: Two times a day (BID) | ORAL | 0 refills | Status: AC

## 2018-09-05 NOTE — Progress Notes (Signed)
    Subjective:    Patient ID: Candace Griffin, female    DOB: Jan 05, 1976, 43 y.o.   MRN: [id number]   CC: r ear clogged after using drops  Patient reporting her right ear is clogged. Was using ear drops prescribed at last visit but felt they made things worse. Put cotton ball in ear to keep liquid in. Reports hearing loss on right side totally for past 3-4 days. The ear does not hurt at all, just feels clogged. She had a viral URI about 2 weeks ago and has a lingering cough. No sinus pain or pressure. She is having headaches. She denies fevers or chills. She also endorses fatigue. Denies poor appetite, nausea, vomiting, diarrhea.   Smoking status reviewed- non-smoker  Review of Systems- see HPI   Objective:  BP (!) 118/48   Pulse 94   Temp 98.5 F (36.9 C) (Oral)   Wt 157 lb (71.2 kg)   LMP 08/06/2018   SpO2 98%   BMI 24.59 kg/m  Vitals and nursing note reviewed  General: well nourished, in no acute distress HEENT: normocephalic, MMM. Right TM opaque, yellow, dull appearance. Left TM normal. No sinus tenderness. Neck: supple, non-tender, without lymphadenopathy Cardiac: RRR, clear S1 and S2, no murmurs, rubs, or gallops Respiratory: clear to auscultation bilaterally, no increased work of breathing Neuro: alert and oriented, no focal deficits   Assessment & Plan:    1. Non-recurrent acute serous otitis media of right ear Concerning appearance of TM. Given patient is immunocompromised after kidney transplant would err on the side of treatment. Will give course of amoxicillin. Discontinue ear drops as external ear canal is normal in appearance. Follow up as needed   Return if symptoms worsen or fail to improve.   Lucila Maine, DO Family Medicine Resident PGY-3

## 2018-09-05 NOTE — Patient Instructions (Signed)
Please take antibiotic twice a day I would advise taking a probiotic supplement as well- like yogurt or an actual probiotic pill   Add in sudafed (decongestant) OR an antihistamine (like zyrtec, allegra, claritin) daily   If you have questions or concerns please do not hesitate to call at [phone].  Lucila Maine, DO PGY-3, [facility] 09/05/2018 10:15 AM   Eustachian Tube Dysfunction  Eustachian tube dysfunction refers to a condition in which a blockage develops in the narrow passage that connects the middle ear to the back of the nose (eustachian tube). The eustachian tube regulates air pressure in the middle ear by letting air move between the ear and nose. It also helps to drain fluid from the middle ear space. Eustachian tube dysfunction can affect one or both ears. When the eustachian tube does not function properly, air pressure, fluid, or both can build up in the middle ear. What are the causes? This condition occurs when the eustachian tube becomes blocked or cannot open normally. Common causes of this condition include:  Ear infections.  Colds and other infections that affect the nose, mouth, and throat (upper respiratory tract).  Allergies.  Irritation from cigarette smoke.  Irritation from stomach acid coming up into the esophagus (gastroesophageal reflux). The esophagus is the tube that carries food from the mouth to the stomach.  Sudden changes in air pressure, such as from descending in an airplane or scuba diving.  Abnormal growths in the nose or throat, such as: ? Growths that line the nose (nasal polyps). ? Abnormal growth of cells (tumors). ? Enlarged tissue at the back of the throat (adenoids). What increases the risk? You are more likely to develop this condition if:  You smoke.  You are overweight.  You are a child who has: ? Certain birth defects of the mouth, such as cleft palate. ? Large tonsils or adenoids. What are the signs  or symptoms? Common symptoms of this condition include:  A feeling of fullness in the ear.  Ear pain.  Clicking or popping noises in the ear.  Ringing in the ear.  Hearing loss.  Loss of balance.  Dizziness. Symptoms may get worse when the air pressure around you changes, such as when you travel to an area of high elevation, fly on an airplane, or go scuba diving. How is this diagnosed? This condition may be diagnosed based on:  Your symptoms.  A physical exam of your ears, nose, and throat.  Tests, such as those that measure: ? The movement of your eardrum (tympanogram). ? Your hearing (audiometry). How is this treated? Treatment depends on the cause and severity of your condition.  In mild cases, you may relieve your symptoms by moving air into your ears. This is called "popping the ears."  In more severe cases, or if you have symptoms of fluid in your ears, treatment may include: ? Medicines to relieve congestion (decongestants). ? Medicines that treat allergies (antihistamines). ? Nasal sprays or ear drops that contain medicines that reduce swelling (steroids). ? A procedure to drain the fluid in your eardrum (myringotomy). In this procedure, a small tube is placed in the eardrum to:  Drain the fluid.  Restore the air in the middle ear space. ? A procedure to insert a balloon device through the nose to inflate the opening of the eustachian tube (balloon dilation). Follow these instructions at home: Lifestyle  Do not do any of the following until your health care provider approves: ? Travel  to high altitudes. ? Fly in airplanes. ? Work in a Pension scheme manager or room. ? Scuba dive.  Do not use any products that contain nicotine or tobacco, such as cigarettes and e-cigarettes. If you need help quitting, ask your health care provider.  Keep your ears dry. Wear fitted earplugs during showering and bathing. Dry your ears completely after. General  instructions  Take over-the-counter and prescription medicines only as told by your health care provider.  Use techniques to help pop your ears as recommended by your health care provider. These may include: ? Chewing gum. ? Yawning. ? Frequent, forceful swallowing. ? Closing your mouth, holding your nose closed, and gently blowing as if you are trying to blow air out of your nose.  Keep all follow-up visits as told by your health care provider. This is important. Contact a health care provider if:  Your symptoms do not go away after treatment.  Your symptoms come back after treatment.  You are unable to pop your ears.  You have: ? A fever. ? Pain in your ear. ? Pain in your head or neck. ? Fluid draining from your ear.  Your hearing suddenly changes.  You become very dizzy.  You lose your balance. Summary  Eustachian tube dysfunction refers to a condition in which a blockage develops in the eustachian tube.  It can be caused by ear infections, allergies, inhaled irritants, or abnormal growths in the nose or throat.  Symptoms include ear pain, hearing loss, or ringing in the ears.  Mild cases are treated with maneuvers to unblock the ears, such as yawning or ear popping.  Severe cases are treated with medicines. Surgery may also be done (rare). This information is not intended to replace advice given to you by your health care provider. Make sure you discuss any questions you have with your health care provider. Document Released: 09/06/2015 Document Revised: 11/30/2017 Document Reviewed: 11/30/2017 Elsevier Interactive Patient Education  2019 [employer].

## 2018-11-01 ENCOUNTER — Ambulatory Visit (INDEPENDENT_AMBULATORY_CARE_PROVIDER_SITE_OTHER): Payer: Medicare Other | Admitting: Family Medicine

## 2018-11-01 ENCOUNTER — Other Ambulatory Visit: Payer: Self-pay

## 2018-11-01 VITALS — BP 110/74 | HR 79 | Temp 98.1°F | Wt 165.8 lb

## 2018-11-01 DIAGNOSIS — R300 Dysuria: Secondary | ICD-10-CM | POA: Diagnosis not present

## 2018-11-01 LAB — POCT URINALYSIS DIP (MANUAL ENTRY)
Bilirubin, UA: NEGATIVE
Blood, UA: NEGATIVE
Glucose, UA: NEGATIVE mg/dL
Ketones, POC UA: NEGATIVE mg/dL
Leukocytes, UA: NEGATIVE
Nitrite, UA: NEGATIVE
Protein Ur, POC: NEGATIVE mg/dL
Spec Grav, UA: 1.015 (ref 1.010–1.025)
Urobilinogen, UA: 0.2 E.U./dL
pH, UA: 6.5 (ref 5.0–8.0)

## 2018-11-01 LAB — POCT WET PREP (WET MOUNT)
Clue Cells Wet Prep Whiff POC: NEGATIVE
Trichomonas Wet Prep HPF POC: ABSENT

## 2018-11-01 NOTE — Progress Notes (Signed)
   Subjective:    Candace Griffin - 43 y.o. female MRN [id number]  Date of birth: April 08, 1976  CC:  Candace Griffin is here for urinary symptoms.  HPI: - was diagnosed with chronic glomerulonephritis as a teen - has been on dialysis off and on for decades and has had one failed kidney transplant before getting a second transplant in June 2019 which so far has been working well - has some hesitancy with urination, which will happen more at night - more discharge than normal but is not different otherwise - had dysuria some days ago, but this has improved - no urgency - has had frequent UTIs since her transplant and worries that she currently has a UTI - recently completed a course of Bactrim for a previous UTI - also reports some vulvar irritation  - is wondering if her soap is irritating her vulva and currently uses four different soaps at a time - is not currently sexually active  Health Maintenance:  Health Maintenance Due  Topic Date Due  . TETANUS/TDAP  10/26/1995    -  reports that she has never smoked. She has never used smokeless tobacco. - Review of Systems: Per HPI. - Past Medical History: Patient Active Problem List   Diagnosis Date Noted  . Yeast vaginitis 08/26/2018  . Otitis externa 08/26/2018  . Anemia 12/03/2017  . LLQ abdominal pain 11/17/2017  . Pain in lower back 07/14/2017  . Encounter for routine gynecological examination 02/25/2017  . Essential hypertension   . Absolute anemia   . AKI (acute kidney injury) ([place]) 09/17/2014  . Acute renal failure ([place]) 09/17/2014  . Low grade squamous intraepithelial lesion (LGSIL) on cervical Pap smear 05/03/2014  . Pyelonephritis 11/02/2013  . Genital warts 07/31/2013  . Atypical squamous cell changes of undetermined significance (ASCUS) on cervical cytology with positive high risk human papilloma virus (HPV) 07/19/2013  . Vaginal discharge 03/01/2013  . Right ankle pain 12/13/2012  . Hypertension 04/21/2012    . Well woman exam 04/21/2012  . History of renal transplant 01/07/2012  . Cerumen impaction 12/29/2011  . Bacterial vaginosis 04/29/2011  . Dysuria 10/16/2010  . GERD 10/10/2009  . ANEMIA 08/11/2007  . GENITAL HERPES, HX OF 08/11/2007  . End-stage renal disease needing dialysis ([place]) 10/21/2006   - Medications: reviewed and updated   Objective:   Physical Exam BP 110/74   Pulse 79   Temp 98.1 F (36.7 C) (Oral)   Wt 165 lb 12.8 oz (75.2 kg)   SpO2 99%   BMI 25.97 kg/m  Gen: NAD, alert, cooperative with exam, well-appearing CV: RRR, good S1/S2, no murmur, no edema Resp: CTABL, no wheezes, non-labored Abd: no suprapubic or CVA tenderness GU: normal vulva, vagina, and cervix.  Normal appearing discharge  Psych: good insight, alert and oriented        Assessment & Plan:   Dysuria UA and wet prep are negative, making an infectious cause of patient's urinary and vulvar symptoms unlikely.  Patient was counseled that since she takes immunosuppressive medications, she is at a higher risk of UTIs and other infections, so she should be seen if her urinary symptoms worsen or if she develops fevers, chills, malaise, or nausea.  I advised her to use one type of soap at a time to see if her vulvar irritation improves or worsens in response to a particular agent.      Maia Breslow, M.D. 11/02/2018, 10:35 AM PGY-2, [facility]

## 2018-11-01 NOTE — Patient Instructions (Addendum)
It was nice meeting you today Candace Griffin!  The results today were negative, so you do not have an infectious cause for your symptoms.  You can try using Azo UTI medication over-the-counter if you are concerned about continuing symptoms, but if you have fevers, chills, nausea, or lack of appetite, please see a doctor since these are signs concerning for an infection.  Feel free to get testing in the near future if you continue to have urinary symptoms.  If you have any questions or concerns, please feel free to call the clinic.   Be well,  Dr. Shan Levans

## 2018-11-02 NOTE — Assessment & Plan Note (Signed)
UA and wet prep are negative, making an infectious cause of patient's urinary and vulvar symptoms unlikely.  Patient was counseled that since she takes immunosuppressive medications, she is at a higher risk of UTIs and other infections, so she should be seen if her urinary symptoms worsen or if she develops fevers, chills, malaise, or nausea.  I advised her to use one type of soap at a time to see if her vulvar irritation improves or worsens in response to a particular agent.

## 2018-11-17 ENCOUNTER — Other Ambulatory Visit: Payer: Self-pay | Admitting: Family Medicine

## 2018-11-17 DIAGNOSIS — N898 Other specified noninflammatory disorders of vagina: Secondary | ICD-10-CM

## 2018-12-15 ENCOUNTER — Telehealth: Payer: Self-pay | Admitting: Licensed Clinical Social Worker

## 2018-12-15 ENCOUNTER — Encounter: Payer: Self-pay | Admitting: Licensed Clinical Social Worker

## 2018-12-15 NOTE — Telephone Encounter (Signed)
  Care Coordination  Telephone Outreach Note  12/15/2018 Name: ORIEL OJO      MRN: [id number]          DOB: 04/02/1976  Referred by: patient's health plan. Reason for referral : Care Coordination (outreach call) I reached out to Ms. Flonnie Hailstone today by phone in response to a referral sent by Ms. Vivien Rossetti Sholtz's health plan.  Review of patient status, including review of consultants notes from with appropriate care team members was performed as part of provision for care coordination referral.   Report of symptoms/concerns: no concerns reported  ASSESSMENT: KASIA TREGO is a 43 y.o. year old female who sees Richarda Osmond, DO for primary care. Patient is pleasant and engaged in conversation. Reports she is doing well, has all medication and no needs at this time. Had virtual visit with kidney doctor this week.    INTERVENTION: Client interviewed and appropriate assessments performed. Aalso utilized engagement as part of my intervention.  Other interventions include: assessing for SDOH (Social Determinants of Health).  challenges identified: None  Follow up plan: 1. Patient will call Family Medicine if needed 2. No additional F/U needed by LCSW at this time.  Casimer Lanius, LCSW [facility]   [phone] 2:20 PM

## 2019-01-14 ENCOUNTER — Emergency Department (HOSPITAL_COMMUNITY)
Admission: EM | Admit: 2019-01-14 | Discharge: 2019-01-14 | Disposition: A | Discharge status: Home / Self Care | Payer: Medicare Other | Attending: Emergency Medicine | Admitting: Emergency Medicine

## 2019-01-14 ENCOUNTER — Other Ambulatory Visit: Payer: Self-pay

## 2019-01-14 ENCOUNTER — Encounter (HOSPITAL_COMMUNITY): Payer: Self-pay

## 2019-01-14 DIAGNOSIS — Z7982 Long term (current) use of aspirin: Secondary | ICD-10-CM | POA: Insufficient documentation

## 2019-01-14 DIAGNOSIS — Z79899 Other long term (current) drug therapy: Secondary | ICD-10-CM | POA: Diagnosis not present

## 2019-01-14 DIAGNOSIS — M5441 Lumbago with sciatica, right side: Secondary | ICD-10-CM | POA: Diagnosis not present

## 2019-01-14 DIAGNOSIS — M545 Low back pain, unspecified: Secondary | ICD-10-CM

## 2019-01-14 DIAGNOSIS — M5442 Lumbago with sciatica, left side: Secondary | ICD-10-CM | POA: Diagnosis not present

## 2019-01-14 DIAGNOSIS — Z940 Kidney transplant status: Secondary | ICD-10-CM | POA: Insufficient documentation

## 2019-01-14 DIAGNOSIS — N898 Other specified noninflammatory disorders of vagina: Secondary | ICD-10-CM | POA: Diagnosis not present

## 2019-01-14 LAB — CBC
HCT: 39 % (ref 36.0–46.0)
Hemoglobin: 12.6 g/dL (ref 12.0–15.0)
MCH: 31.4 pg (ref 26.0–34.0)
MCHC: 32.3 g/dL (ref 30.0–36.0)
MCV: 97.3 fL (ref 80.0–100.0)
Platelets: 186 10*3/uL (ref 150–400)
RBC: 4.01 MIL/uL (ref 3.87–5.11)
RDW: 12.1 % (ref 11.5–15.5)
WBC: 6.7 10*3/uL (ref 4.0–10.5)
nRBC: 0 % (ref 0.0–0.2)

## 2019-01-14 LAB — BASIC METABOLIC PANEL
Anion gap: 10 (ref 5–15)
BUN: 9 mg/dL (ref 6–20)
CO2: 23 mmol/L (ref 22–32)
Calcium: 9.3 mg/dL (ref 8.9–10.3)
Chloride: 104 mmol/L (ref 98–111)
Creatinine, Ser: 1.39 mg/dL — ABNORMAL HIGH (ref 0.44–1.00)
GFR calc Af Amer: 54 mL/min — ABNORMAL LOW (ref >60)
GFR calc non Af Amer: 47 mL/min — ABNORMAL LOW (ref >60)
Glucose, Bld: 100 mg/dL — ABNORMAL HIGH (ref 70–99)
Potassium: 4.1 mmol/L (ref 3.5–5.1)
Sodium: 137 mmol/L (ref 135–145)

## 2019-01-14 LAB — URINALYSIS, ROUTINE W REFLEX MICROSCOPIC
Bilirubin Urine: NEGATIVE
Glucose, UA: NEGATIVE mg/dL
Hgb urine dipstick: NEGATIVE
Ketones, ur: NEGATIVE mg/dL
Leukocytes,Ua: NEGATIVE
Nitrite: NEGATIVE
Protein, ur: NEGATIVE mg/dL
Specific Gravity, Urine: 1.005 (ref 1.005–1.030)
pH: 6 (ref 5.0–8.0)

## 2019-01-14 LAB — PREGNANCY, URINE: Preg Test, Ur: NEGATIVE

## 2019-01-14 MED ORDER — CYCLOBENZAPRINE HCL 10 MG PO TABS: 10.0000 mg | ORAL_TABLET | Freq: Two times a day (BID) | ORAL | 0 refills | Status: DC | PRN

## 2019-01-14 MED ORDER — FLUCONAZOLE 150 MG PO TABS: 150.0000 mg | ORAL_TABLET | Freq: Once | ORAL | 0 refills | Status: AC

## 2019-01-14 MED ORDER — METRONIDAZOLE 500 MG PO TABS: 500.0000 mg | ORAL_TABLET | Freq: Two times a day (BID) | ORAL | 0 refills | Status: DC

## 2019-01-14 MED ORDER — LIDOCAINE 5 % EX PTCH: 1.0000 | MEDICATED_PATCH | CUTANEOUS | 0 refills | Status: DC

## 2019-01-14 NOTE — ED Triage Notes (Signed)
Pt reports that she had a kidney transplant one year at [facility], pt repots that since last night she has been having severe back pain, pt called her coordinator and told to be evaluated and have urine culture, pt also reports vaginal discharge.

## 2019-01-14 NOTE — ED Provider Notes (Signed)
[facility]ERGENC[facility] DEPARTMENT Provider Note   CSN: [id number] Arrival date & time: 01/14/19  2027    History   Chief Complaint Chief Complaint  Patient presents with  . Back Pain    HPI Candace Griffin is a 43 y.o. female.     Patient presents to the emergency department with a chief complaint of low back pain.  She states pain started yesterday.  She reports lifting her 85-year-old family member, and may have strained her back.  She is a renal transplant patient, and was advised by her transplant coordinator to come to the emergency department to ensure that she did not have any kidney related problems causing her back pain.  She denies any dysuria or hematuria.  Denies any fevers or chills.  Denies any nausea or vomiting.  She has tried taking Tylenol with some mild relief.  Her symptoms are worsened with movement and palpation.  No bowel or bladder incontinence.  Denies any saddle anesthesia.  Denies any numbness, weakness, or tingling.  Additionally, patient reports having had foul smelling vaginal discharge for a while.  States she has recurrent BV infections.  Was treat for yeast by PCP, but patient fairly convinced she has BV.  Not sexually active.  The history is provided by the patient. No language interpreter was used.    Past Medical History:  Diagnosis Date  . Chronic glomerulonephritis   . Dialysis patient ([place])   . End stage renal disease ([place])   . GERD (gastroesophageal reflux disease)   . History of renal transplant 10/14/2011  . Hypertension     Patient Active Problem List   Diagnosis Date Noted  . Yeast vaginitis 08/26/2018  . Otitis externa 08/26/2018  . Anemia 12/03/2017  . LLQ abdominal pain 11/17/2017  . Pain in lower back 07/14/2017  . Encounter for routine gynecological examination 02/25/2017  . Essential hypertension   . Absolute anemia   . AKI (acute kidney injury) ([place]) 09/17/2014  . Acute renal failure ([place]) 09/17/2014   . Low grade squamous intraepithelial lesion (LGSIL) on cervical Pap smear 05/03/2014  . Pyelonephritis 11/02/2013  . Genital warts 07/31/2013  . Atypical squamous cell changes of undetermined significance (ASCUS) on cervical cytology with positive high risk human papilloma virus (HPV) 07/19/2013  . Vaginal discharge 03/01/2013  . Right ankle pain 12/13/2012  . Hypertension 04/21/2012  . Well woman exam 04/21/2012  . History of renal transplant 01/07/2012  . Cerumen impaction 12/29/2011  . Bacterial vaginosis 04/29/2011  . Dysuria 10/16/2010  . GERD 10/10/2009  . ANEMIA 08/11/2007  . GENITAL HERPES, HX OF 08/11/2007  . End-stage renal disease needing dialysis ([place]) 10/21/2006    Past Surgical History:  Procedure Laterality Date  . COLPOSCOPY  2016  . diaylsis shunt Right    arm  . KIDNEY TRANSPLANT  October 14, 2011   deceased donor kidney     OB History    Gravida  4   Para  3   Term  2   Preterm  1   AB  1   Living  3     SAB      TAB      Ectopic  1   Multiple      Live Births               Home Medications    Prior to Admission medications   Medication Sig Start Date End Date Taking? Authorizing Provider  aspirin EC 81 MG tablet 81  mg.  02/16/18 02/16/19  [provider]  carvedilol (COREG) 6.25 MG tablet Take by mouth as needed.  03/14/18 03/14/19  [provider]  mycophenolate (CELLCEPT) 250 MG capsule Take 250 mg by mouth 2 (two) times daily.  07/19/18   [provider]  omeprazole (PRILOSEC) 20 MG capsule Take 20 mg by mouth 2 (two) times daily as needed.     [provider]  predniSONE (DELTASONE) 5 MG tablet Take 5 mg by mouth daily.  07/19/18   [provider]  tacrolimus (PROGRAF) 1 MG capsule Take 2 mg by mouth 2 (two) times daily. 2 capsules in am and pm    [provider]  valGANciclovir (VALCYTE) 450 MG tablet Take 450 mg by mouth daily.  07/02/18   [provider]    Family History  Family History  Problem Relation Age of Onset  . Diabetes Father   . Diabetes Paternal Aunt   . Diabetes Paternal Grandmother   . Alcohol abuse Paternal Grandmother   . Alcohol abuse Paternal Uncle   . Cancer Maternal Grandmother        breast  . Breast cancer Maternal Grandmother   . Alcohol abuse Paternal Grandfather     Social History Social History   Tobacco Use  . Smoking status: Never Smoker  . Smokeless tobacco: Never Used  Substance Use Topics  . Alcohol use: No  . Drug use: No     Allergies   Lisinopril and Tape   Review of Systems Review of Systems  All other systems reviewed and are negative.    Physical Exam Updated Vital Signs BP 111/81   Pulse 86   Temp 98.3 F (36.8 C) (Oral)   Resp 17   SpO2 99%   Physical Exam Vitals signs and nursing note reviewed.  Constitutional:      General: She is not in acute distress.    Appearance: She is well-developed.  HENT:     Head: Normocephalic and atraumatic.  Eyes:     Conjunctiva/sclera: Conjunctivae normal.  Neck:     Musculoskeletal: Neck supple.  Cardiovascular:     Rate and Rhythm: Normal rate and regular rhythm.     Heart sounds: No murmur.  Pulmonary:     Effort: Pulmonary effort is normal. No respiratory distress.     Breath sounds: Normal breath sounds.  Abdominal:     Palpations: Abdomen is soft.     Tenderness: There is no abdominal tenderness.  Musculoskeletal: Normal range of motion.     Comments: Low back tender to palpation bilaterally, no bony step-off or deformity of the spine Moves all extremities Ambulatory Strength of lower extremities 5/5, normal strength with great toe extension, dorsiflexion, plantarflexion  Skin:    General: Skin is warm and dry.     Comments: No rash or erythema  Neurological:     Mental Status: She is alert and oriented to person, place, and time.  Psychiatric:        Mood and Affect: Mood normal.        Behavior: Behavior normal.        Thought  Content: Thought content normal.        Judgment: Judgment normal.      ED Treatments / Results  Labs (all labs ordered are listed, but only abnormal results are displayed) Labs Reviewed  URINALYSIS, ROUTINE W REFLEX MICROSCOPIC - Abnormal; Notable for the following components:      Result Value   Color,  Urine STRAW (*)    All other components within normal limits  URINE CULTURE  PREGNANCY, URINE  CBC  BASIC METABOLIC PANEL    EKG None  Radiology No results found.  Procedures Procedures (including critical care time)  Medications Ordered in ED Medications - No data to display   Initial Impression / Assessment and Plan / ED Course  I have reviewed the triage vital signs and the nursing notes.  Pertinent labs & imaging results that were available during my care of the patient were reviewed by me and considered in my medical decision making (see chart for details).        Patient with low back pain.  Onset the morning after she had lifted her young family member.  She thinks that she may have strained her back.  She does not have any radiating pain or sciatica.  She does have tenderness to palpation in her low back musculature.  Her symptoms are worsened with movement as well.  Sent to the emergency department by her transplant coordinator to ensure no renal etiology.  Creatinine is baseline for the patient (1.3-1.6), laboratory work-up is otherwise reassuring.  Urinalysis negative.  Will treat for musculoskeletal back pain with a Lidoderm patch and muscle relaxer.  Recommended stretching and walking.  Patient also states that she has had some vaginal discharge.  She states that she was treated for yeast infection by her doctor, but believes that she has bacterial vaginosis.  She has a history of this, and gets it frequently.  The symptoms were ongoing prior to the development of her back pain.  She is not sexually active.  Doubt PID, or other pelvic infection.  Will give  Flagyl and recommend PCP follow-up if not improving.  Final Clinical Impressions(s) / ED Diagnoses   Final diagnoses:  Acute bilateral low back pain without sciatica  Vaginal discharge    ED Discharge Orders         Ordered    metroNIDAZOLE (FLAGYL) 500 MG tablet  2 times daily     01/14/19 2321    fluconazole (DIFLUCAN) 150 MG tablet   Once     01/14/19 2321    lidocaine (LIDODERM) 5 %  Every 24 hours     01/14/19 2321    cyclobenzaprine (FLEXERIL) 10 MG tablet  2 times daily PRN     01/14/19 2321           Montine Circle, PA-C 01/14/19 2325    Malvin Johns, MD 01/15/19 1227

## 2019-01-14 NOTE — ED Notes (Signed)
Asked pt to provide urine sample for pregnancy test, pt stated "theres no point because it has been 4 years since I have had sexual intercourse". Did not obtain urine sample.

## 2019-01-17 LAB — URINE CULTURE: Culture: NO GROWTH

## 2019-02-28 ENCOUNTER — Other Ambulatory Visit: Payer: Self-pay | Admitting: Student in an Organized Health Care Education/Training Program

## 2019-02-28 DIAGNOSIS — Z1231 Encounter for screening mammogram for malignant neoplasm of breast: Secondary | ICD-10-CM

## 2019-03-21 ENCOUNTER — Encounter: Payer: Medicare Other | Admitting: Student in an Organized Health Care Education/Training Program

## 2019-03-31 ENCOUNTER — Telehealth: Payer: Self-pay | Admitting: *Deleted

## 2019-03-31 ENCOUNTER — Encounter: Payer: Self-pay | Admitting: Student in an Organized Health Care Education/Training Program

## 2019-03-31 ENCOUNTER — Other Ambulatory Visit: Payer: Self-pay

## 2019-03-31 ENCOUNTER — Ambulatory Visit (INDEPENDENT_AMBULATORY_CARE_PROVIDER_SITE_OTHER): Payer: Medicare Other | Admitting: Student in an Organized Health Care Education/Training Program

## 2019-03-31 VITALS — BP 110/68 | HR 94 | Wt 173.6 lb

## 2019-03-31 DIAGNOSIS — N920 Excessive and frequent menstruation with regular cycle: Secondary | ICD-10-CM

## 2019-03-31 DIAGNOSIS — R5383 Other fatigue: Secondary | ICD-10-CM

## 2019-03-31 DIAGNOSIS — Z940 Kidney transplant status: Secondary | ICD-10-CM | POA: Diagnosis not present

## 2019-03-31 DIAGNOSIS — Z23 Encounter for immunization: Secondary | ICD-10-CM

## 2019-03-31 NOTE — Progress Notes (Signed)
   Subjective:    Patient ID: Candace Griffin, female    DOB: September 15, 1975, 43 y.o.   MRN: [id number]   CC: physical  HPI:  Patient presents today for a Pap smear, however, she is not due until July of next year.  Her last Pap smear was negative.  I would favor closer interval forb her as she is relatively immunocompromised and has had abnormal tests in the past. She has been having heavier menstrual periods than normal.  They continue to be at regular intervals every month and are lasting for approximately a week.  She states that she has very heavy bleeding including a large blood clot the size of a closed fist.  Her last menstrual period was July 4-11, and then again July 31 through August 6.  She has been experiencing increased fatigue with the increased bleeding.  She had a CBC about 2 months ago which did not show anemia.  Denies any chest pain or shortness of breath.  She had some night sweats and hot flashes for about a year but those symptoms discontinued about a year ago shortly after her kidney transplant. her mother had a hysterectomy at around age 47 for "cysts".  She also believes that her mother started menopause at about this age. She is concerned that she has a musty smell to her vagina for a few days but not today. She believes that she has an increased clear discharge after her period which is typical. She denies any sexual activity for >4 years. She denies dysuria, hematuria, or vaginal pain. She is interested in possibly getting a pelvic exam and testing her pH in next visit.  Smoking status reviewed   ROS: pertinent noted in the HPI   Past medical history, surgical, family, and social history reviewed and updated in the EMR as appropriate.  Objective:  BP 110/68   Pulse 94   Wt 173 lb 9.6 oz (78.7 kg)   SpO2 98%   BMI 27.19 kg/m   Vitals and nursing note reviewed  General: NAD, pleasant, able to participate in exam Pelvic: external exam revealed no obvious mass in  pelvic externa area. Uterus not palpated.  Extremities: no edema or cyanosis. Skin: warm and dry, no rashes noted Neuro: alert, no obvious focal deficits Psych: Normal affect and mood   Assessment & Plan:   Status post kidney transplant Kidney transplant about 15 months ago.  Menorrhagia With moderately regular cycle intervals. Checking for anemia today.  Physical exam negative for obvious signs of anemia. Checking menopausal hormones today.  LMP 7/31 through 8/6. DDx includes perimenopausal symptoms, uterine fibroids, endometrial malignancy Strong family history for early menopause onset. External physical exam negative for uterine masses   Doristine Mango, [place] PGY-2

## 2019-03-31 NOTE — Addendum Note (Signed)
Addended by: Richarda Osmond on: 03/31/2019 01:38 PM   Modules accepted: Orders

## 2019-03-31 NOTE — Telephone Encounter (Signed)
LVM to call office back.  Tried to upload immunizations in [facility] and it shows an address on [address].  Want to verify if she has ever lived on that [address] and if not I will create a new profile for her.  Candace Griffin, CMA

## 2019-03-31 NOTE — Assessment & Plan Note (Addendum)
With moderately regular cycle intervals. Checking for anemia today.  Physical exam negative for obvious signs of anemia. Checking menopausal hormones today.  LMP 7/31 through 8/6. DDx includes perimenopausal symptoms, uterine fibroids, endometrial malignancy Strong family history for early menopause onset. External physical exam negative for uterine masses

## 2019-03-31 NOTE — Assessment & Plan Note (Signed)
Kidney transplant about 15 months ago.

## 2019-03-31 NOTE — Addendum Note (Signed)
Addended by: Katharina Caper, Lenis Nettleton D on: 03/31/2019 04:57 PM   Modules accepted: Orders, SmartSet

## 2019-04-01 LAB — PROLACTIN: Prolactin: 19.2 ng/mL (ref 4.8–23.3)

## 2019-04-01 LAB — FSH/LH
FSH: 9.5 m[IU]/mL
LH: 5.4 m[IU]/mL

## 2019-04-01 LAB — TSH: TSH: 1.19 u[IU]/mL (ref 0.450–4.500)

## 2019-04-03 NOTE — Telephone Encounter (Signed)
Patient verifies that she did used to live on [address].  The address in her chart is the correct one now. Christen Bame, CMA

## 2019-04-14 ENCOUNTER — Other Ambulatory Visit: Payer: Self-pay

## 2019-04-14 ENCOUNTER — Ambulatory Visit
Admission: RE | Admit: 2019-04-14 | Discharge: 2019-04-14 | Disposition: A | Discharge status: Home / Self Care | Payer: Medicare Other | Source: Ambulatory Visit | Attending: Student | Admitting: Student

## 2019-04-14 DIAGNOSIS — Z1231 Encounter for screening mammogram for malignant neoplasm of breast: Secondary | ICD-10-CM

## 2019-04-14 IMAGING — MG DIGITAL SCREENING BILATERAL MAMMOGRAM WITH TOMO AND CAD
8 series · 8 of 24 positions shown · non-contrast
Comparison: Previous exam(s).

CLINICAL DATA: Screening.

EXAM:
DIGITAL SCREENING BILATERAL MAMMOGRAM WITH TOMO AND CAD

[L CC synth-2D]
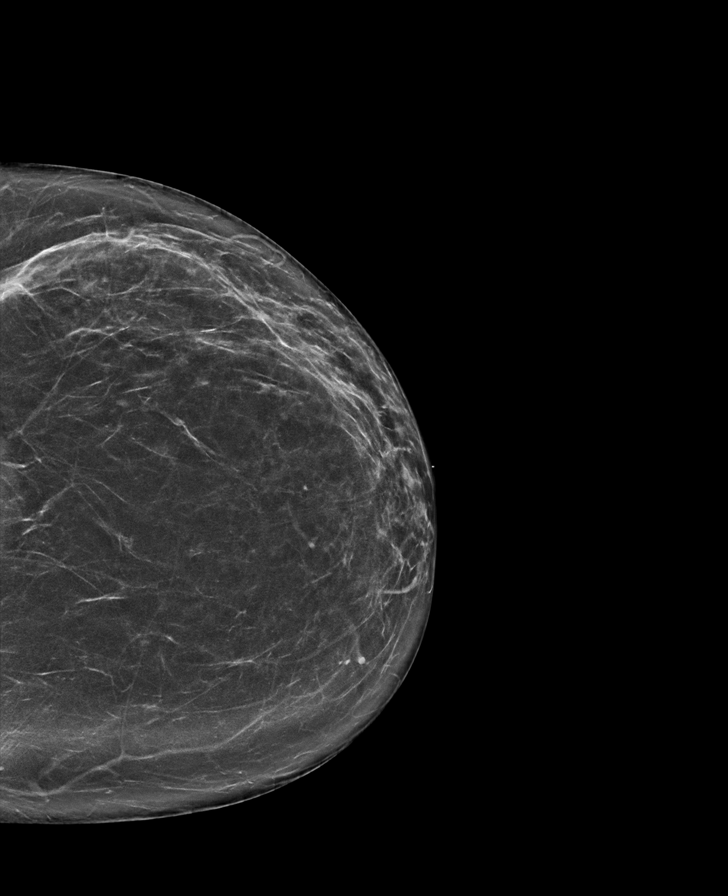

[R MLO synth-2D]
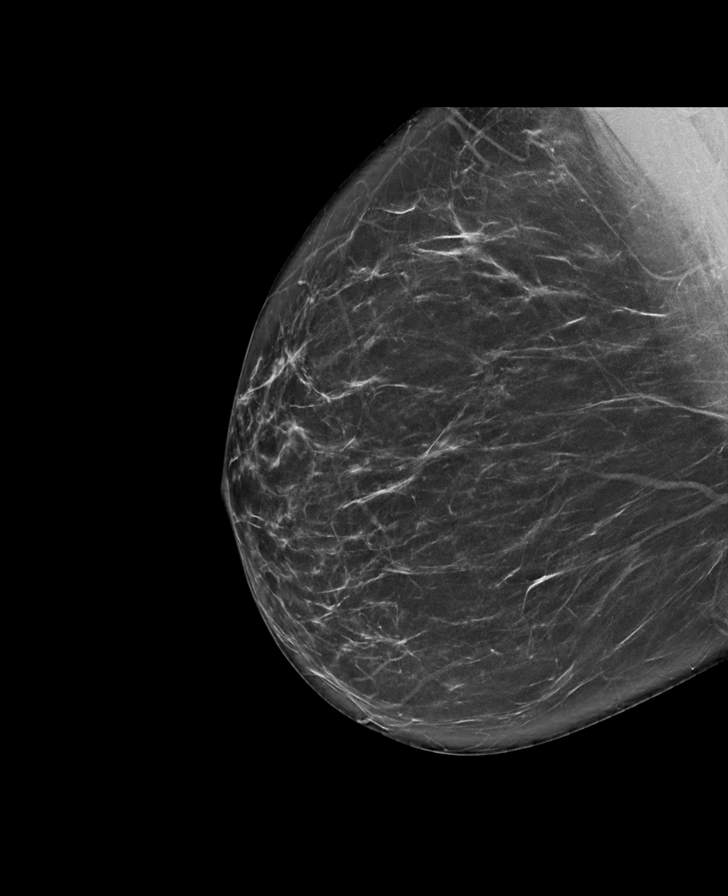

[L MLO synth-2D]
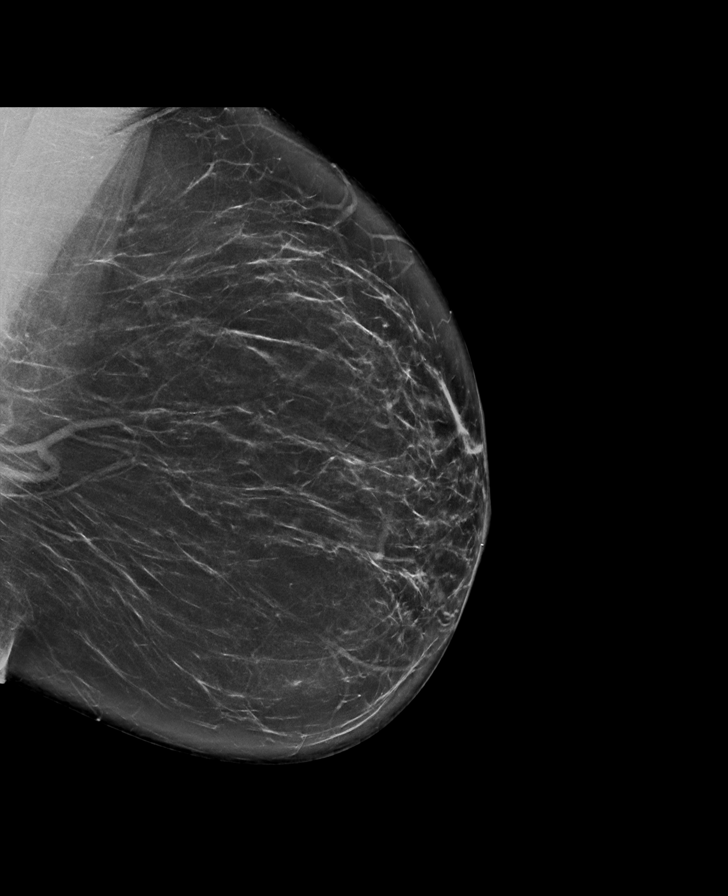

[R CC synth-2D]
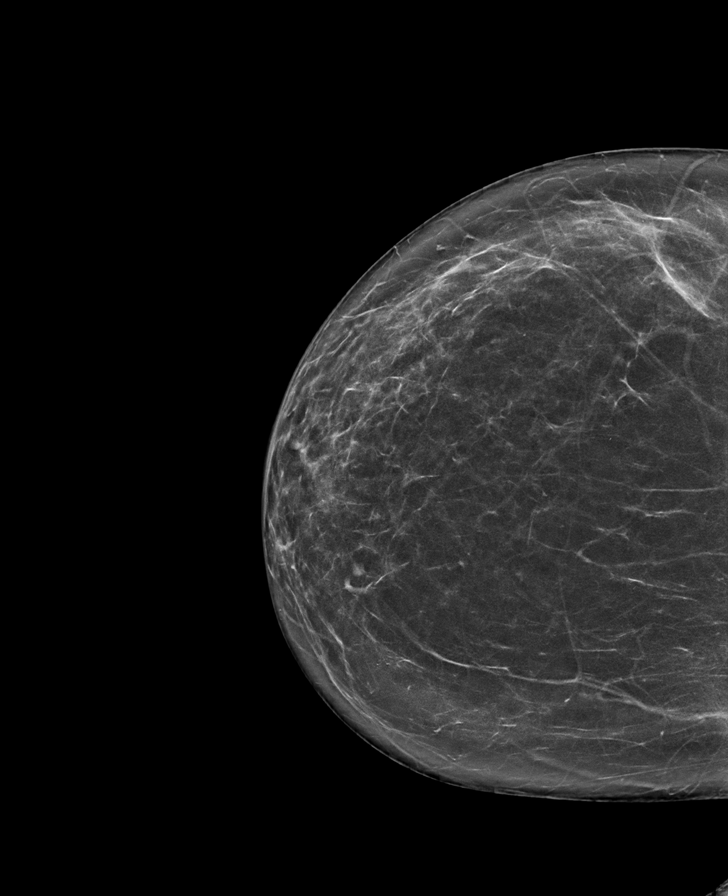

[R MLO tomo · tomo slice 40/79.0]
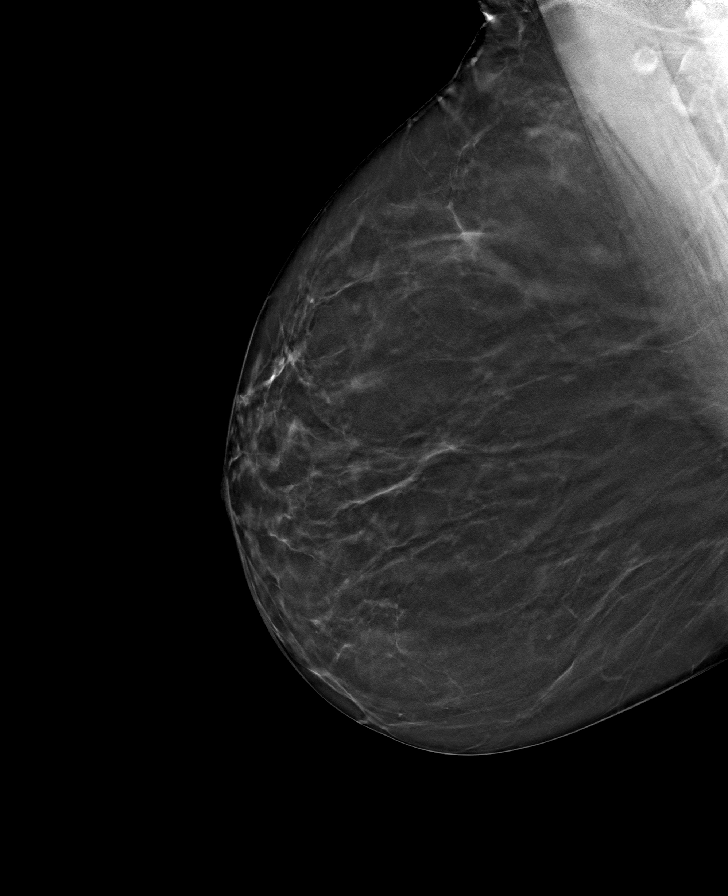

[R CC tomo · tomo slice 38/75.0]
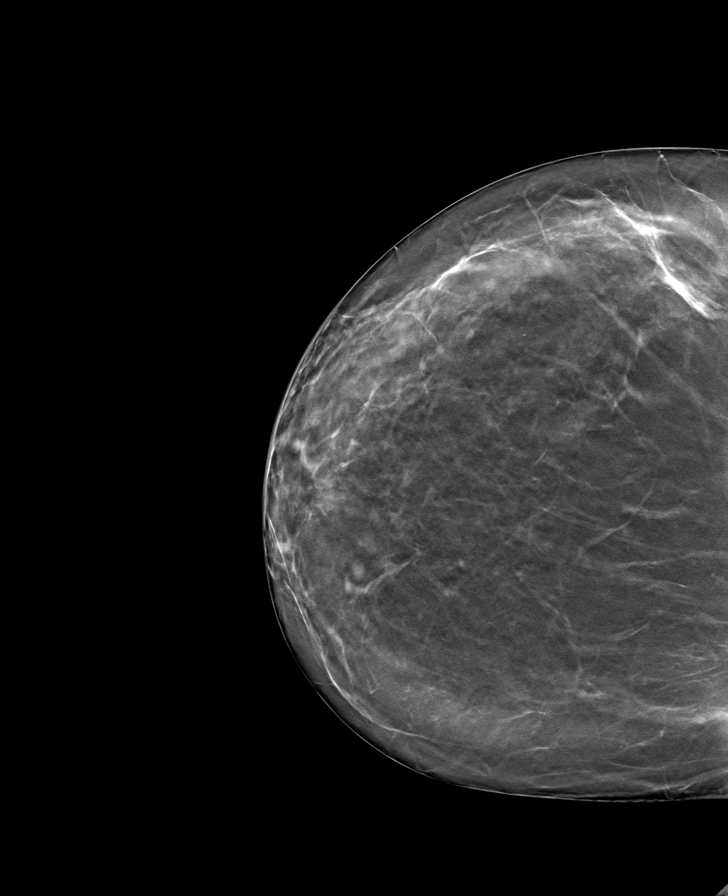

[L CC tomo · tomo slice 38/75.0]
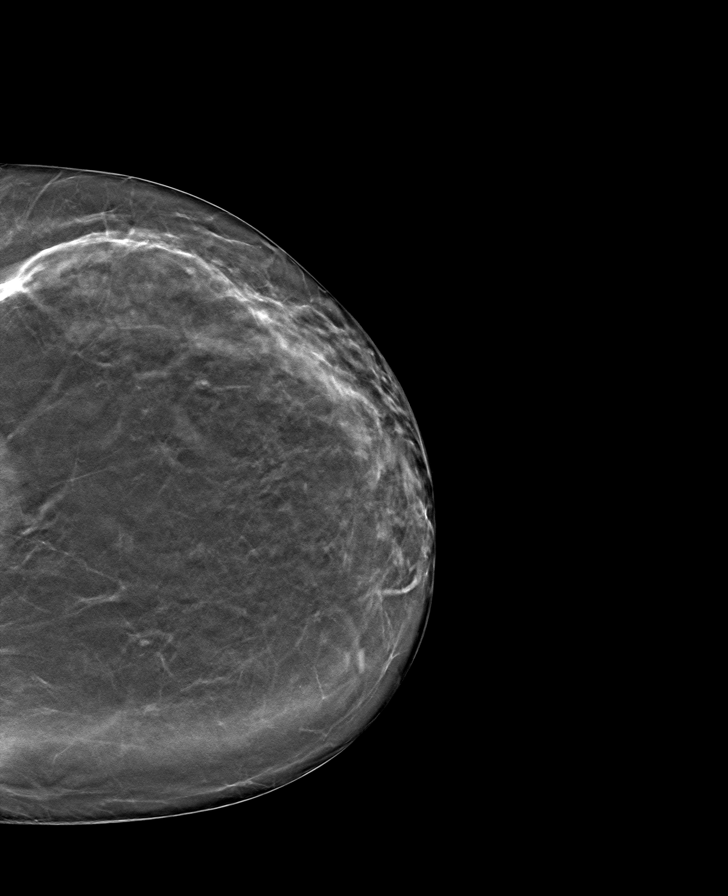

[L MLO tomo · tomo slice 42/83.0]
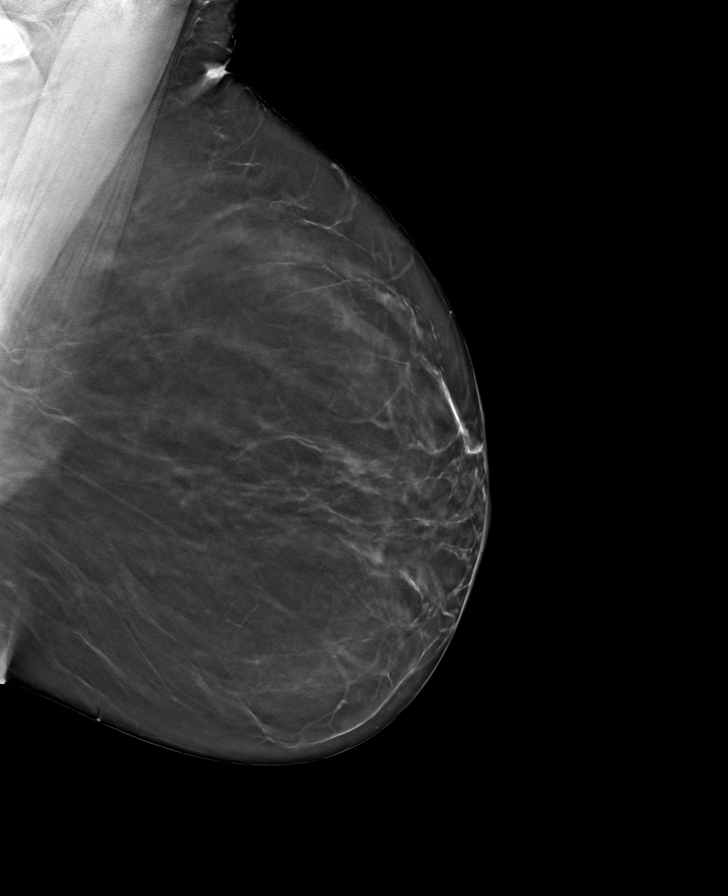

[8 of 24 positions shown; findings below may reference images not displayed]

ACR Breast Density Category b: There are scattered areas of
fibroglandular density.
FINDINGS: There are no findings suspicious for malignancy. Images were
processed with CAD.
IMPRESSION: No mammographic evidence of malignancy. A result letter of this
screening mammogram will be mailed directly to the patient.

RECOMMENDATION:
Screening mammogram in one year. (Code:[TQ])

BI-RADS CATEGORY  1: Negative.

## 2019-04-18 LAB — CBC WITH DIFFERENTIAL/PLATELET
Basophils Absolute: 0 10*3/uL (ref 0.0–0.2)
Basos: 0 %
EOS (ABSOLUTE): 0.1 10*3/uL (ref 0.0–0.4)
Eos: 1 %
Hematocrit: 28.9 % — ABNORMAL LOW (ref 34.0–46.6)
Hemoglobin: 9.5 g/dL — ABNORMAL LOW (ref 11.1–15.9)
Immature Grans (Abs): 0 10*3/uL (ref 0.0–0.1)
Immature Granulocytes: 0 %
Lymphocytes Absolute: 1.4 10*3/uL (ref 0.7–3.1)
Lymphs: 24 %
MCH: 30.6 pg (ref 26.6–33.0)
MCHC: 32.9 g/dL (ref 31.5–35.7)
MCV: 93 fL (ref 79–97)
Monocytes Absolute: 0.6 10*3/uL (ref 0.1–0.9)
Monocytes: 11 %
Neutrophils Absolute: 3.7 10*3/uL (ref 1.4–7.0)
Neutrophils: 64 %
Platelets: 259 10*3/uL (ref 150–450)
RBC: 3.1 x10E6/uL — ABNORMAL LOW (ref 3.77–5.28)
RDW: 13.3 % (ref 11.7–15.4)
WBC: 5.9 10*3/uL (ref 3.4–10.8)

## 2019-04-18 LAB — TSH+PRL+FSH+TESTT+LH+DHEA S...
17-Hydroxyprogesterone: 16 ng/dL
Androstenedione: 38 ng/dL — ABNORMAL LOW (ref 41–262)
DHEA-SO4: 2.4 ug/dL — ABNORMAL LOW (ref 57.3–279.2)
FSH: 9.5 m[IU]/mL
LH: 5 m[IU]/mL
Prolactin: 19.6 ng/mL (ref 4.8–23.3)
TSH: 1.25 u[IU]/mL (ref 0.450–4.500)
Testosterone, Free: <0.2 pg/mL (ref 0.0–4.2)
Testosterone: <3 ng/dL — ABNORMAL LOW (ref 8–48)

## 2019-05-02 ENCOUNTER — Other Ambulatory Visit: Payer: Self-pay

## 2019-05-02 ENCOUNTER — Encounter: Payer: Self-pay | Admitting: Family Medicine

## 2019-05-02 ENCOUNTER — Ambulatory Visit (INDEPENDENT_AMBULATORY_CARE_PROVIDER_SITE_OTHER): Payer: Medicare Other | Admitting: Family Medicine

## 2019-05-02 VITALS — BP 106/74

## 2019-05-02 DIAGNOSIS — Z23 Encounter for immunization: Secondary | ICD-10-CM | POA: Diagnosis not present

## 2019-05-02 DIAGNOSIS — N898 Other specified noninflammatory disorders of vagina: Secondary | ICD-10-CM | POA: Insufficient documentation

## 2019-05-02 LAB — POCT WET PREP (WET MOUNT)
Clue Cells Wet Prep Whiff POC: NEGATIVE
Trichomonas Wet Prep HPF POC: ABSENT

## 2019-05-02 NOTE — Assessment & Plan Note (Signed)
Wet prep negative. No abnormalities noted on exam. Advised patient odor and discomfort may be increased sensitivity to scented soaps and detergents and these would put her at risk of developing yeast and BV infections in the future, especially given her chronic prednisone use. Advised unscented soaps, detergents, lotions, creams, see AVS for details. Return precautions discussed.

## 2019-05-02 NOTE — Progress Notes (Signed)
  Subjective:   Patient ID: Candace Griffin    DOB: July 07, 1976, 43 y.o. female   MRN: [id number]  Candace Griffin is a 43 y.o. 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 Has had BV previously. Thinks she may have either BV or yeast infection.  Endorses abnormal vaginal odor for the past 4-6 months.  Denies abnormal vaginal discharge. Medications tried: metrogel, OTC yeast medication  Recent antibiotic use: no Sex in last month: no, not had sex in last 5 years Possible STD exposure:no Has used various soaps - caress, Oil of Olay, Dove. Switched to scented gain detergent recently. Has also tried putting vinegar in bath water to help with smell.  Symptoms Fever: no Dysuria: no Vaginal bleeding: no Abdomen or Pelvic pain: no Back pain: no Genital sores or ulcers:no Rash: no Pain during sex: n/a Missed menstrual period: no, LMP 04/25/19.  Review of Systems:  Per HPI.  [place], medications and smoking status reviewed.  Objective:   BP 106/74   LMP 04/25/2019 (Approximate)  Vitals and nursing note reviewed.  General: well nourished, well developed, in no acute distress with non-toxic appearance GYN:  External genitalia within normal limits.  Vaginal mucosa pink, moist, normal rugae.  Nonfriable cervix without lesions, no discharge or bleeding noted on speculum exam.  No cervical motion tenderness.   Skin: warm, dry, no rashes or lesions Extremities: warm and well perfused, normal tone MSK: ROM grossly intact, strength intact, gait normal Neuro: Alert and oriented, speech normal  Assessment & Plan:   Vaginal odor Wet prep negative. No abnormalities noted on exam. Advised patient odor and discomfort may be increased sensitivity to scented soaps and detergents and these would put her at risk of developing yeast and BV infections in the future, especially given  her chronic prednisone use. Advised unscented soaps, detergents, lotions, creams, see AVS for details. Return precautions discussed.   Orders Placed This Encounter  Procedures  . Flu Vaccine QUAD 36+ mos IM  . POCT Wet Prep [facility])   No orders of the defined types were placed in this encounter.   Rory Percy, DO PGY-3, [facility] 05/02/2019 6:18 PM

## 2019-05-02 NOTE — Patient Instructions (Signed)
It was great to see you!  Our plans for today:  - Your wet prep was negative for yeast or BV infection.  - See below for tips on preventing vaginal discomfort.  GO WHITE: Soap: UNSCENTED Dove (white box light green writing) Laundry detergent (underwear)- Dreft or [employer] unscented WHITE 100% cotton panties (NOT just cotton crouch) Sanitary napkin/panty liners: UNSCENTED.  If it doesn't SAY unscented it can have a scent/perfume    NO PERFUMES OR LOTIONS OR POTIONS in the vulvar area (may use regular [place]) Condoms: hypoallergenic only. Non dyed (no color) Toilet papers: white only Wash cloths: use a separate wash cloth. WHITE.  Washed in unscented detergent.   Take care and seek immediate care sooner if you develop any concerns.   Dr. [facility]

## 2019-10-03 ENCOUNTER — Ambulatory Visit (INDEPENDENT_AMBULATORY_CARE_PROVIDER_SITE_OTHER): Payer: Medicare Other | Admitting: Family Medicine

## 2019-10-03 ENCOUNTER — Other Ambulatory Visit: Payer: Self-pay

## 2019-10-03 VITALS — BP 120/62 | HR 95 | Ht 67.0 in | Wt 185.0 lb

## 2019-10-03 DIAGNOSIS — R300 Dysuria: Secondary | ICD-10-CM | POA: Diagnosis not present

## 2019-10-03 DIAGNOSIS — R399 Unspecified symptoms and signs involving the genitourinary system: Secondary | ICD-10-CM

## 2019-10-03 LAB — POCT URINALYSIS DIP (MANUAL ENTRY)
Bilirubin, UA: NEGATIVE
Blood, UA: NEGATIVE
Glucose, UA: NEGATIVE mg/dL
Ketones, POC UA: NEGATIVE mg/dL
Leukocytes, UA: NEGATIVE
Nitrite, UA: NEGATIVE
Protein Ur, POC: NEGATIVE mg/dL
Spec Grav, UA: <=1.005 — AB (ref 1.010–1.025)
Urobilinogen, UA: 0.2 E.U./dL
pH, UA: 6.5 (ref 5.0–8.0)

## 2019-10-03 LAB — POCT WET PREP (WET MOUNT)
Clue Cells Wet Prep Whiff POC: NEGATIVE
Trichomonas Wet Prep HPF POC: ABSENT

## 2019-10-03 MED ORDER — CEPHALEXIN 500 MG PO CAPS: 500.0000 mg | ORAL_CAPSULE | Freq: Two times a day (BID) | ORAL | 0 refills | Status: AC

## 2019-10-03 NOTE — Progress Notes (Deleted)
   CHIEF COMPLAINT / HPI: Urinary frequency, pain, pressure. Has not been sexually active in over four years. Drinks a lot of water. Has had 3-12 oz waters today. Has been drinking   PERTINENT  PMH / PSH: ESRD s/p kidney trx June 2019 on Cellcept and Inversis (previously Prograf) prednisone    OBJECTIVE: BP 120/62   Pulse 95   Ht 5\' 7"  (1.702 m)   Wt 185 lb (83.9 kg)   SpO2 99%   BMI 28.98 kg/m   ***  ASSESSMENT / PLAN:  No problem-specific Assessment & Plan notes found for this encounter.     Wilber Oliphant, MD [place]

## 2019-10-03 NOTE — Assessment & Plan Note (Signed)
Patient's presentation differential was UTI versus vaginitis.  Wet prep was negative.  UTI was also negative but was dilute.  Given that patient has had symptoms and discomfort, will treat empirically.  For local antibiogram, choosing Keflex 500 mg twice daily x7 days.  Urine culture also sent.  Called patient to update her on results and she is agreeable to taking medication.  She is to call if she does not have any improvement in 3 to 4 days.  Will confirm that culture shows sensitivities to antibiotic.

## 2019-10-03 NOTE — Progress Notes (Signed)
   CHIEF COMPLAINT / HPI: Patient reports urinary frequency, pressure and dysuria since last Thursday.  She reports that she used to get UTIs a lot prior to transplant but has not had 1 recently.  She reports that she has a pressure in her lower pelvis that kept her in bed yesterday.  She denies any fevers or chills.  She does report some vaginal itching. Also reports some urinary hesitancy when using the bathroom that has never occurred before.  She reports that when she gets the sensation to urinate, she will go to the bathroom and sometimes has to sit there for about 30 seconds before any urine evacuates..  She reports normal volumes of urine with urination.  She denies any recent sexual activity and reports she has not been sexually active in 4 years.  She denies any cottage cheese-like discharge.  She denies any back pain at this time.  Urinary frequency, pain, pressure. Has not been sexually active in over four years. Drinks a lot of water. Has had 3-12 oz waters today. Has been drinking   PERTINENT  PMH / PSH: Status post kidney transplant in June 2019.  OBJECTIVE: BP 120/62   Pulse 95   Ht 5\' 7"  (1.702 m)   Wt 185 lb (83.9 kg)   SpO2 99%   BMI 28.98 kg/m   Well appearing female. NAD.   ASSESSMENT / PLAN:  Dysuria Patient's presentation differential was UTI versus vaginitis.  Wet prep was negative.  UTI was also negative but was dilute.  Given that patient has had symptoms and discomfort, will treat empirically.  For local antibiogram, choosing Keflex 500 mg twice daily x7 days.  Urine culture also sent.  Called patient to update her on results and she is agreeable to taking medication.  She is to call if she does not have any improvement in 3 to 4 days.  Will confirm that culture shows sensitivities to antibiotic.    Wilber Oliphant, MD [place]

## 2019-10-05 LAB — URINE CULTURE: Organism ID, Bacteria: NO GROWTH

## 2019-11-14 ENCOUNTER — Ambulatory Visit (INDEPENDENT_AMBULATORY_CARE_PROVIDER_SITE_OTHER): Payer: Medicare Other | Admitting: Student in an Organized Health Care Education/Training Program

## 2019-11-14 ENCOUNTER — Encounter: Payer: Self-pay | Admitting: Student in an Organized Health Care Education/Training Program

## 2019-11-14 ENCOUNTER — Other Ambulatory Visit: Payer: Self-pay

## 2019-11-14 ENCOUNTER — Encounter: Payer: Self-pay | Admitting: *Deleted

## 2019-11-14 ENCOUNTER — Other Ambulatory Visit (HOSPITAL_COMMUNITY)
Admission: RE | Admit: 2019-11-14 | Discharge: 2019-11-14 | Disposition: A | Discharge status: Home / Self Care | Payer: Medicare Other | Source: Ambulatory Visit | Attending: Family Medicine | Admitting: Family Medicine

## 2019-11-14 VITALS — BP 118/68 | HR 96 | Ht 67.0 in | Wt 178.0 lb

## 2019-11-14 DIAGNOSIS — Z124 Encounter for screening for malignant neoplasm of cervix: Secondary | ICD-10-CM

## 2019-11-14 DIAGNOSIS — N898 Other specified noninflammatory disorders of vagina: Secondary | ICD-10-CM

## 2019-11-14 DIAGNOSIS — M79602 Pain in left arm: Secondary | ICD-10-CM | POA: Diagnosis not present

## 2019-11-14 DIAGNOSIS — Z940 Kidney transplant status: Secondary | ICD-10-CM | POA: Diagnosis not present

## 2019-11-14 DIAGNOSIS — Z01419 Encounter for gynecological examination (general) (routine) without abnormal findings: Secondary | ICD-10-CM | POA: Diagnosis not present

## 2019-11-14 DIAGNOSIS — Z1151 Encounter for screening for human papillomavirus (HPV): Secondary | ICD-10-CM | POA: Diagnosis not present

## 2019-11-14 LAB — POCT WET PREP (WET MOUNT)
Clue Cells Wet Prep Whiff POC: NEGATIVE
Trichomonas Wet Prep HPF POC: ABSENT

## 2019-11-14 LAB — POCT URINALYSIS DIP (MANUAL ENTRY)
Bilirubin, UA: NEGATIVE
Glucose, UA: NEGATIVE mg/dL
Ketones, POC UA: NEGATIVE mg/dL
Leukocytes, UA: NEGATIVE
Nitrite, UA: NEGATIVE
Protein Ur, POC: NEGATIVE mg/dL
Spec Grav, UA: 1.02 (ref 1.010–1.025)
Urobilinogen, UA: 0.2 E.U./dL
pH, UA: 6 (ref 5.0–8.0)

## 2019-11-14 LAB — POCT UA - MICROSCOPIC ONLY

## 2019-11-14 MED ORDER — DICLOFENAC SODIUM 1 % EX GEL: 2.0000 g | Freq: Four times a day (QID) | CUTANEOUS | 0 refills | Status: DC

## 2019-11-14 NOTE — Progress Notes (Signed)
    SUBJECTIVE:   CHIEF COMPLAINT / HPI: physical/pap  Patient has no complaints today  Well Adult Physical: Patient here for a comprehensive physical exam.The patient reports problems - Worried about breast abnormalities, urinary symptoms, left arm pain   -Patient performs breast exams daily and has not noticed any skin changes, nipple changes or discharge, pain, or masses.  She does notice some tenderness with her breasts which is associated with her menstrual cycle.  Last mammogram less than a year ago was normal. -Endorses occasional transient painful urination a couple of times over the past month but is not currently experiencing any symptoms.  Does express some urgency and frequency.  Denies blood in urine.  Has not had sexual intercourse for over 5 years. -Has had left arm pain from lateral shoulder to her elbow for the last 2 months.  Denies radiation or association with exertion or diaphoresis.  Patient is left-handed and sleeps on that side more frequently over the past 2 months.  Denies neck pain.  Does not notice any certain movements or activities provoke the pain.  Does not limit her daily activities but notices it 1-2 times on most days.  Has not noticed any skin changes.  Has tried ice and heat with no help. Do you take any herbs or supplements that were not prescribed by a doctor? no Are you taking calcium supplements? no Are you taking aspirin daily? no GU History: LMP: Patient's last menstrual period was 10/17/2019. Last pap date: 02/25/2017 and was normal.  However, previous was abnormal and patient is requesting to get her Pap smear repeated today even though it is not due Abnormal pap? no today Gravida: 4 Para: 2113   PERTINENT  PMH / PSH: LSIL in 2016 with repeat normal in 2018.  OBJECTIVE:   BP 118/68   Pulse 96   Ht 5\' 7"  (1.702 m)   Wt 178 lb (80.7 kg)   LMP 10/17/2019   SpO2 99%   BMI 27.88 kg/m   General: NAD, pleasant, able to participate in  exam Cardiac: RRR, normal heart sounds, no murmurs. 2+ radial and PT pulses bilaterally Respiratory: CTAB, normal effort, No wheezes, rales or rhonchi Abdomen: soft, nontender, nondistended, no hepatic or splenomegaly, +BS Pelvic: Negative for external lesions, excessive discharge or cervical abnormalities.  Bimanual exam did not reveal tenderness or adnexal masses. Extremities: no edema or cyanosis. WWP. Skin: warm and dry, no rashes noted Neuro: alert and oriented x4, no focal deficits Psych: Normal affect and mood  ASSESSMENT/PLAN:   Well woman exam Pelvic exam performed today with normal Pap smear results. Urinalysis negative for signs of infection. Called to discuss results with patient  Status post kidney transplant Urinalysis negative for infection but did have trace 0-2 blood cells. Patient was not on menstrual cycle at the time of test. With patient's history of renal transplant recommended she follow-up with her nephrologist. She has been adherent with her medications and is seeing her nephrologist next week.  Left arm pain Tenderness to palpation to anterior deltoid and not surrounding tissues.  Full painless range of motion and strength intact to bilateral upper extremities which decreases the likelihood of nerve root involvement.  Prescribed topical Voltaren. Checked in with patient 2 days after appointment and she states that the gel and recommended activity/sleeping position changes have improved the pain     [place]

## 2019-11-14 NOTE — Patient Instructions (Signed)
It was a pleasure to see you today!  To summarize our discussion for this visit:  I will let you know about the results of your tests today.  For your breasts, please continue to monitor for the symptoms we talked about and get regular mammograms  For your arm pain, we can try a short course of a topical muscle relaxant and trying to change sleeping positions. Follow up with me if you are not having improvement.    Please return to our clinic to see me as needed.  Call the clinic at [phone] if your symptoms worsen or you have any concerns.   Thank you for allowing me to take part in your care,  Dr. Doristine Mango   Thoracic Outlet Syndrome Rehab Ask your health care provider which exercises are safe for you. Do exercises exactly as told by your health care provider and adjust them as directed. It is normal to feel mild stretching, pulling, tightness, or discomfort as you do these exercises. Stop right away if you feel sudden pain or your pain gets worse. Do not begin these exercises until told by your health care provider. Stretching and range-of-motion exercises These exercises warm up your muscles and joints and improve the movement and flexibility of your shoulder. These exercises also help to relieve pain, numbness, and tingling. Gentle chest stretch with belly breathing 1. On the floor, place a half foam roller or a bath towel that is rolled up lengthwise. 2. Lie on your back so your spine--all the way from your head to your tailbone--is on top of the foam roller or towel. Relax. You should feel a gentle stretch across your upper chest. ? Keep both feet or heels firmly on the floor. ? You may bend your knees for comfort. ? Let your arms fall naturally to your sides. 3. Breathe deeply and slowly from your belly. You should feel your belly rise each time you breathe in (inhale). Breathe out (exhale) completely before you inhale again. 4. Stay in this position and continue to  inhale and exhale for __________ seconds. ? Over time, gradually increase the amount of time that you hold this stretch, as told by your health care provider. Repeat __________ times. Complete this exercise __________ times a day. Scalene stretches There are 3 types of scalene stretches. They vary depending on which direction you are looking while you do the exercise. Your health care provider will tell you which type of scalene stretch to do. 1. Lie on your back. Place the hand from your left / right side under the hip of your left / right side. For example, if your right shoulder is injured, place your right hand under your right hip. 2. Gently and slowly tilt your head toward your healthy shoulder. Follow instructions from your health care provider about which direction you should turn your face while you tilt your head: ? Posterior scalene stretch: Turn your face toward your healthy shoulder, in the same direction that you tilt your head. ? Middle scalene stretch: Turn your face toward the ceiling while you tilt your head toward your healthy shoulder. ? Anterior scalene stretch: Turn your face toward your injured shoulder. This means that you tilt your head one direction (toward your healthy shoulder), but you turn your face in the opposite direction. 3. Hold each stretch for __________ seconds. Repeat __________ times. Complete this exercise __________ times a day. Chin tuck This exercise is sometimes called axial extension. 1. Using good posture, sit on a  stable surface or stand up. If you have trouble keeping good posture, rest your back and head against a stable wall during this exercise. 2. Look straight ahead and slowly move your chin back, toward your neck, until you feel a stretch in the back of your head. ? Your head should slide back. ? Your chin should be slightly lowered. 3. Hold for __________ seconds. 4. Return to the starting position. Repeat __________ times. Complete this  exercise __________ times a day. Shoulder rolls  1. Sit in a sturdy chair or stand up. Keep good posture during the exercise. 2. Shrug your shoulders up toward your ears and gently move your shoulders around in circles going forward. 3. Reverse the direction of your shoulder rolls so you are moving your shoulders around in circles going backward. Repeat __________ times. Complete this exercise __________ times a day. Chest stretch This exercise is sometimes called external rotation and abduction. 1. Stand in a doorway with one of your feet slightly in front of the other. This is called a staggered stance. If you cannot reach your forearms to the door frame, do this exercise in a corner of a room. 2. Move your hands away from the center of your body (abduction) by choosing one of the following positions (external rotation): ? Place your hands and forearms on the door frame above your head. ? Place your hands and forearms on the door frame at the height of your head. ? Place your hands on the door frame at the height of your elbows. 3. Slowly move your weight onto your front foot until you feel a stretch across your chest and in the front of your shoulders. Keep your head and chest upright and keep your abdominal muscles tight. 4. Hold for __________ seconds. 5. To release the stretch, shift your weight to your back foot. Repeat __________ times. Complete this exercise __________ times a day. Strengthening exercise This exercise builds strength and endurance in your shoulder. Endurance is the ability to use your muscles for a long time, even after they get tired. Scapular retraction  1. Sit in a stable chair without armrests, or stand up. 2. Secure an exercise band to a stable object in front of you so the band is at shoulder height. 3. Hold one end of the band in each hand. Your palms should face down. 4. Straighten your elbows and lift your arms up to shoulder height. 5. Step back, away  from the secured end of the band, until the band has no slack. 6. Squeeze your shoulder blades (scapulae) together and pull your elbows back behind you (retraction). ? Your elbows should be at about chest or shoulder height. ? Keep your upper arms lifted, away from your sides. 7. Hold for __________ seconds. 8. Slowly return to the starting position. Repeat __________ times. Complete this exercise __________ times a day. Posture and body mechanics Good posture and healthy body mechanics can help to relieve stress in your body's tissues and joints. Body mechanics refers to the movements and positions of your body while you do your daily activities. Posture is part of body mechanics. Good posture means:  Your spine is in its natural S-curve position (neutral).  Your shoulders are pulled back slightly.  Your head is not tipped forward. Follow these guidelines to improve your posture and body mechanics in your everyday activities. Standing   When standing, keep your spine neutral and your feet about hip width apart. Keep a slight bend in your knees.  Your ears, shoulders, and hips should line up with each other.  When you do a task in which you stand in one place for a long time, place one foot up on a stable object that is 2-4 inches (5-10 cm) high, such as a footstool. This helps keep your spine neutral. Sitting   When sitting, keep your spine neutral and keep your feet flat on the floor. Use a footrest, if necessary, and keep your thighs parallel to the floor. Avoid rounding your shoulders, and avoid tilting your head forward.  When working at a desk or a computer, keep your desk at a height where your hands are slightly lower than your elbows. Slide your chair under your desk so you are close enough to maintain good posture.  When working at a computer, place your monitor at a height where you are looking straight ahead and you do not have to tilt your head forward or downward to look at  the screen. Resting   When lying down and resting, avoid positions that are most painful for you.  If you have pain with activities such as sitting, bending, stooping, or squatting (flexion-based activities), lie in a position in which your body does not bend very much. For example, avoid curling up on your side with your arms and knees near your chest (fetal position).  If you have pain with activities such as standing for a long time or reaching with your arms (extension-based activities), lie with your spine in a neutral position and bend your knees slightly. Try the following positions: ? Lying on your side with a pillow between your knees. ? Lying on your back with a pillow under your knees. This information is not intended to replace advice given to you by your health care provider. Make sure you discuss any questions you have with your health care provider. Document Revised: 05/15/2019 Document Reviewed: 09/20/2018 Elsevier Patient Education  [place].

## 2019-11-16 ENCOUNTER — Telehealth: Payer: Self-pay

## 2019-11-16 DIAGNOSIS — M79602 Pain in left arm: Secondary | ICD-10-CM | POA: Insufficient documentation

## 2019-11-16 LAB — CYTOLOGY - PAP
Comment: NEGATIVE
Diagnosis: NEGATIVE
High risk HPV: NEGATIVE

## 2019-11-16 NOTE — Assessment & Plan Note (Signed)
Urinalysis negative for infection but did have trace 0-2 blood cells. Patient was not on menstrual cycle at the time of test. With patient's history of renal transplant recommended she follow-up with her nephrologist. She has been adherent with her medications and is seeing her nephrologist next week.

## 2019-11-16 NOTE — Telephone Encounter (Addendum)
-----   Message from Richarda Osmond, DO sent at 11/16/2019  2:06 PM EDT ----- Regarding: Lab results Please contact patient to let her know that her lab results including her Pap smear were normal.   Informed patient of results.  Ozella Almond, [place]

## 2019-11-16 NOTE — Assessment & Plan Note (Signed)
Tenderness to palpation to anterior deltoid and not surrounding tissues.  Full painless range of motion and strength intact to bilateral upper extremities which decreases the likelihood of nerve root involvement.  Prescribed topical Voltaren. Checked in with patient 2 days after appointment and she states that the gel and recommended activity/sleeping position changes have improved the pain

## 2019-11-16 NOTE — Assessment & Plan Note (Addendum)
Pelvic exam performed today with normal Pap smear results. Urinalysis negative for signs of infection. Called to discuss results with patient

## 2020-01-09 ENCOUNTER — Encounter: Payer: Self-pay | Admitting: Family Medicine

## 2020-01-09 ENCOUNTER — Ambulatory Visit (INDEPENDENT_AMBULATORY_CARE_PROVIDER_SITE_OTHER): Payer: Medicare Other | Admitting: Family Medicine

## 2020-01-09 ENCOUNTER — Other Ambulatory Visit: Payer: Self-pay

## 2020-01-09 ENCOUNTER — Ambulatory Visit (HOSPITAL_COMMUNITY)
Admit: 2020-01-09 | Discharge: 2020-01-09 | Disposition: A | Discharge status: Home / Self Care | Payer: Medicare Other | Attending: Family Medicine | Admitting: Family Medicine

## 2020-01-09 ENCOUNTER — Encounter (HOSPITAL_COMMUNITY): Payer: Self-pay | Admitting: Emergency Medicine

## 2020-01-09 ENCOUNTER — Emergency Department (HOSPITAL_COMMUNITY)
Admission: EM | Admit: 2020-01-09 | Discharge: 2020-01-10 | Disposition: A | Discharge status: Home / Self Care | Payer: Medicare Other | Attending: Emergency Medicine | Admitting: Emergency Medicine

## 2020-01-09 VITALS — BP 102/64 | HR 110 | Ht 67.0 in | Wt 178.6 lb

## 2020-01-09 DIAGNOSIS — N939 Abnormal uterine and vaginal bleeding, unspecified: Secondary | ICD-10-CM | POA: Insufficient documentation

## 2020-01-09 DIAGNOSIS — D631 Anemia in chronic kidney disease: Secondary | ICD-10-CM | POA: Insufficient documentation

## 2020-01-09 DIAGNOSIS — R102 Pelvic and perineal pain: Secondary | ICD-10-CM | POA: Diagnosis not present

## 2020-01-09 DIAGNOSIS — D649 Anemia, unspecified: Secondary | ICD-10-CM | POA: Diagnosis present

## 2020-01-09 DIAGNOSIS — Z992 Dependence on renal dialysis: Secondary | ICD-10-CM | POA: Insufficient documentation

## 2020-01-09 DIAGNOSIS — N186 End stage renal disease: Secondary | ICD-10-CM | POA: Insufficient documentation

## 2020-01-09 DIAGNOSIS — I120 Hypertensive chronic kidney disease with stage 5 chronic kidney disease or end stage renal disease: Secondary | ICD-10-CM | POA: Insufficient documentation

## 2020-01-09 DIAGNOSIS — Z79899 Other long term (current) drug therapy: Secondary | ICD-10-CM | POA: Diagnosis not present

## 2020-01-09 LAB — CBC
HCT: 34.4 % — ABNORMAL LOW (ref 36.0–46.0)
Hemoglobin: 10.5 g/dL — ABNORMAL LOW (ref 12.0–15.0)
MCH: 28.6 pg (ref 26.0–34.0)
MCHC: 30.5 g/dL (ref 30.0–36.0)
MCV: 93.7 fL (ref 80.0–100.0)
Platelets: 297 10*3/uL (ref 150–400)
RBC: 3.67 MIL/uL — ABNORMAL LOW (ref 3.87–5.11)
RDW: 13.1 % (ref 11.5–15.5)
WBC: 6.5 10*3/uL (ref 4.0–10.5)
nRBC: 0 % (ref 0.0–0.2)

## 2020-01-09 LAB — BASIC METABOLIC PANEL
Anion gap: 9 (ref 5–15)
BUN: 11 mg/dL (ref 6–20)
CO2: 24 mmol/L (ref 22–32)
Calcium: 9.5 mg/dL (ref 8.9–10.3)
Chloride: 102 mmol/L (ref 98–111)
Creatinine, Ser: 1.36 mg/dL — ABNORMAL HIGH (ref 0.44–1.00)
GFR calc Af Amer: 55 mL/min — ABNORMAL LOW (ref >60)
GFR calc non Af Amer: 48 mL/min — ABNORMAL LOW (ref >60)
Glucose, Bld: 98 mg/dL (ref 70–99)
Potassium: 3.7 mmol/L (ref 3.5–5.1)
Sodium: 135 mmol/L (ref 135–145)

## 2020-01-09 LAB — POCT HEMOGLOBIN: Hemoglobin: 9.7 g/dL — AB (ref 11–14.6)

## 2020-01-09 NOTE — ED Provider Notes (Signed)
[facility] EMERGENCY DEPARTMENT Provider Note   CSN: [id number] Arrival date & time: 01/09/20  1456     History Chief Complaint  Patient presents with  . low hemglobin  . Vaginal Bleeding    Candace Griffin is a 44 y.o. female.  44 y.o female with a PMH of  ESRD, GERD, symptomatic anemia and kidney transplant presents to the ED sent in by PCP.  Patient was seen by her primary care physician today, had blood work drawn and was informed to be seen in the emergency department due to low hemoglobin.  According to patient her hemoglobin was 9.6 in office, she has had some vaginal bleeding for the past month.  Reports lab work that she had last month was within normal limits.  However, she has had too regular cycles this month that have lasted about 9 days each, reports seeing bright red clots and has been changing 1 pad every 2 hours.  Reports she was at [employer] store last week when she felt like she was "going to fall out ".  She is also noted that she is unable to walk up her stairs at home, stating that she gets very short of breath, has to take a break in between all the steps.  She also attributes some dizziness but has not had any syncopal episodes.  Patient is currently not sexually active and has not been in 5 years.      The history is provided by the patient and medical records.  Vaginal Bleeding Quality:  Bright red, clots and heavier than menses Severity:  Moderate Onset quality:  Gradual Duration:  18 days Timing:  Intermittent Progression:  Worsening Chronicity:  New Menstrual history:  Irregular Number of pads used:  1 every 2 hours  Possible pregnancy: no   Associated symptoms: abdominal pain and dizziness   Associated symptoms: no back pain, no fever and no nausea   Risk factors: no bleeding disorder, no hx of endometriosis, no new sexual partner and no ovarian cysts        Past Medical History:  Diagnosis Date  . Chronic  glomerulonephritis   . Dialysis patient ([place])   . End stage renal disease ([facility])   . GERD (gastroesophageal reflux disease)   . History of renal transplant 16-Oct-2011  . Hypertension     Patient Active Problem List   Diagnosis Date Noted  . Left arm pain 11/16/2019  . Symptomatic anemia 12/03/2017  . Low grade squamous intraepithelial lesion (LGSIL) on cervical Pap smear 05/03/2014  . Genital warts 07/31/2013  . Hypertension 04/21/2012  . Status post kidney transplant 01/07/2012  . Menorrhagia 10/16/11  . GERD 10/10/2009  . GENITAL HERPES, HX OF 08/11/2007    Past Surgical History:  Procedure Laterality Date  . COLPOSCOPY  2016  . diaylsis shunt Right    arm  . KIDNEY TRANSPLANT  Oct 16, 2011   deceased donor kidney     OB History    Gravida  4   Para  3   Term  2   Preterm  1   AB  1   Living  3     SAB      TAB      Ectopic  1   Multiple      Live Births              Family History  Problem Relation Age of Onset  . Diabetes Father   . Diabetes Paternal Aunt   .  Diabetes Paternal Grandmother   . Alcohol abuse Paternal Grandmother   . Alcohol abuse Paternal Uncle   . Cancer Maternal Grandmother        breast  . Breast cancer Maternal Grandmother   . Alcohol abuse Paternal Grandfather     Social History   Tobacco Use  . Smoking status: Never Smoker  . Smokeless tobacco: Never Used  Substance Use Topics  . Alcohol use: No  . Drug use: No    Home Medications Prior to Admission medications   Medication Sig Start Date End Date Taking? Authorizing Provider  mycophenolate (CELLCEPT) 250 MG capsule Take 250 mg by mouth 2 (two) times daily.  07/19/18  Yes [provider]  omeprazole (PRILOSEC) 20 MG capsule Take 20 mg by mouth 2 (two) times daily as needed (for heart burn).    Yes [provider]  pravastatin (PRAVACHOL) 20 MG tablet Take 20 mg by mouth at bedtime.  05/29/19  Yes [provider]  predniSONE (DELTASONE)  5 MG tablet Take 5 mg by mouth daily with breakfast.  07/25/19  Yes [provider]  Tacrolimus ER (ENVARSUS XR) 1 MG TB24 Take 3 mg by mouth daily. 12/01/19  Yes [provider]  diclofenac Sodium (VOLTAREN) 1 % GEL Apply 2 g topically 4 (four) times daily. Patient not taking: Reported on 01/10/2020 11/14/19   Doristine Mango L, DO    Allergies    Lisinopril, Other, and Tape  Review of Systems   Review of Systems  Constitutional: Negative for fever.  HENT: Negative for sore throat.   Respiratory: Positive for shortness of breath.   Cardiovascular: Negative for chest pain.  Gastrointestinal: Positive for abdominal pain. Negative for blood in stool, nausea and vomiting.  Genitourinary: Positive for menstrual problem and vaginal bleeding. Negative for decreased urine volume and flank pain.  Musculoskeletal: Negative for back pain.  Skin: Negative for pallor and wound.  Neurological: Positive for dizziness. Negative for light-headedness and headaches.  All other systems reviewed and are negative.   Physical Exam Updated Vital Signs BP 118/82 (BP Location: Left Arm)   Pulse 97   Temp 98.2 F (36.8 C) (Oral)   Resp 18   LMP 12/28/2019   SpO2 97%   Physical Exam Vitals and nursing note reviewed. Exam conducted with a chaperone present.  Constitutional:      Appearance: Normal appearance.  HENT:     Head: Normocephalic and atraumatic.     Nose: Nose normal.     Mouth/Throat:     Mouth: Mucous membranes are moist.  Eyes:     Pupils: Pupils are equal, round, and reactive to light.  Cardiovascular:     Rate and Rhythm: Normal rate.  Pulmonary:     Effort: Pulmonary effort is normal.     Breath sounds: No wheezing or rales.  Abdominal:     General: Abdomen is flat.     Palpations: Abdomen is soft.     Tenderness: There is abdominal tenderness in the right lower quadrant, suprapubic area and left lower quadrant. There is no guarding or rebound. Negative signs  include McBurney's sign.  Genitourinary:    Comments: RN chaperoned. No erythema, petechia, no vaginal bleeding noted. No CMT no adnexa tenderness.  Musculoskeletal:     Cervical back: Normal range of motion and neck supple.  Skin:    General: Skin is warm and dry.     Coloration: Skin is not pale.  Neurological:     Mental Status:  She is alert and oriented to person, place, and time.     ED Results / Procedures / Treatments   Labs (all labs ordered are listed, but only abnormal results are displayed) Labs Reviewed  CBC - Abnormal; Notable for the following components:      Result Value   RBC 3.67 (*)    Hemoglobin 10.5 (*)    HCT 34.4 (*)    All other components within normal limits  BASIC METABOLIC PANEL - Abnormal; Notable for the following components:   Creatinine, Ser 1.36 (*)    GFR calc non Af Amer 48 (*)    GFR calc Af Amer 55 (*)    All other components within normal limits  HCG, SERUM, QUALITATIVE  TYPE AND SCREEN    EKG None  Radiology US PELVIC COMPLETE WITH TRANSVAGINAL  Result Date: 01/10/2020 CLINICAL DATA:  Initial evaluation for acute vaginal bleeding. EXAM: TRANSABDOMINAL AND TRANSVAGINAL ULTRASOUND OF PELVIS TECHNIQUE: Both transabdominal and transvaginal ultrasound examinations of the pelvis were performed. Transabdominal technique was performed for global imaging of the pelvis including uterus, ovaries, adnexal regions, and pelvic cul-de-sac. It was necessary to proceed with endovaginal exam following the transabdominal exam to visualize the pelvic structures. COMPARISON:  Prior CT from 02/03/2015 FINDINGS: Uterus Measurements: 9.1 x 6.2 x 6.0 cm = volume: 175 mL. Uterus is anteverted. 2.0 x 1.7 x 1.5 cm intramural fibroid present at the left uterine fundus. Small nabothian cyst noted at the cervix. Endometrium Thickness: 7 mm.  No focal abnormality visualized. Right ovary Measurements: 1.6 x 1.2 x 1.5 cm = volume: 1.5 mL. Normal appearance/no adnexal mass.  Left ovary Measurements: 2.6 x 1.7 x 2.3 cm = volume: 5.2 mL. Normal appearance/no adnexal mass. Other findings No abnormal free fluid. IMPRESSION: 1. Endometrial stripe measures 7 mm in thickness. If bleeding remains unresponsive to hormonal or medical therapy, sonohysterogram should be considered for focal lesion work-up. (Ref: Radiological Reasoning: Algorithmic Workup of Abnormal Vaginal Bleeding with Endovaginal Sonography and Sonohysterography. AJR 2008; 761:P50-93). 2. 2 cm intramural fibroid at the left uterine fundus. 3. Normal sonographic appearance of the ovaries. No other acute abnormality. Electronically Signed   By: Jeannine Boga M.D.   On: 01/10/2020 02:23    Procedures Procedures (including critical care time)  Medications Ordered in ED Medications - No data to display  ED Course  I have reviewed the triage vital signs and the nursing notes.  Pertinent labs & imaging results that were available during my care of the patient were reviewed by me and considered in my medical decision making (see chart for details).    MDM Rules/Calculators/A&P   Patient with a past medical history of renal transplant presents to the ED with complaints symptomatic anemia.  Had a blood work drawn by her PCP yesterday, was sent to the ED for further evaluation along with pelvic imaging.  She reports has been having irregular periods, which have lasted about 9 days with heavy bleeding, changing 1 pad every 2 hours and multiple clots.  She is currently not sexually active, no vaginal discharge.  Endorsing dizziness along with shortness of breath.  During evaluation patient is overall nontoxic, non-ill-appearing.  Abdomen is soft, mild tender to palpation along the lower abdomen, we discussed pelvic examination for further evaluation of her bleeding.  Does have a prior history of symptomatic anemia.Patient reports her hemoglobin was 9.6 yesterday, on today's blood work hemoglobin was 10.5 and  improving.  She does not have any leukocytosis on her  blood work. CBC without any leukocytosis, BMP without any electro normality, creatinine slightly elevated within her baseline, she has a previous history of renal transplant.Prengnacy   Pelvic exam did not reveal any foreign body, no strawberry cervix, no bleeding noted.  No CMT or adnexal tenderness.  Pelvic ultrasound was ordered.  Pelvic ultrasound showed: 1. Endometrial stripe measures 7 mm in thickness. If bleeding  remains unresponsive to hormonal or medical therapy, sonohysterogram  should be considered for focal lesion work-up. (Ref: Radiological  Reasoning: Algorithmic Workup of Abnormal Vaginal Bleeding with  Endovaginal Sonography and Sonohysterography. AJR 2008; 013:H43-88).  2. 2 cm intramural fibroid at the left uterine fundus.  3. Normal sonographic appearance of the ovaries. No other acute  abnormality.     I have discussed results of ultrasound with patient, she was provided with a copy of these at this time.  She will need to follow-up with her PCP.  Her hemoglobin remained stable at 10.9, she is agreeable of plan and management.    Portions of this note were generated with [employer]. Dictation errors may occur despite best attempts at proofreading.  Final Clinical Impression(s) / ED Diagnoses Final diagnoses:  Vaginal bleeding  Symptomatic anemia    Rx / DC Orders ED Discharge Orders    None       Janeece Fitting, PA-C 01/10/20 0232    Ripley Fraise, MD 01/10/20 [phone]

## 2020-01-09 NOTE — Assessment & Plan Note (Addendum)
Patient with symptomatic anemia.  Point-of-care hemoglobin 9.7 here in office.  CBC reviewed from 5/13.  At that time hemoglobin was 9.5 and MCV was within normal limits.  Can consider anemia of chronic disease especially given her renal history and requiring transplant.  Patient also with menorrhagia so can consider anemia secondary to blood loss.  Patient is acutely tachycardic and hypotensive here in the office.  Is also symptomatic with chest pain shortness of breath.  EKG showing sinus tachycardia, reviewed by myself and Dr. Gwendlyn Deutscher.  Given that patient is having symptomatic anemia we will plan to send patient to emergency department at where she can get further work-up including CBC and consider pelvic ultrasound.  I discussed with charge RN at emergency department as well as Dr. Shan Levans from the inpatient service.  Return precautions discussed.  Patient is decided to use her own vehicle to go to the emergency department.  I offered her assistance and that we would wheeled her over but she refused.  Follow-up in 1 week after discharge from the emergency department.

## 2020-01-09 NOTE — Progress Notes (Signed)
    SUBJECTIVE:   CHIEF COMPLAINT / HPI:   Anemia Patient presenting after being told by her hemoglobin was low.  Had recent blood work on 5/13 and then RN called her yesterday to 11 her hemoglobin was low.  Her baseline is around 11 and it dropped to 9.  States she feels very symptomatic as well.  States she feels very tired, short of breath.  Had chest pain last week.  States that the chest pain resolved with rest.  Patient states that she is just sleeping now.  Dates that she has stairs in her house but can no longer go up the stairs secondary to shortness of breath.  She states this feels similar to when she was on dialysis.  Does report pelvic pain and worsening.  Reports that since January her periods have lasted longer and have been heavier.  Periods are as follows: Menses in January started on 1/21 and lasted 5 days.  Menses in February started on 2/22 and lasted 5-6 days.  States that March started on 3/31 lasted 6 days.  Menses in April started on 4/18 lasted 10 days.  She continued to spot in the beginning of May.  Menses in May was on 5/6/90s.  She reports that usually her periods were only 2 to 3 days.  Now are very heavy as well, using overnight pads every 2 hours.  Is also having large clots.  Denies any blood in urine or stool.  Denies any history of colon cancer.  Denies any tobacco, alcohol, drug use.  Has not had intercourse in 5 years but does have pelvic pain.  PERTINENT  PMH / PSH: HTN, GERD, s/p kidney transplant, menorrhagia   OBJECTIVE:   BP 102/64   Pulse (!) 110   Ht 5\' 7"  (1.702 m)   Wt 178 lb 9.6 oz (81 kg)   LMP 12/28/2019   SpO2 98%   BMI 27.97 kg/m   Gen: awake and alert, NAD HEENT: moist mucous membranes, conjunctival normal Cardio: tachycardia, regular rhythm, 2+ pulses, manual pulse at 110.  Resp: CTAB, no wheezes, rales, or rhonchi GI: soft, non tender,non distended, bowel sounds present Ext: no edema  ASSESSMENT/PLAN:   Symptomatic anemia Patient  with symptomatic anemia.  Point-of-care hemoglobin 9.7 here in office.  CBC reviewed from 5/13.  At that time hemoglobin was 9.5 and MCV was within normal limits.  Can consider anemia of chronic disease especially given her renal history and requiring transplant.  Patient also with menorrhagia so can consider anemia secondary to blood loss.  Patient is acutely tachycardic and hypotensive here in the office.  Is also symptomatic with chest pain shortness of breath.  EKG showing sinus tachycardia, reviewed by myself and Dr. Gwendlyn Deutscher.  Given that patient is having symptomatic anemia we will plan to send patient to emergency department at where she can get further work-up including CBC and consider pelvic ultrasound.  I discussed with charge RN at emergency department as well as Dr. Shan Levans from the inpatient service.  Return precautions discussed.  Patient is decided to use her own vehicle to go to the emergency department.  I offered her assistance and that we would wheeled her over but she refused.  Follow-up in 1 week after discharge from the emergency department.    Patient discussed with Dr. Gwendlyn Deutscher prior to leaving clinic  Caroline More, [place]

## 2020-01-09 NOTE — ED Triage Notes (Signed)
Pt reports being sent by her PCP due to low hgb from her vaginal bleeding. Hgb at 1400 was 9.7. Hx of renal transplant.

## 2020-01-09 NOTE — Patient Instructions (Signed)
1.  These go directly to the emergency department as we discussed 2.  Follow-up in 1 week after discharge so we can make sure you are doing well 3.  If you have any issues after hours please use her after-hours emergency line

## 2020-01-10 ENCOUNTER — Emergency Department (HOSPITAL_COMMUNITY): Payer: Medicare Other

## 2020-01-10 DIAGNOSIS — N939 Abnormal uterine and vaginal bleeding, unspecified: Secondary | ICD-10-CM | POA: Diagnosis not present

## 2020-01-10 LAB — BPAM RBC
Blood Product Expiration Date: 202105282359
Blood Product Expiration Date: 202105292359
Unit Type and Rh: 5100
Unit Type and Rh: 5100

## 2020-01-10 LAB — TYPE AND SCREEN
ABO/RH(D): O POS
Antibody Screen: POSITIVE
Donor AG Type: NEGATIVE
Donor AG Type: NEGATIVE
PT AG Type: NEGATIVE
Unit division: 0
Unit division: 0

## 2020-01-10 LAB — HCG, SERUM, QUALITATIVE: Preg, Serum: NEGATIVE

## 2020-01-10 IMAGING — US US PELVIS COMPLETE WITH TRANSVAGINAL
1 series · 13 of 25 positions shown · non-contrast
Comparison: Prior CT from [DATE]

CLINICAL DATA: Initial evaluation for acute vaginal bleeding.

EXAM:
TRANSABDOMINAL AND TRANSVAGINAL ULTRASOUND OF PELVIS
TECHNIQUE: Both transabdominal and transvaginal ultrasound examinations of the
pelvis were performed. Transabdominal technique was performed for
global imaging of the pelvis including uterus, ovaries, adnexal
regions, and pelvic cul-de-sac. It was necessary to proceed with
endovaginal exam following the transabdominal exam to visualize the
pelvic structures.

[Series 1: us pelvis (transabdominal only) · 13 of 70 slices shown]
[im 1/70]
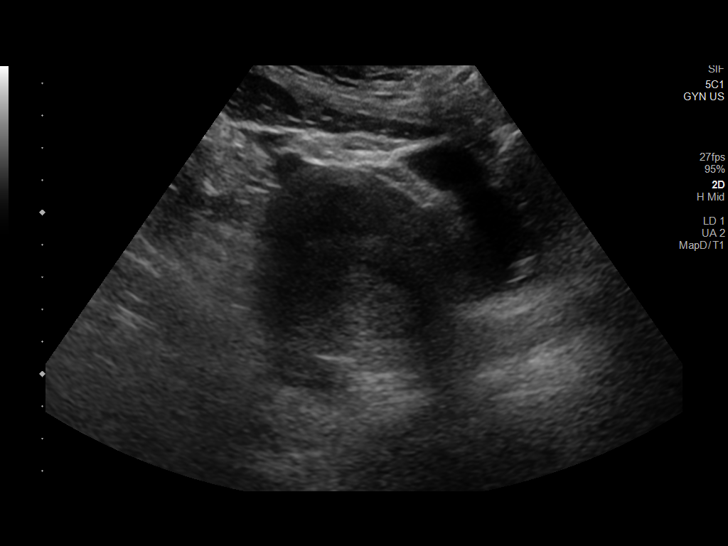
[im 6/70]
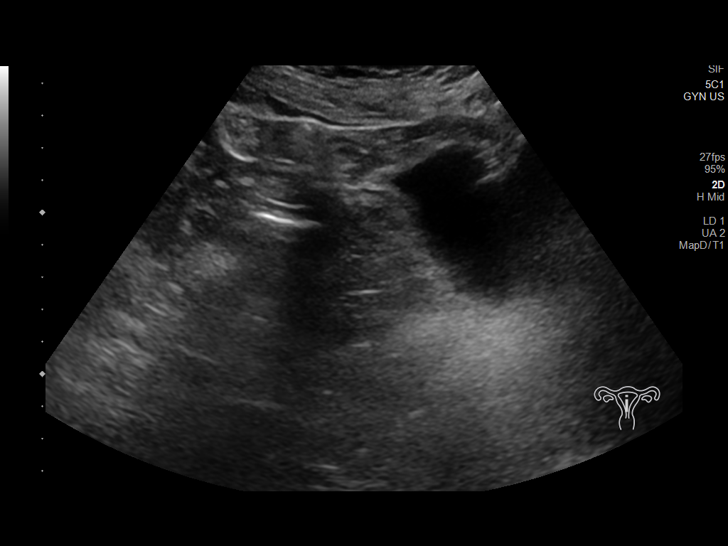
[im 12/70]
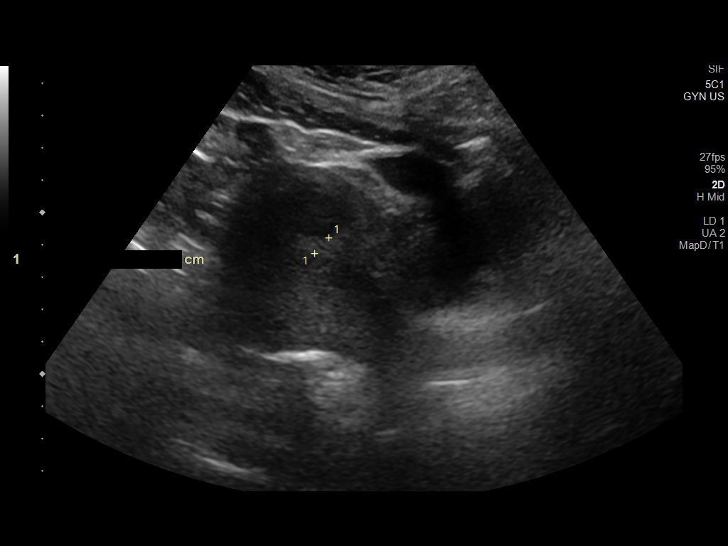
[im 18/70]
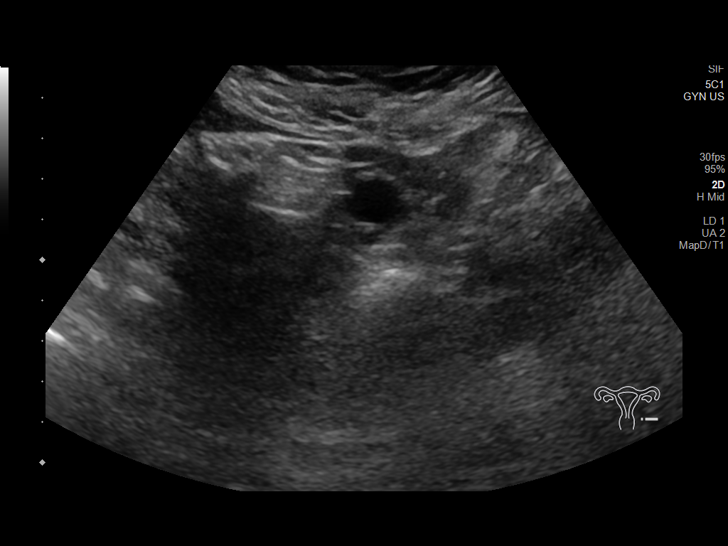
[im 24/70]
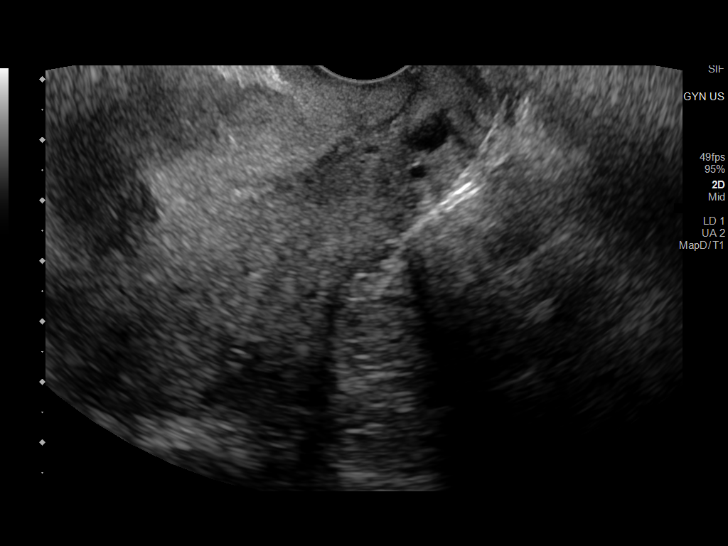
[im 29/70]
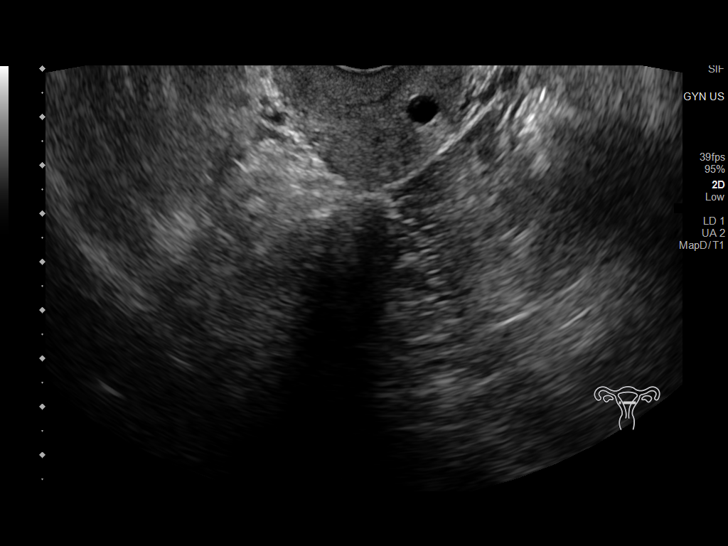
[im 35/70]
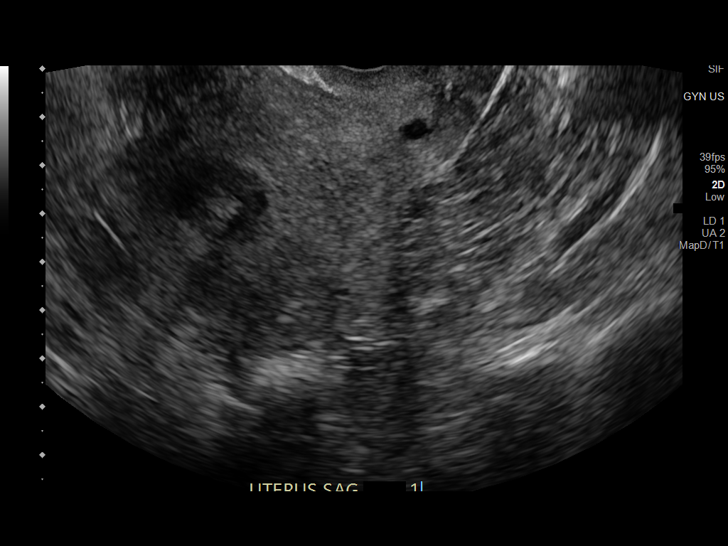
[im 41/70]
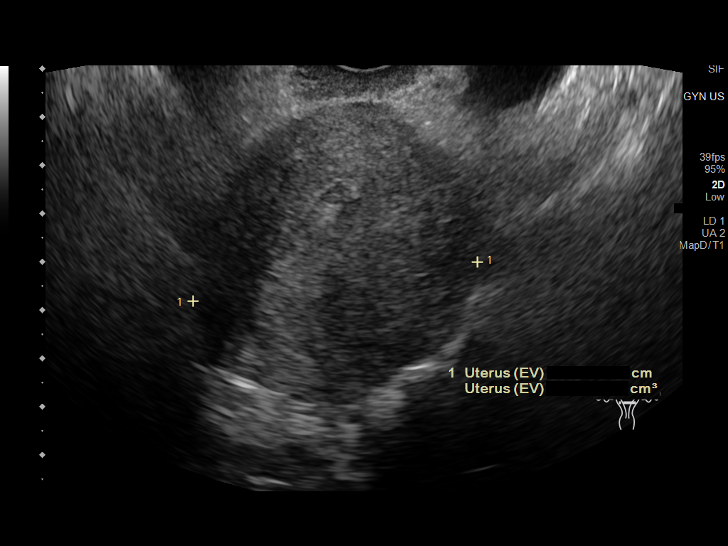
[im 47/70]
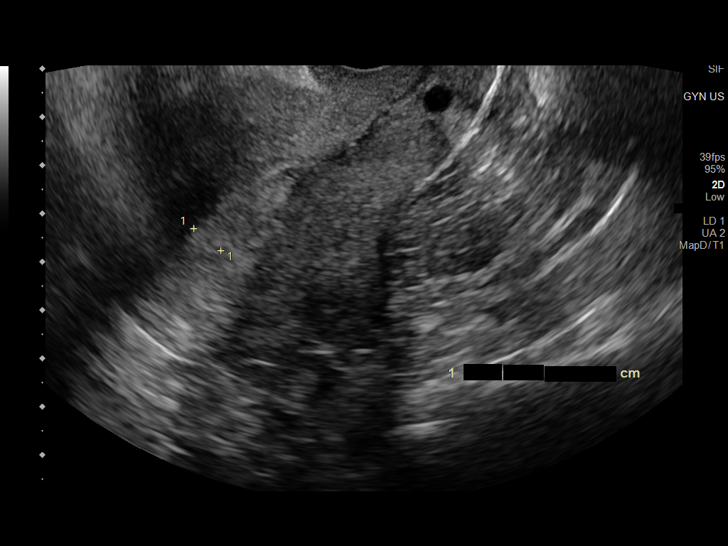
[im 52/70]
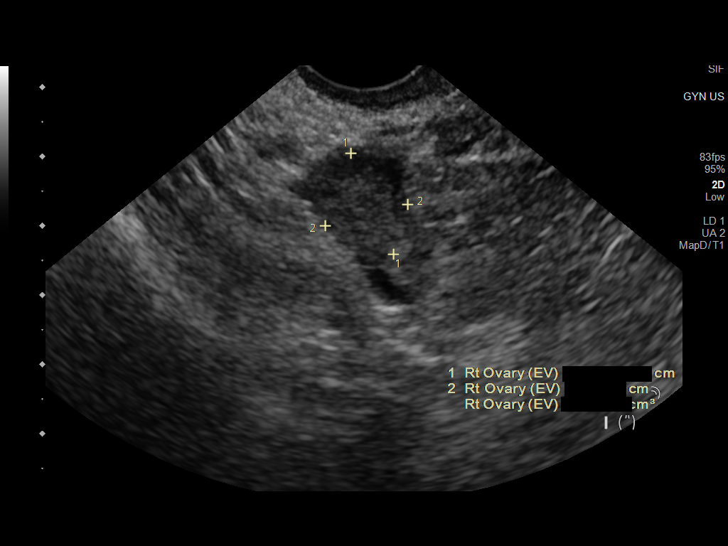
[im 58/70]
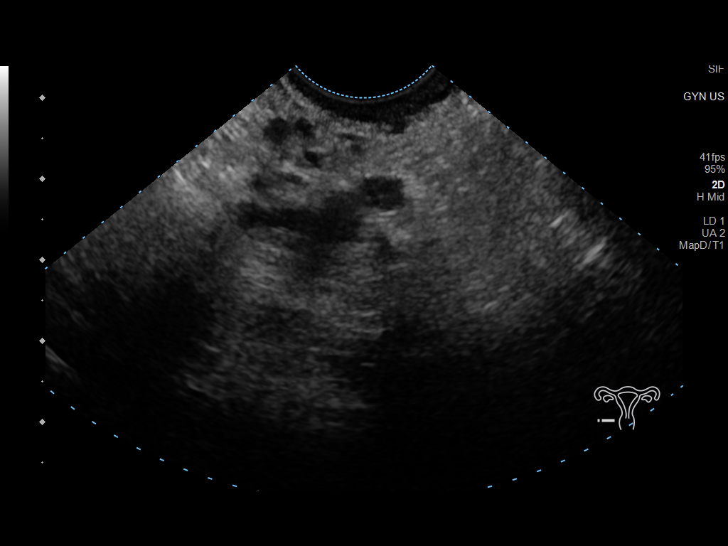
[im 64/70]
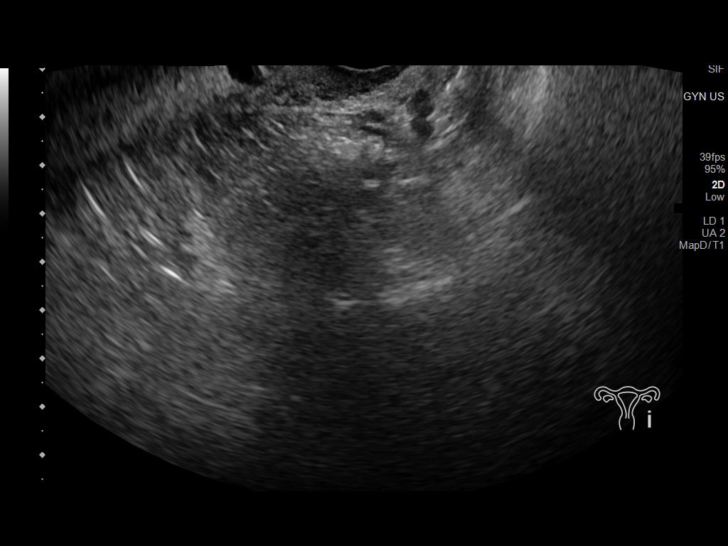
[im 70/70]
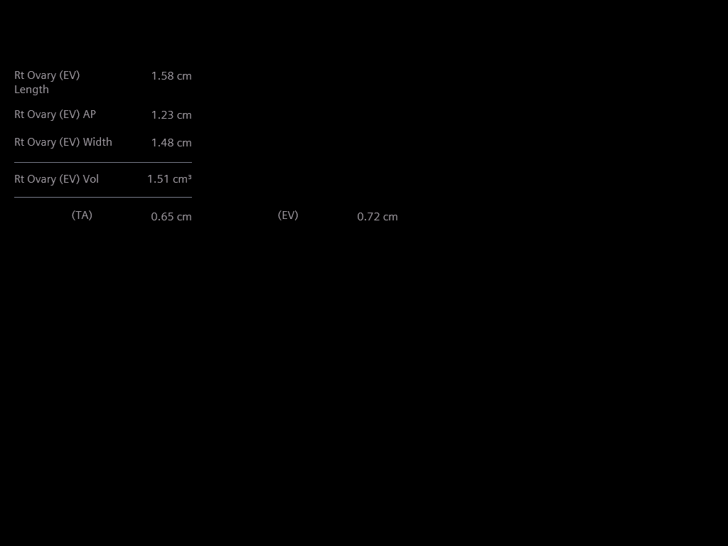

[13 of 25 positions shown; findings below may reference images not displayed]

FINDINGS: Uterus

Measurements: 9.1 x 6.2 x 6.0 cm = volume: 175 mL. Uterus is
anteverted. 2.0 x 1.7 x 1.5 cm intramural fibroid present at the
left uterine fundus. Small nabothian cyst noted at the cervix.

Endometrium

Thickness: 7 mm.  No focal abnormality visualized.

Right ovary

Measurements: 1.6 x 1.2 x 1.5 cm = volume: 1.5 mL. Normal
appearance/no adnexal mass.

Left ovary

Measurements: 2.6 x 1.7 x 2.3 cm = volume: 5.2 mL. Normal
appearance/no adnexal mass.

Other findings

No abnormal free fluid.
IMPRESSION: 1. Endometrial stripe measures 7 mm in thickness. If bleeding
remains unresponsive to hormonal or medical therapy, sonohysterogram
should be considered for focal lesion work-up. (Ref: Radiological
Reasoning: Algorithmic Workup of Abnormal Vaginal Bleeding with
Endovaginal Sonography and Sonohysterography. AJR [II]; 191:S68-73).
2. 2 cm intramural fibroid at the left uterine fundus.
3. Normal sonographic appearance of the ovaries. No other acute
abnormality.

## 2020-01-10 NOTE — Discharge Instructions (Addendum)
Your laboratory results showed a hemoglobin of 10.5 trending upwards from your previous visit.  I have provided a copy of the ultrasound of your pelvis, please follow up with your primary care physician if bleeding continues.

## 2020-01-10 NOTE — ED Notes (Signed)
Patient verbalizes understanding of discharge instructions. Opportunity for questioning and answers were provided. Armband removed by staff, pt discharged from ED ambulatory.   

## 2020-01-10 NOTE — ED Notes (Signed)
Pt taken to [place] via stretcher in stable condition with face mask in place.

## 2020-01-24 ENCOUNTER — Ambulatory Visit: Payer: Medicare Other | Admitting: Family Medicine

## 2020-02-06 ENCOUNTER — Ambulatory Visit: Payer: Medicare Other | Admitting: Student in an Organized Health Care Education/Training Program

## 2020-02-19 ENCOUNTER — Encounter: Payer: Self-pay | Admitting: Student in an Organized Health Care Education/Training Program

## 2020-02-19 ENCOUNTER — Ambulatory Visit (INDEPENDENT_AMBULATORY_CARE_PROVIDER_SITE_OTHER): Payer: Medicare Other | Admitting: Student in an Organized Health Care Education/Training Program

## 2020-02-19 ENCOUNTER — Other Ambulatory Visit: Payer: Self-pay

## 2020-02-19 VITALS — BP 110/70 | HR 85 | Ht 67.0 in | Wt 173.8 lb

## 2020-02-19 DIAGNOSIS — D649 Anemia, unspecified: Secondary | ICD-10-CM

## 2020-02-19 DIAGNOSIS — K5902 Outlet dysfunction constipation: Secondary | ICD-10-CM

## 2020-02-19 DIAGNOSIS — K5901 Slow transit constipation: Secondary | ICD-10-CM

## 2020-02-19 LAB — POCT HEMOGLOBIN: Hemoglobin: 10 g/dL — AB (ref 11–14.6)

## 2020-02-19 MED ORDER — SENNA 8.6 MG PO TABS: 1.0000 | ORAL_TABLET | Freq: Every day | ORAL | 0 refills | Status: DC | PRN

## 2020-02-19 MED ORDER — GLYCERIN (ADULT) 2 G RE SUPP: 1.0000 | RECTAL | 0 refills | Status: DC | PRN

## 2020-02-19 MED ORDER — PSYLLIUM 30.9 % PO POWD: 1.0000 [oz_av] | Freq: Every day | ORAL | 1 refills | Status: DC

## 2020-02-19 NOTE — Progress Notes (Signed)
    SUBJECTIVE:   CHIEF COMPLAINT / HPI: anemia f/u  Back pain- 2.5-3 weeks of lower back pain. Relaxing when first felt it in bilateral lower back but sometimes one side or the other. Sharp or dull at times. Lasts a few seconds to up to 5 hours. Just goes to sleep to make it go away. Positioning doesn't help. Almost every day. No certain time of day. Hasn't noticed any exacerbating triggers.  Last BM this morning and it was hard, strained and small amount. Sometimes noticed stool colored liquid when she wipes without a BM. No blood.  History of constipation. Usually every three days has BM but has been less these past few weeks. Denies narcotic use. Does not take any regulators.   Anemia- following with gyn for vaginal bleeding leading to symptomatic anemia. Not currently bleeding. Wants to review US findings today.  PERTINENT  PMH / PSH: uterine fibroid  OBJECTIVE:   BP 110/70   Pulse 85   Ht 5\' 7"  (1.702 m)   Wt 173 lb 12.8 oz (78.8 kg)   LMP 01/29/2020 (Approximate)   SpO2 96%   BMI 27.22 kg/m   General: NAD, pleasant, able to participate in exam Abdomen: soft, nontender UQs, nondistended, no hepatic or splenomegaly, decreased BM. Positive for tenderness in pelvis Extremities: no edema or cyanosis. WWP. Skin: warm and dry, no rashes noted Neuro: alert and oriented x4, no focal deficits Psych: Normal affect and mood  ASSESSMENT/PLAN:   Constipation Back pain thought to be associated with constipation. - aggressively treating constipation with fiber supplements TID, suppositories, and in a few days adding senna. With goal to have BM at least every other day and no need to strain. We can adjust from there if needed. Also recommended good fluid intake which patient already does for her kidney function and to stay active. Provided handout for outdoor/home exercise ideas as patient was told by nephrologist not to go out to gyms/public for workouts. If back pain continued despite  regular BMs, will assess for other causes.   Symptomatic anemia hgb 10.0 today which is relatively stable. Recent readings were: 9.7, 10.5. Reviewed US findings with patient Follow up with gyn for treatment      [place]

## 2020-02-19 NOTE — Patient Instructions (Signed)
It was a pleasure to see you today!  To summarize our discussion for this visit:  I'm sorry to hear about your back pain. I believe that it is related to your constipation at this time. Let's treat your constipation aggressively and if your pain does not improve, we can look for other causes.   I am prescribing a fiber supplement that I'd like you to take three times per day this week as well as use a suppository once a day until you start to have a more regular bowel movement.   By Friday (approximately) take the senna medication once per day to try to regulate your BMs.  Message me if this regimen is not improving your constipation and we can escalate from there.  Physical activity can also help alleviate constipation so try to stay as active as you can!  Some additional health maintenance measures we should update are: Health Maintenance Due  Topic Date Due  . Hepatitis C Screening  Never done  . COVID-19 Vaccine (1) Never done  .   Please return to our clinic to see me if your symptoms are not improving.  Call the clinic at [phone] if your symptoms worsen or you have any concerns.   Thank you for allowing me to take part in your care,  Dr. Doristine Mango   The Journal of Orthopaedic and Sports Physical Therapy, 44(10), 748. [email or web address]">  How to Increase Your Level of Physical Activity Getting regular physical activity is important for your overall health and well-being. Most people do not get enough exercise. There are easy ways to increase your level of physical activity, even if you have not been very active in the past or if you are just starting out. How can increasing my physical activity affect me? Physical activity has many short-term and long-term benefits. Being active on a regular basis can improve your physical and mental health as well as provide other benefits. Physical health benefits  Helping you lose weight or maintain a  healthy weight.  Strengthening your muscles and bones.  Reducing your risk of certain long-term (chronic) diseases, including heart disease, cancer, and diabetes.  Being able to move around more easily and for longer periods of time without getting tired (increased stamina).  Improving your ability to fight off illness (enhanced immunity).  Being able to sleep better.  Helping you stay healthy as you get older, including: ? Helping you stay mobile, or capable of walking and moving around. ? Preventing accidents, such as falls. ? Increasing life expectancy. Mental health benefits  Boosting your mood and improving your self-esteem.  Lowering your chance of having mental health problems, such as depression or anxiety.  Helping you feel good about your body. Other benefits  Finding new sources of fun and enjoyment.  Meeting new people who share a common interest. What steps can I take to be more physically active? Getting started  If you have a chronic illness or have not been active for a while, check with your health care provider about how to get started. Ask your health care provider what activities are safe for you.  Start out slowly. Walking or doing some simple chair exercises is a good place to start, especially if you have not been active before or for a long time.  Set goals that you can work toward. Ask your health care provider how much exercise is best for you. In general, most adults should: ? Do moderate-intensity exercise for at least 150 minutes  each week (30 minutes on most days of the week) or vigorous exercise for at least 75 minutes each week, or a combination of these.  Moderate-intensity exercise can include walking at a quick pace, biking, yoga, water aerobics, or gardening.  Vigorous exercise involves activities that take more effort, such as jogging or running, playing sports, swimming laps, or jumping rope. ? Do strength exercises on at least 2 days  each week. This can include weight lifting, body weight exercises, and resistance-band exercises.  Consider using a fitness tracker, such as a mobile phone app or a device worn like a watch, that will count the number of steps you take each day. Many people strive to reach 10,000 steps a day. Choosing activities  Try to find activities that you enjoy. You are more likely to commit to an exercise routine if it does not feel like a chore.  If you have bone or joint problems, choose low-impact exercises, like walking or swimming.  Use these tips for being successful with an exercise plan: ? Find a workout partner for accountability. ? Join a group or class, such as an aerobics class, cycling class, or sports team. ? Make family time active. Go for a walk, bike, or swim. ? Include a variety of exercises each week. Being active in your daily routines Besides your formal exercise plans, you can find ways to do physical activity during your daily routines, such as:  Walking or biking to work or to the store.  Taking the stairs instead of the elevator.  Parking farther away from the door at work or at the store.  Planning walking meetings.  Walking around while you are on the phone.  Where to find more information  Centers for Disease Control and Prevention: WorkDashboard.es  President's Council on Fitness, Sports & Nutrition: www.fitness.gov  ChooseMyPlate: FormerBoss.no Contact a health care provider if:  You have headaches, muscle aches, or joint pain.  You feel dizzy or light-headed while exercising.  You faint.  You have chest pain while exercising. Summary  Exercise benefits your mind and body at any age, even if you are just starting out.  If you have a chronic illness or have not been active for a while, check with your health care provider before increasing your physical activity.  Choose activities that are safe and enjoyable for you. Ask your  health care provider what activities are safe for you.  Start slowly. Tell your health care provider if you have problems as you start to increase your activity level. This information is not intended to replace advice given to you by your health care provider. Make sure you discuss any questions you have with your health care provider. Document Revised: 05/15/2019 Document Reviewed: 03/06/2019 Elsevier Patient Education  [place].

## 2020-02-21 DIAGNOSIS — K5900 Constipation, unspecified: Secondary | ICD-10-CM | POA: Insufficient documentation

## 2020-02-21 NOTE — Assessment & Plan Note (Signed)
hgb 10.0 today which is relatively stable. Recent readings were: 9.7, 10.5. Reviewed US findings with patient Follow up with gyn for treatment

## 2020-02-21 NOTE — Assessment & Plan Note (Signed)
Back pain thought to be associated with constipation. - aggressively treating constipation with fiber supplements TID, suppositories, and in a few days adding senna. With goal to have BM at least every other day and no need to strain. We can adjust from there if needed. Also recommended good fluid intake which patient already does for her kidney function and to stay active. Provided handout for outdoor/home exercise ideas as patient was told by nephrologist not to go out to gyms/public for workouts. If back pain continued despite regular BMs, will assess for other causes.

## 2020-03-27 ENCOUNTER — Encounter: Payer: Self-pay | Admitting: Student in an Organized Health Care Education/Training Program

## 2020-03-27 ENCOUNTER — Other Ambulatory Visit: Payer: Self-pay

## 2020-03-27 ENCOUNTER — Ambulatory Visit (INDEPENDENT_AMBULATORY_CARE_PROVIDER_SITE_OTHER): Payer: Medicare Other | Admitting: Student in an Organized Health Care Education/Training Program

## 2020-03-27 VITALS — BP 94/72 | HR 103 | Wt 169.4 lb

## 2020-03-27 DIAGNOSIS — Z8719 Personal history of other diseases of the digestive system: Secondary | ICD-10-CM | POA: Diagnosis not present

## 2020-03-27 DIAGNOSIS — D649 Anemia, unspecified: Secondary | ICD-10-CM | POA: Diagnosis present

## 2020-03-27 DIAGNOSIS — R6889 Other general symptoms and signs: Secondary | ICD-10-CM

## 2020-03-27 LAB — POCT HEMOGLOBIN: Hemoglobin: 9.2 g/dL — AB (ref 11–14.6)

## 2020-03-27 NOTE — Progress Notes (Signed)
   SUBJECTIVE:   CHIEF COMPLAINT / HPI: f/u constipation, ill  Feeling ill:  5/19 heavy vaginal bleeding leading to symptomatic anemia.  Patient received pelvic ultrasound showing intramural uterine fibroid 2 cm 6/28 patient had appointment with me for constipation and anemia but overall felt fine 7/6 patient had routine follow-up at [facility] and was feeling unwell so they did a full work-up at that time that was largely normal and had hemoglobin 10.8. 7/30 patient began feeling even worse and began her menstrual cycle. 8/4 patient has appointment with me again and her menstrual cycle is lightening.  Hemoglobin today 9.2 She describes her symptoms as feeling really tired and having generalized fatigue.  She sleeps all day and becomes short of breath with exerting herself.  Very similar to previous episodes of anemia.  Denies chest pain, lower extremity edema, diaphoresis.  She has had unintentional weight loss of 4 pounds since last appointment due to decreased appetite.  Since Friday she has only been able to tolerate small amounts of food in conjunction with taking her medications.  However, she has drink large quantities of water up to a gallon per day.  She is experiencing nausea after eating but no vomiting.  Denies epigastric pain or right upper quadrant pain postprandially.  The symptoms have been going on about 6 weeks.  She takes omeprazole 20 mg twice daily.  Has never been seen by GI for refractory GERD.  Constipation: resolved. 3 BM yesterday. Some are soft and have been watery x2. Doesn't like the miralax.  Continues to take the senna as needed. Used the suppositories for about 1.5 weeks after our last appointment. Now eating craisins and drinking cranberry juice and pistachios to have good BMs. Has been regular.  PERTINENT  PMH / PSH: Post kidney transplant  OBJECTIVE:   BP 94/72   Pulse (!) 103   Wt 169 lb 6.4 oz (76.8 kg)   SpO2 98%   BMI 26.53 kg/m   General: NAD, pleasant, able  to participate in exam Cardiac: RRR, normal heart sounds, no murmurs. 2+ radial and PT pulses bilaterally HEENT: Nasal-oropharynx negative for edema, erythema, exudates. Conjunctiva neg for pallor Respiratory: CTAB, normal effort, No wheezes, rales or rhonchi Abdomen: soft, nontender, nondistended, no hepatic or splenomegaly, +BS Extremities: no edema or cyanosis. WWP. Skin: warm and dry, no rashes noted Neuro: alert and oriented x4, no focal deficits Psych: Normal affect and mood. Pressured speech   ASSESSMENT/PLAN:   Feeling unwell Patient has nonspecific symptoms which I suspect are multifactorial from anemia and poor p.o. intake.  Her vital signs are significant for lower blood pressure and tachycardia which would suggest dehydration.  Recent lab work was unremarkable including blood and urine evaluation.  Hemoglobin today is 9.2 which is decreased from a month ago at 10.8 associated with having heavy menstrual cycle this past few days.  However, she was feeling unwell before the onset of her menstrual cycle which could be related to her refractory GERD and poor p.o. intake.  Low suspicion for infection as there is no clear source but would remain cautious as patient is immunocompromised. - She is scheduled for follow-up labs tomorrow so I will hold off on checking more blood work today. -Recommend that she increase p.o. intake is much as tolerated and stay hydrated -Refer to GI for GERD evaluation - refer to OB/GYN to evaluate/treat her vaginal bleeding     [place]

## 2020-03-27 NOTE — Patient Instructions (Addendum)
It was a pleasure to see you today!  To summarize our discussion for this visit:  For your nausea, decreased appetite and weight loss, I would like you to see a GI specialist. Im putting in the referral today. Please let me know if you do not hear from them in the next week.  For your fatigue, we are checking your hemoglobin today which is 9.2.  I am also going to send you to your gynecologist to follow-up with your fibroid.  They may want to do some more testing or treatment to help prevent you from having bleeding that is causing your anemia symptoms.  Please continue to drink plenty of water and try to eat if you can tolerate it to help keep your strength and energy up.  Follow up with your labwork tomorrow.  Some additional health maintenance measures we should update are: Health Maintenance Due  Topic Date Due  . Hepatitis C Screening  Never done  . COVID-19 Vaccine (1) Never done  . INFLUENZA VACCINE  03/24/2020  .    Please return to our clinic to see me if your symptoms are not improved with seeing the specialists.  Call the clinic at [phone] if your symptoms worsen or you have any concerns.   Thank you for allowing me to take part in your care,  Dr. Doristine Mango

## 2020-03-28 DIAGNOSIS — R6889 Other general symptoms and signs: Secondary | ICD-10-CM | POA: Insufficient documentation

## 2020-03-28 NOTE — Assessment & Plan Note (Signed)
Patient has nonspecific symptoms which I suspect are multifactorial from anemia and poor p.o. intake.  Her vital signs are significant for lower blood pressure and tachycardia which would suggest dehydration.  Recent lab work was unremarkable including blood and urine evaluation.  Hemoglobin today is 9.2 which is decreased from a month ago at 10.8 associated with having heavy menstrual cycle this past few days.  However, she was feeling unwell before the onset of her menstrual cycle which could be related to her refractory GERD and poor p.o. intake.  Low suspicion for infection as there is no clear source but would remain cautious as patient is immunocompromised. - She is scheduled for follow-up labs tomorrow so I will hold off on checking more blood work today. -Recommend that she increase p.o. intake is much as tolerated and stay hydrated -Refer to GI for GERD evaluation - refer to OB/GYN to evaluate/treat her vaginal bleeding

## 2020-04-03 ENCOUNTER — Encounter: Payer: Self-pay | Admitting: Gastroenterology

## 2020-04-18 ENCOUNTER — Other Ambulatory Visit: Payer: Self-pay | Admitting: Student in an Organized Health Care Education/Training Program

## 2020-04-18 DIAGNOSIS — D649 Anemia, unspecified: Secondary | ICD-10-CM

## 2020-04-18 NOTE — Progress Notes (Signed)
Patient not feeling well. hgb decreased at last appointment. Still having bleeding.  Ordered POC hgb. If still decreasing and patient is symptomatic, would consider patient going to ED for transfusion. Will precept beforehand due to high volume EDs at this time.

## 2020-05-15 ENCOUNTER — Encounter (HOSPITAL_COMMUNITY): Payer: Self-pay

## 2020-05-15 ENCOUNTER — Other Ambulatory Visit: Payer: Self-pay

## 2020-05-15 ENCOUNTER — Emergency Department (HOSPITAL_COMMUNITY)
Admission: EM | Admit: 2020-05-15 | Discharge: 2020-05-16 | Disposition: A | Discharge status: Home / Self Care | Payer: Medicare Other | Attending: Emergency Medicine | Admitting: Emergency Medicine

## 2020-05-15 DIAGNOSIS — I120 Hypertensive chronic kidney disease with stage 5 chronic kidney disease or end stage renal disease: Secondary | ICD-10-CM | POA: Diagnosis not present

## 2020-05-15 DIAGNOSIS — Z992 Dependence on renal dialysis: Secondary | ICD-10-CM | POA: Insufficient documentation

## 2020-05-15 DIAGNOSIS — D75839 Thrombocytosis, unspecified: Secondary | ICD-10-CM

## 2020-05-15 DIAGNOSIS — R112 Nausea with vomiting, unspecified: Secondary | ICD-10-CM | POA: Diagnosis not present

## 2020-05-15 DIAGNOSIS — N186 End stage renal disease: Secondary | ICD-10-CM | POA: Diagnosis not present

## 2020-05-15 DIAGNOSIS — R599 Enlarged lymph nodes, unspecified: Secondary | ICD-10-CM | POA: Diagnosis not present

## 2020-05-15 DIAGNOSIS — D649 Anemia, unspecified: Secondary | ICD-10-CM | POA: Insufficient documentation

## 2020-05-15 DIAGNOSIS — D473 Essential (hemorrhagic) thrombocythemia: Secondary | ICD-10-CM | POA: Diagnosis not present

## 2020-05-15 DIAGNOSIS — Z940 Kidney transplant status: Secondary | ICD-10-CM | POA: Diagnosis not present

## 2020-05-15 DIAGNOSIS — R531 Weakness: Secondary | ICD-10-CM | POA: Diagnosis present

## 2020-05-15 DIAGNOSIS — E871 Hypo-osmolality and hyponatremia: Secondary | ICD-10-CM

## 2020-05-15 LAB — COMPREHENSIVE METABOLIC PANEL
ALT: 81 U/L — ABNORMAL HIGH (ref 0–44)
AST: 90 U/L — ABNORMAL HIGH (ref 15–41)
Albumin: 3 g/dL — ABNORMAL LOW (ref 3.5–5.0)
Alkaline Phosphatase: 444 U/L — ABNORMAL HIGH (ref 38–126)
Anion gap: 16 — ABNORMAL HIGH (ref 5–15)
BUN: 19 mg/dL (ref 6–20)
CO2: 21 mmol/L — ABNORMAL LOW (ref 22–32)
Calcium: 9.5 mg/dL (ref 8.9–10.3)
Chloride: 97 mmol/L — ABNORMAL LOW (ref 98–111)
Creatinine, Ser: 1.54 mg/dL — ABNORMAL HIGH (ref 0.44–1.00)
GFR calc Af Amer: 47 mL/min — ABNORMAL LOW (ref >60)
GFR calc non Af Amer: 41 mL/min — ABNORMAL LOW (ref >60)
Glucose, Bld: 134 mg/dL — ABNORMAL HIGH (ref 70–99)
Potassium: 3.6 mmol/L (ref 3.5–5.1)
Sodium: 134 mmol/L — ABNORMAL LOW (ref 135–145)
Total Bilirubin: 1.1 mg/dL (ref 0.3–1.2)
Total Protein: 7.2 g/dL (ref 6.5–8.1)

## 2020-05-15 LAB — CBC WITH DIFFERENTIAL/PLATELET
Abs Immature Granulocytes: 0.02 10*3/uL (ref 0.00–0.07)
Basophils Absolute: 0 10*3/uL (ref 0.0–0.1)
Basophils Relative: 0 %
Eosinophils Absolute: 0.1 10*3/uL (ref 0.0–0.5)
Eosinophils Relative: 1 %
HCT: 29 % — ABNORMAL LOW (ref 36.0–46.0)
Hemoglobin: 8.6 g/dL — ABNORMAL LOW (ref 12.0–15.0)
Immature Granulocytes: 0 %
Lymphocytes Relative: 21 %
Lymphs Abs: 1.6 10*3/uL (ref 0.7–4.0)
MCH: 24.9 pg — ABNORMAL LOW (ref 26.0–34.0)
MCHC: 29.7 g/dL — ABNORMAL LOW (ref 30.0–36.0)
MCV: 83.8 fL (ref 80.0–100.0)
Monocytes Absolute: 1 10*3/uL (ref 0.1–1.0)
Monocytes Relative: 13 %
Neutro Abs: 4.6 10*3/uL (ref 1.7–7.7)
Neutrophils Relative %: 65 %
Platelets: 487 10*3/uL — ABNORMAL HIGH (ref 150–400)
RBC: 3.46 MIL/uL — ABNORMAL LOW (ref 3.87–5.11)
RDW: 14.6 % (ref 11.5–15.5)
WBC: 7.3 10*3/uL (ref 4.0–10.5)
nRBC: 0 % (ref 0.0–0.2)

## 2020-05-15 LAB — I-STAT BETA HCG BLOOD, ED (MC, WL, AP ONLY): I-stat hCG, quantitative: <5 m[IU]/mL (ref <5)

## 2020-05-15 LAB — LACTIC ACID, PLASMA: Lactic Acid, Venous: 1.6 mmol/L (ref 0.5–1.9)

## 2020-05-15 NOTE — ED Triage Notes (Signed)
Pt reports generalized weakness and bilious emesis for the past few days, no vomiting in triage. Hx of kidney transplant 2 years ago. HR 133

## 2020-05-16 ENCOUNTER — Other Ambulatory Visit: Payer: Self-pay

## 2020-05-16 ENCOUNTER — Emergency Department (HOSPITAL_COMMUNITY): Payer: Medicare Other

## 2020-05-16 ENCOUNTER — Other Ambulatory Visit: Payer: Self-pay | Admitting: Student in an Organized Health Care Education/Training Program

## 2020-05-16 DIAGNOSIS — R590 Localized enlarged lymph nodes: Secondary | ICD-10-CM

## 2020-05-16 DIAGNOSIS — D649 Anemia, unspecified: Secondary | ICD-10-CM

## 2020-05-16 DIAGNOSIS — R112 Nausea with vomiting, unspecified: Secondary | ICD-10-CM | POA: Diagnosis not present

## 2020-05-16 LAB — URINALYSIS, ROUTINE W REFLEX MICROSCOPIC
Bilirubin Urine: NEGATIVE
Glucose, UA: NEGATIVE mg/dL
Hgb urine dipstick: NEGATIVE
Ketones, ur: NEGATIVE mg/dL
Leukocytes,Ua: NEGATIVE
Nitrite: NEGATIVE
Protein, ur: NEGATIVE mg/dL
Specific Gravity, Urine: 1.017 (ref 1.005–1.030)
pH: 5 (ref 5.0–8.0)

## 2020-05-16 IMAGING — CT CT ABD-PELV W/O CM
2 of 4 series · 15 of 46 positions shown, 17 images · non-contrast
Comparison: None.

CLINICAL DATA: Bilious emesis, renal transplant

EXAM:
CT ABDOMEN AND PELVIS WITHOUT CONTRAST
TECHNIQUE: Multidetector CT imaging of the abdomen and pelvis was performed
following the standard protocol without IV contrast.

[Series 3: a/p w/o 5mm · axial · non-contrast · 0.82mm/px · z∈[+808,+1228]mm · 12 of 96 slices shown, 14 images]
[im 8/96  soft-tissue]
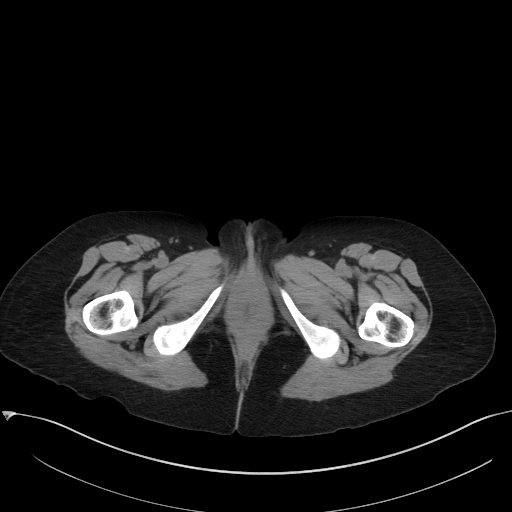
[im 8/96  bone]
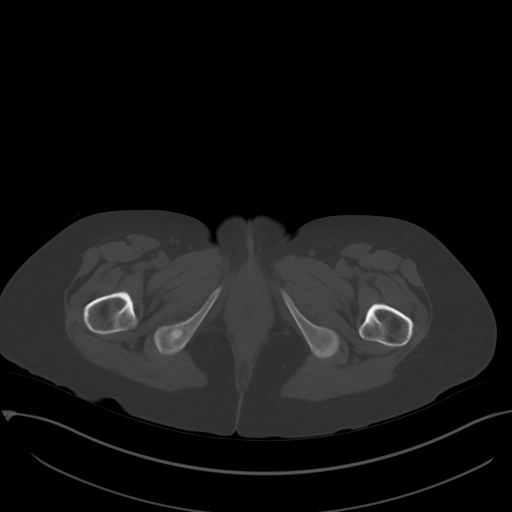
[im 16/96  soft-tissue]
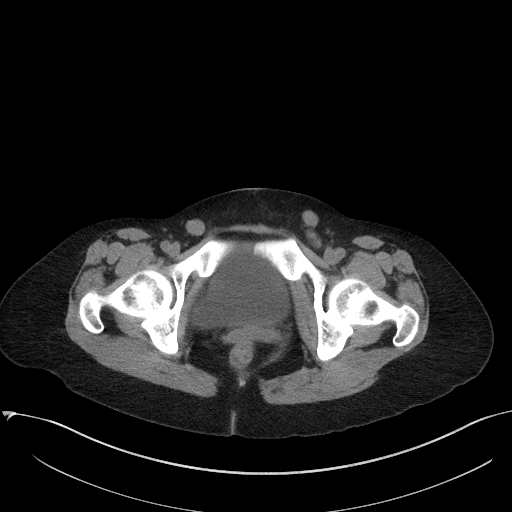
[im 23/96  soft-tissue]
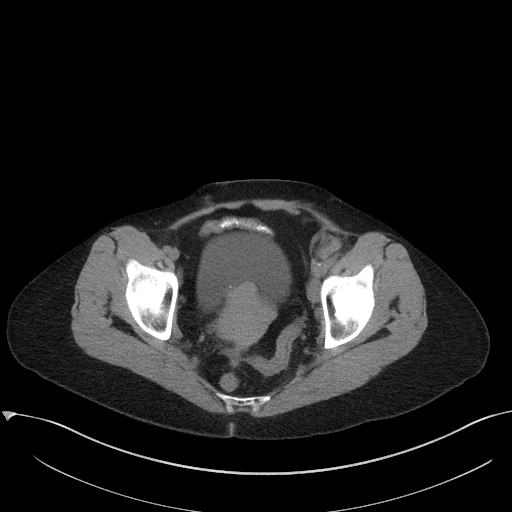
[im 31/96  soft-tissue]
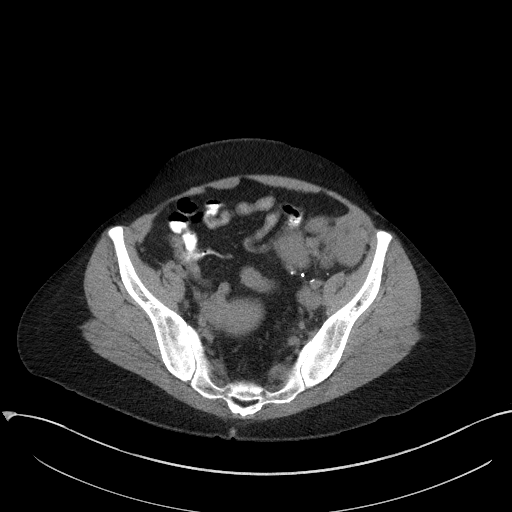
[im 39/96  soft-tissue]
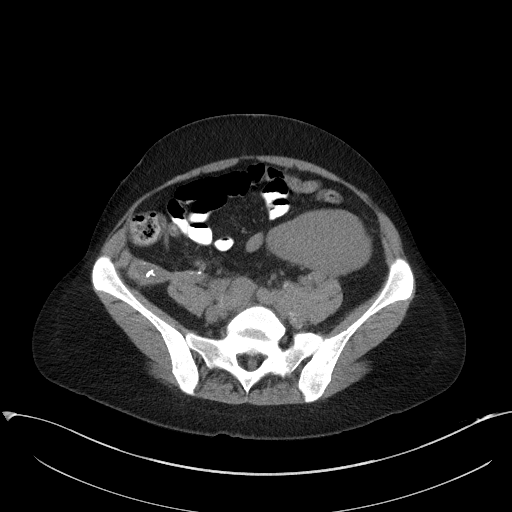
[im 46/96  soft-tissue]
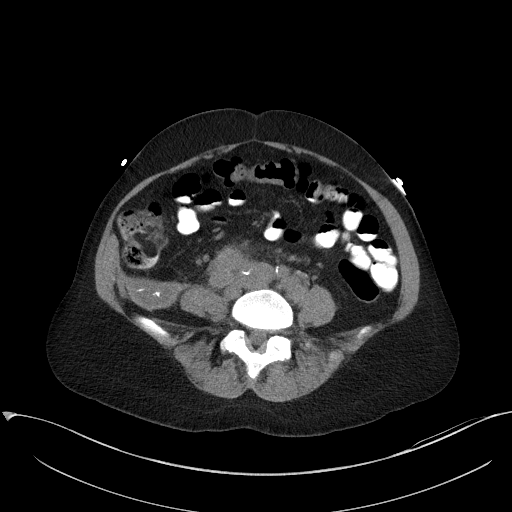
[im 54/96  soft-tissue]
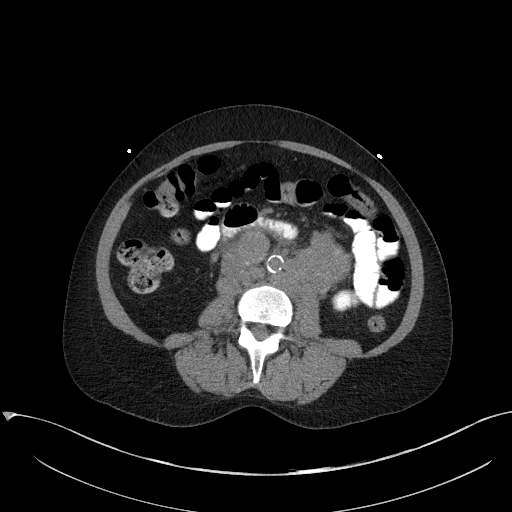
[im 61/96  soft-tissue]
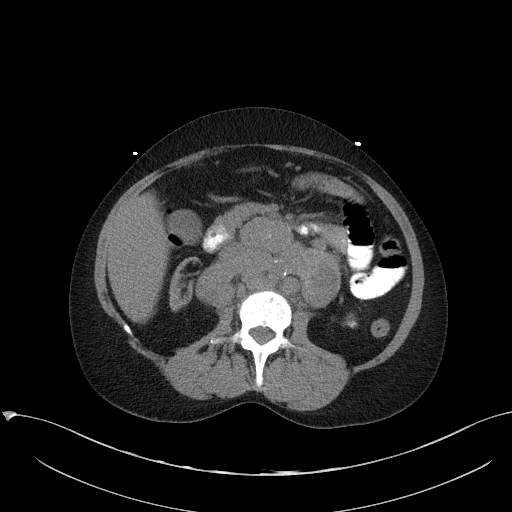
[im 69/96  soft-tissue]
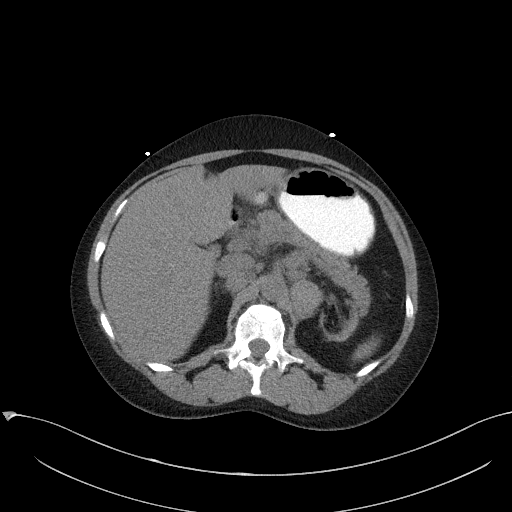
[im 69/96  bone]
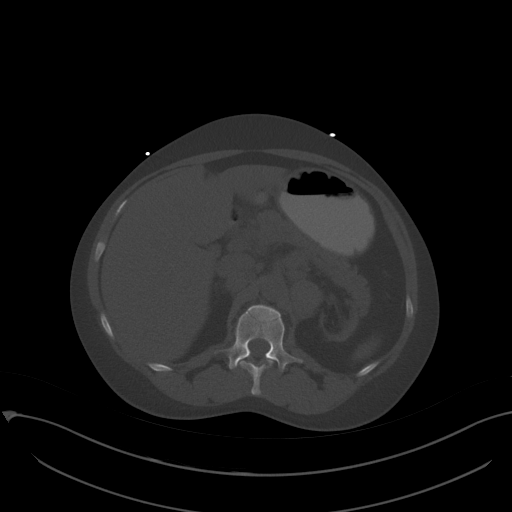
[im 77/96  soft-tissue]
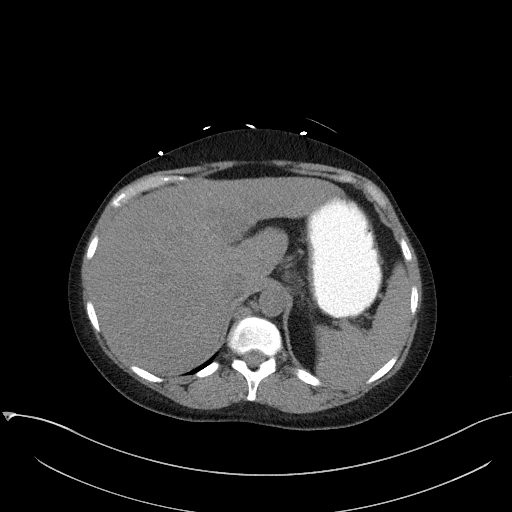
[im 84/96  soft-tissue]
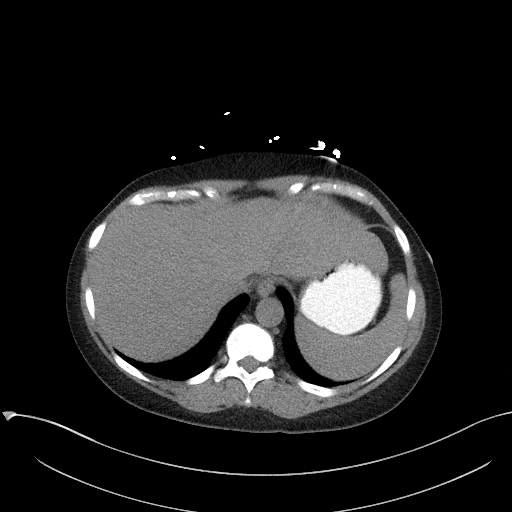
[im 92/96  soft-tissue]
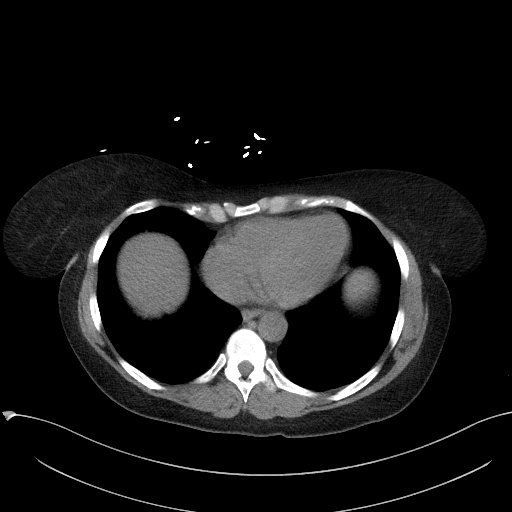

[Series 6: a/p w/o cor · coronal · non-contrast · 0.69mm/px · 3 of 128 slices shown]
[im 43/128  soft-tissue]
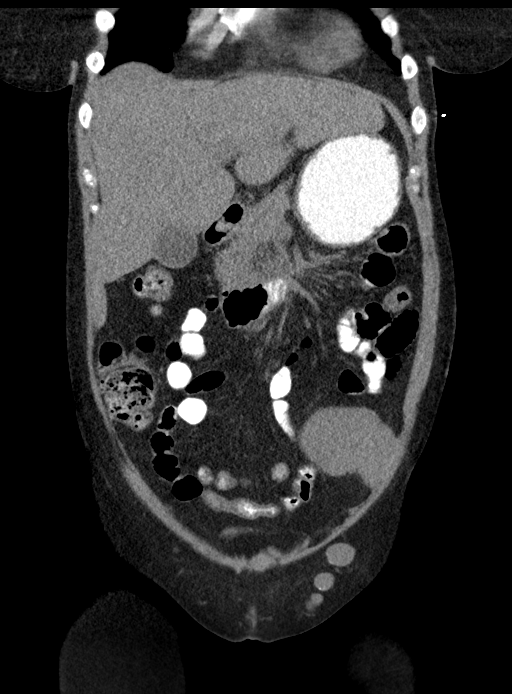
[im 57/128  soft-tissue]
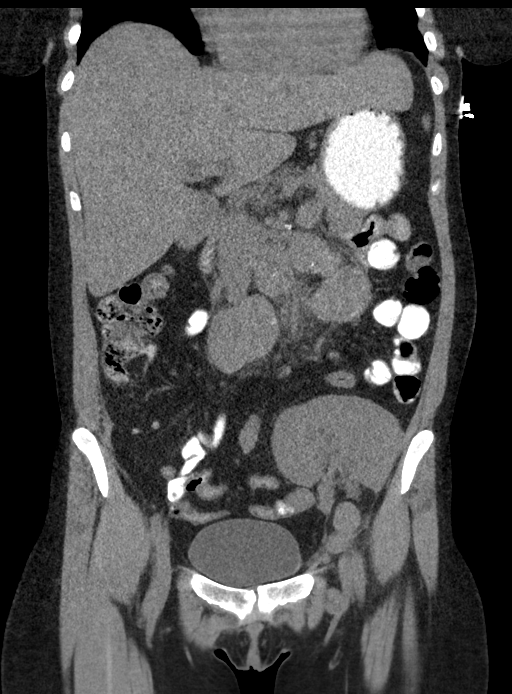
[im 71/128  soft-tissue]
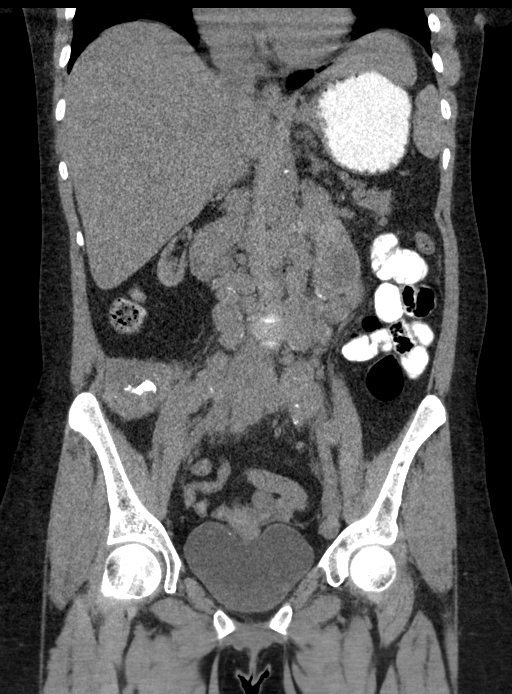

[15 of 46 positions shown; findings below may reference images not displayed]

FINDINGS: Lower chest: No acute abnormality.

Hepatobiliary: No focal liver abnormality is seen. No gallstones,
gallbladder wall thickening, or biliary dilatation.

Pancreas: Unremarkable

Spleen: Unremarkable

Adrenals/Urinary Tract: The adrenal glands are unremarkable. The
native kidneys are markedly atrophic in keeping with changes of
chronic renal failure. Previously noted right transplant kidney has
been surgically removed and a residual partially calcified complex
collection is seen within the resection bed likely representing a
postoperative complex seroma or chronic hematoma. A new transplant
kidney is seen within the left iliac fossa and is normal in size and
demonstrates normal cortical thickness. There is no hydronephrosis.
The bladder is unremarkable.

Stomach/Bowel: Stomach, small bowel, and large bowel are
unremarkable. Appendix normal. No free intraperitoneal gas or fluid.

Vascular/Lymphatic: Since the prior examination, there has developed
extensive pathologic adenopathy within the left periaortic,
aortocaval, and retrocaval lymph node groups, as well as the
bilateral common iliac, left external and bilateral inguinal lymph
node groups. Bulky adenopathy is compatible with lymphoma. Index
lymph node anterior to the abdominal aorta at axial image # 37/3
measures 2.4 x 4.2 cm in greatest dimension. Moderate aortoiliac
atherosclerotic calcification is present without evidence of
aneurysm.

Reproductive: Uterus and bilateral adnexa are unremarkable.

Other: Rectum unremarkable.

Musculoskeletal: Bone island within the right ischium. No lytic or
blastic bone lesions are seen.
IMPRESSION: Interval development of extensive retroperitoneal, pelvic, and
inguinal lymphadenopathy most in keeping with lymphoma in this
immunocompromised patient. Left inguinal and external iliac
lymphadenopathy should be easily amenable to ultrasound-guided
biopsy for further evaluation.

Interval explantation of right renal transplant and implantation of
new transplant kidney within the left iliac fossa.

Aortic Atherosclerosis ([XH]-[XH]).

## 2020-05-16 MED ORDER — LACTATED RINGERS IV BOLUS
1000.0000 mL | Freq: Once | INTRAVENOUS | Status: AC
  Administered 2020-05-16: 1000 mL via INTRAVENOUS

## 2020-05-16 MED ORDER — IOHEXOL 9 MG/ML PO SOLN
ORAL | Status: AC
  Administered 2020-05-16: 500 mL
  Filled 2020-05-16: qty 1000

## 2020-05-16 MED ORDER — ONDANSETRON HCL 4 MG PO TABS
4.0000 mg | ORAL_TABLET | Freq: Four times a day (QID) | ORAL | 0 refills | Status: DC | PRN
Start: 2020-05-16 — End: 2020-06-01

## 2020-05-16 MED ORDER — MORPHINE SULFATE (PF) 4 MG/ML IV SOLN
4.0000 mg | Freq: Once | INTRAVENOUS | Status: AC
  Administered 2020-05-16: 4 mg via INTRAVENOUS
  Filled 2020-05-16: qty 1

## 2020-05-16 MED ORDER — ONDANSETRON HCL 4 MG/2ML IJ SOLN
4.0000 mg | Freq: Once | INTRAMUSCULAR | Status: AC
  Administered 2020-05-16: 4 mg via INTRAVENOUS
  Filled 2020-05-16: qty 2

## 2020-05-16 MED ORDER — TRAMADOL HCL 50 MG PO TABS: 50.0000 mg | ORAL_TABLET | Freq: Four times a day (QID) | ORAL | 0 refills | Status: DC | PRN

## 2020-05-16 MED ORDER — IOHEXOL 9 MG/ML PO SOLN
500.0000 mL | ORAL | Status: AC
  Administered 2020-05-16 (×2): 500 mL via ORAL

## 2020-05-16 NOTE — ED Provider Notes (Signed)
[facility] EMERGENCY DEPARTMENT Provider Note   CSN: [id number] Arrival date & time: 05/15/20  1725   History Chief Complaint  Patient presents with  . Weakness  . Emesis    Candace Griffin is a 44 y.o. female.  The history is provided by the patient.  Weakness Associated symptoms: vomiting   Emesis She has history of hypertension, renal transplant and comes in because of vomiting and abdominal pain.  She states that for the last 2 weeks, she has had green emesis and pain in the right mid abdomen.  There is constant abdominal discomfort.  Abdominal pain does ease for about an hour following emesis.  There has been no bowel movement for the last 2 days, and only minimal amount of flatus.  Symptoms generally have gotten worse over the last 2 days.  She has lost 20 pounds during this episode.  She denies fever, chills, sweats.  Her abdominal pain does not radiate.  She has not done anything to treat her symptoms.  Past Medical History:  Diagnosis Date  . Chronic glomerulonephritis   . Dialysis patient ([place])   . End stage renal disease ([place])   . GERD (gastroesophageal reflux disease)   . History of renal transplant October 12, 2011  . Hypertension     Patient Active Problem List   Diagnosis Date Noted  . Feeling unwell 03/28/2020  . Constipation 02/21/2020  . Left arm pain 11/16/2019  . Symptomatic anemia 12/03/2017  . Low grade squamous intraepithelial lesion (LGSIL) on cervical Pap smear 05/03/2014  . Genital warts 07/31/2013  . Hypertension 04/21/2012  . Status post kidney transplant 01/07/2012  . Menorrhagia October 12, 2011  . GERD 10/10/2009  . GENITAL HERPES, HX OF 08/11/2007    Past Surgical History:  Procedure Laterality Date  . COLPOSCOPY  2016  . diaylsis shunt Right    arm  . KIDNEY TRANSPLANT  10/12/11   deceased donor kidney     OB History    Gravida  4   Para  3   Term  2   Preterm  1   AB  1   Living  3     SAB      TAB       Ectopic  1   Multiple      Live Births              Family History  Problem Relation Age of Onset  . Diabetes Father   . Diabetes Paternal Aunt   . Diabetes Paternal Grandmother   . Alcohol abuse Paternal Grandmother   . Alcohol abuse Paternal Uncle   . Cancer Maternal Grandmother        breast  . Breast cancer Maternal Grandmother   . Alcohol abuse Paternal Grandfather     Social History   Tobacco Use  . Smoking status: Never Smoker  . Smokeless tobacco: Never Used  Substance Use Topics  . Alcohol use: No  . Drug use: No    Home Medications Prior to Admission medications   Medication Sig Start Date End Date Taking? Authorizing Provider  cholecalciferol (VITAMIN D3) 25 MCG (1000 UNIT) tablet Take 1,000 Units by mouth daily.    [provider]  mycophenolate (CELLCEPT) 250 MG capsule Take 250 mg by mouth 2 (two) times daily.  07/19/18   [provider]  omeprazole (PRILOSEC) 20 MG capsule Take 20 mg by mouth 2 (two) times daily as needed (for heart burn).     [provider]  pravastatin (PRAVACHOL) 20 MG tablet Take 20 mg by mouth at bedtime.  05/29/19   [provider]  predniSONE (DELTASONE) 5 MG tablet Take 5 mg by mouth daily with breakfast.  07/25/19   [provider]  senna (SENOKOT) 8.6 MG TABS tablet Take 1 tablet (8.6 mg total) by mouth daily as needed for moderate constipation. 02/19/20   Anderson, Chelsey L, DO  Tacrolimus ER (ENVARSUS XR) 1 MG TB24 Take 3 mg by mouth daily. 12/01/19   [provider]    Allergies    Lisinopril, Other, and Tape  Review of Systems   Review of Systems  Gastrointestinal: Positive for vomiting.  Neurological: Positive for weakness.  All other systems reviewed and are negative.   Physical Exam Updated Vital Signs BP 90/64 (BP Location: Left Arm)   Pulse (!) 124   Temp 97.9 F (36.6 C) (Oral)   Resp 20   SpO2 99%   Physical Exam Vitals and nursing note reviewed.    44 year old female, resting comfortably and in no acute distress. Vital signs are significant for rapid heart rate and borderline blood pressure. Oxygen saturation is 99%, which is normal. Head is normocephalic and atraumatic. PERRLA, EOMI. Oropharynx is clear. Neck is nontender and supple without adenopathy or JVD. Back is nontender and there is no CVA tenderness. Lungs are clear without rales, wheezes, or rhonchi. Chest is nontender. Heart has regular rate and rhythm without murmur. Abdomen is soft, slightly distended, with moderate tenderness in the right mid abdomen.  Transplanted kidney is palpable in the left lower quadrant and is nontender.  There are no other masses or hepatosplenomegaly and peristalsis is hypoactive. Extremities have no cyanosis or edema, full range of motion is present. Skin is warm and dry without rash. Neurologic: Mental status is normal, cranial nerves are intact, there are no motor or sensory deficits.  ED Results / Procedures / Treatments   Labs (all labs ordered are listed, but only abnormal results are displayed) Labs Reviewed  COMPREHENSIVE METABOLIC PANEL - Abnormal; Notable for the following components:      Result Value   Sodium 134 (*)    Chloride 97 (*)    CO2 21 (*)    Glucose, Bld 134 (*)    Creatinine, Ser 1.54 (*)    Albumin 3.0 (*)    AST 90 (*)    ALT 81 (*)    Alkaline Phosphatase 444 (*)    GFR calc non Af Amer 41 (*)    GFR calc Af Amer 47 (*)    Anion gap 16 (*)    All other components within normal limits  CBC WITH DIFFERENTIAL/PLATELET - Abnormal; Notable for the following components:   RBC 3.46 (*)    Hemoglobin 8.6 (*)    HCT 29.0 (*)    MCH 24.9 (*)    MCHC 29.7 (*)    Platelets 487 (*)    All other components within normal limits  URINALYSIS, ROUTINE W REFLEX MICROSCOPIC - Abnormal; Notable for the following components:   Color, Urine AMBER (*)    APPearance HAZY (*)    All other components within normal limits   LACTIC ACID, PLASMA  I-STAT BETA HCG BLOOD, ED ([facility], [facility], [facility] ONLY)   Radiology CT ABDOMEN PELVIS WO CONTRAST  Result Date: 05/16/2020 CLINICAL DATA:  Bilious emesis, renal transplant EXAM: CT ABDOMEN AND PELVIS WITHOUT CONTRAST TECHNIQUE: Multidetector CT imaging of the abdomen and pelvis was performed following the  standard protocol without IV contrast. COMPARISON:  None. FINDINGS: Lower chest: No acute abnormality. Hepatobiliary: No focal liver abnormality is seen. No gallstones, gallbladder wall thickening, or biliary dilatation. Pancreas: Unremarkable Spleen: Unremarkable Adrenals/Urinary Tract: The adrenal glands are unremarkable. The native kidneys are markedly atrophic in keeping with changes of chronic renal failure. Previously noted right transplant kidney has been surgically removed and a residual partially calcified complex collection is seen within the resection bed likely representing a postoperative complex seroma or chronic hematoma. A new transplant kidney is seen within the left iliac fossa and is normal in size and demonstrates normal cortical thickness. There is no hydronephrosis. The bladder is unremarkable. Stomach/Bowel: Stomach, small bowel, and large bowel are unremarkable. Appendix normal. No free intraperitoneal gas or fluid. Vascular/Lymphatic: Since the prior examination, there has developed extensive pathologic adenopathy within the left periaortic, aortocaval, and retrocaval lymph node groups, as well as the bilateral common iliac, left external and bilateral inguinal lymph node groups. Bulky adenopathy is compatible with lymphoma. Index lymph node anterior to the abdominal aorta at axial image # 37/3 measures 2.4 x 4.2 cm in greatest dimension. Moderate aortoiliac atherosclerotic calcification is present without evidence of aneurysm. Reproductive: Uterus and bilateral adnexa are unremarkable. Other: Rectum unremarkable. Musculoskeletal: Bone island within the right ischium. No  lytic or blastic bone lesions are seen. IMPRESSION: Interval development of extensive retroperitoneal, pelvic, and inguinal lymphadenopathy most in keeping with lymphoma in this immunocompromised patient. Left inguinal and external iliac lymphadenopathy should be easily amenable to ultrasound-guided biopsy for further evaluation. Interval explantation of right renal transplant and implantation of new transplant kidney within the left iliac fossa. Aortic Atherosclerosis (ICD10-I70.0). Electronically Signed   By: Fidela Salisbury MD   On: 05/16/2020 05:49    Procedures Procedures   Medications Ordered in ED Medications  ondansetron [facility]) injection 4 mg (has no administration in time range)  morphine 4 MG/ML injection 4 mg (has no administration in time range)  lactated ringers bolus 1,000 mL (has no administration in time range)    ED Course  I have reviewed the triage vital signs and the nursing notes.  Pertinent labs & imaging results that were available during my care of the patient were reviewed by me and considered in my medical decision making (see chart for details).  MDM Rules/Calculators/A&P Abdominal pain with bilious emesis concerning for small bowel obstruction.  Consider diverticulitis, appendicitis, pancreatitis, cholecystitis.  Labs show creatinine of 1.54 which is within in the patient's usual range.  Also, there is moderate elevation of alkaline phosphatase and mild elevation of transaminases..  Moderate anemia is present, slightly worse that on 8/4.  She will be sent for CT of abdomen and pelvis, may need to follow with ultrasound depending on findings.  CT scan shows no evidence of obstruction, but extensive retroperitoneal lymphadenopathy is suggestive of lymphoma.  That would account for the patient's weight loss and may account for her elevated alkaline phosphatase as well.  I have explained the findings to the patient.  She will be referred back to her primary care provider  to arrange outpatient biopsy and appropriate oncology referral if it proves to be lymphoma.  She did get good relief of symptoms with morphine and ondansetron, she will be discharged with prescriptions for ondansetron and tramadol.  I have sent a secure chat message to her primary care provider in the family practice center to help expedite her outpatient work-up.  On return from CT scan, she continued to have a mild  tachycardia with heart rate 113.  She will be given additional liter of fluid prior to discharge.  Final Clinical Impression(s) / ED Diagnoses Final diagnoses:  Non-intractable vomiting with nausea, unspecified vomiting type  Adenopathy  Normocytic anemia  Hyponatremia  Thrombocytosis ([place])  Renal transplant recipient    Rx / DC Orders ED Discharge Orders         Ordered    traMADol (ULTRAM) 50 MG tablet  Every 6 hours PRN        05/16/20 0647    ondansetron (ZOFRAN) 4 MG tablet  Every 6 hours PRN        05/16/20 9796           Delora Fuel, MD 41/89/37 [phone]

## 2020-05-16 NOTE — ED Notes (Signed)
Off floor to CT 

## 2020-05-16 NOTE — Discharge Instructions (Addendum)
Your CT  scan showed a lot of enlarged lymph nodes. This may be a type of cancer called lymphoma. Please have your primary care provider set up a biopsy, so we can know if it is lymphoma, and treat it appropriately.  Return to the Emergency Department if you are having any problems.

## 2020-05-16 NOTE — ED Provider Notes (Signed)
  Physical Exam  BP 110/78   Pulse (!) 114   Temp 98.6 F (37 C) (Oral)   Resp (!) 21   Ht 5\' 7"  (1.702 m)   Wt 67.1 kg   SpO2 100%   BMI 23.18 kg/m   Physical Exam  ED Course/Procedures     Procedures  MDM  Patient feeling better after second liter of IV fluids.  Will discharge home.       Davonna Belling, MD 05/16/20 [phone]

## 2020-05-26 ENCOUNTER — Inpatient Hospital Stay (HOSPITAL_COMMUNITY)
Admission: EM | Admit: 2020-05-26 | Discharge: 2020-06-01 | DRG: 987 | Disposition: A | Discharge status: Home / Self Care | Payer: Medicare Other | Attending: Family Medicine | Admitting: Family Medicine

## 2020-05-26 ENCOUNTER — Encounter (HOSPITAL_COMMUNITY): Payer: Self-pay

## 2020-05-26 ENCOUNTER — Emergency Department (HOSPITAL_COMMUNITY): Payer: Medicare Other

## 2020-05-26 DIAGNOSIS — C642 Malignant neoplasm of left kidney, except renal pelvis: Principal | ICD-10-CM | POA: Diagnosis present

## 2020-05-26 DIAGNOSIS — E860 Dehydration: Secondary | ICD-10-CM | POA: Diagnosis not present

## 2020-05-26 DIAGNOSIS — N2889 Other specified disorders of kidney and ureter: Secondary | ICD-10-CM

## 2020-05-26 DIAGNOSIS — F411 Generalized anxiety disorder: Secondary | ICD-10-CM

## 2020-05-26 DIAGNOSIS — Z7952 Long term (current) use of systemic steroids: Secondary | ICD-10-CM

## 2020-05-26 DIAGNOSIS — E872 Acidosis: Secondary | ICD-10-CM | POA: Diagnosis present

## 2020-05-26 DIAGNOSIS — Y830 Surgical operation with transplant of whole organ as the cause of abnormal reaction of the patient, or of later complication, without mention of misadventure at the time of the procedure: Secondary | ICD-10-CM | POA: Diagnosis present

## 2020-05-26 DIAGNOSIS — T8619 Other complication of kidney transplant: Secondary | ICD-10-CM | POA: Diagnosis present

## 2020-05-26 DIAGNOSIS — N186 End stage renal disease: Secondary | ICD-10-CM | POA: Diagnosis present

## 2020-05-26 DIAGNOSIS — Z91048 Other nonmedicinal substance allergy status: Secondary | ICD-10-CM

## 2020-05-26 DIAGNOSIS — R000 Tachycardia, unspecified: Secondary | ICD-10-CM

## 2020-05-26 DIAGNOSIS — R160 Hepatomegaly, not elsewhere classified: Secondary | ICD-10-CM

## 2020-05-26 DIAGNOSIS — B370 Candidal stomatitis: Secondary | ICD-10-CM | POA: Diagnosis present

## 2020-05-26 DIAGNOSIS — Z79891 Long term (current) use of opiate analgesic: Secondary | ICD-10-CM

## 2020-05-26 DIAGNOSIS — R590 Localized enlarged lymph nodes: Secondary | ICD-10-CM | POA: Diagnosis present

## 2020-05-26 DIAGNOSIS — C787 Secondary malignant neoplasm of liver and intrahepatic bile duct: Secondary | ICD-10-CM

## 2020-05-26 DIAGNOSIS — A6009 Herpesviral infection of other urogenital tract: Secondary | ICD-10-CM | POA: Diagnosis present

## 2020-05-26 DIAGNOSIS — Z20822 Contact with and (suspected) exposure to covid-19: Secondary | ICD-10-CM | POA: Diagnosis present

## 2020-05-26 DIAGNOSIS — K219 Gastro-esophageal reflux disease without esophagitis: Secondary | ICD-10-CM | POA: Diagnosis present

## 2020-05-26 DIAGNOSIS — N179 Acute kidney failure, unspecified: Secondary | ICD-10-CM | POA: Diagnosis present

## 2020-05-26 DIAGNOSIS — D649 Anemia, unspecified: Secondary | ICD-10-CM | POA: Diagnosis present

## 2020-05-26 DIAGNOSIS — I120 Hypertensive chronic kidney disease with stage 5 chronic kidney disease or end stage renal disease: Secondary | ICD-10-CM | POA: Diagnosis present

## 2020-05-26 DIAGNOSIS — D630 Anemia in neoplastic disease: Secondary | ICD-10-CM | POA: Diagnosis present

## 2020-05-26 DIAGNOSIS — D631 Anemia in chronic kidney disease: Secondary | ICD-10-CM | POA: Diagnosis present

## 2020-05-26 DIAGNOSIS — Z992 Dependence on renal dialysis: Secondary | ICD-10-CM

## 2020-05-26 DIAGNOSIS — K5909 Other constipation: Secondary | ICD-10-CM | POA: Diagnosis present

## 2020-05-26 DIAGNOSIS — Z888 Allergy status to other drugs, medicaments and biological substances status: Secondary | ICD-10-CM

## 2020-05-26 DIAGNOSIS — C579 Malignant neoplasm of female genital organ, unspecified: Secondary | ICD-10-CM

## 2020-05-26 DIAGNOSIS — Z79899 Other long term (current) drug therapy: Secondary | ICD-10-CM

## 2020-05-26 DIAGNOSIS — R509 Fever, unspecified: Secondary | ICD-10-CM | POA: Diagnosis not present

## 2020-05-26 DIAGNOSIS — R599 Enlarged lymph nodes, unspecified: Secondary | ICD-10-CM

## 2020-05-26 DIAGNOSIS — R112 Nausea with vomiting, unspecified: Secondary | ICD-10-CM

## 2020-05-26 DIAGNOSIS — D84821 Immunodeficiency due to drugs: Secondary | ICD-10-CM | POA: Diagnosis present

## 2020-05-26 IMAGING — DX DG CHEST 1V PORT
1 series · 1 of 1 positions shown · non-contrast
Comparison: Chest x-ray [DATE].

CLINICAL DATA: Chest pain and vomiting

EXAM:
PORTABLE CHEST 1 VIEW

[chest ap]
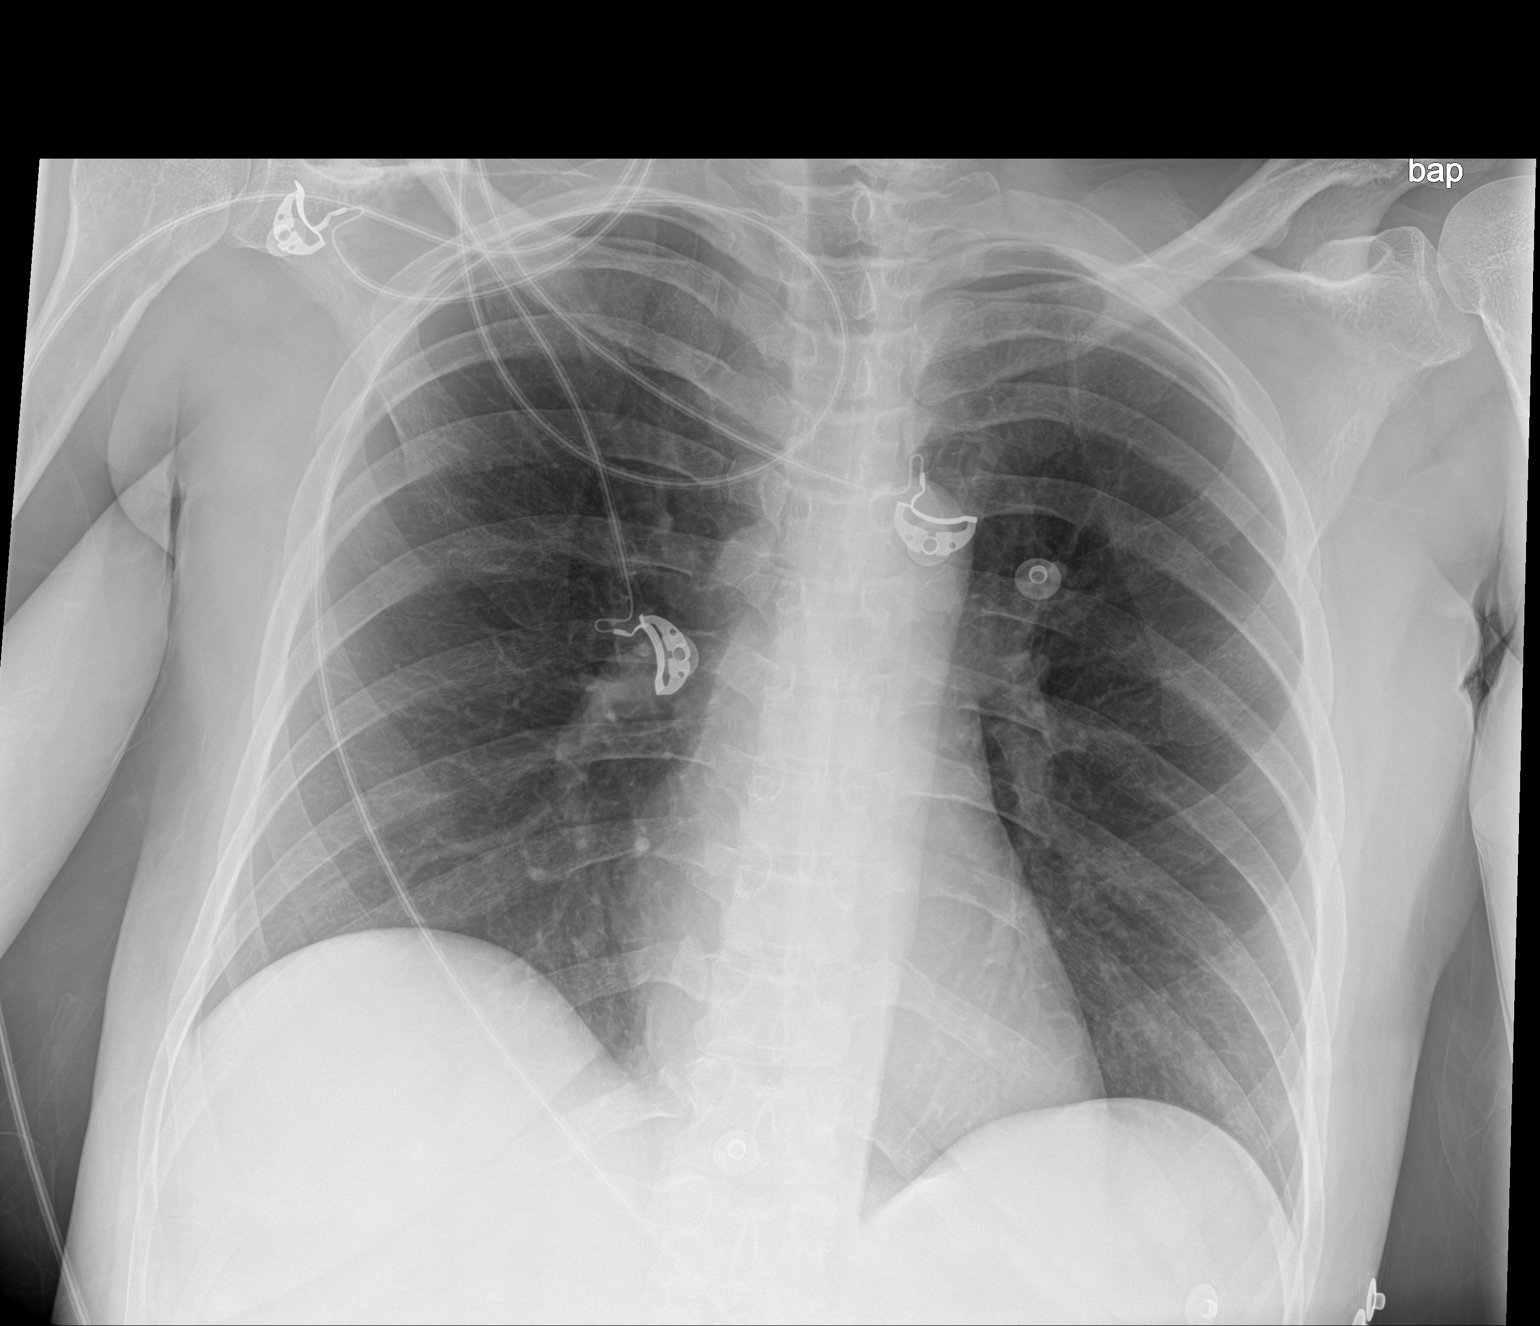

[1 of 1 positions shown; findings below may reference images not displayed]

FINDINGS: The heart size and mediastinal contours are within normal limits.

No focal consolidation. No pulmonary edema. No pleural effusion. No
pneumothorax.

No acute osseous abnormality.
IMPRESSION: No active disease.

## 2020-05-26 MED ORDER — SODIUM CHLORIDE 0.9 % IV SOLN
1000.0000 mL | INTRAVENOUS | Status: DC
  Administered 2020-05-26 – 2020-05-30 (×6): 1000 mL via INTRAVENOUS

## 2020-05-26 MED ORDER — SODIUM CHLORIDE 0.9 % IV BOLUS (SEPSIS)
1000.0000 mL | Freq: Once | INTRAVENOUS | Status: AC
  Administered 2020-05-26: 1000 mL via INTRAVENOUS

## 2020-05-26 MED ORDER — MORPHINE SULFATE (PF) 4 MG/ML IV SOLN
4.0000 mg | Freq: Once | INTRAVENOUS | Status: AC
  Administered 2020-05-26: 4 mg via INTRAVENOUS
  Filled 2020-05-26: qty 1

## 2020-05-26 NOTE — ED Triage Notes (Signed)
To triage via EMS from home.  Onset 2 days ago epigastric pain, vomiting, nausea, dysuria, and bilateral flank pain.  Left kidney transplant @ [facility] 2 years ago.   EMS gave Zofran 4mg  and NS 500 ml IVF.   EMS BP 128/86 CBG 140 HR 130  Has appt with Dr. Ouida Sills tomorrow.

## 2020-05-26 NOTE — ED Provider Notes (Signed)
[facility] EMERGENCY DEPARTMENT Provider Note   CSN: [id number] Arrival date & time: 05/26/20  2133     History Chief Complaint  Patient presents with  . Abdominal Pain  . Emesis    Candace [facility] is a 44 y.o. female with a hx of HTN, renal transport presents to the Emergency Department complaining of gradual, persistent, progressively worsening vomiting and abdominal pain onset Tuesday.  Pt reports she has had several weeks of mild abd pain and vomiting but was able to keep down fluids and medication. Evaluated in the ED on 9/22 for worsening symptoms.  Pt reports feeling better x3 days but began vomiting again despite the medication.  Pt reports Friday she attempted to eat and take medications, but had profuse vomiting. Since that time reports persistent vomiting and cannot keep down her medications.  This morning patient developed severe chest pain with shortness of breath.  Pt also reports associated abdominal distension and pain for the last week.  Pain is generalized and severe.  Eating or drinking makes all her pain worse.  She attempted to take TUMS without relief.  She reports severe fatigue.  Not vaccinated for COVID, but denies sick contacts.  Additionally patient reports low-grade fevers and night sweats.  Records reviewed: CT scan from 05/15/2020 showed significant widespread abdominal lymphadenopathy concerning for lymphoma.  Patient has not followed up for this yet.  She is unaware that this is the likely diagnosis.  The history is provided by the patient and medical records. No language interpreter was used.       Past Medical History:  Diagnosis Date  . Chronic glomerulonephritis   . Dialysis patient ([facility])   . End stage renal disease ([place])   . GERD (gastroesophageal reflux disease)   . History of renal transplant 10-22-2011  . Hypertension     Patient Active Problem List   Diagnosis Date Noted  . Feeling unwell 03/28/2020  . Constipation  02/21/2020  . Left arm pain 11/16/2019  . Symptomatic anemia 12/03/2017  . Low grade squamous intraepithelial lesion (LGSIL) on cervical Pap smear 05/03/2014  . Genital warts 07/31/2013  . Hypertension 04/21/2012  . Status post kidney transplant 01/07/2012  . Menorrhagia 2011-10-22  . GERD 10/10/2009  . GENITAL HERPES, HX OF 08/11/2007    Past Surgical History:  Procedure Laterality Date  . COLPOSCOPY  2016  . diaylsis shunt Right    arm  . KIDNEY TRANSPLANT  10/22/2011   deceased donor kidney     OB History    Gravida  4   Para  3   Term  2   Preterm  1   AB  1   Living  3     SAB      TAB      Ectopic  1   Multiple      Live Births              Family History  Problem Relation Age of Onset  . Diabetes Father   . Diabetes Paternal Aunt   . Diabetes Paternal Grandmother   . Alcohol abuse Paternal Grandmother   . Alcohol abuse Paternal Uncle   . Cancer Maternal Grandmother        breast  . Breast cancer Maternal Grandmother   . Alcohol abuse Paternal Grandfather     Social History   Tobacco Use  . Smoking status: Never Smoker  . Smokeless tobacco: Never Used  Substance Use Topics  . Alcohol  use: No  . Drug use: No    Home Medications Prior to Admission medications   Medication Sig Start Date End Date Taking? Authorizing Provider  cholecalciferol (VITAMIN D3) 25 MCG (1000 UNIT) tablet Take 1,000 Units by mouth daily.    [provider]  mycophenolate (CELLCEPT) 250 MG capsule Take 250 mg by mouth 2 (two) times daily.  07/19/18   [provider]  omeprazole (PRILOSEC) 20 MG capsule Take 20 mg by mouth 2 (two) times daily as needed (for heart burn).     [provider]  ondansetron (ZOFRAN) 4 MG tablet Take 1 tablet (4 mg total) by mouth every 6 (six) hours as needed for nausea or vomiting. 2/72/53   Delora Fuel, MD  pravastatin (PRAVACHOL) 20 MG tablet Take 20 mg by mouth at bedtime.  05/29/19   [provider]  predniSONE (DELTASONE) 5 MG tablet Take 5 mg by mouth daily with breakfast.  07/25/19   [provider]  senna (SENOKOT) 8.6 MG TABS tablet Take 1 tablet (8.6 mg total) by mouth daily as needed for moderate constipation. 02/19/20   Anderson, Chelsey L, DO  Tacrolimus ER (ENVARSUS XR) 1 MG TB24 Take 4 mg by mouth daily.  12/01/19   [provider]  traMADol (ULTRAM) 50 MG tablet Take 1 tablet (50 mg total) by mouth every 6 (six) hours as needed. 6/64/40   Delora Fuel, MD    Allergies    Lisinopril, Other, and Tape  Review of Systems   Review of Systems  Constitutional: Positive for fatigue and fever. Negative for appetite change, diaphoresis and unexpected weight change.  HENT: Negative for mouth sores.   Eyes: Negative for visual disturbance.  Respiratory: Negative for cough, chest tightness, shortness of breath and wheezing.   Cardiovascular: Positive for chest pain.  Gastrointestinal: Positive for abdominal distention, abdominal pain, nausea and vomiting. Negative for constipation and diarrhea.  Endocrine: Negative for polydipsia, polyphagia and polyuria.  Genitourinary: Negative for dysuria, frequency, hematuria and urgency.  Musculoskeletal: Negative for back pain and neck stiffness.  Skin: Negative for rash.  Allergic/Immunologic: Negative for immunocompromised state.  Neurological: Negative for syncope, light-headedness and headaches.  Hematological: Does not bruise/bleed easily.  Psychiatric/Behavioral: Negative for sleep disturbance. The patient is not nervous/anxious.     Physical Exam Updated Vital Signs BP (!) 128/95   Pulse 76   Temp 97.7 F (36.5 C) (Oral)   Resp 19   Ht 5\' 7"  (1.702 m)   Wt 64.9 kg   LMP 05/05/2020   SpO2 100%   BMI 22.40 kg/m   Physical Exam Vitals and nursing note reviewed.  Constitutional:      General: She is not in acute distress.    Appearance: She is ill-appearing. She is not diaphoretic.  HENT:     Head:  Normocephalic.  Eyes:     General: No scleral icterus.    Conjunctiva/sclera: Conjunctivae normal.  Cardiovascular:     Rate and Rhythm: Regular rhythm. Tachycardia present.     Pulses: Normal pulses.          Radial pulses are 2+ on the right side and 2+ on the left side.  Pulmonary:     Effort: Tachypnea present. No accessory muscle usage, prolonged expiration, respiratory distress or retractions.     Breath sounds: No stridor.     Comments: Equal chest rise. No increased work of breathing. Abdominal:     General: Bowel sounds are decreased. There is distension.  Palpations: Abdomen is soft.     Tenderness: There is abdominal tenderness in the epigastric area. There is guarding. There is no right CVA tenderness, left CVA tenderness or rebound.  Musculoskeletal:     Cervical back: Normal range of motion.     Comments: Moves all extremities equally and without difficulty.  Skin:    General: Skin is warm and dry.     Capillary Refill: Capillary refill takes less than 2 seconds.  Neurological:     Mental Status: She is alert.     GCS: GCS eye subscore is 4. GCS verbal subscore is 5. GCS motor subscore is 6.     Comments: Speech is clear and goal oriented.  Psychiatric:        Mood and Affect: Mood normal.     ED Results / Procedures / Treatments   Labs (all labs ordered are listed, but only abnormal results are displayed) Labs Reviewed  COMPREHENSIVE METABOLIC PANEL - Abnormal; Notable for the following components:      Result Value   CO2 17 (*)    Creatinine, Ser 1.13 (*)    Calcium 8.0 (*)    Total Protein 5.6 (*)    Albumin 2.0 (*)    AST 336 (*)    ALT 105 (*)    Alkaline Phosphatase 242 (*)    Total Bilirubin 1.5 (*)    GFR calc non Af Amer 59 (*)    All other components within normal limits  CBC - Abnormal; Notable for the following components:   RBC 1.59 (*)    Hemoglobin 4.0 (*)    HCT 14.0 (*)    MCH 25.2 (*)    MCHC 28.6 (*)    RDW 15.8 (*)    All  other components within normal limits  URINALYSIS, ROUTINE W REFLEX MICROSCOPIC - Abnormal; Notable for the following components:   Ketones, ur 20 (*)    All other components within normal limits  RESPIRATORY PANEL BY RT PCR (FLU A&B, COVID)  LIPASE, BLOOD  I-STAT BETA HCG BLOOD, ED ([facility], [facility], [facility] ONLY)  TYPE AND SCREEN  PREPARE RBC (CROSSMATCH)  TROPONIN I (HIGH SENSITIVITY)    EKG EKG Interpretation  Date/Time:  Sunday May 26 2020 22:41:22 EDT Ventricular Rate:  131 PR Interval:  140 QRS Duration: 66 QT Interval:  310 QTC Calculation: 457 R Axis:   39 Text Interpretation: Sinus tachycardia Biatrial enlargement Abnormal ECG Since last tracing rate faster Otherwise no significant change Confirmed by Deno Etienne [phone]) on 05/27/2020 1:09:45 AM   Radiology CT Angio Chest PE W and/or Wo Contrast  Result Date: 05/27/2020 CLINICAL DATA:  PE suspected, high probability EXAM: CT ANGIOGRAPHY CHEST WITH CONTRAST TECHNIQUE: Multidetector CT imaging of the chest was performed using the standard protocol during bolus administration of intravenous contrast. Multiplanar CT image reconstructions and MIPs were obtained to evaluate the vascular anatomy. CONTRAST:  48mL OMNIPAQUE IOHEXOL 350 MG/ML SOLN COMPARISON:  None. FINDINGS: Cardiovascular: Satisfactory opacification of the pulmonary arteries to the segmental level. No evidence of pulmonary embolism. Normal heart size. No pericardial effusion. Mediastinum/Nodes: Retrocrural lymphadenopathy, reference abdominal CT. Lungs/Pleura: 2 mm peripheral right upper lobe pulmonary nodule, attention on follow-up. There is no edema, consolidation, effusion, or pneumothorax. Upper Abdomen: Reported on dedicated study. Musculoskeletal: No acute or aggressive finding. Review of the MIP images confirms the above findings. IMPRESSION: Negative for pulmonary embolism or other acute finding. Electronically Signed   By: Monte Fantasia M.D.   On: 05/27/2020 06:02  CT  ABDOMEN PELVIS W CONTRAST  Result Date: 05/27/2020 CLINICAL DATA:  Abdominal distension.  Adenopathy. EXAM: CT ABDOMEN AND PELVIS WITH CONTRAST TECHNIQUE: Multidetector CT imaging of the abdomen and pelvis was performed using the standard protocol following bolus administration of intravenous contrast. CONTRAST:  16mL OMNIPAQUE IOHEXOL 350 MG/ML SOLN COMPARISON:  Noncontrast abdominal CT 05/16/2020 FINDINGS: Lower chest:  No contributory findings. Hepatobiliary: Multiple ill-defined masses within the liver. One of the largest is in the subcapsular inferior right lobe on 5:33 at 2 cm.No evidence of biliary obstruction or stone. Pancreas: Unremarkable. Spleen: Unremarkable. Adrenals/Urinary Tract: Negative adrenals. Native renal atrophy. Solid masslike appearance at the left renal hilum which measures 2.2 cm. There is a transplant kidney in the left lower quadrant with scarring involving the left-sided pole. There is pelviectasis without overt hydronephrosis. Failed transplant in the right lower quadrant. Unremarkable bladder. Stomach/Bowel: No obstruction. No inflammatory bowel wall thickening. Vascular/Lymphatic: Bulky retroperitoneal lymphadenopathy. The nodes show areas of low-density and calcification, which would be heterogeneous for untreated lymphoma. For measurement purposes a node ventral to the aorta and cava on 5:44 measures 42 x 26 mm. There is also bilateral inguinal lymphadenopathy including a left inguinal node measuring 16 mm long axis. Adenopathy continues to the retrocural station. Atherosclerotic calcification, premature for age. Reproductive:Intramural fibroids measuring up to 19 mm anteriorly. Other: No ascites or pneumoperitoneum. Musculoskeletal: No acute abnormalities. IMPRESSION: 1. Redemonstrated inguinal and intra-abdominal lymphadenopathy. With the benefit of contrast, multiple liver lesions are also seen. Findings could reflect metastatic disease, lymphoma, or posttransplant  lymphoproliferative disease. Left inguinal nodes are the most amenable to excisional biopsy. 2. 2.2 cm native left lower pole renal mass which may be related to#1. 3. Left lower quadrant transplant kidney with areas of cortical scarring. No hydronephrosis or perinephric collection. Electronically Signed   By: Monte Fantasia M.D.   On: 05/27/2020 06:16   DG Chest Port 1 View  Result Date: 05/26/2020 CLINICAL DATA:  Chest pain and vomiting EXAM: PORTABLE CHEST 1 VIEW COMPARISON:  Chest x-ray 02/03/2015. FINDINGS: The heart size and mediastinal contours are within normal limits. No focal consolidation. No pulmonary edema. No pleural effusion. No pneumothorax. No acute osseous abnormality. IMPRESSION: No active disease. Electronically Signed   By: Iven Finn M.D.   On: 05/26/2020 23:40    Procedures .Critical Care Performed by: Abigail Butts, PA-C Authorized by: Abigail Butts, PA-C   Critical care provider statement:    Critical care time (minutes):  45   Critical care time was exclusive of:  Separately billable procedures and treating other patients and teaching time   Critical care was necessary to treat or prevent imminent or life-threatening deterioration of the following conditions:  Circulatory failure   Critical care was time spent personally by me on the following activities:  Discussions with consultants, evaluation of patient's response to treatment, examination of patient, ordering and performing treatments and interventions, ordering and review of laboratory studies, ordering and review of radiographic studies, pulse oximetry, re-evaluation of patient's condition, obtaining history from patient or surrogate and review of old charts   I assumed direction of critical care for this patient from another provider in my specialty: no     (including critical care time)  Medications Ordered in ED Medications  sodium chloride 0.9 % bolus 1,000 mL (0 mLs Intravenous Stopped  05/27/20 0214)    Followed by  sodium chloride 0.9 % bolus 1,000 mL (0 mLs Intravenous Stopped 05/27/20 0214)    Followed by  0.9 %  sodium chloride infusion (1,000 mLs Intravenous New Bag/Given 05/26/20 2351)  morphine 4 MG/ML injection 4 mg (4 mg Intravenous Given 05/27/20 0438)  morphine 4 MG/ML injection 4 mg (4 mg Intravenous Given 05/26/20 2343)  0.9 %  sodium chloride infusion (0 mL/hr Intravenous Stopped 05/27/20 0438)  iohexol (OMNIPAQUE) 350 MG/ML injection 75 mL (75 mLs Intravenous Contrast Given 05/27/20 0540)    ED Course  I have reviewed the triage vital signs and the nursing notes.  Pertinent labs & imaging results that were available during my care of the patient were reviewed by me and considered in my medical decision making (see chart for details).  Clinical Course as of May 27 701  Mon May 27, 2020  0200 Noted.  Patient consented for blood transfusion  Hemoglobin(!!): 4.0 [HM]  0616  Retrocrural lymphadenopathy  CT Angio Chest PE W and/or Wo Contrast [HM]  0616 Tachycardia persists  Pulse Rate(!): 110 [HM]    Clinical Course User Index [HM] Kirsta Probert, Gwenlyn Perking   MDM Rules/Calculators/A&P                           Patient presents with abdominal pain, nausea, vomiting and chest pain.  She is tachycardic with dry mucous membranes.  Clearly dehydrated.  Fluids and pain medication ordered.  Given her recent evaluation with significant lymphadenopathy concern for worsening disease versus new pulmonary embolism today.  Will initiate work-up and reassess.  2:03 AM Patient continues to have severe chest pain.  She remains tachycardic.  CBC results with hemoglobin of 4.  Patient consented to blood transfusion.  She will need admission.  Creatinine 1.13 down from prior 1.5.  Will obtain CT a of the chest for evaluation of possible pulmonary embolism and/or sternal mass and CT scan of the abdomen for evaluation of her abdominal pain, abdominal distention and no  lymphadenopathy.  Concern for immunosuppressive lymphoma continues.  BP 122/83   Pulse (!) 120   Temp 97.7 F (36.5 C) (Oral)   Resp 19   Ht 5\' 7"  (1.702 m)   Wt 64.9 kg   LMP 05/05/2020   SpO2 100%   BMI 22.40 kg/m   The patient was discussed with and seen by Dr. Tyrone Nine who agrees with the treatment plan.  3:15 AM Worsening liver failure noted from slight elevation 2 weeks ago.  Blood pressure remained stable.  Patient remains tachycardic.  7:00 AM CT chest and abdomen is without pulmonary embolism.  Significant and worsening lymphadenopathy with liver masses.  Suspicious for worsening lymphoma.  I had a long discussion with patient about this.  She understands.  She will be admitted for severe anemia and lymphoma work-up.   Final Clinical Impression(s) / ED Diagnoses Final diagnoses:  Symptomatic anemia  Dehydration  Tachycardia  Non-intractable vomiting with nausea, unspecified vomiting type    Rx / DC Orders ED Discharge Orders    None       Nyzaiah Kai, Gwenlyn Perking 05/27/20 [place], DO 05/27/20 2259

## 2020-05-27 ENCOUNTER — Emergency Department (HOSPITAL_COMMUNITY): Payer: Medicare Other

## 2020-05-27 ENCOUNTER — Encounter (HOSPITAL_COMMUNITY): Payer: Self-pay | Admitting: Student in an Organized Health Care Education/Training Program

## 2020-05-27 ENCOUNTER — Other Ambulatory Visit: Payer: Self-pay

## 2020-05-27 ENCOUNTER — Inpatient Hospital Stay (HOSPITAL_COMMUNITY): Payer: Medicare Other

## 2020-05-27 ENCOUNTER — Ambulatory Visit: Payer: Medicare Other

## 2020-05-27 DIAGNOSIS — E872 Acidosis: Secondary | ICD-10-CM | POA: Diagnosis present

## 2020-05-27 DIAGNOSIS — R599 Enlarged lymph nodes, unspecified: Secondary | ICD-10-CM

## 2020-05-27 DIAGNOSIS — N186 End stage renal disease: Secondary | ICD-10-CM | POA: Diagnosis present

## 2020-05-27 DIAGNOSIS — R160 Hepatomegaly, not elsewhere classified: Secondary | ICD-10-CM | POA: Diagnosis not present

## 2020-05-27 DIAGNOSIS — E860 Dehydration: Secondary | ICD-10-CM | POA: Diagnosis present

## 2020-05-27 DIAGNOSIS — C642 Malignant neoplasm of left kidney, except renal pelvis: Secondary | ICD-10-CM | POA: Diagnosis present

## 2020-05-27 DIAGNOSIS — A6009 Herpesviral infection of other urogenital tract: Secondary | ICD-10-CM | POA: Diagnosis present

## 2020-05-27 DIAGNOSIS — T8619 Other complication of kidney transplant: Secondary | ICD-10-CM | POA: Diagnosis present

## 2020-05-27 DIAGNOSIS — N2889 Other specified disorders of kidney and ureter: Secondary | ICD-10-CM | POA: Diagnosis not present

## 2020-05-27 DIAGNOSIS — Z940 Kidney transplant status: Secondary | ICD-10-CM | POA: Diagnosis not present

## 2020-05-27 DIAGNOSIS — Z992 Dependence on renal dialysis: Secondary | ICD-10-CM | POA: Diagnosis not present

## 2020-05-27 DIAGNOSIS — N179 Acute kidney failure, unspecified: Secondary | ICD-10-CM | POA: Diagnosis present

## 2020-05-27 DIAGNOSIS — D649 Anemia, unspecified: Secondary | ICD-10-CM | POA: Diagnosis not present

## 2020-05-27 DIAGNOSIS — R509 Fever, unspecified: Secondary | ICD-10-CM | POA: Diagnosis not present

## 2020-05-27 DIAGNOSIS — Y830 Surgical operation with transplant of whole organ as the cause of abnormal reaction of the patient, or of later complication, without mention of misadventure at the time of the procedure: Secondary | ICD-10-CM | POA: Diagnosis present

## 2020-05-27 DIAGNOSIS — Z20822 Contact with and (suspected) exposure to covid-19: Secondary | ICD-10-CM | POA: Diagnosis present

## 2020-05-27 DIAGNOSIS — R591 Generalized enlarged lymph nodes: Secondary | ICD-10-CM | POA: Diagnosis not present

## 2020-05-27 DIAGNOSIS — I120 Hypertensive chronic kidney disease with stage 5 chronic kidney disease or end stage renal disease: Secondary | ICD-10-CM | POA: Diagnosis present

## 2020-05-27 DIAGNOSIS — Z91048 Other nonmedicinal substance allergy status: Secondary | ICD-10-CM | POA: Diagnosis not present

## 2020-05-27 DIAGNOSIS — R590 Localized enlarged lymph nodes: Secondary | ICD-10-CM | POA: Diagnosis present

## 2020-05-27 DIAGNOSIS — B370 Candidal stomatitis: Secondary | ICD-10-CM | POA: Diagnosis present

## 2020-05-27 DIAGNOSIS — D84821 Immunodeficiency due to drugs: Secondary | ICD-10-CM | POA: Diagnosis present

## 2020-05-27 DIAGNOSIS — C787 Secondary malignant neoplasm of liver and intrahepatic bile duct: Secondary | ICD-10-CM | POA: Diagnosis present

## 2020-05-27 DIAGNOSIS — D630 Anemia in neoplastic disease: Secondary | ICD-10-CM | POA: Diagnosis present

## 2020-05-27 DIAGNOSIS — Z7952 Long term (current) use of systemic steroids: Secondary | ICD-10-CM | POA: Diagnosis not present

## 2020-05-27 DIAGNOSIS — Z79899 Other long term (current) drug therapy: Secondary | ICD-10-CM | POA: Diagnosis not present

## 2020-05-27 DIAGNOSIS — Z79891 Long term (current) use of opiate analgesic: Secondary | ICD-10-CM | POA: Diagnosis not present

## 2020-05-27 DIAGNOSIS — K5909 Other constipation: Secondary | ICD-10-CM | POA: Diagnosis present

## 2020-05-27 DIAGNOSIS — D631 Anemia in chronic kidney disease: Secondary | ICD-10-CM | POA: Diagnosis present

## 2020-05-27 DIAGNOSIS — Z888 Allergy status to other drugs, medicaments and biological substances status: Secondary | ICD-10-CM | POA: Diagnosis not present

## 2020-05-27 DIAGNOSIS — K219 Gastro-esophageal reflux disease without esophagitis: Secondary | ICD-10-CM | POA: Diagnosis present

## 2020-05-27 LAB — CBC WITH DIFFERENTIAL/PLATELET
Abs Immature Granulocytes: 0.05 10*3/uL (ref 0.00–0.07)
Basophils Absolute: 0 10*3/uL (ref 0.0–0.1)
Basophils Relative: 0 %
Eosinophils Absolute: 0.1 10*3/uL (ref 0.0–0.5)
Eosinophils Relative: 1 %
HCT: 36.6 % (ref 36.0–46.0)
Hemoglobin: 10.9 g/dL — ABNORMAL LOW (ref 12.0–15.0)
Immature Granulocytes: 1 %
Lymphocytes Relative: 13 %
Lymphs Abs: 1 10*3/uL (ref 0.7–4.0)
MCH: 24.5 pg — ABNORMAL LOW (ref 26.0–34.0)
MCHC: 29.8 g/dL — ABNORMAL LOW (ref 30.0–36.0)
MCV: 82.4 fL (ref 80.0–100.0)
Monocytes Absolute: 1.1 10*3/uL — ABNORMAL HIGH (ref 0.1–1.0)
Monocytes Relative: 14 %
Neutro Abs: 5.6 10*3/uL (ref 1.7–7.7)
Neutrophils Relative %: 71 %
Platelets: 350 10*3/uL (ref 150–400)
RBC: 4.44 MIL/uL (ref 3.87–5.11)
RDW: 16 % — ABNORMAL HIGH (ref 11.5–15.5)
WBC: 7.9 10*3/uL (ref 4.0–10.5)
nRBC: 0 % (ref 0.0–0.2)

## 2020-05-27 LAB — CBC
HCT: 14 % — ABNORMAL LOW (ref 36.0–46.0)
HCT: 35.4 % — ABNORMAL LOW (ref 36.0–46.0)
Hemoglobin: 4 g/dL — CL (ref 12.0–15.0)
Hemoglobin: 10.6 g/dL — ABNORMAL LOW (ref 12.0–15.0)
MCH: 25.2 pg — ABNORMAL LOW (ref 26.0–34.0)
MCH: 24.8 pg — ABNORMAL LOW (ref 26.0–34.0)
MCHC: 28.6 g/dL — ABNORMAL LOW (ref 30.0–36.0)
MCHC: 29.9 g/dL — ABNORMAL LOW (ref 30.0–36.0)
MCV: 88.1 fL (ref 80.0–100.0)
MCV: 82.9 fL (ref 80.0–100.0)
Platelets: 376 10*3/uL (ref 150–400)
Platelets: 299 10*3/uL (ref 150–400)
RBC: 1.59 MIL/uL — ABNORMAL LOW (ref 3.87–5.11)
RBC: 4.27 MIL/uL (ref 3.87–5.11)
RDW: 15.8 % — ABNORMAL HIGH (ref 11.5–15.5)
RDW: 16.7 % — ABNORMAL HIGH (ref 11.5–15.5)
WBC: 7.6 10*3/uL (ref 4.0–10.5)
WBC: 7.6 10*3/uL (ref 4.0–10.5)
nRBC: 0 % (ref 0.0–0.2)
nRBC: 0 % (ref 0.0–0.2)

## 2020-05-27 LAB — CREATININE, SERUM
Creatinine, Ser: 1.19 mg/dL — ABNORMAL HIGH (ref 0.44–1.00)
GFR calc Af Amer: >60 mL/min (ref >60)
GFR calc non Af Amer: 56 mL/min — ABNORMAL LOW (ref >60)

## 2020-05-27 LAB — URINALYSIS, ROUTINE W REFLEX MICROSCOPIC
Bilirubin Urine: NEGATIVE
Glucose, UA: NEGATIVE mg/dL
Hgb urine dipstick: NEGATIVE
Ketones, ur: 20 mg/dL — AB
Leukocytes,Ua: NEGATIVE
Nitrite: NEGATIVE
Protein, ur: NEGATIVE mg/dL
Specific Gravity, Urine: 1.02 (ref 1.005–1.030)
pH: 5 (ref 5.0–8.0)

## 2020-05-27 LAB — COMPREHENSIVE METABOLIC PANEL
ALT: 105 U/L — ABNORMAL HIGH (ref 0–44)
AST: 336 U/L — ABNORMAL HIGH (ref 15–41)
Albumin: 2 g/dL — ABNORMAL LOW (ref 3.5–5.0)
Alkaline Phosphatase: 242 U/L — ABNORMAL HIGH (ref 38–126)
Anion gap: 13 (ref 5–15)
BUN: 17 mg/dL (ref 6–20)
CO2: 17 mmol/L — ABNORMAL LOW (ref 22–32)
Calcium: 8 mg/dL — ABNORMAL LOW (ref 8.9–10.3)
Chloride: 107 mmol/L (ref 98–111)
Creatinine, Ser: 1.13 mg/dL — ABNORMAL HIGH (ref 0.44–1.00)
GFR calc Af Amer: >60 mL/min (ref >60)
GFR calc non Af Amer: 59 mL/min — ABNORMAL LOW (ref >60)
Glucose, Bld: 96 mg/dL (ref 70–99)
Potassium: 4.4 mmol/L (ref 3.5–5.1)
Sodium: 137 mmol/L (ref 135–145)
Total Bilirubin: 1.5 mg/dL — ABNORMAL HIGH (ref 0.3–1.2)
Total Protein: 5.6 g/dL — ABNORMAL LOW (ref 6.5–8.1)

## 2020-05-27 LAB — HIV ANTIBODY (ROUTINE TESTING W REFLEX): HIV Screen 4th Generation wRfx: NONREACTIVE

## 2020-05-27 LAB — TROPONIN I (HIGH SENSITIVITY): Troponin I (High Sensitivity): 13 ng/L (ref <18)

## 2020-05-27 LAB — DIRECT ANTIGLOBULIN TEST (NOT AT ARMC)
DAT, IgG: NEGATIVE
DAT, complement: NEGATIVE

## 2020-05-27 LAB — RESPIRATORY PANEL BY RT PCR (FLU A&B, COVID)
Influenza A by PCR: NEGATIVE
Influenza B by PCR: NEGATIVE
SARS Coronavirus 2 by RT PCR: NEGATIVE

## 2020-05-27 LAB — VITAMIN B12: Vitamin B-12: 462 pg/mL (ref 180–914)

## 2020-05-27 LAB — RETICULOCYTES
Immature Retic Fract: 12.7 % (ref 2.3–15.9)
RBC.: 3.94 MIL/uL (ref 3.87–5.11)
Retic Count, Absolute: 54.4 10*3/uL (ref 19.0–186.0)
Retic Ct Pct: 1.4 % (ref 0.4–3.1)

## 2020-05-27 LAB — HEPATITIS B SURFACE ANTIGEN: Hepatitis B Surface Ag: NONREACTIVE

## 2020-05-27 LAB — LIPASE, BLOOD: Lipase: 35 U/L (ref 11–51)

## 2020-05-27 LAB — I-STAT BETA HCG BLOOD, ED (MC, WL, AP ONLY): I-stat hCG, quantitative: <5 m[IU]/mL (ref <5)

## 2020-05-27 LAB — IRON AND TIBC
Iron: 27 ug/dL — ABNORMAL LOW (ref 28–170)
Saturation Ratios: 15 % (ref 10.4–31.8)
TIBC: 179 ug/dL — ABNORMAL LOW (ref 250–450)
UIBC: 152 ug/dL

## 2020-05-27 LAB — FERRITIN: Ferritin: 6152 ng/mL — ABNORMAL HIGH (ref 11–307)

## 2020-05-27 LAB — PREPARE RBC (CROSSMATCH)

## 2020-05-27 LAB — SEDIMENTATION RATE: Sed Rate: 71 mm/hr — ABNORMAL HIGH (ref 0–22)

## 2020-05-27 LAB — C-REACTIVE PROTEIN: CRP: 13.3 mg/dL — ABNORMAL HIGH (ref <1.0)

## 2020-05-27 LAB — HEPATITIS B CORE ANTIBODY, TOTAL: Hep B Core Total Ab: NONREACTIVE

## 2020-05-27 IMAGING — CT CT ANGIO CHEST
2 of 7 series · 19 of 46 positions shown · IV contrast (omnipaque)
Comparison: None.

CLINICAL DATA: PE suspected, high probability

EXAM:
CT ANGIOGRAPHY CHEST WITH CONTRAST
TECHNIQUE: Multidetector CT imaging of the chest was performed using the
standard protocol during bolus administration of intravenous
contrast. Multiplanar CT image reconstructions and MIPs were
obtained to evaluate the vascular anatomy.
CONTRAST:  75mL OMNIPAQUE IOHEXOL 350 MG/ML SOLN

[Series 5: thins · axial · 0.68mm/px · z∈[+1162,+1411]mm · 16 of 401 slices shown]
[im 23/401  lung]
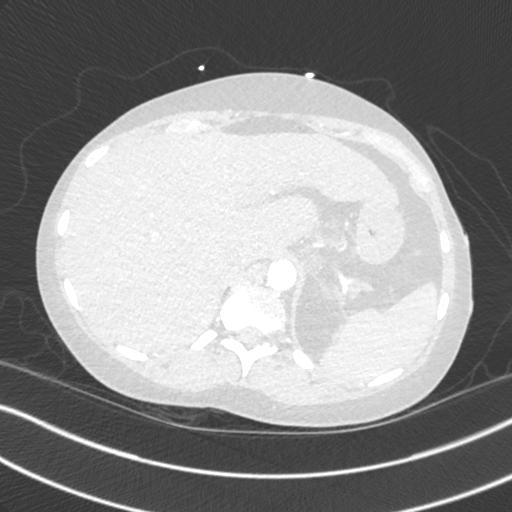
[im 45/401  soft-tissue]
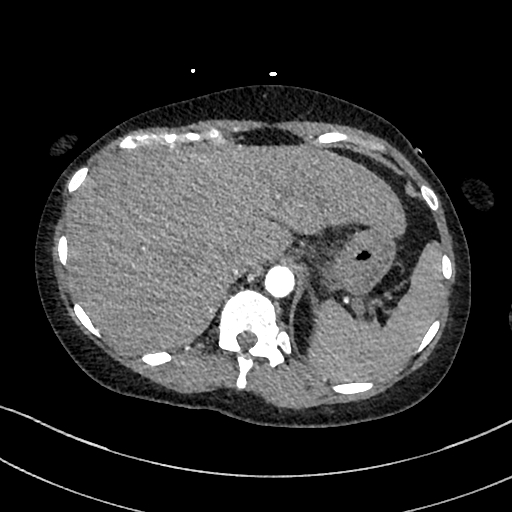
[im 67/401  lung]
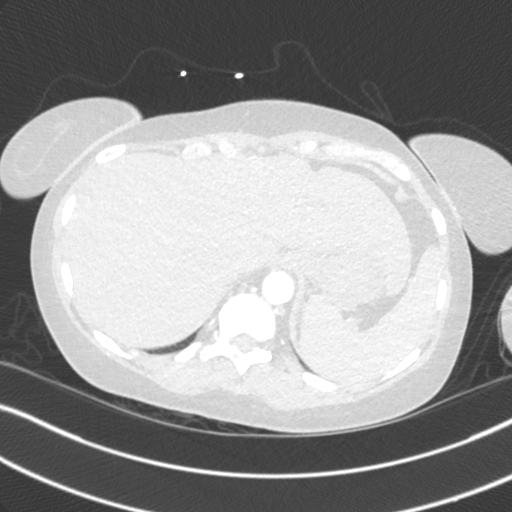
[im 89/401  soft-tissue]
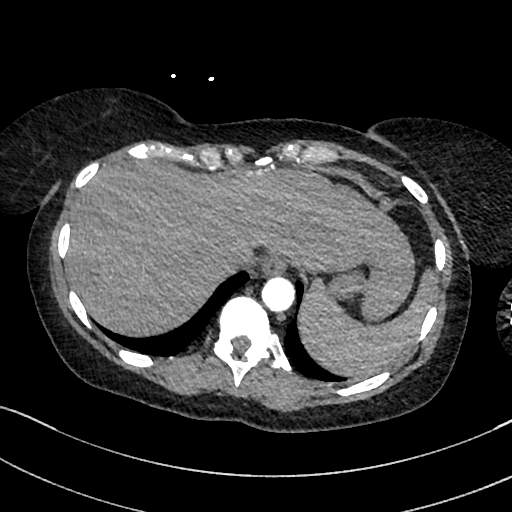
[im 112/401  lung]
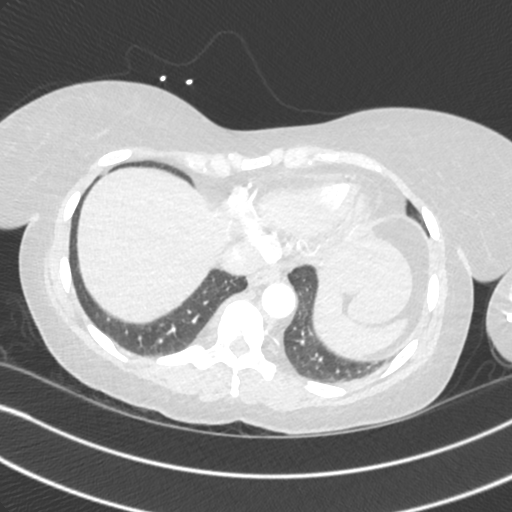
[im 134/401  soft-tissue]
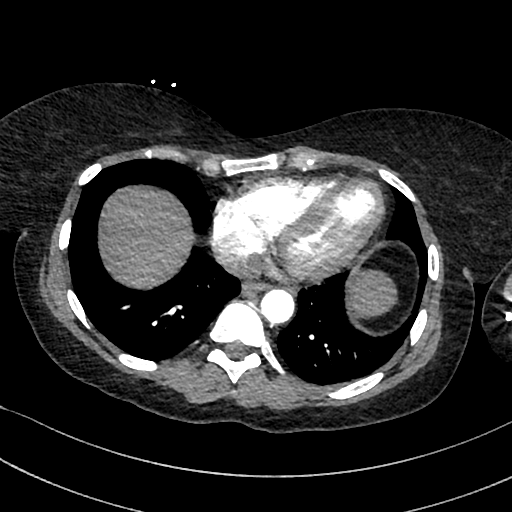
[im 156/401  lung]
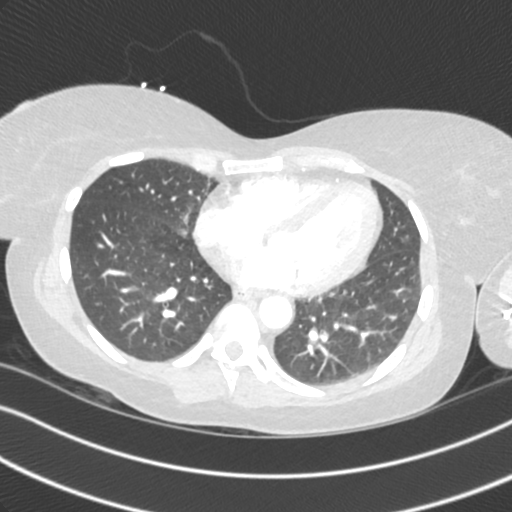
[im 178/401  soft-tissue]
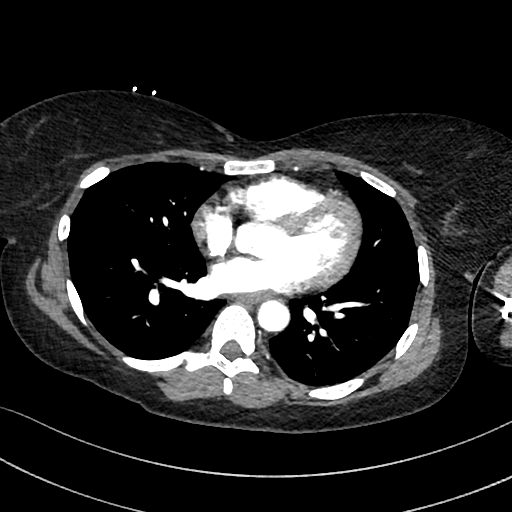
[im 223/401  lung]
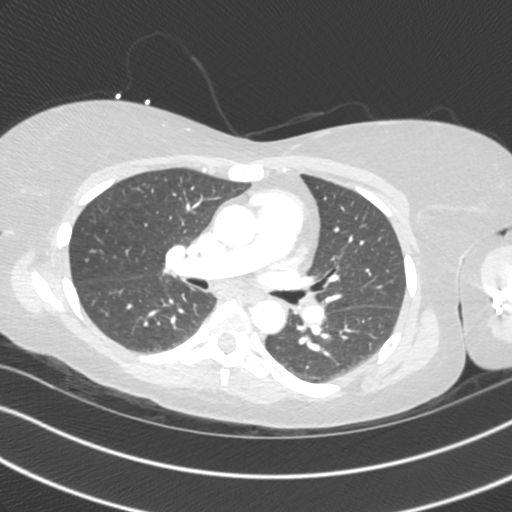
[im 245/401  soft-tissue]
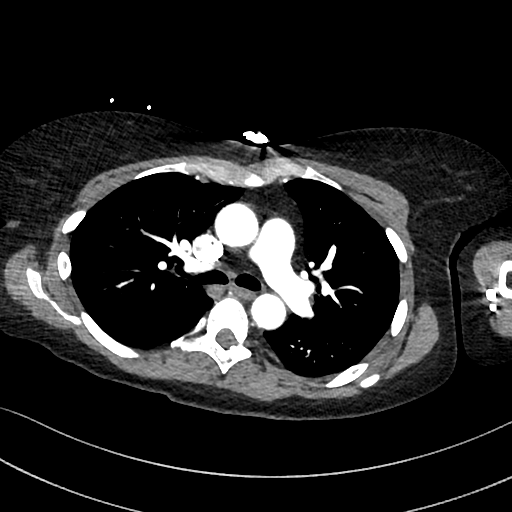
[im 267/401  lung]
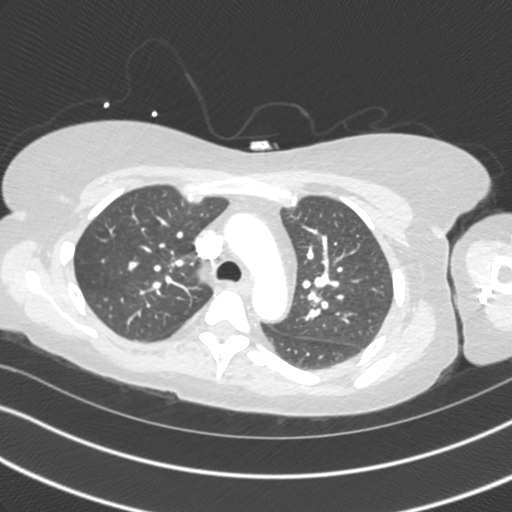
[im 289/401  soft-tissue]
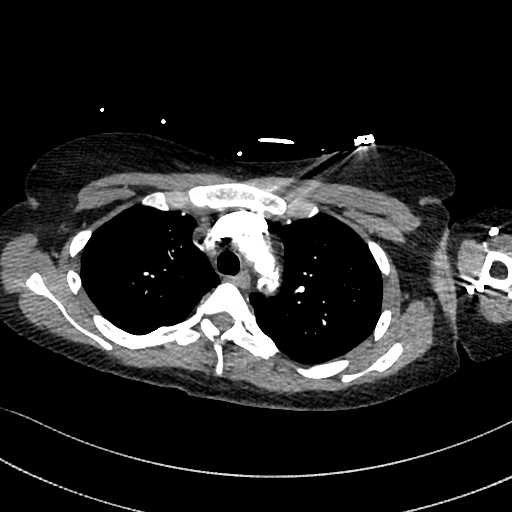
[im 312/401  lung]
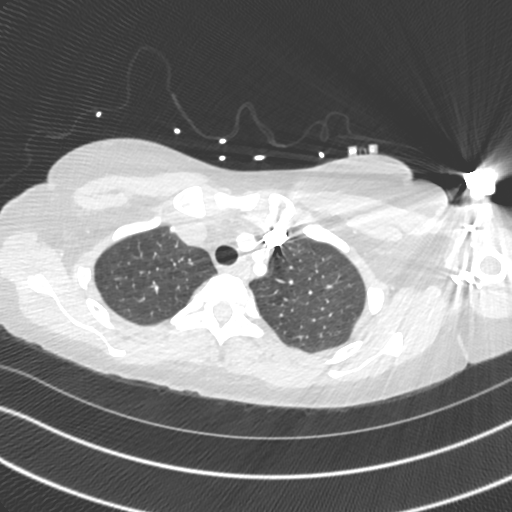
[im 334/401  soft-tissue]
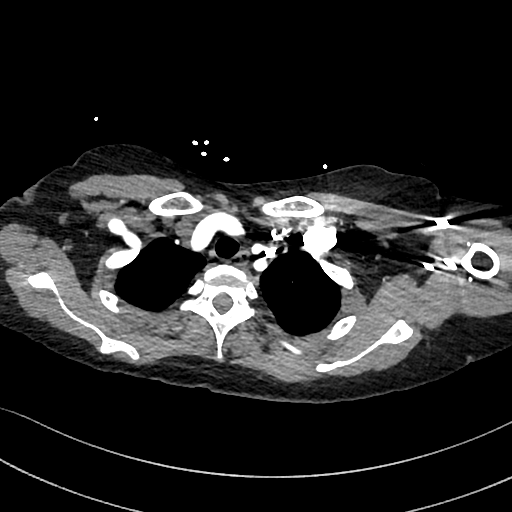
[im 356/401  lung]
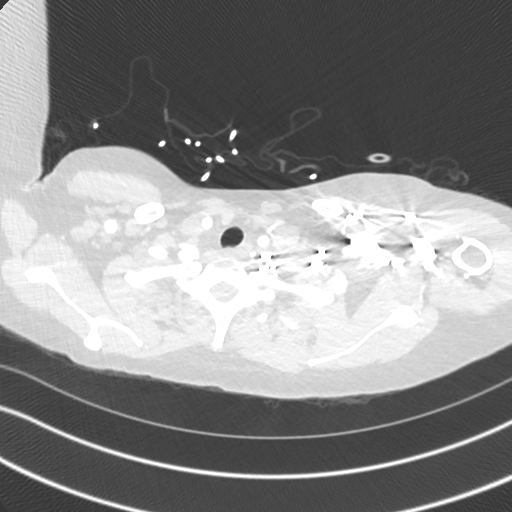
[im 378/401  soft-tissue]
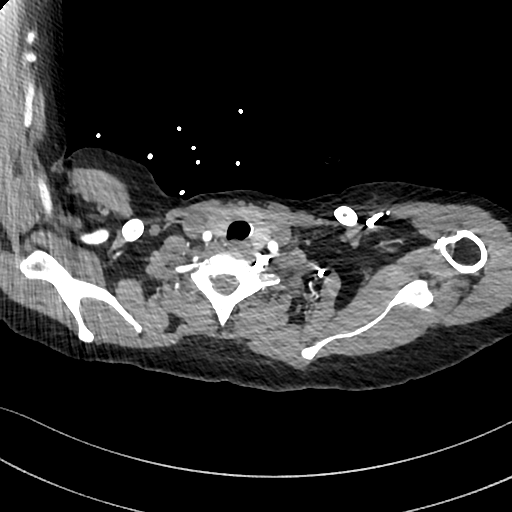

[Series 6: cor · coronal · 0.62mm/px · 3 of 149 slices shown]
[im 38/149  soft-tissue]
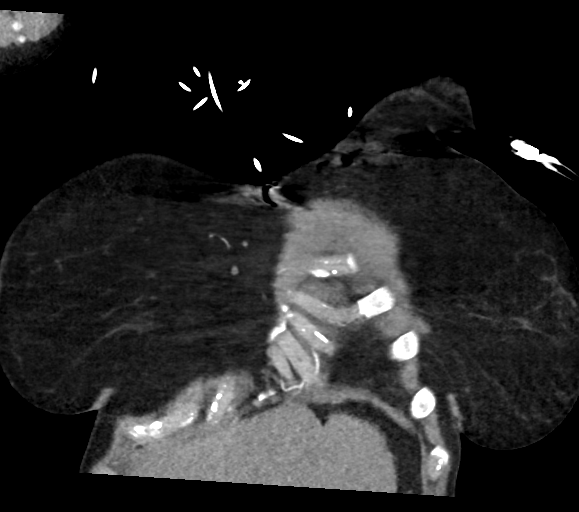
[im 75/149  soft-tissue]
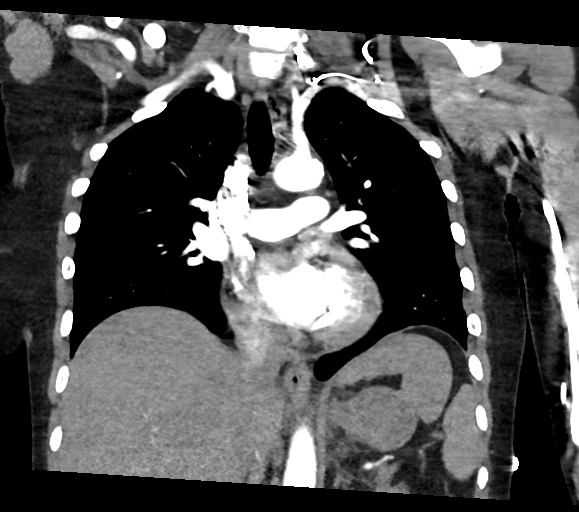
[im 112/149  soft-tissue]
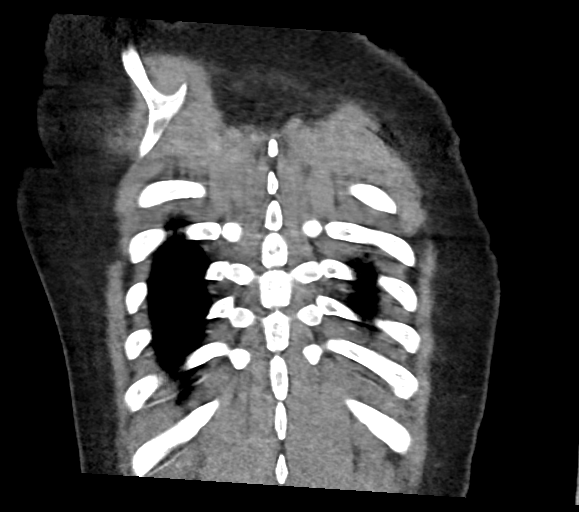

[19 of 46 positions shown; findings below may reference images not displayed]

FINDINGS: Cardiovascular: Satisfactory opacification of the pulmonary arteries
to the segmental level. No evidence of pulmonary embolism. Normal
heart size. No pericardial effusion.

Mediastinum/Nodes: Retrocrural lymphadenopathy, reference abdominal
CT.

Lungs/Pleura: 2 mm peripheral right upper lobe pulmonary nodule,
attention on follow-up. There is no edema, consolidation, effusion,
or pneumothorax.

Upper Abdomen: Reported on dedicated study.

Musculoskeletal: No acute or aggressive finding.

Review of the MIP images confirms the above findings.
IMPRESSION: Negative for pulmonary embolism or other acute finding.

## 2020-05-27 IMAGING — US US BIOPSY LYMPH NODE
1 series · 13 of 25 positions shown · non-contrast
Comparison: CT of the chest, abdomen and pelvis - [DATE]

INDICATION: History of previous renal transplantation (x2), now with bulky
retroperitoneal, pelvic and bilateral inguinal lymphadenopathy as
well as indeterminate liver lesions. Please from ultrasound-guided
left inguinal lymph node biopsy for tissue diagnostic purposes.

EXAM:
ULTRASOUND-GUIDED LEFT INGUINAL LYMPH NODE BIOPSY
TECHNIQUE: Informed written consent was obtained from the patient after a
discussion of the risks, benefits and alternatives to treatment.
Questions regarding the procedure were encouraged and answered.
Initial ultrasound scanning demonstrated an approximately 1.7 x
x 1.6 cm hypoechoic lymph node within the left groin, correlating
with dominant left inguinal lymph node seen on preceding abdominal
CT image 85, series 5. an ultrasound image was saved for
documentation purposes. The procedure was planned. A timeout was
performed prior to the initiation of the procedure.

[Series 1: us core biopsy (lymph nodes) · 13 of 26 slices shown]
[im 1/26]
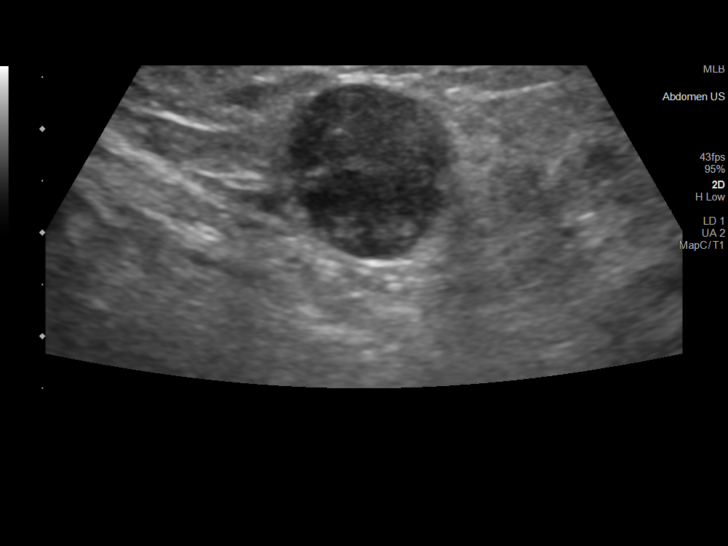
[im 3/26]
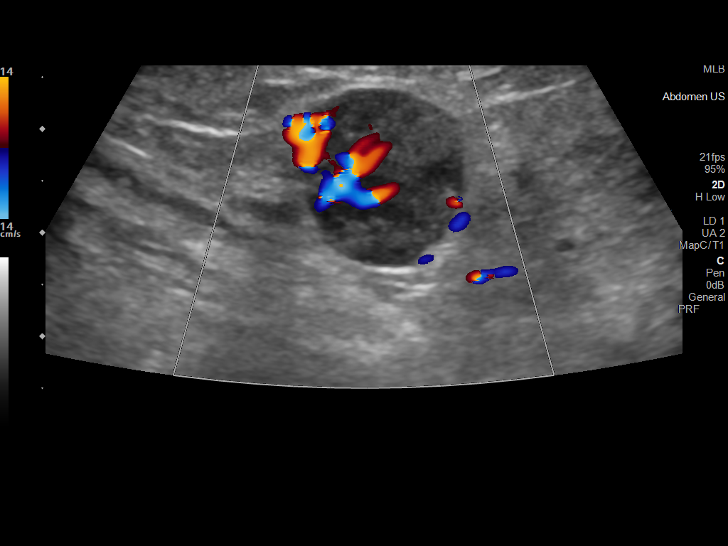
[im 5/26]
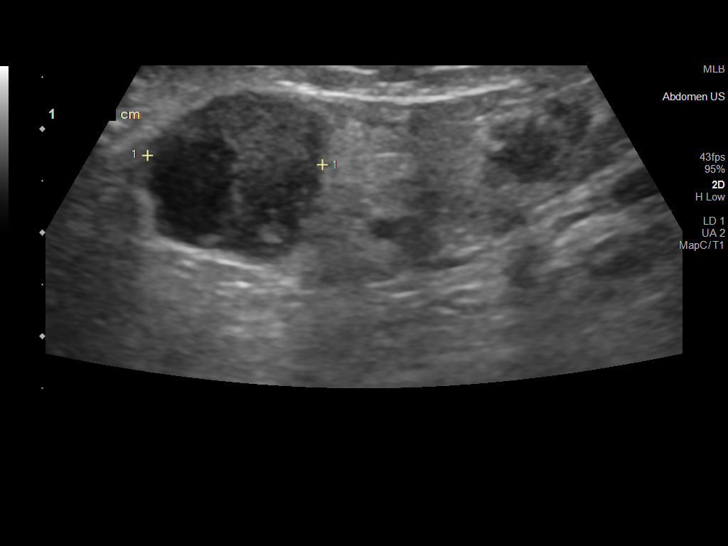
[im 7/26]
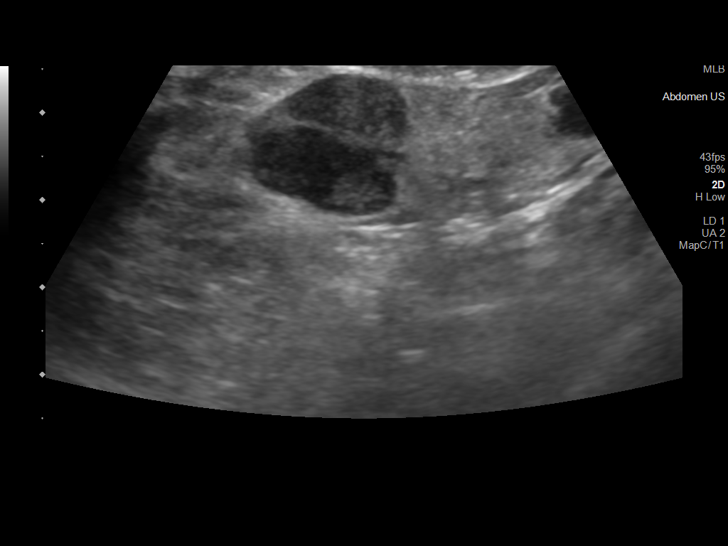
[im 9/26]
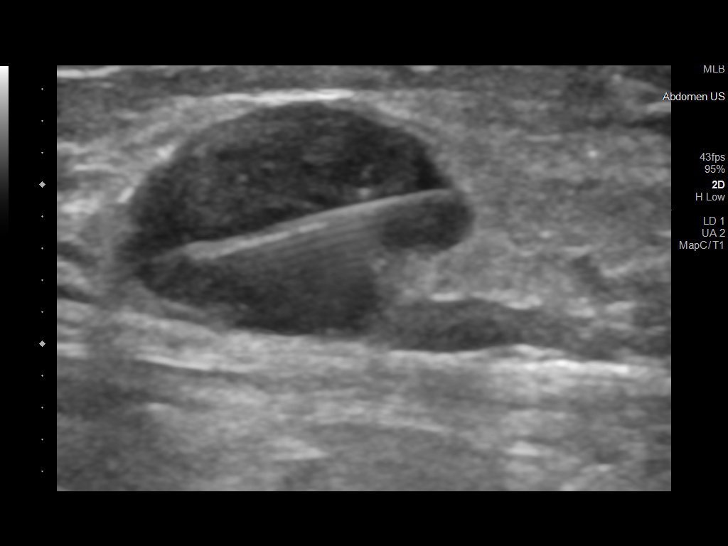
[im 11/26]
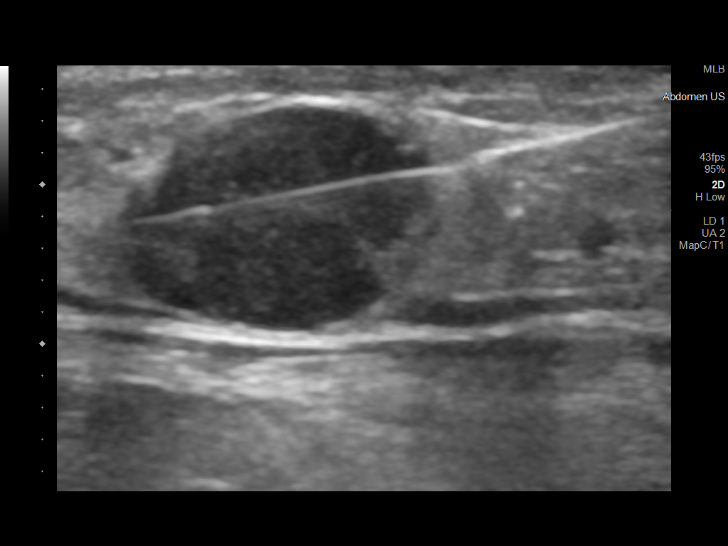
[im 13/26]
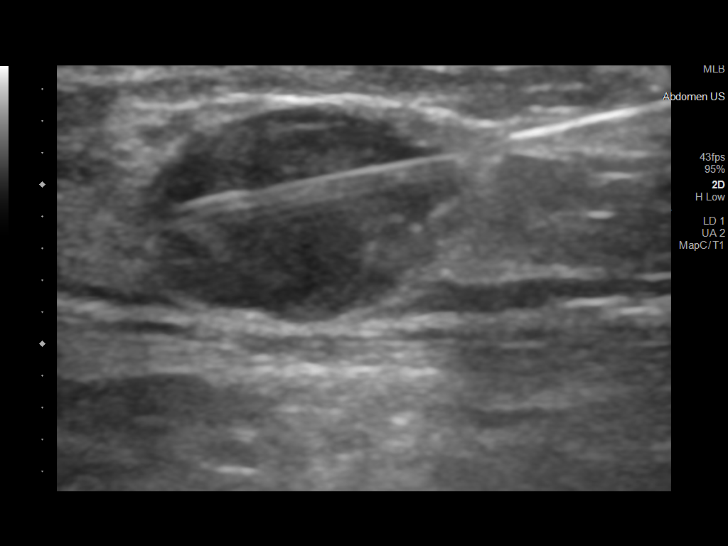
[im 15/26]
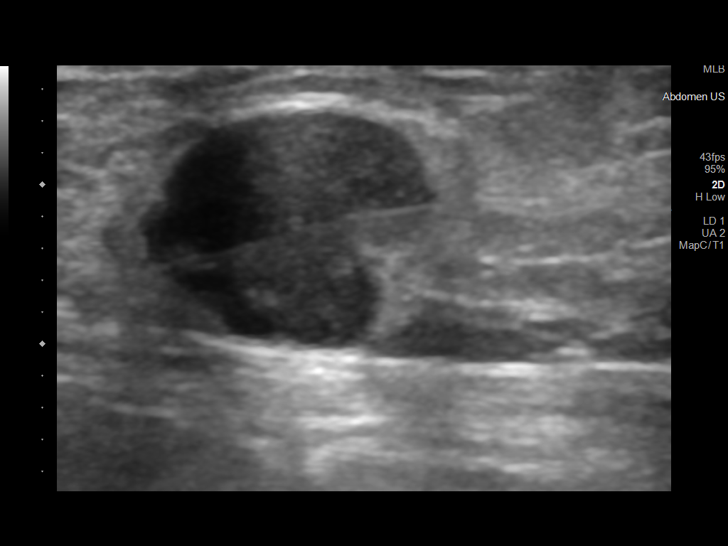
[im 17/26]
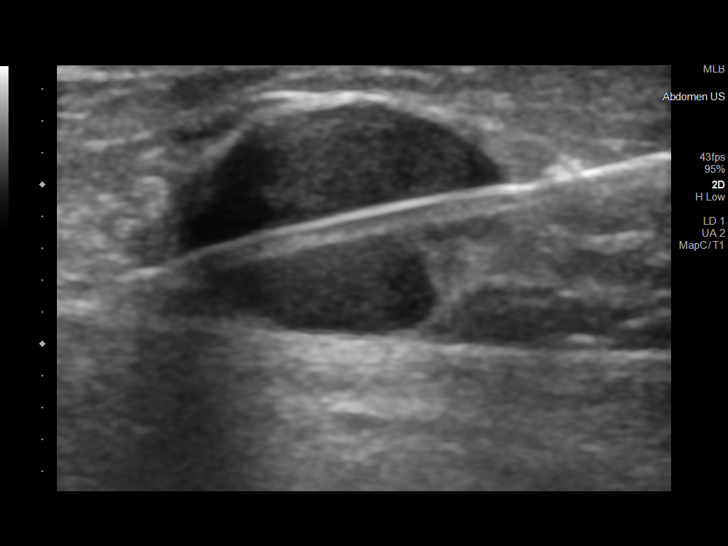
[im 19/26]
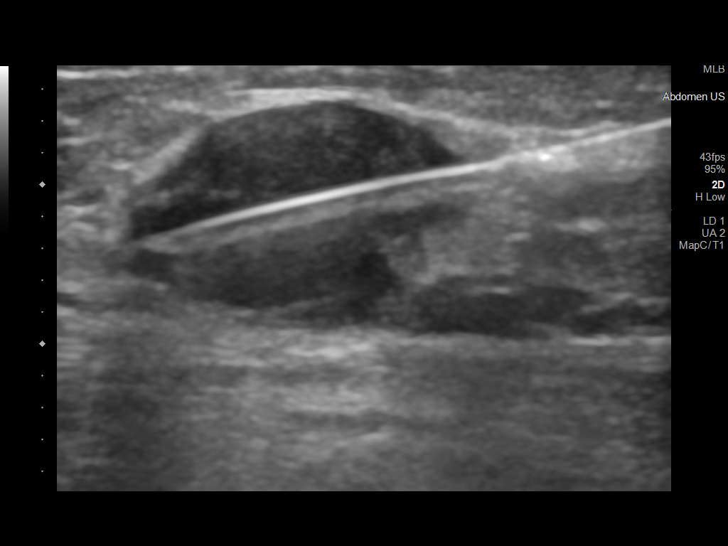
[im 21/26]
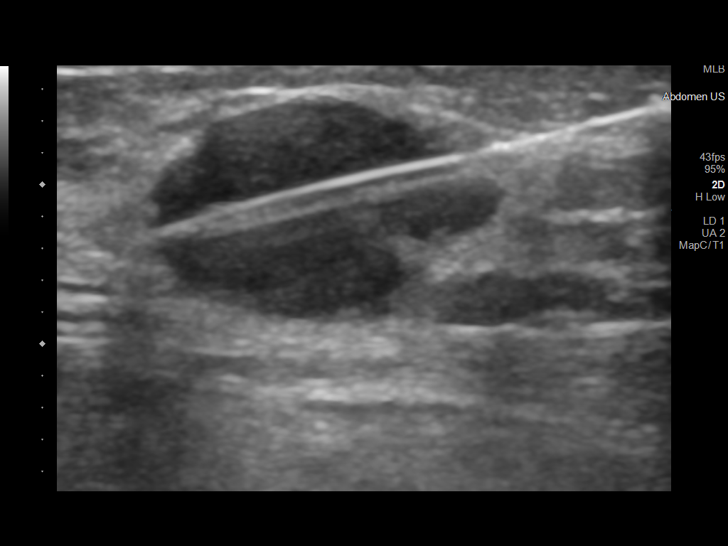
[im 23/26]
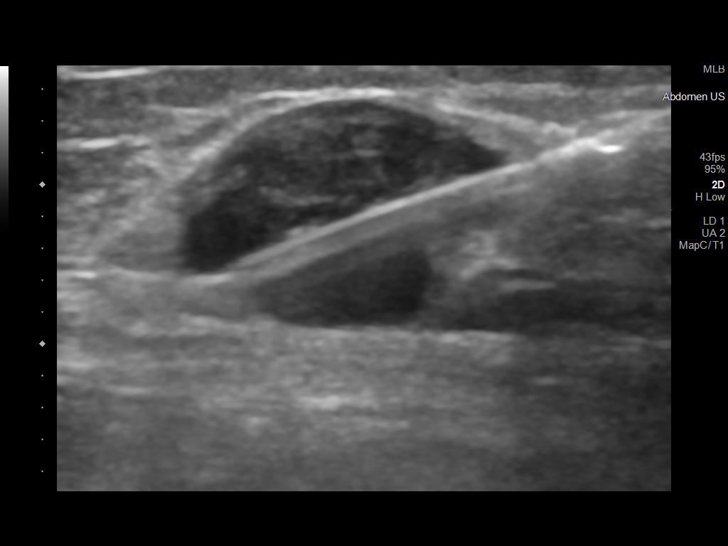
[im 26/26]
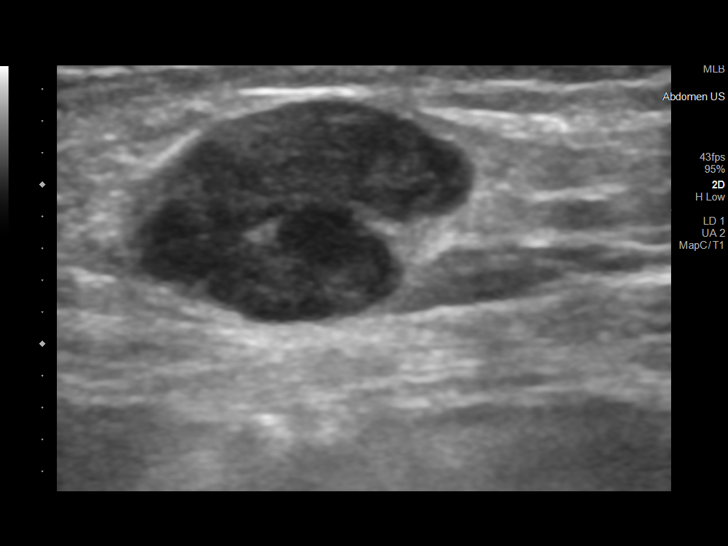

[13 of 25 positions shown; findings below may reference images not displayed]

MEDICATIONS:
None

ANESTHESIA/SEDATION:
Moderate (conscious) sedation was employed during this procedure. A
total of Versed 1 mg and Fentanyl 50 mcg was administered
intravenously.

Moderate Sedation Time: 10 minutes. The patient's level of
consciousness and vital signs were monitored continuously by
radiology nursing throughout the procedure under my direct
supervision.

COMPLICATIONS:
None immediate.
The operative was prepped and draped in the usual sterile fashion,
and a sterile drape was applied covering the operative field. A
timeout was performed prior to the initiation of the procedure.
Local anesthesia was provided with 1% lidocaine with epinephrine.

Under direct ultrasound guidance, an 18 gauge core needle device was
utilized to obtain to obtain 8 core needle biopsies of the dominant
indeterminate left inguinal lymph node.

The samples were placed in saline and submitted to pathology. The
needle was removed and hemostasis was achieved with manual
compression. Post procedure scan was negative for significant
hematoma. A dressing was placed. The patient tolerated the procedure
well without immediate postprocedural complication.
IMPRESSION: Technically successful ultrasound guided biopsy of dominant
indeterminate left inguinal lymph node.

## 2020-05-27 IMAGING — CT CT ABD-PELV W/ CM
2 of 5 series · 16 of 46 positions shown, 18 images · IV contrast (omnipaque)
Comparison: Noncontrast abdominal CT [DATE]

CLINICAL DATA: Abdominal distension.  Adenopathy.

EXAM:
CT ABDOMEN AND PELVIS WITH CONTRAST
TECHNIQUE: Multidetector CT imaging of the abdomen and pelvis was performed
using the standard protocol following bolus administration of
intravenous contrast.
CONTRAST:  75mL OMNIPAQUE IOHEXOL 350 MG/ML SOLN

[Series 5: abdomen 5.0 · axial · 0.78mm/px · z∈[+803,+1253]mm · 13 of 104 slices shown, 15 images]
[im 7/104  soft-tissue]
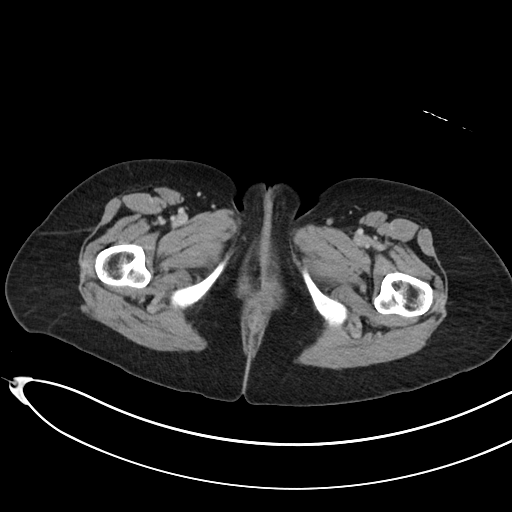
[im 7/104  bone]
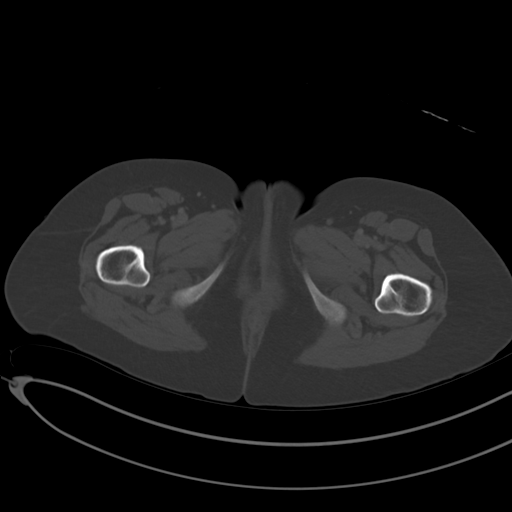
[im 14/104  soft-tissue]
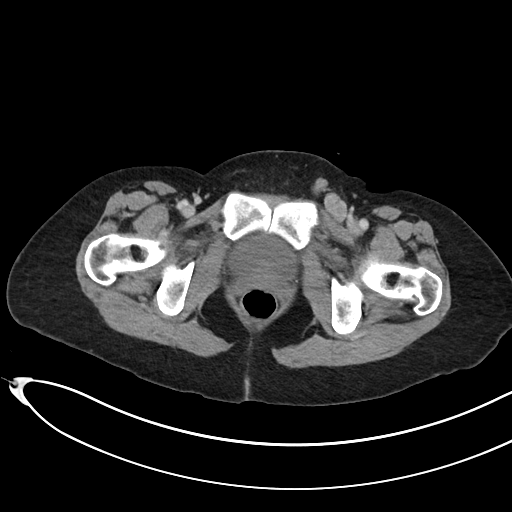
[im 21/104  soft-tissue]
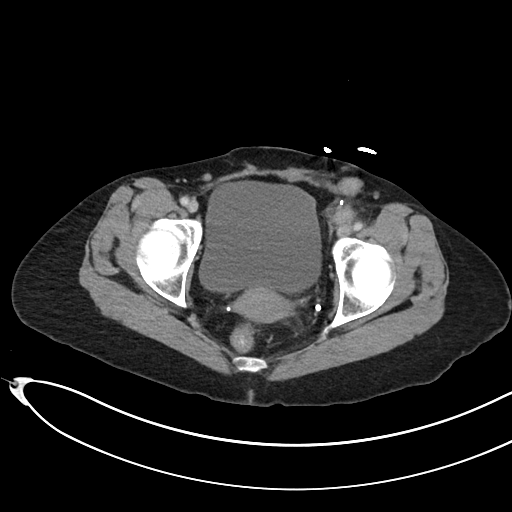
[im 28/104  soft-tissue]
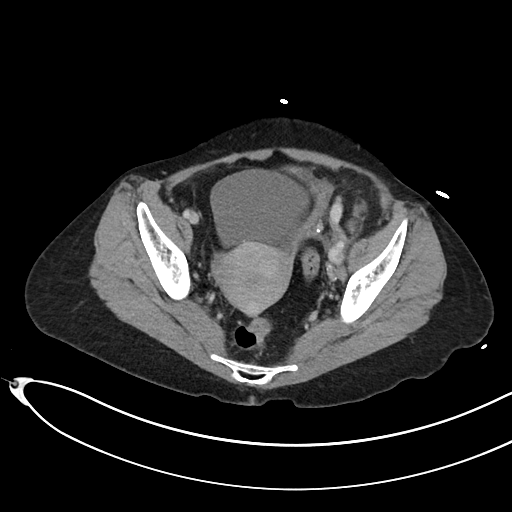
[im 35/104  soft-tissue]
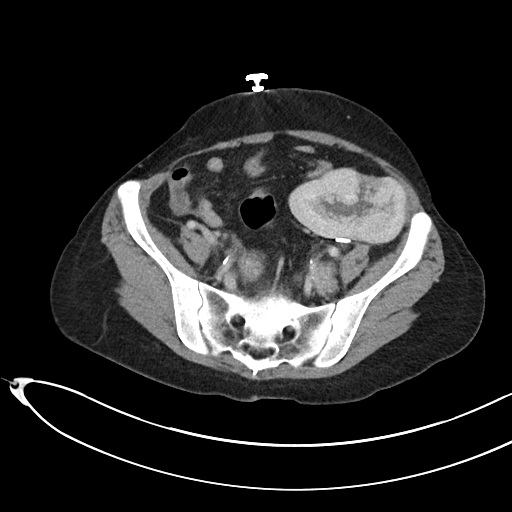
[im 42/104  soft-tissue]
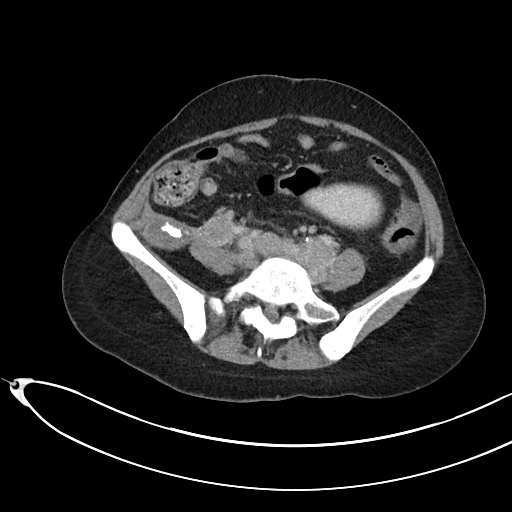
[im 55/104  soft-tissue]
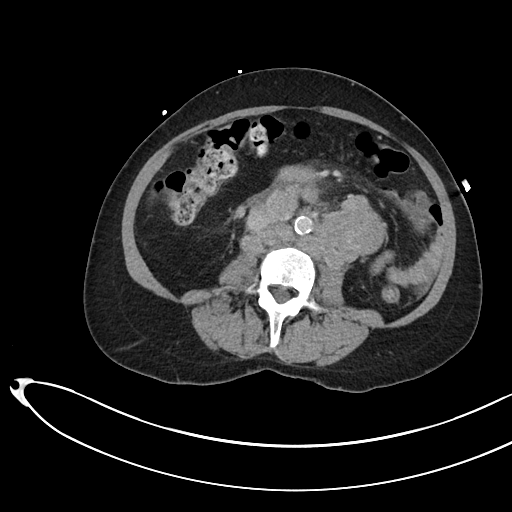
[im 62/104  soft-tissue]
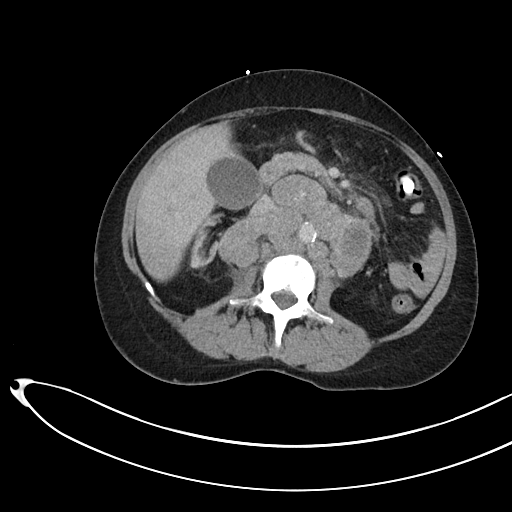
[im 69/104  soft-tissue]
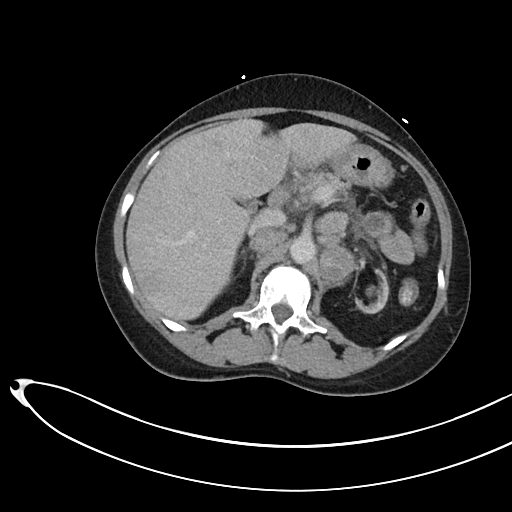
[im 69/104  bone]
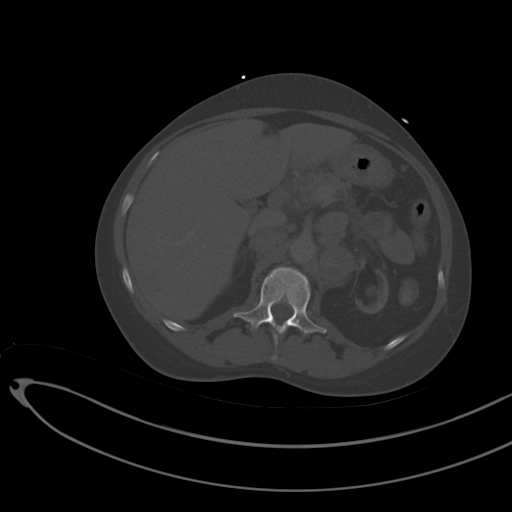
[im 76/104  soft-tissue]
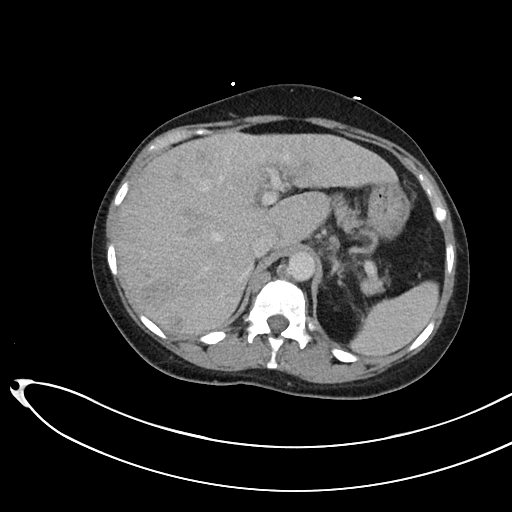
[im 83/104  soft-tissue]
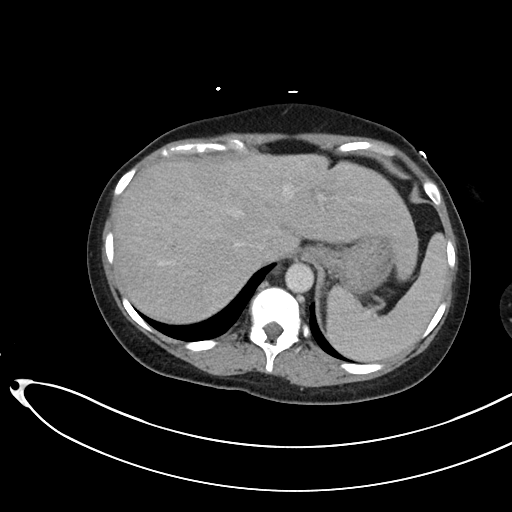
[im 90/104  soft-tissue]
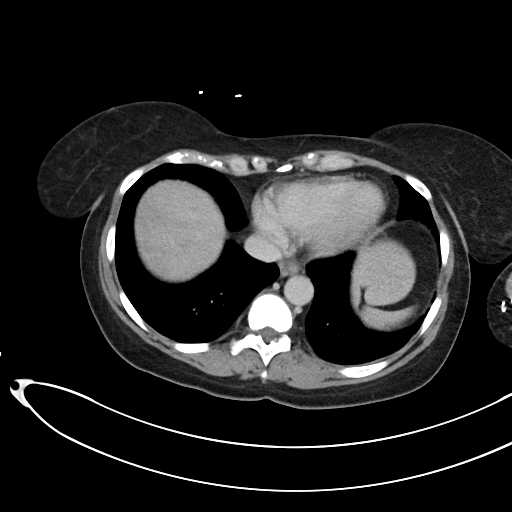
[im 97/104  soft-tissue]
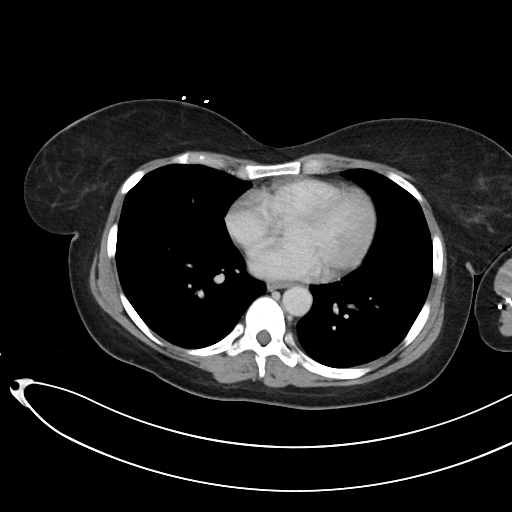

[Series 8: abdomen 3.0 mpr cor · coronal · 0.94mm/px · 3 of 91 slices shown]
[im 31/91  soft-tissue]
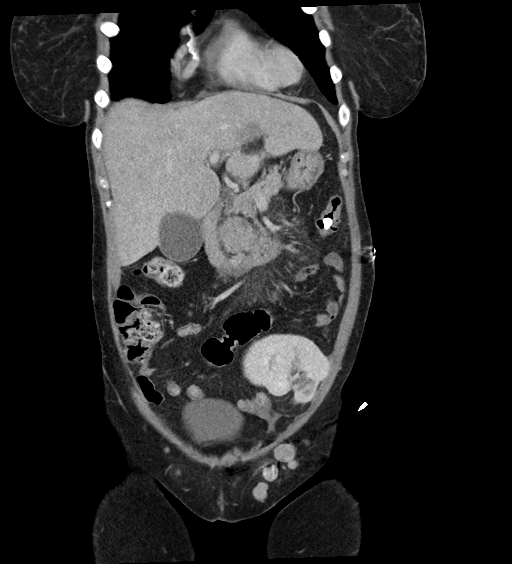
[im 41/91  soft-tissue]
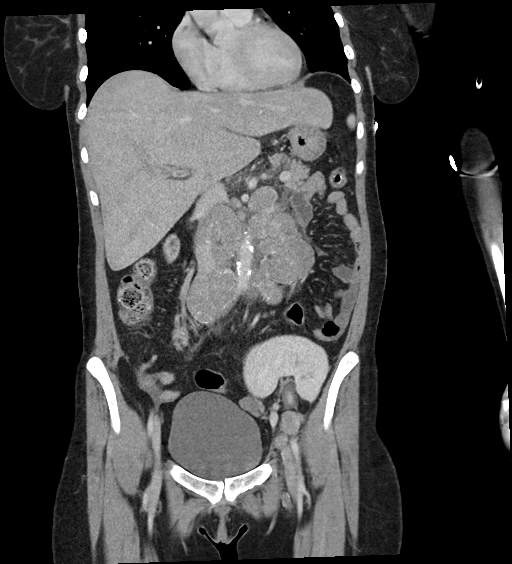
[im 51/91  soft-tissue]
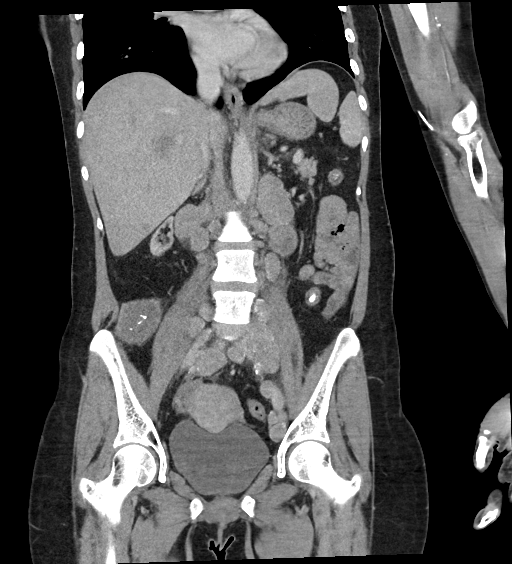

[16 of 46 positions shown; findings below may reference images not displayed]

FINDINGS: Lower chest:  No contributory findings.

Hepatobiliary: Multiple ill-defined masses within the liver. One of
the largest is in the subcapsular inferior right lobe on [DATE] at 2
cm.No evidence of biliary obstruction or stone.

Pancreas: Unremarkable.

Spleen: Unremarkable.

Adrenals/Urinary Tract: Negative adrenals. Native renal atrophy.
Solid masslike appearance at the left renal hilum which measures
cm. There is a transplant kidney in the left lower quadrant with
scarring involving the left-sided pole. There is pelviectasis
without overt hydronephrosis. Failed transplant in the right lower
quadrant. Unremarkable bladder.

Stomach/Bowel: No obstruction. No inflammatory bowel wall
thickening.

Vascular/Lymphatic: Bulky retroperitoneal lymphadenopathy. The nodes
show areas of low-density and calcification, which would be
heterogeneous for untreated lymphoma. For measurement purposes a
node ventral to the aorta and cava on [DATE] measures 42 x 26 mm.
There is also bilateral inguinal lymphadenopathy including a left
inguinal node measuring 16 mm long axis. Adenopathy continues to the
retrocural station. Atherosclerotic calcification, premature for
age.

Reproductive:Intramural fibroids measuring up to 19 mm anteriorly.

Other: No ascites or pneumoperitoneum.

Musculoskeletal: No acute abnormalities.
IMPRESSION: 1. Redemonstrated inguinal and intra-abdominal lymphadenopathy. With
the benefit of contrast, multiple liver lesions are also seen.
Findings could reflect metastatic disease, lymphoma, or
posttransplant lymphoproliferative disease. Left inguinal nodes are
the most amenable to excisional biopsy.
2. 2.2 cm native left lower pole renal mass which may be related
to#1.
3. Left lower quadrant transplant kidney with areas of cortical
scarring. No hydronephrosis or perinephric collection.

## 2020-05-27 MED ORDER — POLYETHYLENE GLYCOL 3350 17 G PO PACK
17.0000 g | PACK | Freq: Two times a day (BID) | ORAL | Status: DC
  Filled 2020-05-27 (×6): qty 1

## 2020-05-27 MED ORDER — SODIUM CHLORIDE 0.9 % IV SOLN
10.0000 mL/h | Freq: Once | INTRAVENOUS | Status: AC
  Administered 2020-05-27: 10 mL/h via INTRAVENOUS

## 2020-05-27 MED ORDER — MORPHINE SULFATE (PF) 4 MG/ML IV SOLN
4.0000 mg | INTRAVENOUS | Status: DC | PRN
  Administered 2020-05-27: 4 mg via INTRAVENOUS
  Filled 2020-05-27: qty 1

## 2020-05-27 MED ORDER — LIDOCAINE-EPINEPHRINE 1 %-1:100000 IJ SOLN
INTRAMUSCULAR | Status: AC
  Administered 2020-05-27: 10 mL
  Filled 2020-05-27: qty 1

## 2020-05-27 MED ORDER — SENNA 8.6 MG PO TABS
1.0000 | ORAL_TABLET | Freq: Every day | ORAL | Status: DC
  Administered 2020-05-27: 8.6 mg via ORAL
  Filled 2020-05-27 (×5): qty 1

## 2020-05-27 MED ORDER — MYCOPHENOLATE MOFETIL 250 MG PO CAPS
250.0000 mg | ORAL_CAPSULE | Freq: Two times a day (BID) | ORAL | Status: DC
  Administered 2020-05-27 – 2020-05-28 (×2): 250 mg via ORAL
  Filled 2020-05-27 (×4): qty 1

## 2020-05-27 MED ORDER — ACETAMINOPHEN 325 MG PO TABS: 650.0000 mg | ORAL_TABLET | Freq: Four times a day (QID) | ORAL | Status: DC | PRN

## 2020-05-27 MED ORDER — MIDAZOLAM HCL 2 MG/2ML IJ SOLN
INTRAMUSCULAR | Status: AC | PRN
  Administered 2020-05-27: 1 mg via INTRAVENOUS

## 2020-05-27 MED ORDER — FENTANYL CITRATE (PF) 100 MCG/2ML IJ SOLN
INTRAMUSCULAR | Status: AC
Start: 2020-05-27 — End: 2020-05-28
  Filled 2020-05-27: qty 2

## 2020-05-27 MED ORDER — HYDROMORPHONE HCL 1 MG/ML IJ SOLN
0.1500 mg | INTRAMUSCULAR | Status: DC | PRN
  Administered 2020-05-28 – 2020-05-29 (×4): 0.15 mg via INTRAVENOUS
  Filled 2020-05-27 (×4): qty 1

## 2020-05-27 MED ORDER — PHENOL 1.4 % MT LIQD
1.0000 | OROMUCOSAL | Status: DC | PRN
  Administered 2020-05-27: 1 via OROMUCOSAL
  Filled 2020-05-27 (×2): qty 177

## 2020-05-27 MED ORDER — FLUCONAZOLE 100 MG PO TABS
100.0000 mg | ORAL_TABLET | Freq: Every day | ORAL | Status: DC
  Administered 2020-05-27 – 2020-06-01 (×6): 100 mg via ORAL
  Filled 2020-05-27 (×6): qty 1

## 2020-05-27 MED ORDER — ENOXAPARIN SODIUM 40 MG/0.4ML ~~LOC~~ SOLN
40.0000 mg | SUBCUTANEOUS | Status: DC
  Filled 2020-05-27 (×2): qty 0.4

## 2020-05-27 MED ORDER — MIDAZOLAM HCL 2 MG/2ML IJ SOLN
INTRAMUSCULAR | Status: AC
  Filled 2020-05-27: qty 2

## 2020-05-27 MED ORDER — ONDANSETRON HCL 4 MG/2ML IJ SOLN
4.0000 mg | Freq: Three times a day (TID) | INTRAMUSCULAR | Status: DC | PRN
  Administered 2020-05-27 – 2020-05-31 (×6): 4 mg via INTRAVENOUS
  Filled 2020-05-27 (×7): qty 2

## 2020-05-27 MED ORDER — ONDANSETRON 4 MG PO TBDP
8.0000 mg | ORAL_TABLET | Freq: Three times a day (TID) | ORAL | Status: DC | PRN
  Administered 2020-05-27 – 2020-06-01 (×3): 8 mg via ORAL
  Filled 2020-05-27 (×4): qty 2

## 2020-05-27 MED ORDER — GLYCERIN (LAXATIVE) 2.1 G RE SUPP
1.0000 | Freq: Every day | RECTAL | Status: DC | PRN
  Filled 2020-05-27: qty 1

## 2020-05-27 MED ORDER — LIDOCAINE HCL (PF) 1 % IJ SOLN
INTRAMUSCULAR | Status: AC
  Filled 2020-05-27: qty 30

## 2020-05-27 MED ORDER — FENTANYL CITRATE (PF) 100 MCG/2ML IJ SOLN
INTRAMUSCULAR | Status: AC | PRN
  Administered 2020-05-27: 50 ug via INTRAVENOUS

## 2020-05-27 MED ORDER — ENOXAPARIN SODIUM 40 MG/0.4ML ~~LOC~~ SOLN
40.0000 mg | SUBCUTANEOUS | Status: DC
  Filled 2020-05-27: qty 0.4

## 2020-05-27 MED ORDER — IOHEXOL 350 MG/ML SOLN
75.0000 mL | Freq: Once | INTRAVENOUS | Status: AC | PRN
  Administered 2020-05-27: 75 mL via INTRAVENOUS

## 2020-05-27 MED ORDER — PREDNISONE 5 MG PO TABS
5.0000 mg | ORAL_TABLET | Freq: Every day | ORAL | Status: DC
  Administered 2020-05-28 – 2020-06-01 (×5): 5 mg via ORAL
  Filled 2020-05-27 (×7): qty 1

## 2020-05-27 MED ORDER — TACROLIMUS ER 1 MG PO TB24: 4.0000 mg | ORAL_TABLET | Freq: Every day | ORAL | Status: DC

## 2020-05-27 MED ORDER — PANTOPRAZOLE SODIUM 40 MG PO TBEC
40.0000 mg | DELAYED_RELEASE_TABLET | Freq: Every day | ORAL | Status: DC
  Administered 2020-05-27 – 2020-06-01 (×6): 40 mg via ORAL
  Filled 2020-05-27 (×6): qty 1

## 2020-05-27 MED ORDER — SORBITOL 70 % SOLN
960.0000 mL | TOPICAL_OIL | Freq: Once | ORAL | Status: DC
  Filled 2020-05-27: qty 473

## 2020-05-27 NOTE — H&P (Signed)
[facility] Admission History and Physical Service Pager: [phone]  Patient name: Candace Griffin Medical record number: [id number] Date of birth: 1975-09-08 Age: 44 y.o. Gender: female  Primary Care Provider: Richarda Osmond, DO Consultants: IR, oncology Code Status: full. which was confirmed with family if patient unable to confirm   Preferred Emergency Contact: Kandis Ban (mother) [phone]  Chief Complaint: SOB, CP  Assessment and Plan: Candace Griffin is a 44 y.o. female presenting with CP, SOB. PMH is significant for s/p renal transplant, anemia, GERD, HTN, menorrhagia   Symptomatic anemia- chronic and stable at latest PCP appointments. Thought to be due to menorrhagia. Pelvic US 12/2019 showed uterine fibroids but no other acute abnormalities. On presentation today, hgb 4.0 (hgb 8.6 at prior ED presentation 12 days ago). Normal platelets and WBCs. Started one uPRBCs in ED and another unit being shipped from [place] due to local shortage of patient's blood requirements. Tachycardia and CP improved with IV fluids and transfusion. Patient not able to ambulate to bathroom without SOB still. Neg for PE.  - admit to med-tele, attending Dr. Ardelia Mems - post h/h - CBC with diff now and CBC am - continuous telemetry - heme/onc consulted, appreciate recs  Lymphadenopathy- seen on abd/pelvis CT 9/23 and today. Concerning for posttransplant lymphoproliferative disease vs. Lymphoma, vs. Metastatic disease. Also has multiple liver lesions and renal mass. CMET shows elevation of liver enzymes from 9/23. AST 336, ALT, 105, alk phos 242. At office appointment 8/4, patient was 76.8kg and is 64.9kg at presentation today. - spoke with IR who are planning to biopsy lymph node. - heme/onc consulted, appreciate recs - patient NPO  S/p renal transplant- follows up regularly with nephrology at [facility] and has normal recent labs and check up. Home meds include  mycophenolate and tacrolimus, prednisone. Urinalysis neg other than ketones, electrolytes and creatinine are stable at baseline. Cr 1.13 on admission. - continue suppressants  - RFP am - monitor kidney function closely in setting of anemia, dehydration, and starting potentially nephrotoxic medications. If worsening function, consult nephrology.   N/v- intractable. Causing dehydration. Could be related to thrush or the source of lymphadenopathy. - ODT zofran if can tolerate.  - NPO while awaiting biopsy  Constipation- chronic and worsening for the past 5 months. Home remedies by PCP have not helped. Patient endorses about a month since last full BM. Only a few pellets when straining very hard. Abdominal distension, diffuse tenderness and decreased/absent bowel sounds on exam. No obstruction seen on abdominal CT today. - enema - PO laxitives - suppositories PRN  Thrush- immunocompromised patient with throat pain x2 weeks and white plaque on tongue and pharynx. H/o thrush. - fluconazole 127m daily. likely 2 week tx. No renal dose adjustment required - topical pain relief to avoid use of NSAIDs and tylenol - if not improving in a week or so, recommend EGD with biopsy  FEN/GI: NPO Prophylaxis: lovenox  Disposition: f/u with heme/onc  History of Present Illness:  Candace PAIZis a 44y.o. female presenting with CP, SOB acutely worsening over past day. Patient has felt "unwell" for several months. Fatigue, N/V, constipation were here main concerns. She is now getting shortness of breath even when walking to the bathroom which is what prompted her ED presentation. She had appointment scheduled for today with PCP to follow up on CT findings from ED 9/23. She endorses fear of the worst explanation.  Additionally:  -she has not had a regular BM in about  a month. Only a small number of stool pellets with straining throughout the month. She feels abdominal pain and distention. Home remedies of  senna and miralax have not improved.  - has throat pain that started about 2 weeks ago and she attributed this to vomiting. No fevers or hematemesis. Has h/o thrush.  Review Of Systems: Per HPI with the following additions:  Review of Systems  Constitutional: Positive for activity change, appetite change, fatigue and unexpected weight change. Negative for fever.  HENT: Positive for sore throat and trouble swallowing.   Respiratory: Positive for shortness of breath. Negative for cough.   Cardiovascular: Positive for chest pain. Negative for leg swelling.  Gastrointestinal: Positive for abdominal distention, abdominal pain, constipation, nausea and vomiting.  Genitourinary: Negative for dysuria and hematuria.     Patient Active Problem List   Diagnosis Date Noted  . Feeling unwell 03/28/2020  . Constipation 02/21/2020  . Left arm pain 11/16/2019  . Symptomatic anemia 12/03/2017  . Low grade squamous intraepithelial lesion (LGSIL) on cervical Pap smear 05/03/2014  . Genital warts 07/31/2013  . Hypertension 04/21/2012  . Status post kidney transplant 01/07/2012  . Menorrhagia 2011-10-20  . GERD 10/10/2009  . GENITAL HERPES, HX OF 08/11/2007   Past Medical History: Past Medical History:  Diagnosis Date  . Chronic glomerulonephritis   . Dialysis patient ([place])   . End stage renal disease ([place])   . GERD (gastroesophageal reflux disease)   . History of renal transplant Oct 20, 2011  . Hypertension    Past Surgical History: Past Surgical History:  Procedure Laterality Date  . COLPOSCOPY  2016  . diaylsis shunt Right    arm  . KIDNEY TRANSPLANT  10/20/2011   deceased donor kidney   Social History: Social History   Tobacco Use  . Smoking status: Never Smoker  . Smokeless tobacco: Never Used  Substance Use Topics  . Alcohol use: No  . Drug use: No   Additional social history: Please also refer to relevant sections of EMR.  Family History: Family History  Problem Relation Age  of Onset  . Diabetes Father   . Diabetes Paternal Aunt   . Diabetes Paternal Grandmother   . Alcohol abuse Paternal Grandmother   . Alcohol abuse Paternal Uncle   . Cancer Maternal Grandmother        breast  . Breast cancer Maternal Grandmother   . Alcohol abuse Paternal Grandfather    Allergies and Medications: Allergies  Allergen Reactions  . Lisinopril Cough  . Other Rash    Causes welts also  . Tape Rash and Other (See Comments)    Causes welts also   No current facility-administered medications on file prior to encounter.   Current Outpatient Medications on File Prior to Encounter  Medication Sig Dispense Refill  . cholecalciferol (VITAMIN D3) 25 MCG (1000 UNIT) tablet Take 1,000 Units by mouth daily.    . mycophenolate (CELLCEPT) 250 MG capsule Take 250 mg by mouth 2 (two) times daily.     Marland Kitchen omeprazole (PRILOSEC) 20 MG capsule Take 20 mg by mouth 2 (two) times daily as needed (for heart burn).     . ondansetron (ZOFRAN) 4 MG tablet Take 1 tablet (4 mg total) by mouth every 6 (six) hours as needed for nausea or vomiting. 30 tablet 0  . pravastatin (PRAVACHOL) 20 MG tablet Take 20 mg by mouth at bedtime.     . predniSONE (DELTASONE) 5 MG tablet Take 5 mg by mouth daily with breakfast.     .  senna (SENOKOT) 8.6 MG TABS tablet Take 1 tablet (8.6 mg total) by mouth daily as needed for moderate constipation. 90 tablet 0  . Tacrolimus ER (ENVARSUS XR) 1 MG TB24 Take 4 mg by mouth daily.     . traMADol (ULTRAM) 50 MG tablet Take 1 tablet (50 mg total) by mouth every 6 (six) hours as needed. (Patient taking differently: Take 50 mg by mouth every 6 (six) hours as needed for moderate pain. ) 15 tablet 0   Objective: BP 127/90   Pulse 100   Temp 98 F (36.7 C)   Resp (!) 22   Ht '5\' 7"'  (1.702 m)   Wt 64.9 kg   LMP 05/05/2020   SpO2 100%   BMI 22.40 kg/m  Exam: General: tired, but non-toxic Eyes: PERRL, scleral icterus ENTM: cervical lymphadenitis, white plaque on tongue and  tonsils Neck: ROM intact, tenderness with palpation Cardiovascular: S1,S2 present, no murmur appreciated, rapid rate, regular Respiratory: CTAB Gastrointestinal: distended abdomen, decreased/absent BS, tender diffusely, healed surgical scars MSK: normal development R forearm with changes from dialysis access-non-infectious Derm: no rashes. Neuro: alert and oriented Psych: calm and reasonable  Labs and Imaging: CBC BMET  Recent Labs  Lab 05/27/20 0113  WBC 7.6  HGB 4.0*  HCT 14.0*  PLT 376   Recent Labs  Lab 05/27/20 0113  NA 137  K 4.4  CL 107  CO2 17*  BUN 17  CREATININE 1.13*  GLUCOSE 96  CALCIUM 8.0*     EKG: My own interpretation (not copied from electronic read) sinus tachycardia   CT Angio Chest PE W and/or Wo Contrast  Result Date: 05/27/2020 CLINICAL DATA:  PE suspected, high probability EXAM: CT ANGIOGRAPHY CHEST WITH CONTRAST TECHNIQUE: Multidetector CT imaging of the chest was performed using the standard protocol during bolus administration of intravenous contrast. Multiplanar CT image reconstructions and MIPs were obtained to evaluate the vascular anatomy. CONTRAST:  27m OMNIPAQUE IOHEXOL 350 MG/ML SOLN COMPARISON:  None. FINDINGS: Cardiovascular: Satisfactory opacification of the pulmonary arteries to the segmental level. No evidence of pulmonary embolism. Normal heart size. No pericardial effusion. Mediastinum/Nodes: Retrocrural lymphadenopathy, reference abdominal CT. Lungs/Pleura: 2 mm peripheral right upper lobe pulmonary nodule, attention on follow-up. There is no edema, consolidation, effusion, or pneumothorax. Upper Abdomen: Reported on dedicated study. Musculoskeletal: No acute or aggressive finding. Review of the MIP images confirms the above findings. IMPRESSION: Negative for pulmonary embolism or other acute finding. Electronically Signed   By: JMonte FantasiaM.D.   On: 05/27/2020 06:02   CT ABDOMEN PELVIS W CONTRAST  Result Date: 05/27/2020 CLINICAL  DATA:  Abdominal distension.  Adenopathy. EXAM: CT ABDOMEN AND PELVIS WITH CONTRAST TECHNIQUE: Multidetector CT imaging of the abdomen and pelvis was performed using the standard protocol following bolus administration of intravenous contrast. CONTRAST:  794mOMNIPAQUE IOHEXOL 350 MG/ML SOLN COMPARISON:  Noncontrast abdominal CT 05/16/2020 FINDINGS: Lower chest:  No contributory findings. Hepatobiliary: Multiple ill-defined masses within the liver. One of the largest is in the subcapsular inferior right lobe on 5:33 at 2 cm.No evidence of biliary obstruction or stone. Pancreas: Unremarkable. Spleen: Unremarkable. Adrenals/Urinary Tract: Negative adrenals. Native renal atrophy. Solid masslike appearance at the left renal hilum which measures 2.2 cm. There is a transplant kidney in the left lower quadrant with scarring involving the left-sided pole. There is pelviectasis without overt hydronephrosis. Failed transplant in the right lower quadrant. Unremarkable bladder. Stomach/Bowel: No obstruction. No inflammatory bowel wall thickening. Vascular/Lymphatic: Bulky retroperitoneal lymphadenopathy. The nodes show areas of  low-density and calcification, which would be heterogeneous for untreated lymphoma. For measurement purposes a node ventral to the aorta and cava on 5:44 measures 42 x 26 mm. There is also bilateral inguinal lymphadenopathy including a left inguinal node measuring 16 mm long axis. Adenopathy continues to the retrocural station. Atherosclerotic calcification, premature for age. Reproductive:Intramural fibroids measuring up to 19 mm anteriorly. Other: No ascites or pneumoperitoneum. Musculoskeletal: No acute abnormalities. IMPRESSION: 1. Redemonstrated inguinal and intra-abdominal lymphadenopathy. With the benefit of contrast, multiple liver lesions are also seen. Findings could reflect metastatic disease, lymphoma, or posttransplant lymphoproliferative disease. Left inguinal nodes are the most amenable to  excisional biopsy. 2. 2.2 cm native left lower pole renal mass which may be related to#1. 3. Left lower quadrant transplant kidney with areas of cortical scarring. No hydronephrosis or perinephric collection. Electronically Signed   By: Monte Fantasia M.D.   On: 05/27/2020 06:16   DG Chest Port 1 View  Result Date: 05/26/2020 CLINICAL DATA:  Chest pain and vomiting EXAM: PORTABLE CHEST 1 VIEW COMPARISON:  Chest x-ray 02/03/2015. FINDINGS: The heart size and mediastinal contours are within normal limits. No focal consolidation. No pulmonary edema. No pleural effusion. No pneumothorax. No acute osseous abnormality. IMPRESSION: No active disease. Electronically Signed   By: Iven Finn M.D.   On: 05/26/2020 23:40     Candace Osmond, DO 05/27/2020, 9:16 AM PGY-3, [place] [facility] pager: [phone], text pages welcome

## 2020-05-27 NOTE — ED Notes (Signed)
Pt transported to IR 

## 2020-05-27 NOTE — ED Notes (Signed)
Pt back from IR 

## 2020-05-27 NOTE — Consult Note (Addendum)
[place]  Telephone:([phone] [phone]   [place]  Referral MD: Dr. Chrisandra Netters  Reason for Referral: Abnormal CT scan, anemia  HPI: Candace Griffin is a 44 year old female with a past medical history significant for chronic glomerulonephritis status post renal transplant in July 2019 and currently taking mycophenolate, tacrolimus, and prednisone, anemia, GERD, hypertension, menorrhagia.  The patient presented to the emergency room with chest pain or shortness of breath for several days.  In the emergency room, her hemoglobin was 4.0 (previously 8.6 on 05/15/2020) and chemistry showed a creatinine of 1.13, calcium 8.0, albumin 2.0, AST 336, ALT 105, alkaline phosphatase 242, and T bili 1.5.  2 units of PRBCs have been ordered.  She had a CTA of the chest which was negative for PE or any other acute finding.  CT of the abdomen and pelvis showed redemonstrated inguinal and intra-abdominal lymphadenopathy, multiple liver lesions-findings concerning for metastatic disease, lymphoma, or post transplant lymphoproliferative disease, 2.2 cm native left lower pole renal mass, left lower quadrant transplant kidney with areas of cortical scarring.  Of note, the retroperitoneal, pelvic, and inguinal lymphadenopathy was present on a CT scan performed on 05/16/2020.  She was referred back to her primary care provider for further work-up of the lymphadenopathy.  She states that she was scheduled to see her primary care provider today to follow-up on these results but ended up coming to the emergency room instead.  She has been seen by interventional radiology for for consideration of CT-guided biopsy of one of her inguinal lymph nodes and underwent CT-guided biopsy of the left inguinal lymph node just prior to my visit.  The patient is somewhat sleepy at the time of visit since she just returned from IR.  She is able to awaken and answer questions.  She  states that she has had fatigue and generalized weakness as well as a weight loss of about 20 pounds since the end of August 2021.  She has not been able to eat or drink much of anything secondary to nausea and vomiting.  She reports fevers, but no chills or night sweats.  She is able to palpate the lymph nodes in her inguinal area.  She denies headaches and dizziness.  She has been experiencing chest pain or shortness of breath.  Denies abdominal pain.  She reports significant constipation over the past month.  Denies epistaxis, hemoptysis, hematemesis, hematuria, melena, hematochezia.  Denies lower extremity edema.  Medical oncology was asked to see the patient for recommendations regarding her abnormal CT scan findings and anemia.   Past Medical History:  Diagnosis Date  . Chronic glomerulonephritis   . Dialysis patient ([place])   . End stage renal disease ([place])   . GERD (gastroesophageal reflux disease)   . History of renal transplant October 17, 2011  . Hypertension   :  Past Surgical History:  Procedure Laterality Date  . COLPOSCOPY  2016  . diaylsis shunt Right    arm  . KIDNEY TRANSPLANT  10-17-11   deceased donor kidney  :  Current Facility-Administered Medications  Medication Dose Route Frequency Provider Last Rate Last Admin  . 0.9 %  sodium chloride infusion  1,000 mL Intravenous Continuous Anderson, Chelsey L, DO 125 mL/hr at 05/27/20 0844 1,000 mL at 05/27/20 0844  . enoxaparin (LOVENOX) injection 40 mg  40 mg Subcutaneous Q24H Anderson, Chelsey L, DO      . fluconazole (DIFLUCAN) tablet 100 mg  100 mg Oral Daily Doristine Mango  L, DO      . Glycerin (Adult) 2.1 g suppository 1 suppository  1 suppository Rectal Daily PRN Ouida Sills, Chelsey L, DO      . mycophenolate (CELLCEPT) capsule 250 mg  250 mg Oral BID Anderson, Chelsey L, DO      . ondansetron (ZOFRAN) injection 4 mg  4 mg Intravenous Q8H PRN Anderson, Chelsey L, DO      . ondansetron (ZOFRAN-ODT) disintegrating tablet 8 mg  8  mg Oral Q8H PRN Anderson, Chelsey L, DO   8 mg at 05/27/20 0841  . phenol (CHLORASEPTIC) mouth spray 1 spray  1 spray Mouth/Throat PRN Anderson, Chelsey L, DO      . polyethylene glycol (MIRALAX / GLYCOLAX) packet 17 g  17 g Oral BID Anderson, Chelsey L, DO      . [START ON 05/28/2020] predniSONE (DELTASONE) tablet 5 mg  5 mg Oral Q breakfast Anderson, Chelsey L, DO      . senna (SENOKOT) tablet 8.6 mg  1 tablet Oral Daily Anderson, Chelsey L, DO      . sorbitol, milk of mag, mineral oil, glycerin (SMOG) enema  960 mL Rectal Once Anderson, Chelsey L, DO      . Tacrolimus ER TB24 4 mg  4 mg Oral Daily Anderson, Chelsey L, DO       Current Outpatient Medications  Medication Sig Dispense Refill  . cholecalciferol (VITAMIN D3) 25 MCG (1000 UNIT) tablet Take 1,000 Units by mouth daily.    . mycophenolate (CELLCEPT) 250 MG capsule Take 250 mg by mouth 2 (two) times daily.     Marland Kitchen omeprazole (PRILOSEC) 20 MG capsule Take 20 mg by mouth 2 (two) times daily as needed (for heart burn).     . ondansetron (ZOFRAN) 4 MG tablet Take 1 tablet (4 mg total) by mouth every 6 (six) hours as needed for nausea or vomiting. 30 tablet 0  . pravastatin (PRAVACHOL) 20 MG tablet Take 20 mg by mouth at bedtime.     . predniSONE (DELTASONE) 5 MG tablet Take 5 mg by mouth daily with breakfast.     . senna (SENOKOT) 8.6 MG TABS tablet Take 1 tablet (8.6 mg total) by mouth daily as needed for moderate constipation. 90 tablet 0  . Tacrolimus ER (ENVARSUS XR) 1 MG TB24 Take 4 mg by mouth daily.     . traMADol (ULTRAM) 50 MG tablet Take 1 tablet (50 mg total) by mouth every 6 (six) hours as needed. (Patient taking differently: Take 50 mg by mouth every 6 (six) hours as needed for moderate pain. ) 15 tablet 0     Allergies  Allergen Reactions  . Lisinopril Cough  . Other Rash    Causes welts also  . Tape Rash and Other (See Comments)    Causes welts also  :  Family History  Problem Relation Age of Onset  . Diabetes  Father   . Diabetes Paternal Aunt   . Diabetes Paternal Grandmother   . Alcohol abuse Paternal Grandmother   . Alcohol abuse Paternal Uncle   . Cancer Maternal Grandmother        breast  . Breast cancer Maternal Grandmother   . Alcohol abuse Paternal Grandfather   :  Social History   Socioeconomic History  . Marital status: Single    Spouse name: Not on file  . Number of children: 3  . Years of education: 43  . Highest education level: Not on file  Occupational History  Comment: NA  Tobacco Use  . Smoking status: Never Smoker  . Smokeless tobacco: Never Used  Substance and Sexual Activity  . Alcohol use: No  . Drug use: No  . Sexual activity: Not Currently    Birth control/protection: None  Other Topics Concern  . Not on file  Social History Narrative   Lives at home with children   Caffeine - 10/27/16 "addicted to [employer], drank all day long", currently Mtn Dew 12 oz/week   Social Determinants of Health   Financial Resource Strain:   . Difficulty of Paying Living Expenses: Not on file  Food Insecurity:   . Worried About Charity fundraiser in the Last Year: Not on file  . Ran Out of Food in the Last Year: Not on file  Transportation Needs:   . Lack of Transportation (Medical): Not on file  . Lack of Transportation (Non-Medical): Not on file  Physical Activity:   . Days of Exercise per Week: Not on file  . Minutes of Exercise per Session: Not on file  Stress:   . Feeling of Stress : Not on file  Social Connections:   . Frequency of Communication with Friends and Family: Not on file  . Frequency of Social Gatherings with Friends and Family: Not on file  . Attends Religious Services: Not on file  . Active Member of Clubs or Organizations: Not on file  . Attends Archivist Meetings: Not on file  . Marital Status: Not on file  Intimate Partner Violence:   . Fear of Current or Ex-Partner: Not on file  . Emotionally Abused: Not on file  . Physically  Abused: Not on file  . Sexually Abused: Not on file  :  Review of Systems: A comprehensive 14 point review of systems was negative except as noted in the HPI.  Exam: Patient Vitals for the past 24 hrs:  BP Temp Temp src Pulse Resp SpO2 Height Weight  05/27/20 1100 -- -- -- (!) 140 (!) 24 97 % -- --  05/27/20 1045 (!) 123/91 -- -- (!) 112 (!) 26 99 % -- --  05/27/20 1030 124/87 -- -- (!) 106 (!) 23 100 % -- --  05/27/20 1015 (!) 132/92 -- -- (!) 109 (!) 23 100 % -- --  05/27/20 1000 129/89 -- -- (!) 107 (!) 24 100 % -- --  05/27/20 0948 (!) 123/93 97.8 F (36.6 C) Oral (!) 115 19 100 % -- --  05/27/20 0945 127/90 -- -- (!) 109 (!) 27 100 % -- --  05/27/20 0930 125/90 -- -- (!) 107 20 100 % -- --  05/27/20 0915 124/86 -- -- (!) 103 (!) 23 100 % -- --  05/27/20 0900 127/90 -- -- 100 (!) 22 100 % -- --  05/27/20 0846 -- 98 F (36.7 C) -- -- -- -- -- --  05/27/20 0845 120/87 -- -- (!) 113 19 99 % -- --  05/27/20 0830 (!) 124/92 -- -- (!) 104 (!) 21 100 % -- --  05/27/20 0815 117/88 -- -- (!) 124 20 100 % -- --  05/27/20 0800 116/86 -- -- (!) 102 19 98 % -- --  05/27/20 0745 122/87 -- -- (!) 103 (!) 22 100 % -- --  05/27/20 0730 121/87 -- -- (!) 102 (!) 21 100 % -- --  05/27/20 0715 118/88 -- -- (!) 106 (!) 22 98 % -- --  05/27/20 0700 116/84 98.4 F (36.9 C) -- (!) 113 (!)  26 100 % -- --  05/27/20 0645 117/85 -- -- (!) 114 (!) 23 99 % -- --  05/27/20 0636 (!) 125/99 98.5 F (36.9 C) Oral (!) 114 18 99 % -- --  05/27/20 0630 (!) 125/99 -- -- -- 17 99 % -- --  05/27/20 0615 99/73 -- -- -- 20 99 % -- --  05/27/20 0600 106/72 -- -- -- 16 99 % -- --  05/27/20 0545 103/75 -- -- (!) 110 20 99 % -- --  05/27/20 0515 (!) 132/96 -- -- (!) 108 (!) 21 99 % -- --  05/27/20 0500 (!) 129/98 -- -- (!) 108 19 99 % -- --  05/27/20 0445 (!) 123/92 98.1 F (36.7 C) Oral (!) 106 (!) 33 99 % -- --  05/27/20 0430 (!) 125/91 -- -- (!) 102 (!) 22 100 % -- --  05/27/20 0415 (!) 128/91 -- -- -- (!) 22 100  % -- --  05/27/20 0400 (!) 129/95 -- -- -- (!) 24 100 % -- --  05/27/20 0345 120/89 -- -- (!) 108 (!) 23 99 % -- --  05/27/20 0330 129/87 98.5 F (36.9 C) Oral -- 19 100 % -- --  05/27/20 0315 (!) 125/95 -- -- (!) 116 (!) 21 100 % -- --  05/27/20 0312 (!) 127/92 98.5 F (36.9 C) Oral (!) 114 (!) 23 -- -- --  05/27/20 0245 126/88 -- -- (!) 117 (!) 24 99 % -- --  05/27/20 0230 (!) 133/93 -- -- (!) 112 (!) 24 99 % -- --  05/27/20 0215 -- -- -- -- -- 100 % -- --  05/27/20 0200 (!) 122/96 -- -- -- (!) 23 100 % -- --  05/27/20 0145 -- -- -- -- -- 100 % -- --  05/27/20 0130 (!) 132/92 -- -- -- (!) 21 100 % -- --  05/27/20 0115 -- -- -- -- -- 100 % -- --  05/27/20 0100 (!) 133/96 -- -- -- 18 100 % -- --  05/27/20 0030 (!) 129/93 -- -- -- (!) 24 100 % -- --  05/27/20 0000 122/83 -- -- (!) 120 19 100 % -- --  05/26/20 2330 110/83 -- -- -- 18 100 % -- --  05/26/20 2315 (!) 128/95 -- -- 76 19 100 % -- --  05/26/20 2312 123/86 -- -- (!) 115 (!) 22 100 % 5\' 7"  (1.702 m) 64.9 kg  05/26/20 2232 106/79 97.7 F (36.5 C) Oral (!) 133 18 100 % -- --    General: Appears fatigued, no distress Eyes:  no scleral icterus.   ENT: Thrush noted on tongue Lymphatics: No palpable cervical, supraclavicular, or axillary lymphadenopathy.  She has palpable inguinal lymphadenopathy bilaterally. Respiratory: lungs were clear bilaterally without wheezing or crackles.   Cardiovascular:  Regular rate and rhythm, S1/S2, without murmur, rub or gallop.  There was no pedal edema.   GI:  abdomen was soft, flat, nontender, nondistended, without organomegaly.   Musculoskeletal: Strength symmetrical in the upper and lower extremities Skin exam was without echymosis, petichae.   Neuro exam was nonfocal. Patient was alert and oriented.  Attention was good.   Language was appropriate.  Mood was normal without depression.  Speech was not pressured.  Thought content was not tangential.     Lab Results  Component Value Date   WBC  7.6 05/27/2020   HGB 4.0 (LL) 05/27/2020   HCT 14.0 (L) 05/27/2020   PLT 376 05/27/2020   GLUCOSE 96 05/27/2020  ALT 105 (H) 05/27/2020   AST 336 (H) 05/27/2020   NA 137 05/27/2020   K 4.4 05/27/2020   CL 107 05/27/2020   CREATININE 1.13 (H) 05/27/2020   BUN 17 05/27/2020   CO2 17 (L) 05/27/2020    CT ABDOMEN PELVIS WO CONTRAST  Result Date: 05/16/2020 CLINICAL DATA:  Bilious emesis, renal transplant EXAM: CT ABDOMEN AND PELVIS WITHOUT CONTRAST TECHNIQUE: Multidetector CT imaging of the abdomen and pelvis was performed following the standard protocol without IV contrast. COMPARISON:  None. FINDINGS: Lower chest: No acute abnormality. Hepatobiliary: No focal liver abnormality is seen. No gallstones, gallbladder wall thickening, or biliary dilatation. Pancreas: Unremarkable Spleen: Unremarkable Adrenals/Urinary Tract: The adrenal glands are unremarkable. The native kidneys are markedly atrophic in keeping with changes of chronic renal failure. Previously noted right transplant kidney has been surgically removed and a residual partially calcified complex collection is seen within the resection bed likely representing a postoperative complex seroma or chronic hematoma. A new transplant kidney is seen within the left iliac fossa and is normal in size and demonstrates normal cortical thickness. There is no hydronephrosis. The bladder is unremarkable. Stomach/Bowel: Stomach, small bowel, and large bowel are unremarkable. Appendix normal. No free intraperitoneal gas or fluid. Vascular/Lymphatic: Since the prior examination, there has developed extensive pathologic adenopathy within the left periaortic, aortocaval, and retrocaval lymph node groups, as well as the bilateral common iliac, left external and bilateral inguinal lymph node groups. Bulky adenopathy is compatible with lymphoma. Index lymph node anterior to the abdominal aorta at axial image # 37/3 measures 2.4 x 4.2 cm in greatest dimension.  Moderate aortoiliac atherosclerotic calcification is present without evidence of aneurysm. Reproductive: Uterus and bilateral adnexa are unremarkable. Other: Rectum unremarkable. Musculoskeletal: Bone island within the right ischium. No lytic or blastic bone lesions are seen. IMPRESSION: Interval development of extensive retroperitoneal, pelvic, and inguinal lymphadenopathy most in keeping with lymphoma in this immunocompromised patient. Left inguinal and external iliac lymphadenopathy should be easily amenable to ultrasound-guided biopsy for further evaluation. Interval explantation of right renal transplant and implantation of new transplant kidney within the left iliac fossa. Aortic Atherosclerosis (ICD10-I70.0). Electronically Signed   By: Fidela Salisbury MD   On: 05/16/2020 05:49   CT Angio Chest PE W and/or Wo Contrast  Result Date: 05/27/2020 CLINICAL DATA:  PE suspected, high probability EXAM: CT ANGIOGRAPHY CHEST WITH CONTRAST TECHNIQUE: Multidetector CT imaging of the chest was performed using the standard protocol during bolus administration of intravenous contrast. Multiplanar CT image reconstructions and MIPs were obtained to evaluate the vascular anatomy. CONTRAST:  78mL OMNIPAQUE IOHEXOL 350 MG/ML SOLN COMPARISON:  None. FINDINGS: Cardiovascular: Satisfactory opacification of the pulmonary arteries to the segmental level. No evidence of pulmonary embolism. Normal heart size. No pericardial effusion. Mediastinum/Nodes: Retrocrural lymphadenopathy, reference abdominal CT. Lungs/Pleura: 2 mm peripheral right upper lobe pulmonary nodule, attention on follow-up. There is no edema, consolidation, effusion, or pneumothorax. Upper Abdomen: Reported on dedicated study. Musculoskeletal: No acute or aggressive finding. Review of the MIP images confirms the above findings. IMPRESSION: Negative for pulmonary embolism or other acute finding. Electronically Signed   By: Monte Fantasia M.D.   On: 05/27/2020 06:02    CT ABDOMEN PELVIS W CONTRAST  Result Date: 05/27/2020 CLINICAL DATA:  Abdominal distension.  Adenopathy. EXAM: CT ABDOMEN AND PELVIS WITH CONTRAST TECHNIQUE: Multidetector CT imaging of the abdomen and pelvis was performed using the standard protocol following bolus administration of intravenous contrast. CONTRAST:  2mL OMNIPAQUE IOHEXOL 350 MG/ML SOLN COMPARISON:  Noncontrast abdominal CT 05/16/2020 FINDINGS: Lower chest:  No contributory findings. Hepatobiliary: Multiple ill-defined masses within the liver. One of the largest is in the subcapsular inferior right lobe on 5:33 at 2 cm.No evidence of biliary obstruction or stone. Pancreas: Unremarkable. Spleen: Unremarkable. Adrenals/Urinary Tract: Negative adrenals. Native renal atrophy. Solid masslike appearance at the left renal hilum which measures 2.2 cm. There is a transplant kidney in the left lower quadrant with scarring involving the left-sided pole. There is pelviectasis without overt hydronephrosis. Failed transplant in the right lower quadrant. Unremarkable bladder. Stomach/Bowel: No obstruction. No inflammatory bowel wall thickening. Vascular/Lymphatic: Bulky retroperitoneal lymphadenopathy. The nodes show areas of low-density and calcification, which would be heterogeneous for untreated lymphoma. For measurement purposes a node ventral to the aorta and cava on 5:44 measures 42 x 26 mm. There is also bilateral inguinal lymphadenopathy including a left inguinal node measuring 16 mm long axis. Adenopathy continues to the retrocural station. Atherosclerotic calcification, premature for age. Reproductive:Intramural fibroids measuring up to 19 mm anteriorly. Other: No ascites or pneumoperitoneum. Musculoskeletal: No acute abnormalities. IMPRESSION: 1. Redemonstrated inguinal and intra-abdominal lymphadenopathy. With the benefit of contrast, multiple liver lesions are also seen. Findings could reflect metastatic disease, lymphoma, or posttransplant  lymphoproliferative disease. Left inguinal nodes are the most amenable to excisional biopsy. 2. 2.2 cm native left lower pole renal mass which may be related to#1. 3. Left lower quadrant transplant kidney with areas of cortical scarring. No hydronephrosis or perinephric collection. Electronically Signed   By: Monte Fantasia M.D.   On: 05/27/2020 06:16   DG Chest Port 1 View  Result Date: 05/26/2020 CLINICAL DATA:  Chest pain and vomiting EXAM: PORTABLE CHEST 1 VIEW COMPARISON:  Chest x-ray 02/03/2015. FINDINGS: The heart size and mediastinal contours are within normal limits. No focal consolidation. No pulmonary edema. No pleural effusion. No pneumothorax. No acute osseous abnormality. IMPRESSION: No active disease. Electronically Signed   By: Iven Finn M.D.   On: 05/26/2020 23:40     CT ABDOMEN PELVIS WO CONTRAST  Result Date: 05/16/2020 CLINICAL DATA:  Bilious emesis, renal transplant EXAM: CT ABDOMEN AND PELVIS WITHOUT CONTRAST TECHNIQUE: Multidetector CT imaging of the abdomen and pelvis was performed following the standard protocol without IV contrast. COMPARISON:  None. FINDINGS: Lower chest: No acute abnormality. Hepatobiliary: No focal liver abnormality is seen. No gallstones, gallbladder wall thickening, or biliary dilatation. Pancreas: Unremarkable Spleen: Unremarkable Adrenals/Urinary Tract: The adrenal glands are unremarkable. The native kidneys are markedly atrophic in keeping with changes of chronic renal failure. Previously noted right transplant kidney has been surgically removed and a residual partially calcified complex collection is seen within the resection bed likely representing a postoperative complex seroma or chronic hematoma. A new transplant kidney is seen within the left iliac fossa and is normal in size and demonstrates normal cortical thickness. There is no hydronephrosis. The bladder is unremarkable. Stomach/Bowel: Stomach, small bowel, and large bowel are  unremarkable. Appendix normal. No free intraperitoneal gas or fluid. Vascular/Lymphatic: Since the prior examination, there has developed extensive pathologic adenopathy within the left periaortic, aortocaval, and retrocaval lymph node groups, as well as the bilateral common iliac, left external and bilateral inguinal lymph node groups. Bulky adenopathy is compatible with lymphoma. Index lymph node anterior to the abdominal aorta at axial image # 37/3 measures 2.4 x 4.2 cm in greatest dimension. Moderate aortoiliac atherosclerotic calcification is present without evidence of aneurysm. Reproductive: Uterus and bilateral adnexa are unremarkable. Other: Rectum unremarkable. Musculoskeletal: Bone island within the right ischium.  No lytic or blastic bone lesions are seen. IMPRESSION: Interval development of extensive retroperitoneal, pelvic, and inguinal lymphadenopathy most in keeping with lymphoma in this immunocompromised patient. Left inguinal and external iliac lymphadenopathy should be easily amenable to ultrasound-guided biopsy for further evaluation. Interval explantation of right renal transplant and implantation of new transplant kidney within the left iliac fossa. Aortic Atherosclerosis (ICD10-I70.0). Electronically Signed   By: Fidela Salisbury MD   On: 05/16/2020 05:49   CT Angio Chest PE W and/or Wo Contrast  Result Date: 05/27/2020 CLINICAL DATA:  PE suspected, high probability EXAM: CT ANGIOGRAPHY CHEST WITH CONTRAST TECHNIQUE: Multidetector CT imaging of the chest was performed using the standard protocol during bolus administration of intravenous contrast. Multiplanar CT image reconstructions and MIPs were obtained to evaluate the vascular anatomy. CONTRAST:  30mL OMNIPAQUE IOHEXOL 350 MG/ML SOLN COMPARISON:  None. FINDINGS: Cardiovascular: Satisfactory opacification of the pulmonary arteries to the segmental level. No evidence of pulmonary embolism. Normal heart size. No pericardial effusion.  Mediastinum/Nodes: Retrocrural lymphadenopathy, reference abdominal CT. Lungs/Pleura: 2 mm peripheral right upper lobe pulmonary nodule, attention on follow-up. There is no edema, consolidation, effusion, or pneumothorax. Upper Abdomen: Reported on dedicated study. Musculoskeletal: No acute or aggressive finding. Review of the MIP images confirms the above findings. IMPRESSION: Negative for pulmonary embolism or other acute finding. Electronically Signed   By: Monte Fantasia M.D.   On: 05/27/2020 06:02   CT ABDOMEN PELVIS W CONTRAST  Result Date: 05/27/2020 CLINICAL DATA:  Abdominal distension.  Adenopathy. EXAM: CT ABDOMEN AND PELVIS WITH CONTRAST TECHNIQUE: Multidetector CT imaging of the abdomen and pelvis was performed using the standard protocol following bolus administration of intravenous contrast. CONTRAST:  60mL OMNIPAQUE IOHEXOL 350 MG/ML SOLN COMPARISON:  Noncontrast abdominal CT 05/16/2020 FINDINGS: Lower chest:  No contributory findings. Hepatobiliary: Multiple ill-defined masses within the liver. One of the largest is in the subcapsular inferior right lobe on 5:33 at 2 cm.No evidence of biliary obstruction or stone. Pancreas: Unremarkable. Spleen: Unremarkable. Adrenals/Urinary Tract: Negative adrenals. Native renal atrophy. Solid masslike appearance at the left renal hilum which measures 2.2 cm. There is a transplant kidney in the left lower quadrant with scarring involving the left-sided pole. There is pelviectasis without overt hydronephrosis. Failed transplant in the right lower quadrant. Unremarkable bladder. Stomach/Bowel: No obstruction. No inflammatory bowel wall thickening. Vascular/Lymphatic: Bulky retroperitoneal lymphadenopathy. The nodes show areas of low-density and calcification, which would be heterogeneous for untreated lymphoma. For measurement purposes a node ventral to the aorta and cava on 5:44 measures 42 x 26 mm. There is also bilateral inguinal lymphadenopathy including a  left inguinal node measuring 16 mm long axis. Adenopathy continues to the retrocural station. Atherosclerotic calcification, premature for age. Reproductive:Intramural fibroids measuring up to 19 mm anteriorly. Other: No ascites or pneumoperitoneum. Musculoskeletal: No acute abnormalities. IMPRESSION: 1. Redemonstrated inguinal and intra-abdominal lymphadenopathy. With the benefit of contrast, multiple liver lesions are also seen. Findings could reflect metastatic disease, lymphoma, or posttransplant lymphoproliferative disease. Left inguinal nodes are the most amenable to excisional biopsy. 2. 2.2 cm native left lower pole renal mass which may be related to#1. 3. Left lower quadrant transplant kidney with areas of cortical scarring. No hydronephrosis or perinephric collection. Electronically Signed   By: Monte Fantasia M.D.   On: 05/27/2020 06:16   DG Chest Port 1 View  Result Date: 05/26/2020 CLINICAL DATA:  Chest pain and vomiting EXAM: PORTABLE CHEST 1 VIEW COMPARISON:  Chest x-ray 02/03/2015. FINDINGS: The heart size and mediastinal contours are within  normal limits. No focal consolidation. No pulmonary edema. No pleural effusion. No pneumothorax. No acute osseous abnormality. IMPRESSION: No active disease. Electronically Signed   By: Iven Finn M.D.   On: 05/26/2020 23:40   Assessment and Plan:   1.  Extensive retroperitoneal, pelvic, and inguinal lymphadenopathy, liver lesions, and renal mass -05/27/2020 CT abdomen pelvis with contrast - "1. Redemonstrated inguinal and intra-abdominal lymphadenopathy. With the benefit of contrast, multiple liver lesions are also seen. Findings could reflect metastatic disease, lymphoma, or posttransplant lymphoproliferative disease. Left inguinal nodes are the most amenable to excisional biopsy. 2. 2.2 cm native left lower pole renal mass which may be related to#1. 3. Left lower quadrant transplant kidney with areas of cortical scarring. No hydronephrosis or  perinephric collection." -Discussed findings with the patient as well as potential differentials including lymphoma, metastatic cancer, or post transplant lymphoproliferative disease. -05/27/2020 CT-guided biopsy of left inguinal lymph node.  Results pending. -We will follow up on biopsy results once available to discuss diagnosis, prognosis, and treatment options.  2.  Anemia -The patient's baseline hemoglobin is in the 9-10 range. -We will perform additional work-up to evaluate etiology of anemia including iron studies, vitamin B12 level, erythropoietin level, haptoglobin, DAT. -Transfuse PRBCs for hemoglobin less than 7.  3.  Oral candidiasis -The patient is immunocompromised and has oral candidiasis. -Agree with fluconazole 100 mg daily.  4.  Chronic glomerulonephritis-status post renal transplant at [facility] in July 2019   Thank you for this referral.   Mikey Bussing, DNP, AGPCNP-BC, AOCNP   ADDENDUM   .Patient was Personally and independently interviewed, examined and relevant elements of the history of present illness were reviewed in details and an assessment and plan was created. All elements of the patient's history of present illness , assessment and plan were discussed in details with Mikey Bussing, DNP, AGPCNP-BC, AOCNP. The above documentation reflects our combined findings assessment and plan.  Patient is a 44 year old lady with a history of renal transplantation at [facility] in July 2019 on chronic immunosuppressive therapy with CellCept and tacrolimus and prednisone with chronic anemia admitted with chest pain and shortness of breath likely related to symptomatic anemia with a hemoglobin of 4.  Patient has been transfused and repeat hemoglobin is 10.9 after 2 units of PRBCs.  The discrepancy suggests either the first hemoglobin level was a diluted sample or the repeat hemoglobin was drawn from the IV line fluids and blood transfusion was given.  Patient had a CT chest abdomen  and pelvis and was noted to have extensive retroperitoneal abdominal and pelvic lymphadenopathy as well as a mass in her native kidney and multiple ill-defined liver lesions.  Imaging findings are concerning for post transplant EBV driven lymphoproliferative disorder versus metastatic renal cell carcinoma versus other metastatic lesions.  Patient is to get an ultrasound-guided biopsy of one of her inguinal lymph nodes today. Anemia work-up has been ordered. We will get EBV PCR titers. HIV and hepatitis C testing. Transfuse as needed to maintain hemoglobin more than 8. No thrombocytopenia to suggest tacrolimus related TTP. Will followup with Biopsy results for further recommendations. If this is immunosuppression related lymphoproliferative disorder or lymphoma -- nephrology will need to weigh in regarding adjusting immunosuppression.  Sullivan Lone MD MS

## 2020-05-27 NOTE — Progress Notes (Addendum)
FPTS Interim Progress Note  S: Interviewed patient at bedside in the ED.  She reports some pain from her biopsy site. She reports her abdominal pain from earlier in the day has resolved. She reports no other concerns.  O: BP 120/84   Pulse (!) 110   Temp 97.8 F (36.6 C) (Oral)   Resp (!) 28   Ht 5\' 7"  (1.702 m)   Wt 64.9 kg   LMP 05/05/2020   SpO2 99%   BMI 22.40 kg/m   Physical Exam Vitals and nursing note reviewed.  Constitutional:      General: She is not in acute distress.    Appearance: Normal appearance. She is ill-appearing. She is not toxic-appearing or diaphoretic.  HENT:     Head: Normocephalic and atraumatic.  Cardiovascular:     Rate and Rhythm: Regular rhythm. Tachycardia present.     Pulses: Normal pulses.          Radial pulses are 2+ on the right side and 2+ on the left side.       Dorsalis pedis pulses are 2+ on the right side and 2+ on the left side.     Heart sounds: Normal heart sounds, S1 normal and S2 normal. No murmur heard.   Pulmonary:     Effort: Pulmonary effort is normal. No respiratory distress.     Breath sounds: Normal breath sounds. No wheezing.  Abdominal:     General: There is no distension.     Tenderness: There is no abdominal tenderness.       Comments: Bandage in place, no bleeding present. No erythema. No hematoma forming.  Musculoskeletal:     Right lower leg: Edema present.     Left lower leg: Edema present.  Neurological:     Mental Status: She is alert.      A/P: Lymph Node Biopsy: Bandage is clean, dry, and intact. No erythema or bleeding present. Reports pain around site. No hematoma present. -Dilaudid 0.15 PRN mg q4 for pain ordered   Will continue rest of care plan as outlined   Briant Cedar, MD 05/27/2020, 9:23 PM PGY-1, [facility] pager [phone]

## 2020-05-27 NOTE — ED Notes (Signed)
Pt requested to receive medications after biopsy procedure because she does not want to take medications on empty stomach due to existing nausea.

## 2020-05-27 NOTE — ED Notes (Signed)
Patient in bed resting post procedure. Discussed plans for enema and refused at this time d/t bedrest status,

## 2020-05-27 NOTE — Procedures (Signed)
Pre Procedure Dx: Inguinal lymphadenopathy Post Procedural Dx: Same  Technically successful US guided biopsy of indeterminate left inguinal lymph node.  EBL: None No immediate complications.   Jay Sonnie Pawloski, MD Pager #: [phone]    

## 2020-05-27 NOTE — ED Provider Notes (Signed)
Care assumed from [facility], PA-C at shift change with admission pending.   In brief, this patient is a 43 y.o. F with PMH/o renal transplant (currently on cellcept) who presented for evaluation of SOB, CP that began last night. She had previously been seen in th ED on 9/22 and was found to have some lymphadenopathy on her CT scan. She has not followed up with her PCP. Last night, she started experiencing some CP, SOB, prompting ED visit.    Physical Exam  BP 103/75   Pulse (!) 110   Temp 98.1 F (36.7 C) (Oral)   Resp 20   Ht 5\' 7"  (1.702 m)   Wt 64.9 kg   LMP 05/05/2020   SpO2 99%   BMI 22.40 kg/m   Physical Exam  ED Course/Procedures   Clinical Course as of May 27 638  Mon May 27, 2020  0200 Noted.  Patient consented for blood transfusion  Hemoglobin(!!): 4.0 [HM]  0616  Retrocrural lymphadenopathy  CT Angio Chest PE W and/or Wo Contrast [HM]  0616 Tachycardia persists  Pulse Rate(!): 110 [HM]    Clinical Course User Index [HM] Muthersbaugh, Jarrett Soho, PA-C    Procedures  Results for orders placed or performed during the hospital encounter of 05/26/20 (from the past 24 hour(s))  Lipase, blood     Status: None   Collection Time: 05/27/20  1:13 AM  Result Value Ref Range   Lipase 35 11 - 51 U/L  Comprehensive metabolic panel     Status: Abnormal   Collection Time: 05/27/20  1:13 AM  Result Value Ref Range   Sodium 137 135 - 145 mmol/L   Potassium 4.4 3.5 - 5.1 mmol/L   Chloride 107 98 - 111 mmol/L   CO2 17 (L) 22 - 32 mmol/L   Glucose, Bld 96 70 - 99 mg/dL   BUN 17 6 - 20 mg/dL   Creatinine, Ser 1.13 (H) 0.44 - 1.00 mg/dL   Calcium 8.0 (L) 8.9 - 10.3 mg/dL   Total Protein 5.6 (L) 6.5 - 8.1 g/dL   Albumin 2.0 (L) 3.5 - 5.0 g/dL   AST 336 (H) 15 - 41 U/L   ALT 105 (H) 0 - 44 U/L   Alkaline Phosphatase 242 (H) 38 - 126 U/L   Total Bilirubin 1.5 (H) 0.3 - 1.2 mg/dL   GFR calc non Af Amer 59 (L) >60 mL/min   GFR calc Af Amer >60 >60 mL/min   Anion gap  13 5 - 15  CBC     Status: Abnormal   Collection Time: 05/27/20  1:13 AM  Result Value Ref Range   WBC 7.6 4.0 - 10.5 K/uL   RBC 1.59 (L) 3.87 - 5.11 MIL/uL   Hemoglobin 4.0 (LL) 12.0 - 15.0 g/dL   HCT 14.0 (L) 36 - 46 %   MCV 88.1 80.0 - 100.0 fL   MCH 25.2 (L) 26.0 - 34.0 pg   MCHC 28.6 (L) 30.0 - 36.0 g/dL   RDW 15.8 (H) 11.5 - 15.5 %   Platelets 376 150 - 400 K/uL   nRBC 0.0 0.0 - 0.2 %  Type and screen     Status: None (Preliminary result)   Collection Time: 05/27/20  1:13 AM  Result Value Ref Range   ABO/RH(D) O POS    Antibody Screen POS    Sample Expiration 05/30/2020,2359    Antibody Identification ANTI C    Unit Number [id number]    Blood Component Type RED CELLS,LR  Unit division 00    Status of Unit ISSUED    Donor AG Type NEGATIVE FOR C ANTIGEN NEGATIVE FOR KIDD B ANTIGEN    Transfusion Status OK TO TRANSFUSE    Crossmatch Result COMPATIBLE    Unit Number [id number]    Blood Component Type RED CELLS,LR    Unit division 00    Status of Unit ISSUED    Donor AG Type NEGATIVE FOR C ANTIGEN NEGATIVE FOR KIDD B ANTIGEN    Transfusion Status OK TO TRANSFUSE    Crossmatch Result COMPATIBLE    Unit Number [id number]    Blood Component Type RED CELLS,LR    Unit division 00    Status of Unit ALLOCATED    Transfusion Status OK TO TRANSFUSE    Crossmatch Result COMPATIBLE    Unit Number [id number]    Blood Component Type RED CELLS,LR    Unit division 00    Status of Unit ALLOCATED    Transfusion Status OK TO TRANSFUSE    Crossmatch Result COMPATIBLE   Troponin I (High Sensitivity)     Status: None   Collection Time: 05/27/20  1:13 AM  Result Value Ref Range   Troponin I (High Sensitivity) 13 <18 ng/L  I-Stat beta hCG blood, ED     Status: None   Collection Time: 05/27/20  1:27 AM  Result Value Ref Range   I-stat hCG, quantitative <5.0 <5 mIU/mL   Comment 3          Prepare RBC (crossmatch)     Status: None   Collection Time: 05/27/20  2:00 AM   Result Value Ref Range   Order Confirmation      ORDER PROCESSED BY BLOOD BANK Performed at [facility], [place] [address] [place], [place] [address]   Respiratory Panel by RT PCR (Flu A&B, Covid) - Nasopharyngeal Swab     Status: None   Collection Time: 05/27/20  4:47 AM   Specimen: Nasopharyngeal Swab  Result Value Ref Range   SARS Coronavirus 2 by RT PCR NEGATIVE NEGATIVE   Influenza A by PCR NEGATIVE NEGATIVE   Influenza B by PCR NEGATIVE NEGATIVE  Urinalysis, Routine w reflex microscopic Urine, Clean Catch     Status: Abnormal   Collection Time: 05/27/20  6:36 AM  Result Value Ref Range   Color, Urine YELLOW YELLOW   APPearance CLEAR CLEAR   Specific Gravity, Urine 1.020 1.005 - 1.030   pH 5.0 5.0 - 8.0   Glucose, UA NEGATIVE NEGATIVE mg/dL   Hgb urine dipstick NEGATIVE NEGATIVE   Bilirubin Urine NEGATIVE NEGATIVE   Ketones, ur 20 (A) NEGATIVE mg/dL   Protein, ur NEGATIVE NEGATIVE mg/dL   Nitrite NEGATIVE NEGATIVE   Leukocytes,Ua NEGATIVE NEGATIVE    MDM   PLAN: Patient with a Hgb of 4. No active bleeding. Unfortunately, she only matches with one unit of blood here in the hospital. Blood is being transferred from [place]. Patient will be admitted for symptomatic anemia as well as lymphadopathy concerning for lymphoma.   MDM:  Discussed with family medicine who accepts patient for admission.    1. Symptomatic anemia   2. Dehydration   3. Tachycardia   4. Non-intractable vomiting with nausea, unspecified vomiting type    Portions of this note were generated with Dragon dictation software. Dictation errors may occur despite best attempts at proofreading.     Volanda Napoleon, PA-C 05/27/20 [facility], [place], DO 05/27/20 2300

## 2020-05-27 NOTE — H&P (Signed)
Chief Complaint: Lymphadenopathy  Referring Physician(s): Nelson Chimes  Supervising Physician: Sandi Mariscal  Patient Status: [facility] - ED  History of Present Illness: Candace Griffin is a 44 y.o. female hx of HTN and renal transport.  She presented to the ED complaining of vomiting and abdominal pain.  She has had several weeks of mild abd pain and vomiting but was able to keep down fluids and medication.   This morning she developed severe chest pain with shortness of breath.    She also reports associated abdominal distension and pain for the last week.  She also reports low-grade fevers and night sweats.  COVID test is negative.  CT scan from 05/15/2020 showed significant lymphadenopathy concerning for lymphoma.  We are asked to perform an image guided biopsy.  She is NPO. No blood thinners.  Past Medical History:  Diagnosis Date  . Chronic glomerulonephritis   . Dialysis patient ([place])   . End stage renal disease ([place])   . GERD (gastroesophageal reflux disease)   . History of renal transplant 10/14/2011  . Hypertension     Past Surgical History:  Procedure Laterality Date  . COLPOSCOPY  2016  . diaylsis shunt Right    arm  . KIDNEY TRANSPLANT  10-14-2011   deceased donor kidney    Allergies: Lisinopril, Other, and Tape  Medications: Prior to Admission medications   Medication Sig Start Date End Date Taking? Authorizing Provider  cholecalciferol (VITAMIN D3) 25 MCG (1000 UNIT) tablet Take 1,000 Units by mouth daily.   Yes [provider]  mycophenolate (CELLCEPT) 250 MG capsule Take 250 mg by mouth 2 (two) times daily.  07/19/18  Yes [provider]  omeprazole (PRILOSEC) 20 MG capsule Take 20 mg by mouth 2 (two) times daily as needed (for heart burn).    Yes [provider]  ondansetron (ZOFRAN) 4 MG tablet Take 1 tablet (4 mg total) by mouth every 6 (six) hours as needed for nausea or vomiting. 4/43/15  Yes Delora Fuel, MD   pravastatin (PRAVACHOL) 20 MG tablet Take 20 mg by mouth at bedtime.  05/29/19  Yes [provider]  predniSONE (DELTASONE) 5 MG tablet Take 5 mg by mouth daily with breakfast.  07/25/19  Yes [provider]  senna (SENOKOT) 8.6 MG TABS tablet Take 1 tablet (8.6 mg total) by mouth daily as needed for moderate constipation. 02/19/20  Yes Anderson, Chelsey L, DO  Tacrolimus ER (ENVARSUS XR) 1 MG TB24 Take 4 mg by mouth daily.  12/01/19  Yes [provider]  traMADol (ULTRAM) 50 MG tablet Take 1 tablet (50 mg total) by mouth every 6 (six) hours as needed. Patient taking differently: Take 50 mg by mouth every 6 (six) hours as needed for moderate pain.  4/00/86  Yes Delora Fuel, MD     Family History  Problem Relation Age of Onset  . Diabetes Father   . Diabetes Paternal Aunt   . Diabetes Paternal Grandmother   . Alcohol abuse Paternal Grandmother   . Alcohol abuse Paternal Uncle   . Cancer Maternal Grandmother        breast  . Breast cancer Maternal Grandmother   . Alcohol abuse Paternal Grandfather     Social History   Socioeconomic History  . Marital status: Single    Spouse name: Not on file  . Number of children: 3  . Years of education: 97  . Highest education level: Not on file  Occupational History  Comment: NA  Tobacco Use  . Smoking status: Never Smoker  . Smokeless tobacco: Never Used  Substance and Sexual Activity  . Alcohol use: No  . Drug use: No  . Sexual activity: Not Currently    Birth control/protection: None  Other Topics Concern  . Not on file  Social History Narrative   Lives at home with children   Caffeine - 10/27/16 "addicted to [employer], drank all day long", currently Mtn Dew 12 oz/week   Social Determinants of Health   Financial Resource Strain:   . Difficulty of Paying Living Expenses: Not on file  Food Insecurity:   . Worried About Charity fundraiser in the Last Year: Not on file  . Ran Out of Food in the Last Year:  Not on file  Transportation Needs:   . Lack of Transportation (Medical): Not on file  . Lack of Transportation (Non-Medical): Not on file  Physical Activity:   . Days of Exercise per Week: Not on file  . Minutes of Exercise per Session: Not on file  Stress:   . Feeling of Stress : Not on file  Social Connections:   . Frequency of Communication with Friends and Family: Not on file  . Frequency of Social Gatherings with Friends and Family: Not on file  . Attends Religious Services: Not on file  . Active Member of Clubs or Organizations: Not on file  . Attends Archivist Meetings: Not on file  . Marital Status: Not on file     Review of Systems: A 12 point ROS discussed and pertinent positives are indicated in the HPI above.  All other systems are negative.  Review of Systems  Vital Signs: BP (!) 123/93   Pulse (!) 115   Temp 97.8 F (36.6 C) (Oral)   Resp 19   Ht 5\' 7"  (1.702 m)   Wt 64.9 kg   LMP 05/05/2020   SpO2 100%   BMI 22.40 kg/m   Physical Exam Vitals reviewed.  Constitutional:      Appearance: Normal appearance.  HENT:     Head: Normocephalic and atraumatic.  Eyes:     Extraocular Movements: Extraocular movements intact.  Cardiovascular:     Rate and Rhythm: Regular rhythm. Tachycardia present.  Pulmonary:     Effort: Pulmonary effort is normal. No respiratory distress.     Breath sounds: Normal breath sounds.  Abdominal:     General: There is no distension.     Palpations: Abdomen is soft.     Tenderness: There is no abdominal tenderness.  Musculoskeletal:        General: Normal range of motion.  Skin:    General: Skin is warm and dry.  Neurological:     General: No focal deficit present.     Mental Status: She is alert and oriented to person, place, and time.  Psychiatric:        Mood and Affect: Mood normal.        Behavior: Behavior normal.        Thought Content: Thought content normal.        Judgment: Judgment normal.      Imaging: CT ABDOMEN PELVIS WO CONTRAST  Result Date: 05/16/2020 CLINICAL DATA:  Bilious emesis, renal transplant EXAM: CT ABDOMEN AND PELVIS WITHOUT CONTRAST TECHNIQUE: Multidetector CT imaging of the abdomen and pelvis was performed following the standard protocol without IV contrast. COMPARISON:  None. FINDINGS: Lower chest: No acute abnormality. Hepatobiliary: No focal liver abnormality  is seen. No gallstones, gallbladder wall thickening, or biliary dilatation. Pancreas: Unremarkable Spleen: Unremarkable Adrenals/Urinary Tract: The adrenal glands are unremarkable. The native kidneys are markedly atrophic in keeping with changes of chronic renal failure. Previously noted right transplant kidney has been surgically removed and a residual partially calcified complex collection is seen within the resection bed likely representing a postoperative complex seroma or chronic hematoma. A new transplant kidney is seen within the left iliac fossa and is normal in size and demonstrates normal cortical thickness. There is no hydronephrosis. The bladder is unremarkable. Stomach/Bowel: Stomach, small bowel, and large bowel are unremarkable. Appendix normal. No free intraperitoneal gas or fluid. Vascular/Lymphatic: Since the prior examination, there has developed extensive pathologic adenopathy within the left periaortic, aortocaval, and retrocaval lymph node groups, as well as the bilateral common iliac, left external and bilateral inguinal lymph node groups. Bulky adenopathy is compatible with lymphoma. Index lymph node anterior to the abdominal aorta at axial image # 37/3 measures 2.4 x 4.2 cm in greatest dimension. Moderate aortoiliac atherosclerotic calcification is present without evidence of aneurysm. Reproductive: Uterus and bilateral adnexa are unremarkable. Other: Rectum unremarkable. Musculoskeletal: Bone island within the right ischium. No lytic or blastic bone lesions are seen. IMPRESSION: Interval  development of extensive retroperitoneal, pelvic, and inguinal lymphadenopathy most in keeping with lymphoma in this immunocompromised patient. Left inguinal and external iliac lymphadenopathy should be easily amenable to ultrasound-guided biopsy for further evaluation. Interval explantation of right renal transplant and implantation of new transplant kidney within the left iliac fossa. Aortic Atherosclerosis (ICD10-I70.0). Electronically Signed   By: Fidela Salisbury MD   On: 05/16/2020 05:49   CT Angio Chest PE W and/or Wo Contrast  Result Date: 05/27/2020 CLINICAL DATA:  PE suspected, high probability EXAM: CT ANGIOGRAPHY CHEST WITH CONTRAST TECHNIQUE: Multidetector CT imaging of the chest was performed using the standard protocol during bolus administration of intravenous contrast. Multiplanar CT image reconstructions and MIPs were obtained to evaluate the vascular anatomy. CONTRAST:  4mL OMNIPAQUE IOHEXOL 350 MG/ML SOLN COMPARISON:  None. FINDINGS: Cardiovascular: Satisfactory opacification of the pulmonary arteries to the segmental level. No evidence of pulmonary embolism. Normal heart size. No pericardial effusion. Mediastinum/Nodes: Retrocrural lymphadenopathy, reference abdominal CT. Lungs/Pleura: 2 mm peripheral right upper lobe pulmonary nodule, attention on follow-up. There is no edema, consolidation, effusion, or pneumothorax. Upper Abdomen: Reported on dedicated study. Musculoskeletal: No acute or aggressive finding. Review of the MIP images confirms the above findings. IMPRESSION: Negative for pulmonary embolism or other acute finding. Electronically Signed   By: Monte Fantasia M.D.   On: 05/27/2020 06:02   CT ABDOMEN PELVIS W CONTRAST  Result Date: 05/27/2020 CLINICAL DATA:  Abdominal distension.  Adenopathy. EXAM: CT ABDOMEN AND PELVIS WITH CONTRAST TECHNIQUE: Multidetector CT imaging of the abdomen and pelvis was performed using the standard protocol following bolus administration of  intravenous contrast. CONTRAST:  62mL OMNIPAQUE IOHEXOL 350 MG/ML SOLN COMPARISON:  Noncontrast abdominal CT 05/16/2020 FINDINGS: Lower chest:  No contributory findings. Hepatobiliary: Multiple ill-defined masses within the liver. One of the largest is in the subcapsular inferior right lobe on 5:33 at 2 cm.No evidence of biliary obstruction or stone. Pancreas: Unremarkable. Spleen: Unremarkable. Adrenals/Urinary Tract: Negative adrenals. Native renal atrophy. Solid masslike appearance at the left renal hilum which measures 2.2 cm. There is a transplant kidney in the left lower quadrant with scarring involving the left-sided pole. There is pelviectasis without overt hydronephrosis. Failed transplant in the right lower quadrant. Unremarkable bladder. Stomach/Bowel: No obstruction. No inflammatory bowel  wall thickening. Vascular/Lymphatic: Bulky retroperitoneal lymphadenopathy. The nodes show areas of low-density and calcification, which would be heterogeneous for untreated lymphoma. For measurement purposes a node ventral to the aorta and cava on 5:44 measures 42 x 26 mm. There is also bilateral inguinal lymphadenopathy including a left inguinal node measuring 16 mm long axis. Adenopathy continues to the retrocural station. Atherosclerotic calcification, premature for age. Reproductive:Intramural fibroids measuring up to 19 mm anteriorly. Other: No ascites or pneumoperitoneum. Musculoskeletal: No acute abnormalities. IMPRESSION: 1. Redemonstrated inguinal and intra-abdominal lymphadenopathy. With the benefit of contrast, multiple liver lesions are also seen. Findings could reflect metastatic disease, lymphoma, or posttransplant lymphoproliferative disease. Left inguinal nodes are the most amenable to excisional biopsy. 2. 2.2 cm native left lower pole renal mass which may be related to#1. 3. Left lower quadrant transplant kidney with areas of cortical scarring. No hydronephrosis or perinephric collection.  Electronically Signed   By: Monte Fantasia M.D.   On: 05/27/2020 06:16   DG Chest Port 1 View  Result Date: 05/26/2020 CLINICAL DATA:  Chest pain and vomiting EXAM: PORTABLE CHEST 1 VIEW COMPARISON:  Chest x-ray 02/03/2015. FINDINGS: The heart size and mediastinal contours are within normal limits. No focal consolidation. No pulmonary edema. No pleural effusion. No pneumothorax. No acute osseous abnormality. IMPRESSION: No active disease. Electronically Signed   By: Iven Finn M.D.   On: 05/26/2020 23:40    Labs:  CBC: Recent Labs    01/09/20 1400 01/09/20 1546 02/19/20 1620 03/27/20 1125 05/15/20 1743 05/27/20 0113  WBC  --  6.5  --   --  7.3 7.6  HGB   < > 10.5* 10.0* 9.2* 8.6* 4.0*  HCT  --  34.4*  --   --  29.0* 14.0*  PLT  --  297  --   --  487* 376   < > = values in this interval not displayed.    COAGS: No results for input(s): INR, APTT in the last 8760 hours.  BMP: Recent Labs    01/09/20 1546 05/15/20 1743 05/27/20 0113  NA 135 134* 137  K 3.7 3.6 4.4  CL 102 97* 107  CO2 24 21* 17*  GLUCOSE 98 134* 96  BUN 11 19 17   CALCIUM 9.5 9.5 8.0*  CREATININE 1.36* 1.54* 1.13*  GFRNONAA 48* 41* 59*  GFRAA 55* 47* >60    LIVER FUNCTION TESTS: Recent Labs    05/15/20 1743 05/27/20 0113  BILITOT 1.1 1.5*  AST 90* 336*  ALT 81* 105*  ALKPHOS 444* 242*  PROT 7.2 5.6*  ALBUMIN 3.0* 2.0*    TUMOR MARKERS: No results for input(s): AFPTM, CEA, CA199, CHROMGRNA in the last 8760 hours.  Assessment and Plan:  Lymphadenopathy worrisome for lymphoma.  Will proceed with image guided biopsy today by Dr. Pascal Lux (either right or left inguinal lymph node).  Risks and benefits of lymph node biopsy was discussed with the patient and/or patient's family including, but not limited to bleeding, infection, damage to adjacent structures or low yield requiring additional tests.  All of the questions were answered and there is agreement to proceed.  Consent signed and  in chart.  Thank you for this interesting consult.  I greatly enjoyed meeting KAYLINA CAHUE and look forward to participating in their care.  A copy of this report was sent to the requesting provider on this date.  Electronically Signed: Murrell Redden, PA-C   05/27/2020, 9:59 AM      I spent a total of  77 Minutes in face to face in clinical consultation, greater than 50% of which was counseling/coordinating care for lymph node biopsy.

## 2020-05-28 ENCOUNTER — Telehealth: Payer: Self-pay | Admitting: Hematology and Oncology

## 2020-05-28 DIAGNOSIS — R599 Enlarged lymph nodes, unspecified: Secondary | ICD-10-CM | POA: Diagnosis not present

## 2020-05-28 LAB — LACTIC ACID, PLASMA
Lactic Acid, Venous: 0.9 mmol/L (ref 0.5–1.9)
Lactic Acid, Venous: 0.8 mmol/L (ref 0.5–1.9)

## 2020-05-28 LAB — COMPREHENSIVE METABOLIC PANEL
ALT: 106 U/L — ABNORMAL HIGH (ref 0–44)
AST: 232 U/L — ABNORMAL HIGH (ref 15–41)
Albumin: 2.1 g/dL — ABNORMAL LOW (ref 3.5–5.0)
Alkaline Phosphatase: 265 U/L — ABNORMAL HIGH (ref 38–126)
Anion gap: 17 — ABNORMAL HIGH (ref 5–15)
BUN: 10 mg/dL (ref 6–20)
CO2: 14 mmol/L — ABNORMAL LOW (ref 22–32)
Calcium: 8.3 mg/dL — ABNORMAL LOW (ref 8.9–10.3)
Chloride: 105 mmol/L (ref 98–111)
Creatinine, Ser: 1.29 mg/dL — ABNORMAL HIGH (ref 0.44–1.00)
GFR calc Af Amer: 59 mL/min — ABNORMAL LOW (ref >60)
GFR calc non Af Amer: 51 mL/min — ABNORMAL LOW (ref >60)
Glucose, Bld: 97 mg/dL (ref 70–99)
Potassium: 4 mmol/L (ref 3.5–5.1)
Sodium: 136 mmol/L (ref 135–145)
Total Bilirubin: 2.8 mg/dL — ABNORMAL HIGH (ref 0.3–1.2)
Total Protein: 5.6 g/dL — ABNORMAL LOW (ref 6.5–8.1)

## 2020-05-28 LAB — ERYTHROPOIETIN: Erythropoietin: 20.6 m[IU]/mL — ABNORMAL HIGH (ref 2.6–18.5)

## 2020-05-28 LAB — CBC
HCT: 31.5 % — ABNORMAL LOW (ref 36.0–46.0)
Hemoglobin: 9.6 g/dL — ABNORMAL LOW (ref 12.0–15.0)
MCH: 25 pg — ABNORMAL LOW (ref 26.0–34.0)
MCHC: 30.5 g/dL (ref 30.0–36.0)
MCV: 82 fL (ref 80.0–100.0)
Platelets: 247 10*3/uL (ref 150–400)
RBC: 3.84 MIL/uL — ABNORMAL LOW (ref 3.87–5.11)
RDW: 16.6 % — ABNORMAL HIGH (ref 11.5–15.5)
WBC: 6.9 10*3/uL (ref 4.0–10.5)
nRBC: 0 % (ref 0.0–0.2)

## 2020-05-28 LAB — HAPTOGLOBIN: Haptoglobin: 279 mg/dL (ref 42–296)

## 2020-05-28 MED ORDER — MYCOPHENOLATE MOFETIL 250 MG PO CAPS
500.0000 mg | ORAL_CAPSULE | Freq: Two times a day (BID) | ORAL | Status: DC
  Administered 2020-05-28 – 2020-06-01 (×7): 500 mg via ORAL
  Filled 2020-05-28 (×9): qty 2

## 2020-05-28 MED ORDER — TACROLIMUS 1 MG PO CAPS
2.0000 mg | ORAL_CAPSULE | Freq: Two times a day (BID) | ORAL | Status: DC
  Administered 2020-05-28: 2 mg via ORAL
  Filled 2020-05-28 (×2): qty 2

## 2020-05-28 MED ORDER — ACETAMINOPHEN 500 MG PO TABS
500.0000 mg | ORAL_TABLET | Freq: Three times a day (TID) | ORAL | Status: DC | PRN
  Administered 2020-05-28: 500 mg via ORAL
  Filled 2020-05-28 (×2): qty 1

## 2020-05-28 NOTE — Telephone Encounter (Signed)
Received a new pt referral from Dr. Ardelia Mems for lymphadenopathy, possible lymphoma. Pt has been scheduled to see Dr. Lorenso Courier on 10/18 at 2pm. Ms. Maynor is currently in the hospital. Letter mailed.

## 2020-05-28 NOTE — Progress Notes (Signed)
Patient arrived to 6N10 from ED via stretcher. Report received from [place], [place]. Patient alert and oriented x4. Dressing on groin site from biopsy clean, dry, and intact.See Assessment. Pain level 6 out of 10. Awaiting PRN approval from pharmacy.  Patient call bell within reach. Will continue to monitor pt.

## 2020-05-28 NOTE — Progress Notes (Signed)
Family Medicine Teaching Service Daily Progress Note Intern Pager: [phone]  Patient name: Candace Griffin Medical record number: [id number] Date of birth: 1976/02/25 Age: 44 y.o. Gender: female  Primary Care Provider: Richarda Osmond, DO Consultants: IR, Oncology  Code Status: FULL    Pt Overview and Major Events to Date:  Admitted 10/4 Inguinal lymph node biopsy 10/4  Assessment and Plan: Candace Griffin is a 44 y.o. female presenting with CP, SOB. PMH is significant for s/p renal transplant, anemia, GERD, HTN, menorrhagia   Symptomatic anemia- On presentation, hgb 4.0 (hgb 8.6 at prior ED presentation 13 days ago). Normal platelets and WBCs. S/p 2 uPRBCs . Current Hb 9.6 from 10.6 yesterday. Pt still tachycardic. She reports tachycardia typically resolves after she is transfused. Heme/onc consulted, appreciate recs - Monitor Hb  - continuous telemetry - med onc performing additional work up to evaluate etiology- iron studies, Vit B12, EPO, haptoglobin, DAT   Lymphadenopathy- seen on abd/pelvis CT 9/23 and 10/4. Concerning for posttransplant lymphoproliferative disease vs. Lymphoma, vs. Metastatic disease. Also has multiple liver lesions and renal mass. CMET shows elevation of liver enzymes from 9/23. AST 336, ALT, 105, alk phos 242. About a 25 lb weight loss in past 2 months. Heme/onc consulted, appreciate recs - s/p US guided inguinal lymph node biopsy 10/4 - f/u biopsy results  - Dilaudid 0.89m q4prn for pain until liver function improves then can restart home Tramadol - oncology planning to check EBV PCR titers, HIV, Hep C testing   S/p renal transplant-  [facility]. Home meds include mycophenolate and tacrolimus, prednisone. Cr 1.13 on admission. Currently 1.29 (baseline since transplant ranges from 1.13-1.54)  - continue suppressants  - RFP am - monitor kidney function closely in setting of  anemia, dehydration, and starting potentially nephrotoxic medications. If worsening function, consult nephrology.    Metabolic Acidosis CO2: 14. Anion gap elevated at 17. - f/u lactic acid   N/V  Intractable. Causing dehydration. Could be related to thrush or the source of lymphadenopathy. - ODT zofran if can tolerate.   Constipation, chronic Chronic and worsening for the past 5 months. Patient endorses about a month since last full BM. Only a few pellets when straining very hard.No obstruction seen on abdominal CT  - enema - PO laxitives - suppositories PRN  Thrush immunocompromised patient with throat pain x2 weeks and white plaque on tongue and pharynx. H/o thrush. - fluconazole 101mdaily. likely 2 week tx.  - topical pain relief to avoid use of NSAIDs and tylenol - if not improving in a week or so, recommend EGD with biopsy  FEN/GI: Regular diet  PPJOI:NOMVEHM Disposition: f/u heme onc   Subjective:   Patient sleeping in bed when I came in. States she has some chest pain still that does improve when she gets medication. States that her tachycardia usually resolves after her blood transfusions so she has not had to take any medication for it.  Denies any fevers, lightheadedness or dizziness, abdominal pain.   Objective: Temp:  [97.8 F (36.6 C)-100.2 F (37.9 C)] 100.2 F (37.9 C) (10/05 0339) Pulse Rate:  [100-140] 124 (10/05 0339) Resp:  [16-33] 19 (10/05 0339) BP: (99-138)/(70-99) 123/83 (10/05 0339) SpO2:  [96 %-100 %] 99 % (10/05 0339) Physical Exam: General: Tired, NAD Cardiovascular: Tachycardic. No murmurs  Respiratory:CTAB. Normal WOB Abdomen: Distended abdomen. Tender diffusely  Extremities: Warm, dry. No edema  Derm:  Incision site from biopsy on L groin area clean and covered with band aid. No erythema or drainage.  Laboratory: Recent Labs  Lab 05/27/20 1058 05/27/20 2244 05/28/20 0128  WBC 7.9 7.6 6.9  HGB 10.9* 10.6* 9.6*  HCT 36.6 35.4*  31.5*  PLT 350 299 247   Recent Labs  Lab 05/27/20 0113 05/27/20 1058 05/28/20 0128  NA 137  --  136  K 4.4  --  4.0  CL 107  --  105  CO2 17*  --  14*  BUN 17  --  10  CREATININE 1.13* 1.19* 1.29*  CALCIUM 8.0*  --  8.3*  PROT 5.6*  --  5.6*  BILITOT 1.5*  --  2.8*  ALKPHOS 242*  --  265*  ALT 105*  --  106*  AST 336*  --  232*  GLUCOSE 96  --  97   Imaging/Diagnostic Tests:  Extensive reproperitoneal, pelvic, and inguinal lymphadenopathy, liver lesions, and renal mass   [place] CORE BIOPSY (LYMPH NODES) Result Date: 05/27/2020 tient tolerated the procedure well without immediate postprocedural complication. IMPRESSION: Technically successful ultrasound guided biopsy of dominant indeterminate left inguinal lymph node.    Shary Key, DO 05/28/2020, 4:34 AM PGY-1, [place] Intern pager: [phone], text pages welcome

## 2020-05-28 NOTE — Progress Notes (Signed)
   05/27/20 2325  Assess: MEWS Score  Temp 99.2 F (37.3 C)  BP (!) 128/94  Pulse Rate (!) 122  Resp 18  Level of Consciousness Alert  SpO2 100 %  O2 Device Room Air  Patient Activity (if Appropriate) In bed  Assess: MEWS Score  MEWS Temp 0  MEWS Systolic 0  MEWS Pulse 2  MEWS RR 0  MEWS LOC 0  MEWS Score 2  MEWS Score Color Yellow  Assess: if the MEWS score is Yellow or Red  Were vital signs taken at a resting state? Yes  Focused Assessment No change from prior assessment  Early Detection of Sepsis Score *See Row Information* Low  MEWS guidelines implemented *See Row Information* Yes  Treat  MEWS Interventions Administered prn meds/treatments  Pain Scale 0-10  Pain Score 6  Pain Type Acute pain  Pain Location Groin  Pain Orientation Left  Pain Descriptors / Indicators Aching;Throbbing  Pain Frequency Constant  Pain Onset On-going  Patients Stated Pain Goal 0  Pain Intervention(s) RN made aware;Other (Comment) (awaiting RX approval fro PRN med)  Multiple Pain Sites No  Take Vital Signs  Increase Vital Sign Frequency  Yellow: Q 2hr X 2 then Q 4hr X 2, if remains yellow, continue Q 4hrs  Escalate  MEWS: Escalate Yellow: discuss with charge nurse/RN and consider discussing with provider and RRT  Notify: Charge Nurse/RN  Name of Charge Nurse/RN Notified Josephine, RN  Date Charge Nurse/RN Notified 05/27/20  Time Charge Nurse/RN Notified 2325  Document  Progress note created (see row info) Yes

## 2020-05-29 DIAGNOSIS — N2889 Other specified disorders of kidney and ureter: Secondary | ICD-10-CM | POA: Diagnosis not present

## 2020-05-29 DIAGNOSIS — C787 Secondary malignant neoplasm of liver and intrahepatic bile duct: Secondary | ICD-10-CM | POA: Diagnosis not present

## 2020-05-29 DIAGNOSIS — D649 Anemia, unspecified: Secondary | ICD-10-CM | POA: Diagnosis not present

## 2020-05-29 DIAGNOSIS — R599 Enlarged lymph nodes, unspecified: Secondary | ICD-10-CM | POA: Diagnosis not present

## 2020-05-29 LAB — CBC
HCT: 31.3 % — ABNORMAL LOW (ref 36.0–46.0)
Hemoglobin: 9.5 g/dL — ABNORMAL LOW (ref 12.0–15.0)
MCH: 24.5 pg — ABNORMAL LOW (ref 26.0–34.0)
MCHC: 30.4 g/dL (ref 30.0–36.0)
MCV: 80.9 fL (ref 80.0–100.0)
Platelets: 235 10*3/uL (ref 150–400)
RBC: 3.87 MIL/uL (ref 3.87–5.11)
RDW: 16.5 % — ABNORMAL HIGH (ref 11.5–15.5)
WBC: 5.8 10*3/uL (ref 4.0–10.5)
nRBC: 0 % (ref 0.0–0.2)

## 2020-05-29 LAB — COMPREHENSIVE METABOLIC PANEL
ALT: 104 U/L — ABNORMAL HIGH (ref 0–44)
AST: 200 U/L — ABNORMAL HIGH (ref 15–41)
Albumin: 2.1 g/dL — ABNORMAL LOW (ref 3.5–5.0)
Alkaline Phosphatase: 334 U/L — ABNORMAL HIGH (ref 38–126)
Anion gap: 9 (ref 5–15)
BUN: 9 mg/dL (ref 6–20)
CO2: 20 mmol/L — ABNORMAL LOW (ref 22–32)
Calcium: 8.6 mg/dL — ABNORMAL LOW (ref 8.9–10.3)
Chloride: 107 mmol/L (ref 98–111)
Creatinine, Ser: 1.02 mg/dL — ABNORMAL HIGH (ref 0.44–1.00)
GFR calc non Af Amer: >60 mL/min (ref >60)
Glucose, Bld: 133 mg/dL — ABNORMAL HIGH (ref 70–99)
Potassium: 3.8 mmol/L (ref 3.5–5.1)
Sodium: 136 mmol/L (ref 135–145)
Total Bilirubin: 2.4 mg/dL — ABNORMAL HIGH (ref 0.3–1.2)
Total Protein: 5.9 g/dL — ABNORMAL LOW (ref 6.5–8.1)

## 2020-05-29 MED ORDER — TACROLIMUS ER 1 MG PO TB24
4.0000 mg | ORAL_TABLET | Freq: Every day | ORAL | Status: DC
  Administered 2020-05-29 – 2020-06-01 (×4): 4 mg via ORAL
  Filled 2020-05-29 (×5): qty 1

## 2020-05-29 MED ORDER — HYDROMORPHONE HCL 1 MG/ML IJ SOLN
0.5000 mg | INTRAMUSCULAR | Status: DC | PRN
  Administered 2020-05-29 – 2020-06-01 (×14): 0.5 mg via INTRAVENOUS
  Filled 2020-05-29 (×16): qty 1

## 2020-05-29 NOTE — Progress Notes (Addendum)
Family Medicine Teaching Service Daily Progress Note Intern Pager: [phone]  Patient name: Candace Griffin Medical record number: [id number] Date of birth: 1975-12-11 Age: 44 y.o. Gender: female  Primary Care Provider: Richarda Osmond, DO Consultants: IR, Oncology, Nephrology  Code Status: Full  Pt Overview and Major Events to Date:  Admitted 10/4  Assessment and Plan: Candace Griffin a 44 y.o.femalepresenting with CP, SOB. PMH is significant fors/p renal transplant, anemia, GERD, HTN, menorrhagia   Symptomatic anemia On presentation, hgb 4.0 (hgb 8.6 at prior ED presentation 13 days ago). Normal platelets and WBCs. S/p 2 uPRBCs . Current Hb stable at 9.5. Pt still tachycardic. She reports tachycardia typically resolves after she is transfused. Heme/onc consulted, appreciate recs - Monitor Hb  - continuous telemetry - med onc performing additional work up to evaluate etiology- iron studies, Vit B12, EPO, haptoglobin, DAT   Lymphadenopathy Seen on abd/pelvis CT 9/23 and 10/4. Concerning for posttransplant lymphoproliferative disease vs. Lymphoma, vs. Metastatic disease. Also has multiple liver lesions and renal mass. CMET shows elevation of liver enzymes from 9/23. AST 336, ALT, 105, alk phos 242. About a 25 lb weight loss in past 2 months. Of note, patient was febrile yesterday, 10/5 that resolved after 3 hours. Heme/onc consulted, appreciate recs - s/p US guided inguinal lymph node biopsy 10/4 - f/u biopsy results  - Dilaudid 0.5 mg q 4 hours prn  - oncology planning to check EBV PCR titers, HIV, Hep C testing   Stomach Pain Patient reports right sided stomach pain for the past 3 weeks. States she came to the hospital on 9/23 for it and CT showed extensive lymphadenopathy. Significantly tender to light palpation on exam  - Dilaudid 0.75m q 4 hrs. If this does not help will consider Fentanyl   [facility] and check up. Home meds include mycophenolate and tacrolimus, prednisone. Cr 1.13 on admission. Currently 1.02 - continue suppressants  - RFP am - monitor kidney function closely in setting of anemia, dehydration, and starting potentially nephrotoxic medications.  - Nephrology consulted, will see today  - Will update [facility] about status    Constipation, chronic Chronic and worsening for the past 5 months. Patient endorses about a month since last full BM. Only a few pellets when straining very hard.No obstruction seen on abdominal CT  - enema - PO laxitives - suppositories PRN  Thrush immunocompromised patient with throat pain x2 weeks and white plaque on tongue and pharynx. H/o thrush. - fluconazole 1078mdaily. likely 2 week tx.  - topical pain relief to avoid use of NSAIDs and tylenol - if not improving in a week or so, recommend EGD with biopsy  FEN/GI: Regular diet  PPJTT:[id number] Disposition: Pending Heme/Onc   Subjective:  Patient laying in bed this morning and complains of significant stomach pain. States it is constant and non radiating. She endorses having this pain for the past 3 weeks and came to the hospital on 9/22 for it. Believes it is her lymph nodes causing this. Denies nausea, vomiting. States her chest pain has improved. Does feel she is getting pain relief from her Dilaudid.   Objective: Temp:  [98 F (36.7 C)-101.3 F (38.5 C)] 99 F (37.2 C) (10/06 0408) Pulse Rate:  [99-124] 109 (10/06 0408) Resp:  [16-18] 18 (10/06 0408) BP: (118-133)/(78-91) 133/90 (10/06 0408) SpO2:  [97 %-100 %] 99 % (10/06 0408) Physical Exam: General: very pleasant,  tired appearing, in pain  Cardiovascular: RRR.   Respiratory: CTAB. Normal WOB  Abdomen: Significantly tender to palpation. Bowel sounds present  Extremities: warm ,dry  Derm: biopsy site clean. No erythema or drainage   Laboratory: Recent Labs  Lab 05/27/20 2244 05/28/20 0128  05/29/20 0106  WBC 7.6 6.9 5.8  HGB 10.6* 9.6* 9.5*  HCT 35.4* 31.5* 31.3*  PLT 299 247 235   Recent Labs  Lab 05/27/20 0113 05/27/20 0113 05/27/20 1058 05/28/20 0128 05/29/20 0106  NA 137  --   --  136 136  K 4.4  --   --  4.0 3.8  CL 107  --   --  105 107  CO2 17*  --   --  14* 20*  BUN 17  --   --  10 9  CREATININE 1.13*   < > 1.19* 1.29* 1.02*  CALCIUM 8.0*  --   --  8.3* 8.6*  PROT 5.6*  --   --  5.6* 5.9*  BILITOT 1.5*  --   --  2.8* 2.4*  ALKPHOS 242*  --   --  265* 334*  ALT 105*  --   --  106* 104*  AST 336*  --   --  232* 200*  GLUCOSE 96  --   --  97 133*   < > = values in this interval not displayed.    Imaging/Diagnostic Tests:  No new images  Shary Key, DO 05/29/2020, 7:11 AM PGY-1, [place] Intern pager: [phone], text pages welcome

## 2020-05-29 NOTE — Progress Notes (Addendum)
HEMATOLOGY-ONCOLOGY PROGRESS NOTE  SUBJECTIVE: The patient reports that she is feeling better today.  She denies abdominal pain, nausea, vomiting.  She is able to eat more.  Denies chest pain or shortness of breath.  No fevers reported.  No fevers documented over the past 24 hours.   REVIEW OF SYSTEMS:   Constitutional: Denies fevers, chills  Eyes: Denies blurriness of vision Ears, nose, mouth, throat, and face: Denies mucositis or sore throat Respiratory: Denies cough, dyspnea or wheezes Cardiovascular: Denies palpitation, chest discomfort Gastrointestinal:  Denies nausea, heartburn or change in bowel habits Skin: Denies abnormal skin rashes Lymphatics: Reports palpable inguinal lymphadenopathy Neurological:Denies numbness, tingling or new weaknesses Behavioral/Psych: Mood is stable, no new changes  Extremities: No lower extremity edema All other systems were reviewed with the patient and are negative.  I have reviewed the past medical history, past surgical history, social history and family history with the patient and they are unchanged from previous note.   PHYSICAL EXAMINATION: ECOG PERFORMANCE STATUS: 1 - Symptomatic but completely ambulatory  Vitals:   05/29/20 0113 05/29/20 0408  BP: 130/89 133/90  Pulse: (!) 104 (!) 109  Resp: 17 18  Temp: 98 F (36.7 C) 99 F (37.2 C)  SpO2: 99% 99%   Filed Weights   05/26/20 2312  Weight: 64.9 kg    Intake/Output from previous day: 10/05 0701 - 10/06 0700 In: 360 [P.O.:360] Out: -   GENERAL:alert, no distress and comfortable SKIN: skin color, texture, turgor are normal, no rashes or significant lesions LYMPH: Palpable inguinal lymphadenopathy bilaterally LUNGS: clear to auscultation and percussion with normal breathing effort HEART: regular rate & rhythm and no murmurs and no lower extremity edema ABDOMEN:abdomen soft, non-tender and normal bowel sounds NEURO: alert & oriented x 3 with fluent speech, no focal  motor/sensory deficits  LABORATORY DATA:  I have reviewed the data as listed CMP Latest Ref Rng & Units 05/29/2020 05/28/2020 05/27/2020  Glucose 70 - 99 mg/dL 133(H) 97 -  BUN 6 - 20 mg/dL 9 10 -  Creatinine 0.44 - 1.00 mg/dL 1.02(H) 1.29(H) 1.19(H)  Sodium 135 - 145 mmol/L 136 136 -  Potassium 3.5 - 5.1 mmol/L 3.8 4.0 -  Chloride 98 - 111 mmol/L 107 105 -  CO2 22 - 32 mmol/L 20(L) 14(L) -  Calcium 8.9 - 10.3 mg/dL 8.6(L) 8.3(L) -  Total Protein 6.5 - 8.1 g/dL 5.9(L) 5.6(L) -  Total Bilirubin 0.3 - 1.2 mg/dL 2.4(H) 2.8(H) -  Alkaline Phos 38 - 126 U/L 334(H) 265(H) -  AST 15 - 41 U/L 200(H) 232(H) -  ALT 0 - 44 U/L 104(H) 106(H) -    Lab Results  Component Value Date   WBC 5.8 05/29/2020   HGB 9.5 (L) 05/29/2020   HCT 31.3 (L) 05/29/2020   MCV 80.9 05/29/2020   PLT 235 05/29/2020   NEUTROABS 5.6 05/27/2020    CT ABDOMEN PELVIS WO CONTRAST  Result Date: 05/16/2020 CLINICAL DATA:  Bilious emesis, renal transplant EXAM: CT ABDOMEN AND PELVIS WITHOUT CONTRAST TECHNIQUE: Multidetector CT imaging of the abdomen and pelvis was performed following the standard protocol without IV contrast. COMPARISON:  None. FINDINGS: Lower chest: No acute abnormality. Hepatobiliary: No focal liver abnormality is seen. No gallstones, gallbladder wall thickening, or biliary dilatation. Pancreas: Unremarkable Spleen: Unremarkable Adrenals/Urinary Tract: The adrenal glands are unremarkable. The native kidneys are markedly atrophic in keeping with changes of chronic renal failure. Previously noted right transplant kidney has been surgically removed and a residual partially calcified complex collection  is seen within the resection bed likely representing a postoperative complex seroma or chronic hematoma. A new transplant kidney is seen within the left iliac fossa and is normal in size and demonstrates normal cortical thickness. There is no hydronephrosis. The bladder is unremarkable. Stomach/Bowel: Stomach, small  bowel, and large bowel are unremarkable. Appendix normal. No free intraperitoneal gas or fluid. Vascular/Lymphatic: Since the prior examination, there has developed extensive pathologic adenopathy within the left periaortic, aortocaval, and retrocaval lymph node groups, as well as the bilateral common iliac, left external and bilateral inguinal lymph node groups. Bulky adenopathy is compatible with lymphoma. Index lymph node anterior to the abdominal aorta at axial image # 37/3 measures 2.4 x 4.2 cm in greatest dimension. Moderate aortoiliac atherosclerotic calcification is present without evidence of aneurysm. Reproductive: Uterus and bilateral adnexa are unremarkable. Other: Rectum unremarkable. Musculoskeletal: Bone island within the right ischium. No lytic or blastic bone lesions are seen. IMPRESSION: Interval development of extensive retroperitoneal, pelvic, and inguinal lymphadenopathy most in keeping with lymphoma in this immunocompromised patient. Left inguinal and external iliac lymphadenopathy should be easily amenable to ultrasound-guided biopsy for further evaluation. Interval explantation of right renal transplant and implantation of new transplant kidney within the left iliac fossa. Aortic Atherosclerosis (ICD10-I70.0). Electronically Signed   By: Fidela Salisbury MD   On: 05/16/2020 05:49   CT Angio Chest PE W and/or Wo Contrast  Result Date: 05/27/2020 CLINICAL DATA:  PE suspected, high probability EXAM: CT ANGIOGRAPHY CHEST WITH CONTRAST TECHNIQUE: Multidetector CT imaging of the chest was performed using the standard protocol during bolus administration of intravenous contrast. Multiplanar CT image reconstructions and MIPs were obtained to evaluate the vascular anatomy. CONTRAST:  61m OMNIPAQUE IOHEXOL 350 MG/ML SOLN COMPARISON:  None. FINDINGS: Cardiovascular: Satisfactory opacification of the pulmonary arteries to the segmental level. No evidence of pulmonary embolism. Normal heart size. No  pericardial effusion. Mediastinum/Nodes: Retrocrural lymphadenopathy, reference abdominal CT. Lungs/Pleura: 2 mm peripheral right upper lobe pulmonary nodule, attention on follow-up. There is no edema, consolidation, effusion, or pneumothorax. Upper Abdomen: Reported on dedicated study. Musculoskeletal: No acute or aggressive finding. Review of the MIP images confirms the above findings. IMPRESSION: Negative for pulmonary embolism or other acute finding. Electronically Signed   By: JMonte FantasiaM.D.   On: 05/27/2020 06:02   CT ABDOMEN PELVIS W CONTRAST  Result Date: 05/27/2020 CLINICAL DATA:  Abdominal distension.  Adenopathy. EXAM: CT ABDOMEN AND PELVIS WITH CONTRAST TECHNIQUE: Multidetector CT imaging of the abdomen and pelvis was performed using the standard protocol following bolus administration of intravenous contrast. CONTRAST:  761mOMNIPAQUE IOHEXOL 350 MG/ML SOLN COMPARISON:  Noncontrast abdominal CT 05/16/2020 FINDINGS: Lower chest:  No contributory findings. Hepatobiliary: Multiple ill-defined masses within the liver. One of the largest is in the subcapsular inferior right lobe on 5:33 at 2 cm.No evidence of biliary obstruction or stone. Pancreas: Unremarkable. Spleen: Unremarkable. Adrenals/Urinary Tract: Negative adrenals. Native renal atrophy. Solid masslike appearance at the left renal hilum which measures 2.2 cm. There is a transplant kidney in the left lower quadrant with scarring involving the left-sided pole. There is pelviectasis without overt hydronephrosis. Failed transplant in the right lower quadrant. Unremarkable bladder. Stomach/Bowel: No obstruction. No inflammatory bowel wall thickening. Vascular/Lymphatic: Bulky retroperitoneal lymphadenopathy. The nodes show areas of low-density and calcification, which would be heterogeneous for untreated lymphoma. For measurement purposes a node ventral to the aorta and cava on 5:44 measures 42 x 26 mm. There is also bilateral inguinal  lymphadenopathy including a left inguinal node  measuring 16 mm long axis. Adenopathy continues to the retrocural station. Atherosclerotic calcification, premature for age. Reproductive:Intramural fibroids measuring up to 19 mm anteriorly. Other: No ascites or pneumoperitoneum. Musculoskeletal: No acute abnormalities. IMPRESSION: 1. Redemonstrated inguinal and intra-abdominal lymphadenopathy. With the benefit of contrast, multiple liver lesions are also seen. Findings could reflect metastatic disease, lymphoma, or posttransplant lymphoproliferative disease. Left inguinal nodes are the most amenable to excisional biopsy. 2. 2.2 cm native left lower pole renal mass which may be related to#1. 3. Left lower quadrant transplant kidney with areas of cortical scarring. No hydronephrosis or perinephric collection. Electronically Signed   By: Monte Fantasia M.D.   On: 05/27/2020 06:16   DG Chest Port 1 View  Result Date: 05/26/2020 CLINICAL DATA:  Chest pain and vomiting EXAM: PORTABLE CHEST 1 VIEW COMPARISON:  Chest x-ray 02/03/2015. FINDINGS: The heart size and mediastinal contours are within normal limits. No focal consolidation. No pulmonary edema. No pleural effusion. No pneumothorax. No acute osseous abnormality. IMPRESSION: No active disease. Electronically Signed   By: Iven Finn M.D.   On: 05/26/2020 23:40   [place] CORE BIOPSY (LYMPH NODES)  Result Date: 05/27/2020 INDICATION: History of previous renal transplantation (x2), now with bulky retroperitoneal, pelvic and bilateral inguinal lymphadenopathy as well as indeterminate liver lesions. Please from ultrasound-guided left inguinal lymph node biopsy for tissue diagnostic purposes. EXAM: ULTRASOUND-GUIDED LEFT INGUINAL LYMPH NODE BIOPSY COMPARISON:  CT of the chest, abdomen and pelvis - 05/27/2020 MEDICATIONS: None ANESTHESIA/SEDATION: Moderate (conscious) sedation was employed during this procedure. A total of Versed 1 mg and Fentanyl 50 mcg was  administered intravenously. Moderate Sedation Time: 10 minutes. The patient's level of consciousness and vital signs were monitored continuously by radiology nursing throughout the procedure under my direct supervision. COMPLICATIONS: None immediate. TECHNIQUE: Informed written consent was obtained from the patient after a discussion of the risks, benefits and alternatives to treatment. Questions regarding the procedure were encouraged and answered. Initial ultrasound scanning demonstrated an approximately 1.7 x 1.7 x 1.6 cm hypoechoic lymph node within the left groin, correlating with dominant left inguinal lymph node seen on preceding abdominal CT image 85, series 5. an ultrasound image was saved for documentation purposes. The procedure was planned. A timeout was performed prior to the initiation of the procedure. The operative was prepped and draped in the usual sterile fashion, and a sterile drape was applied covering the operative field. A timeout was performed prior to the initiation of the procedure. Local anesthesia was provided with 1% lidocaine with epinephrine. Under direct ultrasound guidance, an 18 gauge core needle device was utilized to obtain to obtain 8 core needle biopsies of the dominant indeterminate left inguinal lymph node. The samples were placed in saline and submitted to pathology. The needle was removed and hemostasis was achieved with manual compression. Post procedure scan was negative for significant hematoma. A dressing was placed. The patient tolerated the procedure well without immediate postprocedural complication. IMPRESSION: Technically successful ultrasound guided biopsy of dominant indeterminate left inguinal lymph node. Electronically Signed   By: Sandi Mariscal M.D.   On: 05/27/2020 13:59    ASSESSMENT AND PLAN: 1.  Extensive retroperitoneal, pelvic, and inguinal lymphadenopathy, liver lesions, and renal mass -05/27/2020 CT abdomen pelvis with contrast - "1. Redemonstrated  inguinal and intra-abdominal lymphadenopathy. With the benefit of contrast, multiple liver lesions are also seen. Findings could reflect metastatic disease, lymphoma, or posttransplant lymphoproliferative disease. Left inguinal nodes are the most amenable to excisional biopsy. 2. 2.2 cm native left lower pole  renal mass which may be related to#1. 3. Left lower quadrant transplant kidney with areas of cortical scarring. No hydronephrosis or perinephric collection." -Discussed findings with the patient as well as potential differentials including lymphoma, metastatic cancer, or post transplant lymphoproliferative disease. -05/27/2020 CT-guided biopsy of left inguinal lymph node.    Results discussed by phone with pathology on 05/29/2020 and preliminary results consistent with carcinoma, but staining pattern is nonspecific and more stains have been ordered and are currently pending. -I have discussed the preliminary biopsy results with the patient. -Will add on tumor markers including CEA, CA-125, CA 19.9, beta-hCG, AFP. -We will follow up on final biopsy results and tumor markers when available.  2.  Anemia -The patient's baseline hemoglobin is in the 9-10 range. -The patient has no evidence of iron deficiency, vitamin B12 deficiency, or hemolysis. -Erythropoietin level from 05/27/2020 mildly elevated at 20.6 -Transfuse PRBCs for hemoglobin less than 8.  3.  Oral candidiasis -The patient is immunocompromised and has oral candidiasis. -Agree with fluconazole 100 mg daily.  4.  Chronic glomerulonephritis-status post renal transplant at [facility] in July 2019 -Nephrology following    LOS: 2 days   Mikey Bussing, DNP, AGPCNP-BC, AOCNP 05/29/20    ADDENDUM  .Patient was Personally and independently interviewed, examined and relevant elements of the history of present illness were reviewed in details and an assessment and plan was created. All elements of the patient's history of present illness ,  assessment and plan were discussed in details with Mikey Bussing, DNP, AGPCNP-BC, AOCNP. The above documentation reflects our combined findings assessment and plan.  Component     Latest Ref Rng & Units 05/27/2020 05/29/2020  Iron     28 - 170 ug/dL 27 (L)   TIBC     250 - 450 ug/dL 179 (L)   Saturation Ratios     10.4 - 31.8 % 15   UIBC     ug/dL 152   Ferritin     11 - 307 ng/mL 6,152 (H)   Vitamin B12     180 - 914 pg/mL 462   Haptoglobin     42 - 296 mg/dL 279   CRP     <1.0 mg/dL 13.3 (H)   Sed Rate     0 - 22 mm/hr 71 (H)   Hepatitis C Vrs RNA by PCR-Qual     Negative Negative   CEA     0.0 - 4.7 ng/mL  0.9  AFP, Serum, Tumor Marker     0.0 - 8.3 ng/mL  1.7  CA 19-9     0 - 35 U/mL  24  Cancer Antigen (CA) 125     0.0 - 38.1 U/mL  20.1  hCG Quant     mIU/mL  3   SURGICAL PATHOLOGY  CASE: [id number]  PATIENT: Candace Griffin  Surgical Pathology Report   Clinical History: History of renal transplants, now with bulky LAN  worrisome for lymphoma (cm)  FINAL MICROSCOPIC DIAGNOSIS:   A. LYMPH NODE, LEFT INGUINAL, NEEDLE CORE BIOPSY:  - Metastatic carcinoma.   COMMENT:   Immunohistochemistry for PAX 8 is positive. P53 demonstrates  nonspecific staining. TTF-1, p40, CDX-2, GATA-3, ER, WT-1 and CA-IX are  negative. The morphology and immunophenotype are nonspecific. The  differential diagnosis includes gynecologic and renal tubular origin,  among others. Clinical and radiologic correlation are recommended.  Preliminary results reported to The Office of Dr. Irene Limbo on 05/29/2020.  Dr. Tresa Moore concurs with the diagnosis of malignancy.  Final Diagnosis performed by Gillie Manners, MD.  Electronically  signed 05/30/2020   CT abd 05/27/2020: IMPRESSION: 1. Redemonstrated inguinal and intra-abdominal lymphadenopathy. With the benefit of contrast, multiple liver lesions are also seen. Findings could reflect metastatic disease, lymphoma, or posttransplant  lymphoproliferative disease. Left inguinal nodes are the most amenable to excisional biopsy. 2. 2.2 cm native left lower pole renal mass which may be related to#1. 3. Left lower quadrant transplant kidney with areas of cortical scarring. No hydronephrosis or perinephric collection.   Electronically Signed   By: Monte Fantasia M.D.   On: 05/27/2020 06:16   Assessment  44 year old with history of renal transplantation x2.  Second transplant at the [facility] in 2019 on chronic immunosuppression with prednisone and CellCept and tacrolimus presenting with  1) metastatic carcinoma of unknown origin.  With extensive lymphadenopathy in the abdomen retroperitoneum, bilateral inguinal lymph nodes, 2.2 cm lesion in the native kidney and extensive liver metastases.  2) symptomatic severe anemia likely due to metastatic malignancy, anemia of chronic kidney disease and inflammation.  3) abnormal liver function tests likely due to extensive liver metastases  4) chronic immunosuppression status post kidney transplant  5) chronic glomerulonephritis status post renal transplant x2 failed transplanted kidney in the right lower quadrant.  Last transplant at [facility] in 2009.  6) left native kidney 2.2 cm lower pole kidney mass ?  Renal cell carcinoma.  Plan -Evaluated all the current labs including tumor markers which are unrevealing. -Biopsy of the left inguinal lymph node is consistent with metastatic carcinoma with no definitive histology and nonspecific immunohistochemical markers. POssible differential renal tubular origin of malignancy vs gyn primary. I discussed these results with Dr Canacci/Pathology -the morphology is not clear cell renal cell carcinoma.  She did feel there was adequate tissue sampling of the lymph node and did not believe additional sampling of metastatic lesions would add more information.  She recommended consideration of biopsy of  possible primary left native kidney lesion to definitively determine primary. -Discussed with pathology to also consider sending out tissue for gene analysis. -We will recommend consideration of IR consultation for possible biopsy of left renal lesion. -Ultrasound pelvis to evaluate her uterus and other adnexal structures to rule out GYN primary. -The ability to definitively determine renal primary will allow [place] to confidently use more targeted therapies including TKI's such as cabozantinib plus or minus immunotherapy (though immunotherapy might be relatively contraindicated in the setting of her kidney transplant and need for immune suppression).  Alternatively we might have to treat this as metastatic carcinoma of unknown origin with a broad chemotherapy regimen. -Her abnormal liver function test certainly could become rapidly limiting to any form of treatment. -Goals of care discussions done with patient -Transfuse as needed to maintain hemoglobin more than 8. -Nephrology following to manage post transplant cares.  Would recommend weaning down immune suppression as reasonably possible as immunosuppression would be a significant driver for her malignancy. -Her post transplant setting, immunosuppression and likely acquired cystic change in the native kidney could be risk factors for renal cell cancer as primary disease and her immune suppression could also lead to unusual presentation of metastatic renal cell. -Oncology will continue to follow.  Sullivan Lone MD [place] AAHIVMS [facility] Hematology/Oncology Physician [facility]  (Office):        [phone] (Work cell): [phone] (Fax):            [phone]   Sullivan Lone MD [place]

## 2020-05-29 NOTE — Consult Note (Signed)
[place]  Reason for Consultation: s/p renal transplant, acidosis Requesting Provider: Dr. Ardelia Mems  HPI: Candace Griffin is an 44 y.o. female s/p renal transplant x 2, most recently 2019, HTN, GERD who is currently admitted with symptomatic anemia and adenopathy and nephrology is requested to consult regarding acidosis and renal transplant.   Presented 10/4 with CP and fatigue, Hb 4.  Rec'd 2u pRBC and improved to 10s and now 9s. WBC and plt fine. CTA neg for PE, CT A/P with retroperitoneal and inguinal adenopathy and liver lesions (was also seen on 05/16/20 CT).  She underwent inguinal LN biopsy 10/4.  Heme onc is following.   She was febrile yesterday --> blood cultures pending, UA ok, imaging ok other than adenopathy.  Abx are on hold given thought this is noninfectious fever.   She is currently feeling ok - much improved after blood and fluids.  Keep meds down but for past few weeks hasn't always been able to keep them down - [facility] transplant aware.  No allograft tenderness or dysuria.  No edema.   PMH: Past Medical History:  Diagnosis Date  . Chronic glomerulonephritis   . Dialysis patient ([place])   . End stage renal disease ([place])   . GERD (gastroesophageal reflux disease)   . History of renal transplant 2011/10/14  . Hypertension    PSH: Past Surgical History:  Procedure Laterality Date  . COLPOSCOPY  2016  . diaylsis shunt Right    arm  . KIDNEY TRANSPLANT  October 14, 2011   deceased donor kidney     Past Medical History:  Diagnosis Date  . Chronic glomerulonephritis   . Dialysis patient ([place])   . End stage renal disease ([place])   . GERD (gastroesophageal reflux disease)   . History of renal transplant 10/14/11  . Hypertension     Medications:  I have reviewed the patient's current medications.  Medications Prior to Admission  Medication Sig Dispense Refill  . cholecalciferol (VITAMIN D3) 25 MCG (1000 UNIT) tablet Take 1,000 Units  by mouth daily.    . mycophenolate (CELLCEPT) 250 MG capsule Take 500 mg by mouth 2 (two) times daily.     Marland Kitchen omeprazole (PRILOSEC) 20 MG capsule Take 20 mg by mouth 2 (two) times daily as needed (for heart burn).     . ondansetron (ZOFRAN) 4 MG tablet Take 1 tablet (4 mg total) by mouth every 6 (six) hours as needed for nausea or vomiting. 30 tablet 0  . pravastatin (PRAVACHOL) 20 MG tablet Take 20 mg by mouth at bedtime.     . predniSONE (DELTASONE) 5 MG tablet Take 5 mg by mouth daily with breakfast.     . senna (SENOKOT) 8.6 MG TABS tablet Take 1 tablet (8.6 mg total) by mouth daily as needed for moderate constipation. 90 tablet 0  . Tacrolimus ER (ENVARSUS XR) 1 MG TB24 Take 4 mg by mouth daily.     . traMADol (ULTRAM) 50 MG tablet Take 1 tablet (50 mg total) by mouth every 6 (six) hours as needed. (Patient taking differently: Take 50 mg by mouth every 6 (six) hours as needed for moderate pain. ) 15 tablet 0    ALLERGIES:   Allergies  Allergen Reactions  . Lisinopril Cough  . Other Rash    Causes welts also  . Tape Rash and Other (See Comments)    Causes welts also    FAM HX: Family History  Problem Relation Age of Onset  .  Diabetes Father   . Diabetes Paternal Aunt   . Diabetes Paternal Grandmother   . Alcohol abuse Paternal Grandmother   . Alcohol abuse Paternal Uncle   . Cancer Maternal Grandmother        breast  . Breast cancer Maternal Grandmother   . Alcohol abuse Paternal Grandfather     Social History:   reports that she has never smoked. She has never used smokeless tobacco. She reports that she does not drink alcohol and does not use drugs.  ROS: 20lb wt loss since ~71mo due to N/V, poor appetite. No f/c/night sweats.   Blood pressure 133/90, pulse (!) 109, temperature 99 F (37.2 C), temperature source Oral, resp. rate 18, height 5\' 7"  (1.702 m), weight 64.9 kg, last menstrual period 05/05/2020, SpO2 99 %. PHYSICAL EXAM: Gen: comfortable in bed  Eyes:  anicteric ENT:MMM Neck: supple, no JVD CV:  Tachycardic and regular Abd: soft, RLQ functioning transplant nontender Lungs: normal WOB, clear GU: no foley Extr:  No edema Neuro: nonfocal Skin: no rashes   Results for orders placed or performed during the hospital encounter of 05/26/20 (from the past 48 hour(s))  Ferritin     Status: Abnormal   Collection Time: 05/27/20  2:07 PM  Result Value Ref Range   Ferritin 6,152 (H) 11 - 307 ng/mL    Comment: Performed at [facility], [place] [address] [place], [place] [address]  Iron and TIBC     Status: Abnormal   Collection Time: 05/27/20  2:07 PM  Result Value Ref Range   Iron 27 (L) 28 - 170 ug/dL   TIBC 179 (L) 250 - 450 ug/dL   Saturation Ratios 15 10.4 - 31.8 %   UIBC 152 ug/dL    Comment: Performed at [facility], [place] [address] [place], [place] [address]  Vitamin B12     Status: None   Collection Time: 05/27/20  2:07 PM  Result Value Ref Range   Vitamin B-12 462 180 - 914 pg/mL    Comment: (NOTE) This assay is not validated for testing neonatal or myeloproliferative syndrome specimens for Vitamin B12 levels. Performed at [facility], [place] [address] [place], [place] [address]   Haptoglobin     Status: None   Collection Time: 05/27/20  2:07 PM  Result Value Ref Range   Haptoglobin 279 42 - 296 mg/dL    Comment: (NOTE) Performed At: [facility] [place], [place] [id number] Rush Farmer MD [id number]   C-reactive protein     Status: Abnormal   Collection Time: 05/27/20  2:07 PM  Result Value Ref Range   CRP 13.3 (H) <1.0 mg/dL    Comment: Performed at [facility], [place] [address], [place], [place] [address]  Sedimentation rate     Status: Abnormal   Collection Time: 05/27/20  2:07 PM  Result Value Ref Range   Sed Rate 71 (H) 0 - 22 mm/hr    Comment: Performed at [place] [address], [place], [place] [address]  Erythropoietin     Status: Abnormal    Collection Time: 05/27/20  2:07 PM  Result Value Ref Range   Erythropoietin 20.6 (H) 2.6 - 18.5 mIU/mL    Comment: (NOTE) [employer] UniCel DxI 800 Immunoassay System Values obtained with different assay methods or kits cannot be used interchangeably. Results cannot be interpreted as absolute evidence of the presence or absence of malignant disease. Performed At:  [place], [place] [id number] Rush Farmer MD [id number]   Direct antiglobulin test     Status: None   Collection Time: 05/27/20  2:09 PM  Result Value Ref Range   DAT, complement NEG    DAT, IgG      NEG Performed at [facility], [place] [address] [place], [place] [address]   Hepatitis B surface antigen     Status: None   Collection Time: 05/27/20  2:55 PM  Result Value Ref Range   Hepatitis B Surface Ag NON REACTIVE NON REACTIVE    Comment: Performed at [facility] [address], [place], [place] [address]  Hepatitis B core antibody, total     Status: None   Collection Time: 05/27/20  2:55 PM  Result Value Ref Range   Hep B Core Total Ab NON REACTIVE NON REACTIVE    Comment: Performed at [place] [address] [place], [place] [address]  Reticulocytes     Status: None   Collection Time: 05/27/20  2:55 PM  Result Value Ref Range   Retic Ct Pct 1.4 0.4 - 3.1 %   RBC. 3.94 3.87 - 5.11 MIL/uL   Retic Count, Absolute 54.4 19.0 - 186.0 K/uL   Immature Retic Fract 12.7 2.3 - 15.9 %    Comment: Performed at [facility] [address], [place], [place] [address]  CBC     Status: Abnormal   Collection Time: 05/27/20 10:44 PM  Result Value Ref Range   WBC 7.6 4.0 - 10.5 K/uL   RBC 4.27 3.87 - 5.11 MIL/uL   Hemoglobin 10.6 (L) 12.0 - 15.0 g/dL   HCT 35.4 (L) 36 - 46 %   MCV 82.9 80.0 - 100.0 fL   MCH 24.8 (L) 26.0 - 34.0 pg   MCHC 29.9 (L) 30.0 - 36.0 g/dL   RDW 16.7 (H) 11.5 - 15.5 %   Platelets 299 150 - 400 K/uL   nRBC 0.0 0.0 - 0.2 %     Comment: Performed at [place] [address] [place], [place] [address]  Comprehensive metabolic panel     Status: Abnormal   Collection Time: 05/28/20  1:28 AM  Result Value Ref Range   Sodium 136 135 - 145 mmol/L   Potassium 4.0 3.5 - 5.1 mmol/L   Chloride 105 98 - 111 mmol/L   CO2 14 (L) 22 - 32 mmol/L   Glucose, Bld 97 70 - 99 mg/dL    Comment: Glucose reference range applies only to samples taken after fasting for at least 8 hours.   BUN 10 6 - 20 mg/dL   Creatinine, Ser 1.29 (H) 0.44 - 1.00 mg/dL   Calcium 8.3 (L) 8.9 - 10.3 mg/dL   Total Protein 5.6 (L) 6.5 - 8.1 g/dL   Albumin 2.1 (L) 3.5 - 5.0 g/dL   AST 232 (H) 15 - 41 U/L   ALT 106 (H) 0 - 44 U/L   Alkaline Phosphatase 265 (H) 38 - 126 U/L   Total Bilirubin 2.8 (H) 0.3 - 1.2 mg/dL   GFR calc non Af Amer 51 (L) >60 mL/min   GFR calc Af Amer 59 (L) >60 mL/min   Anion gap 17 (H) 5 - 15    Comment: Performed at [facility], [place] [address] [place], [place] [address]  CBC     Status: Abnormal   Collection Time: 05/28/20  1:28  AM  Result Value Ref Range   WBC 6.9 4.0 - 10.5 K/uL   RBC 3.84 (L) 3.87 - 5.11 MIL/uL   Hemoglobin 9.6 (L) 12.0 - 15.0 g/dL   HCT 31.5 (L) 36 - 46 %   MCV 82.0 80.0 - 100.0 fL   MCH 25.0 (L) 26.0 - 34.0 pg   MCHC 30.5 30.0 - 36.0 g/dL   RDW 16.6 (H) 11.5 - 15.5 %   Platelets 247 150 - 400 K/uL   nRBC 0.0 0.0 - 0.2 %    Comment: Performed at [facility] [address], [place], [place] [address]  Lactic acid, plasma     Status: None   Collection Time: 05/28/20 12:43 PM  Result Value Ref Range   Lactic Acid, Venous 0.9 0.5 - 1.9 mmol/L    Comment: Performed at [facility] [address], [place], [place] [address]  Culture, blood (routine x 2)     Status: None (Preliminary result)   Collection Time: 05/28/20 12:43 PM   Specimen: BLOOD LEFT HAND  Result Value Ref Range   Specimen Description BLOOD LEFT HAND    Special Requests      BOTTLES DRAWN AEROBIC AND  ANAEROBIC Blood Culture adequate volume   Culture      NO GROWTH < 24 HOURS Performed at [facility], [place] [address], [place], [place] [address]    Report Status PENDING   Culture, blood (routine x 2)     Status: None (Preliminary result)   Collection Time: 05/28/20 12:54 PM   Specimen: BLOOD LEFT ARM  Result Value Ref Range   Specimen Description BLOOD LEFT ARM    Special Requests      BOTTLES DRAWN AEROBIC AND ANAEROBIC Blood Culture adequate volume   Culture      NO GROWTH < 24 HOURS Performed at [facility], [place] [address] [place], [place] [address]    Report Status PENDING   Lactic acid, plasma     Status: None   Collection Time: 05/28/20  3:56 PM  Result Value Ref Range   Lactic Acid, Venous 0.8 0.5 - 1.9 mmol/L    Comment: Performed at [facility] [address], [place], [place] [address]  Comprehensive metabolic panel     Status: Abnormal   Collection Time: 05/29/20  1:06 AM  Result Value Ref Range   Sodium 136 135 - 145 mmol/L   Potassium 3.8 3.5 - 5.1 mmol/L   Chloride 107 98 - 111 mmol/L   CO2 20 (L) 22 - 32 mmol/L   Glucose, Bld 133 (H) 70 - 99 mg/dL    Comment: Glucose reference range applies only to samples taken after fasting for at least 8 hours.   BUN 9 6 - 20 mg/dL   Creatinine, Ser 1.02 (H) 0.44 - 1.00 mg/dL   Calcium 8.6 (L) 8.9 - 10.3 mg/dL   Total Protein 5.9 (L) 6.5 - 8.1 g/dL   Albumin 2.1 (L) 3.5 - 5.0 g/dL   AST 200 (H) 15 - 41 U/L   ALT 104 (H) 0 - 44 U/L   Alkaline Phosphatase 334 (H) 38 - 126 U/L   Total Bilirubin 2.4 (H) 0.3 - 1.2 mg/dL   GFR calc non Af Amer >60 >60 mL/min   Anion gap 9 5 - 15    Comment: Performed at [facility] [address] [place], [place] [address]  CBC  Status: Abnormal   Collection Time: 05/29/20  1:06 AM  Result Value Ref Range   WBC 5.8 4.0 - 10.5 K/uL   RBC 3.87 3.87 - 5.11 MIL/uL   Hemoglobin 9.5 (L) 12.0 - 15.0 g/dL   HCT 31.3 (L) 36 - 46 %   MCV 80.9 80.0 - 100.0 fL    MCH 24.5 (L) 26.0 - 34.0 pg   MCHC 30.4 30.0 - 36.0 g/dL   RDW 16.5 (H) 11.5 - 15.5 %   Platelets 235 150 - 400 K/uL   nRBC 0.0 0.0 - 0.2 %    Comment: Performed at [facility] [address] [place], [place] [address]    [place] CORE BIOPSY (LYMPH NODES)  Result Date: 05/27/2020 INDICATION: History of previous renal transplantation (x2), now with bulky retroperitoneal, pelvic and bilateral inguinal lymphadenopathy as well as indeterminate liver lesions. Please from ultrasound-guided left inguinal lymph node biopsy for tissue diagnostic purposes. EXAM: ULTRASOUND-GUIDED LEFT INGUINAL LYMPH NODE BIOPSY COMPARISON:  CT of the chest, abdomen and pelvis - 05/27/2020 MEDICATIONS: None ANESTHESIA/SEDATION: Moderate (conscious) sedation was employed during this procedure. A total of Versed 1 mg and Fentanyl 50 mcg was administered intravenously. Moderate Sedation Time: 10 minutes. The patient's level of consciousness and vital signs were monitored continuously by radiology nursing throughout the procedure under my direct supervision. COMPLICATIONS: None immediate. TECHNIQUE: Informed written consent was obtained from the patient after a discussion of the risks, benefits and alternatives to treatment. Questions regarding the procedure were encouraged and answered. Initial ultrasound scanning demonstrated an approximately 1.7 x 1.7 x 1.6 cm hypoechoic lymph node within the left groin, correlating with dominant left inguinal lymph node seen on preceding abdominal CT image 85, series 5. an ultrasound image was saved for documentation purposes. The procedure was planned. A timeout was performed prior to the initiation of the procedure. The operative was prepped and draped in the usual sterile fashion, and a sterile drape was applied covering the operative field. A timeout was performed prior to the initiation of the procedure. Local anesthesia was provided with 1% lidocaine with epinephrine. Under direct  ultrasound guidance, an 18 gauge core needle device was utilized to obtain to obtain 8 core needle biopsies of the dominant indeterminate left inguinal lymph node. The samples were placed in saline and submitted to pathology. The needle was removed and hemostasis was achieved with manual compression. Post procedure scan was negative for significant hematoma. A dressing was placed. The patient tolerated the procedure well without immediate postprocedural complication. IMPRESSION: Technically successful ultrasound guided biopsy of dominant indeterminate left inguinal lymph node. Electronically Signed   By: Sandi Mariscal M.D.   On: 05/27/2020 13:59    Assessment/Plan **Adenopathy and liver lesions:  PTLD, lymphoma, metastatic RCCa; biopsy pending.  EBV PCR pending. Tumor markers pending. Heme/onc following  **s/p renal transplant:  2nd transplant at [facility] 2019 --> renal function with mild AKI on admission now back to baseline. Volume depletion, anemia and contrast all likely playing role in AKI.   UA bland.  On home immunosuppression with cellcept 500 BID,pred 5 daily and Envarsus 4 daily.  Will check prograf trough - dosed 12:30p daily.  Ok to continue IVF today but still tolerating po intake tomorrow could hold. Await EBV and LN biopsy results --> if PTLD will d/w transplant at [facility] re: reducing immunosuppression.   **Anemia:  Improved s/p 2u pRBC; has h/o menorrhagia.  Ferritin is 6100, total iron 27, iron sat 15%; MCV low normal.  Per hematology.   **AGMA: transiently noted yesterday, lactate normal; note ketones in UA day prior, pt with mild AKI at that time --> suspect it was a combo of those factors.  AG normalized today and serum bicarb up to 20 c/w NAGMA likely related to NS infusion.  Prograf can also cause type 4 RTA and this could be contributory. Cont to monitor and if stays low can supplement.   Will follow - page with questions, concerns.   Justin Mend 05/29/2020, 11:24 AM

## 2020-05-30 ENCOUNTER — Inpatient Hospital Stay (HOSPITAL_COMMUNITY): Payer: Medicare Other

## 2020-05-30 DIAGNOSIS — N2889 Other specified disorders of kidney and ureter: Secondary | ICD-10-CM | POA: Diagnosis not present

## 2020-05-30 DIAGNOSIS — R599 Enlarged lymph nodes, unspecified: Secondary | ICD-10-CM | POA: Diagnosis not present

## 2020-05-30 DIAGNOSIS — Z940 Kidney transplant status: Secondary | ICD-10-CM

## 2020-05-30 DIAGNOSIS — C787 Secondary malignant neoplasm of liver and intrahepatic bile duct: Secondary | ICD-10-CM | POA: Diagnosis not present

## 2020-05-30 LAB — COMPREHENSIVE METABOLIC PANEL
ALT: 96 U/L — ABNORMAL HIGH (ref 0–44)
AST: 144 U/L — ABNORMAL HIGH (ref 15–41)
Albumin: 2 g/dL — ABNORMAL LOW (ref 3.5–5.0)
Alkaline Phosphatase: 304 U/L — ABNORMAL HIGH (ref 38–126)
Anion gap: 7 (ref 5–15)
BUN: 6 mg/dL (ref 6–20)
CO2: 21 mmol/L — ABNORMAL LOW (ref 22–32)
Calcium: 8.6 mg/dL — ABNORMAL LOW (ref 8.9–10.3)
Chloride: 110 mmol/L (ref 98–111)
Creatinine, Ser: 0.99 mg/dL (ref 0.44–1.00)
GFR calc non Af Amer: >60 mL/min (ref >60)
Glucose, Bld: 123 mg/dL — ABNORMAL HIGH (ref 70–99)
Potassium: 4.1 mmol/L (ref 3.5–5.1)
Sodium: 138 mmol/L (ref 135–145)
Total Bilirubin: 2 mg/dL — ABNORMAL HIGH (ref 0.3–1.2)
Total Protein: 5.5 g/dL — ABNORMAL LOW (ref 6.5–8.1)

## 2020-05-30 LAB — HEPATITIS C VRS RNA DETECT BY PCR-QUAL: Hepatitis C Vrs RNA by PCR-Qual: NEGATIVE

## 2020-05-30 LAB — CBC
HCT: 29.6 % — ABNORMAL LOW (ref 36.0–46.0)
Hemoglobin: 9.1 g/dL — ABNORMAL LOW (ref 12.0–15.0)
MCH: 24.8 pg — ABNORMAL LOW (ref 26.0–34.0)
MCHC: 30.7 g/dL (ref 30.0–36.0)
MCV: 80.7 fL (ref 80.0–100.0)
Platelets: 225 10*3/uL (ref 150–400)
RBC: 3.67 MIL/uL — ABNORMAL LOW (ref 3.87–5.11)
RDW: 16.4 % — ABNORMAL HIGH (ref 11.5–15.5)
WBC: 5.7 10*3/uL (ref 4.0–10.5)
nRBC: 0 % (ref 0.0–0.2)

## 2020-05-30 LAB — EPSTEIN BARR VRS(EBV DNA BY PCR)
EBV DNA QN by PCR: NEGATIVE copies/mL
log10 EBV DNA Qn PCR: UNDETERMINED log10 copy/mL

## 2020-05-30 LAB — CEA: CEA: 0.9 ng/mL (ref 0.0–4.7)

## 2020-05-30 LAB — AFP TUMOR MARKER: AFP, Serum, Tumor Marker: 1.7 ng/mL (ref 0.0–8.3)

## 2020-05-30 LAB — SURGICAL PATHOLOGY

## 2020-05-30 LAB — CA 125: Cancer Antigen (CA) 125: 20.1 U/mL (ref 0.0–38.1)

## 2020-05-30 LAB — CANCER ANTIGEN 19-9: CA 19-9: 24 U/mL (ref 0–35)

## 2020-05-30 LAB — BETA HCG QUANT (REF LAB): hCG Quant: 3 m[IU]/mL

## 2020-05-30 IMAGING — US US PELVIS COMPLETE WITH TRANSVAGINAL
1 series · 13 of 25 positions shown · non-contrast
Comparison: CT [DATE]

CLINICAL DATA: Metastatic cancer unknown primary

EXAM:
TRANSABDOMINAL AND TRANSVAGINAL ULTRASOUND OF PELVIS
TECHNIQUE: Both transabdominal and transvaginal ultrasound examinations of the
pelvis were performed. Transabdominal technique was performed for
global imaging of the pelvis including uterus, ovaries, adnexal
regions, and pelvic cul-de-sac. It was necessary to proceed with
endovaginal exam following the transabdominal exam to visualize the
uterus endometrium ovaries.

[Series 1: us pelvic complete with transvaginal · 13 of 55 slices shown]
[im 1/55]
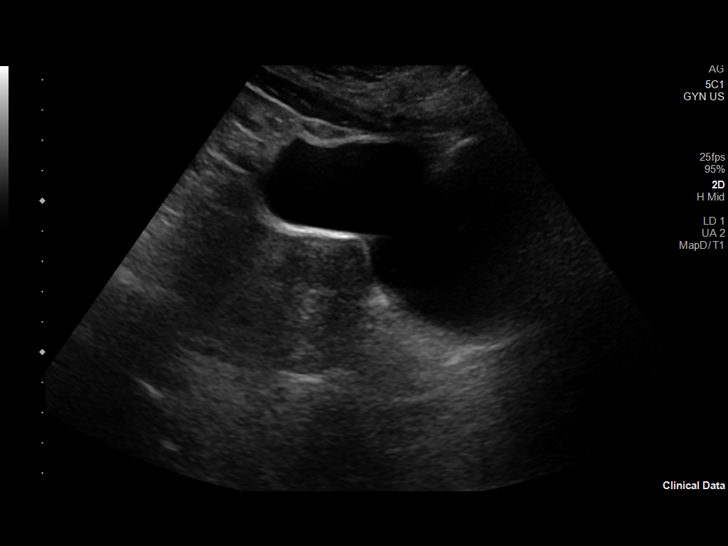
[im 5/55]
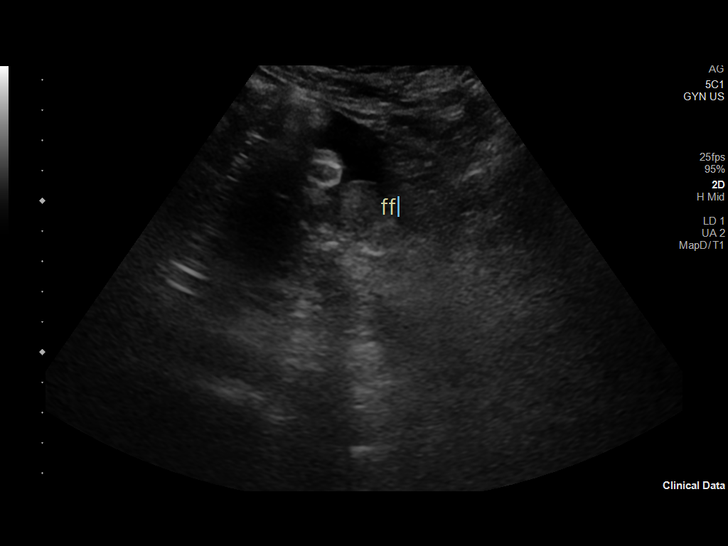
[im 10/55]
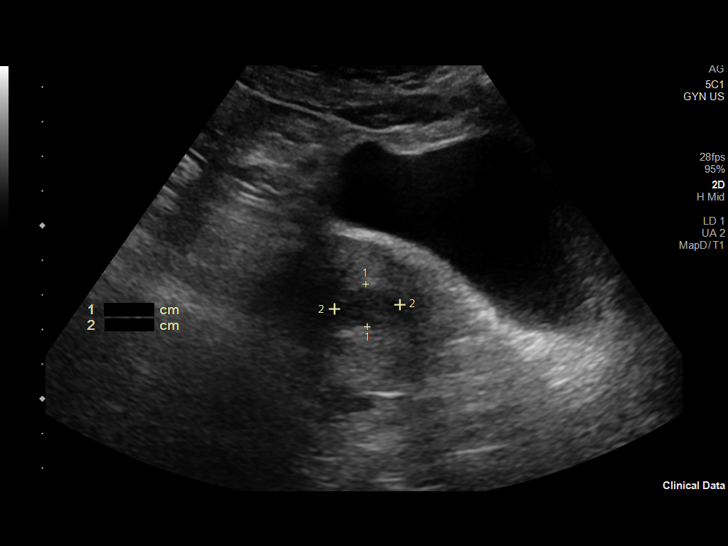
[im 14/55]
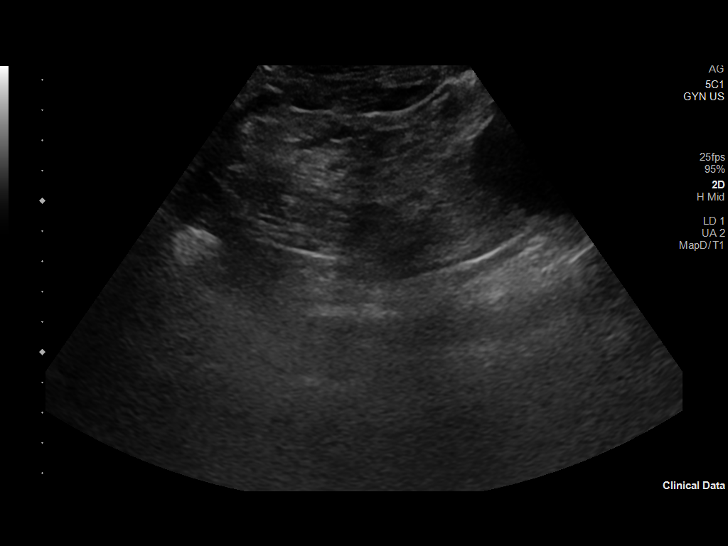
[im 19/55]
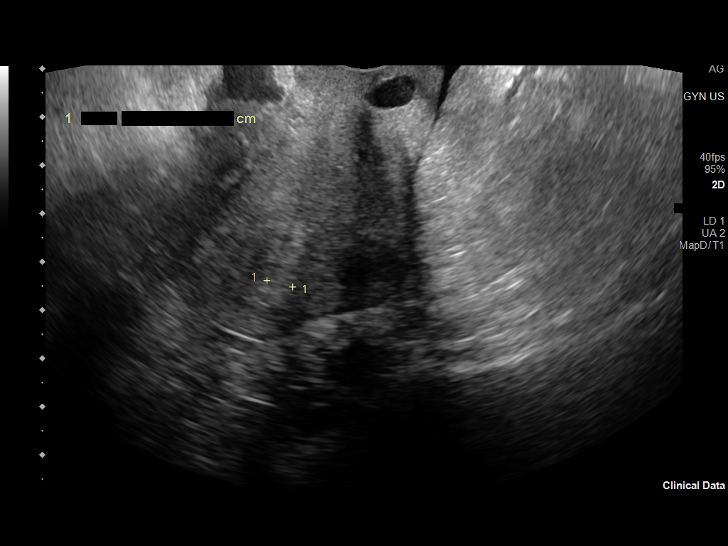
[im 23/55]
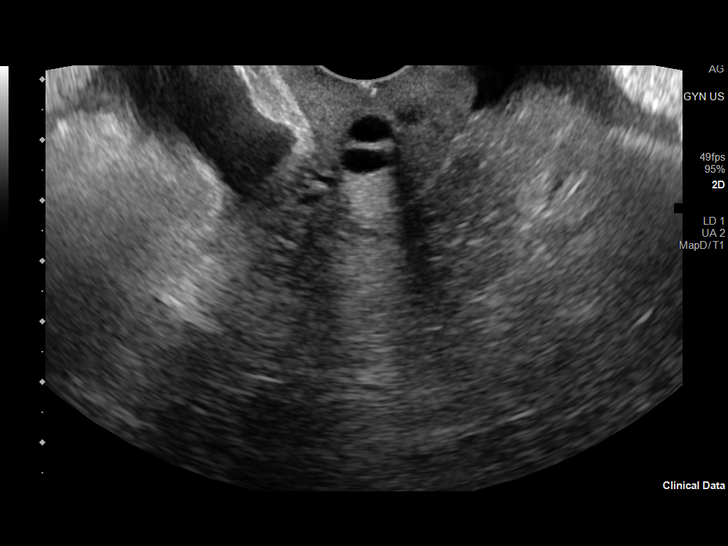
[im 28/55]
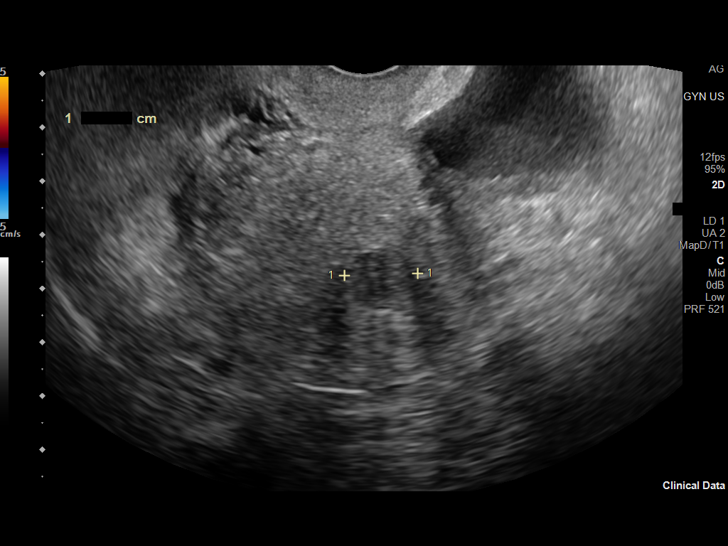
[im 32/55]
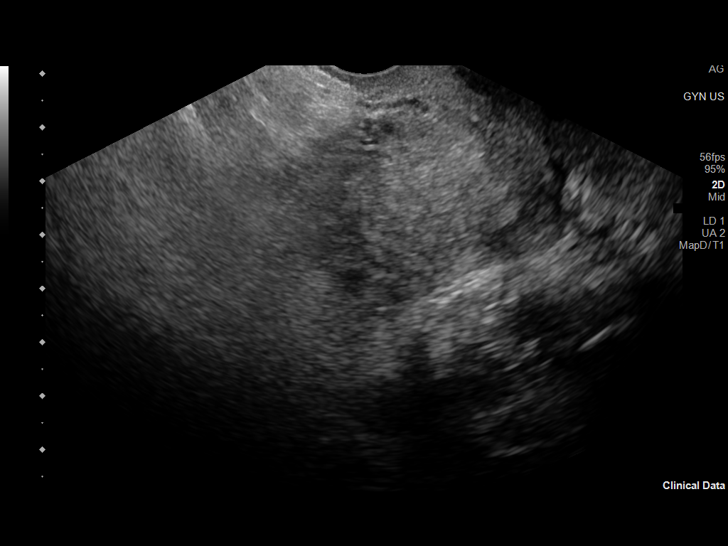
[im 37/55]
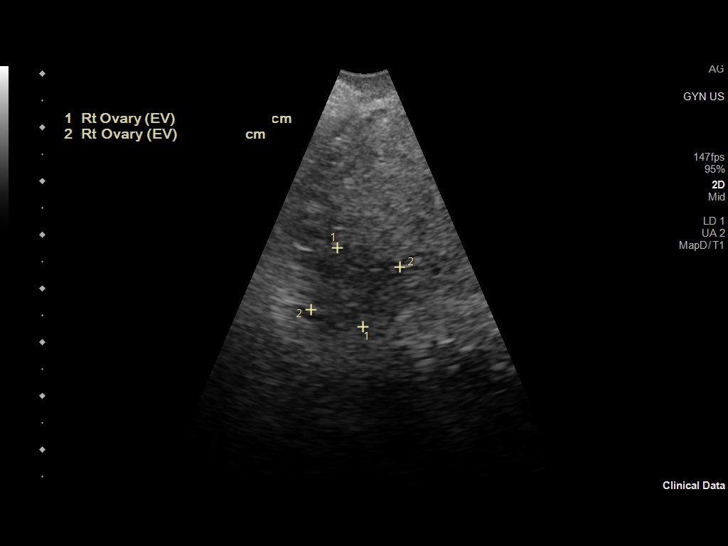
[im 41/55]
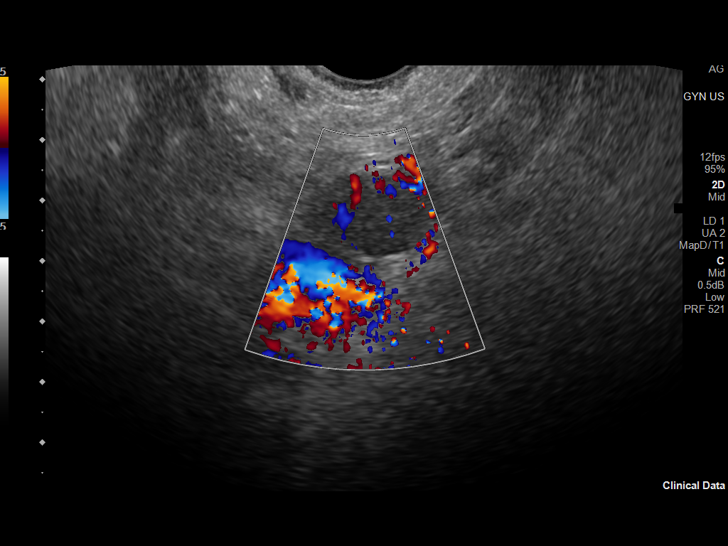
[im 46/55]
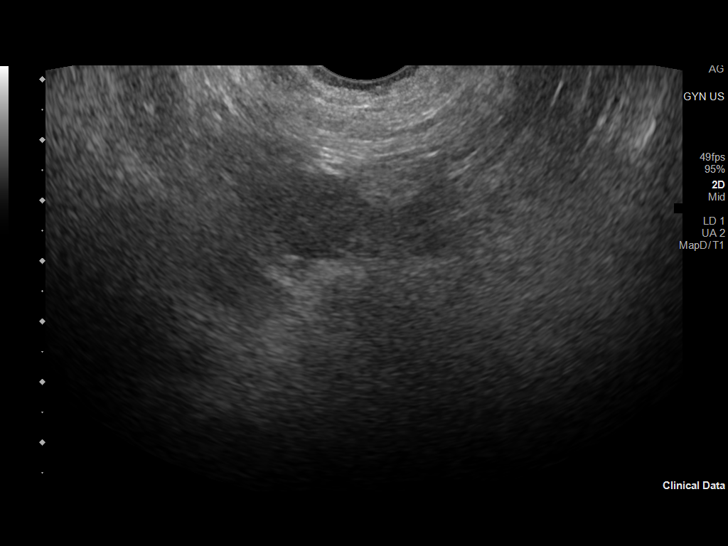
[im 50/55]
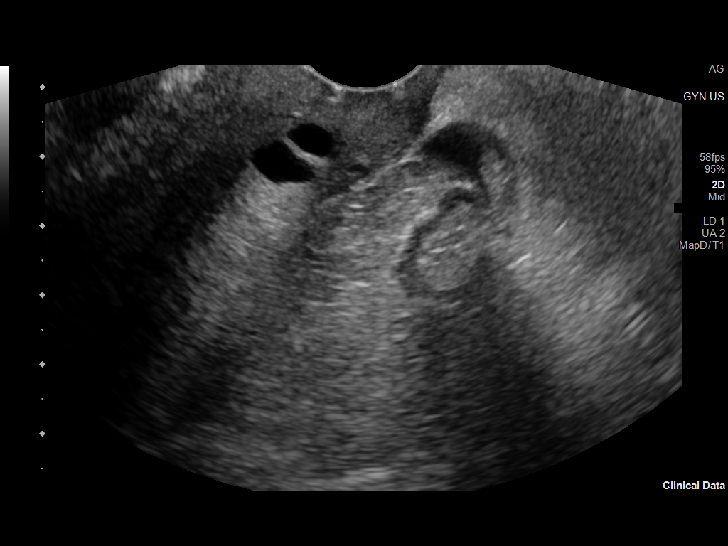
[im 55/55]
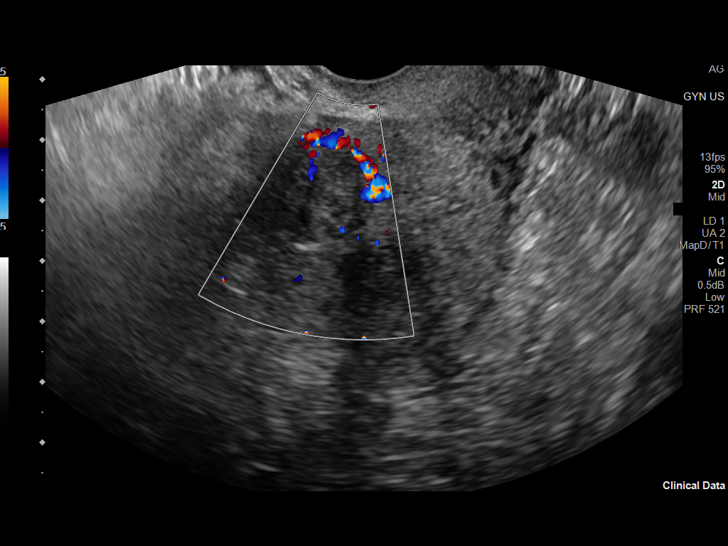

[13 of 25 positions shown; findings below may reference images not displayed]

FINDINGS: Uterus

Measurements: 10.1 x 6.1 x 5.4 cm = volume: 172.5 mL. Nabothian
cysts in the cervix. Left posterior myometrial mass measuring 1.4 x
1.8 x 1.1 cm. Right anterior intramural fundal mass measuring 1.6 x
2.1 x 1.6 cm.

Endometrium

Thickness: 6 mm.  No focal abnormality visualized.

Right ovary

Measurements: 1.5 x 1.8 x 2.1 cm = volume: 3.1 mL. Normal
appearance/no adnexal mass.

Left ovary

Measurements: 1.4 x 1.8 x 1.8 cm = volume: 2.4 mL. Normal
appearance/no adnexal mass.

Other findings

Moderate free fluid in the pelvis.
IMPRESSION: 1. Negative for ovarian mass lesion.
2. Uterine fibroids
3. Moderate free fluid in the pelvis

## 2020-05-30 MED ORDER — LORAZEPAM 0.5 MG PO TABS
0.5000 mg | ORAL_TABLET | Freq: Three times a day (TID) | ORAL | Status: DC | PRN
  Administered 2020-05-31 – 2020-06-01 (×2): 0.5 mg via ORAL
  Filled 2020-05-30 (×3): qty 1

## 2020-05-30 MED ORDER — SODIUM CHLORIDE 0.9 % IV SOLN: 1000.0000 mL | INTRAVENOUS | Status: DC

## 2020-05-30 NOTE — Progress Notes (Signed)
[facility] Progress Note   Subjective:   Feels fine, tol PO intake but requesting to stay on fluids for now.   Objective Vitals:   05/29/20 0408 05/29/20 1553 05/29/20 2001 05/30/20 0547  BP: 133/90 121/88 121/89 119/88  Pulse: (!) 109 (!) 109 (!) 101 (!) 102  Resp: 18 16 20 16   Temp: 99 F (37.2 C) 98.7 F (37.1 C) 98.1 F (36.7 C) 97.7 F (36.5 C)  TempSrc: Oral Oral Oral Oral  SpO2: 99% 98% 98% 100%  Weight:      Height:       Physical Exam General: appears well, eating cereal Heart: tachy, regular Lungs: normal WOB Abdomen: RLQ allograft nontender Extremities: no edema Dialysis Access: R FA AVF +t/b  Additional Objective Labs: Basic Metabolic Panel: Recent Labs  Lab 05/28/20 0128 05/29/20 0106 05/30/20 0041  NA 136 136 138  K 4.0 3.8 4.1  CL 105 107 110  CO2 14* 20* 21*  GLUCOSE 97 133* 123*  BUN 10 9 6   CREATININE 1.29* 1.02* 0.99  CALCIUM 8.3* 8.6* 8.6*   Liver Function Tests: Recent Labs  Lab 05/28/20 0128 05/29/20 0106 05/30/20 0041  AST 232* 200* 144*  ALT 106* 104* 96*  ALKPHOS 265* 334* 304*  BILITOT 2.8* 2.4* 2.0*  PROT 5.6* 5.9* 5.5*  ALBUMIN 2.1* 2.1* 2.0*   Recent Labs  Lab 05/27/20 0113  LIPASE 35   CBC: Recent Labs  Lab 05/27/20 1058 05/27/20 1058 05/27/20 2244 05/27/20 2244 05/28/20 0128 05/29/20 0106 05/30/20 0041  WBC 7.9   < > 7.6   < > 6.9 5.8 5.7  NEUTROABS 5.6  --   --   --   --   --   --   HGB 10.9*   < > 10.6*   < > 9.6* 9.5* 9.1*  HCT 36.6   < > 35.4*   < > 31.5* 31.3* 29.6*  MCV 82.4  --  82.9  --  82.0 80.9 80.7  PLT 350   < > 299   < > 247 235 225   < > = values in this interval not displayed.   Blood Culture    Component Value Date/Time   SDES BLOOD LEFT ARM 05/28/2020 1254   SPECREQUEST  05/28/2020 1254    BOTTLES DRAWN AEROBIC AND ANAEROBIC Blood Culture adequate volume   CULT  05/28/2020 1254    NO GROWTH < 24 HOURS Performed at [facility], [place] [address]  [place], [place] [address]    REPTSTATUS PENDING 05/28/2020 1254    Cardiac Enzymes: No results for input(s): CKTOTAL, CKMB, CKMBINDEX, TROPONINI in the last 168 hours. CBG: No results for input(s): GLUCAP in the last 168 hours. Iron Studies:  Recent Labs    05/27/20 1407  IRON 27*  TIBC 179*  FERRITIN 6,152*   @lablastinr3 @ Studies/Results: No results found. Medications: . sodium chloride     . enoxaparin (LOVENOX) injection  40 mg Subcutaneous Q24H  . fluconazole  100 mg Oral Daily  . mycophenolate  500 mg Oral BID  . pantoprazole  40 mg Oral Daily  . polyethylene glycol  17 g Oral BID  . predniSONE  5 mg Oral Q breakfast  . senna  1 tablet Oral Daily  . sorbitol, milk of mag, mineral oil, glycerin (SMOG) enema  960 mL Rectal Once  . Tacrolimus ER  4 mg Oral Daily    Assessment/Plan **Adenopathy and liver lesions:  PTLD, lymphoma, metastatic RCCa; biopsy  showing carcinoma with additional stains pending.  EBV PCR pending. Tumor markers pending. Heme/onc following  **s/p renal transplant:  2nd transplant at [facility] 2019 --> renal function with mild AKI on admission now back to baseline. Volume depletion, anemia and contrast all likely playing role in AKI.   UA bland.  On home immunosuppression with cellcept 500 BID,pred 5 daily and Envarsus 4 daily. Prograf trough pending (on invarsus daily).  Ok to continue IVF today but still tolerating po intake tomorrow could hold.   **Anemia:  Improved s/p 2u pRBC; has h/o menorrhagia.  Ferritin is 6100, total iron 27, iron sat 15%; MCV low normal.  Per hematology.   **AGMA: transiently noted on admission, lactate normal; note ketones in UA day prior, pt with mild AKI at that time --> suspect it was a combo of those factors.  AG normalized today and serum bicarb up to 20 c/w NAGMA likely related to NS infusion.  Prograf can also cause type 4 RTA and this could be contributory. Cont to monitor and if stays low can supplement.   Will follow -  page with questions, concerns.   Jannifer Hick MD 05/30/2020, 10:45 AM  [facility] Pager: [phone]

## 2020-05-30 NOTE — Progress Notes (Addendum)
Family Medicine Teaching Service Daily Progress Note Intern Pager: [phone]  Patient name: Candace Griffin Medical record number: [id number] Date of birth: May 29, 1976 Age: 44 y.o. Gender: female  Primary Care Provider: Richarda Osmond, DO Consultants: IR, Oncology, Nephrology Code Status: FULL  Pt Overview and Major Events to Date:  Admitted 10/4 LN biopsy 10/4  Assessment and Plan: Candace Griffin a 44 y.o.femalepresenting with CP, SOB. PMH is significant fors/p renal transplant, anemia, GERD, HTN, menorrhagia   Symptomatic anemia On presentation, hgb 4.0 (hgb 8.6 at prior ED presentation 13 days ago). Normal platelets and WBCs. S/p 2 uPRBCs . Current Hb stable at 9.1. Pt still tachycardic. Heme/onc consulted, appreciate recs - Monitor Hb  - continuous telemetry - d/c mIVF   Lymphadenopathy Seen on abd/pelvis CT 9/23 and 10/4. Concerning for posttransplant lymphoproliferative disease vs. Lymphoma, vs. Metastatic disease. Also has multiple liver lesions and renal mass. CMET shows elevation of liver enzymes from 9/23. AST 336, ALT, 105, alk phos 242. About a 25 lb weight loss in past 2 months. S/p US guided inguinal lymph node biopsy 10/4 - f/u biopsy results - preliminary path results show carcinoma per onc notes. Will await final results  - Dilaudid 0.5 mg q 4 hours prn. Switch to po oxycodone before discharge  - patient to see oncology clinic outpatient for tx   Stomach Pain Patient reports right sided stomach pain for the past 3 weeks. States she came to the hospital on 9/23 for it and CT showed extensive lymphadenopathy. Significantly tender to light palpation on exam  - Dilaudid 0.34m q 4 hrs. Transition to po oxycodone   Fever, resolved Patient spiked fever (Tmax 101.3) on 10/5. UA negative for infection on admission. Ordered urine culture  - f/u urine culture    S/p renal transplant [facility]  [facility]. Home meds include mycophenolate and tacrolimus, prednisone. Cr 1.13 on admission. Updated [facility] on patient's status. Nephrology following, appreciate recs.  - continue immunosuppressants, nephro to adjust as needed  - RFP am - monitor kidney function closely in setting of anemia, dehydration, and starting potentially nephrotoxic medications.     Constipation, chronic Chronic and worsening for the past 5 months. Patient endorses about a month since last full BM. Only a few pellets when straining very hard. No obstruction seen on abdominal CT  - enema - PO laxitives - suppositories PRN  Thrush Immunocompromised patient with throat pain x2 weeks and white plaque on tongue and pharynx. H/o thrush. - fluconazole 1057mdaily. likely 2 week tx.  - topical pain relief to avoid use of NSAIDs and tylenol - if not improving in a week or so, recommend EGD with biopsy  FEN/GI: Regular diet PPx: SCD  Disposition:Pending Heme/ Onc   Subjective:   Patient sitting in bed and states her pain is better since she just received her pain medicine. Still endorses stomach pain. She reports having an episode of emesis this morning. Stated she is able to eat some food and drink. She states she would like to keep her IV fluids on because she feels better with it. When asked about patient refusing Lovenox she stated because she did not want to be injected. Agreed to using SCDs and walking around. She told me about her family and support system and seemed to be in good spirits. Denies any other complaints.   Objective: Temp:  [97.7 F (36.5 C)-98.7 F (37.1 C)] 97.7 F (  36.5 C) (10/07 0547) Pulse Rate:  [101-109] 102 (10/07 0547) Resp:  [16-20] 16 (10/07 0547) BP: (119-121)/(88-89) 119/88 (10/07 0547) SpO2:  [98 %-100 %] 100 % (10/07 0547) Physical Exam: General: alert, NAD Cardiovascular: RRR no murmurs  Respiratory: CTAB. Normal WOB  Abdomen: soft, tender to palpation Extremities:  warm, dry. No edema. Distal pulses 2+ bilaterally   Laboratory: Recent Labs  Lab 05/28/20 0128 05/29/20 0106 05/30/20 0041  WBC 6.9 5.8 5.7  HGB 9.6* 9.5* 9.1*  HCT 31.5* 31.3* 29.6*  PLT 247 235 225   Recent Labs  Lab 05/28/20 0128 05/29/20 0106 05/30/20 0041  NA 136 136 138  K 4.0 3.8 4.1  CL 105 107 110  CO2 14* 20* 21*  BUN '10 9 6  ' CREATININE 1.29* 1.02* 0.99  CALCIUM 8.3* 8.6* 8.6*  PROT 5.6* 5.9* 5.5*  BILITOT 2.8* 2.4* 2.0*  ALKPHOS 265* 334* 304*  ALT 106* 104* 96*  AST 232* 200* 144*  GLUCOSE 97 133* 123*     Imaging/Diagnostic Tests:  No new images  Shary Key, DO 05/30/2020, 7:38 AM PGY-1, [place] Intern pager: [phone], text pages welcome

## 2020-05-31 ENCOUNTER — Other Ambulatory Visit: Payer: Self-pay

## 2020-05-31 ENCOUNTER — Inpatient Hospital Stay (HOSPITAL_COMMUNITY): Payer: Medicare Other

## 2020-05-31 DIAGNOSIS — C787 Secondary malignant neoplasm of liver and intrahepatic bile duct: Secondary | ICD-10-CM | POA: Diagnosis not present

## 2020-05-31 LAB — TYPE AND SCREEN
ABO/RH(D): O POS
Antibody Screen: POSITIVE
Donor AG Type: NEGATIVE
Donor AG Type: NEGATIVE
Unit division: 0
Unit division: 0
Unit division: 0
Unit division: 0

## 2020-05-31 LAB — COMPREHENSIVE METABOLIC PANEL
ALT: 85 U/L — ABNORMAL HIGH (ref 0–44)
AST: 109 U/L — ABNORMAL HIGH (ref 15–41)
Albumin: 2 g/dL — ABNORMAL LOW (ref 3.5–5.0)
Alkaline Phosphatase: 298 U/L — ABNORMAL HIGH (ref 38–126)
Anion gap: 11 (ref 5–15)
BUN: 9 mg/dL (ref 6–20)
CO2: 19 mmol/L — ABNORMAL LOW (ref 22–32)
Calcium: 8.8 mg/dL — ABNORMAL LOW (ref 8.9–10.3)
Chloride: 107 mmol/L (ref 98–111)
Creatinine, Ser: 1.04 mg/dL — ABNORMAL HIGH (ref 0.44–1.00)
GFR calc non Af Amer: >60 mL/min (ref >60)
Glucose, Bld: 103 mg/dL — ABNORMAL HIGH (ref 70–99)
Potassium: 3.8 mmol/L (ref 3.5–5.1)
Sodium: 137 mmol/L (ref 135–145)
Total Bilirubin: 2.1 mg/dL — ABNORMAL HIGH (ref 0.3–1.2)
Total Protein: 5.4 g/dL — ABNORMAL LOW (ref 6.5–8.1)

## 2020-05-31 LAB — BPAM RBC
Blood Product Expiration Date: 202110192359
Blood Product Expiration Date: 202110252359
Blood Product Expiration Date: 202111012359
Blood Product Expiration Date: 202111052359
ISSUE DATE / TIME: 202110040258
ISSUE DATE / TIME: 202110040613
Unit Type and Rh: 5100
Unit Type and Rh: 5100
Unit Type and Rh: 5100
Unit Type and Rh: 5100

## 2020-05-31 LAB — PROTIME-INR
INR: 1.2 (ref 0.8–1.2)
Prothrombin Time: 14.9 seconds (ref 11.4–15.2)

## 2020-05-31 LAB — CBC
HCT: 30.2 % — ABNORMAL LOW (ref 36.0–46.0)
Hemoglobin: 9.2 g/dL — ABNORMAL LOW (ref 12.0–15.0)
MCH: 25.1 pg — ABNORMAL LOW (ref 26.0–34.0)
MCHC: 30.5 g/dL (ref 30.0–36.0)
MCV: 82.3 fL (ref 80.0–100.0)
Platelets: 251 10*3/uL (ref 150–400)
RBC: 3.67 MIL/uL — ABNORMAL LOW (ref 3.87–5.11)
RDW: 16.6 % — ABNORMAL HIGH (ref 11.5–15.5)
WBC: 5.9 10*3/uL (ref 4.0–10.5)
nRBC: 0 % (ref 0.0–0.2)

## 2020-05-31 LAB — LACTATE DEHYDROGENASE: LDH: 178 U/L (ref 98–192)

## 2020-05-31 IMAGING — US US ABDOMEN LIMITED
1 series · 14 of 25 positions shown · non-contrast
Comparison: [DATE]

CLINICAL DATA: Liver mass.  Recent biopsy.  Pain post biopsy

EXAM:
ULTRASOUND ABDOMEN LIMITED RIGHT UPPER QUADRANT

[Series 1: us abdomen limited ruq · 14 of 67 slices shown]
[im 1/67]
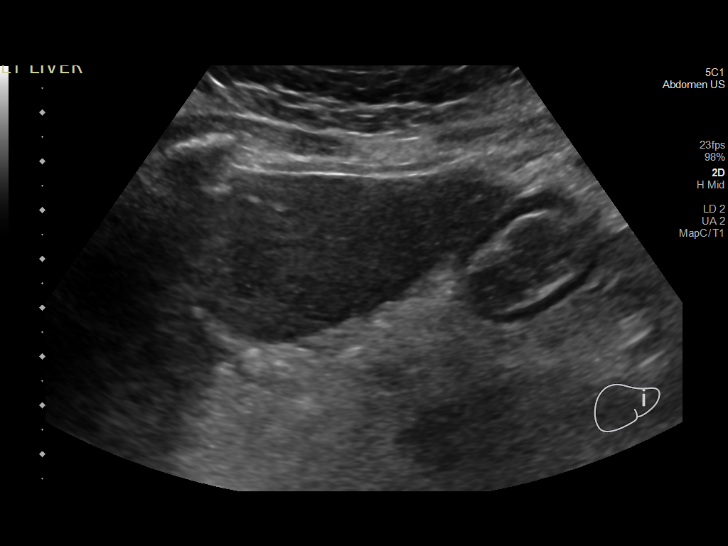
[im 6/67]
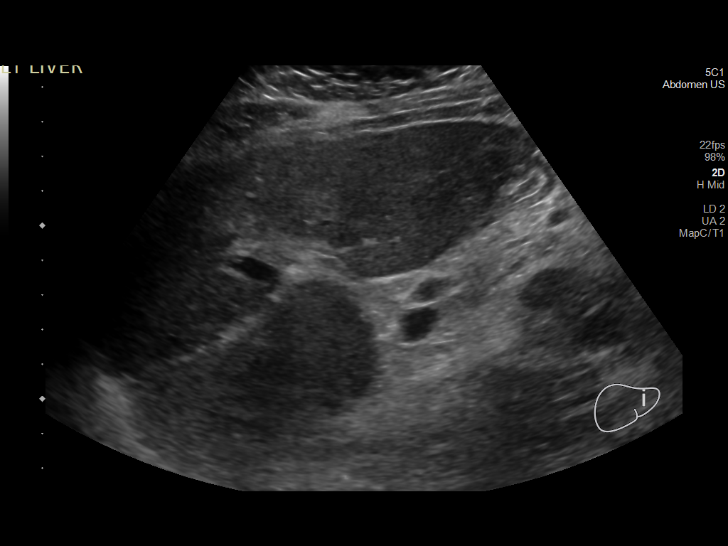
[im 12/67]
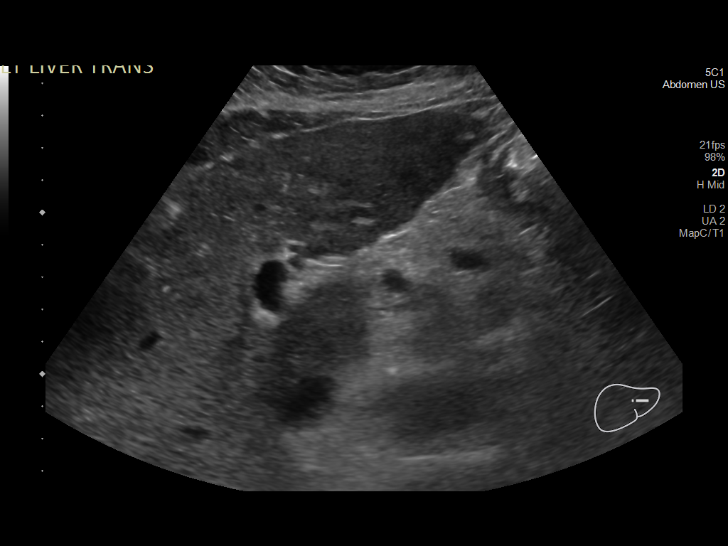
[im 17/67]
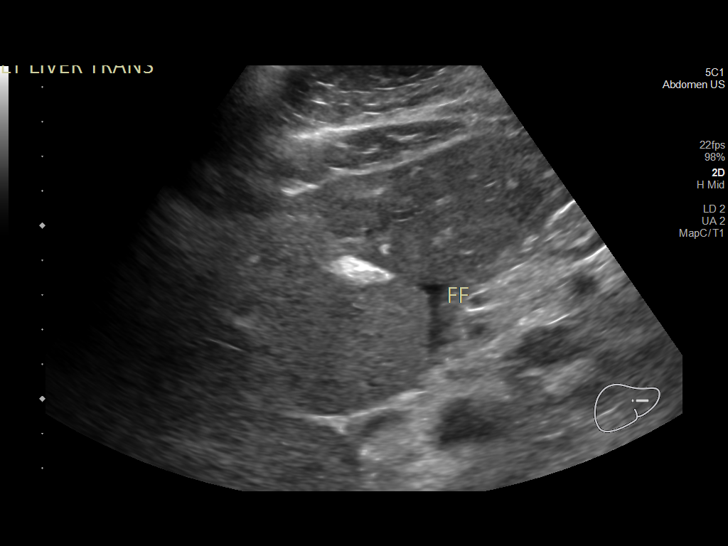
[im 23/67]
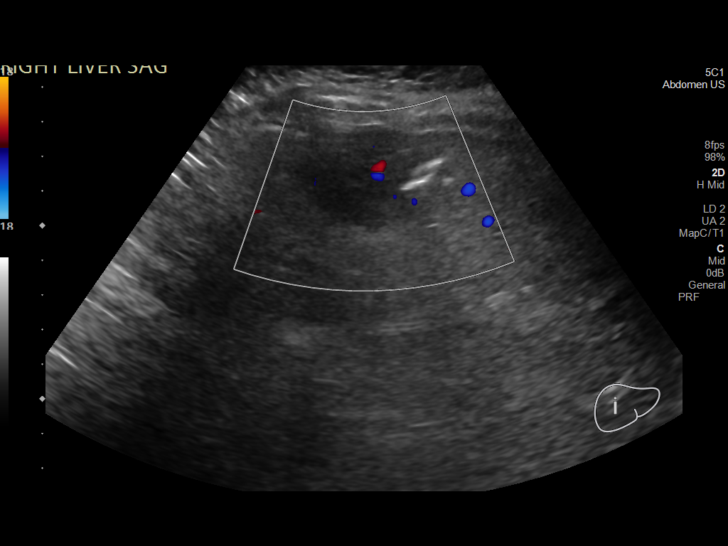
[im 25/67]
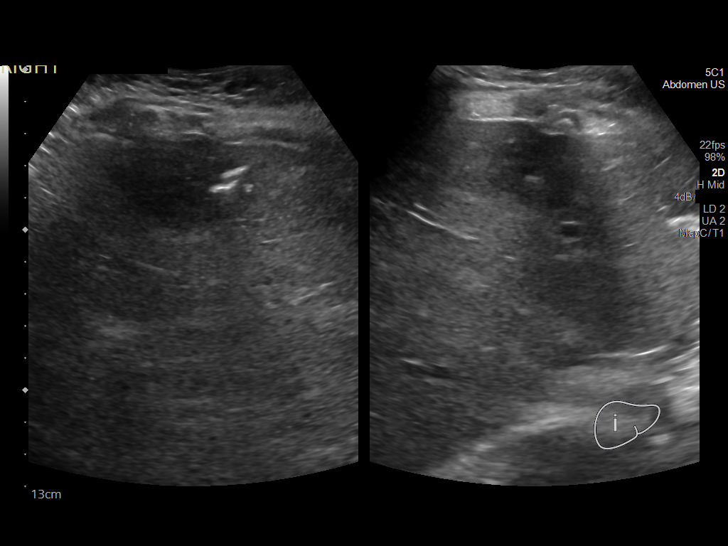
[im 31/67]
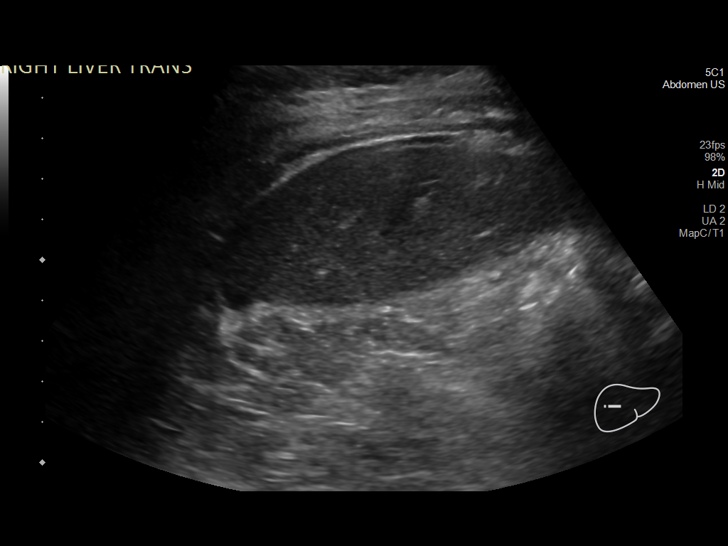
[im 36/67]
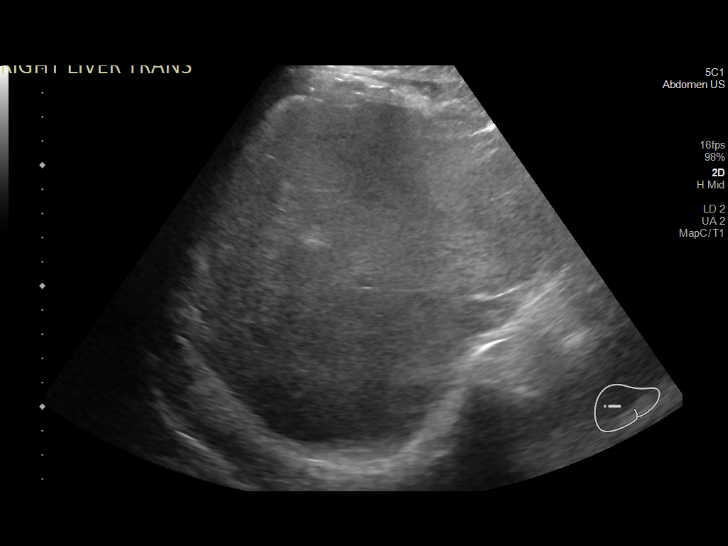
[im 42/67]
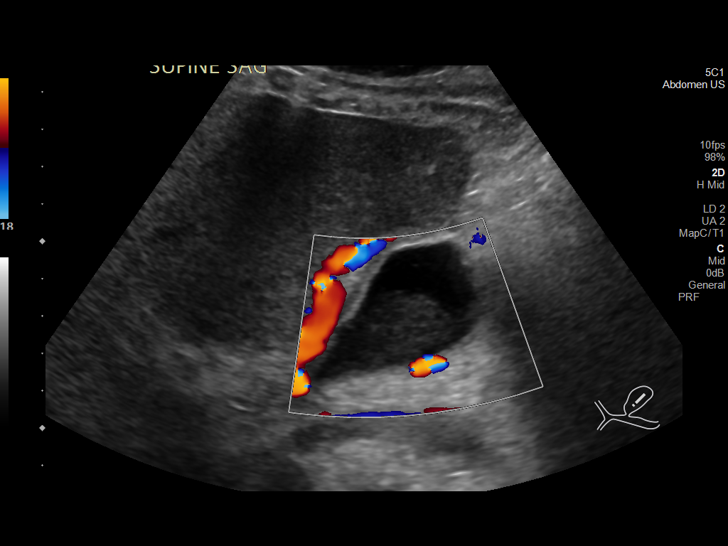
[im 45/67]
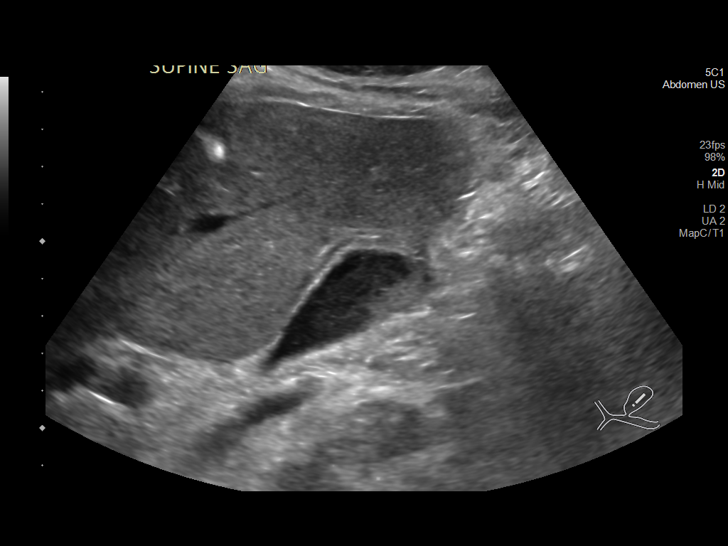
[im 50/67]
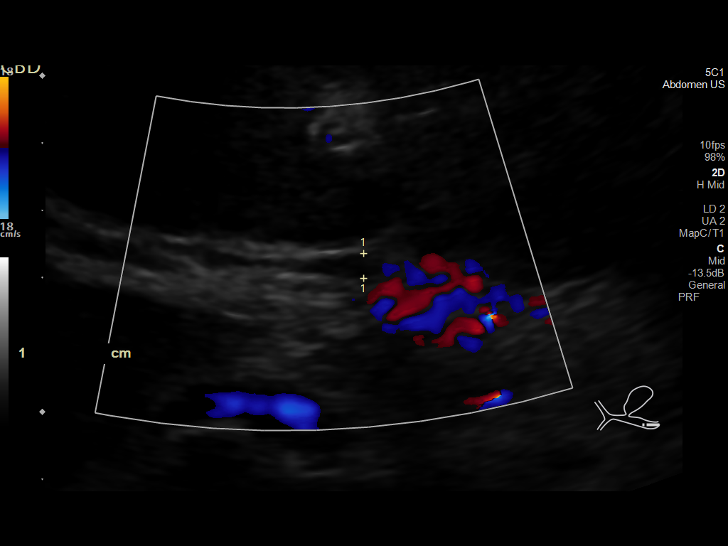
[im 56/67]
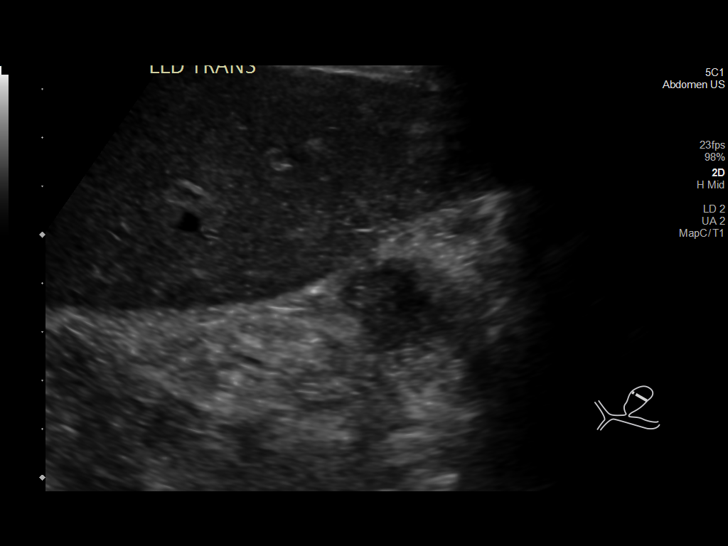
[im 61/67]
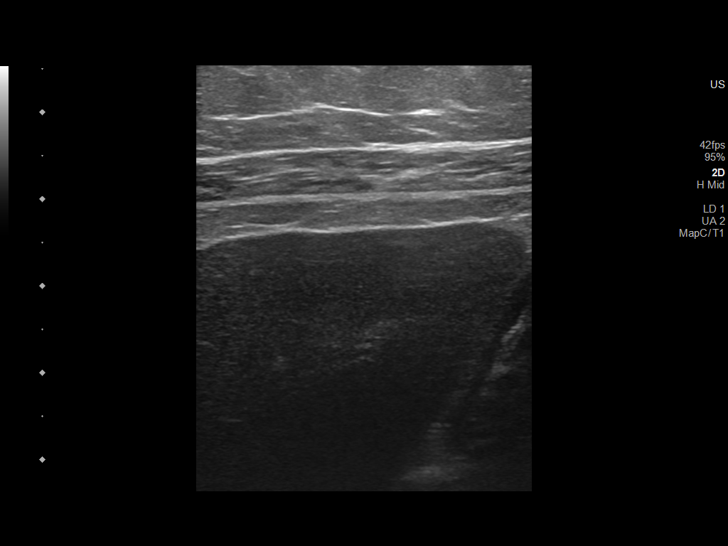
[im 67/67]
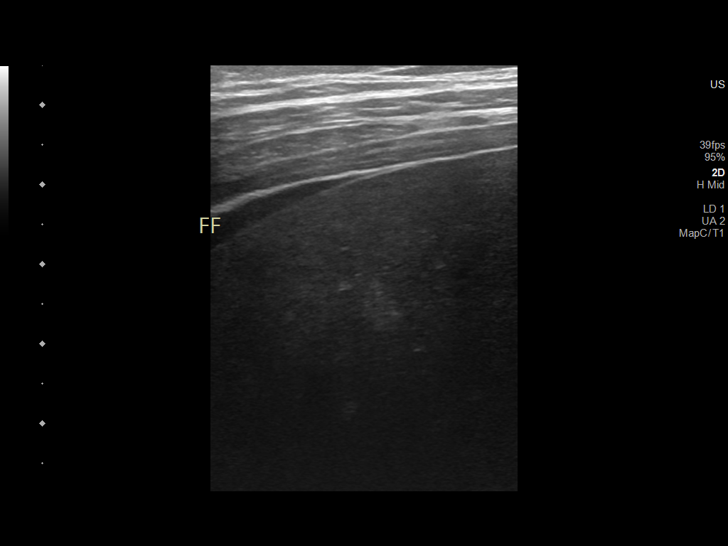

[14 of 25 positions shown; findings below may reference images not displayed]

FINDINGS: Gallbladder:

There is gallbladder sludge without sonographic evidence for acute
cholecystitis. There is no gallbladder wall thickening. The
sonographic Murphy sign is negative.

Common bile duct:

Diameter: 4 mm

Liver:

Multiple hepatic lesions are again noted phi, better appreciated on
the patient's prior CT. Portal vein is patent on color Doppler
imaging with normal direction of blood flow towards the liver.

Other: There is a trace amount of perihepatic free fluid.
IMPRESSION: Trace perihepatic free fluid. Otherwise, stable exam with multiple
hepatic lesions again identified.

## 2020-05-31 IMAGING — US US BIOPSY CORE LIVER
1 series · 12 of 12 positions shown · non-contrast
Comparison: none

INDICATION: 44-year-old female with widespread nodal and hepatic metastatic
disease of uncertain etiology. She does have a solid endophytic mass
in the left renal hilum. Patient recently underwent
ultrasound-guided core biopsy of a metastatic left inguinal lymph
node. Biopsy specimens were diagnostic for carcinoma but nonspecific
as to tissue source. Therefore, patient presents for repeat biopsy
of 1 of the hepatic lesions.

[Series 1: us biopsy (liver) · 12 of 12 slices shown]
[im 1/12]
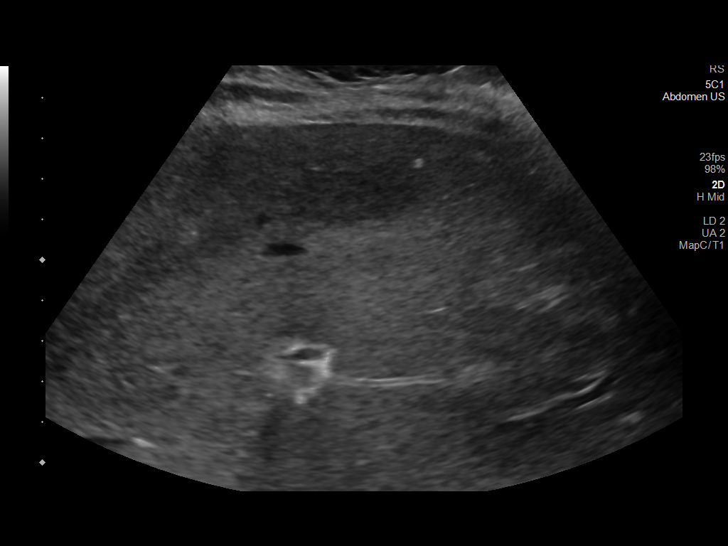
[im 2/12]
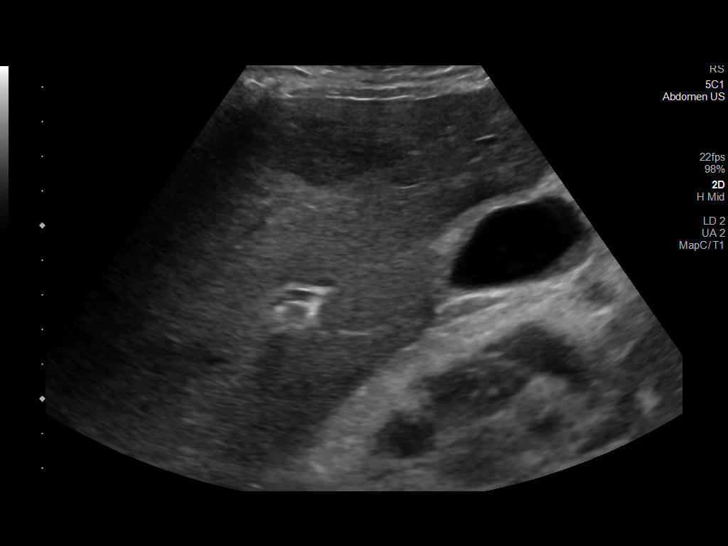
[im 3/12]
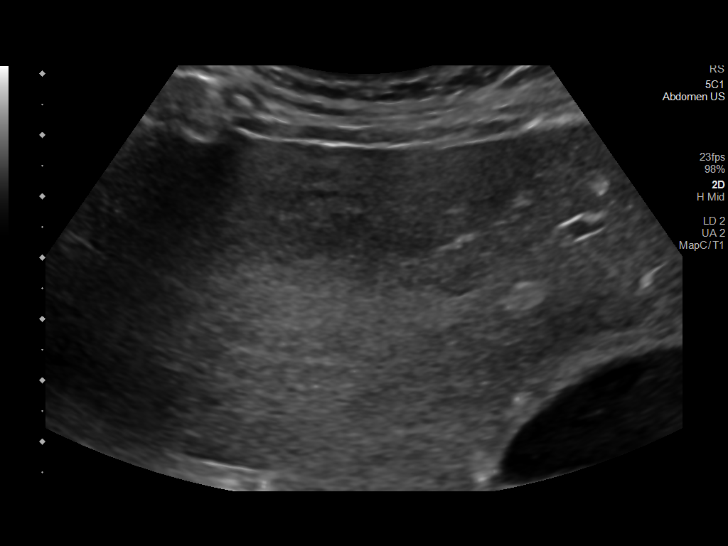
[im 4/12]
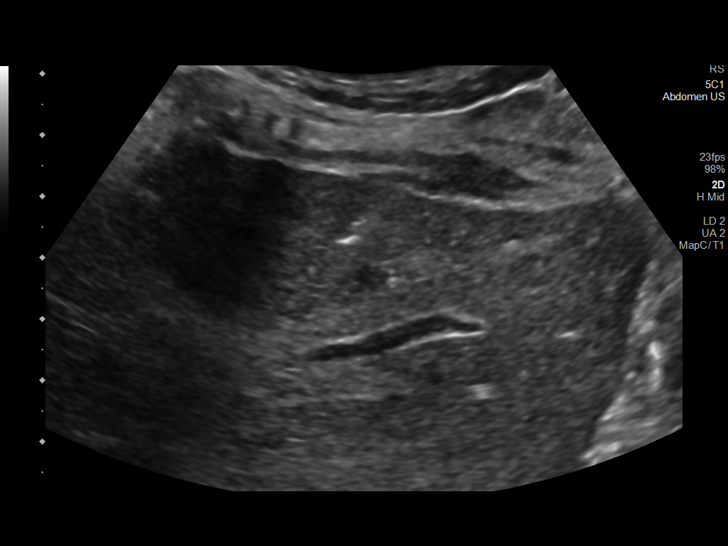
[im 5/12]
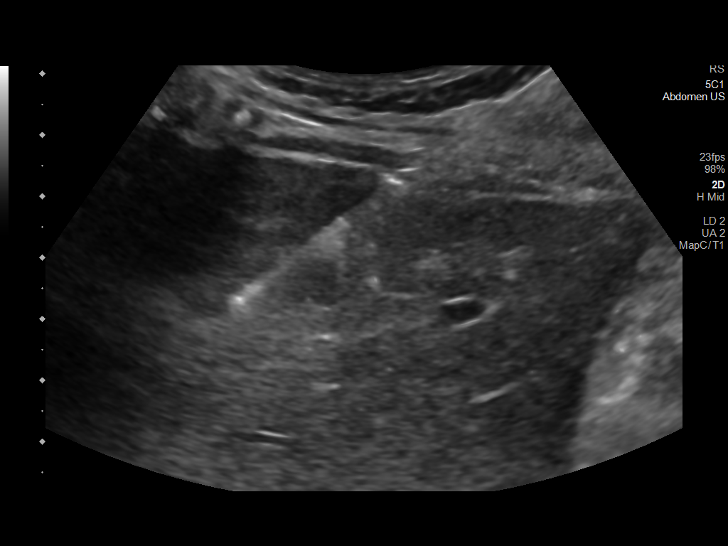
[im 6/12]
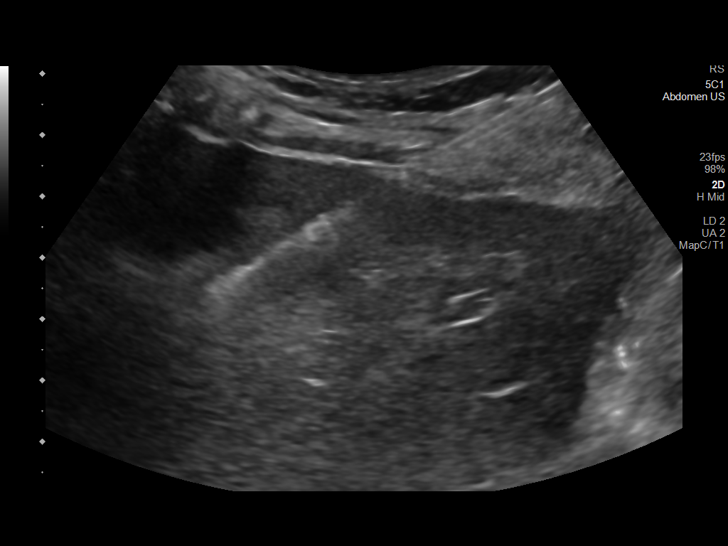
[im 7/12]
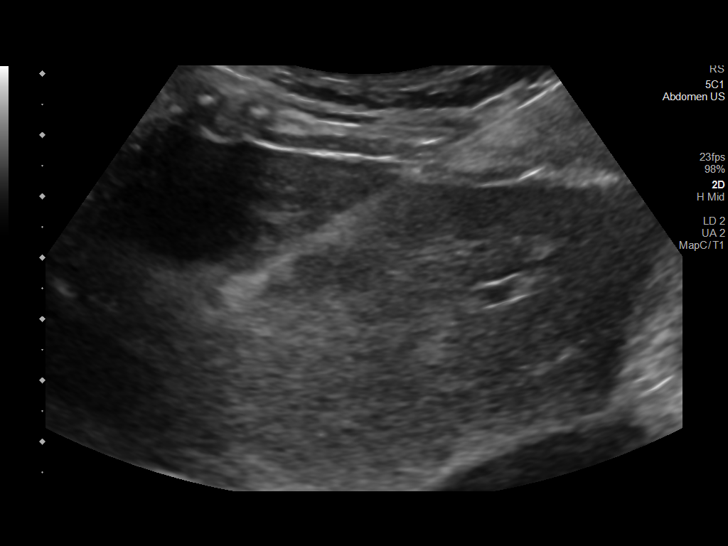
[im 8/12]
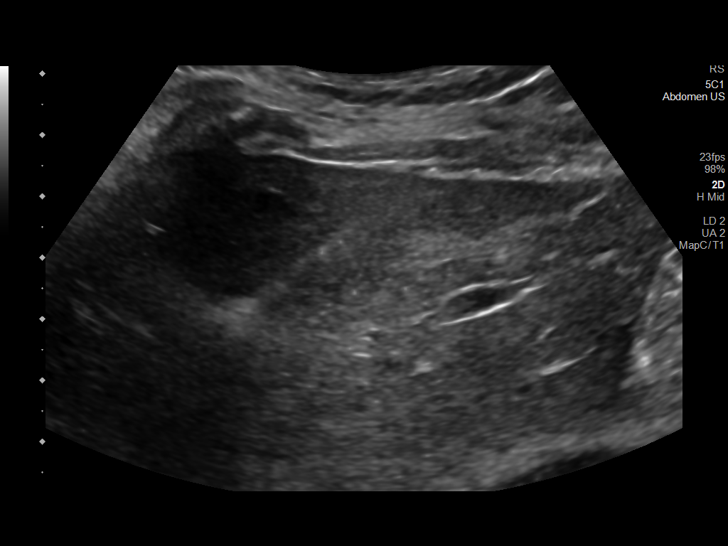
[im 9/12]
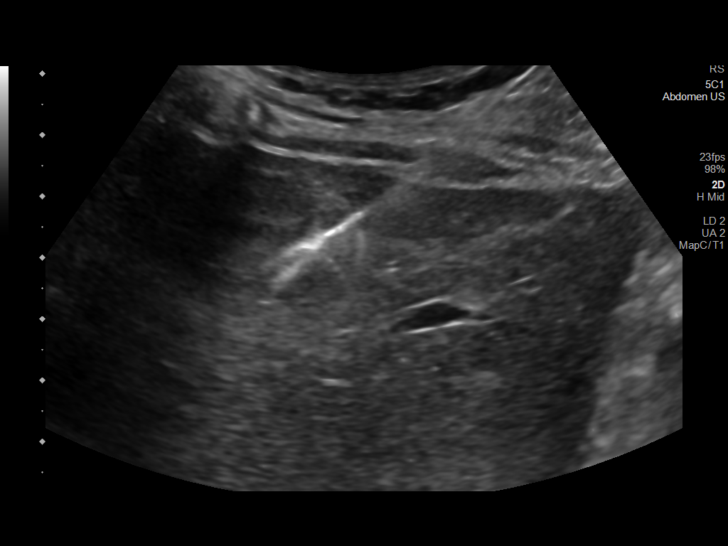
[im 10/12]
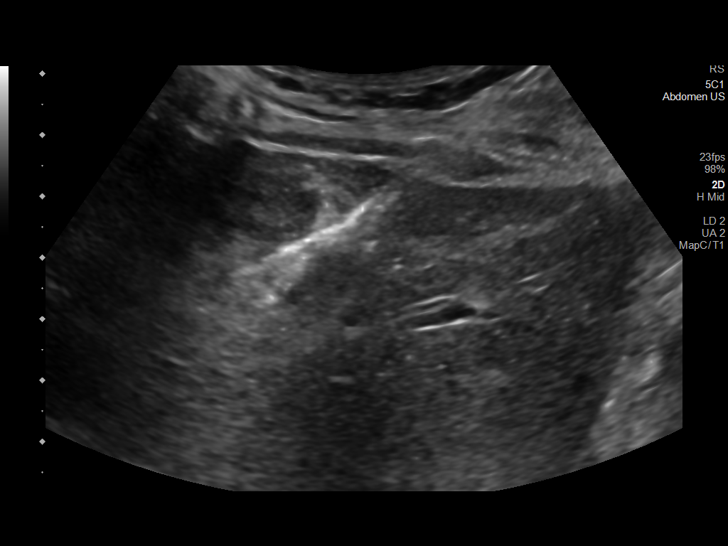
[im 11/12]
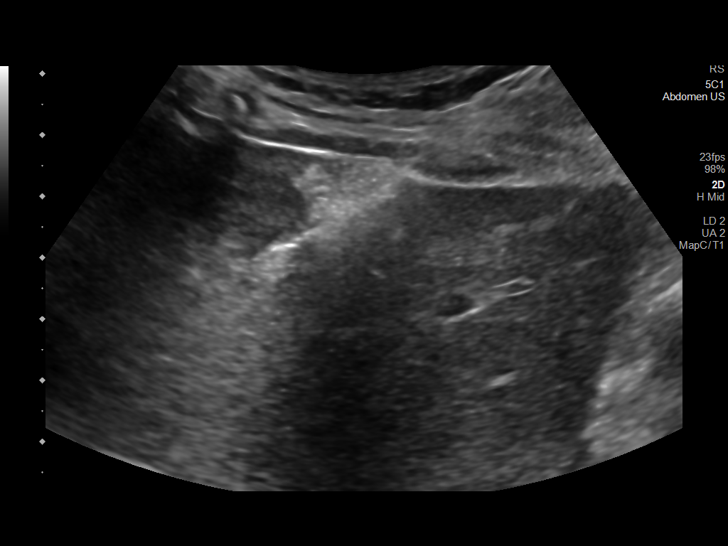
[im 12/12]
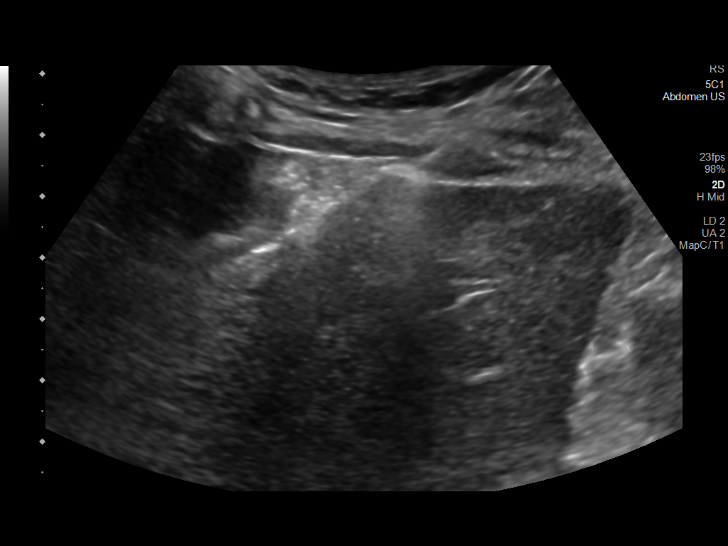

[12 of 12 positions shown; findings below may reference images not displayed]

EXAM:
ULTRASOUND BIOPSY CORE LIVER

MEDICATIONS:
None.

ANESTHESIA/SEDATION:
Moderate (conscious) sedation was employed during this procedure. A
total of Versed 2 mg and Fentanyl 100 mcg was administered
intravenously.

Moderate Sedation Time: 9 minutes. The patient's level of
consciousness and vital signs were monitored continuously by
radiology nursing throughout the procedure under my direct
supervision.

FLUOROSCOPY TIME:  None

COMPLICATIONS:
None immediate.

PROCEDURE:
Informed written consent was obtained from the patient after a
thorough discussion of the procedural risks, benefits and
alternatives. All questions were addressed. Maximal Sterile Barrier
Technique was utilized including caps, mask, sterile gowns, sterile
gloves, sterile drape, hand hygiene and skin antiseptic. A timeout
was performed prior to the initiation of the procedure.

The liver was interrogated with ultrasound. A well-defined hepatic
lesion was successfully identified. A suitable skin entry site was
selected and marked. The overlying skin was sterilely prepped and
draped in the standard fashion using chlorhexidine skin prep. Local
anesthesia was attained by infiltration with 1% lidocaine. A small
dermatotomy was made.

Under real-time ultrasound guidance, a 17 gauge introducer needle
was advanced through the liver and positioned at the margin of the
mass. Multiple 18 gauge core biopsies were then obtained coaxially
using OGLE automated biopsy device. Biopsy specimens were
placed in formalin and delivered to pathology for further analysis.

As the introducer needle was removed, the biopsy tract was embolized
with a Gel-Foam slurry. The patient tolerated the procedure well.
IMPRESSION: Technically successful ultrasound-guided core biopsy of liver
lesion.

## 2020-05-31 MED ORDER — FENTANYL CITRATE (PF) 100 MCG/2ML IJ SOLN
INTRAMUSCULAR | Status: AC
  Filled 2020-05-31: qty 2

## 2020-05-31 MED ORDER — MIDAZOLAM HCL 2 MG/2ML IJ SOLN
INTRAMUSCULAR | Status: AC | PRN
  Administered 2020-05-31: 0.5 mg via INTRAVENOUS
  Administered 2020-05-31: 1 mg via INTRAVENOUS
  Administered 2020-05-31: 0.5 mg via INTRAVENOUS

## 2020-05-31 MED ORDER — GELATIN ABSORBABLE 12-7 MM EX MISC
CUTANEOUS | Status: AC
  Filled 2020-05-31: qty 1

## 2020-05-31 MED ORDER — SODIUM CHLORIDE 0.9 % IV SOLN
INTRAVENOUS | Status: AC | PRN
  Administered 2020-05-31: 10 mL/h via INTRAVENOUS

## 2020-05-31 MED ORDER — FENTANYL CITRATE (PF) 100 MCG/2ML IJ SOLN
INTRAMUSCULAR | Status: AC | PRN
  Administered 2020-05-31 (×2): 25 ug via INTRAVENOUS
  Administered 2020-05-31: 50 ug via INTRAVENOUS

## 2020-05-31 MED ORDER — MIDAZOLAM HCL 2 MG/2ML IJ SOLN
INTRAMUSCULAR | Status: AC
  Filled 2020-05-31: qty 2

## 2020-05-31 MED ORDER — LIDOCAINE HCL (PF) 1 % IJ SOLN
INTRAMUSCULAR | Status: AC
  Filled 2020-05-31: qty 30

## 2020-05-31 NOTE — Procedures (Signed)
Interventional Radiology Procedure Note  Procedure: US guided core biopsy of liver lesion  Complications: None  Estimated Blood Loss: None  Recommendations: - Bedrest x 2 hrs - Path sent    Signed,  Hajer Dwyer K. Valerya Maxton, MD    

## 2020-05-31 NOTE — Progress Notes (Signed)
FPTS Interim Progress Note  Went to check on patient after her biopsy, states that she is doing well. She has not eaten anything all day because of her biopsy and when she came back she reports sleeping and just waking up a few minutes ago. States that she has an appetite, just has not received the opportunity to eat. Patient is aware of upcoming RUQ ultrasound. We are planning on transitioning her from IV dilaudid to PO oxycodone, she agrees to this but prefers to keep the IV form for more manageable pain control. Rates her pain 6/10 currently which she endorses is a significant improvement since admission. As far as patient is aware, she has been given mycophenolate and has tolerated it well.   Donney Dice, DO 05/31/2020, 5:52 PM PGY-1, [place] Medicine Service pager [phone]

## 2020-05-31 NOTE — Progress Notes (Addendum)
Family Medicine Teaching Service Daily Progress Note Intern Pager: [phone]  Patient name: Candace Griffin Medical record number: [id number] Date of birth: April 23, 1976 Age: 44 y.o. Gender: female  Primary Care Provider: Richarda Osmond, DO Consultants: IR, oncology, nephrology  Code Status: FULL  Pt Overview and Major Events to Date:  Admitted 10/4 LN biopsy 10/4  Assessment and Plan: Candace Griffin a 44 y.o.femalepresenting with CP, SOB. PMH is significant fors/p renal transplant, anemia, GERD, HTN, menorrhagia   Lymphadenopathy c/w Carcinoma  Also has multiple liver lesions and renal mass. About a 25 lb weight loss in past 2 months. S/p US guided inguinal lymph node biopsy 10/4 - f/u biopsy results - preliminary path results show carcinoma but staining pattern is non specific. New stains pending. Will await final results  - Dilaudid 0.5 mg q 4 hours prn. Switch to po oxycodone after biopsy today - US pelvis negative for ovarian mass lesion  - CT guided biopsy of renal lesion scheduled for today. Patient has not had anything to eat or drink since last night at about 11pm.   Anemia  On presentation, hgb 4.0 (hgb 8.6 at prior ED presentation 13 days ago). Normal platelets and WBCs. S/p 2 uPRBCs . Current Hb stable at 9.2. Transfusion threshold 8. Pt still tachycardic. Heme/onc consulted, appreciate recs - Monitor Hb   Stomach Pain 2/2 extensive lymphadenopathy likely due to carcinoma. Tender to light palpation particularly on right side on exam  - Dilaudid 0.5mg  q 4 hrs. Transition to po oxycodone before discharge  Fever, resolved Patient spiked fever (Tmax 101.3) on 10/5. Likely due to carcinoma vs infection. UA negative for infection on admission. Blood culture NGTD  - f/u cultures   S/p renal transplant Follows up regularly with nephrology at [facility]. Home meds include mycophenolate and tacrolimus, prednisone. Cr 1.13 on admission. Updated [facility] on patient's  status. Nephrology following, appreciate recs.  - continue immunosuppressants, nephro to adjust as needed  - RFP am    Constipation, chronic Chronic and worsening for the past 5 months. Patient able to have a good bowel movement on 10/6. - Enema  - PO laxitives - suppositories PRN  Thrush Immunocompromised patient with throat pain x2 weeks and white plaque on tongue and pharynx. H/o thrush. - fluconazole 100mg  daily. likely 2 week tx.  - if not improving, can consider EGD with biopsy  FEN/GI: Regular diet PPx: SCD due to patient refusing Lovenox injections   Disposition: Med-surg   Subjective:   Patient sitting in bed when I examined her. No episodes of emesis over night. Feeling better after receiving her pain and nausea medication this morning. We talked about her cancer diagnosis and she expressed her frustrations that this is happening to her since she is young and has been relatively healthy and never smoked or drank alcohol. Worried about how her daughter is taking the news. She has questions about specifics of her cancer diagnosis and is wondering how long the kidney biopsy will take to get back. She expresses that she is remaining hopeful.   Objective: Temp:  [97.7 F (36.5 C)-98.1 F (36.7 C)] 97.7 F (36.5 C) (10/07 2057) Pulse Rate:  [102-110] 110 (10/07 2057) Resp:  [16-20] 20 (10/07 2057) BP: (114-132)/(88-95) 132/95 (10/07 2057) SpO2:  [98 %-100 %] 98 % (10/07 2057) Physical Exam: General: pleasant, NAD  Cardiovascular: RRR no murmurs Respiratory: CTAB. Normal WOB Abdomen: soft, tender to palpation diffusely  Extremities: warm, dry. Pulses 2+ bilaterally   Laboratory: Recent Labs  Lab 05/29/20 0106 05/30/20 0041 05/31/20 0213  WBC 5.8 5.7 5.9  HGB 9.5* 9.1* 9.2*  HCT 31.3* 29.6* 30.2*  PLT 235 225 251   Recent Labs  Lab 05/29/20 0106 05/30/20 0041 05/31/20 0213  NA 136 138 137  K 3.8 4.1 3.8  CL 107 110 107  CO2 20* 21* 19*  BUN 9 6 9    CREATININE 1.02* 0.99 1.04*  CALCIUM 8.6* 8.6* 8.8*  PROT 5.9* 5.5* 5.4*  BILITOT 2.4* 2.0* 2.1*  ALKPHOS 334* 304* 298*  ALT 104* 96* 85*  AST 200* 144* 109*  GLUCOSE 133* 123* 103*    Imaging/Diagnostic Tests:  US PELVIC COMPLETE WITH TRANSVAGINAL Result Date: 05/30/2020  IMPRESSION: 1. Negative for ovarian mass lesion. 2. Uterine fibroids 3. Moderate free fluid in the pelvis   Shary Key, DO 05/31/2020, 5:45 AM PGY-1, [place] Intern pager: [phone], text pages welcome

## 2020-05-31 NOTE — Progress Notes (Signed)
Chief Complaint: Lymphadenopathy  Referring Physician(s): Nelson Chimes  Supervising Physician: Sandi Mariscal  Patient Status: [facility] - ED  History of Present Illness: Candace Griffin is a 44 y.o. female hx of HTN and renal transport. She is being worked up for metastatic process. Had LN bx a few days ago that showed carcinoma but indeterminate for primary. Oncology has requested further tissue sampling. Imaging reviewed still has left renal mass and liver lesions. PMHx, meds, labs, imaging, allergies reviewed. Feels well, no recent fevers, chills, illness. Has been NPO today as directed.   Past Medical History:  Diagnosis Date  . Chronic glomerulonephritis   . Dialysis patient ([place])   . End stage renal disease ([place])   . GERD (gastroesophageal reflux disease)   . History of renal transplant 10/26/2011  . Hypertension     Past Surgical History:  Procedure Laterality Date  . COLPOSCOPY  2016  . diaylsis shunt Right    arm  . KIDNEY TRANSPLANT  10-26-11   deceased donor kidney    Allergies: Lisinopril, Other, and Tape  Medications:  Current Facility-Administered Medications:  .  acetaminophen (TYLENOL) tablet 500 mg, 500 mg, Oral, Q8H PRN, Benay Pike, MD, 500 mg at 05/28/20 1340 .  fluconazole (DIFLUCAN) tablet 100 mg, 100 mg, Oral, Daily, Anderson, Chelsey L, DO, 100 mg at 05/31/20 0930 .  Glycerin (Adult) 2.1 g suppository 1 suppository, 1 suppository, Rectal, Daily PRN, Anderson, Chelsey L, DO .  HYDROmorphone (DILAUDID) injection 0.5 mg, 0.5 mg, Intravenous, Q4H PRN, Paige, Victoria J, DO, 0.5 mg at 05/31/20 0733 .  LORazepam (ATIVAN) tablet 0.5 mg, 0.5 mg, Oral, Q8H PRN, Brunetta Genera, MD, 0.5 mg at 05/31/20 0107 .  mycophenolate (CELLCEPT) capsule 500 mg, 500 mg, Oral, BID, Pashayan, Redgie Grayer, MD, 500 mg at 05/30/20 2154 .  ondansetron (ZOFRAN) injection 4 mg, 4 mg, Intravenous, Q8H PRN, Anderson, Chelsey L, DO, 4 mg at 05/31/20 0734 .   ondansetron (ZOFRAN-ODT) disintegrating tablet 8 mg, 8 mg, Oral, Q8H PRN, Anderson, Chelsey L, DO, 8 mg at 05/27/20 0841 .  pantoprazole (PROTONIX) EC tablet 40 mg, 40 mg, Oral, Daily, Brimage, Vondra, DO, 40 mg at 05/31/20 0930 .  phenol (CHLORASEPTIC) mouth spray 1 spray, 1 spray, Mouth/Throat, PRN, Anderson, Chelsey L, DO, 1 spray at 05/27/20 1449 .  polyethylene glycol (MIRALAX / GLYCOLAX) packet 17 g, 17 g, Oral, BID, Anderson, Chelsey L, DO .  predniSONE (DELTASONE) tablet 5 mg, 5 mg, Oral, Q breakfast, Anderson, Chelsey L, DO, 5 mg at 05/31/20 0733 .  senna (SENOKOT) tablet 8.6 mg, 1 tablet, Oral, Daily, Anderson, Chelsey L, DO, 8.6 mg at 05/27/20 1655 .  sorbitol, milk of mag, mineral oil, glycerin (SMOG) enema, 960 mL, Rectal, Once, Anderson, Chelsey L, DO .  Tacrolimus ER (Envarsus XR) 4 mg per 4 tablets - patient's own supply, 4 mg, Oral, Daily, Leeanne Rio, MD, 4 mg at 05/31/20 1034    Family History  Problem Relation Age of Onset  . Diabetes Father   . Diabetes Paternal Aunt   . Diabetes Paternal Grandmother   . Alcohol abuse Paternal Grandmother   . Alcohol abuse Paternal Uncle   . Cancer Maternal Grandmother        breast  . Breast cancer Maternal Grandmother   . Alcohol abuse Paternal Grandfather     Social History   Socioeconomic History  . Marital status: Single    Spouse name: Not on file  . Number of  children: 3  . Years of education: 48  . Highest education level: Not on file  Occupational History    Comment: NA  Tobacco Use  . Smoking status: Never Smoker  . Smokeless tobacco: Never Used  Substance and Sexual Activity  . Alcohol use: No  . Drug use: No  . Sexual activity: Not Currently    Birth control/protection: None  Other Topics Concern  . Not on file  Social History Narrative   Lives at home with children   Caffeine - 10/27/16 "addicted to [employer], drank all day long", currently Mtn Dew 12 oz/week   Social Determinants of Health    Financial Resource Strain:   . Difficulty of Paying Living Expenses: Not on file  Food Insecurity:   . Worried About Charity fundraiser in the Last Year: Not on file  . Ran Out of Food in the Last Year: Not on file  Transportation Needs:   . Lack of Transportation (Medical): Not on file  . Lack of Transportation (Non-Medical): Not on file  Physical Activity:   . Days of Exercise per Week: Not on file  . Minutes of Exercise per Session: Not on file  Stress:   . Feeling of Stress : Not on file  Social Connections:   . Frequency of Communication with Friends and Family: Not on file  . Frequency of Social Gatherings with Friends and Family: Not on file  . Attends Religious Services: Not on file  . Active Member of Clubs or Organizations: Not on file  . Attends Archivist Meetings: Not on file  . Marital Status: Not on file     Review of Systems: A 12 point ROS discussed and pertinent positives are indicated in the HPI above.  All other systems are negative.  Review of Systems  Vital Signs: BP 110/81 (BP Location: Left Arm)   Pulse (!) 104   Temp 98 F (36.7 C) (Oral)   Resp 18   Ht 5\' 7"  (1.702 m)   Wt 64.9 kg   LMP 05/05/2020   SpO2 99%   BMI 22.40 kg/m   Physical Exam Vitals reviewed.  Constitutional:      Appearance: Normal appearance.  HENT:     Head: Normocephalic and atraumatic.  Eyes:     Extraocular Movements: Extraocular movements intact.  Cardiovascular:     Rate and Rhythm: Regular rhythm. Tachycardia present.  Pulmonary:     Effort: Pulmonary effort is normal. No respiratory distress.     Breath sounds: Normal breath sounds.  Abdominal:     General: There is no distension.     Palpations: Abdomen is soft.     Tenderness: There is no abdominal tenderness.  Musculoskeletal:        General: Normal range of motion.  Skin:    General: Skin is warm and dry.  Neurological:     General: No focal deficit present.     Mental Status: She is  alert and oriented to person, place, and time.  Psychiatric:        Mood and Affect: Mood normal.        Behavior: Behavior normal.        Thought Content: Thought content normal.        Judgment: Judgment normal.     Imaging: CT ABDOMEN PELVIS WO CONTRAST  Result Date: 05/16/2020 CLINICAL DATA:  Bilious emesis, renal transplant EXAM: CT ABDOMEN AND PELVIS WITHOUT CONTRAST TECHNIQUE: Multidetector CT imaging of the abdomen  and pelvis was performed following the standard protocol without IV contrast. COMPARISON:  None. FINDINGS: Lower chest: No acute abnormality. Hepatobiliary: No focal liver abnormality is seen. No gallstones, gallbladder wall thickening, or biliary dilatation. Pancreas: Unremarkable Spleen: Unremarkable Adrenals/Urinary Tract: The adrenal glands are unremarkable. The native kidneys are markedly atrophic in keeping with changes of chronic renal failure. Previously noted right transplant kidney has been surgically removed and a residual partially calcified complex collection is seen within the resection bed likely representing a postoperative complex seroma or chronic hematoma. A new transplant kidney is seen within the left iliac fossa and is normal in size and demonstrates normal cortical thickness. There is no hydronephrosis. The bladder is unremarkable. Stomach/Bowel: Stomach, small bowel, and large bowel are unremarkable. Appendix normal. No free intraperitoneal gas or fluid. Vascular/Lymphatic: Since the prior examination, there has developed extensive pathologic adenopathy within the left periaortic, aortocaval, and retrocaval lymph node groups, as well as the bilateral common iliac, left external and bilateral inguinal lymph node groups. Bulky adenopathy is compatible with lymphoma. Index lymph node anterior to the abdominal aorta at axial image # 37/3 measures 2.4 x 4.2 cm in greatest dimension. Moderate aortoiliac atherosclerotic calcification is present without evidence of  aneurysm. Reproductive: Uterus and bilateral adnexa are unremarkable. Other: Rectum unremarkable. Musculoskeletal: Bone island within the right ischium. No lytic or blastic bone lesions are seen. IMPRESSION: Interval development of extensive retroperitoneal, pelvic, and inguinal lymphadenopathy most in keeping with lymphoma in this immunocompromised patient. Left inguinal and external iliac lymphadenopathy should be easily amenable to ultrasound-guided biopsy for further evaluation. Interval explantation of right renal transplant and implantation of new transplant kidney within the left iliac fossa. Aortic Atherosclerosis (ICD10-I70.0). Electronically Signed   By: Fidela Salisbury MD   On: 05/16/2020 05:49   CT Angio Chest PE W and/or Wo Contrast  Result Date: 05/27/2020 CLINICAL DATA:  PE suspected, high probability EXAM: CT ANGIOGRAPHY CHEST WITH CONTRAST TECHNIQUE: Multidetector CT imaging of the chest was performed using the standard protocol during bolus administration of intravenous contrast. Multiplanar CT image reconstructions and MIPs were obtained to evaluate the vascular anatomy. CONTRAST:  106mL OMNIPAQUE IOHEXOL 350 MG/ML SOLN COMPARISON:  None. FINDINGS: Cardiovascular: Satisfactory opacification of the pulmonary arteries to the segmental level. No evidence of pulmonary embolism. Normal heart size. No pericardial effusion. Mediastinum/Nodes: Retrocrural lymphadenopathy, reference abdominal CT. Lungs/Pleura: 2 mm peripheral right upper lobe pulmonary nodule, attention on follow-up. There is no edema, consolidation, effusion, or pneumothorax. Upper Abdomen: Reported on dedicated study. Musculoskeletal: No acute or aggressive finding. Review of the MIP images confirms the above findings. IMPRESSION: Negative for pulmonary embolism or other acute finding. Electronically Signed   By: Monte Fantasia M.D.   On: 05/27/2020 06:02   CT ABDOMEN PELVIS W CONTRAST  Result Date: 05/27/2020 CLINICAL DATA:   Abdominal distension.  Adenopathy. EXAM: CT ABDOMEN AND PELVIS WITH CONTRAST TECHNIQUE: Multidetector CT imaging of the abdomen and pelvis was performed using the standard protocol following bolus administration of intravenous contrast. CONTRAST:  35mL OMNIPAQUE IOHEXOL 350 MG/ML SOLN COMPARISON:  Noncontrast abdominal CT 05/16/2020 FINDINGS: Lower chest:  No contributory findings. Hepatobiliary: Multiple ill-defined masses within the liver. One of the largest is in the subcapsular inferior right lobe on 5:33 at 2 cm.No evidence of biliary obstruction or stone. Pancreas: Unremarkable. Spleen: Unremarkable. Adrenals/Urinary Tract: Negative adrenals. Native renal atrophy. Solid masslike appearance at the left renal hilum which measures 2.2 cm. There is a transplant kidney in the left lower quadrant with scarring  involving the left-sided pole. There is pelviectasis without overt hydronephrosis. Failed transplant in the right lower quadrant. Unremarkable bladder. Stomach/Bowel: No obstruction. No inflammatory bowel wall thickening. Vascular/Lymphatic: Bulky retroperitoneal lymphadenopathy. The nodes show areas of low-density and calcification, which would be heterogeneous for untreated lymphoma. For measurement purposes a node ventral to the aorta and cava on 5:44 measures 42 x 26 mm. There is also bilateral inguinal lymphadenopathy including a left inguinal node measuring 16 mm long axis. Adenopathy continues to the retrocural station. Atherosclerotic calcification, premature for age. Reproductive:Intramural fibroids measuring up to 19 mm anteriorly. Other: No ascites or pneumoperitoneum. Musculoskeletal: No acute abnormalities. IMPRESSION: 1. Redemonstrated inguinal and intra-abdominal lymphadenopathy. With the benefit of contrast, multiple liver lesions are also seen. Findings could reflect metastatic disease, lymphoma, or posttransplant lymphoproliferative disease. Left inguinal nodes are the most amenable to  excisional biopsy. 2. 2.2 cm native left lower pole renal mass which may be related to#1. 3. Left lower quadrant transplant kidney with areas of cortical scarring. No hydronephrosis or perinephric collection. Electronically Signed   By: Monte Fantasia M.D.   On: 05/27/2020 06:16   DG Chest Port 1 View  Result Date: 05/26/2020 CLINICAL DATA:  Chest pain and vomiting EXAM: PORTABLE CHEST 1 VIEW COMPARISON:  Chest x-ray 02/03/2015. FINDINGS: The heart size and mediastinal contours are within normal limits. No focal consolidation. No pulmonary edema. No pleural effusion. No pneumothorax. No acute osseous abnormality. IMPRESSION: No active disease. Electronically Signed   By: Iven Finn M.D.   On: 05/26/2020 23:40   [place] CORE BIOPSY (LYMPH NODES)  Result Date: 05/27/2020 INDICATION: History of previous renal transplantation (x2), now with bulky retroperitoneal, pelvic and bilateral inguinal lymphadenopathy as well as indeterminate liver lesions. Please from ultrasound-guided left inguinal lymph node biopsy for tissue diagnostic purposes. EXAM: ULTRASOUND-GUIDED LEFT INGUINAL LYMPH NODE BIOPSY COMPARISON:  CT of the chest, abdomen and pelvis - 05/27/2020 MEDICATIONS: None ANESTHESIA/SEDATION: Moderate (conscious) sedation was employed during this procedure. A total of Versed 1 mg and Fentanyl 50 mcg was administered intravenously. Moderate Sedation Time: 10 minutes. The patient's level of consciousness and vital signs were monitored continuously by radiology nursing throughout the procedure under my direct supervision. COMPLICATIONS: None immediate. TECHNIQUE: Informed written consent was obtained from the patient after a discussion of the risks, benefits and alternatives to treatment. Questions regarding the procedure were encouraged and answered. Initial ultrasound scanning demonstrated an approximately 1.7 x 1.7 x 1.6 cm hypoechoic lymph node within the left groin, correlating with dominant left inguinal  lymph node seen on preceding abdominal CT image 85, series 5. an ultrasound image was saved for documentation purposes. The procedure was planned. A timeout was performed prior to the initiation of the procedure. The operative was prepped and draped in the usual sterile fashion, and a sterile drape was applied covering the operative field. A timeout was performed prior to the initiation of the procedure. Local anesthesia was provided with 1% lidocaine with epinephrine. Under direct ultrasound guidance, an 18 gauge core needle device was utilized to obtain to obtain 8 core needle biopsies of the dominant indeterminate left inguinal lymph node. The samples were placed in saline and submitted to pathology. The needle was removed and hemostasis was achieved with manual compression. Post procedure scan was negative for significant hematoma. A dressing was placed. The patient tolerated the procedure well without immediate postprocedural complication. IMPRESSION: Technically successful ultrasound guided biopsy of dominant indeterminate left inguinal lymph node. Electronically Signed   By: Eldridge Abrahams.D.  On: 05/27/2020 13:59   US PELVIC COMPLETE WITH TRANSVAGINAL  Result Date: 05/30/2020 CLINICAL DATA:  Metastatic cancer unknown primary EXAM: TRANSABDOMINAL AND TRANSVAGINAL ULTRASOUND OF PELVIS TECHNIQUE: Both transabdominal and transvaginal ultrasound examinations of the pelvis were performed. Transabdominal technique was performed for global imaging of the pelvis including uterus, ovaries, adnexal regions, and pelvic cul-de-sac. It was necessary to proceed with endovaginal exam following the transabdominal exam to visualize the uterus endometrium ovaries. COMPARISON:  CT 05/27/2020 FINDINGS: Uterus Measurements: 10.1 x 6.1 x 5.4 cm = volume: 172.5 mL. Nabothian cysts in the cervix. Left posterior myometrial mass measuring 1.4 x 1.8 x 1.1 cm. Right anterior intramural fundal mass measuring 1.6 x 2.1 x 1.6 cm.  Endometrium Thickness: 6 mm.  No focal abnormality visualized. Right ovary Measurements: 1.5 x 1.8 x 2.1 cm = volume: 3.1 mL. Normal appearance/no adnexal mass. Left ovary Measurements: 1.4 x 1.8 x 1.8 cm = volume: 2.4 mL. Normal appearance/no adnexal mass. Other findings Moderate free fluid in the pelvis. IMPRESSION: 1. Negative for ovarian mass lesion. 2. Uterine fibroids 3. Moderate free fluid in the pelvis Electronically Signed   By: Donavan Foil M.D.   On: 05/30/2020 21:51    Labs:  CBC: Recent Labs    05/28/20 0128 05/29/20 0106 05/30/20 0041 05/31/20 0213  WBC 6.9 5.8 5.7 5.9  HGB 9.6* 9.5* 9.1* 9.2*  HCT 31.5* 31.3* 29.6* 30.2*  PLT 247 235 225 251    COAGS: Recent Labs    05/31/20 0911  INR 1.2    BMP: Recent Labs    05/15/20 1743 05/15/20 1743 05/27/20 0113 05/27/20 0113 05/27/20 1058 05/28/20 0128 05/29/20 0106 05/30/20 0041 05/31/20 0213  NA 134*   < > 137   < >  --  136 136 138 137  K 3.6   < > 4.4   < >  --  4.0 3.8 4.1 3.8  CL 97*   < > 107   < >  --  105 107 110 107  CO2 21*   < > 17*   < >  --  14* 20* 21* 19*  GLUCOSE 134*   < > 96   < >  --  97 133* 123* 103*  BUN 19   < > 17   < >  --  10 9 6 9   CALCIUM 9.5   < > 8.0*   < >  --  8.3* 8.6* 8.6* 8.8*  CREATININE 1.54*   < > 1.13*   < > 1.19* 1.29* 1.02* 0.99 1.04*  GFRNONAA 41*   < > 59*   < > 56* 51* >60 >60 >60  GFRAA 47*  --  >60  --  >60 59*  --   --   --    < > = values in this interval not displayed.    LIVER FUNCTION TESTS: Recent Labs    05/28/20 0128 05/29/20 0106 05/30/20 0041 05/31/20 0213  BILITOT 2.8* 2.4* 2.0* 2.1*  AST 232* 200* 144* 109*  ALT 106* 104* 96* 85*  ALKPHOS 265* 334* 304* 298*  PROT 5.6* 5.9* 5.5* 5.4*  ALBUMIN 2.1* 2.1* 2.0* 2.0*    TUMOR MARKERS: No results for input(s): AFPTM, CEA, CA199, CHROMGRNA in the last 8760 hours.  Assessment and Plan: Left renal mass, liver lesions, lymphadenopathy LN bx c/w carcinoma but unknown primary. Imaging reviewed  with Dr. Laurence Ferrari, feel left renal mass is very high risk for bleeding given central hilum location in a very  atrophied kidney. Feel liver lesions amenable to biopsy with US guidance. Labs reviewed. Risks and benefits of liver biopsy was discussed with the patient and/or patient's family including, but not limited to bleeding, infection, damage to adjacent structures or low yield requiring additional tests.  All of the questions were answered and there is agreement to proceed.  Consent signed and in chart.    Electronically Signed: Ascencion Dike, PA-C   05/31/2020, 10:44 AM      I spent a total of 20 Minutes in face to face in clinical consultation, greater than 50% of which was counseling/coordinating care for liver lesion biopsy.

## 2020-05-31 NOTE — Progress Notes (Signed)
[facility] Progress Note   Subjective:   Feeling ok.  No new issues.  Going for CT guided biopsy of L renal mass today.   Objective Vitals:   05/30/20 1355 05/30/20 2057 05/31/20 0553 05/31/20 0630  BP: 114/88 (!) 132/95 100/79 110/81  Pulse: (!) 106 (!) 110 (!) 102 (!) 104  Resp: 18 20 17 18   Temp: 98.1 F (36.7 C) 97.7 F (36.5 C) 98 F (36.7 C) 98 F (36.7 C)  TempSrc: Oral Oral Oral Oral  SpO2: 99% 98% 100% 99%  Weight:      Height:       Physical Exam General: appears well, resting in bed Heart: tachy, regular Lungs: normal WOB Abdomen: RLQ allograft nontender Extremities: no edema Dialysis Access: R FA AVF +t/b  Additional Objective Labs: Basic Metabolic Panel: Recent Labs  Lab 05/29/20 0106 05/30/20 0041 05/31/20 0213  NA 136 138 137  K 3.8 4.1 3.8  CL 107 110 107  CO2 20* 21* 19*  GLUCOSE 133* 123* 103*  BUN 9 6 9   CREATININE 1.02* 0.99 1.04*  CALCIUM 8.6* 8.6* 8.8*   Liver Function Tests: Recent Labs  Lab 05/29/20 0106 05/30/20 0041 05/31/20 0213  AST 200* 144* 109*  ALT 104* 96* 85*  ALKPHOS 334* 304* 298*  BILITOT 2.4* 2.0* 2.1*  PROT 5.9* 5.5* 5.4*  ALBUMIN 2.1* 2.0* 2.0*   Recent Labs  Lab 05/27/20 0113  LIPASE 35   CBC: Recent Labs  Lab 05/27/20 1058 05/27/20 1058 05/27/20 2244 05/27/20 2244 05/28/20 0128 05/28/20 0128 05/29/20 0106 05/30/20 0041 05/31/20 0213  WBC 7.9   < > 7.6   < > 6.9   < > 5.8 5.7 5.9  NEUTROABS 5.6  --   --   --   --   --   --   --   --   HGB 10.9*   < > 10.6*   < > 9.6*   < > 9.5* 9.1* 9.2*  HCT 36.6   < > 35.4*   < > 31.5*   < > 31.3* 29.6* 30.2*  MCV 82.4   < > 82.9  --  82.0  --  80.9 80.7 82.3  PLT 350   < > 299   < > 247   < > 235 225 251   < > = values in this interval not displayed.   Blood Culture    Component Value Date/Time   SDES BLOOD LEFT ARM 05/28/2020 1254   SPECREQUEST  05/28/2020 1254    BOTTLES DRAWN AEROBIC AND ANAEROBIC Blood Culture adequate volume    CULT  05/28/2020 1254    NO GROWTH 3 DAYS Performed at [facility], [place] [address] [place], [place] [address]    REPTSTATUS PENDING 05/28/2020 1254    Cardiac Enzymes: No results for input(s): CKTOTAL, CKMB, CKMBINDEX, TROPONINI in the last 168 hours. CBG: No results for input(s): GLUCAP in the last 168 hours. Iron Studies:  No results for input(s): IRON, TIBC, TRANSFERRIN, FERRITIN in the last 72 hours. @lablastinr3 @ Studies/Results: US PELVIC COMPLETE WITH TRANSVAGINAL  Result Date: 05/30/2020 CLINICAL DATA:  Metastatic cancer unknown primary EXAM: TRANSABDOMINAL AND TRANSVAGINAL ULTRASOUND OF PELVIS TECHNIQUE: Both transabdominal and transvaginal ultrasound examinations of the pelvis were performed. Transabdominal technique was performed for global imaging of the pelvis including uterus, ovaries, adnexal regions, and pelvic cul-de-sac. It was necessary to proceed with endovaginal exam following the transabdominal exam to visualize the uterus endometrium ovaries. COMPARISON:  CT  05/27/2020 FINDINGS: Uterus Measurements: 10.1 x 6.1 x 5.4 cm = volume: 172.5 mL. Nabothian cysts in the cervix. Left posterior myometrial mass measuring 1.4 x 1.8 x 1.1 cm. Right anterior intramural fundal mass measuring 1.6 x 2.1 x 1.6 cm. Endometrium Thickness: 6 mm.  No focal abnormality visualized. Right ovary Measurements: 1.5 x 1.8 x 2.1 cm = volume: 3.1 mL. Normal appearance/no adnexal mass. Left ovary Measurements: 1.4 x 1.8 x 1.8 cm = volume: 2.4 mL. Normal appearance/no adnexal mass. Other findings Moderate free fluid in the pelvis. IMPRESSION: 1. Negative for ovarian mass lesion. 2. Uterine fibroids 3. Moderate free fluid in the pelvis Electronically Signed   By: Donavan Foil M.D.   On: 05/30/2020 21:51   Medications:  . fluconazole  100 mg Oral Daily  . mycophenolate  500 mg Oral BID  . pantoprazole  40 mg Oral Daily  . polyethylene glycol  17 g Oral BID  . predniSONE  5 mg Oral Q breakfast   . senna  1 tablet Oral Daily  . sorbitol, milk of mag, mineral oil, glycerin (SMOG) enema  960 mL Rectal Once  . Tacrolimus ER  4 mg Oral Daily    Assessment/Plan **Adenopathy, liver lesions, renal mass:  biopsy showing carcinoma with additional stains pending.  EBV PCR neg. Tumor markers unrevealing to date.  Renal mass biopsy today. Heme/onc following  **s/p renal transplant:  2nd transplant at [facility] 2019 --> renal function with mild AKI on admission now back to baseline. Volume depletion, anemia and contrast all likely playing role in AKI.   UA bland.  On home immunosuppression with cellcept 500 BID,pred 5 daily and Envarsus 4 daily. Prograf trough pending from 10/7(on invarsus daily).    **Anemia:  Improved s/p 2u pRBC; has h/o menorrhagia.  Ferritin is 6100, total iron 27, iron sat 15%; MCV low normal.  Per hematology.   **AGMA: transiently noted on admission, lactate normal; note ketones in UA day prior, pt with mild AKI at that time --> suspect it was a combo of those factors.  AG normalized today and serum bicarb up to 20 c/w NAGMA likely related to NS infusion.  Prograf can also cause type 4 RTA and this could be contributory. Cont to monitor and if stays low can supplement.  Will follow peripherally for now - page with questions, concerns.   Jannifer Hick MD 05/31/2020, 9:09 AM  [facility] Pager: [phone]

## 2020-05-31 NOTE — Progress Notes (Signed)
Oncology short note  US pelvis done : - no evidence of gyn primary IMPRESSION: 1. Negative for ovarian mass lesion. 2. Uterine fibroids 3. Moderate free fluid in the pelvis   Electronically Signed   By: Donavan Foil M.D.   On: 05/30/2020 21:51  A Metastatic carcinoma of unknown primary-- possible renal primary Plan -discussed with IR -- biopsy of left renal mass not safely possible due to proximity of mass to renal hilum and risk of bleeding. Decided on alternative biopsy of lesion in liver. -discussed with Dr Cannacci/pathology-- will sent 1st biopsy sample to biotheranostics for gene profiling to determine tissue of origin. -if rpt biopsy/ tissue of origin analysis suggest RCC -- would plan to rx with cabozantinib as 1st line treatment and there might need potentially be a role of resection of possible primary tumor in left native kidney. -if TOO is still indeterminate would rx with empiric CUP chemotherapy regimen like carboplatin/taxol. -defer to nephrology regarding consideration/ability to weaning down immune suppression. -discussed with patient yesterday that the carcinoma is metastatic/stage IV. All treatments would be palliative and not curative. She was understandably emotional and lorazepam prn was ordered if needed. He ex spouse was on the phone during this discussion based on her preference. - we shall set her up for f/u in cancer clinic next week.  Sullivan Lone MD MS

## 2020-06-01 DIAGNOSIS — R160 Hepatomegaly, not elsewhere classified: Secondary | ICD-10-CM

## 2020-06-01 DIAGNOSIS — C787 Secondary malignant neoplasm of liver and intrahepatic bile duct: Secondary | ICD-10-CM | POA: Diagnosis not present

## 2020-06-01 DIAGNOSIS — N2889 Other specified disorders of kidney and ureter: Secondary | ICD-10-CM | POA: Diagnosis not present

## 2020-06-01 LAB — CBC
HCT: 29.3 % — ABNORMAL LOW (ref 36.0–46.0)
Hemoglobin: 8.8 g/dL — ABNORMAL LOW (ref 12.0–15.0)
MCH: 24.6 pg — ABNORMAL LOW (ref 26.0–34.0)
MCHC: 30 g/dL (ref 30.0–36.0)
MCV: 81.8 fL (ref 80.0–100.0)
Platelets: 236 K/uL (ref 150–400)
RBC: 3.58 MIL/uL — ABNORMAL LOW (ref 3.87–5.11)
RDW: 16.1 % — ABNORMAL HIGH (ref 11.5–15.5)
WBC: 4.8 K/uL (ref 4.0–10.5)
nRBC: 0 % (ref 0.0–0.2)

## 2020-06-01 LAB — COMPREHENSIVE METABOLIC PANEL WITH GFR
ALT: 69 U/L — ABNORMAL HIGH (ref 0–44)
AST: 80 U/L — ABNORMAL HIGH (ref 15–41)
Albumin: 1.9 g/dL — ABNORMAL LOW (ref 3.5–5.0)
Alkaline Phosphatase: 287 U/L — ABNORMAL HIGH (ref 38–126)
Anion gap: 11 (ref 5–15)
BUN: 10 mg/dL (ref 6–20)
CO2: 20 mmol/L — ABNORMAL LOW (ref 22–32)
Calcium: 8.7 mg/dL — ABNORMAL LOW (ref 8.9–10.3)
Chloride: 106 mmol/L (ref 98–111)
Creatinine, Ser: 1.09 mg/dL — ABNORMAL HIGH (ref 0.44–1.00)
GFR, Estimated: >60 mL/min
Glucose, Bld: 95 mg/dL (ref 70–99)
Potassium: 3.9 mmol/L (ref 3.5–5.1)
Sodium: 137 mmol/L (ref 135–145)
Total Bilirubin: 2.3 mg/dL — ABNORMAL HIGH (ref 0.3–1.2)
Total Protein: 5.3 g/dL — ABNORMAL LOW (ref 6.5–8.1)

## 2020-06-01 MED ORDER — FLUCONAZOLE 100 MG PO TABS
100.0000 mg | ORAL_TABLET | Freq: Every day | ORAL | 0 refills | Status: DC
Start: 2020-06-02 — End: 2020-07-16

## 2020-06-01 MED ORDER — ONDANSETRON 8 MG PO TBDP: 8.0000 mg | ORAL_TABLET | Freq: Three times a day (TID) | ORAL | 0 refills | Status: DC | PRN

## 2020-06-01 MED ORDER — LORAZEPAM 0.5 MG PO TABS: 0.5000 mg | ORAL_TABLET | Freq: Three times a day (TID) | ORAL | 0 refills | Status: DC | PRN

## 2020-06-01 MED ORDER — OXYCODONE HCL 5 MG PO TABS
5.0000 mg | ORAL_TABLET | Freq: Four times a day (QID) | ORAL | 0 refills | Status: DC | PRN
Start: 2020-06-01 — End: 2020-06-03

## 2020-06-01 MED ORDER — OXYCODONE HCL 5 MG PO TABS
5.0000 mg | ORAL_TABLET | ORAL | Status: DC | PRN
  Administered 2020-06-01: 5 mg via ORAL
  Filled 2020-06-01: qty 1

## 2020-06-01 NOTE — Discharge Instructions (Signed)
Dear Flonnie Hailstone,   Thank you so much for allowing [place] to be part of your care!  You were admitted to [facility] for chest pain, shortness of breath and found to have anemia and lymphadenopathy secondary to carcinoma.    POST-HOSPITAL & CARE INSTRUCTIONS  1. You have an oncology appointment scheduled on 10/11 at 8:40am with GI, 11am with [facility], and 10/18 at 2pm with Dr. Lorenso Courier 2. Please let PCP/Specialists know of any changes that were made.  3. Please see medications section of this packet for any medication changes.   DOCTOR'S APPOINTMENT & FOLLOW UP CARE INSTRUCTIONS  Future Appointments  Date Time Provider [place]  06/03/2020  8:40 AM Doran Stabler, MD [facility]  06/03/2020 11:00 AM Delora Fuel, MD [facility]  06/06/2020  2:40 PM Brunetta Genera, MD [facility] None  06/11/2020  8:35 AM Constant, Vickii Chafe, MD [facility]    RETURN PRECAUTIONS: Return if you develo   Take care and be well!  [facility]  [place], [place] [address] [phone]

## 2020-06-01 NOTE — Progress Notes (Signed)
Patient discharged from unit via wheelchair and staff supervision.

## 2020-06-01 NOTE — Progress Notes (Signed)
Family Medicine Teaching Service Daily Progress Note Intern Pager: [phone]  Patient name: Candace Griffin Medical record number: [id number] Date of birth: 07-Oct-1975 Age: 44 y.o. Gender: female  Primary Care Provider: Richarda Osmond, DO Consultants: IR, oncology, nephrology  Code Status: FULL  Pt Overview and Major Events to Date:  Admitted 10/4 LN biopsy 10/4 Liver biopsy 10/8  Assessment and Plan: Candace Griffin a 44 y.o.femalepresenting with CP, SOB. PMH is significant fors/p renal transplant, anemia, GERD, HTN, menorrhagia   Lymphadenopathy c/w Carcinoma  S/p US guided inguinal lymph node biopsy 10/4 and now liver biopsy on 10/8  - f/u biopsy results - preliminary path results show carcinoma but staining pattern is non specific. New stains pending. Will await final results  - Switching from Dilaudid 0.5 mg q 4 hours prn to po oxycodone 5mg  q 4 hours prn  - US pelvis negative for ovarian mass lesion  - s/p liver biopsy since kidney biopsy was too high risk. F/u results -  RUQ ultrasound showed trace perihepatic free fluid and hepatic lesions   Anemia  Current Hb stable at 9.2. Transfusion threshold 8. Tachycardia improving, anemia likely due to chronic disease as well as newly diagnosed carcinoma.   Stomach Pain 2/2 extensive lymphadenopathy likely due to carcinoma. Tender to light palpation particularly on right side on exam  - Dilaudid 0.5mg  q 4 hrs. Transition to po oxycodone before discharge  Fever, resolved Patient spiked fever (Tmax 101.3) on 10/5. Likely due to carcinoma vs infection. UA negative for infection on admission. Blood culture NGTD. Urine culture pending.  - f/u cultures   S/p renal transplant Follows up regularly with nephrology at [facility]. Home meds include mycophenolate and tacrolimus, prednisone. Cr 1.13 on admission. Updated [facility] on patient's status. Nephrology following, appreciate recs.  - continue immunosuppressants, nephro to  adjust as needed  - renal functin panel at follow up   Constipation, chronic Chronic and worsening for the past 5 months. Patient able to have a good bowel movement on 10/6. - Enema  - PO laxitives - suppositories PRN  Thrush Immunocompromised patient with throat pain x2 weeks and white plaque on tongue and pharynx. H/o thrush. - fluconazole 100mg  daily. likely 2 week tx, QTc acceptable  - if not improving, can consider EGD with biopsy  FEN/GI: Regular diet PPx: SCD due to patient refusing Lovenox injections   Disposition: Home today   Subjective:   Patient feels well. Reports urinary symptoms improved. Reports good urine output.   Objective: Temp:  [97.7 F (36.5 C)-98.9 F (37.2 C)] 98.9 F (37.2 C) (10/09 1413) Pulse Rate:  [98-109] 98 (10/09 1413) Resp:  [16-17] 17 (10/09 1413) BP: (113-117)/(81-90) 117/90 (10/09 1413) SpO2:  [98 %-100 %] 100 % (10/09 1413) Physical Exam: General: Pleasant, calm Cardiovascular: RRR Respiratory: Coarse bilateral breath sounds Abdomen: Distended, tender in bilateral lower quadrant Extremities: No edema   Laboratory: Recent Labs  Lab 05/30/20 0041 05/31/20 0213 06/01/20 0143  WBC 5.7 5.9 4.8  HGB 9.1* 9.2* 8.8*  HCT 29.6* 30.2* 29.3*  PLT 225 251 236   Recent Labs  Lab 05/30/20 0041 05/31/20 0213 06/01/20 0143  NA 138 137 137  K 4.1 3.8 3.9  CL 110 107 106  CO2 21* 19* 20*  BUN 6 9 10   CREATININE 0.99 1.04* 1.09*  CALCIUM 8.6* 8.8* 8.7*  PROT 5.5* 5.4* 5.3*  BILITOT 2.0* 2.1* 2.3*  ALKPHOS 304* 298* 287*  ALT 96* 85* 69*  AST 144* 109* 80*  GLUCOSE 123* 103* 95     Imaging/Diagnostic Tests: Reviewed RUQ ultrasound.   Dorris Singh, MD  Family Medicine Teaching Service

## 2020-06-01 NOTE — Progress Notes (Signed)
Discharge instructions reviewed with patient at bedside.  Patient verbalized understanding and declined further instructions.  Patient awaiting arrival of transportation for discharge, will call when ready to leave.

## 2020-06-02 LAB — CULTURE, BLOOD (ROUTINE X 2)
Culture: NO GROWTH
Culture: NO GROWTH
Special Requests: ADEQUATE
Special Requests: ADEQUATE

## 2020-06-02 LAB — URINE CULTURE

## 2020-06-02 LAB — TACROLIMUS LEVEL: Tacrolimus (FK506) - LabCorp: 17.1 ng/mL (ref 2.0–20.0)

## 2020-06-02 NOTE — Discharge Summary (Addendum)
[facility] Discharge Summary  Patient name: Candace Griffin Medical record number: [id number] Date of birth: Nov 02, 1975 Age: 44 y.o. Gender: female Date of Admission: 05/26/2020  Date of Discharge: 06/01/20 Admitting Physician: Leeanne Rio, MD  Primary Care Provider: Richarda Osmond, DO Consultants: IR, oncology, nephrology  Indication for Hospitalization: Symptomatic anemia   Discharge Diagnoses/Problem List:  Symptomatic anemia Tachycardia Lymphadenopathy Carcinoma Renal mass Liver mass   Disposition: Home  Discharge Condition: Stable, with further work up to be pursed outpatient   Discharge Exam:  BP 117/90 (BP Location: Left Arm)   Pulse 98   Temp 98.9 F (37.2 C) (Oral)   Resp 17   Ht 5\' 7"  (1.702 m)   Wt 64.9 kg   LMP 05/05/2020   SpO2 100%   BMI 22.40 kg/m  General: Pleasant, NAD Cardiovascular: RRR no murmurs  Respiratory: CTAB. Normal WOB Abdomen: Soft, tender to palpation diffusely Extremities: warm, well perfused. No edema. Distal pulses 2+  Brief Hospital Course:   ALVENA KIERNAN a 44 y.o.female whopresented with CP, SOB and was found to have symptomatic anemia, lymphadenopathy, liver and renal lesions consistent with carcinoma. PMH is significant fors/p renal transplant, anemia, GERD, HTN, menorrhagia   Symptomatic anemia Patient presented with chest pain, shortness of breath and tachycardia. She was found to have a hgb of 4 and received 2 units pRBCs. Hgb increased to 10.9. Patient received fluid bolus and was on maintenance IVFs. Thought to be due to anemia of chronic disease and her newly diagnosed hepatic carcinoma. Low iron with highly elevated ferritin.  Normal haptoglobin level. Patient's hgb remained stable throughout her hospitalization  Lymphadenopathy c/w carcinoma CT on 9/23 and on admission showed extensive lymphadenopathy. She also had multiple liver lesions and a renal mass on her  native kidney. CMP showed elevated liver enzymes: AST 336, ALT 105.  Heme onc followed patient. US guided inguinal lymph node biopsy was performed on 10/4. Preliminary path results showed stage 4 carcinoma. Pelvic US negative for ovarian mass lesion. RUQ ultrasound showed trace perihepatic free fluid and hepatic lesions. IR consulted for renal biopsy but procedure deemed too risky, so liver was biopsied instead on 10/8. Marland Kitchen Patient was discharged with outpatient follow up and treatment coordinated with oncology.   Stomach pain Patient experienced diffuse stomach pain which she reports has been experiencing for the past 3 weeks. She was tender to light palpation on exam. This is likely secondary to extensive lymphadenopathy due to carcinoma. She was on dilaudid 0.5mg  every 4 hours for her pain as needed which provided some relief. She was transitioned to oral oxycodone 4mg  every 4 hours as needed prior to discharge.   Fever  Patient spiked a fever with a Tmax of 101.3 on 10/5 that resolved with Tylenol. UA negative for infection on admission, and blood and urine cultures showed no growth by the time of discharge. Thought to be 2/2 malignancy  S/p renal transplant Patient had a renal transplant 2 years ago and follows up regularly with [facility]. Cr on admission was 1.13. She maintained on her home immunosuppressant medications including mycophenolate, tacrolimus, and prednisone. Nephrology followed patient. Patient to see [facility] nephrology outpatient for follow up.   Constipation Patient presented with chronic and worsening constipation for the past 5 months. While hospitalized she was on enema, PO laxatives and suppositories as needed and was able to have a good bowel movement.   Thrush Patient presented with throat pain for 2 weeks and white  plaque on her tongue and pharynx. Immunocompromised with a history of thrush. She took fluconazole 100mg  daily to be taken for a total of 2 weeks. Patient to  consider seeing GI outpatient for an EGD if no improvement.    Issues for Follow Up:  1. F/u with PCP 10/11 at 11am  2. F/u with Oncology for continued evaluation of malignancy 3. Consider f/u with GI for GERD, n/v and weight loss 4. F/u with [facility] for ongoing monitoring of what is possibly a Renal cell carcinoma.  5. Assess if pt continuing to take fluconazole.  6. Assess for continued constipation.  Discuss tx with miralax and/or fiber.   7. Discuss w/ pt her goals of care going forward given her recent diagnosis of metastatic cancer.  Would benefit from outpatient palliative consult when she is ready.  8. Continue medications below   Significant Procedures:  Inguinal node biopsy Liver biopsy  Significant Labs and Imaging:  Recent Labs  Lab 05/30/20 0041 05/31/20 0213 06/01/20 0143  WBC 5.7 5.9 4.8  HGB 9.1* 9.2* 8.8*  HCT 29.6* 30.2* 29.3*  PLT 225 251 236   Recent Labs  Lab 05/28/20 0128 05/28/20 0128 05/29/20 0106 05/29/20 0106 05/30/20 0041 05/30/20 0041 05/31/20 0213 06/01/20 0143  NA 136  --  136  --  138  --  137 137  K 4.0   < > 3.8   < > 4.1   < > 3.8 3.9  CL 105  --  107  --  110  --  107 106  CO2 14*  --  20*  --  21*  --  19* 20*  GLUCOSE 97  --  133*  --  123*  --  103* 95  BUN 10  --  9  --  6  --  9 10  CREATININE 1.29*  --  1.02*  --  0.99  --  1.04* 1.09*  CALCIUM 8.3*  --  8.6*  --  8.6*  --  8.8* 8.7*  ALKPHOS 265*  --  334*  --  304*  --  298* 287*  AST 232*  --  200*  --  144*  --  109* 80*  ALT 106*  --  104*  --  96*  --  85* 69*  ALBUMIN 2.1*  --  2.1*  --  2.0*  --  2.0* 1.9*   < > = values in this interval not displayed.    Results/Tests Pending at Time of Discharge: biopsy of liver.    Discharge Medications:  Allergies as of 06/01/2020      Reactions   Lisinopril Cough   Other Rash   Causes welts also   Tape Rash, Other (See Comments)   Causes welts also      Medication List    STOP taking these medications    ondansetron 4 MG tablet Commonly known as: ZOFRAN   traMADol 50 MG tablet Commonly known as: ULTRAM     TAKE these medications   cholecalciferol 25 MCG (1000 UNIT) tablet Commonly known as: VITAMIN D3 Take 1,000 Units by mouth daily.   Envarsus XR 1 MG Tb24 Generic drug: Tacrolimus ER Take 4 mg by mouth daily.   fluconazole 100 MG tablet Commonly known as: DIFLUCAN Take 1 tablet (100 mg total) by mouth daily.   LORazepam 0.5 MG tablet Commonly known as: ATIVAN Take 1 tablet (0.5 mg total) by mouth every 8 (eight) hours as needed for anxiety.  mycophenolate 250 MG capsule Commonly known as: CELLCEPT Take 500 mg by mouth 2 (two) times daily.   omeprazole 20 MG capsule Commonly known as: PRILOSEC Take 20 mg by mouth 2 (two) times daily as needed (for heart burn).   ondansetron 8 MG disintegrating tablet Commonly known as: ZOFRAN-ODT Take 1 tablet (8 mg total) by mouth every 8 (eight) hours as needed for nausea or vomiting.   oxyCODONE 5 MG immediate release tablet Commonly known as: Oxy IR/ROXICODONE Take 1 tablet (5 mg total) by mouth every 6 (six) hours as needed for up to 4 days for moderate pain or severe pain.   pravastatin 20 MG tablet Commonly known as: PRAVACHOL Take 20 mg by mouth at bedtime.   predniSONE 5 MG tablet Commonly known as: DELTASONE Take 5 mg by mouth daily with breakfast.   senna 8.6 MG Tabs tablet Commonly known as: SENOKOT Take 1 tablet (8.6 mg total) by mouth daily as needed for moderate constipation.       Discharge Instructions: Please refer to Patient Instructions section of EMR for full details.  Patient was counseled important signs and symptoms that should prompt return to medical care, changes in medications, dietary instructions, activity restrictions, and follow up appointments.   Follow-Up Appointments:  Follow-up Information    Ouida Sills, Chelsey L, DO. Go on 06/03/2020.   Specialty: Family Medicine Why: Appt scheduled  Monday 10/11 at 11am  Contact information: [address] [place] [address] [place], Dudley, DO 06/02/2020, 2:47 PM PGY-1, [facility]  Resident Addendum I have separately seen and examined the patient.  I have discussed the findings and exam with the resident and agree with the above note.  I helped develop the management plan that is described in the resident's note and I agree with the content.    Addison Naegeli, MD PGY-3 [facility] residency program

## 2020-06-03 ENCOUNTER — Encounter: Payer: Self-pay | Admitting: Family Medicine

## 2020-06-03 ENCOUNTER — Ambulatory Visit (INDEPENDENT_AMBULATORY_CARE_PROVIDER_SITE_OTHER): Payer: Medicare Other | Admitting: Gastroenterology

## 2020-06-03 ENCOUNTER — Other Ambulatory Visit: Payer: Self-pay

## 2020-06-03 ENCOUNTER — Encounter: Payer: Self-pay | Admitting: Gastroenterology

## 2020-06-03 ENCOUNTER — Ambulatory Visit (INDEPENDENT_AMBULATORY_CARE_PROVIDER_SITE_OTHER): Payer: Medicare Other | Admitting: Family Medicine

## 2020-06-03 VITALS — BP 110/74 | HR 120 | Ht 67.0 in | Wt 149.8 lb

## 2020-06-03 DIAGNOSIS — R110 Nausea: Secondary | ICD-10-CM | POA: Diagnosis not present

## 2020-06-03 DIAGNOSIS — R1084 Generalized abdominal pain: Secondary | ICD-10-CM | POA: Diagnosis not present

## 2020-06-03 DIAGNOSIS — F411 Generalized anxiety disorder: Secondary | ICD-10-CM

## 2020-06-03 DIAGNOSIS — K5900 Constipation, unspecified: Secondary | ICD-10-CM

## 2020-06-03 DIAGNOSIS — R634 Abnormal weight loss: Secondary | ICD-10-CM

## 2020-06-03 DIAGNOSIS — C787 Secondary malignant neoplasm of liver and intrahepatic bile duct: Secondary | ICD-10-CM

## 2020-06-03 DIAGNOSIS — B370 Candidal stomatitis: Secondary | ICD-10-CM | POA: Diagnosis not present

## 2020-06-03 MED ORDER — OXYCODONE HCL 5 MG PO TABS: 10.0000 mg | ORAL_TABLET | ORAL | 0 refills | Status: AC | PRN

## 2020-06-03 MED ORDER — LORAZEPAM 0.5 MG PO TABS: 0.5000 mg | ORAL_TABLET | Freq: Three times a day (TID) | ORAL | 0 refills | Status: AC | PRN

## 2020-06-03 MED ORDER — ONDANSETRON HCL 8 MG PO TABS: 8.0000 mg | ORAL_TABLET | Freq: Three times a day (TID) | ORAL | 0 refills | Status: AC | PRN

## 2020-06-03 NOTE — Progress Notes (Signed)
[facility] Consult Note:  History: Candace Griffin 06/03/2020  Referring provider: Richarda Osmond, DO  Reason for consult/chief complaint: Gastroesophageal Reflux, nausea and vomiting, Weight Loss (22 lbs in 7 months), and Abdominal Pain (right periumbilical pain)   Subjective  HPI: Candace Griffin was originally referred to [place] by family practice clinic in August for abdominal pain, nausea, vomiting and weight loss that were thought perhaps due to refractory GERD.  She has a history of renal transplant at [facility] about 2 years ago and is on chronic post transplant immunosuppressive therapy.  She has chronic anemia that has been attributed to menorrhagia.  She was in the emergency department on September 22 for nausea vomiting abdominal pain and then was admitted for worsening symptoms last week and just discharged 2 days ago.  She was profoundly anemic and a CT of the abdomen found diffuse adenopathy, liver metastasis and a mass on her native left kidney.  Biopsy of a left inguinal lymph node shows metastatic carcinoma with staining suggesting urologic or gynecologic primary.  A pelvic ultrasound did not show a gynecologic primary lesion.  She had suspected thrush treated with 2 weeks of fluconazole, and was given a new patient appointment with oncology 3 days from now. ___________________  Candace Griffin is generally feeling poorly.  She has generalized abdominal pain that is more toward the upper abdomen, nausea and has lost over 20 pounds in the last 6 months.  She has intermittent regurgitation and heartburn.  We had a long discussion about her hospital stay and cancer diagnosis.  She is aware of it, knows she has a visit with the oncologist in a few days, but had some confusion about why she needed a subsequent liver biopsy. She has not needed dialysis since her kidney transplant, has been urinating well and says it has been dark for least the last couple of weeks, and this has  been attributed to her elevated bilirubin. She her bowel habits have decreased along with her decreased oral intake.  She has been able to keep down solid food, had not yet eaten anything today because she was not feeling well and had to come to this visit.  She has been able to keep down plenty of liquids by mouth. Lastly, the odynophagia and thrush she was complaining of in the hospital has resolved on fluconazole.    (> 30 minutes late for today's visit) ROS:  Review of Systems  Constitutional: Positive for fatigue, fever and unexpected weight change. Negative for appetite change.  HENT: Negative for mouth sores and voice change.   Eyes: Negative for pain and redness.  Respiratory: Negative for cough and shortness of breath.   Cardiovascular: Negative for chest pain and palpitations.  Genitourinary: Negative for dysuria and hematuria.  Musculoskeletal: Negative for arthralgias and myalgias.  Skin: Negative for pallor and rash.  Neurological: Negative for weakness and headaches.  Hematological: Positive for adenopathy.     Past Medical History: Past Medical History:  Diagnosis Date  . Chronic glomerulonephritis   . Dialysis patient (Candace Griffin)   . End stage renal disease ([place])   . GERD (gastroesophageal reflux disease)   . History of renal transplant 27-Oct-2011  . Hypertension      Past Surgical History: Past Surgical History:  Procedure Laterality Date  . COLPOSCOPY  2016  . diaylsis shunt Right    arm  . KIDNEY TRANSPLANT  2011/10/27   deceased donor kidney     Family History: Family History  Problem Relation  Age of Onset  . Diabetes Father   . Diabetes Paternal Aunt   . Diabetes Paternal Grandmother   . Alcohol abuse Paternal Grandmother   . Alcohol abuse Paternal Uncle   . Cancer Maternal Grandmother        breast  . Breast cancer Maternal Grandmother   . Alcohol abuse Paternal Grandfather     Social History: Social History   Socioeconomic History  . Marital  status: Single    Spouse name: Not on file  . Number of children: 3  . Years of education: 73  . Highest education level: Not on file  Occupational History    Comment: NA  Tobacco Use  . Smoking status: Never Smoker  . Smokeless tobacco: Never Used  Substance and Sexual Activity  . Alcohol use: No  . Drug use: No  . Sexual activity: Not Currently    Birth control/protection: None  Other Topics Concern  . Not on file  Social History Narrative   Lives at home with children   Caffeine - 10/27/16 "addicted to [employer], drank all day long", currently Mtn Dew 12 oz/week   Social Determinants of Health   Financial Resource Strain:   . Difficulty of Paying Living Expenses: Not on file  Food Insecurity:   . Worried About Charity fundraiser in the Last Year: Not on file  . Ran Out of Food in the Last Year: Not on file  Transportation Needs:   . Lack of Transportation (Medical): Not on file  . Lack of Transportation (Non-Medical): Not on file  Physical Activity:   . Days of Exercise per Week: Not on file  . Minutes of Exercise per Session: Not on file  Stress:   . Feeling of Stress : Not on file  Social Connections:   . Frequency of Communication with Friends and Family: Not on file  . Frequency of Social Gatherings with Friends and Family: Not on file  . Attends Religious Services: Not on file  . Active Member of Clubs or Organizations: Not on file  . Attends Archivist Meetings: Not on file  . Marital Status: Not on file    Allergies: Allergies  Allergen Reactions  . Lisinopril Cough  . Other Rash    Causes welts also  . Tape Rash and Other (See Comments)    Causes welts also    Outpatient Meds: Current Outpatient Medications  Medication Sig Dispense Refill  . cholecalciferol (VITAMIN D3) 25 MCG (1000 UNIT) tablet Take 1,000 Units by mouth daily.    . fluconazole (DIFLUCAN) 100 MG tablet Take 1 tablet (100 mg total) by mouth daily. 1 tablet 0  . LORazepam  (ATIVAN) 0.5 MG tablet Take 1 tablet (0.5 mg total) by mouth every 8 (eight) hours as needed for anxiety. 15 tablet 0  . mycophenolate (CELLCEPT) 250 MG capsule Take 500 mg by mouth 2 (two) times daily.     Marland Kitchen omeprazole (PRILOSEC) 20 MG capsule Take 20 mg by mouth 2 (two) times daily as needed (for heart burn).     . ondansetron (ZOFRAN-ODT) 8 MG disintegrating tablet Take 1 tablet (8 mg total) by mouth every 8 (eight) hours as needed for nausea or vomiting. 20 tablet 0  . oxyCODONE (OXY IR/ROXICODONE) 5 MG immediate release tablet Take 1 tablet (5 mg total) by mouth every 6 (six) hours as needed for up to 4 days for moderate pain or severe pain. 15 tablet 0  . pravastatin (PRAVACHOL) 20 MG  tablet Take 20 mg by mouth at bedtime.     . predniSONE (DELTASONE) 5 MG tablet Take 5 mg by mouth daily with breakfast.     . senna (SENOKOT) 8.6 MG TABS tablet Take 1 tablet (8.6 mg total) by mouth daily as needed for moderate constipation. 90 tablet 0  . Tacrolimus ER (ENVARSUS XR) 1 MG TB24 Take 4 mg by mouth daily.      No current facility-administered medications for this visit.      ___________________________________________________________________ Objective   Exam:  BP 110/74 (BP Location: Left Arm, Patient Position: Sitting, Cuff Size: Normal)   Pulse (!) 120   Ht _0  (1.702 m)   Wt 149 lb 12.8 oz (67.9 kg)   LMP 05/05/2020   BMI 23.46 kg/m    General: Chronically ill-appearing woman in a wheelchair.  She did not feel well enough to get on the exam table.  Eyes: sclera mildly icteric, no redness  ENT: oral mucosa moist without lesions, no cervical or supraclavicular lymphadenopathy  CV: RRR without murmur, S1/S2, no JVD, no peripheral edema  Resp: clear to auscultation bilaterally, normal RR and effort noted  GI: soft, generalized, more so upper tenderness, with active bowel sounds.  Difficult to determine if hepatomegaly and wheelchair seated position but most tenderness is in  that area  Skin; warm and dry, no rash or jaundice noted  Neuro: awake, alert and oriented x 3. Normal gross motor function and fluent speech  Labs:  CBC Latest Ref Rng & Units 06/01/2020 05/31/2020 05/30/2020  WBC 4.0 - 10.5 K/uL 4.8 5.9 5.7  Hemoglobin 12.0 - 15.0 g/dL 8.8(L) 9.2(L) 9.1(L)  Hematocrit 36 - 46 % 29.3(L) 30.2(L) 29.6(L)  Platelets 150 - 400 K/uL 236 251 225   CMP Latest Ref Rng & Units 06/01/2020 05/31/2020 05/30/2020  Glucose 70 - 99 mg/dL 95 103(H) 123(H)  BUN 6 - 20 mg/dL _1 Creatinine 0.44 - 1.00 mg/dL 1.09(H) 1.04(H) 0.99  Sodium 135 - 145 mmol/L 137 137 138  Potassium 3.5 - 5.1 mmol/L 3.9 3.8 4.1  Chloride 98 - 111 mmol/L 106 107 110  CO2 22 - 32 mmol/L 20(L) 19(L) 21(L)  Calcium 8.9 - 10.3 mg/dL 8.7(L) 8.8(L) 8.6(L)  Total Protein 6.5 - 8.1 g/dL 5.3(L) 5.4(L) 5.5(L)  Total Bilirubin 0.3 - 1.2 mg/dL 2.3(H) 2.1(H) 2.0(H)  Alkaline Phos 38 - 126 U/L 287(H) 298(H) 304(H)  AST 15 - 41 U/L 80(H) 109(H) 144(H)  ALT 0 - 44 U/L 69(H) 85(H) 96(H)   Hemoglobin was 4 on admission October 5th  Radiologic Studies:  CLINICAL DATA:  Abdominal distension.  Adenopathy.   EXAM: CT ABDOMEN AND PELVIS WITH CONTRAST   TECHNIQUE: Multidetector CT imaging of the abdomen and pelvis was performed using the standard protocol following bolus administration of intravenous contrast.   CONTRAST:  20m OMNIPAQUE IOHEXOL 350 MG/ML SOLN   COMPARISON:  Noncontrast abdominal CT 05/16/2020   FINDINGS: Lower chest:  No contributory findings.   Hepatobiliary: Multiple ill-defined masses within the liver. One of the largest is in the subcapsular inferior right lobe on 5:33 at 2 cm.No evidence of biliary obstruction or stone.   Pancreas: Unremarkable.   Spleen: Unremarkable.   Adrenals/Urinary Tract: Negative adrenals. Native renal atrophy. Solid masslike appearance at the left renal hilum which measures 2.2 cm. There is a transplant kidney in the left lower quadrant  with scarring involving the left-sided pole. There is pelviectasis without overt hydronephrosis. Failed transplant in the right lower  quadrant. Unremarkable bladder.   Stomach/Bowel: No obstruction. No inflammatory bowel wall thickening.   Vascular/Lymphatic: Bulky retroperitoneal lymphadenopathy. The nodes show areas of low-density and calcification, which would be heterogeneous for untreated lymphoma. For measurement purposes a node ventral to the aorta and cava on 5:44 measures 42 x 26 mm. There is also bilateral inguinal lymphadenopathy including a left inguinal node measuring 16 mm long axis. Adenopathy continues to the retrocural station. Atherosclerotic calcification, premature for age.   Reproductive:Intramural fibroids measuring up to 19 mm anteriorly.   Other: No ascites or pneumoperitoneum.   Musculoskeletal: No acute abnormalities.   IMPRESSION: 1. Redemonstrated inguinal and intra-abdominal lymphadenopathy. With the benefit of contrast, multiple liver lesions are also seen. Findings could reflect metastatic disease, lymphoma, or posttransplant lymphoproliferative disease. Left inguinal nodes are the most amenable to excisional biopsy. 2. 2.2 cm native left lower pole renal mass which may be related to#1. 3. Left lower quadrant transplant kidney with areas of cortical scarring. No hydronephrosis or perinephric collection.     Electronically Signed   By: Monte Fantasia M.D.   On: 05/27/2020 06:16 _________________________________________________  CLINICAL DATA:  Metastatic cancer unknown primary   EXAM: TRANSABDOMINAL AND TRANSVAGINAL ULTRASOUND OF PELVIS   TECHNIQUE: Both transabdominal and transvaginal ultrasound examinations of the pelvis were performed. Transabdominal technique was performed for global imaging of the pelvis including uterus, ovaries, adnexal regions, and pelvic cul-de-sac. It was necessary to proceed with endovaginal exam following  the transabdominal exam to visualize the uterus endometrium ovaries.   COMPARISON:  CT 05/27/2020   FINDINGS: Uterus   Measurements: 10.1 x 6.1 x 5.4 cm = volume: 172.5 mL. Nabothian cysts in the cervix. Left posterior myometrial mass measuring 1.4 x 1.8 x 1.1 cm. Right anterior intramural fundal mass measuring 1.6 x 2.1 x 1.6 cm.   Endometrium   Thickness: 6 mm.  No focal abnormality visualized.   Right ovary   Measurements: 1.5 x 1.8 x 2.1 cm = volume: 3.1 mL. Normal appearance/no adnexal mass.   Left ovary   Measurements: 1.4 x 1.8 x 1.8 cm = volume: 2.4 mL. Normal appearance/no adnexal mass.   Other findings   Moderate free fluid in the pelvis.   IMPRESSION: 1. Negative for ovarian mass lesion. 2. Uterine fibroids 3. Moderate free fluid in the pelvis     Electronically Signed   By: Donavan Foil M.D.   On: 05/30/2020 21:51  ___________________________  Bx result   FINAL MICROSCOPIC DIAGNOSIS:   A. LYMPH NODE, LEFT INGUINAL, NEEDLE CORE BIOPSY:  - Metastatic carcinoma.   COMMENT:   Immunohistochemistry for PAX 8 is positive.  P53 demonstrates  nonspecific staining.  TTF-1, p40, CDX-2, GATA-3, ER, WT-1 and CA-IX are  negative.  The morphology and immunophenotype are nonspecific.  The  differential diagnosis includes gynecologic and renal tubular origin,  among others.  Clinical and radiologic correlation are recommended.  Preliminary results reported to The Office of Dr. Irene Limbo on 05/29/2020.  Dr. Tresa Moore concurs with the diagnosis of malignancy   Assessment: Encounter Diagnoses  Name Primary?  . Nausea in adult Yes  . Weight loss   . Generalized abdominal pain   . Thrush     All symptoms related to patient's newly diagnosed metastatic malignancy.  I summarized scan biopsy and lab findings for her.  There was some uncertainty about either urologic or gynecologic source of malignancy based on the biopsies, which seems to have been prompted the biopsy  of a liver metastasis.  Those results  are still pending.  She appears euvolemic, and I think her tachycardia is from pain and physiologic stress of this metastatic malignancy. Her thrush and odynophagia are resolved and she should complete the fluconazole course. Candace Griffin has pain medicine and antiemetics which she has been taking regularly, and has an initial oncology evaluation in a few days.  Plan:  She does not need any additional GI directed tests at this point or new prescriptions for me.  I will remain part of her treatment team if I am needed and be sure to send my note to both primary care and her new oncologist.  Thank you for the courtesy of this consult.  Please call me with any questions or concerns.  Nelida Meuse III  CC: Referring provider noted above

## 2020-06-03 NOTE — Patient Instructions (Addendum)
It was good to see you today.  Thank you for coming in.  Make sure to follow-up with your oncologist in the next week  I am prescribing you 1 month supply of Ativan 0.5mg  to take for anxiety as needed.  I am increasing your Oxycodone to 10 mg every 8 hours as needed and giving you a 1 month supply as well.  Start taking Senna daily for constipation as needed if develop constipation on this dose.  I am also re-prescribing your   Please see Dr Ouida Sills in 4 weeks after seeing Oncology for follow-up with pain control and management.      Be Well, Dr. Delora Fuel

## 2020-06-04 ENCOUNTER — Inpatient Hospital Stay: Payer: Medicare Other

## 2020-06-04 LAB — SURGICAL PATHOLOGY

## 2020-06-05 NOTE — Assessment & Plan Note (Addendum)
Patient has upcoming visit scheduled with Oncology.  Was given 3 day supply o Ativan 0.5 mg and Oxycocodone 5 mg upon leaving hospital.  Pain is only partially controlled on this dose per patient.  Patient also having nausea and has been unable to get Zofran 8 mg ODT. -f/u with Oncology for disease management -Will increase Oxycodone to 10 mg q4hr and prescribe 1 months supply -Will continue Ativan 0.5 mg and give 1 months supply -Will switch to Ondansetron 8 mg PO tablet -f/u with PCP in 1 month after seeing Oncology

## 2020-06-05 NOTE — Progress Notes (Signed)
    SUBJECTIVE:   CHIEF COMPLAINT / HPI: Hospital f/u  Candace Griffin is a 44 yo female for hospital f/u for abdominal pain and fever.  CT obtained during hospital showed Metastatic Carcinoma to liver.  Patient currently having significant pain.  Was D/C from hospital with 3 day supply of Oxycodone 5mg  q6hr PRN and Ativan 0.5mg  q8hr PRN for anxiety.  Indicates this 5 mg only partially controlling pain.  Also endorses nausea and indicates she has been unable to get Zofran from Pharmacy.  Was seen by GI earlier today who do not plan on EGD or other intervention and has f/u scheduled with Oncology.  Has no toher comlaints at this time and expresses wanting something to control pain.  PERTINENT  PMH / PSH: Kidney transplant  OBJECTIVE:   BP 110/70   Pulse (!) 112   Wt 150 lb (68 kg)   LMP 05/06/2020   SpO2 100%   BMI 23.49 kg/m    Physical Exam Constitutional:      Appearance: Normal appearance.     Comments: Patient sitting in wheelchair, appears uncomfortable  HENT:     Mouth/Throat:     Mouth: Mucous membranes are moist.     Pharynx: Oropharynx is clear. No oropharyngeal exudate or posterior oropharyngeal erythema.     Comments: Minimal thrush on tongue Cardiovascular:     Rate and Rhythm: Normal rate and regular rhythm.  Pulmonary:     Effort: Pulmonary effort is normal.     Breath sounds: Normal breath sounds.  Neurological:     Mental Status: She is alert.  Psychiatric:        Mood and Affect: Mood normal.        Behavior: Behavior normal.     ASSESSMENT/PLAN:   Metastatic carcinoma to liver [facility]) Patient has upcoming visit scheduled with Oncology.  Was given 3 day supply o Ativan 0.5 mg and Oxycocodone 5 mg upon leaving hospital.  Pain is only partially controlled on this dose per patient.  Patient also having nausea and has been unable to get Zofran 8 mg ODT. -f/u with Oncology for disease management -Will increase Oxycodone to 10 mg q4hr and prescribe 1 months  supply -Will continue Ativan 0.5 mg and give 1 months supply -Will switch to Ondansetron 8 mg PO tablet -f/u with PCP in 1 month after seeing Oncology  Constipation Patient denies currently, but concern for with increased Oxycodone dosage.  Has supply of Senna to take as needed. - Recommend starting Senna daily if constipation from Oxycodone usage    Delora Fuel, MD [place]

## 2020-06-05 NOTE — Assessment & Plan Note (Signed)
Patient denies currently, but concern for with increased Oxycodone dosage.  Has supply of Senna to take as needed. - Recommend starting Senna daily if constipation from Oxycodone usage

## 2020-06-06 ENCOUNTER — Other Ambulatory Visit: Payer: Self-pay

## 2020-06-06 ENCOUNTER — Inpatient Hospital Stay: Payer: Medicare Other

## 2020-06-06 ENCOUNTER — Inpatient Hospital Stay: Payer: Medicare Other | Attending: Hematology and Oncology | Admitting: Hematology

## 2020-06-06 VITALS — BP 109/80 | HR 123 | Temp 95.7°F | Resp 18 | Ht 67.0 in | Wt 141.1 lb

## 2020-06-06 DIAGNOSIS — I129 Hypertensive chronic kidney disease with stage 1 through stage 4 chronic kidney disease, or unspecified chronic kidney disease: Secondary | ICD-10-CM | POA: Diagnosis not present

## 2020-06-06 DIAGNOSIS — E43 Unspecified severe protein-calorie malnutrition: Secondary | ICD-10-CM | POA: Diagnosis not present

## 2020-06-06 DIAGNOSIS — N186 End stage renal disease: Secondary | ICD-10-CM | POA: Diagnosis not present

## 2020-06-06 DIAGNOSIS — C787 Secondary malignant neoplasm of liver and intrahepatic bile duct: Secondary | ICD-10-CM | POA: Diagnosis not present

## 2020-06-06 DIAGNOSIS — Z940 Kidney transplant status: Secondary | ICD-10-CM | POA: Insufficient documentation

## 2020-06-06 DIAGNOSIS — D649 Anemia, unspecified: Secondary | ICD-10-CM

## 2020-06-06 DIAGNOSIS — C778 Secondary and unspecified malignant neoplasm of lymph nodes of multiple regions: Secondary | ICD-10-CM | POA: Insufficient documentation

## 2020-06-06 DIAGNOSIS — Z5111 Encounter for antineoplastic chemotherapy: Secondary | ICD-10-CM | POA: Insufficient documentation

## 2020-06-06 DIAGNOSIS — N2889 Other specified disorders of kidney and ureter: Secondary | ICD-10-CM | POA: Diagnosis not present

## 2020-06-06 DIAGNOSIS — Z7189 Other specified counseling: Secondary | ICD-10-CM

## 2020-06-06 DIAGNOSIS — C801 Malignant (primary) neoplasm, unspecified: Secondary | ICD-10-CM | POA: Diagnosis not present

## 2020-06-06 DIAGNOSIS — K219 Gastro-esophageal reflux disease without esophagitis: Secondary | ICD-10-CM | POA: Insufficient documentation

## 2020-06-06 DIAGNOSIS — Z79899 Other long term (current) drug therapy: Secondary | ICD-10-CM | POA: Insufficient documentation

## 2020-06-06 DIAGNOSIS — Z5189 Encounter for other specified aftercare: Secondary | ICD-10-CM | POA: Diagnosis not present

## 2020-06-06 DIAGNOSIS — N039 Chronic nephritic syndrome with unspecified morphologic changes: Secondary | ICD-10-CM | POA: Diagnosis not present

## 2020-06-06 LAB — CBC WITH DIFFERENTIAL/PLATELET
Abs Immature Granulocytes: 0.03 10*3/uL (ref 0.00–0.07)
Basophils Absolute: 0 10*3/uL (ref 0.0–0.1)
Basophils Relative: 0 %
Eosinophils Absolute: 0 10*3/uL (ref 0.0–0.5)
Eosinophils Relative: 0 %
HCT: 34 % — ABNORMAL LOW (ref 36.0–46.0)
Hemoglobin: 10.7 g/dL — ABNORMAL LOW (ref 12.0–15.0)
Immature Granulocytes: 0 %
Lymphocytes Relative: 15 %
Lymphs Abs: 1.1 10*3/uL (ref 0.7–4.0)
MCH: 25.1 pg — ABNORMAL LOW (ref 26.0–34.0)
MCHC: 31.5 g/dL (ref 30.0–36.0)
MCV: 79.8 fL — ABNORMAL LOW (ref 80.0–100.0)
Monocytes Absolute: 1.2 10*3/uL — ABNORMAL HIGH (ref 0.1–1.0)
Monocytes Relative: 17 %
Neutro Abs: 4.6 10*3/uL (ref 1.7–7.7)
Neutrophils Relative %: 68 %
Platelets: 487 10*3/uL — ABNORMAL HIGH (ref 150–400)
RBC: 4.26 MIL/uL (ref 3.87–5.11)
RDW: 16.8 % — ABNORMAL HIGH (ref 11.5–15.5)
WBC: 6.9 10*3/uL (ref 4.0–10.5)
nRBC: 0 % (ref 0.0–0.2)

## 2020-06-06 LAB — CMP (CANCER CENTER ONLY)
ALT: 41 U/L (ref 0–44)
AST: 68 U/L — ABNORMAL HIGH (ref 15–41)
Albumin: 2.5 g/dL — ABNORMAL LOW (ref 3.5–5.0)
Alkaline Phosphatase: 403 U/L — ABNORMAL HIGH (ref 38–126)
Anion gap: 15 (ref 5–15)
BUN: 13 mg/dL (ref 6–20)
CO2: 25 mmol/L (ref 22–32)
Calcium: 10.1 mg/dL (ref 8.9–10.3)
Chloride: 95 mmol/L — ABNORMAL LOW (ref 98–111)
Creatinine: 1 mg/dL (ref 0.44–1.00)
GFR, Estimated: >60 mL/min (ref >60)
Glucose, Bld: 93 mg/dL (ref 70–99)
Potassium: 5.3 mmol/L — ABNORMAL HIGH (ref 3.5–5.1)
Sodium: 135 mmol/L (ref 135–145)
Total Bilirubin: 2.8 mg/dL — ABNORMAL HIGH (ref 0.3–1.2)
Total Protein: 7.8 g/dL (ref 6.5–8.1)

## 2020-06-06 LAB — SAMPLE TO BLOOD BANK

## 2020-06-06 NOTE — Progress Notes (Signed)
HEMATOLOGY/ONCOLOGY CONSULTATION NOTE  Date of Service: 06/06/2020  Patient Care Team: Richarda Osmond, DO as PCP - General  CHIEF COMPLAINTS/PURPOSE OF CONSULTATION:  Newly diagnosed metastatic carcinoma of unknown primary  HISTORY OF PRESENTING ILLNESS:  Candace Griffin is a wonderful 44 y.o. female with a past medical history significant for chronic glomerulonephritis status post renal transplant in July 2019 and currently taking mycophenolate, tacrolimus, and prednisone, anemia, GERD, hypertension, menorrhagia.  The patient presented to the emergency room with chest pain or shortness of breath for several days.  In the emergency room, her hemoglobin was 4.0 (previously 8.6 on 05/15/2020) and chemistry showed a creatinine of 1.13, calcium 8.0, albumin 2.0, AST 336, ALT 105, alkaline phosphatase 242, and T bili 1.5.  2 units of PRBCs have been ordered.  She had a CTA of the chest which was negative for PE or any other acute finding.  CT of the abdomen and pelvis showed redemonstrated inguinal and intra-abdominal lymphadenopathy, multiple liver lesions-findings concerning for metastatic disease, lymphoma, or post transplant lymphoproliferative disease, 2.2 cm native left lower pole renal mass, left lower quadrant transplant kidney with areas of cortical scarring.  Of note, the retroperitoneal, pelvic, and inguinal lymphadenopathy was present on a CT scan performed on 05/16/2020.  She was referred back to her primary care provider for further work-up of the lymphadenopathy.  She states that she was scheduled to see her primary care provider today to follow-up on these results but ended up coming to the emergency room instead.  She has been seen by interventional radiology for for consideration of CT-guided biopsy of one of her inguinal lymph nodes and underwent CT-guided biopsy of the left inguinal lymph node just prior to my visit.  The patient is somewhat sleepy at the time of visit since  she just returned from IR.  She is able to awaken and answer questions.  She states that she has had fatigue and generalized weakness as well as a weight loss of about 20 pounds since the end of August 2021.  She has not been able to eat or drink much of anything secondary to nausea and vomiting.  She reports fevers, but no chills or night sweats.  She is able to palpate the lymph nodes in her inguinal area.  She denies headaches and dizziness.  She has been experiencing chest pain or shortness of breath.  Denies abdominal pain.  She reports significant constipation over the past month.  Denies epistaxis, hemoptysis, hematemesis, hematuria, melena, hematochezia.  Denies lower extremity edema.  Medical oncology was asked to see the patient for recommendations regarding her abnormal CT scan findings and anemia.  INTERVAL HISTORY:  Candace Griffin is a wonderful 44 y.o. female who is here for the evaluation and management of extensive lymphadenopathy, liver lesions, and renal mass. The patient's last visit with [place] was on 05/31/2020. The pt reports that she is doing well overall.  The pt reports that she feels dehyrated and weak. She continues to vomit, which makes her not want to eat. She is experiencing persistent nausea that is mildly improved with Zofran. Her abdominal pain is relatively well controlled as she is taking 10 mg Oxycodone 1-2 times per day. Pt was given Senna w/stool softener by her PCP.  Of note since the patient's last visit, pt has had Liver Surgical Pathology Report [phone]) completed on 05/29/2020 with results revealing "Metastatic carcinoma".  Lab results (06/01/20) of CBC and CMP is as follows: all values are WNL  except for RBCat3.58, Hgb at 8.8, HCT at 29.3, MCH at 24.6, RDW at 16.1, CO2 at 20, Creatinine at 1.09, Calcium at 8.7, Total Protein at 5.3, Albumin at 1.9, AST at 80, ALT at 69, ALP at 287, Total Bilirubin at 2.3.  On review of systems, pt reports abdominal  pain, emesis, nausea, constipation and denies bloody/black stools, cough, SOB and any other symptoms.    MEDICAL HISTORY:  Past Medical History:  Diagnosis Date  . Chronic glomerulonephritis   . Dialysis patient ([place])   . End stage renal disease ([place])   . GERD (gastroesophageal reflux disease)   . History of renal transplant 10/17/2011  . Hypertension     SURGICAL HISTORY: Past Surgical History:  Procedure Laterality Date  . COLPOSCOPY  2016  . diaylsis shunt Right    arm  . KIDNEY TRANSPLANT  10-17-2011   deceased donor kidney    SOCIAL HISTORY: Social History   Socioeconomic History  . Marital status: Single    Spouse name: Not on file  . Number of children: 3  . Years of education: 42  . Highest education level: Not on file  Occupational History    Comment: NA  Tobacco Use  . Smoking status: Never Smoker  . Smokeless tobacco: Never Used  Substance and Sexual Activity  . Alcohol use: No  . Drug use: No  . Sexual activity: Not Currently    Birth control/protection: None  Other Topics Concern  . Not on file  Social History Narrative   Lives at home with children   Caffeine - 10/27/16 "addicted to [employer], drank all day long", currently Mtn Dew 12 oz/week   Social Determinants of Health   Financial Resource Strain:   . Difficulty of Paying Living Expenses: Not on file  Food Insecurity:   . Worried About Charity fundraiser in the Last Year: Not on file  . Ran Out of Food in the Last Year: Not on file  Transportation Needs:   . Lack of Transportation (Medical): Not on file  . Lack of Transportation (Non-Medical): Not on file  Physical Activity:   . Days of Exercise per Week: Not on file  . Minutes of Exercise per Session: Not on file  Stress:   . Feeling of Stress : Not on file  Social Connections:   . Frequency of Communication with Friends and Family: Not on file  . Frequency of Social Gatherings with Friends and Family: Not on file  . Attends Religious  Services: Not on file  . Active Member of Clubs or Organizations: Not on file  . Attends Archivist Meetings: Not on file  . Marital Status: Not on file  Intimate Partner Violence:   . Fear of Current or Ex-Partner: Not on file  . Emotionally Abused: Not on file  . Physically Abused: Not on file  . Sexually Abused: Not on file    FAMILY HISTORY: Family History  Problem Relation Age of Onset  . Diabetes Father   . Diabetes Paternal Aunt   . Diabetes Paternal Grandmother   . Alcohol abuse Paternal Grandmother   . Alcohol abuse Paternal Uncle   . Cancer Maternal Grandmother        breast  . Breast cancer Maternal Grandmother   . Alcohol abuse Paternal Grandfather     ALLERGIES:  is allergic to lisinopril, other, and tape.  MEDICATIONS:  Current Outpatient Medications  Medication Sig Dispense Refill  . cholecalciferol (VITAMIN D3) 25 MCG (  1000 UNIT) tablet Take 1,000 Units by mouth daily.    . fluconazole (DIFLUCAN) 100 MG tablet Take 1 tablet (100 mg total) by mouth daily. 1 tablet 0  . LORazepam (ATIVAN) 0.5 MG tablet Take 1 tablet (0.5 mg total) by mouth every 8 (eight) hours as needed for anxiety. 45 tablet 0  . mycophenolate (CELLCEPT) 250 MG capsule Take 500 mg by mouth 2 (two) times daily.     Marland Kitchen omeprazole (PRILOSEC) 20 MG capsule Take 20 mg by mouth 2 (two) times daily as needed (for heart burn).     . ondansetron (ZOFRAN) 8 MG tablet Take 1 tablet (8 mg total) by mouth every 8 (eight) hours as needed for nausea or vomiting. 30 tablet 0  . oxyCODONE (ROXICODONE) 5 MG immediate release tablet Take 2 tablets (10 mg total) by mouth every 4 (four) hours as needed for moderate pain or severe pain. 60 tablet 0  . pravastatin (PRAVACHOL) 20 MG tablet Take 20 mg by mouth at bedtime.     . predniSONE (DELTASONE) 5 MG tablet Take 5 mg by mouth daily with breakfast.     . senna (SENOKOT) 8.6 MG TABS tablet Take 1 tablet (8.6 mg total) by mouth daily as needed for moderate  constipation. 90 tablet 0  . Tacrolimus ER (ENVARSUS XR) 1 MG TB24 Take 4 mg by mouth daily.      No current facility-administered medications for this visit.    REVIEW OF SYSTEMS:    10 Point review of Systems was done is negative except as noted above.  PHYSICAL EXAMINATION: ECOG PERFORMANCE STATUS: 2 - Symptomatic, <50% confined to bed  . Vitals:   06/06/20 1507  BP: 109/80  Pulse: (!) 123  Resp: 18  Temp: (!) 95.7 F (35.4 C)  SpO2: 99%   Filed Weights   06/06/20 1507  Weight: 141 lb 1.6 oz (64 kg)   .Body mass index is 22.1 kg/m.  GENERAL:alert, in no acute distress and comfortable SKIN: no acute rashes, no significant lesions EYES: conjunctiva are pink and non-injected, sclera anicteric OROPHARYNX: MMM, no exudates, no oropharyngeal erythema or ulceration NECK: supple, no JVD LYMPH:  no palpable lymphadenopathy in the cervical, axillary or inguinal regions LUNGS: clear to auscultation b/l with normal respiratory effort HEART: regular rate & rhythm ABDOMEN:  normoactive bowel sounds , non tender, not distended. Extremity: no pedal edema PSYCH: alert & oriented x 3 with fluent speech NEURO: no focal motor/sensory deficits  LABORATORY DATA:  I have reviewed the data as listed  . CBC Latest Ref Rng & Units 06/06/2020 06/01/2020 05/31/2020  WBC 4.0 - 10.5 K/uL 6.9 4.8 5.9  Hemoglobin 12.0 - 15.0 g/dL 10.7(L) 8.8(L) 9.2(L)  Hematocrit 36 - 46 % 34.0(L) 29.3(L) 30.2(L)  Platelets 150 - 400 K/uL 487(H) 236 251    . CMP Latest Ref Rng & Units 06/06/2020 06/01/2020 05/31/2020  Glucose 70 - 99 mg/dL 93 95 103(H)  BUN 6 - 20 mg/dL 13 10 9   Creatinine 0.44 - 1.00 mg/dL 1.00 1.09(H) 1.04(H)  Sodium 135 - 145 mmol/L 135 137 137  Potassium 3.5 - 5.1 mmol/L 5.3(H) 3.9 3.8  Chloride 98 - 111 mmol/L 95(L) 106 107  CO2 22 - 32 mmol/L 25 20(L) 19(L)  Calcium 8.9 - 10.3 mg/dL 10.1 8.7(L) 8.8(L)  Total Protein 6.5 - 8.1 g/dL 7.8 5.3(L) 5.4(L)  Total Bilirubin 0.3 - 1.2  mg/dL 2.8(H) 2.3(H) 2.1(H)  Alkaline Phos 38 - 126 U/L 403(H) 287(H) 298(H)  AST 15 -  41 U/L 68(H) 80(H) 109(H)  ALT 0 - 44 U/L 41 69(H) 85(H)       RADIOGRAPHIC STUDIES: I have personally reviewed the radiological images as listed and agreed with the findings in the report. CT ABDOMEN PELVIS WO CONTRAST  Result Date: 05/16/2020 CLINICAL DATA:  Bilious emesis, renal transplant EXAM: CT ABDOMEN AND PELVIS WITHOUT CONTRAST TECHNIQUE: Multidetector CT imaging of the abdomen and pelvis was performed following the standard protocol without IV contrast. COMPARISON:  None. FINDINGS: Lower chest: No acute abnormality. Hepatobiliary: No focal liver abnormality is seen. No gallstones, gallbladder wall thickening, or biliary dilatation. Pancreas: Unremarkable Spleen: Unremarkable Adrenals/Urinary Tract: The adrenal glands are unremarkable. The native kidneys are markedly atrophic in keeping with changes of chronic renal failure. Previously noted right transplant kidney has been surgically removed and a residual partially calcified complex collection is seen within the resection bed likely representing a postoperative complex seroma or chronic hematoma. A new transplant kidney is seen within the left iliac fossa and is normal in size and demonstrates normal cortical thickness. There is no hydronephrosis. The bladder is unremarkable. Stomach/Bowel: Stomach, small bowel, and large bowel are unremarkable. Appendix normal. No free intraperitoneal gas or fluid. Vascular/Lymphatic: Since the prior examination, there has developed extensive pathologic adenopathy within the left periaortic, aortocaval, and retrocaval lymph node groups, as well as the bilateral common iliac, left external and bilateral inguinal lymph node groups. Bulky adenopathy is compatible with lymphoma. Index lymph node anterior to the abdominal aorta at axial image # 37/3 measures 2.4 x 4.2 cm in greatest dimension. Moderate aortoiliac atherosclerotic  calcification is present without evidence of aneurysm. Reproductive: Uterus and bilateral adnexa are unremarkable. Other: Rectum unremarkable. Musculoskeletal: Bone island within the right ischium. No lytic or blastic bone lesions are seen. IMPRESSION: Interval development of extensive retroperitoneal, pelvic, and inguinal lymphadenopathy most in keeping with lymphoma in this immunocompromised patient. Left inguinal and external iliac lymphadenopathy should be easily amenable to ultrasound-guided biopsy for further evaluation. Interval explantation of right renal transplant and implantation of new transplant kidney within the left iliac fossa. Aortic Atherosclerosis (ICD10-I70.0). Electronically Signed   By: Fidela Salisbury MD   On: 05/16/2020 05:49   CT Angio Chest PE W and/or Wo Contrast  Result Date: 05/27/2020 CLINICAL DATA:  PE suspected, high probability EXAM: CT ANGIOGRAPHY CHEST WITH CONTRAST TECHNIQUE: Multidetector CT imaging of the chest was performed using the standard protocol during bolus administration of intravenous contrast. Multiplanar CT image reconstructions and MIPs were obtained to evaluate the vascular anatomy. CONTRAST:  97mL OMNIPAQUE IOHEXOL 350 MG/ML SOLN COMPARISON:  None. FINDINGS: Cardiovascular: Satisfactory opacification of the pulmonary arteries to the segmental level. No evidence of pulmonary embolism. Normal heart size. No pericardial effusion. Mediastinum/Nodes: Retrocrural lymphadenopathy, reference abdominal CT. Lungs/Pleura: 2 mm peripheral right upper lobe pulmonary nodule, attention on follow-up. There is no edema, consolidation, effusion, or pneumothorax. Upper Abdomen: Reported on dedicated study. Musculoskeletal: No acute or aggressive finding. Review of the MIP images confirms the above findings. IMPRESSION: Negative for pulmonary embolism or other acute finding. Electronically Signed   By: Monte Fantasia M.D.   On: 05/27/2020 06:02   CT ABDOMEN PELVIS W  CONTRAST  Result Date: 05/27/2020 CLINICAL DATA:  Abdominal distension.  Adenopathy. EXAM: CT ABDOMEN AND PELVIS WITH CONTRAST TECHNIQUE: Multidetector CT imaging of the abdomen and pelvis was performed using the standard protocol following bolus administration of intravenous contrast. CONTRAST:  71mL OMNIPAQUE IOHEXOL 350 MG/ML SOLN COMPARISON:  Noncontrast abdominal CT 05/16/2020 FINDINGS: Lower  chest:  No contributory findings. Hepatobiliary: Multiple ill-defined masses within the liver. One of the largest is in the subcapsular inferior right lobe on 5:33 at 2 cm.No evidence of biliary obstruction or stone. Pancreas: Unremarkable. Spleen: Unremarkable. Adrenals/Urinary Tract: Negative adrenals. Native renal atrophy. Solid masslike appearance at the left renal hilum which measures 2.2 cm. There is a transplant kidney in the left lower quadrant with scarring involving the left-sided pole. There is pelviectasis without overt hydronephrosis. Failed transplant in the right lower quadrant. Unremarkable bladder. Stomach/Bowel: No obstruction. No inflammatory bowel wall thickening. Vascular/Lymphatic: Bulky retroperitoneal lymphadenopathy. The nodes show areas of low-density and calcification, which would be heterogeneous for untreated lymphoma. For measurement purposes a node ventral to the aorta and cava on 5:44 measures 42 x 26 mm. There is also bilateral inguinal lymphadenopathy including a left inguinal node measuring 16 mm long axis. Adenopathy continues to the retrocural station. Atherosclerotic calcification, premature for age. Reproductive:Intramural fibroids measuring up to 19 mm anteriorly. Other: No ascites or pneumoperitoneum. Musculoskeletal: No acute abnormalities. IMPRESSION: 1. Redemonstrated inguinal and intra-abdominal lymphadenopathy. With the benefit of contrast, multiple liver lesions are also seen. Findings could reflect metastatic disease, lymphoma, or posttransplant lymphoproliferative disease.  Left inguinal nodes are the most amenable to excisional biopsy. 2. 2.2 cm native left lower pole renal mass which may be related to#1. 3. Left lower quadrant transplant kidney with areas of cortical scarring. No hydronephrosis or perinephric collection. Electronically Signed   By: Monte Fantasia M.D.   On: 05/27/2020 06:16   US BIOPSY (LIVER)  Result Date: 05/31/2020 INDICATION: 44 year old female with widespread nodal and hepatic metastatic disease of uncertain etiology. She does have a solid endophytic mass in the left renal hilum. Patient recently underwent ultrasound-guided core biopsy of a metastatic left inguinal lymph node. Biopsy specimens were diagnostic for carcinoma but nonspecific as to tissue source. Therefore, patient presents for repeat biopsy of 1 of the hepatic lesions. EXAM: ULTRASOUND BIOPSY CORE LIVER MEDICATIONS: None. ANESTHESIA/SEDATION: Moderate (conscious) sedation was employed during this procedure. A total of Versed 2 mg and Fentanyl 100 mcg was administered intravenously. Moderate Sedation Time: 9 minutes. The patient's level of consciousness and vital signs were monitored continuously by radiology nursing throughout the procedure under my direct supervision. FLUOROSCOPY TIME:  None COMPLICATIONS: None immediate. PROCEDURE: Informed written consent was obtained from the patient after a thorough discussion of the procedural risks, benefits and alternatives. All questions were addressed. Maximal Sterile Barrier Technique was utilized including caps, mask, sterile gowns, sterile gloves, sterile drape, hand hygiene and skin antiseptic. A timeout was performed prior to the initiation of the procedure. The liver was interrogated with ultrasound. A well-defined hepatic lesion was successfully identified. A suitable skin entry site was selected and marked. The overlying skin was sterilely prepped and draped in the standard fashion using chlorhexidine skin prep. Local anesthesia was attained  by infiltration with 1% lidocaine. A small dermatotomy was made. Under real-time ultrasound guidance, a 17 gauge introducer needle was advanced through the liver and positioned at the margin of the mass. Multiple 18 gauge core biopsies were then obtained coaxially using a bio Pince automated biopsy device. Biopsy specimens were placed in formalin and delivered to pathology for further analysis. As the introducer needle was removed, the biopsy tract was embolized with a Gel-Foam slurry. The patient tolerated the procedure well. IMPRESSION: Technically successful ultrasound-guided core biopsy of liver lesion. Signed, Criselda Peaches, MD, RPVI [facility] Electronically Signed   By:  Jacqulynn Cadet M.D.   On: 05/31/2020 16:48   DG Chest Port 1 View  Result Date: 05/26/2020 CLINICAL DATA:  Chest pain and vomiting EXAM: PORTABLE CHEST 1 VIEW COMPARISON:  Chest x-ray 02/03/2015. FINDINGS: The heart size and mediastinal contours are within normal limits. No focal consolidation. No pulmonary edema. No pleural effusion. No pneumothorax. No acute osseous abnormality. IMPRESSION: No active disease. Electronically Signed   By: Iven Finn M.D.   On: 05/26/2020 23:40   [place] CORE BIOPSY (LYMPH NODES)  Result Date: 05/27/2020 INDICATION: History of previous renal transplantation (x2), now with bulky retroperitoneal, pelvic and bilateral inguinal lymphadenopathy as well as indeterminate liver lesions. Please from ultrasound-guided left inguinal lymph node biopsy for tissue diagnostic purposes. EXAM: ULTRASOUND-GUIDED LEFT INGUINAL LYMPH NODE BIOPSY COMPARISON:  CT of the chest, abdomen and pelvis - 05/27/2020 MEDICATIONS: None ANESTHESIA/SEDATION: Moderate (conscious) sedation was employed during this procedure. A total of Versed 1 mg and Fentanyl 50 mcg was administered intravenously. Moderate Sedation Time: 10 minutes. The patient's level of consciousness  and vital signs were monitored continuously by radiology nursing throughout the procedure under my direct supervision. COMPLICATIONS: None immediate. TECHNIQUE: Informed written consent was obtained from the patient after a discussion of the risks, benefits and alternatives to treatment. Questions regarding the procedure were encouraged and answered. Initial ultrasound scanning demonstrated an approximately 1.7 x 1.7 x 1.6 cm hypoechoic lymph node within the left groin, correlating with dominant left inguinal lymph node seen on preceding abdominal CT image 85, series 5. an ultrasound image was saved for documentation purposes. The procedure was planned. A timeout was performed prior to the initiation of the procedure. The operative was prepped and draped in the usual sterile fashion, and a sterile drape was applied covering the operative field. A timeout was performed prior to the initiation of the procedure. Local anesthesia was provided with 1% lidocaine with epinephrine. Under direct ultrasound guidance, an 18 gauge core needle device was utilized to obtain to obtain 8 core needle biopsies of the dominant indeterminate left inguinal lymph node. The samples were placed in saline and submitted to pathology. The needle was removed and hemostasis was achieved with manual compression. Post procedure scan was negative for significant hematoma. A dressing was placed. The patient tolerated the procedure well without immediate postprocedural complication. IMPRESSION: Technically successful ultrasound guided biopsy of dominant indeterminate left inguinal lymph node. Electronically Signed   By: Sandi Mariscal M.D.   On: 05/27/2020 13:59   US PELVIC COMPLETE WITH TRANSVAGINAL  Result Date: 05/30/2020 CLINICAL DATA:  Metastatic cancer unknown primary EXAM: TRANSABDOMINAL AND TRANSVAGINAL ULTRASOUND OF PELVIS TECHNIQUE: Both transabdominal and transvaginal ultrasound examinations of the pelvis were performed. Transabdominal  technique was performed for global imaging of the pelvis including uterus, ovaries, adnexal regions, and pelvic cul-de-sac. It was necessary to proceed with endovaginal exam following the transabdominal exam to visualize the uterus endometrium ovaries. COMPARISON:  CT 05/27/2020 FINDINGS: Uterus Measurements: 10.1 x 6.1 x 5.4 cm = volume: 172.5 mL. Nabothian cysts in the cervix. Left posterior myometrial mass measuring 1.4 x 1.8 x 1.1 cm. Right anterior intramural fundal mass measuring 1.6 x 2.1 x 1.6 cm. Endometrium Thickness: 6 mm.  No focal abnormality visualized. Right ovary Measurements: 1.5 x 1.8 x 2.1 cm = volume: 3.1 mL. Normal appearance/no adnexal mass. Left ovary Measurements: 1.4 x 1.8 x 1.8 cm = volume: 2.4 mL. Normal appearance/no adnexal mass. Other findings Moderate free fluid in the pelvis. IMPRESSION: 1. Negative for ovarian mass lesion.  2. Uterine fibroids 3. Moderate free fluid in the pelvis Electronically Signed   By: Donavan Foil M.D.   On: 05/30/2020 21:51   US Abdomen Limited RUQ  Result Date: 06/01/2020 CLINICAL DATA:  Liver mass.  Recent biopsy.  Pain post biopsy EXAM: ULTRASOUND ABDOMEN LIMITED RIGHT UPPER QUADRANT COMPARISON:  05/31/2020 FINDINGS: Gallbladder: There is gallbladder sludge without sonographic evidence for acute cholecystitis. There is no gallbladder wall thickening. The sonographic Percell Miller sign is negative. Common bile duct: Diameter: 4 mm Liver: Multiple hepatic lesions are again noted phi, better appreciated on the patient's prior CT. Portal vein is patent on color Doppler imaging with normal direction of blood flow towards the liver. Other: There is a trace amount of perihepatic free fluid. IMPRESSION: Trace perihepatic free fluid. Otherwise, stable exam with multiple hepatic lesions again identified. Electronically Signed   By: Constance Holster M.D.   On: 06/01/2020 03:42    ASSESSMENT & PLAN:   44 yo with   1) Metastatic carcinoma of unknown  primary Patient with h/o renal transplantation on tacrolimus, prednisone and cellcept chronic immunosuppression with extensive metastatic disease with extensive abd/pelvic LNadenopathy and hepatic metastases.  IHC from LN and liver metastases -- non specific morphology and IC ? Gyn vs renal tubular origin. Tumor markers unrevealing Component     Latest Ref Rng & Units 05/29/2020 05/31/2020  CEA     0.0 - 4.7 ng/mL 0.9   AFP, Serum, Tumor Marker     0.0 - 8.3 ng/mL 1.7   CA 19-9     0 - 35 U/mL 24   Cancer Antigen (CA) 125     0.0 - 38.1 U/mL 20.1   hCG Quant     mIU/mL 3   LDH     98 - 192 U/L  178   PLAN: -Advised pt that she has a metastatic Carcinoma of unknown primarily - likely renal or gynecological. -Advised pt that Cancer-ID are currently in progress, which may tell us site of primary disease. -Advised pt that if we can locate the primary disease we can use targeted therapies which would give [place] more options.  -Advised pt that treatment for this disease is not curable, but is palliative in nature. Recommend pt get affairs in order. -Advised pt that her immunosuppressive mediations will increase her risk of infections and complicate treatment options. -Discussed beginning a generic chemotherapy treatment vs referal to [facility] for second option vs referal to palliative care - Pt is not interested in palliative care at this time. -Recommend that we proceed with treatment as quickly as possible, as worsening liver function may make treatment prohibitive soon.  -Recommend pt use Senna w/stool softner as prescribed. Advised pt that narcotics can be especially constipating.  -Plan to begin Carboplatin & reduced-dose Taxol in 4-5 days -Will set up chemo-counseling in 2-3 days  -Will get Brain MRI in 3-4 days -Will get PET/CT in 5-7 days -Will get labs today  -Will see back in 4-5 days with labs   FOLLOW UP: Labs today PET/CT in 5-7 days MRI brain in 3-4 days Chemo-counseling for  carboplatin/taxol in 2-3 days Plz schedule to start carboplatin/taxol in 4-5 days with labs and MD visit   All of the patients questions were answered with apparent satisfaction. The patient knows to call the clinic with any problems, questions or concerns.  I spent 45 mins counseling the patient face to face. The total time spent in the appointment was 60 mins minutes and more than 50%  was on counseling and direct patient cares.    Sullivan Lone MD [place] [facility] Hematology/Oncology Physician [facility]  (Office):       [phone] (Work cell):  [phone] (Fax):           [phone]  06/06/2020 4:32 PM  I, Yevette Edwards, am acting as a scribe for Dr. Sullivan Lone.   .I have reviewed the above documentation for accuracy and completeness, and I agree with the above. Brunetta Genera MD

## 2020-06-06 NOTE — Patient Instructions (Signed)
Thank you for choosing [facility] to provide your oncology and hematology care.   Should you have questions after your visit to the [facility] ([facility]), please contact this office at [phone] between 8:30 AM and 4:30 PM.  Voice mails left after 4:00 PM may not be returned until the following business day.  Calls received after 4:30 PM will be answered by an off-site Nurse Triage Line.    Prescription Refills:  Please have your pharmacy contact us directly for most prescription requests.  Contact the office directly for refills of narcotics (pain medications). Allow 48-72 hours for refills.  Appointments: Please contact the [facility] scheduling department [phone] for questions regarding [facility] appointment scheduling.  Contact the schedulers with any scheduling changes so that your appointment can be rescheduled in a timely manner.   Central Scheduling for [place] [phone] - Call to schedule procedures such as PET scans, CT scans, MRI, Ultrasound, etc.  To afford each patient quality time with our providers, please arrive 30 minutes before your scheduled appointment time.  If you arrive late for your appointment, you may be asked to reschedule.  We strive to give you quality time with our providers, and arriving late affects you and other patients whose appointments are after yours. If you are a no show for multiple scheduled visits, you may be dismissed from the clinic at the providers discretion.     Resources: [facility] Social Workers [phone] for additional information on assistance programs or assistance connecting with community support programs   [place] DSS  [phone]: Information regarding food stamps, Medicaid, and utility assistance [place] Access [place] [phone]   [place] [employer]'s shared-ride transportation service for eligible riders who have a disability that prevents them from riding the fixed route bus.   Medicare  Rights Center 800-333-4114 Helps people with Medicare understand their rights and benefits, navigate the Medicare system, and secure the quality healthcare they deserve [employer] 800-227-2345 Assists patients locate various types of support and financial assistance Cancer Care: 1-800-813-HOPE (4673) Provides financial assistance, online support groups, medication/co-pay assistance.   Transportation Assistance for appointments at [facility]: Transportation Coordinator [phone]  Again, thank you for choosing [facility] for your care.       

## 2020-06-07 ENCOUNTER — Inpatient Hospital Stay: Payer: Medicare Other

## 2020-06-07 LAB — IRON AND TIBC
Iron: 25 ug/dL — ABNORMAL LOW (ref 41–142)
Saturation Ratios: 14 % — ABNORMAL LOW (ref 21–57)
TIBC: 186 ug/dL — ABNORMAL LOW (ref 236–444)
UIBC: 161 ug/dL (ref 120–384)

## 2020-06-07 LAB — FERRITIN: Ferritin: 6018 ng/mL — ABNORMAL HIGH (ref 11–307)

## 2020-06-10 ENCOUNTER — Ambulatory Visit: Payer: Medicare Other | Admitting: Hematology and Oncology

## 2020-06-10 ENCOUNTER — Other Ambulatory Visit: Payer: Medicare Other

## 2020-06-10 DIAGNOSIS — Z7189 Other specified counseling: Secondary | ICD-10-CM | POA: Insufficient documentation

## 2020-06-10 DIAGNOSIS — C801 Malignant (primary) neoplasm, unspecified: Secondary | ICD-10-CM | POA: Insufficient documentation

## 2020-06-10 MED ORDER — DEXAMETHASONE 4 MG PO TABS: 8.0000 mg | ORAL_TABLET | Freq: Every day | ORAL | 1 refills | Status: DC

## 2020-06-10 MED ORDER — PROCHLORPERAZINE MALEATE 10 MG PO TABS: 10.0000 mg | ORAL_TABLET | Freq: Four times a day (QID) | ORAL | 1 refills | Status: DC | PRN

## 2020-06-10 MED ORDER — LIDOCAINE-PRILOCAINE 2.5-2.5 % EX CREA: TOPICAL_CREAM | CUTANEOUS | 3 refills | Status: DC

## 2020-06-10 MED ORDER — ONDANSETRON HCL 8 MG PO TABS: 8.0000 mg | ORAL_TABLET | Freq: Two times a day (BID) | ORAL | 1 refills | Status: DC | PRN

## 2020-06-10 MED ORDER — LORAZEPAM 0.5 MG PO TABS: 0.5000 mg | ORAL_TABLET | Freq: Four times a day (QID) | ORAL | 0 refills | Status: DC | PRN

## 2020-06-10 NOTE — Progress Notes (Signed)
START OFF PATHWAY REGIMEN - Other ° ° °OFF00054:Carboplatin + Paclitaxel (5/200) q21d: °  A cycle is every 21 days: °    Paclitaxel  °    Carboplatin  ° °**Always confirm dose/schedule in your pharmacy ordering system** ° °Patient Characteristics: °Intent of Therapy: °Non-Curative / Palliative Intent, Discussed with Patient °

## 2020-06-11 ENCOUNTER — Ambulatory Visit: Payer: Medicare Other | Admitting: Obstetrics and Gynecology

## 2020-06-12 ENCOUNTER — Telehealth: Payer: Self-pay | Admitting: *Deleted

## 2020-06-12 NOTE — Telephone Encounter (Signed)
Per Dr.Kale's direction: Contacted [facility].  Caryl Pina in [facility] gave fax number [phone]. Faxed requested referral documents [demographics;copy of insurance cards; most recent ov note;labs + pathology reports]  to [facility] - referred to  Dr. Lawanna Kobus. Fax confirmation received.

## 2020-06-12 NOTE — Progress Notes (Signed)
Pharmacist Chemotherapy Monitoring - Initial Assessment    Anticipated start date: 06/13/20   Regimen:  . Are orders appropriate based on the patient's diagnosis, regimen, and cycle? Yes . Does the plan date match the patient's scheduled date? Yes . Is the sequencing of drugs appropriate? Yes . Are the premedications appropriate for the patient's regimen? Yes . Prior Authorization for treatment is: Approved o If applicable, is the correct biosimilar selected based on the patient's insurance? not applicable  Organ Function and Labs: Marland Kitchen Are dose adjustments needed based on the patient's renal function, hepatic function, or hematologic function? Yes . Are appropriate labs ordered prior to the start of patient's treatment? Yes . Other organ system assessment, if indicated: N/A . The following baseline labs, if indicated, have been ordered: N/A  Dose Assessment: . Are the drug doses appropriate? Yes . Are the following correct: o Drug concentrations Yes o IV fluid compatible with drug Yes o Administration routes Yes o Timing of therapy Yes . If applicable, does the patient have documented access for treatment and/or plans for port-a-cath placement? no . If applicable, have lifetime cumulative doses been properly documented and assessed? yes Lifetime Dose Tracking  No doses have been documented on this patient for the following tracked chemicals: Doxorubicin, Epirubicin, Idarubicin, Daunorubicin, Mitoxantrone, Bleomycin, Oxaliplatin, Carboplatin, Liposomal Doxorubicin  o   Toxicity Monitoring/Prevention: . The patient has the following take home antiemetics prescribed: Ondansetron, Prochlorperazine and Dexamethasone . The patient has the following take home medications prescribed: N/A . Medication allergies and previous infusion related reactions, if applicable, have been reviewed and addressed. No . The patient's current medication list has been assessed for drug-drug interactions with  their chemotherapy regimen. no significant drug-drug interactions were identified on review.  Order Review: . Are the treatment plan orders signed? Yes . Is the patient scheduled to see a provider prior to their treatment? Yes  I verify that I have reviewed each item in the above checklist and answered each question accordingly.  Romualdo Bolk [facility] 06/12/2020 12:37 PM

## 2020-06-12 NOTE — Progress Notes (Signed)
HEMATOLOGY/ONCOLOGY CONSULTATION NOTE  Date of Service: 06/13/2020  Patient Care Team: Richarda Osmond, DO as PCP - General  CHIEF COMPLAINTS/PURPOSE OF CONSULTATION:  Newly diagnosed metastatic carcinoma of unknown primary  HISTORY OF PRESENTING ILLNESS:  Candace Griffin is a wonderful 44 y.o. female with a past medical history significant for chronic glomerulonephritis status post renal transplant in July 2019 and currently taking mycophenolate, tacrolimus, and prednisone, anemia, GERD, hypertension, menorrhagia.  The patient presented to the emergency room with chest pain or shortness of breath for several days.  In the emergency room, her hemoglobin was 4.0 (previously 8.6 on 05/15/2020) and chemistry showed a creatinine of 1.13, calcium 8.0, albumin 2.0, AST 336, ALT 105, alkaline phosphatase 242, and T bili 1.5.  2 units of PRBCs have been ordered.  She had a CTA of the chest which was negative for PE or any other acute finding.  CT of the abdomen and pelvis showed redemonstrated inguinal and intra-abdominal lymphadenopathy, multiple liver lesions-findings concerning for metastatic disease, lymphoma, or post transplant lymphoproliferative disease, 2.2 cm native left lower pole renal mass, left lower quadrant transplant kidney with areas of cortical scarring.  Of note, the retroperitoneal, pelvic, and inguinal lymphadenopathy was present on a CT scan performed on 05/16/2020.  She was referred back to her primary care provider for further work-up of the lymphadenopathy.  She states that she was scheduled to see her primary care provider today to follow-up on these results but ended up coming to the emergency room instead.  She has been seen by interventional radiology for for consideration of CT-guided biopsy of one of her inguinal lymph nodes and underwent CT-guided biopsy of the left inguinal lymph node just prior to my visit.  The patient is somewhat sleepy at the time of visit since  she just returned from IR.  She is able to awaken and answer questions.  She states that she has had fatigue and generalized weakness as well as a weight loss of about 20 pounds since the end of August 2021.  She has not been able to eat or drink much of anything secondary to nausea and vomiting.  She reports fevers, but no chills or night sweats.  She is able to palpate the lymph nodes in her inguinal area.  She denies headaches and dizziness.  She has been experiencing chest pain or shortness of breath.  Denies abdominal pain.  She reports significant constipation over the past month.  Denies epistaxis, hemoptysis, hematemesis, hematuria, melena, hematochezia.  Denies lower extremity edema.  Medical oncology was asked to see the patient for recommendations regarding her abnormal CT scan findings and anemia.  INTERVAL HISTORY: Candace Griffin is a wonderful 44 y.o. female who is here for the evaluation and management of metastatic carcinoma of unknown primary. The patient's last visit with [place] was on 06/06/2020. The pt reports that she is doing well overall.  The pt reports that she has not been able to eat much better since our last visit. Her stomach is becoming upset with foods and liquids that she has previously been able to eat and drink. She has been drinking three Premier Protein supplements per day. She continues having abdominal pain and nausea that is controlled with her supportive medications. She denies recent emesis. Pt is having difficulty staying mobile and getting much activity due to fatigue. She endorses a nagging discomfort in her chest that is improved when she lies down.   Lab results today (06/13/20) of CBC  w/diff and CMP is as follows: all values are WNL except for Hgb at 11.3, HCT at 35.2, MCV at 76.2, MCH at 24.5, RDW at 17.3, PLT at 421K, nRBC at 1.0, Mono Abs at 1.6K, Sodium at 134, Chloride at 97, CO2 at 21, Glucose at 144, BUN at 22, Creatinine at 1.02, Albumin at 2.5, AST at  740, ALT at 394, ALP at 558, Total Bilirubin at 3.4, Anion gap at 16. 06/13/2020 Iron Panel is as follows: Iron at 48, TIBC at 194, Sat Ratios at 25, UIBC at 146 06/12/2020 Ferritin at elevated.18k  On review of systems, pt reports fatigue, upper abdominal pain, nausea, dyspepsia, chest discomfort and denies leg swelling, vomiting, constipation and any other symptoms.   MEDICAL HISTORY:  Past Medical History:  Diagnosis Date  . Chronic glomerulonephritis   . Dialysis patient ([place])   . End stage renal disease ([place])   . GERD (gastroesophageal reflux disease)   . History of renal transplant 2011-10-15  . Hypertension     SURGICAL HISTORY: Past Surgical History:  Procedure Laterality Date  . COLPOSCOPY  2016  . diaylsis shunt Right    arm  . KIDNEY TRANSPLANT  10-15-11   deceased donor kidney    SOCIAL HISTORY: Social History   Socioeconomic History  . Marital status: Single    Spouse name: Not on file  . Number of children: 3  . Years of education: 26  . Highest education level: Not on file  Occupational History    Comment: NA  Tobacco Use  . Smoking status: Never Smoker  . Smokeless tobacco: Never Used  Substance and Sexual Activity  . Alcohol use: No  . Drug use: No  . Sexual activity: Not Currently    Birth control/protection: None  Other Topics Concern  . Not on file  Social History Narrative   Lives at home with children   Caffeine - 10/27/16 "addicted to [employer], drank all day long", currently Mtn Dew 12 oz/week   Social Determinants of Health   Financial Resource Strain:   . Difficulty of Paying Living Expenses: Not on file  Food Insecurity:   . Worried About Charity fundraiser in the Last Year: Not on file  . Ran Out of Food in the Last Year: Not on file  Transportation Needs:   . Lack of Transportation (Medical): Not on file  . Lack of Transportation (Non-Medical): Not on file  Physical Activity:   . Days of Exercise per Week: Not on file  . Minutes of  Exercise per Session: Not on file  Stress:   . Feeling of Stress : Not on file  Social Connections:   . Frequency of Communication with Friends and Family: Not on file  . Frequency of Social Gatherings with Friends and Family: Not on file  . Attends Religious Services: Not on file  . Active Member of Clubs or Organizations: Not on file  . Attends Archivist Meetings: Not on file  . Marital Status: Not on file  Intimate Partner Violence:   . Fear of Current or Ex-Partner: Not on file  . Emotionally Abused: Not on file  . Physically Abused: Not on file  . Sexually Abused: Not on file    FAMILY HISTORY: Family History  Problem Relation Age of Onset  . Diabetes Father   . Diabetes Paternal Aunt   . Diabetes Paternal Grandmother   . Alcohol abuse Paternal Grandmother   . Alcohol abuse Paternal Uncle   .  Cancer Maternal Grandmother        breast  . Breast cancer Maternal Grandmother   . Alcohol abuse Paternal Grandfather     ALLERGIES:  is allergic to lisinopril, other, and tape.  MEDICATIONS:  Current Outpatient Medications  Medication Sig Dispense Refill  . cholecalciferol (VITAMIN D3) 25 MCG (1000 UNIT) tablet Take 1,000 Units by mouth daily.    Marland Kitchen dexamethasone (DECADRON) 4 MG tablet Take 2 tablets (8 mg total) by mouth daily. Start the day after carboplatin chemotherapy for 3 days. 30 tablet 1  . lidocaine-prilocaine (EMLA) cream Apply to affected area once 30 g 3  . mycophenolate (CELLCEPT) 250 MG capsule Take 500 mg by mouth 2 (two) times daily.     Marland Kitchen omeprazole (PRILOSEC) 20 MG capsule Take 20 mg by mouth 2 (two) times daily as needed (for heart burn).     . ondansetron (ZOFRAN) 8 MG tablet Take 1 tablet (8 mg total) by mouth every 8 (eight) hours as needed for nausea or vomiting. 30 tablet 0  . ondansetron (ZOFRAN) 8 MG tablet Take 1 tablet (8 mg total) by mouth 2 (two) times daily as needed for refractory nausea / vomiting. Start on day 3 after carboplatin  chemo. 30 tablet 1  . oxyCODONE (ROXICODONE) 5 MG immediate release tablet Take 2 tablets (10 mg total) by mouth every 4 (four) hours as needed for moderate pain or severe pain. 60 tablet 0  . predniSONE (DELTASONE) 5 MG tablet Take 5 mg by mouth daily with breakfast.     . prochlorperazine (COMPAZINE) 10 MG tablet Take 1 tablet (10 mg total) by mouth every 6 (six) hours as needed (Nausea or vomiting). 30 tablet 1  . senna (SENOKOT) 8.6 MG TABS tablet Take 1 tablet (8.6 mg total) by mouth daily as needed for moderate constipation. 90 tablet 0  . Tacrolimus ER (ENVARSUS XR) 1 MG TB24 Take 4 mg by mouth daily.     . fluconazole (DIFLUCAN) 100 MG tablet Take 1 tablet (100 mg total) by mouth daily. 1 tablet 0  . LORazepam (ATIVAN) 0.5 MG tablet Take 1 tablet (0.5 mg total) by mouth every 8 (eight) hours as needed for anxiety. 45 tablet 0  . LORazepam (ATIVAN) 0.5 MG tablet Take 1 tablet (0.5 mg total) by mouth every 6 (six) hours as needed (Nausea or vomiting). (Patient not taking: Reported on 06/13/2020) 30 tablet 0  . pravastatin (PRAVACHOL) 20 MG tablet Take 20 mg by mouth at bedtime.  (Patient not taking: Reported on 06/13/2020)     No current facility-administered medications for this visit.    REVIEW OF SYSTEMS:   A 10+ POINT REVIEW OF SYSTEMS WAS OBTAINED including neurology, dermatology, psychiatry, cardiac, respiratory, lymph, extremities, GI, GU, Musculoskeletal, constitutional, breasts, reproductive, HEENT.  All pertinent positives are noted in the HPI.  All others are negative.   PHYSICAL EXAMINATION: ECOG PERFORMANCE STATUS: 2 - Symptomatic, <50% confined to bed  . Vitals:   06/13/20 0940  BP: 91/73  Pulse: (!) 140  Resp: 19  Temp: (!) 96.2 F (35.7 C)  SpO2: 100%   Filed Weights   06/13/20 0940  Weight: 132 lb 14.4 oz (60.3 kg)   .Body mass index is 20.82 kg/m.  Exam was given in a chair   GENERAL:alert, in no acute distress and comfortable SKIN: no acute rashes, no  significant lesions EYES: conjunctiva are pink and non-injected, sclera anicteric OROPHARYNX: MMM, no exudates, no oropharyngeal erythema or ulceration NECK: supple, no JVD  LYMPH:  no palpable lymphadenopathy in the cervical, axillary or inguinal regions LUNGS: clear to auscultation b/l with normal respiratory effort HEART: regular rate & rhythm ABDOMEN: normoactive bowel sounds, not distended. No palpable hepatosplenomegaly. Tenderness to palpation in epigastric region and RUQ. Extremity: no pedal edema PSYCH: alert & oriented x 3 with fluent speech NEURO: no focal motor/sensory deficits  LABORATORY DATA:  I have reviewed the data as listed  . CBC Latest Ref Rng & Units 06/13/2020 06/06/2020 06/01/2020  WBC 4.0 - 10.5 K/uL 9.5 6.9 4.8  Hemoglobin 12.0 - 15.0 g/dL 11.3(L) 10.7(L) 8.8(L)  Hematocrit 36 - 46 % 35.2(L) 34.0(L) 29.3(L)  Platelets 150 - 400 K/uL 421(H) 487(H) 236    . CMP Latest Ref Rng & Units 06/13/2020 06/06/2020 06/01/2020  Glucose 70 - 99 mg/dL 144(H) 93 95  BUN 6 - 20 mg/dL 22(H) 13 10  Creatinine 0.44 - 1.00 mg/dL 1.02(H) 1.00 1.09(H)  Sodium 135 - 145 mmol/L 134(L) 135 137  Potassium 3.5 - 5.1 mmol/L 4.1 5.3(H) 3.9  Chloride 98 - 111 mmol/L 97(L) 95(L) 106  CO2 22 - 32 mmol/L 21(L) 25 20(L)  Calcium 8.9 - 10.3 mg/dL 10.0 10.1 8.7(L)  Total Protein 6.5 - 8.1 g/dL 7.5 7.8 5.3(L)  Total Bilirubin 0.3 - 1.2 mg/dL 3.4(H) 2.8(H) 2.3(H)  Alkaline Phos 38 - 126 U/L 558(H) 403(H) 287(H)  AST 15 - 41 U/L 740(HH) 68(H) 80(H)  ALT 0 - 44 U/L 394(HH) 41 69(H)       RADIOGRAPHIC STUDIES: I have personally reviewed the radiological images as listed and agreed with the findings in the report. CT ABDOMEN PELVIS WO CONTRAST  Result Date: 05/16/2020 CLINICAL DATA:  Bilious emesis, renal transplant EXAM: CT ABDOMEN AND PELVIS WITHOUT CONTRAST TECHNIQUE: Multidetector CT imaging of the abdomen and pelvis was performed following the standard protocol without IV contrast.  COMPARISON:  None. FINDINGS: Lower chest: No acute abnormality. Hepatobiliary: No focal liver abnormality is seen. No gallstones, gallbladder wall thickening, or biliary dilatation. Pancreas: Unremarkable Spleen: Unremarkable Adrenals/Urinary Tract: The adrenal glands are unremarkable. The native kidneys are markedly atrophic in keeping with changes of chronic renal failure. Previously noted right transplant kidney has been surgically removed and a residual partially calcified complex collection is seen within the resection bed likely representing a postoperative complex seroma or chronic hematoma. A new transplant kidney is seen within the left iliac fossa and is normal in size and demonstrates normal cortical thickness. There is no hydronephrosis. The bladder is unremarkable. Stomach/Bowel: Stomach, small bowel, and large bowel are unremarkable. Appendix normal. No free intraperitoneal gas or fluid. Vascular/Lymphatic: Since the prior examination, there has developed extensive pathologic adenopathy within the left periaortic, aortocaval, and retrocaval lymph node groups, as well as the bilateral common iliac, left external and bilateral inguinal lymph node groups. Bulky adenopathy is compatible with lymphoma. Index lymph node anterior to the abdominal aorta at axial image # 37/3 measures 2.4 x 4.2 cm in greatest dimension. Moderate aortoiliac atherosclerotic calcification is present without evidence of aneurysm. Reproductive: Uterus and bilateral adnexa are unremarkable. Other: Rectum unremarkable. Musculoskeletal: Bone island within the right ischium. No lytic or blastic bone lesions are seen. IMPRESSION: Interval development of extensive retroperitoneal, pelvic, and inguinal lymphadenopathy most in keeping with lymphoma in this immunocompromised patient. Left inguinal and external iliac lymphadenopathy should be easily amenable to ultrasound-guided biopsy for further evaluation. Interval explantation of right  renal transplant and implantation of new transplant kidney within the left iliac fossa. Aortic Atherosclerosis (ICD10-I70.0). Electronically  Signed   By: Fidela Salisbury MD   On: 05/16/2020 05:49   CT Angio Chest PE W and/or Wo Contrast  Result Date: 05/27/2020 CLINICAL DATA:  PE suspected, high probability EXAM: CT ANGIOGRAPHY CHEST WITH CONTRAST TECHNIQUE: Multidetector CT imaging of the chest was performed using the standard protocol during bolus administration of intravenous contrast. Multiplanar CT image reconstructions and MIPs were obtained to evaluate the vascular anatomy. CONTRAST:  83mL OMNIPAQUE IOHEXOL 350 MG/ML SOLN COMPARISON:  None. FINDINGS: Cardiovascular: Satisfactory opacification of the pulmonary arteries to the segmental level. No evidence of pulmonary embolism. Normal heart size. No pericardial effusion. Mediastinum/Nodes: Retrocrural lymphadenopathy, reference abdominal CT. Lungs/Pleura: 2 mm peripheral right upper lobe pulmonary nodule, attention on follow-up. There is no edema, consolidation, effusion, or pneumothorax. Upper Abdomen: Reported on dedicated study. Musculoskeletal: No acute or aggressive finding. Review of the MIP images confirms the above findings. IMPRESSION: Negative for pulmonary embolism or other acute finding. Electronically Signed   By: Monte Fantasia M.D.   On: 05/27/2020 06:02   CT ABDOMEN PELVIS W CONTRAST  Result Date: 05/27/2020 CLINICAL DATA:  Abdominal distension.  Adenopathy. EXAM: CT ABDOMEN AND PELVIS WITH CONTRAST TECHNIQUE: Multidetector CT imaging of the abdomen and pelvis was performed using the standard protocol following bolus administration of intravenous contrast. CONTRAST:  70mL OMNIPAQUE IOHEXOL 350 MG/ML SOLN COMPARISON:  Noncontrast abdominal CT 05/16/2020 FINDINGS: Lower chest:  No contributory findings. Hepatobiliary: Multiple ill-defined masses within the liver. One of the largest is in the subcapsular inferior right lobe on 5:33 at 2  cm.No evidence of biliary obstruction or stone. Pancreas: Unremarkable. Spleen: Unremarkable. Adrenals/Urinary Tract: Negative adrenals. Native renal atrophy. Solid masslike appearance at the left renal hilum which measures 2.2 cm. There is a transplant kidney in the left lower quadrant with scarring involving the left-sided pole. There is pelviectasis without overt hydronephrosis. Failed transplant in the right lower quadrant. Unremarkable bladder. Stomach/Bowel: No obstruction. No inflammatory bowel wall thickening. Vascular/Lymphatic: Bulky retroperitoneal lymphadenopathy. The nodes show areas of low-density and calcification, which would be heterogeneous for untreated lymphoma. For measurement purposes a node ventral to the aorta and cava on 5:44 measures 42 x 26 mm. There is also bilateral inguinal lymphadenopathy including a left inguinal node measuring 16 mm long axis. Adenopathy continues to the retrocural station. Atherosclerotic calcification, premature for age. Reproductive:Intramural fibroids measuring up to 19 mm anteriorly. Other: No ascites or pneumoperitoneum. Musculoskeletal: No acute abnormalities. IMPRESSION: 1. Redemonstrated inguinal and intra-abdominal lymphadenopathy. With the benefit of contrast, multiple liver lesions are also seen. Findings could reflect metastatic disease, lymphoma, or posttransplant lymphoproliferative disease. Left inguinal nodes are the most amenable to excisional biopsy. 2. 2.2 cm native left lower pole renal mass which may be related to#1. 3. Left lower quadrant transplant kidney with areas of cortical scarring. No hydronephrosis or perinephric collection. Electronically Signed   By: Monte Fantasia M.D.   On: 05/27/2020 06:16   US BIOPSY (LIVER)  Result Date: 05/31/2020 INDICATION: 44 year old female with widespread nodal and hepatic metastatic disease of uncertain etiology. She does have a solid endophytic mass in the left renal hilum. Patient recently underwent  ultrasound-guided core biopsy of a metastatic left inguinal lymph node. Biopsy specimens were diagnostic for carcinoma but nonspecific as to tissue source. Therefore, patient presents for repeat biopsy of 1 of the hepatic lesions. EXAM: ULTRASOUND BIOPSY CORE LIVER MEDICATIONS: None. ANESTHESIA/SEDATION: Moderate (conscious) sedation was employed during this procedure. A total of Versed 2 mg and Fentanyl 100 mcg was administered intravenously. Moderate  Sedation Time: 9 minutes. The patient's level of consciousness and vital signs were monitored continuously by radiology nursing throughout the procedure under my direct supervision. FLUOROSCOPY TIME:  None COMPLICATIONS: None immediate. PROCEDURE: Informed written consent was obtained from the patient after a thorough discussion of the procedural risks, benefits and alternatives. All questions were addressed. Maximal Sterile Barrier Technique was utilized including caps, mask, sterile gowns, sterile gloves, sterile drape, hand hygiene and skin antiseptic. A timeout was performed prior to the initiation of the procedure. The liver was interrogated with ultrasound. A well-defined hepatic lesion was successfully identified. A suitable skin entry site was selected and marked. The overlying skin was sterilely prepped and draped in the standard fashion using chlorhexidine skin prep. Local anesthesia was attained by infiltration with 1% lidocaine. A small dermatotomy was made. Under real-time ultrasound guidance, a 17 gauge introducer needle was advanced through the liver and positioned at the margin of the mass. Multiple 18 gauge core biopsies were then obtained coaxially using a bio Pince automated biopsy device. Biopsy specimens were placed in formalin and delivered to pathology for further analysis. As the introducer needle was removed, the biopsy tract was embolized with a Gel-Foam slurry. The patient tolerated the procedure well. IMPRESSION: Technically successful  ultrasound-guided core biopsy of liver lesion. Signed, Criselda Peaches, MD, [facility] Electronically Signed   By: Jacqulynn Cadet M.D.   On: 05/31/2020 16:48   DG Chest Port 1 View  Result Date: 05/26/2020 CLINICAL DATA:  Chest pain and vomiting EXAM: PORTABLE CHEST 1 VIEW COMPARISON:  Chest x-ray 02/03/2015. FINDINGS: The heart size and mediastinal contours are within normal limits. No focal consolidation. No pulmonary edema. No pleural effusion. No pneumothorax. No acute osseous abnormality. IMPRESSION: No active disease. Electronically Signed   By: Iven Finn M.D.   On: 05/26/2020 23:40   [place] CORE BIOPSY (LYMPH NODES)  Result Date: 05/27/2020 INDICATION: History of previous renal transplantation (x2), now with bulky retroperitoneal, pelvic and bilateral inguinal lymphadenopathy as well as indeterminate liver lesions. Please from ultrasound-guided left inguinal lymph node biopsy for tissue diagnostic purposes. EXAM: ULTRASOUND-GUIDED LEFT INGUINAL LYMPH NODE BIOPSY COMPARISON:  CT of the chest, abdomen and pelvis - 05/27/2020 MEDICATIONS: None ANESTHESIA/SEDATION: Moderate (conscious) sedation was employed during this procedure. A total of Versed 1 mg and Fentanyl 50 mcg was administered intravenously. Moderate Sedation Time: 10 minutes. The patient's level of consciousness and vital signs were monitored continuously by radiology nursing throughout the procedure under my direct supervision. COMPLICATIONS: None immediate. TECHNIQUE: Informed written consent was obtained from the patient after a discussion of the risks, benefits and alternatives to treatment. Questions regarding the procedure were encouraged and answered. Initial ultrasound scanning demonstrated an approximately 1.7 x 1.7 x 1.6 cm hypoechoic lymph node within the left groin, correlating with dominant left inguinal lymph node seen on preceding abdominal CT image 85,  series 5. an ultrasound image was saved for documentation purposes. The procedure was planned. A timeout was performed prior to the initiation of the procedure. The operative was prepped and draped in the usual sterile fashion, and a sterile drape was applied covering the operative field. A timeout was performed prior to the initiation of the procedure. Local anesthesia was provided with 1% lidocaine with epinephrine. Under direct ultrasound guidance, an 18 gauge core needle device was utilized to obtain to obtain 8 core needle biopsies of the dominant indeterminate left inguinal lymph node. The samples were placed in saline and submitted  to pathology. The needle was removed and hemostasis was achieved with manual compression. Post procedure scan was negative for significant hematoma. A dressing was placed. The patient tolerated the procedure well without immediate postprocedural complication. IMPRESSION: Technically successful ultrasound guided biopsy of dominant indeterminate left inguinal lymph node. Electronically Signed   By: Sandi Mariscal M.D.   On: 05/27/2020 13:59   US PELVIC COMPLETE WITH TRANSVAGINAL  Result Date: 05/30/2020 CLINICAL DATA:  Metastatic cancer unknown primary EXAM: TRANSABDOMINAL AND TRANSVAGINAL ULTRASOUND OF PELVIS TECHNIQUE: Both transabdominal and transvaginal ultrasound examinations of the pelvis were performed. Transabdominal technique was performed for global imaging of the pelvis including uterus, ovaries, adnexal regions, and pelvic cul-de-sac. It was necessary to proceed with endovaginal exam following the transabdominal exam to visualize the uterus endometrium ovaries. COMPARISON:  CT 05/27/2020 FINDINGS: Uterus Measurements: 10.1 x 6.1 x 5.4 cm = volume: 172.5 mL. Nabothian cysts in the cervix. Left posterior myometrial mass measuring 1.4 x 1.8 x 1.1 cm. Right anterior intramural fundal mass measuring 1.6 x 2.1 x 1.6 cm. Endometrium Thickness: 6 mm.  No focal abnormality  visualized. Right ovary Measurements: 1.5 x 1.8 x 2.1 cm = volume: 3.1 mL. Normal appearance/no adnexal mass. Left ovary Measurements: 1.4 x 1.8 x 1.8 cm = volume: 2.4 mL. Normal appearance/no adnexal mass. Other findings Moderate free fluid in the pelvis. IMPRESSION: 1. Negative for ovarian mass lesion. 2. Uterine fibroids 3. Moderate free fluid in the pelvis Electronically Signed   By: Donavan Foil M.D.   On: 05/30/2020 21:51   US Abdomen Limited RUQ  Result Date: 06/01/2020 CLINICAL DATA:  Liver mass.  Recent biopsy.  Pain post biopsy EXAM: ULTRASOUND ABDOMEN LIMITED RIGHT UPPER QUADRANT COMPARISON:  05/31/2020 FINDINGS: Gallbladder: There is gallbladder sludge without sonographic evidence for acute cholecystitis. There is no gallbladder wall thickening. The sonographic Percell Miller sign is negative. Common bile duct: Diameter: 4 mm Liver: Multiple hepatic lesions are again noted phi, better appreciated on the patient's prior CT. Portal vein is patent on color Doppler imaging with normal direction of blood flow towards the liver. Other: There is a trace amount of perihepatic free fluid. IMPRESSION: Trace perihepatic free fluid. Otherwise, stable exam with multiple hepatic lesions again identified. Electronically Signed   By: Constance Holster M.D.   On: 06/01/2020 03:42    ASSESSMENT & PLAN:   44 yo with   1) Metastatic carcinoma of unknown primary Patient with h/o renal transplantation on tacrolimus, prednisone and cellcept chronic immunosuppression with extensive metastatic disease with extensive abd/pelvic LNadenopathy and hepatic metastases.  IHC from LN and liver metastases -- non specific morphology and IC ? Gyn vs renal tubular origin. Tumor markers unrevealing Component     Latest Ref Rng & Units 05/29/2020 05/31/2020  CEA     0.0 - 4.7 ng/mL 0.9   AFP, Serum, Tumor Marker     0.0 - 8.3 ng/mL 1.7   CA 19-9     0 - 35 U/mL 24   Cancer Antigen (CA) 125     0.0 - 38.1 U/mL 20.1   hCG  Quant     mIU/mL 3   LDH     98 - 192 U/L  178   PLAN: -Discussed pt labwork today, 06/13/20; Hgb is improved, WBC are nml, PLT are steady, kidney numbers are slightly elevated, liver enzymes have skyrocketed, Iron Sat is nml, Ferritin is likely falsely elevated from liver inflammation. -Still awaiting Cancer-ID results - may tell us site of primary disease. -The  pt has no prohibitive toxicities from beginning renally adjusted Carboplatin (AUC 4) & liver adjusted Taxol at this time. -Advised pt that [facility] referral has already been placed. Await contact from them for a second opinion.  -Recommend pt continue using nutritional supplements and eating smaller, more frequent meals.  -Discussed getting a Port-a-cath placement - pt will hold off at this time.  -Continue Senna w/stool softener -Continue supportive medications prn.  -Will refer to our Nutritionist, Ernestene Kiel -Will see back in 10 days with labs for toxicity check    FOLLOW UP: PET/CT and MRI brain need to be scheduled as ordered Referral to Ernestene Kiel for nutritional consultation RTC with Dr Irene Limbo with labs in 10 days for toxicity check Plz schedule C2 of chemotherapy with labs and MD visit on C2D1   The total time spent in the appt was 30 minutes and more than 50% was on counseling and direct patient cares.  All of the patient's questions were answered with apparent satisfaction. The patient knows to call the clinic with any problems, questions or concerns.    Sullivan Lone MD [place] AAHIVMS [facility] Hematology/Oncology Physician [facility]  (Office):       [phone] (Work cell):  [phone] (Fax):           [phone]  06/13/2020 4:11 PM  I, Yevette Edwards, am acting as a scribe for Dr. Sullivan Lone.   .I have reviewed the above documentation for accuracy and completeness, and I agree with the above. Brunetta Genera MD

## 2020-06-12 NOTE — Telephone Encounter (Signed)
Partially favorable decision for EMLA cream.  Duration asked for cannot be approved.  Only allowed for short term use.  Approved for limited amount 30 grams per 30 days from 06/10/2020 through 09/11/2020.  Pharmacy notified.

## 2020-06-13 ENCOUNTER — Inpatient Hospital Stay (HOSPITAL_BASED_OUTPATIENT_CLINIC_OR_DEPARTMENT_OTHER): Payer: Medicare Other | Admitting: Hematology

## 2020-06-13 ENCOUNTER — Ambulatory Visit: Payer: Medicare Other

## 2020-06-13 ENCOUNTER — Inpatient Hospital Stay: Payer: Medicare Other

## 2020-06-13 ENCOUNTER — Other Ambulatory Visit: Payer: Self-pay

## 2020-06-13 ENCOUNTER — Other Ambulatory Visit: Payer: Medicare Other

## 2020-06-13 ENCOUNTER — Telehealth: Payer: Self-pay | Admitting: Hematology

## 2020-06-13 VITALS — BP 97/78 | HR 106 | Temp 97.7°F | Resp 17

## 2020-06-13 VITALS — BP 91/73 | HR 140 | Temp 96.2°F | Resp 19 | Ht 67.0 in | Wt 132.9 lb

## 2020-06-13 DIAGNOSIS — C787 Secondary malignant neoplasm of liver and intrahepatic bile duct: Secondary | ICD-10-CM

## 2020-06-13 DIAGNOSIS — D649 Anemia, unspecified: Secondary | ICD-10-CM

## 2020-06-13 DIAGNOSIS — E43 Unspecified severe protein-calorie malnutrition: Secondary | ICD-10-CM | POA: Diagnosis not present

## 2020-06-13 DIAGNOSIS — C801 Malignant (primary) neoplasm, unspecified: Secondary | ICD-10-CM

## 2020-06-13 DIAGNOSIS — Z7189 Other specified counseling: Secondary | ICD-10-CM

## 2020-06-13 DIAGNOSIS — Z5111 Encounter for antineoplastic chemotherapy: Secondary | ICD-10-CM | POA: Diagnosis not present

## 2020-06-13 DIAGNOSIS — C778 Secondary and unspecified malignant neoplasm of lymph nodes of multiple regions: Secondary | ICD-10-CM

## 2020-06-13 LAB — CBC WITH DIFFERENTIAL/PLATELET
Abs Immature Granulocytes: 0.05 10*3/uL (ref 0.00–0.07)
Basophils Absolute: 0.1 10*3/uL (ref 0.0–0.1)
Basophils Relative: 1 %
Eosinophils Absolute: 0.1 10*3/uL (ref 0.0–0.5)
Eosinophils Relative: 2 %
HCT: 35.2 % — ABNORMAL LOW (ref 36.0–46.0)
Hemoglobin: 11.3 g/dL — ABNORMAL LOW (ref 12.0–15.0)
Immature Granulocytes: 1 %
Lymphocytes Relative: 18 %
Lymphs Abs: 1.7 10*3/uL (ref 0.7–4.0)
MCH: 24.5 pg — ABNORMAL LOW (ref 26.0–34.0)
MCHC: 32.1 g/dL (ref 30.0–36.0)
MCV: 76.2 fL — ABNORMAL LOW (ref 80.0–100.0)
Monocytes Absolute: 1.6 10*3/uL — ABNORMAL HIGH (ref 0.1–1.0)
Monocytes Relative: 17 %
Neutro Abs: 5.9 10*3/uL (ref 1.7–7.7)
Neutrophils Relative %: 61 %
Platelets: 421 10*3/uL — ABNORMAL HIGH (ref 150–400)
RBC: 4.62 MIL/uL (ref 3.87–5.11)
RDW: 17.3 % — ABNORMAL HIGH (ref 11.5–15.5)
WBC: 9.5 10*3/uL (ref 4.0–10.5)
nRBC: 1 % — ABNORMAL HIGH (ref 0.0–0.2)

## 2020-06-13 LAB — CMP (CANCER CENTER ONLY)
ALT: 394 U/L (ref 0–44)
AST: 740 U/L (ref 15–41)
Albumin: 2.5 g/dL — ABNORMAL LOW (ref 3.5–5.0)
Alkaline Phosphatase: 558 U/L — ABNORMAL HIGH (ref 38–126)
Anion gap: 16 — ABNORMAL HIGH (ref 5–15)
BUN: 22 mg/dL — ABNORMAL HIGH (ref 6–20)
CO2: 21 mmol/L — ABNORMAL LOW (ref 22–32)
Calcium: 10 mg/dL (ref 8.9–10.3)
Chloride: 97 mmol/L — ABNORMAL LOW (ref 98–111)
Creatinine: 1.02 mg/dL — ABNORMAL HIGH (ref 0.44–1.00)
GFR, Estimated: >60 mL/min (ref >60)
Glucose, Bld: 144 mg/dL — ABNORMAL HIGH (ref 70–99)
Potassium: 4.1 mmol/L (ref 3.5–5.1)
Sodium: 134 mmol/L — ABNORMAL LOW (ref 135–145)
Total Bilirubin: 3.4 mg/dL — ABNORMAL HIGH (ref 0.3–1.2)
Total Protein: 7.5 g/dL (ref 6.5–8.1)

## 2020-06-13 LAB — IRON AND TIBC
Iron: 48 ug/dL (ref 41–142)
Saturation Ratios: 25 % (ref 21–57)
TIBC: 194 ug/dL — ABNORMAL LOW (ref 236–444)
UIBC: 146 ug/dL (ref 120–384)

## 2020-06-13 LAB — FERRITIN: Ferritin: 18796 ng/mL — ABNORMAL HIGH (ref 11–307)

## 2020-06-13 MED ORDER — DIPHENHYDRAMINE HCL 50 MG/ML IJ SOLN
INTRAMUSCULAR | Status: AC
  Filled 2020-06-13: qty 1

## 2020-06-13 MED ORDER — PALONOSETRON HCL INJECTION 0.25 MG/5ML
INTRAVENOUS | Status: AC
  Filled 2020-06-13: qty 5

## 2020-06-13 MED ORDER — SODIUM CHLORIDE 0.9 % IV SOLN
390.0000 mg | Freq: Once | INTRAVENOUS | Status: AC
  Administered 2020-06-13: 390 mg via INTRAVENOUS
  Filled 2020-06-13: qty 39

## 2020-06-13 MED ORDER — PALONOSETRON HCL INJECTION 0.25 MG/5ML
0.2500 mg | Freq: Once | INTRAVENOUS | Status: AC
  Administered 2020-06-13: 0.25 mg via INTRAVENOUS

## 2020-06-13 MED ORDER — SODIUM CHLORIDE 0.9 % IV SOLN
30.0000 mg/m2 | Freq: Once | INTRAVENOUS | Status: AC
  Administered 2020-06-13: 54 mg via INTRAVENOUS
  Filled 2020-06-13: qty 9

## 2020-06-13 MED ORDER — SODIUM CHLORIDE 0.9 % IV SOLN
150.0000 mg | Freq: Once | INTRAVENOUS | Status: AC
  Administered 2020-06-13: 150 mg via INTRAVENOUS
  Filled 2020-06-13: qty 150

## 2020-06-13 MED ORDER — FAMOTIDINE IN NACL 20-0.9 MG/50ML-% IV SOLN
20.0000 mg | Freq: Once | INTRAVENOUS | Status: AC
  Administered 2020-06-13: 20 mg via INTRAVENOUS

## 2020-06-13 MED ORDER — FAMOTIDINE IN NACL 20-0.9 MG/50ML-% IV SOLN
INTRAVENOUS | Status: AC
  Filled 2020-06-13: qty 50

## 2020-06-13 MED ORDER — SODIUM CHLORIDE 0.9 % IV SOLN
Freq: Once | INTRAVENOUS | Status: AC
  Filled 2020-06-13: qty 250

## 2020-06-13 MED ORDER — DIPHENHYDRAMINE HCL 50 MG/ML IJ SOLN
50.0000 mg | Freq: Once | INTRAMUSCULAR | Status: AC
  Administered 2020-06-13: 50 mg via INTRAVENOUS

## 2020-06-13 MED ORDER — SODIUM CHLORIDE 0.9 % IV SOLN
10.0000 mg | Freq: Once | INTRAVENOUS | Status: AC
  Administered 2020-06-13: 10 mg via INTRAVENOUS
  Filled 2020-06-13: qty 10

## 2020-06-13 NOTE — Telephone Encounter (Signed)
Scheduled per sch msg. Gave printout in infusion

## 2020-06-13 NOTE — Patient Instructions (Signed)
[facility] Discharge Instructions for Patients Receiving Chemotherapy  Today you received the following chemotherapy agents: paclitaxel, carboplatin  To help prevent nausea and vomiting after your treatment, we encourage you to take your nausea medication as prescribed by your physician.    If you develop nausea and vomiting that is not controlled by your nausea medication, call the clinic.   BELOW ARE SYMPTOMS THAT SHOULD BE REPORTED IMMEDIATELY:  *FEVER GREATER THAN 100.5 F  *CHILLS WITH OR WITHOUT FEVER  NAUSEA AND VOMITING THAT IS NOT CONTROLLED WITH YOUR NAUSEA MEDICATION  *UNUSUAL SHORTNESS OF BREATH  *UNUSUAL BRUISING OR BLEEDING  TENDERNESS IN MOUTH AND THROAT WITH OR WITHOUT PRESENCE OF ULCERS  *URINARY PROBLEMS  *BOWEL PROBLEMS  UNUSUAL RASH Items with * indicate a potential emergency and should be followed up as soon as possible.  Feel free to call the clinic should you have any questions or concerns. The clinic phone number is [phone].  Please show the CHEMO ALERT CARD at check-in to the Emergency Department and triage nurse.  Paclitaxel injection What is this medicine? PACLITAXEL (PAK li TAX el) is a chemotherapy drug. It targets fast dividing cells, like cancer cells, and causes these cells to die. This medicine is used to treat ovarian cancer, breast cancer, lung cancer, Kaposi's sarcoma, and other cancers. This medicine may be used for other purposes; ask your health care provider or pharmacist if you have questions. COMMON BRAND NAME(S): Onxol, Taxol What should I tell my health care provider before I take this medicine? They need to know if you have any of these conditions:  history of irregular heartbeat  liver disease  low blood counts, like low white cell, platelet, or red cell counts  lung or breathing disease, like asthma  tingling of the fingers or toes, or other nerve disorder  an unusual or allergic reaction to  paclitaxel, alcohol, polyoxyethylated castor oil, other chemotherapy, other medicines, foods, dyes, or preservatives  pregnant or trying to get pregnant  breast-feeding How should I use this medicine? This drug is given as an infusion into a vein. It is administered in a hospital or clinic by a specially trained health care professional. Talk to your pediatrician regarding the use of this medicine in children. Special care may be needed. Overdosage: If you think you have taken too much of this medicine contact a poison control center or emergency room at once. NOTE: This medicine is only for you. Do not share this medicine with others. What if I miss a dose? It is important not to miss your dose. Call your doctor or health care professional if you are unable to keep an appointment. What may interact with this medicine? Do not take this medicine with any of the following medications:  disulfiram  metronidazole This medicine may also interact with the following medications:  antiviral medicines for hepatitis, HIV or AIDS  certain antibiotics like erythromycin and clarithromycin  certain medicines for fungal infections like ketoconazole and itraconazole  certain medicines for seizures like carbamazepine, phenobarbital, phenytoin  gemfibrozil  nefazodone  rifampin  St. John's wort This list may not describe all possible interactions. Give your health care provider a list of all the medicines, herbs, non-prescription drugs, or dietary supplements you use. Also tell them if you smoke, drink alcohol, or use illegal drugs. Some items may interact with your medicine. What should I watch for while using this medicine? Your condition will be monitored carefully while you are receiving this medicine. You   will need important blood work done while you are taking this medicine. This medicine can cause serious allergic reactions. To reduce your risk you will need to take other medicine(s)  before treatment with this medicine. If you experience allergic reactions like skin rash, itching or hives, swelling of the face, lips, or tongue, tell your doctor or health care professional right away. In some cases, you may be given additional medicines to help with side effects. Follow all directions for their use. This drug may make you feel generally unwell. This is not uncommon, as chemotherapy can affect healthy cells as well as cancer cells. Report any side effects. Continue your course of treatment even though you feel ill unless your doctor tells you to stop. Call your doctor or health care professional for advice if you get a fever, chills or sore throat, or other symptoms of a cold or flu. Do not treat yourself. This drug decreases your body's ability to fight infections. Try to avoid being around people who are sick. This medicine may increase your risk to bruise or bleed. Call your doctor or health care professional if you notice any unusual bleeding. Be careful brushing and flossing your teeth or using a toothpick because you may get an infection or bleed more easily. If you have any dental work done, tell your dentist you are receiving this medicine. Avoid taking products that contain aspirin, acetaminophen, ibuprofen, naproxen, or ketoprofen unless instructed by your doctor. These medicines may hide a fever. Do not become pregnant while taking this medicine. Women should inform their doctor if they wish to become pregnant or think they might be pregnant. There is a potential for serious side effects to an unborn child. Talk to your health care professional or pharmacist for more information. Do not breast-feed an infant while taking this medicine. Men are advised not to father a child while receiving this medicine. This product may contain alcohol. Ask your pharmacist or healthcare provider if this medicine contains alcohol. Be sure to tell all healthcare providers you are taking this  medicine. Certain medicines, like metronidazole and disulfiram, can cause an unpleasant reaction when taken with alcohol. The reaction includes flushing, headache, nausea, vomiting, sweating, and increased thirst. The reaction can last from 30 minutes to several hours. What side effects may I notice from receiving this medicine? Side effects that you should report to your doctor or health care professional as soon as possible:  allergic reactions like skin rash, itching or hives, swelling of the face, lips, or tongue  breathing problems  changes in vision  fast, irregular heartbeat  high or low blood pressure  mouth sores  pain, tingling, numbness in the hands or feet  signs of decreased platelets or bleeding - bruising, pinpoint red spots on the skin, black, tarry stools, blood in the urine  signs of decreased red blood cells - unusually weak or tired, feeling faint or lightheaded, falls  signs of infection - fever or chills, cough, sore throat, pain or difficulty passing urine  signs and symptoms of liver injury like dark yellow or brown urine; general ill feeling or flu-like symptoms; light-colored stools; loss of appetite; nausea; right upper belly pain; unusually weak or tired; yellowing of the eyes or skin  swelling of the ankles, feet, hands  unusually slow heartbeat Side effects that usually do not require medical attention (report to your doctor or health care professional if they continue or are bothersome):  diarrhea  hair loss  loss of appetite    muscle or joint pain  nausea, vomiting  pain, redness, or irritation at site where injected  tiredness This list may not describe all possible side effects. Call your doctor for medical advice about side effects. You may report side effects to FDA at 1-800-FDA-1088. Where should I keep my medicine? This drug is given in a hospital or clinic and will not be stored at home. NOTE: This sheet is a summary. It may not  cover all possible information. If you have questions about this medicine, talk to your doctor, pharmacist, or health care provider.  2020 Elsevier/Gold Standard (2017-04-13 13:14:55)  Carboplatin injection What is this medicine? CARBOPLATIN (KAR boe pla tin) is a chemotherapy drug. It targets fast dividing cells, like cancer cells, and causes these cells to die. This medicine is used to treat ovarian cancer and many other cancers. This medicine may be used for other purposes; ask your health care provider or pharmacist if you have questions. COMMON BRAND NAME(S): Paraplatin What should I tell my health care provider before I take this medicine? They need to know if you have any of these conditions:  blood disorders  hearing problems  kidney disease  recent or ongoing radiation therapy  an unusual or allergic reaction to carboplatin, cisplatin, other chemotherapy, other medicines, foods, dyes, or preservatives  pregnant or trying to get pregnant  breast-feeding How should I use this medicine? This drug is usually given as an infusion into a vein. It is administered in a hospital or clinic by a specially trained health care professional. Talk to your pediatrician regarding the use of this medicine in children. Special care may be needed. Overdosage: If you think you have taken too much of this medicine contact a poison control center or emergency room at once. NOTE: This medicine is only for you. Do not share this medicine with others. What if I miss a dose? It is important not to miss a dose. Call your doctor or health care professional if you are unable to keep an appointment. What may interact with this medicine?  medicines for seizures  medicines to increase blood counts like filgrastim, pegfilgrastim, sargramostim  some antibiotics like amikacin, gentamicin, neomycin, streptomycin, tobramycin  vaccines Talk to your doctor or health care professional before taking any of  these medicines:  acetaminophen  aspirin  ibuprofen  ketoprofen  naproxen This list may not describe all possible interactions. Give your health care provider a list of all the medicines, herbs, non-prescription drugs, or dietary supplements you use. Also tell them if you smoke, drink alcohol, or use illegal drugs. Some items may interact with your medicine. What should I watch for while using this medicine? Your condition will be monitored carefully while you are receiving this medicine. You will need important blood work done while you are taking this medicine. This drug may make you feel generally unwell. This is not uncommon, as chemotherapy can affect healthy cells as well as cancer cells. Report any side effects. Continue your course of treatment even though you feel ill unless your doctor tells you to stop. In some cases, you may be given additional medicines to help with side effects. Follow all directions for their use. Call your doctor or health care professional for advice if you get a fever, chills or sore throat, or other symptoms of a cold or flu. Do not treat yourself. This drug decreases your body's ability to fight infections. Try to avoid being around people who are sick. This medicine may increase your   risk to bruise or bleed. Call your doctor or health care professional if you notice any unusual bleeding. Be careful brushing and flossing your teeth or using a toothpick because you may get an infection or bleed more easily. If you have any dental work done, tell your dentist you are receiving this medicine. Avoid taking products that contain aspirin, acetaminophen, ibuprofen, naproxen, or ketoprofen unless instructed by your doctor. These medicines may hide a fever. Do not become pregnant while taking this medicine. Women should inform their doctor if they wish to become pregnant or think they might be pregnant. There is a potential for serious side effects to an unborn child.  Talk to your health care professional or pharmacist for more information. Do not breast-feed an infant while taking this medicine. What side effects may I notice from receiving this medicine? Side effects that you should report to your doctor or health care professional as soon as possible:  allergic reactions like skin rash, itching or hives, swelling of the face, lips, or tongue  signs of infection - fever or chills, cough, sore throat, pain or difficulty passing urine  signs of decreased platelets or bleeding - bruising, pinpoint red spots on the skin, black, tarry stools, nosebleeds  signs of decreased red blood cells - unusually weak or tired, fainting spells, lightheadedness  breathing problems  changes in hearing  changes in vision  chest pain  high blood pressure  low blood counts - This drug may decrease the number of white blood cells, red blood cells and platelets. You may be at increased risk for infections and bleeding.  nausea and vomiting  pain, swelling, redness or irritation at the injection site  pain, tingling, numbness in the hands or feet  problems with balance, talking, walking  trouble passing urine or change in the amount of urine Side effects that usually do not require medical attention (report to your doctor or health care professional if they continue or are bothersome):  hair loss  loss of appetite  metallic taste in the mouth or changes in taste This list may not describe all possible side effects. Call your doctor for medical advice about side effects. You may report side effects to FDA at 1-800-FDA-1088. Where should I keep my medicine? This drug is given in a hospital or clinic and will not be stored at home. NOTE: This sheet is a summary. It may not cover all possible information. If you have questions about this medicine, talk to your doctor, pharmacist, or health care provider.  2020 Elsevier/Gold Standard (2007-11-15 14:38:05)   

## 2020-06-13 NOTE — Progress Notes (Signed)
Per lab pt needs orders entered

## 2020-06-13 NOTE — Progress Notes (Signed)
Per Dr. Irene Limbo, ok to proceed with HR 129, AST 740, ALT 394, and total bili 3.4.

## 2020-06-13 NOTE — Progress Notes (Signed)
CRITICAL VALUE STICKER  CRITICAL VALUE:ASt 740/ALT 148  DGNPHQNE (on-site recipient of call):S.Jessee Avers  DATE & TIME NOTIFIED: 06/13/20 @10 :20am  MESSENGER (representative from lab):  MD NOTIFIED: Dr.Kale  TIME OF NOTIFICATION:10:21  RESPONSE: MD aware

## 2020-06-15 ENCOUNTER — Inpatient Hospital Stay: Payer: Medicare Other

## 2020-06-15 VITALS — BP 99/71 | HR 114 | Temp 97.8°F | Resp 18

## 2020-06-15 DIAGNOSIS — C787 Secondary malignant neoplasm of liver and intrahepatic bile duct: Secondary | ICD-10-CM

## 2020-06-15 DIAGNOSIS — C778 Secondary and unspecified malignant neoplasm of lymph nodes of multiple regions: Secondary | ICD-10-CM

## 2020-06-15 DIAGNOSIS — Z7189 Other specified counseling: Secondary | ICD-10-CM

## 2020-06-15 DIAGNOSIS — Z5111 Encounter for antineoplastic chemotherapy: Secondary | ICD-10-CM | POA: Diagnosis not present

## 2020-06-15 DIAGNOSIS — C801 Malignant (primary) neoplasm, unspecified: Secondary | ICD-10-CM

## 2020-06-15 MED ORDER — PEGFILGRASTIM-JMDB 6 MG/0.6ML ~~LOC~~ SOSY
6.0000 mg | PREFILLED_SYRINGE | Freq: Once | SUBCUTANEOUS | Status: AC
  Administered 2020-06-15: 6 mg via SUBCUTANEOUS

## 2020-06-15 NOTE — Patient Instructions (Signed)

## 2020-06-18 ENCOUNTER — Inpatient Hospital Stay: Payer: Medicare Other

## 2020-06-18 NOTE — Progress Notes (Signed)
Nutrition Assessment   Reason for Assessment:  Weight loss, poor appetite   ASSESSMENT:  44 year old female with metastatic carcinoma of unknown primary, likely renal or gyn.  Past medical history of renal transplant, renal mass, HTN, GERD, not currently on dialysis.  Planning to start chemotherapy.   Spoke with patient via phone for nutrition assessment.  Patient reports appetite was severely low several weeks ago.  Over the last 4-5 days appetite has improved.  Yesterday was able to eat 1/2 of hot dog and 7 fries for lunch. Dinner was chicken thigh and few bites of corn.  She has been drinking premier protein shakes and drinking water and some gatorade.  Bowel have been moving. Drinks chocolate milk to get her bowels to move.  Denies nausea or vomiting.  Reports feels full and can't eat much at one time.      Medications: reviewed   Labs: reviewed   Anthropometrics:   Height: 67 inches Weight: 132 lb 14.4 oz (10/21) UBW: 169 lb in August 2021 BMI: 20  22% weight loss in the last 2 months, signifcant   Estimated Energy Needs  Kcals: 1800-2100 Protein: 90-105 g Fluid: > 1.8 L   NUTRITION DIAGNOSIS: Inadequate oral intake related to cancer as evidenced by 22% weight loss in the last 2 months and poor appetite    INTERVENTION:  Encouraged small frequent meals Encouraged trying higher calorie shake (ensure plus, boost plus) Discussed ways to increase calories and protein in diet    MONITORING, EVALUATION, GOAL: weight trends, intake   Next Visit: Thursday, Nov 11 during infusion  Nova Schmuhl B. Zenia Resides, [place], [place] Registered Dietitian [phone] (mobile)

## 2020-06-21 ENCOUNTER — Telehealth: Payer: Self-pay | Admitting: Pharmacist

## 2020-06-21 ENCOUNTER — Telehealth: Payer: Self-pay

## 2020-06-21 ENCOUNTER — Other Ambulatory Visit: Payer: Self-pay | Admitting: Hematology

## 2020-06-21 DIAGNOSIS — C649 Malignant neoplasm of unspecified kidney, except renal pelvis: Secondary | ICD-10-CM

## 2020-06-21 DIAGNOSIS — E039 Hypothyroidism, unspecified: Secondary | ICD-10-CM

## 2020-06-21 MED ORDER — CABOMETYX 40 MG PO TABS: 40.0000 mg | ORAL_TABLET | Freq: Every day | ORAL | 1 refills | Status: DC

## 2020-06-21 NOTE — Telephone Encounter (Signed)
Oral Oncology Pharmacist Encounter  Received new prescription for Cabometyx (cabozantinib) for the treatment of metastatic renal cell carcinoma, planned duration until disease progression or unacceptable drug toxicity.  Prescription dose and frequency assessed for appropriateness. Pending patient tolerance, dose of Cabometyx may be increased to 60 mg by mouth once daily per MD.   CBC w/ Diff and CMP from 06/13/20 assessed, notable significant LFT elevation (calculated Child Pugh Class B) - current dose of Cabometyx appropriate for baseline hepatic impairment. Repeat labs being scheduled prior to therapy initiation to include TSH, urine protein/ creatinine, CMP and CBC w/ Diff  Current medication list in Epic reviewed, no significant/relevant DDIs with Cabometyx identified.  Evaluated chart and no patient barriers to medication adherence noted.   Prescription has been e-scribed to the [facility] for benefits analysis and approval.  Oral Oncology Clinic will continue to follow for insurance authorization, copayment issues, initial counseling and start date.  Leron Croak, PharmD, BCPS Hematology/Oncology Clinical Pharmacist [facility] [phone] 06/21/2020 4:24 PM

## 2020-06-21 NOTE — Telephone Encounter (Signed)
Oral Oncology Patient Advocate Encounter  Received notification from [facility] that prior authorization for Cabometyx is required.  PA submitted on CoverMyMeds Key [id number] Status is pending  Oral Oncology Clinic will continue to follow.  [place] Patient [place] Phone [phone] Fax [phone] 06/21/2020 3:55 PM

## 2020-06-21 NOTE — Progress Notes (Signed)
Called and discussed cancer tab ID molecular cancer classifier results with the patient. 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 Plan -We will discontinue cycle 2 of carboplatin Taxol chemotherapy. - I have sent a prescription for Cabometyx 40 mg p.o. daily to [facility] outpatient pharmacy to start as monotherapy given her abnormal liver functions.  We shall plan to increase to 60 mg p.o. daily if her labs are stable. -We will get baseline labs including CBC, CMP, TSH, free T4, urine protein creatinine ratio and see patient in clinic in 1 week -Patient agreeable with this plan.  Brunetta Genera

## 2020-06-24 ENCOUNTER — Telehealth: Payer: Self-pay | Admitting: Hematology

## 2020-06-24 ENCOUNTER — Telehealth: Payer: Self-pay | Admitting: *Deleted

## 2020-06-24 NOTE — Telephone Encounter (Signed)
Scheduled appt per 11/1 sch msg - pt is aware of appt date and time.  ° °

## 2020-06-24 NOTE — Telephone Encounter (Signed)
Contacted Radiology Scheduling to inquire about scheduling PET/MRI. They stated their records show they contacted patient x 2 - LVM to call to schedule scans. Contacted patient, gave number for Radiology Scheduling and encouraged her to call and schedule scans. She verbalized understanding of information.

## 2020-06-24 NOTE — Telephone Encounter (Signed)
Oral Oncology Patient Advocate Encounter  Prior Authorization for Cabometyx has been approved.    PA# [id number] Effective dates: 06/21/20 through until further notice  Patients co-pay is $4  Oral Oncology Clinic will continue to follow.    [place] Patient [place] Phone [phone] Fax [phone] 06/24/2020 8:32 AM

## 2020-06-26 ENCOUNTER — Inpatient Hospital Stay (HOSPITAL_COMMUNITY)
Admission: EM | Admit: 2020-06-26 | Discharge: 2020-07-16 | DRG: 438 | Disposition: A | Discharge status: Other Acute Inpatient Hospital | Payer: Medicare Other | Attending: Internal Medicine | Admitting: Internal Medicine

## 2020-06-26 ENCOUNTER — Other Ambulatory Visit: Payer: Self-pay

## 2020-06-26 ENCOUNTER — Emergency Department (HOSPITAL_COMMUNITY): Payer: Medicare Other

## 2020-06-26 ENCOUNTER — Inpatient Hospital Stay: Payer: Medicare Other | Attending: Hematology and Oncology | Admitting: Hematology

## 2020-06-26 ENCOUNTER — Encounter (HOSPITAL_COMMUNITY): Payer: Self-pay

## 2020-06-26 ENCOUNTER — Inpatient Hospital Stay: Payer: Medicare Other

## 2020-06-26 VITALS — BP 115/64 | HR 148 | Temp 97.4°F | Resp 24

## 2020-06-26 DIAGNOSIS — C778 Secondary and unspecified malignant neoplasm of lymph nodes of multiple regions: Secondary | ICD-10-CM

## 2020-06-26 DIAGNOSIS — I10 Essential (primary) hypertension: Secondary | ICD-10-CM | POA: Diagnosis present

## 2020-06-26 DIAGNOSIS — D84821 Immunodeficiency due to drugs: Secondary | ICD-10-CM | POA: Diagnosis not present

## 2020-06-26 DIAGNOSIS — E861 Hypovolemia: Secondary | ICD-10-CM | POA: Diagnosis present

## 2020-06-26 DIAGNOSIS — C7951 Secondary malignant neoplasm of bone: Secondary | ICD-10-CM | POA: Diagnosis present

## 2020-06-26 DIAGNOSIS — Z803 Family history of malignant neoplasm of breast: Secondary | ICD-10-CM

## 2020-06-26 DIAGNOSIS — C787 Secondary malignant neoplasm of liver and intrahepatic bile duct: Secondary | ICD-10-CM

## 2020-06-26 DIAGNOSIS — R1084 Generalized abdominal pain: Secondary | ICD-10-CM | POA: Diagnosis present

## 2020-06-26 DIAGNOSIS — R000 Tachycardia, unspecified: Secondary | ICD-10-CM | POA: Diagnosis present

## 2020-06-26 DIAGNOSIS — C649 Malignant neoplasm of unspecified kidney, except renal pelvis: Secondary | ICD-10-CM

## 2020-06-26 DIAGNOSIS — R638 Other symptoms and signs concerning food and fluid intake: Secondary | ICD-10-CM | POA: Diagnosis not present

## 2020-06-26 DIAGNOSIS — R4182 Altered mental status, unspecified: Secondary | ICD-10-CM | POA: Diagnosis not present

## 2020-06-26 DIAGNOSIS — K859 Acute pancreatitis without necrosis or infection, unspecified: Principal | ICD-10-CM | POA: Diagnosis present

## 2020-06-26 DIAGNOSIS — R579 Shock, unspecified: Secondary | ICD-10-CM | POA: Diagnosis not present

## 2020-06-26 DIAGNOSIS — J189 Pneumonia, unspecified organism: Secondary | ICD-10-CM | POA: Diagnosis not present

## 2020-06-26 DIAGNOSIS — Z833 Family history of diabetes mellitus: Secondary | ICD-10-CM

## 2020-06-26 DIAGNOSIS — R109 Unspecified abdominal pain: Secondary | ICD-10-CM

## 2020-06-26 DIAGNOSIS — D630 Anemia in neoplastic disease: Secondary | ICD-10-CM | POA: Diagnosis present

## 2020-06-26 DIAGNOSIS — R197 Diarrhea, unspecified: Secondary | ICD-10-CM | POA: Diagnosis not present

## 2020-06-26 DIAGNOSIS — N039 Chronic nephritic syndrome with unspecified morphologic changes: Secondary | ICD-10-CM | POA: Diagnosis present

## 2020-06-26 DIAGNOSIS — N179 Acute kidney failure, unspecified: Secondary | ICD-10-CM

## 2020-06-26 DIAGNOSIS — R0600 Dyspnea, unspecified: Secondary | ICD-10-CM | POA: Diagnosis not present

## 2020-06-26 DIAGNOSIS — M79609 Pain in unspecified limb: Secondary | ICD-10-CM | POA: Diagnosis not present

## 2020-06-26 DIAGNOSIS — K759 Inflammatory liver disease, unspecified: Secondary | ICD-10-CM

## 2020-06-26 DIAGNOSIS — D72829 Elevated white blood cell count, unspecified: Secondary | ICD-10-CM | POA: Diagnosis not present

## 2020-06-26 DIAGNOSIS — R6521 Severe sepsis with septic shock: Secondary | ICD-10-CM | POA: Diagnosis not present

## 2020-06-26 DIAGNOSIS — R002 Palpitations: Secondary | ICD-10-CM | POA: Diagnosis not present

## 2020-06-26 DIAGNOSIS — R112 Nausea with vomiting, unspecified: Secondary | ICD-10-CM

## 2020-06-26 DIAGNOSIS — G9340 Encephalopathy, unspecified: Secondary | ICD-10-CM | POA: Diagnosis not present

## 2020-06-26 DIAGNOSIS — Z940 Kidney transplant status: Secondary | ICD-10-CM | POA: Diagnosis not present

## 2020-06-26 DIAGNOSIS — I959 Hypotension, unspecified: Secondary | ICD-10-CM | POA: Diagnosis not present

## 2020-06-26 DIAGNOSIS — E875 Hyperkalemia: Secondary | ICD-10-CM | POA: Diagnosis present

## 2020-06-26 DIAGNOSIS — Z20822 Contact with and (suspected) exposure to covid-19: Secondary | ICD-10-CM | POA: Diagnosis present

## 2020-06-26 DIAGNOSIS — Z452 Encounter for adjustment and management of vascular access device: Secondary | ICD-10-CM

## 2020-06-26 DIAGNOSIS — E440 Moderate protein-calorie malnutrition: Secondary | ICD-10-CM | POA: Diagnosis present

## 2020-06-26 DIAGNOSIS — R1114 Bilious vomiting: Secondary | ICD-10-CM | POA: Diagnosis not present

## 2020-06-26 DIAGNOSIS — E039 Hypothyroidism, unspecified: Secondary | ICD-10-CM

## 2020-06-26 DIAGNOSIS — C775 Secondary and unspecified malignant neoplasm of intrapelvic lymph nodes: Secondary | ICD-10-CM | POA: Diagnosis not present

## 2020-06-26 DIAGNOSIS — R1110 Vomiting, unspecified: Secondary | ICD-10-CM | POA: Diagnosis not present

## 2020-06-26 DIAGNOSIS — R6520 Severe sepsis without septic shock: Secondary | ICD-10-CM

## 2020-06-26 DIAGNOSIS — C799 Secondary malignant neoplasm of unspecified site: Secondary | ICD-10-CM

## 2020-06-26 DIAGNOSIS — D849 Immunodeficiency, unspecified: Secondary | ICD-10-CM | POA: Diagnosis not present

## 2020-06-26 DIAGNOSIS — Y95 Nosocomial condition: Secondary | ICD-10-CM | POA: Diagnosis not present

## 2020-06-26 DIAGNOSIS — K219 Gastro-esophageal reflux disease without esophagitis: Secondary | ICD-10-CM | POA: Diagnosis present

## 2020-06-26 DIAGNOSIS — F419 Anxiety disorder, unspecified: Secondary | ICD-10-CM | POA: Diagnosis present

## 2020-06-26 DIAGNOSIS — I700 Atherosclerosis of aorta: Secondary | ICD-10-CM | POA: Diagnosis present

## 2020-06-26 DIAGNOSIS — E872 Acidosis: Secondary | ICD-10-CM | POA: Diagnosis present

## 2020-06-26 DIAGNOSIS — R739 Hyperglycemia, unspecified: Secondary | ICD-10-CM | POA: Diagnosis not present

## 2020-06-26 DIAGNOSIS — Z7952 Long term (current) use of systemic steroids: Secondary | ICD-10-CM

## 2020-06-26 DIAGNOSIS — Z91048 Other nonmedicinal substance allergy status: Secondary | ICD-10-CM

## 2020-06-26 DIAGNOSIS — E860 Dehydration: Secondary | ICD-10-CM | POA: Diagnosis present

## 2020-06-26 DIAGNOSIS — Z681 Body mass index (BMI) 19 or less, adult: Secondary | ICD-10-CM | POA: Diagnosis not present

## 2020-06-26 DIAGNOSIS — A419 Sepsis, unspecified organism: Secondary | ICD-10-CM

## 2020-06-26 DIAGNOSIS — I361 Nonrheumatic tricuspid (valve) insufficiency: Secondary | ICD-10-CM | POA: Diagnosis not present

## 2020-06-26 DIAGNOSIS — Z888 Allergy status to other drugs, medicaments and biological substances status: Secondary | ICD-10-CM

## 2020-06-26 DIAGNOSIS — R0602 Shortness of breath: Secondary | ICD-10-CM

## 2020-06-26 DIAGNOSIS — C801 Malignant (primary) neoplasm, unspecified: Secondary | ICD-10-CM

## 2020-06-26 DIAGNOSIS — Z79899 Other long term (current) drug therapy: Secondary | ICD-10-CM

## 2020-06-26 DIAGNOSIS — Z811 Family history of alcohol abuse and dependence: Secondary | ICD-10-CM

## 2020-06-26 DIAGNOSIS — K1230 Oral mucositis (ulcerative), unspecified: Secondary | ICD-10-CM | POA: Diagnosis present

## 2020-06-26 DIAGNOSIS — D649 Anemia, unspecified: Secondary | ICD-10-CM | POA: Diagnosis not present

## 2020-06-26 DIAGNOSIS — E8770 Fluid overload, unspecified: Secondary | ICD-10-CM | POA: Diagnosis present

## 2020-06-26 DIAGNOSIS — Z5111 Encounter for antineoplastic chemotherapy: Secondary | ICD-10-CM

## 2020-06-26 DIAGNOSIS — M79652 Pain in left thigh: Secondary | ICD-10-CM | POA: Diagnosis not present

## 2020-06-26 DIAGNOSIS — M79651 Pain in right thigh: Secondary | ICD-10-CM | POA: Diagnosis not present

## 2020-06-26 LAB — CBC WITH DIFFERENTIAL/PLATELET
Abs Immature Granulocytes: 0.91 10*3/uL — ABNORMAL HIGH (ref 0.00–0.07)
Abs Immature Granulocytes: 1.21 10*3/uL — ABNORMAL HIGH (ref 0.00–0.07)
Basophils Absolute: 0.1 10*3/uL (ref 0.0–0.1)
Basophils Absolute: 0.1 10*3/uL (ref 0.0–0.1)
Basophils Relative: 0 %
Basophils Relative: 0 %
Eosinophils Absolute: 0 10*3/uL (ref 0.0–0.5)
Eosinophils Absolute: 0 10*3/uL (ref 0.0–0.5)
Eosinophils Relative: 0 %
Eosinophils Relative: 0 %
HCT: 38.7 % (ref 36.0–46.0)
HCT: 39.4 % (ref 36.0–46.0)
Hemoglobin: 12.1 g/dL (ref 12.0–15.0)
Hemoglobin: 11.9 g/dL — ABNORMAL LOW (ref 12.0–15.0)
Immature Granulocytes: 4 %
Immature Granulocytes: 5 %
Lymphocytes Relative: 6 %
Lymphocytes Relative: 4 %
Lymphs Abs: 1.4 10*3/uL (ref 0.7–4.0)
Lymphs Abs: 1 10*3/uL (ref 0.7–4.0)
MCH: 24.9 pg — ABNORMAL LOW (ref 26.0–34.0)
MCH: 25.9 pg — ABNORMAL LOW (ref 26.0–34.0)
MCHC: 31.3 g/dL (ref 30.0–36.0)
MCHC: 30.2 g/dL (ref 30.0–36.0)
MCV: 79.6 fL — ABNORMAL LOW (ref 80.0–100.0)
MCV: 85.8 fL (ref 80.0–100.0)
Monocytes Absolute: 1.1 10*3/uL — ABNORMAL HIGH (ref 0.1–1.0)
Monocytes Absolute: 1.3 10*3/uL — ABNORMAL HIGH (ref 0.1–1.0)
Monocytes Relative: 5 %
Monocytes Relative: 5 %
Neutro Abs: 18.7 10*3/uL — ABNORMAL HIGH (ref 1.7–7.7)
Neutro Abs: 21.5 10*3/uL — ABNORMAL HIGH (ref 1.7–7.7)
Neutrophils Relative %: 85 %
Neutrophils Relative %: 86 %
Platelets: 205 10*3/uL (ref 150–400)
Platelets: 205 10*3/uL (ref 150–400)
RBC: 4.86 MIL/uL (ref 3.87–5.11)
RBC: 4.59 MIL/uL (ref 3.87–5.11)
RDW: 22.2 % — ABNORMAL HIGH (ref 11.5–15.5)
RDW: 23.4 % — ABNORMAL HIGH (ref 11.5–15.5)
WBC: 22.2 10*3/uL — ABNORMAL HIGH (ref 4.0–10.5)
WBC: 25.1 10*3/uL — ABNORMAL HIGH (ref 4.0–10.5)
nRBC: 7.8 % — ABNORMAL HIGH (ref 0.0–0.2)
nRBC: 6.3 % — ABNORMAL HIGH (ref 0.0–0.2)

## 2020-06-26 LAB — CMP (CANCER CENTER ONLY)
ALT: 665 U/L (ref 0–44)
AST: 1687 U/L (ref 15–41)
Albumin: 2.9 g/dL — ABNORMAL LOW (ref 3.5–5.0)
Alkaline Phosphatase: 767 U/L — ABNORMAL HIGH (ref 38–126)
Anion gap: 22 — ABNORMAL HIGH (ref 5–15)
BUN: 35 mg/dL — ABNORMAL HIGH (ref 6–20)
CO2: 20 mmol/L — ABNORMAL LOW (ref 22–32)
Calcium: 10.2 mg/dL (ref 8.9–10.3)
Chloride: 98 mmol/L (ref 98–111)
Creatinine: 1.38 mg/dL — ABNORMAL HIGH (ref 0.44–1.00)
GFR, Estimated: 49 mL/min — ABNORMAL LOW (ref >60)
Glucose, Bld: 175 mg/dL — ABNORMAL HIGH (ref 70–99)
Potassium: 4.7 mmol/L (ref 3.5–5.1)
Sodium: 140 mmol/L (ref 135–145)
Total Bilirubin: 5.1 mg/dL (ref 0.3–1.2)
Total Protein: 7.9 g/dL (ref 6.5–8.1)

## 2020-06-26 LAB — RESPIRATORY PANEL BY RT PCR (FLU A&B, COVID)
Influenza A by PCR: NEGATIVE
Influenza B by PCR: NEGATIVE
SARS Coronavirus 2 by RT PCR: NEGATIVE

## 2020-06-26 LAB — LIPASE, BLOOD: Lipase: 56 U/L — ABNORMAL HIGH (ref 11–51)

## 2020-06-26 LAB — COMPREHENSIVE METABOLIC PANEL
ALT: 586 U/L — ABNORMAL HIGH (ref 0–44)
AST: 1461 U/L — ABNORMAL HIGH (ref 15–41)
Albumin: 3.1 g/dL — ABNORMAL LOW (ref 3.5–5.0)
Alkaline Phosphatase: 646 U/L — ABNORMAL HIGH (ref 38–126)
Anion gap: 24 — ABNORMAL HIGH (ref 5–15)
BUN: 38 mg/dL — ABNORMAL HIGH (ref 6–20)
CO2: 17 mmol/L — ABNORMAL LOW (ref 22–32)
Calcium: 9.5 mg/dL (ref 8.9–10.3)
Chloride: 97 mmol/L — ABNORMAL LOW (ref 98–111)
Creatinine, Ser: 1.42 mg/dL — ABNORMAL HIGH (ref 0.44–1.00)
GFR, Estimated: 47 mL/min — ABNORMAL LOW (ref >60)
Glucose, Bld: 168 mg/dL — ABNORMAL HIGH (ref 70–99)
Potassium: 5.2 mmol/L — ABNORMAL HIGH (ref 3.5–5.1)
Sodium: 138 mmol/L (ref 135–145)
Total Bilirubin: 5.5 mg/dL — ABNORMAL HIGH (ref 0.3–1.2)
Total Protein: 7.5 g/dL (ref 6.5–8.1)

## 2020-06-26 LAB — I-STAT BETA HCG BLOOD, ED (MC, WL, AP ONLY): I-stat hCG, quantitative: 8.5 m[IU]/mL — ABNORMAL HIGH (ref <5)

## 2020-06-26 LAB — LACTATE DEHYDROGENASE: LDH: 1454 U/L — ABNORMAL HIGH (ref 98–192)

## 2020-06-26 LAB — SAMPLE TO BLOOD BANK

## 2020-06-26 LAB — MAGNESIUM: Magnesium: 2.2 mg/dL (ref 1.7–2.4)

## 2020-06-26 LAB — LACTIC ACID, PLASMA
Lactic Acid, Venous: 10.3 mmol/L (ref 0.5–1.9)
Lactic Acid, Venous: 6.1 mmol/L (ref 0.5–1.9)

## 2020-06-26 LAB — TSH: TSH: 2.569 u[IU]/mL (ref 0.308–3.960)

## 2020-06-26 LAB — HCG, SERUM, QUALITATIVE: Preg, Serum: NEGATIVE

## 2020-06-26 LAB — T4, FREE: Free T4: 1.45 ng/dL — ABNORMAL HIGH (ref 0.61–1.12)

## 2020-06-26 LAB — MRSA PCR SCREENING: MRSA by PCR: NEGATIVE

## 2020-06-26 IMAGING — CT CT ABD-PELV W/O CM
2 of 4 series · 14 of 46 positions shown, 16 images · non-contrast
Comparison: Contrast-enhanced exam [DATE].

CLINICAL DATA: Diffuse abdominal pain and vomiting. History of
cancer and renal transplant. Liver mass post recent liver biopsy.

EXAM:
CT ABDOMEN AND PELVIS WITHOUT CONTRAST
TECHNIQUE: Multidetector CT imaging of the abdomen and pelvis was performed
following the standard protocol without IV contrast.

[Series 2: axial st · axial · 0.77mm/px · z∈[-475,-60]mm · 11 of 95 slices shown, 13 images]
[im 6/95  soft-tissue]
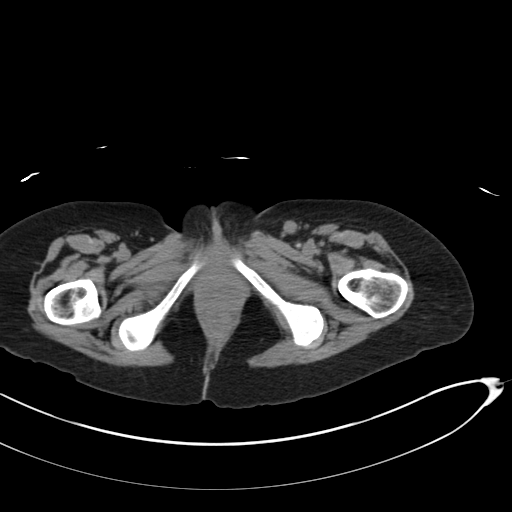
[im 6/95  bone]
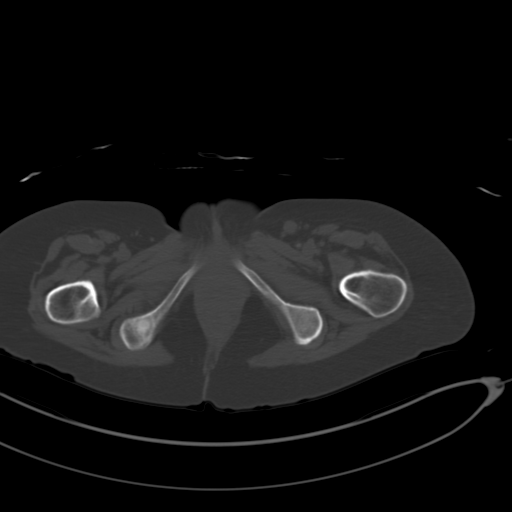
[im 16/95  soft-tissue]
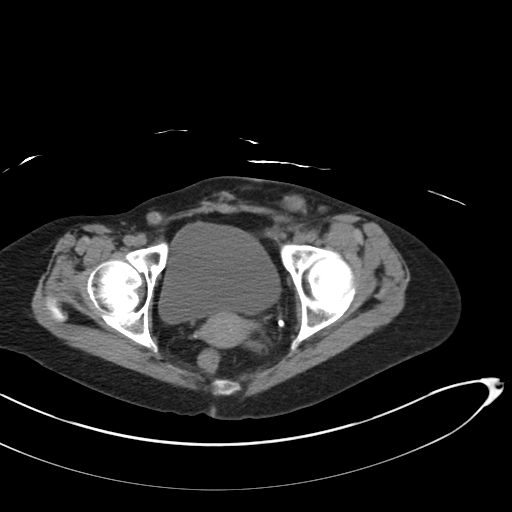
[im 21/95  soft-tissue]
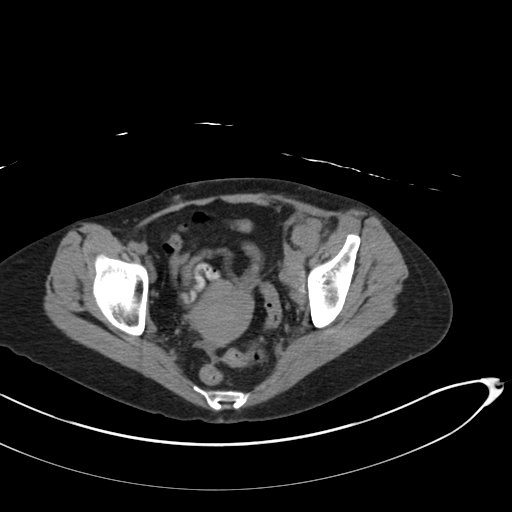
[im 32/95  soft-tissue]
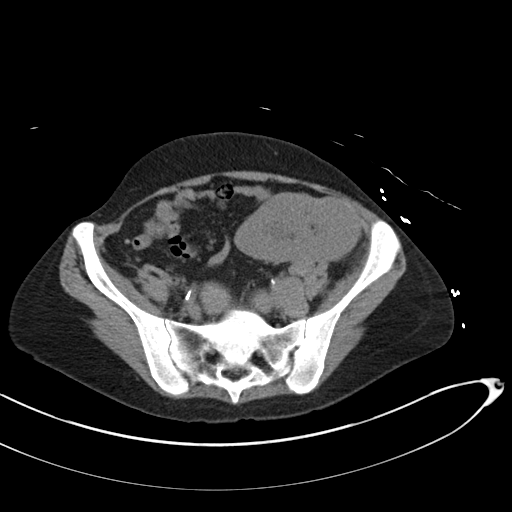
[im 37/95  soft-tissue]
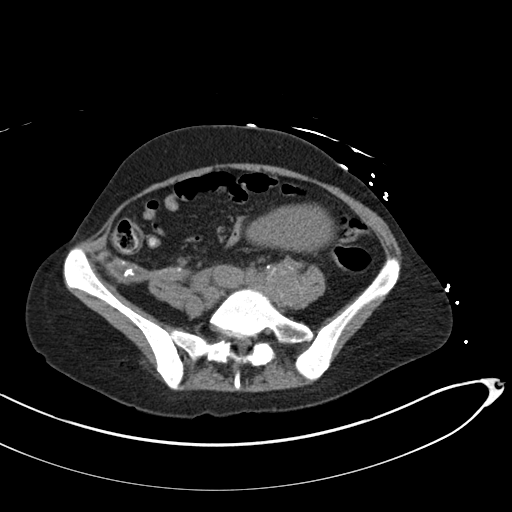
[im 48/95  soft-tissue]
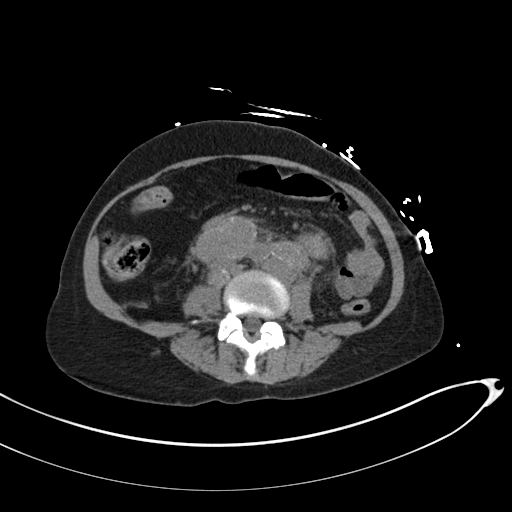
[im 58/95  soft-tissue]
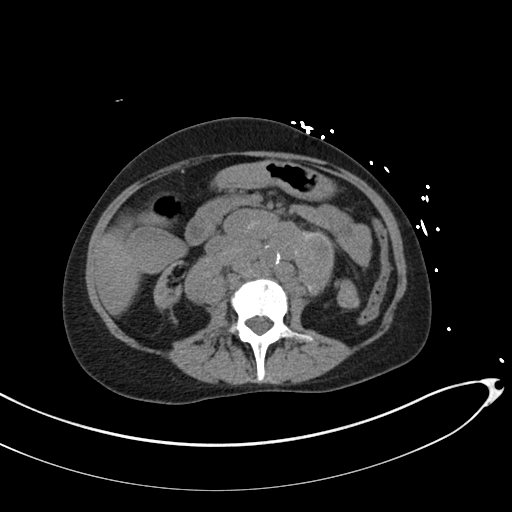
[im 63/95  soft-tissue]
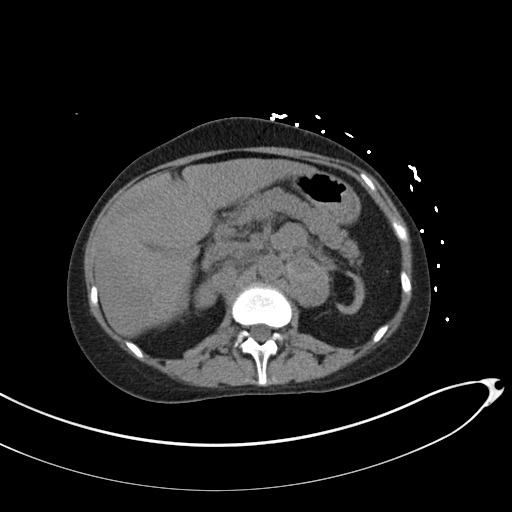
[im 74/95  soft-tissue]
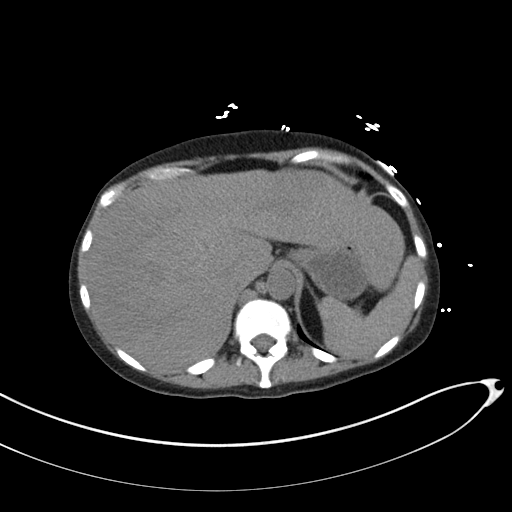
[im 74/95  bone]
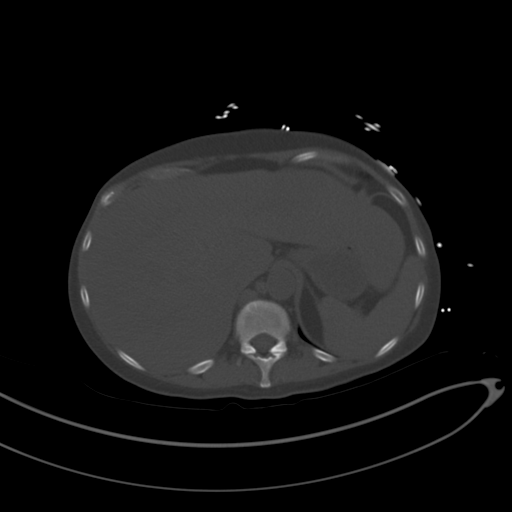
[im 79/95  soft-tissue]
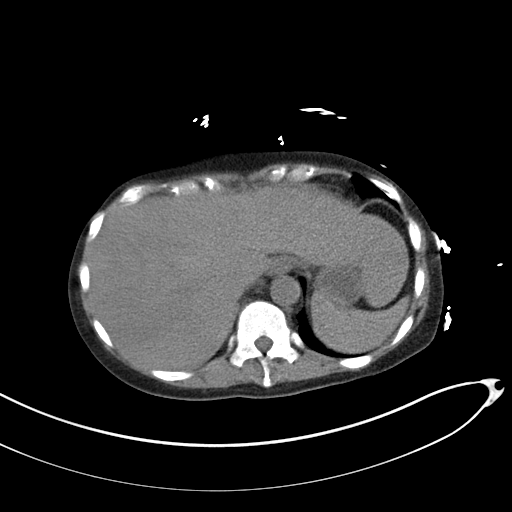
[im 89/95  soft-tissue]
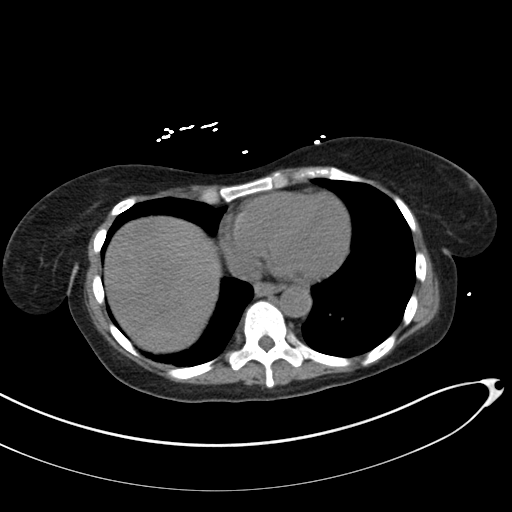

[Series 5: coronal st · coronal · 0.72mm/px · 3 of 128 slices shown]
[im 43/128  soft-tissue]
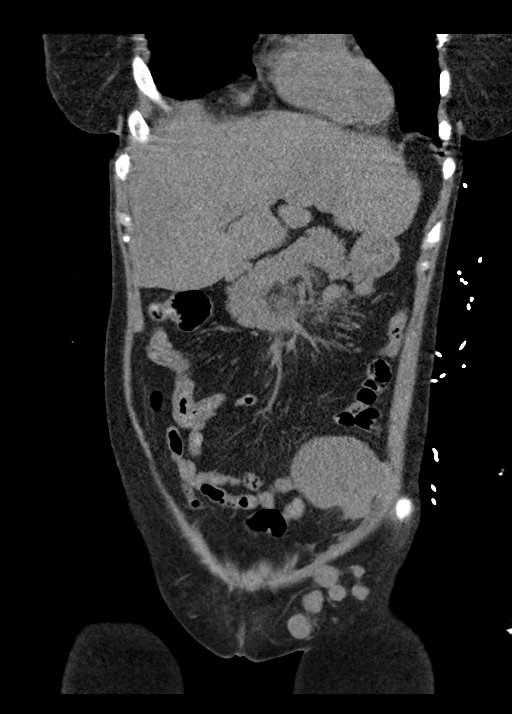
[im 57/128  soft-tissue]
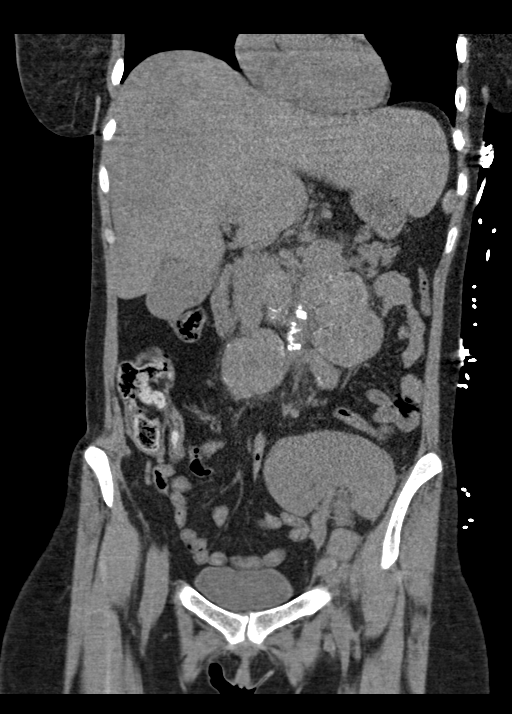
[im 71/128  soft-tissue]
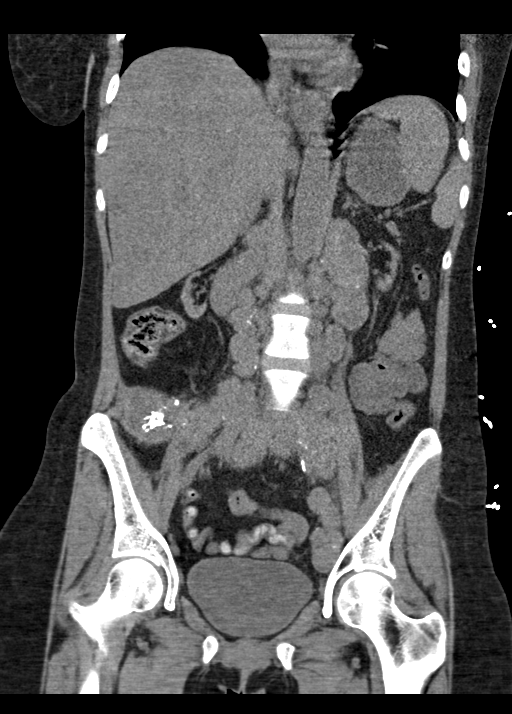

[14 of 46 positions shown; findings below may reference images not displayed]

FINDINGS: Lower chest: No focal airspace disease, pleural fluid, or pulmonary
nodularity. Heart is normal in size.

Hepatobiliary: Patient's known hepatic lesions are not well
demonstrated on noncontrast exam. Liver parenchyma is heterogeneous.
There may be a small subcapsular hematoma inferiorly from recent
liver biopsy, not well-defined on this noncontrast exam. The
gallbladder is present. There is high-density material in the
gallbladder lumen, possibly sludge. No calcified gallstone. Common
bile duct is poorly defined, but no evidence of biliary dilatation.

Pancreas: No ductal dilatation or inflammation.

Spleen: Normal in size without focal abnormality.

Adrenals/Urinary Tract: Normal adrenal glands. Chronic bilateral
native renal atrophy. Solid-appearing lesion in the lower left renal
hilum was better appreciated on prior contrast-enhanced CT, grossly
unchanged, series 2, image 35. Transplant kidney in the left lower
quadrant. Scarring in the left lower pole is better appreciated on
prior contrast-enhanced exam. Similar fullness of the renal
collecting system without frank hydronephrosis. No transplant
perinephric edema. Sequela of failed transplant in the right iliac
fossa. The urinary bladder is unremarkable.

Stomach/Bowel: Bowel evaluation is limited in the absence of enteric
contrast. There is no bowel obstruction or evidence of bowel
inflammation. The appendix is normal.

Vascular/Lymphatic: Bulky retroperitoneal adenopathy, some of which
is low-density and typical of necrosis. Occasional areas of nodal
calcification. Adenopathy extends from the retrocrural space to the
bilateral common iliac arteries, and left external iliac station.
There also enlarged bilateral inguinal lymph nodes, left greater
than right. Index aortocaval node measures 4.1 x 2.4 cm, previously
4.2 x 2.6 cm. Overall adenopathy is not significantly changed in the
interim allowing for noncontrast technique. There is aortic and
branch atherosclerosis.

Reproductive: Bulky uterus with fibroids.  No obvious adnexal mass.

Other: There is trace free fluid in the pelvis, slightly increased
from prior exam. No other free fluid or ascites. No free air.

Musculoskeletal: Ill-defined sclerosis involving right L2 vertebral
body, nonspecific but unchanged from previous. Sclerotic density in
the left acetabulum. There is hemi transitional lumbosacral anatomy.
No fracture or acute osseous abnormality.
IMPRESSION: 1. Patient's known hepatic lesions are not well-defined on this
noncontrast exam. There may be a small subcapsular hematoma
inferiorly from recent liver biopsy, not well-defined. No evidence
of hemoperitoneum.
2. Bulky retroperitoneal and bilateral inguinal adenopathy, grossly
unchanged from prior.
3. Transplant kidney in the left lower quadrant with unchanged
fullness of the renal collecting system. Sequela of failed
transplant in the right iliac fossa. Stable soft tissue density in
the region of the native left kidney lower hilum.
4. Bulky uterus with fibroids.
5. Nonspecific sclerosis involving the right aspect of L2 vertebral
body.

Aortic Atherosclerosis ([9B]-[9B]).

## 2020-06-26 IMAGING — DX DG CHEST 1V PORT
1 series · 1 of 1 positions shown · non-contrast
Comparison: [DATE]

CLINICAL DATA: Weakness

EXAM:
PORTABLE CHEST 1 VIEW

[chest ap]
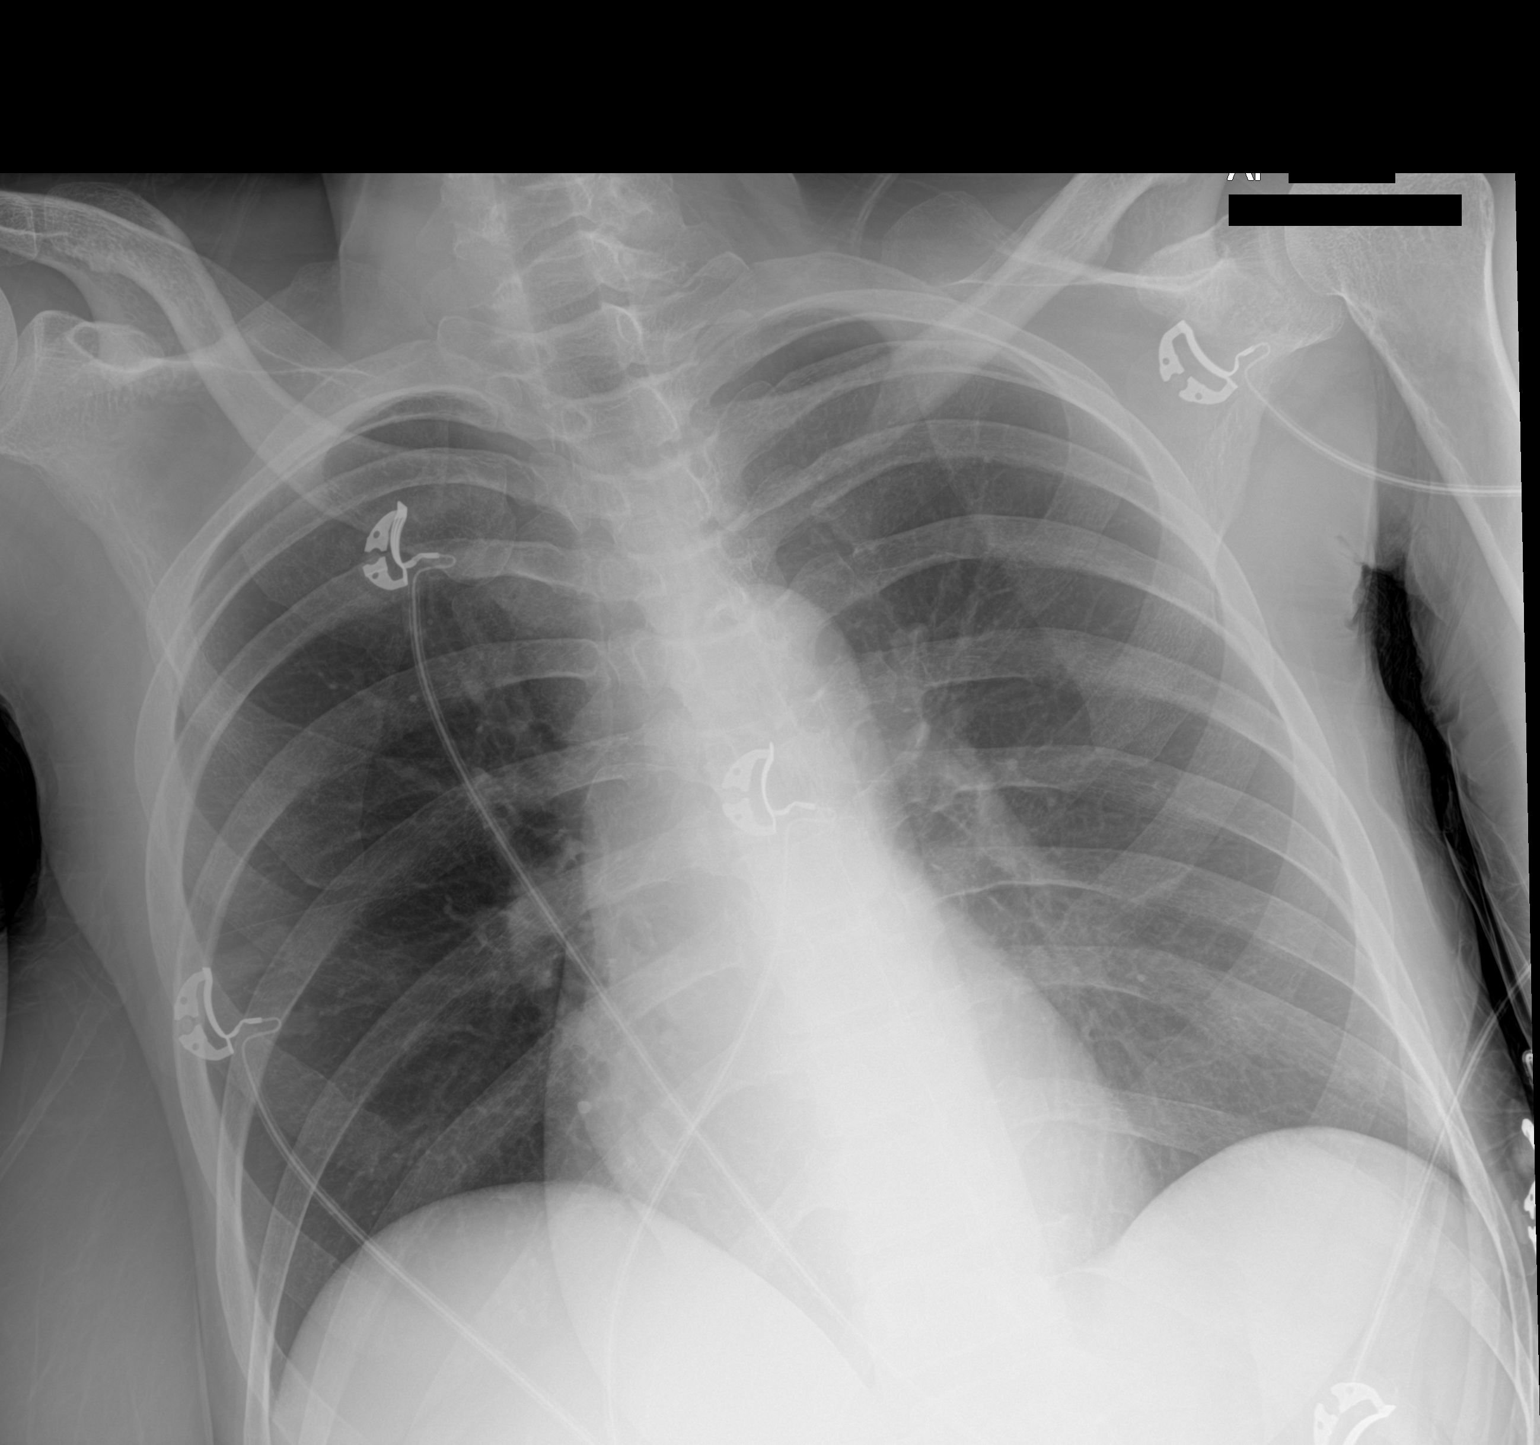

[1 of 1 positions shown; findings below may reference images not displayed]

FINDINGS: The heart size and mediastinal contours are within normal limits.
Both lungs are clear. No pleural effusion or pneumothorax. The
visualized skeletal structures are unremarkable.
IMPRESSION: No acute process in the chest.

## 2020-06-26 MED ORDER — OXYCODONE HCL 5 MG PO TABS
10.0000 mg | ORAL_TABLET | ORAL | Status: DC | PRN
  Administered 2020-06-26 – 2020-06-28 (×5): 10 mg via ORAL
  Filled 2020-06-26 (×5): qty 2

## 2020-06-26 MED ORDER — ORAL CARE MOUTH RINSE
15.0000 mL | Freq: Two times a day (BID) | OROMUCOSAL | Status: DC
  Administered 2020-06-26 – 2020-07-16 (×27): 15 mL via OROMUCOSAL

## 2020-06-26 MED ORDER — ACETAMINOPHEN 650 MG RE SUPP: 650.0000 mg | Freq: Four times a day (QID) | RECTAL | Status: DC | PRN

## 2020-06-26 MED ORDER — SODIUM CHLORIDE 0.9 % IV SOLN: INTRAVENOUS | Status: DC

## 2020-06-26 MED ORDER — SODIUM CHLORIDE 0.9 % IV BOLUS
1000.0000 mL | Freq: Once | INTRAVENOUS | Status: AC
  Administered 2020-06-26: 1000 mL via INTRAVENOUS

## 2020-06-26 MED ORDER — VANCOMYCIN HCL IN DEXTROSE 1-5 GM/200ML-% IV SOLN
1000.0000 mg | Freq: Once | INTRAVENOUS | Status: AC
  Administered 2020-06-26: 1000 mg via INTRAVENOUS
  Filled 2020-06-26: qty 200

## 2020-06-26 MED ORDER — SODIUM CHLORIDE 0.9 % IV BOLUS (SEPSIS)
1000.0000 mL | Freq: Once | INTRAVENOUS | Status: AC
  Administered 2020-06-26: 1000 mL via INTRAVENOUS

## 2020-06-26 MED ORDER — PRAVASTATIN SODIUM 20 MG PO TABS
20.0000 mg | ORAL_TABLET | Freq: Every day | ORAL | Status: DC
  Administered 2020-06-26: 20 mg via ORAL
  Filled 2020-06-26: qty 1

## 2020-06-26 MED ORDER — VANCOMYCIN HCL 500 MG/100ML IV SOLN
500.0000 mg | Freq: Two times a day (BID) | INTRAVENOUS | Status: DC
  Administered 2020-06-27: 500 mg via INTRAVENOUS
  Filled 2020-06-26: qty 100

## 2020-06-26 MED ORDER — PANTOPRAZOLE SODIUM 40 MG IV SOLR
40.0000 mg | Freq: Two times a day (BID) | INTRAVENOUS | Status: DC
  Administered 2020-06-26 – 2020-07-02 (×13): 40 mg via INTRAVENOUS
  Filled 2020-06-26 (×13): qty 40

## 2020-06-26 MED ORDER — SODIUM CHLORIDE 0.9 % IV SOLN
2.0000 g | Freq: Two times a day (BID) | INTRAVENOUS | Status: DC
  Administered 2020-06-27: 2 g via INTRAVENOUS
  Filled 2020-06-26: qty 2

## 2020-06-26 MED ORDER — ONDANSETRON HCL 4 MG PO TABS: 4.0000 mg | ORAL_TABLET | Freq: Four times a day (QID) | ORAL | Status: DC | PRN

## 2020-06-26 MED ORDER — HYDROMORPHONE HCL 1 MG/ML IJ SOLN
INTRAMUSCULAR | Status: AC
  Filled 2020-06-26: qty 1

## 2020-06-26 MED ORDER — LACTATED RINGERS IV BOLUS: 1000.0000 mL | Freq: Once | INTRAVENOUS | Status: DC

## 2020-06-26 MED ORDER — TACROLIMUS ER 1 MG PO TB24: 4.0000 mg | ORAL_TABLET | Freq: Every day | ORAL | Status: DC

## 2020-06-26 MED ORDER — CHLORHEXIDINE GLUCONATE CLOTH 2 % EX PADS
6.0000 | MEDICATED_PAD | Freq: Every day | CUTANEOUS | Status: DC
  Administered 2020-06-26 – 2020-06-28 (×2): 6 via TOPICAL

## 2020-06-26 MED ORDER — ONDANSETRON HCL 4 MG/2ML IJ SOLN
4.0000 mg | Freq: Once | INTRAMUSCULAR | Status: AC
  Administered 2020-06-26: 4 mg via INTRAVENOUS
  Filled 2020-06-26: qty 2

## 2020-06-26 MED ORDER — PROMETHAZINE HCL 25 MG/ML IJ SOLN
25.0000 mg | Freq: Once | INTRAMUSCULAR | Status: AC
  Administered 2020-06-26: 25 mg via INTRAMUSCULAR

## 2020-06-26 MED ORDER — ACETAMINOPHEN 325 MG PO TABS
650.0000 mg | ORAL_TABLET | Freq: Four times a day (QID) | ORAL | Status: DC | PRN
  Administered 2020-07-13: 650 mg via ORAL
  Filled 2020-06-26: qty 2

## 2020-06-26 MED ORDER — ENOXAPARIN SODIUM 40 MG/0.4ML ~~LOC~~ SOLN
40.0000 mg | Freq: Every day | SUBCUTANEOUS | Status: DC
  Administered 2020-06-26 – 2020-07-15 (×20): 40 mg via SUBCUTANEOUS
  Filled 2020-06-26 (×20): qty 0.4

## 2020-06-26 MED ORDER — MORPHINE SULFATE (PF) 2 MG/ML IV SOLN
1.0000 mg | INTRAVENOUS | Status: DC | PRN
  Administered 2020-06-26 – 2020-06-27 (×2): 1 mg via INTRAVENOUS
  Filled 2020-06-26 (×3): qty 1

## 2020-06-26 MED ORDER — SODIUM CHLORIDE 0.9 % IV SOLN
2.0000 g | Freq: Once | INTRAVENOUS | Status: AC
  Administered 2020-06-26: 2 g via INTRAVENOUS
  Filled 2020-06-26: qty 2

## 2020-06-26 MED ORDER — ONDANSETRON HCL 4 MG/2ML IJ SOLN
4.0000 mg | Freq: Four times a day (QID) | INTRAMUSCULAR | Status: DC | PRN
  Administered 2020-06-26 – 2020-06-29 (×8): 4 mg via INTRAVENOUS
  Filled 2020-06-26 (×8): qty 2

## 2020-06-26 MED ORDER — METRONIDAZOLE IN NACL 5-0.79 MG/ML-% IV SOLN
500.0000 mg | Freq: Once | INTRAVENOUS | Status: AC
  Administered 2020-06-26: 500 mg via INTRAVENOUS
  Filled 2020-06-26: qty 100

## 2020-06-26 MED ORDER — MYCOPHENOLATE MOFETIL 250 MG PO CAPS
500.0000 mg | ORAL_CAPSULE | Freq: Two times a day (BID) | ORAL | Status: DC
  Administered 2020-06-26 – 2020-07-12 (×30): 500 mg via ORAL
  Filled 2020-06-26 (×36): qty 2

## 2020-06-26 MED ORDER — HYDROMORPHONE HCL 1 MG/ML IJ SOLN
1.0000 mg | Freq: Once | INTRAMUSCULAR | Status: AC
  Administered 2020-06-26: 1 mg via INTRAVENOUS
  Filled 2020-06-26: qty 1

## 2020-06-26 MED ORDER — PROMETHAZINE HCL 25 MG/ML IJ SOLN
INTRAMUSCULAR | Status: AC
  Filled 2020-06-26: qty 1

## 2020-06-26 MED ORDER — HYDROCORTISONE NA SUCCINATE PF 100 MG IJ SOLR
100.0000 mg | Freq: Three times a day (TID) | INTRAMUSCULAR | Status: DC
  Administered 2020-06-26 – 2020-06-27 (×2): 100 mg via INTRAVENOUS
  Filled 2020-06-26 (×2): qty 2

## 2020-06-26 MED ORDER — SODIUM CHLORIDE (PF) 0.9 % IJ SOLN
INTRAMUSCULAR | Status: AC
  Filled 2020-06-26: qty 50

## 2020-06-26 MED ORDER — LORAZEPAM 0.5 MG PO TABS
0.5000 mg | ORAL_TABLET | Freq: Three times a day (TID) | ORAL | Status: DC | PRN
  Administered 2020-07-12 – 2020-07-15 (×2): 0.5 mg via ORAL
  Filled 2020-06-26 (×2): qty 1

## 2020-06-26 MED ORDER — PROCHLORPERAZINE MALEATE 10 MG PO TABS
10.0000 mg | ORAL_TABLET | Freq: Four times a day (QID) | ORAL | Status: DC | PRN
  Administered 2020-06-30: 10 mg via ORAL
  Filled 2020-06-26 (×2): qty 1

## 2020-06-26 MED ORDER — HYDROMORPHONE HCL 1 MG/ML IJ SOLN
1.0000 mg | Freq: Once | INTRAMUSCULAR | Status: AC
  Administered 2020-06-26: 1 mg via INTRAMUSCULAR

## 2020-06-26 MED ORDER — DIPHENHYDRAMINE HCL 50 MG/ML IJ SOLN
12.5000 mg | Freq: Once | INTRAMUSCULAR | Status: AC
  Administered 2020-06-26: 12.5 mg via INTRAVENOUS
  Filled 2020-06-26: qty 1

## 2020-06-26 NOTE — Progress Notes (Signed)
Patient in lab, slumped in wheelchair. Moaning. C/O nausea past 2 days - states anti nausea meds not working - taking q 8 hrs. Diffuse mid abdominal pain worsening over night. Took last pain med 2 days ago - states did not take more due to "it didn't work". Evaluated by Dr. Irene Limbo, received instructions to take to [facility] ED. Laurena Slimmer, ED CN for room assignment. Phenergan 25 mg Im/Dilaudid 1 mg IM given per Dr. Grier Mitts orders while in [place] exam room.  Patient's son in exam room with her. Patient directed son to go home with his brother (witing in car). Assisted to stretcher and transported to ED via stretcher. Assisted to transfer to ED bed in Room 7. Patient sleepy, oriented x3. Vomited x3 after moving to ED stretcher. Warm transfer of patient to ED staff.  Report given to Dr. Melina Copa and Denton Ar, RN at bedside.

## 2020-06-26 NOTE — ED Notes (Addendum)
Report to floor, given to [place].

## 2020-06-26 NOTE — Progress Notes (Signed)
Pharmacy Antibiotic Note  Candace Griffin is a 44 y.o. female with hx kidney transplant and recently diagnosed with metastatic carcinoma of unknown primary on chemotherapy PTA (last treatment on 10/21), presented to the ED from the [facility] on 06/26/2020 after she was found slumped in her wheelchair with c/o abdominal pain and n/v.  Pharmacy is consulted to start vancomycin and cefepime for sepsis.  - scr 1.42 (crcl~48)  Plan: - vancomycin 1000 mg given in the ED on 11/3 at 1641 and cefepime 2gm at 1436.   - vancomycin 500 mg IV q12h - cefepime 2gm IV q12h  ______________________________________________  Temp (24hrs), Avg:97.9 F (36.6 C), Min:97.4 F (36.3 C), Max:98.3 F (36.8 C)  Recent Labs  Lab 06/26/20 1031 06/26/20 1153 06/26/20 1433  WBC 22.2* 25.1*  --   CREATININE 1.38* 1.42*  --   LATICACIDVEN  --  10.3* 6.1*    Estimated Creatinine Clearance: 48.6 mL/min (A) (by C-G formula based on SCr of 1.42 mg/dL (H)).    Allergies  Allergen Reactions  . Lisinopril Cough  . Tape Rash and Other (See Comments)    Causes welts also     Thank you for allowing pharmacy to be a part of this patient's care.  Lynelle Doctor 06/26/2020 6:19 PM

## 2020-06-26 NOTE — ED Notes (Signed)
Pt c/o itching

## 2020-06-26 NOTE — H&P (Addendum)
History and Physical    Candace Griffin HKV:[id number] DOB: [birth year] DOA: 06/26/2020  PCP: Richarda Osmond, DO   Patient coming from: Oncology office   Chief Complaint: abdominal pain, nausea and vomiting.   HPI: Candace Griffin is a 44 y.o. female with medical history significant of renal failure due to chronic glomerulonephritis, status post kidney transplant, GERD, hypertension and recently diagnosed metastatic carcinoma of unknown primary.  For the last 4 days patient has been experiencing abdominal pain, dull in nature, moderate to severe in intensity, in the center of the abdomen, no improving or worsening factors, it has been associated with persistent nausea, vomiting and generalized weakness. She is not able to tolerate liquids or ice, she has been not able to tolerate her home medications including analgesics and immunosuppressants.  Patient was seen at Dr. Irene Limbo office today, she was noted to be acutely ill, she received Phenergan and hydromorphone and was transferred to the emergency room.  October 4 patient underwent left inguinal node needle core biopsy and liver needle core biopsy, both resulted in metastatic carcinoma.  Waiting for further results before starting therapy.  ED Course: Patient was found critically ill, hypotensive 86/68, tachycardic 152, respiratory 22.  Patient received antibiotic therapy with cefepime, vancomycin and metronidazole, 3 L of IV fluids, and referred for further hospitalization.  Review of Systems:  1. General: No fevers, no chills, no weight gain or weight loss 2. ENT: No runny nose or sore throat, no hearing disturbances 3. Pulmonary: No dyspnea, cough, wheezing, or hemoptysis 4. Cardiovascular: No angina, claudication, lower extremity edema, pnd or orthopnea 5. Gastrointestinal: positive nausea and vomiting as mentioned in HPI no diarrhea or constipation 6. Hematology: No easy bruisability or frequent infections 7. Urology: No  dysuria, hematuria or increased urinary frequency 8. Dermatology: No rashes. 9. Neurology: No seizures or paresthesias 10. Musculoskeletal: No joint pain or deformities  Past Medical History:  Diagnosis Date  . Chronic glomerulonephritis   . Dialysis patient ([place])   . End stage renal disease ([place])   . GERD (gastroesophageal reflux disease)   . History of renal transplant 2011-10-03  . Hypertension     Past Surgical History:  Procedure Laterality Date  . COLPOSCOPY  2016  . diaylsis shunt Right    arm  . KIDNEY TRANSPLANT  10-03-11   deceased donor kidney     reports that she has never smoked. She has never used smokeless tobacco. She reports that she does not drink alcohol and does not use drugs.  Allergies  Allergen Reactions  . Lisinopril Cough  . Other Rash    Causes welts also  . Tape Rash and Other (See Comments)    Causes welts also    Family History  Problem Relation Age of Onset  . Diabetes Father   . Diabetes Paternal Aunt   . Diabetes Paternal Grandmother   . Alcohol abuse Paternal Grandmother   . Alcohol abuse Paternal Uncle   . Cancer Maternal Grandmother        breast  . Breast cancer Maternal Grandmother   . Alcohol abuse Paternal Grandfather      Prior to Admission medications   Medication Sig Start Date End Date Taking? Authorizing Provider  cabozantinib (CABOMETYX) 40 MG tablet Take 1 tablet (40 mg total) by mouth daily. Take on an empty stomach, 1 hour before or 2 hours after meals. 06/21/20   Brunetta Genera, MD  cholecalciferol (VITAMIN D3) 25 MCG (1000 UNIT) tablet Take 1,000 Units  by mouth daily.    [provider]  dexamethasone (DECADRON) 4 MG tablet Take 2 tablets (8 mg total) by mouth daily. Start the day after carboplatin chemotherapy for 3 days. 06/10/20   Brunetta Genera, MD  fluconazole (DIFLUCAN) 100 MG tablet Take 1 tablet (100 mg total) by mouth daily. 06/02/20   Benay Pike, MD  lidocaine-prilocaine (EMLA)  cream Apply to affected area once 06/10/20   Brunetta Genera, MD  LORazepam (ATIVAN) 0.5 MG tablet Take 1 tablet (0.5 mg total) by mouth every 8 (eight) hours as needed for anxiety. 06/03/20   Maness, Arnette Norris, MD  LORazepam (ATIVAN) 0.5 MG tablet Take 1 tablet (0.5 mg total) by mouth every 6 (six) hours as needed (Nausea or vomiting). Patient not taking: Reported on 06/13/2020 06/10/20   Brunetta Genera, MD  mycophenolate (CELLCEPT) 250 MG capsule Take 500 mg by mouth 2 (two) times daily.  07/19/18   [provider]  omeprazole (PRILOSEC) 20 MG capsule Take 20 mg by mouth 2 (two) times daily as needed (for heart burn).     [provider]  ondansetron (ZOFRAN) 8 MG tablet Take 1 tablet (8 mg total) by mouth every 8 (eight) hours as needed for nausea or vomiting. 06/03/20   Maness, Arnette Norris, MD  ondansetron (ZOFRAN) 8 MG tablet Take 1 tablet (8 mg total) by mouth 2 (two) times daily as needed for refractory nausea / vomiting. Start on day 3 after carboplatin chemo. 06/10/20   Brunetta Genera, MD  oxyCODONE (ROXICODONE) 5 MG immediate release tablet Take 2 tablets (10 mg total) by mouth every 4 (four) hours as needed for moderate pain or severe pain. 06/03/20   Maness, Arnette Norris, MD  pravastatin (PRAVACHOL) 20 MG tablet Take 20 mg by mouth at bedtime.  Patient not taking: Reported on 06/13/2020 05/29/19   [provider]  predniSONE (DELTASONE) 5 MG tablet Take 5 mg by mouth daily with breakfast.  07/25/19   [provider]  prochlorperazine (COMPAZINE) 10 MG tablet Take 1 tablet (10 mg total) by mouth every 6 (six) hours as needed (Nausea or vomiting). 06/10/20   Brunetta Genera, MD  senna (SENOKOT) 8.6 MG TABS tablet Take 1 tablet (8.6 mg total) by mouth daily as needed for moderate constipation. 02/19/20   Anderson, Chelsey L, DO  Tacrolimus ER (ENVARSUS XR) 1 MG TB24 Take 4 mg by mouth daily.  12/01/19   [provider]    Physical  Exam: Vitals:   06/26/20 1515 06/26/20 1530 06/26/20 1545 06/26/20 1645  BP: 107/74 112/86 (!) 109/98 (!) 112/98  Pulse:      Resp: 16 20 17 20   Temp:      TempSrc:      SpO2:        Vitals:   06/26/20 1515 06/26/20 1530 06/26/20 1545 06/26/20 1645  BP: 107/74 112/86 (!) 109/98 (!) 112/98  Pulse:      Resp: 16 20 17 20   Temp:      TempSrc:      SpO2:       General: deconditioned and ill looking appearing  Neurology: Awake and alert, non focal Head and Neck. Head normocephalic. Neck supple with no adenopathy or thyromegaly.   E ENT: no pallor, no icterus, oral mucosa dry Cardiovascular: No JVD. S1-S2 present, rhythmic, no gallops, rubs, or murmurs. No lower extremity edema. Pulmonary: positive breath sounds bilaterally, adequate air movement, no wheezing, rhonchi or rales. Gastrointestinal. Abdomen mild distended, tender to  palpation athe mid abdomen with no rebound or guarding, liver palpable at the right subcostal region. Skin. No rashes Musculoskeletal: no joint deformities    Labs on Admission: I have personally reviewed following labs and imaging studies  CBC: Recent Labs  Lab 06/26/20 1031 06/26/20 1153  WBC 22.2* 25.1*  NEUTROABS 18.7* 21.5*  HGB 12.1 11.9*  HCT 38.7 39.4  MCV 79.6* 85.8  PLT 205 161   Basic Metabolic Panel: Recent Labs  Lab 06/26/20 1031 06/26/20 1153  NA 140 138  K 4.7 5.2*  CL 98 97*  CO2 20* 17*  GLUCOSE 175* 168*  BUN 35* 38*  CREATININE 1.38* 1.42*  CALCIUM 10.2 9.5  MG 2.2  --    GFR: Estimated Creatinine Clearance: 48.6 mL/min (A) (by C-G formula based on SCr of 1.42 mg/dL (H)). Liver Function Tests: Recent Labs  Lab 06/26/20 1031 06/26/20 1153  AST 1,687* 1,461*  ALT 665* 586*  ALKPHOS 767* 646*  BILITOT 5.1* 5.5*  PROT 7.9 7.5  ALBUMIN 2.9* 3.1*   Recent Labs  Lab 06/26/20 1153  LIPASE 56*   No results for input(s): AMMONIA in the last 168 hours. Coagulation Profile: No results for input(s): INR,  PROTIME in the last 168 hours. Cardiac Enzymes: No results for input(s): CKTOTAL, CKMB, CKMBINDEX, TROPONINI in the last 168 hours. BNP (last 3 results) No results for input(s): PROBNP in the last 8760 hours. HbA1C: No results for input(s): HGBA1C in the last 72 hours. CBG: No results for input(s): GLUCAP in the last 168 hours. Lipid Profile: No results for input(s): CHOL, HDL, LDLCALC, TRIG, CHOLHDL, LDLDIRECT in the last 72 hours. Thyroid Function Tests: Recent Labs    06/26/20 1032  TSH 2.569  FREET4 1.45*   Anemia Panel: No results for input(s): VITAMINB12, FOLATE, FERRITIN, TIBC, IRON, RETICCTPCT in the last 72 hours. Urine analysis:    Component Value Date/Time   COLORURINE YELLOW 05/27/2020 0636   APPEARANCEUR CLEAR 05/27/2020 0636   LABSPEC 1.020 05/27/2020 0636   PHURINE 5.0 05/27/2020 0636   GLUCOSEU NEGATIVE 05/27/2020 0636   HGBUR NEGATIVE 05/27/2020 0636   HGBUR moderate 02/03/2010 1321   BILIRUBINUR NEGATIVE 05/27/2020 0636   BILIRUBINUR negative 11/14/2019 1505   BILIRUBINUR NEG 09/26/2015 1406   KETONESUR 20 (A) 05/27/2020 0636   PROTEINUR NEGATIVE 05/27/2020 0636   UROBILINOGEN 0.2 11/14/2019 1505   UROBILINOGEN 0.2 02/03/2015 1700   NITRITE NEGATIVE 05/27/2020 0636   LEUKOCYTESUR NEGATIVE 05/27/2020 0636    Radiological Exams on Admission: CT ABDOMEN PELVIS WO CONTRAST  Result Date: 06/26/2020 CLINICAL DATA:  Diffuse abdominal pain and vomiting. History of cancer and renal transplant. Liver mass post recent liver biopsy. EXAM: CT ABDOMEN AND PELVIS WITHOUT CONTRAST TECHNIQUE: Multidetector CT imaging of the abdomen and pelvis was performed following the standard protocol without IV contrast. COMPARISON:  Contrast-enhanced exam 05/27/2020. FINDINGS: Lower chest: No focal airspace disease, pleural fluid, or pulmonary nodularity. Heart is normal in size. Hepatobiliary: Patient's known hepatic lesions are not well demonstrated on noncontrast exam. Liver  parenchyma is heterogeneous. There may be a small subcapsular hematoma inferiorly from recent liver biopsy, not well-defined on this noncontrast exam. The gallbladder is present. There is high-density material in the gallbladder lumen, possibly sludge. No calcified gallstone. Common bile duct is poorly defined, but no evidence of biliary dilatation. Pancreas: No ductal dilatation or inflammation. Spleen: Normal in size without focal abnormality. Adrenals/Urinary Tract: Normal adrenal glands. Chronic bilateral native renal atrophy. Solid-appearing lesion in the lower left renal hilum  was better appreciated on prior contrast-enhanced CT, grossly unchanged, series 2, image 35. Transplant kidney in the left lower quadrant. Scarring in the left lower pole is better appreciated on prior contrast-enhanced exam. Similar fullness of the renal collecting system without frank hydronephrosis. No transplant perinephric edema. Sequela of failed transplant in the right iliac fossa. The urinary bladder is unremarkable. Stomach/Bowel: Bowel evaluation is limited in the absence of enteric contrast. There is no bowel obstruction or evidence of bowel inflammation. The appendix is normal. Vascular/Lymphatic: Bulky retroperitoneal adenopathy, some of which is low-density and typical of necrosis. Occasional areas of nodal calcification. Adenopathy extends from the retrocrural space to the bilateral common iliac arteries, and left external iliac station. There also enlarged bilateral inguinal lymph nodes, left greater than right. Index aortocaval node measures 4.1 x 2.4 cm, previously 4.2 x 2.6 cm. Overall adenopathy is not significantly changed in the interim allowing for noncontrast technique. There is aortic and branch atherosclerosis. Reproductive: Bulky uterus with fibroids.  No obvious adnexal mass. Other: There is trace free fluid in the pelvis, slightly increased from prior exam. No other free fluid or ascites. No free air.  Musculoskeletal: Ill-defined sclerosis involving right L2 vertebral body, nonspecific but unchanged from previous. Sclerotic density in the left acetabulum. There is hemi transitional lumbosacral anatomy. No fracture or acute osseous abnormality. IMPRESSION: 1. Patient's known hepatic lesions are not well-defined on this noncontrast exam. There may be a small subcapsular hematoma inferiorly from recent liver biopsy, not well-defined. No evidence of hemoperitoneum. 2. Bulky retroperitoneal and bilateral inguinal adenopathy, grossly unchanged from prior. 3. Transplant kidney in the left lower quadrant with unchanged fullness of the renal collecting system. Sequela of failed transplant in the right iliac fossa. Stable soft tissue density in the region of the native left kidney lower hilum. 4. Bulky uterus with fibroids. 5. Nonspecific sclerosis involving the right aspect of L2 vertebral body. Aortic Atherosclerosis (ICD10-I70.0). Electronically Signed   By: Keith Rake M.D.   On: 06/26/2020 16:45   DG Chest Port 1 View  Result Date: 06/26/2020 CLINICAL DATA:  Weakness EXAM: PORTABLE CHEST 1 VIEW COMPARISON:  05/26/2020 FINDINGS: The heart size and mediastinal contours are within normal limits. Both lungs are clear. No pleural effusion or pneumothorax. The visualized skeletal structures are unremarkable. IMPRESSION: No acute process in the chest. Electronically Signed   By: Macy Mis M.D.   On: 06/26/2020 13:45    EKG: Independently reviewed.  153 bpm, normal axis, normal intervals, sinus rhythm, no ST segment or T wave changes.  Assessment/Plan Principal Problem:   Pancreatitis Active Problems:   GERD   Status post kidney transplant   Hypertension   Symptomatic anemia   Metastatic carcinoma to liver (HCC)   AKI (acute kidney injury) ([place])   44 year old female with a past medical history for renal failure status post kidney transplant, newly diagnosed metastatic carcinoma, unknown primary.   Presents with 4 days of intractable nausea, vomiting abdominal pain, not able to tolerate liquids or oral medications.  On her initial physical examination she was hypotensive, after fluid resuscitation her blood pressure is 108/87, heart rate 136, respiratory rate 18, oxygen saturation 100% on room air.  She had dry mucous membranes, her lungs are clear to auscultation bilaterally, heart S1-S2, present, tachycardic, abdomen mildly distended, tender in the mid abdomen, no rebound or guarding, no lower extremity edema, no rashes.  Sodium 138, potassium 5.2, chloride 97, bicarb 17, glucose 168, BUN 38, creatinine 1.42, anion gap 24, alkaline phosphatase  646, lipase 56, AST 1461, ALT 586, lactic acid 10.3, white count 25.1, hemoglobin 11.9, hematocrit 39.4, platelets 205. CT of the abdomen with hepatic lesions.  Small subcapsular hematoma inferiorly from recent liver biopsy.  Bulky retroperitoneal and bilateral inguinal adenopathy.  Transplant kidney in the left lower quadrant with unchanged fullness of the renal collecting system. Pancreas with no ductal dilatation or inflammation. Chest radiograph with no infiltrates.  Patient will be admitted to the hospital with a working diagnosis of systemic inflammatory response syndrome, possible pancreatitis, rule out sepsis.  1.  Severe systemic inflammatory response syndrome, suspect pancreatitis, cannot rule out sepsis. Will admit patient to the progressive care unit, continue volume resuscitation with isotonic saline at 100 mL/h. Continue supportive medical therapy with intravenous antiacids, as needed antiemetics and analgesics. Place patient on a clear liquid diet and follow-up lipase in the morning.  Patient had a recent liver biopsy, currently cannot rule out acute infection, continue broad-spectrum antibiotic therapy with vancomycin and cefepime.  2.  Acute kidney injury likely prerenal/anion gap metabolic acidosis/lactic acidosis..  Continue  aggressive volume resuscitation with isotonic saline, if recurrent hypotension patient may need vasopressors.  Close follow-up of urine output and kidney function.  Avoid nephrotoxic agents and hypotension.  3.  Elevated liver enzymes/metastatic carcinoma of unknown primary.  Worsening elevation of liver enzymes, including alkaline phosphatase and total bilirubin's.  Further work-up with liver ultrasonography.  Continue supportive medical care, avoid hypotension.  She has no signs of encephalopathy.  Hold on cabozantinib for now. Follow with oncology recommendations.   4.  Status post kidney transplant.  Patient taking chronically 5 mg of prednisone, will transition to stress dose steroids for now in setting of severe hypotension. Continue mycophenolate and tacrolimus.  5. Anxiety. Continue with lorazepam per home regimen.   Status is: Inpatient  Patient critically ill with high risk for imminent deterioration, critical care time 60 minutes,.  Remains inpatient appropriate because:IV treatments appropriate due to intensity of illness or inability to take PO   Dispo: The patient is from: Home              Anticipated d/c is to: Home              Anticipated d/c date is: 3 days              Patient currently is not medically stable to d/c.   DVT prophylaxis: Enoxaparin   Code Status:   full  Family Communication:  No family at the bedside     Consults called:  Oncology   Admission status:  Inpatient    Hoke Baer Gerome Apley MD [facility]   06/26/2020, 4:58 PM

## 2020-06-26 NOTE — ED Notes (Signed)
Date and time results received: 06/26/20 1412   Test: Lactic  Critical Value: 10.3  Name of Provider Notified:[place]  Orders Received? Or Actions Taken?:

## 2020-06-26 NOTE — ED Provider Notes (Signed)
[facility] DEPT Provider Note   CSN: [id number] Arrival date & time: 06/26/20  1140     History Chief Complaint  Patient presents with  . Vomiting  . Abdominal Pain    Candace Griffin is a 44 y.o. female.  She has a history of end-stage renal disease and was on dialysis.  Since then she has had a renal transplant and is not getting dialysis.  She is on immunosuppression for this.  Unfortunately her native kidney ended up having renal cell carcinoma and had metastatic disease to her liver.  She is followed with Dr. Irene Limbo.  For the past 3 to 4 days has had increased abdominal pain nausea and vomiting nonbloody nonbilious.  No fevers or chills chest pain shortness of breath diarrhea constipation or urinary symptoms.  Her oral nausea medicine has not been effective.  She went to her oncology appointment today and they brought her down to the emergency department for evaluation.  The history is provided by the patient.  Abdominal Pain Pain location:  Epigastric Pain radiates to:  Does not radiate Pain severity:  Severe Onset quality:  Gradual Timing:  Constant Progression:  Worsening Chronicity:  Chronic Context: not trauma   Relieved by:  Nothing Worsened by:  Nothing Ineffective treatments:  None tried Associated symptoms: nausea and vomiting   Associated symptoms: no chest pain, no constipation, no cough, no diarrhea, no dysuria, no fever, no hematemesis, no hematochezia, no hematuria, no shortness of breath and no sore throat        Past Medical History:  Diagnosis Date  . Chronic glomerulonephritis   . Dialysis patient ([place])   . End stage renal disease ([place])   . GERD (gastroesophageal reflux disease)   . History of renal transplant 2011/10/21  . Hypertension     Patient Active Problem List   Diagnosis Date Noted  . 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 with unknown primary site ([place]) 06/10/2020  . Counseling regarding  advance care planning and goals of care 06/10/2020  . Metastatic carcinoma to liver ([place])   . Renal mass   . Lymphadenopathy   . Anemia   . Feeling unwell 03/28/2020  . Constipation 02/21/2020  . Left arm pain 11/16/2019  . Symptomatic anemia 12/03/2017  . Low grade squamous intraepithelial lesion (LGSIL) on cervical Pap smear 05/03/2014  . Genital warts 07/31/2013  . Hypertension 04/21/2012  . Status post kidney transplant 01/07/2012  . Menorrhagia Oct 21, 2011  . GERD 10/10/2009  . GENITAL HERPES, HX OF 08/11/2007    Past Surgical History:  Procedure Laterality Date  . COLPOSCOPY  2016  . diaylsis shunt Right    arm  . KIDNEY TRANSPLANT  Oct 21, 2011   deceased donor kidney     OB History    Gravida  4   Para  3   Term  2   Preterm  1   AB  1   Living  3     SAB      TAB      Ectopic  1   Multiple      Live Births              Family History  Problem Relation Age of Onset  . Diabetes Father   . Diabetes Paternal Aunt   . Diabetes Paternal Grandmother   . Alcohol abuse Paternal Grandmother   . Alcohol abuse Paternal Uncle   . Cancer Maternal Grandmother  breast  . Breast cancer Maternal Grandmother   . Alcohol abuse Paternal Grandfather     Social History   Tobacco Use  . Smoking status: Never Smoker  . Smokeless tobacco: Never Used  Substance Use Topics  . Alcohol use: No  . Drug use: No    Home Medications Prior to Admission medications   Medication Sig Start Date End Date Taking? Authorizing Provider  cabozantinib (CABOMETYX) 40 MG tablet Take 1 tablet (40 mg total) by mouth daily. Take on an empty stomach, 1 hour before or 2 hours after meals. 06/21/20   Brunetta Genera, MD  cholecalciferol (VITAMIN D3) 25 MCG (1000 UNIT) tablet Take 1,000 Units by mouth daily.    [provider]  dexamethasone (DECADRON) 4 MG tablet Take 2 tablets (8 mg total) by mouth daily. Start the day after carboplatin chemotherapy for 3 days.  06/10/20   Brunetta Genera, MD  fluconazole (DIFLUCAN) 100 MG tablet Take 1 tablet (100 mg total) by mouth daily. 06/02/20   Benay Pike, MD  lidocaine-prilocaine (EMLA) cream Apply to affected area once 06/10/20   Brunetta Genera, MD  LORazepam (ATIVAN) 0.5 MG tablet Take 1 tablet (0.5 mg total) by mouth every 8 (eight) hours as needed for anxiety. 06/03/20   Maness, Arnette Norris, MD  LORazepam (ATIVAN) 0.5 MG tablet Take 1 tablet (0.5 mg total) by mouth every 6 (six) hours as needed (Nausea or vomiting). Patient not taking: Reported on 06/13/2020 06/10/20   Brunetta Genera, MD  mycophenolate (CELLCEPT) 250 MG capsule Take 500 mg by mouth 2 (two) times daily.  07/19/18   [provider]  omeprazole (PRILOSEC) 20 MG capsule Take 20 mg by mouth 2 (two) times daily as needed (for heart burn).     [provider]  ondansetron (ZOFRAN) 8 MG tablet Take 1 tablet (8 mg total) by mouth every 8 (eight) hours as needed for nausea or vomiting. 06/03/20   Maness, Arnette Norris, MD  ondansetron (ZOFRAN) 8 MG tablet Take 1 tablet (8 mg total) by mouth 2 (two) times daily as needed for refractory nausea / vomiting. Start on day 3 after carboplatin chemo. 06/10/20   Brunetta Genera, MD  oxyCODONE (ROXICODONE) 5 MG immediate release tablet Take 2 tablets (10 mg total) by mouth every 4 (four) hours as needed for moderate pain or severe pain. 06/03/20   Maness, Arnette Norris, MD  pravastatin (PRAVACHOL) 20 MG tablet Take 20 mg by mouth at bedtime.  Patient not taking: Reported on 06/13/2020 05/29/19   [provider]  predniSONE (DELTASONE) 5 MG tablet Take 5 mg by mouth daily with breakfast.  07/25/19   [provider]  prochlorperazine (COMPAZINE) 10 MG tablet Take 1 tablet (10 mg total) by mouth every 6 (six) hours as needed (Nausea or vomiting). 06/10/20   Brunetta Genera, MD  senna (SENOKOT) 8.6 MG TABS tablet Take 1 tablet (8.6 mg total) by mouth daily as needed for  moderate constipation. 02/19/20   Anderson, Chelsey L, DO  Tacrolimus ER (ENVARSUS XR) 1 MG TB24 Take 4 mg by mouth daily.  12/01/19   [provider]    Allergies    Lisinopril, Other, and Tape  Review of Systems   Review of Systems  Constitutional: Negative for fever.  HENT: Negative for sore throat.   Eyes: Negative for visual disturbance.  Respiratory: Negative for cough and shortness of breath.   Cardiovascular: Negative for chest pain.  Gastrointestinal: Positive for abdominal pain, nausea  and vomiting. Negative for constipation, diarrhea, hematemesis and hematochezia.  Genitourinary: Negative for dysuria and hematuria.  Musculoskeletal: Negative for neck pain.  Skin: Negative for rash.  Neurological: Negative for headaches.    Physical Exam Updated Vital Signs BP (!) 109/98   Pulse (!) 145   Temp 98.3 F (36.8 C) (Oral)   Resp 17   SpO2 100%   Physical Exam Vitals and nursing note reviewed.  Constitutional:      General: She is in acute distress.     Appearance: She is well-developed.  HENT:     Head: Normocephalic and atraumatic.  Eyes:     Conjunctiva/sclera: Conjunctivae normal.  Cardiovascular:     Rate and Rhythm: Regular rhythm. Tachycardia present.     Heart sounds: No murmur heard.   Pulmonary:     Effort: Pulmonary effort is normal. No respiratory distress.     Breath sounds: Normal breath sounds.  Abdominal:     Tenderness: There is generalized abdominal tenderness. There is no guarding or rebound.  Musculoskeletal:        General: No deformity or signs of injury. Normal range of motion.     Cervical back: Neck supple.  Skin:    General: Skin is warm and dry.     Capillary Refill: Capillary refill takes less than 2 seconds.  Neurological:     General: No focal deficit present.     Mental Status: She is alert.     ED Results / Procedures / Treatments   Labs (all labs ordered are listed, but only abnormal results are displayed) Labs  Reviewed  CBC WITH DIFFERENTIAL/PLATELET - Abnormal; Notable for the following components:      Result Value   WBC 25.1 (*)    Hemoglobin 11.9 (*)    MCH 25.9 (*)    RDW 23.4 (*)    nRBC 6.3 (*)    Neutro Abs 21.5 (*)    Monocytes Absolute 1.3 (*)    Abs Immature Granulocytes 1.21 (*)    All other components within normal limits  COMPREHENSIVE METABOLIC PANEL - Abnormal; Notable for the following components:   Potassium 5.2 (*)    Chloride 97 (*)    CO2 17 (*)    Glucose, Bld 168 (*)    BUN 38 (*)    Creatinine, Ser 1.42 (*)    Albumin 3.1 (*)    AST 1,461 (*)    ALT 586 (*)    Alkaline Phosphatase 646 (*)    Total Bilirubin 5.5 (*)    GFR, Estimated 47 (*)    Anion gap 24 (*)    All other components within normal limits  LIPASE, BLOOD - Abnormal; Notable for the following components:   Lipase 56 (*)    All other components within normal limits  LACTIC ACID, PLASMA - Abnormal; Notable for the following components:   Lactic Acid, Venous 10.3 (*)    All other components within normal limits  LACTIC ACID, PLASMA - Abnormal; Notable for the following components:   Lactic Acid, Venous 6.1 (*)    All other components within normal limits  I-STAT BETA HCG BLOOD, ED ([facility], [facility], [facility] ONLY) - Abnormal; Notable for the following components:   I-stat hCG, quantitative 8.5 (*)    All other components within normal limits  RESPIRATORY PANEL BY RT PCR (FLU A&B, COVID)  CULTURE, BLOOD (ROUTINE X 2)  CULTURE, BLOOD (ROUTINE X 2)  MRSA PCR SCREENING  HCG, SERUM, QUALITATIVE  URINALYSIS, ROUTINE  W REFLEX MICROSCOPIC  CBC  CREATININE, SERUM  CBC  LIPASE, BLOOD  COMPREHENSIVE METABOLIC PANEL    EKG EKG Interpretation  Date/Time:  Wednesday June 26 2020 12:28:46 EDT Ventricular Rate:  153 PR Interval:    QRS Duration: 75 QT Interval:  305 QTC Calculation: 487 R Axis:   52 Text Interpretation: Sinus tachycardia Ventricular premature complex Aberrant complex Consider right atrial  enlargement Borderline repolarization abnormality Borderline prolonged QT interval No significant change since prior 10/21 Confirmed by Aletta Edouard [phone]) on 06/26/2020 12:31:06 PM   Radiology CT ABDOMEN PELVIS WO CONTRAST  Result Date: 06/26/2020 CLINICAL DATA:  Diffuse abdominal pain and vomiting. History of cancer and renal transplant. Liver mass post recent liver biopsy. EXAM: CT ABDOMEN AND PELVIS WITHOUT CONTRAST TECHNIQUE: Multidetector CT imaging of the abdomen and pelvis was performed following the standard protocol without IV contrast. COMPARISON:  Contrast-enhanced exam 05/27/2020. FINDINGS: Lower chest: No focal airspace disease, pleural fluid, or pulmonary nodularity. Heart is normal in size. Hepatobiliary: Patient's known hepatic lesions are not well demonstrated on noncontrast exam. Liver parenchyma is heterogeneous. There may be a small subcapsular hematoma inferiorly from recent liver biopsy, not well-defined on this noncontrast exam. The gallbladder is present. There is high-density material in the gallbladder lumen, possibly sludge. No calcified gallstone. Common bile duct is poorly defined, but no evidence of biliary dilatation. Pancreas: No ductal dilatation or inflammation. Spleen: Normal in size without focal abnormality. Adrenals/Urinary Tract: Normal adrenal glands. Chronic bilateral native renal atrophy. Solid-appearing lesion in the lower left renal hilum was better appreciated on prior contrast-enhanced CT, grossly unchanged, series 2, image 35. Transplant kidney in the left lower quadrant. Scarring in the left lower pole is better appreciated on prior contrast-enhanced exam. Similar fullness of the renal collecting system without frank hydronephrosis. No transplant perinephric edema. Sequela of failed transplant in the right iliac fossa. The urinary bladder is unremarkable. Stomach/Bowel: Bowel evaluation is limited in the absence of enteric contrast. There is no bowel  obstruction or evidence of bowel inflammation. The appendix is normal. Vascular/Lymphatic: Bulky retroperitoneal adenopathy, some of which is low-density and typical of necrosis. Occasional areas of nodal calcification. Adenopathy extends from the retrocrural space to the bilateral common iliac arteries, and left external iliac station. There also enlarged bilateral inguinal lymph nodes, left greater than right. Index aortocaval node measures 4.1 x 2.4 cm, previously 4.2 x 2.6 cm. Overall adenopathy is not significantly changed in the interim allowing for noncontrast technique. There is aortic and branch atherosclerosis. Reproductive: Bulky uterus with fibroids.  No obvious adnexal mass. Other: There is trace free fluid in the pelvis, slightly increased from prior exam. No other free fluid or ascites. No free air. Musculoskeletal: Ill-defined sclerosis involving right L2 vertebral body, nonspecific but unchanged from previous. Sclerotic density in the left acetabulum. There is hemi transitional lumbosacral anatomy. No fracture or acute osseous abnormality. IMPRESSION: 1. Patient's known hepatic lesions are not well-defined on this noncontrast exam. There may be a small subcapsular hematoma inferiorly from recent liver biopsy, not well-defined. No evidence of hemoperitoneum. 2. Bulky retroperitoneal and bilateral inguinal adenopathy, grossly unchanged from prior. 3. Transplant kidney in the left lower quadrant with unchanged fullness of the renal collecting system. Sequela of failed transplant in the right iliac fossa. Stable soft tissue density in the region of the native left kidney lower hilum. 4. Bulky uterus with fibroids. 5. Nonspecific sclerosis involving the right aspect of L2 vertebral body. Aortic Atherosclerosis (ICD10-I70.0). Electronically Signed   By: Threasa Beards  [place] M.D.   On: 06/26/2020 16:45   DG Chest Port 1 View  Result Date: 06/26/2020 CLINICAL DATA:  Weakness EXAM: PORTABLE CHEST 1 VIEW  COMPARISON:  05/26/2020 FINDINGS: The heart size and mediastinal contours are within normal limits. Both lungs are clear. No pleural effusion or pneumothorax. The visualized skeletal structures are unremarkable. IMPRESSION: No acute process in the chest. Electronically Signed   By: Macy Mis M.D.   On: 06/26/2020 13:45    Procedures .Critical Care Performed by: Hayden Rasmussen, MD Authorized by: Hayden Rasmussen, MD   Critical care provider statement:    Critical care time (minutes):  90   Critical care start time:  06/26/2020 6:51 PM   Critical care time was exclusive of:  Separately billable procedures and treating other patients   Critical care was necessary to treat or prevent imminent or life-threatening deterioration of the following conditions:  Sepsis   Critical care was time spent personally by me on the following activities:  Discussions with consultants, evaluation of patient's response to treatment, examination of patient, ordering and performing treatments and interventions, ordering and review of laboratory studies, ordering and review of radiographic studies, pulse oximetry, re-evaluation of patient's condition, obtaining history from patient or surrogate, review of old charts and development of treatment plan with patient or surrogate   (including critical care time)  Medications Ordered in ED Medications  0.9 %  sodium chloride infusion ( Intravenous New Bag/Given 06/26/20 1439)  metroNIDAZOLE (FLAGYL) IVPB 500 mg (has no administration in time range)  vancomycin (VANCOCIN) IVPB 1000 mg/200 mL premix (has no administration in time range)  sodium chloride (PF) 0.9 % injection (has no administration in time range)  HYDROmorphone (DILAUDID) injection 1 mg (1 mg Intravenous Given 06/26/20 1222)  ondansetron (ZOFRAN) injection 4 mg (4 mg Intravenous Given 06/26/20 1221)  sodium chloride 0.9 % bolus 1,000 mL (1,000 mLs Intravenous New Bag/Given 06/26/20 1228)  sodium chloride  0.9 % bolus 1,000 mL (1,000 mLs Intravenous New Bag/Given 06/26/20 1439)  ceFEPIme (MAXIPIME) 2 g in sodium chloride 0.9 % 100 mL IVPB (2 g Intravenous New Bag/Given 06/26/20 1439)    ED Course  I have reviewed the triage vital signs and the nursing notes.  Pertinent labs & imaging results that were available during my care of the patient were reviewed by me and considered in my medical decision making (see chart for details).  Clinical Course as of Jun 27 1847  Wed Jun 26, 2020  1317 Discussed with Dr. Irene Limbo from oncology who had seen the patient earlier today.  He said she had received one dose of chemotherapy but was due to start a new regiment today.  He agrees with current management and recommended abdominal imaging.  They will follow along.   [MB]  2409 Chest x-ray interpreted by me is asymmetric haziness in left lung.   [MB]  7353 Reassessed patient her heart rate has come down somewhat.  Lactate is slowly clearing.  She has not vomited and she is asking for ice chips.   [MB]  1638 Switched her CT abdomen and pelvis to a noncontrast study as she is a renal transplant.   [MB]  2992 Discussed with Dr. Cathlean Sauer [facility] hospitalist who will evaluate the patient for admission.   [MB]    Clinical Course User Index [MB] Hayden Rasmussen, MD   MDM Rules/Calculators/A&P  This patient complains of intractable nausea and vomiting, abdominal pain; this involves an extensive number of treatment Options and is a complaint that carries with it a high risk of complications and Morbidity. The differential includes sepsis, Sirs, obstruction, pancreatitis, colitis, metabolic derangement, anemia  I ordered, reviewed and interpreted labs, which included CBC with markedly elevated white count, stable hemoglobin, chemistries with elevated potassium low bicarb elevated BUN/creatinine consistent with dehydration, elevated LFTs consistent with her known metastatic disease, lipase mildly  elevated, beta quant i-STAT slightly elevated, repeat qualitative sent to lab and is negative, lactate markedly elevated and somewhat clearing I ordered medication IV fluids IV antibiotics IV pain medication and nausea medication with some improvement in her symptoms I ordered imaging studies which included chest x-ray and CT and abdomen and pelvis noncontrast and I independently    visualized and interpreted imaging which showed no acute infiltrates, worsening metastatic disease Additional history obtained from oncology nurse who brought her down from clinic Previous records obtained and reviewed in epic including recent oncology notes I consulted oncology Dr. Irene Limbo and [facility] hospitalist Dr. Cathlean Sauer and discussed lab and imaging findings  Critical Interventions: Work-up and intervention of patient's sepsis and intractable vomiting.  After the interventions stated above, I reevaluated the patient and found hemodynamically to be somewhat improved and symptomatically feeling better.  She will need admission to the hospital for continued management of her symptoms and further work-up.  Sepsis - Repeat Assessment  Performed at:    1600   Vitals     Blood pressure (!) 109/98, pulse (!) 145, temperature 98.3 F (36.8 C), temperature source Oral, resp. rate 17, SpO2 100 %.  Heart:     Tachycardic  Lungs:    CTA  Capillary Refill:   <2 sec  Peripheral Pulse:   Radial pulse palpable  Skin:     Dry   Final Clinical Impression(s) / ED Diagnoses Final diagnoses:  Severe sepsis (HCC)  Generalized abdominal pain  Non-intractable vomiting with nausea, unspecified vomiting type  Metastatic malignant neoplasm, unspecified site [facility])    Rx / DC Orders ED Discharge Orders    None       Hayden Rasmussen, MD 06/26/20 [phone]

## 2020-06-26 NOTE — Progress Notes (Signed)
A consult was received from an ED physician for vancomycin and cefepime per pharmacy dosing.  The patient's profile has been reviewed for ht/wt/allergies/indication/available labs.    A one time order has been placed for cefepime 2gm and vancomycin 1gm.  Further antibiotics/pharmacy consults should be ordered by admitting physician if indicated.                       Thank you, Lynelle Doctor 06/26/2020  1:59 PM

## 2020-06-26 NOTE — Sepsis Progress Note (Signed)
Notified provider of need to order antibiotics and blood cultures.

## 2020-06-26 NOTE — Progress Notes (Signed)
HEMATOLOGY/ONCOLOGY CONSULTATION NOTE  Date of Service: 06/26/2020  Patient Care Team: Richarda Osmond, DO as PCP - General  CHIEF COMPLAINTS/PURPOSE OF CONSULTATION:  Newly renal cell carcinoma  HISTORY OF PRESENTING ILLNESS:  Candace Griffin is a wonderful 44 y.o. female with a past medical history significant for chronic glomerulonephritis status post renal transplant in July 2019 and currently taking mycophenolate, tacrolimus, and prednisone, anemia, GERD, hypertension, menorrhagia.  The patient presented to the emergency room with chest pain or shortness of breath for several days.  In the emergency room, her hemoglobin was 4.0 (previously 8.6 on 05/15/2020) and chemistry showed a creatinine of 1.13, calcium 8.0, albumin 2.0, AST 336, ALT 105, alkaline phosphatase 242, and T bili 1.5.  2 units of PRBCs have been ordered.  She had a CTA of the chest which was negative for PE or any other acute finding.  CT of the abdomen and pelvis showed redemonstrated inguinal and intra-abdominal lymphadenopathy, multiple liver lesions-findings concerning for metastatic disease, lymphoma, or post transplant lymphoproliferative disease, 2.2 cm native left lower pole renal mass, left lower quadrant transplant kidney with areas of cortical scarring.  Of note, the retroperitoneal, pelvic, and inguinal lymphadenopathy was present on a CT scan performed on 05/16/2020.  She was referred back to her primary care provider for further work-up of the lymphadenopathy.  She states that she was scheduled to see her primary care provider today to follow-up on these results but ended up coming to the emergency room instead.  She has been seen by interventional radiology for for consideration of CT-guided biopsy of one of her inguinal lymph nodes and underwent CT-guided biopsy of the left inguinal lymph node just prior to my visit.  The patient is somewhat sleepy at the time of visit since she just returned from IR.   She is able to awaken and answer questions.  She states that she has had fatigue and generalized weakness as well as a weight loss of about 20 pounds since the end of August 2021.  She has not been able to eat or drink much of anything secondary to nausea and vomiting.  She reports fevers, but no chills or night sweats.  She is able to palpate the lymph nodes in her inguinal area.  She denies headaches and dizziness.  She has been experiencing chest pain or shortness of breath.  Denies abdominal pain.  She reports significant constipation over the past month.  Denies epistaxis, hemoptysis, hematemesis, hematuria, melena, hematochezia.  Denies lower extremity edema.  Medical oncology was asked to see the patient for recommendations regarding her abnormal CT scan findings and anemia.  INTERVAL HISTORY:  Candace Griffin is a wonderful 44 y.o. female who is here for the evaluation and management of metastatic carcinoma of unknown primary. The patient's last visit with [place] was on 06/13/2020. The pt reports that she is doing well overall.  The pt reports that she has not been able to hold any food or liquid down for the last few days this includes any of her medications. She is experiencing severe abdominal pain and nausea. This discomfort began overnight and none of her pain medication has helped. Pt denies any issues urinating and had her last bowel movement two days ago.   Lab results today (06/26/20) of CBC w/diff and CMP is as follows: all values are WNL except for WBC at 22.2K, MCV at 79.6, MCH at 24.9, RDW at 22.2, nRBC at 7.8, Neutro Abs at 18.7K, Mono Abs  at 1.1K, Abs Immature Granulocytes at 0.91K, CO2 at 20, Glucose at 175, BUN at 35, Creatinine at 1.38, Albumin at 2.9, AST at 1687, ALT at 665, ALP at 767, Total Bilirubin at 5.1, GFR Est at 49, Anion gap at 22. 06/26/2020 LDH at 1454 06/26/2020 TSH at 2.569 06/26/2020 T4 free at 1.45 06/26/2020 Magnesium at 2.2  On review of systems, pt reports  abdominal pain, nausea, emesis, constipation and denies dysuria and any other symptoms.   MEDICAL HISTORY:  Past Medical History:  Diagnosis Date  . Chronic glomerulonephritis   . Dialysis patient ([place])   . End stage renal disease ([place])   . GERD (gastroesophageal reflux disease)   . History of renal transplant 16-Oct-2011  . Hypertension     SURGICAL HISTORY: Past Surgical History:  Procedure Laterality Date  . COLPOSCOPY  2016  . diaylsis shunt Right    arm  . KIDNEY TRANSPLANT  10-16-2011   deceased donor kidney    SOCIAL HISTORY: Social History   Socioeconomic History  . Marital status: Single    Spouse name: Not on file  . Number of children: 3  . Years of education: 3  . Highest education level: Not on file  Occupational History    Comment: NA  Tobacco Use  . Smoking status: Never Smoker  . Smokeless tobacco: Never Used  Substance and Sexual Activity  . Alcohol use: No  . Drug use: No  . Sexual activity: Not Currently    Birth control/protection: None  Other Topics Concern  . Not on file  Social History Narrative   Lives at home with children   Caffeine - 10/27/16 "addicted to [employer], drank all day long", currently Mtn Dew 12 oz/week   Social Determinants of Health   Financial Resource Strain:   . Difficulty of Paying Living Expenses: Not on file  Food Insecurity:   . Worried About Charity fundraiser in the Last Year: Not on file  . Ran Out of Food in the Last Year: Not on file  Transportation Needs:   . Lack of Transportation (Medical): Not on file  . Lack of Transportation (Non-Medical): Not on file  Physical Activity:   . Days of Exercise per Week: Not on file  . Minutes of Exercise per Session: Not on file  Stress:   . Feeling of Stress : Not on file  Social Connections:   . Frequency of Communication with Friends and Family: Not on file  . Frequency of Social Gatherings with Friends and Family: Not on file  . Attends Religious Services: Not on  file  . Active Member of Clubs or Organizations: Not on file  . Attends Archivist Meetings: Not on file  . Marital Status: Not on file  Intimate Partner Violence:   . Fear of Current or Ex-Partner: Not on file  . Emotionally Abused: Not on file  . Physically Abused: Not on file  . Sexually Abused: Not on file    FAMILY HISTORY: Family History  Problem Relation Age of Onset  . Diabetes Father   . Diabetes Paternal Aunt   . Diabetes Paternal Grandmother   . Alcohol abuse Paternal Grandmother   . Alcohol abuse Paternal Uncle   . Cancer Maternal Grandmother        breast  . Breast cancer Maternal Grandmother   . Alcohol abuse Paternal Grandfather     ALLERGIES:  is allergic to lisinopril, other, and tape.  MEDICATIONS:  Current Outpatient Medications  Medication Sig Dispense Refill  . cabozantinib (CABOMETYX) 40 MG tablet Take 1 tablet (40 mg total) by mouth daily. Take on an empty stomach, 1 hour before or 2 hours after meals. 30 tablet 1  . cholecalciferol (VITAMIN D3) 25 MCG (1000 UNIT) tablet Take 1,000 Units by mouth daily.    Marland Kitchen dexamethasone (DECADRON) 4 MG tablet Take 2 tablets (8 mg total) by mouth daily. Start the day after carboplatin chemotherapy for 3 days. 30 tablet 1  . fluconazole (DIFLUCAN) 100 MG tablet Take 1 tablet (100 mg total) by mouth daily. 1 tablet 0  . lidocaine-prilocaine (EMLA) cream Apply to affected area once 30 g 3  . LORazepam (ATIVAN) 0.5 MG tablet Take 1 tablet (0.5 mg total) by mouth every 8 (eight) hours as needed for anxiety. 45 tablet 0  . LORazepam (ATIVAN) 0.5 MG tablet Take 1 tablet (0.5 mg total) by mouth every 6 (six) hours as needed (Nausea or vomiting). (Patient not taking: Reported on 06/13/2020) 30 tablet 0  . mycophenolate (CELLCEPT) 250 MG capsule Take 500 mg by mouth 2 (two) times daily.     Marland Kitchen omeprazole (PRILOSEC) 20 MG capsule Take 20 mg by mouth 2 (two) times daily as needed (for heart burn).     . ondansetron (ZOFRAN)  8 MG tablet Take 1 tablet (8 mg total) by mouth every 8 (eight) hours as needed for nausea or vomiting. 30 tablet 0  . ondansetron (ZOFRAN) 8 MG tablet Take 1 tablet (8 mg total) by mouth 2 (two) times daily as needed for refractory nausea / vomiting. Start on day 3 after carboplatin chemo. 30 tablet 1  . oxyCODONE (ROXICODONE) 5 MG immediate release tablet Take 2 tablets (10 mg total) by mouth every 4 (four) hours as needed for moderate pain or severe pain. 60 tablet 0  . pravastatin (PRAVACHOL) 20 MG tablet Take 20 mg by mouth at bedtime.  (Patient not taking: Reported on 06/13/2020)    . predniSONE (DELTASONE) 5 MG tablet Take 5 mg by mouth daily with breakfast.     . prochlorperazine (COMPAZINE) 10 MG tablet Take 1 tablet (10 mg total) by mouth every 6 (six) hours as needed (Nausea or vomiting). 30 tablet 1  . senna (SENOKOT) 8.6 MG TABS tablet Take 1 tablet (8.6 mg total) by mouth daily as needed for moderate constipation. 90 tablet 0  . Tacrolimus ER (ENVARSUS XR) 1 MG TB24 Take 4 mg by mouth daily.      No current facility-administered medications for this visit.    REVIEW OF SYSTEMS:   A 10+ POINT REVIEW OF SYSTEMS WAS OBTAINED including neurology, dermatology, psychiatry, cardiac, respiratory, lymph, extremities, GI, GU, Musculoskeletal, constitutional, breasts, reproductive, HEENT.  All pertinent positives are noted in the HPI.  All others are negative.   PHYSICAL EXAMINATION: ECOG PERFORMANCE STATUS: 2 - Symptomatic, <50% confined to bed  . Vitals:   06/26/20 1054 06/26/20 1106  BP: (!) 86/68 115/64  Pulse: (!) 152 (!) 148  Resp: (!) 22 (!) 24  Temp: (!) 97.4 F (36.3 C)   SpO2: 98% 100%   There were no vitals filed for this visit. .There is no height or weight on file to calculate BMI.  Exam was given in a wheelchair   GENERAL:alert, in no acute distress and comfortable SKIN: no acute rashes, no significant lesions EYES: conjunctiva are pink and non-injected, sclera  anicteric OROPHARYNX: MMM, no exudates, no oropharyngeal erythema or ulceration NECK: supple, no JVD LYMPH:  no palpable  lymphadenopathy in the cervical, axillary or inguinal regions LUNGS: clear to auscultation b/l with normal respiratory effort HEART: regular rate & rhythm ABDOMEN:  normoactive bowel sounds, not distended. No palpable hepatosplenomegaly. Tenderness to palpation. Extremity: no pedal edema PSYCH: alert & oriented x 3 with fluent speech NEURO: no focal motor/sensory deficits  LABORATORY DATA:  I have reviewed the data as listed  . CBC Latest Ref Rng & Units 06/13/2020 06/06/2020 06/01/2020  WBC 4.0 - 10.5 K/uL 9.5 6.9 4.8  Hemoglobin 12.0 - 15.0 g/dL 11.3(L) 10.7(L) 8.8(L)  Hematocrit 36 - 46 % 35.2(L) 34.0(L) 29.3(L)  Platelets 150 - 400 K/uL 421(H) 487(H) 236    . CMP Latest Ref Rng & Units 06/13/2020 06/06/2020 06/01/2020  Glucose 70 - 99 mg/dL 144(H) 93 95  BUN 6 - 20 mg/dL 22(H) 13 10  Creatinine 0.44 - 1.00 mg/dL 1.02(H) 1.00 1.09(H)  Sodium 135 - 145 mmol/L 134(L) 135 137  Potassium 3.5 - 5.1 mmol/L 4.1 5.3(H) 3.9  Chloride 98 - 111 mmol/L 97(L) 95(L) 106  CO2 22 - 32 mmol/L 21(L) 25 20(L)  Calcium 8.9 - 10.3 mg/dL 10.0 10.1 8.7(L)  Total Protein 6.5 - 8.1 g/dL 7.5 7.8 5.3(L)  Total Bilirubin 0.3 - 1.2 mg/dL 3.4(H) 2.8(H) 2.3(H)  Alkaline Phos 38 - 126 U/L 558(H) 403(H) 287(H)  AST 15 - 41 U/L 740(HH) 68(H) 80(H)  ALT 0 - 44 U/L 394(HH) 41 69(H)       RADIOGRAPHIC STUDIES: I have personally reviewed the radiological images as listed and agreed with the findings in the report. US BIOPSY (LIVER)  Result Date: 05/31/2020 INDICATION: 44 year old female with widespread nodal and hepatic metastatic disease of uncertain etiology. She does have a solid endophytic mass in the left renal hilum. Patient recently underwent ultrasound-guided core biopsy of a metastatic left inguinal lymph node. Biopsy specimens were diagnostic for carcinoma but nonspecific as to  tissue source. Therefore, patient presents for repeat biopsy of 1 of the hepatic lesions. EXAM: ULTRASOUND BIOPSY CORE LIVER MEDICATIONS: None. ANESTHESIA/SEDATION: Moderate (conscious) sedation was employed during this procedure. A total of Versed 2 mg and Fentanyl 100 mcg was administered intravenously. Moderate Sedation Time: 9 minutes. The patient's level of consciousness and vital signs were monitored continuously by radiology nursing throughout the procedure under my direct supervision. FLUOROSCOPY TIME:  None COMPLICATIONS: None immediate. PROCEDURE: Informed written consent was obtained from the patient after a thorough discussion of the procedural risks, benefits and alternatives. All questions were addressed. Maximal Sterile Barrier Technique was utilized including caps, mask, sterile gowns, sterile gloves, sterile drape, hand hygiene and skin antiseptic. A timeout was performed prior to the initiation of the procedure. The liver was interrogated with ultrasound. A well-defined hepatic lesion was successfully identified. A suitable skin entry site was selected and marked. The overlying skin was sterilely prepped and draped in the standard fashion using chlorhexidine skin prep. Local anesthesia was attained by infiltration with 1% lidocaine. A small dermatotomy was made. Under real-time ultrasound guidance, a 17 gauge introducer needle was advanced through the liver and positioned at the margin of the mass. Multiple 18 gauge core biopsies were then obtained coaxially using a bio Pince automated biopsy device. Biopsy specimens were placed in formalin and delivered to pathology for further analysis. As the introducer needle was removed, the biopsy tract was embolized with a Gel-Foam slurry. The patient tolerated the procedure well. IMPRESSION: Technically successful ultrasound-guided core biopsy of liver lesion. Signed, Criselda Peaches, MD, [facility]  [facility]  Radiology Electronically Signed   By: Jacqulynn Cadet M.D.   On: 05/31/2020 16:48   [place] CORE BIOPSY (LYMPH NODES)  Result Date: 05/27/2020 INDICATION: History of previous renal transplantation (x2), now with bulky retroperitoneal, pelvic and bilateral inguinal lymphadenopathy as well as indeterminate liver lesions. Please from ultrasound-guided left inguinal lymph node biopsy for tissue diagnostic purposes. EXAM: ULTRASOUND-GUIDED LEFT INGUINAL LYMPH NODE BIOPSY COMPARISON:  CT of the chest, abdomen and pelvis - 05/27/2020 MEDICATIONS: None ANESTHESIA/SEDATION: Moderate (conscious) sedation was employed during this procedure. A total of Versed 1 mg and Fentanyl 50 mcg was administered intravenously. Moderate Sedation Time: 10 minutes. The patient's level of consciousness and vital signs were monitored continuously by radiology nursing throughout the procedure under my direct supervision. COMPLICATIONS: None immediate. TECHNIQUE: Informed written consent was obtained from the patient after a discussion of the risks, benefits and alternatives to treatment. Questions regarding the procedure were encouraged and answered. Initial ultrasound scanning demonstrated an approximately 1.7 x 1.7 x 1.6 cm hypoechoic lymph node within the left groin, correlating with dominant left inguinal lymph node seen on preceding abdominal CT image 85, series 5. an ultrasound image was saved for documentation purposes. The procedure was planned. A timeout was performed prior to the initiation of the procedure. The operative was prepped and draped in the usual sterile fashion, and a sterile drape was applied covering the operative field. A timeout was performed prior to the initiation of the procedure. Local anesthesia was provided with 1% lidocaine with epinephrine. Under direct ultrasound guidance, an 18 gauge core needle device was utilized to obtain to obtain 8 core needle biopsies of the dominant indeterminate left inguinal lymph  node. The samples were placed in saline and submitted to pathology. The needle was removed and hemostasis was achieved with manual compression. Post procedure scan was negative for significant hematoma. A dressing was placed. The patient tolerated the procedure well without immediate postprocedural complication. IMPRESSION: Technically successful ultrasound guided biopsy of dominant indeterminate left inguinal lymph node. Electronically Signed   By: Sandi Mariscal M.D.   On: 05/27/2020 13:59   US PELVIC COMPLETE WITH TRANSVAGINAL  Result Date: 05/30/2020 CLINICAL DATA:  Metastatic cancer unknown primary EXAM: TRANSABDOMINAL AND TRANSVAGINAL ULTRASOUND OF PELVIS TECHNIQUE: Both transabdominal and transvaginal ultrasound examinations of the pelvis were performed. Transabdominal technique was performed for global imaging of the pelvis including uterus, ovaries, adnexal regions, and pelvic cul-de-sac. It was necessary to proceed with endovaginal exam following the transabdominal exam to visualize the uterus endometrium ovaries. COMPARISON:  CT 05/27/2020 FINDINGS: Uterus Measurements: 10.1 x 6.1 x 5.4 cm = volume: 172.5 mL. Nabothian cysts in the cervix. Left posterior myometrial mass measuring 1.4 x 1.8 x 1.1 cm. Right anterior intramural fundal mass measuring 1.6 x 2.1 x 1.6 cm. Endometrium Thickness: 6 mm.  No focal abnormality visualized. Right ovary Measurements: 1.5 x 1.8 x 2.1 cm = volume: 3.1 mL. Normal appearance/no adnexal mass. Left ovary Measurements: 1.4 x 1.8 x 1.8 cm = volume: 2.4 mL. Normal appearance/no adnexal mass. Other findings Moderate free fluid in the pelvis. IMPRESSION: 1. Negative for ovarian mass lesion. 2. Uterine fibroids 3. Moderate free fluid in the pelvis Electronically Signed   By: Donavan Foil M.D.   On: 05/30/2020 21:51   US Abdomen Limited RUQ  Result Date: 06/01/2020 CLINICAL DATA:  Liver mass.  Recent biopsy.  Pain post biopsy EXAM: ULTRASOUND ABDOMEN LIMITED RIGHT UPPER  QUADRANT COMPARISON:  05/31/2020 FINDINGS: Gallbladder: There is gallbladder sludge without sonographic  evidence for acute cholecystitis. There is no gallbladder wall thickening. The sonographic Percell Miller sign is negative. Common bile duct: Diameter: 4 mm Liver: Multiple hepatic lesions are again noted phi, better appreciated on the patient's prior CT. Portal vein is patent on color Doppler imaging with normal direction of blood flow towards the liver. Other: There is a trace amount of perihepatic free fluid. IMPRESSION: Trace perihepatic free fluid. Otherwise, stable exam with multiple hepatic lesions again identified. Electronically Signed   By: Constance Holster M.D.   On: 06/01/2020 03:42    ASSESSMENT & PLAN:   44 yo with   1) Metastatic carcinoma of unknown primary-- now noted to be renal papillary carcinoma on caner type ID testing.75% certainty. Patient with h/o renal transplantation on tacrolimus, prednisone and cellcept chronic immunosuppression with extensive metastatic disease with extensive abd/pelvic LNadenopathy and hepatic metastases.  IHC from LN and liver metastases -- non specific morphology and IC ? Gyn vs renal tubular origin. Tumor markers unrevealing Component     Latest Ref Rng & Units 05/29/2020 05/31/2020  CEA     0.0 - 4.7 ng/mL 0.9   AFP, Serum, Tumor Marker     0.0 - 8.3 ng/mL 1.7   CA 19-9     0 - 35 U/mL 24   Cancer Antigen (CA) 125     0.0 - 38.1 U/mL 20.1   hCG Quant     mIU/mL 3   LDH     98 - 192 U/L  178   PLAN: -Discussed pt labwork today, 06/26/20; WBC are elevated, PLT & Hgb are nml, liver enzymes & LDH are significantly elevated, kidney numbers have bumped up, Magnesium & TSH are WNL. -discussed cancer type ID shows this tumor is papillary renal cell carcinoma with 17% certainly -will plan to start on cabozantinib once symptoms controlled. Dose reduction to 20mg  po daily in setting of significant liver abnormalities -f/u with nephrology/renal  transplant team to try to minimize immunosuppression as much as possible. -Sent pt to the ER stat for uncontrolled abdominal pain/nausea, emesis, and dehydration.   FOLLOW UP: RTC with Dr Irene Limbo with labs in 3 weeks   The total time spent in the appt was 30 minutes and more than 50% was on counseling and direct patient cares.  All of the patient's questions were answered with apparent satisfaction. The patient knows to call the clinic with any problems, questions or concerns.    Sullivan Lone MD [place] AAHIVMS [facility] Hematology/Oncology Physician [facility]  (Office):       [phone] (Work cell):  [phone] (Fax):           [phone]  06/26/2020 8:10 AM  I, Yevette Edwards, am acting as a scribe for Dr. Sullivan Lone.   .I have reviewed the above documentation for accuracy and completeness, and I agree with the above. Brunetta Genera MD

## 2020-06-26 NOTE — Sepsis Progress Note (Signed)
Code Sepsis protocol is being followed by e[facility]

## 2020-06-26 NOTE — ED Triage Notes (Signed)
Pt arrived from cancer center, c/o diffuse abd pain and vomiting. Given 1mg  dilaudid and phenergan IM PTA

## 2020-06-27 ENCOUNTER — Other Ambulatory Visit: Payer: Self-pay | Admitting: Hematology

## 2020-06-27 ENCOUNTER — Inpatient Hospital Stay (HOSPITAL_COMMUNITY): Payer: Medicare Other

## 2020-06-27 DIAGNOSIS — K859 Acute pancreatitis without necrosis or infection, unspecified: Secondary | ICD-10-CM | POA: Diagnosis not present

## 2020-06-27 DIAGNOSIS — C787 Secondary malignant neoplasm of liver and intrahepatic bile duct: Secondary | ICD-10-CM

## 2020-06-27 DIAGNOSIS — R1084 Generalized abdominal pain: Secondary | ICD-10-CM | POA: Diagnosis not present

## 2020-06-27 DIAGNOSIS — C649 Malignant neoplasm of unspecified kidney, except renal pelvis: Secondary | ICD-10-CM | POA: Diagnosis not present

## 2020-06-27 DIAGNOSIS — K219 Gastro-esophageal reflux disease without esophagitis: Secondary | ICD-10-CM | POA: Diagnosis not present

## 2020-06-27 DIAGNOSIS — I10 Essential (primary) hypertension: Secondary | ICD-10-CM | POA: Diagnosis not present

## 2020-06-27 DIAGNOSIS — N179 Acute kidney failure, unspecified: Secondary | ICD-10-CM | POA: Diagnosis not present

## 2020-06-27 DIAGNOSIS — K759 Inflammatory liver disease, unspecified: Secondary | ICD-10-CM

## 2020-06-27 LAB — COMPREHENSIVE METABOLIC PANEL
ALT: 659 U/L — ABNORMAL HIGH (ref 0–44)
AST: 1732 U/L — ABNORMAL HIGH (ref 15–41)
Albumin: 2.4 g/dL — ABNORMAL LOW (ref 3.5–5.0)
Alkaline Phosphatase: 463 U/L — ABNORMAL HIGH (ref 38–126)
Anion gap: 12 (ref 5–15)
BUN: 29 mg/dL — ABNORMAL HIGH (ref 6–20)
CO2: 19 mmol/L — ABNORMAL LOW (ref 22–32)
Calcium: 8 mg/dL — ABNORMAL LOW (ref 8.9–10.3)
Chloride: 108 mmol/L (ref 98–111)
Creatinine, Ser: 1.07 mg/dL — ABNORMAL HIGH (ref 0.44–1.00)
GFR, Estimated: >60 mL/min (ref >60)
Glucose, Bld: 181 mg/dL — ABNORMAL HIGH (ref 70–99)
Potassium: 5.1 mmol/L (ref 3.5–5.1)
Sodium: 139 mmol/L (ref 135–145)
Total Bilirubin: 4.4 mg/dL — ABNORMAL HIGH (ref 0.3–1.2)
Total Protein: 5.9 g/dL — ABNORMAL LOW (ref 6.5–8.1)

## 2020-06-27 LAB — URINALYSIS, ROUTINE W REFLEX MICROSCOPIC
Bilirubin Urine: NEGATIVE
Glucose, UA: NEGATIVE mg/dL
Ketones, ur: 5 mg/dL — AB
Leukocytes,Ua: NEGATIVE
Nitrite: NEGATIVE
Protein, ur: NEGATIVE mg/dL
Specific Gravity, Urine: 1.023 (ref 1.005–1.030)
pH: 5 (ref 5.0–8.0)

## 2020-06-27 LAB — CBC
HCT: 30.2 % — ABNORMAL LOW (ref 36.0–46.0)
Hemoglobin: 9.2 g/dL — ABNORMAL LOW (ref 12.0–15.0)
MCH: 25.9 pg — ABNORMAL LOW (ref 26.0–34.0)
MCHC: 30.5 g/dL (ref 30.0–36.0)
MCV: 85.1 fL (ref 80.0–100.0)
Platelets: 154 10*3/uL (ref 150–400)
RBC: 3.55 MIL/uL — ABNORMAL LOW (ref 3.87–5.11)
RDW: 22.5 % — ABNORMAL HIGH (ref 11.5–15.5)
WBC: 19.7 10*3/uL — ABNORMAL HIGH (ref 4.0–10.5)
nRBC: 7.4 % — ABNORMAL HIGH (ref 0.0–0.2)

## 2020-06-27 LAB — LIPASE, BLOOD: Lipase: 43 U/L (ref 11–51)

## 2020-06-27 LAB — HEPATITIS PANEL, ACUTE
HCV Ab: NONREACTIVE
Hep A IgM: NONREACTIVE
Hep B C IgM: NONREACTIVE
Hepatitis B Surface Ag: NONREACTIVE

## 2020-06-27 LAB — PROTIME-INR
INR: 1.5 — ABNORMAL HIGH (ref 0.8–1.2)
Prothrombin Time: 17.7 seconds — ABNORMAL HIGH (ref 11.4–15.2)

## 2020-06-27 LAB — ACETAMINOPHEN LEVEL: Acetaminophen (Tylenol), Serum: 10 ug/mL (ref 10–30)

## 2020-06-27 IMAGING — US US ABDOMEN LIMITED
1 series · 13 of 25 positions shown · non-contrast
Comparison: Noncontrast abdominal CT yesterday. Contrast-enhanced
abdominal CT [DATE]

CLINICAL DATA: Abdominal pain.  Known liver lesions.

EXAM:
ULTRASOUND ABDOMEN LIMITED RIGHT UPPER QUADRANT

[Series 1: us abdomen limited · 13 of 76 slices shown]
[im 1/76]
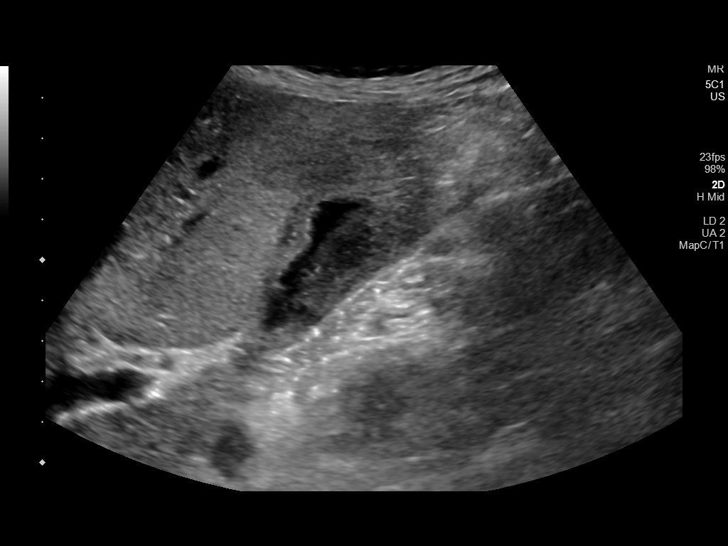
[im 7/76]
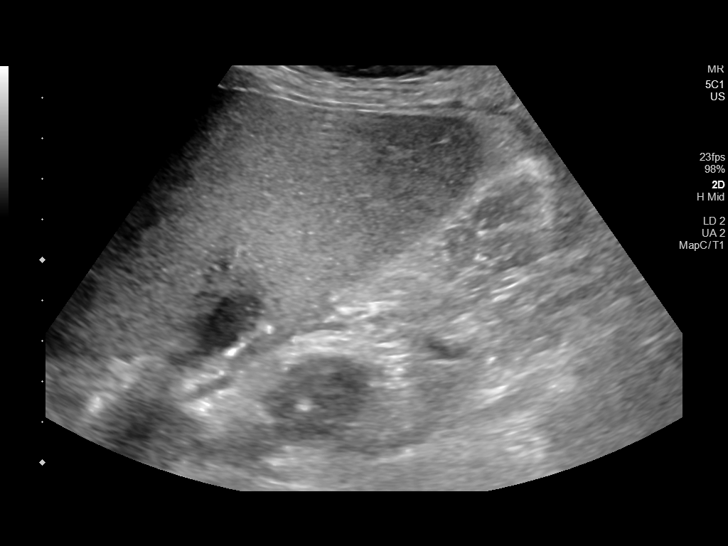
[im 13/76]
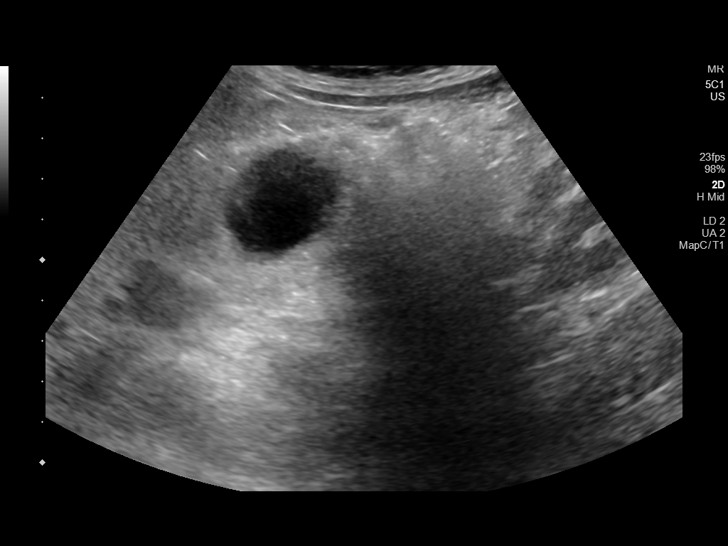
[im 19/76]
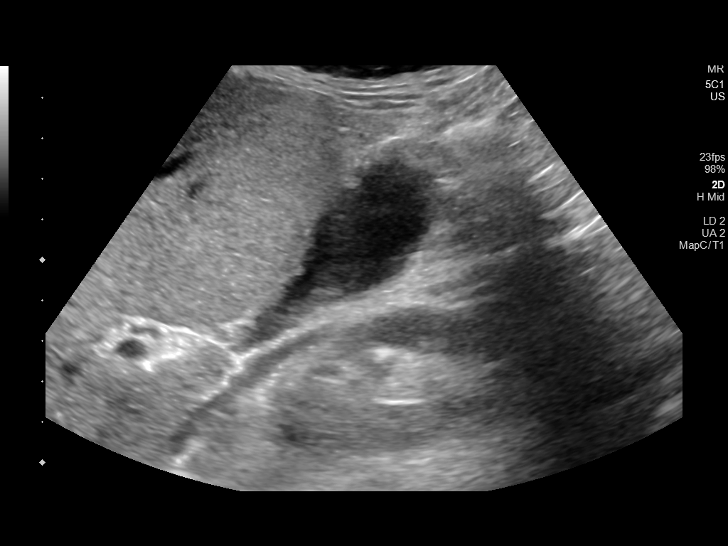
[im 26/76]
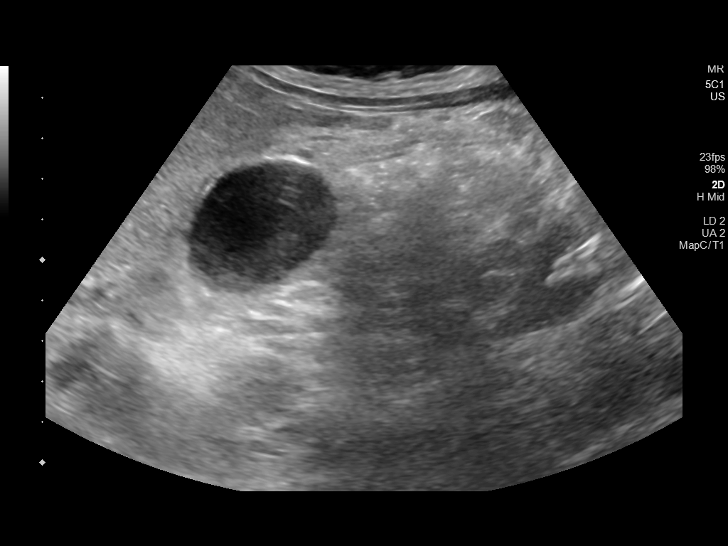
[im 32/76]
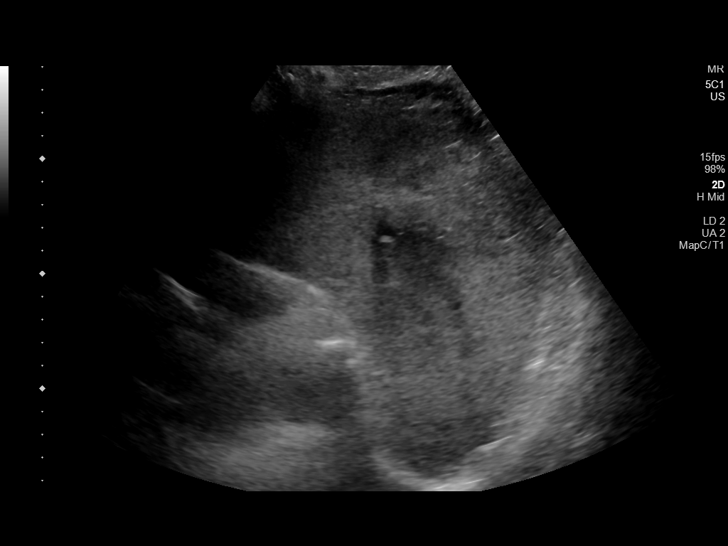
[im 38/76]
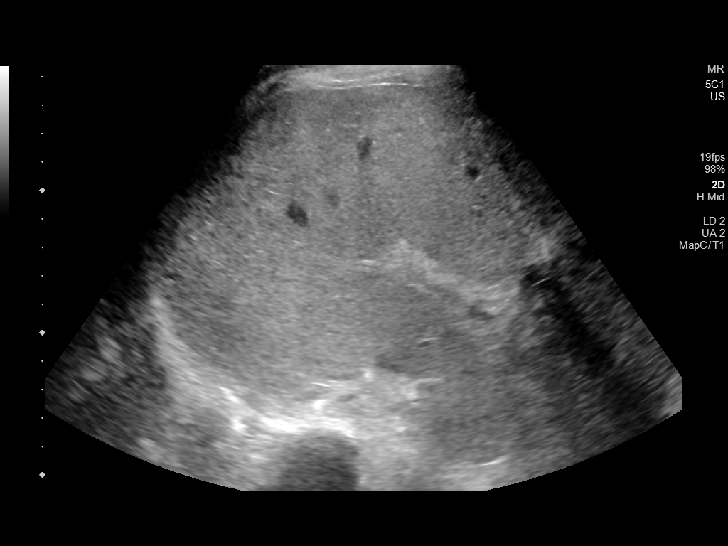
[im 44/76]
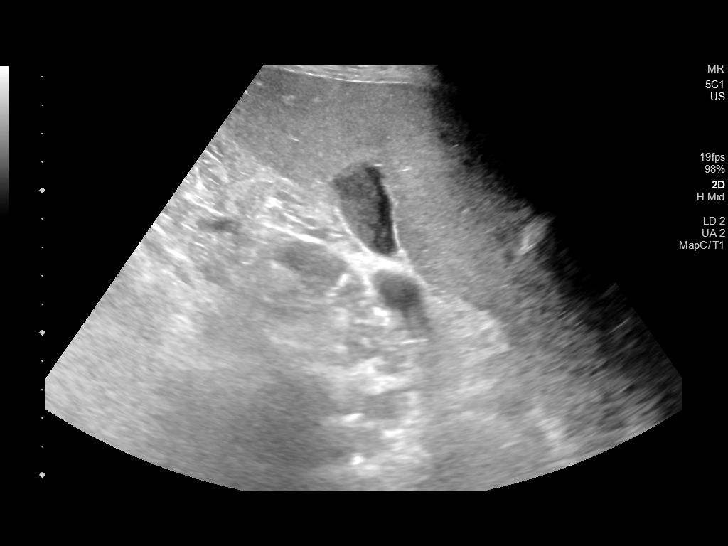
[im 51/76]
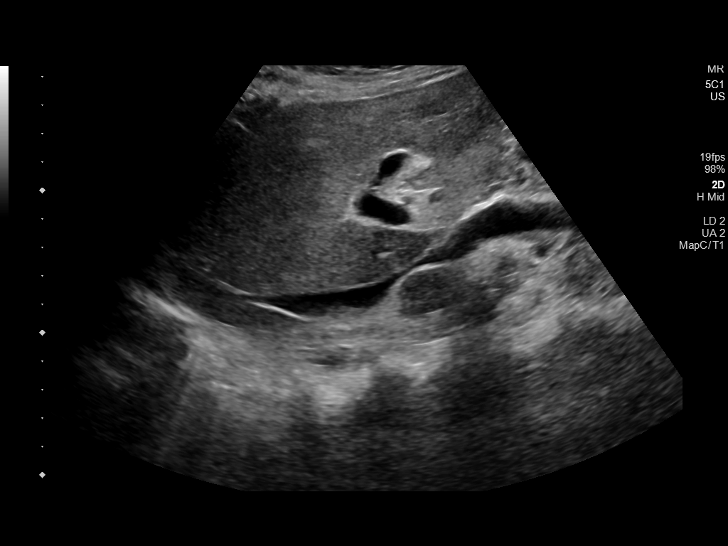
[im 57/76]
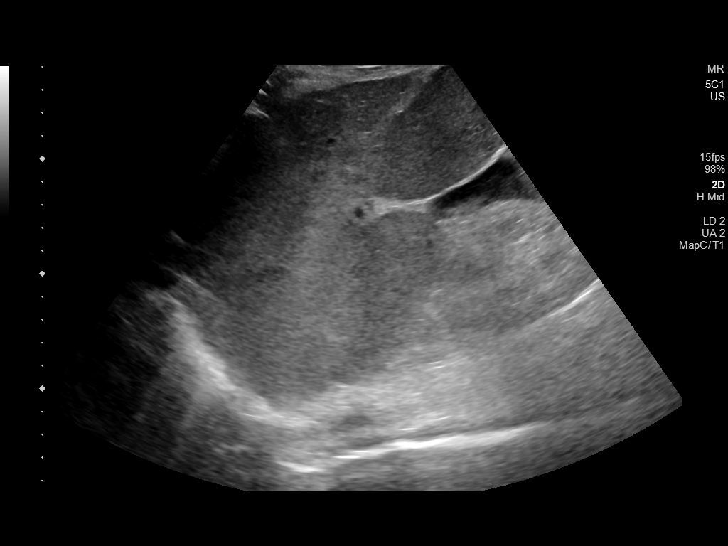
[im 63/76]
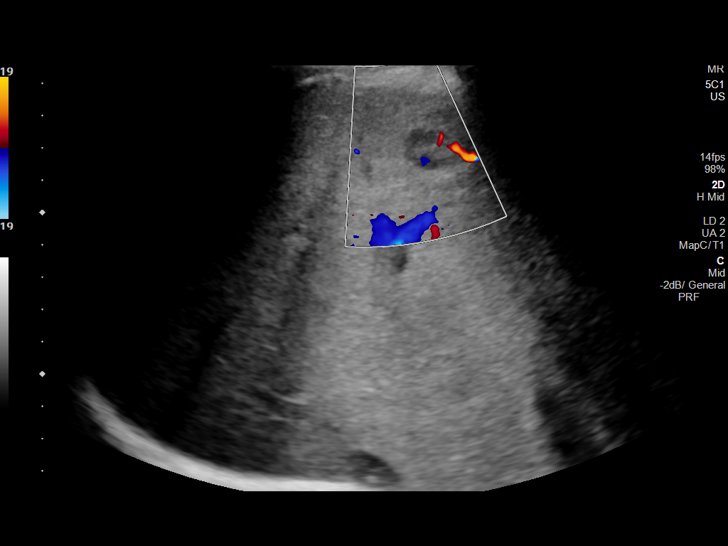
[im 69/76]
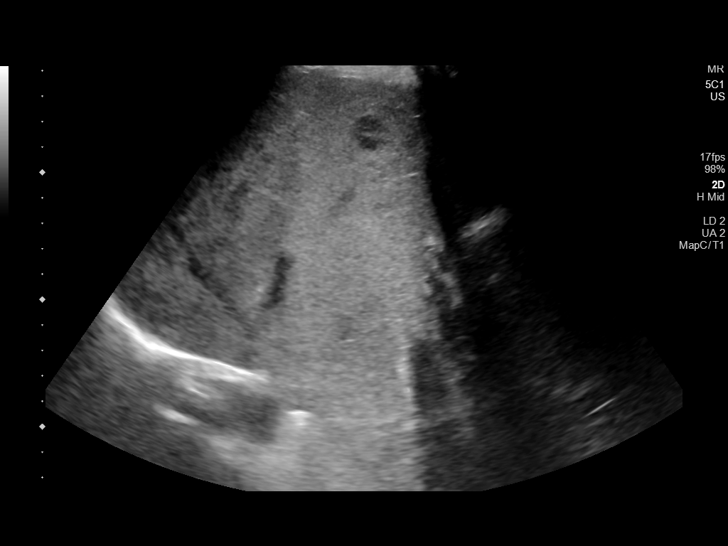
[im 76/76]
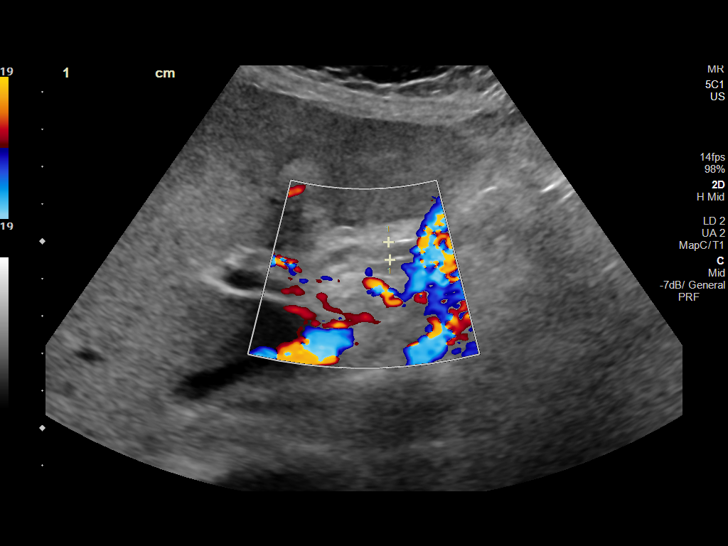

[13 of 25 positions shown; findings below may reference images not displayed]

FINDINGS: Gallbladder:

Physiologically distended containing intraluminal sludge. No
shadowing stones. No wall thickening. No sonographic Murphy sign
noted by sonographer.

Common bile duct:

Diameter: 4-5 mm.

Liver:

Heterogeneous liver. Many of the liver lesions on prior
contrast-enhanced CT are not well seen by ultrasound. There is a
x 1.5 x 1.5 cm complex hypoechoic lesion in the left lobe. No
convincing subcapsular collection is suggested on CT. Portal vein is
patent on color Doppler imaging with normal direction of blood flow
towards the liver.

Other: No right upper quadrant ascites.
IMPRESSION: 1. Gallbladder sludge. No gallstones or sonographic findings of
acute cholecystitis.
2. No biliary dilatation.
3. Heterogeneous liver with 1.8 cm hypoechoic lesion in the left
lobe of the liver. Many of the additional liver lesions on prior
contrast-enhanced CT are not well seen by ultrasound.
4. No sonographic evidence of subcapsular collection is suggested on
CT.

## 2020-06-27 MED ORDER — DEXTROSE IN LACTATED RINGERS 5 % IV SOLN
INTRAVENOUS | Status: DC
  Administered 2020-07-06: 990 mL via INTRAVENOUS

## 2020-06-27 MED ORDER — MAGIC MOUTHWASH W/LIDOCAINE
5.0000 mL | Freq: Four times a day (QID) | ORAL | Status: DC | PRN
  Administered 2020-06-27: 5 mL via ORAL
  Filled 2020-06-27 (×3): qty 5

## 2020-06-27 MED ORDER — CABOZANTINIB S-MALATE 20 MG PO TABS: 20.0000 mg | ORAL_TABLET | Freq: Every day | ORAL | 1 refills | Status: DC

## 2020-06-27 MED ORDER — TACROLIMUS ER 1 MG PO TB24
4.0000 mg | ORAL_TABLET | Freq: Every day | ORAL | Status: DC
  Administered 2020-06-27 – 2020-07-11 (×13): 4 mg via ORAL
  Filled 2020-06-27: qty 4

## 2020-06-27 MED ORDER — LIP MEDEX EX OINT
TOPICAL_OINTMENT | CUTANEOUS | Status: AC
  Filled 2020-06-27: qty 7

## 2020-06-27 MED ORDER — PREDNISONE 5 MG PO TABS
5.0000 mg | ORAL_TABLET | Freq: Every day | ORAL | Status: DC
  Administered 2020-06-28 – 2020-07-11 (×13): 5 mg via ORAL
  Filled 2020-06-27 (×13): qty 1

## 2020-06-27 MED ORDER — SODIUM CHLORIDE 0.9 % IV SOLN: 2.0000 g | Freq: Three times a day (TID) | INTRAVENOUS | Status: DC

## 2020-06-27 MED FILL — CABOMETYX 20 MG TABLET: 20 | 30 days supply | Qty: 30 | Fill #0

## 2020-06-27 NOTE — Progress Notes (Addendum)
PROGRESS NOTE    Candace Griffin  IWL:[id number] DOB: 12-23-1975 DOA: 06/26/2020 PCP: Richarda Osmond, DO    Brief Narrative:  Candace Griffin was admitted to the hospital with a working diagnosis of systemic inflammatory response syndrome, possible pancreatitis/ acute hepatitis, (ruled out sepsis).  44 year old female with a past medical history for renal failure status post kidney transplant, newly diagnosed metastatic carcinoma, unknown primary.  Presents with 4 days of intractable nausea, vomiting, abdominal pain, not able to tolerate liquids or oral medications.  On her initial physical examination she was hypotensive, after fluid resuscitation her blood pressure was 108/87, heart rate 136, respiratory rate 18, oxygen saturation 100% on room air.  She had dry mucous membranes, her lungs were clear to auscultation bilaterally, heart S1-S2, present, tachycardic, abdomen mildly distended, tender in the mid abdomen, no rebound or guarding, no lower extremity edema, no rashes.  Sodium 138, potassium 5.2, chloride 97, bicarb 17, glucose 168, BUN 38, creatinine 1.42, anion gap 24, alkaline phosphatase 646, lipase 56, AST 1461, ALT 586, lactic acid 10.3, white count 25.1, hemoglobin 11.9, hematocrit 39.4, platelets 205. CT of the abdomen with hepatic lesions.  Small subcapsular hematoma inferiorly from recent liver biopsy.  Bulky retroperitoneal and bilateral inguinal adenopathy.  Transplant kidney in the left lower quadrant with unchanged fullness of the renal collecting system. Pancreas with no ductal dilatation or inflammation. Chest radiograph with no infiltrates.   Assessment & Plan:   Principal Problem:   Pancreatitis Active Problems:   GERD   Status post kidney transplant   Hypertension   Symptomatic anemia   Metastatic carcinoma to liver (HCC)   AKI (acute kidney injury) ([place])   Hepatitis   1.  Severe systemic inflammatory response syndrome, suspect pancreatitis, (sepsis  ruled out). Significant improvement in abdominal pain, nausea and vomiting, tolerating well clear liquids. Wbc is down to 19,7, lipase is down to 43. Blood cultures with no growth. Blood pressure 174 systolic and HR 081.   Considering improvement will discontinue antibiotic therapy and continue monitoring cell count and cultures.   Supportive medical therapy with IV pantoprazole, as needed analgesics and antiemetics.   2.  Acute kidney injury likely prerenal/anion gap metabolic acidosis/lactic acidosis. Hyperkalemia.  renal function with serum cr down to 1,07, K 5,1 and serum bicarbonate at 19.    Change IV fluids to dextrose and LR at 50 ml per H for the next 24 and follow up on renal function in am. Avoid hypotension and nephrotoxic medications.   3.  Acute Hepatitis/ Elevated liver enzymes/metastatic carcinoma of unknown primary. Continue elevation in AST up to 1,732, ALT 659, T. Bilirubin is down to 4,4. Patient with positive icterus but not encephalopathy.   Further work up with liver US, acute viral hepatitis panel, and acetaminophen level.  Hold on statin therapy.  Continue supportive medical therapy.   Continue to hold on cabozantinib, medication that can produce liver toxicity including elevated liver enzymes and bilirubins.   4.  Status post kidney transplant.  Decrease systemic steroids to patient's home dose of prednisone.   Continue mycophenolate and tacrolimus.  5. Anxiety. On lorazepam.    Patient continue to be at high risk for worsening pancreatitis and hepatitis.   Transfer to telemetry.   Status is: Inpatient  Remains inpatient appropriate because:IV treatments appropriate due to intensity of illness or inability to take PO   Dispo: The patient is from: Home              Anticipated  d/c is to: Home              Anticipated d/c date is: 3 days              Patient currently is not medically stable to d/c.   DVT prophylaxis: Enoxaparin   Code Status:      full  Family Communication:  No family at the bedside       Consultants:   Oncology   Subjective: Patient is feeling better, nausea and vomiting have improved along with abdominal pain, not yet back to baseline.   Objective: Vitals:   06/27/20 0300 06/27/20 0400 06/27/20 0500 06/27/20 0600  BP:    115/80  Pulse: (!) 126 (!) 124 (!) 122 (!) 123  Resp: (!) 21 20 18 20   Temp:   99.5 F (37.5 C)   TempSrc:   Oral   SpO2: 98% 98% 98% 99%  Weight:      Height:        Intake/Output Summary (Last 24 hours) at 06/27/2020 0830 Last data filed at 06/27/2020 2353 Gross per 24 hour  Intake 4120.85 ml  Output 25 ml  Net 4095.85 ml   Filed Weights   06/26/20 1815  Weight: 57.3 kg    Examination:   General: deconditioned  Neurology: Awake and alert, non focal  E ENT: mild pallor and icterus, oral mucosa moist Cardiovascular: No JVD. S1-S2 present, rhythmic, no gallops, rubs, or murmurs. No lower extremity edema. Pulmonary:  Positive breath sounds bilaterally, adequate air movement, no wheezing, rhonchi or rales. Gastrointestinal. Abdomen mild distended, non tender to deep palpation, no rebound and guarding. Skin. No rashes Musculoskeletal: no joint deformities     Data Reviewed: I have personally reviewed following labs and imaging studies  CBC: Recent Labs  Lab 06/26/20 1031 06/26/20 1153 06/27/20 0253  WBC 22.2* 25.1* 19.7*  NEUTROABS 18.7* 21.5*  --   HGB 12.1 11.9* 9.2*  HCT 38.7 39.4 30.2*  MCV 79.6* 85.8 85.1  PLT 205 205 614   Basic Metabolic Panel: Recent Labs  Lab 06/26/20 1031 06/26/20 1153 06/27/20 0253  NA 140 138 139  K 4.7 5.2* 5.1  CL 98 97* 108  CO2 20* 17* 19*  GLUCOSE 175* 168* 181*  BUN 35* 38* 29*  CREATININE 1.38* 1.42* 1.07*  CALCIUM 10.2 9.5 8.0*  MG 2.2  --   --    GFR: Estimated Creatinine Clearance: 61.3 mL/min (A) (by C-G formula based on SCr of 1.07 mg/dL (H)). Liver Function Tests: Recent Labs  Lab 06/26/20 1031  06/26/20 1153 06/27/20 0253  AST 1,687* 1,461* 1,732*  ALT 665* 586* 659*  ALKPHOS 767* 646* 463*  BILITOT 5.1* 5.5* 4.4*  PROT 7.9 7.5 5.9*  ALBUMIN 2.9* 3.1* 2.4*   Recent Labs  Lab 06/26/20 1153 06/27/20 0253  LIPASE 56* 43   No results for input(s): AMMONIA in the last 168 hours. Coagulation Profile: No results for input(s): INR, PROTIME in the last 168 hours. Cardiac Enzymes: No results for input(s): CKTOTAL, CKMB, CKMBINDEX, TROPONINI in the last 168 hours. BNP (last 3 results) No results for input(s): PROBNP in the last 8760 hours. HbA1C: No results for input(s): HGBA1C in the last 72 hours. CBG: No results for input(s): GLUCAP in the last 168 hours. Lipid Profile: No results for input(s): CHOL, HDL, LDLCALC, TRIG, CHOLHDL, LDLDIRECT in the last 72 hours. Thyroid Function Tests: Recent Labs    06/26/20 1032  TSH 2.569  FREET4 1.45*   Anemia  Panel: No results for input(s): VITAMINB12, FOLATE, FERRITIN, TIBC, IRON, RETICCTPCT in the last 72 hours.    Radiology Studies: I have reviewed all of the imaging during this hospital visit personally     Scheduled Meds: . Chlorhexidine Gluconate Cloth  6 each Topical Daily  . enoxaparin (LOVENOX) injection  40 mg Subcutaneous QHS  . hydrocortisone sod succinate (SOLU-CORTEF) inj  100 mg Intravenous Q8H  . mouth rinse  15 mL Mouth Rinse BID  . mycophenolate  500 mg Oral BID  . pantoprazole (PROTONIX) IV  40 mg Intravenous Q12H  . pravastatin  20 mg Oral QHS  . Tacrolimus ER  4 mg Oral Daily   Continuous Infusions: . sodium chloride Stopped (06/27/20 0516)  . ceFEPime (MAXIPIME) IV    . vancomycin Stopped (06/27/20 4458)     LOS: 1 day        Jayion Schneck Gerome Apley, MD

## 2020-06-27 NOTE — TOC Initial Note (Signed)
Transition of Care [facility]) - Initial/Assessment Note    Patient Details  Name: Candace Griffin MRN: [id number] Date of Birth: Nov 06, 1975  [facility] CM/SW Contact:    Leeroy Cha, RN Phone Number: 06/27/2020, 7:27 AM  Clinical Narrative:                  44 y.o. female with medical history significant of renal failure due to chronic glomerulonephritis, status post kidney transplant, GERD, hypertension and recently diagnosed metastatic carcinoma of unknown primary.  For the last 4 days patient has been experiencing abdominal pain, dull in nature, moderate to severe in intensity, in the center of the abdomen, no improving or worsening factors, it has been associated with persistent nausea, vomiting and generalized weakness. She is not able to tolerate liquids or ice, she has been not able to tolerate her home medications including analgesics and immunosuppressants.  Patient was seen at Dr. Irene Limbo office today, she was noted to be acutely ill, she received Phenergan and hydromorphone and was transferred to the emergency room.  October 4 patient underwent left inguinal node needle core biopsy and liver needle core biopsy, both resulted in metastatic carcinoma.  Waiting for further results before starting therapy.  ED Course: Patient was found critically ill, hypotensive 86/68, tachycardic 152, respiratory 22.  Patient received antibiotic therapy with cefepime, vancomycin and metronidazole, 3 L of IV fluids, and referred for further hospitalization. plan is to return to home with self care Following for progression.  Expected Discharge Plan: Home/Self Care Barriers to Discharge: Barriers Unresolved (comment) (pancreatitis and iv meds)   Patient Goals and CMS Choice Patient states their goals for this hospitalization and ongoing recovery are:: to go home CMS Medicare.gov Compare Post Acute Care list provided to:: Patient    Expected Discharge Plan and  Services Expected Discharge Plan: Home/Self Care   Discharge Planning Services: CM Consult   Living arrangements for the past 2 months: Single Family Home                                      Prior Living Arrangements/Services Living arrangements for the past 2 months: Single Family Home Lives with:: Self Patient language and need for interpreter reviewed:: Yes Do you feel safe going back to the place where you live?: Yes      Need for Family Participation in Patient Care: Yes (Comment) Care giver support system in place?: Yes (comment)   Criminal Activity/Legal Involvement Pertinent to Current Situation/Hospitalization: No - Comment as needed  Activities of Daily Living      Permission Sought/Granted                  Emotional Assessment Appearance:: Appears stated age Attitude/Demeanor/Rapport: Engaged Affect (typically observed): Calm Orientation: : Oriented to Place, Oriented to Self, Oriented to  Time, Oriented to Situation Alcohol / Substance Use: Not Applicable Psych Involvement: No (comment)  Admission diagnosis:  Generalized abdominal pain [R10.84] Sepsis ([place]) [A41.9] Severe sepsis ([place]) [A41.9, R65.20] Non-intractable vomiting with nausea, unspecified vomiting type [R11.2] Metastatic malignant neoplasm, unspecified site [facility]) [C79.9] Patient Active Problem List   Diagnosis Date Noted  . Sepsis ([place]) 06/26/2020  . AKI (acute kidney injury) ([place]) 06/26/2020  . Pancreatitis 06/26/2020  . 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 Counseling regarding advance care planning and goals of care 06/10/2020  .  Metastatic carcinoma to liver ([place])   . Renal mass   . Lymphadenopathy   . Anemia   . Feeling unwell 03/28/2020  . Constipation 02/21/2020  . Left arm pain 11/16/2019  . Symptomatic anemia 12/03/2017  . Low grade squamous intraepithelial lesion (LGSIL) on cervical Pap smear 05/03/2014   . Genital warts 07/31/2013  . Hypertension 04/21/2012  . Status post kidney transplant 01/07/2012  . Menorrhagia 09/30/2011  . GERD 10/10/2009  . GENITAL HERPES, HX OF 08/11/2007   PCP:  Richarda Osmond, DO Pharmacy:   [id number] - [place], [place] [address] [place] [address] Phone: [phone] Fax: [phone]     Social Determinants of Health (SDOH) Interventions    Readmission Risk Interventions No flowsheet data found.

## 2020-06-27 NOTE — Progress Notes (Signed)
Pharmacy Antibiotic Note  Candace Griffin is a 44 y.o. female with hx kidney transplant and recently diagnosed with metastatic carcinoma of unknown primary on chemotherapy PTA (last treatment on 10/21), presented to the ED from the [facility] on 06/26/2020 after she was found slumped in her wheelchair with c/o abdominal pain and n/v.  Pharmacy is consulted to start vancomycin and cefepime for sepsis.  D2 Vanc/Cefepime WBC down Afebrile SCr improved  Plan:  With improved SCr, change cefepime from 2g IV q12 to 2g IV q8  Continue vanc 500mg  IV q12 for now  With MRSA PCR negative, would suggest discontinuing the vancomycin  ______________________________________________  Temp (24hrs), Avg:98.4 F (36.9 C), Min:97.4 F (36.3 C), Max:99.5 F (37.5 C)  Recent Labs  Lab 06/26/20 1031 06/26/20 1153 06/26/20 1433 06/27/20 0253  WBC 22.2* 25.1*  --  19.7*  CREATININE 1.38* 1.42*  --  1.07*  LATICACIDVEN  --  10.3* 6.1*  --     Estimated Creatinine Clearance: 61.3 mL/min (A) (by C-G formula based on SCr of 1.07 mg/dL (H)).    Allergies  Allergen Reactions  . Lisinopril Cough  . Tape Rash and Other (See Comments)    Causes welts also     Thank you for allowing pharmacy to be a part of this patient's care.  Kara Mead 06/27/2020 8:19 AM

## 2020-06-27 NOTE — Progress Notes (Addendum)
HEMATOLOGY-ONCOLOGY PROGRESS NOTE  SUBJECTIVE: The patient reports that she is feeling much better this morning.  She tells me that she was having nausea and vomiting at home for about the past week.  She denies nausea and vomiting this morning.  She states that she feels her mouth is "sore."  Denies chest pain or shortness of breath.  Denies abdominal pain.  Oncology History  Metastatic carcinoma to liver ([place])  05/30/2020 Initial Diagnosis   Metastatic carcinoma to liver ([place])   06/13/2020 -  Chemotherapy   The patient had dexamethasone (DECADRON) 4 MG tablet, 8 mg, Oral, Daily, 1 of 1 cycle, Start date: 06/10/2020, End date: -- palonosetron (ALOXI) injection 0.25 mg, 0.25 mg, Intravenous,  Once, 1 of 4 cycles Administration: 0.25 mg (06/13/2020) pegfilgrastim-jmdb (FULPHILA) injection 6 mg, 6 mg, Subcutaneous,  Once, 1 of 4 cycles Administration: 6 mg (06/15/2020) CARBOplatin (PARAPLATIN) 390 mg in sodium chloride 0.9 % 250 mL chemo infusion, 390 mg (78.9 % of original dose 491.5 mg), Intravenous,  Once, 1 of 4 cycles Dose modification:   (original dose 491.5 mg, Cycle 1) Administration: 390 mg (06/13/2020) fosaprepitant (EMEND) 150 mg in sodium chloride 0.9 % 145 mL IVPB, 150 mg, Intravenous,  Once, 1 of 4 cycles Administration: 150 mg (06/13/2020) PACLitaxel (TAXOL) 54 mg in sodium chloride 0.9 % 150 mL chemo infusion (> 80mg /m2), 30 mg/m2 = 54 mg (60 % of original dose 50 mg/m2), Intravenous,  Once, 1 of 4 cycles Dose modification: 50 mg/m2 (original dose 50 mg/m2, Cycle 1, Reason: Change in LFTs), 30 mg/m2 (original dose 50 mg/m2, Cycle 1, Reason: Change in LFTs) Administration: 54 mg (06/13/2020)  for chemotherapy treatment.    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 with unknown primary site [facility])  06/10/2020 Initial Diagnosis   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 with unknown primary site [facility])   06/13/2020 -  Chemotherapy   The patient had  dexamethasone (DECADRON) 4 MG tablet, 8 mg, Oral, Daily, 1 of 1 cycle, Start date: 06/10/2020, End date: -- palonosetron (ALOXI) injection 0.25 mg, 0.25 mg, Intravenous,  Once, 1 of 4 cycles Administration: 0.25 mg (06/13/2020) pegfilgrastim-jmdb (FULPHILA) injection 6 mg, 6 mg, Subcutaneous,  Once, 1 of 4 cycles Administration: 6 mg (06/15/2020) CARBOplatin (PARAPLATIN) 390 mg in sodium chloride 0.9 % 250 mL chemo infusion, 390 mg (78.9 % of original dose 491.5 mg), Intravenous,  Once, 1 of 4 cycles Dose modification:   (original dose 491.5 mg, Cycle 1) Administration: 390 mg (06/13/2020) fosaprepitant (EMEND) 150 mg in sodium chloride 0.9 % 145 mL IVPB, 150 mg, Intravenous,  Once, 1 of 4 cycles Administration: 150 mg (06/13/2020) PACLitaxel (TAXOL) 54 mg in sodium chloride 0.9 % 150 mL chemo infusion (> 80mg /m2), 30 mg/m2 = 54 mg (60 % of original dose 50 mg/m2), Intravenous,  Once, 1 of 4 cycles Dose modification: 50 mg/m2 (original dose 50 mg/m2, Cycle 1, Reason: Change in LFTs), 30 mg/m2 (original dose 50 mg/m2, Cycle 1, Reason: Change in LFTs) Administration: 54 mg (06/13/2020)  for chemotherapy treatment.       REVIEW OF SYSTEMS:   Constitutional: Denies fevers, chills Ears, nose, mouth, throat, and face: Reports a sore mouth. Respiratory: Denies cough, dyspnea or wheezes Cardiovascular: Denies palpitation, chest discomfort Gastrointestinal: The patient has had nausea and vomiting for about 1 week prior to admission, none today, denies abdominal pain Skin: Denies abnormal skin rashes Lymphatics: Denies new lymphadenopathy or easy bruising Neurological:Denies numbness, tingling or new weaknesses Behavioral/Psych: Mood  is stable, no new changes  Extremities: No lower extremity edema All other systems were reviewed with the patient and are negative.  I have reviewed the past medical history, past surgical history, social history and family history with the patient and they are  unchanged from previous note.   PHYSICAL EXAMINATION: ECOG PERFORMANCE STATUS: 2 - Symptomatic, <50% confined to bed  Vitals:   06/27/20 0500 06/27/20 0600  BP:  115/80  Pulse: (!) 122 (!) 123  Resp: 18 20  Temp: 99.5 F (37.5 C)   SpO2: 98% 99%   Filed Weights   06/26/20 1815  Weight: 57.3 kg    Intake/Output from previous day: 11/03 0701 - 11/04 0700 In: 4120.9 [I.V.:1525.3; IV Piggyback:2595.5] Out: 25 [Emesis/NG output:25]  GENERAL:alert, no distress and comfortable SKIN: skin color, texture, turgor are normal, no rashes or significant lesions EYES: Scleral icterus present OROPHARYNX: Mucositis noted in the posterior pharynx LUNGS: clear to auscultation and percussion with normal breathing effort HEART: regular rate & rhythm and no murmurs and no lower extremity edema ABDOMEN:abdomen soft, non-tender and normal bowel sounds Musculoskeletal:no cyanosis of digits and no clubbing  NEURO: alert & oriented x 3 with fluent speech, no focal motor/sensory deficits  LABORATORY DATA:  I have reviewed the data as listed CMP Latest Ref Rng & Units 06/27/2020 06/26/2020 06/26/2020  Glucose 70 - 99 mg/dL 181(H) 168(H) 175(H)  BUN 6 - 20 mg/dL 29(H) 38(H) 35(H)  Creatinine 0.44 - 1.00 mg/dL 1.07(H) 1.42(H) 1.38(H)  Sodium 135 - 145 mmol/L 139 138 140  Potassium 3.5 - 5.1 mmol/L 5.1 5.2(H) 4.7  Chloride 98 - 111 mmol/L 108 97(L) 98  CO2 22 - 32 mmol/L 19(L) 17(L) 20(L)  Calcium 8.9 - 10.3 mg/dL 8.0(L) 9.5 10.2  Total Protein 6.5 - 8.1 g/dL 5.9(L) 7.5 7.9  Total Bilirubin 0.3 - 1.2 mg/dL 4.4(H) 5.5(H) 5.1(HH)  Alkaline Phos 38 - 126 U/L 463(H) 646(H) 767(H)  AST 15 - 41 U/L 1,732(H) 1,461(H) 1,687(HH)  ALT 0 - 44 U/L 659(H) 586(H) 665(HH)    Lab Results  Component Value Date   WBC 19.7 (H) 06/27/2020   HGB 9.2 (L) 06/27/2020   HCT 30.2 (L) 06/27/2020   MCV 85.1 06/27/2020   PLT 154 06/27/2020   NEUTROABS 21.5 (H) 06/26/2020    CT ABDOMEN PELVIS WO CONTRAST  Result  Date: 06/26/2020 CLINICAL DATA:  Diffuse abdominal pain and vomiting. History of cancer and renal transplant. Liver mass post recent liver biopsy. EXAM: CT ABDOMEN AND PELVIS WITHOUT CONTRAST TECHNIQUE: Multidetector CT imaging of the abdomen and pelvis was performed following the standard protocol without IV contrast. COMPARISON:  Contrast-enhanced exam 05/27/2020. FINDINGS: Lower chest: No focal airspace disease, pleural fluid, or pulmonary nodularity. Heart is normal in size. Hepatobiliary: Patient's known hepatic lesions are not well demonstrated on noncontrast exam. Liver parenchyma is heterogeneous. There may be a small subcapsular hematoma inferiorly from recent liver biopsy, not well-defined on this noncontrast exam. The gallbladder is present. There is high-density material in the gallbladder lumen, possibly sludge. No calcified gallstone. Common bile duct is poorly defined, but no evidence of biliary dilatation. Pancreas: No ductal dilatation or inflammation. Spleen: Normal in size without focal abnormality. Adrenals/Urinary Tract: Normal adrenal glands. Chronic bilateral native renal atrophy. Solid-appearing lesion in the lower left renal hilum was better appreciated on prior contrast-enhanced CT, grossly unchanged, series 2, image 35. Transplant kidney in the left lower quadrant. Scarring in the left lower pole is better appreciated on prior contrast-enhanced exam. Similar  fullness of the renal collecting system without frank hydronephrosis. No transplant perinephric edema. Sequela of failed transplant in the right iliac fossa. The urinary bladder is unremarkable. Stomach/Bowel: Bowel evaluation is limited in the absence of enteric contrast. There is no bowel obstruction or evidence of bowel inflammation. The appendix is normal. Vascular/Lymphatic: Bulky retroperitoneal adenopathy, some of which is low-density and typical of necrosis. Occasional areas of nodal calcification. Adenopathy extends from the  retrocrural space to the bilateral common iliac arteries, and left external iliac station. There also enlarged bilateral inguinal lymph nodes, left greater than right. Index aortocaval node measures 4.1 x 2.4 cm, previously 4.2 x 2.6 cm. Overall adenopathy is not significantly changed in the interim allowing for noncontrast technique. There is aortic and branch atherosclerosis. Reproductive: Bulky uterus with fibroids.  No obvious adnexal mass. Other: There is trace free fluid in the pelvis, slightly increased from prior exam. No other free fluid or ascites. No free air. Musculoskeletal: Ill-defined sclerosis involving right L2 vertebral body, nonspecific but unchanged from previous. Sclerotic density in the left acetabulum. There is hemi transitional lumbosacral anatomy. No fracture or acute osseous abnormality. IMPRESSION: 1. Patient's known hepatic lesions are not well-defined on this noncontrast exam. There may be a small subcapsular hematoma inferiorly from recent liver biopsy, not well-defined. No evidence of hemoperitoneum. 2. Bulky retroperitoneal and bilateral inguinal adenopathy, grossly unchanged from prior. 3. Transplant kidney in the left lower quadrant with unchanged fullness of the renal collecting system. Sequela of failed transplant in the right iliac fossa. Stable soft tissue density in the region of the native left kidney lower hilum. 4. Bulky uterus with fibroids. 5. Nonspecific sclerosis involving the right aspect of L2 vertebral body. Aortic Atherosclerosis (ICD10-I70.0). Electronically Signed   By: Keith Rake M.D.   On: 06/26/2020 16:45   US BIOPSY (LIVER)  Result Date: 05/31/2020 INDICATION: 44 year old female with widespread nodal and hepatic metastatic disease of uncertain etiology. She does have a solid endophytic mass in the left renal hilum. Patient recently underwent ultrasound-guided core biopsy of a metastatic left inguinal lymph node. Biopsy specimens were diagnostic for  carcinoma but nonspecific as to tissue source. Therefore, patient presents for repeat biopsy of 1 of the hepatic lesions. EXAM: ULTRASOUND BIOPSY CORE LIVER MEDICATIONS: None. ANESTHESIA/SEDATION: Moderate (conscious) sedation was employed during this procedure. A total of Versed 2 mg and Fentanyl 100 mcg was administered intravenously. Moderate Sedation Time: 9 minutes. The patient's level of consciousness and vital signs were monitored continuously by radiology nursing throughout the procedure under my direct supervision. FLUOROSCOPY TIME:  None COMPLICATIONS: None immediate. PROCEDURE: Informed written consent was obtained from the patient after a thorough discussion of the procedural risks, benefits and alternatives. All questions were addressed. Maximal Sterile Barrier Technique was utilized including caps, mask, sterile gowns, sterile gloves, sterile drape, hand hygiene and skin antiseptic. A timeout was performed prior to the initiation of the procedure. The liver was interrogated with ultrasound. A well-defined hepatic lesion was successfully identified. A suitable skin entry site was selected and marked. The overlying skin was sterilely prepped and draped in the standard fashion using chlorhexidine skin prep. Local anesthesia was attained by infiltration with 1% lidocaine. A small dermatotomy was made. Under real-time ultrasound guidance, a 17 gauge introducer needle was advanced through the liver and positioned at the margin of the mass. Multiple 18 gauge core biopsies were then obtained coaxially using a bio Pince automated biopsy device. Biopsy specimens were placed in formalin and delivered to pathology for further  analysis. As the introducer needle was removed, the biopsy tract was embolized with a Gel-Foam slurry. The patient tolerated the procedure well. IMPRESSION: Technically successful ultrasound-guided core biopsy of liver lesion. Signed, Criselda Peaches, MD, [facility] Radiology Electronically Signed   By: Jacqulynn Cadet M.D.   On: 05/31/2020 16:48   DG Chest Port 1 View  Result Date: 06/26/2020 CLINICAL DATA:  Weakness EXAM: PORTABLE CHEST 1 VIEW COMPARISON:  05/26/2020 FINDINGS: The heart size and mediastinal contours are within normal limits. Both lungs are clear. No pleural effusion or pneumothorax. The visualized skeletal structures are unremarkable. IMPRESSION: No acute process in the chest. Electronically Signed   By: Macy Mis M.D.   On: 06/26/2020 13:45   US PELVIC COMPLETE WITH TRANSVAGINAL  Result Date: 05/30/2020 CLINICAL DATA:  Metastatic cancer unknown primary EXAM: TRANSABDOMINAL AND TRANSVAGINAL ULTRASOUND OF PELVIS TECHNIQUE: Both transabdominal and transvaginal ultrasound examinations of the pelvis were performed. Transabdominal technique was performed for global imaging of the pelvis including uterus, ovaries, adnexal regions, and pelvic cul-de-sac. It was necessary to proceed with endovaginal exam following the transabdominal exam to visualize the uterus endometrium ovaries. COMPARISON:  CT 05/27/2020 FINDINGS: Uterus Measurements: 10.1 x 6.1 x 5.4 cm = volume: 172.5 mL. Nabothian cysts in the cervix. Left posterior myometrial mass measuring 1.4 x 1.8 x 1.1 cm. Right anterior intramural fundal mass measuring 1.6 x 2.1 x 1.6 cm. Endometrium Thickness: 6 mm.  No focal abnormality visualized. Right ovary Measurements: 1.5 x 1.8 x 2.1 cm = volume: 3.1 mL. Normal appearance/no adnexal mass. Left ovary Measurements: 1.4 x 1.8 x 1.8 cm = volume: 2.4 mL. Normal appearance/no adnexal mass. Other findings Moderate free fluid in the pelvis. IMPRESSION: 1. Negative for ovarian mass lesion. 2. Uterine fibroids 3. Moderate free fluid in the pelvis Electronically Signed   By: Donavan Foil M.D.   On: 05/30/2020 21:51   US Abdomen Limited RUQ  Result Date: 06/01/2020 CLINICAL DATA:  Liver mass.  Recent biopsy.  Pain post  biopsy EXAM: ULTRASOUND ABDOMEN LIMITED RIGHT UPPER QUADRANT COMPARISON:  05/31/2020 FINDINGS: Gallbladder: There is gallbladder sludge without sonographic evidence for acute cholecystitis. There is no gallbladder wall thickening. The sonographic Percell Miller sign is negative. Common bile duct: Diameter: 4 mm Liver: Multiple hepatic lesions are again noted phi, better appreciated on the patient's prior CT. Portal vein is patent on color Doppler imaging with normal direction of blood flow towards the liver. Other: There is a trace amount of perihepatic free fluid. IMPRESSION: Trace perihepatic free fluid. Otherwise, stable exam with multiple hepatic lesions again identified. Electronically Signed   By: Constance Holster M.D.   On: 06/01/2020 03:42    ASSESSMENT: 44 yo with   1) Metastatic carcinoma of unknown primary Patient with h/o renal transplantation on tacrolimus, prednisone and cellcept chronic immunosuppression with extensive metastatic disease with extensive abd/pelvic LNadenopathy and hepatic metastases.  IHC from LN and liver metastases -- non specific morphology and IC ? Gyn vs renal tubular origin. Tumor markers unrevealing Component     Latest Ref Rng & Units 05/29/2020 05/31/2020  CEA     0.0 - 4.7 ng/mL 0.9   AFP, Serum, Tumor Marker     0.0 - 8.3 ng/mL 1.7   CA 19-9     0 - 35 U/mL 24   Cancer Antigen (CA) 125     0.0 - 38.1 U/mL 20.1   hCG Quant     mIU/mL 3  LDH     98 - 192 U/L  178    2) severe systemic inflammatory response syndrome  3) transaminitis and hyperbilirubinemia  4) AKI with hyperkalemia, improved    PLAN: -Cancer-ID was sent and results indicate cancer is likely renal in origin.  We plan to change chemotherapy to Cabometyx at a reduced dose of 20 mg daily.  We will start this only once sepsis is ruled out.   -I have reached out to our oral chemotherapy pharmacist and this medication has been ready for the patient.  We will need to determine  where we can obtain it and bring to the hospital once we decide to proceed with this medication. -CT imaging has been reviewed and transaminitis and hyperbilirubinemia likely due to liver metastases.  Plan is to get her started on treatment for renal cell carcinoma soon as possible. -Recommend close monitoring of her hepatic and renal function. -Recommend nephrology consultation for management of her immunosuppressive drugs.  May need dose adjustment due to her worsening hepatic function.   LOS: 1 day   Mikey Bussing, DNP, AGPCNP-BC, AOCNP 06/27/20   ADDENDUM  .Patient was Personally and independently interviewed, examined and relevant elements of the history of present illness were reviewed in details and an assessment and plan was created. All elements of the patient's history of present illness , assessment and plan were discussed in details with Mikey Bussing, DNP, AGPCNP-BC, AOCNP. The above documentation reflects our combined findings assessment and plan. patient's symptoms better control. Still with TTP non focally over abd. [place] abd - ordered -pending. On liq diet. Nausea controlled. Appears more awake. On EMpiric ABx per hospital medicine -- cultures unrevealing thus far. Abnormal LFTs from liver mets, worsening from ?hypotension/sepsis/transplant meds. Will continue to f/u-- anticipate start Cabometyx @ 20mg  po daily in the next few days if no sepsis and liver enz relatively stable. -nephrology consultation for dose adjustment of transplant medications as needed in setting of sepsis/abnormal LFTs  Sullivan Lone MD MS

## 2020-06-28 ENCOUNTER — Inpatient Hospital Stay (HOSPITAL_COMMUNITY): Payer: Medicare Other

## 2020-06-28 DIAGNOSIS — C649 Malignant neoplasm of unspecified kidney, except renal pelvis: Secondary | ICD-10-CM | POA: Diagnosis not present

## 2020-06-28 DIAGNOSIS — R1084 Generalized abdominal pain: Secondary | ICD-10-CM

## 2020-06-28 DIAGNOSIS — K759 Inflammatory liver disease, unspecified: Secondary | ICD-10-CM

## 2020-06-28 DIAGNOSIS — N179 Acute kidney failure, unspecified: Secondary | ICD-10-CM | POA: Diagnosis not present

## 2020-06-28 DIAGNOSIS — K859 Acute pancreatitis without necrosis or infection, unspecified: Secondary | ICD-10-CM | POA: Diagnosis not present

## 2020-06-28 DIAGNOSIS — K219 Gastro-esophageal reflux disease without esophagitis: Secondary | ICD-10-CM | POA: Diagnosis not present

## 2020-06-28 DIAGNOSIS — C787 Secondary malignant neoplasm of liver and intrahepatic bile duct: Secondary | ICD-10-CM | POA: Diagnosis not present

## 2020-06-28 LAB — COMPREHENSIVE METABOLIC PANEL
ALT: 594 U/L — ABNORMAL HIGH (ref 0–44)
AST: 1129 U/L — ABNORMAL HIGH (ref 15–41)
Albumin: 2.7 g/dL — ABNORMAL LOW (ref 3.5–5.0)
Alkaline Phosphatase: 514 U/L — ABNORMAL HIGH (ref 38–126)
Anion gap: 12 (ref 5–15)
BUN: 27 mg/dL — ABNORMAL HIGH (ref 6–20)
CO2: 20 mmol/L — ABNORMAL LOW (ref 22–32)
Calcium: 8.5 mg/dL — ABNORMAL LOW (ref 8.9–10.3)
Chloride: 106 mmol/L (ref 98–111)
Creatinine, Ser: 1.24 mg/dL — ABNORMAL HIGH (ref 0.44–1.00)
GFR, Estimated: 55 mL/min — ABNORMAL LOW (ref >60)
Glucose, Bld: 133 mg/dL — ABNORMAL HIGH (ref 70–99)
Potassium: 3.6 mmol/L (ref 3.5–5.1)
Sodium: 138 mmol/L (ref 135–145)
Total Bilirubin: 4.6 mg/dL — ABNORMAL HIGH (ref 0.3–1.2)
Total Protein: 6.3 g/dL — ABNORMAL LOW (ref 6.5–8.1)

## 2020-06-28 LAB — CBC WITH DIFFERENTIAL/PLATELET
Abs Immature Granulocytes: 1.36 10*3/uL — ABNORMAL HIGH (ref 0.00–0.07)
Basophils Absolute: 0.2 10*3/uL — ABNORMAL HIGH (ref 0.0–0.1)
Basophils Relative: 1 %
Eosinophils Absolute: 0 10*3/uL (ref 0.0–0.5)
Eosinophils Relative: 0 %
HCT: 31.3 % — ABNORMAL LOW (ref 36.0–46.0)
Hemoglobin: 9.6 g/dL — ABNORMAL LOW (ref 12.0–15.0)
Immature Granulocytes: 4 %
Lymphocytes Relative: 4 %
Lymphs Abs: 1.3 10*3/uL (ref 0.7–4.0)
MCH: 25.7 pg — ABNORMAL LOW (ref 26.0–34.0)
MCHC: 30.7 g/dL (ref 30.0–36.0)
MCV: 83.7 fL (ref 80.0–100.0)
Monocytes Absolute: 1.2 10*3/uL — ABNORMAL HIGH (ref 0.1–1.0)
Monocytes Relative: 3 %
Neutro Abs: 30.9 10*3/uL — ABNORMAL HIGH (ref 1.7–7.7)
Neutrophils Relative %: 88 %
Platelets: 187 10*3/uL (ref 150–400)
RBC: 3.74 MIL/uL — ABNORMAL LOW (ref 3.87–5.11)
RDW: 23.4 % — ABNORMAL HIGH (ref 11.5–15.5)
WBC: 35 10*3/uL — ABNORMAL HIGH (ref 4.0–10.5)
nRBC: 3.7 % — ABNORMAL HIGH (ref 0.0–0.2)

## 2020-06-28 LAB — LACTIC ACID, PLASMA
Lactic Acid, Venous: 2.3 mmol/L (ref 0.5–1.9)
Lactic Acid, Venous: 1.6 mmol/L (ref 0.5–1.9)

## 2020-06-28 IMAGING — MR MR HEAD WO/W CM
14 series · 48 of 48 positions shown · IV contrast (gadavist)
Comparison: MRI head [DATE]

CLINICAL DATA: Urologic cancer, staging

EXAM:
MRI HEAD WITHOUT AND WITH CONTRAST
TECHNIQUE: Multiplanar, multiecho pulse sequences of the brain and surrounding
structures were obtained without and with intravenous contrast.
CONTRAST:  6mL GADAVIST GADOBUTROL 1 MMOL/ML IV SOLN

[Series 5: DWI · axial · 3.0mm · 1.36mm/px · z∈[-83,+73]mm · 5 of 106 slices shown (1 of 4)]
[im 1/106]
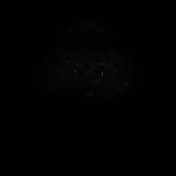
[im 27/106]
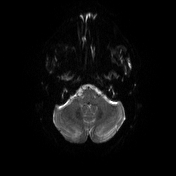
[im 53/106]
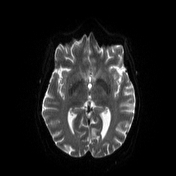
[im 79/106]
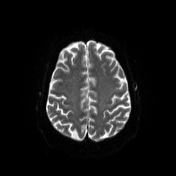
[im 106/106]
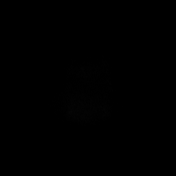

[Series 6: DWI · axial · 3.0mm · 1.36mm/px · z∈[-83,+70]mm · 3 of 53 slices shown (2 of 4)]
[im 1/53]
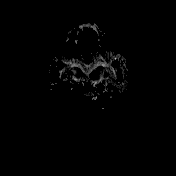
[im 27/53]
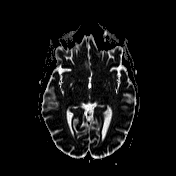
[im 53/53]
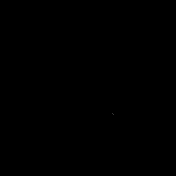

[Series 7: T1 · sagittal · 5.0mm · 0.75mm/px · 1 of 25 slices shown (1 of 2)]
[im 1/25]
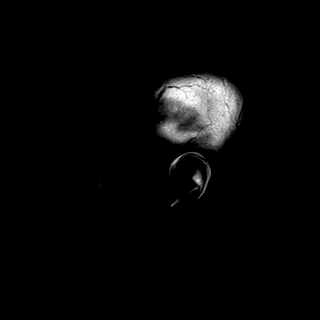

[Series 8: T2 · axial · 5.0mm · 0.62mm/px · 1 of 26 slices shown]
[im 1/26]
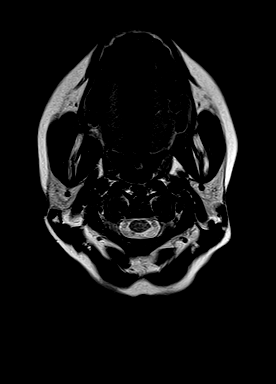

[Series 9: mip_images(sw) · axial · 24.0mm · 0.75mm/px · z∈[-71,+70]mm · 3 of 49 slices shown]
[im 1/49]
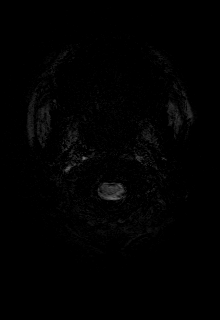
[im 25/49]
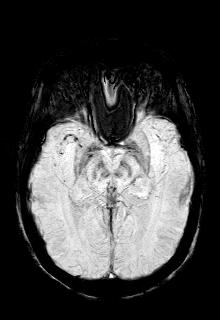
[im 49/49]
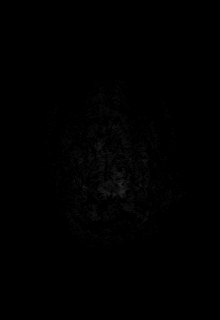

[Series 10: swi_images · axial · 3.0mm · 0.75mm/px · z∈[-82,+80]mm · 3 of 56 slices shown]
[im 1/56]
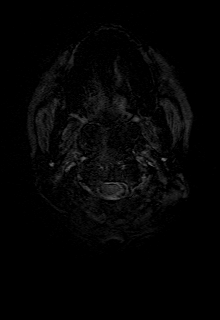
[im 28/56]
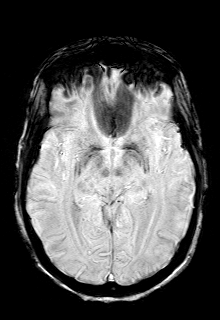
[im 56/56]
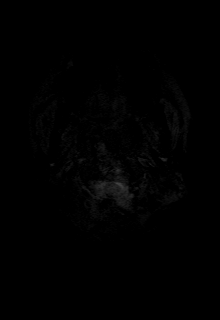

[Series 11: FLAIR · axial · 3.0mm · 0.75mm/px · z∈[-82,+80]mm · 3 of 56 slices shown]
[im 1/56]
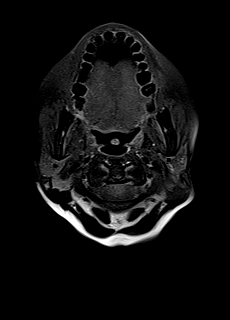
[im 28/56]
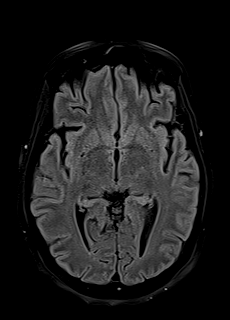
[im 56/56]
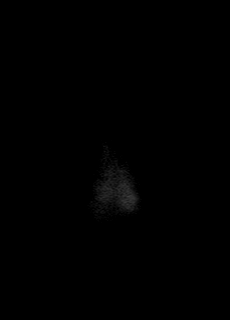

[Series 12: T1 · axial · 1.0mm · 0.94mm/px · z∈[-87,+85]mm · 9 of 176 slices shown (2 of 2)]
[im 1/176]
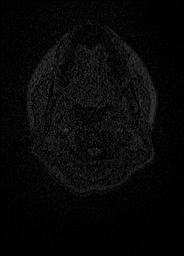
[im 22/176]
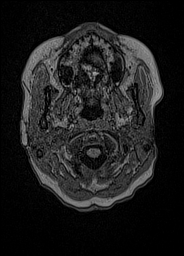
[im 44/176]
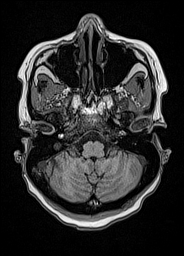
[im 66/176]
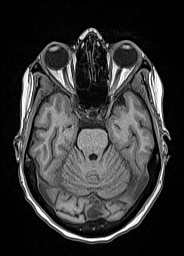
[im 88/176]
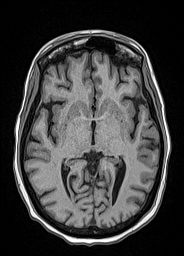
[im 110/176]
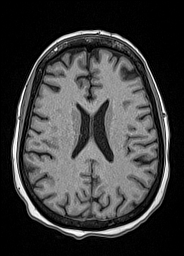
[im 132/176]
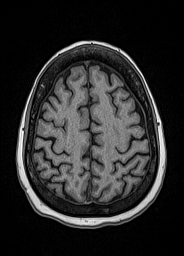
[im 154/176]
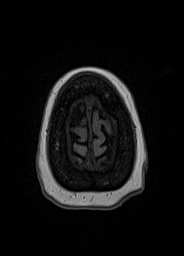
[im 176/176]
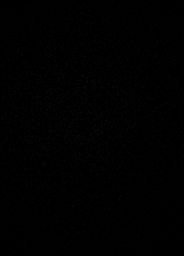

[Series 13: DWI · coronal · 5.0mm · 1.31mm/px · 4 of 72 slices shown (3 of 4)]
[im 1/72]
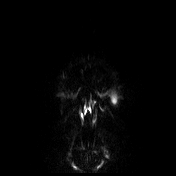
[im 24/72]
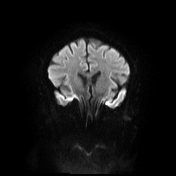
[im 48/72]
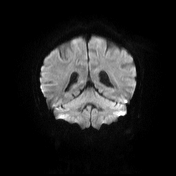
[im 72/72]
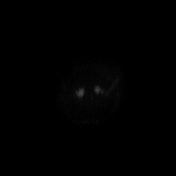

[Series 14: DWI · coronal · 5.0mm · 1.31mm/px · 2 of 36 slices shown (4 of 4)]
[im 1/36]
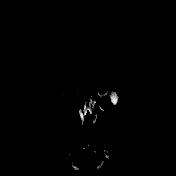
[im 36/36]
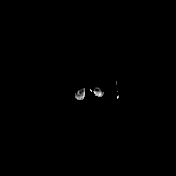

[Series 15: T2 post-contrast · coronal · 5.0mm · 0.57mm/px · 2 of 30 slices shown]
[im 1/30]
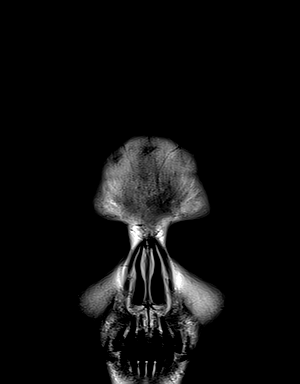
[im 30/30]
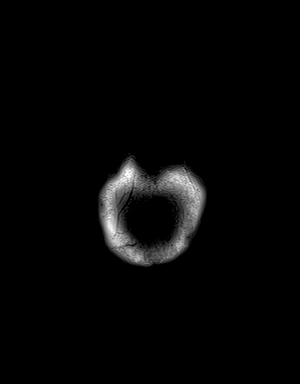

[Series 16: T1 post-contrast · axial · 1.0mm · 0.94mm/px · z∈[-87,+85]mm · 9 of 176 slices shown (1 of 3)]
[im 1/176]
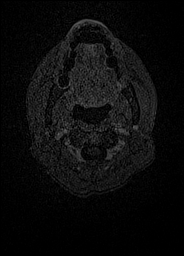
[im 22/176]
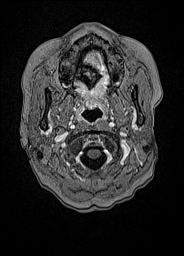
[im 44/176]
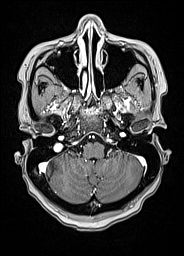
[im 66/176]
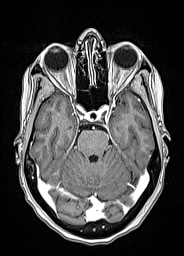
[im 88/176]
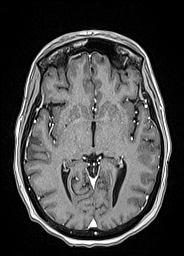
[im 110/176]
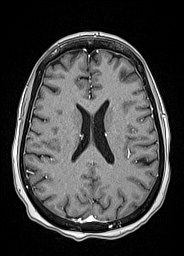
[im 132/176]
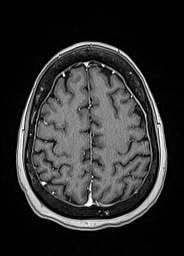
[im 154/176]
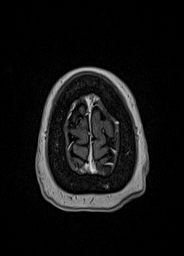
[im 176/176]
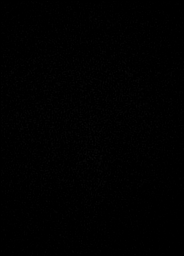

[Series 17: T1 post-contrast · coronal · 5.0mm · 0.43mm/px · 2 of 30 slices shown (2 of 3)]
[im 1/30]
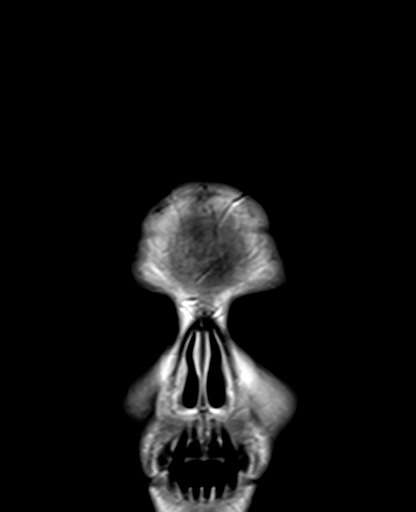
[im 30/30]
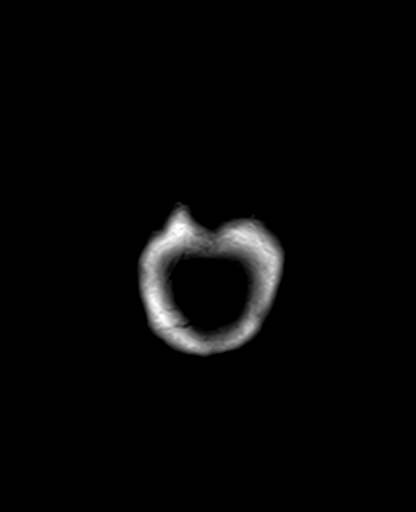

[Series 18: T1 post-contrast · sagittal · 5.0mm · 0.75mm/px · 1 of 25 slices shown (3 of 3)]
[im 1/25]
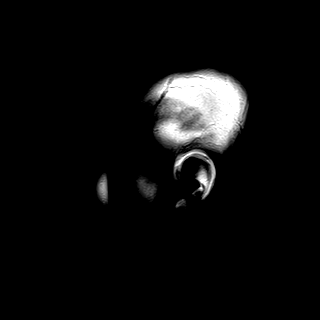

[48 of 48 positions shown; findings below may reference images not displayed]

FINDINGS: Brain: No acute infarction, hemorrhage, hydrocephalus, extra-axial
collection or mass lesion. Normal white matter.

Negative for metastatic disease to the brain. No enhancing mass
lesion in the brain.

Vascular: Normal arterial flow voids.

Skull and upper cervical spine: Enhancing lesion in the lower body
of the C2 vertebral body. This shows decreased signal prior to
contrast and is consistent with metastatic disease. No fracture or
cord compression. No skull lesion identified. Benign fatty lesion in
the left parietal bone.

Sinuses/Orbits: Paranasal sinuses clear.  Negative orbit

Other: None
IMPRESSION: Negative for metastatic disease to the brain

1 cm enhancing lesion C2 vertebral body compatible with metastatic
disease. No fracture or cord compression

## 2020-06-28 MED ORDER — MORPHINE SULFATE (PF) 2 MG/ML IV SOLN
2.0000 mg | INTRAVENOUS | Status: DC | PRN
  Administered 2020-06-28 – 2020-07-16 (×46): 2 mg via INTRAVENOUS
  Filled 2020-06-28 (×46): qty 1

## 2020-06-28 MED ORDER — OXYCODONE HCL 5 MG PO TABS
10.0000 mg | ORAL_TABLET | ORAL | Status: DC | PRN
  Administered 2020-06-28 – 2020-07-16 (×22): 10 mg via ORAL
  Filled 2020-06-28 (×24): qty 2

## 2020-06-28 MED ORDER — GADOBUTROL 1 MMOL/ML IV SOLN
6.0000 mL | Freq: Once | INTRAVENOUS | Status: AC | PRN
  Administered 2020-06-28: 6 mL via INTRAVENOUS

## 2020-06-28 MED ORDER — PROMETHAZINE HCL 25 MG/ML IJ SOLN
12.5000 mg | Freq: Once | INTRAMUSCULAR | Status: AC
  Administered 2020-06-28: 12.5 mg via INTRAVENOUS
  Filled 2020-06-28: qty 1

## 2020-06-28 MED ORDER — PIPERACILLIN-TAZOBACTAM 3.375 G IVPB
3.3750 g | Freq: Three times a day (TID) | INTRAVENOUS | Status: DC
  Administered 2020-06-28 – 2020-07-01 (×11): 3.375 g via INTRAVENOUS
  Filled 2020-06-28 (×11): qty 50

## 2020-06-28 NOTE — Progress Notes (Signed)
Pharmacy Antibiotic Note  Candace Griffin is a 44 y.o. female with hx kidney transplant and recently diagnosed with metastatic carcinoma of unknown primary on chemotherapy PTA (last treatment on 10/21), presented to the ED from [facility] on 06/26/2020 after she was found slumped in her wheelchair with c/o abdominal pain and n/v, admitted with concern for pancreatitis, hepatitis. Today she had recurrent nausea and abdominal pain and WBC up to 35K. Therefore, antibiotics resumed. Pharmacy is consulted for Zosyn dosing.  Plan: Zosyn 3.375g IV q8h (each dose infused over 4 hours) Monitor renal function, cultures, clinical course ______________________________________________  Temp (24hrs), Avg:97.8 F (36.6 C), Min:97.7 F (36.5 C), Max:98.1 F (36.7 C)  Recent Labs  Lab 06/26/20 1031 06/26/20 1153 06/26/20 1433 06/27/20 0253 06/28/20 0528  WBC 22.2* 25.1*  --  19.7* 35.0*  CREATININE 1.38* 1.42*  --  1.07* 1.24*  LATICACIDVEN  --  10.3* 6.1*  --   --     Estimated Creatinine Clearance: 52.9 mL/min (A) (by C-G formula based on SCr of 1.24 mg/dL (H)).    Allergies  Allergen Reactions  . Lisinopril Cough  . Tape Rash and Other (See Comments)    Causes welts also     Thank you for allowing pharmacy to be a part of this patient's care.  Luiz Ochoa 06/28/2020 3:10 PM

## 2020-06-28 NOTE — Progress Notes (Addendum)
PROGRESS NOTE    Candace Griffin  MCN:[id number] DOB: 1976/05/10 DOA: 06/26/2020 PCP: Richarda Osmond, DO    Brief Narrative:  Candace Griffin was admitted to the hospital with a working diagnosis of systemicinflammatoryresponse syndrome, possible pancreatitis/ acute hepatitis, (ruled out sepsis).  44 year old female with a past medical history for renal failure status post kidney transplant, newly diagnosed metastatic carcinoma, unknown primary. Presents with 4 days of intractable nausea, vomiting, abdominal pain, not able to tolerate liquids or oral medications. On her initial physical examination she was hypotensive, after fluid resuscitation her blood pressure was 108/87, heart rate 136, respiratory rate 18, oxygen saturation 100% on room air. She had dry mucous membranes, her lungs were clear to auscultation bilaterally, heart S1-S2, present, tachycardic, abdomen mildly distended, tender in the mid abdomen, no rebound or guarding, no lower extremity edema, no rashes.  Sodium 138, potassium 5.2, chloride 97, bicarb 17, glucose 168, BUN 38, creatinine 1.42, anion gap 24, alkaline phosphatase 646, lipase 56, AST 1461, ALT 586, lactic acid 10.3, white count 25.1, hemoglobin 11.9, hematocrit 39.4, platelets 205. CT of the abdomen with hepatic lesions. Small subcapsular hematoma inferiorly from recent liver biopsy. Bulky retroperitoneal and bilateral inguinal adenopathy. Transplant kidney in the left lower quadrant with unchanged fullness of the renal collecting system. Pancreas with no ductal dilatation or inflammation. Chest radiograph with no infiltrates.  Patient was initially placed on broad spectrum antibiotic therapy and received volume resuscitation.   Systemic inflammatory response syndrome triggered by pancreatitis, medication induced hepatitis.   Sepsis has been ruled out.    Assessment & Plan:   Principal Problem:   Pancreatitis Active Problems:   GERD    Status post kidney transplant   Hypertension   Symptomatic anemia   Metastatic carcinoma to liver (HCC)   AKI (acute kidney injury) ([place])   Hepatitis   Generalized abdominal pain   Renal cell carcinoma ([place])    1.Severe systemic inflammatory response syndrome, suspect pancreatitis, hepatitis (sepsis ruled out). Today she had recurrent nausea and recurrent abdominal pain, her  Wbc is up to 35.   For now will resume antibiotic therapy with Zosyn and will follow up on lactic acid.  Cultures are no growth, follow up cell count in am.   Trial to advance diet as tolerated, continue with as needed antiemetics and analgesics (incrase morphine to 2 mg as needed). BID antiacids.   2.Acute kidney injury likely prerenal/anion gap metabolic acidosis/lactic acidosis. Hyperkalemia. Worsening renal function with serum cr up to 1,24 from 1,0, K is 3,6 and bicarbonate is 20.  Increase IV fluids to 75 ml per H and follow up on renal function in am, avoid hypotension and nephrotoxic medications.   3.Acute Hepatitis/ Elevated liver enzymes/metastatic carcinoma of unknown primary/ drug induced hepatitis.  slowly trending down liver enzymes, AST is 1,129 and ALT 594, Bilirubins is 4,6.  US liver with no gallbladder stone or sonographic signs of acute cholecystitis. 1.8x1.5x1.5 cm hypoechoic lesion left lobe of the liver. Acute viral hepatitis negative. Acetaminophen level is 10.    Holding oncabozantinib, medication that can produce liver toxicity including elevated liver enzymes and bilirubins.   Continue supportive medical care, follow up on INR in am, patient with no clinical signs of encephalopathy.   4.Status post kidney transplant.Continue with home regimen of prednisone.   On mycophenolate and tacrolimus, continue as long liver enzymes are trending down.   5. Anxiety. Continue with orazepam.   Patient continue to be at high risk for worsening hepatitis.  Status is:  Inpatient  Remains inpatient appropriate because:IV treatments appropriate due to intensity of illness or inability to take PO   Dispo: The patient is from: Home              Anticipated d/c is to: Home              Anticipated d/c date is: 3 days              Patient currently is not medically stable to d/c.   DVT prophylaxis: Enoxaparin   Code Status:   full  Family Communication:  No family at the bedside       Consultants:   Oncology    Antimicrobials:   Zosyn     Subjective: Patient this am with recurrent abdominal pain and nausea. No chest pain or dyspnea. She is willing to try regular diet.   Objective: Vitals:   06/28/20 0505 06/28/20 1038 06/28/20 1038 06/28/20 1301  BP: 106/78 90/71 90/71  96/74  Pulse: (!) 110 (!) 115 (!) 114 (!) 113  Resp:  16 16 16   Temp: 97.7 F (36.5 C) 97.7 F (36.5 C) 97.7 F (36.5 C) 98.1 F (36.7 C)  TempSrc: Oral Oral Oral Oral  SpO2: 99%  99% 100%  Weight:      Height:       No intake or output data in the 24 hours ending 06/28/20 1445 Filed Weights   06/26/20 1815  Weight: 57.3 kg    Examination:   General: Not in pain or dyspnea, deconditioned  Neurology: Awake and alert, non focal  E ENT: no pallor, no icterus, oral mucosa moist Cardiovascular: No JVD. S1-S2 present, rhythmic, no gallops, rubs, or murmurs. No lower extremity edema. Pulmonary: vesicular breath sounds bilaterally, adequate air movement, no wheezing, rhonchi or rales. Gastrointestinal. Abdomen soft, tender to palpation at the mid abdoment with no rebound or guarding Skin. No rashes Musculoskeletal: no joint deformities     Data Reviewed: I have personally reviewed following labs and imaging studies  CBC: Recent Labs  Lab 06/26/20 1031 06/26/20 1153 06/27/20 0253 06/28/20 0528  WBC 22.2* 25.1* 19.7* 35.0*  NEUTROABS 18.7* 21.5*  --  30.9*  HGB 12.1 11.9* 9.2* 9.6*  HCT 38.7 39.4 30.2* 31.3*  MCV 79.6* 85.8 85.1 83.7  PLT 205 205 154  412   Basic Metabolic Panel: Recent Labs  Lab 06/26/20 1031 06/26/20 1153 06/27/20 0253 06/28/20 0528  NA 140 138 139 138  K 4.7 5.2* 5.1 3.6  CL 98 97* 108 106  CO2 20* 17* 19* 20*  GLUCOSE 175* 168* 181* 133*  BUN 35* 38* 29* 27*  CREATININE 1.38* 1.42* 1.07* 1.24*  CALCIUM 10.2 9.5 8.0* 8.5*  MG 2.2  --   --   --    GFR: Estimated Creatinine Clearance: 52.9 mL/min (A) (by C-G formula based on SCr of 1.24 mg/dL (H)). Liver Function Tests: Recent Labs  Lab 06/26/20 1031 06/26/20 1153 06/27/20 0253 06/28/20 0528  AST 1,687* 1,461* 1,732* 1,129*  ALT 665* 586* 659* 594*  ALKPHOS 767* 646* 463* 514*  BILITOT 5.1* 5.5* 4.4* 4.6*  PROT 7.9 7.5 5.9* 6.3*  ALBUMIN 2.9* 3.1* 2.4* 2.7*   Recent Labs  Lab 06/26/20 1153 06/27/20 0253  LIPASE 56* 43   No results for input(s): AMMONIA in the last 168 hours. Coagulation Profile: Recent Labs  Lab 06/27/20 0926  INR 1.5*   Cardiac Enzymes: No results for input(s): CKTOTAL, CKMB, CKMBINDEX, TROPONINI in the last  168 hours. BNP (last 3 results) No results for input(s): PROBNP in the last 8760 hours. HbA1C: No results for input(s): HGBA1C in the last 72 hours. CBG: No results for input(s): GLUCAP in the last 168 hours. Lipid Profile: No results for input(s): CHOL, HDL, LDLCALC, TRIG, CHOLHDL, LDLDIRECT in the last 72 hours. Thyroid Function Tests: Recent Labs    06/26/20 1032  TSH 2.569  FREET4 1.45*   Anemia Panel: No results for input(s): VITAMINB12, FOLATE, FERRITIN, TIBC, IRON, RETICCTPCT in the last 72 hours.    Radiology Studies: I have reviewed all of the imaging during this hospital visit personally     Scheduled Meds: . Chlorhexidine Gluconate Cloth  6 each Topical Daily  . enoxaparin (LOVENOX) injection  40 mg Subcutaneous QHS  . mouth rinse  15 mL Mouth Rinse BID  . mycophenolate  500 mg Oral BID  . pantoprazole (PROTONIX) IV  40 mg Intravenous Q12H  . predniSONE  5 mg Oral Q breakfast  .  Tacrolimus ER  4 mg Oral Daily   Continuous Infusions: . dextrose 5% lactated ringers 50 mL/hr at 06/28/20 0544     LOS: 2 days        [employer] Gerome Apley, MD

## 2020-06-28 NOTE — Progress Notes (Addendum)
HEMATOLOGY-ONCOLOGY PROGRESS NOTE  SUBJECTIVE: Still having abdominal pain.  No nausea or vomiting.  Remains afebrile.  Blood cultures remain negative to date.  Oncology History  Metastatic carcinoma to liver ([place])  05/30/2020 Initial Diagnosis   Metastatic carcinoma to liver ([place])   06/13/2020 -  Chemotherapy   The patient had dexamethasone (DECADRON) 4 MG tablet, 8 mg, Oral, Daily, 1 of 1 cycle, Start date: 06/10/2020, End date: -- palonosetron (ALOXI) injection 0.25 mg, 0.25 mg, Intravenous,  Once, 1 of 4 cycles Administration: 0.25 mg (06/13/2020) pegfilgrastim-jmdb (FULPHILA) injection 6 mg, 6 mg, Subcutaneous,  Once, 1 of 4 cycles Administration: 6 mg (06/15/2020) CARBOplatin (PARAPLATIN) 390 mg in sodium chloride 0.9 % 250 mL chemo infusion, 390 mg (78.9 % of original dose 491.5 mg), Intravenous,  Once, 1 of 4 cycles Dose modification:   (original dose 491.5 mg, Cycle 1) Administration: 390 mg (06/13/2020) fosaprepitant (EMEND) 150 mg in sodium chloride 0.9 % 145 mL IVPB, 150 mg, Intravenous,  Once, 1 of 4 cycles Administration: 150 mg (06/13/2020) PACLitaxel (TAXOL) 54 mg in sodium chloride 0.9 % 150 mL chemo infusion (> 80mg /m2), 30 mg/m2 = 54 mg (60 % of original dose 50 mg/m2), Intravenous,  Once, 1 of 4 cycles Dose modification: 50 mg/m2 (original dose 50 mg/m2, Cycle 1, Reason: Change in LFTs), 30 mg/m2 (original dose 50 mg/m2, Cycle 1, Reason: Change in LFTs) Administration: 54 mg (06/13/2020)  for chemotherapy treatment.    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 with unknown primary site [facility])  06/10/2020 Initial Diagnosis   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 with unknown primary site [facility])   06/13/2020 -  Chemotherapy   The patient had dexamethasone (DECADRON) 4 MG tablet, 8 mg, Oral, Daily, 1 of 1 cycle, Start date: 06/10/2020, End date: -- palonosetron (ALOXI) injection 0.25 mg, 0.25 mg, Intravenous,  Once, 1 of 4  cycles Administration: 0.25 mg (06/13/2020) pegfilgrastim-jmdb (FULPHILA) injection 6 mg, 6 mg, Subcutaneous,  Once, 1 of 4 cycles Administration: 6 mg (06/15/2020) CARBOplatin (PARAPLATIN) 390 mg in sodium chloride 0.9 % 250 mL chemo infusion, 390 mg (78.9 % of original dose 491.5 mg), Intravenous,  Once, 1 of 4 cycles Dose modification:   (original dose 491.5 mg, Cycle 1) Administration: 390 mg (06/13/2020) fosaprepitant (EMEND) 150 mg in sodium chloride 0.9 % 145 mL IVPB, 150 mg, Intravenous,  Once, 1 of 4 cycles Administration: 150 mg (06/13/2020) PACLitaxel (TAXOL) 54 mg in sodium chloride 0.9 % 150 mL chemo infusion (> 80mg /m2), 30 mg/m2 = 54 mg (60 % of original dose 50 mg/m2), Intravenous,  Once, 1 of 4 cycles Dose modification: 50 mg/m2 (original dose 50 mg/m2, Cycle 1, Reason: Change in LFTs), 30 mg/m2 (original dose 50 mg/m2, Cycle 1, Reason: Change in LFTs) Administration: 54 mg (06/13/2020)  for chemotherapy treatment.       REVIEW OF SYSTEMS:   Constitutional: Denies fevers, chills Respiratory: Denies cough, dyspnea or wheezes Cardiovascular: Denies palpitation, chest discomfort Gastrointestinal: Reports abdominal pain, denies nausea vomiting Skin: Denies abnormal skin rashes Lymphatics: Denies new lymphadenopathy or easy bruising Neurological:Denies numbness, tingling or new weaknesses Behavioral/Psych: Mood is stable, no new changes  Extremities: No lower extremity edema All other systems were reviewed with the patient and are negative.  I have reviewed the past medical history, past surgical history, social history and family history with the patient and they are unchanged from previous note.   PHYSICAL EXAMINATION: ECOG PERFORMANCE STATUS: 2 - Symptomatic, <50% confined to bed  Vitals:   06/28/20 1038 06/28/20 1301  BP: 90/71 96/74  Pulse: (!) 114 (!) 113  Resp: 16 16  Temp: 97.7 F (36.5 C) 98.1 F (36.7 C)  SpO2: 99% 100%   Filed Weights   06/26/20  1815  Weight: 57.3 kg    Intake/Output from previous day: 11/04 0701 - 11/05 0700 In: 166 [I.V.:166] Out: -   GENERAL:alert, no distress and comfortable SKIN: skin color, texture, turgor are normal, no rashes or significant lesions EYES: Scleral icterus present OROPHARYNX: Mucositis noted in the posterior pharynx LUNGS: clear to auscultation and percussion with normal breathing effort HEART: regular rate & rhythm and no murmurs and no lower extremity edema ABDOMEN:abdomen soft, non-tender and normal bowel sounds Musculoskeletal:no cyanosis of digits and no clubbing  NEURO: alert & oriented x 3 with fluent speech, no focal motor/sensory deficits  LABORATORY DATA:  I have reviewed the data as listed CMP Latest Ref Rng & Units 06/28/2020 06/27/2020 06/26/2020  Glucose 70 - 99 mg/dL 133(H) 181(H) 168(H)  BUN 6 - 20 mg/dL 27(H) 29(H) 38(H)  Creatinine 0.44 - 1.00 mg/dL 1.24(H) 1.07(H) 1.42(H)  Sodium 135 - 145 mmol/L 138 139 138  Potassium 3.5 - 5.1 mmol/L 3.6 5.1 5.2(H)  Chloride 98 - 111 mmol/L 106 108 97(L)  CO2 22 - 32 mmol/L 20(L) 19(L) 17(L)  Calcium 8.9 - 10.3 mg/dL 8.5(L) 8.0(L) 9.5  Total Protein 6.5 - 8.1 g/dL 6.3(L) 5.9(L) 7.5  Total Bilirubin 0.3 - 1.2 mg/dL 4.6(H) 4.4(H) 5.5(H)  Alkaline Phos 38 - 126 U/L 514(H) 463(H) 646(H)  AST 15 - 41 U/L 1,129(H) 1,732(H) 1,461(H)  ALT 0 - 44 U/L 594(H) 659(H) 586(H)    Lab Results  Component Value Date   WBC 35.0 (H) 06/28/2020   HGB 9.6 (L) 06/28/2020   HCT 31.3 (L) 06/28/2020   MCV 83.7 06/28/2020   PLT 187 06/28/2020   NEUTROABS 30.9 (H) 06/28/2020    CT ABDOMEN PELVIS WO CONTRAST  Result Date: 06/26/2020 CLINICAL DATA:  Diffuse abdominal pain and vomiting. History of cancer and renal transplant. Liver mass post recent liver biopsy. EXAM: CT ABDOMEN AND PELVIS WITHOUT CONTRAST TECHNIQUE: Multidetector CT imaging of the abdomen and pelvis was performed following the standard protocol without IV contrast. COMPARISON:   Contrast-enhanced exam 05/27/2020. FINDINGS: Lower chest: No focal airspace disease, pleural fluid, or pulmonary nodularity. Heart is normal in size. Hepatobiliary: Patient's known hepatic lesions are not well demonstrated on noncontrast exam. Liver parenchyma is heterogeneous. There may be a small subcapsular hematoma inferiorly from recent liver biopsy, not well-defined on this noncontrast exam. The gallbladder is present. There is high-density material in the gallbladder lumen, possibly sludge. No calcified gallstone. Common bile duct is poorly defined, but no evidence of biliary dilatation. Pancreas: No ductal dilatation or inflammation. Spleen: Normal in size without focal abnormality. Adrenals/Urinary Tract: Normal adrenal glands. Chronic bilateral native renal atrophy. Solid-appearing lesion in the lower left renal hilum was better appreciated on prior contrast-enhanced CT, grossly unchanged, series 2, image 35. Transplant kidney in the left lower quadrant. Scarring in the left lower pole is better appreciated on prior contrast-enhanced exam. Similar fullness of the renal collecting system without frank hydronephrosis. No transplant perinephric edema. Sequela of failed transplant in the right iliac fossa. The urinary bladder is unremarkable. Stomach/Bowel: Bowel evaluation is limited in the absence of enteric contrast. There is no bowel obstruction or evidence of bowel inflammation. The appendix is normal. Vascular/Lymphatic: Bulky retroperitoneal adenopathy, some of which is low-density and typical  of necrosis. Occasional areas of nodal calcification. Adenopathy extends from the retrocrural space to the bilateral common iliac arteries, and left external iliac station. There also enlarged bilateral inguinal lymph nodes, left greater than right. Index aortocaval node measures 4.1 x 2.4 cm, previously 4.2 x 2.6 cm. Overall adenopathy is not significantly changed in the interim allowing for noncontrast  technique. There is aortic and branch atherosclerosis. Reproductive: Bulky uterus with fibroids.  No obvious adnexal mass. Other: There is trace free fluid in the pelvis, slightly increased from prior exam. No other free fluid or ascites. No free air. Musculoskeletal: Ill-defined sclerosis involving right L2 vertebral body, nonspecific but unchanged from previous. Sclerotic density in the left acetabulum. There is hemi transitional lumbosacral anatomy. No fracture or acute osseous abnormality. IMPRESSION: 1. Patient's known hepatic lesions are not well-defined on this noncontrast exam. There may be a small subcapsular hematoma inferiorly from recent liver biopsy, not well-defined. No evidence of hemoperitoneum. 2. Bulky retroperitoneal and bilateral inguinal adenopathy, grossly unchanged from prior. 3. Transplant kidney in the left lower quadrant with unchanged fullness of the renal collecting system. Sequela of failed transplant in the right iliac fossa. Stable soft tissue density in the region of the native left kidney lower hilum. 4. Bulky uterus with fibroids. 5. Nonspecific sclerosis involving the right aspect of L2 vertebral body. Aortic Atherosclerosis (ICD10-I70.0). Electronically Signed   By: Keith Rake M.D.   On: 06/26/2020 16:45   US BIOPSY (LIVER)  Result Date: 05/31/2020 INDICATION: 44 year old female with widespread nodal and hepatic metastatic disease of uncertain etiology. She does have a solid endophytic mass in the left renal hilum. Patient recently underwent ultrasound-guided core biopsy of a metastatic left inguinal lymph node. Biopsy specimens were diagnostic for carcinoma but nonspecific as to tissue source. Therefore, patient presents for repeat biopsy of 1 of the hepatic lesions. EXAM: ULTRASOUND BIOPSY CORE LIVER MEDICATIONS: None. ANESTHESIA/SEDATION: Moderate (conscious) sedation was employed during this procedure. A total of Versed 2 mg and Fentanyl 100 mcg was administered  intravenously. Moderate Sedation Time: 9 minutes. The patient's level of consciousness and vital signs were monitored continuously by radiology nursing throughout the procedure under my direct supervision. FLUOROSCOPY TIME:  None COMPLICATIONS: None immediate. PROCEDURE: Informed written consent was obtained from the patient after a thorough discussion of the procedural risks, benefits and alternatives. All questions were addressed. Maximal Sterile Barrier Technique was utilized including caps, mask, sterile gowns, sterile gloves, sterile drape, hand hygiene and skin antiseptic. A timeout was performed prior to the initiation of the procedure. The liver was interrogated with ultrasound. A well-defined hepatic lesion was successfully identified. A suitable skin entry site was selected and marked. The overlying skin was sterilely prepped and draped in the standard fashion using chlorhexidine skin prep. Local anesthesia was attained by infiltration with 1% lidocaine. A small dermatotomy was made. Under real-time ultrasound guidance, a 17 gauge introducer needle was advanced through the liver and positioned at the margin of the mass. Multiple 18 gauge core biopsies were then obtained coaxially using a bio Pince automated biopsy device. Biopsy specimens were placed in formalin and delivered to pathology for further analysis. As the introducer needle was removed, the biopsy tract was embolized with a Gel-Foam slurry. The patient tolerated the procedure well. IMPRESSION: Technically successful ultrasound-guided core biopsy of liver lesion. Signed, Criselda Peaches, MD, [facility] Electronically Signed   By: Jacqulynn Cadet M.D.   On: 05/31/2020 16:48   DG [facility]  1 View  Result Date: 06/26/2020 CLINICAL DATA:  Weakness EXAM: PORTABLE CHEST 1 VIEW COMPARISON:  05/26/2020 FINDINGS: The heart size and mediastinal contours are within normal limits.  Both lungs are clear. No pleural effusion or pneumothorax. The visualized skeletal structures are unremarkable. IMPRESSION: No acute process in the chest. Electronically Signed   By: Macy Mis M.D.   On: 06/26/2020 13:45   US PELVIC COMPLETE WITH TRANSVAGINAL  Result Date: 05/30/2020 CLINICAL DATA:  Metastatic cancer unknown primary EXAM: TRANSABDOMINAL AND TRANSVAGINAL ULTRASOUND OF PELVIS TECHNIQUE: Both transabdominal and transvaginal ultrasound examinations of the pelvis were performed. Transabdominal technique was performed for global imaging of the pelvis including uterus, ovaries, adnexal regions, and pelvic cul-de-sac. It was necessary to proceed with endovaginal exam following the transabdominal exam to visualize the uterus endometrium ovaries. COMPARISON:  CT 05/27/2020 FINDINGS: Uterus Measurements: 10.1 x 6.1 x 5.4 cm = volume: 172.5 mL. Nabothian cysts in the cervix. Left posterior myometrial mass measuring 1.4 x 1.8 x 1.1 cm. Right anterior intramural fundal mass measuring 1.6 x 2.1 x 1.6 cm. Endometrium Thickness: 6 mm.  No focal abnormality visualized. Right ovary Measurements: 1.5 x 1.8 x 2.1 cm = volume: 3.1 mL. Normal appearance/no adnexal mass. Left ovary Measurements: 1.4 x 1.8 x 1.8 cm = volume: 2.4 mL. Normal appearance/no adnexal mass. Other findings Moderate free fluid in the pelvis. IMPRESSION: 1. Negative for ovarian mass lesion. 2. Uterine fibroids 3. Moderate free fluid in the pelvis Electronically Signed   By: Donavan Foil M.D.   On: 05/30/2020 21:51   US Abdomen Limited RUQ (LIVER/GB)  Result Date: 06/27/2020 CLINICAL DATA:  Abdominal pain.  Known liver lesions. EXAM: ULTRASOUND ABDOMEN LIMITED RIGHT UPPER QUADRANT COMPARISON:  Noncontrast abdominal CT yesterday. Contrast-enhanced abdominal CT 05/27/2020 FINDINGS: Gallbladder: Physiologically distended containing intraluminal sludge. No shadowing stones. No wall thickening. No sonographic Murphy sign noted by sonographer.  Common bile duct: Diameter: 4-5 mm. Liver: Heterogeneous liver. Many of the liver lesions on prior contrast-enhanced CT are not well seen by ultrasound. There is a 1.8 x 1.5 x 1.5 cm complex hypoechoic lesion in the left lobe. No convincing subcapsular collection is suggested on CT. Portal vein is patent on color Doppler imaging with normal direction of blood flow towards the liver. Other: No right upper quadrant ascites. IMPRESSION: 1. Gallbladder sludge. No gallstones or sonographic findings of acute cholecystitis. 2. No biliary dilatation. 3. Heterogeneous liver with 1.8 cm hypoechoic lesion in the left lobe of the liver. Many of the additional liver lesions on prior contrast-enhanced CT are not well seen by ultrasound. 4. No sonographic evidence of subcapsular collection is suggested on CT. Electronically Signed   By: Keith Rake M.D.   On: 06/27/2020 17:44   US Abdomen Limited RUQ  Result Date: 06/01/2020 CLINICAL DATA:  Liver mass.  Recent biopsy.  Pain post biopsy EXAM: ULTRASOUND ABDOMEN LIMITED RIGHT UPPER QUADRANT COMPARISON:  05/31/2020 FINDINGS: Gallbladder: There is gallbladder sludge without sonographic evidence for acute cholecystitis. There is no gallbladder wall thickening. The sonographic Percell Miller sign is negative. Common bile duct: Diameter: 4 mm Liver: Multiple hepatic lesions are again noted phi, better appreciated on the patient's prior CT. Portal vein is patent on color Doppler imaging with normal direction of blood flow towards the liver. Other: There is a trace amount of perihepatic free fluid. IMPRESSION: Trace perihepatic free fluid. Otherwise, stable exam with multiple hepatic lesions again identified. Electronically Signed   By: Constance Holster M.D.   On: 06/01/2020 03:42  ASSESSMENT: 44 yo with   1) Metastatic carcinoma of unknown primary Patient with h/o renal transplantation on tacrolimus, prednisone and cellcept chronic immunosuppression with extensive metastatic  disease with extensive abd/pelvic LNadenopathy and hepatic metastases.  IHC from LN and liver metastases -- non specific morphology and IC ? Gyn vs renal tubular origin. Tumor markers unrevealing Component     Latest Ref Rng & Units 05/29/2020 05/31/2020  CEA     0.0 - 4.7 ng/mL 0.9   AFP, Serum, Tumor Marker     0.0 - 8.3 ng/mL 1.7   CA 19-9     0 - 35 U/mL 24   Cancer Antigen (CA) 125     0.0 - 38.1 U/mL 20.1   hCG Quant     mIU/mL 3   LDH     98 - 192 U/L  178    2) severe systemic inflammatory response syndrome  3) transaminitis and hyperbilirubinemia  4) AKI with hyperkalemia, improved    PLAN: -Remains on empiric antibiotics per hospitalist service.  Cultures unrevealing so far.  Continue to follow cultures and antibiotics for now. -Cancer-ID was sent and results indicate cancer is likely renal in origin.  We plan to change chemotherapy to Cabometyx at a reduced dose of 20 mg daily.  Anticipate starting this on Monday if no sepsis and liver enzymes remain stable. -LFTs trending downward. ?due to hypotension/sepsis/transplant meds.  Continue to monitor closely. -Recommend nephrology consultation for management of her immunosuppressive drugs.  May need dose adjustment due to her worsening hepatic function. -Recommend MRI of the brain with and without contrast.  This was initially ordered as an outpatient as part of her initial staging work-up.   LOS: 2 days   Mikey Bussing, DNP, AGPCNP-BC, AOCNP 06/28/20  ADDENDUM  .Patient was Personally and independently interviewed, examined and relevant elements of the history of present illness were reviewed in details and an assessment and plan was created. All elements of the patient's history of present illness , assessment and plan were discussed in details with Mikey Bussing, DNP, AGPCNP-BC, AOCN. The above documentation reflects our combined findings assessment and plan.  Sullivan Lone MD MS

## 2020-06-28 NOTE — Progress Notes (Signed)
Patient assessed for yellow MEWS score. Patient resting at this time. Patient complains of abdominal pain 6/10. PRN pain medication not able to be administered until 1244.  Patient has been running ST on tele. Will continue to monitor and follow MEWS protocol.

## 2020-06-29 DIAGNOSIS — K219 Gastro-esophageal reflux disease without esophagitis: Secondary | ICD-10-CM | POA: Diagnosis not present

## 2020-06-29 DIAGNOSIS — K859 Acute pancreatitis without necrosis or infection, unspecified: Secondary | ICD-10-CM | POA: Diagnosis not present

## 2020-06-29 DIAGNOSIS — R1084 Generalized abdominal pain: Secondary | ICD-10-CM | POA: Diagnosis not present

## 2020-06-29 DIAGNOSIS — N179 Acute kidney failure, unspecified: Secondary | ICD-10-CM | POA: Diagnosis not present

## 2020-06-29 LAB — COMPREHENSIVE METABOLIC PANEL
ALT: 400 U/L — ABNORMAL HIGH (ref 0–44)
AST: 555 U/L — ABNORMAL HIGH (ref 15–41)
Albumin: 2.3 g/dL — ABNORMAL LOW (ref 3.5–5.0)
Alkaline Phosphatase: 373 U/L — ABNORMAL HIGH (ref 38–126)
Anion gap: 12 (ref 5–15)
BUN: 25 mg/dL — ABNORMAL HIGH (ref 6–20)
CO2: 20 mmol/L — ABNORMAL LOW (ref 22–32)
Calcium: 8.4 mg/dL — ABNORMAL LOW (ref 8.9–10.3)
Chloride: 104 mmol/L (ref 98–111)
Creatinine, Ser: 1.29 mg/dL — ABNORMAL HIGH (ref 0.44–1.00)
GFR, Estimated: 53 mL/min — ABNORMAL LOW (ref >60)
Glucose, Bld: 146 mg/dL — ABNORMAL HIGH (ref 70–99)
Potassium: 3.2 mmol/L — ABNORMAL LOW (ref 3.5–5.1)
Sodium: 136 mmol/L (ref 135–145)
Total Bilirubin: 3.9 mg/dL — ABNORMAL HIGH (ref 0.3–1.2)
Total Protein: 5.4 g/dL — ABNORMAL LOW (ref 6.5–8.1)

## 2020-06-29 LAB — CBC WITH DIFFERENTIAL/PLATELET
Abs Immature Granulocytes: 1.13 10*3/uL — ABNORMAL HIGH (ref 0.00–0.07)
Basophils Absolute: 0.1 10*3/uL (ref 0.0–0.1)
Basophils Relative: 0 %
Eosinophils Absolute: 0 10*3/uL (ref 0.0–0.5)
Eosinophils Relative: 0 %
HCT: 27.9 % — ABNORMAL LOW (ref 36.0–46.0)
Hemoglobin: 8.6 g/dL — ABNORMAL LOW (ref 12.0–15.0)
Immature Granulocytes: 5 %
Lymphocytes Relative: 6 %
Lymphs Abs: 1.4 10*3/uL (ref 0.7–4.0)
MCH: 25.7 pg — ABNORMAL LOW (ref 26.0–34.0)
MCHC: 30.8 g/dL (ref 30.0–36.0)
MCV: 83.3 fL (ref 80.0–100.0)
Monocytes Absolute: 1.4 10*3/uL — ABNORMAL HIGH (ref 0.1–1.0)
Monocytes Relative: 6 %
Neutro Abs: 20.2 10*3/uL — ABNORMAL HIGH (ref 1.7–7.7)
Neutrophils Relative %: 83 %
Platelets: 140 10*3/uL — ABNORMAL LOW (ref 150–400)
RBC: 3.35 MIL/uL — ABNORMAL LOW (ref 3.87–5.11)
RDW: 23.6 % — ABNORMAL HIGH (ref 11.5–15.5)
WBC: 24.3 10*3/uL — ABNORMAL HIGH (ref 4.0–10.5)
nRBC: 1.8 % — ABNORMAL HIGH (ref 0.0–0.2)

## 2020-06-29 MED ORDER — ONDANSETRON HCL 4 MG/2ML IJ SOLN
4.0000 mg | INTRAMUSCULAR | Status: DC | PRN
  Administered 2020-06-29 – 2020-07-12 (×29): 4 mg via INTRAVENOUS
  Filled 2020-06-29 (×29): qty 2

## 2020-06-29 MED ORDER — POTASSIUM CHLORIDE CRYS ER 20 MEQ PO TBCR
40.0000 meq | EXTENDED_RELEASE_TABLET | ORAL | Status: AC
  Administered 2020-06-29 (×2): 40 meq via ORAL
  Filled 2020-06-29 (×2): qty 2

## 2020-06-29 MED ORDER — ONDANSETRON HCL 4 MG PO TABS
4.0000 mg | ORAL_TABLET | ORAL | Status: DC | PRN
  Administered 2020-06-30: 4 mg via ORAL
  Filled 2020-06-29: qty 1

## 2020-06-29 MED ORDER — OXYCODONE HCL ER 10 MG PO T12A
10.0000 mg | EXTENDED_RELEASE_TABLET | Freq: Two times a day (BID) | ORAL | Status: DC
  Administered 2020-06-29 – 2020-07-02 (×7): 10 mg via ORAL
  Filled 2020-06-29 (×6): qty 1

## 2020-06-29 MED ORDER — SUCRALFATE 1 GM/10ML PO SUSP
1.0000 g | Freq: Three times a day (TID) | ORAL | Status: DC
  Administered 2020-06-29 – 2020-07-15 (×40): 1 g via ORAL
  Filled 2020-06-29 (×46): qty 10

## 2020-06-29 NOTE — Progress Notes (Signed)
PROGRESS NOTE    Candace Griffin  LKT:[id number] DOB: 03-Aug-1976 DOA: 06/26/2020 PCP: Richarda Osmond, DO    Brief Narrative:  Candace Griffin was admittedto the hospital with a working diagnosis of systemicinflammatoryresponse syndrome, possible pancreatitis/ acute hepatitis,(ruledout sepsis).  44 year old female with a past medical history for renal failure status post kidney transplant, newly diagnosed metastatic carcinoma, unknown primary. Presents with 4 days of intractable nausea, vomiting,abdominal pain, not able to tolerate liquids or oral medications. On her initial physical examination she was hypotensive, after fluid resuscitation her blood pressurewas108/87, heart rate 136, respiratory rate 18, oxygen saturation 100% on room air. She had dry mucous membranes, her lungswereclear to auscultation bilaterally, heart S1-S2, present, tachycardic, abdomen mildly distended, tender in the mid abdomen, no rebound or guarding, no lower extremity edema, no rashes.  Sodium 138, potassium 5.2, chloride 97, bicarb 17, glucose 168, BUN 38, creatinine 1.42, anion gap 24, alkaline phosphatase 646, lipase 56, AST 1461, ALT 586, lactic acid 10.3, white count 25.1, hemoglobin 11.9, hematocrit 39.4, platelets 205. CT of the abdomen with hepatic lesions. Small subcapsular hematoma inferiorly from recent liver biopsy. Bulky retroperitoneal and bilateral inguinal adenopathy. Transplant kidney in the left lower quadrant with unchanged fullness of the renal collecting system. Pancreas with no ductal dilatation or inflammation. Chest radiograph with no infiltrates.  Patient was initially placed on broad spectrum antibiotic therapy and received volume resuscitation.   Systemic inflammatory response syndrome triggered by pancreatitis, medication induced hepatitis.   Sepsis has been ruled out.   Liver enzymes, wbc and lactic acid are trending down. Patient with persistent abdominal  pain, nausea and vomiting.    Assessment & Plan:   Principal Problem:   Pancreatitis Active Problems:   GERD   Status post kidney transplant   Hypertension   Symptomatic anemia   Metastatic carcinoma to liver (HCC)   AKI (acute kidney injury) ([place])   Hepatitis   Generalized abdominal pain   Renal cell carcinoma ([place])    1.Severe systemic inflammatory response syndrome, suspect pancreatitis,hepatitis (sepsisruled out). Not able to tolerate regular diet due to abdominal pain, nausea and vomiting.  wbc is down to 24,3, she continue to be afebrile, cultures continue with no growth.    Change diet to soft and add 10 mg oxycodone SR bid, continue with as needed morphine and oxycodone IR, as needed antiemetics (incrase frequency to q 4 H) and scheduled antiacids.  Add sucralfate.   Continue with Zosyn for 24 H more, if no source of infection plan to discontinue antibiotic therapy.   2.Acute kidney injury likely prerenal/anion gap metabolic acidosis/lactic acidosis.Hyperkalemia/hypokelemia.  stable renal function with serum cr at 1,29, K is down to 3,2, bicarbonate is 20, and lactic acid down to 1,6.   Continue hydration with balanced electrolyte solutions, reduce rate at 50 ml per H, add 40 kcl po bid and follow on Mg in am.  Continue supportive therapy with antiemetics.   3.Acute Hepatitis/Elevated liver enzymes/metastatic carcinoma of unknown primary/ drug induced hepatitis.  US liver with no gallbladder stone or sonographic signs of acute cholecystitis. 1.8x1.5x1.5 cm hypoechoic lesion left lobe of the liver. Acute viral hepatitis negative. Acetaminophen level is 10.   Holding oncabozantinib, medication that can produce liver toxicity including elevated liver enzymes and bilirubins.  Continue trending down liver enzymes, AST is 555 and ALT 400, Bilirubins is 3,9. INR 1,5.  Follow up on liver panel in am. Continue soft diet and supportive medical therapy with  analgesics.   4.Status post kidney  transplant.Continue with supressive therapy with prednisone, mycophenolate and tacrolimus.  5. Anxiety.On lorazepam.   Patient continue to be at high risk for worsening hepatitis, renal failure and pancreatitis.   Status is: Inpatient  Remains inpatient appropriate because:IV treatments appropriate due to intensity of illness or inability to take PO   Dispo: The patient is from: Home              Anticipated d/c is to: Home              Anticipated d/c date is: 3 days              Patient currently is not medically stable to d/c.  DVT prophylaxis: Enoxaparin   Code Status:   full  Family Communication:  No family at the bedside       Consultants:   Oncology      Antimicrobials:   zosyn    Subjective: Patient continue to have constant pain at the mid abdomen, moderate to sever in intensity, associated with nausea and vomiting, worse with eating, improved mildly with analgesics.   Objective: Vitals:   06/28/20 2156 06/29/20 0210 06/29/20 0559 06/29/20 1001  BP: 113/83 98/75 101/71 96/68  Pulse: (!) 115 92 (!) 116 (!) 115  Resp: 18 18 20 16   Temp: 98 F (36.7 C) 97.7 F (36.5 C) (!) 97.5 F (36.4 C) 97.9 F (36.6 C)  TempSrc: Oral Oral Oral   SpO2: 99% 100% 100% 98%  Weight:      Height:        Intake/Output Summary (Last 24 hours) at 06/29/2020 1102 Last data filed at 06/29/2020 0500 Gross per 24 hour  Intake 2462.13 ml  Output 300 ml  Net 2162.13 ml   Filed Weights   06/26/20 1815  Weight: 57.3 kg    Examination:   General: in pain, deconditioned  Neurology: Awake and alert, non focal  E ENT: no pallor, no icterus, oral mucosa moist Cardiovascular: No JVD. S1-S2 present, rhythmic, no gallops, rubs, or murmurs. No lower extremity edema. Pulmonary: vesicular breath sounds bilaterally, adequate air movement, no wheezing, rhonchi or rales. Gastrointestinal. Abdomen mild distended, tender to palpation at  the mid abdomina, no rebound or guarding, tympanic to percussion, no masses palpable.   Skin. No rashes Musculoskeletal: no joint deformities     Data Reviewed: I have personally reviewed following labs and imaging studies  CBC: Recent Labs  Lab 06/26/20 1031 06/26/20 1153 06/27/20 0253 06/28/20 0528 06/29/20 0725  WBC 22.2* 25.1* 19.7* 35.0* 24.3*  NEUTROABS 18.7* 21.5*  --  30.9* 20.2*  HGB 12.1 11.9* 9.2* 9.6* 8.6*  HCT 38.7 39.4 30.2* 31.3* 27.9*  MCV 79.6* 85.8 85.1 83.7 83.3  PLT 205 205 154 187 161*   Basic Metabolic Panel: Recent Labs  Lab 06/26/20 1031 06/26/20 1153 06/27/20 0253 06/28/20 0528 06/29/20 0725  NA 140 138 139 138 136  K 4.7 5.2* 5.1 3.6 3.2*  CL 98 97* 108 106 104  CO2 20* 17* 19* 20* 20*  GLUCOSE 175* 168* 181* 133* 146*  BUN 35* 38* 29* 27* 25*  CREATININE 1.38* 1.42* 1.07* 1.24* 1.29*  CALCIUM 10.2 9.5 8.0* 8.5* 8.4*  MG 2.2  --   --   --   --    GFR: Estimated Creatinine Clearance: 50.9 mL/min (A) (by C-G formula based on SCr of 1.29 mg/dL (H)). Liver Function Tests: Recent Labs  Lab 06/26/20 1031 06/26/20 1153 06/27/20 0253 06/28/20 0528 06/29/20 0725  AST 1,687*  1,461* 1,732* 1,129* 555*  ALT 665* 586* 659* 594* 400*  ALKPHOS 767* 646* 463* 514* 373*  BILITOT 5.1* 5.5* 4.4* 4.6* 3.9*  PROT 7.9 7.5 5.9* 6.3* 5.4*  ALBUMIN 2.9* 3.1* 2.4* 2.7* 2.3*   Recent Labs  Lab 06/26/20 1153 06/27/20 0253  LIPASE 56* 43   No results for input(s): AMMONIA in the last 168 hours. Coagulation Profile: Recent Labs  Lab 06/27/20 0926  INR 1.5*   Cardiac Enzymes: No results for input(s): CKTOTAL, CKMB, CKMBINDEX, TROPONINI in the last 168 hours. BNP (last 3 results) No results for input(s): PROBNP in the last 8760 hours. HbA1C: No results for input(s): HGBA1C in the last 72 hours. CBG: No results for input(s): GLUCAP in the last 168 hours. Lipid Profile: No results for input(s): CHOL, HDL, LDLCALC, TRIG, CHOLHDL, LDLDIRECT in  the last 72 hours. Thyroid Function Tests: No results for input(s): TSH, T4TOTAL, FREET4, T3FREE, THYROIDAB in the last 72 hours. Anemia Panel: No results for input(s): VITAMINB12, FOLATE, FERRITIN, TIBC, IRON, RETICCTPCT in the last 72 hours.    Radiology Studies: I have reviewed all of the imaging during this hospital visit personally     Scheduled Meds: . Chlorhexidine Gluconate Cloth  6 each Topical Daily  . enoxaparin (LOVENOX) injection  40 mg Subcutaneous QHS  . mouth rinse  15 mL Mouth Rinse BID  . mycophenolate  500 mg Oral BID  . pantoprazole (PROTONIX) IV  40 mg Intravenous Q12H  . predniSONE  5 mg Oral Q breakfast  . Tacrolimus ER  4 mg Oral Daily   Continuous Infusions: . dextrose 5% lactated ringers 75 mL/hr at 06/28/20 2347  . piperacillin-tazobactam (ZOSYN)  IV 3.375 g (06/29/20 0857)     LOS: 3 days        Hamsini Verrilli Gerome Apley, MD

## 2020-06-30 DIAGNOSIS — K759 Inflammatory liver disease, unspecified: Secondary | ICD-10-CM | POA: Diagnosis not present

## 2020-06-30 DIAGNOSIS — K219 Gastro-esophageal reflux disease without esophagitis: Secondary | ICD-10-CM | POA: Diagnosis not present

## 2020-06-30 DIAGNOSIS — N179 Acute kidney failure, unspecified: Secondary | ICD-10-CM | POA: Diagnosis not present

## 2020-06-30 DIAGNOSIS — K859 Acute pancreatitis without necrosis or infection, unspecified: Secondary | ICD-10-CM | POA: Diagnosis not present

## 2020-06-30 LAB — CBC WITH DIFFERENTIAL/PLATELET
Abs Immature Granulocytes: 1.06 10*3/uL — ABNORMAL HIGH (ref 0.00–0.07)
Basophils Absolute: 0.1 10*3/uL (ref 0.0–0.1)
Basophils Relative: 0 %
Eosinophils Absolute: 0 10*3/uL (ref 0.0–0.5)
Eosinophils Relative: 0 %
HCT: 28.5 % — ABNORMAL LOW (ref 36.0–46.0)
Hemoglobin: 8.8 g/dL — ABNORMAL LOW (ref 12.0–15.0)
Immature Granulocytes: 4 %
Lymphocytes Relative: 7 %
Lymphs Abs: 1.7 10*3/uL (ref 0.7–4.0)
MCH: 26 pg (ref 26.0–34.0)
MCHC: 30.9 g/dL (ref 30.0–36.0)
MCV: 84.1 fL (ref 80.0–100.0)
Monocytes Absolute: 1.6 10*3/uL — ABNORMAL HIGH (ref 0.1–1.0)
Monocytes Relative: 6 %
Neutro Abs: 20 10*3/uL — ABNORMAL HIGH (ref 1.7–7.7)
Neutrophils Relative %: 83 %
Platelets: 156 10*3/uL (ref 150–400)
RBC: 3.39 MIL/uL — ABNORMAL LOW (ref 3.87–5.11)
RDW: 24.2 % — ABNORMAL HIGH (ref 11.5–15.5)
WBC: 24.4 10*3/uL — ABNORMAL HIGH (ref 4.0–10.5)
nRBC: 0.7 % — ABNORMAL HIGH (ref 0.0–0.2)

## 2020-06-30 LAB — COMPREHENSIVE METABOLIC PANEL
ALT: 292 U/L — ABNORMAL HIGH (ref 0–44)
AST: 267 U/L — ABNORMAL HIGH (ref 15–41)
Albumin: 2.2 g/dL — ABNORMAL LOW (ref 3.5–5.0)
Alkaline Phosphatase: 354 U/L — ABNORMAL HIGH (ref 38–126)
Anion gap: 9 (ref 5–15)
BUN: 23 mg/dL — ABNORMAL HIGH (ref 6–20)
CO2: 21 mmol/L — ABNORMAL LOW (ref 22–32)
Calcium: 8.4 mg/dL — ABNORMAL LOW (ref 8.9–10.3)
Chloride: 109 mmol/L (ref 98–111)
Creatinine, Ser: 1.27 mg/dL — ABNORMAL HIGH (ref 0.44–1.00)
GFR, Estimated: 54 mL/min — ABNORMAL LOW (ref >60)
Glucose, Bld: 144 mg/dL — ABNORMAL HIGH (ref 70–99)
Potassium: 3.6 mmol/L (ref 3.5–5.1)
Sodium: 139 mmol/L (ref 135–145)
Total Bilirubin: 3.8 mg/dL — ABNORMAL HIGH (ref 0.3–1.2)
Total Protein: 5.4 g/dL — ABNORMAL LOW (ref 6.5–8.1)

## 2020-06-30 LAB — MAGNESIUM: Magnesium: 1.8 mg/dL (ref 1.7–2.4)

## 2020-06-30 MED ORDER — DIPHENHYDRAMINE HCL 25 MG PO CAPS
25.0000 mg | ORAL_CAPSULE | Freq: Four times a day (QID) | ORAL | Status: DC | PRN
  Administered 2020-06-30 – 2020-07-09 (×2): 25 mg via ORAL
  Filled 2020-06-30 (×2): qty 1

## 2020-06-30 MED ORDER — POTASSIUM CHLORIDE 10 MEQ/100ML IV SOLN
10.0000 meq | INTRAVENOUS | Status: AC
  Administered 2020-06-30 (×3): 10 meq via INTRAVENOUS
  Filled 2020-06-30 (×3): qty 100

## 2020-06-30 MED ORDER — MAGNESIUM SULFATE 2 GM/50ML IV SOLN
2.0000 g | Freq: Once | INTRAVENOUS | Status: AC
  Administered 2020-06-30: 2 g via INTRAVENOUS
  Filled 2020-06-30: qty 50

## 2020-06-30 MED ORDER — METOCLOPRAMIDE HCL 5 MG PO TABS
5.0000 mg | ORAL_TABLET | Freq: Three times a day (TID) | ORAL | Status: DC
  Administered 2020-06-30 – 2020-07-01 (×4): 5 mg via ORAL
  Filled 2020-06-30 (×4): qty 1

## 2020-06-30 MED ORDER — POTASSIUM CHLORIDE CRYS ER 20 MEQ PO TBCR
40.0000 meq | EXTENDED_RELEASE_TABLET | Freq: Once | ORAL | Status: DC
  Filled 2020-06-30: qty 2

## 2020-06-30 NOTE — Progress Notes (Signed)
PROGRESS NOTE    Candace Griffin  BTD:[id number] DOB: 05-Apr-1976 DOA: 06/26/2020 PCP: Richarda Osmond, DO    Brief Narrative:  Mrs. Kreuser was admittedto the hospital with a working diagnosis of systemicinflammatoryresponse syndrome, possible pancreatitis/ acute hepatitis,(ruledout sepsis).  44 year old female with a past medical history for renal failure status post kidney transplant, newly diagnosed metastatic carcinoma, unknown primary. Presents with 4 days of intractable nausea, vomiting,abdominal pain, not able to tolerate liquids or oral medications. On her initial physical examination she was hypotensive, after fluid resuscitation her blood pressurewas108/87, heart rate 136, respiratory rate 18, oxygen saturation 100% on room air. She had dry mucous membranes, her lungswereclear to auscultation bilaterally, heart S1-S2, present, tachycardic, abdomen mildly distended, tender in the mid abdomen, no rebound or guarding, no lower extremity edema, no rashes.  Sodium 138, potassium 5.2, chloride 97, bicarb 17, glucose 168, BUN 38, creatinine 1.42, anion gap 24, alkaline phosphatase 646, lipase 56, AST 1461, ALT 586, lactic acid 10.3, white count 25.1, hemoglobin 11.9, hematocrit 39.4, platelets 205. CT of the abdomen with hepatic lesions. Small subcapsular hematoma inferiorly from recent liver biopsy. Bulky retroperitoneal and bilateral inguinal adenopathy. Transplant kidney in the left lower quadrant with unchanged fullness of the renal collecting system. Pancreas with no ductal dilatation or inflammation. Chest radiograph with no infiltrates.  Patient was initially placed on broad spectrum antibiotic therapy and received volume resuscitation.   Systemic inflammatory response syndrome triggered by pancreatitis, medication induced hepatitis.   Sepsis has been ruled out.  Liver enzymes, wbc and lactic acid are trending down. Patient with persistent abdominal  pain, nausea and vomiting.     Assessment & Plan:   Principal Problem:   Pancreatitis Active Problems:   GERD   Status post kidney transplant   Hypertension   Symptomatic anemia   Metastatic carcinoma to liver (HCC)   AKI (acute kidney injury) ([place])   Hepatitis   Generalized abdominal pain   Renal cell carcinoma ([place])   1.Severe systemic inflammatory response syndrome, suspect pancreatitis,hepatitis(sepsisruled out). Abdominal pain has improved down to 5/10 in intensity, but continue to have nausea and vomiting, herwbc is stable at 24,3 and her cultures continue with no growth.  Continue to be tachycardic at 114 bpm.    Continue soft diet, pain control with 10 mg oxycodone SR bid,  PRN morphine and oxycodone IR. Add scheduled metoclopramide and continue as needed zofran.  Continue with pantoprazole and sucralfate.   Because persistent leukocytosis and medical immunosuppression will continue with IV Zosyn for now, if continue to improve will plan to stop antibiotic therapy in the next 24 to  48 H, no clear source of infection.   Plan to get a contrast study of the abdomen and pelvis in the am.   Out of bed to chair tid with meals, PT and OT evaluation for mobility.   2.Acute kidney injury likely prerenal/anion gap metabolic acidosis/lactic acidosis.Hyperkalemia/hypokelemia/ hypomagnesemia. serum cr is 1,27 today, with K 3,6 and Mg at 1,8.   Continue with D5-LR at 50 ml per H, continue K correction with 40 meq Kcl and add 2 g of Mg. Follow up on renal function in am.    3.Acute Hepatitis/Elevated liver enzymes/metastatic carcinoma of unknown primary/ drug induced hepatitis. US liver with no gallbladder stone or sonographic signs of acute cholecystitis. 1.8x1.5x1.5 cm hypoechoic lesion left lobe of the liver. Acute viral hepatitis negative. Acetaminophen level is 10.  Holdingoncabozantinib, medication that can produce liver toxicity including elevated  liver enzymes and bilirubins.  Trending down liver enzymes, AST is 267 and ALT 292, Bilirubins is 3,8. Continue supportive medical care with analgesics, antiemetics, IV fluids and antiacids.  Plan to get a contrast study of the abdomen and pelvis in am.   4.Status post kidney transplant. On supressive therapy with prednisone,mycophenolate and tacrolimus, with good toleration.   5. Anxiety.Continue withlorazepam.(# 0 doses).   6. Chronic anemia normocytic/ malignancy related. Stable hgb at 8.8 and Hct at 28.5   Patient continue to be at high risk for worsening abdominal pain, leukocytosis and hemodynamic decompensation  Status is: Inpatient  Remains inpatient appropriate because:IV treatments appropriate due to intensity of illness or inability to take PO   Dispo: The patient is from: Home              Anticipated d/c is to: Home              Anticipated d/c date is: 2 days              Patient currently is not medically stable to d/c.  DVT prophylaxis: Enoxaparin   Code Status:     full Family Communication:  No family at the bedside      Consultants:   Oncology      Antimicrobials:   Zosyn     Subjective: Patient continue to have abdominal pain, today is down to 4 and 5 out of 10, continue to have persistent nausea and vomiting, very weak and deconditioned, has not been out of the bed due to pain.   Objective: Vitals:   06/29/20 2146 06/30/20 0143 06/30/20 0610 06/30/20 0836  BP: 107/84 106/78 97/75 100/73  Pulse: (!) 113 (!) 113 (!) 114 (!) 114  Resp: 20 20 20 16   Temp: 97.6 F (36.4 C) 97.8 F (36.6 C) 98.3 F (36.8 C) 97.6 F (36.4 C)  TempSrc: Oral Oral Oral Oral  SpO2: 100% 100% 100% 100%  Weight:      Height:        Intake/Output Summary (Last 24 hours) at 06/30/2020 1031 Last data filed at 06/29/2020 2150 Gross per 24 hour  Intake --  Output 200 ml  Net -200 ml   Filed Weights   06/26/20 1815  Weight: 57.3 kg    Examination:    General: In pain but no dyspnea, deconditioned  Neurology: Awake and alert, non focal  E ENT: mild pallor, no icterus, oral mucosa moist Cardiovascular: No JVD. S1-S2 present, rhythmic, no gallops, rubs, or murmurs. No lower extremity edema. Pulmonary: positive breath sounds bilaterally with no wheezing, rhonchi or rales. Gastrointestinal. Abdomen mild distended, not tender to superficial palpation, no guarding or rebound  Skin. No rashes Musculoskeletal: no joint deformities     Data Reviewed: I have personally reviewed following labs and imaging studies  CBC: Recent Labs  Lab 06/26/20 1031 06/26/20 1031 06/26/20 1153 06/27/20 0253 06/28/20 0528 06/29/20 0725 06/30/20 0652  WBC 22.2*   < > 25.1* 19.7* 35.0* 24.3* 24.4*  NEUTROABS 18.7*  --  21.5*  --  30.9* 20.2* 20.0*  HGB 12.1   < > 11.9* 9.2* 9.6* 8.6* 8.8*  HCT 38.7   < > 39.4 30.2* 31.3* 27.9* 28.5*  MCV 79.6*   < > 85.8 85.1 83.7 83.3 84.1  PLT 205   < > 205 154 187 140* 156   < > = values in this interval not displayed.   Basic Metabolic Panel: Recent Labs  Lab 06/26/20 1031 06/26/20 1031 06/26/20 1153 06/27/20 0253 06/28/20 9371  06/29/20 0725 06/30/20 0652  NA 140   < > 138 139 138 136 139  K 4.7   < > 5.2* 5.1 3.6 3.2* 3.6  CL 98   < > 97* 108 106 104 109  CO2 20*   < > 17* 19* 20* 20* 21*  GLUCOSE 175*   < > 168* 181* 133* 146* 144*  BUN 35*   < > 38* 29* 27* 25* 23*  CREATININE 1.38*  --  1.42* 1.07* 1.24* 1.29* 1.27*  CALCIUM 10.2   < > 9.5 8.0* 8.5* 8.4* 8.4*  MG 2.2  --   --   --   --   --  1.8   < > = values in this interval not displayed.   GFR: Estimated Creatinine Clearance: 51.7 mL/min (A) (by C-G formula based on SCr of 1.27 mg/dL (H)). Liver Function Tests: Recent Labs  Lab 06/26/20 1153 06/27/20 0253 06/28/20 0528 06/29/20 0725 06/30/20 0652  AST 1,461* 1,732* 1,129* 555* 267*  ALT 586* 659* 594* 400* 292*  ALKPHOS 646* 463* 514* 373* 354*  BILITOT 5.5* 4.4* 4.6* 3.9* 3.8*   PROT 7.5 5.9* 6.3* 5.4* 5.4*  ALBUMIN 3.1* 2.4* 2.7* 2.3* 2.2*   Recent Labs  Lab 06/26/20 1153 06/27/20 0253  LIPASE 56* 43   No results for input(s): AMMONIA in the last 168 hours. Coagulation Profile: Recent Labs  Lab 06/27/20 0926  INR 1.5*   Cardiac Enzymes: No results for input(s): CKTOTAL, CKMB, CKMBINDEX, TROPONINI in the last 168 hours. BNP (last 3 results) No results for input(s): PROBNP in the last 8760 hours. HbA1C: No results for input(s): HGBA1C in the last 72 hours. CBG: No results for input(s): GLUCAP in the last 168 hours. Lipid Profile: No results for input(s): CHOL, HDL, LDLCALC, TRIG, CHOLHDL, LDLDIRECT in the last 72 hours. Thyroid Function Tests: No results for input(s): TSH, T4TOTAL, FREET4, T3FREE, THYROIDAB in the last 72 hours. Anemia Panel: No results for input(s): VITAMINB12, FOLATE, FERRITIN, TIBC, IRON, RETICCTPCT in the last 72 hours.    Radiology Studies: I have reviewed all of the imaging during this hospital visit personally     Scheduled Meds: . Chlorhexidine Gluconate Cloth  6 each Topical Daily  . enoxaparin (LOVENOX) injection  40 mg Subcutaneous QHS  . mouth rinse  15 mL Mouth Rinse BID  . metoCLOPramide  5 mg Oral TID AC & HS  . mycophenolate  500 mg Oral BID  . oxyCODONE  10 mg Oral Q12H  . pantoprazole (PROTONIX) IV  40 mg Intravenous Q12H  . predniSONE  5 mg Oral Q breakfast  . sucralfate  1 g Oral TID WC & HS  . Tacrolimus ER  4 mg Oral Daily   Continuous Infusions: . dextrose 5% lactated ringers 50 mL/hr at 06/29/20 2213  . piperacillin-tazobactam (ZOSYN)  IV 3.375 g (06/30/20 1740)     LOS: 4 days        Nareh Matzke Gerome Apley, MD

## 2020-06-30 NOTE — Plan of Care (Signed)
  Problem: Education: Goal: Knowledge of General Education information will improve Description: Including pain rating scale, medication(s)/side effects and non-pharmacologic comfort measures Outcome: Progressing   Problem: Health Behavior/Discharge Planning: Goal: Ability to manage health-related needs will improve Outcome: Progressing   Problem: Clinical Measurements: Goal: Ability to maintain clinical measurements within normal limits will improve Outcome: Progressing Goal: Will remain free from infection Outcome: Progressing Goal: Diagnostic test results will improve Outcome: Progressing Goal: Respiratory complications will improve Outcome: Progressing Goal: Cardiovascular complication will be avoided Outcome: Progressing   Problem: Coping: Goal: Level of anxiety will decrease Outcome: Progressing   Problem: Elimination: Goal: Will not experience complications related to urinary retention Outcome: Progressing   Problem: Pain Managment: Goal: General experience of comfort will improve Outcome: Progressing   Problem: Safety: Goal: Ability to remain free from injury will improve Outcome: Progressing   Problem: Skin Integrity: Goal: Risk for impaired skin integrity will decrease Outcome: Progressing   

## 2020-07-01 ENCOUNTER — Inpatient Hospital Stay (HOSPITAL_COMMUNITY): Payer: Medicare Other

## 2020-07-01 DIAGNOSIS — C649 Malignant neoplasm of unspecified kidney, except renal pelvis: Secondary | ICD-10-CM | POA: Diagnosis not present

## 2020-07-01 DIAGNOSIS — N179 Acute kidney failure, unspecified: Secondary | ICD-10-CM | POA: Diagnosis not present

## 2020-07-01 DIAGNOSIS — K859 Acute pancreatitis without necrosis or infection, unspecified: Secondary | ICD-10-CM | POA: Diagnosis not present

## 2020-07-01 DIAGNOSIS — K219 Gastro-esophageal reflux disease without esophagitis: Secondary | ICD-10-CM | POA: Diagnosis not present

## 2020-07-01 DIAGNOSIS — K759 Inflammatory liver disease, unspecified: Secondary | ICD-10-CM | POA: Diagnosis not present

## 2020-07-01 DIAGNOSIS — C787 Secondary malignant neoplasm of liver and intrahepatic bile duct: Secondary | ICD-10-CM | POA: Diagnosis not present

## 2020-07-01 LAB — COMPREHENSIVE METABOLIC PANEL
ALT: 228 U/L — ABNORMAL HIGH (ref 0–44)
AST: 158 U/L — ABNORMAL HIGH (ref 15–41)
Albumin: 2.2 g/dL — ABNORMAL LOW (ref 3.5–5.0)
Alkaline Phosphatase: 389 U/L — ABNORMAL HIGH (ref 38–126)
Anion gap: 11 (ref 5–15)
BUN: 19 mg/dL (ref 6–20)
CO2: 19 mmol/L — ABNORMAL LOW (ref 22–32)
Calcium: 8.3 mg/dL — ABNORMAL LOW (ref 8.9–10.3)
Chloride: 106 mmol/L (ref 98–111)
Creatinine, Ser: 1.04 mg/dL — ABNORMAL HIGH (ref 0.44–1.00)
GFR, Estimated: >60 mL/min (ref >60)
Glucose, Bld: 102 mg/dL — ABNORMAL HIGH (ref 70–99)
Potassium: 4.9 mmol/L (ref 3.5–5.1)
Sodium: 136 mmol/L (ref 135–145)
Total Bilirubin: 4.6 mg/dL — ABNORMAL HIGH (ref 0.3–1.2)
Total Protein: 5.5 g/dL — ABNORMAL LOW (ref 6.5–8.1)

## 2020-07-01 LAB — CBC WITH DIFFERENTIAL/PLATELET
Abs Immature Granulocytes: 0.87 10*3/uL — ABNORMAL HIGH (ref 0.00–0.07)
Basophils Absolute: 0.1 10*3/uL (ref 0.0–0.1)
Basophils Relative: 0 %
Eosinophils Absolute: 0 10*3/uL (ref 0.0–0.5)
Eosinophils Relative: 0 %
HCT: 28.1 % — ABNORMAL LOW (ref 36.0–46.0)
Hemoglobin: 8.5 g/dL — ABNORMAL LOW (ref 12.0–15.0)
Immature Granulocytes: 4 %
Lymphocytes Relative: 7 %
Lymphs Abs: 1.5 10*3/uL (ref 0.7–4.0)
MCH: 25.4 pg — ABNORMAL LOW (ref 26.0–34.0)
MCHC: 30.2 g/dL (ref 30.0–36.0)
MCV: 83.9 fL (ref 80.0–100.0)
Monocytes Absolute: 1.7 10*3/uL — ABNORMAL HIGH (ref 0.1–1.0)
Monocytes Relative: 8 %
Neutro Abs: 17.8 10*3/uL — ABNORMAL HIGH (ref 1.7–7.7)
Neutrophils Relative %: 81 %
Platelets: 155 10*3/uL (ref 150–400)
RBC: 3.35 MIL/uL — ABNORMAL LOW (ref 3.87–5.11)
RDW: 25.2 % — ABNORMAL HIGH (ref 11.5–15.5)
WBC: 21.9 10*3/uL — ABNORMAL HIGH (ref 4.0–10.5)
nRBC: 0.2 % (ref 0.0–0.2)

## 2020-07-01 LAB — CULTURE, BLOOD (ROUTINE X 2)
Culture: NO GROWTH
Culture: NO GROWTH
Special Requests: ADEQUATE

## 2020-07-01 IMAGING — CT CT ABD-PELV W/ CM
2 of 5 series · 15 of 46 positions shown, 17 images · IV contrast (APPLIED)
Comparison: [DATE] and previous

CLINICAL DATA: Sepsis, abdominal pain, previous kidney transplant
x2. Metastatic carcinoma of unknown primary.

EXAM:
CT ABDOMEN AND PELVIS WITH CONTRAST
TECHNIQUE: Multidetector CT imaging of the abdomen and pelvis was performed
using the standard protocol following bolus administration of
intravenous contrast.
CONTRAST:  80mL OMNIPAQUE IOHEXOL 300 MG/ML  SOLN

[Series 2: axial st · axial · 0.69mm/px · z∈[-699,-294]mm · 12 of 97 slices shown, 14 images]
[im 8/97  soft-tissue]
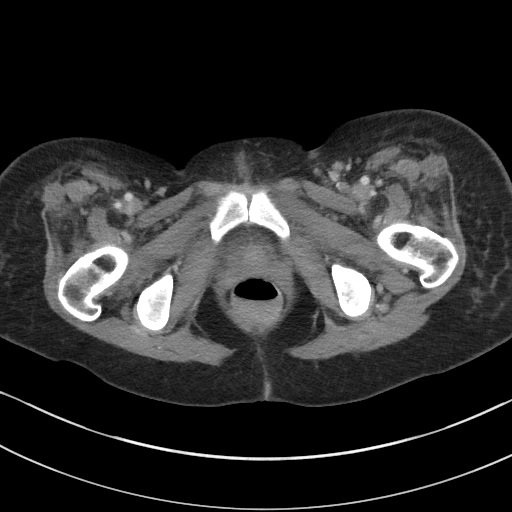
[im 8/97  bone]
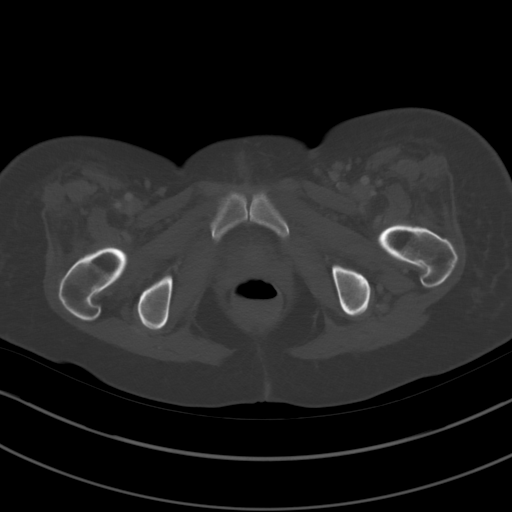
[im 15/97  soft-tissue]
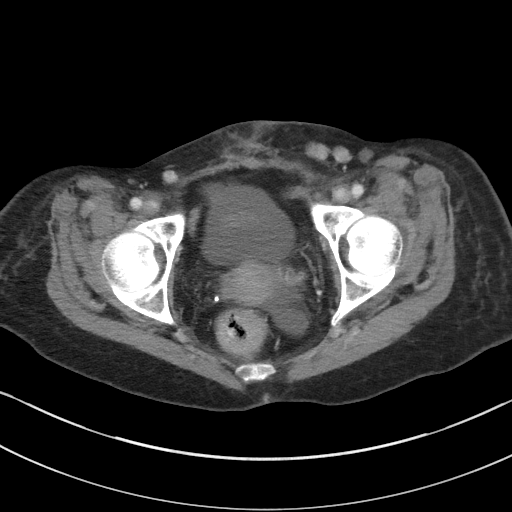
[im 23/97  soft-tissue]
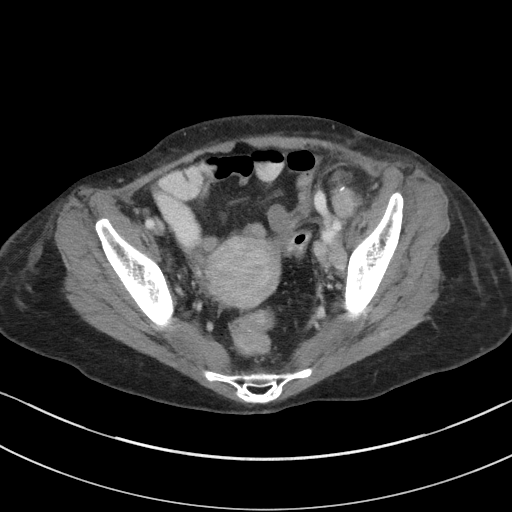
[im 30/97  soft-tissue]
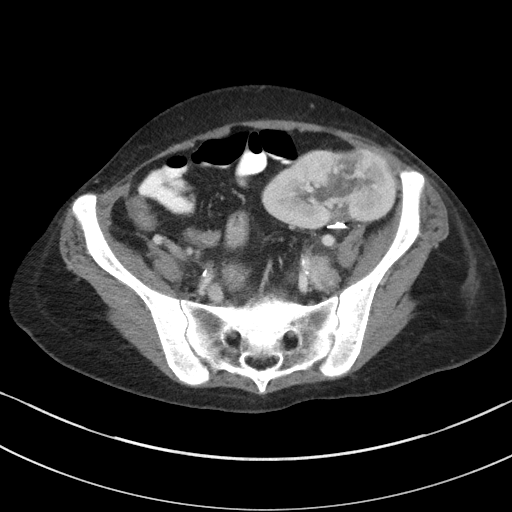
[im 37/97  soft-tissue]
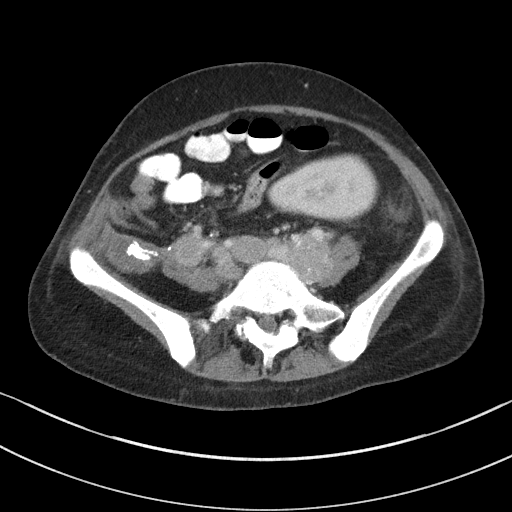
[im 45/97  soft-tissue]
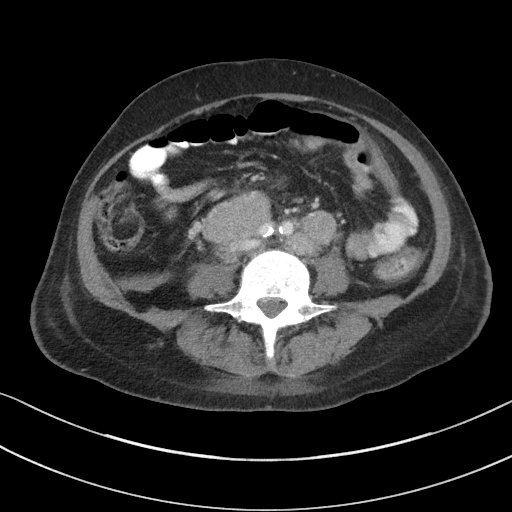
[im 52/97  soft-tissue]
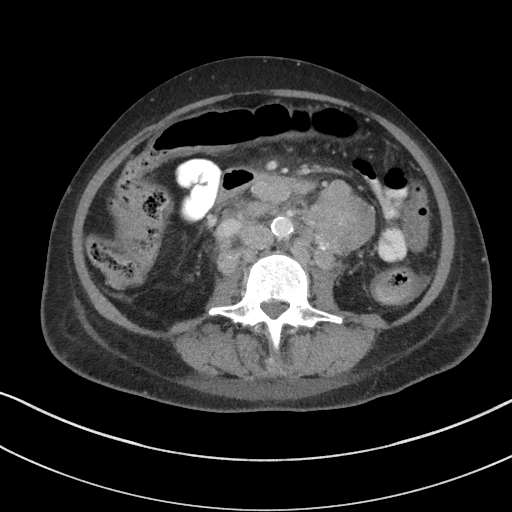
[im 60/97  soft-tissue]
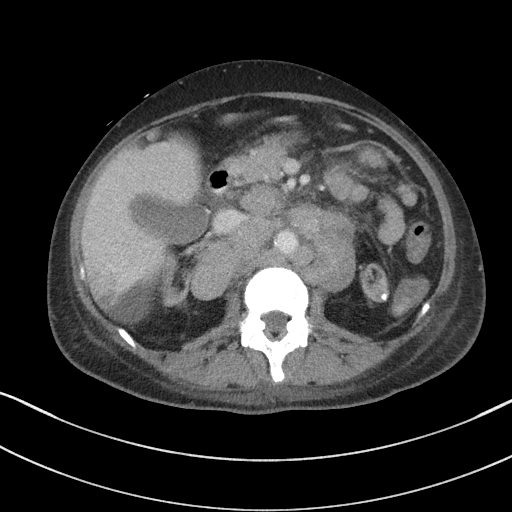
[im 67/97  soft-tissue]
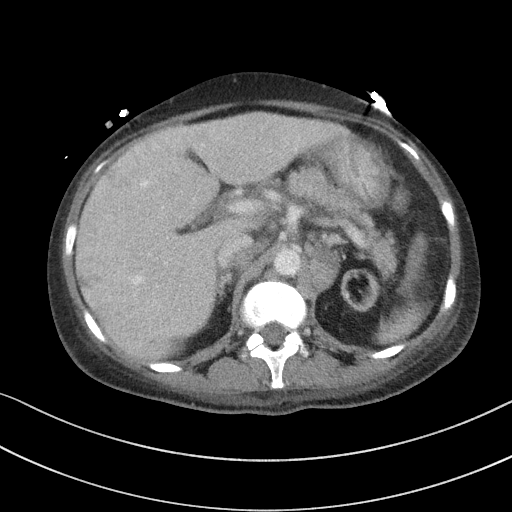
[im 67/97  bone]
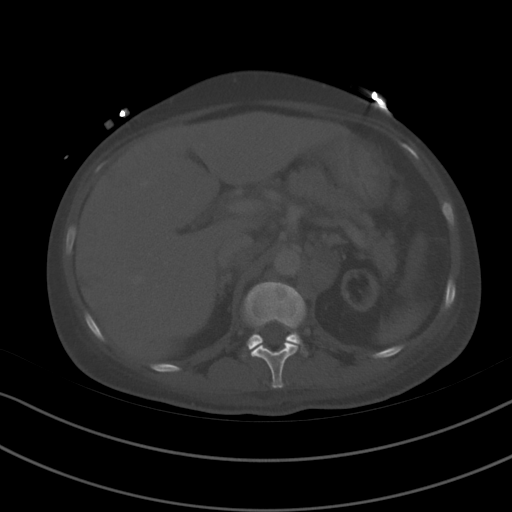
[im 74/97  soft-tissue]
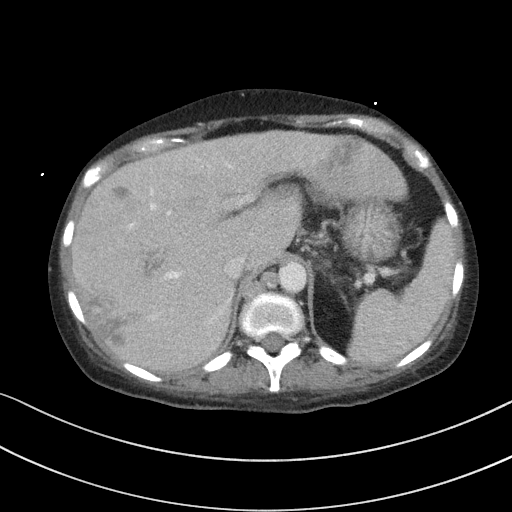
[im 82/97  soft-tissue]
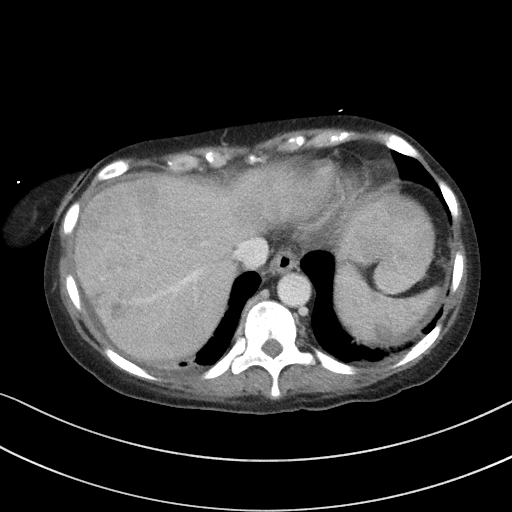
[im 89/97  soft-tissue]
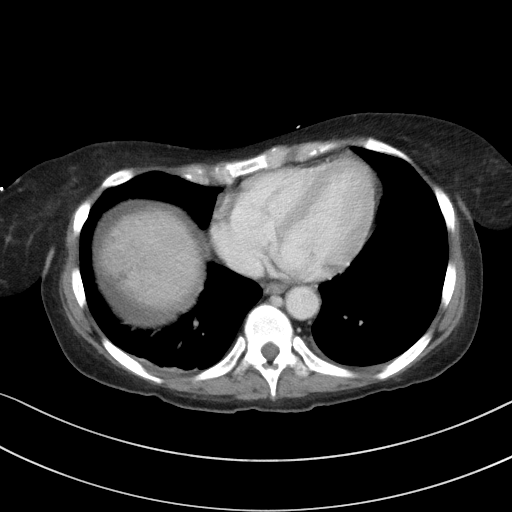

[Series 5: coronal st · coronal · 0.72mm/px · 3 of 89 slices shown]
[im 30/89  soft-tissue]
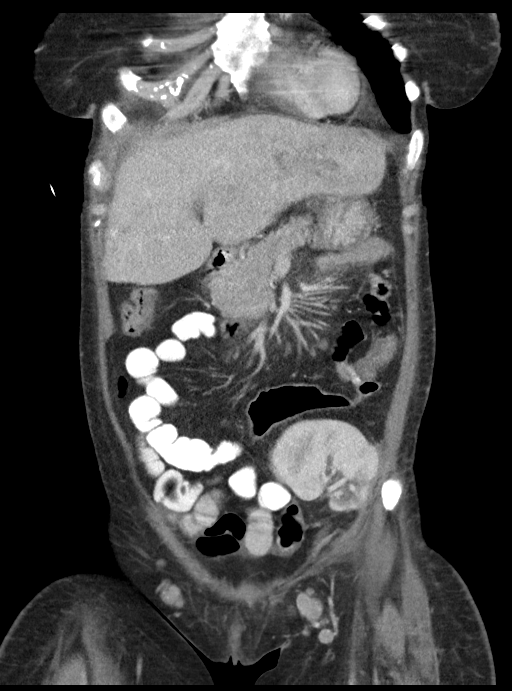
[im 40/89  soft-tissue]
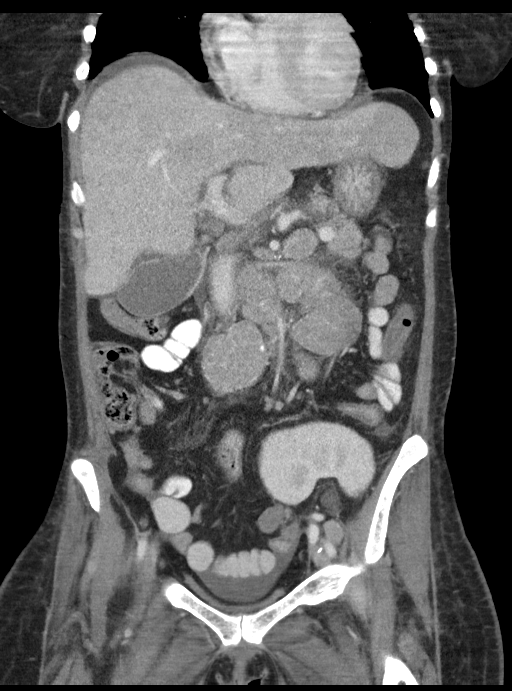
[im 49/89  soft-tissue]
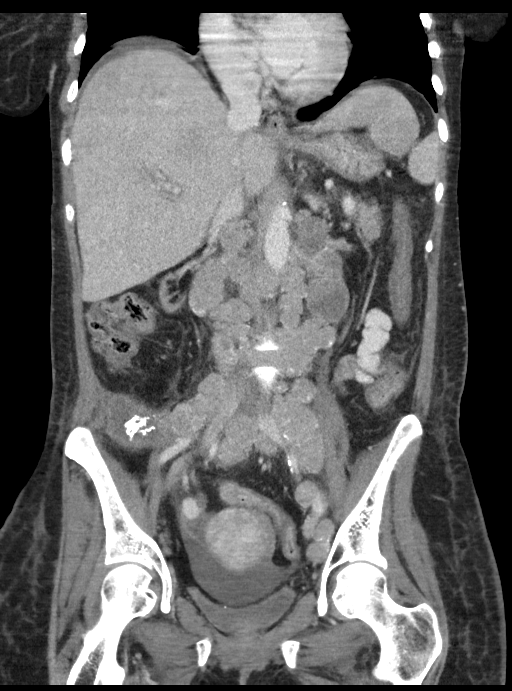

[15 of 46 positions shown; findings below may reference images not displayed]

FINDINGS: Lower chest: Trace pleural effusions.

Hepatobiliary: Multiple liver lesions. 3.2 cm segment 2 lesion shows
definite progression since [DATE], although is more wedge-shaped
and may represent focal infarct. Multiple low-attenuation lesions in
the right lobe have not convincingly changed allowing for
differences in scan timing. There are nonspecific transient hepatic
attenuation differences on the portal venous phase which resolve on
delayed venous phase imaging. No biliary ductal dilatation.
Gallbladder unremarkable.

Pancreas: Unremarkable. No pancreatic ductal dilatation or
surrounding inflammatory changes.

Spleen: Normal in size. There peripheral areas of decreased
enhancement which were not present on prior study of [DATE],
however this area was not included on the delayed scan, and may
represent transient attenuation differences versus true lesions.

Adrenals/Urinary Tract: Adrenal glands unremarkable. Marked atrophy
of native kidneys. Normal enhancement of left iliac fossa transplant
kidney. Coarse calcifications and low-attenuation in the right iliac
fossa presumably related to remote transplant. The urinary bladder
is incompletely distended.

Stomach/Bowel: Stomach is decompressed. Small bowel is nondilated.
The colon is incompletely distended, unremarkable.

Vascular/Lymphatic: Bulky bilateral inguinal, left external iliac,
bilateral common iliac, left para-aortic and aortocaval adenopathy
as before. The larger nodes contain scattered calcifications and
some with central low-attenuation regions as before. Moderate
atheromatous aortoiliac plaque without aneurysm.

Reproductive: Uterine fibroid.  No adnexal mass.

Other: Small volume perihepatic and pelvic ascites slightly
increased. No free air.

Musculoskeletal: Poorly marginated sclerotic lesions in T12, L1, and
L2 vertebral bodies, progressive since [DATE]. benign left
supra-acetabular bone island stable since [DATE].
IMPRESSION: 1. No definite source of sepsis identified.
2. Extensive pelvic and retroperitoneal nodal; hepatic; and osseous
metastatic disease.
3. Slight increase in small volume perihepatic and pelvic ascites.

Aortic Atherosclerosis ([RU]-[RU]).

## 2020-07-01 MED ORDER — NON FORMULARY: 20.0000 mg | Freq: Every day | Status: DC

## 2020-07-01 MED ORDER — IOHEXOL 9 MG/ML PO SOLN
500.0000 mL | ORAL | Status: AC
  Administered 2020-07-01: 500 mL via ORAL

## 2020-07-01 MED ORDER — IOHEXOL 9 MG/ML PO SOLN
ORAL | Status: AC
  Administered 2020-07-01: 500 mL via ORAL
  Filled 2020-07-01: qty 1000

## 2020-07-01 MED ORDER — PROMETHAZINE HCL 25 MG/ML IJ SOLN
12.5000 mg | Freq: Three times a day (TID) | INTRAMUSCULAR | Status: DC
  Administered 2020-07-01 – 2020-07-14 (×38): 12.5 mg via INTRAVENOUS
  Filled 2020-07-01 (×38): qty 1

## 2020-07-01 MED ORDER — IOHEXOL 300 MG/ML  SOLN
80.0000 mL | Freq: Once | INTRAMUSCULAR | Status: AC | PRN
  Administered 2020-07-01: 80 mL via INTRAVENOUS

## 2020-07-01 MED ORDER — CABOZANTINIB S-MALATE 20 MG PO TABS
20.0000 mg | ORAL_TABLET | Freq: Every day | ORAL | Status: DC
  Administered 2020-07-01 – 2020-07-09 (×8): 20 mg via ORAL

## 2020-07-01 MED ORDER — PROMETHAZINE HCL 25 MG/ML IJ SOLN: 25.0000 mg | Freq: Three times a day (TID) | INTRAMUSCULAR | Status: DC

## 2020-07-01 NOTE — Plan of Care (Signed)

## 2020-07-01 NOTE — Telephone Encounter (Signed)
Oral Chemotherapy Pharmacist Encounter   Cabometyx (cabozantinib) 20 mg tablets picked up from [facility] for patient and delivered to [facility] [place] inpatient pharmacy. Planned start date: 07/01/2020.  Oral Oncology Clinic will continue to follow  Leron Croak, PharmD, BCPS Hematology/Oncology Clinical Pharmacist [facility] [phone] 07/01/2020 12:09 PM

## 2020-07-01 NOTE — Progress Notes (Signed)
Occupational Therapy Evaluation Patient Details Name: Candace Griffin MRN: [id number] DOB: 1976/01/25 Today's Date: 07/01/2020    History of Present Illness 44 year old female with a past medical history for renal failure status post kidney transplant, newly diagnosed metastatic carcinoma to liver, unknown primary.  Presents with intractable nausea, vomiting, abdominal pain, not able to tolerate liquids or oral medications. Renal cell carcinoma. SIRS; AKI; acute hepatitis; anxiety.    Clinical Impression   PTA, pt very independent, enjoys spending time with family and reading. Pt has had a significant decline over the past couple of weeks due to pain and generalized weakness. Session limited to bed level this am due to pain. Pt with increased complaints of back and B thigh pain - nsg and MD made aware. Pt will benefit from acute OT to educate on energy conservation strategies, compensatory techniques for ADL with AE/DME and establishment of HEP. At this time, do not feel pt will need HHOT as she has a very supportive family who can assist at DC, however will monitor and adjust plan of care if needed.     Follow Up Recommendations  Supervision - Intermittent;No OT follow up (pending progress)   Equipment Recommendations  Other (comment) (may benefit form rollator)    Recommendations for Other Services Other (comment) (Palliative)     Precautions / Restrictions Precautions Precautions: Fall Precaution Comments: significant pain Restrictions Weight Bearing Restrictions: No      Mobility Bed Mobility Overal bed mobility: Modified Independent                  Transfers                 General transfer comment: declined    Balance Overall balance assessment:  (no reported falls)                                         ADL either performed or assessed with clinical judgement   ADL Overall ADL's : Needs assistance/impaired Eating/Feeding:  Modified independent Eating/Feeding Details (indicate cue type and reason): poor tolerance of POs Grooming: Set up;Sitting   Upper Body Bathing: Set up;Sitting   Lower Body Bathing: Set up;Bed level   Upper Body Dressing : Set up;Sitting   Lower Body Dressing: Minimal assistance;Bed level               Functional mobility during ADLs:  (declined due to pain) General ADL Comments: mobility limited by pain. Pt states that nsg staff has not had to physically help her but just "stand there in case she needs them"     Vision Baseline Vision/History: Wears glasses Wears Glasses: At all times Patient Visual Report: No change from baseline       Perception     Praxis      Pertinent Vitals/Pain Pain Assessment: 0-10 Pain Score: 6  Pain Location: back; abdomen; B thighs Pain Descriptors / Indicators: Aching;Burning Pain Intervention(s): Limited activity within patient's tolerance;Premedicated before session;Repositioned     Hand Dominance Left   Extremity/Trunk Assessment Upper Extremity Assessment Upper Extremity Assessment: Generalized weakness   Lower Extremity Assessment Lower Extremity Assessment: Defer to PT evaluation;Generalized weakness   Cervical / Trunk Assessment Cervical / Trunk Assessment: Other exceptions;Normal (low back pain)   Communication Communication Communication: No difficulties   Cognition Arousal/Alertness: Awake/alert Behavior During Therapy: WFL for tasks assessed/performed Overall Cognitive Status: Within Functional Limits for tasks  assessed                                     General Comments       Exercises Exercises: Other exercises Other Exercises Other Exercises: encouraged BUE/LE AROM @ bed level   Shoulder Instructions      Home Living Family/patient expects to be discharged to:: Private residence Living Arrangements: [place] (12; 16;18) Available Help at Discharge: Family;Available 24 hours/day Type  of Home: House Home Access: Stairs to enter [employer] of Steps: 6 Entrance Stairs-Rails: Right Home Layout: Two level;Bed/bath upstairs;1/2 bath on main level Alternate Level Stairs-Number of Steps: flight of stairs Alternate Level Stairs-Rails: Right Bathroom Shower/Tub: Tub/shower unit;Curtain   Bathroom Toilet: Standard Bathroom Accessibility: Yes How Accessible: Accessible via walker Home Equipment: Bedside commode          Prior Functioning/Environment Level of Independence: Independent;Needs assistance  Gait / Transfers Assistance Needed: walking without wihtout AD; prior to admission, pt was "holing on to her son" becuase she was in so much pain; was washing herself up after set up; was living downstairs becuase she was unable to navigate steps to bedroom     Comments: enjoys watching her nieces/nephews; reads - romance novels        OT Problem List: Decreased strength;Decreased activity tolerance;Decreased knowledge of use of DME or AE;Pain      OT Treatment/Interventions: Self-care/ADL training;Therapeutic exercise;Energy conservation;DME and/or AE instruction;Therapeutic activities;Patient/family education    OT Goals(Current goals can be found in the care plan section) Acute Rehab OT Goals Patient Stated Goal: to get better adn not have so much pain OT Goal Formulation: With patient Time For Goal Achievement: 07/15/20 Potential to Achieve Goals: Good  OT Frequency: Min 2X/week   Barriers to D/C:            Co-evaluation              AM-PAC OT "6 Clicks" Daily Activity     Outcome Measure Help from another person eating meals?: None Help from another person taking care of personal grooming?: A Little Help from another person toileting, which includes using toliet, bedpan, or urinal?: A Little Help from another person bathing (including washing, rinsing, drying)?: A Little Help from another person to put on and taking off regular upper  body clothing?: A Little Help from another person to put on and taking off regular lower body clothing?: A Little 6 Click Score: 19   End of Session Nurse Communication: Mobility status  Activity Tolerance: Patient limited by pain Patient left: in bed;with call bell/phone within reach;with bed alarm set;with SCD's reapplied  OT Visit Diagnosis: Unsteadiness on feet (R26.81);Muscle weakness (generalized) (M62.81);Pain Pain - part of body: Leg (back; abdomen)                Time: 0109-3235 OT Time Calculation (min): 31 min Charges:  OT General Charges $OT Visit: 1 Visit OT Evaluation $OT Eval Moderate Complexity: 1 Mod OT Treatments $Self Care/Home Management : 8-22 mins  Maurie Boettcher, OT/L   Acute OT Clinical Specialist [place] Pager [phone] Office [phone]   [facility] 07/01/2020, 9:53 AM

## 2020-07-01 NOTE — Progress Notes (Signed)
PT Cancellation Note  Patient Details Name: Candace Griffin MRN: [id number] DOB: 13-Apr-1976   Cancelled Treatment:    Reason Eval/Treat Not Completed: Other (comment) Pt reports recent emesis and needing to drink contrast.  Pt would like to hold therapy today and requests PT to check back tomorrow.    Lamarius Dirr,KATHrine E 07/01/2020, 10:42 AM Arlyce Dice, DPT Acute Rehabilitation Services Pager: [phone] Office: [phone]

## 2020-07-01 NOTE — Progress Notes (Signed)
Patient with only one IV, IV-team unable to get a 2nd line, patient's IVF and antibiotics are not compatible, Dr. Cathlean Sauer informed, MD stated to alternate and infuse IVF when the antibiotic is not infusing.

## 2020-07-01 NOTE — Progress Notes (Signed)
PROGRESS NOTE    Candace Griffin  PIR:[id number] DOB: Jul 30, 1976 DOA: 06/26/2020 PCP: Richarda Osmond, DO    Brief Narrative:  Mrs. Ikeda was admittedto the hospital with a working diagnosis of systemicinflammatoryresponse syndrome, possible pancreatitis/ acute hepatitis,(ruledout sepsis).  44 year old female with a past medical history for renal failure status post kidney transplant, newly diagnosed metastatic carcinoma, unknown primary. Presents with 4 days of intractable nausea, vomiting,abdominal pain, not able to tolerate liquids or oral medications. On her initial physical examination she was hypotensive, after fluid resuscitation her blood pressurewas108/87, heart rate 136, respiratory rate 18, oxygen saturation 100% on room air. She had dry mucous membranes, her lungswereclear to auscultation bilaterally, heart S1-S2, present, tachycardic, abdomen mildly distended, tender in the mid abdomen, no rebound or guarding, no lower extremity edema, no rashes.  Sodium 138, potassium 5.2, chloride 97, bicarb 17, glucose 168, BUN 38, creatinine 1.42, anion gap 24, alkaline phosphatase 646, lipase 56, AST 1461, ALT 586, lactic acid 10.3, white count 25.1, hemoglobin 11.9, hematocrit 39.4, platelets 205. CT of the abdomen with hepatic lesions. Small subcapsular hematoma inferiorly from recent liver biopsy. Bulky retroperitoneal and bilateral inguinal adenopathy. Transplant kidney in the left lower quadrant with unchanged fullness of the renal collecting system. Pancreas with no ductal dilatation or inflammation. Chest radiograph with no infiltrates.  Patient was initially placed on broad spectrum antibiotic therapy and received volume resuscitation.   Systemic inflammatory response syndrome triggered by pancreatitis, medication induced hepatitis.   Sepsis has been ruled out.  Liver enzymes, wbc and lactic acid are trending down. Patient with persistent abdominal  pain, nausea and vomiting.   Assessment & Plan:   Principal Problem:   Pancreatitis Active Problems:   GERD   Status post kidney transplant   Hypertension   Symptomatic anemia   Metastatic carcinoma to liver (HCC)   AKI (acute kidney injury) ([place])   Hepatitis   Generalized abdominal pain   Renal cell carcinoma ([place])   1.Severe systemic inflammatory response syndrome, suspect pancreatitis,hepatitis(sepsisruled out). Persistent abdominal pain, with nausea and vomiting, herwbc trending down to 21.9 and cultures continue with no growth. HR has been between 100 and 120 bpm, afebrile.   Tolerating soft diet. Continue with 10 mg oxycodone SR bid,  plus PRN morphine and oxycodone IR. Change metoclopramide for tid phenergan for better nausea control, and continue as needed zofran.  On pantoprazole and sucralfate for antiacid therapy. On benadryl as needed for itching.   Plan for contrast CT abdomen and pelvis today, depending on results will continue or discontinue antibiotic therapy, still no frank source of infection.   Mobility limited due to abdominal and back pain,.   2.Acute kidney injury likely prerenal/anion gap metabolic acidosis/lactic acidosis.Hyperkalemia/hypokelemia/ hypomagnesemia.renal function with serum cr down to 1,0 with K at 4,9 and bicarbonate at 19.  Tolerating well IV fluids with dextrose and LR at 50 ml per H, continue to have poor oral intake due to nausea and vomiting. Follow up on renal panel in am.  Avoid nephrotoxic medications or hypotension. She will get contrast today for abdominal pain workup.   3.Acute Hepatitis/Elevated liver enzymes/metastatic carcinoma of unknown primary/ drug induced hepatitis. US liver with no gallbladder stone or sonographic signs of acute cholecystitis. 1.8x1.5x1.5 cm hypoechoic lesion left lobe of the liver. Acute viral hepatitis negative. Acetaminophen level is 10. Holdingoncabozantinib, medication that  can produce liver toxicity including elevated liver enzymes and bilirubins.  Continue to improve liver profile, bilirubins T, continue to be elevated at 4,6. Continue  with supportive medical therapy and follow up liver enzymes in am.   4.Status post kidney transplant. Continue with supressive therapy with prednisone,mycophenolate and tacrolimus, with good toleration.   5. Anxiety.As neededlorazepam.  6. Chronic anemia normocytic/ malignancy related. Continue to be stable with Hgb at 8,5 and Hct at 28.1    Patient continue to be at high risk for worsening pain, nausea and liver enzymes.   Status is: Inpatient  Remains inpatient appropriate because:IV treatments appropriate due to intensity of illness or inability to take PO   Dispo: The patient is from: Home              Anticipated d/c is to: Home              Anticipated d/c date is: 3 days              Patient currently is not medically stable to d/c.   DVT prophylaxis:  enoxaparin   Code Status:   full  Family Communication:  No family at the bedside      Consultants:   Oncology    Antimicrobials:   Zosyn     Subjective: Patient continue to have abdominal pain, nausea and vomiting, only mild improvement but not yet back to baseline. Limited mobility due to back pain.   Objective: Vitals:   06/30/20 1945 07/01/20 0021 07/01/20 0418 07/01/20 1013  BP: 112/85 113/79 117/78 122/87  Pulse: (!) 108 (!) 123 (!) 122 (!) 109  Resp: 20 18 18  (!) 22  Temp: 97.8 F (36.6 C) 98 F (36.7 C) 97.8 F (36.6 C) 97.9 F (36.6 C)  TempSrc: Oral Oral Oral Axillary  SpO2: 100% 99% 100% 100%  Weight:      Height:        Intake/Output Summary (Last 24 hours) at 07/01/2020 1127 Last data filed at 07/01/2020 0300 Gross per 24 hour  Intake 232.14 ml  Output 400 ml  Net -167.86 ml   Filed Weights   06/26/20 1815  Weight: 57.3 kg    Examination:   General: Not dyspnea, deconditioned and in pain with  movement,.  Neurology: Awake and alert, non focal  E ENT: no pallor, no icterus, oral mucosa moist Cardiovascular: No JVD. S1-S2 present, rhythmic, no gallops, rubs, or murmurs. No lower extremity edema. Pulmonary: positive breath sounds bilaterally, Gastrointestinal. Abdomen soft, tender to deep palpation at the mid and right abdomen, no rebound or guarding. Skin. No rashes Musculoskeletal: no joint deformities     Data Reviewed: I have personally reviewed following labs and imaging studies  CBC: Recent Labs  Lab 06/26/20 1153 06/26/20 1153 06/27/20 0253 06/28/20 0528 06/29/20 0725 06/30/20 0652 07/01/20 0554  WBC 25.1*   < > 19.7* 35.0* 24.3* 24.4* 21.9*  NEUTROABS 21.5*  --   --  30.9* 20.2* 20.0* 17.8*  HGB 11.9*   < > 9.2* 9.6* 8.6* 8.8* 8.5*  HCT 39.4   < > 30.2* 31.3* 27.9* 28.5* 28.1*  MCV 85.8   < > 85.1 83.7 83.3 84.1 83.9  PLT 205   < > 154 187 140* 156 155   < > = values in this interval not displayed.   Basic Metabolic Panel: Recent Labs  Lab 06/26/20 1031 06/26/20 1153 06/27/20 0253 06/28/20 0528 06/29/20 0725 06/30/20 0652 07/01/20 0554  NA 140   < > 139 138 136 139 136  K 4.7   < > 5.1 3.6 3.2* 3.6 4.9  CL 98   < > 108 106  104 109 106  CO2 20*   < > 19* 20* 20* 21* 19*  GLUCOSE 175*   < > 181* 133* 146* 144* 102*  BUN 35*   < > 29* 27* 25* 23* 19  CREATININE 1.38*   < > 1.07* 1.24* 1.29* 1.27* 1.04*  CALCIUM 10.2   < > 8.0* 8.5* 8.4* 8.4* 8.3*  MG 2.2  --   --   --   --  1.8  --    < > = values in this interval not displayed.   GFR: Estimated Creatinine Clearance: 63.1 mL/min (A) (by C-G formula based on SCr of 1.04 mg/dL (H)). Liver Function Tests: Recent Labs  Lab 06/27/20 0253 06/28/20 0528 06/29/20 0725 06/30/20 0652 07/01/20 0554  AST 1,732* 1,129* 555* 267* 158*  ALT 659* 594* 400* 292* 228*  ALKPHOS 463* 514* 373* 354* 389*  BILITOT 4.4* 4.6* 3.9* 3.8* 4.6*  PROT 5.9* 6.3* 5.4* 5.4* 5.5*  ALBUMIN 2.4* 2.7* 2.3* 2.2* 2.2*    Recent Labs  Lab 06/26/20 1153 06/27/20 0253  LIPASE 56* 43   No results for input(s): AMMONIA in the last 168 hours. Coagulation Profile: Recent Labs  Lab 06/27/20 0926  INR 1.5*   Cardiac Enzymes: No results for input(s): CKTOTAL, CKMB, CKMBINDEX, TROPONINI in the last 168 hours. BNP (last 3 results) No results for input(s): PROBNP in the last 8760 hours. HbA1C: No results for input(s): HGBA1C in the last 72 hours. CBG: No results for input(s): GLUCAP in the last 168 hours. Lipid Profile: No results for input(s): CHOL, HDL, LDLCALC, TRIG, CHOLHDL, LDLDIRECT in the last 72 hours. Thyroid Function Tests: No results for input(s): TSH, T4TOTAL, FREET4, T3FREE, THYROIDAB in the last 72 hours. Anemia Panel: No results for input(s): VITAMINB12, FOLATE, FERRITIN, TIBC, IRON, RETICCTPCT in the last 72 hours.    Radiology Studies: I have reviewed all of the imaging during this hospital visit personally     Scheduled Meds: . enoxaparin (LOVENOX) injection  40 mg Subcutaneous QHS  . mouth rinse  15 mL Mouth Rinse BID  . mycophenolate  500 mg Oral BID  . NON FORMULARY 20 mg  20 mg Oral Daily  . oxyCODONE  10 mg Oral Q12H  . pantoprazole (PROTONIX) IV  40 mg Intravenous Q12H  . potassium chloride  40 mEq Oral Once  . predniSONE  5 mg Oral Q breakfast  . promethazine  25 mg Intravenous TID AC  . sucralfate  1 g Oral TID WC & HS  . Tacrolimus ER  4 mg Oral Daily   Continuous Infusions: . dextrose 5% lactated ringers Stopped (06/30/20 1037)  . piperacillin-tazobactam (ZOSYN)  IV 3.375 g (07/01/20 0835)     LOS: 5 days        Adebayo Ensminger Gerome Apley, MD

## 2020-07-01 NOTE — Progress Notes (Addendum)
HEMATOLOGY-ONCOLOGY PROGRESS NOTE  SUBJECTIVE: Still with intermittent abdominal pain.  She vomited just prior to my visit.  She states that she vomits about once a day.  No diarrhea reported.  Oncology History  Metastatic carcinoma to liver ([place])  05/30/2020 Initial Diagnosis   Metastatic carcinoma to liver ([place])   06/13/2020 -  Chemotherapy   The patient had dexamethasone (DECADRON) 4 MG tablet, 8 mg, Oral, Daily, 1 of 1 cycle, Start date: 06/10/2020, End date: -- palonosetron (ALOXI) injection 0.25 mg, 0.25 mg, Intravenous,  Once, 1 of 4 cycles Administration: 0.25 mg (06/13/2020) pegfilgrastim-jmdb (FULPHILA) injection 6 mg, 6 mg, Subcutaneous,  Once, 1 of 4 cycles Administration: 6 mg (06/15/2020) CARBOplatin (PARAPLATIN) 390 mg in sodium chloride 0.9 % 250 mL chemo infusion, 390 mg (78.9 % of original dose 491.5 mg), Intravenous,  Once, 1 of 4 cycles Dose modification:   (original dose 491.5 mg, Cycle 1) Administration: 390 mg (06/13/2020) fosaprepitant (EMEND) 150 mg in sodium chloride 0.9 % 145 mL IVPB, 150 mg, Intravenous,  Once, 1 of 4 cycles Administration: 150 mg (06/13/2020) PACLitaxel (TAXOL) 54 mg in sodium chloride 0.9 % 150 mL chemo infusion (> 80mg /m2), 30 mg/m2 = 54 mg (60 % of original dose 50 mg/m2), Intravenous,  Once, 1 of 4 cycles Dose modification: 50 mg/m2 (original dose 50 mg/m2, Cycle 1, Reason: Change in LFTs), 30 mg/m2 (original dose 50 mg/m2, Cycle 1, Reason: Change in LFTs) Administration: 54 mg (06/13/2020)  for chemotherapy treatment.    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 with unknown primary site [facility])  06/10/2020 Initial Diagnosis   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 with unknown primary site [facility])   06/13/2020 -  Chemotherapy   The patient had dexamethasone (DECADRON) 4 MG tablet, 8 mg, Oral, Daily, 1 of 1 cycle, Start date: 06/10/2020, End date: -- palonosetron (ALOXI) injection 0.25 mg, 0.25 mg, Intravenous,   Once, 1 of 4 cycles Administration: 0.25 mg (06/13/2020) pegfilgrastim-jmdb (FULPHILA) injection 6 mg, 6 mg, Subcutaneous,  Once, 1 of 4 cycles Administration: 6 mg (06/15/2020) CARBOplatin (PARAPLATIN) 390 mg in sodium chloride 0.9 % 250 mL chemo infusion, 390 mg (78.9 % of original dose 491.5 mg), Intravenous,  Once, 1 of 4 cycles Dose modification:   (original dose 491.5 mg, Cycle 1) Administration: 390 mg (06/13/2020) fosaprepitant (EMEND) 150 mg in sodium chloride 0.9 % 145 mL IVPB, 150 mg, Intravenous,  Once, 1 of 4 cycles Administration: 150 mg (06/13/2020) PACLitaxel (TAXOL) 54 mg in sodium chloride 0.9 % 150 mL chemo infusion (> 80mg /m2), 30 mg/m2 = 54 mg (60 % of original dose 50 mg/m2), Intravenous,  Once, 1 of 4 cycles Dose modification: 50 mg/m2 (original dose 50 mg/m2, Cycle 1, Reason: Change in LFTs), 30 mg/m2 (original dose 50 mg/m2, Cycle 1, Reason: Change in LFTs) Administration: 54 mg (06/13/2020)  for chemotherapy treatment.       REVIEW OF SYSTEMS:   Constitutional: Denies fevers, chills Respiratory: Denies cough, dyspnea or wheezes Cardiovascular: Denies palpitation, chest discomfort Gastrointestinal: Has ongoing abdominal discomfort, reports that she vomits about once a day Skin: Denies abnormal skin rashes Lymphatics: Denies new lymphadenopathy or easy bruising Neurological:Denies numbness, tingling or new weaknesses Behavioral/Psych: Mood is stable, no new changes  Extremities: No lower extremity edema All other systems were reviewed with the patient and are negative.  I have reviewed the past medical history, past surgical history, social history and family history with the patient and they are unchanged from previous note.  PHYSICAL EXAMINATION: ECOG PERFORMANCE STATUS: 2 - Symptomatic, <50% confined to bed  Vitals:   07/01/20 0418 07/01/20 1013  BP: 117/78 122/87  Pulse: (!) 122 (!) 109  Resp: 18 (!) 22  Temp: 97.8 F (36.6 C) 97.9 F (36.6 C)   SpO2: 100% 100%   Filed Weights   06/26/20 1815  Weight: 57.3 kg    Intake/Output from previous day: 11/07 0701 - 11/08 0700 In: 1855.5 [I.V.:1469.6; IV Piggyback:385.9] Out: 400 [Urine:300; Emesis/NG output:100]  GENERAL:alert, no distress and comfortable SKIN: skin color, texture, turgor are normal, no rashes or significant lesions EYES: Scleral icterus present OROPHARYNX: Mucositis noted in the posterior pharynx LUNGS: clear to auscultation and percussion with normal breathing effort HEART: regular rate & rhythm and no murmurs and no lower extremity edema ABDOMEN:abdomen soft, non-tender and normal bowel sounds Musculoskeletal:no cyanosis of digits and no clubbing  NEURO: alert & oriented x 3 with fluent speech, no focal motor/sensory deficits  LABORATORY DATA:  I have reviewed the data as listed CMP Latest Ref Rng & Units 07/01/2020 06/30/2020 06/29/2020  Glucose 70 - 99 mg/dL 102(H) 144(H) 146(H)  BUN 6 - 20 mg/dL 19 23(H) 25(H)  Creatinine 0.44 - 1.00 mg/dL 1.04(H) 1.27(H) 1.29(H)  Sodium 135 - 145 mmol/L 136 139 136  Potassium 3.5 - 5.1 mmol/L 4.9 3.6 3.2(L)  Chloride 98 - 111 mmol/L 106 109 104  CO2 22 - 32 mmol/L 19(L) 21(L) 20(L)  Calcium 8.9 - 10.3 mg/dL 8.3(L) 8.4(L) 8.4(L)  Total Protein 6.5 - 8.1 g/dL 5.5(L) 5.4(L) 5.4(L)  Total Bilirubin 0.3 - 1.2 mg/dL 4.6(H) 3.8(H) 3.9(H)  Alkaline Phos 38 - 126 U/L 389(H) 354(H) 373(H)  AST 15 - 41 U/L 158(H) 267(H) 555(H)  ALT 0 - 44 U/L 228(H) 292(H) 400(H)    Lab Results  Component Value Date   WBC 21.9 (H) 07/01/2020   HGB 8.5 (L) 07/01/2020   HCT 28.1 (L) 07/01/2020   MCV 83.9 07/01/2020   PLT 155 07/01/2020   NEUTROABS 17.8 (H) 07/01/2020    CT ABDOMEN PELVIS WO CONTRAST  Result Date: 06/26/2020 CLINICAL DATA:  Diffuse abdominal pain and vomiting. History of cancer and renal transplant. Liver mass post recent liver biopsy. EXAM: CT ABDOMEN AND PELVIS WITHOUT CONTRAST TECHNIQUE: Multidetector CT imaging of the  abdomen and pelvis was performed following the standard protocol without IV contrast. COMPARISON:  Contrast-enhanced exam 05/27/2020. FINDINGS: Lower chest: No focal airspace disease, pleural fluid, or pulmonary nodularity. Heart is normal in size. Hepatobiliary: Patient's known hepatic lesions are not well demonstrated on noncontrast exam. Liver parenchyma is heterogeneous. There may be a small subcapsular hematoma inferiorly from recent liver biopsy, not well-defined on this noncontrast exam. The gallbladder is present. There is high-density material in the gallbladder lumen, possibly sludge. No calcified gallstone. Common bile duct is poorly defined, but no evidence of biliary dilatation. Pancreas: No ductal dilatation or inflammation. Spleen: Normal in size without focal abnormality. Adrenals/Urinary Tract: Normal adrenal glands. Chronic bilateral native renal atrophy. Solid-appearing lesion in the lower left renal hilum was better appreciated on prior contrast-enhanced CT, grossly unchanged, series 2, image 35. Transplant kidney in the left lower quadrant. Scarring in the left lower pole is better appreciated on prior contrast-enhanced exam. Similar fullness of the renal collecting system without frank hydronephrosis. No transplant perinephric edema. Sequela of failed transplant in the right iliac fossa. The urinary bladder is unremarkable. Stomach/Bowel: Bowel evaluation is limited in the absence of enteric contrast. There is no bowel obstruction or evidence  of bowel inflammation. The appendix is normal. Vascular/Lymphatic: Bulky retroperitoneal adenopathy, some of which is low-density and typical of necrosis. Occasional areas of nodal calcification. Adenopathy extends from the retrocrural space to the bilateral common iliac arteries, and left external iliac station. There also enlarged bilateral inguinal lymph nodes, left greater than right. Index aortocaval node measures 4.1 x 2.4 cm, previously 4.2 x 2.6  cm. Overall adenopathy is not significantly changed in the interim allowing for noncontrast technique. There is aortic and branch atherosclerosis. Reproductive: Bulky uterus with fibroids.  No obvious adnexal mass. Other: There is trace free fluid in the pelvis, slightly increased from prior exam. No other free fluid or ascites. No free air. Musculoskeletal: Ill-defined sclerosis involving right L2 vertebral body, nonspecific but unchanged from previous. Sclerotic density in the left acetabulum. There is hemi transitional lumbosacral anatomy. No fracture or acute osseous abnormality. IMPRESSION: 1. Patient's known hepatic lesions are not well-defined on this noncontrast exam. There may be a small subcapsular hematoma inferiorly from recent liver biopsy, not well-defined. No evidence of hemoperitoneum. 2. Bulky retroperitoneal and bilateral inguinal adenopathy, grossly unchanged from prior. 3. Transplant kidney in the left lower quadrant with unchanged fullness of the renal collecting system. Sequela of failed transplant in the right iliac fossa. Stable soft tissue density in the region of the native left kidney lower hilum. 4. Bulky uterus with fibroids. 5. Nonspecific sclerosis involving the right aspect of L2 vertebral body. Aortic Atherosclerosis (ICD10-I70.0). Electronically Signed   By: Keith Rake M.D.   On: 06/26/2020 16:45   MR Brain W Wo Contrast  Result Date: 06/28/2020 CLINICAL DATA:  Urologic cancer, staging EXAM: MRI HEAD WITHOUT AND WITH CONTRAST TECHNIQUE: Multiplanar, multiecho pulse sequences of the brain and surrounding structures were obtained without and with intravenous contrast. CONTRAST:  75mL GADAVIST GADOBUTROL 1 MMOL/ML IV SOLN COMPARISON:  MRI head 11/17/2016 FINDINGS: Brain: No acute infarction, hemorrhage, hydrocephalus, extra-axial collection or mass lesion. Normal white matter. Negative for metastatic disease to the brain. No enhancing mass lesion in the brain. Vascular: Normal  arterial flow voids. Skull and upper cervical spine: Enhancing lesion in the lower body of the C2 vertebral body. This shows decreased signal prior to contrast and is consistent with metastatic disease. No fracture or cord compression. No skull lesion identified. Benign fatty lesion in the left parietal bone. Sinuses/Orbits: Paranasal sinuses clear.  Negative orbit Other: None IMPRESSION: Negative for metastatic disease to the brain 1 cm enhancing lesion C2 vertebral body compatible with metastatic disease. No fracture or cord compression Electronically Signed   By: Franchot Gallo M.D.   On: 06/28/2020 21:31   DG Chest Port 1 View  Result Date: 06/26/2020 CLINICAL DATA:  Weakness EXAM: PORTABLE CHEST 1 VIEW COMPARISON:  05/26/2020 FINDINGS: The heart size and mediastinal contours are within normal limits. Both lungs are clear. No pleural effusion or pneumothorax. The visualized skeletal structures are unremarkable. IMPRESSION: No acute process in the chest. Electronically Signed   By: Macy Mis M.D.   On: 06/26/2020 13:45   US Abdomen Limited RUQ (LIVER/GB)  Result Date: 06/27/2020 CLINICAL DATA:  Abdominal pain.  Known liver lesions. EXAM: ULTRASOUND ABDOMEN LIMITED RIGHT UPPER QUADRANT COMPARISON:  Noncontrast abdominal CT yesterday. Contrast-enhanced abdominal CT 05/27/2020 FINDINGS: Gallbladder: Physiologically distended containing intraluminal sludge. No shadowing stones. No wall thickening. No sonographic Murphy sign noted by sonographer. Common bile duct: Diameter: 4-5 mm. Liver: Heterogeneous liver. Many of the liver lesions on prior contrast-enhanced CT are not well seen by ultrasound. There  is a 1.8 x 1.5 x 1.5 cm complex hypoechoic lesion in the left lobe. No convincing subcapsular collection is suggested on CT. Portal vein is patent on color Doppler imaging with normal direction of blood flow towards the liver. Other: No right upper quadrant ascites. IMPRESSION: 1. Gallbladder sludge. No  gallstones or sonographic findings of acute cholecystitis. 2. No biliary dilatation. 3. Heterogeneous liver with 1.8 cm hypoechoic lesion in the left lobe of the liver. Many of the additional liver lesions on prior contrast-enhanced CT are not well seen by ultrasound. 4. No sonographic evidence of subcapsular collection is suggested on CT. Electronically Signed   By: Keith Rake M.D.   On: 06/27/2020 17:44    ASSESSMENT: 44 yo with   1) Metastatic carcinoma of unknown primary Patient with h/o renal transplantation on tacrolimus, prednisone and cellcept chronic immunosuppression with extensive metastatic disease with extensive abd/pelvic LNadenopathy and hepatic metastases.  IHC from LN and liver metastases -- non specific morphology and IC ? Gyn vs renal tubular origin. Tumor markers unrevealing Component     Latest Ref Rng & Units 05/29/2020 05/31/2020  CEA     0.0 - 4.7 ng/mL 0.9   AFP, Serum, Tumor Marker     0.0 - 8.3 ng/mL 1.7   CA 19-9     0 - 35 U/mL 24   Cancer Antigen (CA) 125     0.0 - 38.1 U/mL 20.1   hCG Quant     mIU/mL 3   LDH     98 - 192 U/L  178    2) severe systemic inflammatory response syndrome  3) transaminitis and hyperbilirubinemia  4) AKI with hyperkalemia, improved    PLAN: -Remains on empiric antibiotics per hospitalist service.  Cultures unrevealing so far.  Hospitalist repeating CT of the abdomen and pelvis today and planning to discontinue antibiotics if no source of infection is identified. -Cancer-ID was sent and results indicate cancer is likely renal in origin.  We plan to change chemotherapy to Cabometyx at a reduced dose of 20 mg daily.  I have communicated with our outpatient chemotherapy pharmacist who will obtain this medication from the [facility].  I have placed an order to begin this medication once it is available. -LFTs trending downward. ?due to hypotension/sepsis/transplant meds.  Continue to monitor  closely. -Recommend nephrology consultation for management of her immunosuppressive drugs.  May need dose adjustment due to her worsening hepatic function. -MRI of the brain has been obtained which was negative for metastatic disease to the brain.  It did show a 1 cm enhancing lesion at the C2 vertebral body compatible with metastatic disease.   LOS: 5 days   Mikey Bussing, DNP, AGPCNP-BC, AOCNP 07/01/20  ADDENDUM  .Patient was Personally and independently interviewed, examined and relevant elements of the history of present illness were reviewed in details and an assessment and plan was created. All elements of the patient's history of present illness , assessment and plan were discussed in details with Mikey Bussing, DNP, AGPCNP-BC, AOCNP. The above documentation reflects our combined findings assessment and plan.  Sullivan Lone MD MS

## 2020-07-01 NOTE — Care Management Important Message (Signed)
Important Message  Patient Details IM Letter given to the Patient Name: Candace Griffin MRN: [id number] Date of Birth: Mar 17, 1976   Medicare Important Message Given:  Yes     [name] 07/01/2020, 12:22 PM

## 2020-07-02 DIAGNOSIS — C787 Secondary malignant neoplasm of liver and intrahepatic bile duct: Secondary | ICD-10-CM | POA: Diagnosis not present

## 2020-07-02 DIAGNOSIS — K759 Inflammatory liver disease, unspecified: Secondary | ICD-10-CM | POA: Diagnosis not present

## 2020-07-02 DIAGNOSIS — N179 Acute kidney failure, unspecified: Secondary | ICD-10-CM | POA: Diagnosis not present

## 2020-07-02 DIAGNOSIS — C649 Malignant neoplasm of unspecified kidney, except renal pelvis: Secondary | ICD-10-CM | POA: Diagnosis not present

## 2020-07-02 DIAGNOSIS — K219 Gastro-esophageal reflux disease without esophagitis: Secondary | ICD-10-CM | POA: Diagnosis not present

## 2020-07-02 DIAGNOSIS — K859 Acute pancreatitis without necrosis or infection, unspecified: Secondary | ICD-10-CM | POA: Diagnosis not present

## 2020-07-02 MED ORDER — OXYCODONE HCL ER 15 MG PO T12A
15.0000 mg | EXTENDED_RELEASE_TABLET | Freq: Two times a day (BID) | ORAL | Status: DC
  Administered 2020-07-02 – 2020-07-05 (×7): 15 mg via ORAL
  Filled 2020-07-02 (×7): qty 1

## 2020-07-02 MED ORDER — SODIUM CHLORIDE 0.9% FLUSH: 10.0000 mL | INTRAVENOUS | Status: DC | PRN

## 2020-07-02 MED ORDER — SODIUM CHLORIDE 0.9% FLUSH
10.0000 mL | Freq: Two times a day (BID) | INTRAVENOUS | Status: DC
  Administered 2020-07-06 – 2020-07-12 (×6): 10 mL
  Administered 2020-07-12: 15 mL
  Administered 2020-07-13: 10 mL
  Administered 2020-07-13: 30 mL
  Administered 2020-07-14 (×2): 10 mL
  Administered 2020-07-15: 40 mL
  Administered 2020-07-15 – 2020-07-16 (×2): 10 mL

## 2020-07-02 NOTE — Progress Notes (Addendum)
PROGRESS NOTE    Candace Griffin  HYQ:[id number] DOB: [birth year] DOA: 06/26/2020 PCP: Richarda Osmond, DO    Brief Narrative:  Candace Griffin was admittedto the hospital with a working diagnosis of systemicinflammatoryresponse syndrome, possible pancreatitis/ acute hepatitis,(ruledout sepsis).  44 year old female with a past medical history for renal failure status post kidney transplant, newly diagnosed metastatic carcinoma, likely renal in origin. Presents with 4 days of intractable nausea, vomiting,abdominal pain, not able to tolerate liquids or oral medications. On her initial physical examination she was hypotensive, after fluid resuscitation her blood pressurewas108/87, heart rate 136, respiratory rate 18, oxygen saturation 100% on room air. She had dry mucous membranes, her lungswereclear to auscultation bilaterally, heart S1-S2, present, tachycardic, abdomen mildly distended, tender in the mid abdomen, no rebound or guarding, no lower extremity edema, no rashes.  Sodium 138, potassium 5.2, chloride 97, bicarb 17, glucose 168, BUN 38, creatinine 1.42, anion gap 24, alkaline phosphatase 646, lipase 56, AST 1461, ALT 586, lactic acid 10.3, white count 25.1, hemoglobin 11.9, hematocrit 39.4, platelets 205. CT of the abdomen with hepatic lesions. Small subcapsular hematoma inferiorly from recent liver biopsy. Bulky retroperitoneal and bilateral inguinal adenopathy. Transplant kidney in the left lower quadrant with unchanged fullness of the renal collecting system. Pancreas with no ductal dilatation or inflammation. Chest radiograph with no infiltrates.  Patient was initially placed on broad spectrum antibiotic therapy and received volume resuscitation.   Systemic inflammatory response syndrome triggered by pancreatitis, and possible medication induced hepatitis.   Sepsis has been ruled out.  Liver enzymes, wbc and lactic acid are trending down. Patient with  persistent abdominal pain, nausea and vomiting.    Assessment & Plan:   Principal Problem:   Pancreatitis Active Problems:   GERD   Status post kidney transplant   Hypertension   Symptomatic anemia   Metastatic carcinoma to liver (HCC)   AKI (acute kidney injury) ([place])   Hepatitis   Generalized abdominal pain   Renal cell carcinoma ([place])    1.Severe systemic inflammatory response syndrome, suspect pancreatitis,hepatitis(sepsisruled out). Continue to have persistent abdominal pain, associated with nausea and vomiting. Blood work is still pending from this am. Continue to be tachycardic, 116 bpm, likely due to pain.   Partially tolerating softdiet.  Plan to increase to 15 mg oxycodone SR bid, and continue as needed morphine and oxycodone IR. Continue with tid phenergan premeals and as needed zofran.  Continue with  pantoprazole andsucralfate for antiacid therapy.  Tolerating well benadryl as needed for itching.   Contrast CT abdomen and pelvis no occult infection or abscess, will hold on antibiotic therapy for now and continue to monitor on wbc, cultures and temperature curve.   2.Acute kidney injury likely prerenal/anion gap metabolic acidosis/lactic acidosis.Hyperkalemia/hypokelemia/ hypomagnesemia. Pending renal function from this am,   Continue with IV fluids: dextrose and LR at 50 ml per H.  Avoid nephrotoxic medications or hypotension. Follow up renal panel in am, patient continue with very poor oral intake.   3.Acute Hepatitis/Elevated liver enzymes/metastatic carcinoma likely renal  primary/ drug induced hepatitis. US liver with no gallbladder stone or sonographic signs of acute cholecystitis. 1.8x1.5x1.5 cm hypoechoic lesion left lobe of the liver. Acute viral hepatitis negative. Acetaminophen level is 10. Holdingoncabozantinib, medication that can produce liver toxicity including elevated liver enzymes and bilirubins.  Pending liver  panel this am, continue close monitoring. Doubt transplant medications are the culprit.   Follow with oncology recommendations for further therapy.   4.Status post kidney transplant. Continue with prednisone,mycophenolate  and tacrolimus.  I spoke with Dr. Aquilla Hacker from renal transplant at [facility], plan to continue current immunosuppressive regimen for now, check a tacrolimus trough level in am. Plan to call the transfer center [phone] with the results.   Considering likely renal primary, it may be needed to change regimen, further discussion with Oncology. Will give contact information to the oncology team.    5. Anxiety. Continue with as neededlorazepam.  6. Chronic anemia normocytic/ malignancy related.  cell count has been stable, no current indication for transfusion.    Status is: Inpatient  Remains inpatient appropriate because:IV treatments appropriate due to intensity of illness or inability to take PO   Dispo: The patient is from: Home              Anticipated d/c is to: Home              Anticipated d/c date is: 3 days              Patient currently is not medically stable to d/c.    DVT prophylaxis: Enoxaparin   Code Status:   full  Family Communication:  No family at the bedside, she has been communicating with her family over the phone.       Consultants:   Oncology     Subjective: Patient continue to have abdominal pain constant, dull in nature, more mid abdomen and right upper quadrant, associated with nausea and vomiting, poor oral intake, limited mobility due to pain. Positive bowel movements.   Objective: Vitals:   07/01/20 1802 07/01/20 2206 07/02/20 0210 07/02/20 0628  BP: 112/88 110/79 (!) 125/97 (!) 126/95  Pulse: (!) 109 (!) 120 (!) 122 (!) 116  Resp: 18 16 17 16   Temp: 98.1 F (36.7 C) 97.7 F (36.5 C) 98.6 F (37 C) 98 F (36.7 C)  TempSrc:  Oral Oral Oral  SpO2: 100% 100% 100% 100%  Weight:      Height:         Intake/Output Summary (Last 24 hours) at 07/02/2020 1241 Last data filed at 07/02/2020 0911 Gross per 24 hour  Intake 422.68 ml  Output 1090 ml  Net -667.32 ml   Filed Weights   06/26/20 1815  Weight: 57.3 kg    Examination:   General: deconditioned and ill looking appearing  Neurology: Awake and alert, non focal  E ENT: mild pallor, mild icterus, oral mucosa moist Cardiovascular: No JVD. S1-S2 present, rhythmic, no gallops, rubs, or murmurs. No lower extremity edema. Pulmonary: positive breath sounds bilaterally, with no wheezing, rhonchi or rales. Gastrointestinal. Abdomen mild distended, tender to palpation at the right upper quadrant and mid abdomen. No rebound.  Skin. No rashes Musculoskeletal: no joint deformities     Data Reviewed: I have personally reviewed following labs and imaging studies  CBC: Recent Labs  Lab 06/26/20 1153 06/26/20 1153 06/27/20 0253 06/28/20 0528 06/29/20 0725 06/30/20 0652 07/01/20 0554  WBC 25.1*   < > 19.7* 35.0* 24.3* 24.4* 21.9*  NEUTROABS 21.5*  --   --  30.9* 20.2* 20.0* 17.8*  HGB 11.9*   < > 9.2* 9.6* 8.6* 8.8* 8.5*  HCT 39.4   < > 30.2* 31.3* 27.9* 28.5* 28.1*  MCV 85.8   < > 85.1 83.7 83.3 84.1 83.9  PLT 205   < > 154 187 140* 156 155   < > = values in this interval not displayed.   Basic Metabolic Panel: Recent Labs  Lab 06/26/20 1031 06/26/20 1153 06/27/20 0253  06/28/20 0528 06/29/20 0725 06/30/20 0652 07/01/20 0554  NA 140   < > 139 138 136 139 136  K 4.7   < > 5.1 3.6 3.2* 3.6 4.9  CL 98   < > 108 106 104 109 106  CO2 20*   < > 19* 20* 20* 21* 19*  GLUCOSE 175*   < > 181* 133* 146* 144* 102*  BUN 35*   < > 29* 27* 25* 23* 19  CREATININE 1.38*   < > 1.07* 1.24* 1.29* 1.27* 1.04*  CALCIUM 10.2   < > 8.0* 8.5* 8.4* 8.4* 8.3*  MG 2.2  --   --   --   --  1.8  --    < > = values in this interval not displayed.   GFR: Estimated Creatinine Clearance: 63.1 mL/min (A) (by C-G formula based on SCr of 1.04 mg/dL  (H)). Liver Function Tests: Recent Labs  Lab 06/27/20 0253 06/28/20 0528 06/29/20 0725 06/30/20 0652 07/01/20 0554  AST 1,732* 1,129* 555* 267* 158*  ALT 659* 594* 400* 292* 228*  ALKPHOS 463* 514* 373* 354* 389*  BILITOT 4.4* 4.6* 3.9* 3.8* 4.6*  PROT 5.9* 6.3* 5.4* 5.4* 5.5*  ALBUMIN 2.4* 2.7* 2.3* 2.2* 2.2*   Recent Labs  Lab 06/26/20 1153 06/27/20 0253  LIPASE 56* 43   No results for input(s): AMMONIA in the last 168 hours. Coagulation Profile: Recent Labs  Lab 06/27/20 0926  INR 1.5*   Cardiac Enzymes: No results for input(s): CKTOTAL, CKMB, CKMBINDEX, TROPONINI in the last 168 hours. BNP (last 3 results) No results for input(s): PROBNP in the last 8760 hours. HbA1C: No results for input(s): HGBA1C in the last 72 hours. CBG: No results for input(s): GLUCAP in the last 168 hours. Lipid Profile: No results for input(s): CHOL, HDL, LDLCALC, TRIG, CHOLHDL, LDLDIRECT in the last 72 hours. Thyroid Function Tests: No results for input(s): TSH, T4TOTAL, FREET4, T3FREE, THYROIDAB in the last 72 hours. Anemia Panel: No results for input(s): VITAMINB12, FOLATE, FERRITIN, TIBC, IRON, RETICCTPCT in the last 72 hours.    Radiology Studies: I have reviewed all of the imaging during this hospital visit personally     Scheduled Meds: . cabozantinib  20 mg Oral Daily  . enoxaparin (LOVENOX) injection  40 mg Subcutaneous QHS  . mouth rinse  15 mL Mouth Rinse BID  . mycophenolate  500 mg Oral BID  . oxyCODONE  15 mg Oral BID  . pantoprazole (PROTONIX) IV  40 mg Intravenous Q12H  . predniSONE  5 mg Oral Q breakfast  . promethazine  12.5 mg Intravenous TID AC  . sucralfate  1 g Oral TID WC & HS  . Tacrolimus ER  4 mg Oral Daily   Continuous Infusions: . dextrose 5% lactated ringers 50 mL/hr at 07/02/20 0910     LOS: 6 days        Alison Kubicki Gerome Apley, MD

## 2020-07-02 NOTE — Telephone Encounter (Signed)
Oral Chemotherapy Pharmacist Encounter  I met with patient for overview of: Cabometyx for the treatment of metastatic renal cell carcinoma, planned duration until disease progression or unacceptable toxicity.   Counseled patient on administration, dosing, side effects, monitoring, drug-food interactions, safe handling, storage, and disposal.  Patient will take Cabometyx 20mg tablets, 1 tablet (20mg) by mouth once daily on an empty stomach, 1 hour before or 2 hours after a meal.  Given concern for liver function, patient started on lowest dose since medication is hepatically metabolized - pending patient's tolerance to medication, MD may titrate dose up.  Patient knows to avoid grapefruit and grapefruit juice.  Cabometyx start date: 07/01/2020  Adverse effects include but are not limited to: diarrhea, nausea, decreased appetite, fatigue, hypertension, hand-foot syndrome, decreased blood counts, and electrolyte abnormalities. Patient will obtain anti diarrheal and alert the office of 4 or more loose stools above baseline.  Patient informed that Cabometyx should be held at least 3 weeks prior to any scheduled surgery (including dental surgery) and not resumed until at least 2 weeks after major surgery and until adequate wound healing is established.  Reviewed with patient importance of keeping a medication schedule and plan for any missed doses. No barriers to medication adherence identified.  Medication reconciliation performed and medication/allergy list updated.  Insurance authorization for Cabometyx has been obtained. Test claim at the pharmacy revealed copayment $4 for 1st fill of Cabometyx.  Patient informed the pharmacy will reach out 5-7 days prior to needing next fill of Cabometyx to coordinate continued medication acquisition to prevent break in therapy.  All questions answered.  Candace Griffin voiced understanding and appreciation.   Medication education handout placed in mail  for patient. Patient knows to call the office with questions or concerns. [facility] phone number provided to patient.   Rebecca Fanning, PharmD, BCPS Hematology/Oncology Clinical Pharmacist [facility] [phone] 07/02/2020 1:41 PM  

## 2020-07-02 NOTE — Progress Notes (Signed)
PT Cancellation Note  Patient Details Name: TAMORA HUNEKE MRN: [id number] DOB: 1976/01/10   Cancelled Treatment:    Reason Eval/Treat Not Completed: Medical issues which prohibited therapy;Pain limiting ability to participate (pt reports nausea and abdominal pain at present. Nausea medication requested. Will follow.)   Philomena Doheny PT 07/02/2020  Acute Rehabilitation Services Pager [phone] Office [phone]

## 2020-07-02 NOTE — Plan of Care (Signed)

## 2020-07-02 NOTE — Progress Notes (Addendum)
HEMATOLOGY-ONCOLOGY PROGRESS NOTE  SUBJECTIVE: Continues to report abdominal pain.  She vomited earlier today.  Bowels moving.  Started on Cabometyx 20 mg daily yesterday.  Oncology History  Metastatic carcinoma to liver ([place])  05/30/2020 Initial Diagnosis   Metastatic carcinoma to liver ([place])   06/13/2020 -  Chemotherapy   The patient had dexamethasone (DECADRON) 4 MG tablet, 8 mg, Oral, Daily, 1 of 1 cycle, Start date: 06/10/2020, End date: -- palonosetron (ALOXI) injection 0.25 mg, 0.25 mg, Intravenous,  Once, 1 of 4 cycles Administration: 0.25 mg (06/13/2020) pegfilgrastim-jmdb (FULPHILA) injection 6 mg, 6 mg, Subcutaneous,  Once, 1 of 4 cycles Administration: 6 mg (06/15/2020) CARBOplatin (PARAPLATIN) 390 mg in sodium chloride 0.9 % 250 mL chemo infusion, 390 mg (78.9 % of original dose 491.5 mg), Intravenous,  Once, 1 of 4 cycles Dose modification:   (original dose 491.5 mg, Cycle 1) Administration: 390 mg (06/13/2020) fosaprepitant (EMEND) 150 mg in sodium chloride 0.9 % 145 mL IVPB, 150 mg, Intravenous,  Once, 1 of 4 cycles Administration: 150 mg (06/13/2020) PACLitaxel (TAXOL) 54 mg in sodium chloride 0.9 % 150 mL chemo infusion (> 80mg /m2), 30 mg/m2 = 54 mg (60 % of original dose 50 mg/m2), Intravenous,  Once, 1 of 4 cycles Dose modification: 50 mg/m2 (original dose 50 mg/m2, Cycle 1, Reason: Change in LFTs), 30 mg/m2 (original dose 50 mg/m2, Cycle 1, Reason: Change in LFTs) Administration: 54 mg (06/13/2020)  for chemotherapy treatment.    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 with unknown primary site [facility])  06/10/2020 Initial Diagnosis   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 with unknown primary site [facility])   06/13/2020 -  Chemotherapy   The patient had dexamethasone (DECADRON) 4 MG tablet, 8 mg, Oral, Daily, 1 of 1 cycle, Start date: 06/10/2020, End date: -- palonosetron (ALOXI) injection 0.25 mg, 0.25 mg, Intravenous,  Once, 1 of 4  cycles Administration: 0.25 mg (06/13/2020) pegfilgrastim-jmdb (FULPHILA) injection 6 mg, 6 mg, Subcutaneous,  Once, 1 of 4 cycles Administration: 6 mg (06/15/2020) CARBOplatin (PARAPLATIN) 390 mg in sodium chloride 0.9 % 250 mL chemo infusion, 390 mg (78.9 % of original dose 491.5 mg), Intravenous,  Once, 1 of 4 cycles Dose modification:   (original dose 491.5 mg, Cycle 1) Administration: 390 mg (06/13/2020) fosaprepitant (EMEND) 150 mg in sodium chloride 0.9 % 145 mL IVPB, 150 mg, Intravenous,  Once, 1 of 4 cycles Administration: 150 mg (06/13/2020) PACLitaxel (TAXOL) 54 mg in sodium chloride 0.9 % 150 mL chemo infusion (> 80mg /m2), 30 mg/m2 = 54 mg (60 % of original dose 50 mg/m2), Intravenous,  Once, 1 of 4 cycles Dose modification: 50 mg/m2 (original dose 50 mg/m2, Cycle 1, Reason: Change in LFTs), 30 mg/m2 (original dose 50 mg/m2, Cycle 1, Reason: Change in LFTs) Administration: 54 mg (06/13/2020)  for chemotherapy treatment.       REVIEW OF SYSTEMS:   Constitutional: Denies fevers, chills Respiratory: Denies cough, dyspnea or wheezes Cardiovascular: Denies palpitation, chest discomfort Gastrointestinal: Reports abdominal pain and still has vomiting about once a day Skin: Denies abnormal skin rashes Lymphatics: Denies new lymphadenopathy or easy bruising Neurological:Denies numbness, tingling or new weaknesses Behavioral/Psych: Mood is stable, no new changes  Extremities: No lower extremity edema All other systems were reviewed with the patient and are negative.  I have reviewed the past medical history, past surgical history, social history and family history with the patient and they are unchanged from previous note.   PHYSICAL EXAMINATION: ECOG PERFORMANCE STATUS: 2 -  Symptomatic, <50% confined to bed  Vitals:   07/02/20 0628 07/02/20 1254  BP: (!) 126/95 (!) 130/101  Pulse: (!) 116 (!) 120  Resp: 16 (!) 25  Temp: 98 F (36.7 C) 97.6 F (36.4 C)  SpO2: 100% 100%    Filed Weights   06/26/20 1815  Weight: 57.3 kg    Intake/Output from previous day: 11/08 0701 - 11/09 0700 In: 302.7 [I.V.:133; IV Piggyback:169.7] Out: 1090 [Urine:1090]  GENERAL:alert, no distress and comfortable SKIN: skin color, texture, turgor are normal, no rashes or significant lesions EYES: Scleral icterus present OROPHARYNX: Mucositis noted in the posterior pharynx LUNGS: clear to auscultation and percussion with normal breathing effort HEART: regular rate & rhythm and no murmurs and no lower extremity edema ABDOMEN: Positive bowel sounds, soft, tenderness with light palpation over the upper abdomen Musculoskeletal:no cyanosis of digits and no clubbing  NEURO: alert & oriented x 3 with fluent speech, no focal motor/sensory deficits  LABORATORY DATA:  I have reviewed the data as listed CMP Latest Ref Rng & Units 07/01/2020 06/30/2020 06/29/2020  Glucose 70 - 99 mg/dL 102(H) 144(H) 146(H)  BUN 6 - 20 mg/dL 19 23(H) 25(H)  Creatinine 0.44 - 1.00 mg/dL 1.04(H) 1.27(H) 1.29(H)  Sodium 135 - 145 mmol/L 136 139 136  Potassium 3.5 - 5.1 mmol/L 4.9 3.6 3.2(L)  Chloride 98 - 111 mmol/L 106 109 104  CO2 22 - 32 mmol/L 19(L) 21(L) 20(L)  Calcium 8.9 - 10.3 mg/dL 8.3(L) 8.4(L) 8.4(L)  Total Protein 6.5 - 8.1 g/dL 5.5(L) 5.4(L) 5.4(L)  Total Bilirubin 0.3 - 1.2 mg/dL 4.6(H) 3.8(H) 3.9(H)  Alkaline Phos 38 - 126 U/L 389(H) 354(H) 373(H)  AST 15 - 41 U/L 158(H) 267(H) 555(H)  ALT 0 - 44 U/L 228(H) 292(H) 400(H)    Lab Results  Component Value Date   WBC 21.9 (H) 07/01/2020   HGB 8.5 (L) 07/01/2020   HCT 28.1 (L) 07/01/2020   MCV 83.9 07/01/2020   PLT 155 07/01/2020   NEUTROABS 17.8 (H) 07/01/2020    CT ABDOMEN PELVIS WO CONTRAST  Result Date: 06/26/2020 CLINICAL DATA:  Diffuse abdominal pain and vomiting. History of cancer and renal transplant. Liver mass post recent liver biopsy. EXAM: CT ABDOMEN AND PELVIS WITHOUT CONTRAST TECHNIQUE: Multidetector CT imaging of the  abdomen and pelvis was performed following the standard protocol without IV contrast. COMPARISON:  Contrast-enhanced exam 05/27/2020. FINDINGS: Lower chest: No focal airspace disease, pleural fluid, or pulmonary nodularity. Heart is normal in size. Hepatobiliary: Patient's known hepatic lesions are not well demonstrated on noncontrast exam. Liver parenchyma is heterogeneous. There may be a small subcapsular hematoma inferiorly from recent liver biopsy, not well-defined on this noncontrast exam. The gallbladder is present. There is high-density material in the gallbladder lumen, possibly sludge. No calcified gallstone. Common bile duct is poorly defined, but no evidence of biliary dilatation. Pancreas: No ductal dilatation or inflammation. Spleen: Normal in size without focal abnormality. Adrenals/Urinary Tract: Normal adrenal glands. Chronic bilateral native renal atrophy. Solid-appearing lesion in the lower left renal hilum was better appreciated on prior contrast-enhanced CT, grossly unchanged, series 2, image 35. Transplant kidney in the left lower quadrant. Scarring in the left lower pole is better appreciated on prior contrast-enhanced exam. Similar fullness of the renal collecting system without frank hydronephrosis. No transplant perinephric edema. Sequela of failed transplant in the right iliac fossa. The urinary bladder is unremarkable. Stomach/Bowel: Bowel evaluation is limited in the absence of enteric contrast. There is no bowel obstruction or evidence of  bowel inflammation. The appendix is normal. Vascular/Lymphatic: Bulky retroperitoneal adenopathy, some of which is low-density and typical of necrosis. Occasional areas of nodal calcification. Adenopathy extends from the retrocrural space to the bilateral common iliac arteries, and left external iliac station. There also enlarged bilateral inguinal lymph nodes, left greater than right. Index aortocaval node measures 4.1 x 2.4 cm, previously 4.2 x 2.6  cm. Overall adenopathy is not significantly changed in the interim allowing for noncontrast technique. There is aortic and branch atherosclerosis. Reproductive: Bulky uterus with fibroids.  No obvious adnexal mass. Other: There is trace free fluid in the pelvis, slightly increased from prior exam. No other free fluid or ascites. No free air. Musculoskeletal: Ill-defined sclerosis involving right L2 vertebral body, nonspecific but unchanged from previous. Sclerotic density in the left acetabulum. There is hemi transitional lumbosacral anatomy. No fracture or acute osseous abnormality. IMPRESSION: 1. Patient's known hepatic lesions are not well-defined on this noncontrast exam. There may be a small subcapsular hematoma inferiorly from recent liver biopsy, not well-defined. No evidence of hemoperitoneum. 2. Bulky retroperitoneal and bilateral inguinal adenopathy, grossly unchanged from prior. 3. Transplant kidney in the left lower quadrant with unchanged fullness of the renal collecting system. Sequela of failed transplant in the right iliac fossa. Stable soft tissue density in the region of the native left kidney lower hilum. 4. Bulky uterus with fibroids. 5. Nonspecific sclerosis involving the right aspect of L2 vertebral body. Aortic Atherosclerosis (ICD10-I70.0). Electronically Signed   By: Keith Rake M.D.   On: 06/26/2020 16:45   MR Brain W Wo Contrast  Result Date: 06/28/2020 CLINICAL DATA:  Urologic cancer, staging EXAM: MRI HEAD WITHOUT AND WITH CONTRAST TECHNIQUE: Multiplanar, multiecho pulse sequences of the brain and surrounding structures were obtained without and with intravenous contrast. CONTRAST:  43mL GADAVIST GADOBUTROL 1 MMOL/ML IV SOLN COMPARISON:  MRI head 11/17/2016 FINDINGS: Brain: No acute infarction, hemorrhage, hydrocephalus, extra-axial collection or mass lesion. Normal white matter. Negative for metastatic disease to the brain. No enhancing mass lesion in the brain. Vascular: Normal  arterial flow voids. Skull and upper cervical spine: Enhancing lesion in the lower body of the C2 vertebral body. This shows decreased signal prior to contrast and is consistent with metastatic disease. No fracture or cord compression. No skull lesion identified. Benign fatty lesion in the left parietal bone. Sinuses/Orbits: Paranasal sinuses clear.  Negative orbit Other: None IMPRESSION: Negative for metastatic disease to the brain 1 cm enhancing lesion C2 vertebral body compatible with metastatic disease. No fracture or cord compression Electronically Signed   By: Franchot Gallo M.D.   On: 06/28/2020 21:31   CT ABDOMEN PELVIS W CONTRAST  Result Date: 07/01/2020 CLINICAL DATA:  Sepsis, abdominal pain, previous kidney transplant x2. Metastatic carcinoma of unknown primary. EXAM: CT ABDOMEN AND PELVIS WITH CONTRAST TECHNIQUE: Multidetector CT imaging of the abdomen and pelvis was performed using the standard protocol following bolus administration of intravenous contrast. CONTRAST:  7mL OMNIPAQUE IOHEXOL 300 MG/ML  SOLN COMPARISON:  06/26/2020 and previous FINDINGS: Lower chest: Trace pleural effusions. Hepatobiliary: Multiple liver lesions. 3.2 cm segment 2 lesion shows definite progression since 05/27/2020, although is more wedge-shaped and may represent focal infarct. Multiple low-attenuation lesions in the right lobe have not convincingly changed allowing for differences in scan timing. There are nonspecific transient hepatic attenuation differences on the portal venous phase which resolve on delayed venous phase imaging. No biliary ductal dilatation. Gallbladder unremarkable. Pancreas: Unremarkable. No pancreatic ductal dilatation or surrounding inflammatory changes. Spleen: Normal in size.  There peripheral areas of decreased enhancement which were not present on prior study of 05/27/2020, however this area was not included on the delayed scan, and may represent transient attenuation differences versus true  lesions. Adrenals/Urinary Tract: Adrenal glands unremarkable. Marked atrophy of native kidneys. Normal enhancement of left iliac fossa transplant kidney. Coarse calcifications and low-attenuation in the right iliac fossa presumably related to remote transplant. The urinary bladder is incompletely distended. Stomach/Bowel: Stomach is decompressed. Small bowel is nondilated. The colon is incompletely distended, unremarkable. Vascular/Lymphatic: Bulky bilateral inguinal, left external iliac, bilateral common iliac, left para-aortic and aortocaval adenopathy as before. The larger nodes contain scattered calcifications and some with central low-attenuation regions as before. Moderate atheromatous aortoiliac plaque without aneurysm. Reproductive: Uterine fibroid.  No adnexal mass. Other: Small volume perihepatic and pelvic ascites slightly increased. No free air. Musculoskeletal: Poorly marginated sclerotic lesions in T12, L1, and L2 vertebral bodies, progressive since 05/16/2020. benign left supra-acetabular bone island stable since 02/03/2015. IMPRESSION: 1. No definite source of sepsis identified. 2. Extensive pelvic and retroperitoneal nodal; hepatic; and osseous metastatic disease. 3. Slight increase in small volume perihepatic and pelvic ascites. Aortic Atherosclerosis (ICD10-I70.0). Electronically Signed   By: Lucrezia Europe M.D.   On: 07/01/2020 14:23   DG Chest Port 1 View  Result Date: 06/26/2020 CLINICAL DATA:  Weakness EXAM: PORTABLE CHEST 1 VIEW COMPARISON:  05/26/2020 FINDINGS: The heart size and mediastinal contours are within normal limits. Both lungs are clear. No pleural effusion or pneumothorax. The visualized skeletal structures are unremarkable. IMPRESSION: No acute process in the chest. Electronically Signed   By: Macy Mis M.D.   On: 06/26/2020 13:45   US Abdomen Limited RUQ (LIVER/GB)  Result Date: 06/27/2020 CLINICAL DATA:  Abdominal pain.  Known liver lesions. EXAM: ULTRASOUND ABDOMEN  LIMITED RIGHT UPPER QUADRANT COMPARISON:  Noncontrast abdominal CT yesterday. Contrast-enhanced abdominal CT 05/27/2020 FINDINGS: Gallbladder: Physiologically distended containing intraluminal sludge. No shadowing stones. No wall thickening. No sonographic Murphy sign noted by sonographer. Common bile duct: Diameter: 4-5 mm. Liver: Heterogeneous liver. Many of the liver lesions on prior contrast-enhanced CT are not well seen by ultrasound. There is a 1.8 x 1.5 x 1.5 cm complex hypoechoic lesion in the left lobe. No convincing subcapsular collection is suggested on CT. Portal vein is patent on color Doppler imaging with normal direction of blood flow towards the liver. Other: No right upper quadrant ascites. IMPRESSION: 1. Gallbladder sludge. No gallstones or sonographic findings of acute cholecystitis. 2. No biliary dilatation. 3. Heterogeneous liver with 1.8 cm hypoechoic lesion in the left lobe of the liver. Many of the additional liver lesions on prior contrast-enhanced CT are not well seen by ultrasound. 4. No sonographic evidence of subcapsular collection is suggested on CT. Electronically Signed   By: Keith Rake M.D.   On: 06/27/2020 17:44    ASSESSMENT: 44 yo with   1) Metastatic carcinoma of unknown primary Patient with h/o renal transplantation on tacrolimus, prednisone and cellcept chronic immunosuppression with extensive metastatic disease with extensive abd/pelvic LNadenopathy and hepatic metastases.  IHC from LN and liver metastases -- non specific morphology and IC ? Gyn vs renal tubular origin. Tumor markers unrevealing Component     Latest Ref Rng & Units 05/29/2020 05/31/2020  CEA     0.0 - 4.7 ng/mL 0.9   AFP, Serum, Tumor Marker     0.0 - 8.3 ng/mL 1.7   CA 19-9     0 - 35 U/mL 24   Cancer Antigen (CA) 125  0.0 - 38.1 U/mL 20.1   hCG Quant     mIU/mL 3   LDH     98 - 192 U/L  178    2) severe systemic inflammatory response syndrome  3) transaminitis and  hyperbilirubinemia  4) AKI with hyperkalemia, improved    PLAN: -Now off antibiotics and remains afebrile.  Cultures unrevealing so far.  Repeat CT abdomen/pelvis on 11/8 with no definite source of sepsis identified. -Cancer-ID was sent and results indicate cancer is likely renal in origin.  She was started on Cabometyx at a reduced dose of 20 mg daily.  Recommend checking daily hepatic function. -LFTs trending downward. ?due to hypotension/sepsis/transplant meds.  Continue to monitor closely. -Recommend nephrology consultation for management of her immunosuppressive drugs.  May need dose adjustment due to her worsening hepatic function. -MRI of the brain has been obtained which was negative for metastatic disease to the brain.  It did show a 1 cm enhancing lesion at the C2 vertebral body compatible with metastatic disease.   LOS: 6 days   Mikey Bussing, DNP, AGPCNP-BC, AOCNP 07/02/20  ADDENDUM  .Patient was Personally and independently interviewed, examined and relevant elements of the history of present illness were reviewed in details and an assessment and plan was created. All elements of the patient's history of present illness , assessment and plan were discussed in details with Mikey Bussing, DNP, AGPCNP-BC, AOCNP. The above documentation reflects our combined findings assessment and plan.  Sullivan Lone MD MS

## 2020-07-03 DIAGNOSIS — R112 Nausea with vomiting, unspecified: Secondary | ICD-10-CM

## 2020-07-03 DIAGNOSIS — D649 Anemia, unspecified: Secondary | ICD-10-CM | POA: Diagnosis not present

## 2020-07-03 DIAGNOSIS — C787 Secondary malignant neoplasm of liver and intrahepatic bile duct: Secondary | ICD-10-CM | POA: Diagnosis not present

## 2020-07-03 DIAGNOSIS — R109 Unspecified abdominal pain: Secondary | ICD-10-CM | POA: Diagnosis not present

## 2020-07-03 DIAGNOSIS — K759 Inflammatory liver disease, unspecified: Secondary | ICD-10-CM | POA: Diagnosis not present

## 2020-07-03 LAB — COMPREHENSIVE METABOLIC PANEL
ALT: 124 U/L — ABNORMAL HIGH (ref 0–44)
AST: 65 U/L — ABNORMAL HIGH (ref 15–41)
Albumin: 2 g/dL — ABNORMAL LOW (ref 3.5–5.0)
Alkaline Phosphatase: 298 U/L — ABNORMAL HIGH (ref 38–126)
Anion gap: 7 (ref 5–15)
BUN: 14 mg/dL (ref 6–20)
CO2: 21 mmol/L — ABNORMAL LOW (ref 22–32)
Calcium: 8 mg/dL — ABNORMAL LOW (ref 8.9–10.3)
Chloride: 107 mmol/L (ref 98–111)
Creatinine, Ser: 0.98 mg/dL (ref 0.44–1.00)
GFR, Estimated: >60 mL/min (ref >60)
Glucose, Bld: 127 mg/dL — ABNORMAL HIGH (ref 70–99)
Potassium: 3.7 mmol/L (ref 3.5–5.1)
Sodium: 135 mmol/L (ref 135–145)
Total Bilirubin: 4 mg/dL — ABNORMAL HIGH (ref 0.3–1.2)
Total Protein: 5.6 g/dL — ABNORMAL LOW (ref 6.5–8.1)

## 2020-07-03 LAB — CBC WITH DIFFERENTIAL/PLATELET
Abs Immature Granulocytes: 0.5 10*3/uL — ABNORMAL HIGH (ref 0.00–0.07)
Basophils Absolute: 0.1 10*3/uL (ref 0.0–0.1)
Basophils Relative: 0 %
Eosinophils Absolute: 0 10*3/uL (ref 0.0–0.5)
Eosinophils Relative: 0 %
HCT: 25.9 % — ABNORMAL LOW (ref 36.0–46.0)
Hemoglobin: 8.3 g/dL — ABNORMAL LOW (ref 12.0–15.0)
Immature Granulocytes: 3 %
Lymphocytes Relative: 8 %
Lymphs Abs: 1.3 10*3/uL (ref 0.7–4.0)
MCH: 26.3 pg (ref 26.0–34.0)
MCHC: 32 g/dL (ref 30.0–36.0)
MCV: 82.2 fL (ref 80.0–100.0)
Monocytes Absolute: 1.3 10*3/uL — ABNORMAL HIGH (ref 0.1–1.0)
Monocytes Relative: 8 %
Neutro Abs: 12.7 10*3/uL — ABNORMAL HIGH (ref 1.7–7.7)
Neutrophils Relative %: 81 %
Platelets: 189 10*3/uL (ref 150–400)
RBC: 3.15 MIL/uL — ABNORMAL LOW (ref 3.87–5.11)
RDW: 25.4 % — ABNORMAL HIGH (ref 11.5–15.5)
WBC: 15.9 10*3/uL — ABNORMAL HIGH (ref 4.0–10.5)
nRBC: 0.2 % (ref 0.0–0.2)

## 2020-07-03 MED ORDER — PANTOPRAZOLE SODIUM 40 MG PO TBEC
40.0000 mg | DELAYED_RELEASE_TABLET | Freq: Two times a day (BID) | ORAL | Status: DC
  Administered 2020-07-03 – 2020-07-12 (×17): 40 mg via ORAL
  Filled 2020-07-03 (×19): qty 1

## 2020-07-03 NOTE — Progress Notes (Signed)
PROGRESS NOTE    Candace Griffin  ESP:[id number] DOB: 11/14/1975 DOA: 06/26/2020 PCP: Candace Osmond, DO    Brief Narrative:  [name] was admittedto the hospital with a working diagnosis of systemicinflammatoryresponse syndrome, possible pancreatitis/ acute hepatitis,(ruledout sepsis).  44 year old female with a past medical history for renal failure status post kidney transplant, newly diagnosed metastatic carcinoma, likely renal in origin. Presented with 4 days of intractable nausea, vomiting,abdominal pain, not able to tolerate liquids or oral medications.   Patient was initially placed on broad spectrum antibiotic therapy and received volume resuscitation.   Systemic inflammatory response syndrome triggered by pancreatitis, and possible medication induced hepatitis.   Sepsis has been ruled out.   Assessment & Plan:  Severe systemic inflammatory response syndrome, suspect pancreatitis,hepatitis(sepsisruled out). Reason for her presentation is still not entirely clear.  Her lipase level was normal.  LFTs have been improving.  Abdominal pain seems to be better though she still requiring a lot of analgesic agents.  Continue with PPI and sucralfate.    Acute kidney injury likely prerenal/anion gap metabolic acidosis/lactic acidosis.Hyperkalemia/hypokelemia/ hypomagnesemia. Renal function has improved with IV hydration.  Creatinine is down to normal.  Avoid nephrotoxic agents.    Acute Hepatitis/Elevated liver enzymes/metastatic carcinoma likely renal primary/drug induced hepatitis. US liver with no gallbladder stone or sonographic signs of acute cholecystitis. 1.8x1.5x1.5 cm hypoechoic lesion left lobe of the liver. Acute viral hepatitis negative. Acetaminophen level 10. Holdingoncabozantinib, medication that can produce liver toxicity including elevated liver enzymes and bilirubins. LFTs continue to improve.  Bilirubin stable at 4.0.  AST and  ALT have significantly improved.  Alkaline phosphatase also shows improvement.  Status post kidney transplant Continue with prednisone,mycophenolate and tacrolimus. Previous rounding MD spoke with Candace Griffin from renal transplant at [facility], plan to continue current immunosuppressive regimen for now.  Tacrolimus trough level was to be checked this morning.  Results are pending. Plan to call the transfer center [phone] with the results.  May need to alter her treatment strategy since it looks like her malignancy may be renal in origin.  Anxiety Continue with as neededlorazepam.  Chronic anemia normocytic likely malignancy related/leukocytosis Hemoglobin is low but stable.  WBC has been elevated and noted to be better.  CT scan done on 11/8 did not show any evidence for infection in the abdominal cavity.   Status is: Inpatient  Remains inpatient appropriate because:IV treatments appropriate due to intensity of illness or inability to take PO   Dispo: The patient is from: Home              Anticipated d/c is to: Home              Anticipated d/c date is: 3 days              Patient currently is not medically stable to d/c.    DVT prophylaxis: Enoxaparin   Code Status:   full  Family Communication:  No family at the bedside, she has been communicating with her family over the phone.     Consultants:   Oncology     Subjective: Patient continues to have abdominal discomfort.  However she denies any nausea vomiting.  Oral intake remains poor.    Objective: Vitals:   07/02/20 2351 07/03/20 0451 07/03/20 0721 07/03/20 1100  BP: (!) 154/102 (!) 124/99 126/87 (!) 132/94  Pulse: (!) 127 (!) 128 (!) 124 (!) 123  Resp: 20 18 18 16   Temp: 97.7 F (36.5 C) 98.4 F (  36.9 C) 97.7 F (36.5 C) 98.3 F (36.8 C)  TempSrc: Oral Oral Oral Oral  SpO2: 100% 100% 100% 100%  Weight:      Height:        Intake/Output Summary (Last 24 hours) at 07/03/2020 1243 Last data  filed at 07/03/2020 0600 Gross per 24 hour  Intake 1085.14 ml  Output --  Net 1085.14 ml   Filed Weights   06/26/20 1815  Weight: 57.3 kg    Examination:   General appearance: Awake alert.  In no distress.  Noted to be lethargic.  Ill-appearing. Resp: Clear to auscultation bilaterally.  Normal effort Cardio: S1-S2 is tachycardic regular.  No S3-S4.  No rubs or bruit. GI: Abdomen is soft.  Nontender nondistended.  Bowel sounds are present normal.  No masses organomegaly Extremities: No edema.  Full range of motion of lower extremities. Neurologic: No focal neurological deficits.      Data Reviewed: I have personally reviewed following labs and imaging studies  CBC: Recent Labs  Lab 06/28/20 0528 06/29/20 0725 06/30/20 0652 07/01/20 0554 07/03/20 0401  WBC 35.0* 24.3* 24.4* 21.9* 15.9*  NEUTROABS 30.9* 20.2* 20.0* 17.8* 12.7*  HGB 9.6* 8.6* 8.8* 8.5* 8.3*  HCT 31.3* 27.9* 28.5* 28.1* 25.9*  MCV 83.7 83.3 84.1 83.9 82.2  PLT 187 140* 156 155 387   Basic Metabolic Panel: Recent Labs  Lab 06/28/20 0528 06/29/20 0725 06/30/20 0652 07/01/20 0554 07/03/20 0401  NA 138 136 139 136 135  K 3.6 3.2* 3.6 4.9 3.7  CL 106 104 109 106 107  CO2 20* 20* 21* 19* 21*  GLUCOSE 133* 146* 144* 102* 127*  BUN 27* 25* 23* 19 14  CREATININE 1.24* 1.29* 1.27* 1.04* 0.98  CALCIUM 8.5* 8.4* 8.4* 8.3* 8.0*  MG  --   --  1.8  --   --    GFR: Estimated Creatinine Clearance: 67 mL/min (by C-G formula based on SCr of 0.98 mg/dL). Liver Function Tests: Recent Labs  Lab 06/28/20 0528 06/29/20 0725 06/30/20 0652 07/01/20 0554 07/03/20 0401  AST 1,129* 555* 267* 158* 65*  ALT 594* 400* 292* 228* 124*  ALKPHOS 514* 373* 354* 389* 298*  BILITOT 4.6* 3.9* 3.8* 4.6* 4.0*  PROT 6.3* 5.4* 5.4* 5.5* 5.6*  ALBUMIN 2.7* 2.3* 2.2* 2.2* 2.0*   Recent Labs  Lab 06/27/20 0253  LIPASE 43   Coagulation Profile: Recent Labs  Lab 06/27/20 0926  INR 1.5*       Scheduled Meds: .  cabozantinib  20 mg Oral Daily  . enoxaparin (LOVENOX) injection  40 mg Subcutaneous QHS  . mouth rinse  15 mL Mouth Rinse BID  . mycophenolate  500 mg Oral BID  . oxyCODONE  15 mg Oral BID  . pantoprazole  40 mg Oral BID  . predniSONE  5 mg Oral Q breakfast  . promethazine  12.5 mg Intravenous TID AC  . sodium chloride flush  10-40 mL Intracatheter Q12H  . sucralfate  1 g Oral TID WC & HS  . Tacrolimus ER  4 mg Oral Daily   Continuous Infusions: . dextrose 5% lactated ringers 50 mL/hr at 07/03/20 5643     LOS: 7 days    Bonnielee Haff, MD  [facility] Pager: On Amion

## 2020-07-03 NOTE — Progress Notes (Signed)

## 2020-07-03 NOTE — Plan of Care (Signed)
  Problem: Activity: Goal: Risk for activity intolerance will decrease Outcome: Not Progressing   Problem: Nutrition: Goal: Adequate nutrition will be maintained Outcome: Not Progressing   

## 2020-07-03 NOTE — Progress Notes (Signed)
PT Cancellation Note  Patient Details Name: Candace Griffin MRN: [id number] DOB: July 17, 1976   Cancelled Treatment:    Reason Eval/Treat Not Completed: Medical issues which prohibited therapy (pt reports significant nausea at present, she vomited just prior to my arrival. Will follow.)  Philomena Doheny PT 07/03/2020  Acute Rehabilitation Services Pager [phone] Office [phone]

## 2020-07-03 NOTE — Progress Notes (Signed)
OT Cancellation Note  Patient Details Name: Candace Griffin MRN: [id number] DOB: Feb 16, 1976   Cancelled Treatment:    Reason Eval/Treat Not Completed: Patient declined, no reason specified. Will f/u as able.  Derrian Poli L Brizeida Mcmurry 07/03/2020, 12:04 PM

## 2020-07-04 ENCOUNTER — Inpatient Hospital Stay: Payer: Medicare Other | Admitting: Hematology

## 2020-07-04 ENCOUNTER — Ambulatory Visit: Payer: Medicare Other

## 2020-07-04 ENCOUNTER — Inpatient Hospital Stay: Payer: Medicare Other

## 2020-07-04 ENCOUNTER — Inpatient Hospital Stay: Payer: Medicare Other | Admitting: Nutrition

## 2020-07-04 DIAGNOSIS — K759 Inflammatory liver disease, unspecified: Secondary | ICD-10-CM | POA: Diagnosis not present

## 2020-07-04 DIAGNOSIS — C787 Secondary malignant neoplasm of liver and intrahepatic bile duct: Secondary | ICD-10-CM | POA: Diagnosis not present

## 2020-07-04 DIAGNOSIS — R1110 Vomiting, unspecified: Secondary | ICD-10-CM

## 2020-07-04 DIAGNOSIS — R109 Unspecified abdominal pain: Secondary | ICD-10-CM | POA: Diagnosis not present

## 2020-07-04 DIAGNOSIS — D649 Anemia, unspecified: Secondary | ICD-10-CM | POA: Diagnosis not present

## 2020-07-04 DIAGNOSIS — R112 Nausea with vomiting, unspecified: Secondary | ICD-10-CM

## 2020-07-04 LAB — CBC
HCT: 26 % — ABNORMAL LOW (ref 36.0–46.0)
Hemoglobin: 8.2 g/dL — ABNORMAL LOW (ref 12.0–15.0)
MCH: 26.2 pg (ref 26.0–34.0)
MCHC: 31.5 g/dL (ref 30.0–36.0)
MCV: 83.1 fL (ref 80.0–100.0)
Platelets: 202 10*3/uL (ref 150–400)
RBC: 3.13 MIL/uL — ABNORMAL LOW (ref 3.87–5.11)
RDW: 25.7 % — ABNORMAL HIGH (ref 11.5–15.5)
WBC: 14.8 10*3/uL — ABNORMAL HIGH (ref 4.0–10.5)
nRBC: 0.3 % — ABNORMAL HIGH (ref 0.0–0.2)

## 2020-07-04 LAB — COMPREHENSIVE METABOLIC PANEL
ALT: 97 U/L — ABNORMAL HIGH (ref 0–44)
AST: 57 U/L — ABNORMAL HIGH (ref 15–41)
Albumin: 2 g/dL — ABNORMAL LOW (ref 3.5–5.0)
Alkaline Phosphatase: 287 U/L — ABNORMAL HIGH (ref 38–126)
Anion gap: 9 (ref 5–15)
BUN: 13 mg/dL (ref 6–20)
CO2: 21 mmol/L — ABNORMAL LOW (ref 22–32)
Calcium: 8.3 mg/dL — ABNORMAL LOW (ref 8.9–10.3)
Chloride: 107 mmol/L (ref 98–111)
Creatinine, Ser: 1.05 mg/dL — ABNORMAL HIGH (ref 0.44–1.00)
GFR, Estimated: >60 mL/min (ref >60)
Glucose, Bld: 102 mg/dL — ABNORMAL HIGH (ref 70–99)
Potassium: 4.6 mmol/L (ref 3.5–5.1)
Sodium: 137 mmol/L (ref 135–145)
Total Bilirubin: 4.1 mg/dL — ABNORMAL HIGH (ref 0.3–1.2)
Total Protein: 5.7 g/dL — ABNORMAL LOW (ref 6.5–8.1)

## 2020-07-04 MED ORDER — SCOPOLAMINE 1 MG/3DAYS TD PT72
1.0000 | MEDICATED_PATCH | TRANSDERMAL | Status: DC
  Administered 2020-07-04 – 2020-07-13 (×4): 1.5 mg via TRANSDERMAL
  Filled 2020-07-04 (×5): qty 1

## 2020-07-04 NOTE — Progress Notes (Signed)
OT Cancellation Note  Patient Details Name: Candace Griffin MRN: [id number] DOB: [birth year]   Cancelled Treatment:    Reason Eval/Treat Not Completed: Other (comment). When therapist entered the room patient's resting HR 132, patient asleep and hugging a basin and emesis bag. Did not disturb patient. Will f/u as able when patient more appropriate for therapy.  Allyne Hebert L Emunah Texidor 07/04/2020, 3:16 PM

## 2020-07-04 NOTE — Progress Notes (Addendum)
HEMATOLOGY-ONCOLOGY PROGRESS NOTE  SUBJECTIVE:   Patient notes her abdominal discomfort and nausea are marginally better.  She has been able to keep her Cabometyx down for the last 3 days and tolerating to 20 mg daily.  P.o. food intake still marginal at this time. Liver enzymes are improving. Discussed her case with her renal transplant physician Scott sign off at [phone].   Discussed her case with the hospitalist Dr. Cathlean Sauer.   Oncology History  Metastatic carcinoma to liver ([place])  05/30/2020 Initial Diagnosis   Metastatic carcinoma to liver ([place])   06/13/2020 -  Chemotherapy   The patient had dexamethasone (DECADRON) 4 MG tablet, 8 mg, Oral, Daily, 1 of 1 cycle, Start date: 06/10/2020, End date: -- palonosetron (ALOXI) injection 0.25 mg, 0.25 mg, Intravenous,  Once, 1 of 4 cycles Administration: 0.25 mg (06/13/2020) pegfilgrastim-jmdb (FULPHILA) injection 6 mg, 6 mg, Subcutaneous,  Once, 1 of 4 cycles Administration: 6 mg (06/15/2020) CARBOplatin (PARAPLATIN) 390 mg in sodium chloride 0.9 % 250 mL chemo infusion, 390 mg (78.9 % of original dose 491.5 mg), Intravenous,  Once, 1 of 4 cycles Dose modification:   (original dose 491.5 mg, Cycle 1) Administration: 390 mg (06/13/2020) fosaprepitant (EMEND) 150 mg in sodium chloride 0.9 % 145 mL IVPB, 150 mg, Intravenous,  Once, 1 of 4 cycles Administration: 150 mg (06/13/2020) PACLitaxel (TAXOL) 54 mg in sodium chloride 0.9 % 150 mL chemo infusion (> 80mg /m2), 30 mg/m2 = 54 mg (60 % of original dose 50 mg/m2), Intravenous,  Once, 1 of 4 cycles Dose modification: 50 mg/m2 (original dose 50 mg/m2, Cycle 1, Reason: Change in LFTs), 30 mg/m2 (original dose 50 mg/m2, Cycle 1, Reason: Change in LFTs) Administration: 54 mg (06/13/2020)  for chemotherapy treatment.    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 with unknown primary site [facility])  06/10/2020 Initial Diagnosis   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 with  unknown primary site [facility])   06/13/2020 -  Chemotherapy   The patient had dexamethasone (DECADRON) 4 MG tablet, 8 mg, Oral, Daily, 1 of 1 cycle, Start date: 06/10/2020, End date: -- palonosetron (ALOXI) injection 0.25 mg, 0.25 mg, Intravenous,  Once, 1 of 4 cycles Administration: 0.25 mg (06/13/2020) pegfilgrastim-jmdb (FULPHILA) injection 6 mg, 6 mg, Subcutaneous,  Once, 1 of 4 cycles Administration: 6 mg (06/15/2020) CARBOplatin (PARAPLATIN) 390 mg in sodium chloride 0.9 % 250 mL chemo infusion, 390 mg (78.9 % of original dose 491.5 mg), Intravenous,  Once, 1 of 4 cycles Dose modification:   (original dose 491.5 mg, Cycle 1) Administration: 390 mg (06/13/2020) fosaprepitant (EMEND) 150 mg in sodium chloride 0.9 % 145 mL IVPB, 150 mg, Intravenous,  Once, 1 of 4 cycles Administration: 150 mg (06/13/2020) PACLitaxel (TAXOL) 54 mg in sodium chloride 0.9 % 150 mL chemo infusion (> 80mg /m2), 30 mg/m2 = 54 mg (60 % of original dose 50 mg/m2), Intravenous,  Once, 1 of 4 cycles Dose modification: 50 mg/m2 (original dose 50 mg/m2, Cycle 1, Reason: Change in LFTs), 30 mg/m2 (original dose 50 mg/m2, Cycle 1, Reason: Change in LFTs) Administration: 54 mg (06/13/2020)  for chemotherapy treatment.       REVIEW OF SYSTEMS:   .10 Point review of Systems was done is negative except as noted above.    PHYSICAL EXAMINATION: ECOG PERFORMANCE STATUS: 2 - Symptomatic, <50% confined to bed  Vitals:   07/04/20 0625 07/04/20 0957  BP: (!) 134/93 (!) 122/92  Pulse: (!) 127 (!) 127  Resp: 16 14  Temp:  98.7 F (37.1 C) 98.3 F (36.8 C)  SpO2: 99% 100%   Filed Weights   06/26/20 1815  Weight: 126 lb 5.2 oz (57.3 kg)    Intake/Output from previous day: 11/10 0701 - 11/11 0700 In: 688.6 [P.O.:120; I.V.:568.6] Out: -   . GENERAL:alert, in no acute distress and comfortable SKIN: no acute rashes, no significant lesions EYES: conjunctiva are pink and non-injected, sclera anicteric OROPHARYNX: MMM,  no exudates, no oropharyngeal erythema or ulceration NECK: supple, no JVD LYMPH:  no palpable lymphadenopathy in the cervical, axillary or inguinal regions LUNGS: clear to auscultation b/l with normal respiratory effort HEART: regular rate & rhythm ABDOMEN:  normoactive bowel sounds , tenderness to light palpation over the upper abdomen.  No guarding rigidity or rebound Extremity: no pedal edema PSYCH: alert & oriented x 3 with fluent speech NEURO: no focal motor/sensory deficits   LABORATORY DATA:  I have reviewed the data as listed CMP Latest Ref Rng & Units 07/03/2020 07/01/2020  Glucose 70 - 99 mg/dL 127(H) 102(H)  BUN 6 - 20 mg/dL 14 19  Creatinine 0.44 - 1.00 mg/dL 0.98 1.04(H)  Sodium 135 - 145 mmol/L 135 136  Potassium 3.5 - 5.1 mmol/L 3.7 4.9  Chloride 98 - 111 mmol/L 107 106  CO2 22 - 32 mmol/L 21(L) 19(L)  Calcium 8.9 - 10.3 mg/dL 8.0(L) 8.3(L)  Total Protein 6.5 - 8.1 g/dL 5.6(L) 5.5(L)  Total Bilirubin 0.3 - 1.2 mg/dL 4.0(H) 4.6(H)  Alkaline Phos 38 - 126 U/L 298(H) 389(H)  AST 15 - 41 U/L 65(H) 158(H)  ALT 0 - 44 U/L 124(H) 228(H)   . CBC Latest Ref Rng & Units 07/03/2020 07/01/2020  WBC 4.0 - 10.5 K/uL 15.9(H) 21.9(H)  Hemoglobin 12.0 - 15.0 g/dL 8.3(L) 8.5(L)  Hematocrit 36 - 46 % 25.9(L) 28.1(L)  Platelets 150 - 400 K/uL 189 155   CT ABDOMEN PELVIS WO CONTRAST  Result Date: 06/26/2020 CLINICAL DATA:  Diffuse abdominal pain and vomiting. History of cancer and renal transplant. Liver mass post recent liver biopsy. EXAM: CT ABDOMEN AND PELVIS WITHOUT CONTRAST TECHNIQUE: Multidetector CT imaging of the abdomen and pelvis was performed following the standard protocol without IV contrast. COMPARISON:  Contrast-enhanced exam 05/27/2020. FINDINGS: Lower chest: No focal airspace disease, pleural fluid, or pulmonary nodularity. Heart is normal in size. Hepatobiliary: Patient's known hepatic lesions are not well demonstrated on noncontrast exam. Liver parenchyma is  heterogeneous. There may be a small subcapsular hematoma inferiorly from recent liver biopsy, not well-defined on this noncontrast exam. The gallbladder is present. There is high-density material in the gallbladder lumen, possibly sludge. No calcified gallstone. Common bile duct is poorly defined, but no evidence of biliary dilatation. Pancreas: No ductal dilatation or inflammation. Spleen: Normal in size without focal abnormality. Adrenals/Urinary Tract: Normal adrenal glands. Chronic bilateral native renal atrophy. Solid-appearing lesion in the lower left renal hilum was better appreciated on prior contrast-enhanced CT, grossly unchanged, series 2, image 35. Transplant kidney in the left lower quadrant. Scarring in the left lower pole is better appreciated on prior contrast-enhanced exam. Similar fullness of the renal collecting system without frank hydronephrosis. No transplant perinephric edema. Sequela of failed transplant in the right iliac fossa. The urinary bladder is unremarkable. Stomach/Bowel: Bowel evaluation is limited in the absence of enteric contrast. There is no bowel obstruction or evidence of bowel inflammation. The appendix is normal. Vascular/Lymphatic: Bulky retroperitoneal adenopathy, some of which is low-density and typical of necrosis. Occasional areas of nodal calcification. Adenopathy extends from the  retrocrural space to the bilateral common iliac arteries, and left external iliac station. There also enlarged bilateral inguinal lymph nodes, left greater than right. Index aortocaval node measures 4.1 x 2.4 cm, previously 4.2 x 2.6 cm. Overall adenopathy is not significantly changed in the interim allowing for noncontrast technique. There is aortic and branch atherosclerosis. Reproductive: Bulky uterus with fibroids.  No obvious adnexal mass. Other: There is trace free fluid in the pelvis, slightly increased from prior exam. No other free fluid or ascites. No free air. Musculoskeletal:  Ill-defined sclerosis involving right L2 vertebral body, nonspecific but unchanged from previous. Sclerotic density in the left acetabulum. There is hemi transitional lumbosacral anatomy. No fracture or acute osseous abnormality. IMPRESSION: 1. Patient's known hepatic lesions are not well-defined on this noncontrast exam. There may be a small subcapsular hematoma inferiorly from recent liver biopsy, not well-defined. No evidence of hemoperitoneum. 2. Bulky retroperitoneal and bilateral inguinal adenopathy, grossly unchanged from prior. 3. Transplant kidney in the left lower quadrant with unchanged fullness of the renal collecting system. Sequela of failed transplant in the right iliac fossa. Stable soft tissue density in the region of the native left kidney lower hilum. 4. Bulky uterus with fibroids. 5. Nonspecific sclerosis involving the right aspect of L2 vertebral body. Aortic Atherosclerosis (ICD10-I70.0). Electronically Signed   By: Keith Rake M.D.   On: 06/26/2020 16:45   MR Brain W Wo Contrast  Result Date: 06/28/2020 CLINICAL DATA:  Urologic cancer, staging EXAM: MRI HEAD WITHOUT AND WITH CONTRAST TECHNIQUE: Multiplanar, multiecho pulse sequences of the brain and surrounding structures were obtained without and with intravenous contrast. CONTRAST:  37mL GADAVIST GADOBUTROL 1 MMOL/ML IV SOLN COMPARISON:  MRI head 11/17/2016 FINDINGS: Brain: No acute infarction, hemorrhage, hydrocephalus, extra-axial collection or mass lesion. Normal white matter. Negative for metastatic disease to the brain. No enhancing mass lesion in the brain. Vascular: Normal arterial flow voids. Skull and upper cervical spine: Enhancing lesion in the lower body of the C2 vertebral body. This shows decreased signal prior to contrast and is consistent with metastatic disease. No fracture or cord compression. No skull lesion identified. Benign fatty lesion in the left parietal bone. Sinuses/Orbits: Paranasal sinuses clear.   Negative orbit Other: None IMPRESSION: Negative for metastatic disease to the brain 1 cm enhancing lesion C2 vertebral body compatible with metastatic disease. No fracture or cord compression Electronically Signed   By: Franchot Gallo M.D.   On: 06/28/2020 21:31   CT ABDOMEN PELVIS W CONTRAST  Result Date: 07/01/2020 CLINICAL DATA:  Sepsis, abdominal pain, previous kidney transplant x2. Metastatic carcinoma of unknown primary. EXAM: CT ABDOMEN AND PELVIS WITH CONTRAST TECHNIQUE: Multidetector CT imaging of the abdomen and pelvis was performed using the standard protocol following bolus administration of intravenous contrast. CONTRAST:  40mL OMNIPAQUE IOHEXOL 300 MG/ML  SOLN COMPARISON:  06/26/2020 and previous FINDINGS: Lower chest: Trace pleural effusions. Hepatobiliary: Multiple liver lesions. 3.2 cm segment 2 lesion shows definite progression since 05/27/2020, although is more wedge-shaped and may represent focal infarct. Multiple low-attenuation lesions in the right lobe have not convincingly changed allowing for differences in scan timing. There are nonspecific transient hepatic attenuation differences on the portal venous phase which resolve on delayed venous phase imaging. No biliary ductal dilatation. Gallbladder unremarkable. Pancreas: Unremarkable. No pancreatic ductal dilatation or surrounding inflammatory changes. Spleen: Normal in size. There peripheral areas of decreased enhancement which were not present on prior study of 05/27/2020, however this area was not included on the delayed scan, and may represent  transient attenuation differences versus true lesions. Adrenals/Urinary Tract: Adrenal glands unremarkable. Marked atrophy of native kidneys. Normal enhancement of left iliac fossa transplant kidney. Coarse calcifications and low-attenuation in the right iliac fossa presumably related to remote transplant. The urinary bladder is incompletely distended. Stomach/Bowel: Stomach is decompressed.  Small bowel is nondilated. The colon is incompletely distended, unremarkable. Vascular/Lymphatic: Bulky bilateral inguinal, left external iliac, bilateral common iliac, left para-aortic and aortocaval adenopathy as before. The larger nodes contain scattered calcifications and some with central low-attenuation regions as before. Moderate atheromatous aortoiliac plaque without aneurysm. Reproductive: Uterine fibroid.  No adnexal mass. Other: Small volume perihepatic and pelvic ascites slightly increased. No free air. Musculoskeletal: Poorly marginated sclerotic lesions in T12, L1, and L2 vertebral bodies, progressive since 05/16/2020. benign left supra-acetabular bone island stable since 02/03/2015. IMPRESSION: 1. No definite source of sepsis identified. 2. Extensive pelvic and retroperitoneal nodal; hepatic; and osseous metastatic disease. 3. Slight increase in small volume perihepatic and pelvic ascites. Aortic Atherosclerosis (ICD10-I70.0). Electronically Signed   By: Lucrezia Europe M.D.   On: 07/01/2020 14:23   DG Chest Port 1 View  Result Date: 06/26/2020 CLINICAL DATA:  Weakness EXAM: PORTABLE CHEST 1 VIEW COMPARISON:  05/26/2020 FINDINGS: The heart size and mediastinal contours are within normal limits. Both lungs are clear. No pleural effusion or pneumothorax. The visualized skeletal structures are unremarkable. IMPRESSION: No acute process in the chest. Electronically Signed   By: Macy Mis M.D.   On: 06/26/2020 13:45   US Abdomen Limited RUQ (LIVER/GB)  Result Date: 06/27/2020 CLINICAL DATA:  Abdominal pain.  Known liver lesions. EXAM: ULTRASOUND ABDOMEN LIMITED RIGHT UPPER QUADRANT COMPARISON:  Noncontrast abdominal CT yesterday. Contrast-enhanced abdominal CT 05/27/2020 FINDINGS: Gallbladder: Physiologically distended containing intraluminal sludge. No shadowing stones. No wall thickening. No sonographic Murphy sign noted by sonographer. Common bile duct: Diameter: 4-5 mm. Liver: Heterogeneous  liver. Many of the liver lesions on prior contrast-enhanced CT are not well seen by ultrasound. There is a 1.8 x 1.5 x 1.5 cm complex hypoechoic lesion in the left lobe. No convincing subcapsular collection is suggested on CT. Portal vein is patent on color Doppler imaging with normal direction of blood flow towards the liver. Other: No right upper quadrant ascites. IMPRESSION: 1. Gallbladder sludge. No gallstones or sonographic findings of acute cholecystitis. 2. No biliary dilatation. 3. Heterogeneous liver with 1.8 cm hypoechoic lesion in the left lobe of the liver. Many of the additional liver lesions on prior contrast-enhanced CT are not well seen by ultrasound. 4. No sonographic evidence of subcapsular collection is suggested on CT. Electronically Signed   By: Keith Rake M.D.   On: 06/27/2020 17:44    ASSESSMENT: 44 yo with   1) Metastatic renal papillary cell carcinoma. Extensive metastases to abdominal pelvic lymph nodes, bones and liver.  Patient with h/o renal transplantation on tacrolimus, prednisone and cellcept chronic immunosuppression with extensive metastatic disease with extensive abd/pelvic LNadenopathy and hepatic metastases.  IHC from LN and liver metastases -- non specific morphology and IC ? Gyn vs renal tubular origin. Tumor markers unrevealing However tissue profiling with cancer type DU-20% certainty of papillary renal cell carcinoma.;  Patient received 1 cycle of carbotaxol and has been switched to Cabometyx. Component     Latest Ref Rng & Units 05/29/2020 05/31/2020  CEA     0.0 - 4.7 ng/mL 0.9   AFP, Serum, Tumor Marker     0.0 - 8.3 ng/mL 1.7   CA 19-9     0 -  35 U/mL 24   Cancer Antigen (CA) 125     0.0 - 38.1 U/mL 20.1   hCG Quant     mIU/mL 3   LDH     98 - 192 U/L  178    2) severe systemic inflammatory response syndrome  3) transaminitis and hyperbilirubinemia  4) AKI with hyperkalemia, improved    PLAN: -Now off antibiotics and  remains afebrile.  Cultures unrevealing so far.  Repeat CT abdomen/pelvis on 11/8 with no definite source of sepsis identified. -Continue on Cabometyx at a reduced dose of 20 mg daily.  Recommend checking daily hepatic function. -LFTs trending downward.  We will plan to increase Cabometyx to 40 mg p.o. daily next week if continues to be tolerated. -MRI of the brain has been obtained which was negative for metastatic disease to the brain.  It did show a 1 cm enhancing lesion at the C2 vertebral body compatible with metastatic disease. -We will plan to start the patient on Zometa when stable and out of the hospital for metastatic bone disease. -Her case was discussed with her renal transplant physician Aquilla Hacker at [facility] at [phone].  Discussed to determine options for reducing her immune suppression.  He noted there might be advantages in trying to determine if renal cell cancer is often donor kidney origin versus native kidney to determine potential effectiveness of backing off on immune suppression. -I discussed with the patient that there is an important role of backing off on immune suppression to try to help her body fight of the cancer.  The downside risk would be that she could have renal function loss from rejection and may land up on dialysis. -Dr Altamease Oiler will be discussing with his pathologist at [facility] to know what testing to send out to determine donor versus native kidney origin of her renal cell carcinoma. -A trough Prograf level pending -if this is adequate Dr. Altamease Oiler was inclined to possibly consider backing off on the CellCept and then gradually using the least effective dose of Prograf. -Oncology will continue to follow -Would recommend nutritional consultation to optimize p.o. intake and PT OT evaluations to determine discharge needs. -Transfuse as needed for hemoglobin less than 8.   LOS: 8 days   Sullivan Lone MD [place]  Total  time spent more than 35 minutes more than 50% of the time on direct patient contact counseling and coordination of care with [facility] nephrology and hospital medicine

## 2020-07-04 NOTE — Progress Notes (Signed)
   07/04/20 0957  Assess: MEWS Score  Temp 98.3 F (36.8 C)  BP (!) 122/92  Pulse Rate (!) 127  Resp 14  Level of Consciousness Alert  SpO2 100 %  O2 Device Room Air  Assess: MEWS Score  MEWS Temp 0  MEWS Systolic 0  MEWS Pulse 2  MEWS RR 0  MEWS LOC 0  MEWS Score 2  MEWS Score Color Yellow  Assess: if the MEWS score is Yellow or Red  Were vital signs taken at a resting state? Yes  Focused Assessment No change from prior assessment  Early Detection of Sepsis Score *See Row Information* Medium  MEWS guidelines implemented *See Row Information* No, previously yellow, continue vital signs every 4 hours  Treat  Pain Score 4  Notify: Charge Nurse/RN  Name of Charge Nurse/RN Notified Chancy Hurter RN  Date Charge Nurse/RN Notified 07/04/20  Time Charge Nurse/RN Notified 1000

## 2020-07-04 NOTE — Evaluation (Signed)
Physical Therapy Evaluation Patient Details Name: Candace Griffin MRN: [id number] DOB: Mar 17, 1976 Today's Date: 07/04/2020   History of Present Illness  44 year old female with a past medical history for renal failure status post kidney transplant, newly diagnosed metastatic carcinoma to liver, unknown primary.  Presents with intractable nausea, vomiting, abdominal pain, not able to tolerate liquids or oral medications. Renal cell carcinoma. Dx of SIRS; likely pancreatitis, AKI; acute hepatitis; anxiety.  Clinical Impression  Pt admitted with above diagnosis.  Pt currently with functional limitations due to the deficits listed below (see PT Problem List). Pt will benefit from skilled PT to increase their independence and safety with mobility to allow discharge to the venue listed below.  Pt still with pain however agreeable to attempt mobility in preparation for eventual d/c home.  Pt sat EOB for at least 10 minutes and took meds from RN.  Pt then assisted to [facility] however further activity deferred due to elevated HR (up to 162 bpm).  Pt plans to d/c home and agreeable for RW upon d/c.     Follow Up Recommendations No PT follow up;Supervision for mobility/OOB    Equipment Recommendations  Rolling walker with 5" wheels    Recommendations for Other Services       Precautions / Restrictions Precautions Precautions: Fall Precaution Comments: significant pain Restrictions Weight Bearing Restrictions: No      Mobility  Bed Mobility Overal bed mobility: Modified Independent             General bed mobility comments: increased time, performs log roll technique    Transfers Overall transfer level: Needs assistance Equipment used: 1 person hand held assist Transfers: Sit to/from Stand Sit to Stand: Min guard         General transfer comment: min/guard for safety, HHA provided for support  Ambulation/Gait Ambulation/Gait assistance: Min assist;Min guard Gait Distance  (Feet): 4 Feet Assistive device: 1 person hand held assist Gait Pattern/deviations: Step-through pattern;Decreased stride length     General Gait Details: pt took a few steps to BSC, HR up to 162 bpm (RN aware) - deferred further activity  Stairs            Wheelchair Mobility    Modified Rankin (Stroke Patients Only)       Balance                                             Pertinent Vitals/Pain Pain Assessment: 0-10 Pain Score: 8  Pain Location: back; abdomen; B thighs Pain Descriptors / Indicators: Aching;Burning Pain Intervention(s): Monitored during session;Repositioned (not due for pain meds (RN aware of pain))    Home Living Family/patient expects to be discharged to:: Private residence Living Arrangements: Children (12; 16;18) Available Help at Discharge: Family;Available 24 hours/day Type of Home: House Home Access: Stairs to enter Entrance Stairs-Rails: Right Entrance Stairs-Number of Steps: 6 Home Layout: Two level;Bed/bath upstairs;1/2 bath on main level Home Equipment: Bedside commode      Prior Function Level of Independence: Independent;Needs assistance   Gait / Transfers Assistance Needed: walking without wihtout AD; prior to admission, pt was "holing on to her son" becuase she was in so much pain; was washing herself up after set up; was living downstairs because she was unable to navigate steps to bedroom     Comments: enjoys watching her nieces/nephews; reads - romance novels  Hand Dominance   Dominant Hand: Left    Extremity/Trunk Assessment        Lower Extremity Assessment Lower Extremity Assessment: Generalized weakness    Cervical / Trunk Assessment Cervical / Trunk Assessment: Normal  Communication   Communication: No difficulties  Cognition Arousal/Alertness: Awake/alert Behavior During Therapy: WFL for tasks assessed/performed Overall Cognitive Status: Within Functional Limits for tasks assessed                                         General Comments      Exercises     Assessment/Plan    PT Assessment Patient needs continued PT services  PT Problem List Decreased mobility;Decreased strength;Decreased knowledge of use of DME;Decreased activity tolerance;Pain       PT Treatment Interventions DME instruction;Therapeutic exercise;Gait training;Functional mobility training;Therapeutic activities;Patient/family education;Balance training;Stair training    PT Goals (Current goals can be found in the Care Plan section)  Acute Rehab PT Goals PT Goal Formulation: With patient Time For Goal Achievement: 07/18/20 Potential to Achieve Goals: Good    Frequency Min 3X/week   Barriers to discharge        Co-evaluation               AM-PAC PT "6 Clicks" Mobility  Outcome Measure Help needed turning from your back to your side while in a flat bed without using bedrails?: None Help needed moving from lying on your back to sitting on the side of a flat bed without using bedrails?: None Help needed moving to and from a bed to a chair (including a wheelchair)?: A Little Help needed standing up from a chair using your arms (e.g., wheelchair or bedside chair)?: A Little Help needed to walk in hospital room?: A Little Help needed climbing 3-5 steps with a railing? : A Lot 6 Click Score: 19    End of Session Equipment Utilized During Treatment: Gait belt Activity Tolerance: Treatment limited secondary to medical complications (Comment) (pain and tachycardia) Patient left: in bed;with call bell/phone within reach;with bed alarm set Nurse Communication: Mobility status PT Visit Diagnosis: Difficulty in walking, not elsewhere classified (R26.2)    Time: 8916-9450 PT Time Calculation (min) (ACUTE ONLY): 28 min   Charges:   PT Evaluation $PT Eval Low Complexity: 1 Low         Kati PT, DPT Acute Rehabilitation Services Pager: [phone] Office:  [phone]    Trena Platt 07/04/2020, 12:29 PM

## 2020-07-04 NOTE — Progress Notes (Signed)
Pt resting after working with PT. Heart Rate 130's. Pt's heart rate does run Tachy 100-120s. VS are other stable. Pt in bed and Medicated for Pain and Nausea, MD updated and maintain current plan of care for Pt.

## 2020-07-04 NOTE — Care Management Important Message (Signed)
Important Message  Patient Details IM Letter given to the Patient Name: Candace Griffin MRN: [id number] Date of Birth: 26-Mar-1976   Medicare Important Message Given:  Yes     [name] 07/04/2020, 10:23 AM

## 2020-07-04 NOTE — Progress Notes (Addendum)
HEMATOLOGY-ONCOLOGY PROGRESS NOTE  SUBJECTIVE:   The patient is resting quietly after receiving pain medication.  Nursing reports that she had an episode of vomiting earlier today.  She vomited about 15 minutes after taking her morning meds including her Cabometyx.  Nursing reports that her heart rate was up in the 140s to 150s with minimal exertion.  Oncology History  Metastatic carcinoma to liver ([facility])  05/30/2020 Initial Diagnosis   Metastatic carcinoma to liver ([facility])   06/13/2020 -  Chemotherapy   The patient had dexamethasone (DECADRON) 4 MG tablet, 8 mg, Oral, Daily, 1 of 1 cycle, Start date: 06/10/2020, End date: -- palonosetron (ALOXI) injection 0.25 mg, 0.25 mg, Intravenous,  Once, 1 of 4 cycles Administration: 0.25 mg (06/13/2020) pegfilgrastim-jmdb (FULPHILA) injection 6 mg, 6 mg, Subcutaneous,  Once, 1 of 4 cycles Administration: 6 mg (06/15/2020) CARBOplatin (PARAPLATIN) 390 mg in sodium chloride 0.9 % 250 mL chemo infusion, 390 mg (78.9 % of original dose 491.5 mg), Intravenous,  Once, 1 of 4 cycles Dose modification:   (original dose 491.5 mg, Cycle 1) Administration: 390 mg (06/13/2020) fosaprepitant (EMEND) 150 mg in sodium chloride 0.9 % 145 mL IVPB, 150 mg, Intravenous,  Once, 1 of 4 cycles Administration: 150 mg (06/13/2020) PACLitaxel (TAXOL) 54 mg in sodium chloride 0.9 % 150 mL chemo infusion (> 80mg /m2), 30 mg/m2 = 54 mg (60 % of original dose 50 mg/m2), Intravenous,  Once, 1 of 4 cycles Dose modification: 50 mg/m2 (original dose 50 mg/m2, Cycle 1, Reason: Change in LFTs), 30 mg/m2 (original dose 50 mg/m2, Cycle 1, Reason: Change in LFTs) Administration: 54 mg (06/13/2020)  for chemotherapy treatment.    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 with unknown primary site [facility])  06/10/2020 Initial Diagnosis   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 with unknown primary site [facility])   06/13/2020 -  Chemotherapy   The patient had  dexamethasone (DECADRON) 4 MG tablet, 8 mg, Oral, Daily, 1 of 1 cycle, Start date: 06/10/2020, End date: -- palonosetron (ALOXI) injection 0.25 mg, 0.25 mg, Intravenous,  Once, 1 of 4 cycles Administration: 0.25 mg (06/13/2020) pegfilgrastim-jmdb (FULPHILA) injection 6 mg, 6 mg, Subcutaneous,  Once, 1 of 4 cycles Administration: 6 mg (06/15/2020) CARBOplatin (PARAPLATIN) 390 mg in sodium chloride 0.9 % 250 mL chemo infusion, 390 mg (78.9 % of original dose 491.5 mg), Intravenous,  Once, 1 of 4 cycles Dose modification:   (original dose 491.5 mg, Cycle 1) Administration: 390 mg (06/13/2020) fosaprepitant (EMEND) 150 mg in sodium chloride 0.9 % 145 mL IVPB, 150 mg, Intravenous,  Once, 1 of 4 cycles Administration: 150 mg (06/13/2020) PACLitaxel (TAXOL) 54 mg in sodium chloride 0.9 % 150 mL chemo infusion (> 80mg /m2), 30 mg/m2 = 54 mg (60 % of original dose 50 mg/m2), Intravenous,  Once, 1 of 4 cycles Dose modification: 50 mg/m2 (original dose 50 mg/m2, Cycle 1, Reason: Change in LFTs), 30 mg/m2 (original dose 50 mg/m2, Cycle 1, Reason: Change in LFTs) Administration: 54 mg (06/13/2020)  for chemotherapy treatment.       REVIEW OF SYSTEMS:   .10 Point review of Systems was done is negative except as noted above.    PHYSICAL EXAMINATION: ECOG PERFORMANCE STATUS: 2 - Symptomatic, <50% confined to bed  Vitals:   07/04/20 0957 07/04/20 1234  BP: (!) 122/92 (!) 127/92  Pulse: (!) 127 (!) 130  Resp: 14 14  Temp: 98.3 F (36.8 C) 98.2 F (36.8 C)  SpO2: 100% 100%   [employer]  06/26/20 1815  Weight: 57.3 kg    Intake/Output from previous day: 11/10 0701 - 11/11 0700 In: 688.6 [P.O.:120; I.V.:568.6] Out: -   . GENERAL:alert, in no acute distress and comfortable SKIN: no acute rashes, no significant lesions EYES: conjunctiva are pink and non-injected, sclera anicteric OROPHARYNX: MMM, no exudates, no oropharyngeal erythema or ulceration NECK: supple, no JVD LYMPH:  no  palpable lymphadenopathy in the cervical, axillary or inguinal regions LUNGS: clear to auscultation b/l with normal respiratory effort HEART: regular rate & rhythm ABDOMEN:  normoactive bowel sounds , tenderness to light palpation over the upper abdomen.  No guarding rigidity or rebound Extremity: no pedal edema PSYCH: alert & oriented x 3 with fluent speech NEURO: no focal motor/sensory deficits   LABORATORY DATA:  I have reviewed the data as listed CMP Latest Ref Rng & Units 07/03/2020 07/01/2020  Glucose 70 - 99 mg/dL 127(H) 102(H)  BUN 6 - 20 mg/dL 14 19  Creatinine 0.44 - 1.00 mg/dL 0.98 1.04(H)  Sodium 135 - 145 mmol/L 135 136  Potassium 3.5 - 5.1 mmol/L 3.7 4.9  Chloride 98 - 111 mmol/L 107 106  CO2 22 - 32 mmol/L 21(L) 19(L)  Calcium 8.9 - 10.3 mg/dL 8.0(L) 8.3(L)  Total Protein 6.5 - 8.1 g/dL 5.6(L) 5.5(L)  Total Bilirubin 0.3 - 1.2 mg/dL 4.0(H) 4.6(H)  Alkaline Phos 38 - 126 U/L 298(H) 389(H)  AST 15 - 41 U/L 65(H) 158(H)  ALT 0 - 44 U/L 124(H) 228(H)   . CBC Latest Ref Rng & Units 07/03/2020 07/01/2020  WBC 4.0 - 10.5 K/uL 15.9(H) 21.9(H)  Hemoglobin 12.0 - 15.0 g/dL 8.3(L) 8.5(L)  Hematocrit 36 - 46 % 25.9(L) 28.1(L)  Platelets 150 - 400 K/uL 189 155   CT ABDOMEN PELVIS WO CONTRAST  Result Date: 06/26/2020 CLINICAL DATA:  Diffuse abdominal pain and vomiting. History of cancer and renal transplant. Liver mass post recent liver biopsy. EXAM: CT ABDOMEN AND PELVIS WITHOUT CONTRAST TECHNIQUE: Multidetector CT imaging of the abdomen and pelvis was performed following the standard protocol without IV contrast. COMPARISON:  Contrast-enhanced exam 05/27/2020. FINDINGS: Lower chest: No focal airspace disease, pleural fluid, or pulmonary nodularity. Heart is normal in size. Hepatobiliary: Patient's known hepatic lesions are not well demonstrated on noncontrast exam. Liver parenchyma is heterogeneous. There may be a small subcapsular hematoma inferiorly from recent liver biopsy, not  well-defined on this noncontrast exam. The gallbladder is present. There is high-density material in the gallbladder lumen, possibly sludge. No calcified gallstone. Common bile duct is poorly defined, but no evidence of biliary dilatation. Pancreas: No ductal dilatation or inflammation. Spleen: Normal in size without focal abnormality. Adrenals/Urinary Tract: Normal adrenal glands. Chronic bilateral native renal atrophy. Solid-appearing lesion in the lower left renal hilum was better appreciated on prior contrast-enhanced CT, grossly unchanged, series 2, image 35. Transplant kidney in the left lower quadrant. Scarring in the left lower pole is better appreciated on prior contrast-enhanced exam. Similar fullness of the renal collecting system without frank hydronephrosis. No transplant perinephric edema. Sequela of failed transplant in the right iliac fossa. The urinary bladder is unremarkable. Stomach/Bowel: Bowel evaluation is limited in the absence of enteric contrast. There is no bowel obstruction or evidence of bowel inflammation. The appendix is normal. Vascular/Lymphatic: Bulky retroperitoneal adenopathy, some of which is low-density and typical of necrosis. Occasional areas of nodal calcification. Adenopathy extends from the retrocrural space to the bilateral common iliac arteries, and left external iliac station. There also enlarged bilateral inguinal lymph nodes, left greater  than right. Index aortocaval node measures 4.1 x 2.4 cm, previously 4.2 x 2.6 cm. Overall adenopathy is not significantly changed in the interim allowing for noncontrast technique. There is aortic and branch atherosclerosis. Reproductive: Bulky uterus with fibroids.  No obvious adnexal mass. Other: There is trace free fluid in the pelvis, slightly increased from prior exam. No other free fluid or ascites. No free air. Musculoskeletal: Ill-defined sclerosis involving right L2 vertebral body, nonspecific but unchanged from previous.  Sclerotic density in the left acetabulum. There is hemi transitional lumbosacral anatomy. No fracture or acute osseous abnormality. IMPRESSION: 1. Patient's known hepatic lesions are not well-defined on this noncontrast exam. There may be a small subcapsular hematoma inferiorly from recent liver biopsy, not well-defined. No evidence of hemoperitoneum. 2. Bulky retroperitoneal and bilateral inguinal adenopathy, grossly unchanged from prior. 3. Transplant kidney in the left lower quadrant with unchanged fullness of the renal collecting system. Sequela of failed transplant in the right iliac fossa. Stable soft tissue density in the region of the native left kidney lower hilum. 4. Bulky uterus with fibroids. 5. Nonspecific sclerosis involving the right aspect of L2 vertebral body. Aortic Atherosclerosis (ICD10-I70.0). Electronically Signed   By: Keith Rake M.D.   On: 06/26/2020 16:45   MR Brain W Wo Contrast  Result Date: 06/28/2020 CLINICAL DATA:  Urologic cancer, staging EXAM: MRI HEAD WITHOUT AND WITH CONTRAST TECHNIQUE: Multiplanar, multiecho pulse sequences of the brain and surrounding structures were obtained without and with intravenous contrast. CONTRAST:  38mL GADAVIST GADOBUTROL 1 MMOL/ML IV SOLN COMPARISON:  MRI head 11/17/2016 FINDINGS: Brain: No acute infarction, hemorrhage, hydrocephalus, extra-axial collection or mass lesion. Normal white matter. Negative for metastatic disease to the brain. No enhancing mass lesion in the brain. Vascular: Normal arterial flow voids. Skull and upper cervical spine: Enhancing lesion in the lower body of the C2 vertebral body. This shows decreased signal prior to contrast and is consistent with metastatic disease. No fracture or cord compression. No skull lesion identified. Benign fatty lesion in the left parietal bone. Sinuses/Orbits: Paranasal sinuses clear.  Negative orbit Other: None IMPRESSION: Negative for metastatic disease to the brain 1 cm enhancing lesion  C2 vertebral body compatible with metastatic disease. No fracture or cord compression Electronically Signed   By: Franchot Gallo M.D.   On: 06/28/2020 21:31   CT ABDOMEN PELVIS W CONTRAST  Result Date: 07/01/2020 CLINICAL DATA:  Sepsis, abdominal pain, previous kidney transplant x2. Metastatic carcinoma of unknown primary. EXAM: CT ABDOMEN AND PELVIS WITH CONTRAST TECHNIQUE: Multidetector CT imaging of the abdomen and pelvis was performed using the standard protocol following bolus administration of intravenous contrast. CONTRAST:  60mL OMNIPAQUE IOHEXOL 300 MG/ML  SOLN COMPARISON:  06/26/2020 and previous FINDINGS: Lower chest: Trace pleural effusions. Hepatobiliary: Multiple liver lesions. 3.2 cm segment 2 lesion shows definite progression since 05/27/2020, although is more wedge-shaped and may represent focal infarct. Multiple low-attenuation lesions in the right lobe have not convincingly changed allowing for differences in scan timing. There are nonspecific transient hepatic attenuation differences on the portal venous phase which resolve on delayed venous phase imaging. No biliary ductal dilatation. Gallbladder unremarkable. Pancreas: Unremarkable. No pancreatic ductal dilatation or surrounding inflammatory changes. Spleen: Normal in size. There peripheral areas of decreased enhancement which were not present on prior study of 05/27/2020, however this area was not included on the delayed scan, and may represent transient attenuation differences versus true lesions. Adrenals/Urinary Tract: Adrenal glands unremarkable. Marked atrophy of native kidneys. Normal enhancement of left iliac fossa  transplant kidney. Coarse calcifications and low-attenuation in the right iliac fossa presumably related to remote transplant. The urinary bladder is incompletely distended. Stomach/Bowel: Stomach is decompressed. Small bowel is nondilated. The colon is incompletely distended, unremarkable. Vascular/Lymphatic: Bulky  bilateral inguinal, left external iliac, bilateral common iliac, left para-aortic and aortocaval adenopathy as before. The larger nodes contain scattered calcifications and some with central low-attenuation regions as before. Moderate atheromatous aortoiliac plaque without aneurysm. Reproductive: Uterine fibroid.  No adnexal mass. Other: Small volume perihepatic and pelvic ascites slightly increased. No free air. Musculoskeletal: Poorly marginated sclerotic lesions in T12, L1, and L2 vertebral bodies, progressive since 05/16/2020. benign left supra-acetabular bone island stable since 02/03/2015. IMPRESSION: 1. No definite source of sepsis identified. 2. Extensive pelvic and retroperitoneal nodal; hepatic; and osseous metastatic disease. 3. Slight increase in small volume perihepatic and pelvic ascites. Aortic Atherosclerosis (ICD10-I70.0). Electronically Signed   By: Lucrezia Europe M.D.   On: 07/01/2020 14:23   DG Chest Port 1 View  Result Date: 06/26/2020 CLINICAL DATA:  Weakness EXAM: PORTABLE CHEST 1 VIEW COMPARISON:  05/26/2020 FINDINGS: The heart size and mediastinal contours are within normal limits. Both lungs are clear. No pleural effusion or pneumothorax. The visualized skeletal structures are unremarkable. IMPRESSION: No acute process in the chest. Electronically Signed   By: Macy Mis M.D.   On: 06/26/2020 13:45   US Abdomen Limited RUQ (LIVER/GB)  Result Date: 06/27/2020 CLINICAL DATA:  Abdominal pain.  Known liver lesions. EXAM: ULTRASOUND ABDOMEN LIMITED RIGHT UPPER QUADRANT COMPARISON:  Noncontrast abdominal CT yesterday. Contrast-enhanced abdominal CT 05/27/2020 FINDINGS: Gallbladder: Physiologically distended containing intraluminal sludge. No shadowing stones. No wall thickening. No sonographic Murphy sign noted by sonographer. Common bile duct: Diameter: 4-5 mm. Liver: Heterogeneous liver. Many of the liver lesions on prior contrast-enhanced CT are not well seen by ultrasound. There is a  1.8 x 1.5 x 1.5 cm complex hypoechoic lesion in the left lobe. No convincing subcapsular collection is suggested on CT. Portal vein is patent on color Doppler imaging with normal direction of blood flow towards the liver. Other: No right upper quadrant ascites. IMPRESSION: 1. Gallbladder sludge. No gallstones or sonographic findings of acute cholecystitis. 2. No biliary dilatation. 3. Heterogeneous liver with 1.8 cm hypoechoic lesion in the left lobe of the liver. Many of the additional liver lesions on prior contrast-enhanced CT are not well seen by ultrasound. 4. No sonographic evidence of subcapsular collection is suggested on CT. Electronically Signed   By: Keith Rake M.D.   On: 06/27/2020 17:44    ASSESSMENT: 44 yo with   1) Metastatic renal papillary cell carcinoma. Extensive metastases to abdominal pelvic lymph nodes, bones and liver.  Patient with h/o renal transplantation on tacrolimus, prednisone and cellcept chronic immunosuppression with extensive metastatic disease with extensive abd/pelvic LNadenopathy and hepatic metastases.  IHC from LN and liver metastases -- non specific morphology and IC ? Gyn vs renal tubular origin. Tumor markers unrevealing However tissue profiling with cancer type LK-44% certainty of papillary renal cell carcinoma.;  Patient received 1 cycle of carbotaxol and has been switched to Cabometyx. Component     Latest Ref Rng & Units 05/29/2020 05/31/2020  CEA     0.0 - 4.7 ng/mL 0.9   AFP, Serum, Tumor Marker     0.0 - 8.3 ng/mL 1.7   CA 19-9     0 - 35 U/mL 24   Cancer Antigen (CA) 125     0.0 - 38.1 U/mL 20.1   hCG Quant  mIU/mL 3   LDH     98 - 192 U/L  178    2) severe systemic inflammatory response syndrome  3) transaminitis and hyperbilirubinemia  4) AKI with hyperkalemia, improved    PLAN: -Now off antibiotics and remains afebrile.  Cultures unrevealing so far.  Repeat CT abdomen/pelvis on 11/8 with no definite source  of sepsis identified. -Continue on Cabometyx at a reduced dose of 20 mg daily.  Recommend checking daily hepatic function.   -LFTs trending downward.  We will plan to increase Cabometyx to 40 mg p.o. daily next week if continues to be tolerated. -MRI of the brain has been obtained which was negative for metastatic disease to the brain.  It did show a 1 cm enhancing lesion at the C2 vertebral body compatible with metastatic disease. -We will plan to start the patient on Zometa when stable and out of the hospital for metastatic bone disease. -Her case was discussed with her renal transplant physician Aquilla Hacker at [facility] at [phone].  Discussed to determine options for reducing her immune suppression.  He noted there might be advantages in trying to determine if renal cell cancer is often donor kidney origin versus native kidney to determine potential effectiveness of backing off on immune suppression. -I discussed with the patient that there is an important role of backing off on immune suppression to try to help her body fight of the cancer.  The downside risk would be that she could have renal function loss from rejection and may land up on dialysis. -Dr Altamease Oiler will be discussing with his pathologist at [facility] to know what testing to send out to determine donor versus native kidney origin of her renal cell carcinoma. -A trough Prograf level pending -if this is adequate Dr. Altamease Oiler was inclined to possibly consider backing off on the CellCept and then gradually using the least effective dose of Prograf. -Oncology will continue to follow -Would recommend nutritional consultation to optimize p.o. intake and PT OT evaluations to determine discharge needs. -Transfuse as needed for hemoglobin less than 8.   LOS: 8 days   Mikey Bussing NP   ADDENDUM  .Patient was Personally and independently interviewed, examined and relevant elements of the history of present illness were reviewed in details and an  assessment and plan was created. All elements of the patient's history of present illness , assessment and plan were discussed in details with Mikey Bussing NP. The above documentation reflects our combined findings assessment and plan. -Ordered scopolamine patch to try to help with persistent nausea.  Sullivan Lone MD MS

## 2020-07-04 NOTE — Progress Notes (Signed)
PROGRESS NOTE    Candace Griffin  NTI:[id number] DOB: Apr 28, 1976 DOA: 06/26/2020 PCP: Richarda Osmond, DO    Brief Narrative:  [name] was admittedto the hospital with a working diagnosis of systemicinflammatoryresponse syndrome, possible pancreatitis/ acute hepatitis,(ruledout sepsis).  44 year old female with a past medical history for renal failure status post kidney transplant, newly diagnosed metastatic carcinoma, likely renal in origin. Presented with 4 days of intractable nausea, vomiting,abdominal pain, not able to tolerate liquids or oral medications.   Patient was initially placed on broad spectrum antibiotic therapy and received volume resuscitation.   Systemic inflammatory response syndrome triggered by pancreatitis, and possible medication induced hepatitis.   Sepsis has been ruled out.   Assessment & Plan:  Severe systemic inflammatory response syndrome(sepsisruled out). Reason for her presentation is still not entirely clear.  Her lipase level was normal.  LFTs have been improving.  Abdominal pain seems to be better though she still requiring a lot of analgesic agents.  Continue with PPI and sucralfate.    Abdominal pain with nausea and vomiting Suspect that her abdominal pain and vomiting is likely due to the metastatic disease noted in the abdomen.  Requiring a lot of analgesic agents.  She continues to have vomiting.  She has vomited twice in the last 12 hours.  CT scan done a few days ago did not show any obstruction but did show tumor burden. We will check a cortisol level in the morning.  Acute kidney injury likely prerenal/anion gap metabolic acidosis/lactic acidosis/electrolyte imbalances Proved with hydration.  Seems to be at baseline.  Avoid nephrotoxic agents.  Potassium is normal today.     Acute Hepatitis/Elevated liver enzymes/metastatic carcinoma likely renal primary/drug induced hepatitis. US liver with no gallbladder  stone or sonographic signs of acute cholecystitis. 1.8x1.5x1.5 cm hypoechoic lesion left lobe of the liver. Acute viral hepatitis negative. Acetaminophen level 10. LFTs continue to improve.  She was started back on the cabozantinib.  Status post kidney transplant Continue with prednisone,mycophenolate and tacrolimus. Previous rounding MD spoke with Dr. Aquilla Hacker from renal transplant at [facility], plan to continue current immunosuppressive regimen for now.  Tacrolimus level is still pending.   Plan to call the transfer center [phone] with the results.  May need to alter her treatment strategy since it looks like her malignancy may be renal in origin.  Anxiety Continue with as neededlorazepam.  Chronic anemia normocytic likely malignancy related/leukocytosis Hemoglobin is low but stable.  WBC has been elevated and noted to be better.  CT scan done on 11/8 did not show any evidence for infection in the abdominal cavity.   DVT prophylaxis: Enoxaparin   Code Status: Full code Family Communication:  No family at the bedside, she has been communicating with her family over the phone.   Disposition: Patient needs to be mobilized.  She was encouraged to work with physical therapy.  She lives at home with her children.  Her mother and brother are able to assist her.  Hopefully she will be able to get back home when improved.  Status is: Inpatient  Remains inpatient appropriate because:Ongoing active pain requiring inpatient pain management, IV treatments appropriate due to intensity of illness or inability to take PO and Inpatient level of care appropriate due to severity of illness   Dispo: The patient is from: Home              Anticipated d/c is to: Home  Anticipated d/c date is: 2 days              Patient currently is not medically stable to d/c.      Consultants:   Oncology     Subjective: Continues to have abdominal pain with nausea and vomiting.  Denies  any shortness of breath.  No chest pain.  Objective: Vitals:   07/03/20 1556 07/03/20 2041 07/04/20 0625 07/04/20 0957  BP: (!) 131/95 (!) 135/95 (!) 134/93 (!) 122/92  Pulse: (!) 119 (!) 122 (!) 127 (!) 127  Resp: 14 14 16 14   Temp: 98.2 F (36.8 C) 97.8 F (36.6 C) 98.7 F (37.1 C) 98.3 F (36.8 C)  TempSrc: Oral Oral  Oral  SpO2: 99% 100% 99% 100%  Weight:      Height:        Intake/Output Summary (Last 24 hours) at 07/04/2020 1018 Last data filed at 07/04/2020 0700 Gross per 24 hour  Intake 688.61 ml  Output --  Net 688.61 ml   Filed Weights   06/26/20 1815  Weight: 57.3 kg    Examination:   General appearance:  In no distress.  Ill-appearing.  Lethargic at times. Resp: Clear to auscultation bilaterally.  Normal effort Cardio: S1-S2 is tachycardic regular.  No S3-S4.  No rubs or bruit. GI: Abdomen is soft.  Tender diffusely without any rebound rigidity or guarding.  No masses organomegaly.   Extremities: No edema.  Able to move her extremities Neurologic: Alert and oriented x3.  No focal neurological deficits.      Data Reviewed: I have personally reviewed following labs and imaging studies  CBC: Recent Labs  Lab 06/28/20 0528 06/28/20 0528 06/29/20 0725 06/30/20 0652 07/01/20 0554 07/03/20 0401 07/04/20 0429  WBC 35.0*   < > 24.3* 24.4* 21.9* 15.9* 14.8*  NEUTROABS 30.9*  --  20.2* 20.0* 17.8* 12.7*  --   HGB 9.6*   < > 8.6* 8.8* 8.5* 8.3* 8.2*  HCT 31.3*   < > 27.9* 28.5* 28.1* 25.9* 26.0*  MCV 83.7   < > 83.3 84.1 83.9 82.2 83.1  PLT 187   < > 140* 156 155 189 202   < > = values in this interval not displayed.   Basic Metabolic Panel: Recent Labs  Lab 06/29/20 0725 06/30/20 0652 07/01/20 0554 07/03/20 0401 07/04/20 0429  NA 136 139 136 135 137  K 3.2* 3.6 4.9 3.7 4.6  CL 104 109 106 107 107  CO2 20* 21* 19* 21* 21*  GLUCOSE 146* 144* 102* 127* 102*  BUN 25* 23* 19 14 13   CREATININE 1.29* 1.27* 1.04* 0.98 1.05*  CALCIUM 8.4* 8.4*  8.3* 8.0* 8.3*  MG  --  1.8  --   --   --    GFR: Estimated Creatinine Clearance: 62.5 mL/min (A) (by C-G formula based on SCr of 1.05 mg/dL (H)). Liver Function Tests: Recent Labs  Lab 06/29/20 0725 06/30/20 0652 07/01/20 0554 07/03/20 0401 07/04/20 0429  AST 555* 267* 158* 65* 57*  ALT 400* 292* 228* 124* 97*  ALKPHOS 373* 354* 389* 298* 287*  BILITOT 3.9* 3.8* 4.6* 4.0* 4.1*  PROT 5.4* 5.4* 5.5* 5.6* 5.7*  ALBUMIN 2.3* 2.2* 2.2* 2.0* 2.0*      Scheduled Meds: . cabozantinib  20 mg Oral Daily  . enoxaparin (LOVENOX) injection  40 mg Subcutaneous QHS  . mouth rinse  15 mL Mouth Rinse BID  . mycophenolate  500 mg Oral BID  . oxyCODONE  15 mg  Oral BID  . pantoprazole  40 mg Oral BID  . predniSONE  5 mg Oral Q breakfast  . promethazine  12.5 mg Intravenous TID AC  . sodium chloride flush  10-40 mL Intracatheter Q12H  . sucralfate  1 g Oral TID WC & HS  . Tacrolimus ER  4 mg Oral Daily   Continuous Infusions: . dextrose 5% lactated ringers 10 mL/hr at 07/03/20 1344     LOS: 8 days    Bonnielee Haff, MD  [facility] Pager: On Amion

## 2020-07-04 NOTE — Progress Notes (Signed)
   07/04/20 1234  Assess: MEWS Score  Temp 98.2 F (36.8 C)  BP (!) 127/92  Pulse Rate (!) 130  Resp 14  Level of Consciousness Alert  SpO2 100 %  O2 Device Room Air  Assess: MEWS Score  MEWS Temp 0  MEWS Systolic 0  MEWS Pulse 3  MEWS RR 0  MEWS LOC 0  MEWS Score 3  MEWS Score Color Yellow  Assess: if the MEWS score is Yellow or Red  Were vital signs taken at a resting state? Yes  Focused Assessment No change from prior assessment  Early Detection of Sepsis Score *See Row Information* Low  MEWS guidelines implemented *See Row Information* No, previously yellow, continue vital signs every 4 hours

## 2020-07-05 DIAGNOSIS — D649 Anemia, unspecified: Secondary | ICD-10-CM | POA: Diagnosis not present

## 2020-07-05 DIAGNOSIS — R109 Unspecified abdominal pain: Secondary | ICD-10-CM | POA: Diagnosis not present

## 2020-07-05 DIAGNOSIS — C787 Secondary malignant neoplasm of liver and intrahepatic bile duct: Secondary | ICD-10-CM | POA: Diagnosis not present

## 2020-07-05 DIAGNOSIS — K759 Inflammatory liver disease, unspecified: Secondary | ICD-10-CM | POA: Diagnosis not present

## 2020-07-05 DIAGNOSIS — C649 Malignant neoplasm of unspecified kidney, except renal pelvis: Secondary | ICD-10-CM | POA: Diagnosis not present

## 2020-07-05 LAB — CBC
HCT: 23.5 % — ABNORMAL LOW (ref 36.0–46.0)
Hemoglobin: 7.4 g/dL — ABNORMAL LOW (ref 12.0–15.0)
MCH: 25.9 pg — ABNORMAL LOW (ref 26.0–34.0)
MCHC: 31.5 g/dL (ref 30.0–36.0)
MCV: 82.2 fL (ref 80.0–100.0)
Platelets: 203 10*3/uL (ref 150–400)
RBC: 2.86 MIL/uL — ABNORMAL LOW (ref 3.87–5.11)
RDW: 25.3 % — ABNORMAL HIGH (ref 11.5–15.5)
WBC: 14.1 10*3/uL — ABNORMAL HIGH (ref 4.0–10.5)
nRBC: 0.3 % — ABNORMAL HIGH (ref 0.0–0.2)

## 2020-07-05 LAB — COMPREHENSIVE METABOLIC PANEL
ALT: 77 U/L — ABNORMAL HIGH (ref 0–44)
AST: 57 U/L — ABNORMAL HIGH (ref 15–41)
Albumin: 1.9 g/dL — ABNORMAL LOW (ref 3.5–5.0)
Alkaline Phosphatase: 280 U/L — ABNORMAL HIGH (ref 38–126)
Anion gap: 10 (ref 5–15)
BUN: 15 mg/dL (ref 6–20)
CO2: 21 mmol/L — ABNORMAL LOW (ref 22–32)
Calcium: 8.2 mg/dL — ABNORMAL LOW (ref 8.9–10.3)
Chloride: 107 mmol/L (ref 98–111)
Creatinine, Ser: 0.95 mg/dL (ref 0.44–1.00)
GFR, Estimated: >60 mL/min (ref >60)
Glucose, Bld: 84 mg/dL (ref 70–99)
Potassium: 4.3 mmol/L (ref 3.5–5.1)
Sodium: 138 mmol/L (ref 135–145)
Total Bilirubin: 4.2 mg/dL — ABNORMAL HIGH (ref 0.3–1.2)
Total Protein: 5.6 g/dL — ABNORMAL LOW (ref 6.5–8.1)

## 2020-07-05 LAB — CORTISOL-AM, BLOOD: Cortisol - AM: 10.6 ug/dL (ref 6.7–22.6)

## 2020-07-05 LAB — PREPARE RBC (CROSSMATCH)

## 2020-07-05 MED ORDER — SODIUM CHLORIDE 0.9% IV SOLUTION: Freq: Once | INTRAVENOUS | Status: DC

## 2020-07-05 NOTE — Progress Notes (Signed)
OT Cancellation Note  Patient Details Name: Candace Griffin MRN: [id number] DOB: 29-Aug-1975   Cancelled Treatment:    Reason Eval/Treat Not Completed: Other (comment). Attempted therapy twice today. This morning patient asked therapist to come back at 2:00 pm. At 2, patient reports she is too sleepy to participate. Patient has declined multiple times. May need to sign off at next visit if patient not wanting to participate.  [name] [name] 07/05/2020, 2:44 PM

## 2020-07-05 NOTE — Progress Notes (Signed)
PROGRESS NOTE    Candace Griffin  GUY:[id number] DOB: 02/27/1976 DOA: 06/26/2020 PCP: Richarda Osmond, DO    Brief Narrative:  44 year old female with a past medical history for renal failure status post kidney transplant, newly diagnosed metastatic carcinoma, likely renal in origin. Presented with 4 days of intractable nausea, vomiting,abdominal pain, not able to tolerate liquids or oral medications.   Patient was initially placed on broad spectrum antibiotic therapy and received volume resuscitation.   Systemic inflammatory response syndrome triggered by pancreatitis, and possible medication induced hepatitis.   Sepsis has been ruled out.   Assessment & Plan:  Abdominal pain with nausea and vomiting Suspect that her abdominal pain and vomiting is likely due to the metastatic disease noted in the abdomen.  Continues to have a lot of nausea and vomiting and poor oral intake is resolved.  Requiring a lot of analgesic agents.  CT scan done a few days ago did not show any obstruction but did show tumor burden. Cortisol level noted to be 10.6.  Acute kidney injury likely prerenal/anion gap metabolic acidosis/lactic acidosis/electrolyte imbalances Improved with hydration.  Seems to be at baseline.  Avoid nephrotoxic agents.  Electrolytes periodically.  Noted to be normal today.  Acute Hepatitis/Elevated liver enzymes/metastatic carcinoma likely renal primary/drug induced hepatitis. US liver with no gallbladder stone or sonographic signs of acute cholecystitis. 1.8x1.5x1.5 cm hypoechoic lesion left lobe of the liver. Acute viral hepatitis negative. Acetaminophen level 10. LFTs continue to improve.  She was started back on the cabozantinib.  Severe systemic inflammatory response syndrome(sepsisruled out). Reason for her presentation is still not entirely clear.  Her lipase level was normal.  LFTs have been improving.  Abdominal pain seems to be better though she still  requiring a lot of analgesic agents.  Continue with PPI and sucralfate.    Status post kidney transplant Continue with prednisone,mycophenolate and tacrolimus. Previous rounding MD spoke with Dr. Aquilla Hacker from renal transplant at [facility], plan to continue current immunosuppressive regimen for now.  Tacrolimus level remains pending. Plan to call the transfer center [phone] with the results.  May need to alter her treatment strategy since it looks like her malignancy may be renal in origin. Medical oncology, Dr. Irene Limbo, has also been in touch with Dr. Sharla Kidney.  Anxiety Continue with as neededlorazepam.  Chronic anemia normocytic likely malignancy related/leukocytosis Hemoglobin noted to be 7.4 this morning.  No overt bleeding noted.  Hematology/oncology recommends transfusion for less than 8.  We will order 1 unit of blood today.  WBC slowly improving.  Reason for leukocytosis not clear.  No evidence for this infection has been found so far.   DVT prophylaxis: Enoxaparin   Code Status: Full code Family Communication:  No family at the bedside, she has been communicating with her family over the phone.   Disposition: Patient needs to be mobilized.  She was encouraged to work with physical therapy.  She lives at home with her children.  Her mother and brother are able to assist her.  Hopefully she will be able to get back home when improved.  Status is: Inpatient  Remains inpatient appropriate because:Ongoing active pain requiring inpatient pain management, IV treatments appropriate due to intensity of illness or inability to take PO and Inpatient level of care appropriate due to severity of illness   Dispo: The patient is from: Home              Anticipated d/c is to: Home  Anticipated d/c date is: 2 days              Patient currently is not medically stable to d/c.      Consultants:   Oncology     Subjective: Continues to have abdominal pain.  She not  really taking any solid foods by mouth.  Able to keep yourself hydrated with liquids.  Continues to have nausea.  Had 3 episodes of vomiting yesterday.    Objective: Vitals:   07/04/20 1754 07/04/20 2119 07/05/20 0204 07/05/20 0622  BP: (!) 127/93 (!) 137/95 (!) 126/95 (!) 136/93  Pulse: (!) 124 (!) 120 (!) 120 (!) 126  Resp: 14 19 20 20   Temp: 98 F (36.7 C) 98.1 F (36.7 C) 98.3 F (36.8 C) 98.6 F (37 C)  TempSrc: Oral Oral Oral Oral  SpO2: 100% 100% 100% 100%  Weight:      Height:        Intake/Output Summary (Last 24 hours) at 07/05/2020 1130 Last data filed at 07/05/2020 0600 Gross per 24 hour  Intake 589.24 ml  Output --  Net 589.24 ml   Filed Weights   06/26/20 1815  Weight: 57.3 kg    Examination:   General appearance: Ill-appearing.  Lethargic.  Easily arousable. Resp: Clear to auscultation bilaterally.  Normal effort Cardio: S1-S2 is normal regular.  No S3-S4.  No rubs murmurs or bruit GI: Abdomen is soft but tender diffusely.  No distention noted.  No rebound rigidity or guarding.  Bowel sounds present  Extremities: No edema.  Moving all her extremities Neurologic: Alert and oriented x3.  No focal neurological deficits.     Data Reviewed: I have personally reviewed following labs and imaging studies  CBC: Recent Labs  Lab 06/29/20 0725 06/29/20 0725 06/30/20 0626 07/01/20 0554 07/03/20 0401 07/04/20 0429 07/05/20 0348  WBC 24.3*   < > 24.4* 21.9* 15.9* 14.8* 14.1*  NEUTROABS 20.2*  --  20.0* 17.8* 12.7*  --   --   HGB 8.6*   < > 8.8* 8.5* 8.3* 8.2* 7.4*  HCT 27.9*   < > 28.5* 28.1* 25.9* 26.0* 23.5*  MCV 83.3   < > 84.1 83.9 82.2 83.1 82.2  PLT 140*   < > 156 155 189 202 203   < > = values in this interval not displayed.   Basic Metabolic Panel: Recent Labs  Lab 06/30/20 0652 07/01/20 0554 07/03/20 0401 07/04/20 0429 07/05/20 0348  NA 139 136 135 137 138  K 3.6 4.9 3.7 4.6 4.3  CL 109 106 107 107 107  CO2 21* 19* 21* 21* 21*   GLUCOSE 144* 102* 127* 102* 84  BUN 23* 19 14 13 15   CREATININE 1.27* 1.04* 0.98 1.05* 0.95  CALCIUM 8.4* 8.3* 8.0* 8.3* 8.2*  MG 1.8  --   --   --   --    GFR: Estimated Creatinine Clearance: 69.1 mL/min (by C-G formula based on SCr of 0.95 mg/dL). Liver Function Tests: Recent Labs  Lab 06/30/20 0652 07/01/20 0554 07/03/20 0401 07/04/20 0429 07/05/20 0348  AST 267* 158* 65* 57* 57*  ALT 292* 228* 124* 97* 77*  ALKPHOS 354* 389* 298* 287* 280*  BILITOT 3.8* 4.6* 4.0* 4.1* 4.2*  PROT 5.4* 5.5* 5.6* 5.7* 5.6*  ALBUMIN 2.2* 2.2* 2.0* 2.0* 1.9*      Scheduled Meds: . cabozantinib  20 mg Oral Daily  . enoxaparin (LOVENOX) injection  40 mg Subcutaneous QHS  . mouth rinse  15 mL Mouth  Rinse BID  . mycophenolate  500 mg Oral BID  . oxyCODONE  15 mg Oral BID  . pantoprazole  40 mg Oral BID  . predniSONE  5 mg Oral Q breakfast  . promethazine  12.5 mg Intravenous TID AC  . scopolamine  1 patch Transdermal Q72H  . sodium chloride flush  10-40 mL Intracatheter Q12H  . sucralfate  1 g Oral TID WC & HS  . Tacrolimus ER  4 mg Oral Daily   Continuous Infusions: . dextrose 5% lactated ringers 10 mL/hr at 07/03/20 1344     LOS: 9 days    Bonnielee Haff, MD  [facility] Pager: On Amion

## 2020-07-05 NOTE — Progress Notes (Addendum)
HEMATOLOGY-ONCOLOGY PROGRESS NOTE  SUBJECTIVE:   The patient reports that she feels better this morning.  Abdominal pain has resolved.  She had some nausea and vomiting yesterday but none so far today.  Oncology History  Metastatic carcinoma to liver ([place])  05/30/2020 Initial Diagnosis   Metastatic carcinoma to liver ([place])   06/13/2020 -  Chemotherapy   The patient had dexamethasone (DECADRON) 4 MG tablet, 8 mg, Oral, Daily, 1 of 1 cycle, Start date: 06/10/2020, End date: -- palonosetron (ALOXI) injection 0.25 mg, 0.25 mg, Intravenous,  Once, 1 of 4 cycles Administration: 0.25 mg (06/13/2020) pegfilgrastim-jmdb (FULPHILA) injection 6 mg, 6 mg, Subcutaneous,  Once, 1 of 4 cycles Administration: 6 mg (06/15/2020) CARBOplatin (PARAPLATIN) 390 mg in sodium chloride 0.9 % 250 mL chemo infusion, 390 mg (78.9 % of original dose 491.5 mg), Intravenous,  Once, 1 of 4 cycles Dose modification:   (original dose 491.5 mg, Cycle 1) Administration: 390 mg (06/13/2020) fosaprepitant (EMEND) 150 mg in sodium chloride 0.9 % 145 mL IVPB, 150 mg, Intravenous,  Once, 1 of 4 cycles Administration: 150 mg (06/13/2020) PACLitaxel (TAXOL) 54 mg in sodium chloride 0.9 % 150 mL chemo infusion (> 80mg /m2), 30 mg/m2 = 54 mg (60 % of original dose 50 mg/m2), Intravenous,  Once, 1 of 4 cycles Dose modification: 50 mg/m2 (original dose 50 mg/m2, Cycle 1, Reason: Change in LFTs), 30 mg/m2 (original dose 50 mg/m2, Cycle 1, Reason: Change in LFTs) Administration: 54 mg (06/13/2020)  for chemotherapy treatment.    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 with unknown primary site [facility])  06/10/2020 Initial Diagnosis   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 with unknown primary site [facility])   06/13/2020 -  Chemotherapy   The patient had dexamethasone (DECADRON) 4 MG tablet, 8 mg, Oral, Daily, 1 of 1 cycle, Start date: 06/10/2020, End date: -- palonosetron (ALOXI) injection 0.25 mg, 0.25 mg,  Intravenous,  Once, 1 of 4 cycles Administration: 0.25 mg (06/13/2020) pegfilgrastim-jmdb (FULPHILA) injection 6 mg, 6 mg, Subcutaneous,  Once, 1 of 4 cycles Administration: 6 mg (06/15/2020) CARBOplatin (PARAPLATIN) 390 mg in sodium chloride 0.9 % 250 mL chemo infusion, 390 mg (78.9 % of original dose 491.5 mg), Intravenous,  Once, 1 of 4 cycles Dose modification:   (original dose 491.5 mg, Cycle 1) Administration: 390 mg (06/13/2020) fosaprepitant (EMEND) 150 mg in sodium chloride 0.9 % 145 mL IVPB, 150 mg, Intravenous,  Once, 1 of 4 cycles Administration: 150 mg (06/13/2020) PACLitaxel (TAXOL) 54 mg in sodium chloride 0.9 % 150 mL chemo infusion (> 80mg /m2), 30 mg/m2 = 54 mg (60 % of original dose 50 mg/m2), Intravenous,  Once, 1 of 4 cycles Dose modification: 50 mg/m2 (original dose 50 mg/m2, Cycle 1, Reason: Change in LFTs), 30 mg/m2 (original dose 50 mg/m2, Cycle 1, Reason: Change in LFTs) Administration: 54 mg (06/13/2020)  for chemotherapy treatment.       REVIEW OF SYSTEMS:   .10 Point review of Systems was done is negative except as noted above.    PHYSICAL EXAMINATION: ECOG PERFORMANCE STATUS: 2 - Symptomatic, <50% confined to bed  Vitals:   07/05/20 0204 07/05/20 0622  BP: (!) 126/95 (!) 136/93  Pulse: (!) 120 (!) 126  Resp: 20 20  Temp: 98.3 F (36.8 C) 98.6 F (37 C)  SpO2: 100% 100%   Filed Weights   06/26/20 1815  Weight: 57.3 kg    Intake/Output from previous day: 11/11 0701 - 11/12 0700 In: 589.2 [P.O.:360; I.V.:229.2] Out: -   .  GENERAL:alert, in no acute distress and comfortable SKIN: no acute rashes, no significant lesions EYES: conjunctiva are pink and non-injected, sclera anicteric OROPHARYNX: MMM, no exudates, no oropharyngeal erythema or ulceration NECK: supple, no JVD LYMPH:  no palpable lymphadenopathy in the cervical, axillary or inguinal regions LUNGS: clear to auscultation b/l with normal respiratory effort HEART: regular rate &  rhythm ABDOMEN:  normoactive bowel sounds, no tenderness to palpation Extremity: no pedal edema PSYCH: alert & oriented x 3 with fluent speech NEURO: no focal motor/sensory deficits   LABORATORY DATA:  I have reviewed the data as listed CMP Latest Ref Rng & Units 07/03/2020 07/01/2020  Glucose 70 - 99 mg/dL 127(H) 102(H)  BUN 6 - 20 mg/dL 14 19  Creatinine 0.44 - 1.00 mg/dL 0.98 1.04(H)  Sodium 135 - 145 mmol/L 135 136  Potassium 3.5 - 5.1 mmol/L 3.7 4.9  Chloride 98 - 111 mmol/L 107 106  CO2 22 - 32 mmol/L 21(L) 19(L)  Calcium 8.9 - 10.3 mg/dL 8.0(L) 8.3(L)  Total Protein 6.5 - 8.1 g/dL 5.6(L) 5.5(L)  Total Bilirubin 0.3 - 1.2 mg/dL 4.0(H) 4.6(H)  Alkaline Phos 38 - 126 U/L 298(H) 389(H)  AST 15 - 41 U/L 65(H) 158(H)  ALT 0 - 44 U/L 124(H) 228(H)   . CBC Latest Ref Rng & Units 07/03/2020 07/01/2020  WBC 4.0 - 10.5 K/uL 15.9(H) 21.9(H)  Hemoglobin 12.0 - 15.0 g/dL 8.3(L) 8.5(L)  Hematocrit 36 - 46 % 25.9(L) 28.1(L)  Platelets 150 - 400 K/uL 189 155   CT ABDOMEN PELVIS WO CONTRAST  Result Date: 06/26/2020 CLINICAL DATA:  Diffuse abdominal pain and vomiting. History of cancer and renal transplant. Liver mass post recent liver biopsy. EXAM: CT ABDOMEN AND PELVIS WITHOUT CONTRAST TECHNIQUE: Multidetector CT imaging of the abdomen and pelvis was performed following the standard protocol without IV contrast. COMPARISON:  Contrast-enhanced exam 05/27/2020. FINDINGS: Lower chest: No focal airspace disease, pleural fluid, or pulmonary nodularity. Heart is normal in size. Hepatobiliary: Patient's known hepatic lesions are not well demonstrated on noncontrast exam. Liver parenchyma is heterogeneous. There may be a small subcapsular hematoma inferiorly from recent liver biopsy, not well-defined on this noncontrast exam. The gallbladder is present. There is high-density material in the gallbladder lumen, possibly sludge. No calcified gallstone. Common bile duct is poorly defined, but no evidence of  biliary dilatation. Pancreas: No ductal dilatation or inflammation. Spleen: Normal in size without focal abnormality. Adrenals/Urinary Tract: Normal adrenal glands. Chronic bilateral native renal atrophy. Solid-appearing lesion in the lower left renal hilum was better appreciated on prior contrast-enhanced CT, grossly unchanged, series 2, image 35. Transplant kidney in the left lower quadrant. Scarring in the left lower pole is better appreciated on prior contrast-enhanced exam. Similar fullness of the renal collecting system without frank hydronephrosis. No transplant perinephric edema. Sequela of failed transplant in the right iliac fossa. The urinary bladder is unremarkable. Stomach/Bowel: Bowel evaluation is limited in the absence of enteric contrast. There is no bowel obstruction or evidence of bowel inflammation. The appendix is normal. Vascular/Lymphatic: Bulky retroperitoneal adenopathy, some of which is low-density and typical of necrosis. Occasional areas of nodal calcification. Adenopathy extends from the retrocrural space to the bilateral common iliac arteries, and left external iliac station. There also enlarged bilateral inguinal lymph nodes, left greater than right. Index aortocaval node measures 4.1 x 2.4 cm, previously 4.2 x 2.6 cm. Overall adenopathy is not significantly changed in the interim allowing for noncontrast technique. There is aortic and branch atherosclerosis. Reproductive: Bulky uterus with  fibroids.  No obvious adnexal mass. Other: There is trace free fluid in the pelvis, slightly increased from prior exam. No other free fluid or ascites. No free air. Musculoskeletal: Ill-defined sclerosis involving right L2 vertebral body, nonspecific but unchanged from previous. Sclerotic density in the left acetabulum. There is hemi transitional lumbosacral anatomy. No fracture or acute osseous abnormality. IMPRESSION: 1. Patient's known hepatic lesions are not well-defined on this noncontrast  exam. There may be a small subcapsular hematoma inferiorly from recent liver biopsy, not well-defined. No evidence of hemoperitoneum. 2. Bulky retroperitoneal and bilateral inguinal adenopathy, grossly unchanged from prior. 3. Transplant kidney in the left lower quadrant with unchanged fullness of the renal collecting system. Sequela of failed transplant in the right iliac fossa. Stable soft tissue density in the region of the native left kidney lower hilum. 4. Bulky uterus with fibroids. 5. Nonspecific sclerosis involving the right aspect of L2 vertebral body. Aortic Atherosclerosis (ICD10-I70.0). Electronically Signed   By: Keith Rake M.D.   On: 06/26/2020 16:45   MR Brain W Wo Contrast  Result Date: 06/28/2020 CLINICAL DATA:  Urologic cancer, staging EXAM: MRI HEAD WITHOUT AND WITH CONTRAST TECHNIQUE: Multiplanar, multiecho pulse sequences of the brain and surrounding structures were obtained without and with intravenous contrast. CONTRAST:  60mL GADAVIST GADOBUTROL 1 MMOL/ML IV SOLN COMPARISON:  MRI head 11/17/2016 FINDINGS: Brain: No acute infarction, hemorrhage, hydrocephalus, extra-axial collection or mass lesion. Normal white matter. Negative for metastatic disease to the brain. No enhancing mass lesion in the brain. Vascular: Normal arterial flow voids. Skull and upper cervical spine: Enhancing lesion in the lower body of the C2 vertebral body. This shows decreased signal prior to contrast and is consistent with metastatic disease. No fracture or cord compression. No skull lesion identified. Benign fatty lesion in the left parietal bone. Sinuses/Orbits: Paranasal sinuses clear.  Negative orbit Other: None IMPRESSION: Negative for metastatic disease to the brain 1 cm enhancing lesion C2 vertebral body compatible with metastatic disease. No fracture or cord compression Electronically Signed   By: Franchot Gallo M.D.   On: 06/28/2020 21:31   CT ABDOMEN PELVIS W CONTRAST  Result Date:  07/01/2020 CLINICAL DATA:  Sepsis, abdominal pain, previous kidney transplant x2. Metastatic carcinoma of unknown primary. EXAM: CT ABDOMEN AND PELVIS WITH CONTRAST TECHNIQUE: Multidetector CT imaging of the abdomen and pelvis was performed using the standard protocol following bolus administration of intravenous contrast. CONTRAST:  20mL OMNIPAQUE IOHEXOL 300 MG/ML  SOLN COMPARISON:  06/26/2020 and previous FINDINGS: Lower chest: Trace pleural effusions. Hepatobiliary: Multiple liver lesions. 3.2 cm segment 2 lesion shows definite progression since 05/27/2020, although is more wedge-shaped and may represent focal infarct. Multiple low-attenuation lesions in the right lobe have not convincingly changed allowing for differences in scan timing. There are nonspecific transient hepatic attenuation differences on the portal venous phase which resolve on delayed venous phase imaging. No biliary ductal dilatation. Gallbladder unremarkable. Pancreas: Unremarkable. No pancreatic ductal dilatation or surrounding inflammatory changes. Spleen: Normal in size. There peripheral areas of decreased enhancement which were not present on prior study of 05/27/2020, however this area was not included on the delayed scan, and may represent transient attenuation differences versus true lesions. Adrenals/Urinary Tract: Adrenal glands unremarkable. Marked atrophy of native kidneys. Normal enhancement of left iliac fossa transplant kidney. Coarse calcifications and low-attenuation in the right iliac fossa presumably related to remote transplant. The urinary bladder is incompletely distended. Stomach/Bowel: Stomach is decompressed. Small bowel is nondilated. The colon is incompletely distended, unremarkable. Vascular/Lymphatic: Bulky  bilateral inguinal, left external iliac, bilateral common iliac, left para-aortic and aortocaval adenopathy as before. The larger nodes contain scattered calcifications and some with central low-attenuation  regions as before. Moderate atheromatous aortoiliac plaque without aneurysm. Reproductive: Uterine fibroid.  No adnexal mass. Other: Small volume perihepatic and pelvic ascites slightly increased. No free air. Musculoskeletal: Poorly marginated sclerotic lesions in T12, L1, and L2 vertebral bodies, progressive since 05/16/2020. benign left supra-acetabular bone island stable since 02/03/2015. IMPRESSION: 1. No definite source of sepsis identified. 2. Extensive pelvic and retroperitoneal nodal; hepatic; and osseous metastatic disease. 3. Slight increase in small volume perihepatic and pelvic ascites. Aortic Atherosclerosis (ICD10-I70.0). Electronically Signed   By: Lucrezia Europe M.D.   On: 07/01/2020 14:23   DG Chest Port 1 View  Result Date: 06/26/2020 CLINICAL DATA:  Weakness EXAM: PORTABLE CHEST 1 VIEW COMPARISON:  05/26/2020 FINDINGS: The heart size and mediastinal contours are within normal limits. Both lungs are clear. No pleural effusion or pneumothorax. The visualized skeletal structures are unremarkable. IMPRESSION: No acute process in the chest. Electronically Signed   By: Macy Mis M.D.   On: 06/26/2020 13:45   US Abdomen Limited RUQ (LIVER/GB)  Result Date: 06/27/2020 CLINICAL DATA:  Abdominal pain.  Known liver lesions. EXAM: ULTRASOUND ABDOMEN LIMITED RIGHT UPPER QUADRANT COMPARISON:  Noncontrast abdominal CT yesterday. Contrast-enhanced abdominal CT 05/27/2020 FINDINGS: Gallbladder: Physiologically distended containing intraluminal sludge. No shadowing stones. No wall thickening. No sonographic Murphy sign noted by sonographer. Common bile duct: Diameter: 4-5 mm. Liver: Heterogeneous liver. Many of the liver lesions on prior contrast-enhanced CT are not well seen by ultrasound. There is a 1.8 x 1.5 x 1.5 cm complex hypoechoic lesion in the left lobe. No convincing subcapsular collection is suggested on CT. Portal vein is patent on color Doppler imaging with normal direction of blood flow  towards the liver. Other: No right upper quadrant ascites. IMPRESSION: 1. Gallbladder sludge. No gallstones or sonographic findings of acute cholecystitis. 2. No biliary dilatation. 3. Heterogeneous liver with 1.8 cm hypoechoic lesion in the left lobe of the liver. Many of the additional liver lesions on prior contrast-enhanced CT are not well seen by ultrasound. 4. No sonographic evidence of subcapsular collection is suggested on CT. Electronically Signed   By: Keith Rake M.D.   On: 06/27/2020 17:44    ASSESSMENT: 44 yo with   1) Metastatic renal papillary cell carcinoma. Extensive metastases to abdominal pelvic lymph nodes, bones and liver.  Patient with h/o renal transplantation on tacrolimus, prednisone and cellcept chronic immunosuppression with extensive metastatic disease with extensive abd/pelvic LNadenopathy and hepatic metastases.  IHC from LN and liver metastases -- non specific morphology and IC ? Gyn vs renal tubular origin. Tumor markers unrevealing However tissue profiling with cancer type [id number]% certainty of papillary renal cell carcinoma.;  Patient received 1 cycle of carbotaxol and has been switched to Cabometyx. Component     Latest Ref Rng & Units 05/29/2020 05/31/2020  CEA     0.0 - 4.7 ng/mL 0.9   AFP, Serum, Tumor Marker     0.0 - 8.3 ng/mL 1.7   CA 19-9     0 - 35 U/mL 24   Cancer Antigen (CA) 125     0.0 - 38.1 U/mL 20.1   hCG Quant     mIU/mL 3   LDH     98 - 192 U/L  178    2) severe systemic inflammatory response syndrome  3) transaminitis and hyperbilirubinemia  4) AKI with hyperkalemia,  improved    PLAN: -Now off antibiotics and remains afebrile.  Cultures unrevealing so far.  Repeat CT abdomen/pelvis on 11/8 with no definite source of sepsis identified. -Continue on Cabometyx at a reduced dose of 20 mg daily.  Recommend checking daily hepatic function.   -LFTs trending downward.  We will plan to increase Cabometyx to 40 mg p.o.  daily next week if continues to be tolerated. -MRI of the brain has been obtained which was negative for metastatic disease to the brain.  It did show a 1 cm enhancing lesion at the C2 vertebral body compatible with metastatic disease. -We will plan to start the patient on Zometa when stable and out of the hospital for metastatic bone disease. -Her case was discussed with her renal transplant physician Aquilla Hacker at [facility] at [phone].  Discussed to determine options for reducing her immune suppression.  He noted there might be advantages in trying to determine if renal cell cancer is often donor kidney origin versus native kidney to determine potential effectiveness of backing off on immune suppression. -I discussed with the patient that there is an important role of backing off on immune suppression to try to help her body fight of the cancer.  The downside risk would be that she could have renal function loss from rejection and may land up on dialysis. -Dr Altamease Oiler will be discussing with his pathologist at [facility] to know what testing to send out to determine donor versus native kidney origin of her renal cell carcinoma. -A trough Prograf level pending -if this is adequate Dr. Altamease Oiler was inclined to possibly consider backing off on the CellCept and then gradually using the least effective dose of Prograf. -Oncology will continue to follow -Would recommend nutritional consultation to optimize p.o. intake and PT OT evaluations to determine discharge needs. -Transfuse as needed for hemoglobin less than 8.  Hemoglobin is 7.4 today and recommend 1 unit PRBCs.   LOS: 9 days  Mikey Bussing NP  ADDENDUM  .Patient was Personally and independently interviewed, examined and relevant elements of the history of present illness were reviewed in details and an assessment and plan was created. All elements of the patient's history of present illness , assessment and plan were discussed in details with Mikey Bussing NP. The above documentation reflects our combined findings assessment and plan.  Sullivan Lone MD MS

## 2020-07-05 NOTE — Progress Notes (Signed)
Called Lisa in the blood bank regarding the blood transfusion.  It is not ready currently.  Lattie Haw states that the blood is coming from [place]  due to many pt antibodies and that the lab will call when it is ready.

## 2020-07-06 ENCOUNTER — Inpatient Hospital Stay (HOSPITAL_COMMUNITY): Payer: Medicare Other

## 2020-07-06 ENCOUNTER — Ambulatory Visit: Payer: Medicare Other

## 2020-07-06 DIAGNOSIS — R002 Palpitations: Secondary | ICD-10-CM | POA: Diagnosis not present

## 2020-07-06 DIAGNOSIS — I361 Nonrheumatic tricuspid (valve) insufficiency: Secondary | ICD-10-CM

## 2020-07-06 DIAGNOSIS — D649 Anemia, unspecified: Secondary | ICD-10-CM | POA: Diagnosis not present

## 2020-07-06 DIAGNOSIS — K759 Inflammatory liver disease, unspecified: Secondary | ICD-10-CM | POA: Diagnosis not present

## 2020-07-06 DIAGNOSIS — R109 Unspecified abdominal pain: Secondary | ICD-10-CM | POA: Diagnosis not present

## 2020-07-06 DIAGNOSIS — C787 Secondary malignant neoplasm of liver and intrahepatic bile duct: Secondary | ICD-10-CM | POA: Diagnosis not present

## 2020-07-06 LAB — COMPREHENSIVE METABOLIC PANEL WITH GFR
ALT: 69 U/L — ABNORMAL HIGH (ref 0–44)
AST: 59 U/L — ABNORMAL HIGH (ref 15–41)
Albumin: 2.2 g/dL — ABNORMAL LOW (ref 3.5–5.0)
Alkaline Phosphatase: 307 U/L — ABNORMAL HIGH (ref 38–126)
Anion gap: 14 (ref 5–15)
BUN: 18 mg/dL (ref 6–20)
CO2: 18 mmol/L — ABNORMAL LOW (ref 22–32)
Calcium: 8.3 mg/dL — ABNORMAL LOW (ref 8.9–10.3)
Chloride: 104 mmol/L (ref 98–111)
Creatinine, Ser: 1.06 mg/dL — ABNORMAL HIGH (ref 0.44–1.00)
GFR, Estimated: >60 mL/min
Glucose, Bld: 115 mg/dL — ABNORMAL HIGH (ref 70–99)
Potassium: 4.3 mmol/L (ref 3.5–5.1)
Sodium: 136 mmol/L (ref 135–145)
Total Bilirubin: 4.9 mg/dL — ABNORMAL HIGH (ref 0.3–1.2)
Total Protein: 6.3 g/dL — ABNORMAL LOW (ref 6.5–8.1)

## 2020-07-06 LAB — CBC
HCT: 25.5 % — ABNORMAL LOW (ref 36.0–46.0)
Hemoglobin: 8 g/dL — ABNORMAL LOW (ref 12.0–15.0)
MCH: 25.8 pg — ABNORMAL LOW (ref 26.0–34.0)
MCHC: 31.4 g/dL (ref 30.0–36.0)
MCV: 82.3 fL (ref 80.0–100.0)
Platelets: 251 10*3/uL (ref 150–400)
RBC: 3.1 MIL/uL — ABNORMAL LOW (ref 3.87–5.11)
RDW: 25.5 % — ABNORMAL HIGH (ref 11.5–15.5)
WBC: 17.6 10*3/uL — ABNORMAL HIGH (ref 4.0–10.5)
nRBC: 0.4 % — ABNORMAL HIGH (ref 0.0–0.2)

## 2020-07-06 LAB — ECHOCARDIOGRAM COMPLETE
Area-P 1/2: 4.85 cm2
Height: 67 in
S' Lateral: 2.5 cm
Weight: 2021.18 [oz_av]

## 2020-07-06 MED ORDER — OXYCODONE HCL ER 20 MG PO T12A
20.0000 mg | EXTENDED_RELEASE_TABLET | Freq: Two times a day (BID) | ORAL | Status: DC
  Administered 2020-07-07 – 2020-07-14 (×11): 20 mg via ORAL
  Filled 2020-07-06 (×12): qty 1

## 2020-07-06 MED ORDER — METOCLOPRAMIDE HCL 5 MG/ML IJ SOLN
5.0000 mg | Freq: Four times a day (QID) | INTRAMUSCULAR | Status: DC
  Administered 2020-07-06 – 2020-07-08 (×9): 5 mg via INTRAVENOUS
  Filled 2020-07-06 (×6): qty 2

## 2020-07-06 NOTE — Progress Notes (Signed)
  Echocardiogram 2D Echocardiogram has been performed.  Paislie Tessler G Avantae Bither 07/06/2020, 1:49 PM

## 2020-07-06 NOTE — Plan of Care (Signed)

## 2020-07-06 NOTE — Progress Notes (Signed)
Blood has arrived.  Consult put in for IV team.  IV team at pt bedside.

## 2020-07-06 NOTE — Progress Notes (Signed)
OT Cancellation Note  Patient Details Name: CALIANA SPIRES MRN: [id number] DOB: [birth year]   Cancelled Treatment:    Reason Eval/Treat Not Completed: Patient declined, no reason specified;Fatigue/lethargy limiting ability to participate.  Pt sleeping in bed, difficult to arouse. Pt opened eyes briefly with name call, but asked to sleep and closed eyes.  Will continue efforts.  Julien Girt 07/06/2020, 3:38 PM

## 2020-07-06 NOTE — Progress Notes (Signed)
This note also relates to the following rows which could not be included: Rate - Cannot attach notes to extension rows    07/06/20 0706  Vitals  Vital Signs Type (Include Temp, Pulse, RR, and B/P) 15 min. post blood start  Temp 98.5 F (36.9 C)  Temp Source Oral  Pulse Rate (!) 126  ECG Heart Rate (!) 126  Resp 18  BP (!) 131/95  Oxygen Therapy  SpO2 100 %  O2 Device Room Air  Pulse Oximetry Type Intermittent  Transfuse RBC  Volume 30  No acute signs/symptoms of TACO/TRALI currently.  No signs/symptoms of acute reaction noted currently.  Report given to AM nurse.

## 2020-07-06 NOTE — Progress Notes (Signed)
PROGRESS NOTE    Candace Griffin  KNL:[id number] DOB: 1975/10/01 DOA: 06/26/2020 PCP: Richarda Osmond, DO    Brief Narrative:  44 year old female with a past medical history for renal failure status post kidney transplant, newly diagnosed metastatic carcinoma, likely renal in origin. Presented with 4 days of intractable nausea, vomiting,abdominal pain, not able to tolerate liquids or oral medications. Patient was initially placed on broad spectrum antibiotic therapy and received volume resuscitation.  Over the last several days the main issue has been poorly controlled abdominal pain along with nausea vomiting.    Assessment & Plan:  Abdominal pain with nausea and vomiting Suspect that her abdominal pain and vomiting is likely due to the metastatic disease noted in the abdomen.  Patient continues to have a lot of nausea vomiting and abdominal pain.  CT scan done a few days ago did not show any obstruction but did show tumor burden.  Will increase the dose of her narcotics.  We will place her on scheduled Reglan for now.  Acute kidney injury likely prerenal/anion gap metabolic acidosis/lactic acidosis/electrolyte imbalances Improved with hydration.  Seems to be at baseline.  Avoid nephrotoxic agents.  Electrolytes periodically.  Noted to be normal today.  Acute Hepatitis/Elevated liver enzymes/metastatic carcinoma likely renal primary/drug induced hepatitis. US liver with no gallbladder stone or sonographic signs of acute cholecystitis. 1.8x1.5x1.5 cm hypoechoic lesion left lobe of the liver. Acute viral hepatitis negative. Acetaminophen level 10. LFTs have improved.  Bilirubin remains elevated.  She was started back on the cabozantinib.  Severe systemic inflammatory response syndrome(sepsisruled out). Reason for her presentation is still not entirely clear.  Her lipase level was normal.  LFTs have been improving.  Continue with PPI and sucralfate.    Status post kidney  transplant Continue with prednisone,mycophenolate and tacrolimus. Previous rounding MD spoke with Dr. Aquilla Hacker from renal transplant at [facility], plan to continue current immunosuppressive regimen for now.  Tacrolimus levels remains pending. Plan to call the transfer center [phone] with the results.  May need to alter her treatment strategy since it looks like her malignancy may be renal in origin. Medical oncology, Dr. Irene Limbo, has also been in touch with Dr. Sharla Kidney.  Anxiety Continue with as neededlorazepam.  Chronic anemia normocytic likely malignancy related/leukocytosis Blood transfusion in progress for hemoglobin of 7.4.  WBC remains elevated.  Reason for leukocytosis not clear.  No evidence for this infection has been found so far.  Sinus tachycardia Likely due to ongoing pain.  TSH was noted to be normal on 11/3.  T4 was noted to be 1.45 which is slightly on the higher side.  We will recheck her thyroid function tests.  Echocardiogram will be ordered.  DVT prophylaxis: Enoxaparin   Code Status: Full code Family Communication:  No family at the bedside, she has been communicating with her family over the phone.   Disposition: Patient needs to be mobilized.  She was encouraged to work with physical therapy.  She lives at home with her children.  Her mother and brother are able to assist her.  Hopefully she will be able to get back home when improved.  Status is: Inpatient  Remains inpatient appropriate because:Ongoing active pain requiring inpatient pain management, IV treatments appropriate due to intensity of illness or inability to take PO and Inpatient level of care appropriate due to severity of illness   Dispo: The patient is from: Home              Anticipated d/c is  to: Home              Anticipated d/c date is: > 3 days              Patient currently is not medically stable to d/c.        Consultants:   Oncology     Subjective: Continues to have 5-10  abdominal pain.  Had 2 episodes of vomiting yesterday.  Having very poor oral intake.  Objective: Vitals:   07/06/20 0644 07/06/20 0706 07/06/20 0706 07/06/20 0930  BP: (!) 129/94 (!) 131/95  128/88  Pulse: (!) 125 (!) 126 (!) 128 (!) 122  Resp: 16 18  20   Temp: 98.4 F (36.9 C) 98.5 F (36.9 C)  98.4 F (36.9 C)  TempSrc: Oral Oral    SpO2: 99% 100% 100% 100%  Weight:      Height:        Intake/Output Summary (Last 24 hours) at 07/06/2020 1108 Last data filed at 07/06/2020 0706 Gross per 24 hour  Intake 123.85 ml  Output 250 ml  Net -126.15 ml   Filed Weights   06/26/20 1815  Weight: 57.3 kg    Examination:   General appearance: Ill-appearing.  In no distress Resp: Clear to auscultation bilaterally.  Normal effort Cardio: S1-S2 is tachycardic regular.  No S3-S4.  No rubs murmurs or bruit GI: Abdomen is soft.  Tender diffusely without any rebound or guarding.  No masses organomegaly.  Nondistended.   Extremities: Moving all extremities Neurologic: Alert and oriented x3.  No focal neurological deficits.     Data Reviewed: I have personally reviewed following labs and imaging studies  CBC: Recent Labs  Lab 06/30/20 0652 06/30/20 0652 07/01/20 0554 07/03/20 0401 07/04/20 0429 07/05/20 0348 07/06/20 0330  WBC 24.4*   < > 21.9* 15.9* 14.8* 14.1* 17.6*  NEUTROABS 20.0*  --  17.8* 12.7*  --   --   --   HGB 8.8*   < > 8.5* 8.3* 8.2* 7.4* 8.0*  HCT 28.5*   < > 28.1* 25.9* 26.0* 23.5* 25.5*  MCV 84.1   < > 83.9 82.2 83.1 82.2 82.3  PLT 156   < > 155 189 202 203 251   < > = values in this interval not displayed.   Basic Metabolic Panel: Recent Labs  Lab 06/30/20 0652 06/30/20 0652 07/01/20 0554 07/03/20 0401 07/04/20 0429 07/05/20 0348 07/06/20 0330  NA 139   < > 136 135 137 138 136  K 3.6   < > 4.9 3.7 4.6 4.3 4.3  CL 109   < > 106 107 107 107 104  CO2 21*   < > 19* 21* 21* 21* 18*  GLUCOSE 144*   < > 102* 127* 102* 84 115*  BUN 23*   < > 19 14 13 15  18   CREATININE 1.27*   < > 1.04* 0.98 1.05* 0.95 1.06*  CALCIUM 8.4*   < > 8.3* 8.0* 8.3* 8.2* 8.3*  MG 1.8  --   --   --   --   --   --    < > = values in this interval not displayed.   GFR: Estimated Creatinine Clearance: 61.9 mL/min (A) (by C-G formula based on SCr of 1.06 mg/dL (H)). Liver Function Tests: Recent Labs  Lab 07/01/20 0554 07/03/20 0401 07/04/20 0429 07/05/20 0348 07/06/20 0330  AST 158* 65* 57* 57* 59*  ALT 228* 124* 97* 77* 69*  ALKPHOS 389* 298* 287* 280*  307*  BILITOT 4.6* 4.0* 4.1* 4.2* 4.9*  PROT 5.5* 5.6* 5.7* 5.6* 6.3*  ALBUMIN 2.2* 2.0* 2.0* 1.9* 2.2*      Scheduled Meds: . cabozantinib  20 mg Oral Daily  . enoxaparin (LOVENOX) injection  40 mg Subcutaneous QHS  . mouth rinse  15 mL Mouth Rinse BID  . metoCLOPramide (REGLAN) injection  5 mg Intravenous Q6H  . mycophenolate  500 mg Oral BID  . oxyCODONE  20 mg Oral BID  . pantoprazole  40 mg Oral BID  . predniSONE  5 mg Oral Q breakfast  . promethazine  12.5 mg Intravenous TID AC  . scopolamine  1 patch Transdermal Q72H  . sodium chloride flush  10-40 mL Intracatheter Q12H  . sucralfate  1 g Oral TID WC & HS  . Tacrolimus ER  4 mg Oral Daily   Continuous Infusions: . dextrose 5% lactated ringers 990 mL (07/06/20 1028)     LOS: 10 days    Bonnielee Haff, MD  [facility] Pager: On Amion

## 2020-07-06 NOTE — Progress Notes (Signed)
Called Lisa in the blood bank again regarding the blood transfusion.  It is not ready currently.  Lattie Haw states that there lab will call when it is ready.

## 2020-07-07 DIAGNOSIS — C787 Secondary malignant neoplasm of liver and intrahepatic bile duct: Secondary | ICD-10-CM | POA: Diagnosis not present

## 2020-07-07 DIAGNOSIS — R109 Unspecified abdominal pain: Secondary | ICD-10-CM | POA: Diagnosis not present

## 2020-07-07 DIAGNOSIS — K759 Inflammatory liver disease, unspecified: Secondary | ICD-10-CM | POA: Diagnosis not present

## 2020-07-07 DIAGNOSIS — D649 Anemia, unspecified: Secondary | ICD-10-CM | POA: Diagnosis not present

## 2020-07-07 LAB — BPAM RBC
Blood Product Expiration Date: 202112072359
ISSUE DATE / TIME: 202111130645
Unit Type and Rh: 5100

## 2020-07-07 LAB — MAGNESIUM: Magnesium: 1.2 mg/dL — ABNORMAL LOW (ref 1.7–2.4)

## 2020-07-07 LAB — COMPREHENSIVE METABOLIC PANEL
ALT: 53 U/L — ABNORMAL HIGH (ref 0–44)
AST: 52 U/L — ABNORMAL HIGH (ref 15–41)
Albumin: 1.9 g/dL — ABNORMAL LOW (ref 3.5–5.0)
Alkaline Phosphatase: 272 U/L — ABNORMAL HIGH (ref 38–126)
Anion gap: 9 (ref 5–15)
BUN: 17 mg/dL (ref 6–20)
CO2: 22 mmol/L (ref 22–32)
Calcium: 8.1 mg/dL — ABNORMAL LOW (ref 8.9–10.3)
Chloride: 106 mmol/L (ref 98–111)
Creatinine, Ser: 1.23 mg/dL — ABNORMAL HIGH (ref 0.44–1.00)
GFR, Estimated: 56 mL/min — ABNORMAL LOW (ref >60)
Glucose, Bld: 117 mg/dL — ABNORMAL HIGH (ref 70–99)
Potassium: 4.5 mmol/L (ref 3.5–5.1)
Sodium: 137 mmol/L (ref 135–145)
Total Bilirubin: 4.8 mg/dL — ABNORMAL HIGH (ref 0.3–1.2)
Total Protein: 5.5 g/dL — ABNORMAL LOW (ref 6.5–8.1)

## 2020-07-07 LAB — TYPE AND SCREEN
ABO/RH(D): O POS
Antibody Screen: POSITIVE
DAT, IgG: NEGATIVE
Donor AG Type: NEGATIVE
Unit division: 0

## 2020-07-07 LAB — TSH: TSH: 3.089 u[IU]/mL (ref 0.350–4.500)

## 2020-07-07 LAB — CBC
HCT: 27 % — ABNORMAL LOW (ref 36.0–46.0)
Hemoglobin: 8.7 g/dL — ABNORMAL LOW (ref 12.0–15.0)
MCH: 26.9 pg (ref 26.0–34.0)
MCHC: 32.2 g/dL (ref 30.0–36.0)
MCV: 83.6 fL (ref 80.0–100.0)
Platelets: 198 10*3/uL (ref 150–400)
RBC: 3.23 MIL/uL — ABNORMAL LOW (ref 3.87–5.11)
RDW: 24.3 % — ABNORMAL HIGH (ref 11.5–15.5)
WBC: 14 10*3/uL — ABNORMAL HIGH (ref 4.0–10.5)
nRBC: 0.3 % — ABNORMAL HIGH (ref 0.0–0.2)

## 2020-07-07 LAB — T4, FREE: Free T4: 1.14 ng/dL — ABNORMAL HIGH (ref 0.61–1.12)

## 2020-07-07 MED ORDER — METOPROLOL TARTRATE 25 MG PO TABS
12.5000 mg | ORAL_TABLET | Freq: Two times a day (BID) | ORAL | Status: DC
  Administered 2020-07-07 – 2020-07-10 (×7): 12.5 mg via ORAL
  Filled 2020-07-07 (×7): qty 1

## 2020-07-07 MED ORDER — SODIUM CHLORIDE 0.45 % IV BOLUS
500.0000 mL | Freq: Once | INTRAVENOUS | Status: AC
  Administered 2020-07-07: 500 mL via INTRAVENOUS

## 2020-07-07 NOTE — Progress Notes (Signed)
PROGRESS NOTE    Candace Griffin  OZH:[id number] DOB: Oct 08, 1975 DOA: 06/26/2020 PCP: Richarda Osmond, DO    Brief Narrative:  44 year old female with a past medical history for renal failure status post kidney transplant, newly diagnosed metastatic carcinoma, likely renal in origin. Presented with 4 days of intractable nausea, vomiting,abdominal pain, not able to tolerate liquids or oral medications. Patient was initially placed on broad spectrum antibiotic therapy and received volume resuscitation.  Over the last several days the main issue has been poorly controlled abdominal pain along with nausea vomiting.    Assessment & Plan:  Abdominal pain with nausea and vomiting Suspect that her abdominal pain and vomiting is likely due to the metastatic disease noted in the abdomen.  Patient continues to have a lot of nausea vomiting and abdominal pain.  CT scan done a few days ago did not show any obstruction but did show tumor burden.   Dose of her OxyContin was increased yesterday.  She was placed on scheduled Reglan yesterday.  Continues to have nausea.  She had one episode of vomiting yesterday.  If there is no improvement in the next 24 hours we will increase the dose of Reglan tomorrow.  May also need to increase the dose of her OxyContin.    Acute kidney injury likely prerenal/anion gap metabolic acidosis/lactic acidosis/electrolyte imbalances Baseline creatinine is around 1.0.  Presented with a creatinine of 1.42 with a BUN of 38.  She was given IV fluids with improvement.  Over the last 24 hours her creatinine has gone up.  Likely due to poor oral intake.  Will increase the rate of her IV fluids and give her a fluid bolus.  Recheck labs tomorrow.  Monitor urine output.   Elevated liver enzymes/metastatic carcinoma likely renal primary/drug induced hepatitis. US liver with no gallbladder stone or sonographic signs of acute cholecystitis. 1.8x1.5x1.5 cm hypoechoic lesion left  lobe of the liver. Acute viral hepatitis negative. Acetaminophen level 10. LFTs have improved.  Bilirubin remains elevated.   Concern for metastatic disease on imaging studies.  She was started back on the cabozantinib.  Severe systemic inflammatory response syndrome(sepsisruled out). Reason for her presentation is still not entirely clear.  Her lipase level was normal.  LFTs have been improving.  Continue with PPI and sucralfate.    Status post kidney transplant Continue with prednisone,mycophenolate and tacrolimus. Previous rounding MD spoke with Dr. Aquilla Hacker from renal transplant at [facility], plan to continue current immunosuppressive regimen for now.  Tacrolimus levels remains pending.  It was sent on 11/10. Plan to call the transfer center [phone] with the results.  May need to alter her treatment strategy since it looks like her malignancy may be renal in origin. Medical oncology, Dr. Irene Limbo, has also been in touch with Dr. Sharla Kidney.  Anxiety Continue with as neededlorazepam.  Chronic anemia normocytic likely malignancy related/leukocytosis Transfuse for a hemoglobin of 7.4.  Noted to be 8.7 this morning.  No evidence of overt bleeding.  Leukocytosis is stable.    Sinus tachycardia Heart rate noted to be higher over the last 24 hours.  Possibly due to hypovolemia.  Fluid boluses as mentioned above.  May need to consider low-dose beta-blocker.  EKG will be ordered.  TSH was noted to be normal on 11/3.  T4 was noted to be 1.45 which is slightly on the higher side.  Thyroid function tests were repeated.  Today TSH noted to be 3.08 and free T4 noted to be 1.14.  Echocardiogram was  also done which does not show any significant abnormalities.    DVT prophylaxis: Enoxaparin   Code Status: Full code Family Communication:  No family at the bedside, she has been communicating with her family over the phone.   Disposition: Continue to mobilize as tolerated.  She lives at home with her  children.  Her mother and brother are able to assist her.  Hopefully she will be able to get back home when improved.  Status is: Inpatient  Remains inpatient appropriate because:Ongoing active pain requiring inpatient pain management, IV treatments appropriate due to intensity of illness or inability to take PO and Inpatient level of care appropriate due to severity of illness   Dispo: The patient is from: Home              Anticipated d/c is to: Home              Anticipated d/c date is: > 3 days              Patient currently is not medically stable to d/c.        Consultants:   Oncology     Subjective: Continues to have 5-6 out of 10 pain in the abdomen.  One episode of vomiting yesterday.  Continues to have poor oral intake though she did manage to eat some fruit yesterday.  Objective: Vitals:   07/06/20 0930 07/06/20 1241 07/06/20 2112 07/07/20 0532  BP: 128/88 (!) 131/106 127/90 (!) 127/92  Pulse: (!) 122 (!) 130 (!) 133 (!) 134  Resp: 20 20 18 18   Temp: 98.4 F (36.9 C) 98.1 F (36.7 C) 99.4 F (37.4 C) 98.9 F (37.2 C)  TempSrc:   Oral   SpO2: 100% 100% 96% 100%  Weight:      Height:        Intake/Output Summary (Last 24 hours) at 07/07/2020 1126 Last data filed at 07/07/2020 0800 Gross per 24 hour  Intake 10 ml  Output 250 ml  Net -240 ml   Filed Weights   06/26/20 1815  Weight: 57.3 kg    Examination:   General appearance: Awake alert.  Ill-appearing.  In no distress. Resp: Clear to auscultation bilaterally.  Normal effort Cardio: S1-S2 is tachycardic regular.  No S3-S4.  No rubs or bruit. GI: Abdomen is soft.  Tender diffusely without any rebound rigidity or guarding.  No masses organomegaly.  Extremities: Able to move all of her extremities Neurologic: Alert and oriented x3.  No focal neurological deficits.      Data Reviewed: I have personally reviewed following labs and imaging studies  CBC: Recent Labs  Lab 07/01/20 0554  07/01/20 0554 07/03/20 0401 07/04/20 0429 07/05/20 0348 07/06/20 0330 07/07/20 0415  WBC 21.9*   < > 15.9* 14.8* 14.1* 17.6* 14.0*  NEUTROABS 17.8*  --  12.7*  --   --   --   --   HGB 8.5*   < > 8.3* 8.2* 7.4* 8.0* 8.7*  HCT 28.1*   < > 25.9* 26.0* 23.5* 25.5* 27.0*  MCV 83.9   < > 82.2 83.1 82.2 82.3 83.6  PLT 155   < > 189 202 203 251 198   < > = values in this interval not displayed.   Basic Metabolic Panel: Recent Labs  Lab 07/03/20 0401 07/04/20 0429 07/05/20 0348 07/06/20 0330 07/07/20 0415  NA 135 137 138 136 137  K 3.7 4.6 4.3 4.3 4.5  CL 107 107 107 104 106  CO2 21*  21* 21* 18* 22  GLUCOSE 127* 102* 84 115* 117*  BUN 14 13 15 18 17   CREATININE 0.98 1.05* 0.95 1.06* 1.23*  CALCIUM 8.0* 8.3* 8.2* 8.3* 8.1*  MG  --   --   --   --  1.2*   GFR: Estimated Creatinine Clearance: 53.3 mL/min (A) (by C-G formula based on SCr of 1.23 mg/dL (H)). Liver Function Tests: Recent Labs  Lab 07/03/20 0401 07/04/20 0429 07/05/20 0348 07/06/20 0330 07/07/20 0415  AST 65* 57* 57* 59* 52*  ALT 124* 97* 77* 69* 53*  ALKPHOS 298* 287* 280* 307* 272*  BILITOT 4.0* 4.1* 4.2* 4.9* 4.8*  PROT 5.6* 5.7* 5.6* 6.3* 5.5*  ALBUMIN 2.0* 2.0* 1.9* 2.2* 1.9*      Scheduled Meds: . cabozantinib  20 mg Oral Daily  . enoxaparin (LOVENOX) injection  40 mg Subcutaneous QHS  . mouth rinse  15 mL Mouth Rinse BID  . metoCLOPramide (REGLAN) injection  5 mg Intravenous Q6H  . mycophenolate  500 mg Oral BID  . oxyCODONE  20 mg Oral BID  . pantoprazole  40 mg Oral BID  . predniSONE  5 mg Oral Q breakfast  . promethazine  12.5 mg Intravenous TID AC  . scopolamine  1 patch Transdermal Q72H  . sodium chloride flush  10-40 mL Intracatheter Q12H  . sucralfate  1 g Oral TID WC & HS  . Tacrolimus ER  4 mg Oral Daily   Continuous Infusions: . dextrose 5% lactated ringers 100 mL/hr at 07/07/20 0816     LOS: 11 days    Bonnielee Haff, MD  [facility] Pager: On Amion

## 2020-07-07 NOTE — Progress Notes (Signed)
OT Cancellation Note  Patient Details Name: Candace Griffin MRN: [id number] DOB: [birth year]   Cancelled Treatment:    Reason Eval/Treat Not Completed: Medical issues which prohibited therapy.  Spoke with Nursing prior to attempting pt. Nursing asked to hold due to continued pain and tachycardia. Will continue to monitor.   Julien Girt 07/07/2020, 1:27 PM

## 2020-07-08 ENCOUNTER — Inpatient Hospital Stay (HOSPITAL_COMMUNITY): Payer: Medicare Other

## 2020-07-08 DIAGNOSIS — C787 Secondary malignant neoplasm of liver and intrahepatic bile duct: Secondary | ICD-10-CM | POA: Diagnosis not present

## 2020-07-08 DIAGNOSIS — R112 Nausea with vomiting, unspecified: Secondary | ICD-10-CM | POA: Diagnosis not present

## 2020-07-08 DIAGNOSIS — R109 Unspecified abdominal pain: Secondary | ICD-10-CM | POA: Diagnosis not present

## 2020-07-08 LAB — COMPREHENSIVE METABOLIC PANEL
ALT: 44 U/L (ref 0–44)
AST: 46 U/L — ABNORMAL HIGH (ref 15–41)
Albumin: 1.7 g/dL — ABNORMAL LOW (ref 3.5–5.0)
Alkaline Phosphatase: 230 U/L — ABNORMAL HIGH (ref 38–126)
Anion gap: 9 (ref 5–15)
BUN: 15 mg/dL (ref 6–20)
CO2: 22 mmol/L (ref 22–32)
Calcium: 7.8 mg/dL — ABNORMAL LOW (ref 8.9–10.3)
Chloride: 106 mmol/L (ref 98–111)
Creatinine, Ser: 1.07 mg/dL — ABNORMAL HIGH (ref 0.44–1.00)
GFR, Estimated: >60 mL/min (ref >60)
Glucose, Bld: 130 mg/dL — ABNORMAL HIGH (ref 70–99)
Potassium: 4.1 mmol/L (ref 3.5–5.1)
Sodium: 137 mmol/L (ref 135–145)
Total Bilirubin: 4.3 mg/dL — ABNORMAL HIGH (ref 0.3–1.2)
Total Protein: 5.1 g/dL — ABNORMAL LOW (ref 6.5–8.1)

## 2020-07-08 LAB — CBC
HCT: 25.3 % — ABNORMAL LOW (ref 36.0–46.0)
Hemoglobin: 8.1 g/dL — ABNORMAL LOW (ref 12.0–15.0)
MCH: 27 pg (ref 26.0–34.0)
MCHC: 32 g/dL (ref 30.0–36.0)
MCV: 84.3 fL (ref 80.0–100.0)
Platelets: 154 10*3/uL (ref 150–400)
RBC: 3 MIL/uL — ABNORMAL LOW (ref 3.87–5.11)
RDW: 24.7 % — ABNORMAL HIGH (ref 11.5–15.5)
WBC: 16 10*3/uL — ABNORMAL HIGH (ref 4.0–10.5)
nRBC: 0.2 % (ref 0.0–0.2)

## 2020-07-08 LAB — TACROLIMUS LEVEL: Tacrolimus (FK506) - LabCorp: 10.4 ng/mL (ref 2.0–20.0)

## 2020-07-08 IMAGING — DX DG ABD PORTABLE 2V
3 series · 3 of 3 positions shown · non-contrast
Comparison: [DATE]

CLINICAL DATA: Nausea and vomiting.

EXAM:
PORTABLE ABDOMEN - 2 VIEW

[abdomen erect]
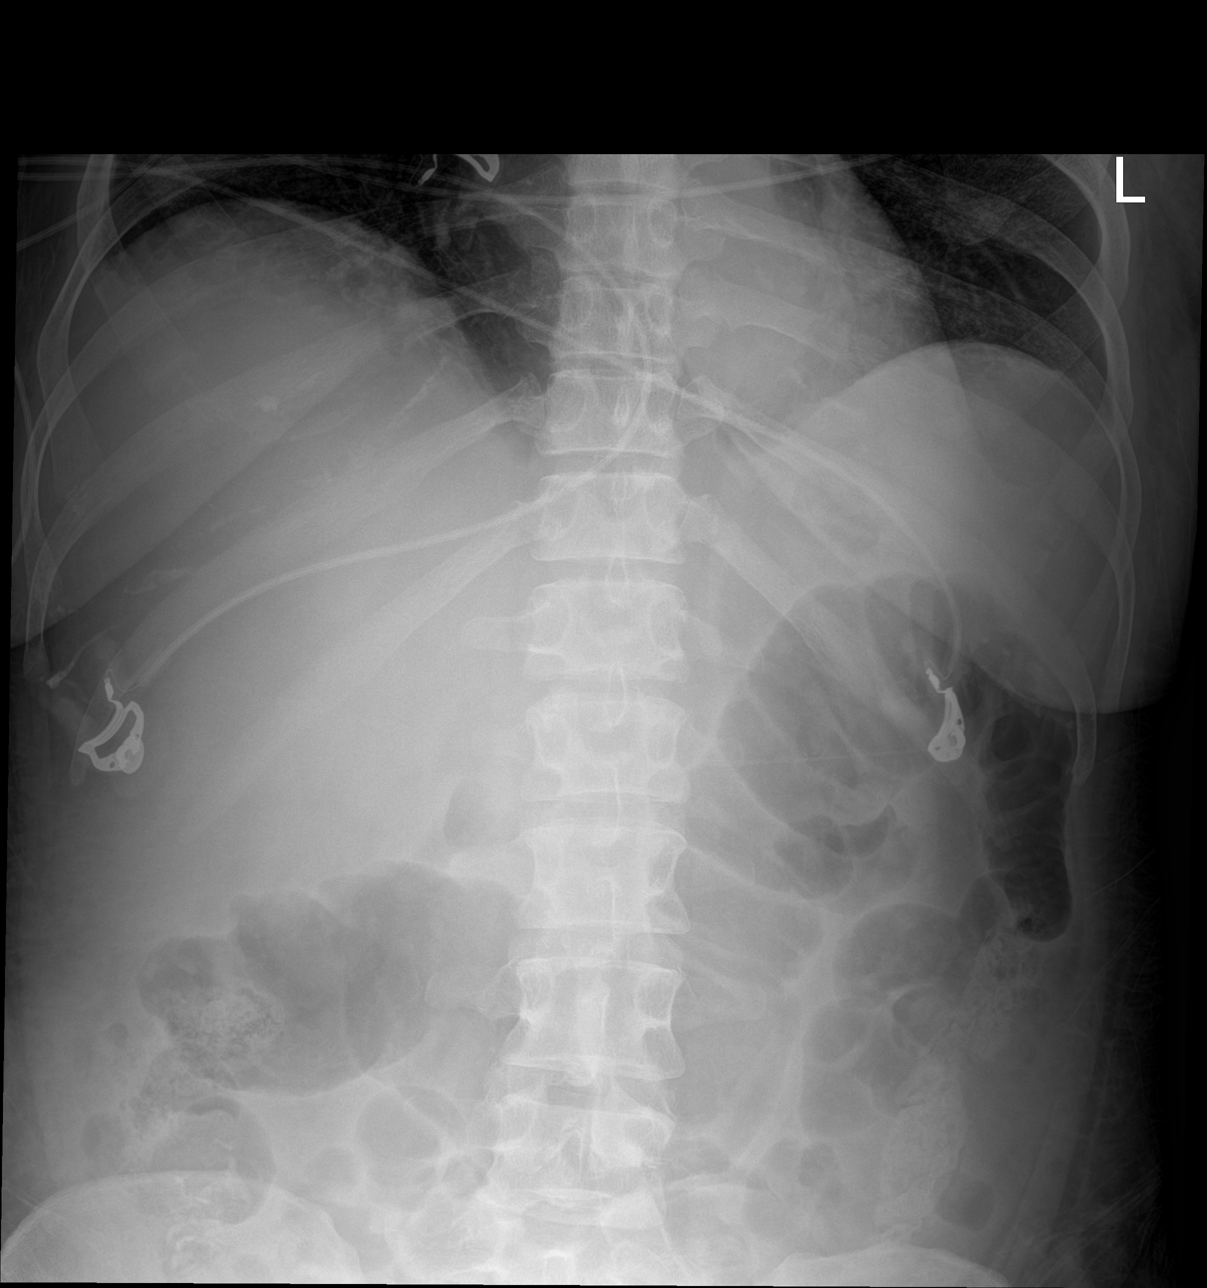

[abdomen supine (1 of 2)]
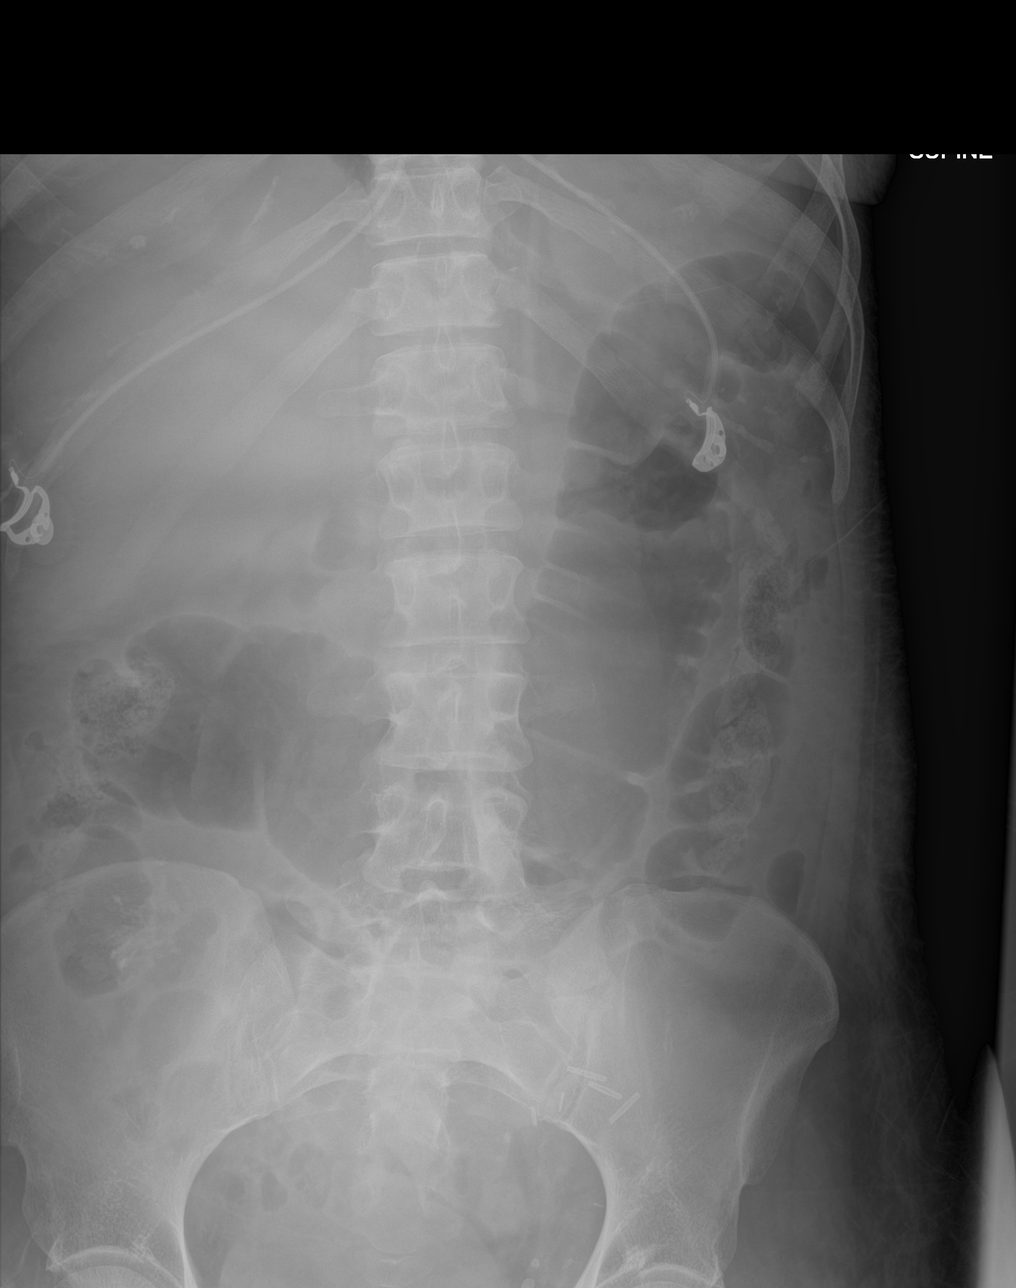

[abdomen supine (2 of 2)]
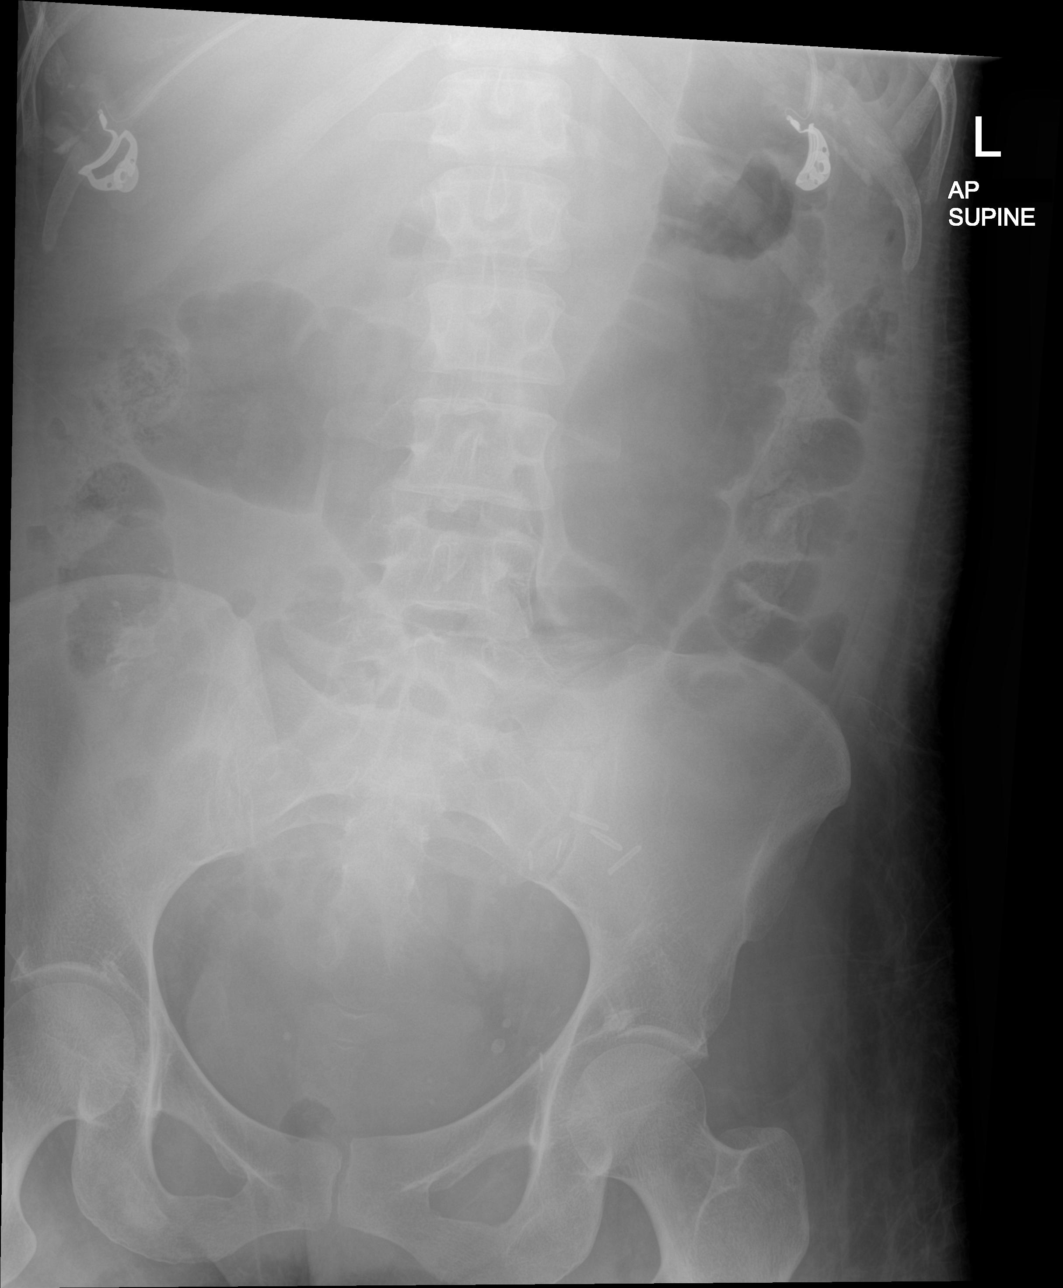

[3 of 3 positions shown; findings below may reference images not displayed]

FINDINGS: Gas distended transverse colon but no overt obstructive pattern. No
abnormal stool retention or evidence of small bowel obstruction.
Clear lung bases. Calcification in the right iliac fossa from failed
transplant based on CT.
IMPRESSION: Normal bowel gas pattern.  No abnormal stool retention.

## 2020-07-08 MED ORDER — METOCLOPRAMIDE HCL 5 MG/ML IJ SOLN
10.0000 mg | Freq: Four times a day (QID) | INTRAMUSCULAR | Status: DC
  Administered 2020-07-08 – 2020-07-15 (×27): 10 mg via INTRAVENOUS
  Filled 2020-07-08 (×29): qty 2

## 2020-07-08 MED ORDER — DRONABINOL 2.5 MG PO CAPS
2.5000 mg | ORAL_CAPSULE | Freq: Two times a day (BID) | ORAL | Status: DC
  Administered 2020-07-08 – 2020-07-09 (×2): 2.5 mg via ORAL
  Filled 2020-07-08 (×2): qty 1

## 2020-07-08 MED ORDER — MAGNESIUM SULFATE 2 GM/50ML IV SOLN
2.0000 g | Freq: Once | INTRAVENOUS | Status: AC
  Administered 2020-07-08: 2 g via INTRAVENOUS
  Filled 2020-07-08: qty 50

## 2020-07-08 NOTE — Care Management Important Message (Signed)
Important Message  Patient Details IM Letter given to the Patient. Name: Candace Griffin MRN: [id number] Date of Birth: 04-25-1976   Medicare Important Message Given:  Yes     [name] 07/08/2020, 11:52 AM

## 2020-07-08 NOTE — Progress Notes (Addendum)
PROGRESS NOTE    Candace Griffin  MCN:[id number] DOB: Dec 25, 1975 DOA: 06/26/2020 PCP: Richarda Osmond, DO    Brief Narrative:  44 year old female with a past medical history for renal failure status post kidney transplant, newly diagnosed metastatic carcinoma, likely renal in origin. Presented with 4 days of intractable nausea, vomiting,abdominal pain, not able to tolerate liquids or oral medications. Patient was initially placed on broad spectrum antibiotic therapy and received volume resuscitation.  Over the last several days the main issue has been poorly controlled abdominal pain along with nausea vomiting.    Assessment & Plan:  Abdominal pain with nausea and vomiting Suspect that her abdominal pain and vomiting is likely due to the metastatic disease noted in the abdomen.  Patient states that her abdominal pain has improved.  However she continues to have significant nausea.  She had multiple episodes of vomiting this morning.  CT scan of the abdomen done a few days ago did not show any obstruction but did show tumor burden.  Dose of her OxyContin was increased 2 days ago.  We will continue with current dose for now She was started on scheduled Reglan.  Will get abdominal x-ray today to make sure there is no new abnormality.  We will then consider increasing the dose of the Reglan.  Patient is also noted to be on scopolamine patch.  Acute kidney injury likely prerenal/anion gap metabolic acidosis/lactic acidosis/electrolyte imbalances Baseline creatinine is around 1.0.  Presented with a creatinine of 1.42 with a BUN of 38.  She was given IV fluids with improvement.   Due to the nausea vomiting patient again became dehydrated.  Creatinine went up to 1.23 yesterday.  She was given IV fluids.  Maintenance fluid rate was increased.  Creatinine improved to 1.07 today.  Monitor urine output.  Elevated liver enzymes/metastatic carcinoma likely renal primary/drug induced  hepatitis. US liver with no gallbladder stone or sonographic signs of acute cholecystitis. 1.8x1.5x1.5 cm hypoechoic lesion left lobe of the liver. Acute viral hepatitis negative. Acetaminophen level 10. LFTs have improved.  Bilirubin is stable. Concern for metastatic disease on imaging studies.  She was started back on the cabozantinib.  Severe systemic inflammatory response syndrome(sepsisruled out). Reason for her presentation is still not entirely clear.  Her lipase level was normal.  LFTs have been improving.  Continue with PPI and sucralfate.    Status post kidney transplant Continue with prednisone,mycophenolate and tacrolimus. Previous rounding MD spoke with Dr. Aquilla Hacker from renal transplant at [facility], plan to continue current immunosuppressive regimen for now.  Tacrolimus levels remains pending.  It was sent on 11/10.  Called lab today.  The sample was sent to [facility] and they are having some instrument issues.  Results will be available only on Wednesday, 11/17. Plan to call the transfer center [phone] with the results.  May need to alter her treatment strategy since it looks like her malignancy may be renal in origin. Medical oncology, Dr. Irene Limbo, has also been in touch with Dr. Sharla Kidney.  Anxiety Continue with as neededlorazepam.  Chronic anemia normocytic likely malignancy related/leukocytosis She was transfused on 11/13 for hemoglobin of 7.4.  Improved.  Stable this morning.  Continue to monitor.  No evidence for overt bleeding.    Sinus tachycardia Elevated heart rate most likely due to hypovolemia pain issues.  EKG shows sinus tachycardia.  TSH was noted to be normal on 11/3.  T4 was noted to be 1.45 which is slightly on the higher side.  Thyroid function  tests were repeated on 11/14 and TSH noted to be 3.08 and free T4 noted to be 1.14.  Echocardiogram was also done which does not show any significant abnormalities.   Continue low dose metoprolol.  DVT  prophylaxis: Enoxaparin   Code Status: Full code Family Communication:  No family at the bedside, she has been communicating with her family over the phone.   Disposition: Continue to mobilize as tolerated.  She lives at home with her children.  Her mother and brother are able to assist her.  Hopefully she will be able to get back home when improved.  Status is: Inpatient  Remains inpatient appropriate because:Ongoing active pain requiring inpatient pain management, IV treatments appropriate due to intensity of illness or inability to take PO and Inpatient level of care appropriate due to severity of illness   Dispo: The patient is from: Home              Anticipated d/c is to: Home              Anticipated d/c date is: > 3 days              Patient currently is not medically stable to d/c.        Consultants:   Oncology     Subjective: Continues to have significant nausea and vomiting.  Abdominal pain seems to be better.  No other complaints offered including chest pain or shortness of breath.  Has been having bowel movements.  Objective: Vitals:   07/07/20 1700 07/07/20 2057 07/08/20 0548 07/08/20 1010  BP:  99/87 (!) 128/94 (!) 131/93  Pulse: (!) 114 (!) 111 (!) 118 (!) 116  Resp:  18 18 20   Temp:  98 F (36.7 C) 98.6 F (37 C) 98.5 F (36.9 C)  TempSrc:   Oral   SpO2:  99% 98% 99%  Weight:      Height:        Intake/Output Summary (Last 24 hours) at 07/08/2020 1052 Last data filed at 07/08/2020 0420 Gross per 24 hour  Intake 2983 ml  Output 50 ml  Net 2933 ml   Filed Weights   06/26/20 1815  Weight: 57.3 kg    Examination:   General appearance: Awake alert.  Ill-appearing. Resp: Clear to auscultation bilaterally.  Normal effort Cardio: S1-S2 is normal regular.  No S3-S4.  No rubs murmurs or bruit GI: Abdomen is soft.  Less tender today compared to yesterday.  Bowel sounds present normal.  No masses organomegaly.  Extremities: No edema.     Neurologic: Alert and oriented x3.  No focal neurological deficits.         Data Reviewed: I have personally reviewed following labs and imaging studies  CBC: Recent Labs  Lab 07/03/20 0401 07/03/20 0401 07/04/20 0429 07/05/20 0348 07/06/20 0330 07/07/20 0415 07/08/20 0419  WBC 15.9*   < > 14.8* 14.1* 17.6* 14.0* 16.0*  NEUTROABS 12.7*  --   --   --   --   --   --   HGB 8.3*   < > 8.2* 7.4* 8.0* 8.7* 8.1*  HCT 25.9*   < > 26.0* 23.5* 25.5* 27.0* 25.3*  MCV 82.2   < > 83.1 82.2 82.3 83.6 84.3  PLT 189   < > 202 203 251 198 154   < > = values in this interval not displayed.   Basic Metabolic Panel: Recent Labs  Lab 07/04/20 0429 07/05/20 0348 07/06/20 0330 07/07/20 0415 07/08/20 0419  NA 137 138 136 137 137  K 4.6 4.3 4.3 4.5 4.1  CL 107 107 104 106 106  CO2 21* 21* 18* 22 22  GLUCOSE 102* 84 115* 117* 130*  BUN 13 15 18 17 15   CREATININE 1.05* 0.95 1.06* 1.23* 1.07*  CALCIUM 8.3* 8.2* 8.3* 8.1* 7.8*  MG  --   --   --  1.2*  --    GFR: Estimated Creatinine Clearance: 61.3 mL/min (A) (by C-G formula based on SCr of 1.07 mg/dL (H)). Liver Function Tests: Recent Labs  Lab 07/04/20 0429 07/05/20 0348 07/06/20 0330 07/07/20 0415 07/08/20 0419  AST 57* 57* 59* 52* 46*  ALT 97* 77* 69* 53* 44  ALKPHOS 287* 280* 307* 272* 230*  BILITOT 4.1* 4.2* 4.9* 4.8* 4.3*  PROT 5.7* 5.6* 6.3* 5.5* 5.1*  ALBUMIN 2.0* 1.9* 2.2* 1.9* 1.7*      Scheduled Meds: . cabozantinib  20 mg Oral Daily  . enoxaparin (LOVENOX) injection  40 mg Subcutaneous QHS  . mouth rinse  15 mL Mouth Rinse BID  . metoCLOPramide (REGLAN) injection  5 mg Intravenous Q6H  . metoprolol tartrate  12.5 mg Oral BID  . mycophenolate  500 mg Oral BID  . oxyCODONE  20 mg Oral BID  . pantoprazole  40 mg Oral BID  . predniSONE  5 mg Oral Q breakfast  . promethazine  12.5 mg Intravenous TID AC  . scopolamine  1 patch Transdermal Q72H  . sodium chloride flush  10-40 mL Intracatheter Q12H  .  sucralfate  1 g Oral TID WC & HS  . Tacrolimus ER  4 mg Oral Daily   Continuous Infusions: . dextrose 5% lactated ringers 100 mL/hr at 07/08/20 0524     LOS: 12 days    Bonnielee Haff, MD  [facility] Pager: On Amion

## 2020-07-08 NOTE — Progress Notes (Addendum)
Physical Therapy Treatment Patient Details Name: Candace Griffin MRN: [id number] DOB: January 25, 1976 Today's Date: 07/08/2020    History of Present Illness 44 year old female with a past medical history for renal failure status post kidney transplant, newly diagnosed metastatic carcinoma to liver, unknown primary.  Presents with intractable nausea, vomiting, abdominal pain, not able to tolerate liquids or oral medications. Renal cell carcinoma. Dx of SIRS; likely pancreatitis, AKI; acute hepatitis; anxiety.    PT Comments    Pt requires significant education on benefit of therapy and encouragement to participate. Pt initially with 6/10 pain in supine, but reports 2/10 pain at EOS. Pt comes to sitting EOB with increased work pushing through log rolling to upright trunk and tolerates sitting EOB without increased pain. Pt transfers to [facility] with min A x2 to steady and assist with pivot over to [facility]. Pt declines RW use despite encouragement, able to rise from [facility] and take steps up to [facility] with 2 person assist before requesting to return to sitting. Pt HR 148bpm max noted with mobility. Pt's RW in room and therapist fit to pt's height. Pt will benefit from continued physical therapy in hospital and recommendations below to increase strength, balance, endurance for safe ADLs and gait.   Follow Up Recommendations  Home health PT;Supervision/Assistance - 24 hour     Equipment Recommendations  Rolling walker with 5" wheels    Recommendations for Other Services       Precautions / Restrictions Precautions Precautions: Fall Precaution Comments: monitor HR Restrictions Weight Bearing Restrictions: No    Mobility  Bed Mobility Overal bed mobility: Needs Assistance Bed Mobility: Supine to Sit;Sit to Supine  Supine to sit: Min guard Sit to supine: Min assist   General bed mobility comments: increased time to come to sitting EOB through log roll position, min A to lift BLE back into bed with return  to supine  Transfers Overall transfer level: Needs assistance Equipment used: 2 person hand held assist Transfers: Sit to/from [employer] Transfers Sit to Stand: Min assist;+2 physical assistance Stand pivot transfers: Min assist;+2 physical assistance    General transfer comment: min A with each therapist at pt's sides to power up from seated EOB, min A to steady while pivoting over to [facility]  Ambulation/Gait Ambulation/Gait assistance: Min assist;+2 safety/equipment  Assistive device: 2 person hand held assist    General Gait Details: limited to a few short, shuffling sidesteps up to [facility] with min A to steady, limited due to "heart racing" sensation   Stairs             Wheelchair Mobility    Modified Rankin (Stroke Patients Only)       Balance Overall balance assessment: Needs assistance Sitting-balance support: Feet supported;No upper extremity supported Sitting balance-Leahy Scale: Fair Sitting balance - Comments: seated EOB   Standing balance support: During functional activity;Bilateral upper extremity supported Standing balance-Leahy Scale: Poor Standing balance comment: reliant on UE support       Cognition Arousal/Alertness: Awake/alert Behavior During Therapy: WFL for tasks assessed/performed Overall Cognitive Status: Within Functional Limits for tasks assessed  General Comments: Pt requires much encouragement to participate with therapy      Exercises      General Comments General comments (skin integrity, edema, etc.): Pt's HR 121bpm supine in bed, HR 148bpm max noted with mobility      Pertinent Vitals/Pain Pain Assessment: 0-10 Pain Score: 6  ((6/10 initially, 2/10 at EOS)) Pain Location: back and bil thighs Pain Descriptors / Indicators:  Aching;Other (Comment) ("stretching") Pain Intervention(s): Limited activity within patient's tolerance;Monitored during session;Premedicated before session    Home Living                       Prior Function            PT Goals (current goals can now be found in the care plan section) Acute Rehab PT Goals Patient Stated Goal: to get better and not have so much pain PT Goal Formulation: With patient Time For Goal Achievement: 07/18/20 Potential to Achieve Goals: Good Progress towards PT goals: Progressing toward goals    Frequency    Min 3X/week      PT Plan Discharge plan needs to be updated    Co-evaluation              AM-PAC PT "6 Clicks" Mobility   Outcome Measure  Help needed turning from your back to your side while in a flat bed without using bedrails?: None Help needed moving from lying on your back to sitting on the side of a flat bed without using bedrails?: A Little Help needed moving to and from a bed to a chair (including a wheelchair)?: A Lot Help needed standing up from a chair using your arms (e.g., wheelchair or bedside chair)?: A Little Help needed to walk in hospital room?: A Lot Help needed climbing 3-5 steps with a railing? : A Lot 6 Click Score: 16    End of Session   Activity Tolerance: Patient limited by pain Patient left: in bed;with call bell/phone within reach;with bed alarm set Nurse Communication: Mobility status PT Visit Diagnosis: Difficulty in walking, not elsewhere classified (R26.2)     Time: 1610-9604 PT Time Calculation (min) (ACUTE ONLY): 33 min  Charges:  $Therapeutic Activity: 8-22 mins                      Tori Carrah Eppolito PT, DPT 07/08/20, 3:26 PM

## 2020-07-08 NOTE — Progress Notes (Signed)
Occupational Therapy Treatment Patient Details Name: Candace Griffin MRN: [id number] DOB: Jul 04, 1976 Today's Date: 07/08/2020    History of present illness 44 year old female with a past medical history for renal failure status post kidney transplant, newly diagnosed metastatic carcinoma to liver, unknown primary.  Presents with intractable nausea, vomiting, abdominal pain, not able to tolerate liquids or oral medications. Renal cell carcinoma. Dx of SIRS; likely pancreatitis, AKI; acute hepatitis; anxiety.   OT comments  This 44 yo female admitted with above with MD still working on her pain meds and anti-nausea meds for the best fit worked with OT/PT today during a co-session with max encouragement to work with [place]. She is very weak but can manage from bed to [facility] with minimal A and can get up to EOB with minguard A with increased time. She will continue to benefit from acute OT with now recommendation being HHOT due to pt's increased weakness since initial eval.   Follow Up Recommendations  Home health OT;Supervision/Assistance - 24 hour    Equipment Recommendations  Tub/shower seat       Precautions / Restrictions Precautions Precautions: Fall Precaution Comments: monitor HR (ranged from 121-148) Restrictions Weight Bearing Restrictions: No       Mobility Bed Mobility Overal bed mobility: Needs Assistance Bed Mobility: Supine to Sit;Sit to Supine     Supine to sit: Min guard Sit to supine: Min assist   General bed mobility comments: increased time to come to sitting EOB through log roll position, min A to lift BLE back into bed with return to supine  Transfers Overall transfer level: Needs assistance Equipment used: 2 person hand held assist Transfers: Sit to/from [employer] Sit to Stand: Min assist;+2 physical assistance Stand pivot transfers: Min assist;+2 physical assistance       General transfer comment: min A with each therapist at pt's sides  to power up from seated EOB, min A to steady while pivoting over to [facility]    Balance Overall balance assessment: Needs assistance Sitting-balance support: Feet supported;No upper extremity supported Sitting balance-Leahy Scale: Fair Sitting balance - Comments: seated EOB   Standing balance support: During functional activity;Bilateral upper extremity supported Standing balance-Leahy Scale: Poor Standing balance comment: reliant on UE support                           ADL either performed or assessed with clinical judgement   ADL Overall ADL's : Needs assistance/impaired                     Lower Body Dressing: Set up;Supervision/safety Lower Body Dressing Details (indicate cue type and reason): min A +2 sit<>stand Toilet Transfer: Minimal assistance;+2 for physical assistance;Stand-pivot;BSC   Toileting- Clothing Manipulation and Hygiene: Minimal assistance;+2 for physical assistance;Sit to/from stand Toileting - Clothing Manipulation Details (indicate cue type and reason): Mod A sit<>stand             Vision Baseline Vision/History: Wears glasses Wears Glasses: At all times Patient Visual Report: No change from baseline            Cognition Arousal/Alertness: Awake/alert Behavior During Therapy: WFL for tasks assessed/performed Overall Cognitive Status: Within Functional Limits for tasks assessed                                 General Comments: Pt requires much encouragement to participate with therapy  General Comments Pt's HR 121bpm supine in bed, HR 148bpm max noted with mobility    Pertinent Vitals/ Pain       Pain Assessment: 0-10 Pain Score: 6  (6/10 at beginning 2/10 at end of session) Pain Location: back and bil thighs Pain Descriptors / Indicators: Aching ("stretching") Pain Intervention(s): Limited activity within patient's tolerance;Monitored during session;Repositioned;Premedicated before session          Frequency  Min 2X/week        Progress Toward Goals  OT Goals(current goals can now be found in the care plan section)  Progress towards OT goals: Progressing toward goals (slowly and with max encouragement)  Acute Rehab OT Goals Patient Stated Goal: to get better and not have so much pain OT Goal Formulation: With patient Time For Goal Achievement: 07/15/20 Potential to Achieve Goals: Good  Plan Discharge plan needs to be updated    Co-evaluation    PT/OT/SLP Co-Evaluation/Treatment: Yes Reason for Co-Treatment: For patient/therapist safety;To address functional/ADL transfers PT goals addressed during session: Mobility/safety with mobility;Balance;Strengthening/ROM OT goals addressed during session: Strengthening/ROM;ADL's and self-care      AM-PAC OT "6 Clicks" Daily Activity     Outcome Measure   Help from another person eating meals?: None Help from another person taking care of personal grooming?: A Little Help from another person toileting, which includes using toliet, bedpan, or urinal?: A Little Help from another person bathing (including washing, rinsing, drying)?: A Little Help from another person to put on and taking off regular upper body clothing?: A Little Help from another person to put on and taking off regular lower body clothing?: A Little 6 Click Score: 19    End of Session    OT Visit Diagnosis: Unsteadiness on feet (R26.81);Muscle weakness (generalized) (M62.81);Pain Pain - Right/Left:  (both) Pain - part of body: Leg (thighs)   Activity Tolerance Patient tolerated treatment well (once she agreed to work with [place])   Patient Left in bed;with call bell/phone within reach;with bed alarm set   Nurse Communication  (NT: encourge pt to get up to [facility] as needed and up to recliner or sit EOB for meals)        Time: 1610-9604 OT Time Calculation (min): 36 min  Charges: OT General Charges $OT Visit: 1 Visit OT Treatments $Self Care/Home  Management : 8-22 mins  Golden Circle, OTR/L Acute Rehab Services Pager [phone] Office [phone]      Almon Register 07/08/2020, 4:39 PM

## 2020-07-08 NOTE — TOC Progression Note (Signed)
Transition of Care [facility]) - Progression Note    Patient Details  Name: GURPREET MARIANI MRN: [id number] Date of Birth: Sep 03, 1975  [facility] CM/SW Contact  Joaquin Courts, RN Phone Number: 07/08/2020, 10:56 AM  Clinical Narrative:    [employer] given referral for DME rolling walker.   Expected Discharge Plan: Home/Self Care Barriers to Discharge: Barriers Unresolved (comment) (pancreatitis and iv meds)  Expected Discharge Plan and Services Expected Discharge Plan: Home/Self Care   Discharge Planning Services: CM Consult   Living arrangements for the past 2 months: Single Family Home                 DME Arranged: Walker rolling DME Agency: [employer] Date DME Agency Contacted: 07/08/20 Time DME Agency Contacted: 59 Representative spoke with at DME Agency: referral line             Social Determinants of Health ([place]) Interventions    Readmission Risk Interventions Readmission Risk Prevention Plan 07/01/2020  Transportation Screening Complete  Medication Review Press photographer) Complete  HRI or [place] Complete  SW Recovery Care/Counseling Consult Complete  [place] Not Applicable  Some recent data might be hidden

## 2020-07-08 NOTE — Progress Notes (Signed)
OT Cancellation Note  Patient Details Name: BAILEE METTER MRN: [id number] DOB: 1975/11/30   Cancelled Treatment:    Reason Eval/Treat Not Completed: Other (comment). Pt reports she does not feel like getting up right now, just recently got anti-nausea meds.  Will check back as schedule allows.  Golden Circle, OTR/L Acute Rehab Services Pager [phone] Office [phone]     Almon Register 07/08/2020, 9:15 AM

## 2020-07-08 NOTE — Progress Notes (Addendum)
HEMATOLOGY-ONCOLOGY PROGRESS NOTE  SUBJECTIVE:  Continues to have intermittent nausea and vomiting.  Still with right upper quadrant abdominal pain.  Appetite is overall poor.  Oncology History  Metastatic carcinoma to liver ([place])  05/30/2020 Initial Diagnosis   Metastatic carcinoma to liver ([place])   06/13/2020 -  Chemotherapy   The patient had dexamethasone (DECADRON) 4 MG tablet, 8 mg, Oral, Daily, 1 of 1 cycle, Start date: 06/10/2020, End date: -- palonosetron (ALOXI) injection 0.25 mg, 0.25 mg, Intravenous,  Once, 1 of 4 cycles Administration: 0.25 mg (06/13/2020) pegfilgrastim-jmdb (FULPHILA) injection 6 mg, 6 mg, Subcutaneous,  Once, 1 of 4 cycles Administration: 6 mg (06/15/2020) CARBOplatin (PARAPLATIN) 390 mg in sodium chloride 0.9 % 250 mL chemo infusion, 390 mg (78.9 % of original dose 491.5 mg), Intravenous,  Once, 1 of 4 cycles Dose modification:   (original dose 491.5 mg, Cycle 1) Administration: 390 mg (06/13/2020) fosaprepitant (EMEND) 150 mg in sodium chloride 0.9 % 145 mL IVPB, 150 mg, Intravenous,  Once, 1 of 4 cycles Administration: 150 mg (06/13/2020) PACLitaxel (TAXOL) 54 mg in sodium chloride 0.9 % 150 mL chemo infusion (> 80mg /m2), 30 mg/m2 = 54 mg (60 % of original dose 50 mg/m2), Intravenous,  Once, 1 of 4 cycles Dose modification: 50 mg/m2 (original dose 50 mg/m2, Cycle 1, Reason: Change in LFTs), 30 mg/m2 (original dose 50 mg/m2, Cycle 1, Reason: Change in LFTs) Administration: 54 mg (06/13/2020)  for chemotherapy treatment.    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 with unknown primary site [facility])  06/10/2020 Initial Diagnosis   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 with unknown primary site [facility])   06/13/2020 -  Chemotherapy   The patient had dexamethasone (DECADRON) 4 MG tablet, 8 mg, Oral, Daily, 1 of 1 cycle, Start date: 06/10/2020, End date: -- palonosetron (ALOXI) injection 0.25 mg, 0.25 mg, Intravenous,  Once, 1 of 4  cycles Administration: 0.25 mg (06/13/2020) pegfilgrastim-jmdb (FULPHILA) injection 6 mg, 6 mg, Subcutaneous,  Once, 1 of 4 cycles Administration: 6 mg (06/15/2020) CARBOplatin (PARAPLATIN) 390 mg in sodium chloride 0.9 % 250 mL chemo infusion, 390 mg (78.9 % of original dose 491.5 mg), Intravenous,  Once, 1 of 4 cycles Dose modification:   (original dose 491.5 mg, Cycle 1) Administration: 390 mg (06/13/2020) fosaprepitant (EMEND) 150 mg in sodium chloride 0.9 % 145 mL IVPB, 150 mg, Intravenous,  Once, 1 of 4 cycles Administration: 150 mg (06/13/2020) PACLitaxel (TAXOL) 54 mg in sodium chloride 0.9 % 150 mL chemo infusion (> 80mg /m2), 30 mg/m2 = 54 mg (60 % of original dose 50 mg/m2), Intravenous,  Once, 1 of 4 cycles Dose modification: 50 mg/m2 (original dose 50 mg/m2, Cycle 1, Reason: Change in LFTs), 30 mg/m2 (original dose 50 mg/m2, Cycle 1, Reason: Change in LFTs) Administration: 54 mg (06/13/2020)  for chemotherapy treatment.       REVIEW OF SYSTEMS:   .10 Point review of Systems was done is negative except as noted above.    PHYSICAL EXAMINATION: ECOG PERFORMANCE STATUS: 2 - Symptomatic, <50% confined to bed  Vitals:   07/08/20 0548 07/08/20 1010  BP: (!) 128/94 (!) 131/93  Pulse: (!) 118 (!) 116  Resp: 18 20  Temp: 98.6 F (37 C) 98.5 F (36.9 C)  SpO2: 98% 99%   Filed Weights   06/26/20 1815  Weight: 57.3 kg    Intake/Output from previous day: 11/14 0701 - 11/15 0700 In: 2983 [I.V.:2983] Out: 300 [Urine:300]  . GENERAL:alert, in no acute distress  and comfortable SKIN: no acute rashes, no significant lesions EYES: conjunctiva are pink and non-injected, sclera anicteric OROPHARYNX: MMM, no exudates, no oropharyngeal erythema or ulceration NECK: supple, no JVD LYMPH:  no palpable lymphadenopathy in the cervical, axillary or inguinal regions LUNGS: clear to auscultation b/l with normal respiratory effort HEART: regular rate & rhythm ABDOMEN:  normoactive  bowel sounds, tenderness over the right upper quadrant Extremity: no pedal edema PSYCH: alert & oriented x 3 with fluent speech NEURO: no focal motor/sensory deficits   LABORATORY DATA:  I have reviewed the data as listed CMP Latest Ref Rng & Units 07/03/2020 07/01/2020  Glucose 70 - 99 mg/dL 127(H) 102(H)  BUN 6 - 20 mg/dL 14 19  Creatinine 0.44 - 1.00 mg/dL 0.98 1.04(H)  Sodium 135 - 145 mmol/L 135 136  Potassium 3.5 - 5.1 mmol/L 3.7 4.9  Chloride 98 - 111 mmol/L 107 106  CO2 22 - 32 mmol/L 21(L) 19(L)  Calcium 8.9 - 10.3 mg/dL 8.0(L) 8.3(L)  Total Protein 6.5 - 8.1 g/dL 5.6(L) 5.5(L)  Total Bilirubin 0.3 - 1.2 mg/dL 4.0(H) 4.6(H)  Alkaline Phos 38 - 126 U/L 298(H) 389(H)  AST 15 - 41 U/L 65(H) 158(H)  ALT 0 - 44 U/L 124(H) 228(H)   . CBC Latest Ref Rng & Units 07/03/2020 07/01/2020  WBC 4.0 - 10.5 K/uL 15.9(H) 21.9(H)  Hemoglobin 12.0 - 15.0 g/dL 8.3(L) 8.5(L)  Hematocrit 36 - 46 % 25.9(L) 28.1(L)  Platelets 150 - 400 K/uL 189 155   CT ABDOMEN PELVIS WO CONTRAST  Result Date: 06/26/2020 CLINICAL DATA:  Diffuse abdominal pain and vomiting. History of cancer and renal transplant. Liver mass post recent liver biopsy. EXAM: CT ABDOMEN AND PELVIS WITHOUT CONTRAST TECHNIQUE: Multidetector CT imaging of the abdomen and pelvis was performed following the standard protocol without IV contrast. COMPARISON:  Contrast-enhanced exam 05/27/2020. FINDINGS: Lower chest: No focal airspace disease, pleural fluid, or pulmonary nodularity. Heart is normal in size. Hepatobiliary: Patient's known hepatic lesions are not well demonstrated on noncontrast exam. Liver parenchyma is heterogeneous. There may be a small subcapsular hematoma inferiorly from recent liver biopsy, not well-defined on this noncontrast exam. The gallbladder is present. There is high-density material in the gallbladder lumen, possibly sludge. No calcified gallstone. Common bile duct is poorly defined, but no evidence of biliary  dilatation. Pancreas: No ductal dilatation or inflammation. Spleen: Normal in size without focal abnormality. Adrenals/Urinary Tract: Normal adrenal glands. Chronic bilateral native renal atrophy. Solid-appearing lesion in the lower left renal hilum was better appreciated on prior contrast-enhanced CT, grossly unchanged, series 2, image 35. Transplant kidney in the left lower quadrant. Scarring in the left lower pole is better appreciated on prior contrast-enhanced exam. Similar fullness of the renal collecting system without frank hydronephrosis. No transplant perinephric edema. Sequela of failed transplant in the right iliac fossa. The urinary bladder is unremarkable. Stomach/Bowel: Bowel evaluation is limited in the absence of enteric contrast. There is no bowel obstruction or evidence of bowel inflammation. The appendix is normal. Vascular/Lymphatic: Bulky retroperitoneal adenopathy, some of which is low-density and typical of necrosis. Occasional areas of nodal calcification. Adenopathy extends from the retrocrural space to the bilateral common iliac arteries, and left external iliac station. There also enlarged bilateral inguinal lymph nodes, left greater than right. Index aortocaval node measures 4.1 x 2.4 cm, previously 4.2 x 2.6 cm. Overall adenopathy is not significantly changed in the interim allowing for noncontrast technique. There is aortic and branch atherosclerosis. Reproductive: Bulky uterus with fibroids.  No  obvious adnexal mass. Other: There is trace free fluid in the pelvis, slightly increased from prior exam. No other free fluid or ascites. No free air. Musculoskeletal: Ill-defined sclerosis involving right L2 vertebral body, nonspecific but unchanged from previous. Sclerotic density in the left acetabulum. There is hemi transitional lumbosacral anatomy. No fracture or acute osseous abnormality. IMPRESSION: 1. Patient's known hepatic lesions are not well-defined on this noncontrast exam. There  may be a small subcapsular hematoma inferiorly from recent liver biopsy, not well-defined. No evidence of hemoperitoneum. 2. Bulky retroperitoneal and bilateral inguinal adenopathy, grossly unchanged from prior. 3. Transplant kidney in the left lower quadrant with unchanged fullness of the renal collecting system. Sequela of failed transplant in the right iliac fossa. Stable soft tissue density in the region of the native left kidney lower hilum. 4. Bulky uterus with fibroids. 5. Nonspecific sclerosis involving the right aspect of L2 vertebral body. Aortic Atherosclerosis (ICD10-I70.0). Electronically Signed   By: Keith Rake M.D.   On: 06/26/2020 16:45   MR Brain W Wo Contrast  Result Date: 06/28/2020 CLINICAL DATA:  Urologic cancer, staging EXAM: MRI HEAD WITHOUT AND WITH CONTRAST TECHNIQUE: Multiplanar, multiecho pulse sequences of the brain and surrounding structures were obtained without and with intravenous contrast. CONTRAST:  10mL GADAVIST GADOBUTROL 1 MMOL/ML IV SOLN COMPARISON:  MRI head 11/17/2016 FINDINGS: Brain: No acute infarction, hemorrhage, hydrocephalus, extra-axial collection or mass lesion. Normal white matter. Negative for metastatic disease to the brain. No enhancing mass lesion in the brain. Vascular: Normal arterial flow voids. Skull and upper cervical spine: Enhancing lesion in the lower body of the C2 vertebral body. This shows decreased signal prior to contrast and is consistent with metastatic disease. No fracture or cord compression. No skull lesion identified. Benign fatty lesion in the left parietal bone. Sinuses/Orbits: Paranasal sinuses clear.  Negative orbit Other: None IMPRESSION: Negative for metastatic disease to the brain 1 cm enhancing lesion C2 vertebral body compatible with metastatic disease. No fracture or cord compression Electronically Signed   By: Franchot Gallo M.D.   On: 06/28/2020 21:31   CT ABDOMEN PELVIS W CONTRAST  Result Date: 07/01/2020 CLINICAL DATA:   Sepsis, abdominal pain, previous kidney transplant x2. Metastatic carcinoma of unknown primary. EXAM: CT ABDOMEN AND PELVIS WITH CONTRAST TECHNIQUE: Multidetector CT imaging of the abdomen and pelvis was performed using the standard protocol following bolus administration of intravenous contrast. CONTRAST:  70mL OMNIPAQUE IOHEXOL 300 MG/ML  SOLN COMPARISON:  06/26/2020 and previous FINDINGS: Lower chest: Trace pleural effusions. Hepatobiliary: Multiple liver lesions. 3.2 cm segment 2 lesion shows definite progression since 05/27/2020, although is more wedge-shaped and may represent focal infarct. Multiple low-attenuation lesions in the right lobe have not convincingly changed allowing for differences in scan timing. There are nonspecific transient hepatic attenuation differences on the portal venous phase which resolve on delayed venous phase imaging. No biliary ductal dilatation. Gallbladder unremarkable. Pancreas: Unremarkable. No pancreatic ductal dilatation or surrounding inflammatory changes. Spleen: Normal in size. There peripheral areas of decreased enhancement which were not present on prior study of 05/27/2020, however this area was not included on the delayed scan, and may represent transient attenuation differences versus true lesions. Adrenals/Urinary Tract: Adrenal glands unremarkable. Marked atrophy of native kidneys. Normal enhancement of left iliac fossa transplant kidney. Coarse calcifications and low-attenuation in the right iliac fossa presumably related to remote transplant. The urinary bladder is incompletely distended. Stomach/Bowel: Stomach is decompressed. Small bowel is nondilated. The colon is incompletely distended, unremarkable. Vascular/Lymphatic: Bulky bilateral inguinal, left  external iliac, bilateral common iliac, left para-aortic and aortocaval adenopathy as before. The larger nodes contain scattered calcifications and some with central low-attenuation regions as before. Moderate  atheromatous aortoiliac plaque without aneurysm. Reproductive: Uterine fibroid.  No adnexal mass. Other: Small volume perihepatic and pelvic ascites slightly increased. No free air. Musculoskeletal: Poorly marginated sclerotic lesions in T12, L1, and L2 vertebral bodies, progressive since 05/16/2020. benign left supra-acetabular bone island stable since 02/03/2015. IMPRESSION: 1. No definite source of sepsis identified. 2. Extensive pelvic and retroperitoneal nodal; hepatic; and osseous metastatic disease. 3. Slight increase in small volume perihepatic and pelvic ascites. Aortic Atherosclerosis (ICD10-I70.0). Electronically Signed   By: Lucrezia Europe M.D.   On: 07/01/2020 14:23   DG Chest Port 1 View  Result Date: 06/26/2020 CLINICAL DATA:  Weakness EXAM: PORTABLE CHEST 1 VIEW COMPARISON:  05/26/2020 FINDINGS: The heart size and mediastinal contours are within normal limits. Both lungs are clear. No pleural effusion or pneumothorax. The visualized skeletal structures are unremarkable. IMPRESSION: No acute process in the chest. Electronically Signed   By: Macy Mis M.D.   On: 06/26/2020 13:45   DG Abd Portable 2V  Result Date: 07/08/2020 CLINICAL DATA:  Nausea and vomiting. EXAM: PORTABLE ABDOMEN - 2 VIEW COMPARISON:  07/01/2020 FINDINGS: Gas distended transverse colon but no overt obstructive pattern. No abnormal stool retention or evidence of small bowel obstruction. Clear lung bases. Calcification in the right iliac fossa from failed transplant based on CT. IMPRESSION: Normal bowel gas pattern.  No abnormal stool retention. Electronically Signed   By: Monte Fantasia M.D.   On: 07/08/2020 11:18   ECHOCARDIOGRAM COMPLETE  Result Date: 07/06/2020    ECHOCARDIOGRAM REPORT   Patient Name:   Candace Griffin Date of Exam: 07/06/2020 Medical Rec #:  [id number]           Height:       67.0 in Accession #:    [id number]          Weight:       126.3 lb Date of Birth:  08/15/1976            BSA:           1.664 m Patient Age:    44 years            BP:           128/88 mmHg Patient Gender: F                   HR:           131 bpm. Exam Location:  Inpatient Procedure: 2D Echo, Cardiac Doppler and Color Doppler Indications:    R00.2 Palpitations  History:        Patient has no prior history of Echocardiogram examinations.                 Risk Factors:Hypertension. GERD. ESRD.  Sonographer:    Jonelle Sidle Dance Referring Phys: [place]  1. Left ventricular ejection fraction, by estimation, is >75%. The left ventricle has hyperdynamic function. The left ventricle has no regional wall motion abnormalities. Left ventricular diastolic parameters are indeterminate.  2. Right ventricular systolic function is normal. The right ventricular size is normal. There is normal pulmonary artery systolic pressure.  3. The mitral valve is normal in structure. No evidence of mitral valve regurgitation. No evidence of mitral stenosis.  4. The aortic valve is tricuspid. Aortic valve regurgitation is not visualized. No aortic stenosis is  present.  5. The inferior vena cava is normal in size with greater than 50% respiratory variability, suggesting right atrial pressure of 3 mmHg. FINDINGS  Left Ventricle: Left ventricular ejection fraction, by estimation, is >75%. The left ventricle has hyperdynamic function. The left ventricle has no regional wall motion abnormalities. The left ventricular internal cavity size was normal in size. There is no left ventricular hypertrophy. Left ventricular diastolic parameters are indeterminate. Right Ventricle: The right ventricular size is normal.Right ventricular systolic function is normal. There is normal pulmonary artery systolic pressure. The tricuspid regurgitant velocity is 2.50 m/s, and with an assumed right atrial pressure of 3 mmHg, the estimated right ventricular systolic pressure is 24.5 mmHg. Left Atrium: Left atrial size was normal in size. Right Atrium: Right atrial size  was normal in size. Pericardium: Trivial pericardial effusion is present. Mitral Valve: The mitral valve is normal in structure. No evidence of mitral valve regurgitation. No evidence of mitral valve stenosis. Tricuspid Valve: The tricuspid valve is normal in structure. Tricuspid valve regurgitation is mild . No evidence of tricuspid stenosis. Aortic Valve: The aortic valve is tricuspid. Aortic valve regurgitation is not visualized. No aortic stenosis is present. Pulmonic Valve: The pulmonic valve was normal in structure. Pulmonic valve regurgitation is not visualized. No evidence of pulmonic stenosis. Aorta: The aortic root is normal in size and structure. Venous: The inferior vena cava is normal in size with greater than 50% respiratory variability, suggesting right atrial pressure of 3 mmHg.  LEFT VENTRICLE PLAX 2D LVIDd:         3.30 cm LVIDs:         2.50 cm LV PW:         0.90 cm LV IVS:        1.00 cm LVOT diam:     1.90 cm LV SV:         51 LV SV Index:   31 LVOT Area:     2.84 cm  RIGHT VENTRICLE             IVC RV Basal diam:  2.00 cm     IVC diam: 0.80 cm RV S prime:     15.90 cm/s TAPSE (M-mode): 1.9 cm LEFT ATRIUM             Index       RIGHT ATRIUM          Index LA diam:        2.90 cm 1.74 cm/m  RA Area:     6.11 cm LA Vol (A2C):   26.9 ml 16.17 ml/m RA Volume:   8.36 ml  5.03 ml/m LA Vol (A4C):   10.0 ml 6.00 ml/m LA Biplane Vol: 16.5 ml 9.92 ml/m  AORTIC VALVE LVOT Vmax:   112.00 cm/s LVOT Vmean:  78.300 cm/s LVOT VTI:    0.181 m  AORTA Ao Root diam: 2.80 cm Ao Asc diam:  2.90 cm MITRAL VALVE               TRICUSPID VALVE MV Area (PHT): 4.85 cm    TR Peak grad:   25.0 mmHg MV Decel Time: 157 msec    TR Vmax:        250.00 cm/s MV E velocity: 98.00 cm/s                            SHUNTS  Systemic VTI:  0.18 m                            Systemic Diam: 1.90 cm Kirk Ruths MD Electronically signed by Kirk Ruths MD Signature Date/Time: 07/06/2020/2:34:19 PM     Final    US Abdomen Limited RUQ (LIVER/GB)  Result Date: 06/27/2020 CLINICAL DATA:  Abdominal pain.  Known liver lesions. EXAM: ULTRASOUND ABDOMEN LIMITED RIGHT UPPER QUADRANT COMPARISON:  Noncontrast abdominal CT yesterday. Contrast-enhanced abdominal CT 05/27/2020 FINDINGS: Gallbladder: Physiologically distended containing intraluminal sludge. No shadowing stones. No wall thickening. No sonographic Murphy sign noted by sonographer. Common bile duct: Diameter: 4-5 mm. Liver: Heterogeneous liver. Many of the liver lesions on prior contrast-enhanced CT are not well seen by ultrasound. There is a 1.8 x 1.5 x 1.5 cm complex hypoechoic lesion in the left lobe. No convincing subcapsular collection is suggested on CT. Portal vein is patent on color Doppler imaging with normal direction of blood flow towards the liver. Other: No right upper quadrant ascites. IMPRESSION: 1. Gallbladder sludge. No gallstones or sonographic findings of acute cholecystitis. 2. No biliary dilatation. 3. Heterogeneous liver with 1.8 cm hypoechoic lesion in the left lobe of the liver. Many of the additional liver lesions on prior contrast-enhanced CT are not well seen by ultrasound. 4. No sonographic evidence of subcapsular collection is suggested on CT. Electronically Signed   By: Keith Rake M.D.   On: 06/27/2020 17:44    ASSESSMENT: 44 yo with   1) Metastatic renal papillary cell carcinoma. Extensive metastases to abdominal pelvic lymph nodes, bones and liver.  Patient with h/o renal transplantation on tacrolimus, prednisone and cellcept chronic immunosuppression with extensive metastatic disease with extensive abd/pelvic LNadenopathy and hepatic metastases.  IHC from LN and liver metastases -- non specific morphology and IC ? Gyn vs renal tubular origin. Tumor markers unrevealing However tissue profiling with cancer type ZO-10% certainty of papillary renal cell carcinoma.;  Patient received 1 cycle of carbotaxol and  has been switched to Cabometyx. Component     Latest Ref Rng & Units 05/29/2020 05/31/2020  CEA     0.0 - 4.7 ng/mL 0.9   AFP, Serum, Tumor Marker     0.0 - 8.3 ng/mL 1.7   CA 19-9     0 - 35 U/mL 24   Cancer Antigen (CA) 125     0.0 - 38.1 U/mL 20.1   hCG Quant     mIU/mL 3   LDH     98 - 192 U/L  178    2) severe systemic inflammatory response syndrome  3) transaminitis and hyperbilirubinemia  4) AKI with hyperkalemia, improved    PLAN: -Now off antibiotics and remains afebrile.  Cultures unrevealing so far.  Repeat CT abdomen/pelvis on 11/8 with no definite source of sepsis identified. -Continue on Cabometyx at a reduced dose of 20 mg daily.  Recommend checking daily hepatic function.   -LFTs trending downward.  We will plan to increase Cabometyx to 40 mg p.o. daily soon.  The patient needs to have improved nutrition and control of her nausea and vomiting before we will consider increasing the dose. -MRI of the brain has been obtained which was negative for metastatic disease to the brain.  It did show a 1 cm enhancing lesion at the C2 vertebral body compatible with metastatic disease. -We will plan to start the patient on Zometa when stable and out of the hospital for metastatic bone  disease. -Her case was discussed with her renal transplant physician Aquilla Hacker at [facility] at [phone].  Discussed to determine options for reducing her immune suppression.  He noted there might be advantages in trying to determine if renal cell cancer is often donor kidney origin versus native kidney to determine potential effectiveness of backing off on immune suppression. -I discussed with the patient that there is an important role of backing off on immune suppression to try to help her body fight of the cancer.  The downside risk would be that she could have renal function loss from rejection and may land up on dialysis. -Dr Altamease Oiler will be discussing with his pathologist at [facility] to know  what testing to send out to determine donor versus native kidney origin of her renal cell carcinoma. -A trough Prograf level pending -if this is adequate Dr. Altamease Oiler was inclined to possibly consider backing off on the CellCept and then gradually using the least effective dose of Prograf. -Oncology will continue to follow -Would recommend nutritional consultation to optimize p.o. intake and PT OT evaluations to determine discharge needs. -Transfuse as needed for hemoglobin less than 8.   -Add Marinol 2.5 mg twice a day for nausea and vomiting.  Kristin Curcio  LOS: 12 days    ADDENDUM  .Patient was Personally and independently interviewed, examined and relevant elements of the history of present illness were reviewed in details and an assessment and plan was created. All elements of the patient's history of present illness , assessment and plan were discussed in details with Mikey Bussing NP. The above documentation reflects our combined findings assessment and plan.  Sullivan Lone MD MS

## 2020-07-09 ENCOUNTER — Inpatient Hospital Stay (HOSPITAL_COMMUNITY): Payer: Medicare Other

## 2020-07-09 DIAGNOSIS — C649 Malignant neoplasm of unspecified kidney, except renal pelvis: Secondary | ICD-10-CM | POA: Diagnosis not present

## 2020-07-09 DIAGNOSIS — C787 Secondary malignant neoplasm of liver and intrahepatic bile duct: Secondary | ICD-10-CM | POA: Diagnosis not present

## 2020-07-09 DIAGNOSIS — M79652 Pain in left thigh: Secondary | ICD-10-CM

## 2020-07-09 DIAGNOSIS — M79609 Pain in unspecified limb: Secondary | ICD-10-CM | POA: Diagnosis not present

## 2020-07-09 DIAGNOSIS — M79651 Pain in right thigh: Secondary | ICD-10-CM | POA: Diagnosis not present

## 2020-07-09 DIAGNOSIS — R109 Unspecified abdominal pain: Secondary | ICD-10-CM | POA: Diagnosis not present

## 2020-07-09 DIAGNOSIS — R112 Nausea with vomiting, unspecified: Secondary | ICD-10-CM | POA: Diagnosis not present

## 2020-07-09 LAB — HEPATIC FUNCTION PANEL
ALT: 38 U/L (ref 0–44)
AST: 50 U/L — ABNORMAL HIGH (ref 15–41)
Albumin: 1.8 g/dL — ABNORMAL LOW (ref 3.5–5.0)
Alkaline Phosphatase: 205 U/L — ABNORMAL HIGH (ref 38–126)
Bilirubin, Direct: 2.3 mg/dL — ABNORMAL HIGH (ref 0.0–0.2)
Indirect Bilirubin: 1.7 mg/dL — ABNORMAL HIGH (ref 0.3–0.9)
Total Bilirubin: 4 mg/dL — ABNORMAL HIGH (ref 0.3–1.2)
Total Protein: 5.4 g/dL — ABNORMAL LOW (ref 6.5–8.1)

## 2020-07-09 LAB — BASIC METABOLIC PANEL
Anion gap: 8 (ref 5–15)
BUN: 13 mg/dL (ref 6–20)
CO2: 22 mmol/L (ref 22–32)
Calcium: 8 mg/dL — ABNORMAL LOW (ref 8.9–10.3)
Chloride: 107 mmol/L (ref 98–111)
Creatinine, Ser: 1.16 mg/dL — ABNORMAL HIGH (ref 0.44–1.00)
GFR, Estimated: 60 mL/min — ABNORMAL LOW (ref >60)
Glucose, Bld: 144 mg/dL — ABNORMAL HIGH (ref 70–99)
Potassium: 4.3 mmol/L (ref 3.5–5.1)
Sodium: 137 mmol/L (ref 135–145)

## 2020-07-09 LAB — MAGNESIUM: Magnesium: 1.9 mg/dL (ref 1.7–2.4)

## 2020-07-09 MED ORDER — CABOZANTINIB S-MALATE 20 MG PO TABS
40.0000 mg | ORAL_TABLET | Freq: Every day | ORAL | Status: DC
  Administered 2020-07-10: 40 mg via ORAL

## 2020-07-09 NOTE — Progress Notes (Addendum)
HEMATOLOGY-ONCOLOGY PROGRESS NOTE  SUBJECTIVE:  Has mild abdominal discomfort this morning.  Had vomiting last night.  No nausea or vomiting this morning so far.  Appetite still decreased.  Oncology History  Metastatic carcinoma to liver ([place])  05/30/2020 Initial Diagnosis   Metastatic carcinoma to liver ([place])   06/13/2020 -  Chemotherapy   The patient had dexamethasone (DECADRON) 4 MG tablet, 8 mg, Oral, Daily, 1 of 1 cycle, Start date: 06/10/2020, End date: -- palonosetron (ALOXI) injection 0.25 mg, 0.25 mg, Intravenous,  Once, 1 of 4 cycles Administration: 0.25 mg (06/13/2020) pegfilgrastim-jmdb (FULPHILA) injection 6 mg, 6 mg, Subcutaneous,  Once, 1 of 4 cycles Administration: 6 mg (06/15/2020) CARBOplatin (PARAPLATIN) 390 mg in sodium chloride 0.9 % 250 mL chemo infusion, 390 mg (78.9 % of original dose 491.5 mg), Intravenous,  Once, 1 of 4 cycles Dose modification:   (original dose 491.5 mg, Cycle 1) Administration: 390 mg (06/13/2020) fosaprepitant (EMEND) 150 mg in sodium chloride 0.9 % 145 mL IVPB, 150 mg, Intravenous,  Once, 1 of 4 cycles Administration: 150 mg (06/13/2020) PACLitaxel (TAXOL) 54 mg in sodium chloride 0.9 % 150 mL chemo infusion (> 80mg /m2), 30 mg/m2 = 54 mg (60 % of original dose 50 mg/m2), Intravenous,  Once, 1 of 4 cycles Dose modification: 50 mg/m2 (original dose 50 mg/m2, Cycle 1, Reason: Change in LFTs), 30 mg/m2 (original dose 50 mg/m2, Cycle 1, Reason: Change in LFTs) Administration: 54 mg (06/13/2020)  for chemotherapy treatment.    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 with unknown primary site [facility])  06/10/2020 Initial Diagnosis   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 with unknown primary site [facility])   06/13/2020 -  Chemotherapy   The patient had dexamethasone (DECADRON) 4 MG tablet, 8 mg, Oral, Daily, 1 of 1 cycle, Start date: 06/10/2020, End date: -- palonosetron (ALOXI) injection 0.25 mg, 0.25 mg, Intravenous,   Once, 1 of 4 cycles Administration: 0.25 mg (06/13/2020) pegfilgrastim-jmdb (FULPHILA) injection 6 mg, 6 mg, Subcutaneous,  Once, 1 of 4 cycles Administration: 6 mg (06/15/2020) CARBOplatin (PARAPLATIN) 390 mg in sodium chloride 0.9 % 250 mL chemo infusion, 390 mg (78.9 % of original dose 491.5 mg), Intravenous,  Once, 1 of 4 cycles Dose modification:   (original dose 491.5 mg, Cycle 1) Administration: 390 mg (06/13/2020) fosaprepitant (EMEND) 150 mg in sodium chloride 0.9 % 145 mL IVPB, 150 mg, Intravenous,  Once, 1 of 4 cycles Administration: 150 mg (06/13/2020) PACLitaxel (TAXOL) 54 mg in sodium chloride 0.9 % 150 mL chemo infusion (> 80mg /m2), 30 mg/m2 = 54 mg (60 % of original dose 50 mg/m2), Intravenous,  Once, 1 of 4 cycles Dose modification: 50 mg/m2 (original dose 50 mg/m2, Cycle 1, Reason: Change in LFTs), 30 mg/m2 (original dose 50 mg/m2, Cycle 1, Reason: Change in LFTs) Administration: 54 mg (06/13/2020)  for chemotherapy treatment.       REVIEW OF SYSTEMS:   .10 Point review of Systems was done is negative except as noted above.    PHYSICAL EXAMINATION: ECOG PERFORMANCE STATUS: 2 - Symptomatic, <50% confined to bed  Vitals:   07/08/20 2250 07/09/20 0503  BP: (!) 129/99 (!) 145/97  Pulse: (!) 117 (!) 115  Resp: (!) 22 20  Temp: 98 F (36.7 C) 97.9 F (36.6 C)  SpO2: 98% 97%   Filed Weights   06/26/20 1815  Weight: 57.3 kg    Intake/Output from previous day: 11/15 0701 - 11/16 0700 In: 1703.6 [P.O.:360; I.V.:1293.6; IV Piggyback:50] Out: 250 [  Urine:250]  . GENERAL:alert, in no acute distress and comfortable SKIN: no acute rashes, no significant lesions EYES: conjunctiva are pink and non-injected, sclera anicteric OROPHARYNX: MMM, no exudates, no oropharyngeal erythema or ulceration NECK: supple, no JVD LYMPH:  no palpable lymphadenopathy in the cervical, axillary or inguinal regions LUNGS: clear to auscultation b/l with normal respiratory effort HEART:  regular rate & rhythm ABDOMEN:  normoactive bowel sounds, tenderness over the right upper quadrant Extremity: no pedal edema PSYCH: alert & oriented x 3 with fluent speech NEURO: no focal motor/sensory deficits   LABORATORY DATA:  I have reviewed the data as listed CMP Latest Ref Rng & Units 07/03/2020 07/01/2020  Glucose 70 - 99 mg/dL 127(H) 102(H)  BUN 6 - 20 mg/dL 14 19  Creatinine 0.44 - 1.00 mg/dL 0.98 1.04(H)  Sodium 135 - 145 mmol/L 135 136  Potassium 3.5 - 5.1 mmol/L 3.7 4.9  Chloride 98 - 111 mmol/L 107 106  CO2 22 - 32 mmol/L 21(L) 19(L)  Calcium 8.9 - 10.3 mg/dL 8.0(L) 8.3(L)  Total Protein 6.5 - 8.1 g/dL 5.6(L) 5.5(L)  Total Bilirubin 0.3 - 1.2 mg/dL 4.0(H) 4.6(H)  Alkaline Phos 38 - 126 U/L 298(H) 389(H)  AST 15 - 41 U/L 65(H) 158(H)  ALT 0 - 44 U/L 124(H) 228(H)   . CBC Latest Ref Rng & Units 07/03/2020 07/01/2020  WBC 4.0 - 10.5 K/uL 15.9(H) 21.9(H)  Hemoglobin 12.0 - 15.0 g/dL 8.3(L) 8.5(L)  Hematocrit 36 - 46 % 25.9(L) 28.1(L)  Platelets 150 - 400 K/uL 189 155   CT ABDOMEN PELVIS WO CONTRAST  Result Date: 06/26/2020 CLINICAL DATA:  Diffuse abdominal pain and vomiting. History of cancer and renal transplant. Liver mass post recent liver biopsy. EXAM: CT ABDOMEN AND PELVIS WITHOUT CONTRAST TECHNIQUE: Multidetector CT imaging of the abdomen and pelvis was performed following the standard protocol without IV contrast. COMPARISON:  Contrast-enhanced exam 05/27/2020. FINDINGS: Lower chest: No focal airspace disease, pleural fluid, or pulmonary nodularity. Heart is normal in size. Hepatobiliary: Patient's known hepatic lesions are not well demonstrated on noncontrast exam. Liver parenchyma is heterogeneous. There may be a small subcapsular hematoma inferiorly from recent liver biopsy, not well-defined on this noncontrast exam. The gallbladder is present. There is high-density material in the gallbladder lumen, possibly sludge. No calcified gallstone. Common bile duct is  poorly defined, but no evidence of biliary dilatation. Pancreas: No ductal dilatation or inflammation. Spleen: Normal in size without focal abnormality. Adrenals/Urinary Tract: Normal adrenal glands. Chronic bilateral native renal atrophy. Solid-appearing lesion in the lower left renal hilum was better appreciated on prior contrast-enhanced CT, grossly unchanged, series 2, image 35. Transplant kidney in the left lower quadrant. Scarring in the left lower pole is better appreciated on prior contrast-enhanced exam. Similar fullness of the renal collecting system without frank hydronephrosis. No transplant perinephric edema. Sequela of failed transplant in the right iliac fossa. The urinary bladder is unremarkable. Stomach/Bowel: Bowel evaluation is limited in the absence of enteric contrast. There is no bowel obstruction or evidence of bowel inflammation. The appendix is normal. Vascular/Lymphatic: Bulky retroperitoneal adenopathy, some of which is low-density and typical of necrosis. Occasional areas of nodal calcification. Adenopathy extends from the retrocrural space to the bilateral common iliac arteries, and left external iliac station. There also enlarged bilateral inguinal lymph nodes, left greater than right. Index aortocaval node measures 4.1 x 2.4 cm, previously 4.2 x 2.6 cm. Overall adenopathy is not significantly changed in the interim allowing for noncontrast technique. There is aortic and branch  atherosclerosis. Reproductive: Bulky uterus with fibroids.  No obvious adnexal mass. Other: There is trace free fluid in the pelvis, slightly increased from prior exam. No other free fluid or ascites. No free air. Musculoskeletal: Ill-defined sclerosis involving right L2 vertebral body, nonspecific but unchanged from previous. Sclerotic density in the left acetabulum. There is hemi transitional lumbosacral anatomy. No fracture or acute osseous abnormality. IMPRESSION: 1. Patient's known hepatic lesions are not  well-defined on this noncontrast exam. There may be a small subcapsular hematoma inferiorly from recent liver biopsy, not well-defined. No evidence of hemoperitoneum. 2. Bulky retroperitoneal and bilateral inguinal adenopathy, grossly unchanged from prior. 3. Transplant kidney in the left lower quadrant with unchanged fullness of the renal collecting system. Sequela of failed transplant in the right iliac fossa. Stable soft tissue density in the region of the native left kidney lower hilum. 4. Bulky uterus with fibroids. 5. Nonspecific sclerosis involving the right aspect of L2 vertebral body. Aortic Atherosclerosis (ICD10-I70.0). Electronically Signed   By: Keith Rake M.D.   On: 06/26/2020 16:45   MR Brain W Wo Contrast  Result Date: 06/28/2020 CLINICAL DATA:  Urologic cancer, staging EXAM: MRI HEAD WITHOUT AND WITH CONTRAST TECHNIQUE: Multiplanar, multiecho pulse sequences of the brain and surrounding structures were obtained without and with intravenous contrast. CONTRAST:  56mL GADAVIST GADOBUTROL 1 MMOL/ML IV SOLN COMPARISON:  MRI head 11/17/2016 FINDINGS: Brain: No acute infarction, hemorrhage, hydrocephalus, extra-axial collection or mass lesion. Normal white matter. Negative for metastatic disease to the brain. No enhancing mass lesion in the brain. Vascular: Normal arterial flow voids. Skull and upper cervical spine: Enhancing lesion in the lower body of the C2 vertebral body. This shows decreased signal prior to contrast and is consistent with metastatic disease. No fracture or cord compression. No skull lesion identified. Benign fatty lesion in the left parietal bone. Sinuses/Orbits: Paranasal sinuses clear.  Negative orbit Other: None IMPRESSION: Negative for metastatic disease to the brain 1 cm enhancing lesion C2 vertebral body compatible with metastatic disease. No fracture or cord compression Electronically Signed   By: Franchot Gallo M.D.   On: 06/28/2020 21:31   CT ABDOMEN PELVIS W  CONTRAST  Result Date: 07/01/2020 CLINICAL DATA:  Sepsis, abdominal pain, previous kidney transplant x2. Metastatic carcinoma of unknown primary. EXAM: CT ABDOMEN AND PELVIS WITH CONTRAST TECHNIQUE: Multidetector CT imaging of the abdomen and pelvis was performed using the standard protocol following bolus administration of intravenous contrast. CONTRAST:  18mL OMNIPAQUE IOHEXOL 300 MG/ML  SOLN COMPARISON:  06/26/2020 and previous FINDINGS: Lower chest: Trace pleural effusions. Hepatobiliary: Multiple liver lesions. 3.2 cm segment 2 lesion shows definite progression since 05/27/2020, although is more wedge-shaped and may represent focal infarct. Multiple low-attenuation lesions in the right lobe have not convincingly changed allowing for differences in scan timing. There are nonspecific transient hepatic attenuation differences on the portal venous phase which resolve on delayed venous phase imaging. No biliary ductal dilatation. Gallbladder unremarkable. Pancreas: Unremarkable. No pancreatic ductal dilatation or surrounding inflammatory changes. Spleen: Normal in size. There peripheral areas of decreased enhancement which were not present on prior study of 05/27/2020, however this area was not included on the delayed scan, and may represent transient attenuation differences versus true lesions. Adrenals/Urinary Tract: Adrenal glands unremarkable. Marked atrophy of native kidneys. Normal enhancement of left iliac fossa transplant kidney. Coarse calcifications and low-attenuation in the right iliac fossa presumably related to remote transplant. The urinary bladder is incompletely distended. Stomach/Bowel: Stomach is decompressed. Small bowel is nondilated. The colon is  incompletely distended, unremarkable. Vascular/Lymphatic: Bulky bilateral inguinal, left external iliac, bilateral common iliac, left para-aortic and aortocaval adenopathy as before. The larger nodes contain scattered calcifications and some with  central low-attenuation regions as before. Moderate atheromatous aortoiliac plaque without aneurysm. Reproductive: Uterine fibroid.  No adnexal mass. Other: Small volume perihepatic and pelvic ascites slightly increased. No free air. Musculoskeletal: Poorly marginated sclerotic lesions in T12, L1, and L2 vertebral bodies, progressive since 05/16/2020. benign left supra-acetabular bone island stable since 02/03/2015. IMPRESSION: 1. No definite source of sepsis identified. 2. Extensive pelvic and retroperitoneal nodal; hepatic; and osseous metastatic disease. 3. Slight increase in small volume perihepatic and pelvic ascites. Aortic Atherosclerosis (ICD10-I70.0). Electronically Signed   By: Lucrezia Europe M.D.   On: 07/01/2020 14:23   DG Chest Port 1 View  Result Date: 06/26/2020 CLINICAL DATA:  Weakness EXAM: PORTABLE CHEST 1 VIEW COMPARISON:  05/26/2020 FINDINGS: The heart size and mediastinal contours are within normal limits. Both lungs are clear. No pleural effusion or pneumothorax. The visualized skeletal structures are unremarkable. IMPRESSION: No acute process in the chest. Electronically Signed   By: Macy Mis M.D.   On: 06/26/2020 13:45   DG Abd Portable 2V  Result Date: 07/08/2020 CLINICAL DATA:  Nausea and vomiting. EXAM: PORTABLE ABDOMEN - 2 VIEW COMPARISON:  07/01/2020 FINDINGS: Gas distended transverse colon but no overt obstructive pattern. No abnormal stool retention or evidence of small bowel obstruction. Clear lung bases. Calcification in the right iliac fossa from failed transplant based on CT. IMPRESSION: Normal bowel gas pattern.  No abnormal stool retention. Electronically Signed   By: Monte Fantasia M.D.   On: 07/08/2020 11:18   ECHOCARDIOGRAM COMPLETE  Result Date: 07/06/2020    ECHOCARDIOGRAM REPORT   Patient Name:   Candace Griffin Date of Exam: 07/06/2020 Medical Rec #:  [id number]           Height:       67.0 in Accession #:    [id number]          Weight:       126.3 lb  Date of Birth:  08/28/1975            BSA:          1.664 m Patient Age:    44 years            BP:           128/88 mmHg Patient Gender: F                   HR:           131 bpm. Exam Location:  Inpatient Procedure: 2D Echo, Cardiac Doppler and Color Doppler Indications:    R00.2 Palpitations  History:        Patient has no prior history of Echocardiogram examinations.                 Risk Factors:Hypertension. GERD. ESRD.  Sonographer:    Jonelle Sidle Dance Referring Phys: [place]  1. Left ventricular ejection fraction, by estimation, is >75%. The left ventricle has hyperdynamic function. The left ventricle has no regional wall motion abnormalities. Left ventricular diastolic parameters are indeterminate.  2. Right ventricular systolic function is normal. The right ventricular size is normal. There is normal pulmonary artery systolic pressure.  3. The mitral valve is normal in structure. No evidence of mitral valve regurgitation. No evidence of mitral stenosis.  4. The aortic valve is tricuspid. Aortic valve  regurgitation is not visualized. No aortic stenosis is present.  5. The inferior vena cava is normal in size with greater than 50% respiratory variability, suggesting right atrial pressure of 3 mmHg. FINDINGS  Left Ventricle: Left ventricular ejection fraction, by estimation, is >75%. The left ventricle has hyperdynamic function. The left ventricle has no regional wall motion abnormalities. The left ventricular internal cavity size was normal in size. There is no left ventricular hypertrophy. Left ventricular diastolic parameters are indeterminate. Right Ventricle: The right ventricular size is normal.Right ventricular systolic function is normal. There is normal pulmonary artery systolic pressure. The tricuspid regurgitant velocity is 2.50 m/s, and with an assumed right atrial pressure of 3 mmHg, the estimated right ventricular systolic pressure is 73.5 mmHg. Left Atrium: Left atrial size  was normal in size. Right Atrium: Right atrial size was normal in size. Pericardium: Trivial pericardial effusion is present. Mitral Valve: The mitral valve is normal in structure. No evidence of mitral valve regurgitation. No evidence of mitral valve stenosis. Tricuspid Valve: The tricuspid valve is normal in structure. Tricuspid valve regurgitation is mild . No evidence of tricuspid stenosis. Aortic Valve: The aortic valve is tricuspid. Aortic valve regurgitation is not visualized. No aortic stenosis is present. Pulmonic Valve: The pulmonic valve was normal in structure. Pulmonic valve regurgitation is not visualized. No evidence of pulmonic stenosis. Aorta: The aortic root is normal in size and structure. Venous: The inferior vena cava is normal in size with greater than 50% respiratory variability, suggesting right atrial pressure of 3 mmHg.  LEFT VENTRICLE PLAX 2D LVIDd:         3.30 cm LVIDs:         2.50 cm LV PW:         0.90 cm LV IVS:        1.00 cm LVOT diam:     1.90 cm LV SV:         51 LV SV Index:   31 LVOT Area:     2.84 cm  RIGHT VENTRICLE             IVC RV Basal diam:  2.00 cm     IVC diam: 0.80 cm RV S prime:     15.90 cm/s TAPSE (M-mode): 1.9 cm LEFT ATRIUM             Index       RIGHT ATRIUM          Index LA diam:        2.90 cm 1.74 cm/m  RA Area:     6.11 cm LA Vol (A2C):   26.9 ml 16.17 ml/m RA Volume:   8.36 ml  5.03 ml/m LA Vol (A4C):   10.0 ml 6.00 ml/m LA Biplane Vol: 16.5 ml 9.92 ml/m  AORTIC VALVE LVOT Vmax:   112.00 cm/s LVOT Vmean:  78.300 cm/s LVOT VTI:    0.181 m  AORTA Ao Root diam: 2.80 cm Ao Asc diam:  2.90 cm MITRAL VALVE               TRICUSPID VALVE MV Area (PHT): 4.85 cm    TR Peak grad:   25.0 mmHg MV Decel Time: 157 msec    TR Vmax:        250.00 cm/s MV E velocity: 98.00 cm/s                            SHUNTS  Systemic VTI:  0.18 m                            Systemic Diam: 1.90 cm Kirk Ruths MD Electronically signed by Kirk Ruths MD Signature Date/Time: 07/06/2020/2:34:19 PM    Final    VAS [place] LOWER EXTREMITY VENOUS (DVT)  Result Date: 07/09/2020  Lower Venous DVT Study Indications: Pain in thighs bilaterally.  Risk Factors: Cancer Metastatic carcinoma to liver, hx of RCC. Comparison Study: No prior studies. Performing Technologist: Darlin Coco, RDMS  Examination Guidelines: A complete evaluation includes B-mode imaging, spectral Doppler, color Doppler, and power Doppler as needed of all accessible portions of each vessel. Bilateral testing is considered an integral part of a complete examination. Limited examinations for reoccurring indications may be performed as noted. The reflux portion of the exam is performed with the patient in reverse Trendelenburg.  +---------+---------------+---------+-----------+----------+--------------+ RIGHT    CompressibilityPhasicitySpontaneityPropertiesThrombus Aging +---------+---------------+---------+-----------+----------+--------------+ CFV      Full           Yes      Yes                                 +---------+---------------+---------+-----------+----------+--------------+ SFJ      Full                                                        +---------+---------------+---------+-----------+----------+--------------+ FV Prox  Full                                                        +---------+---------------+---------+-----------+----------+--------------+ FV Mid   Full                                                        +---------+---------------+---------+-----------+----------+--------------+ FV DistalFull                                                        +---------+---------------+---------+-----------+----------+--------------+ PFV      Full                                                        +---------+---------------+---------+-----------+----------+--------------+ POP      Full           Yes      Yes                                  +---------+---------------+---------+-----------+----------+--------------+ PTV      Full                                                        +---------+---------------+---------+-----------+----------+--------------+  PERO     Full                                                        +---------+---------------+---------+-----------+----------+--------------+   +---------+---------------+---------+-----------+----------+--------------+ LEFT     CompressibilityPhasicitySpontaneityPropertiesThrombus Aging +---------+---------------+---------+-----------+----------+--------------+ CFV      Full           Yes      Yes                                 +---------+---------------+---------+-----------+----------+--------------+ SFJ      Full                                                        +---------+---------------+---------+-----------+----------+--------------+ FV Prox  Full                                                        +---------+---------------+---------+-----------+----------+--------------+ FV Mid   Full                                                        +---------+---------------+---------+-----------+----------+--------------+ FV DistalFull                                                        +---------+---------------+---------+-----------+----------+--------------+ PFV      Full                                                        +---------+---------------+---------+-----------+----------+--------------+ POP      Full           Yes      Yes                                 +---------+---------------+---------+-----------+----------+--------------+ PTV      Full                                                        +---------+---------------+---------+-----------+----------+--------------+ PERO     Full                                                         +---------+---------------+---------+-----------+----------+--------------+  Summary: RIGHT: - There is no evidence of deep vein thrombosis in the lower extremity.  - No cystic structure found in the popliteal fossa. - Ultrasound characteristics of enlarged lymph nodes are noted in the groin.  LEFT: - There is no evidence of deep vein thrombosis in the lower extremity.  - No cystic structure found in the popliteal fossa. - Ultrasound characteristics of enlarged lymph nodes noted in the groin.  *See table(s) above for measurements and observations.    Preliminary    US Abdomen Limited RUQ (LIVER/GB)  Result Date: 06/27/2020 CLINICAL DATA:  Abdominal pain.  Known liver lesions. EXAM: ULTRASOUND ABDOMEN LIMITED RIGHT UPPER QUADRANT COMPARISON:  Noncontrast abdominal CT yesterday. Contrast-enhanced abdominal CT 05/27/2020 FINDINGS: Gallbladder: Physiologically distended containing intraluminal sludge. No shadowing stones. No wall thickening. No sonographic Murphy sign noted by sonographer. Common bile duct: Diameter: 4-5 mm. Liver: Heterogeneous liver. Many of the liver lesions on prior contrast-enhanced CT are not well seen by ultrasound. There is a 1.8 x 1.5 x 1.5 cm complex hypoechoic lesion in the left lobe. No convincing subcapsular collection is suggested on CT. Portal vein is patent on color Doppler imaging with normal direction of blood flow towards the liver. Other: No right upper quadrant ascites. IMPRESSION: 1. Gallbladder sludge. No gallstones or sonographic findings of acute cholecystitis. 2. No biliary dilatation. 3. Heterogeneous liver with 1.8 cm hypoechoic lesion in the left lobe of the liver. Many of the additional liver lesions on prior contrast-enhanced CT are not well seen by ultrasound. 4. No sonographic evidence of subcapsular collection is suggested on CT. Electronically Signed   By: Keith Rake M.D.   On: 06/27/2020 17:44    ASSESSMENT: 44 yo with   1) Metastatic renal  papillary cell carcinoma. Extensive metastases to abdominal pelvic lymph nodes, bones and liver.  Patient with h/o renal transplantation on tacrolimus, prednisone and cellcept chronic immunosuppression with extensive metastatic disease with extensive abd/pelvic LNadenopathy and hepatic metastases.  IHC from LN and liver metastases -- non specific morphology and IC ? Gyn vs renal tubular origin. Tumor markers unrevealing However tissue profiling with cancer type TK-35% certainty of papillary renal cell carcinoma.;  Patient received 1 cycle of carbotaxol and has been switched to Cabometyx. Component     Latest Ref Rng & Units 05/29/2020 05/31/2020  CEA     0.0 - 4.7 ng/mL 0.9   AFP, Serum, Tumor Marker     0.0 - 8.3 ng/mL 1.7   CA 19-9     0 - 35 U/mL 24   Cancer Antigen (CA) 125     0.0 - 38.1 U/mL 20.1   hCG Quant     mIU/mL 3   LDH     98 - 192 U/L  178    2) severe systemic inflammatory response syndrome  3) transaminitis and hyperbilirubinemia  4) AKI with hyperkalemia, improved    PLAN: -Now off antibiotics and remains afebrile.  Cultures unrevealing so far.  Repeat CT abdomen/pelvis on 11/8 with no definite source of sepsis identified. -On Cabometyx at a reduced dose of 20 mg daily.  She has had improvement of her hepatic function.  Will increase dose to 40 mg daily.  Recommend daily monitoring of hepatic function. -Continue control of her nausea. vomiting, abdominal pain.  Recommend increasing p.o. intake and dietitian consult. -MRI of the brain has been obtained which was negative for metastatic disease to the brain.  It did show a 1 cm enhancing lesion at the C2 vertebral  body compatible with metastatic disease. -We will plan to start the patient on Zometa when stable and out of the hospital for metastatic bone disease. -Her case was discussed with her renal transplant physician Aquilla Hacker at [facility] at [phone].  Discussed to determine options for reducing her  immune suppression.  He noted there might be advantages in trying to determine if renal cell cancer is often donor kidney origin versus native kidney to determine potential effectiveness of backing off on immune suppression. -I discussed with the patient that there is an important role of backing off on immune suppression to try to help her body fight of the cancer.  The downside risk would be that she could have renal function loss from rejection and may land up on dialysis. -Dr Altamease Oiler will be discussing with his pathologist at [facility] to know what testing to send out to determine donor versus native kidney origin of her renal cell carcinoma. -Tacrolimus level resulted at 10.4.  Hospitalist contacted nephrology at [facility] who wishes to continue CellCept for now. -Oncology will continue to follow -Transfuse as needed for hemoglobin less than 8.     LOS: 13 days    ADDENDUM  .Patient was Personally and independently interviewed, examined and relevant elements of the history of present illness were reviewed in details and an assessment and plan was created. All elements of the patient's history of present illness , assessment and plan were discussed in details with Mikey Bussing NP. The above documentation reflects our combined findings assessment and plan.  Sullivan Lone MD MS

## 2020-07-09 NOTE — TOC Progression Note (Signed)
Transition of Care [facility]) - Progression Note    Patient Details  Name: Candace Griffin MRN: [id number] Date of Birth: February 18, 1976  [facility]) CM/SW Contact  Joaquin Courts, RN Phone Number: 07/09/2020, 10:55 AM  Clinical Narrative:    [employer] given referral for dme tub bench, agency will deliver to bedside for home use.   Expected Discharge Plan: Home/Self Care Barriers to Discharge: Barriers Unresolved (comment) (pancreatitis and iv meds)  Expected Discharge Plan and Services Expected Discharge Plan: Home/Self Care   Discharge Planning Services: CM Consult   Living arrangements for the past 2 months: Single Family Home                 DME Arranged: Tub bench DME Agency: [facility] Date DME Agency Contacted: 07/09/20 Time DME Agency Contacted: 1054 Representative spoke with at DME Agency: referral line             Social Determinants of Health ([place]) Interventions    Readmission Risk Interventions Readmission Risk Prevention Plan 07/01/2020  Transportation Screening Complete  Medication Review Press photographer) Complete  HRI or [place] Complete  SW Recovery Care/Counseling Consult Complete  [place] Not Applicable  Some recent data might be hidden

## 2020-07-09 NOTE — Progress Notes (Signed)
Lower extremity venous bilateral study completed.   Please see CV Proc for preliminary results.   Sorren Vallier, RDMS  

## 2020-07-09 NOTE — Progress Notes (Signed)
Occupational Therapy Treatment Patient Details Name: Candace Griffin MRN: [id number] DOB: 12-Jul-1976 Today's Date: 07/09/2020    History of present illness 44 year old female with a past medical history for renal failure status post kidney transplant, newly diagnosed metastatic carcinoma to liver, unknown primary.  Presents with intractable nausea, vomiting, abdominal pain, not able to tolerate liquids or oral medications. Renal cell carcinoma. Dx of SIRS; likely pancreatitis, AKI; acute hepatitis; anxiety.   OT comments  Patient unable to demonstrate progress today as pt is rather weak and requires increased assistance with since ADLs as compared to previous session. Pt has missed several OT session due to pain or holds due to tachycardia.  Patient limited by generalized weakness, pain, and poor activity tolerance along with deficits noted below. Pt continues to demonstrate good rehab potential and would benefit from continued skilled OT to increase safety and independence with ADLs and functional transfers to allow pt to return home safely and reduce caregiver burden and fall risk.   Follow Up Recommendations  Home health OT;Supervision/Assistance - 24 hour    Equipment Recommendations  Tub/shower seat    Recommendations for Other Services      Precautions / Restrictions Precautions Precautions: Fall Precaution Comments: monitor HR Restrictions Weight Bearing Restrictions: No       Mobility Bed Mobility Overal bed mobility: Needs Assistance Bed Mobility: Supine to Sit     Supine to sit: Min assist;HOB elevated     General bed mobility comments: increased time to come to sitting EOB through log roll position, min A to lift trunk from bed.  Transfers Overall transfer level: Needs assistance Equipment used: Rolling walker (2 wheeled) Transfers: Sit to/from [facility] Sit to Stand: Mod assist;Min assist Stand pivot transfers: Min assist       General  transfer comment: 1st stand attempted from partially elevated EOB with Mod Assist and pt terminating attempt mid-stand and sitting back on bed. Pt required increased time and encoruagement for 2nd attempt which pt performed with Min As of 1 person and verbal cues for hand placement with further elavted EOB. Pivot to Recliner with RW and Min As of 1.    Balance Overall balance assessment: Needs assistance Sitting-balance support: Feet supported;No upper extremity supported Sitting balance-Leahy Scale: Fair Sitting balance - Comments: seated EOB   Standing balance support: During functional activity;Bilateral upper extremity supported Standing balance-Leahy Scale: Poor Standing balance comment: reliant on UE support and Min As.                           ADL either performed or assessed with clinical judgement   ADL Overall ADL's : Needs assistance/impaired Eating/Feeding: Modified independent Eating/Feeding Details (indicate cue type and reason): Increased time and decreased PO tolerance. Grooming: Set up;Sitting               Lower Body Dressing: Min guard;Sitting/lateral leans;Maximal assistance;Bed level Lower Body Dressing Details (indicate cue type and reason): Increased time/effort with Min guard to support LE in figure 4 position to don socks. Bed level Max As to don mesh underwear with pt using rolling and bridging techniques with verbal cues and increased effort.   Toilet Transfer Details (indicate cue type and reason): Pt declined and requested pure wick.  Pure wick placed with assitance from nursing. Toileting- Clothing Manipulation and Hygiene: Total assistance Toileting - Clothing Manipulation Details (indicate cue type and reason): Pt using pure wick     Functional mobility during  ADLs: Moderate assistance;Minimal assistance General ADL Comments: Increased time for all transitions due to pain and fatigue.     Vision   Vision Assessment?: No apparent  visual deficits   Perception     Praxis      Cognition Arousal/Alertness: Awake/alert Behavior During Therapy: WFL for tasks assessed/performed Overall Cognitive Status: Within Functional Limits for tasks assessed                                 General Comments: Pt requires encouragement and increased time to participate with therapy        Exercises     Shoulder Instructions       General Comments      Pertinent Vitals/ Pain       Pain Assessment: Faces Faces Pain Scale: Hurts a little bit Pain Location: Diffuse Pain Intervention(s): Limited activity within patient's tolerance;Monitored during session;Repositioned  Home Living                                          Prior Functioning/Environment              Frequency  Min 2X/week        Progress Toward Goals  OT Goals(current goals can now be found in the care plan section)  Progress towards OT goals: Not progressing toward goals - comment (See Clinical impression.)     Plan      Co-evaluation                 AM-PAC OT "6 Clicks" Daily Activity     Outcome Measure   Help from another person eating meals?: None Help from another person taking care of personal grooming?: A Little Help from another person toileting, which includes using toliet, bedpan, or urinal?: A Lot Help from another person bathing (including washing, rinsing, drying)?: A Little Help from another person to put on and taking off regular upper body clothing?: A Little Help from another person to put on and taking off regular lower body clothing?: A Lot 6 Click Score: 17    End of Session Equipment Utilized During Treatment: Gait belt;Rolling walker  OT Visit Diagnosis: Unsteadiness on feet (R26.81);Muscle weakness (generalized) (M62.81);Pain   Activity Tolerance Patient tolerated treatment well   Patient Left in chair;with call bell/phone within reach   Nurse Communication   (Assisted with pure wick)        Time: 6644-0347 OT Time Calculation (min): 34 min  Charges: OT General Charges $OT Visit: 1 Visit OT Treatments $Self Care/Home Management : 8-22 mins $Therapeutic Activity: 8-22 mins  Anderson Malta, OT Acute Rehab Services Office: [phone] 07/09/2020   Julien Girt 07/09/2020, 2:44 PM

## 2020-07-09 NOTE — Progress Notes (Addendum)
PROGRESS NOTE    Candace Griffin  OVF:[id number] DOB: [birth year] DOA: 06/26/2020 PCP: Richarda Osmond, DO    Brief Narrative:  44 year old female with a past medical history for renal failure status post kidney transplant, newly diagnosed metastatic carcinoma, likely renal in origin. Presented with 4 days of intractable nausea, vomiting,abdominal pain, not able to tolerate liquids or oral medications. Patient was initially placed on broad spectrum antibiotic therapy and received volume resuscitation.   Tacrolimus level was sent to [facility] results are still pending.  Should be available tomorrow. Medical oncology has been following.  Main issue has been her persistent nausea vomiting and abdominal pain with resultant extreme poor oral intake.    Assessment & Plan:  Abdominal pain with nausea and vomiting Suspect that her abdominal pain and vomiting is likely due to the metastatic disease noted in the abdomen.  Patient continues to have nausea though seems to be less frequent compared to 2 days ago.  She remains on scheduled Reglan.  Was started on Marinol by medical oncology.  Also remains on scopolamine patch and other as needed medications. Abdominal pain seems to be reasonably well controlled.  Dose of OxyContin was increased 3 days ago.  Continue current dose for now. Abdominal x-ray done yesterday did not show any obstruction or ileus.  Bilateral thigh pain Patient mentions that she is notices pain in both her thigh areas for the last few days.  She did not mention it to me till today.  She does have peripheral pulses.  No obvious erythema is noted.  We will proceed with Doppler studies.  Also check CK ESR CRP.  Acute kidney injury likely prerenal/anion gap metabolic acidosis/lactic acidosis/electrolyte imbalances Baseline creatinine is around 1.0.  Presented with a creatinine of 1.42 with a BUN of 38.  She was given IV fluids with improvement.   Due to the nausea vomiting  patient again became dehydrated.  Creatinine went up to 1.23 on 11/14.  She was given IV fluids with improvement in renal function.  Creatinine noted to be slightly higher today.  She is making urine.  Recheck labs tomorrow.  Continue IV fluids.  Elevated liver enzymes/metastatic carcinoma likely renal primary/drug induced hepatitis. US liver with no gallbladder stone or sonographic signs of acute cholecystitis. 1.8x1.5x1.5 cm hypoechoic lesion left lobe of the liver. Acute viral hepatitis negative. Acetaminophen level 10. LFTs have improved.  Bilirubin is stable. Concern for metastatic disease on imaging studies.  She was started back on the cabozantinib.  Severe systemic inflammatory response syndrome(sepsisruled out). Reason for her presentation is still not entirely clear.  Her lipase level was normal.  LFTs have been improving.  Continue with PPI and sucralfate.    Status post kidney transplant Continue with prednisone,mycophenolate and tacrolimus. Previous rounding MD spoke with Dr. Aquilla Hacker from renal transplant at [facility], plan to continue current immunosuppressive regimen for now. Tacrolimus level sent on 11/10 noted to be 10.4. Reference range is between 2 and 20. Discussed with Dr. Altamease Oiler.  No changes recommended to her current immunosuppressant regimen.  Medical oncology, Dr. Irene Limbo, has also been in touch with Dr. Sharla Kidney.  Anxiety Continue with as neededlorazepam.  Chronic anemia normocytic likely malignancy related/leukocytosis She was transfused on 11/13 for hemoglobin of 7.4.  Hemoglobin improved.  Recheck tomorrow.  WBC is elevated but stable.    Sinus tachycardia Elevated heart rate most likely due to hypovolemia pain issues.  EKG shows sinus tachycardia.  TSH was noted to be normal on 11/3.  T4 was noted to be 1.45 which is slightly on the higher side.  Thyroid function tests were repeated on 11/14 and TSH noted to be 3.08 and free T4 noted to be 1.14.   Echocardiogram was also done which does not show any significant abnormalities.   Continue low dose metoprolol.  DVT prophylaxis: Enoxaparin   Code Status: Full code Family Communication:  No family at the bedside, she has been communicating with her family over the phone.   Disposition: Continue to mobilize as tolerated.  She lives at home with her children.  Her mother and brother are able to assist her.  Hopefully she will be able to get back home when improved.  Status is: Inpatient  Remains inpatient appropriate because:Ongoing active pain requiring inpatient pain management, IV treatments appropriate due to intensity of illness or inability to take PO and Inpatient level of care appropriate due to severity of illness   Dispo: The patient is from: Home              Anticipated d/c is to: Home              Anticipated d/c date is: > 3 days              Patient currently is not medically stable to d/c.     Consultants:   Oncology     Subjective: Had one episode of vomiting yesterday evening.  None since then.  Abdominal pain is better.  Complains of pain in both her thigh area.  Denies any falls or injuries.    Objective: Vitals:   07/08/20 1344 07/08/20 1701 07/08/20 2250 07/09/20 0503  BP: (!) 129/98 (!) 140/98 (!) 129/99 (!) 145/97  Pulse: (!) 121 (!) 121 (!) 117 (!) 115  Resp: 20 (!) 21 (!) 22 20  Temp: 97.9 F (36.6 C) 98 F (36.7 C) 98 F (36.7 C) 97.9 F (36.6 C)  TempSrc:   Oral Oral  SpO2: 100% 99% 98% 97%  Weight:      Height:        Intake/Output Summary (Last 24 hours) at 07/09/2020 1031 Last data filed at 07/09/2020 0400 Gross per 24 hour  Intake 1463.63 ml  Output 250 ml  Net 1213.63 ml   Filed Weights   06/26/20 1815  Weight: 57.3 kg    Examination:   General appearance: Awake alert.  In no distress Resp: Clear to auscultation bilaterally.  Normal effort Cardio: S1-S2 is normal regular.  No S3-S4.  No rubs murmurs or bruit GI: Abdomen  is soft.  Less tender today.  No rebound rigidity or guarding.  No masses organomegaly.  Not distended.  Extremities: No obvious abnormality noted in either of the lower extremities.  She does have peripheral pulses present. Neurologic: Alert and oriented x3.  No focal neurological deficits.        Data Reviewed: I have personally reviewed following labs and imaging studies  CBC: Recent Labs  Lab 07/03/20 0401 07/03/20 0401 07/04/20 0429 07/05/20 0348 07/06/20 0330 07/07/20 0415 07/08/20 0419  WBC 15.9*   < > 14.8* 14.1* 17.6* 14.0* 16.0*  NEUTROABS 12.7*  --   --   --   --   --   --   HGB 8.3*   < > 8.2* 7.4* 8.0* 8.7* 8.1*  HCT 25.9*   < > 26.0* 23.5* 25.5* 27.0* 25.3*  MCV 82.2   < > 83.1 82.2 82.3 83.6 84.3  PLT 189   < >  202 203 251 198 154   < > = values in this interval not displayed.   Basic Metabolic Panel: Recent Labs  Lab 07/05/20 0348 07/06/20 0330 07/07/20 0415 07/08/20 0419 07/09/20 0350  NA 138 136 137 137 137  K 4.3 4.3 4.5 4.1 4.3  CL 107 104 106 106 107  CO2 21* 18* '22 22 22  ' GLUCOSE 84 115* 117* 130* 144*  BUN '15 18 17 15 13  ' CREATININE 0.95 1.06* 1.23* 1.07* 1.16*  CALCIUM 8.2* 8.3* 8.1* 7.8* 8.0*  MG  --   --  1.2*  --  1.9   GFR: Estimated Creatinine Clearance: 56.6 mL/min (A) (by C-G formula based on SCr of 1.16 mg/dL (H)). Liver Function Tests: Recent Labs  Lab 07/04/20 0429 07/05/20 0348 07/06/20 0330 07/07/20 0415 07/08/20 0419  AST 57* 57* 59* 52* 46*  ALT 97* 77* 69* 53* 44  ALKPHOS 287* 280* 307* 272* 230*  BILITOT 4.1* 4.2* 4.9* 4.8* 4.3*  PROT 5.7* 5.6* 6.3* 5.5* 5.1*  ALBUMIN 2.0* 1.9* 2.2* 1.9* 1.7*      Scheduled Meds: . cabozantinib  20 mg Oral Daily  . dronabinol  2.5 mg Oral BID AC  . enoxaparin (LOVENOX) injection  40 mg Subcutaneous QHS  . mouth rinse  15 mL Mouth Rinse BID  . metoCLOPramide (REGLAN) injection  10 mg Intravenous Q6H  . metoprolol tartrate  12.5 mg Oral BID  . mycophenolate  500 mg Oral  BID  . oxyCODONE  20 mg Oral BID  . pantoprazole  40 mg Oral BID  . predniSONE  5 mg Oral Q breakfast  . promethazine  12.5 mg Intravenous TID AC  . scopolamine  1 patch Transdermal Q72H  . sodium chloride flush  10-40 mL Intracatheter Q12H  . sucralfate  1 g Oral TID WC & HS  . Tacrolimus ER  4 mg Oral Daily   Continuous Infusions: . dextrose 5% lactated ringers 100 mL/hr at 07/09/20 0609     LOS: 13 days    Bonnielee Haff, MD  [facility] Pager: On Amion

## 2020-07-10 ENCOUNTER — Inpatient Hospital Stay (HOSPITAL_COMMUNITY): Payer: Medicare Other

## 2020-07-10 DIAGNOSIS — N179 Acute kidney failure, unspecified: Secondary | ICD-10-CM | POA: Diagnosis not present

## 2020-07-10 DIAGNOSIS — K219 Gastro-esophageal reflux disease without esophagitis: Secondary | ICD-10-CM | POA: Diagnosis not present

## 2020-07-10 DIAGNOSIS — K859 Acute pancreatitis without necrosis or infection, unspecified: Secondary | ICD-10-CM | POA: Diagnosis not present

## 2020-07-10 DIAGNOSIS — R1084 Generalized abdominal pain: Secondary | ICD-10-CM | POA: Diagnosis not present

## 2020-07-10 LAB — COMPREHENSIVE METABOLIC PANEL
ALT: 31 U/L (ref 0–44)
AST: 50 U/L — ABNORMAL HIGH (ref 15–41)
Albumin: 1.7 g/dL — ABNORMAL LOW (ref 3.5–5.0)
Alkaline Phosphatase: 208 U/L — ABNORMAL HIGH (ref 38–126)
Anion gap: 11 (ref 5–15)
BUN: 15 mg/dL (ref 6–20)
CO2: 18 mmol/L — ABNORMAL LOW (ref 22–32)
Calcium: 7.7 mg/dL — ABNORMAL LOW (ref 8.9–10.3)
Chloride: 106 mmol/L (ref 98–111)
Creatinine, Ser: 1.11 mg/dL — ABNORMAL HIGH (ref 0.44–1.00)
GFR, Estimated: >60 mL/min (ref >60)
Glucose, Bld: 145 mg/dL — ABNORMAL HIGH (ref 70–99)
Potassium: 3.7 mmol/L (ref 3.5–5.1)
Sodium: 135 mmol/L (ref 135–145)
Total Bilirubin: 4.9 mg/dL — ABNORMAL HIGH (ref 0.3–1.2)
Total Protein: 5.1 g/dL — ABNORMAL LOW (ref 6.5–8.1)

## 2020-07-10 LAB — URINALYSIS, ROUTINE W REFLEX MICROSCOPIC
Glucose, UA: NEGATIVE mg/dL
Hgb urine dipstick: NEGATIVE
Ketones, ur: NEGATIVE mg/dL
Leukocytes,Ua: NEGATIVE
Nitrite: NEGATIVE
Protein, ur: 30 mg/dL — AB
Specific Gravity, Urine: 1.016 (ref 1.005–1.030)
pH: 6 (ref 5.0–8.0)

## 2020-07-10 LAB — BASIC METABOLIC PANEL
Anion gap: 8 (ref 5–15)
BUN: 12 mg/dL (ref 6–20)
CO2: 21 mmol/L — ABNORMAL LOW (ref 22–32)
Calcium: 8.1 mg/dL — ABNORMAL LOW (ref 8.9–10.3)
Chloride: 106 mmol/L (ref 98–111)
Creatinine, Ser: 1.2 mg/dL — ABNORMAL HIGH (ref 0.44–1.00)
GFR, Estimated: 58 mL/min — ABNORMAL LOW (ref >60)
Glucose, Bld: 148 mg/dL — ABNORMAL HIGH (ref 70–99)
Potassium: 4.3 mmol/L (ref 3.5–5.1)
Sodium: 135 mmol/L (ref 135–145)

## 2020-07-10 LAB — CBC
HCT: 25.4 % — ABNORMAL LOW (ref 36.0–46.0)
Hemoglobin: 8.1 g/dL — ABNORMAL LOW (ref 12.0–15.0)
MCH: 26.9 pg (ref 26.0–34.0)
MCHC: 31.9 g/dL (ref 30.0–36.0)
MCV: 84.4 fL (ref 80.0–100.0)
Platelets: 138 10*3/uL — ABNORMAL LOW (ref 150–400)
RBC: 3.01 MIL/uL — ABNORMAL LOW (ref 3.87–5.11)
RDW: 24.6 % — ABNORMAL HIGH (ref 11.5–15.5)
WBC: 15.8 10*3/uL — ABNORMAL HIGH (ref 4.0–10.5)
nRBC: 0.1 % (ref 0.0–0.2)

## 2020-07-10 LAB — CBC WITH DIFFERENTIAL/PLATELET
Abs Immature Granulocytes: 0.33 10*3/uL — ABNORMAL HIGH (ref 0.00–0.07)
Basophils Absolute: 0 10*3/uL (ref 0.0–0.1)
Basophils Relative: 0 %
Eosinophils Absolute: 0 10*3/uL (ref 0.0–0.5)
Eosinophils Relative: 0 %
HCT: 23.3 % — ABNORMAL LOW (ref 36.0–46.0)
Hemoglobin: 7.7 g/dL — ABNORMAL LOW (ref 12.0–15.0)
Immature Granulocytes: 2 %
Lymphocytes Relative: 7 %
Lymphs Abs: 1.2 10*3/uL (ref 0.7–4.0)
MCH: 27.9 pg (ref 26.0–34.0)
MCHC: 33 g/dL (ref 30.0–36.0)
MCV: 84.4 fL (ref 80.0–100.0)
Monocytes Absolute: 1.4 10*3/uL — ABNORMAL HIGH (ref 0.1–1.0)
Monocytes Relative: 9 %
Neutro Abs: 13 10*3/uL — ABNORMAL HIGH (ref 1.7–7.7)
Neutrophils Relative %: 82 %
Platelets: 130 10*3/uL — ABNORMAL LOW (ref 150–400)
RBC: 2.76 MIL/uL — ABNORMAL LOW (ref 3.87–5.11)
RDW: 24.2 % — ABNORMAL HIGH (ref 11.5–15.5)
WBC: 15.9 10*3/uL — ABNORMAL HIGH (ref 4.0–10.5)
nRBC: 0.2 % (ref 0.0–0.2)

## 2020-07-10 LAB — SEDIMENTATION RATE: Sed Rate: 118 mm/hr — ABNORMAL HIGH (ref 0–22)

## 2020-07-10 LAB — CK: Total CK: 14 U/L — ABNORMAL LOW (ref 38–234)

## 2020-07-10 LAB — AMMONIA: Ammonia: 24 umol/L (ref 9–35)

## 2020-07-10 LAB — PHOSPHORUS: Phosphorus: 2.3 mg/dL — ABNORMAL LOW (ref 2.5–4.6)

## 2020-07-10 LAB — MAGNESIUM: Magnesium: 1.3 mg/dL — ABNORMAL LOW (ref 1.7–2.4)

## 2020-07-10 LAB — C-REACTIVE PROTEIN: CRP: 29.1 mg/dL — ABNORMAL HIGH (ref <1.0)

## 2020-07-10 IMAGING — DX DG CHEST 1V PORT
1 series · 1 of 1 positions shown · non-contrast
Comparison: [DATE]

CLINICAL DATA: Liver carcinoma.  Hypertension.

EXAM:
PORTABLE CHEST 1 VIEW

[chest ap]
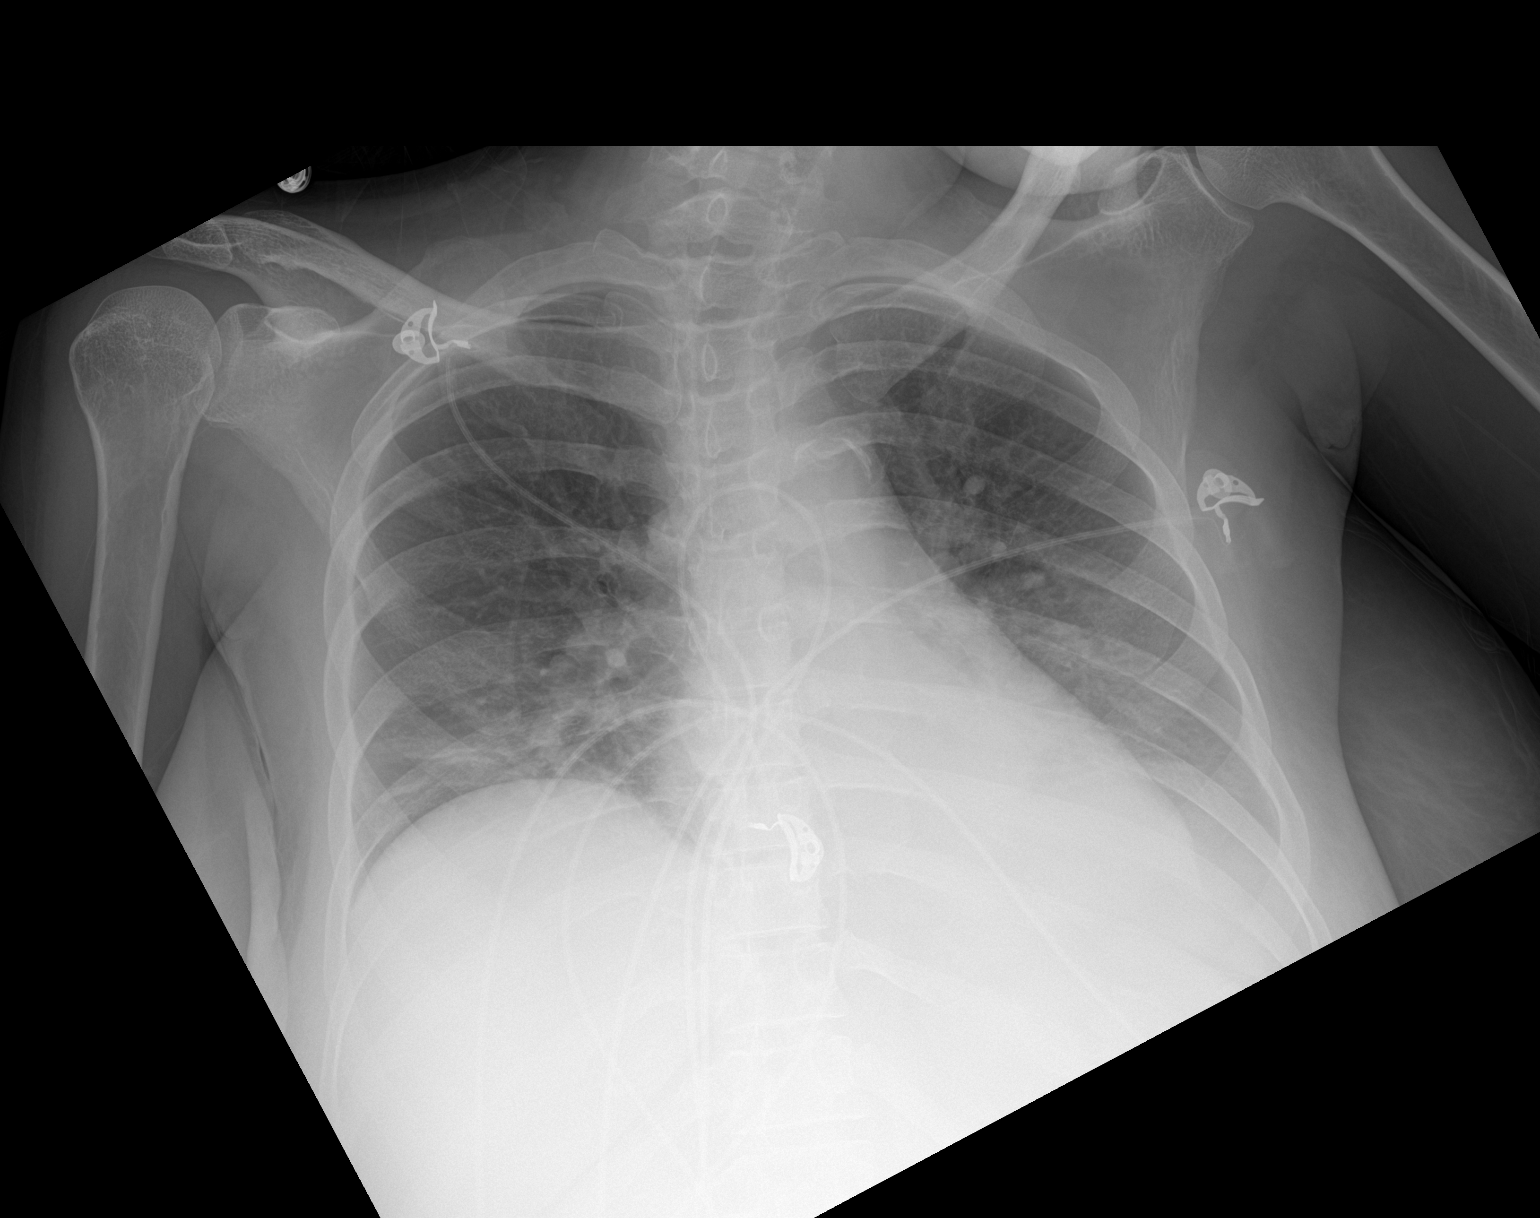

[1 of 1 positions shown; findings below may reference images not displayed]

FINDINGS: There is consolidation in the left lower lobe with small left
pleural effusion. There is ill-defined airspace opacity in the right
base. Heart size and pulmonary vascularity are normal. No
adenopathy. There is aortic atherosclerosis. No bone lesions.
IMPRESSION: Consolidation left lower lobe with small left pleural effusion.
Patchy infiltrate right base. Heart size within normal limits. No
adenopathy evident.

Aortic Atherosclerosis ([59]-[59]).

## 2020-07-10 IMAGING — CT CT HEAD W/O CM
4 series · 16 of 47 positions shown, 18 images · non-contrast
Comparison: MRI [DATE]

CLINICAL DATA: Delirium, history of urologic malignancy.

EXAM:
CT HEAD WITHOUT CONTRAST
TECHNIQUE: Contiguous axial images were obtained from the base of the skull
through the vertex without intravenous contrast.

[Series 2: head wo · axial · 0.41mm/px · z∈[-206,-86]mm · 7 of 32 slices shown, 9 images]
[im 4/32  brain]
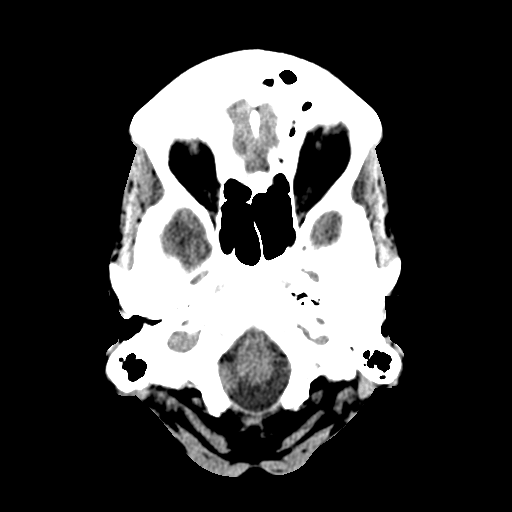
[im 4/32  bone]
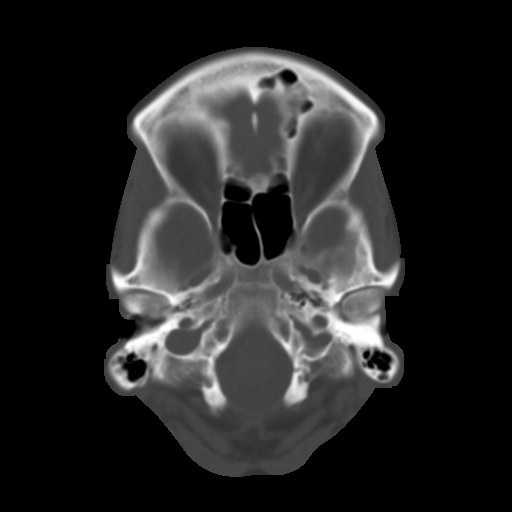
[im 8/32  brain]
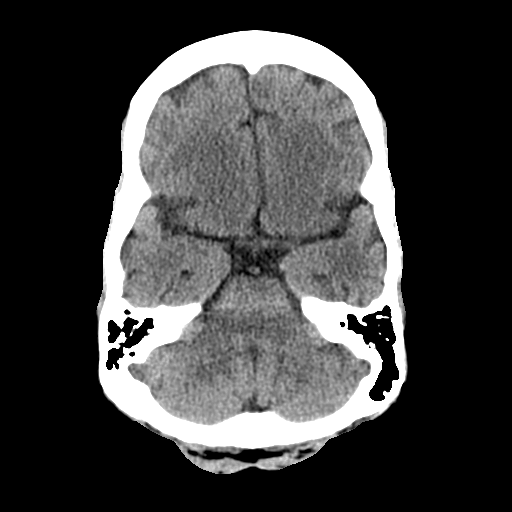
[im 12/32  brain]
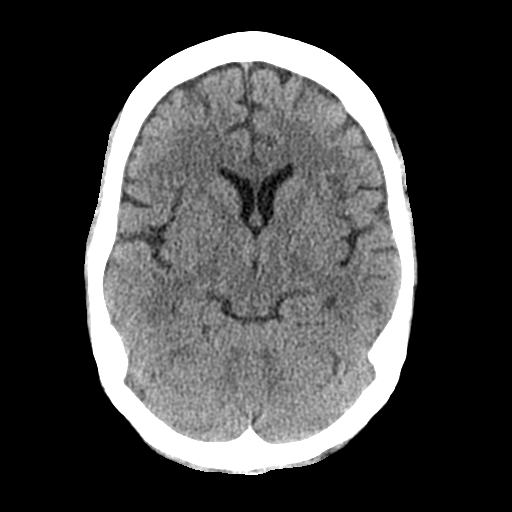
[im 16/32  brain]
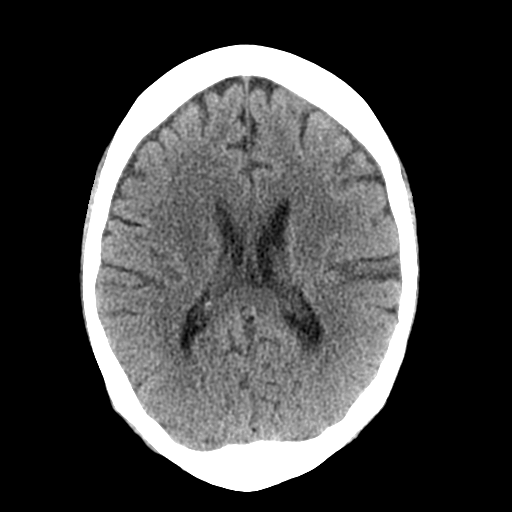
[im 20/32  brain]
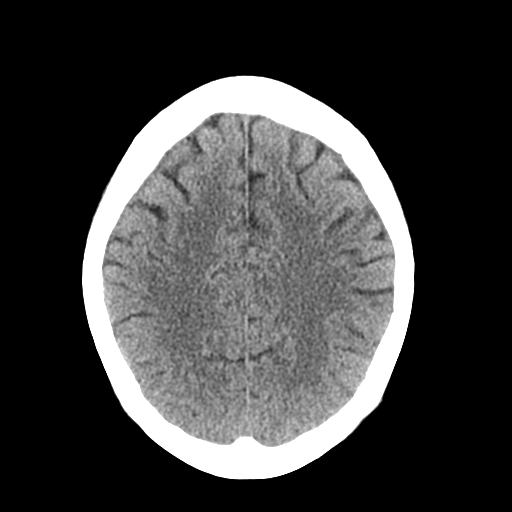
[im 20/32  bone]
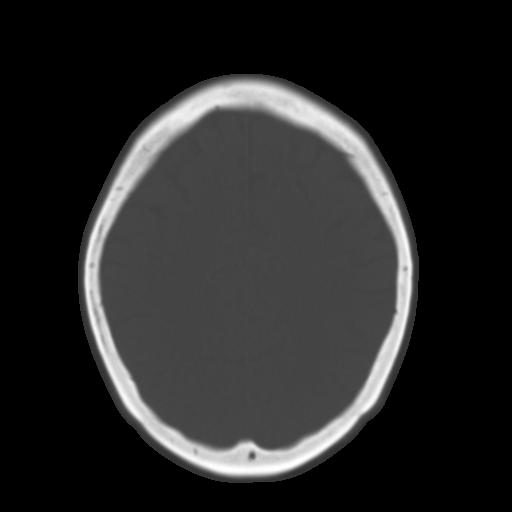
[im 24/32  brain]
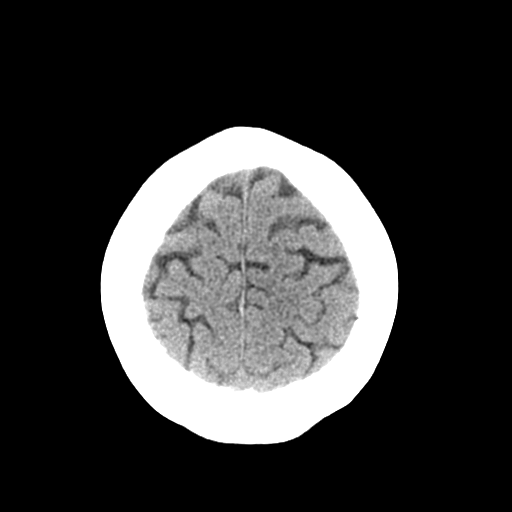
[im 28/32  brain]
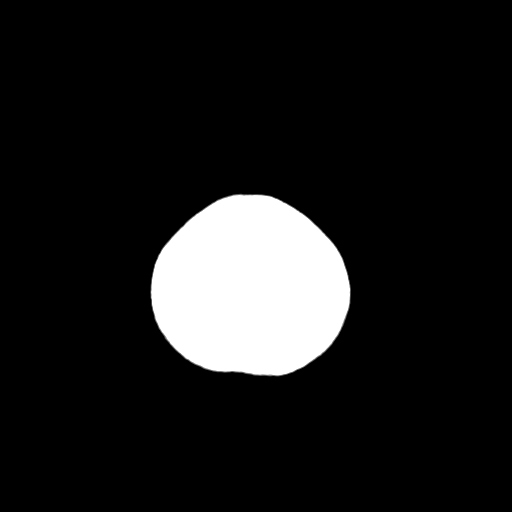

[Series 3: head bone · axial · 0.41mm/px · z∈[-206,-174]mm · 3 of 79 slices shown]
[im 8/79  bone]
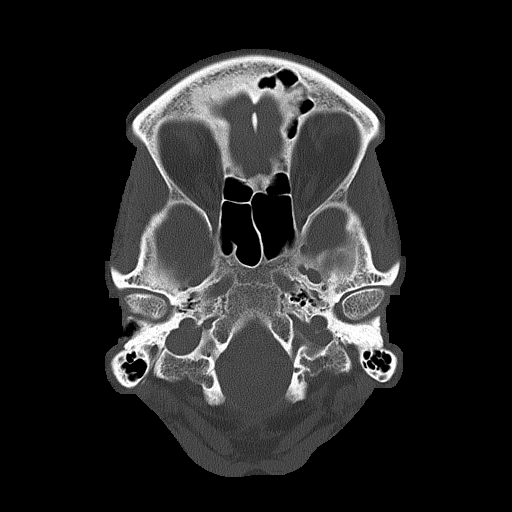
[im 16/79  bone]
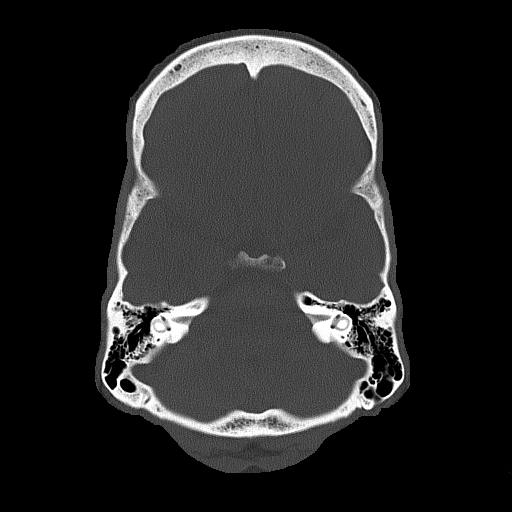
[im 24/79  bone]
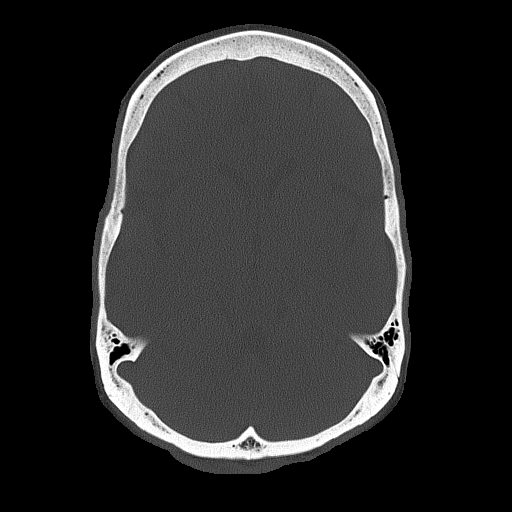

[Series 5: coronal soft tissue · coronal · 0.29mm/px · 3 of 67 slices shown]
[im 23/67  brain]
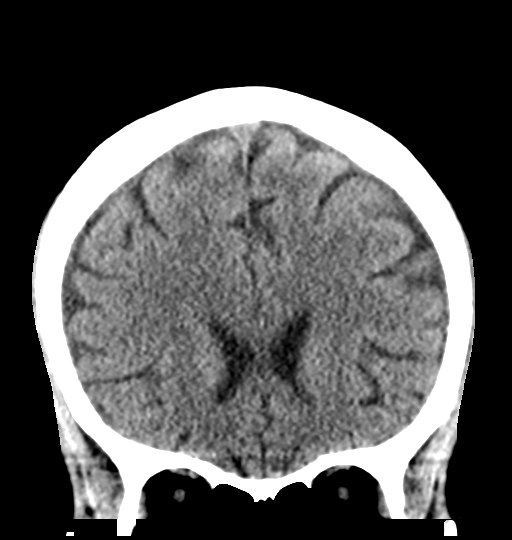
[im 30/67  brain]
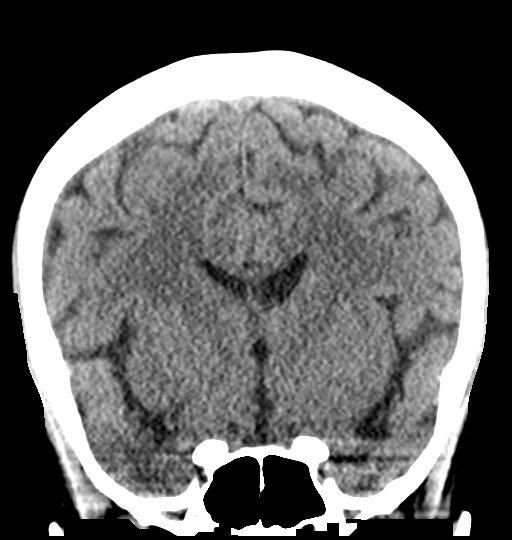
[im 37/67  brain]
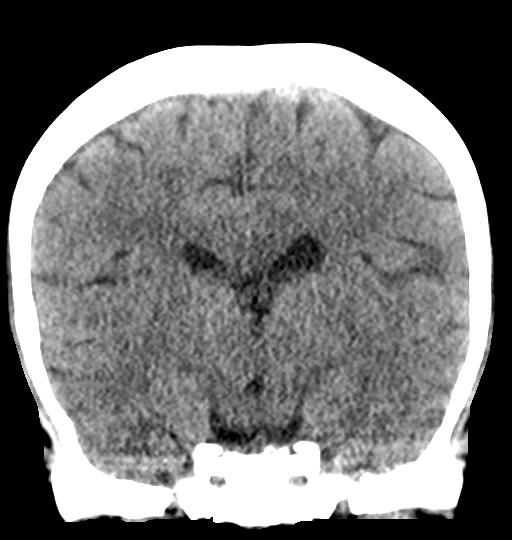

[Series 6: sagittal soft tissue · sagittal · 0.30mm/px · 3 of 50 slices shown]
[im 17/50  brain]
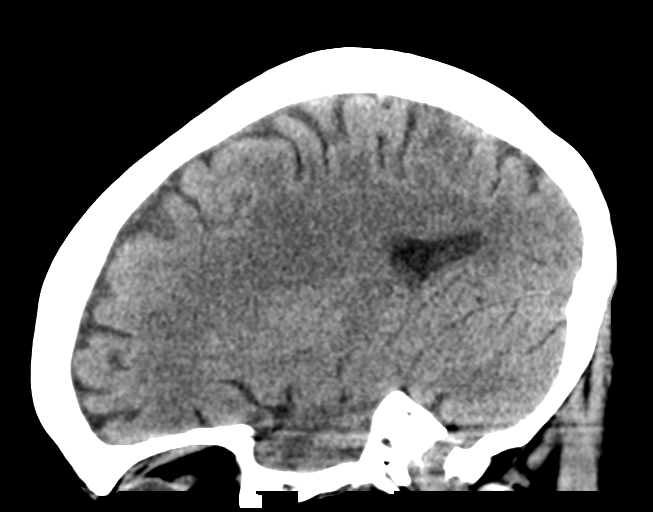
[im 25/50  brain]
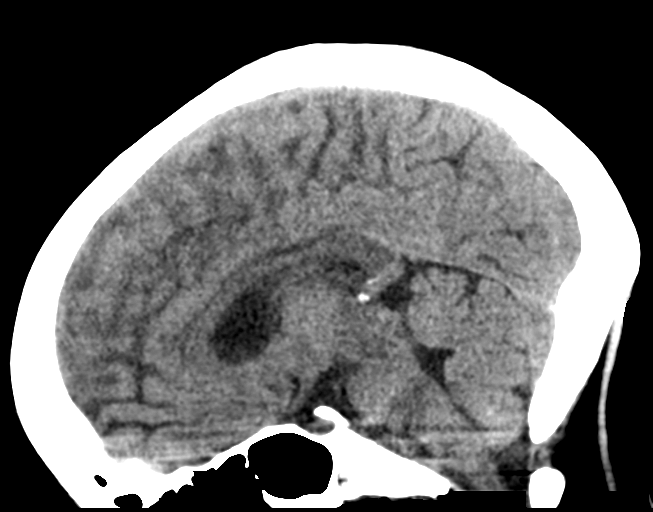
[im 33/50  brain]
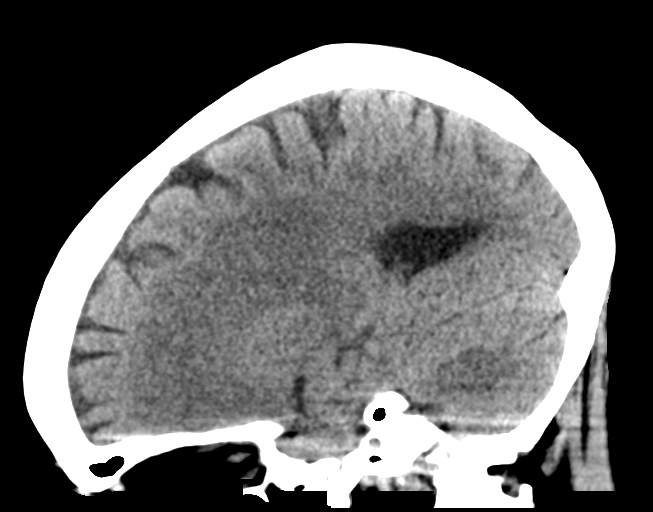

[16 of 47 positions shown; findings below may reference images not displayed]

FINDINGS: Brain: No evidence of acute infarction, hemorrhage, hydrocephalus,
extra-axial collection, mass effect or visible mass lesion 4 within
the limitations of an unenhanced CT exam. Basal cisterns are patent.
Midline intracranial structures are normal. Cerebellar tonsils are
normally positioned.

Vascular: No hyperdense vessel or unexpected calcification.

Skull: Few prominent venous lakes. No convincing focal sclerotic or
lytic osseous metastatic disease. A previously seen C2 vertebral
body metastasis is not within the margins of imaging. Scalp soft
tissues are unremarkable.

Sinuses/Orbits: Paranasal sinuses and mastoid air cells are
predominantly clear. Included orbital structures are unremarkable.

Other: None
IMPRESSION: 1. No acute intracranial findings. No visible metastatic lesions
within the limitations of unenhanced CT imaging.
2. A previously seen C2 vertebral body metastasis is not within the
margins of imaging.

## 2020-07-10 MED ORDER — SODIUM CHLORIDE 0.9 % IV SOLN
500.0000 mg | INTRAVENOUS | Status: DC
  Administered 2020-07-10 – 2020-07-11 (×2): 500 mg via INTRAVENOUS
  Filled 2020-07-10 (×2): qty 500

## 2020-07-10 MED ORDER — POTASSIUM PHOSPHATES 15 MMOLE/5ML IV SOLN
10.0000 mmol | Freq: Once | INTRAVENOUS | Status: AC
  Administered 2020-07-11: 10 mmol via INTRAVENOUS
  Filled 2020-07-10: qty 3.33

## 2020-07-10 MED ORDER — MAGNESIUM SULFATE 4 GM/100ML IV SOLN
4.0000 g | Freq: Once | INTRAVENOUS | Status: AC
  Administered 2020-07-10: 4 g via INTRAVENOUS
  Filled 2020-07-10: qty 100

## 2020-07-10 MED ORDER — SODIUM CHLORIDE 0.9 % IV BOLUS
500.0000 mL | Freq: Once | INTRAVENOUS | Status: AC
  Administered 2020-07-10: 500 mL via INTRAVENOUS

## 2020-07-10 MED ORDER — METOPROLOL TARTRATE 5 MG/5ML IV SOLN
5.0000 mg | Freq: Three times a day (TID) | INTRAVENOUS | Status: DC
  Administered 2020-07-10 – 2020-07-12 (×6): 5 mg via INTRAVENOUS
  Filled 2020-07-10 (×6): qty 5

## 2020-07-10 MED ORDER — LORAZEPAM 2 MG/ML IJ SOLN
1.0000 mg | Freq: Once | INTRAMUSCULAR | Status: AC
  Administered 2020-07-10: 1 mg via INTRAVENOUS
  Filled 2020-07-10: qty 1

## 2020-07-10 MED ORDER — METOPROLOL TARTRATE 25 MG PO TABS: 25.0000 mg | ORAL_TABLET | Freq: Two times a day (BID) | ORAL | Status: DC

## 2020-07-10 MED ORDER — SODIUM CHLORIDE 0.9 % IV SOLN
2.0000 g | INTRAVENOUS | Status: DC
  Administered 2020-07-10 – 2020-07-11 (×2): 2 g via INTRAVENOUS
  Filled 2020-07-10 (×2): qty 2
  Filled 2020-07-10: qty 20

## 2020-07-10 NOTE — TOC Progression Note (Addendum)
Transition of Care [facility]) - Progression Note    Patient Details  Name: NEWELL FRATER MRN: [id number] Date of Birth: 1975-11-19  [facility] CM/SW Contact  Mayerli Kirst, Juliann Pulse, RN Phone Number: 07/10/2020, 11:10 AM  Clinical Narrative:  [facility] for [place];[employer] for dme-rw,tub bench to deliver to rm prior d/c. dme has already been delivered/shipped to patient's home.      Expected Discharge Plan: [place] Barriers to Discharge: Continued Medical Work up  Expected Discharge Plan and Services Expected Discharge Plan: [place]   Discharge Planning Services: CM Consult   Living arrangements for the past 2 months: Single Family Home                 DME Arranged: Walker rolling DME Agency: [employer] Date DME Agency Contacted: 07/09/20 Time DME Agency Contacted: 1054 Representative spoke with at DME Agency: referral line [place] Arranged: Disease Management, OT [place] Agency: [place] ([place]) Date HH Agency Contacted: 07/10/20 Time [place]: 1109 Representative spoke with at [place]: [place] ([place]) Interventions    Readmission Risk Interventions Readmission Risk Prevention Plan 07/01/2020  Transportation Screening Complete  Medication Review Press photographer) Complete  HRI or [place] Complete  SW Recovery Care/Counseling Consult Complete  [place] Not Applicable  Some recent data might be hidden

## 2020-07-10 NOTE — Progress Notes (Signed)
PT Cancellation Note  Patient Details Name: Candace Griffin MRN: [id number] DOB: 1975-12-14   Cancelled Treatment:    Reason Eval/Treat Not Completed: Patient declined, no reason specified    Doreatha Massed, PT Acute Rehabilitation  Office: [phone] Pager: [phone]

## 2020-07-10 NOTE — Progress Notes (Addendum)
PROGRESS NOTE    Candace Griffin  PNT:[id number] DOB: 1975-08-29 DOA: 06/26/2020 PCP: Richarda Osmond, DO  Brief Narrative:  The patient is a 44 year old female with a past medical history for renal failure status post kidney transplant, newly diagnosed metastatic carcinoma, likely renal in origin. Presented with 4 days of intractable nausea, vomiting,abdominal pain, not able to tolerate liquids or oral medications. Patient was initially placed on broad spectrum antibiotic therapy and received volume resuscitation.   Tacrolimus level was sent to [facility] results are still pending.  Should be available tomorrow. Medical oncology has been following.  Her Main issue has been her persistent nausea vomiting and abdominal pain with resultant extreme poor oral intake and now she has new onset confusion and a possible Pneumonia  Assessment & Plan:   Principal Problem:   Pancreatitis Active Problems:   GERD   Status post kidney transplant   Hypertension   Symptomatic anemia   Metastatic carcinoma to liver (HCC)   AKI (acute kidney injury) (HCC)   Hepatitis   Generalized abdominal pain   Renal cell carcinoma (HCC)   Non-intractable vomiting  Abdominal pain with nausea and vomiting -Suspect that her abdominal pain and vomiting is likely due to the metastatic disease noted in the abdomen.  -Patient continues to have nausea though -She remains on scheduled Reglan and has not been able to tolerate very much p.o.   -Was started on Marinol by medical oncology but will stop given her new onset confusion.  Also remains on scopolamine patch and other as needed medications. -Abdominal pain seemed to be reasonably well controlled however has now worsened.  - Dose of OxyContin was increased 4 days ago.  Continue current dose for now. -Abdominal x-ray done the day before did not show any obstruction or ileus. -If continues have significant nausea vomiting may need a GI consultation and repeat  abdominal CT scan  Bilateral thigh pain -Patient mentions that she is notices pain in both her thigh areas for the last few days.  She did not mention Dr. Maryland Pink until yesterday.   -She does have peripheral pulses.  No obvious erythema is noted.   -We will proceed with Doppler studies and showed no evidence of any lower extremity DVT -Also check CK ESR CRP; ESR was elevated at 118, CK was 14, CRP was 29.1 -Continue to monitor and trend and could be in the setting of her metastatic cancer  Acute kidney injury likely prerenal/anion gap metabolic acidosis/lactic acidosis/electrolyte imbalances -Baseline creatinine is around 1.0.  Presented with a creatinine of 1.42 with a BUN of 38.  She was given IV fluids with improvement and now will give another dose of normal saline bolus  -Due to the nausea vomiting patient again became dehydrated.  Creatinine went up to 1.23 on 11/14. -She was given IV fluids with improvement in renal function.    Creatinine is slowly trending up but stable from today and BUN/creatinine was 15/1.11 -She does have a mild metabolic acidosis with a CO2 of 18, anion gap of 11, chloride level 106 -Avoid further nephrotoxic medications, contrast dyes, hypotension and renally dose medications -Repeat CMP in a.m.  Hypomagnesemia -Mag Level was 1.3 -Replete with IV Mag Sulfate 3 grams -Continue to Monitor and Replete as Necessary -Repeat Mag Level in the AM   Elevated liver enzymes/metastatic carcinoma likely renal primary/drug induced hepatitis. -US liver with no gallbladder stone or sonographic signs of acute cholecystitis. 1.8x1.5x1.5 cm hypoechoic lesion left lobe of the liver. -Acute viral  hepatitis negative. Acetaminophen level 10. -LFTs have improved.  Bilirubin is stable. -Concern for metastatic disease on imaging studies.   -She was started back on the cabozantinib and dose increased  By Oncology   Severe systemic inflammatory response syndrome(sepsisruled  out). -Reason for her presentation is still not entirely clear.  Her lipase level was normal.   -LFTs have been improving and almost normalized.  Continue with PPI and sucralfate. \ -Likely she has a pneumonia and will be treated as below given her chest x-ray findings   Status post kidney transplant -Continue with prednisone,mycophenolate and tacrolimus. -Previous rounding MD spoke with Dr. Aquilla Hacker from renal transplant at [facility], plan to continue current immunosuppressive regimen for now. Tacrolimus level sent on 11/10 noted to be 10.4. -Reference range is between 2 and 20. -Dr. Gerrie Nordmann Discussed with Dr. Altamease Oiler.  No changes recommended to her current immunosuppressant regimen.  -Medical oncology, Dr. Irene Limbo, has also been in touch with Dr. Sharla Kidney.  Hypophosphatemia -Patient's Phos level was 2.3 -Replete with IV K-Phos 10 mmol -Continue monitor and replete as necessary -Repeat Phos Level in the AM   Anxiety -Continue with as neededlorazepam.  Chronic anemia normocytic likely malignancy related/leukocytosis -She was transfused on 11/13 for hemoglobin of 7.4.   Hemoglobin has remained relatively stable is now 8.1/25.4 -Leukocytosis is still elevated but could be in the setting of her prednisone versus a possible pneumonia -continue to monitor for signs and symptoms of bleeding; no overt bleeding noted Repeat CBC in a.m.  Sinus tachycardia -Elevated heart rate most likely due to hypovolemia and because of pain issues.  EKG shows sinus tachycardia.   -TSH was noted to be normal on 11/3.  T4 was noted to be 1.45 which is slightly on the higher side. -Thyroid function tests were repeated on 11/14 and TSH noted to be 3.08 and free T4 noted to be 1.14. -Echocardiogram was also done which does not show any significant abnormalities.   -Continue low dose metoprolol but she had not been getting this due to her nausea and vomiting so this was increased and then changed IV 5 mg every 8  hours scheduled.  Confusion, new onset -Patient has been having periods of lucidity but then confabulates and goes off tangent -Check an ammonia level, head CT scan and place on delirium precautions; ammonia level was normal at 24 -Repeat blood work currently pending -Question if this is related to infection and will discontinue the Marinol given oncology recommendations  -MRI of the brain was ordered and was negative for metastatic disease of the brain but did show a 1 cm enhancing lesion at the C2 vertebral body compatible with metastatic disease -Medical oncology following closely  HCAP/pneumonia, not present on admission -Repeat chest x-ray today showed "There is consolidation in the left lower lobe with small left pleural effusion. There is ill-defined airspace opacity in the right base. Heart size and pulmonary vascularity are normal. No adenopathy. There is aortic atherosclerosis. No bone lesions." -We will start on CAP coverage with IV ceftriaxone and IV azithromycin -May add Breathing Tx if Necessary -Repeat CXR intermittently   DVT prophylaxis: SCDs Code Status: FULL CODE Family Communication: No family present at bedside  Disposition Plan: Pending further clinical improvement   Status is: Inpatient  Remains inpatient appropriate because:Unsafe d/c plan, IV treatments appropriate due to intensity of illness or inability to take PO and Inpatient level of care appropriate due to severity of illness  Dispo:  Patient From: Home  Planned Disposition: Home  with [facility] Care [facility]  Expected discharge date: 07/12/20  Medically stable for discharge: No  Consultants:   Medical Oncology   Procedures: None   Antimicrobials:  Anti-infectives (From admission, onward)   Start     Dose/Rate Route Frequency Ordered Stop   07/10/20 2100  azithromycin (ZITHROMAX) 500 mg in sodium chloride 0.9 % 250 mL IVPB        500 mg 250 mL/hr over 60 Minutes Intravenous Every 24 hours 07/10/20  1800     07/10/20 2000  cefTRIAXone (ROCEPHIN) 2 g in sodium chloride 0.9 % 100 mL IVPB        2 g 200 mL/hr over 30 Minutes Intravenous Every 24 hours 07/10/20 1800     06/28/20 1600  piperacillin-tazobactam (ZOSYN) IVPB 3.375 g  Status:  Discontinued        3.375 g 12.5 mL/hr over 240 Minutes Intravenous Every 8 hours 06/28/20 1508 07/02/20 0807   06/27/20 1200  ceFEPIme (MAXIPIME) 2 g in sodium chloride 0.9 % 100 mL IVPB  Status:  Discontinued        2 g 200 mL/hr over 30 Minutes Intravenous Every 8 hours 06/27/20 0821 06/27/20 0901   06/27/20 0500  vancomycin (VANCOREADY) IVPB 500 mg/100 mL  Status:  Discontinued        500 mg 100 mL/hr over 60 Minutes Intravenous Every 12 hours 06/26/20 1830 06/27/20 0901   06/27/20 0300  ceFEPIme (MAXIPIME) 2 g in sodium chloride 0.9 % 100 mL IVPB  Status:  Discontinued        2 g 200 mL/hr over 30 Minutes Intravenous Every 12 hours 06/26/20 1830 06/27/20 0821   06/26/20 1400  ceFEPIme (MAXIPIME) 2 g in sodium chloride 0.9 % 100 mL IVPB        2 g 200 mL/hr over 30 Minutes Intravenous  Once 06/26/20 1345 06/26/20 1509   06/26/20 1400  metroNIDAZOLE (FLAGYL) IVPB 500 mg        500 mg 100 mL/hr over 60 Minutes Intravenous  Once 06/26/20 1345 06/26/20 1736   06/26/20 1400  vancomycin (VANCOCIN) IVPB 1000 mg/200 mL premix        1,000 mg 200 mL/hr over 60 Minutes Intravenous  Once 06/26/20 1345 06/26/20 1741       Subjective: Seen and examined at bedside and she was still complaining of abdominal pain and nausea vomiting and then later this afternoon became a little confused.  Started confabulating a little bit and started going off topic.  Will obtain a head CT scan and repeat lab work.  No other concerns or complaints at this time but states that her nausea vomiting is about the same as yesterday.  Denies any chest pain no.  No other concerns or complaints at this time.  Objective: Vitals:   07/10/20 0931 07/10/20 1254 07/10/20 1630 07/10/20 1718   BP: (!) 146/106 (!) 132/93 (!) 138/93 (!) 148/113  Pulse: (!) 129 (!) 128 (!) 144 (!) 134  Resp: (!) 24 (!) 24 (!) 32 (!) 25  Temp: 98.1 F (36.7 C) 98.2 F (36.8 C) 98.1 F (36.7 C) 97.7 F (36.5 C)  TempSrc: Oral Oral Oral Oral  SpO2: 100% 100% 100% 100%  Weight:      Height:        Intake/Output Summary (Last 24 hours) at 07/10/2020 1810 Last data filed at 07/10/2020 1753 Gross per 24 hour  Intake 2372.55 ml  Output 640 ml  Net 1732.55 ml   [employer]  06/26/20 1815  Weight: 57.3 kg   Examination: Physical Exam:  Constitutional: WN/WD African-American female currently no acute distress appears calm but slightly uncomfortable Eyes: Lids and conjunctivae normal, sclerae anicteric  ENMT: External Ears, Nose appear normal. Grossly normal hearing.  Neck: Appears normal, supple, no cervical masses, normal ROM, no appreciable thyromegaly, No JVD Respiratory: Diminished to auscultation bilaterally with some rhonchi, no wheezing, rales, or crackles. Normal respiratory effort and patient is not tachypenic. No accessory muscle use.  Unlabored breathing Cardiovascular: RRR, no murmurs / rubs / gallops. S1 and S2 auscultated. Has 1+ LE edema  Abdomen: Soft, non-tender, non-distended.  Bowel sounds positive.  GU: Deferred. Musculoskeletal: No clubbing / cyanosis of digits/nails. No joint deformity upper and lower extremities.  Skin: No rashes, lesions, ulcers on a limited skin evaluation. No induration; Warm and dry.  Neurologic: CN 2-12 grossly intact with no focal deficits. Romberg sign and cerebellar reflexes not assessed.  Psychiatric: Impaired judgment and insight. Alert and oriented x 2. Normal mood and appropriate affect.   Data Reviewed: I have personally reviewed following labs and imaging studies  CBC: Recent Labs  Lab 07/05/20 0348 07/06/20 0330 07/07/20 0415 07/08/20 0419 07/10/20 0541  WBC 14.1* 17.6* 14.0* 16.0* 15.8*  HGB 7.4* 8.0* 8.7* 8.1* 8.1*  HCT  23.5* 25.5* 27.0* 25.3* 25.4*  MCV 82.2 82.3 83.6 84.3 84.4  PLT 203 251 198 154 161*   Basic Metabolic Panel: Recent Labs  Lab 07/06/20 0330 07/07/20 0415 07/08/20 0419 07/09/20 0350 07/10/20 0541  NA 136 137 137 137 135  K 4.3 4.5 4.1 4.3 4.3  CL 104 106 106 107 106  CO2 18* $Remov'22 22 22 'NUAUtQ$ 21*  GLUCOSE 115* 117* 130* 144* 148*  BUN $Re'18 17 15 13 12  'iTW$ CREATININE 1.06* 1.23* 1.07* 1.16* 1.20*  CALCIUM 8.3* 8.1* 7.8* 8.0* 8.1*  MG  --  1.2*  --  1.9  --    GFR: Estimated Creatinine Clearance: 54.7 mL/min (A) (by C-G formula based on SCr of 1.2 mg/dL (H)). Liver Function Tests: Recent Labs  Lab 07/05/20 0348 07/06/20 0330 07/07/20 0415 07/08/20 0419 07/09/20 0350  AST 57* 59* 52* 46* 50*  ALT 77* 69* 53* 44 38  ALKPHOS 280* 307* 272* 230* 205*  BILITOT 4.2* 4.9* 4.8* 4.3* 4.0*  PROT 5.6* 6.3* 5.5* 5.1* 5.4*  ALBUMIN 1.9* 2.2* 1.9* 1.7* 1.8*   No results for input(s): LIPASE, AMYLASE in the last 168 hours. No results for input(s): AMMONIA in the last 168 hours. Coagulation Profile: No results for input(s): INR, PROTIME in the last 168 hours. Cardiac Enzymes: Recent Labs  Lab 07/10/20 0541  CKTOTAL 14*   BNP (last 3 results) No results for input(s): PROBNP in the last 8760 hours. HbA1C: No results for input(s): HGBA1C in the last 72 hours. CBG: No results for input(s): GLUCAP in the last 168 hours. Lipid Profile: No results for input(s): CHOL, HDL, LDLCALC, TRIG, CHOLHDL, LDLDIRECT in the last 72 hours. Thyroid Function Tests: No results for input(s): TSH, T4TOTAL, FREET4, T3FREE, THYROIDAB in the last 72 hours. Anemia Panel: No results for input(s): VITAMINB12, FOLATE, FERRITIN, TIBC, IRON, RETICCTPCT in the last 72 hours. Sepsis Labs: No results for input(s): PROCALCITON, LATICACIDVEN in the last 168 hours.  No results found for this or any previous visit (from the past 240 hour(s)).   RN Pressure Injury Documentation:     Estimated body mass index is 19.79  kg/m as calculated from the following:   Height as of this  encounter: '5\' 7"'$  (1.702 m).   Weight as of this encounter: 57.3 kg.  Malnutrition Type:      Malnutrition Characteristics:      Nutrition Interventions:        Radiology Studies: DG CHEST PORT 1 VIEW  Result Date: 07/10/2020 CLINICAL DATA:  Liver carcinoma.  Hypertension. EXAM: PORTABLE CHEST 1 VIEW COMPARISON:  June 26, 2020 FINDINGS: There is consolidation in the left lower lobe with small left pleural effusion. There is ill-defined airspace opacity in the right base. Heart size and pulmonary vascularity are normal. No adenopathy. There is aortic atherosclerosis. No bone lesions. IMPRESSION: Consolidation left lower lobe with small left pleural effusion. Patchy infiltrate right base. Heart size within normal limits. No adenopathy evident. Aortic Atherosclerosis (ICD10-I70.0). Electronically Signed   By: Lowella Grip III M.D.   On: 07/10/2020 13:48   VAS [place] LOWER EXTREMITY VENOUS (DVT)  Result Date: 07/09/2020  Lower Venous DVT Study Indications: Pain in thighs bilaterally.  Risk Factors: Cancer Metastatic carcinoma to liver, hx of RCC. Comparison Study: No prior studies. Performing Technologist: Darlin Coco, RDMS  Examination Guidelines: A complete evaluation includes B-mode imaging, spectral Doppler, color Doppler, and power Doppler as needed of all accessible portions of each vessel. Bilateral testing is considered an integral part of a complete examination. Limited examinations for reoccurring indications may be performed as noted. The reflux portion of the exam is performed with the patient in reverse Trendelenburg.  +---------+---------------+---------+-----------+----------+--------------+ RIGHT    CompressibilityPhasicitySpontaneityPropertiesThrombus Aging +---------+---------------+---------+-----------+----------+--------------+ CFV      Full           Yes      Yes                                  +---------+---------------+---------+-----------+----------+--------------+ SFJ      Full                                                        +---------+---------------+---------+-----------+----------+--------------+ FV Prox  Full                                                        +---------+---------------+---------+-----------+----------+--------------+ FV Mid   Full                                                        +---------+---------------+---------+-----------+----------+--------------+ FV DistalFull                                                        +---------+---------------+---------+-----------+----------+--------------+ PFV      Full                                                        +---------+---------------+---------+-----------+----------+--------------+  POP      Full           Yes      Yes                                 +---------+---------------+---------+-----------+----------+--------------+ PTV      Full                                                        +---------+---------------+---------+-----------+----------+--------------+ PERO     Full                                                        +---------+---------------+---------+-----------+----------+--------------+   +---------+---------------+---------+-----------+----------+--------------+ LEFT     CompressibilityPhasicitySpontaneityPropertiesThrombus Aging +---------+---------------+---------+-----------+----------+--------------+ CFV      Full           Yes      Yes                                 +---------+---------------+---------+-----------+----------+--------------+ SFJ      Full                                                        +---------+---------------+---------+-----------+----------+--------------+ FV Prox  Full                                                         +---------+---------------+---------+-----------+----------+--------------+ FV Mid   Full                                                        +---------+---------------+---------+-----------+----------+--------------+ FV DistalFull                                                        +---------+---------------+---------+-----------+----------+--------------+ PFV      Full                                                        +---------+---------------+---------+-----------+----------+--------------+ POP      Full           Yes      Yes                                 +---------+---------------+---------+-----------+----------+--------------+  PTV      Full                                                        +---------+---------------+---------+-----------+----------+--------------+ PERO     Full                                                        +---------+---------------+---------+-----------+----------+--------------+     Summary: RIGHT: - There is no evidence of deep vein thrombosis in the lower extremity.  - No cystic structure found in the popliteal fossa. - Ultrasound characteristics of enlarged lymph nodes are noted in the groin.  LEFT: - There is no evidence of deep vein thrombosis in the lower extremity.  - No cystic structure found in the popliteal fossa. - Ultrasound characteristics of enlarged lymph nodes noted in the groin.  *See table(s) above for measurements and observations. Electronically signed by Curt Jews MD on 07/09/2020 at 4:37:48 PM.    Final    Scheduled Meds: . cabozantinib  40 mg Oral Daily  . enoxaparin (LOVENOX) injection  40 mg Subcutaneous QHS  . mouth rinse  15 mL Mouth Rinse BID  . metoCLOPramide (REGLAN) injection  10 mg Intravenous Q6H  . metoprolol tartrate  5 mg Intravenous Q8H  . mycophenolate  500 mg Oral BID  . oxyCODONE  20 mg Oral BID  . pantoprazole  40 mg Oral BID  . predniSONE  5 mg Oral Q breakfast  .  promethazine  12.5 mg Intravenous TID AC  . scopolamine  1 patch Transdermal Q72H  . sodium chloride flush  10-40 mL Intracatheter Q12H  . sucralfate  1 g Oral TID WC & HS  . Tacrolimus ER  4 mg Oral Daily   Continuous Infusions: . azithromycin    . cefTRIAXone (ROCEPHIN)  IV    . dextrose 5% lactated ringers 75 mL/hr at 07/10/20 1645    LOS: 14 days    Kerney Elbe, DO [facility] PAGER is on [place]  If 7PM-7AM, please contact night-coverage [email or web address]

## 2020-07-10 NOTE — Progress Notes (Addendum)
Patient alert and oriented with intermittent delirium. Patient rambling and not making sense to nursing staff at times. Vital signs obtained at this time. Red MEWS. Dr. Alfredia Ferguson made aware. Charge nurse made aware. 500 cc bolus given, urine specimen collected.

## 2020-07-11 ENCOUNTER — Inpatient Hospital Stay (HOSPITAL_COMMUNITY): Payer: Medicare Other

## 2020-07-11 ENCOUNTER — Inpatient Hospital Stay (HOSPITAL_COMMUNITY)
Admission: EM | Admit: 2020-07-11 | Discharge: 2020-07-11 | Disposition: A | Discharge status: Home / Self Care | Payer: Medicare Other | Source: Home / Self Care | Attending: Internal Medicine | Admitting: Internal Medicine

## 2020-07-11 DIAGNOSIS — R4182 Altered mental status, unspecified: Secondary | ICD-10-CM | POA: Diagnosis not present

## 2020-07-11 DIAGNOSIS — E440 Moderate protein-calorie malnutrition: Secondary | ICD-10-CM | POA: Insufficient documentation

## 2020-07-11 DIAGNOSIS — K859 Acute pancreatitis without necrosis or infection, unspecified: Secondary | ICD-10-CM | POA: Diagnosis not present

## 2020-07-11 DIAGNOSIS — C787 Secondary malignant neoplasm of liver and intrahepatic bile duct: Secondary | ICD-10-CM | POA: Diagnosis not present

## 2020-07-11 DIAGNOSIS — C649 Malignant neoplasm of unspecified kidney, except renal pelvis: Secondary | ICD-10-CM | POA: Diagnosis not present

## 2020-07-11 DIAGNOSIS — R1084 Generalized abdominal pain: Secondary | ICD-10-CM | POA: Diagnosis not present

## 2020-07-11 DIAGNOSIS — N179 Acute kidney failure, unspecified: Secondary | ICD-10-CM | POA: Diagnosis not present

## 2020-07-11 DIAGNOSIS — K219 Gastro-esophageal reflux disease without esophagitis: Secondary | ICD-10-CM | POA: Diagnosis not present

## 2020-07-11 LAB — CBC WITH DIFFERENTIAL/PLATELET
Abs Immature Granulocytes: 0.29 K/uL — ABNORMAL HIGH (ref 0.00–0.07)
Basophils Absolute: 0 K/uL (ref 0.0–0.1)
Basophils Relative: 0 %
Eosinophils Absolute: 0 K/uL (ref 0.0–0.5)
Eosinophils Relative: 0 %
HCT: 24.2 % — ABNORMAL LOW (ref 36.0–46.0)
Hemoglobin: 8 g/dL — ABNORMAL LOW (ref 12.0–15.0)
Immature Granulocytes: 1 %
Lymphocytes Relative: 6 %
Lymphs Abs: 1.3 K/uL (ref 0.7–4.0)
MCH: 27.6 pg (ref 26.0–34.0)
MCHC: 33.1 g/dL (ref 30.0–36.0)
MCV: 83.4 fL (ref 80.0–100.0)
Monocytes Absolute: 1.4 K/uL — ABNORMAL HIGH (ref 0.1–1.0)
Monocytes Relative: 7 %
Neutro Abs: 18.4 K/uL — ABNORMAL HIGH (ref 1.7–7.7)
Neutrophils Relative %: 86 %
Platelets: 146 K/uL — ABNORMAL LOW (ref 150–400)
RBC: 2.9 MIL/uL — ABNORMAL LOW (ref 3.87–5.11)
RDW: 24.6 % — ABNORMAL HIGH (ref 11.5–15.5)
WBC: 21.4 K/uL — ABNORMAL HIGH (ref 4.0–10.5)
nRBC: 0.1 % (ref 0.0–0.2)

## 2020-07-11 LAB — COMPREHENSIVE METABOLIC PANEL
ALT: 32 U/L (ref 0–44)
AST: 53 U/L — ABNORMAL HIGH (ref 15–41)
Albumin: 1.9 g/dL — ABNORMAL LOW (ref 3.5–5.0)
Alkaline Phosphatase: 214 U/L — ABNORMAL HIGH (ref 38–126)
Anion gap: 12 (ref 5–15)
BUN: 15 mg/dL (ref 6–20)
CO2: 20 mmol/L — ABNORMAL LOW (ref 22–32)
Calcium: 7.8 mg/dL — ABNORMAL LOW (ref 8.9–10.3)
Chloride: 105 mmol/L (ref 98–111)
Creatinine, Ser: 1.16 mg/dL — ABNORMAL HIGH (ref 0.44–1.00)
GFR, Estimated: 60 mL/min — ABNORMAL LOW (ref >60)
Glucose, Bld: 109 mg/dL — ABNORMAL HIGH (ref 70–99)
Potassium: 4.2 mmol/L (ref 3.5–5.1)
Sodium: 137 mmol/L (ref 135–145)
Total Bilirubin: 5.2 mg/dL — ABNORMAL HIGH (ref 0.3–1.2)
Total Protein: 5.5 g/dL — ABNORMAL LOW (ref 6.5–8.1)

## 2020-07-11 LAB — URINE CULTURE: Culture: NO GROWTH

## 2020-07-11 LAB — MAGNESIUM: Magnesium: 2.6 mg/dL — ABNORMAL HIGH (ref 1.7–2.4)

## 2020-07-11 LAB — PHOSPHORUS: Phosphorus: 2.8 mg/dL (ref 2.5–4.6)

## 2020-07-11 IMAGING — DX DG CHEST 1V PORT
1 series · 1 of 1 positions shown · non-contrast
Comparison: [DATE]

CLINICAL DATA: Shortness of breath

EXAM:
PORTABLE CHEST 1 VIEW

[chest ap]
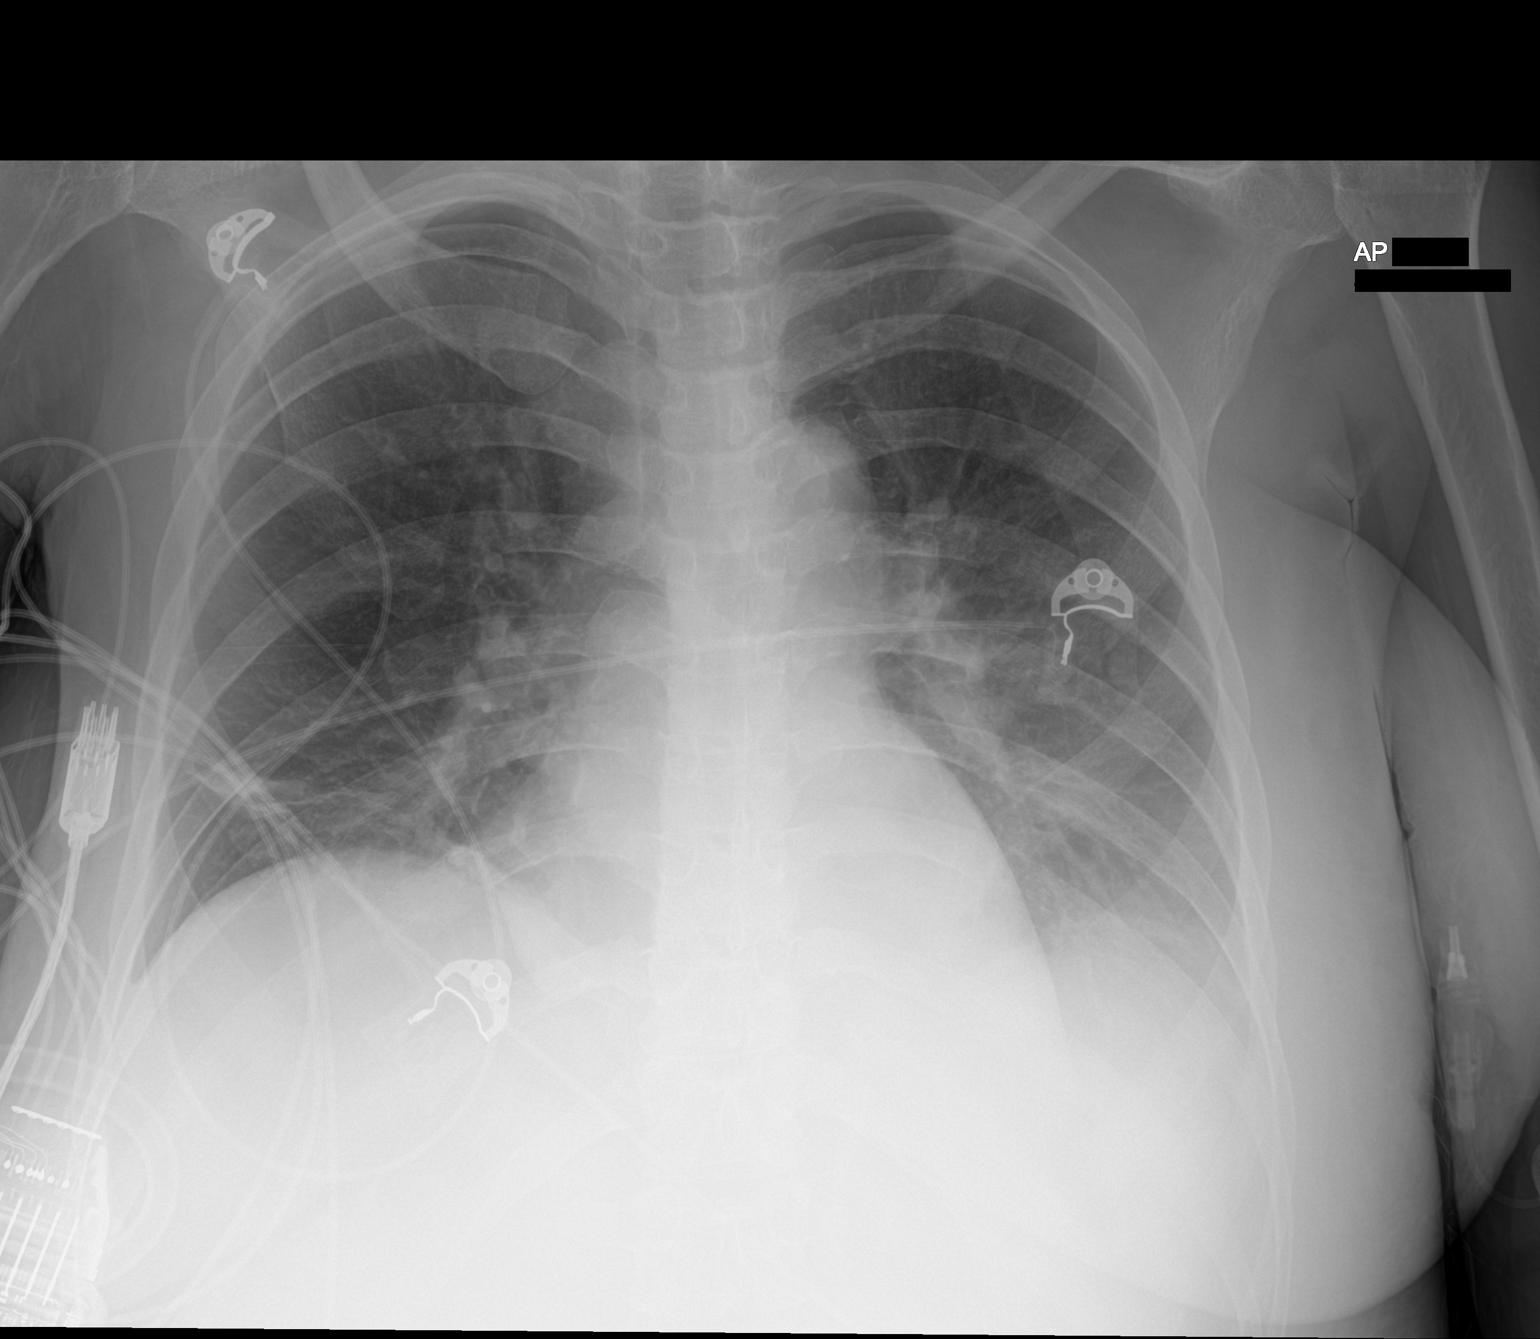

[1 of 1 positions shown; findings below may reference images not displayed]

FINDINGS: Hazy opacity of the left more than right lower chest. No visible
effusion or pneumothorax. Normal heart size and mediastinal
contours.
IMPRESSION: Stable low volume chest with infiltrates or atelectasis at the
bases.

## 2020-07-11 MED ORDER — THIAMINE HCL 100 MG/ML IJ SOLN
500.0000 mg | Freq: Three times a day (TID) | INTRAVENOUS | Status: AC
  Administered 2020-07-11 – 2020-07-14 (×9): 500 mg via INTRAVENOUS
  Filled 2020-07-11 (×10): qty 5

## 2020-07-11 MED ORDER — SODIUM CHLORIDE 0.9 % IV BOLUS
500.0000 mL | Freq: Once | INTRAVENOUS | Status: AC
  Administered 2020-07-11: 500 mL via INTRAVENOUS

## 2020-07-11 MED ORDER — CABOZANTINIB S-MALATE 20 MG PO TABS
20.0000 mg | ORAL_TABLET | Freq: Every day | ORAL | Status: DC
  Administered 2020-07-11: 20 mg via ORAL

## 2020-07-11 MED ORDER — THIAMINE HCL 100 MG/ML IJ SOLN
250.0000 mg | Freq: Three times a day (TID) | INTRAVENOUS | Status: DC
  Administered 2020-07-15 – 2020-07-16 (×3): 250 mg via INTRAVENOUS
  Filled 2020-07-11 (×6): qty 2.5

## 2020-07-11 MED ORDER — CABOZANTINIB S-MALATE 20 MG PO TABS: 20.0000 mg | ORAL_TABLET | Freq: Every day | ORAL | Status: DC

## 2020-07-11 NOTE — Progress Notes (Signed)
PT Cancellation Note  Patient Details Name: [name] MRN: [id number] DOB: March 07, 1976   Cancelled Treatment:     Physical Therapy session cancelled due to medical procedure - insertion of NG tube. Patient has been evaluated with recommendations for Home 24/7 care. Will attempt to see another day as schedule permits.   Juel Burrow, [facility] [phone]

## 2020-07-11 NOTE — Progress Notes (Signed)
Initial Nutrition Assessment  DOCUMENTATION CODES:   Non-severe (moderate) malnutrition in context of acute illness/injury  INTERVENTION:  - recommend small bore NGT placement by IR under fluoro to the LOT--patient will likely need mittens after tube placed.  - TF recommendations: Osmolite 1.5 @ 20 ml/hr to increase by 10 ml every 12 hours to reach goal rate of 60 ml/hr with 45 ml Prosource TF BID. - at goal rate, this regimen will provide 2240 kcal, 112 grams protein, and 1097 ml free water.   - if tube placement and TF initiation are not feasible for any reason or will be delayed, recommend PICC and TPN initiation.  - patient at high risk for refeeding when nutrition support is started.   - weigh patient today.    NUTRITION DIAGNOSIS:   Moderate Malnutrition related to acute illness, cancer and cancer related treatments as evidenced by mild fat depletion, mild muscle depletion.  GOAL:   Patient will meet greater than or equal to 90% of their needs  MONITOR:   PO intake, Labs, Weight trends, I & O's, Other (Comment) (initiation of nutrition support)  REASON FOR ASSESSMENT:   Consult Assessment of nutrition requirement/status, Poor PO  ASSESSMENT:   44 y.o. female with medical history of ESRD on HD d/t chronic glomerulonephritis s/p kidney transplant, GERD, HTN and recently diagnosed metastatic carcinoma of unknown primary following biopsy on 10/4. She was experiencing abdominal pain x4 days PTA with associated persistent N/V and generalized weakness. She was unable to tolerate liquids or ice. She was seen in Dr. Grier Mitts office on 11/3 and was subsequently transferred to the ED.  Patient currently ordered Soft diet. Flow sheet documentation limited concerning recent PO intakes; most recent recordings are 0% of breakfast and lunch on 11/12, 0% of dinner on 11/14, 0% of breakfast on 11/18.   She was discussed during progression rounds this AM. RN reported that patient is  worsening clinically, is very confused and having visual hallucinations, having ongoing vomiting with inability to keep items down. RN also reported needing to give patient PO meds one at a time with a prolonged period of time between administration.   Patient laying in bed at the time of visit with no family/visitors present. She is noted to be very confused. She requests that family in the room (no one present) go to the waiting room. She also talks about a baby at the end of her bed.   Patient states that yesterday she was at a cookout held by her family and ate a hamburger, hotdog, and some steak (again, not an accurate recounting).  She confirms ongoing nausea and feels it is fairly severe at this time. She reports abdomen feeling hard but denies abdominal pain or pressure.   Able to talk with nursing student who had been in the room at the time of RD visit. He indicates that patient did not attempt to consume anything from breakfast tray and that lunch was recently delivered.   Weight on 11/3 was 126 lb and she has not been weighed since that date. Weight on 06/13/20 was 133 lb. This indicates 7 lb weight loss (5.2% body weight) in the past 1 month.  Surgery is not following.   Recommendations concerning nutrition support outlined above and also communicated to MD via secure chat.    Per notes: - no indication of obstruction or ileus seen on abdominal xray 11/16 - bilateral thigh pain - AKI - metastatic carcinoma thought to be renal primary - drug-induced hepatitis -  SIRS - new onset confusion--marinol had been ordered but was stopped after confusion began - HCAP/PNA that was not present on admission    Labs reviewed; creatinine: 1.16 mg/dl, Ca: 7.8 mg/dl, Mg: 2.6 mg/dl, Alk Phos elevated, AST elevated slightly, GFR: 60 mg/dl.  Medications reviewed; 4 g IV Mg sulfate x1 run 11/17, 10 mg IV reglan QID, PRN zofran, 40 mg oral protonix BID, 10 mmol IV KPhos x1 run 11/17, 5 mg  deltasone/day, 1 g carafate TID. IVF; D5-LR @ 75 ml/hr (306 kcal).     NUTRITION - FOCUSED PHYSICAL EXAM:    Most Recent Value  Orbital Region No depletion  Upper Arm Region Mild depletion  Thoracic and Lumbar Region No depletion  Buccal Region Mild depletion  Temple Region No depletion  Clavicle Bone Region Mild depletion  Clavicle and Acromion Bone Region Mild depletion  Scapular Bone Region Unable to assess  Dorsal Hand No depletion  Patellar Region No depletion  Anterior Thigh Region No depletion  Posterior Calf Region Mild depletion  Edema (RD Assessment) Mild  [BLE]  Hair Reviewed  Eyes Reviewed  Mouth Reviewed  Skin Reviewed  Nails Reviewed       Diet Order:   Diet Order            DIET SOFT Room service appropriate? Yes; Fluid consistency: Thin  Diet effective now                 EDUCATION NEEDS:   Not appropriate for education at this time  Skin:  Skin Assessment: Reviewed RN Assessment  Last BM:  11/18 (type 6)  Height:   Ht Readings from Last 1 Encounters:  06/26/20 '5\' 7"'  (1.702 m)    Weight:   Wt Readings from Last 1 Encounters:  06/26/20 57.3 kg     Estimated Nutritional Needs:  Kcal:  2005-2290 kcal Protein:  105-120 grams Fluid:  >/= 2.2 L/day     Jarome Matin, MS, RD, LDN, CNSC Inpatient Clinical Dietitian RD pager # available in [facility]  After hours/weekend pager # available in [facility]

## 2020-07-11 NOTE — Progress Notes (Signed)
PROGRESS NOTE    Candace Griffin  XNA:[id number] DOB: 1975/12/12 DOA: 06/26/2020 PCP: Richarda Osmond, DO  Brief Narrative:  The patient is a 44 year old female with a past medical history for renal failure status post kidney transplant, newly diagnosed metastatic carcinoma, likely renal in origin. Presented with 4 days of intractable nausea, vomiting,abdominal pain, not able to tolerate liquids or oral medications. Patient was initially placed on broad spectrum antibiotic therapy and received volume resuscitation.   Tacrolimus level was sent to [facility] results are still pending.  Should be available tomorrow. Medical oncology has been following.  Her Main issue had been her persistent nausea vomiting and abdominal pain with resultant extreme poor oral intake and now she has new onset confusion and a possible Pneumonia.  07/11/20 She continues to be persistently encephalopathic today and because of her worsening condition and worsening leukocytosis blood cultures were obtained and will repeat a head MRI as well as an EEG and obtain a neurology consult.  Medical oncology is considering holding her chemotherapy given her worsening condition.  Nutrition evaluated and recommending a small bore NG tube placement for tube feeding as she is immunocompromised and will not pursue a PICC line for TPN.  EEG done and was within normal limits with no seizures or epileptiform discharges seen throughout the recording.  Was given another 500 mL bolus today  Assessment & Plan:   Principal Problem:   Pancreatitis Active Problems:   GERD   Status post kidney transplant   Hypertension   Symptomatic anemia   Metastatic carcinoma to liver (HCC)   AKI (acute kidney injury) ([place])   Hepatitis   Generalized abdominal pain   Renal cell carcinoma (HCC)   Non-intractable vomiting   Malnutrition of moderate degree  Abdominal pain with nausea and vomiting, improving  -Suspect that her abdominal pain and  vomiting is likely due to the metastatic disease noted in the abdomen.  -Patient continues to have nausea though -She remains on scheduled Reglan and has not been able to tolerate very much p.o.   -Was started on Marinol by medical oncology but will stop given her new onset confusion.  Also remains on scopolamine patch and other as needed medications. -Abdominal pain seemed to be reasonably well controlled however has now worsened.  - Dose of OxyContin was increased 4 days ago.  Continue current dose for now. -Abdominal x-ray done the day before did not show any obstruction or ileus. -If continues have significant nausea vomiting may need a GI consultation and repeat abdominal CT scan   -Because she is encephalopathic and not eating very much we will have a small bore NG tube placement for tube feedings  Bilateral thigh pain -Patient mentions that she is notices pain in both her thigh areas for the last few days.  She did not mention Dr. [place] Pink until yesterday.   -She does have peripheral pulses.  No obvious erythema is noted.   -We will proceed with Doppler studies and showed no evidence of any lower extremity DVT -Also check CK ESR CRP; ESR was elevated at 118, CK was 14, CRP was 29.1 -Continue to monitor and trend and could be in the setting of her metastatic cancer  Acute kidney injury likely prerenal/anion gap metabolic acidosis/lactic acidosis/electrolyte imbalances -Baseline creatinine is around 1.0.  Presented with a creatinine of 1.42 with a BUN of 38.  She was given IV fluids with improvement and now will give another dose of normal saline bolus  -Due to the  nausea vomiting patient again became dehydrated.  Creatinine went up to 1.23 on 11/14. -She was given IV fluids with improvement in renal function.    Creatinine is slowly trending up but stable from yesterday and BUN/creatinine was 15/1.11 and today it is -She does have a mild metabolic acidosis with a CO2 of 18, anion gap of  11, chloride level 106 -Avoid further nephrotoxic medications, contrast dyes, hypotension and renally dose medications -Repeat CMP in a.m.  Hypomagnesemia -Mag Level was 1.3 and is now 2.6 -Replete with IV Mag Sulfate 3 grams yesterday -Continue to Monitor and Replete as Necessary -Repeat Mag Level in the AM   Elevated liver enzymes/metastatic carcinoma likely renal primary/drug induced hepatitis. Hyperbilirubinemia -US liver with no gallbladder stone or sonographic signs of acute cholecystitis. 1.8x1.5x1.5 cm hypoechoic lesion left lobe of the liver. -Acute viral hepatitis negative. Acetaminophen level 10. -LFTs have improved.  Bilirubin is slowly trending up and is now 5.2 stable but elevated for the last week or so -Concern for metastatic disease on imaging studies. -May obtain a right upper quadrant ultrasound  -She was started back on the cabozantinib and dose increased  By Oncology   Severe systemic inflammatory response syndrome(sepsisruled out). -Reason for her presentation is still not entirely clear.  Her lipase level was normal.   -LFTs have been improving and almost normalized.  Continue with PPI and sucralfate. -Likely she has a pneumonia and will be treated as below given her chest x-ray findings  -We will obtain blood cultures -WBC is starting to trend up and went from 15.8 has now 21.4 -She had elevated inflammatory markers with a CRP of 29.1, and ESR of 118 -The patient is more encephalopathic so will obtain MRI of the head  Status post kidney transplant -Continue with prednisone,mycophenolate and tacrolimus. -Previous rounding MD spoke with Dr. Aquilla Hacker from renal transplant at [facility], plan to continue current immunosuppressive regimen for now. Tacrolimus level sent on 11/10 noted to be 10.4. -Reference range is between 2 and 20. -Dr. Gerrie Nordmann Discussed with Dr. Altamease Oiler.  No changes recommended to her current immunosuppressant regimen.  -Medical oncology,  Dr. Irene Limbo, has also been in touch with Dr. Altamease Oiler  Hypophosphatemia -Patient's Phos level was 2.3 and improved to 2.8 -Replete with IV K-Phos 10 mmol -Continue monitor and replete as necessary -Repeat Phos Level in the AM   Anxiety -Continue with as neededlorazepam.  Chronic anemia normocytic likely malignancy related/leukocytosis -She was transfused on 11/13 for hemoglobin of 7.4.   Hemoglobin has remained relatively stable is now 8.1/25.4 yesterday and today it is 8.0/24.2 -Leukocytosis is still elevated but could be in the setting of her prednisone versus a possible pneumonia-WBC is worsening and went from 15.8 is now 21.4-continue to monitor for signs and symptoms of bleeding; no overt bleeding noted Repeat CBC in a.m.  Sinus tachycardia -Elevated heart rate most likely due to hypovolemia and because of pain issues but now could be from infection.  EKG shows sinus tachycardia.   -She was given a bolus of normal saline 500 mL yesterday and will be given another dose today -TSH was noted to be normal on 11/3.  T4 was noted to be 1.45 which is slightly on the higher side. -Thyroid function tests were repeated on 11/14 and TSH noted to be 3.08 and free T4 noted to be 1.14. -Echocardiogram was also done which does not show any significant abnormalities.   -Continue low dose metoprolol but she had not been getting this due to  her nausea and vomiting so this was increased and then changed IV 5 mg every 8 hours scheduled.  Confusion and Encephalopathy, new onset, persistent and worsening -Patient has been having periods of lucidity but then confabulates and goes off tangent and was more encephalopathic today -Check an ammonia level, head CT scan and place on delirium precautions; ammonia level was normal at 24 -Repeat blood work showing a worsening leukocytosis and stable kidney function. -Question if this is related to infection and will discontinue the Marinol given oncology  recommendations  -MRI of the brain was ordered and was negative for metastatic disease of the brain but did show a 1 cm enhancing lesion at the C2 vertebral body compatible with metastatic disease; will the brain as well as obtaining imaging and neurology consultation -Urinalysis done which showed a clear appearance with small bilirubin, negative leukocytes, negative ketones, rare bacteria, 0-5 WBCs and a urine culture blood cultures x2 pending -Medical oncology following closely  HCAP/pneumonia, not present on admission -Repeat chest x-ray today showed "There is consolidation in the left lower lobe with small left pleural effusion. There is ill-defined airspace opacity in the right base. Heart size and pulmonary vascularity are normal. No adenopathy. There is aortic atherosclerosis. No bone lesions." -We will start on CAP coverage with IV ceftriaxone and IV azithromycin -May add Breathing Tx if Necessary -Repeat CXR intermittently and showed "Stable low volume chest with infiltrates or atelectasis at the Bases." -We will need repeat chest x-ray in the a.m.   DVT prophylaxis: SCDs Code Status: FULL CODE Family Communication: No family present at bedside discussed with the mother over the telephone Disposition Plan: Pending further clinical improvement   Status is: Inpatient  Remains inpatient appropriate because:Unsafe d/c plan, IV treatments appropriate due to intensity of illness or inability to take PO and Inpatient level of care appropriate due to severity of illness  Dispo:  Patient From: Home  Planned Disposition: Home with Health Care Svc  Expected discharge date: 07/13/20  Medically stable for discharge: No  Consultants:   Medical Oncology  Neurology  Interventional radiology   Procedures: None   Antimicrobials:  Anti-infectives (From admission, onward)   Start     Dose/Rate Route Frequency Ordered Stop   07/10/20 2100  azithromycin (ZITHROMAX) 500 mg in sodium  chloride 0.9 % 250 mL IVPB        500 mg 250 mL/hr over 60 Minutes Intravenous Every 24 hours 07/10/20 1800     07/10/20 2000  cefTRIAXone (ROCEPHIN) 2 g in sodium chloride 0.9 % 100 mL IVPB        2 g 200 mL/hr over 30 Minutes Intravenous Every 24 hours 07/10/20 1800     06/28/20 1600  piperacillin-tazobactam (ZOSYN) IVPB 3.375 g  Status:  Discontinued        3.375 g 12.5 mL/hr over 240 Minutes Intravenous Every 8 hours 06/28/20 1508 07/02/20 0807   06/27/20 1200  ceFEPIme (MAXIPIME) 2 g in sodium chloride 0.9 % 100 mL IVPB  Status:  Discontinued        2 g 200 mL/hr over 30 Minutes Intravenous Every 8 hours 06/27/20 0821 06/27/20 0901   06/27/20 0500  vancomycin (VANCOREADY) IVPB 500 mg/100 mL  Status:  Discontinued        500 mg 100 mL/hr over 60 Minutes Intravenous Every 12 hours 06/26/20 1830 06/27/20 0901   06/27/20 0300  ceFEPIme (MAXIPIME) 2 g in sodium chloride 0.9 % 100 mL IVPB  Status:  Discontinued  2 g 200 mL/hr over 30 Minutes Intravenous Every 12 hours 06/26/20 1830 06/27/20 0821   06/26/20 1400  ceFEPIme (MAXIPIME) 2 g in sodium chloride 0.9 % 100 mL IVPB        2 g 200 mL/hr over 30 Minutes Intravenous  Once 06/26/20 1345 06/26/20 1509   06/26/20 1400  metroNIDAZOLE (FLAGYL) IVPB 500 mg        500 mg 100 mL/hr over 60 Minutes Intravenous  Once 06/26/20 1345 06/26/20 1736   06/26/20 1400  vancomycin (VANCOCIN) IVPB 1000 mg/200 mL premix        1,000 mg 200 mL/hr over 60 Minutes Intravenous  Once 06/26/20 1345 06/26/20 1741       Subjective: Seen and examined at bedside and she is much more confused today but not complaining abdominal pain nausea or vomiting.  She goes off on tangents and answers questions but inappropriately.  We will get neurology to further evaluate.  She denies any complaints of pain at this time.  Objective: Vitals:   07/11/20 0439 07/11/20 0900 07/11/20 1321 07/11/20 1558  BP: 115/88 (!) 127/103 132/90   Pulse: (!) 125 (!) 120 (!) 125     Resp: 20  (!) 28   Temp: 98.3 F (36.8 C) 98.4 F (36.9 C) 98.4 F (36.9 C)   TempSrc: Oral Oral Oral   SpO2: 100% 96% 100% 100%  Weight:      Height:        Intake/Output Summary (Last 24 hours) at 07/11/2020 1646 Last data filed at 07/11/2020 1093 Gross per 24 hour  Intake 1526.38 ml  Output 390 ml  Net 1136.38 ml   Filed Weights   06/26/20 1815  Weight: 57.3 kg   Examination: Physical Exam:  Constitutional: WN/WD African-American female currently no acute distress appears calm and encephalopathic but does appear uncomfortable again Eyes: Lids and conjunctivae normal, sclerae slightly icteric ENMT: External Ears, Nose appear normal. Grossly normal hearing.  Neck: Appears normal, supple, no cervical masses, normal ROM, no appreciable thyromegaly; no JVD Respiratory: Diminished to auscultation bilaterally, no wheezing, rales, rhonchi or crackles. Normal respiratory effort and patient is not tachypenic. No accessory muscle use.  Unlabored bleeding Cardiovascular: RRR, no murmurs / rubs / gallops. S1 and S2 auscultated. No extremity edema. 2+ pedal pulses. No carotid bruits.  Abdomen: Soft, non-tender, non-distended. No masses palpated. No appreciable hepatosplenomegaly. Bowel sounds positive.  GU: Deferred. Musculoskeletal: No clubbing / cyanosis of digits/nails. No joint deformity upper and lower extremities.  Skin: No rashes, lesions, ulcers on a limited skin evaluation. No induration; Warm and dry.  Neurologic: CN 2-12 grossly intact with no focal deficits. Romberg sign and cerebellar reflexes not assessed.  Psychiatric: Impaired judgment and insight. Alert and oriented x 1. Normal mood and appropriate affect.   Data Reviewed: I have personally reviewed following labs and imaging studies  CBC: Recent Labs  Lab 07/07/20 0415 07/08/20 0419 07/10/20 0541 07/10/20 1727 07/11/20 0411  WBC 14.0* 16.0* 15.8* 15.9* 21.4*  NEUTROABS  --   --   --  13.0* 18.4*  HGB 8.7*  8.1* 8.1* 7.7* 8.0*  HCT 27.0* 25.3* 25.4* 23.3* 24.2*  MCV 83.6 84.3 84.4 84.4 83.4  PLT 198 154 138* 130* 235*   Basic Metabolic Panel: Recent Labs  Lab 07/07/20 0415 07/07/20 0415 07/08/20 0419 07/09/20 0350 07/10/20 0541 07/10/20 1727 07/11/20 0411  NA 137   < > 137 137 135 135 137  K 4.5   < > 4.1 4.3 4.3  3.7 4.2  CL 106   < > 106 107 106 106 105  CO2 22   < > 22 22 21* 18* 20*  GLUCOSE 117*   < > 130* 144* 148* 145* 109*  BUN 17   < > $R'15 13 12 15 15  'Ss$ CREATININE 1.23*   < > 1.07* 1.16* 1.20* 1.11* 1.16*  CALCIUM 8.1*   < > 7.8* 8.0* 8.1* 7.7* 7.8*  MG 1.2*  --   --  1.9  --  1.3* 2.6*  PHOS  --   --   --   --   --  2.3* 2.8   < > = values in this interval not displayed.   GFR: Estimated Creatinine Clearance: 56.6 mL/min (A) (by C-G formula based on SCr of 1.16 mg/dL (H)). Liver Function Tests: Recent Labs  Lab 07/07/20 0415 07/08/20 0419 07/09/20 0350 07/10/20 1727 07/11/20 0411  AST 52* 46* 50* 50* 53*  ALT 53* 44 38 31 32  ALKPHOS 272* 230* 205* 208* 214*  BILITOT 4.8* 4.3* 4.0* 4.9* 5.2*  PROT 5.5* 5.1* 5.4* 5.1* 5.5*  ALBUMIN 1.9* 1.7* 1.8* 1.7* 1.9*   No results for input(s): LIPASE, AMYLASE in the last 168 hours. Recent Labs  Lab 07/10/20 1727  AMMONIA 24   Coagulation Profile: No results for input(s): INR, PROTIME in the last 168 hours. Cardiac Enzymes: Recent Labs  Lab 07/10/20 0541  CKTOTAL 14*   BNP (last 3 results) No results for input(s): PROBNP in the last 8760 hours. HbA1C: No results for input(s): HGBA1C in the last 72 hours. CBG: No results for input(s): GLUCAP in the last 168 hours. Lipid Profile: No results for input(s): CHOL, HDL, LDLCALC, TRIG, CHOLHDL, LDLDIRECT in the last 72 hours. Thyroid Function Tests: No results for input(s): TSH, T4TOTAL, FREET4, T3FREE, THYROIDAB in the last 72 hours. Anemia Panel: No results for input(s): VITAMINB12, FOLATE, FERRITIN, TIBC, IRON, RETICCTPCT in the last 72 hours. Sepsis Labs: No  results for input(s): PROCALCITON, LATICACIDVEN in the last 168 hours.  No results found for this or any previous visit (from the past 240 hour(s)).   RN Pressure Injury Documentation:     Estimated body mass index is 19.79 kg/m as calculated from the following:   Height as of this encounter: $RemoveBeforeD'5\' 7"'QqVEEyoQFHgwKn$  (1.702 m).   Weight as of this encounter: 57.3 kg.  Malnutrition Type:  Nutrition Problem: Moderate Malnutrition Etiology: acute illness, cancer and cancer related treatments   Malnutrition Characteristics:  Signs/Symptoms: mild fat depletion, mild muscle depletion   Nutrition Interventions:  Interventions: Refer to RD note for recommendations     Radiology Studies: CT HEAD WO CONTRAST  Result Date: 07/10/2020 CLINICAL DATA:  Delirium, history of urologic malignancy. EXAM: CT HEAD WITHOUT CONTRAST TECHNIQUE: Contiguous axial images were obtained from the base of the skull through the vertex without intravenous contrast. COMPARISON:  MRI 06/28/2020 FINDINGS: Brain: No evidence of acute infarction, hemorrhage, hydrocephalus, extra-axial collection, mass effect or visible mass lesion 4 within the limitations of an unenhanced CT exam. Basal cisterns are patent. Midline intracranial structures are normal. Cerebellar tonsils are normally positioned. Vascular: No hyperdense vessel or unexpected calcification. Skull: Few prominent venous lakes. No convincing focal sclerotic or lytic osseous metastatic disease. A previously seen C2 vertebral body metastasis is not within the margins of imaging. Scalp soft tissues are unremarkable. Sinuses/Orbits: Paranasal sinuses and mastoid air cells are predominantly clear. Included orbital structures are unremarkable. Other: None IMPRESSION: 1. No acute intracranial findings. No visible metastatic  lesions within the limitations of unenhanced CT imaging. 2. A previously seen C2 vertebral body metastasis is not within the margins of imaging. Electronically Signed    By: Lovena Le M.D.   On: 07/10/2020 19:59   DG CHEST PORT 1 VIEW  Result Date: 07/11/2020 CLINICAL DATA:  Shortness of breath EXAM: PORTABLE CHEST 1 VIEW COMPARISON:  07/10/2020 FINDINGS: Hazy opacity of the left more than right lower chest. No visible effusion or pneumothorax. Normal heart size and mediastinal contours. IMPRESSION: Stable low volume chest with infiltrates or atelectasis at the bases. Electronically Signed   By: Monte Fantasia M.D.   On: 07/11/2020 07:39   DG CHEST PORT 1 VIEW  Result Date: 07/10/2020 CLINICAL DATA:  Liver carcinoma.  Hypertension. EXAM: PORTABLE CHEST 1 VIEW COMPARISON:  June 26, 2020 FINDINGS: There is consolidation in the left lower lobe with small left pleural effusion. There is ill-defined airspace opacity in the right base. Heart size and pulmonary vascularity are normal. No adenopathy. There is aortic atherosclerosis. No bone lesions. IMPRESSION: Consolidation left lower lobe with small left pleural effusion. Patchy infiltrate right base. Heart size within normal limits. No adenopathy evident. Aortic Atherosclerosis (ICD10-I70.0). Electronically Signed   By: Lowella Grip III M.D.   On: 07/10/2020 13:48   EEG adult  Result Date: 07/11/2020 Lora Havens, MD     07/11/2020  3:41 PM Patient Name: Candace Griffin MRN: [id number] Epilepsy Attending: Lora Havens Referring Physician/Provider: Dr Carlisle Cater Date: 07/11/2020 Duration: 24.15 minutes Patient history: 44 year old female with altered mental status.  EEG to evaluate for seizures. Level of alertness: Awake AEDs during EEG study: None Technical aspects: This EEG study was done with scalp electrodes positioned according to the 10-20 International system of electrode placement. Electrical activity was acquired at a sampling rate of $Remov'500Hz'XlQKgG$  and reviewed with a high frequency filter of $RemoveB'70Hz'LqcgTqKv$  and a low frequency filter of $RemoveB'1Hz'oFFigreU$ . EEG data were recorded continuously and digitally stored.  Description: The posterior dominant rhythm consists of 8 Hz activity of moderate voltage (25-35 uV) seen predominantly in posterior head regions, symmetric and reactive to eye opening and eye closing.  Hyperventilation and photic stimulation were not performed.   IMPRESSION: This study is within normal limits. No seizures or epileptiform discharges were seen throughout the recording. Priyanka Barbra Sarks   Scheduled Meds: . cabozantinib  20 mg Oral Daily  . enoxaparin (LOVENOX) injection  40 mg Subcutaneous QHS  . mouth rinse  15 mL Mouth Rinse BID  . metoCLOPramide (REGLAN) injection  10 mg Intravenous Q6H  . metoprolol tartrate  5 mg Intravenous Q8H  . mycophenolate  500 mg Oral BID  . oxyCODONE  20 mg Oral BID  . pantoprazole  40 mg Oral BID  . predniSONE  5 mg Oral Q breakfast  . promethazine  12.5 mg Intravenous TID AC  . scopolamine  1 patch Transdermal Q72H  . sodium chloride flush  10-40 mL Intracatheter Q12H  . sucralfate  1 g Oral TID WC & HS  . Tacrolimus ER  4 mg Oral Daily   Continuous Infusions: . azithromycin 500 mg (07/10/20 2125)  . cefTRIAXone (ROCEPHIN)  IV 2 g (07/10/20 2018)  . dextrose 5% lactated ringers 75 mL/hr at 07/10/20 1645  . [START ON 07/15/2020] thiamine injection    . thiamine injection      LOS: 15 days    Kerney Elbe, DO [facility] PAGER is on [place]  If 7PM-7AM, please contact night-coverage  [email or web address]

## 2020-07-11 NOTE — Progress Notes (Signed)
EEG completed, results pending. 

## 2020-07-11 NOTE — Procedures (Addendum)
Patient Name: Candace Griffin  MRN: [id number]  Epilepsy Attending: Lora Havens  Referring Physician/Provider: Dr Carlisle Cater Date: 07/11/2020 Duration: 24.15 minutes  Patient history: 44 year old female with altered mental status.  EEG to evaluate for seizures.  Level of alertness: Awake  AEDs during EEG study: None  Technical aspects: This EEG study was done with scalp electrodes positioned according to the 10-20 International system of electrode placement. Electrical activity was acquired at a sampling rate of 500Hz  and reviewed with a high frequency filter of 70Hz  and a low frequency filter of 1Hz . EEG data were recorded continuously and digitally stored.   Description: The posterior dominant rhythm consists of 8 Hz activity of moderate voltage (25-35 uV) seen predominantly in posterior head regions, symmetric and reactive to eye opening and eye closing.  Hyperventilation and photic stimulation were not performed.     IMPRESSION: This study is within normal limits. No seizures or epileptiform discharges were seen throughout the recording.  Lizette Pazos Barbra Sarks

## 2020-07-11 NOTE — Consult Note (Signed)
NEURO HOSPITALIST CONSULT NOTE   Requestig physician: Dr. Alfredia Ferguson  Reason for Consult: New onset of AMS in the setting of hospitalization for metastatic cancer  History obtained from:   Patient and Chart    HPI:                                                                                                                                          Candace Griffin is an 44 y.o. female with a PMHx of renal failure status post kidney transplant, HTN and newly diagnosed metastatic carcinoma, which is likely renal in origin. She presented to the hospital with 4 days of intractable N/V and abdominal pain, not being able to tolerate liquids or oral medications. She was initially placed on broad spectrum antibiotic therapy and received volume resuscitation.Medical oncology has been following. She has continued to have persistent nausea with vomiting and abdominal pain. Her oral intake continues to be poor. Neurology was consulted for new onset confusion.  Past Medical History:  Diagnosis Date  . Chronic glomerulonephritis   . Dialysis patient ([employer])   . End stage renal disease ([place])   . GERD (gastroesophageal reflux disease)   . History of renal transplant 10/24/11  . Hypertension     Past Surgical History:  Procedure Laterality Date  . COLPOSCOPY  2016  . diaylsis shunt Right    arm  . KIDNEY TRANSPLANT  10/24/2011   deceased donor kidney    Family History  Problem Relation Age of Onset  . Diabetes Father   . Diabetes Paternal Aunt   . Diabetes Paternal Grandmother   . Alcohol abuse Paternal Grandmother   . Alcohol abuse Paternal Uncle   . Cancer Maternal Grandmother        breast  . Breast cancer Maternal Grandmother   . Alcohol abuse Paternal Grandfather               Social History:  reports that she has never smoked. She has never used smokeless tobacco. She reports that she does not drink alcohol and does not use drugs.  Allergies  Allergen Reactions   . Lisinopril Cough  . Tape Rash and Other (See Comments)    Causes welts also    MEDICATIONS:  Scheduled: . cabozantinib  20 mg Oral Daily  . enoxaparin (LOVENOX) injection  40 mg Subcutaneous QHS  . mouth rinse  15 mL Mouth Rinse BID  . metoCLOPramide (REGLAN) injection  10 mg Intravenous Q6H  . metoprolol tartrate  5 mg Intravenous Q8H  . mycophenolate  500 mg Oral BID  . oxyCODONE  20 mg Oral BID  . pantoprazole  40 mg Oral BID  . predniSONE  5 mg Oral Q breakfast  . promethazine  12.5 mg Intravenous TID AC  . scopolamine  1 patch Transdermal Q72H  . sodium chloride flush  10-40 mL Intracatheter Q12H  . sucralfate  1 g Oral TID WC & HS  . Tacrolimus ER  4 mg Oral Daily   Continuous: . azithromycin 500 mg (07/10/20 2125)  . cefTRIAXone (ROCEPHIN)  IV 2 g (07/10/20 2018)  . dextrose 5% lactated ringers 75 mL/hr at 07/10/20 1645  . [START ON 07/15/2020] thiamine injection    . thiamine injection       ROS:                                                                                                                                       As per HPI. She is also complaining of significant abdominal pain. She is unable to provide a completely reliable ROS due to her confusion and confabulation.   Blood pressure 132/90, pulse (!) 125, temperature 98.4 F (36.9 C), temperature source Oral, resp. rate (!) 28, height 5\' 7"  (1.702 m), weight 57.3 kg, SpO2 100 %.   General Examination:                                                                                                       Physical Exam  HEENT-  [place]/AT   Lungs- Respirations unlabored Abdomen- Distended Extremities- RLE with decreased muscle bulk diffusely. Mild edema is noted.   Neurological Examination Mental Status: Awake but mildly drowsy. MIldly decreased level of alertness. Oriented to city and  state. Had difficulty with the year. Not oriented to the day or month. Speech is fluent with intact comprehension and naming. Concentration impaired - unable to correctly spell WORLD backwards. No dysarthria. No agitation noted. She was observed to be talking with herself while staring into space, possibly while having a visual hallucination.  Cranial Nerves: II: Visual fields intact with no extinction to DSS. PERRL.  III,IV, VI: No ptosis. EOMI. No nystagmus. Mild saccadic quality to her visual pursuits.  V,VII: Smile symmetric. Facial temp sensation equal bilaterally VIII: Hearing intact to voice IX,X: No hypophonia XI: Symmetric XII: Midline tongue extension Motor: BUE 4/5 proximally and distally. Easily fatiguable.  BLE with impaired movement due to abdominal and pelvic pain, with movement-induced pain worse on the right. Decreased muscle bulk diffusely to RLE. Unable to lift RLE antigravity. Knee extension, knee flexion and ADF 2/5.  LLE with 2-3/5 strength proximally and distally, compromised by pain, but stronger than on the right.  Decreased tone to all 4 extremities.  Sensory: Temp sensation intact to upper and lower extremities proximally.  Deep Tendon Reflexes: Hypoactive throughout Plantars: Mute bilaterally  Cerebellar: No ataxia with FNF bilaterally, but with slow movements. Unable to test H-S due to weakness and easy fatigueability.  Gait: Unable to assess   Lab Results: Basic Metabolic Panel: Recent Labs  Lab 07/07/20 0415 07/07/20 0415 07/08/20 0419 07/08/20 0419 07/09/20 0350 07/09/20 0350 07/10/20 0541 07/10/20 1727 07/11/20 0411  NA 137   < > 137  --  137  --  135 135 137  K 4.5   < > 4.1  --  4.3  --  4.3 3.7 4.2  CL 106   < > 106  --  107  --  106 106 105  CO2 22   < > 22  --  22  --  21* 18* 20*  GLUCOSE 117*   < > 130*  --  144*  --  148* 145* 109*  BUN 17   < > 15  --  13  --  12 15 15   CREATININE 1.23*   < > 1.07*  --  1.16*  --  1.20* 1.11* 1.16*   CALCIUM 8.1*   < > 7.8*   < > 8.0*   < > 8.1* 7.7* 7.8*  MG 1.2*  --   --   --  1.9  --   --  1.3* 2.6*  PHOS  --   --   --   --   --   --   --  2.3* 2.8   < > = values in this interval not displayed.    CBC: Recent Labs  Lab 07/07/20 0415 07/08/20 0419 07/10/20 0541 07/10/20 1727 07/11/20 0411  WBC 14.0* 16.0* 15.8* 15.9* 21.4*  NEUTROABS  --   --   --  13.0* 18.4*  HGB 8.7* 8.1* 8.1* 7.7* 8.0*  HCT 27.0* 25.3* 25.4* 23.3* 24.2*  MCV 83.6 84.3 84.4 84.4 83.4  PLT 198 154 138* 130* 146*    Cardiac Enzymes: Recent Labs  Lab 07/10/20 0541  CKTOTAL 14*    Lipid Panel: No results for input(s): CHOL, TRIG, HDL, CHOLHDL, VLDL, LDLCALC in the last 168 hours.  Imaging: CT HEAD WO CONTRAST  Result Date: 07/10/2020 CLINICAL DATA:  Delirium, history of urologic malignancy. EXAM: CT HEAD WITHOUT CONTRAST TECHNIQUE: Contiguous axial images were obtained from the base of the skull through the vertex without intravenous contrast. COMPARISON:  MRI 06/28/2020 FINDINGS: Brain: No evidence of acute infarction, hemorrhage, hydrocephalus, extra-axial collection, mass effect or visible mass lesion 4 within the limitations of an unenhanced CT exam. Basal cisterns are patent. Midline intracranial structures are normal. Cerebellar tonsils are normally positioned. Vascular: No hyperdense vessel or unexpected calcification. Skull: Few prominent venous lakes. No convincing focal sclerotic or lytic osseous metastatic disease. A previously seen C2 vertebral body metastasis is not within the margins of imaging. Scalp soft tissues are unremarkable. Sinuses/Orbits: Paranasal sinuses and mastoid air  cells are predominantly clear. Included orbital structures are unremarkable. Other: None IMPRESSION: 1. No acute intracranial findings. No visible metastatic lesions within the limitations of unenhanced CT imaging. 2. A previously seen C2 vertebral body metastasis is not within the margins of imaging. Electronically  Signed   By: Lovena Le M.D.   On: 07/10/2020 19:59   DG CHEST PORT 1 VIEW  Result Date: 07/11/2020 CLINICAL DATA:  Shortness of breath EXAM: PORTABLE CHEST 1 VIEW COMPARISON:  07/10/2020 FINDINGS: Hazy opacity of the left more than right lower chest. No visible effusion or pneumothorax. Normal heart size and mediastinal contours. IMPRESSION: Stable low volume chest with infiltrates or atelectasis at the bases. Electronically Signed   By: Monte Fantasia M.D.   On: 07/11/2020 07:39   DG CHEST PORT 1 VIEW  Result Date: 07/10/2020 CLINICAL DATA:  Liver carcinoma.  Hypertension. EXAM: PORTABLE CHEST 1 VIEW COMPARISON:  June 26, 2020 FINDINGS: There is consolidation in the left lower lobe with small left pleural effusion. There is ill-defined airspace opacity in the right base. Heart size and pulmonary vascularity are normal. No adenopathy. There is aortic atherosclerosis. No bone lesions. IMPRESSION: Consolidation left lower lobe with small left pleural effusion. Patchy infiltrate right base. Heart size within normal limits. No adenopathy evident. Aortic Atherosclerosis (ICD10-I70.0). Electronically Signed   By: Lowella Grip III M.D.   On: 07/10/2020 13:48   EEG adult  Result Date: 07/11/2020 Lora Havens, MD     07/11/2020  3:41 PM Patient Name: Candace Griffin MRN: [id number] Epilepsy Attending: Lora Havens Referring Physician/Provider: Dr Carlisle Cater Date: 07/11/2020 Duration: 24.15 minutes Patient history: 44 year old female with altered mental status.  EEG to evaluate for seizures. Level of alertness: Awake AEDs during EEG study: None Technical aspects: This EEG study was done with scalp electrodes positioned according to the 10-20 International system of electrode placement. Electrical activity was acquired at a sampling rate of 500Hz  and reviewed with a high frequency filter of 70Hz  and a low frequency filter of 1Hz . EEG data were recorded continuously and digitally  stored. Description: The posterior dominant rhythm consists of 8 Hz activity of moderate voltage (25-35 uV) seen predominantly in posterior head regions, symmetric and reactive to eye opening and eye closing.  Hyperventilation and photic stimulation were not performed.   IMPRESSION: This study is within normal limits. No seizures or epileptiform discharges were seen throughout the recording. Lora Havens   CT of abdomen and pelvis (11/3):  1. Patient's known hepatic lesions are not well-defined on this noncontrast exam. There may be a small subcapsular hematoma inferiorly from recent liver biopsy, not well-defined. No evidence of hemoperitoneum. 2. Bulky retroperitoneal and bilateral inguinal adenopathy, grossly unchanged from prior. 3. Transplant kidney in the left lower quadrant with unchanged fullness of the renal collecting system. Sequela of failed transplant in the right iliac fossa. Stable soft tissue density in the region of the native left kidney lower hilum. 4. Bulky uterus with fibroids. 5. Nonspecific sclerosis involving the right aspect of L2 vertebral Body.  Postcontrast CT abdoment and pelvis (11/8): Findings include the metastatic lesions noted in the 11/3 report, in addition to poorly marginated sclerotic lesions in T12, L1, and L2 vertebral bodies, progressive since 05/16/2020.   Assessment: 44 year old female with new onset of confusion during her hospitalization for metastatic cancer 1. Her symptoms include confabulation, formed visual hallucinations, poor concentration and impaired memory for recent events. On exam, her speech is fluent with intact  comprehension and naming, but she is disoriented and was observed to be talking with herself while staring into space, possibly while having a visual hallucination. There is no lateralized motor deficit, although BLE are weak and there is decreased muscle mass involving her proximal and distal RLE which she states has been  progressive for about the last 2-3 months.  2. Her AMS is most likely multifactorial in the setting of hepatic dysfunction, chemotherapy, sedating pain medications and hospitalization (hospital delirium). Given her confabulation and recent nausea with recurrent episodes of vomiting, acute thiamine deficiency is also on the DDx. Given her elevated white count, infection may be contributing to her AMS. 3. MRI brain: Negative for metastatic disease to the brain. Of note, there is a 1 cm enhancing lesion in the C2 vertebral body compatible with metastatic disease, without associated fracture or cord compression. 4. Ammonia level is normal.  5. EEG: This study is within normal limits. No seizures or epileptiform discharges were seen throughout the recording.  Recommendations: 1. STAT thiamine level. After it has been drawn, start IV high-dose thiamine at 500 mg TID x 3 days, then 250 mg TID x 3 days, then 100 mg po qd thereafter (IV doses have been ordered).  2. Limit sedating medications. Agree with stopping Marinol. 3. Radiologic surveillance of the C2 vertebral body lesion.  4. Will need MRI of the lumbar spine and pelvis with and without contrast to assess for possible metastases infiltrating or producing mass effect upon the right lumbosacral plexus given her exam finding of diffuse RLE muscle atrophy with weakness.  5. Other evaluation/management per Heme/Onc team.     Electronically signed: Dr. Kerney Elbe 07/11/2020, 4:13 PM

## 2020-07-11 NOTE — Progress Notes (Addendum)
HEMATOLOGY-ONCOLOGY PROGRESS NOTE  SUBJECTIVE: Confused this morning.  She knows her name, but thinks that she has at [facility] and thinks the year is 2003.  She does not recall who her primary oncologist is when given his name.  She tells me that she does not have any abdominal pain, nausea, or vomiting this morning.  Oncology History  Metastatic carcinoma to liver ([place])  05/30/2020 Initial Diagnosis   Metastatic carcinoma to liver ([place])   06/13/2020 -  Chemotherapy   The patient had dexamethasone (DECADRON) 4 MG tablet, 8 mg, Oral, Daily, 1 of 1 cycle, Start date: 06/10/2020, End date: -- palonosetron (ALOXI) injection 0.25 mg, 0.25 mg, Intravenous,  Once, 1 of 4 cycles Administration: 0.25 mg (06/13/2020) pegfilgrastim-jmdb (FULPHILA) injection 6 mg, 6 mg, Subcutaneous,  Once, 1 of 4 cycles Administration: 6 mg (06/15/2020) CARBOplatin (PARAPLATIN) 390 mg in sodium chloride 0.9 % 250 mL chemo infusion, 390 mg (78.9 % of original dose 491.5 mg), Intravenous,  Once, 1 of 4 cycles Dose modification:   (original dose 491.5 mg, Cycle 1) Administration: 390 mg (06/13/2020) fosaprepitant (EMEND) 150 mg in sodium chloride 0.9 % 145 mL IVPB, 150 mg, Intravenous,  Once, 1 of 4 cycles Administration: 150 mg (06/13/2020) PACLitaxel (TAXOL) 54 mg in sodium chloride 0.9 % 150 mL chemo infusion (> 80mg /m2), 30 mg/m2 = 54 mg (60 % of original dose 50 mg/m2), Intravenous,  Once, 1 of 4 cycles Dose modification: 50 mg/m2 (original dose 50 mg/m2, Cycle 1, Reason: Change in LFTs), 30 mg/m2 (original dose 50 mg/m2, Cycle 1, Reason: Change in LFTs) Administration: 54 mg (06/13/2020)  for chemotherapy treatment.    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 with unknown primary site [facility])   06/13/2020 -  Chemotherapy   The patient had dexamethasone (DECADRON) 4 MG  tablet, 8 mg, Oral, Daily, 1 of 1 cycle, Start date: 06/10/2020, End date: -- palonosetron (ALOXI) injection 0.25 mg, 0.25 mg, Intravenous,  Once, 1 of 4 cycles Administration: 0.25 mg (06/13/2020) pegfilgrastim-jmdb (FULPHILA) injection 6 mg, 6 mg, Subcutaneous,  Once, 1 of 4 cycles Administration: 6 mg (06/15/2020) CARBOplatin (PARAPLATIN) 390 mg in sodium chloride 0.9 % 250 mL chemo infusion, 390 mg (78.9 % of original dose 491.5 mg), Intravenous,  Once, 1 of 4 cycles Dose modification:   (original dose 491.5 mg, Cycle 1) Administration: 390 mg (06/13/2020) fosaprepitant (EMEND) 150 mg in sodium chloride 0.9 % 145 mL IVPB, 150 mg, Intravenous,  Once, 1 of 4 cycles Administration: 150 mg (06/13/2020) PACLitaxel (TAXOL) 54 mg in sodium chloride 0.9 % 150 mL chemo infusion (> 80mg /m2), 30 mg/m2 = 54 mg (60 % of original dose 50 mg/m2), Intravenous,  Once, 1 of 4 cycles Dose modification: 50 mg/m2 (original dose 50 mg/m2, Cycle 1, Reason: Change in LFTs), 30 mg/m2 (original dose 50 mg/m2, Cycle 1, Reason: Change in LFTs) Administration: 54 mg (06/13/2020)  for chemotherapy treatment.       REVIEW OF SYSTEMS:   .10 Point review of Systems was done is negative except as noted above.    PHYSICAL EXAMINATION: ECOG PERFORMANCE STATUS: 2 - Symptomatic, <50% confined to bed  Vitals:   07/11/20 0439 07/11/20 0900  BP: 115/88 (!) 127/103  Pulse: (!) 125 (!) 120  Resp: 20   Temp: 98.3 F (36.8 C) 98.4 F (36.9 C)  SpO2: 100% 96%   [employer]  06/26/20 1815  Weight: 57.3 kg    Intake/Output from previous day: 11/17 0701 - 11/18 0700 In: 2326.4 [P.O.:120; I.V.:1194.6; IV Piggyback:1011.8] Out: 390 [Urine:390]  . GENERAL: Awake and alert, confused, no distress SKIN: no acute rashes, no significant lesions EYES: conjunctiva are pink and non-injected, sclera anicteric OROPHARYNX: MMM, no exudates, no oropharyngeal erythema or ulceration NECK: supple, no JVD LYMPH:  no palpable  lymphadenopathy in the cervical, axillary or inguinal regions LUNGS: clear to auscultation b/l with normal respiratory effort HEART: regular rate & rhythm ABDOMEN: Positive bowel sounds, soft, nontender Extremity: no pedal edema PSYCH: Alert, oriented to person only NEURO: no focal motor/sensory deficits   LABORATORY DATA:  I have reviewed the data as listed CMP Latest Ref Rng & Units 07/03/2020 07/01/2020  Glucose 70 - 99 mg/dL 127(H) 102(H)  BUN 6 - 20 mg/dL 14 19  Creatinine 0.44 - 1.00 mg/dL 0.98 1.04(H)  Sodium 135 - 145 mmol/L 135 136  Potassium 3.5 - 5.1 mmol/L 3.7 4.9  Chloride 98 - 111 mmol/L 107 106  CO2 22 - 32 mmol/L 21(L) 19(L)  Calcium 8.9 - 10.3 mg/dL 8.0(L) 8.3(L)  Total Protein 6.5 - 8.1 g/dL 5.6(L) 5.5(L)  Total Bilirubin 0.3 - 1.2 mg/dL 4.0(H) 4.6(H)  Alkaline Phos 38 - 126 U/L 298(H) 389(H)  AST 15 - 41 U/L 65(H) 158(H)  ALT 0 - 44 U/L 124(H) 228(H)   . CBC Latest Ref Rng & Units 07/03/2020 07/01/2020  WBC 4.0 - 10.5 K/uL 15.9(H) 21.9(H)  Hemoglobin 12.0 - 15.0 g/dL 8.3(L) 8.5(L)  Hematocrit 36 - 46 % 25.9(L) 28.1(L)  Platelets 150 - 400 K/uL 189 155   CT ABDOMEN PELVIS WO CONTRAST  Result Date: 06/26/2020 CLINICAL DATA:  Diffuse abdominal pain and vomiting. History of cancer and renal transplant. Liver mass post recent liver biopsy. EXAM: CT ABDOMEN AND PELVIS WITHOUT CONTRAST TECHNIQUE: Multidetector CT imaging of the abdomen and pelvis was performed following the standard protocol without IV contrast. COMPARISON:  Contrast-enhanced exam 05/27/2020. FINDINGS: Lower chest: No focal airspace disease, pleural fluid, or pulmonary nodularity. Heart is normal in size. Hepatobiliary: Patient's known hepatic lesions are not well demonstrated on noncontrast exam. Liver parenchyma is heterogeneous. There may be a small subcapsular hematoma inferiorly from recent liver biopsy, not well-defined on this noncontrast exam. The gallbladder is present. There is high-density  material in the gallbladder lumen, possibly sludge. No calcified gallstone. Common bile duct is poorly defined, but no evidence of biliary dilatation. Pancreas: No ductal dilatation or inflammation. Spleen: Normal in size without focal abnormality. Adrenals/Urinary Tract: Normal adrenal glands. Chronic bilateral native renal atrophy. Solid-appearing lesion in the lower left renal hilum was better appreciated on prior contrast-enhanced CT, grossly unchanged, series 2, image 35. Transplant kidney in the left lower quadrant. Scarring in the left lower pole is better appreciated on prior contrast-enhanced exam. Similar fullness of the renal collecting system without frank hydronephrosis. No transplant perinephric edema. Sequela of failed transplant in the right iliac fossa. The urinary bladder is unremarkable. Stomach/Bowel: Bowel evaluation is limited in the absence of enteric contrast. There is no bowel obstruction or evidence of bowel inflammation. The appendix is normal. Vascular/Lymphatic: Bulky retroperitoneal adenopathy, some of which is low-density and typical of necrosis. Occasional areas of nodal calcification. Adenopathy extends from the retrocrural space to the bilateral common iliac arteries, and left external iliac station. There also enlarged bilateral inguinal lymph nodes, left greater than right. Index aortocaval node measures 4.1 x 2.4 cm, previously 4.2 x 2.6 cm.  Overall adenopathy is not significantly changed in the interim allowing for noncontrast technique. There is aortic and branch atherosclerosis. Reproductive: Bulky uterus with fibroids.  No obvious adnexal mass. Other: There is trace free fluid in the pelvis, slightly increased from prior exam. No other free fluid or ascites. No free air. Musculoskeletal: Ill-defined sclerosis involving right L2 vertebral body, nonspecific but unchanged from previous. Sclerotic density in the left acetabulum. There is hemi transitional lumbosacral anatomy. No  fracture or acute osseous abnormality. IMPRESSION: 1. Patient's known hepatic lesions are not well-defined on this noncontrast exam. There may be a small subcapsular hematoma inferiorly from recent liver biopsy, not well-defined. No evidence of hemoperitoneum. 2. Bulky retroperitoneal and bilateral inguinal adenopathy, grossly unchanged from prior. 3. Transplant kidney in the left lower quadrant with unchanged fullness of the renal collecting system. Sequela of failed transplant in the right iliac fossa. Stable soft tissue density in the region of the native left kidney lower hilum. 4. Bulky uterus with fibroids. 5. Nonspecific sclerosis involving the right aspect of L2 vertebral body. Aortic Atherosclerosis (ICD10-I70.0). Electronically Signed   By: Keith Rake M.D.   On: 06/26/2020 16:45   CT HEAD WO CONTRAST  Result Date: 07/10/2020 CLINICAL DATA:  Delirium, history of urologic malignancy. EXAM: CT HEAD WITHOUT CONTRAST TECHNIQUE: Contiguous axial images were obtained from the base of the skull through the vertex without intravenous contrast. COMPARISON:  MRI 06/28/2020 FINDINGS: Brain: No evidence of acute infarction, hemorrhage, hydrocephalus, extra-axial collection, mass effect or visible mass lesion 4 within the limitations of an unenhanced CT exam. Basal cisterns are patent. Midline intracranial structures are normal. Cerebellar tonsils are normally positioned. Vascular: No hyperdense vessel or unexpected calcification. Skull: Few prominent venous lakes. No convincing focal sclerotic or lytic osseous metastatic disease. A previously seen C2 vertebral body metastasis is not within the margins of imaging. Scalp soft tissues are unremarkable. Sinuses/Orbits: Paranasal sinuses and mastoid air cells are predominantly clear. Included orbital structures are unremarkable. Other: None IMPRESSION: 1. No acute intracranial findings. No visible metastatic lesions within the limitations of unenhanced CT  imaging. 2. A previously seen C2 vertebral body metastasis is not within the margins of imaging. Electronically Signed   By: Lovena Le M.D.   On: 07/10/2020 19:59   MR Brain W Wo Contrast  Result Date: 06/28/2020 CLINICAL DATA:  Urologic cancer, staging EXAM: MRI HEAD WITHOUT AND WITH CONTRAST TECHNIQUE: Multiplanar, multiecho pulse sequences of the brain and surrounding structures were obtained without and with intravenous contrast. CONTRAST:  43mL GADAVIST GADOBUTROL 1 MMOL/ML IV SOLN COMPARISON:  MRI head 11/17/2016 FINDINGS: Brain: No acute infarction, hemorrhage, hydrocephalus, extra-axial collection or mass lesion. Normal white matter. Negative for metastatic disease to the brain. No enhancing mass lesion in the brain. Vascular: Normal arterial flow voids. Skull and upper cervical spine: Enhancing lesion in the lower body of the C2 vertebral body. This shows decreased signal prior to contrast and is consistent with metastatic disease. No fracture or cord compression. No skull lesion identified. Benign fatty lesion in the left parietal bone. Sinuses/Orbits: Paranasal sinuses clear.  Negative orbit Other: None IMPRESSION: Negative for metastatic disease to the brain 1 cm enhancing lesion C2 vertebral body compatible with metastatic disease. No fracture or cord compression Electronically Signed   By: Franchot Gallo M.D.   On: 06/28/2020 21:31   CT ABDOMEN PELVIS W CONTRAST  Result Date: 07/01/2020 CLINICAL DATA:  Sepsis, abdominal pain, previous kidney transplant x2. Metastatic carcinoma of unknown primary. EXAM: CT ABDOMEN AND  PELVIS WITH CONTRAST TECHNIQUE: Multidetector CT imaging of the abdomen and pelvis was performed using the standard protocol following bolus administration of intravenous contrast. CONTRAST:  24mL OMNIPAQUE IOHEXOL 300 MG/ML  SOLN COMPARISON:  06/26/2020 and previous FINDINGS: Lower chest: Trace pleural effusions. Hepatobiliary: Multiple liver lesions. 3.2 cm segment 2 lesion  shows definite progression since 05/27/2020, although is more wedge-shaped and may represent focal infarct. Multiple low-attenuation lesions in the right lobe have not convincingly changed allowing for differences in scan timing. There are nonspecific transient hepatic attenuation differences on the portal venous phase which resolve on delayed venous phase imaging. No biliary ductal dilatation. Gallbladder unremarkable. Pancreas: Unremarkable. No pancreatic ductal dilatation or surrounding inflammatory changes. Spleen: Normal in size. There peripheral areas of decreased enhancement which were not present on prior study of 05/27/2020, however this area was not included on the delayed scan, and may represent transient attenuation differences versus true lesions. Adrenals/Urinary Tract: Adrenal glands unremarkable. Marked atrophy of native kidneys. Normal enhancement of left iliac fossa transplant kidney. Coarse calcifications and low-attenuation in the right iliac fossa presumably related to remote transplant. The urinary bladder is incompletely distended. Stomach/Bowel: Stomach is decompressed. Small bowel is nondilated. The colon is incompletely distended, unremarkable. Vascular/Lymphatic: Bulky bilateral inguinal, left external iliac, bilateral common iliac, left para-aortic and aortocaval adenopathy as before. The larger nodes contain scattered calcifications and some with central low-attenuation regions as before. Moderate atheromatous aortoiliac plaque without aneurysm. Reproductive: Uterine fibroid.  No adnexal mass. Other: Small volume perihepatic and pelvic ascites slightly increased. No free air. Musculoskeletal: Poorly marginated sclerotic lesions in T12, L1, and L2 vertebral bodies, progressive since 05/16/2020. benign left supra-acetabular bone island stable since 02/03/2015. IMPRESSION: 1. No definite source of sepsis identified. 2. Extensive pelvic and retroperitoneal nodal; hepatic; and osseous  metastatic disease. 3. Slight increase in small volume perihepatic and pelvic ascites. Aortic Atherosclerosis (ICD10-I70.0). Electronically Signed   By: Lucrezia Europe M.D.   On: 07/01/2020 14:23   DG CHEST PORT 1 VIEW  Result Date: 07/11/2020 CLINICAL DATA:  Shortness of breath EXAM: PORTABLE CHEST 1 VIEW COMPARISON:  07/10/2020 FINDINGS: Hazy opacity of the left more than right lower chest. No visible effusion or pneumothorax. Normal heart size and mediastinal contours. IMPRESSION: Stable low volume chest with infiltrates or atelectasis at the bases. Electronically Signed   By: Monte Fantasia M.D.   On: 07/11/2020 07:39   DG CHEST PORT 1 VIEW  Result Date: 07/10/2020 CLINICAL DATA:  Liver carcinoma.  Hypertension. EXAM: PORTABLE CHEST 1 VIEW COMPARISON:  June 26, 2020 FINDINGS: There is consolidation in the left lower lobe with small left pleural effusion. There is ill-defined airspace opacity in the right base. Heart size and pulmonary vascularity are normal. No adenopathy. There is aortic atherosclerosis. No bone lesions. IMPRESSION: Consolidation left lower lobe with small left pleural effusion. Patchy infiltrate right base. Heart size within normal limits. No adenopathy evident. Aortic Atherosclerosis (ICD10-I70.0). Electronically Signed   By: Lowella Grip III M.D.   On: 07/10/2020 13:48   DG Chest Port 1 View  Result Date: 06/26/2020 CLINICAL DATA:  Weakness EXAM: PORTABLE CHEST 1 VIEW COMPARISON:  05/26/2020 FINDINGS: The heart size and mediastinal contours are within normal limits. Both lungs are clear. No pleural effusion or pneumothorax. The visualized skeletal structures are unremarkable. IMPRESSION: No acute process in the chest. Electronically Signed   By: Macy Mis M.D.   On: 06/26/2020 13:45   DG Abd Portable 2V  Result Date: 07/08/2020 CLINICAL DATA:  Nausea  and vomiting. EXAM: PORTABLE ABDOMEN - 2 VIEW COMPARISON:  07/01/2020 FINDINGS: Gas distended transverse colon but  no overt obstructive pattern. No abnormal stool retention or evidence of small bowel obstruction. Clear lung bases. Calcification in the right iliac fossa from failed transplant based on CT. IMPRESSION: Normal bowel gas pattern.  No abnormal stool retention. Electronically Signed   By: Monte Fantasia M.D.   On: 07/08/2020 11:18   ECHOCARDIOGRAM COMPLETE  Result Date: 07/06/2020    ECHOCARDIOGRAM REPORT   Patient Name:   ARDELLA CHHIM Date of Exam: 07/06/2020 Medical Rec #:  [id number]           Height:       67.0 in Accession #:    [id number]          Weight:       126.3 lb Date of Birth:  07-Mar-1976            BSA:          1.664 m Patient Age:    59 years            BP:           128/88 mmHg Patient Gender: F                   HR:           131 bpm. Exam Location:  Inpatient Procedure: 2D Echo, Cardiac Doppler and Color Doppler Indications:    R00.2 Palpitations  History:        Patient has no prior history of Echocardiogram examinations.                 Risk Factors:Hypertension. GERD. ESRD.  Sonographer:    Jonelle Sidle Dance Referring Phys: [place]  1. Left ventricular ejection fraction, by estimation, is >75%. The left ventricle has hyperdynamic function. The left ventricle has no regional wall motion abnormalities. Left ventricular diastolic parameters are indeterminate.  2. Right ventricular systolic function is normal. The right ventricular size is normal. There is normal pulmonary artery systolic pressure.  3. The mitral valve is normal in structure. No evidence of mitral valve regurgitation. No evidence of mitral stenosis.  4. The aortic valve is tricuspid. Aortic valve regurgitation is not visualized. No aortic stenosis is present.  5. The inferior vena cava is normal in size with greater than 50% respiratory variability, suggesting right atrial pressure of 3 mmHg. FINDINGS  Left Ventricle: Left ventricular ejection fraction, by estimation, is >75%. The left ventricle has  hyperdynamic function. The left ventricle has no regional wall motion abnormalities. The left ventricular internal cavity size was normal in size. There is no left ventricular hypertrophy. Left ventricular diastolic parameters are indeterminate. Right Ventricle: The right ventricular size is normal.Right ventricular systolic function is normal. There is normal pulmonary artery systolic pressure. The tricuspid regurgitant velocity is 2.50 m/s, and with an assumed right atrial pressure of 3 mmHg, the estimated right ventricular systolic pressure is 58.5 mmHg. Left Atrium: Left atrial size was normal in size. Right Atrium: Right atrial size was normal in size. Pericardium: Trivial pericardial effusion is present. Mitral Valve: The mitral valve is normal in structure. No evidence of mitral valve regurgitation. No evidence of mitral valve stenosis. Tricuspid Valve: The tricuspid valve is normal in structure. Tricuspid valve regurgitation is mild . No evidence of tricuspid stenosis. Aortic Valve: The aortic valve is tricuspid. Aortic valve regurgitation is not visualized. No aortic stenosis is present. Pulmonic Valve:  The pulmonic valve was normal in structure. Pulmonic valve regurgitation is not visualized. No evidence of pulmonic stenosis. Aorta: The aortic root is normal in size and structure. Venous: The inferior vena cava is normal in size with greater than 50% respiratory variability, suggesting right atrial pressure of 3 mmHg.  LEFT VENTRICLE PLAX 2D LVIDd:         3.30 cm LVIDs:         2.50 cm LV PW:         0.90 cm LV IVS:        1.00 cm LVOT diam:     1.90 cm LV SV:         51 LV SV Index:   31 LVOT Area:     2.84 cm  RIGHT VENTRICLE             IVC RV Basal diam:  2.00 cm     IVC diam: 0.80 cm RV S prime:     15.90 cm/s TAPSE (M-mode): 1.9 cm LEFT ATRIUM             Index       RIGHT ATRIUM          Index LA diam:        2.90 cm 1.74 cm/m  RA Area:     6.11 cm LA Vol (A2C):   26.9 ml 16.17 ml/m RA Volume:    8.36 ml  5.03 ml/m LA Vol (A4C):   10.0 ml 6.00 ml/m LA Biplane Vol: 16.5 ml 9.92 ml/m  AORTIC VALVE LVOT Vmax:   112.00 cm/s LVOT Vmean:  78.300 cm/s LVOT VTI:    0.181 m  AORTA Ao Root diam: 2.80 cm Ao Asc diam:  2.90 cm MITRAL VALVE               TRICUSPID VALVE MV Area (PHT): 4.85 cm    TR Peak grad:   25.0 mmHg MV Decel Time: 157 msec    TR Vmax:        250.00 cm/s MV E velocity: 98.00 cm/s                            SHUNTS                            Systemic VTI:  0.18 m                            Systemic Diam: 1.90 cm Kirk Ruths MD Electronically signed by Kirk Ruths MD Signature Date/Time: 07/06/2020/2:34:19 PM    Final    VAS [place] LOWER EXTREMITY VENOUS (DVT)  Result Date: 07/09/2020  Lower Venous DVT Study Indications: Pain in thighs bilaterally.  Risk Factors: Cancer Metastatic carcinoma to liver, hx of RCC. Comparison Study: No prior studies. Performing Technologist: Darlin Coco, RDMS  Examination Guidelines: A complete evaluation includes B-mode imaging, spectral Doppler, color Doppler, and power Doppler as needed of all accessible portions of each vessel. Bilateral testing is considered an integral part of a complete examination. Limited examinations for reoccurring indications may be performed as noted. The reflux portion of the exam is performed with the patient in reverse Trendelenburg.  +---------+---------------+---------+-----------+----------+--------------+ RIGHT    CompressibilityPhasicitySpontaneityPropertiesThrombus Aging +---------+---------------+---------+-----------+----------+--------------+ CFV      Full           Yes  Yes                                 +---------+---------------+---------+-----------+----------+--------------+ SFJ      Full                                                        +---------+---------------+---------+-----------+----------+--------------+ FV Prox  Full                                                         +---------+---------------+---------+-----------+----------+--------------+ FV Mid   Full                                                        +---------+---------------+---------+-----------+----------+--------------+ FV DistalFull                                                        +---------+---------------+---------+-----------+----------+--------------+ PFV      Full                                                        +---------+---------------+---------+-----------+----------+--------------+ POP      Full           Yes      Yes                                 +---------+---------------+---------+-----------+----------+--------------+ PTV      Full                                                        +---------+---------------+---------+-----------+----------+--------------+ PERO     Full                                                        +---------+---------------+---------+-----------+----------+--------------+   +---------+---------------+---------+-----------+----------+--------------+ LEFT     CompressibilityPhasicitySpontaneityPropertiesThrombus Aging +---------+---------------+---------+-----------+----------+--------------+ CFV      Full           Yes      Yes                                 +---------+---------------+---------+-----------+----------+--------------+ SFJ      Full                                                        +---------+---------------+---------+-----------+----------+--------------+  FV Prox  Full                                                        +---------+---------------+---------+-----------+----------+--------------+ FV Mid   Full                                                        +---------+---------------+---------+-----------+----------+--------------+ FV DistalFull                                                         +---------+---------------+---------+-----------+----------+--------------+ PFV      Full                                                        +---------+---------------+---------+-----------+----------+--------------+ POP      Full           Yes      Yes                                 +---------+---------------+---------+-----------+----------+--------------+ PTV      Full                                                        +---------+---------------+---------+-----------+----------+--------------+ PERO     Full                                                        +---------+---------------+---------+-----------+----------+--------------+     Summary: RIGHT: - There is no evidence of deep vein thrombosis in the lower extremity.  - No cystic structure found in the popliteal fossa. - Ultrasound characteristics of enlarged lymph nodes are noted in the groin.  LEFT: - There is no evidence of deep vein thrombosis in the lower extremity.  - No cystic structure found in the popliteal fossa. - Ultrasound characteristics of enlarged lymph nodes noted in the groin.  *See table(s) above for measurements and observations. Electronically signed by Curt Jews MD on 07/09/2020 at 4:37:48 PM.    Final    US Abdomen Limited RUQ (LIVER/GB)  Result Date: 06/27/2020 CLINICAL DATA:  Abdominal pain.  Known liver lesions. EXAM: ULTRASOUND ABDOMEN LIMITED RIGHT UPPER QUADRANT COMPARISON:  Noncontrast abdominal CT yesterday. Contrast-enhanced abdominal CT 05/27/2020 FINDINGS: Gallbladder: Physiologically distended containing intraluminal sludge. No shadowing stones. No wall thickening. No sonographic Murphy sign noted by sonographer. Common bile duct: Diameter: 4-5 mm. Liver: Heterogeneous liver. Many of the liver lesions on prior contrast-enhanced  CT are not well seen by ultrasound. There is a 1.8 x 1.5 x 1.5 cm complex hypoechoic lesion in the left lobe. No convincing subcapsular collection is  suggested on CT. Portal vein is patent on color Doppler imaging with normal direction of blood flow towards the liver. Other: No right upper quadrant ascites. IMPRESSION: 1. Gallbladder sludge. No gallstones or sonographic findings of acute cholecystitis. 2. No biliary dilatation. 3. Heterogeneous liver with 1.8 cm hypoechoic lesion in the left lobe of the liver. Many of the additional liver lesions on prior contrast-enhanced CT are not well seen by ultrasound. 4. No sonographic evidence of subcapsular collection is suggested on CT. Electronically Signed   By: Keith Rake M.D.   On: 06/27/2020 17:44    ASSESSMENT: 44 yo with   1) Metastatic renal papillary cell carcinoma. Extensive metastases to abdominal pelvic lymph nodes, bones and liver.  Patient with h/o renal transplantation on tacrolimus, prednisone and cellcept chronic immunosuppression with extensive metastatic disease with extensive abd/pelvic LNadenopathy and hepatic metastases.  IHC from LN and liver metastases -- non specific morphology and IC ? Gyn vs renal tubular origin. Tumor markers unrevealing However tissue profiling with cancer type ZD-66% certainty of papillary renal cell carcinoma.;  Patient received 1 cycle of carbotaxol and has been switched to Cabometyx. Component     Latest Ref Rng & Units 05/29/2020 05/31/2020  CEA     0.0 - 4.7 ng/mL 0.9   AFP, Serum, Tumor Marker     0.0 - 8.3 ng/mL 1.7   CA 19-9     0 - 35 U/mL 24   Cancer Antigen (CA) 125     0.0 - 38.1 U/mL 20.1   hCG Quant     mIU/mL 3   LDH     98 - 192 U/L  178    2) severe systemic inflammatory response syndrome  3) transaminitis and hyperbilirubinemia  4) AKI with hyperkalemia, improved    PLAN: -Has developed worsening confusion.  Infectious work-up in process including blood cultures and chest x-ray.  Ammonia level last evening was normal.  CT scan of the head showed no acute findings. -Recommend stopping Marinol and other  medications that can cause sedation/confusion. -Cabometyx dose was increased to 40 mg earlier this week.  Due to new confusion, will decrease dose back to 20 mg daily. -Continue control of her nausea. vomiting, abdominal pain.  Recommend increasing p.o. intake and dietitian consult. -MRI of the brain has been obtained which was negative for metastatic disease to the brain.  It did show a 1 cm enhancing lesion at the C2 vertebral body compatible with metastatic disease.  Hospitalist plans to obtain a repeat MRI of the brain due to new confusion. -We will plan to start the patient on Zometa when stable and out of the hospital for metastatic bone disease. -Her case was discussed with her renal transplant physician Aquilla Hacker at [facility] at [phone].  Discussed to determine options for reducing her immune suppression.  He noted there might be advantages in trying to determine if renal cell cancer is often donor kidney origin versus native kidney to determine potential effectiveness of backing off on immune suppression. -I discussed with the patient that there is an important role of backing off on immune suppression to try to help her body fight of the cancer.  The downside risk would be that she could have renal function loss from rejection and may land up on dialysis. -Dr Altamease Oiler will be discussing with  his pathologist at [facility] to know what testing to send out to determine donor versus native kidney origin of her renal cell carcinoma. -Tacrolimus level resulted at 10.4.  Hospitalist contacted nephrology at [facility] who wishes to continue CellCept for now. -Oncology will continue to follow -Transfuse as needed for hemoglobin less than 8.     LOS: 15 days    ADDENDUM  .Patient was Personally and independently interviewed, examined and relevant elements of the history of present illness were reviewed in details and an assessment and plan was created. All elements of the patient's history of present illness ,  assessment and plan were discussed in details with Mikey Bussing NP. The above documentation reflects our combined findings assessment and plan.  Sullivan Lone MD MS

## 2020-07-12 ENCOUNTER — Inpatient Hospital Stay (HOSPITAL_COMMUNITY): Payer: Medicare Other

## 2020-07-12 DIAGNOSIS — D649 Anemia, unspecified: Secondary | ICD-10-CM | POA: Diagnosis not present

## 2020-07-12 DIAGNOSIS — R1084 Generalized abdominal pain: Secondary | ICD-10-CM | POA: Diagnosis not present

## 2020-07-12 DIAGNOSIS — R638 Other symptoms and signs concerning food and fluid intake: Secondary | ICD-10-CM

## 2020-07-12 DIAGNOSIS — R0600 Dyspnea, unspecified: Secondary | ICD-10-CM | POA: Diagnosis not present

## 2020-07-12 DIAGNOSIS — D630 Anemia in neoplastic disease: Secondary | ICD-10-CM | POA: Diagnosis not present

## 2020-07-12 DIAGNOSIS — C787 Secondary malignant neoplasm of liver and intrahepatic bile duct: Secondary | ICD-10-CM | POA: Diagnosis not present

## 2020-07-12 DIAGNOSIS — C7951 Secondary malignant neoplasm of bone: Secondary | ICD-10-CM

## 2020-07-12 DIAGNOSIS — C649 Malignant neoplasm of unspecified kidney, except renal pelvis: Secondary | ICD-10-CM | POA: Diagnosis not present

## 2020-07-12 DIAGNOSIS — R109 Unspecified abdominal pain: Secondary | ICD-10-CM | POA: Diagnosis not present

## 2020-07-12 DIAGNOSIS — C775 Secondary and unspecified malignant neoplasm of intrapelvic lymph nodes: Secondary | ICD-10-CM

## 2020-07-12 DIAGNOSIS — N179 Acute kidney failure, unspecified: Secondary | ICD-10-CM | POA: Diagnosis not present

## 2020-07-12 DIAGNOSIS — J189 Pneumonia, unspecified organism: Secondary | ICD-10-CM

## 2020-07-12 DIAGNOSIS — D72829 Elevated white blood cell count, unspecified: Secondary | ICD-10-CM

## 2020-07-12 DIAGNOSIS — R1110 Vomiting, unspecified: Secondary | ICD-10-CM

## 2020-07-12 DIAGNOSIS — E440 Moderate protein-calorie malnutrition: Secondary | ICD-10-CM

## 2020-07-12 DIAGNOSIS — K859 Acute pancreatitis without necrosis or infection, unspecified: Secondary | ICD-10-CM | POA: Diagnosis not present

## 2020-07-12 LAB — COMPREHENSIVE METABOLIC PANEL
ALT: 29 U/L (ref 0–44)
AST: 55 U/L — ABNORMAL HIGH (ref 15–41)
Albumin: 1.7 g/dL — ABNORMAL LOW (ref 3.5–5.0)
Alkaline Phosphatase: 214 U/L — ABNORMAL HIGH (ref 38–126)
Anion gap: 10 (ref 5–15)
BUN: 21 mg/dL — ABNORMAL HIGH (ref 6–20)
CO2: 22 mmol/L (ref 22–32)
Calcium: 7.4 mg/dL — ABNORMAL LOW (ref 8.9–10.3)
Chloride: 105 mmol/L (ref 98–111)
Creatinine, Ser: 1.08 mg/dL — ABNORMAL HIGH (ref 0.44–1.00)
GFR, Estimated: >60 mL/min (ref >60)
Glucose, Bld: 146 mg/dL — ABNORMAL HIGH (ref 70–99)
Potassium: 4.8 mmol/L (ref 3.5–5.1)
Sodium: 137 mmol/L (ref 135–145)
Total Bilirubin: 5 mg/dL — ABNORMAL HIGH (ref 0.3–1.2)
Total Protein: 5.2 g/dL — ABNORMAL LOW (ref 6.5–8.1)

## 2020-07-12 LAB — PHOSPHORUS: Phosphorus: 3.1 mg/dL (ref 2.5–4.6)

## 2020-07-12 LAB — CBC WITH DIFFERENTIAL/PLATELET
Abs Immature Granulocytes: 0.29 10*3/uL — ABNORMAL HIGH (ref 0.00–0.07)
Basophils Absolute: 0 10*3/uL (ref 0.0–0.1)
Basophils Relative: 0 %
Eosinophils Absolute: 0 10*3/uL (ref 0.0–0.5)
Eosinophils Relative: 0 %
HCT: 21.6 % — ABNORMAL LOW (ref 36.0–46.0)
Hemoglobin: 7.1 g/dL — ABNORMAL LOW (ref 12.0–15.0)
Immature Granulocytes: 1 %
Lymphocytes Relative: 4 %
Lymphs Abs: 1.2 10*3/uL (ref 0.7–4.0)
MCH: 27.4 pg (ref 26.0–34.0)
MCHC: 32.9 g/dL (ref 30.0–36.0)
MCV: 83.4 fL (ref 80.0–100.0)
Monocytes Absolute: 0.9 10*3/uL (ref 0.1–1.0)
Monocytes Relative: 3 %
Neutro Abs: 26.9 10*3/uL — ABNORMAL HIGH (ref 1.7–7.7)
Neutrophils Relative %: 92 %
Platelets: 148 10*3/uL — ABNORMAL LOW (ref 150–400)
RBC: 2.59 MIL/uL — ABNORMAL LOW (ref 3.87–5.11)
RDW: 24.9 % — ABNORMAL HIGH (ref 11.5–15.5)
WBC: 29.3 10*3/uL — ABNORMAL HIGH (ref 4.0–10.5)
nRBC: 0.1 % (ref 0.0–0.2)

## 2020-07-12 LAB — GLUCOSE, CAPILLARY
Glucose-Capillary: 93 mg/dL (ref 70–99)
Glucose-Capillary: 86 mg/dL (ref 70–99)

## 2020-07-12 LAB — MAGNESIUM: Magnesium: 2.1 mg/dL (ref 1.7–2.4)

## 2020-07-12 LAB — LACTIC ACID, PLASMA: Lactic Acid, Venous: 1.1 mmol/L (ref 0.5–1.9)

## 2020-07-12 LAB — PROCALCITONIN: Procalcitonin: 8.78 ng/mL

## 2020-07-12 IMAGING — CT CT ABD-PELV W/O CM
2 of 4 series · 13 of 36 positions shown, 16 images · non-contrast
Comparison: CT of the chest abdomen pelvis dated [DATE].

CLINICAL DATA: 44-year-old female with nausea and vomiting.
Metastatic cancer of unknown primary.

EXAM:
CT CHEST, ABDOMEN AND PELVIS WITHOUT CONTRAST
TECHNIQUE: Multidetector CT imaging of the chest, abdomen and pelvis was
performed following the standard protocol without IV contrast.

[Series 2: cap w/o · axial · non-contrast · 0.71mm/px · z∈[-770,-220]mm · 10 of 130 slices shown, 13 images]
[im 10/130  mediastinal]
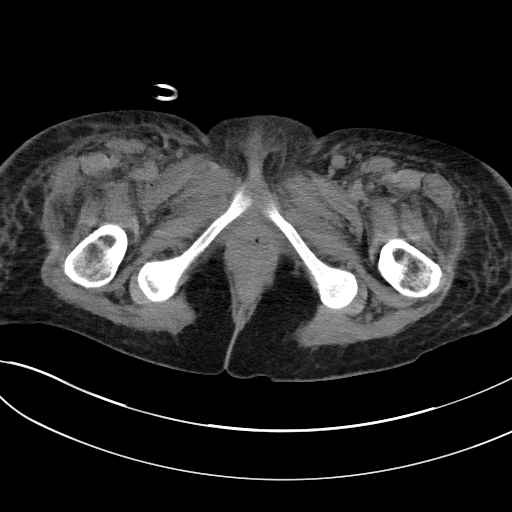
[im 10/130  lung]
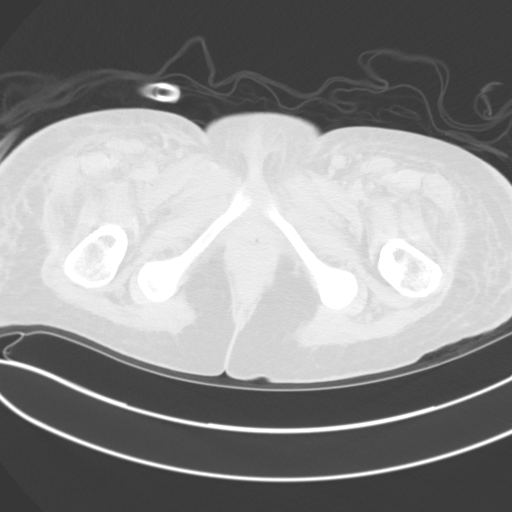
[im 20/130  lung]
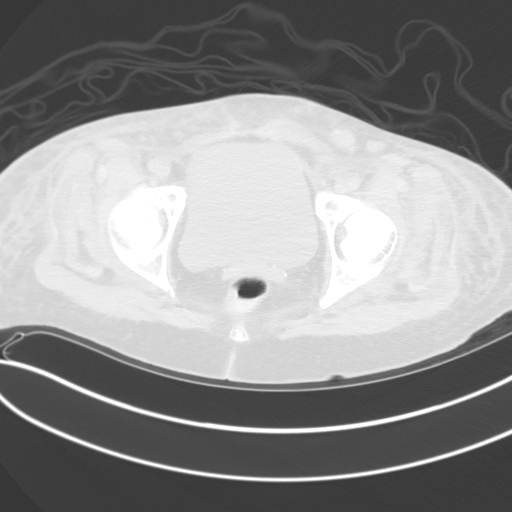
[im 40/130  lung]
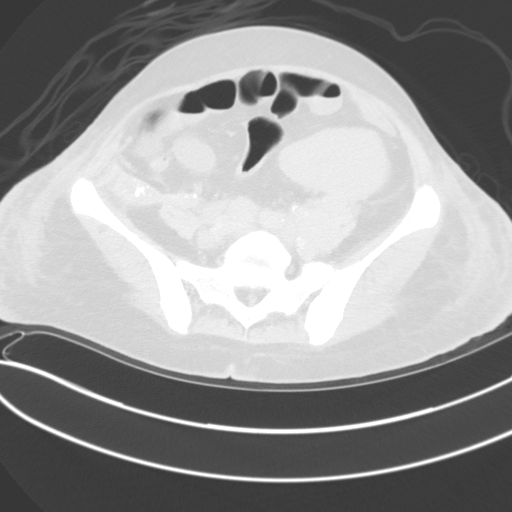
[im 50/130  lung]
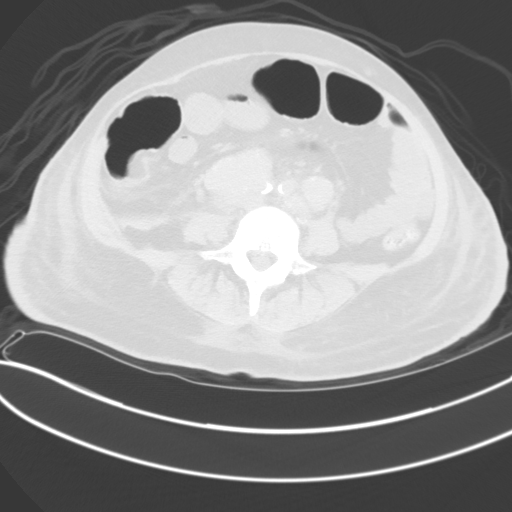
[im 60/130  mediastinal]
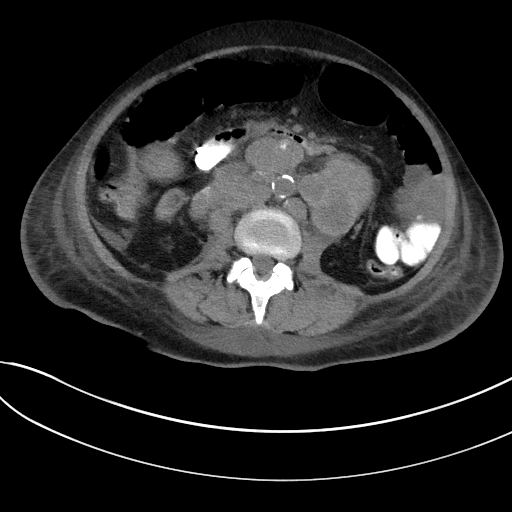
[im 60/130  lung]
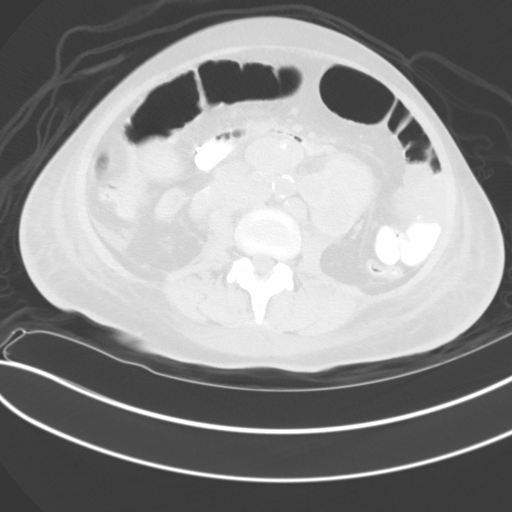
[im 70/130  lung]
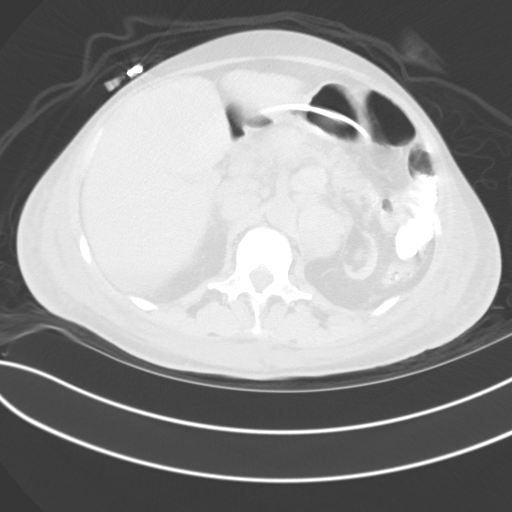
[im 80/130  lung]
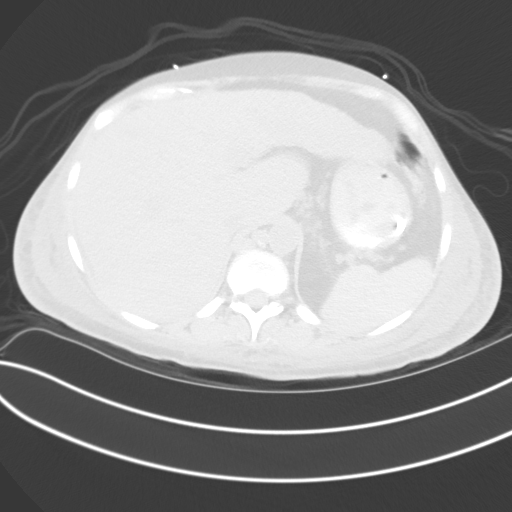
[im 100/130  lung]
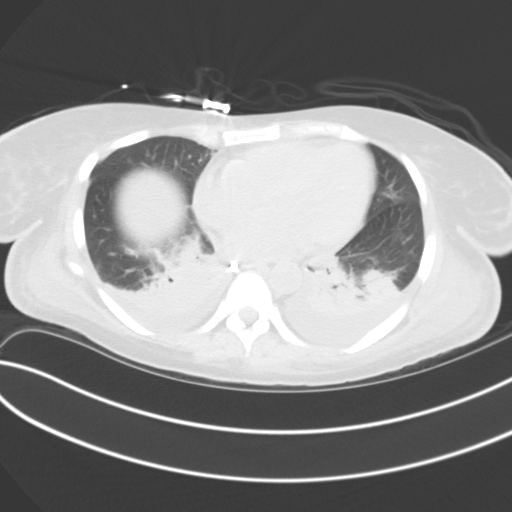
[im 110/130  mediastinal]
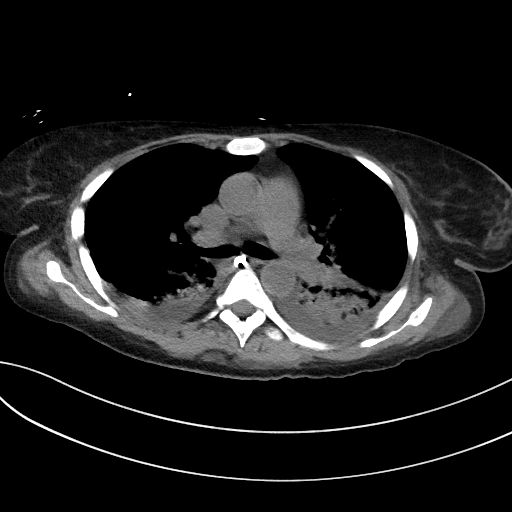
[im 110/130  lung]
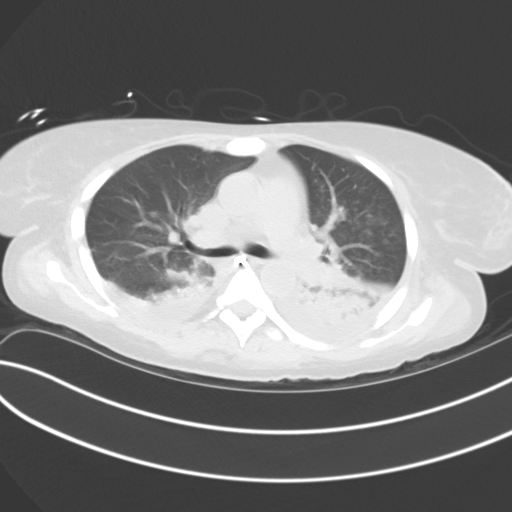
[im 120/130  lung]
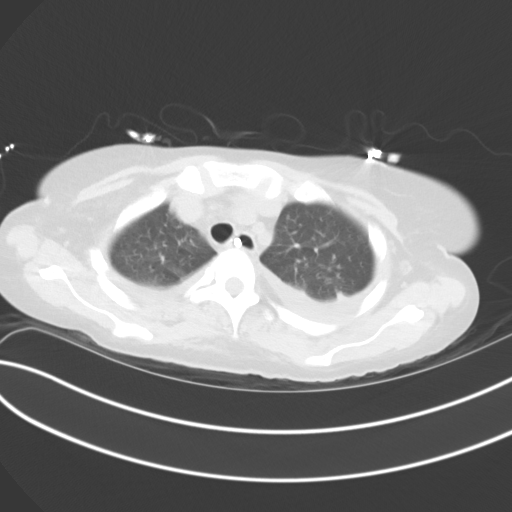

[Series 4: coronals · coronal · 1.03mm/px · 3 of 124 slices shown]
[im 25/124  lung]
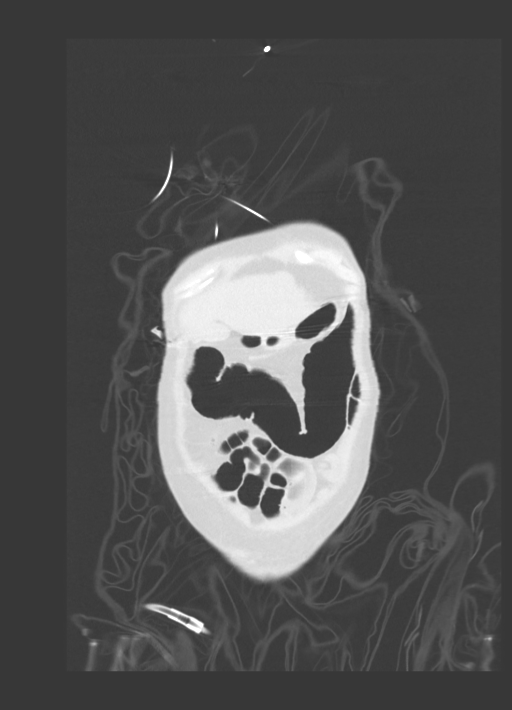
[im 50/124  lung]
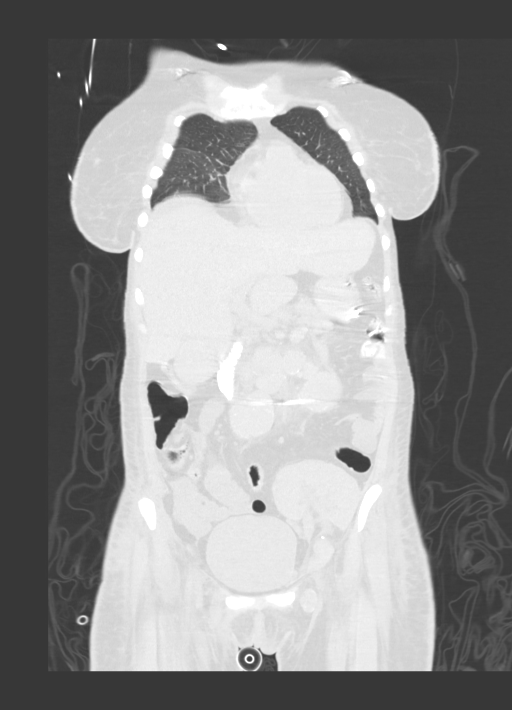
[im 74/124  lung]
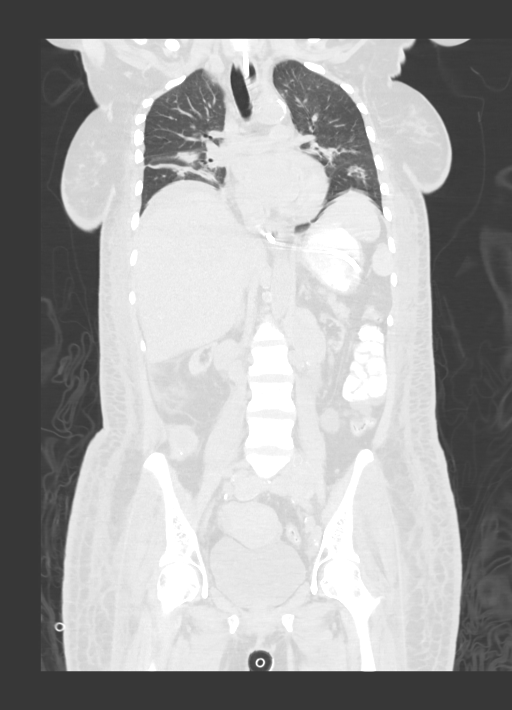

[13 of 36 positions shown; findings below may reference images not displayed]

FINDINGS: CT CHEST FINDINGS

Evaluation of this exam is limited in the absence of intravenous
contrast.

Cardiovascular: There is no cardiomegaly or pericardial effusion.
There is mild atherosclerotic calcification of the aortic arch. The
central pulmonary arteries are grossly unremarkable on this
noncontrast CT.

Mediastinum/Nodes: No definite hilar or mediastinal adenopathy.
Evaluation however is limited in the absence of intravenous contrast
as well as due to consolidative changes of the lungs. A feeding tube
noted within the esophagus. The thyroid gland is grossly
unremarkable. No mediastinal fluid collection.

Lungs/Pleura: Small bilateral pleural effusions. There are large
areas of consolidation involving the lower lobes as well as smaller
areas of patchy ground-glass density involving the right middle
lobe, right upper lobe, and lingula most consistent with multifocal
pneumonia. Aspiration is not excluded clinical correlation is
recommended. There is no pneumothorax. The central airways are
patent.

Musculoskeletal: No chest wall mass or suspicious bone lesions
identified.

CT ABDOMEN PELVIS FINDINGS

No intra-abdominal free air. Small perihepatic free fluid.

Hepatobiliary: Several faint hepatic hypodense lesions consistent
with metastatic disease. These were better seen on the contrast
enhanced CT of [DATE]. no intrahepatic biliary dilatation. High
attenuating content within the gallbladder may represent sludge or
vicarious excretion of recently administered contrast.

Pancreas: The pancreas is grossly unremarkable.

Spleen: Normal in size without focal abnormality.

Adrenals/Urinary Tract: The adrenal glands are suboptimally
visualized. Severe bilateral renal atrophy. There is a left lower
quadrant renal transplant. There is no hydronephrosis or
nephrolithiasis of the transplant kidney. No peritransplant fluid
collection. There is duplicated appearance of the renal transplant
collecting system. The urinary bladder is unremarkable.

Stomach/Bowel: A feeding tube with tip in the distal duodenum. There
is no bowel obstruction. The appendix is normal.

Vascular/Lymphatic: Moderate aortoiliac atherosclerotic disease. No
portal venous gas. Large and bulky retroperitoneal adenopathy as
well as bilateral inguinal adenopathy. Large adenopathy along the
left iliac chain.

Reproductive: The uterus is grossly unremarkable.

Other: There is a 3.8 x 2.7 cm low attenuating collection with areas
of calcification along the right pelvic sidewall similar to prior
CT. This is not well characterized but may related to an old
hematoma or necrotic collection.

Musculoskeletal: There is diffuse subcutaneous edema and anasarca,
new or worsened since the prior CT. Sclerotic osseous metastatic
disease predominantly involving T12, L1, L2 as seen previously.
Sclerotic metastatic involvement of the right pubic bone. No acute
osseous pathology.
IMPRESSION: 1. Multifocal pneumonia. Aspiration is not excluded. Clinical
correlation is recommended.
2. Small bilateral pleural effusions, new or worsened since the
prior CT.
3. Extensive retroperitoneal nodal metastatic disease as well as
hepatic and osseous metastatic disease as seen previously. The liver
lesions are better seen on the prior contrast enhanced CT.
4. Atrophic native kidneys with a Left lower quadrant renal
transplant. No hydronephrosis or nephrolithiasis.
5. Diffuse subcutaneous edema and anasarca, new or worsened since
the prior CT.
6. Aortic Atherosclerosis ([IA]-[IA]).

## 2020-07-12 IMAGING — DX DG CHEST 1V PORT
1 series · 1 of 1 positions shown · non-contrast
Comparison: [DATE]

CLINICAL DATA: Dyspnea

EXAM:
PORTABLE CHEST 1 VIEW

[chest ap]
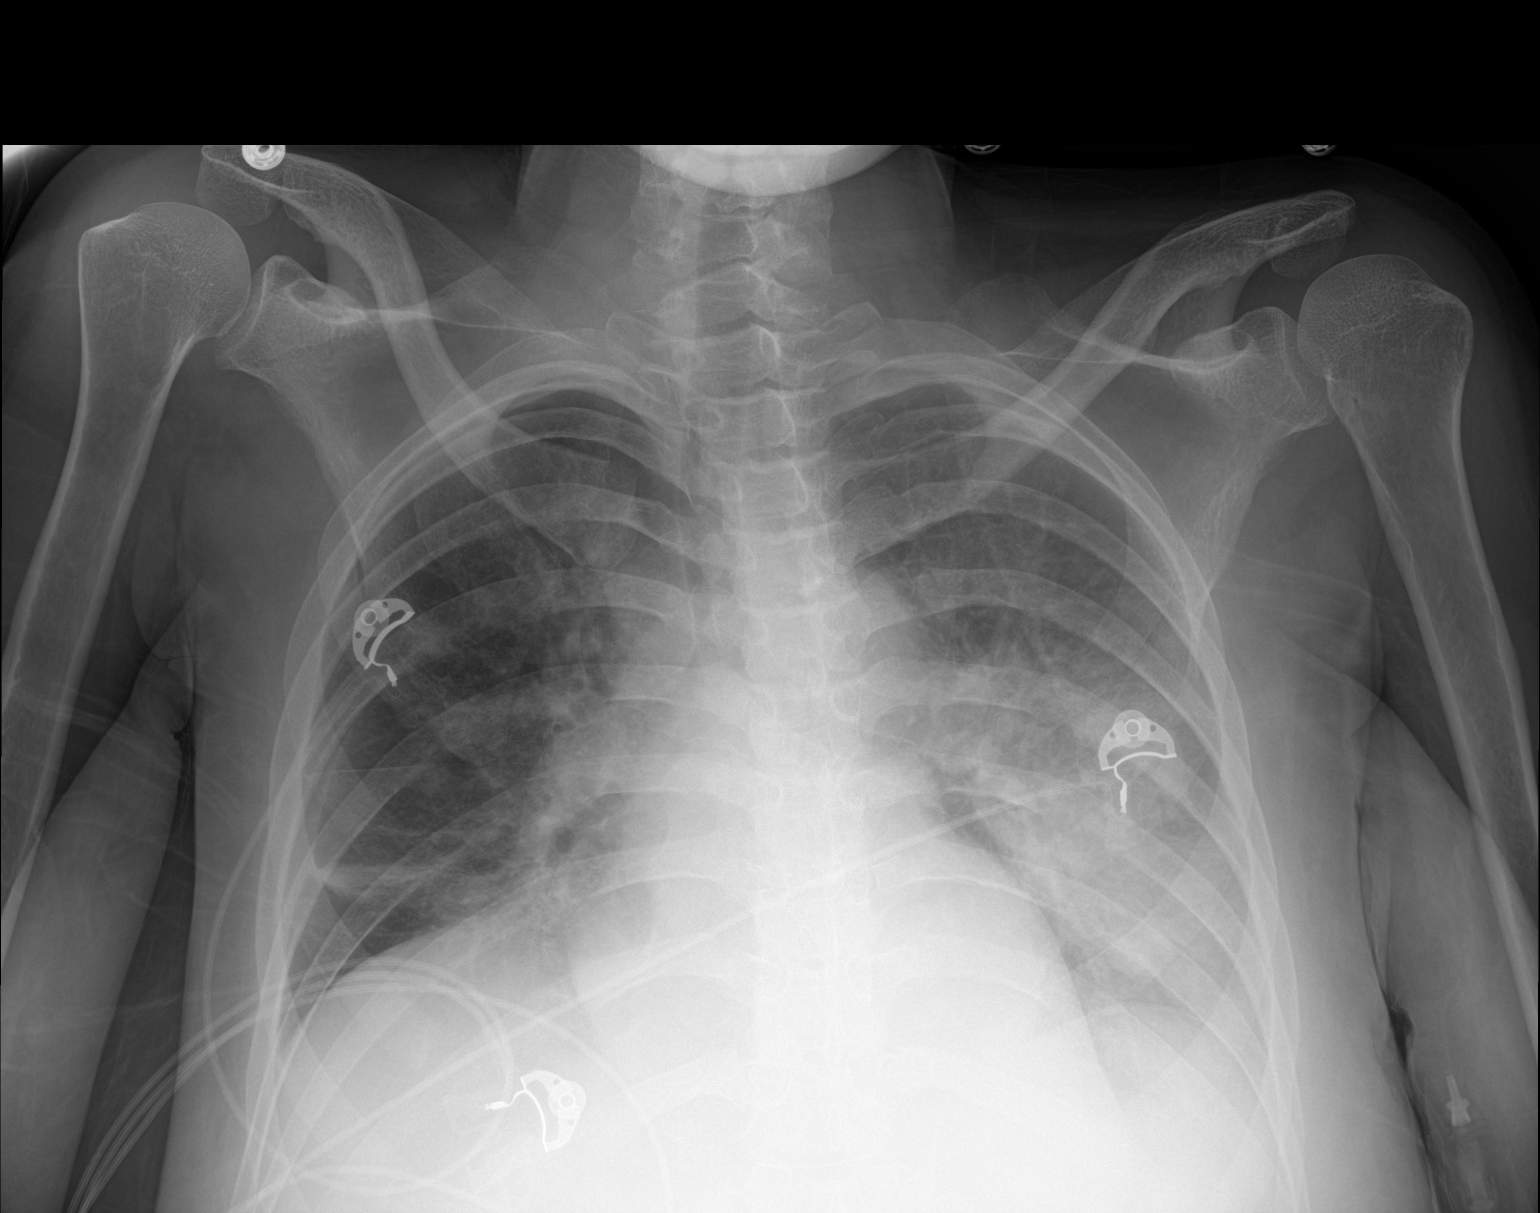

[1 of 1 positions shown; findings below may reference images not displayed]

FINDINGS: Supine chest radiograph.

Lung volumes are small and pulmonary insufflation has diminished
since prior examination. There is developing consolidation within
the left mid lung zone, likely infectious in the acute setting.
Linear atelectasis within the right mid lung zone. No pneumothorax
or pleural effusion. Cardiac size within normal limits. Pulmonary
vascularity is normal.
IMPRESSION: Left mid lung zone consolidation, likely infectious.

## 2020-07-12 IMAGING — MR MR LUMBAR SPINE W/O CM
4 of 5 series · 28 of 48 positions shown · non-contrast
Comparison: Prior CT from earlier the same day.

CLINICAL DATA: Initial evaluation for severe lower back pain and
pelvic pain, history of renal transplant with metastatic carcinoma.

EXAM:
MRI LUMBAR SPINE WITHOUT CONTRAST
MRI PELVIS WITHOUT CONTRAST
TECHNIQUE: Multiplanar, multisequence MR imaging of the lumbar spine was
performed. No intravenous contrast was administered.

[Series 5: T2 · sagittal · 4.0mm · 0.72mm/px · 6 of 17 slices shown (1 of 2)]
[im 1/17]
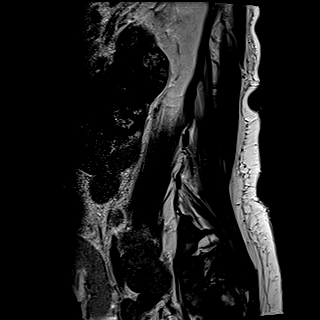
[im 4/17]
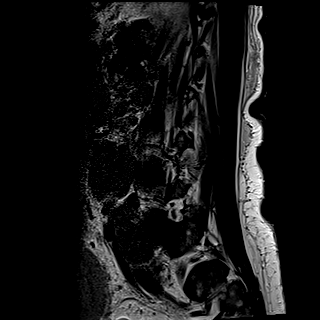
[im 7/17]
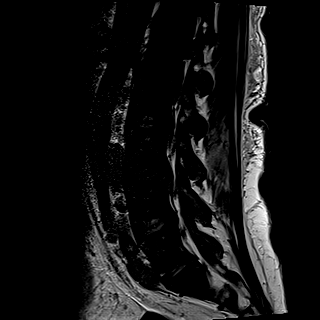
[im 10/17]
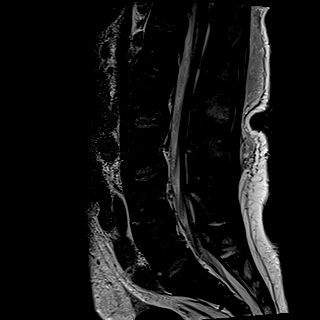
[im 13/17]
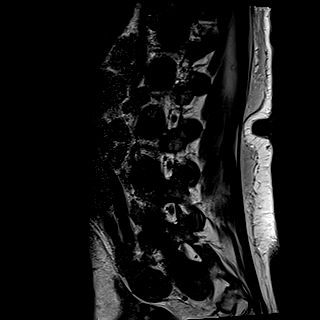
[im 17/17]
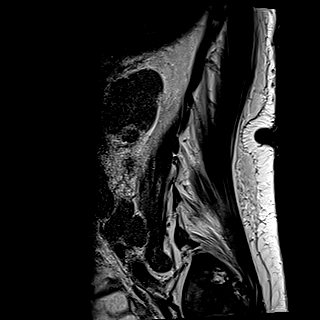

[Series 6: T1 · sagittal · 4.0mm · 0.72mm/px · 7 of 17 slices shown (1 of 2)]
[im 1/17]
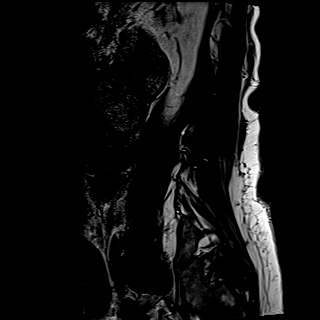
[im 3/17]
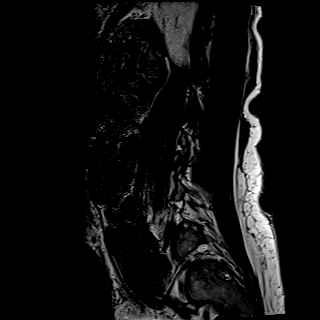
[im 6/17]
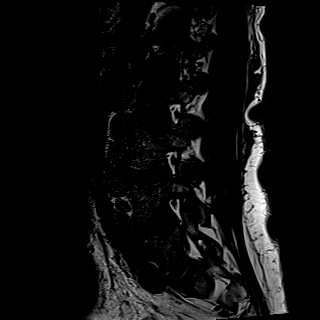
[im 9/17]
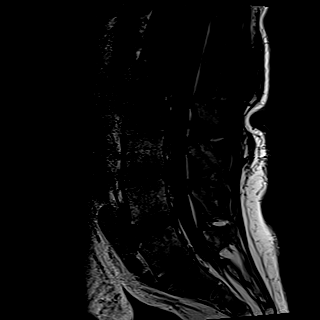
[im 11/17]
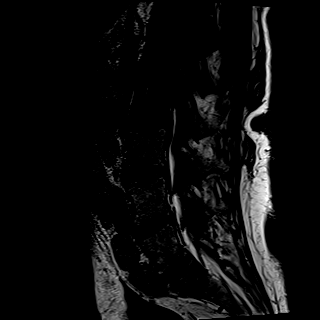
[im 14/17]
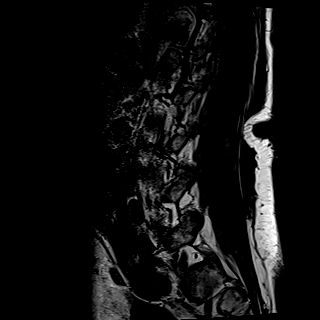
[im 17/17]
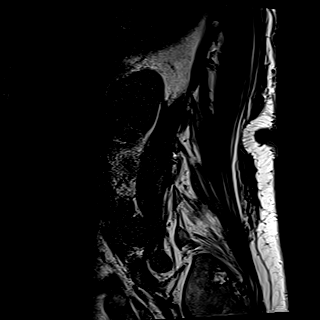

[Series 8: T2 · axial · 4.0mm · 0.62mm/px · z∈[-54,+126]mm · 8 of 34 slices shown (2 of 2)]
[im 1/34]
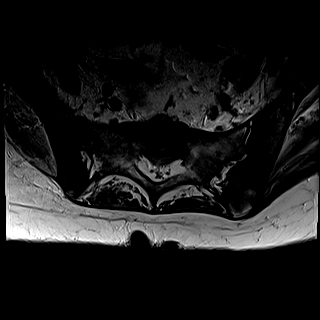
[im 6/34]
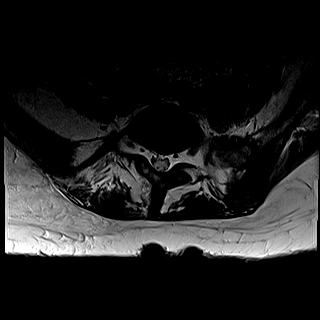
[im 11/34]
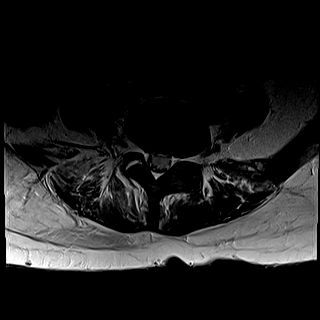
[im 16/34]
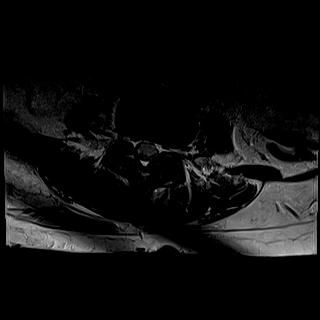
[im 18/34]
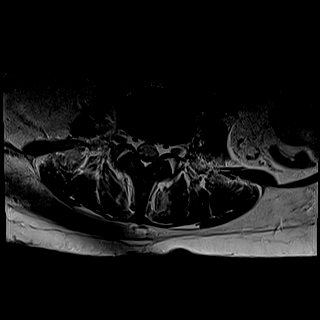
[im 23/34]
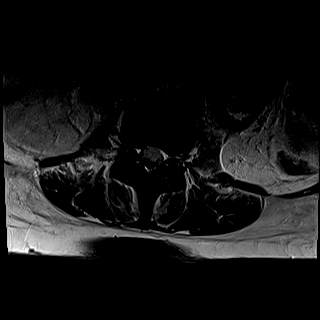
[im 28/34]
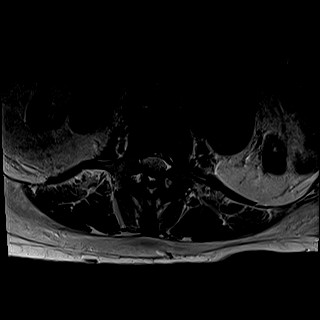
[im 34/34]
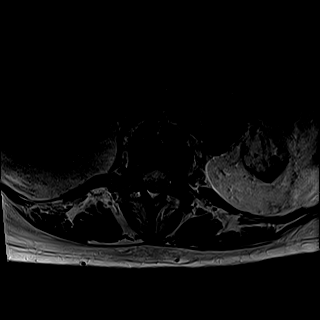

[Series 9: T1 · axial · 4.0mm · 0.39mm/px · z∈[-54,+96]mm · 7 of 34 slices shown (2 of 2)]
[im 1/34]
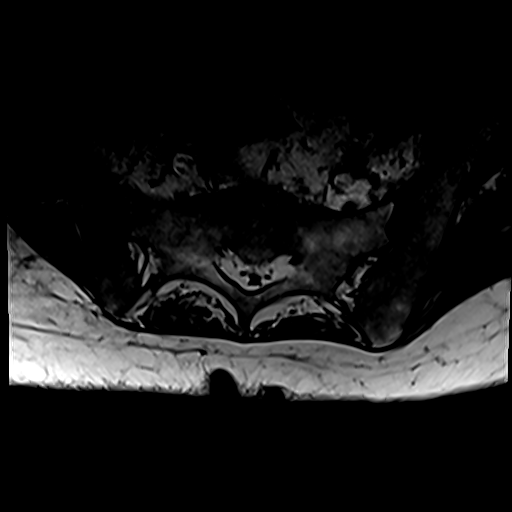
[im 6/34]
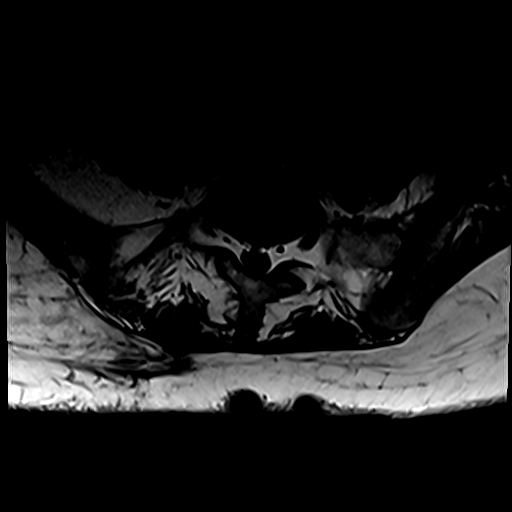
[im 11/34]
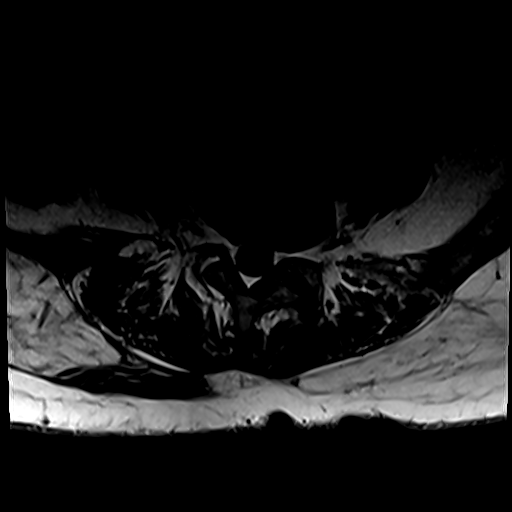
[im 16/34]
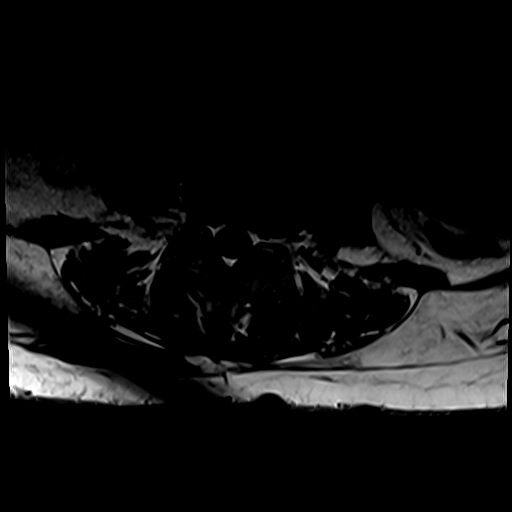
[im 18/34]
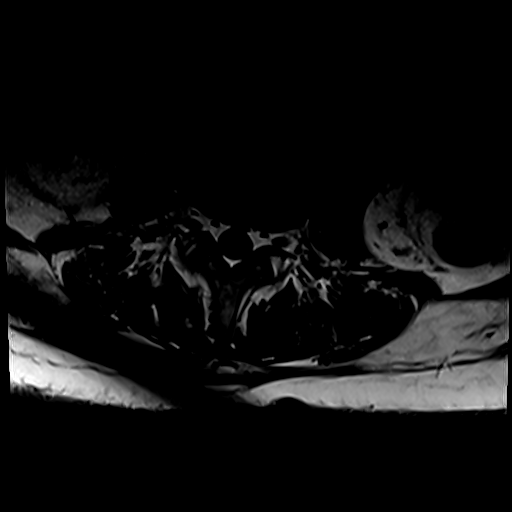
[im 23/34]
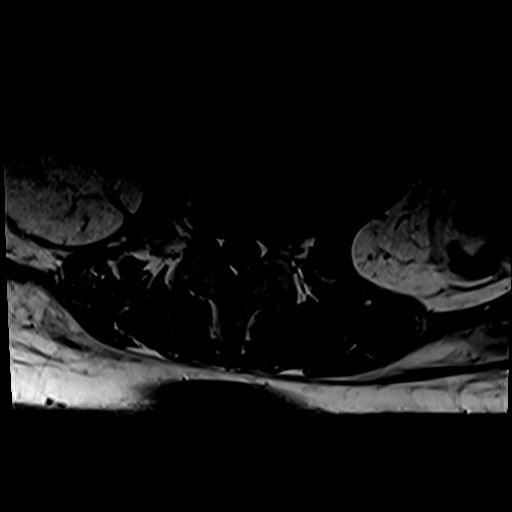
[im 28/34]
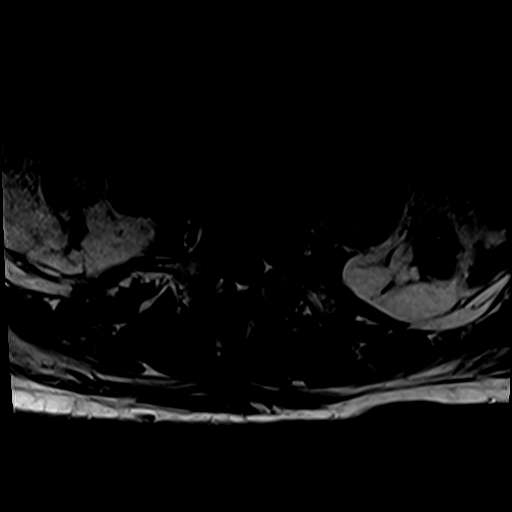

[28 of 48 positions shown; findings below may reference images not displayed]

FINDINGS: MRI LUMBAR SPINE FINDINGS:

Segmentation: Standard. Lowest well-formed disc space labeled the
L5-S1 level.

Alignment: Physiologic with preservation of the normal lumbar
lordosis. No listhesis or static subluxation.

Vertebrae: Abnormal T1 hypointense, STIR hyperintense lesion seen
involving the T12, L1, and L2 vertebral bodies, consistent with
osseous metastatic disease. Involvement of these levels is fairly
diffuse and infiltrative in nature. Heterogeneous marrow signal
elsewhere within the visualized lumbosacral spine without definite
discrete metastasis. No associated pathologic fracture. No extra
osseous extension or epidural extension of tumor.

Conus medullaris and cauda equina: Conus extends to the L1 level.
Conus and cauda equina appear normal.

Paraspinal and other soft tissues: Extensive bulky retroperitoneal
and iliac adenopathy, better seen on recent CT. Associated reactive
marrow edema within the posterior paraspinous and psoas musculature.
Marked atrophy of the native kidneys noted. Renal transplant within
the left iliac fossa partially visualized.

Disc levels:

No significant disc pathology seen within the lumbar spine.
Intervertebral discs are well hydrated with preserved disc height.
No significant disc bulge or focal disc herniation. Minimal
multilevel facet hypertrophy noted throughout the lumbar spine.
Epidural lipomatosis noted within the lower lumbar spine. No
significant spinal stenosis. Foramina remain patent. No impingement.

MRI PELVIS FINDINGS:

Examination is technically limited as the patient was unable to
tolerate the full length of the examination, with the exam
terminated early. Coronal T1 weighted sequence only was performed.

Visualized bowels normal in caliber without evidence for
obstruction. Normal appendix.

Marked atrophy of the native kidneys with a renal transplant within
the left iliac fossa. No appreciable hydronephrosis or other obvious
abnormality about the renal transplant on this limited exam. Urinary
bladder within normal limits.

Uterus within normal limits. Few nabothian cysts noted about the
cervix. Ovaries not well evaluated on this limited study. No
appreciable adnexal mass.

Normal flow voids seen throughout the visualized abdomen and pelvis.

Extensive bulky retroperitoneal and iliac adenopathy again seen,
better evaluated on dedicated CT from earlier the same day. For
reference purposes, the largest nodal conglomerate seen within the
lower right periaortic region and measures 5.0 x 4.7 cm (series 11,
image 24). Scattered inguinal adenopathy noted as well, with largest
node seen on the left and measuring 1.9 cm in short axis (series 11,
image 26).

Scattered small volume free fluid noted within the pelvis. Scattered
edema within the psoas and paraspinous musculature better seen on
corresponding lumbar spine portion of this exam. [DATE] x 4.3 cm
heterogeneous and partially calcified collection at the right lower
quadrant again noted, nonspecific, but may be related to a chronic
hematoma or other collection (series 10, image 57). Finding better
evaluated on prior CT.

Heterogeneous signal intensity seen throughout the bone marrow of
the pelvis. Probable osseous metastasis measuring approximately
cm seen at the right ischial tuberosity (series 11, image 13). No
other definite discrete osseous metastatic lesions seen on this
limited exam.
IMPRESSION: 1. Osseous metastatic disease involving the T12, L1, and L2
vertebral bodies. No associated pathologic fracture or extra osseous
extension of tumor.
2. Probable 1.8 cm osseous metastasis involving the right ischial
tuberosity.
3. Extensive bulky retroperitoneal, iliac, and inguinal adenopathy
as above, consistent with nodal metastatic disease. Findings better
evaluated on recent CT.
4. Scattered edema within the posterior paraspinous and psoas
musculature, with scattered small volume free fluid within the
pelvis. Findings are likely reactive in nature.
5. Marked atrophy of the native kidneys with left lower quadrant
renal transplant in place. No appreciable hydronephrosis or other
obvious abnormality about the renal transplant on this limited exam.
6. No significant disc pathology or stenosis within the lumbar
spine. No impingement.
7. Please note that the pelvic portion of this exam is markedly
limited as the patient terminated the study early. Coronal T1
weighted sequence only was performed.

## 2020-07-12 IMAGING — MR MR HEAD W/O CM
11 series · 48 of 48 positions shown · non-contrast
Comparison: [DATE]

CLINICAL DATA: Metastatic carcinoma, mental status change

EXAM:
MRI HEAD WITHOUT CONTRAST
TECHNIQUE: Multiplanar, multiecho pulse sequences of the brain and surrounding
structures were obtained without intravenous contrast.

[Series 5: DWI · axial · 3.0mm · 1.31mm/px · z∈[-72,+83]mm · 7 of 108 slices shown (1 of 4)]
[im 1/108]
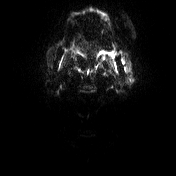
[im 18/108]
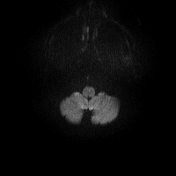
[im 36/108]
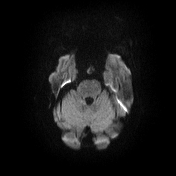
[im 54/108]
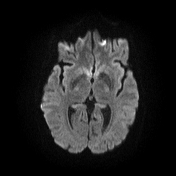
[im 72/108]
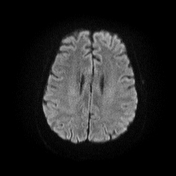
[im 90/108]
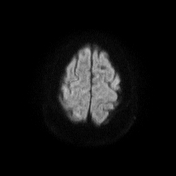
[im 108/108]
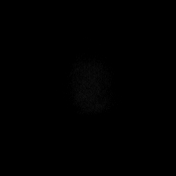

[Series 6: DWI · axial · 3.0mm · 1.31mm/px · z∈[-72,+83]mm · 4 of 54 slices shown (2 of 4)]
[im 1/54]
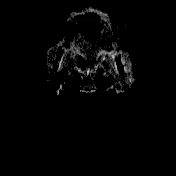
[im 18/54]
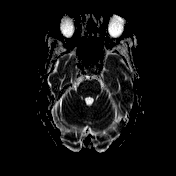
[im 36/54]
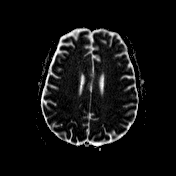
[im 54/54]
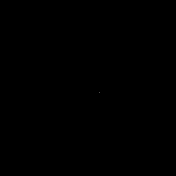

[Series 7: mip_images(sw) · axial · 24.0mm · 0.72mm/px · z∈[-68,+73]mm · 3 of 49 slices shown]
[im 1/49]
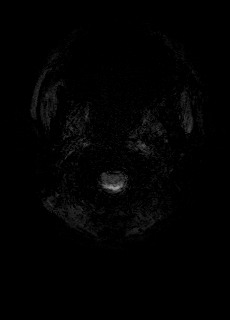
[im 25/49]
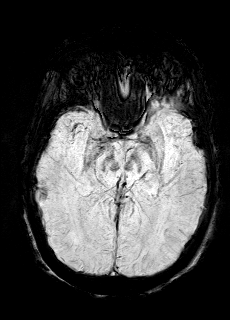
[im 49/49]
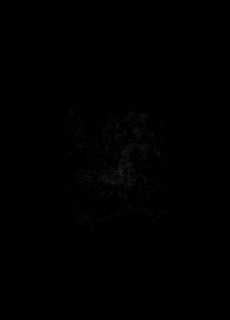

[Series 8: swi_images · axial · 3.0mm · 0.72mm/px · z∈[-78,+83]mm · 4 of 56 slices shown]
[im 1/56]
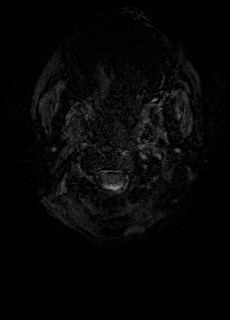
[im 19/56]
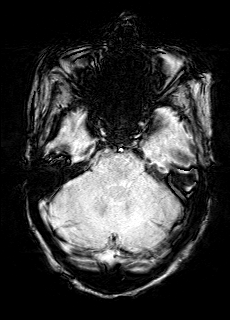
[im 37/56]
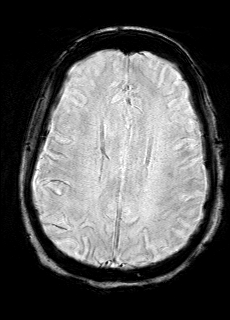
[im 56/56]
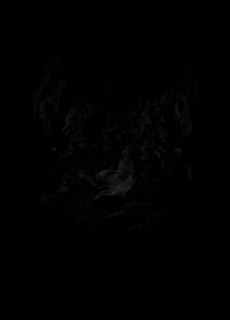

[Series 9: FLAIR · axial · 3.0mm · 0.72mm/px · z∈[-75,+81]mm · 4 of 54 slices shown]
[im 1/54]
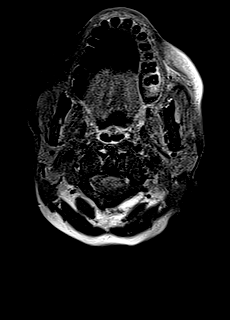
[im 18/54]
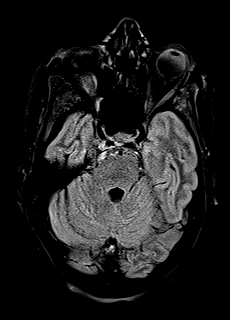
[im 36/54]
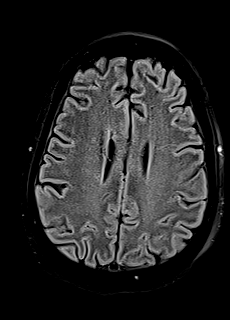
[im 54/54]
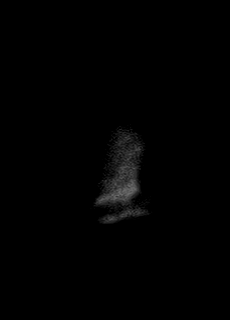

[Series 10: T1 · sagittal · 5.0mm · 0.75mm/px · 2 of 24 slices shown (1 of 2)]
[im 1/24]
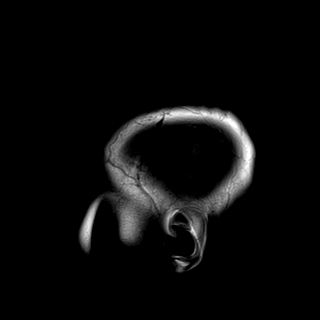
[im 24/24]
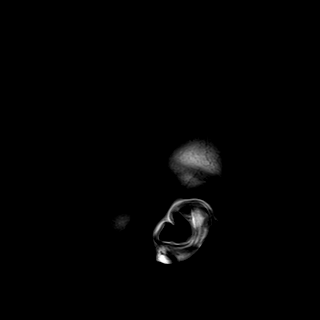

[Series 11: T2 · axial · 5.0mm · 0.60mm/px · z∈[-79,+80]mm · 2 of 26 slices shown (1 of 2)]
[im 1/26]
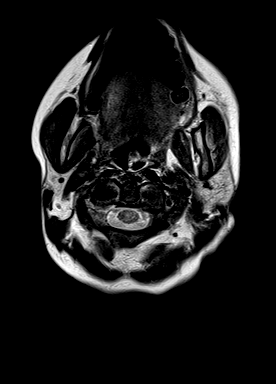
[im 26/26]
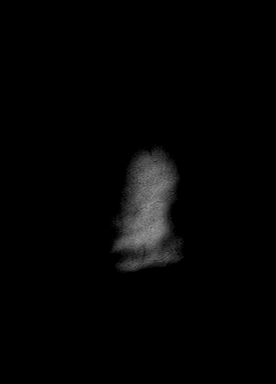

[Series 12: T1 · axial · 1.0mm · 0.90mm/px · z∈[-74,+82]mm · 11 of 160 slices shown (2 of 2)]
[im 1/160]
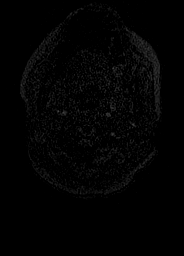
[im 16/160]
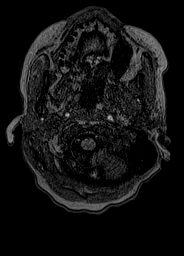
[im 32/160]
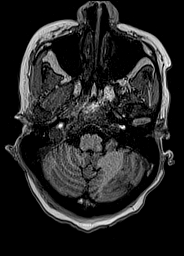
[im 48/160]
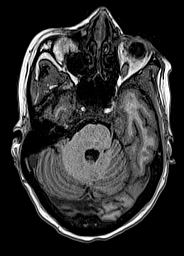
[im 64/160]
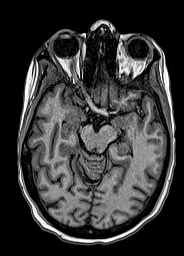
[im 80/160]
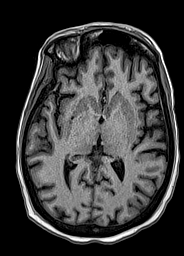
[im 96/160]
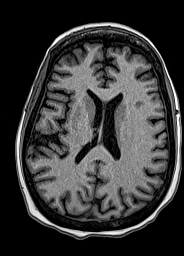
[im 112/160]
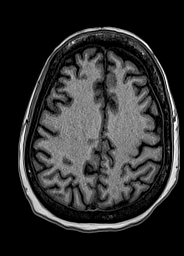
[im 128/160]
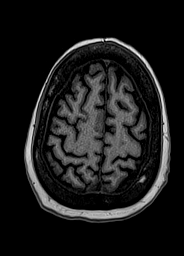
[im 144/160]
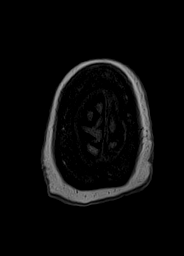
[im 160/160]
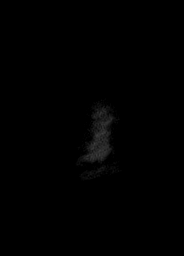

[Series 13: DWI · coronal · 5.0mm · 1.31mm/px · 5 of 76 slices shown (3 of 4)]
[im 1/76]
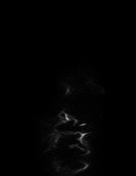
[im 19/76]
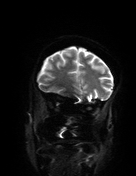
[im 38/76]
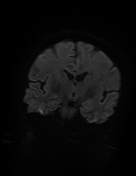
[im 57/76]
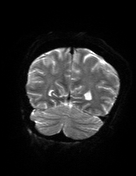
[im 76/76]
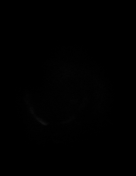

[Series 14: DWI · coronal · 5.0mm · 1.31mm/px · 3 of 38 slices shown (4 of 4)]
[im 1/38]
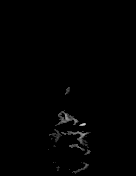
[im 19/38]
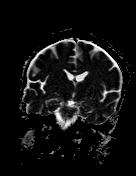
[im 38/38]
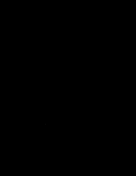

[Series 15: T2 · coronal · 5.0mm · 0.57mm/px · 3 of 38 slices shown (2 of 2)]
[im 1/38]
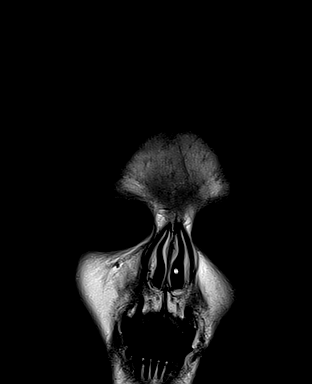
[im 19/38]
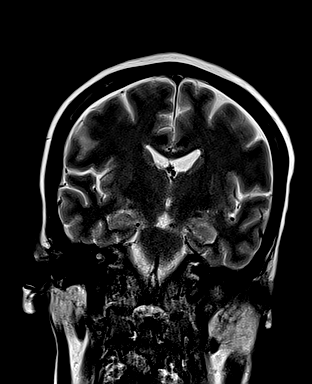
[im 38/38]
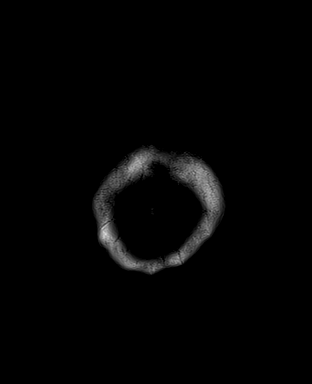

[48 of 48 positions shown; findings below may reference images not displayed]

FINDINGS: Brain: There is no acute infarction or intracranial hemorrhage.
There is no intracranial mass, mass effect, or edema. There is no
hydrocephalus or extra-axial fluid collection. Ventricles are normal
in size and configuration.

Vascular: Major vessel flow voids at the skull base are preserved.

Skull and upper cervical spine: Discrete C2 lesion on prior
postcontrast imaging is not identified on this noncontrast study.

Sinuses/Orbits: Paranasal sinuses are aerated. Orbits are
unremarkable.

Other: Sella is unremarkable.  Mastoid air cells are clear.
IMPRESSION: No acute intracranial abnormality.

## 2020-07-12 IMAGING — MR MR PELVIS W/O CM
2 series · 48 of 48 positions shown · non-contrast
Comparison: Prior CT from earlier the same day.

CLINICAL DATA: Initial evaluation for severe lower back pain and
pelvic pain, history of renal transplant with metastatic carcinoma.

EXAM:
MRI LUMBAR SPINE WITHOUT CONTRAST
MRI PELVIS WITHOUT CONTRAST
TECHNIQUE: Multiplanar, multisequence MR imaging of the lumbar spine was
performed. No intravenous contrast was administered.

[Series 10: 3 plane loc · axial · 3.0mm · 1.56mm/px · z∈[-133,+146]mm · 37 of 109 slices shown]
[im 1/109]
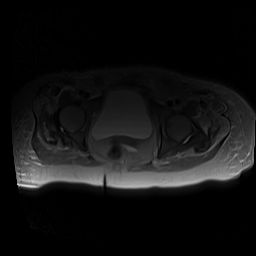
[im 4/109]
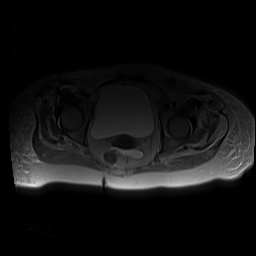
[im 7/109]
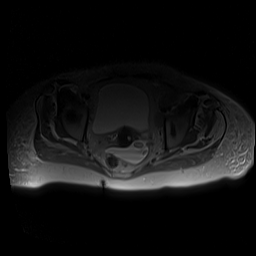
[im 10/109]
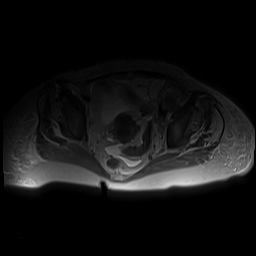
[im 13/109]
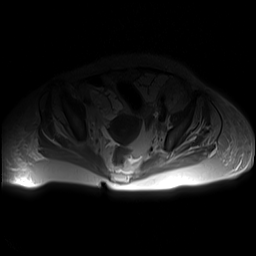
[im 16/109]
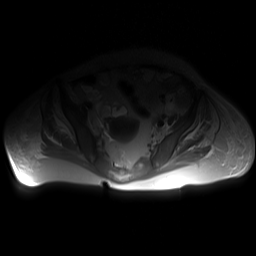
[im 19/109]
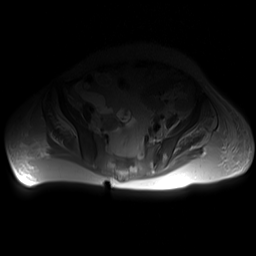
[im 22/109]
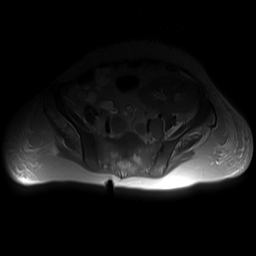
[im 25/109]
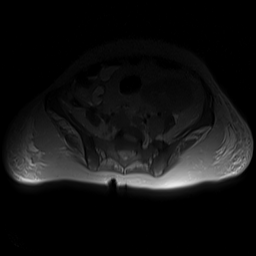
[im 28/109]
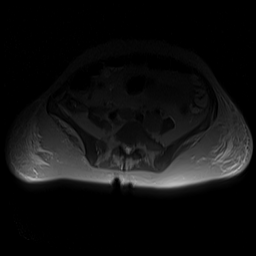
[im 31/109]
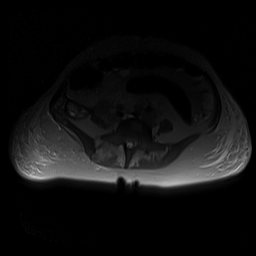
[im 34/109]
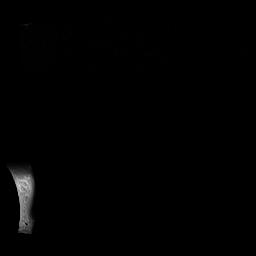
[im 37/109]
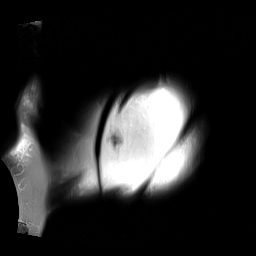
[im 40/109]
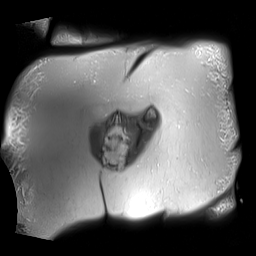
[im 43/109]
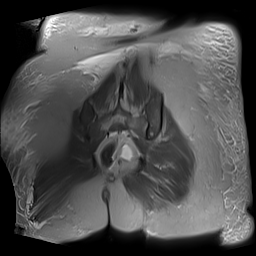
[im 46/109]
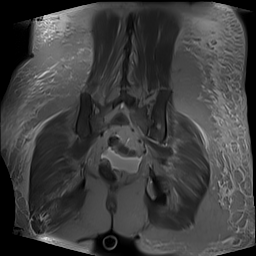
[im 49/109]
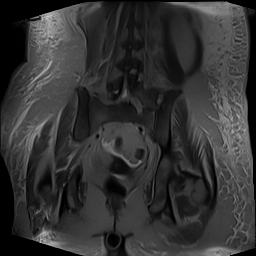
[im 52/109]
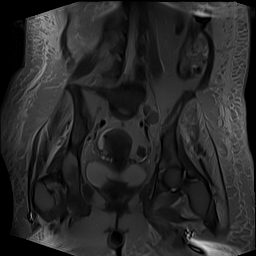
[im 55/109]
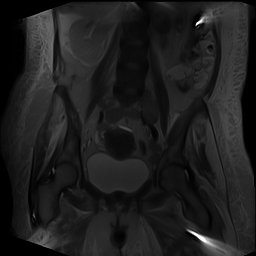
[im 58/109]
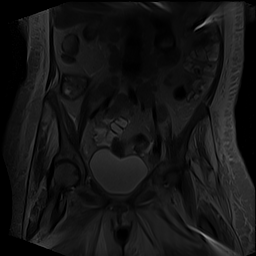
[im 61/109]
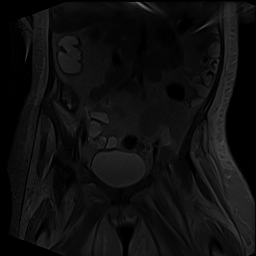
[im 64/109]
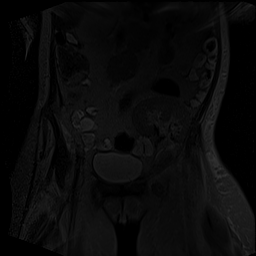
[im 67/109]
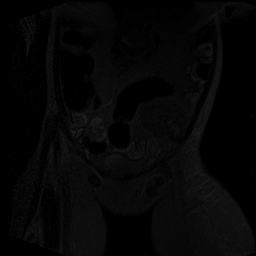
[im 70/109]
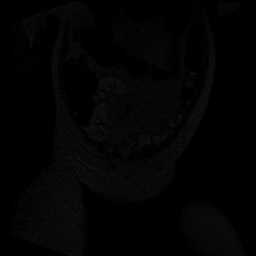
[im 73/109]
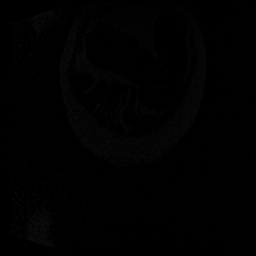
[im 76/109]
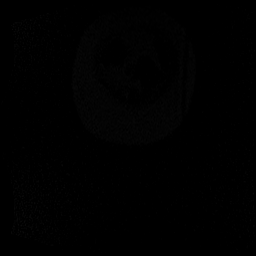
[im 79/109]
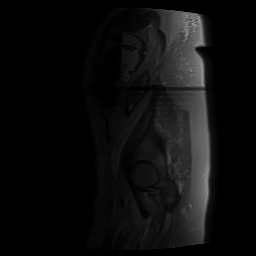
[im 82/109]
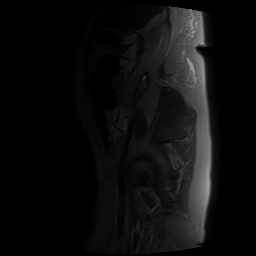
[im 85/109]
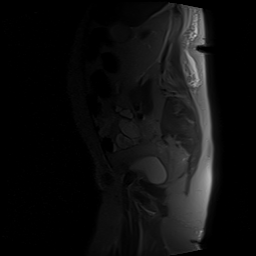
[im 88/109]
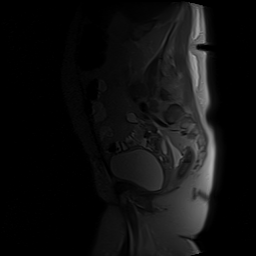
[im 91/109]
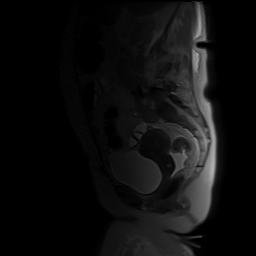
[im 94/109]
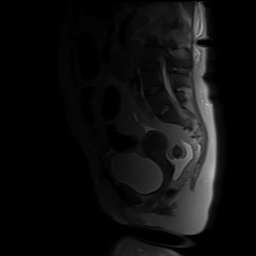
[im 97/109]
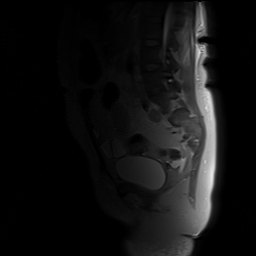
[im 100/109]
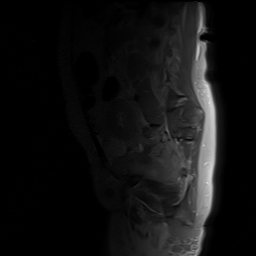
[im 103/109]
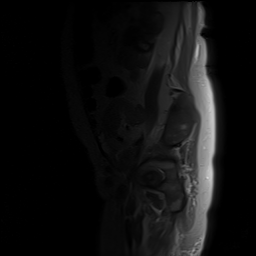
[im 106/109]
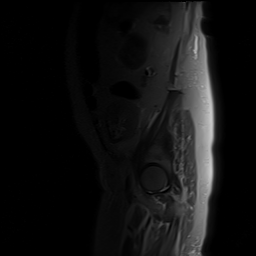
[im 109/109]
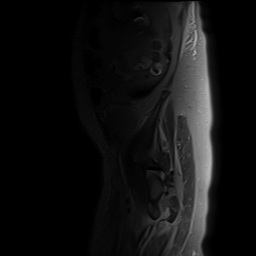

[Series 11: T1 · coronal · 5.0mm · 1.12mm/px · 11 of 32 slices shown]
[im 1/32]
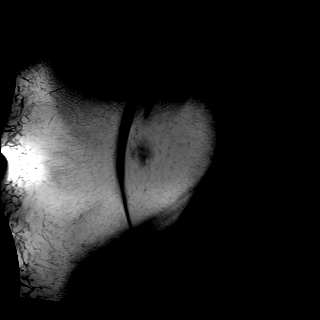
[im 4/32]
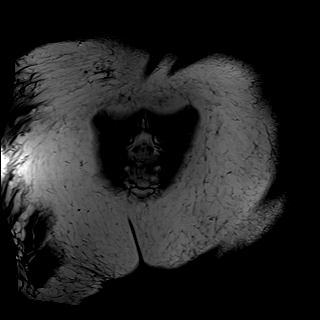
[im 7/32]
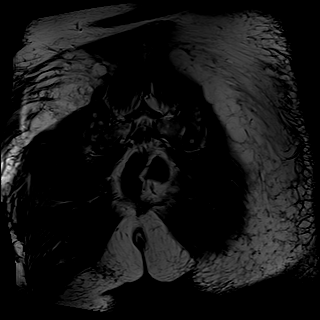
[im 10/32]
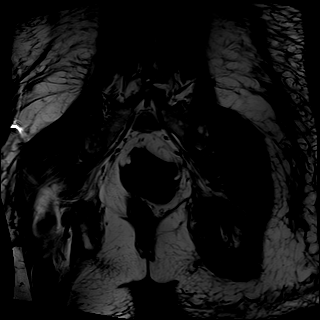
[im 13/32]
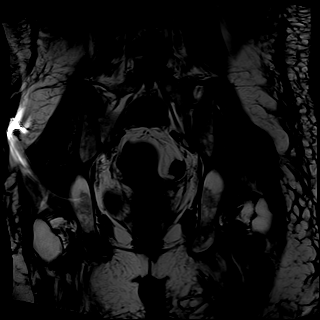
[im 16/32]
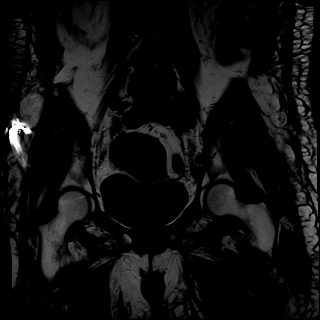
[im 19/32]
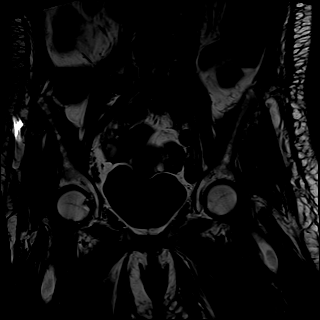
[im 22/32]
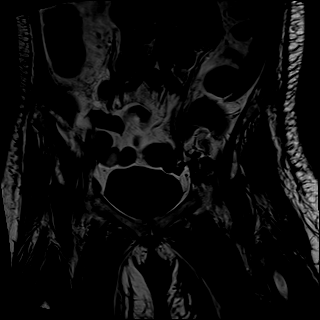
[im 25/32]
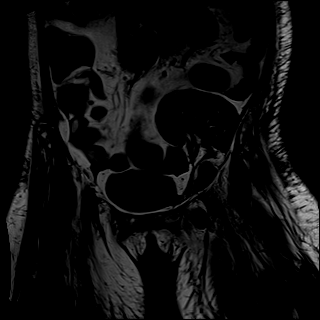
[im 28/32]
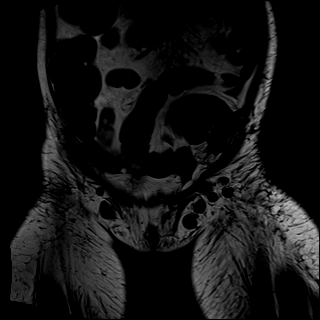
[im 32/32]
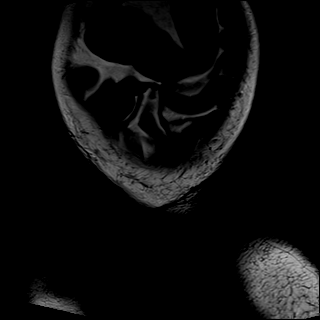

[48 of 48 positions shown; findings below may reference images not displayed]

FINDINGS: MRI LUMBAR SPINE FINDINGS:

Segmentation: Standard. Lowest well-formed disc space labeled the
L5-S1 level.

Alignment: Physiologic with preservation of the normal lumbar
lordosis. No listhesis or static subluxation.

Vertebrae: Abnormal T1 hypointense, STIR hyperintense lesion seen
involving the T12, L1, and L2 vertebral bodies, consistent with
osseous metastatic disease. Involvement of these levels is fairly
diffuse and infiltrative in nature. Heterogeneous marrow signal
elsewhere within the visualized lumbosacral spine without definite
discrete metastasis. No associated pathologic fracture. No extra
osseous extension or epidural extension of tumor.

Conus medullaris and cauda equina: Conus extends to the L1 level.
Conus and cauda equina appear normal.

Paraspinal and other soft tissues: Extensive bulky retroperitoneal
and iliac adenopathy, better seen on recent CT. Associated reactive
marrow edema within the posterior paraspinous and psoas musculature.
Marked atrophy of the native kidneys noted. Renal transplant within
the left iliac fossa partially visualized.

Disc levels:

No significant disc pathology seen within the lumbar spine.
Intervertebral discs are well hydrated with preserved disc height.
No significant disc bulge or focal disc herniation. Minimal
multilevel facet hypertrophy noted throughout the lumbar spine.
Epidural lipomatosis noted within the lower lumbar spine. No
significant spinal stenosis. Foramina remain patent. No impingement.

MRI PELVIS FINDINGS:

Examination is technically limited as the patient was unable to
tolerate the full length of the examination, with the exam
terminated early. Coronal T1 weighted sequence only was performed.

Visualized bowels normal in caliber without evidence for
obstruction. Normal appendix.

Marked atrophy of the native kidneys with a renal transplant within
the left iliac fossa. No appreciable hydronephrosis or other obvious
abnormality about the renal transplant on this limited exam. Urinary
bladder within normal limits.

Uterus within normal limits. Few nabothian cysts noted about the
cervix. Ovaries not well evaluated on this limited study. No
appreciable adnexal mass.

Normal flow voids seen throughout the visualized abdomen and pelvis.

Extensive bulky retroperitoneal and iliac adenopathy again seen,
better evaluated on dedicated CT from earlier the same day. For
reference purposes, the largest nodal conglomerate seen within the
lower right periaortic region and measures 5.0 x 4.7 cm (series 11,
image 24). Scattered inguinal adenopathy noted as well, with largest
node seen on the left and measuring 1.9 cm in short axis (series 11,
image 26).

Scattered small volume free fluid noted within the pelvis. Scattered
edema within the psoas and paraspinous musculature better seen on
corresponding lumbar spine portion of this exam. [DATE] x 4.3 cm
heterogeneous and partially calcified collection at the right lower
quadrant again noted, nonspecific, but may be related to a chronic
hematoma or other collection (series 10, image 57). Finding better
evaluated on prior CT.

Heterogeneous signal intensity seen throughout the bone marrow of
the pelvis. Probable osseous metastasis measuring approximately
cm seen at the right ischial tuberosity (series 11, image 13). No
other definite discrete osseous metastatic lesions seen on this
limited exam.
IMPRESSION: 1. Osseous metastatic disease involving the T12, L1, and L2
vertebral bodies. No associated pathologic fracture or extra osseous
extension of tumor.
2. Probable 1.8 cm osseous metastasis involving the right ischial
tuberosity.
3. Extensive bulky retroperitoneal, iliac, and inguinal adenopathy
as above, consistent with nodal metastatic disease. Findings better
evaluated on recent CT.
4. Scattered edema within the posterior paraspinous and psoas
musculature, with scattered small volume free fluid within the
pelvis. Findings are likely reactive in nature.
5. Marked atrophy of the native kidneys with left lower quadrant
renal transplant in place. No appreciable hydronephrosis or other
obvious abnormality about the renal transplant on this limited exam.
6. No significant disc pathology or stenosis within the lumbar
spine. No impingement.
7. Please note that the pelvic portion of this exam is markedly
limited as the patient terminated the study early. Coronal T1
weighted sequence only was performed.

## 2020-07-12 IMAGING — RF DG NASO G TUBE PLC W/FL W/RAD
1 series · 1 of 1 positions shown · non-contrast
Comparison: Chest CT [DATE]

CLINICAL DATA: Poor fluid intake. Hemodialysis patient. Failed
bedside attempt at feeding tube placement.

[Series 1: cp_standard · 0.20mm/px · 1 of 1 slices shown]
[im 1/1]
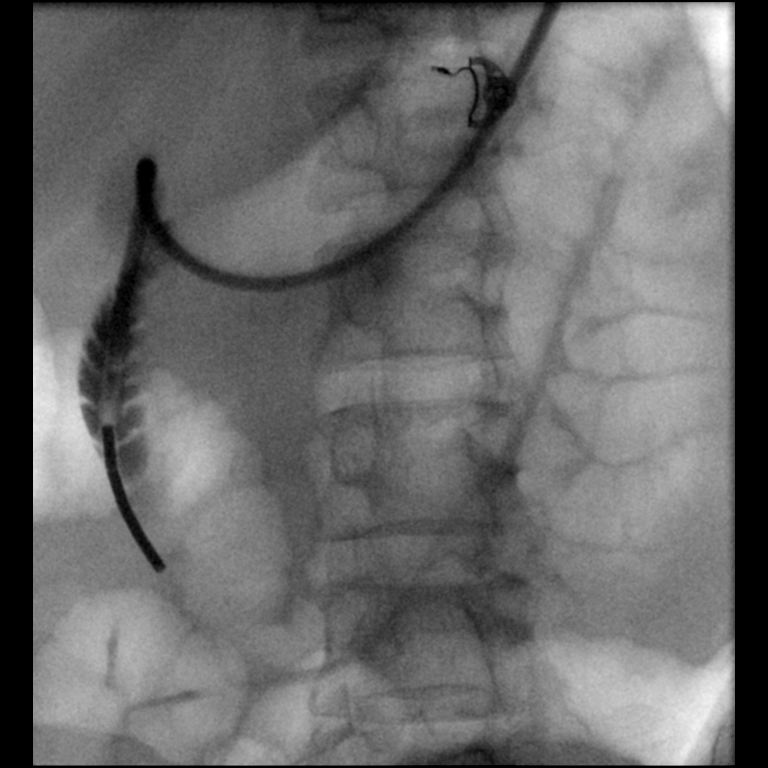

[1 of 1 positions shown; findings below may reference images not displayed]

EXAM:
FEEDING TUBE PLACEMENT UNDER FLUOROSCOPY

CONTRAST:  Approximately 15 cc Omnipaque 300 per feeding tube.

FLUOROSCOPY TIME:  Fluoroscopy Time: 4 minutes and 48 seconds of
low-dose pulsed fluoroscopy.

Radiation Exposure Index (if provided by the fluoroscopic device):
81.9 mGy

Number of Acquired Spot Images: 0
FINDINGS: Time out procedure was performed. A feeding tube was placed via the
left nares and advanced into the esophagus and stomach under
fluoroscopy. The tube was manipulated into the duodenum with the aid
of a stiff Amplatz wire. Tube position in the descending duodenum
was confirmed with a spot image following the injection of a small
amount of contrast through the tube. The patient tolerated the
procedure without immediate complications.
IMPRESSION: Feeding tube placement into the mid duodenum as described.

## 2020-07-12 MED ORDER — OSMOLITE 1.5 CAL PO LIQD
1000.0000 mL | ORAL | Status: DC
  Administered 2020-07-12 – 2020-07-14 (×2): 1000 mL
  Filled 2020-07-12 (×7): qty 1000

## 2020-07-12 MED ORDER — CHLORHEXIDINE GLUCONATE CLOTH 2 % EX PADS
6.0000 | MEDICATED_PAD | Freq: Every day | CUTANEOUS | Status: DC
  Administered 2020-07-12 – 2020-07-14 (×3): 6 via TOPICAL

## 2020-07-12 MED ORDER — SODIUM CHLORIDE 0.9% IV SOLUTION: Freq: Once | INTRAVENOUS | Status: AC

## 2020-07-12 MED ORDER — PROSOURCE TF PO LIQD
45.0000 mL | Freq: Two times a day (BID) | ORAL | Status: DC
  Administered 2020-07-12 – 2020-07-15 (×6): 45 mL
  Filled 2020-07-12 (×7): qty 45

## 2020-07-12 MED ORDER — FUROSEMIDE 10 MG/ML IJ SOLN
40.0000 mg | Freq: Once | INTRAMUSCULAR | Status: DC
  Filled 2020-07-12: qty 4

## 2020-07-12 MED ORDER — VITAL HIGH PROTEIN PO LIQD: 1000.0000 mL | ORAL | Status: DC

## 2020-07-12 MED ORDER — GADOBUTROL 1 MMOL/ML IV SOLN: 7.0000 mL | Freq: Once | INTRAVENOUS | Status: DC | PRN

## 2020-07-12 NOTE — Plan of Care (Signed)
  Problem: Education: Goal: Knowledge of General Education information will improve Description: Including pain rating scale, medication(s)/side effects and non-pharmacologic comfort measures 07/12/2020 1634 by Gena Fray, RN Outcome: Progressing 07/12/2020 1634 by Gena Fray, RN Outcome: Progressing   Problem: Health Behavior/Discharge Planning: Goal: Ability to manage health-related needs will improve Outcome: Progressing   Problem: Clinical Measurements: Goal: Ability to maintain clinical measurements within normal limits will improve 07/12/2020 1634 by Gena Fray, RN Outcome: Progressing 07/12/2020 1634 by Gena Fray, RN Outcome: Progressing Goal: Will remain free from infection Outcome: Progressing Goal: Diagnostic test results will improve Outcome: Progressing Goal: Respiratory complications will improve Outcome: Progressing Goal: Cardiovascular complication will be avoided Outcome: Progressing   Problem: Activity: Goal: Risk for activity intolerance will decrease Outcome: Progressing   Problem: Nutrition: Goal: Adequate nutrition will be maintained Outcome: Progressing   Problem: Coping: Goal: Level of anxiety will decrease Outcome: Progressing   Problem: Elimination: Goal: Will not experience complications related to bowel motility Outcome: Progressing Goal: Will not experience complications related to urinary retention Outcome: Progressing   Problem: Pain Managment: Goal: General experience of comfort will improve Outcome: Progressing   Problem: Safety: Goal: Ability to remain free from injury will improve Outcome: Progressing   Problem: Skin Integrity: Goal: Risk for impaired skin integrity will decrease Outcome: Progressing

## 2020-07-12 NOTE — Progress Notes (Signed)
Blood bank callback and stated blood have to be ordered from [place], [place], noted.

## 2020-07-12 NOTE — Progress Notes (Signed)
OT Cancellation Note  Patient Details Name: Candace Griffin MRN: [id number] DOB: [birth year]   Cancelled Treatment:    Reason Eval/Treat Not Completed: Patient at procedure or test/ unavailable. Patient to have multiple imaging studies and to receive blood today. Will hold today and attempt again as able.  Vineeth Fell L Nayanna Seaborn 07/12/2020, 1:34 PM

## 2020-07-12 NOTE — Progress Notes (Signed)
Failed attempt at MRI. Patient terminated the procedure and refused to continue.

## 2020-07-12 NOTE — Progress Notes (Signed)
Ebony Hail, RN received report at this time, noted.

## 2020-07-12 NOTE — Discharge Summary (Signed)
Physician Discharge Summary  Candace Griffin FIE:[id number] DOB: [birth year] DOA: 06/26/2020  PCP: Richarda Osmond, DO  Admit date: 06/26/2020 Discharge date: 07/12/2020  Admitted From: Home via Clinic Disposition: Transfer to [facility] to the care of Dr. Gracy Racer  Recommendations for Outpatient Follow-up:  1. Follow up care per [facility]  2. Please obtain CMP/CBC, Mag, Phos in one week 3. Please follow up on the following pending results:  Home Health: No  Equipment/Devices: None    Discharge Condition: Stable CODE STATUS: FULL CODE Diet recommendation: Currently NPO but getting Tube Feedings via [employer]  Brief/Interim Summary: The patient is a 44 year old female with a past medical history for renal failure status post kidney transplant, newly diagnosed metastatic carcinoma, likely renal in origin. Presented with 4 days of intractable nausea, vomiting,abdominal pain, not able to tolerate liquids or oral medications. Patient was initially placed on broad spectrum antibiotic therapy and received volume resuscitation. Tacrolimus level was sent to [facility] results were 10.4. Medical oncology has been following. Her Main issue had been her persistent nausea vomiting and abdominal pain with resultant extreme poor oral intake but then complicated new onset confusion and a possible Pneumonia.  07/11/20 She continues to be persistently encephalopathic today and because of her worsening condition and worsening leukocytosis blood cultures were obtained and will repeat a head MRI as well as an EEG and obtain a neurology consult.  Medical oncology is considering holding her chemotherapy given her worsening condition.  Nutrition evaluated and recommending a small bore NG tube placement for tube feeding as she is immunocompromised and will not pursue a PICC line for TPN.  EEG done and was within normal limits with no seizures or epileptiform discharges seen  throughout the recording.  Was given another 500 mL bolus today  07/12/2020 She is more encephalopathic and somnolent today than she was yesterday.  Neurology evaluated and recommending treating with IV thiamine.  Medical oncology now holding her chemotherapy given her worsening condition.  NG tube was able to be placed today and she has been started on tube feedings.  ID was consulted because of her worsening leukocytosis and elevated procalcitonin level and they recommended holding antibiotics at this time.  She continues to get IV fluid hydration with D5W and has not been taking anything orally.  She has been getting antiemetics when she has been awake.  After further discussion with her [facility] transplant service Dr. Aquilla Hacker patient was made to transfer the patient to a higher level of care for further and closer monitoring.  She was graciously accepted to [facility] to the care of Dr. Kathaleen Maser will be transferred there once a bed is available.  Mother was updated and agreed with the plan of care.  Discharge Diagnoses:  Principal Problem:   Pancreatitis Active Problems:   GERD   Status post kidney transplant   Hypertension   Symptomatic anemia   Metastatic carcinoma to liver (HCC)   AKI (acute kidney injury) ([place])   Hepatitis   Generalized abdominal pain   Renal cell carcinoma (HCC)   Non-intractable vomiting   Malnutrition of moderate degree  Abdominal pain with nausea and vomiting, improving  -Suspect that her abdominal pain and vomiting is likely due to the metastatic disease noted in the abdomen. -Patient continues to have nausea though -She remains on scheduled Reglan and has not been able to tolerate very much p.o.  -Was started on Marinol by medical oncology but will stop given her new onset  confusion. Also remains on scopolamine patch and other as needed medications. -Abdominal pain seemed to be reasonably well controlled however has now worsened.  -Dose of OxyContin was  increased 4 days ago. Continue current dose for now. -Abdominal x-ray done the day before did not show any obstruction or ileus. -If continues have significant nausea vomiting may need a GI consultation and repeat abdominal CT scan   -Because she is encephalopathic and not eating very much we will have a small bore NG tube placement for tube feedings -NG tube was placed and tube feedings have been initiated -She continued to have emesis and nausea vomiting and abdominal pain earlier so repeat CT scan of the abdomen pelvis was done without contrast  Bilateral thigh pain -Patient mentions that she is notices pain in both her thigh areas for the last few days. -She does have peripheral pulses. No obvious erythema is noted.  -We will proceed with Doppler studies and showed no evidence of any lower extremity DVT -Also check CK ESR CRP; ESR was elevated at 118, CK was 14, CRP was 29.1 -Neurology was consulted and they are recommending an MRI of the L-spine and MRI of the pelvis with and without contrast to assess for possible metastasis infiltrating or producing mass-effect on the right lumbosacral plexus given vital extremity muscle atrophy with weakness -Continue to monitor and trend and could be in the setting of her metastatic cancer  Acute kidney injury likely prerenal/anion gap metabolic acidosis/lactic acidosis/electrolyte imbalances -Baseline creatinine is around 1.0. Presented with a creatinine of 1.42 with a BUN of 38. She was given IV fluids with improvement and now will give another dose of normal saline bolus -Due to the nausea vomiting patient again became dehydrated. Creatinine went up to 1.23 on 11/14. -She was given IV fluids with improvement in renal function.   Creatinine has been relatively stable and BUN/creatinine is now 21/1.08 -She does have a mild metabolic acidosis with a CO2 of 21, anion gap of 10, chloride level 105 -Avoid further nephrotoxic medications, contrast  dyes, hypotension and renally dose medications -Repeat CMP in a.m. -Will Transfer to [facility] for Transplant Nephrology Service   Hypomagnesemia -Mag Level is now 2.1 -Continue to Monitor and Replete as Necessary -Repeat Mag Level in the AM   Elevated liver enzymes/metastatic carcinoma likely renal primary/drug induced hepatitis. Hyperbilirubinemia -US liver with no gallbladder stone or sonographic signs of acute cholecystitis. 1.8x1.5x1.5 cm hypoechoic lesion left lobe of the liver. -Acute viral hepatitis negative. Acetaminophen level 10. -LFTs have improved. Bilirubin is slowly trending up and is now 5.0 stable but elevated for the last week or so -Concern for metastatic disease on imaging studies. -May obtain a right upper quadrant ultrasoundbut will hold off for now as LFTs have been relatively stable; -She was started back on the cabozantinib and dose increased  By Oncology and this has now been held  Severe systemic inflammatory response syndrome(sepsisruled out initially). -Reason for her presentation is still not entirely clear. Her lipase level was normal.  -LFTs have been improving and almost normalized. Continue with PPI and sucralfate. -Likely she has a pneumonia and will be treated as below given her chest x-ray findings -We will obtain blood cultures and they are negative so far -WBC is starting to trend up and went from 15.8 has now 21.4 and further went up to 29,300 -She had elevated inflammatory markers with a CRP of 29.1, and ESR of 118 -The patient is more encephalopathic so will obtain MRI of  the head again -We will also obtain a CT of the chest and CT of the abdomen pelvis without contrast -CT Scans showed "Multifocal pneumonia. Aspiration is not excluded. Clinical correlation is recommended. Small bilateral pleural effusions, new or worsened since the prior CT. Extensive retroperitoneal nodal metastatic disease as well as hepatic and osseous metastatic  disease as seen previously. The liver lesions are better seen on the prior contrast enhanced CT. Atrophic native kidneys with a Left lower quadrant renal transplant. No hydronephrosis or nephrolithiasis. Diffuse subcutaneous edema and anasarca, new or worsened since the prior CT. Aortic Atherosclerosis." -Infectious diseases was consulted for further evaluation recommendations and they recommended stopping antibiotics but with an elevated procalcitonin and the chest CT findings and and edema and anasarca will likely continue antibiotics -We will stop IV fluid hydration with D5 at 75 mils per hour and start gentle diuresis given that she is +15.963 L -Procalcitonin level was elevated 8.78    Status post kidney transplant -Continue with prednisone,mycophenolate and tacrolimus. -Previous rounding MD spoke with Dr. Aquilla Hacker from renal transplant at [facility], plan to continue current immunosuppressive regimen for now.Tacrolimus level sent on 11/10 noted to be 10.4. -Reference rangeis between 2 and 20. -Dr. Gerrie Nordmann Discussed withDr. Altamease Oiler.No changes recommended to her current immunosuppressant regimen.  -Medical oncology, Dr. Irene Limbo, has also been in touch with Dr. Altamease Oiler  Hypophosphatemia -Patient's Phos level was 2.3 and improved to 2.8 -Replete with IV K-Phos 10 mmol -Continue monitor and replete as necessary -Repeat Phos Level in the AM   Anxiety -Continue with as neededlorazepam.  Chronic anemia normocytic likely malignancy related/leukocytosis -She was transfused on 11/13 for hemoglobin of 7.4.  Hemoglobin Again now 7.1 today and will be typed and screened and transfused 1 unit PRBCs again -Leukocytosis is still elevated but could be in the setting of her prednisone versus a possible pneumonia-WBC is worsening and went from 15.8 is now 21.4-continue to monitor for signs and symptoms of bleeding; no overt bleeding noted Repeat CBC in a.m.  Sinus tachycardia -Elevated heart  rate most likely due to hypovolemia and because of pain issues but now could be from infection from Multifocal PNA. EKG shows sinus tachycardia.  -She was given a bolus of normal saline 500 mL yesterday and will be given another dose today -TSH was noted to be normal on 11/3. T4 was noted to be 1.45 which is slightly on the higher side. -Thyroid function tests were repeated on 11/14 and TSH noted to be 3.08 and free T4 noted to be 1.14. -Echocardiogram was also done which does not show any significant abnormalities -IV fluid hydration is now stopped and we will give gentle diuresis.  -Continue low dose metoprolol but she had not been getting this due to her nausea and vomiting so this was increased and then changed IV 5 mg every 8 hours scheduled.  Confusion and Encephalopathy, new onset, persistent and worsening -Patient has been having periods of lucidity but then confabulates and goes off tangent and was more encephalopathic today -Check an ammonia level, head CT scan and place on delirium precautions; ammonia level was normal at 24 -Repeat blood work showing a worsening leukocytosis and stable kidney function. -Question if this is related to infection and will discontinue the Marinol given oncology recommendations  -MRI of the brain was ordered and was negative for metastatic disease of the brain but did show a 1 cm enhancing lesion at the C2 vertebral body compatible with metastatic disease; will the brain as well  as obtaining imaging and neurology consultation -Repeat MRI is pending -Neurology recommending treating for B1 deficiency and she is given stat thiamine -Urinalysis done which showed a clear appearance with small bilirubin, negative leukocytes, negative ketones, rare bacteria, 0-5 WBCs and a urine culture blood cultures x2 pending -Medical oncology following closely -Consulted Palliative Care for further GOC Discussion but will be transferring to [facility] when Bed is available    HCAP/Multifocal Pneumonia, not present on admission -Repeat chest x-ray today showed "There is consolidation in the left lower lobe with small left pleural effusion. There is ill-defined airspace opacity in the right base. Heart size and pulmonary vascularity are normal. No adenopathy. There is aortic atherosclerosis. No bone lesions." -We will start on CAP coverage with IV ceftriaxone and IV azithromycin -May add Breathing Tx if Necessary -Repeat CXR intermittently and showed "Stable low volume chest with infiltrates or atelectasis at the Bases." -COVID 19 PCR was negative on Admission -CT of the chest as above and shows Multifocal Pneumonia  -Stop IVF and Start Diuresis -Repeat CXR as Above  Volume Overload -In the setting of Aggressive IV Resuscitation and poor Nutritional Status with Low Albumin -She is +15 Liters -Stop IVF and will need Diuresis -Strict I's and O's and Daily Weights -Continue to Monitor closely  Discharge Instructions  Allergies as of 07/12/2020      Reactions   Lisinopril Cough   Tape Rash, Other (See Comments)   Causes welts also      Medication List    STOP taking these medications   dexamethasone 4 MG tablet Commonly known as: DECADRON   fluconazole 100 MG tablet Commonly known as: DIFLUCAN   lidocaine-prilocaine cream Commonly known as: EMLA   omeprazole 20 MG capsule Commonly known as: PRILOSEC   pravastatin 20 MG tablet Commonly known as: PRAVACHOL   prochlorperazine 10 MG tablet Commonly known as: COMPAZINE   senna 8.6 MG Tabs tablet Commonly known as: SENOKOT     TAKE these medications   Envarsus XR 1 MG Tb24 Generic drug: Tacrolimus ER Take 4 mg by mouth daily.   LORazepam 0.5 MG tablet Commonly known as: ATIVAN Take 1 tablet (0.5 mg total) by mouth every 8 (eight) hours as needed for anxiety. What changed: Another medication with the same name was removed. Continue taking this medication, and follow the directions you  see here.   mycophenolate 250 MG capsule Commonly known as: CELLCEPT Take 500 mg by mouth 2 (two) times daily.   ondansetron 8 MG tablet Commonly known as: Zofran Take 1 tablet (8 mg total) by mouth every 8 (eight) hours as needed for nausea or vomiting. What changed: Another medication with the same name was removed. Continue taking this medication, and follow the directions you see here.   oxyCODONE 5 MG immediate release tablet Commonly known as: Roxicodone Take 2 tablets (10 mg total) by mouth every 4 (four) hours as needed for moderate pain or severe pain.   predniSONE 5 MG tablet Commonly known as: DELTASONE Take 5 mg by mouth daily with breakfast.            Durable Medical Equipment  (From admission, onward)         Start     Ordered   07/09/20 1042  For home use only DME Tub bench  Once        07/09/20 1041   07/08/20 1053  For home use only DME Walker rolling  Once       Question Answer  Comment  Walker: With [place]   Patient needs a walker to treat with the following condition Physical deconditioning      07/08/20 [place] [place] Follow up.   Why: [place] physical therapy/occupational therapy Contact information: [place] [address] [phone]        [facility] Follow up.   Why: rolling walker;tub bench Contact information: [address] [place] [address] [phone]              Allergies  Allergen Reactions  . Lisinopril Cough  . Tape Rash and Other (See Comments)    Causes welts also   Consultations:  [facility] Dr. Altamease Oiler  Palliative Care Medicine  Neurology  Infectious Diseases  Procedures/Studies: CT ABDOMEN PELVIS WO CONTRAST  Result Date: 07/12/2020 CLINICAL DATA:  44 year old female with nausea and vomiting. Metastatic cancer of unknown primary. EXAM: CT CHEST, ABDOMEN  AND PELVIS WITHOUT CONTRAST TECHNIQUE: Multidetector CT imaging of the chest, abdomen and pelvis was performed following the standard protocol without IV contrast. COMPARISON:  CT of the chest abdomen pelvis dated 07/01/2020. FINDINGS: CT CHEST FINDINGS Evaluation of this exam is limited in the absence of intravenous contrast. Cardiovascular: There is no cardiomegaly or pericardial effusion. There is mild atherosclerotic calcification of the aortic arch. The central pulmonary arteries are grossly unremarkable on this noncontrast CT. Mediastinum/Nodes: No definite hilar or mediastinal adenopathy. Evaluation however is limited in the absence of intravenous contrast as well as due to consolidative changes of the lungs. A feeding tube noted within the esophagus. The thyroid gland is grossly unremarkable. No mediastinal fluid collection. Lungs/Pleura: Small bilateral pleural effusions. There are large areas of consolidation involving the lower lobes as well as smaller areas of patchy ground-glass density involving the right middle lobe, right upper lobe, and lingula most consistent with multifocal pneumonia. Aspiration is not excluded clinical correlation is recommended. There is no pneumothorax. The central airways are patent. Musculoskeletal: No chest wall mass or suspicious bone lesions identified. CT ABDOMEN PELVIS FINDINGS No intra-abdominal free air. Small perihepatic free fluid. Hepatobiliary: Several faint hepatic hypodense lesions consistent with metastatic disease. These were better seen on the contrast enhanced CT of 07/01/2020. no intrahepatic biliary dilatation. High attenuating content within the gallbladder may represent sludge or vicarious excretion of recently administered contrast. Pancreas: The pancreas is grossly unremarkable. Spleen: Normal in size without focal abnormality. Adrenals/Urinary Tract: The adrenal glands are suboptimally visualized. Severe bilateral renal atrophy. There is a left lower  quadrant renal transplant. There is no hydronephrosis or nephrolithiasis of the transplant kidney. No peritransplant fluid collection. There is duplicated appearance of the renal transplant collecting system. The urinary bladder is unremarkable. Stomach/Bowel: A feeding tube with tip in the distal duodenum. There is no bowel obstruction. The appendix is normal. Vascular/Lymphatic: Moderate aortoiliac atherosclerotic disease. No portal venous gas. Large and bulky retroperitoneal adenopathy as well as bilateral inguinal adenopathy. Large adenopathy along the left iliac chain. Reproductive: The uterus is grossly unremarkable. Other: There is a 3.8 x 2.7 cm low attenuating collection with areas of calcification along the right pelvic sidewall similar to prior CT. This is not well characterized but may related to an old hematoma or necrotic collection. Musculoskeletal: There is diffuse subcutaneous edema and anasarca, new or worsened since the prior CT. Sclerotic osseous metastatic disease predominantly involving T12, L1, L2 as  seen previously. Sclerotic metastatic involvement of the right pubic bone. No acute osseous pathology. IMPRESSION: 1. Multifocal pneumonia. Aspiration is not excluded. Clinical correlation is recommended. 2. Small bilateral pleural effusions, new or worsened since the prior CT. 3. Extensive retroperitoneal nodal metastatic disease as well as hepatic and osseous metastatic disease as seen previously. The liver lesions are better seen on the prior contrast enhanced CT. 4. Atrophic native kidneys with a Left lower quadrant renal transplant. No hydronephrosis or nephrolithiasis. 5. Diffuse subcutaneous edema and anasarca, new or worsened since the prior CT. 6. Aortic Atherosclerosis (ICD10-I70.0). Electronically Signed   By: Anner Crete M.D.   On: 07/12/2020 17:07   CT ABDOMEN PELVIS WO CONTRAST  Result Date: 06/26/2020 CLINICAL DATA:  Diffuse abdominal pain and vomiting. History of cancer  and renal transplant. Liver mass post recent liver biopsy. EXAM: CT ABDOMEN AND PELVIS WITHOUT CONTRAST TECHNIQUE: Multidetector CT imaging of the abdomen and pelvis was performed following the standard protocol without IV contrast. COMPARISON:  Contrast-enhanced exam 05/27/2020. FINDINGS: Lower chest: No focal airspace disease, pleural fluid, or pulmonary nodularity. Heart is normal in size. Hepatobiliary: Patient's known hepatic lesions are not well demonstrated on noncontrast exam. Liver parenchyma is heterogeneous. There may be a small subcapsular hematoma inferiorly from recent liver biopsy, not well-defined on this noncontrast exam. The gallbladder is present. There is high-density material in the gallbladder lumen, possibly sludge. No calcified gallstone. Common bile duct is poorly defined, but no evidence of biliary dilatation. Pancreas: No ductal dilatation or inflammation. Spleen: Normal in size without focal abnormality. Adrenals/Urinary Tract: Normal adrenal glands. Chronic bilateral native renal atrophy. Solid-appearing lesion in the lower left renal hilum was better appreciated on prior contrast-enhanced CT, grossly unchanged, series 2, image 35. Transplant kidney in the left lower quadrant. Scarring in the left lower pole is better appreciated on prior contrast-enhanced exam. Similar fullness of the renal collecting system without frank hydronephrosis. No transplant perinephric edema. Sequela of failed transplant in the right iliac fossa. The urinary bladder is unremarkable. Stomach/Bowel: Bowel evaluation is limited in the absence of enteric contrast. There is no bowel obstruction or evidence of bowel inflammation. The appendix is normal. Vascular/Lymphatic: Bulky retroperitoneal adenopathy, some of which is low-density and typical of necrosis. Occasional areas of nodal calcification. Adenopathy extends from the retrocrural space to the bilateral common iliac arteries, and left external iliac  station. There also enlarged bilateral inguinal lymph nodes, left greater than right. Index aortocaval node measures 4.1 x 2.4 cm, previously 4.2 x 2.6 cm. Overall adenopathy is not significantly changed in the interim allowing for noncontrast technique. There is aortic and branch atherosclerosis. Reproductive: Bulky uterus with fibroids.  No obvious adnexal mass. Other: There is trace free fluid in the pelvis, slightly increased from prior exam. No other free fluid or ascites. No free air. Musculoskeletal: Ill-defined sclerosis involving right L2 vertebral body, nonspecific but unchanged from previous. Sclerotic density in the left acetabulum. There is hemi transitional lumbosacral anatomy. No fracture or acute osseous abnormality. IMPRESSION: 1. Patient's known hepatic lesions are not well-defined on this noncontrast exam. There may be a small subcapsular hematoma inferiorly from recent liver biopsy, not well-defined. No evidence of hemoperitoneum. 2. Bulky retroperitoneal and bilateral inguinal adenopathy, grossly unchanged from prior. 3. Transplant kidney in the left lower quadrant with unchanged fullness of the renal collecting system. Sequela of failed transplant in the right iliac fossa. Stable soft tissue density in the region of the native left kidney lower hilum. 4. Bulky uterus with  fibroids. 5. Nonspecific sclerosis involving the right aspect of L2 vertebral body. Aortic Atherosclerosis (ICD10-I70.0). Electronically Signed   By: Keith Rake M.D.   On: 06/26/2020 16:45   CT HEAD WO CONTRAST  Result Date: 07/10/2020 CLINICAL DATA:  Delirium, history of urologic malignancy. EXAM: CT HEAD WITHOUT CONTRAST TECHNIQUE: Contiguous axial images were obtained from the base of the skull through the vertex without intravenous contrast. COMPARISON:  MRI 06/28/2020 FINDINGS: Brain: No evidence of acute infarction, hemorrhage, hydrocephalus, extra-axial collection, mass effect or visible mass lesion 4 within  the limitations of an unenhanced CT exam. Basal cisterns are patent. Midline intracranial structures are normal. Cerebellar tonsils are normally positioned. Vascular: No hyperdense vessel or unexpected calcification. Skull: Few prominent venous lakes. No convincing focal sclerotic or lytic osseous metastatic disease. A previously seen C2 vertebral body metastasis is not within the margins of imaging. Scalp soft tissues are unremarkable. Sinuses/Orbits: Paranasal sinuses and mastoid air cells are predominantly clear. Included orbital structures are unremarkable. Other: None IMPRESSION: 1. No acute intracranial findings. No visible metastatic lesions within the limitations of unenhanced CT imaging. 2. A previously seen C2 vertebral body metastasis is not within the margins of imaging. Electronically Signed   By: Lovena Le M.D.   On: 07/10/2020 19:59   CT CHEST WO CONTRAST  Result Date: 07/12/2020 CLINICAL DATA:  44 year old female with nausea and vomiting. Metastatic cancer of unknown primary. EXAM: CT CHEST, ABDOMEN AND PELVIS WITHOUT CONTRAST TECHNIQUE: Multidetector CT imaging of the chest, abdomen and pelvis was performed following the standard protocol without IV contrast. COMPARISON:  CT of the chest abdomen pelvis dated 07/01/2020. FINDINGS: CT CHEST FINDINGS Evaluation of this exam is limited in the absence of intravenous contrast. Cardiovascular: There is no cardiomegaly or pericardial effusion. There is mild atherosclerotic calcification of the aortic arch. The central pulmonary arteries are grossly unremarkable on this noncontrast CT. Mediastinum/Nodes: No definite hilar or mediastinal adenopathy. Evaluation however is limited in the absence of intravenous contrast as well as due to consolidative changes of the lungs. A feeding tube noted within the esophagus. The thyroid gland is grossly unremarkable. No mediastinal fluid collection. Lungs/Pleura: Small bilateral pleural effusions. There are large  areas of consolidation involving the lower lobes as well as smaller areas of patchy ground-glass density involving the right middle lobe, right upper lobe, and lingula most consistent with multifocal pneumonia. Aspiration is not excluded clinical correlation is recommended. There is no pneumothorax. The central airways are patent. Musculoskeletal: No chest wall mass or suspicious bone lesions identified. CT ABDOMEN PELVIS FINDINGS No intra-abdominal free air. Small perihepatic free fluid. Hepatobiliary: Several faint hepatic hypodense lesions consistent with metastatic disease. These were better seen on the contrast enhanced CT of 07/01/2020. no intrahepatic biliary dilatation. High attenuating content within the gallbladder may represent sludge or vicarious excretion of recently administered contrast. Pancreas: The pancreas is grossly unremarkable. Spleen: Normal in size without focal abnormality. Adrenals/Urinary Tract: The adrenal glands are suboptimally visualized. Severe bilateral renal atrophy. There is a left lower quadrant renal transplant. There is no hydronephrosis or nephrolithiasis of the transplant kidney. No peritransplant fluid collection. There is duplicated appearance of the renal transplant collecting system. The urinary bladder is unremarkable. Stomach/Bowel: A feeding tube with tip in the distal duodenum. There is no bowel obstruction. The appendix is normal. Vascular/Lymphatic: Moderate aortoiliac atherosclerotic disease. No portal venous gas. Large and bulky retroperitoneal adenopathy as well as bilateral inguinal adenopathy. Large adenopathy along the left iliac chain. Reproductive: The uterus is grossly  unremarkable. Other: There is a 3.8 x 2.7 cm low attenuating collection with areas of calcification along the right pelvic sidewall similar to prior CT. This is not well characterized but may related to an old hematoma or necrotic collection. Musculoskeletal: There is diffuse subcutaneous  edema and anasarca, new or worsened since the prior CT. Sclerotic osseous metastatic disease predominantly involving T12, L1, L2 as seen previously. Sclerotic metastatic involvement of the right pubic bone. No acute osseous pathology. IMPRESSION: 1. Multifocal pneumonia. Aspiration is not excluded. Clinical correlation is recommended. 2. Small bilateral pleural effusions, new or worsened since the prior CT. 3. Extensive retroperitoneal nodal metastatic disease as well as hepatic and osseous metastatic disease as seen previously. The liver lesions are better seen on the prior contrast enhanced CT. 4. Atrophic native kidneys with a Left lower quadrant renal transplant. No hydronephrosis or nephrolithiasis. 5. Diffuse subcutaneous edema and anasarca, new or worsened since the prior CT. 6. Aortic Atherosclerosis (ICD10-I70.0). Electronically Signed   By: Anner Crete M.D.   On: 07/12/2020 17:07   MR Brain W Wo Contrast  Result Date: 06/28/2020 CLINICAL DATA:  Urologic cancer, staging EXAM: MRI HEAD WITHOUT AND WITH CONTRAST TECHNIQUE: Multiplanar, multiecho pulse sequences of the brain and surrounding structures were obtained without and with intravenous contrast. CONTRAST:  33mL GADAVIST GADOBUTROL 1 MMOL/ML IV SOLN COMPARISON:  MRI head 11/17/2016 FINDINGS: Brain: No acute infarction, hemorrhage, hydrocephalus, extra-axial collection or mass lesion. Normal white matter. Negative for metastatic disease to the brain. No enhancing mass lesion in the brain. Vascular: Normal arterial flow voids. Skull and upper cervical spine: Enhancing lesion in the lower body of the C2 vertebral body. This shows decreased signal prior to contrast and is consistent with metastatic disease. No fracture or cord compression. No skull lesion identified. Benign fatty lesion in the left parietal bone. Sinuses/Orbits: Paranasal sinuses clear.  Negative orbit Other: None IMPRESSION: Negative for metastatic disease to the brain 1 cm  enhancing lesion C2 vertebral body compatible with metastatic disease. No fracture or cord compression Electronically Signed   By: Franchot Gallo M.D.   On: 06/28/2020 21:31   CT ABDOMEN PELVIS W CONTRAST  Result Date: 07/01/2020 CLINICAL DATA:  Sepsis, abdominal pain, previous kidney transplant x2. Metastatic carcinoma of unknown primary. EXAM: CT ABDOMEN AND PELVIS WITH CONTRAST TECHNIQUE: Multidetector CT imaging of the abdomen and pelvis was performed using the standard protocol following bolus administration of intravenous contrast. CONTRAST:  16mL OMNIPAQUE IOHEXOL 300 MG/ML  SOLN COMPARISON:  06/26/2020 and previous FINDINGS: Lower chest: Trace pleural effusions. Hepatobiliary: Multiple liver lesions. 3.2 cm segment 2 lesion shows definite progression since 05/27/2020, although is more wedge-shaped and may represent focal infarct. Multiple low-attenuation lesions in the right lobe have not convincingly changed allowing for differences in scan timing. There are nonspecific transient hepatic attenuation differences on the portal venous phase which resolve on delayed venous phase imaging. No biliary ductal dilatation. Gallbladder unremarkable. Pancreas: Unremarkable. No pancreatic ductal dilatation or surrounding inflammatory changes. Spleen: Normal in size. There peripheral areas of decreased enhancement which were not present on prior study of 05/27/2020, however this area was not included on the delayed scan, and may represent transient attenuation differences versus true lesions. Adrenals/Urinary Tract: Adrenal glands unremarkable. Marked atrophy of native kidneys. Normal enhancement of left iliac fossa transplant kidney. Coarse calcifications and low-attenuation in the right iliac fossa presumably related to remote transplant. The urinary bladder is incompletely distended. Stomach/Bowel: Stomach is decompressed. Small bowel is nondilated. The colon is incompletely  distended, unremarkable.  Vascular/Lymphatic: Bulky bilateral inguinal, left external iliac, bilateral common iliac, left para-aortic and aortocaval adenopathy as before. The larger nodes contain scattered calcifications and some with central low-attenuation regions as before. Moderate atheromatous aortoiliac plaque without aneurysm. Reproductive: Uterine fibroid.  No adnexal mass. Other: Small volume perihepatic and pelvic ascites slightly increased. No free air. Musculoskeletal: Poorly marginated sclerotic lesions in T12, L1, and L2 vertebral bodies, progressive since 05/16/2020. benign left supra-acetabular bone island stable since 02/03/2015. IMPRESSION: 1. No definite source of sepsis identified. 2. Extensive pelvic and retroperitoneal nodal; hepatic; and osseous metastatic disease. 3. Slight increase in small volume perihepatic and pelvic ascites. Aortic Atherosclerosis (ICD10-I70.0). Electronically Signed   By: Lucrezia Europe M.D.   On: 07/01/2020 14:23   DG CHEST PORT 1 VIEW  Result Date: 07/12/2020 CLINICAL DATA:  Dyspnea EXAM: PORTABLE CHEST 1 VIEW COMPARISON:  07/11/2020 FINDINGS: Supine chest radiograph. Lung volumes are small and pulmonary insufflation has diminished since prior examination. There is developing consolidation within the left mid lung zone, likely infectious in the acute setting. Linear atelectasis within the right mid lung zone. No pneumothorax or pleural effusion. Cardiac size within normal limits. Pulmonary vascularity is normal. IMPRESSION: Left mid lung zone consolidation, likely infectious. Electronically Signed   By: Fidela Salisbury MD   On: 07/12/2020 06:53   DG CHEST PORT 1 VIEW  Result Date: 07/11/2020 CLINICAL DATA:  Shortness of breath EXAM: PORTABLE CHEST 1 VIEW COMPARISON:  07/10/2020 FINDINGS: Hazy opacity of the left more than right lower chest. No visible effusion or pneumothorax. Normal heart size and mediastinal contours. IMPRESSION: Stable low volume chest with infiltrates or atelectasis  at the bases. Electronically Signed   By: Monte Fantasia M.D.   On: 07/11/2020 07:39   DG CHEST PORT 1 VIEW  Result Date: 07/10/2020 CLINICAL DATA:  Liver carcinoma.  Hypertension. EXAM: PORTABLE CHEST 1 VIEW COMPARISON:  June 26, 2020 FINDINGS: There is consolidation in the left lower lobe with small left pleural effusion. There is ill-defined airspace opacity in the right base. Heart size and pulmonary vascularity are normal. No adenopathy. There is aortic atherosclerosis. No bone lesions. IMPRESSION: Consolidation left lower lobe with small left pleural effusion. Patchy infiltrate right base. Heart size within normal limits. No adenopathy evident. Aortic Atherosclerosis (ICD10-I70.0). Electronically Signed   By: Lowella Grip III M.D.   On: 07/10/2020 13:48   DG Chest Port 1 View  Result Date: 06/26/2020 CLINICAL DATA:  Weakness EXAM: PORTABLE CHEST 1 VIEW COMPARISON:  05/26/2020 FINDINGS: The heart size and mediastinal contours are within normal limits. Both lungs are clear. No pleural effusion or pneumothorax. The visualized skeletal structures are unremarkable. IMPRESSION: No acute process in the chest. Electronically Signed   By: Macy Mis M.D.   On: 06/26/2020 13:45   DG Abd Portable 2V  Result Date: 07/08/2020 CLINICAL DATA:  Nausea and vomiting. EXAM: PORTABLE ABDOMEN - 2 VIEW COMPARISON:  07/01/2020 FINDINGS: Gas distended transverse colon but no overt obstructive pattern. No abnormal stool retention or evidence of small bowel obstruction. Clear lung bases. Calcification in the right iliac fossa from failed transplant based on CT. IMPRESSION: Normal bowel gas pattern.  No abnormal stool retention. Electronically Signed   By: Monte Fantasia M.D.   On: 07/08/2020 11:18   DG Loyce Dys Tube Plc W/Fl W/Rad  Result Date: 07/12/2020 CLINICAL DATA:  Poor fluid intake. Hemodialysis patient. Failed bedside attempt at feeding tube placement. EXAM: FEEDING TUBE PLACEMENT UNDER  FLUOROSCOPY CONTRAST:  Approximately  15 cc Omnipaque 300 per feeding tube. FLUOROSCOPY TIME:  Fluoroscopy Time: 4 minutes and 48 seconds of low-dose pulsed fluoroscopy. Radiation Exposure Index (if provided by the fluoroscopic device): 81.9 mGy Number of Acquired Spot Images: 0 COMPARISON:  Chest CT 07/13/2019 FINDINGS: Time out procedure was performed. A feeding tube was placed via the left nares and advanced into the esophagus and stomach under fluoroscopy. The tube was manipulated into the duodenum with the aid of a stiff Amplatz wire. Tube position in the descending duodenum was confirmed with a spot image following the injection of a small amount of contrast through the tube. The patient tolerated the procedure without immediate complications. IMPRESSION: Feeding tube placement into the mid duodenum as described. Electronically Signed   By: Richardean Sale M.D.   On: 07/12/2020 14:27   EEG adult  Result Date: 07/11/2020 Lora Havens, MD     07/11/2020  3:41 PM Patient Name: KAREMA TOCCI MRN: [id number] Epilepsy Attending: Lora Havens Referring Physician/Provider: Dr Carlisle Cater Date: 07/11/2020 Duration: 24.15 minutes Patient history: 44 year old female with altered mental status.  EEG to evaluate for seizures. Level of alertness: Awake AEDs during EEG study: None Technical aspects: This EEG study was done with scalp electrodes positioned according to the 10-20 International system of electrode placement. Electrical activity was acquired at a sampling rate of _0  and reviewed with a high frequency filter of _1  and a low frequency filter of _2 . EEG data were recorded continuously and digitally stored. Description: The posterior dominant rhythm consists of 8 Hz activity of moderate voltage (25-35 uV) seen predominantly in posterior head regions, symmetric and reactive to eye opening and eye closing.  Hyperventilation and photic stimulation were not performed.   IMPRESSION: This  study is within normal limits. No seizures or epileptiform discharges were seen throughout the recording. Lora Havens   ECHOCARDIOGRAM COMPLETE  Result Date: 07/06/2020    ECHOCARDIOGRAM REPORT   Patient Name:   SANYAH MOLNAR Date of Exam: 07/06/2020 Medical Rec #:  [id number]           Height:       67.0 in Accession #:    [id number]          Weight:       126.3 lb Date of Birth:  1976/02/24            BSA:          1.664 m Patient Age:    65 years            BP:           128/88 mmHg Patient Gender: F                   HR:           131 bpm. Exam Location:  Inpatient Procedure: 2D Echo, Cardiac Doppler and Color Doppler Indications:    R00.2 Palpitations  History:        Patient has no prior history of Echocardiogram examinations.                 Risk Factors:Hypertension. GERD. ESRD.  Sonographer:    Jonelle Sidle Dance Referring Phys: [place]  1. Left ventricular ejection fraction, by estimation, is >75%. The left ventricle has hyperdynamic function. The left ventricle has no regional wall motion abnormalities. Left ventricular diastolic parameters are indeterminate.  2. Right ventricular systolic function is normal. The right ventricular size is normal. There  is normal pulmonary artery systolic pressure.  3. The mitral valve is normal in structure. No evidence of mitral valve regurgitation. No evidence of mitral stenosis.  4. The aortic valve is tricuspid. Aortic valve regurgitation is not visualized. No aortic stenosis is present.  5. The inferior vena cava is normal in size with greater than 50% respiratory variability, suggesting right atrial pressure of 3 mmHg. FINDINGS  Left Ventricle: Left ventricular ejection fraction, by estimation, is >75%. The left ventricle has hyperdynamic function. The left ventricle has no regional wall motion abnormalities. The left ventricular internal cavity size was normal in size. There is no left ventricular hypertrophy. Left ventricular  diastolic parameters are indeterminate. Right Ventricle: The right ventricular size is normal.Right ventricular systolic function is normal. There is normal pulmonary artery systolic pressure. The tricuspid regurgitant velocity is 2.50 m/s, and with an assumed right atrial pressure of 3 mmHg, the estimated right ventricular systolic pressure is 11.0 mmHg. Left Atrium: Left atrial size was normal in size. Right Atrium: Right atrial size was normal in size. Pericardium: Trivial pericardial effusion is present. Mitral Valve: The mitral valve is normal in structure. No evidence of mitral valve regurgitation. No evidence of mitral valve stenosis. Tricuspid Valve: The tricuspid valve is normal in structure. Tricuspid valve regurgitation is mild . No evidence of tricuspid stenosis. Aortic Valve: The aortic valve is tricuspid. Aortic valve regurgitation is not visualized. No aortic stenosis is present. Pulmonic Valve: The pulmonic valve was normal in structure. Pulmonic valve regurgitation is not visualized. No evidence of pulmonic stenosis. Aorta: The aortic root is normal in size and structure. Venous: The inferior vena cava is normal in size with greater than 50% respiratory variability, suggesting right atrial pressure of 3 mmHg.  LEFT VENTRICLE PLAX 2D LVIDd:         3.30 cm LVIDs:         2.50 cm LV PW:         0.90 cm LV IVS:        1.00 cm LVOT diam:     1.90 cm LV SV:         51 LV SV Index:   31 LVOT Area:     2.84 cm  RIGHT VENTRICLE             IVC RV Basal diam:  2.00 cm     IVC diam: 0.80 cm RV S prime:     15.90 cm/s TAPSE (M-mode): 1.9 cm LEFT ATRIUM             Index       RIGHT ATRIUM          Index LA diam:        2.90 cm 1.74 cm/m  RA Area:     6.11 cm LA Vol (A2C):   26.9 ml 16.17 ml/m RA Volume:   8.36 ml  5.03 ml/m LA Vol (A4C):   10.0 ml 6.00 ml/m LA Biplane Vol: 16.5 ml 9.92 ml/m  AORTIC VALVE LVOT Vmax:   112.00 cm/s LVOT Vmean:  78.300 cm/s LVOT VTI:    0.181 m  AORTA Ao Root diam: 2.80 cm  Ao Asc diam:  2.90 cm MITRAL VALVE               TRICUSPID VALVE MV Area (PHT): 4.85 cm    TR Peak grad:   25.0 mmHg MV Decel Time: 157 msec    TR Vmax:        250.00 cm/s  MV E velocity: 98.00 cm/s                            SHUNTS                            Systemic VTI:  0.18 m                            Systemic Diam: 1.90 cm Kirk Ruths MD Electronically signed by Kirk Ruths MD Signature Date/Time: 07/06/2020/2:34:19 PM    Final    VAS [place] LOWER EXTREMITY VENOUS (DVT)  Result Date: 07/09/2020  Lower Venous DVT Study Indications: Pain in thighs bilaterally.  Risk Factors: Cancer Metastatic carcinoma to liver, hx of RCC. Comparison Study: No prior studies. Performing Technologist: Darlin Coco, RDMS  Examination Guidelines: A complete evaluation includes B-mode imaging, spectral Doppler, color Doppler, and power Doppler as needed of all accessible portions of each vessel. Bilateral testing is considered an integral part of a complete examination. Limited examinations for reoccurring indications may be performed as noted. The reflux portion of the exam is performed with the patient in reverse Trendelenburg.  +---------+---------------+---------+-----------+----------+--------------+ RIGHT    CompressibilityPhasicitySpontaneityPropertiesThrombus Aging +---------+---------------+---------+-----------+----------+--------------+ CFV      Full           Yes      Yes                                 +---------+---------------+---------+-----------+----------+--------------+ SFJ      Full                                                        +---------+---------------+---------+-----------+----------+--------------+ FV Prox  Full                                                        +---------+---------------+---------+-----------+----------+--------------+ FV Mid   Full                                                         +---------+---------------+---------+-----------+----------+--------------+ FV DistalFull                                                        +---------+---------------+---------+-----------+----------+--------------+ PFV      Full                                                        +---------+---------------+---------+-----------+----------+--------------+ POP      Full  Yes      Yes                                 +---------+---------------+---------+-----------+----------+--------------+ PTV      Full                                                        +---------+---------------+---------+-----------+----------+--------------+ PERO     Full                                                        +---------+---------------+---------+-----------+----------+--------------+   +---------+---------------+---------+-----------+----------+--------------+ LEFT     CompressibilityPhasicitySpontaneityPropertiesThrombus Aging +---------+---------------+---------+-----------+----------+--------------+ CFV      Full           Yes      Yes                                 +---------+---------------+---------+-----------+----------+--------------+ SFJ      Full                                                        +---------+---------------+---------+-----------+----------+--------------+ FV Prox  Full                                                        +---------+---------------+---------+-----------+----------+--------------+ FV Mid   Full                                                        +---------+---------------+---------+-----------+----------+--------------+ FV DistalFull                                                        +---------+---------------+---------+-----------+----------+--------------+ PFV      Full                                                         +---------+---------------+---------+-----------+----------+--------------+ POP      Full           Yes      Yes                                 +---------+---------------+---------+-----------+----------+--------------+ PTV  Full                                                        +---------+---------------+---------+-----------+----------+--------------+ PERO     Full                                                        +---------+---------------+---------+-----------+----------+--------------+     Summary: RIGHT: - There is no evidence of deep vein thrombosis in the lower extremity.  - No cystic structure found in the popliteal fossa. - Ultrasound characteristics of enlarged lymph nodes are noted in the groin.  LEFT: - There is no evidence of deep vein thrombosis in the lower extremity.  - No cystic structure found in the popliteal fossa. - Ultrasound characteristics of enlarged lymph nodes noted in the groin.  *See table(s) above for measurements and observations. Electronically signed by Curt Jews MD on 07/09/2020 at 4:37:48 PM.    Final    US Abdomen Limited RUQ (LIVER/GB)  Result Date: 06/27/2020 CLINICAL DATA:  Abdominal pain.  Known liver lesions. EXAM: ULTRASOUND ABDOMEN LIMITED RIGHT UPPER QUADRANT COMPARISON:  Noncontrast abdominal CT yesterday. Contrast-enhanced abdominal CT 05/27/2020 FINDINGS: Gallbladder: Physiologically distended containing intraluminal sludge. No shadowing stones. No wall thickening. No sonographic Murphy sign noted by sonographer. Common bile duct: Diameter: 4-5 mm. Liver: Heterogeneous liver. Many of the liver lesions on prior contrast-enhanced CT are not well seen by ultrasound. There is a 1.8 x 1.5 x 1.5 cm complex hypoechoic lesion in the left lobe. No convincing subcapsular collection is suggested on CT. Portal vein is patent on color Doppler imaging with normal direction of blood flow towards the liver. Other: No right upper quadrant  ascites. IMPRESSION: 1. Gallbladder sludge. No gallstones or sonographic findings of acute cholecystitis. 2. No biliary dilatation. 3. Heterogeneous liver with 1.8 cm hypoechoic lesion in the left lobe of the liver. Many of the additional liver lesions on prior contrast-enhanced CT are not well seen by ultrasound. 4. No sonographic evidence of subcapsular collection is suggested on CT. Electronically Signed   By: Keith Rake M.D.   On: 06/27/2020 17:44    Subjective: She is not very awake and responsive today and she will open her eyes to her name but that was all that I got from her today.  She is oriented and knows her birthday.  Still very confused.  I discussed the case with her transplant team at [facility] and a decision was made to transfer her to [facility] for further evaluation and care.  She has been accepted by Dr. Kathaleen Maser hospitalist and will have the transplant team consult accordingly.  She will be discharged and transferred once a bed is available.  Mother was updated and agreeable with the plan of care.  Discharge Exam: Vitals:   07/12/20 1630 07/12/20 1700  BP: 106/80 90/65  Pulse: (!) 107 (!) 107  Resp: (!) 21 18  Temp: 98 F (36.7 C)   SpO2: 93% 96%   Vitals:   07/12/20 0745 07/12/20 1224 07/12/20 1630 07/12/20 1700  BP: 101/76 105/79 106/80 90/65  Pulse: (!) 112 (!) 113 (!) 107 (!) 107  Resp: (!) 24 Marland Kitchen)  25 (!) 21 18  Temp: 98.9 F (37.2 C) 98.6 F (37 C) 98 F (36.7 C)   TempSrc: Oral Oral Axillary   SpO2: 100% 98% 93% 96%  Weight:   72.1 kg   Height:   _0  (1.702 m)    General: Pt is not alert, awake, not in acute distress Cardiovascular: Tachycardic rate but regular rhythm, S1/S2 +, no rubs, no gallops Respiratory: Diminished bilaterally with coarse breath sounds and some rhonchi but no appreciable wheezing Abdominal: Soft, tender to palpate and she grimaces, stented secondary body habitus, bowel sounds + Extremities: 1+ lower extremity edema, no cyanosis  The  results of significant diagnostics from this hospitalization (including imaging, microbiology, ancillary and laboratory) are listed below for reference.    Microbiology: Recent Results (from the past 240 hour(s))  Culture, Urine     Status: None   Collection Time: 07/10/20  5:54 PM   Specimen: Urine, Random  Result Value Ref Range Status   Specimen Description   Final    URINE, RANDOM Performed at [facility] [address], [place], [place] [address]    Special Requests   Final    NONE Performed at [facility], [place] [address], [place], [place] [address]    Culture   Final    NO GROWTH Performed at [facility], [place] [address], [place], [place] [address]    Report Status 07/11/2020 FINAL  Final  Culture, blood (routine x 2)     Status: None (Preliminary result)   Collection Time: 07/11/20 10:47 AM   Specimen: BLOOD LEFT HAND  Result Value Ref Range Status   Specimen Description   Final    BLOOD LEFT HAND Performed at [facility] [address], [place], [place] [address]    Special Requests   Final    BOTTLES DRAWN AEROBIC ONLY Blood Culture results may not be optimal due to an inadequate volume of blood received in culture bottles Performed at [facility] [address], [place], [place] [address]    Culture   Final    NO GROWTH 1 DAY Performed at [facility], [place] [address] [place], [place] [address]    Report Status PENDING  Incomplete  Culture, blood (routine x 2)     Status: None (Preliminary result)   Collection Time: 07/11/20 10:48 AM   Specimen: BLOOD LEFT HAND  Result Value Ref Range Status   Specimen Description   Final    BLOOD LEFT HAND Performed at [facility] [address], [place], [place] [address]    Special Requests   Final    BOTTLES DRAWN AEROBIC ONLY Blood Culture results may not be optimal due to an inadequate volume of blood  received in culture bottles Performed at [facility] [address] [place], [place] [address]    Culture   Final    NO GROWTH 1 DAY Performed at [facility], [place] [address], [place],  [address]    Report Status PENDING  Incomplete     Labs: BNP (last 3 results) No results for input(s): BNP in the last 8760 hours. Basic Metabolic Panel: Recent Labs  Lab 07/07/20 0415 07/08/20 0419 07/09/20 0350 07/10/20 0541 07/10/20 1727 07/11/20 0411 07/12/20 0330  NA 137   < > 137 135 135 137 137  K 4.5   < > 4.3 4.3 3.7 4.2 4.8  CL 106   < >  107 106 106 105 105  CO2 22   < > 22 21* 18* 20* 22  GLUCOSE 117*   < > 144* 148* 145* 109* 146*  BUN 17   < > _0 21*  CREATININE 1.23*   < > 1.16* 1.20* 1.11* 1.16* 1.08*  CALCIUM 8.1*   < > 8.0* 8.1* 7.7* 7.8* 7.4*  MG 1.2*  --  1.9  --  1.3* 2.6* 2.1  PHOS  --   --   --   --  2.3* 2.8 3.1   < > = values in this interval not displayed.   Liver Function Tests: Recent Labs  Lab 07/08/20 0419 07/09/20 0350 07/10/20 1727 07/11/20 0411 07/12/20 0330  AST 46* 50* 50* 53* 55*  ALT 44 38 31 32 29  ALKPHOS 230* 205* 208* 214* 214*  BILITOT 4.3* 4.0* 4.9* 5.2* 5.0*  PROT 5.1* 5.4* 5.1* 5.5* 5.2*  ALBUMIN 1.7* 1.8* 1.7* 1.9* 1.7*   No results for input(s): LIPASE, AMYLASE in the last 168 hours. Recent Labs  Lab 07/10/20 1727  AMMONIA 24   CBC: Recent Labs  Lab 07/08/20 0419 07/10/20 0541 07/10/20 1727 07/11/20 0411 07/12/20 0330  WBC 16.0* 15.8* 15.9* 21.4* 29.3*  NEUTROABS  --   --  13.0* 18.4* 26.9*  HGB 8.1* 8.1* 7.7* 8.0* 7.1*  HCT 25.3* 25.4* 23.3* 24.2* 21.6*  MCV 84.3 84.4 84.4 83.4 83.4  PLT 154 138* 130* 146* 148*   Cardiac Enzymes: Recent Labs  Lab 07/10/20 0541  CKTOTAL 14*   BNP: Invalid input(s): POCBNP CBG: No results for input(s): GLUCAP in the last 168 hours. D-Dimer No results for input(s): DDIMER in the last 72 hours. Hgb A1c No results for input(s):  HGBA1C in the last 72 hours. Lipid Profile No results for input(s): CHOL, HDL, LDLCALC, TRIG, CHOLHDL, LDLDIRECT in the last 72 hours. Thyroid function studies No results for input(s): TSH, T4TOTAL, T3FREE, THYROIDAB in the last 72 hours.  Invalid input(s): FREET3 Anemia work up No results for input(s): VITAMINB12, FOLATE, FERRITIN, TIBC, IRON, RETICCTPCT in the last 72 hours. Urinalysis    Component Value Date/Time   COLORURINE AMBER (A) 07/10/2020 1754   APPEARANCEUR CLEAR 07/10/2020 1754   LABSPEC 1.016 07/10/2020 1754   PHURINE 6.0 07/10/2020 1754   GLUCOSEU NEGATIVE 07/10/2020 1754   HGBUR NEGATIVE 07/10/2020 1754   HGBUR moderate 02/03/2010 1321   BILIRUBINUR SMALL (A) 07/10/2020 1754   BILIRUBINUR negative 11/14/2019 1505   BILIRUBINUR NEG 09/26/2015 1406   KETONESUR NEGATIVE 07/10/2020 1754   PROTEINUR 30 (A) 07/10/2020 1754   UROBILINOGEN 0.2 11/14/2019 1505   UROBILINOGEN 0.2 02/03/2015 1700   NITRITE NEGATIVE 07/10/2020 1754   LEUKOCYTESUR NEGATIVE 07/10/2020 1754   Sepsis Labs Invalid input(s): PROCALCITONIN,  WBC,  LACTICIDVEN Microbiology Recent Results (from the past 240 hour(s))  Culture, Urine     Status: None   Collection Time: 07/10/20  5:54 PM   Specimen: Urine, Random  Result Value Ref Range Status   Specimen Description   Final    URINE, RANDOM Performed at [facility], [place] [address] [place], [place] [address]    Special Requests   Final    NONE Performed at [facility], [place] [address], [place], [place] [address]    Culture   Final    NO GROWTH Performed at [facility], [place] [address] [place], [place] [address]    Report Status 07/11/2020 FINAL  Final  Culture,  blood (routine x 2)     Status: None (Preliminary result)   Collection Time: 07/11/20 10:47 AM   Specimen: BLOOD LEFT HAND  Result Value Ref Range Status   Specimen Description   Final    BLOOD LEFT HAND Performed at [facility] [address] [place], [place] [address]    Special Requests   Final    BOTTLES DRAWN AEROBIC ONLY Blood Culture results may not be optimal due to an inadequate volume of blood received in culture bottles Performed at [facility] [address], [place], [place] [address]    Culture   Final    NO GROWTH 1 DAY Performed at [facility], [place] [address], [place], [place] [address]    Report Status PENDING  Incomplete  Culture, blood (routine x 2)     Status: None (Preliminary result)   Collection Time: 07/11/20 10:48 AM   Specimen: BLOOD LEFT HAND  Result Value Ref Range Status   Specimen Description   Final    BLOOD LEFT HAND Performed at [facility] [address], [place], [place] [address]    Special Requests   Final    BOTTLES DRAWN AEROBIC ONLY Blood Culture results may not be optimal due to an inadequate volume of blood received in culture bottles Performed at [facility] [address], [place], [place] [address]    Culture   Final    NO GROWTH 1 DAY Performed at [facility], [place] [address] [place], [place] [address]    Report Status PENDING  Incomplete   Time coordinating discharge: 35 minutes  SIGNED:  Kerney Elbe, DO [facility] 07/12/2020, 6:18 PM Pager is on [place]  If 7PM-7AM, please contact night-coverage [email or web address]

## 2020-07-12 NOTE — Progress Notes (Addendum)
HEMATOLOGY-ONCOLOGY PROGRESS NOTE  SUBJECTIVE: More encephalopathic this morning.  Opens eyes but does not answer questions.  She reports that she is not eating at all.  They attempted NG tube placement yesterday but could not place the NG tube.  NG tube placement planned in IR later today.  The patient was seen by neurology for AMS who have started her on thiamine and have recommended an MRI of the lumbar spine and pelvis.  Afebrile.  Cultures pending.  Oncology History  Metastatic carcinoma to liver ([place])  05/30/2020 Initial Diagnosis   Metastatic carcinoma to liver ([place])   06/13/2020 -  Chemotherapy   The patient had dexamethasone (DECADRON) 4 MG tablet, 8 mg, Oral, Daily, 1 of 1 cycle, Start date: 06/10/2020, End date: -- palonosetron (ALOXI) injection 0.25 mg, 0.25 mg, Intravenous,  Once, 1 of 4 cycles Administration: 0.25 mg (06/13/2020) pegfilgrastim-jmdb (FULPHILA) injection 6 mg, 6 mg, Subcutaneous,  Once, 1 of 4 cycles Administration: 6 mg (06/15/2020) CARBOplatin (PARAPLATIN) 390 mg in sodium chloride 0.9 % 250 mL chemo infusion, 390 mg (78.9 % of original dose 491.5 mg), Intravenous,  Once, 1 of 4 cycles Dose modification:   (original dose 491.5 mg, Cycle 1) Administration: 390 mg (06/13/2020) fosaprepitant (EMEND) 150 mg in sodium chloride 0.9 % 145 mL IVPB, 150 mg, Intravenous,  Once, 1 of 4 cycles Administration: 150 mg (06/13/2020) PACLitaxel (TAXOL) 54 mg in sodium chloride 0.9 % 150 mL chemo infusion (> 80mg /m2), 30 mg/m2 = 54 mg (60 % of original dose 50 mg/m2), Intravenous,  Once, 1 of 4 cycles Dose modification: 50 mg/m2 (original dose 50 mg/m2, Cycle 1, Reason: Change in LFTs), 30 mg/m2 (original dose 50 mg/m2, Cycle 1, Reason: Change in LFTs) Administration: 54 mg (06/13/2020)  for chemotherapy treatment.    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 with unknown primary site [facility])  06/10/2020 Initial Diagnosis   Carcinoma metastatic to lymph nodes of  multiple sites with unknown primary site [facility])   06/13/2020 -  Chemotherapy   The patient had dexamethasone (DECADRON) 4 MG tablet, 8 mg, Oral, Daily, 1 of 1 cycle, Start date: 06/10/2020, End date: -- palonosetron (ALOXI) injection 0.25 mg, 0.25 mg, Intravenous,  Once, 1 of 4 cycles Administration: 0.25 mg (06/13/2020) pegfilgrastim-jmdb (FULPHILA) injection 6 mg, 6 mg, Subcutaneous,  Once, 1 of 4 cycles Administration: 6 mg (06/15/2020) CARBOplatin (PARAPLATIN) 390 mg in sodium chloride 0.9 % 250 mL chemo infusion, 390 mg (78.9 % of original dose 491.5 mg), Intravenous,  Once, 1 of 4 cycles Dose modification:   (original dose 491.5 mg, Cycle 1) Administration: 390 mg (06/13/2020) fosaprepitant (EMEND) 150 mg in sodium chloride 0.9 % 145 mL IVPB, 150 mg, Intravenous,  Once, 1 of 4 cycles Administration: 150 mg (06/13/2020) PACLitaxel (TAXOL) 54 mg in sodium chloride 0.9 % 150 mL chemo infusion (> 80mg /m2), 30 mg/m2 = 54 mg (60 % of original dose 50 mg/m2), Intravenous,  Once, 1 of 4 cycles Dose modification: 50 mg/m2 (original dose 50 mg/m2, Cycle 1, Reason: Change in LFTs), 30 mg/m2 (original dose 50 mg/m2, Cycle 1, Reason: Change in LFTs) Administration: 54 mg (06/13/2020)  for chemotherapy treatment.       REVIEW OF SYSTEMS:   Unable to obtain due to to encephalopathy.   PHYSICAL EXAMINATION: ECOG PERFORMANCE STATUS: 3 - Symptomatic, >50% confined to bed  Vitals:   07/12/20 0518 07/12/20 0519  BP:    Pulse:    Resp: (!) 25 19  Temp:    SpO2:  Filed Weights   06/26/20 1815  Weight: 57.3 kg    Intake/Output from previous day: 11/18 0701 - 11/19 0700 In: 1394.2 [I.V.:994.2; IV Piggyback:400] Out: 120 [Urine:120]  . GENERAL: Opens eyes, does not answer questions SKIN: no acute rashes, no significant lesions EYES: conjunctiva are pink and non-injected, sclera anicteric OROPHARYNX: MMM, no exudates, no oropharyngeal erythema or ulceration NECK: supple, no  JVD LYMPH:  no palpable lymphadenopathy in the cervical, axillary or inguinal regions LUNGS: clear to auscultation b/l with normal respiratory effort HEART: regular rate & rhythm ABDOMEN: Positive bowel sounds, soft, nontender Extremity: no pedal edema PSYCH: Somnolent but opens eyes  LABORATORY DATA:  I have reviewed the data as listed CMP Latest Ref Rng & Units 07/03/2020 07/01/2020  Glucose 70 - 99 mg/dL 127(H) 102(H)  BUN 6 - 20 mg/dL 14 19  Creatinine 0.44 - 1.00 mg/dL 0.98 1.04(H)  Sodium 135 - 145 mmol/L 135 136  Potassium 3.5 - 5.1 mmol/L 3.7 4.9  Chloride 98 - 111 mmol/L 107 106  CO2 22 - 32 mmol/L 21(L) 19(L)  Calcium 8.9 - 10.3 mg/dL 8.0(L) 8.3(L)  Total Protein 6.5 - 8.1 g/dL 5.6(L) 5.5(L)  Total Bilirubin 0.3 - 1.2 mg/dL 4.0(H) 4.6(H)  Alkaline Phos 38 - 126 U/L 298(H) 389(H)  AST 15 - 41 U/L 65(H) 158(H)  ALT 0 - 44 U/L 124(H) 228(H)   . CBC Latest Ref Rng & Units 07/03/2020 07/01/2020  WBC 4.0 - 10.5 K/uL 15.9(H) 21.9(H)  Hemoglobin 12.0 - 15.0 g/dL 8.3(L) 8.5(L)  Hematocrit 36 - 46 % 25.9(L) 28.1(L)  Platelets 150 - 400 K/uL 189 155   CT ABDOMEN PELVIS WO CONTRAST  Result Date: 06/26/2020 CLINICAL DATA:  Diffuse abdominal pain and vomiting. History of cancer and renal transplant. Liver mass post recent liver biopsy. EXAM: CT ABDOMEN AND PELVIS WITHOUT CONTRAST TECHNIQUE: Multidetector CT imaging of the abdomen and pelvis was performed following the standard protocol without IV contrast. COMPARISON:  Contrast-enhanced exam 05/27/2020. FINDINGS: Lower chest: No focal airspace disease, pleural fluid, or pulmonary nodularity. Heart is normal in size. Hepatobiliary: Patient's known hepatic lesions are not well demonstrated on noncontrast exam. Liver parenchyma is heterogeneous. There may be a small subcapsular hematoma inferiorly from recent liver biopsy, not well-defined on this noncontrast exam. The gallbladder is present. There is high-density material in the gallbladder  lumen, possibly sludge. No calcified gallstone. Common bile duct is poorly defined, but no evidence of biliary dilatation. Pancreas: No ductal dilatation or inflammation. Spleen: Normal in size without focal abnormality. Adrenals/Urinary Tract: Normal adrenal glands. Chronic bilateral native renal atrophy. Solid-appearing lesion in the lower left renal hilum was better appreciated on prior contrast-enhanced CT, grossly unchanged, series 2, image 35. Transplant kidney in the left lower quadrant. Scarring in the left lower pole is better appreciated on prior contrast-enhanced exam. Similar fullness of the renal collecting system without frank hydronephrosis. No transplant perinephric edema. Sequela of failed transplant in the right iliac fossa. The urinary bladder is unremarkable. Stomach/Bowel: Bowel evaluation is limited in the absence of enteric contrast. There is no bowel obstruction or evidence of bowel inflammation. The appendix is normal. Vascular/Lymphatic: Bulky retroperitoneal adenopathy, some of which is low-density and typical of necrosis. Occasional areas of nodal calcification. Adenopathy extends from the retrocrural space to the bilateral common iliac arteries, and left external iliac station. There also enlarged bilateral inguinal lymph nodes, left greater than right. Index aortocaval node measures 4.1 x 2.4 cm, previously 4.2 x 2.6 cm. Overall adenopathy is not  significantly changed in the interim allowing for noncontrast technique. There is aortic and branch atherosclerosis. Reproductive: Bulky uterus with fibroids.  No obvious adnexal mass. Other: There is trace free fluid in the pelvis, slightly increased from prior exam. No other free fluid or ascites. No free air. Musculoskeletal: Ill-defined sclerosis involving right L2 vertebral body, nonspecific but unchanged from previous. Sclerotic density in the left acetabulum. There is hemi transitional lumbosacral anatomy. No fracture or acute osseous  abnormality. IMPRESSION: 1. Patient's known hepatic lesions are not well-defined on this noncontrast exam. There may be a small subcapsular hematoma inferiorly from recent liver biopsy, not well-defined. No evidence of hemoperitoneum. 2. Bulky retroperitoneal and bilateral inguinal adenopathy, grossly unchanged from prior. 3. Transplant kidney in the left lower quadrant with unchanged fullness of the renal collecting system. Sequela of failed transplant in the right iliac fossa. Stable soft tissue density in the region of the native left kidney lower hilum. 4. Bulky uterus with fibroids. 5. Nonspecific sclerosis involving the right aspect of L2 vertebral body. Aortic Atherosclerosis (ICD10-I70.0). Electronically Signed   By: Keith Rake M.D.   On: 06/26/2020 16:45   CT HEAD WO CONTRAST  Result Date: 07/10/2020 CLINICAL DATA:  Delirium, history of urologic malignancy. EXAM: CT HEAD WITHOUT CONTRAST TECHNIQUE: Contiguous axial images were obtained from the base of the skull through the vertex without intravenous contrast. COMPARISON:  MRI 06/28/2020 FINDINGS: Brain: No evidence of acute infarction, hemorrhage, hydrocephalus, extra-axial collection, mass effect or visible mass lesion 4 within the limitations of an unenhanced CT exam. Basal cisterns are patent. Midline intracranial structures are normal. Cerebellar tonsils are normally positioned. Vascular: No hyperdense vessel or unexpected calcification. Skull: Few prominent venous lakes. No convincing focal sclerotic or lytic osseous metastatic disease. A previously seen C2 vertebral body metastasis is not within the margins of imaging. Scalp soft tissues are unremarkable. Sinuses/Orbits: Paranasal sinuses and mastoid air cells are predominantly clear. Included orbital structures are unremarkable. Other: None IMPRESSION: 1. No acute intracranial findings. No visible metastatic lesions within the limitations of unenhanced CT imaging. 2. A previously seen C2  vertebral body metastasis is not within the margins of imaging. Electronically Signed   By: Lovena Le M.D.   On: 07/10/2020 19:59   MR Brain W Wo Contrast  Result Date: 06/28/2020 CLINICAL DATA:  Urologic cancer, staging EXAM: MRI HEAD WITHOUT AND WITH CONTRAST TECHNIQUE: Multiplanar, multiecho pulse sequences of the brain and surrounding structures were obtained without and with intravenous contrast. CONTRAST:  51mL GADAVIST GADOBUTROL 1 MMOL/ML IV SOLN COMPARISON:  MRI head 11/17/2016 FINDINGS: Brain: No acute infarction, hemorrhage, hydrocephalus, extra-axial collection or mass lesion. Normal white matter. Negative for metastatic disease to the brain. No enhancing mass lesion in the brain. Vascular: Normal arterial flow voids. Skull and upper cervical spine: Enhancing lesion in the lower body of the C2 vertebral body. This shows decreased signal prior to contrast and is consistent with metastatic disease. No fracture or cord compression. No skull lesion identified. Benign fatty lesion in the left parietal bone. Sinuses/Orbits: Paranasal sinuses clear.  Negative orbit Other: None IMPRESSION: Negative for metastatic disease to the brain 1 cm enhancing lesion C2 vertebral body compatible with metastatic disease. No fracture or cord compression Electronically Signed   By: Franchot Gallo M.D.   On: 06/28/2020 21:31   CT ABDOMEN PELVIS W CONTRAST  Result Date: 07/01/2020 CLINICAL DATA:  Sepsis, abdominal pain, previous kidney transplant x2. Metastatic carcinoma of unknown primary. EXAM: CT ABDOMEN AND PELVIS WITH CONTRAST TECHNIQUE:  Multidetector CT imaging of the abdomen and pelvis was performed using the standard protocol following bolus administration of intravenous contrast. CONTRAST:  37mL OMNIPAQUE IOHEXOL 300 MG/ML  SOLN COMPARISON:  06/26/2020 and previous FINDINGS: Lower chest: Trace pleural effusions. Hepatobiliary: Multiple liver lesions. 3.2 cm segment 2 lesion shows definite progression since  05/27/2020, although is more wedge-shaped and may represent focal infarct. Multiple low-attenuation lesions in the right lobe have not convincingly changed allowing for differences in scan timing. There are nonspecific transient hepatic attenuation differences on the portal venous phase which resolve on delayed venous phase imaging. No biliary ductal dilatation. Gallbladder unremarkable. Pancreas: Unremarkable. No pancreatic ductal dilatation or surrounding inflammatory changes. Spleen: Normal in size. There peripheral areas of decreased enhancement which were not present on prior study of 05/27/2020, however this area was not included on the delayed scan, and may represent transient attenuation differences versus true lesions. Adrenals/Urinary Tract: Adrenal glands unremarkable. Marked atrophy of native kidneys. Normal enhancement of left iliac fossa transplant kidney. Coarse calcifications and low-attenuation in the right iliac fossa presumably related to remote transplant. The urinary bladder is incompletely distended. Stomach/Bowel: Stomach is decompressed. Small bowel is nondilated. The colon is incompletely distended, unremarkable. Vascular/Lymphatic: Bulky bilateral inguinal, left external iliac, bilateral common iliac, left para-aortic and aortocaval adenopathy as before. The larger nodes contain scattered calcifications and some with central low-attenuation regions as before. Moderate atheromatous aortoiliac plaque without aneurysm. Reproductive: Uterine fibroid.  No adnexal mass. Other: Small volume perihepatic and pelvic ascites slightly increased. No free air. Musculoskeletal: Poorly marginated sclerotic lesions in T12, L1, and L2 vertebral bodies, progressive since 05/16/2020. benign left supra-acetabular bone island stable since 02/03/2015. IMPRESSION: 1. No definite source of sepsis identified. 2. Extensive pelvic and retroperitoneal nodal; hepatic; and osseous metastatic disease. 3. Slight increase  in small volume perihepatic and pelvic ascites. Aortic Atherosclerosis (ICD10-I70.0). Electronically Signed   By: Lucrezia Europe M.D.   On: 07/01/2020 14:23   DG CHEST PORT 1 VIEW  Result Date: 07/12/2020 CLINICAL DATA:  Dyspnea EXAM: PORTABLE CHEST 1 VIEW COMPARISON:  07/11/2020 FINDINGS: Supine chest radiograph. Lung volumes are small and pulmonary insufflation has diminished since prior examination. There is developing consolidation within the left mid lung zone, likely infectious in the acute setting. Linear atelectasis within the right mid lung zone. No pneumothorax or pleural effusion. Cardiac size within normal limits. Pulmonary vascularity is normal. IMPRESSION: Left mid lung zone consolidation, likely infectious. Electronically Signed   By: Fidela Salisbury MD   On: 07/12/2020 06:53   DG CHEST PORT 1 VIEW  Result Date: 07/11/2020 CLINICAL DATA:  Shortness of breath EXAM: PORTABLE CHEST 1 VIEW COMPARISON:  07/10/2020 FINDINGS: Hazy opacity of the left more than right lower chest. No visible effusion or pneumothorax. Normal heart size and mediastinal contours. IMPRESSION: Stable low volume chest with infiltrates or atelectasis at the bases. Electronically Signed   By: Monte Fantasia M.D.   On: 07/11/2020 07:39   DG CHEST PORT 1 VIEW  Result Date: 07/10/2020 CLINICAL DATA:  Liver carcinoma.  Hypertension. EXAM: PORTABLE CHEST 1 VIEW COMPARISON:  June 26, 2020 FINDINGS: There is consolidation in the left lower lobe with small left pleural effusion. There is ill-defined airspace opacity in the right base. Heart size and pulmonary vascularity are normal. No adenopathy. There is aortic atherosclerosis. No bone lesions. IMPRESSION: Consolidation left lower lobe with small left pleural effusion. Patchy infiltrate right base. Heart size within normal limits. No adenopathy evident. Aortic Atherosclerosis (ICD10-I70.0). Electronically Signed  By: Lowella Grip III M.D.   On: 07/10/2020 13:48   DG  Chest Port 1 View  Result Date: 06/26/2020 CLINICAL DATA:  Weakness EXAM: PORTABLE CHEST 1 VIEW COMPARISON:  05/26/2020 FINDINGS: The heart size and mediastinal contours are within normal limits. Both lungs are clear. No pleural effusion or pneumothorax. The visualized skeletal structures are unremarkable. IMPRESSION: No acute process in the chest. Electronically Signed   By: Macy Mis M.D.   On: 06/26/2020 13:45   DG Abd Portable 2V  Result Date: 07/08/2020 CLINICAL DATA:  Nausea and vomiting. EXAM: PORTABLE ABDOMEN - 2 VIEW COMPARISON:  07/01/2020 FINDINGS: Gas distended transverse colon but no overt obstructive pattern. No abnormal stool retention or evidence of small bowel obstruction. Clear lung bases. Calcification in the right iliac fossa from failed transplant based on CT. IMPRESSION: Normal bowel gas pattern.  No abnormal stool retention. Electronically Signed   By: Monte Fantasia M.D.   On: 07/08/2020 11:18   EEG adult  Result Date: 07/11/2020 Lora Havens, MD     07/11/2020  3:41 PM Patient Name: Candace Griffin MRN: [id number] Epilepsy Attending: Lora Havens Referring Physician/Provider: Dr Carlisle Cater Date: 07/11/2020 Duration: 24.15 minutes Patient history: 44 year old female with altered mental status.  EEG to evaluate for seizures. Level of alertness: Awake AEDs during EEG study: None Technical aspects: This EEG study was done with scalp electrodes positioned according to the 10-20 International system of electrode placement. Electrical activity was acquired at a sampling rate of 500Hz  and reviewed with a high frequency filter of 70Hz  and a low frequency filter of 1Hz . EEG data were recorded continuously and digitally stored. Description: The posterior dominant rhythm consists of 8 Hz activity of moderate voltage (25-35 uV) seen predominantly in posterior head regions, symmetric and reactive to eye opening and eye closing.  Hyperventilation and photic stimulation  were not performed.   IMPRESSION: This study is within normal limits. No seizures or epileptiform discharges were seen throughout the recording. Lora Havens   ECHOCARDIOGRAM COMPLETE  Result Date: 07/06/2020    ECHOCARDIOGRAM REPORT   Patient Name:   Candace Griffin Date of Exam: 07/06/2020 Medical Rec #:  [id number]           Height:       67.0 in Accession #:    [id number]          Weight:       126.3 lb Date of Birth:  Jan 09, 1976            BSA:          1.664 m Patient Age:    80 years            BP:           128/88 mmHg Patient Gender: F                   HR:           131 bpm. Exam Location:  Inpatient Procedure: 2D Echo, Cardiac Doppler and Color Doppler Indications:    R00.2 Palpitations  History:        Patient has no prior history of Echocardiogram examinations.                 Risk Factors:Hypertension. GERD. ESRD.  Sonographer:    Jonelle Sidle Dance Referring Phys: [place]  1. Left ventricular ejection fraction, by estimation, is >75%. The left ventricle has hyperdynamic function. The left  ventricle has no regional wall motion abnormalities. Left ventricular diastolic parameters are indeterminate.  2. Right ventricular systolic function is normal. The right ventricular size is normal. There is normal pulmonary artery systolic pressure.  3. The mitral valve is normal in structure. No evidence of mitral valve regurgitation. No evidence of mitral stenosis.  4. The aortic valve is tricuspid. Aortic valve regurgitation is not visualized. No aortic stenosis is present.  5. The inferior vena cava is normal in size with greater than 50% respiratory variability, suggesting right atrial pressure of 3 mmHg. FINDINGS  Left Ventricle: Left ventricular ejection fraction, by estimation, is >75%. The left ventricle has hyperdynamic function. The left ventricle has no regional wall motion abnormalities. The left ventricular internal cavity size was normal in size. There is no left  ventricular hypertrophy. Left ventricular diastolic parameters are indeterminate. Right Ventricle: The right ventricular size is normal.Right ventricular systolic function is normal. There is normal pulmonary artery systolic pressure. The tricuspid regurgitant velocity is 2.50 m/s, and with an assumed right atrial pressure of 3 mmHg, the estimated right ventricular systolic pressure is 12.4 mmHg. Left Atrium: Left atrial size was normal in size. Right Atrium: Right atrial size was normal in size. Pericardium: Trivial pericardial effusion is present. Mitral Valve: The mitral valve is normal in structure. No evidence of mitral valve regurgitation. No evidence of mitral valve stenosis. Tricuspid Valve: The tricuspid valve is normal in structure. Tricuspid valve regurgitation is mild . No evidence of tricuspid stenosis. Aortic Valve: The aortic valve is tricuspid. Aortic valve regurgitation is not visualized. No aortic stenosis is present. Pulmonic Valve: The pulmonic valve was normal in structure. Pulmonic valve regurgitation is not visualized. No evidence of pulmonic stenosis. Aorta: The aortic root is normal in size and structure. Venous: The inferior vena cava is normal in size with greater than 50% respiratory variability, suggesting right atrial pressure of 3 mmHg.  LEFT VENTRICLE PLAX 2D LVIDd:         3.30 cm LVIDs:         2.50 cm LV PW:         0.90 cm LV IVS:        1.00 cm LVOT diam:     1.90 cm LV SV:         51 LV SV Index:   31 LVOT Area:     2.84 cm  RIGHT VENTRICLE             IVC RV Basal diam:  2.00 cm     IVC diam: 0.80 cm RV S prime:     15.90 cm/s TAPSE (M-mode): 1.9 cm LEFT ATRIUM             Index       RIGHT ATRIUM          Index LA diam:        2.90 cm 1.74 cm/m  RA Area:     6.11 cm LA Vol (A2C):   26.9 ml 16.17 ml/m RA Volume:   8.36 ml  5.03 ml/m LA Vol (A4C):   10.0 ml 6.00 ml/m LA Biplane Vol: 16.5 ml 9.92 ml/m  AORTIC VALVE LVOT Vmax:   112.00 cm/s LVOT Vmean:  78.300 cm/s LVOT  VTI:    0.181 m  AORTA Ao Root diam: 2.80 cm Ao Asc diam:  2.90 cm MITRAL VALVE               TRICUSPID VALVE MV Area (PHT): 4.85 cm  TR Peak grad:   25.0 mmHg MV Decel Time: 157 msec    TR Vmax:        250.00 cm/s MV E velocity: 98.00 cm/s                            SHUNTS                            Systemic VTI:  0.18 m                            Systemic Diam: 1.90 cm Kirk Ruths MD Electronically signed by Kirk Ruths MD Signature Date/Time: 07/06/2020/2:34:19 PM    Final    VAS [place] LOWER EXTREMITY VENOUS (DVT)  Result Date: 07/09/2020  Lower Venous DVT Study Indications: Pain in thighs bilaterally.  Risk Factors: Cancer Metastatic carcinoma to liver, hx of RCC. Comparison Study: No prior studies. Performing Technologist: Darlin Coco, RDMS  Examination Guidelines: A complete evaluation includes B-mode imaging, spectral Doppler, color Doppler, and power Doppler as needed of all accessible portions of each vessel. Bilateral testing is considered an integral part of a complete examination. Limited examinations for reoccurring indications may be performed as noted. The reflux portion of the exam is performed with the patient in reverse Trendelenburg.  +---------+---------------+---------+-----------+----------+--------------+ RIGHT    CompressibilityPhasicitySpontaneityPropertiesThrombus Aging +---------+---------------+---------+-----------+----------+--------------+ CFV      Full           Yes      Yes                                 +---------+---------------+---------+-----------+----------+--------------+ SFJ      Full                                                        +---------+---------------+---------+-----------+----------+--------------+ FV Prox  Full                                                        +---------+---------------+---------+-----------+----------+--------------+ FV Mid   Full                                                         +---------+---------------+---------+-----------+----------+--------------+ FV DistalFull                                                        +---------+---------------+---------+-----------+----------+--------------+ PFV      Full                                                        +---------+---------------+---------+-----------+----------+--------------+  POP      Full           Yes      Yes                                 +---------+---------------+---------+-----------+----------+--------------+ PTV      Full                                                        +---------+---------------+---------+-----------+----------+--------------+ PERO     Full                                                        +---------+---------------+---------+-----------+----------+--------------+   +---------+---------------+---------+-----------+----------+--------------+ LEFT     CompressibilityPhasicitySpontaneityPropertiesThrombus Aging +---------+---------------+---------+-----------+----------+--------------+ CFV      Full           Yes      Yes                                 +---------+---------------+---------+-----------+----------+--------------+ SFJ      Full                                                        +---------+---------------+---------+-----------+----------+--------------+ FV Prox  Full                                                        +---------+---------------+---------+-----------+----------+--------------+ FV Mid   Full                                                        +---------+---------------+---------+-----------+----------+--------------+ FV DistalFull                                                        +---------+---------------+---------+-----------+----------+--------------+ PFV      Full                                                         +---------+---------------+---------+-----------+----------+--------------+ POP      Full           Yes      Yes                                 +---------+---------------+---------+-----------+----------+--------------+  PTV      Full                                                        +---------+---------------+---------+-----------+----------+--------------+ PERO     Full                                                        +---------+---------------+---------+-----------+----------+--------------+     Summary: RIGHT: - There is no evidence of deep vein thrombosis in the lower extremity.  - No cystic structure found in the popliteal fossa. - Ultrasound characteristics of enlarged lymph nodes are noted in the groin.  LEFT: - There is no evidence of deep vein thrombosis in the lower extremity.  - No cystic structure found in the popliteal fossa. - Ultrasound characteristics of enlarged lymph nodes noted in the groin.  *See table(s) above for measurements and observations. Electronically signed by Curt Jews MD on 07/09/2020 at 4:37:48 PM.    Final    US Abdomen Limited RUQ (LIVER/GB)  Result Date: 06/27/2020 CLINICAL DATA:  Abdominal pain.  Known liver lesions. EXAM: ULTRASOUND ABDOMEN LIMITED RIGHT UPPER QUADRANT COMPARISON:  Noncontrast abdominal CT yesterday. Contrast-enhanced abdominal CT 05/27/2020 FINDINGS: Gallbladder: Physiologically distended containing intraluminal sludge. No shadowing stones. No wall thickening. No sonographic Murphy sign noted by sonographer. Common bile duct: Diameter: 4-5 mm. Liver: Heterogeneous liver. Many of the liver lesions on prior contrast-enhanced CT are not well seen by ultrasound. There is a 1.8 x 1.5 x 1.5 cm complex hypoechoic lesion in the left lobe. No convincing subcapsular collection is suggested on CT. Portal vein is patent on color Doppler imaging with normal direction of blood flow towards the liver. Other: No right upper quadrant  ascites. IMPRESSION: 1. Gallbladder sludge. No gallstones or sonographic findings of acute cholecystitis. 2. No biliary dilatation. 3. Heterogeneous liver with 1.8 cm hypoechoic lesion in the left lobe of the liver. Many of the additional liver lesions on prior contrast-enhanced CT are not well seen by ultrasound. 4. No sonographic evidence of subcapsular collection is suggested on CT. Electronically Signed   By: Keith Rake M.D.   On: 06/27/2020 17:44    ASSESSMENT: 44 yo with   1) Metastatic renal papillary cell carcinoma. Extensive metastases to abdominal pelvic lymph nodes, bones and liver.  Patient with h/o renal transplantation on tacrolimus, prednisone and cellcept chronic immunosuppression with extensive metastatic disease with extensive abd/pelvic LNadenopathy and hepatic metastases.  IHC from LN and liver metastases -- non specific morphology and IC ? Gyn vs renal tubular origin. Tumor markers unrevealing However tissue profiling with cancer type YY-50% certainty of papillary renal cell carcinoma.;  Patient received 1 cycle of carbotaxol and has been switched to Cabometyx. Component     Latest Ref Rng & Units 05/29/2020 05/31/2020  CEA     0.0 - 4.7 ng/mL 0.9   AFP, Serum, Tumor Marker     0.0 - 8.3 ng/mL 1.7   CA 19-9     0 - 35 U/mL 24   Cancer Antigen (CA) 125     0.0 - 38.1 U/mL 20.1   hCG Quant     mIU/mL 3  LDH     98 - 192 U/L  178    2) severe systemic inflammatory response syndrome  3) transaminitis and hyperbilirubinemia  4) AKI with hyperkalemia, improved  5) leukocytosis  6) anemia    PLAN: -Has worsening encephalopathy.  Ammonia level was normal.  CT scan of the head showed no acute findings.  Cultures pending.  Chest x-ray shows possible pneumonia.  Rising WBC.  Neurology is following.  Discussed with hospitalist who also plans to consult infectious disease due to rising white blood cell count.  They also plan to repeat CT scans of the  chest/abdomen/pelvis as well as obtain the MRI of the brain, lumbar spine and sacrum per neurology. -Recommend stopping any medication that can cause sedation. -Cabometyx dose was increased to 40 mg earlier this week and reduce back down to 20 mg daily on 11/18.   -will hold cabometyx at this time pending improvement in mental status and clarity regarding status of "sepsis". -Continue control of her nausea. vomiting, abdominal pain.  Recommend increasing p.o. intake and dietitian consult.  NG tube placement planned today in IR. -MRI of the brain has been obtained which was negative for metastatic disease to the brain.  It did show a 1 cm enhancing lesion at the C2 vertebral body compatible with metastatic disease.  Repeat MRI of the brain has been ordered. -We will plan to start the patient on Zometa when stable and out of the hospital for metastatic bone disease. -Her case was discussed with her renal transplant physician Aquilla Hacker at [facility] at [phone].  Discussed to determine options for reducing her immune suppression.  He noted there might be advantages in trying to determine if renal cell cancer is often donor kidney origin versus native kidney to determine potential effectiveness of backing off on immune suppression. -I discussed with the patient that there is an important role of backing off on immune suppression to try to help her body fight of the cancer.  The downside risk would be that she could have renal function loss from rejection and may land up on dialysis. -Dr Altamease Oiler will be discussing with his pathologist at [facility] to know what testing to send out to determine donor versus native kidney origin of her renal cell carcinoma. -Tacrolimus level resulted at 10.4.  Hospitalist contacted nephrology at [facility] who wishes to continue CellCept for now. -Oncology will continue to follow -Transfuse as needed for hemoglobin less than 8.  Hemoglobin 7.1 today and recommend 1 unit PRBCs.  Kristin  Curcio DNP  LOS: 16 days    ADDENDUM  .Patient was Personally and independently interviewed, examined and relevant elements of the history of present illness were reviewed in details and an assessment and plan was created. All elements of the patient's history of present illness , assessment and plan were discussed in details with Mikey Bussing DNP. The above documentation reflects our combined findings assessment and plan. -Holding Cabometyx at this time pending improvement in mental status, po intake and clarity regarding "sepsis" and etiology for elevated procalcitonin levels.  Sullivan Lone MD MS

## 2020-07-12 NOTE — Progress Notes (Signed)
NUTRITION NOTE  Consult received for initiation and management of TF. Patient seen for full initially assessment by this RD yesterday and recommendation was made for small bore NGT placement by IR under fluoroscopy to the Ligament of Treitz.   Imaging report note states that tip of tube is in the mid-duodenum.   Will order TF regimen of Osmolite 1.5 @ 20 ml/hr to increase by 10 ml every 12 hours to reach goal rate of 60 ml/hr with 45 ml Prosource TF BID.  At goal rate, this regimen will provide 2240 kcal, 112 grams protein, and 1097 ml free water.   Patient was transferred from 4E to 2W earlier today.   Estimated Nutritional Needs:  Kcal:  2005-2290 kcal Protein:  105-120 grams Fluid:  >/= 2.2 L/day     Jarome Matin, MS, RD, LDN, CNSC Inpatient Clinical Dietitian RD pager # available in [facility]  After hours/weekend pager # available in [facility]

## 2020-07-12 NOTE — Progress Notes (Signed)
Blood bank was called at this time about Pt's blood being ready, Blood bank stated they will call writer when blood is ready, noted.

## 2020-07-12 NOTE — Progress Notes (Signed)
Pt transferred to ICU/Stepdown room 1224 at this time, noted. Pt in stable condition.

## 2020-07-12 NOTE — Consult Note (Signed)
[place] for Infectious Disease       Reason for Consult: leukocytosis    Referring Physician: Dr. Alfredia Ferguson  Principal Problem:   Pancreatitis Active Problems:   GERD   Status post kidney transplant   Hypertension   Symptomatic anemia   Metastatic carcinoma to liver (HCC)   AKI (acute kidney injury) ([place])   Hepatitis   Generalized abdominal pain   Renal cell carcinoma (HCC)   Non-intractable vomiting   Malnutrition of moderate degree   . cabozantinib  20 mg Oral Daily  . enoxaparin (LOVENOX) injection  40 mg Subcutaneous QHS  . mouth rinse  15 mL Mouth Rinse BID  . metoCLOPramide (REGLAN) injection  10 mg Intravenous Q6H  . metoprolol tartrate  5 mg Intravenous Q8H  . mycophenolate  500 mg Oral BID  . oxyCODONE  20 mg Oral BID  . pantoprazole  40 mg Oral BID  . predniSONE  5 mg Oral Q breakfast  . promethazine  12.5 mg Intravenous TID AC  . scopolamine  1 patch Transdermal Q72H  . sodium chloride flush  10-40 mL Intracatheter Q12H  . sucralfate  1 g Oral TID WC & HS  . Tacrolimus ER  4 mg Oral Daily    Recommendations:  stop antibiotics  Assessment: She has some leukocytosis up from her recent baseline but no fever, no symptoms of pneumonia with no sob, no cough, no hypoxia, breathing on room air, negative cultures to date.  No indication for antibiotics at this time.   Encephalopathy likely multifactorial.  Followed by neurology.    Antibiotics: Azithromycin and ceftriaxone.   HPI: Candace Griffin is a 44 y.o. female with a history of chronic glomerulonephritis s/p renal transplant and now diagnosed with renal papillary cell carcinoma with metastasis to pelvic lymph nodes, bones and liver.  She was admitted 11/3 with abdominal pain, n/v.  She had some leukocytosis then and started on empiric antibiotics but no positive cultures were noted and antibiotics stopped on 11/8.  She has remained hospitalized due to persistent nausea and poor intake.   Cabozantinib was started on 11/8. She has been more encephalopathic recently of unknown etiology. Over the last 2 days her leukocytosis has increased to 29.3.  She was started on azithromycin and ceftriaxone for pneumonia.  She has had no hypoxia, no sob, no cough.  Blood cultures have been sent.    Review of Systems:  Constitutional: negative for fevers and chills; no headaches, no vision changes Respiratory: negative for cough, sputum, hemoptysis or pleurisy/chest pain Integument/breast: negative for rash Musculoskeletal: negative for myalgias and arthralgias All other systems reviewed and are negative    Past Medical History:  Diagnosis Date  . Chronic glomerulonephritis   . Dialysis patient ([place])   . End stage renal disease ([place])   . GERD (gastroesophageal reflux disease)   . History of renal transplant 09/30/2011  . Hypertension     Social History   Tobacco Use  . Smoking status: Never Smoker  . Smokeless tobacco: Never Used  Substance Use Topics  . Alcohol use: No  . Drug use: No    Family History  Problem Relation Age of Onset  . Diabetes Father   . Diabetes Paternal Aunt   . Diabetes Paternal Grandmother   . Alcohol abuse Paternal Grandmother   . Alcohol abuse Paternal Uncle   . Cancer Maternal Grandmother        breast  . Breast cancer Maternal Grandmother   .  Alcohol abuse Paternal Grandfather     Allergies  Allergen Reactions  . Lisinopril Cough  . Tape Rash and Other (See Comments)    Causes welts also    Physical Exam: Constitutional: in no apparent distress  Vitals:   07/12/20 0745 07/12/20 1224  BP: 101/76 105/79  Pulse: (!) 112 (!) 113  Resp: (!) 24 (!) 25  Temp: 98.9 F (37.2 C) 98.6 F (37 C)  SpO2: 100% 98%   EYES: anicteric Cardiovascular: Cor RRR Respiratory: clear; GI: Bowel sounds are normal, liver is not enlarged, spleen is not enlarged Musculoskeletal: no pedal edema noted Skin: negatives: no rash Neuro: alert, responds  appropriately to questions  Lab Results  Component Value Date   WBC 29.3 (H) 07/12/2020   HGB 7.1 (L) 07/12/2020   HCT 21.6 (L) 07/12/2020   MCV 83.4 07/12/2020   PLT 148 (L) 07/12/2020    Lab Results  Component Value Date   CREATININE 1.08 (H) 07/12/2020   BUN 21 (H) 07/12/2020   NA 137 07/12/2020   K 4.8 07/12/2020   CL 105 07/12/2020   CO2 22 07/12/2020    Lab Results  Component Value Date   ALT 29 07/12/2020   AST 55 (H) 07/12/2020   ALKPHOS 214 (H) 07/12/2020     Microbiology: Recent Results (from the past 240 hour(s))  Culture, Urine     Status: None   Collection Time: 07/10/20  5:54 PM   Specimen: Urine, Random  Result Value Ref Range Status   Specimen Description   Final    URINE, RANDOM Performed at [facility], [place] [address] [place], [place] [address]    Special Requests   Final    NONE Performed at [facility], [place] [address], [place], [place] [address]    Culture   Final    NO GROWTH Performed at [facility], [place] [address] [place], [place] [address]    Report Status 07/11/2020 FINAL  Final  Culture, blood (routine x 2)     Status: None (Preliminary result)   Collection Time: 07/11/20 10:47 AM   Specimen: BLOOD LEFT HAND  Result Value Ref Range Status   Specimen Description   Final    BLOOD LEFT HAND Performed at [place] [address], [place], [place] [address]    Special Requests   Final    BOTTLES DRAWN AEROBIC ONLY Blood Culture results may not be optimal due to an inadequate volume of blood received in culture bottles Performed at [place] [address] [place], [place] [address]    Culture   Final    NO GROWTH 1 DAY Performed at [facility], [place] [address], [place], [place] [address]    Report Status PENDING  Incomplete  Culture, blood (routine x 2)     Status: None (Preliminary result)   Collection Time: 07/11/20 10:48 AM    Specimen: BLOOD LEFT HAND  Result Value Ref Range Status   Specimen Description   Final    BLOOD LEFT HAND Performed at [place] [address], [place], [place] [address]    Special Requests   Final    BOTTLES DRAWN AEROBIC ONLY Blood Culture results may not be optimal due to an inadequate volume of blood received in culture bottles Performed at [place] [address], [place], [place] [address]    Culture   Final    NO  GROWTH 1 DAY Performed at [facility], [place] [address] [place], [place] [address]    Report Status PENDING  Incomplete    Thayer Headings, [place] for Infectious Disease [place] [email or web address] 07/12/2020, 2:10 PM

## 2020-07-13 ENCOUNTER — Inpatient Hospital Stay (HOSPITAL_COMMUNITY): Payer: Medicare Other

## 2020-07-13 DIAGNOSIS — D72829 Elevated white blood cell count, unspecified: Secondary | ICD-10-CM | POA: Diagnosis not present

## 2020-07-13 DIAGNOSIS — R579 Shock, unspecified: Secondary | ICD-10-CM | POA: Diagnosis not present

## 2020-07-13 DIAGNOSIS — D849 Immunodeficiency, unspecified: Secondary | ICD-10-CM

## 2020-07-13 DIAGNOSIS — C787 Secondary malignant neoplasm of liver and intrahepatic bile duct: Secondary | ICD-10-CM | POA: Diagnosis not present

## 2020-07-13 DIAGNOSIS — K859 Acute pancreatitis without necrosis or infection, unspecified: Secondary | ICD-10-CM | POA: Diagnosis not present

## 2020-07-13 DIAGNOSIS — R112 Nausea with vomiting, unspecified: Secondary | ICD-10-CM | POA: Diagnosis not present

## 2020-07-13 DIAGNOSIS — N179 Acute kidney failure, unspecified: Secondary | ICD-10-CM | POA: Diagnosis not present

## 2020-07-13 LAB — CBC WITH DIFFERENTIAL/PLATELET
Abs Immature Granulocytes: 0.18 10*3/uL — ABNORMAL HIGH (ref 0.00–0.07)
Basophils Absolute: 0 10*3/uL (ref 0.0–0.1)
Basophils Relative: 0 %
Eosinophils Absolute: 0 10*3/uL (ref 0.0–0.5)
Eosinophils Relative: 0 %
HCT: 26.9 % — ABNORMAL LOW (ref 36.0–46.0)
Hemoglobin: 8.6 g/dL — ABNORMAL LOW (ref 12.0–15.0)
Immature Granulocytes: 1 %
Lymphocytes Relative: 5 %
Lymphs Abs: 1 10*3/uL (ref 0.7–4.0)
MCH: 27 pg (ref 26.0–34.0)
MCHC: 32 g/dL (ref 30.0–36.0)
MCV: 84.3 fL (ref 80.0–100.0)
Monocytes Absolute: 1.2 10*3/uL — ABNORMAL HIGH (ref 0.1–1.0)
Monocytes Relative: 6 %
Neutro Abs: 19.1 10*3/uL — ABNORMAL HIGH (ref 1.7–7.7)
Neutrophils Relative %: 88 %
Platelets: 133 10*3/uL — ABNORMAL LOW (ref 150–400)
RBC: 3.19 MIL/uL — ABNORMAL LOW (ref 3.87–5.11)
RDW: 23.6 % — ABNORMAL HIGH (ref 11.5–15.5)
WBC: 21.5 10*3/uL — ABNORMAL HIGH (ref 4.0–10.5)
nRBC: 0.1 % (ref 0.0–0.2)

## 2020-07-13 LAB — COMPREHENSIVE METABOLIC PANEL
ALT: 30 U/L (ref 0–44)
AST: 70 U/L — ABNORMAL HIGH (ref 15–41)
Albumin: 1.8 g/dL — ABNORMAL LOW (ref 3.5–5.0)
Alkaline Phosphatase: 298 U/L — ABNORMAL HIGH (ref 38–126)
Anion gap: 11 (ref 5–15)
BUN: 24 mg/dL — ABNORMAL HIGH (ref 6–20)
CO2: 20 mmol/L — ABNORMAL LOW (ref 22–32)
Calcium: 7.6 mg/dL — ABNORMAL LOW (ref 8.9–10.3)
Chloride: 109 mmol/L (ref 98–111)
Creatinine, Ser: 1.28 mg/dL — ABNORMAL HIGH (ref 0.44–1.00)
GFR, Estimated: 53 mL/min — ABNORMAL LOW (ref >60)
Glucose, Bld: 111 mg/dL — ABNORMAL HIGH (ref 70–99)
Potassium: 4.5 mmol/L (ref 3.5–5.1)
Sodium: 140 mmol/L (ref 135–145)
Total Bilirubin: 4.6 mg/dL — ABNORMAL HIGH (ref 0.3–1.2)
Total Protein: 5.1 g/dL — ABNORMAL LOW (ref 6.5–8.1)

## 2020-07-13 LAB — PHOSPHORUS
Phosphorus: 2.5 mg/dL (ref 2.5–4.6)
Phosphorus: 2.5 mg/dL (ref 2.5–4.6)

## 2020-07-13 LAB — GLUCOSE, CAPILLARY
Glucose-Capillary: 106 mg/dL — ABNORMAL HIGH (ref 70–99)
Glucose-Capillary: 117 mg/dL — ABNORMAL HIGH (ref 70–99)
Glucose-Capillary: 118 mg/dL — ABNORMAL HIGH (ref 70–99)
Glucose-Capillary: 135 mg/dL — ABNORMAL HIGH (ref 70–99)
Glucose-Capillary: 168 mg/dL — ABNORMAL HIGH (ref 70–99)
Glucose-Capillary: 170 mg/dL — ABNORMAL HIGH (ref 70–99)

## 2020-07-13 LAB — LACTIC ACID, PLASMA: Lactic Acid, Venous: 1 mmol/L (ref 0.5–1.9)

## 2020-07-13 LAB — PROCALCITONIN: Procalcitonin: 9.3 ng/mL

## 2020-07-13 LAB — MAGNESIUM
Magnesium: 1.9 mg/dL (ref 1.7–2.4)
Magnesium: 1.9 mg/dL (ref 1.7–2.4)

## 2020-07-13 IMAGING — US US ABDOMEN LIMITED
1 series · 14 of 25 positions shown · non-contrast
Comparison: Ultrasound [DATE]. CT scan [DATE].

CLINICAL DATA: Concern for cholecystitis.

EXAM:
ULTRASOUND ABDOMEN LIMITED RIGHT UPPER QUADRANT

[Series 1: us abdomen limited · 14 of 58 slices shown]
[im 1/58]
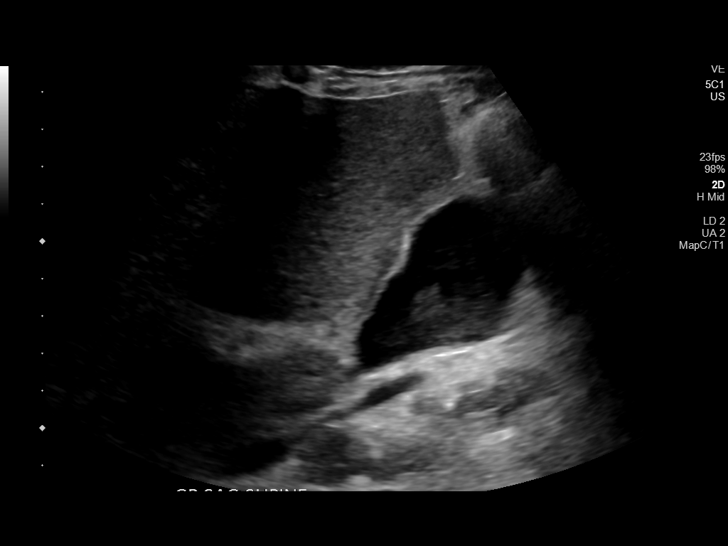
[im 5/58]
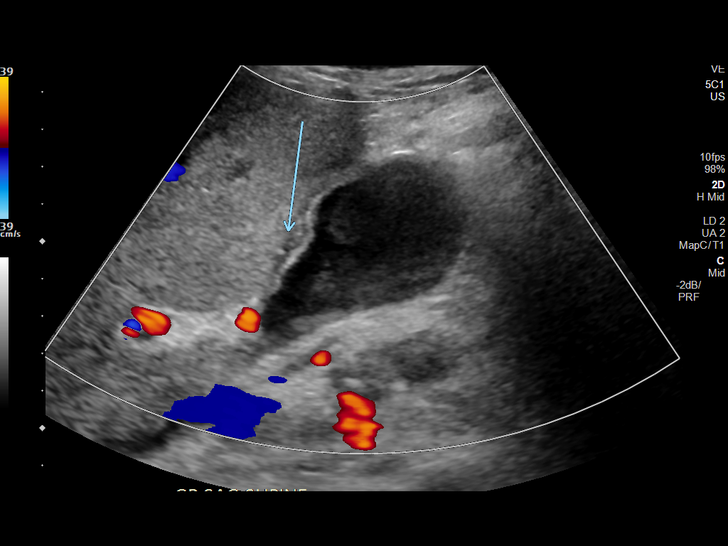
[im 10/58]
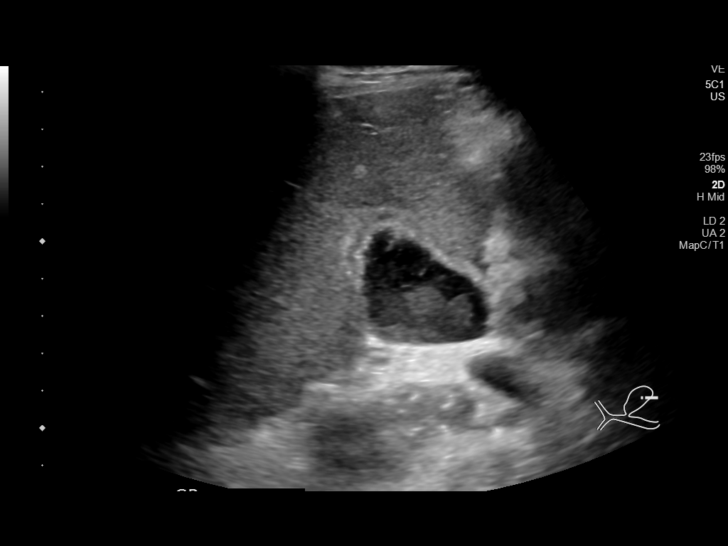
[im 15/58]
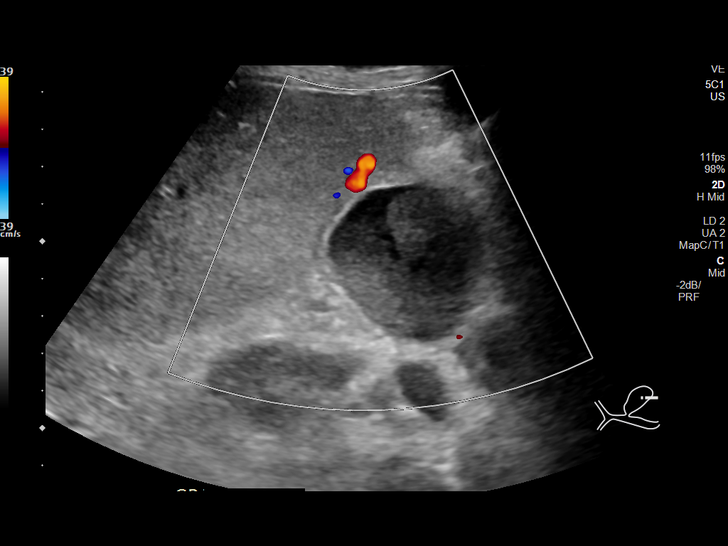
[im 20/58]
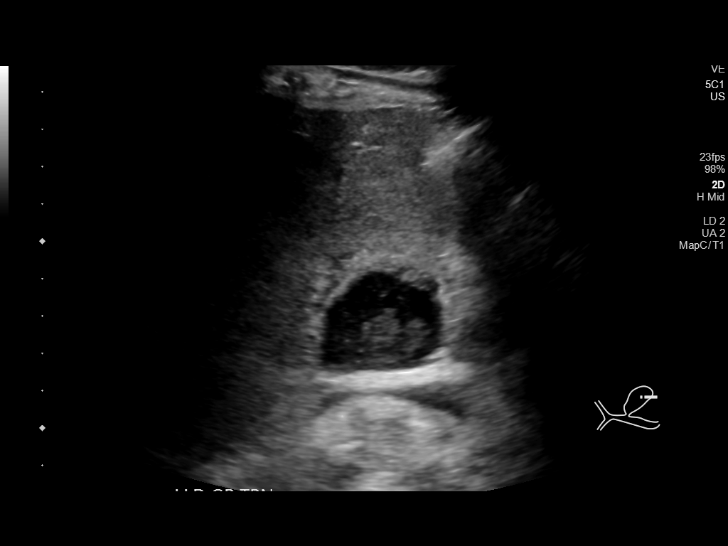
[im 22/58]
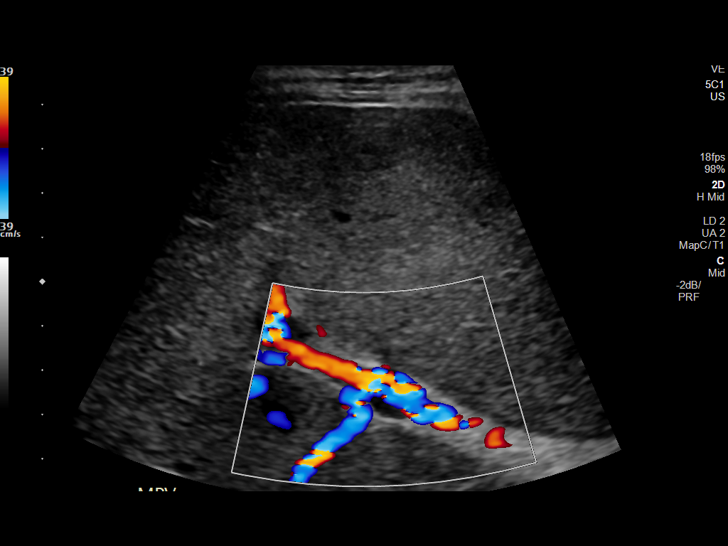
[im 27/58]
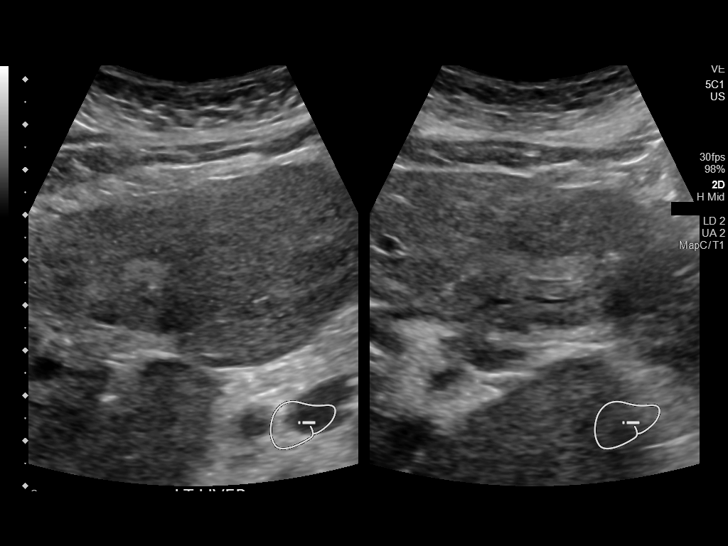
[im 31/58]
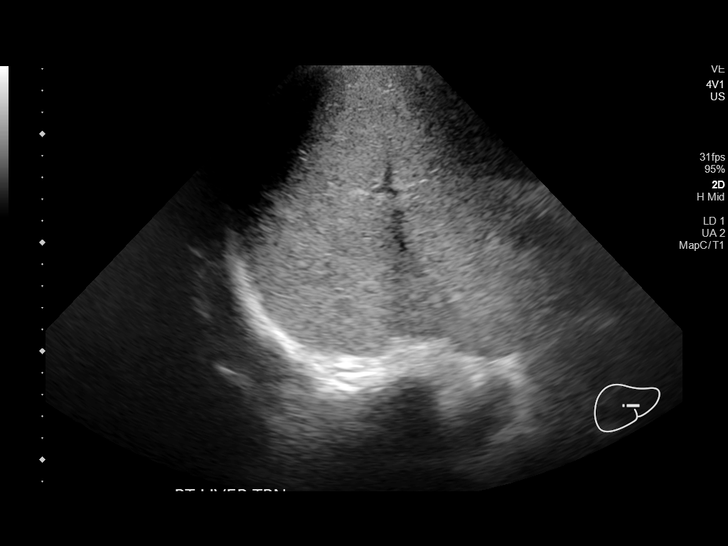
[im 36/58]
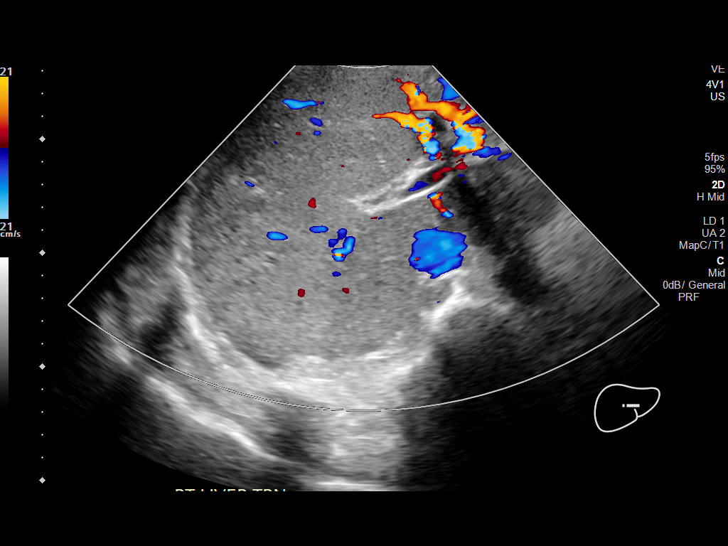
[im 39/58]
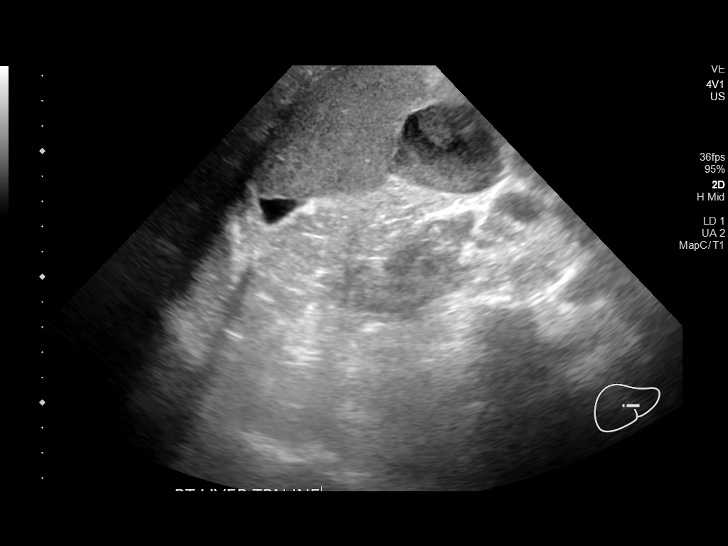
[im 43/58]
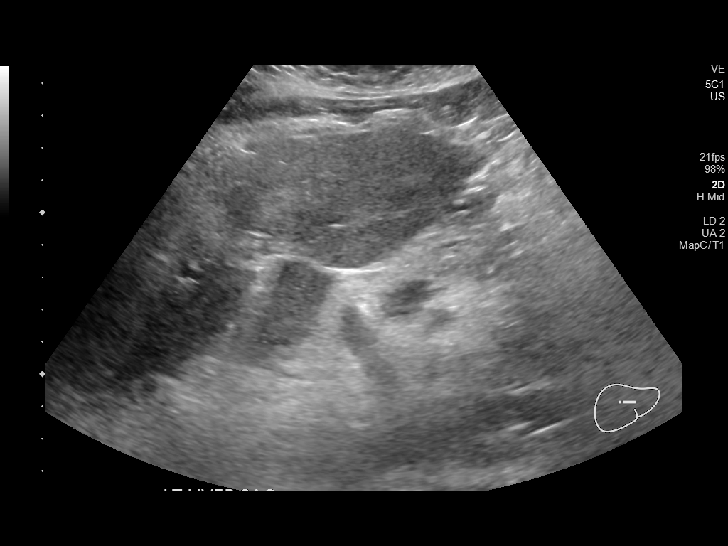
[im 48/58]
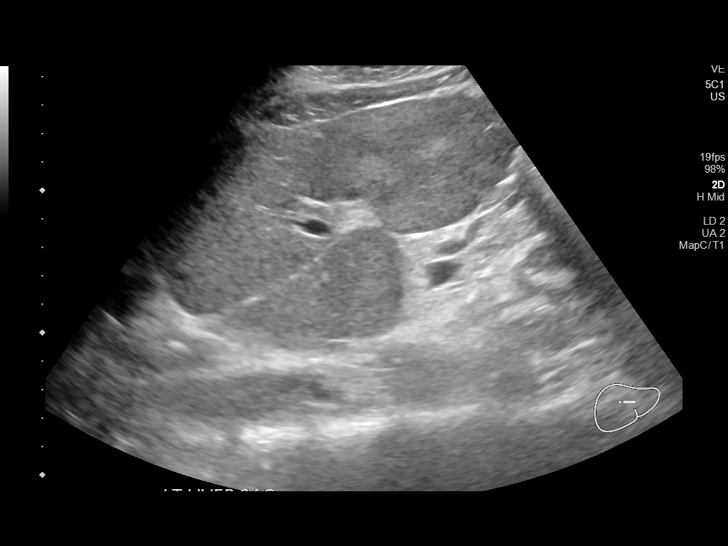
[im 53/58]
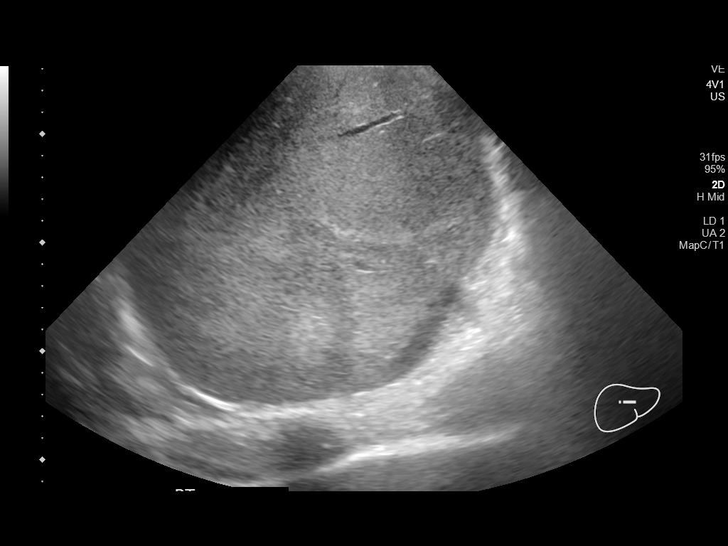
[im 58/58]
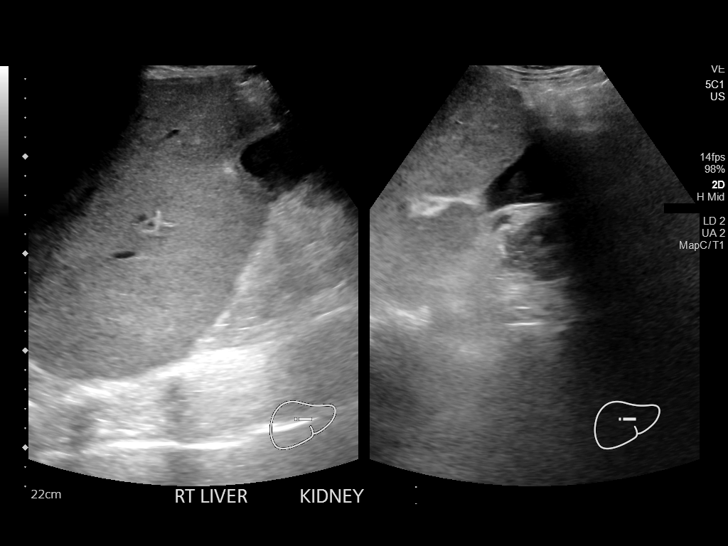

[14 of 25 positions shown; findings below may reference images not displayed]

FINDINGS: Gallbladder:

Tumefactive sludge is seen in the gallbladder. A small amount of
pericholecystic fluid is identified. No Murphy's sign is identified.
No wall thickening or stones.

Common bile duct:

Diameter: 4 mm

Liver:

Diffusely heterogeneous consistent with multiple known lesions. A
representative metastatic lesion measures up to 3.2 cm. Portal vein
is patent on color Doppler imaging with normal direction of blood
flow towards the liver.

Other: None.
IMPRESSION: 1. There is significant sludge in the gallbladder. There is now a
small amount of pericholecystic fluid. However, no Murphy's sign,
wall thickening, or stone identified. A HIDA scan could further
evaluate if the picture remains clinically ambiguous.
2. Known metastatic disease in the liver, better assessed on CT
imaging.

## 2020-07-13 IMAGING — DX DG CHEST 1V PORT
1 series · 1 of 1 positions shown · non-contrast
Comparison: [DATE]

CLINICAL DATA: Central line placement.

EXAM:
PORTABLE CHEST 1 VIEW

[chest ap]
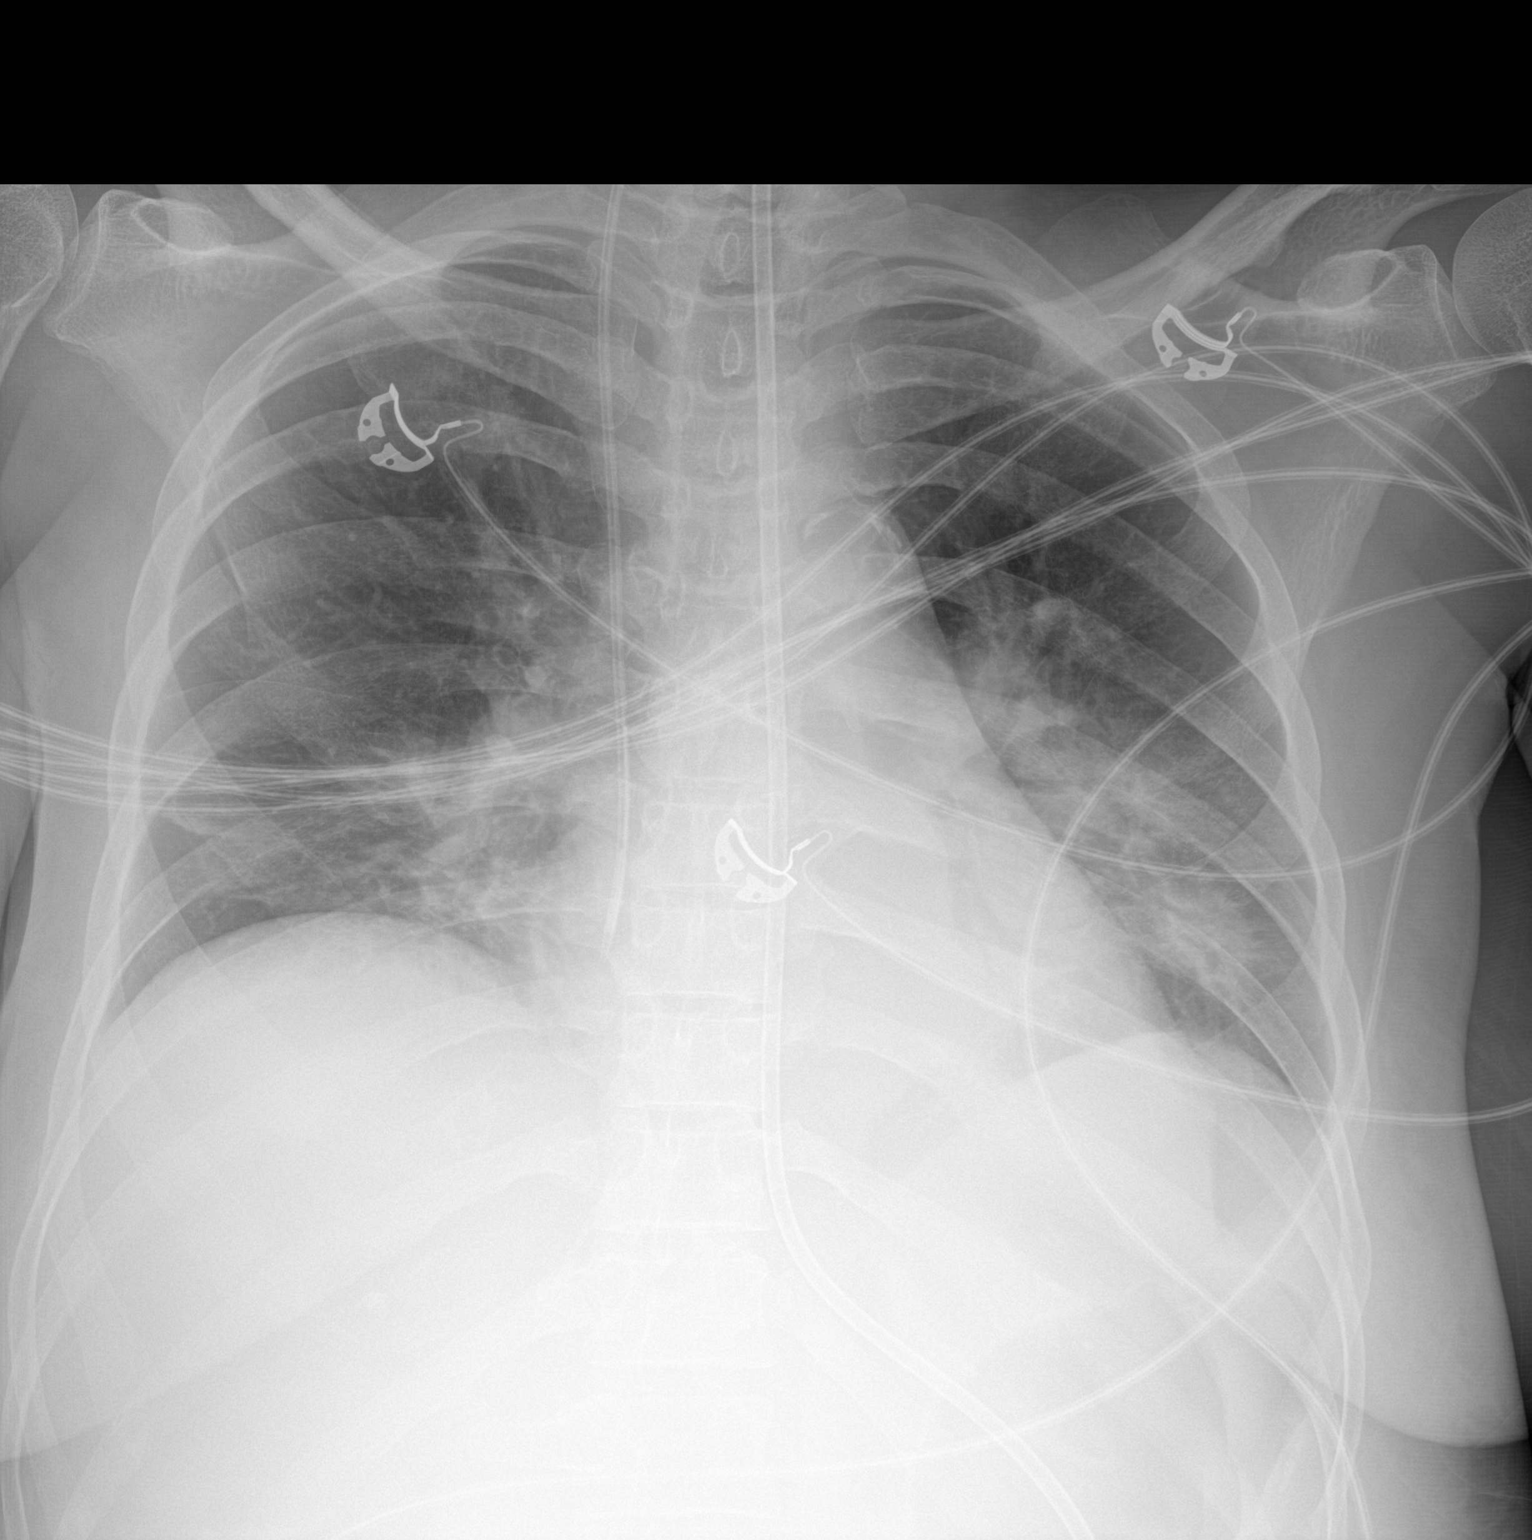

[1 of 1 positions shown; findings below may reference images not displayed]

FINDINGS: Enteric tube courses into the region of the stomach and off the film
as tip is not visualized. Interval placement of right IJ central
venous catheter which has tip over the right atrium and could be
pulled back 4-5 cm.

Lungs are somewhat hypoinflated demonstrate mild asymmetric
bilateral perihilar opacification left worse than right with slight
interval improvement as this may be due to interstitial edema versus
infection. No effusion. No pneumothorax. Cardiomediastinal
silhouette and remainder of the exam is unchanged.
IMPRESSION: 1. Slight interval improvement in bilateral perihilar opacification
left worse than right which may be due to interstitial edema versus
infection.
2. Right IJ central venous catheter with tip over the right atrium
and could be pulled back 4-5 cm. No pneumothorax.

## 2020-07-13 MED ORDER — SODIUM CHLORIDE 0.9 % IV SOLN
2.0000 g | Freq: Three times a day (TID) | INTRAVENOUS | Status: DC
  Administered 2020-07-13 – 2020-07-15 (×7): 2 g via INTRAVENOUS
  Filled 2020-07-13 (×10): qty 2

## 2020-07-13 MED ORDER — NOREPINEPHRINE 4 MG/250ML-% IV SOLN: 2.0000 ug/min | INTRAVENOUS | Status: DC

## 2020-07-13 MED ORDER — LORAZEPAM 2 MG/ML IJ SOLN: 0.5000 mg | Freq: Once | INTRAMUSCULAR | Status: AC

## 2020-07-13 MED ORDER — SODIUM CHLORIDE 0.9 % IV SOLN: 250.0000 mL | INTRAVENOUS | Status: DC

## 2020-07-13 MED ORDER — NOREPINEPHRINE 4 MG/250ML-% IV SOLN
0.0000 ug/min | INTRAVENOUS | Status: DC
  Administered 2020-07-13: 3 ug/min via INTRAVENOUS

## 2020-07-13 MED ORDER — NOREPINEPHRINE 4 MG/250ML-% IV SOLN
INTRAVENOUS | Status: AC
  Administered 2020-07-13: 2 ug/min via INTRAVENOUS
  Filled 2020-07-13: qty 250

## 2020-07-13 MED ORDER — SODIUM CHLORIDE 0.9 % IV BOLUS
1000.0000 mL | Freq: Once | INTRAVENOUS | Status: AC
  Administered 2020-07-13: 1000 mL via INTRAVENOUS

## 2020-07-13 MED ORDER — HYDROCORTISONE NA SUCCINATE PF 100 MG IJ SOLR
100.0000 mg | Freq: Three times a day (TID) | INTRAMUSCULAR | Status: DC
  Administered 2020-07-13 – 2020-07-15 (×6): 100 mg via INTRAVENOUS
  Filled 2020-07-13 (×6): qty 2

## 2020-07-13 MED ORDER — VANCOMYCIN HCL 1500 MG/300ML IV SOLN
1500.0000 mg | Freq: Once | INTRAVENOUS | Status: AC
  Administered 2020-07-13: 1500 mg via INTRAVENOUS
  Filled 2020-07-13: qty 300

## 2020-07-13 MED ORDER — LORAZEPAM 2 MG/ML IJ SOLN
INTRAMUSCULAR | Status: AC
  Administered 2020-07-13: 0.5 mg via INTRAVENOUS
  Filled 2020-07-13: qty 1

## 2020-07-13 MED ORDER — NOREPINEPHRINE 4 MG/250ML-% IV SOLN
0.0000 ug/min | INTRAVENOUS | Status: DC
Start: 2020-07-13 — End: 2020-07-13

## 2020-07-13 MED ORDER — PANTOPRAZOLE SODIUM 40 MG PO PACK
40.0000 mg | PACK | Freq: Two times a day (BID) | ORAL | Status: DC
  Administered 2020-07-14 – 2020-07-16 (×6): 40 mg
  Filled 2020-07-13 (×5): qty 20

## 2020-07-13 MED ORDER — VANCOMYCIN HCL 750 MG/150ML IV SOLN
750.0000 mg | Freq: Two times a day (BID) | INTRAVENOUS | Status: DC
  Administered 2020-07-13 – 2020-07-15 (×4): 750 mg via INTRAVENOUS
  Filled 2020-07-13 (×4): qty 150

## 2020-07-13 NOTE — Progress Notes (Addendum)
Pharmacy Antibiotic Note  Candace Griffin is a 44 y.o. female admitted on 06/26/2020 with sepsis.  Pharmacy has been consulted for vanc/meropenem dosing.  Plan: Vanc 1500mg  IV x 1 then 750mg  IV q12 - goal trough 15-20 Meropenem 2g IV q8 per current renal function  Height: 5\' 7"  (170.2 cm) Weight: 72.4 kg (159 lb 9.8 oz) IBW/kg (Calculated) : 61.6  Temp (24hrs), Avg:98.9 F (37.2 C), Min:98 F (36.7 C), Max:100.4 F (38 C)  Recent Labs  Lab 07/10/20 0541 07/10/20 1727 07/11/20 0411 07/12/20 0330 07/12/20 1204 07/13/20 0013  WBC 15.8* 15.9* 21.4* 29.3*  --  21.5*  CREATININE 1.20* 1.11* 1.16* 1.08*  --  1.28*  LATICACIDVEN  --   --   --   --  1.1  --     Estimated Creatinine Clearance: 55.1 mL/min (A) (by C-G formula based on SCr of 1.28 mg/dL (H)).    Allergies  Allergen Reactions  . Lisinopril Cough  . Tape Rash and Other (See Comments)    Causes welts also    Thank you for allowing pharmacy to be a part of this patient's care.  Kara Mead 07/13/2020 7:54 AM

## 2020-07-13 NOTE — Progress Notes (Signed)
RN followed up with blood bank upon patient's arrival to unit. Blood admin delayed d/t antibody presence and delivery of product coming from [place]. Dr. Alfredia Ferguson made aware of the delay in admin. Also made aware of BP readings starting to trend down. Per Dr. Alfredia Ferguson hold 1900 Lasix if no response from BP post transfusion.

## 2020-07-13 NOTE — Plan of Care (Signed)
  Problem: Clinical Measurements: Goal: Respiratory complications will improve Outcome: Progressing Goal: Cardiovascular complication will be avoided Outcome: Progressing   Problem: Nutrition: Goal: Adequate nutrition will be maintained Outcome: Progressing   Problem: Coping: Goal: Level of anxiety will decrease Outcome: Progressing   Problem: Elimination: Goal: Will not experience complications related to bowel motility Outcome: Progressing Goal: Will not experience complications related to urinary retention Outcome: Progressing   Problem: Safety: Goal: Ability to remain free from injury will improve Outcome: Progressing   Problem: Education: Goal: Knowledge of General Education information will improve Description: Including pain rating scale, medication(s)/side effects and non-pharmacologic comfort measures Outcome: Not Progressing Note: Patient remains confused.  Mom aware.   Problem: Health Behavior/Discharge Planning: Goal: Ability to manage health-related needs will improve Outcome: Not Progressing   Problem: Clinical Measurements: Goal: Ability to maintain clinical measurements within normal limits will improve Outcome: Not Progressing Goal: Will remain free from infection Outcome: Not Progressing Note: Increased WBC. Goal: Diagnostic test results will improve Outcome: Not Progressing   Problem: Activity: Goal: Risk for activity intolerance will decrease Outcome: Not Progressing Note: Remains very weak.   Problem: Pain Managment: Goal: General experience of comfort will improve Outcome: Not Progressing Note: Continues to complain of pain with movement.   Problem: Skin Integrity: Goal: Risk for impaired skin integrity will decrease Outcome: Not Progressing Note: Patient with not adequate intake and refusing to turn.

## 2020-07-13 NOTE — Progress Notes (Signed)
OT Cancellation Note  Patient Details Name: Candace Griffin MRN: [id number] DOB: [birth year]   Cancelled Treatment:    Reason Eval/Treat Not Completed: Medical issues which prohibited therapy. Patient transferred to ICU with plans to transfer to [facility]. Currently patient exhibits tachycardia and hypotension. Will hold and f/u when patient medically appropriate for therapy.  Otilia Kareem L [place] Bula 07/13/2020, 9:36 AM

## 2020-07-13 NOTE — Procedures (Signed)
Central Venous Catheter Insertion Procedure Note  Candace Griffin  [id number]  Dec 27, 1975  Date:07/13/20  Time:9:06 AM   Provider Performing:Areatha Kalata Mamie Nick Carlis Abbott   Procedure: Insertion of Non-tunneled Central Venous [phone]) with US guidance ([id number])   Indication(s) Medication administration  Consent Risks of the procedure as well as the alternatives and risks of each were explained to the patient and/or caregiver.  Consent for the procedure was obtained and is signed in the bedside chart  Anesthesia Topical only with 1% lidocaine   Timeout Verified patient identification, verified procedure, site/side was marked, verified correct patient position, special equipment/implants available, medications/allergies/relevant history reviewed, required imaging and test results available.  Sterile Technique Maximal sterile technique including full sterile barrier drape, hand hygiene, sterile gown, sterile gloves, mask, hair covering, sterile ultrasound probe cover (if used).  Procedure Description Area of catheter insertion was cleaned with chlorhexidine and draped in sterile fashion.  With real-time ultrasound guidance a central venous catheter was placed into the right internal jugular vein. [place] used to confirm guidewire placement in the vein. Nonpulsatile blood flow and easy flushing noted in all ports.  The catheter was sutured in place and sterile dressing applied.  Complications/Tolerance None; patient tolerated the procedure well. Chest X-ray is ordered to verify placement for internal jugular or subclavian cannulation.   Chest x-ray is not ordered for femoral cannulation.  EBL Minimal  Specimen(s) None  Julian Hy, DO 07/13/20 9:08 AM [facility]

## 2020-07-13 NOTE — Consult Note (Signed)
NAME:  Candace Griffin, MRN:  [id number], DOB:  11/23/1975, LOS: 65 ADMISSION DATE:  06/26/2020, CONSULTATION DATE:  07/13/20 REFERRING MD:  Alfredia Ferguson, CHIEF COMPLAINT:  shock   Brief History   Shock, presumed septic. Newly diagnosed metastatic RCC receiving chemo. ESRD s/p renal transplant 2 years ago on MMF, tacrolimus, prednisone.  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  She was recently diagnosed with metastatic (bone, liver) renal cell carcinoma of unknown primary.  She was admitted on 11/3 with abdominal pain and intractable nausea and vomiting.  She has had persistent difficulty tolerating p.o. intake, requiring postpyloric enteral feedings.  She has developed encephalopathy of unknown etiology.  Neurology has been consulted and has been providing supplemental thiamine.  ID was consulted, but no source of infection has been identified.  Antibiotics stopped on 11/19.  She has had an AKI throughout the admission with peak creatinine 1.42.  This is improved with volume resuscitation.  Overnight she developed hypotension, partially responsive to fluids.  She has remained on her immunosuppressive medications throughout her admission at the direction of her transplant physicians at [facility].  She was accepted for transfer to [facility] on 11/19, but beds have so far not been available.  Past Medical History  ESRD Renal transplant x 2, most recent 2019 at [place] to ICU 11/20 for shock  Consults:  PCCM  Procedures:  RIJ CVC 11/20   Significant Diagnostic Tests:  MRI brain 11/19-unremarkable MRI spine- bone mets to T12, L1, L2 vertebral bodies, right ischial tuberosity. Bulky retroperitoneal, iliac, inguinal adenopathy.   Echo 07/06/20> LVEF greater than 75%, hyperdynamic function.   Indeterminate diastolic function.  Normal RV.  Mild TR, otherwise normal valves.  Micro Data:  11/3 blood-no growth 11/17 urine-no growth 11/18 blood>> 11/20 blood>> 11/20 urine>>  Antimicrobials:  Cefepime 11/3 Flagyl 11/3 Zosyn 11/5-11/8 Azithromycin 11/17-11/18 Ceftriaxone 11/17-11/18 vanc 11/3, 11/20> Meropenem 11/20>  Interim history/subjective:    Objective   Blood pressure (!) 76/56, pulse (!) 122, temperature 98.8 F (37.1 C), temperature source Oral, resp. rate (!) 28, height 5\' 7"  (1.702 m), weight 72.4 kg, SpO2 92 %.        Intake/Output Summary (Last 24 hours) at 07/13/2020 0743 Last data filed at 07/13/2020 0700 Gross per 24 hour  Intake 1224.99 ml  Output 350 ml  Net 874.99 ml   Filed Weights   06/26/20 1815 07/12/20 1630 07/13/20 0500  Weight: 57.3 kg 72.1 kg 72.4 kg    Examination: General: Fatigued, chronically ill-appearing woman lying in bed no acute distress HENT: [place]/AT, eyes anicteric Lungs: Tachypneic, no accessory muscle use.  Clear to auscultation anteriorly, reduced posteriorly.  No observed coughing.  Breathing comfortably on room air. Cardiovascular: Tachycardic, regular rhythm Abdomen: Soft, nontender to palpation but tender to percussion diffusely. Extremities: Pitting edema below the knees bilaterally.  Fistula with palpable thrill right upper extremity Neuro: Sleepy but easily arousable, slurred speech.  Oriented to person and place. GU: Pure wick Derm: No rashes or ecchymoses  Resolved Hospital Problem list     Assessment & Plan:  Shock-presumed septic and in immunocompromised patient.  Previously normal EF on echocardiogram.  No reason to suspect acute coronary syndrome.  PE less likely given lack of hypoxia. Possibly exacerbated by adrenal insufficiency with chronic low-dose prednisone use for immunosuppression. -  Repeat blood cultures, urine culture -Repeat right upper quadrant ultrasound given concern for gallbladder sludge on  CT -Restart empiric broad-spectrum antibiotics-meropenem and vancomycin -Vasopressors as required to maintain MAP greater than 65.  CVC placed to facilitate antibiotic and vasopressor administration. -Stress dose steroids -Holding mycophenolate and tacrolimus  Acute encephalopathy-likely related to multiple antiemetics that have been required during this admission.  Brain MRI normal. -Continue n.p.o. status -Continue to monitor -Agree with thiamine repletion given limited enteral intake recently. -Appreciate neurology's assistance.  Malnutrition-moderate -Enteral tube feeds; advancing as tolerated -Supplemental thiamine  Intractable nausea and vomiting -Once able to tolerate goal tube feeds will de-escalate antiemetic regimen. -Continue Protonix -Continue postpyloric feeding  Tachycardia-likely reactive -Discontinue metoprolol  AKI History of end-stage renal disease due to chronic glomerulonephritis, status post renal transplant x2 -Maintain adequate renal perfusion -Strict I/O -Renally dose meds and avoid nephrotoxic meds as much as possible -Holding immunosuppression for now due to acute illness; stress dose steroids  Hyperbilirubinemia; possibly GB sludge on CT -Right upper quadrant ultrasound   [facility] transfer center updated on upgrade to ICU status. Still planning on transfer when a bed is available.  Best practice:  Diet: N.p.o. Pain/Anxiety/Delirium protocol (if indicated): Oxycodone for pain VAP protocol (if indicated): n/a DVT prophylaxis: Lovenox GI prophylaxis: Pantoprazole Glucose control: n/a Mobility: progressive Code Status: Full Family Communication: Mother updated 11/20 Disposition: ICU  Labs   CBC: Recent Labs  Lab 07/10/20 0541 07/10/20 1727 07/11/20 0411 07/12/20 0330 07/13/20 0013  WBC 15.8* 15.9* 21.4* 29.3* 21.5*  NEUTROABS  --  13.0* 18.4* 26.9* 19.1*  HGB 8.1* 7.7* 8.0* 7.1* 8.6*  HCT 25.4* 23.3* 24.2* 21.6* 26.9*  MCV  84.4 84.4 83.4 83.4 84.3  PLT 138* 130* 146* 148* 133*    Basic Metabolic Panel: Recent Labs  Lab 07/09/20 0350 07/10/20 0541 07/10/20 1727 07/11/20 0411 07/12/20 0013 07/12/20 0330 07/13/20 0013  NA  --  135 135 137  --  137 140  K  --  4.3 3.7 4.2  --  4.8 4.5  CL  --  106 106 105  --  105 109  CO2  --  21* 18* 20*  --  22 20*  GLUCOSE  --  148* 145* 109*  --  146* 111*  BUN  --  12 15 15   --  21* 24*  CREATININE  --  1.20* 1.11* 1.16*  --  1.08* 1.28*  CALCIUM  --  8.1* 7.7* 7.8*  --  7.4* 7.6*  MG   < >  --  1.3* 2.6* 1.9 2.1 1.9  PHOS  --   --  2.3* 2.8 2.5 3.1 2.5   < > = values in this interval not displayed.   GFR: Estimated Creatinine Clearance: 55.1 mL/min (A) (by C-G formula based on SCr of 1.28 mg/dL (H)). Recent Labs  Lab 07/10/20 1727 07/11/20 0411 07/12/20 0330 07/12/20 1204 07/13/20 0013  PROCALCITON  --   --   --  8.78 9.30  WBC 15.9* 21.4* 29.3*  --  21.5*  LATICACIDVEN  --   --   --  1.1  --     Liver Function Tests: Recent Labs  Lab 07/09/20 0350 07/10/20 1727 07/11/20 0411 07/12/20 0330 07/13/20 0013  AST 50* 50* 53* 55* 70*  ALT 38 31 32 29 30  ALKPHOS 205* 208* 214* 214* 298*  BILITOT 4.0* 4.9* 5.2* 5.0* 4.6*  PROT 5.4* 5.1* 5.5* 5.2* 5.1*  ALBUMIN 1.8* 1.7* 1.9* 1.7* 1.8*  No results for input(s): LIPASE, AMYLASE in the last 168 hours. Recent Labs  Lab 07/10/20 1727  AMMONIA 24    ABG No results found for: PHART, PCO2ART, PO2ART, HCO3, TCO2, ACIDBASEDEF, O2SAT   Coagulation Profile: No results for input(s): INR, PROTIME in the last 168 hours.  Cardiac Enzymes: Recent Labs  Lab 07/10/20 0541  CKTOTAL 14*    HbA1C: No results found for: HGBA1C  CBG: Recent Labs  Lab 07/12/20 1932 07/12/20 2314 07/13/20 0311  GLUCAP 93 86 117*    Review of Systems:   Limited due to patient's encephalopathy.  Significant for anxiety, back pain, confusion.  Past Medical History  She,  has a past medical history of Chronic  glomerulonephritis, Dialysis patient [facility]), End stage renal disease ([place]), GERD (gastroesophageal reflux disease), History of renal transplant (2011-10-25), and Hypertension.   Surgical History    Past Surgical History:  Procedure Laterality Date  . COLPOSCOPY  2016  . diaylsis shunt Right    arm  . KIDNEY TRANSPLANT  10-25-11   deceased donor kidney     Social History   reports that she has never smoked. She has never used smokeless tobacco. She reports that she does not drink alcohol and does not use drugs.   Family History   Her family history includes Alcohol abuse in her paternal grandfather, paternal grandmother, and paternal uncle; Breast cancer in her maternal grandmother; Cancer in her maternal grandmother; Diabetes in her father, paternal aunt, and paternal grandmother.   Allergies Allergies  Allergen Reactions  . Lisinopril Cough  . Tape Rash and Other (See Comments)    Causes welts also     Home Medications  Prior to Admission medications   Medication Sig Start Date End Date Taking? Authorizing Provider  dexamethasone (DECADRON) 4 MG tablet Take 2 tablets (8 mg total) by mouth daily. Start the day after carboplatin chemotherapy for 3 days. 06/10/20  Yes Brunetta Genera, MD  LORazepam (ATIVAN) 0.5 MG tablet Take 1 tablet (0.5 mg total) by mouth every 8 (eight) hours as needed for anxiety. 06/03/20  Yes Maness, Arnette Norris, MD  mycophenolate (CELLCEPT) 250 MG capsule Take 500 mg by mouth 2 (two) times daily.  07/19/18  Yes [provider]  omeprazole (PRILOSEC) 20 MG capsule Take 20 mg by mouth 2 (two) times daily as needed (for heart burn).    Yes [provider]  ondansetron (ZOFRAN) 8 MG tablet Take 1 tablet (8 mg total) by mouth every 8 (eight) hours as needed for nausea or vomiting. 06/03/20  Yes Maness, Arnette Norris, MD  oxyCODONE (ROXICODONE) 5 MG immediate release tablet Take 2 tablets (10 mg total) by mouth every 4 (four) hours as needed for moderate pain  or severe pain. 06/03/20  Yes Maness, Arnette Norris, MD  predniSONE (DELTASONE) 5 MG tablet Take 5 mg by mouth daily with breakfast.  07/25/19  Yes [provider]  Tacrolimus ER (ENVARSUS XR) 1 MG TB24 Take 4 mg by mouth daily.  12/01/19  Yes [provider]  cabozantinib (CABOMETYX) 20 MG tablet Take 1 tablet (20 mg total) by mouth daily. Take on an empty stomach, 1 hour before or 2 hours after meals. 06/27/20   Brunetta Genera, MD  fluconazole (DIFLUCAN) 100 MG tablet Take 1 tablet (100 mg total) by mouth daily. Patient not taking: Reported on 06/26/2020 06/02/20   Benay Pike, MD  lidocaine-prilocaine (EMLA) cream Apply to affected area once Patient not taking: Reported on 06/26/2020 06/10/20   Irene Limbo,  Cloria Spring, MD  LORazepam (ATIVAN) 0.5 MG tablet Take 1 tablet (0.5 mg total) by mouth every 6 (six) hours as needed (Nausea or vomiting). Patient not taking: Reported on 06/13/2020 06/10/20   Brunetta Genera, MD  ondansetron (ZOFRAN) 8 MG tablet Take 1 tablet (8 mg total) by mouth 2 (two) times daily as needed for refractory nausea / vomiting. Start on day 3 after carboplatin chemo. Patient not taking: Reported on 06/26/2020 06/10/20   Brunetta Genera, MD  pravastatin (PRAVACHOL) 20 MG tablet Take 20 mg by mouth at bedtime.  05/29/19   [provider]  prochlorperazine (COMPAZINE) 10 MG tablet Take 1 tablet (10 mg total) by mouth every 6 (six) hours as needed (Nausea or vomiting). Patient not taking: Reported on 06/26/2020 06/10/20   Brunetta Genera, MD  senna (SENOKOT) 8.6 MG TABS tablet Take 1 tablet (8.6 mg total) by mouth daily as needed for moderate constipation. Patient not taking: Reported on 06/26/2020 02/19/20   Richarda Osmond, DO     Critical care time: 60 minutes      Julian Hy, DO 07/13/20 10:03 AM [facility]

## 2020-07-13 NOTE — Progress Notes (Signed)
    [place] for Infectious Disease   Reason for visit: Follow up on leukocytosis  Interval History: became more hypotensive and concern for shock.  Now in the care of CCM.   Physical Exam: Constitutional:  Vitals:   07/13/20 1345 07/13/20 1400  BP: (!) 127/55 133/87  Pulse: (!) 112 (!) 112  Resp: (!) 30 (!) 24  Temp:    SpO2: 96% 94%   patient appears in NAD; getting echo done now.   Impression: shock with hypotension and now on pressor support. Antibiotics added with concern for septic origin of shock.  Lactate wnl.  WBC improved.  Remains afebrile.   Plan: 1.  Continue with antibiotics and supportive care.  Being transferred to [facility] when bed available.

## 2020-07-13 NOTE — Progress Notes (Addendum)
Patient was accepted to [facility] and is to be discharged yesterday, however still no bed is available yet.  Overnight she became more hypotensive and this morning critical care was consulted and they have taken over the care of the patient and have initiated pressors.  A central line was placed.  Patient is still critically ill and [facility] was notified that she will likely need an ICU bed instead of the stepdown bed.  [place] called back and spoke with Dr. Carlis Abbott directly.  Patient continues to be encephalopathic and likely was in shock this morning and it is presumed to be septic given her elevated procalcitonin, elevated white blood cell count and multifocal pneumonia noted.  Critical care has taken over the case and if she not transferred to [facility] at the time that she is stable to be transferred back to [facility] service,  we will see the patient then.

## 2020-07-14 DIAGNOSIS — N179 Acute kidney failure, unspecified: Secondary | ICD-10-CM | POA: Diagnosis not present

## 2020-07-14 DIAGNOSIS — A419 Sepsis, unspecified organism: Secondary | ICD-10-CM

## 2020-07-14 DIAGNOSIS — E440 Moderate protein-calorie malnutrition: Secondary | ICD-10-CM | POA: Diagnosis not present

## 2020-07-14 DIAGNOSIS — R6521 Severe sepsis with septic shock: Secondary | ICD-10-CM

## 2020-07-14 DIAGNOSIS — C787 Secondary malignant neoplasm of liver and intrahepatic bile duct: Secondary | ICD-10-CM | POA: Diagnosis not present

## 2020-07-14 DIAGNOSIS — G9340 Encephalopathy, unspecified: Secondary | ICD-10-CM

## 2020-07-14 LAB — PROCALCITONIN: Procalcitonin: 7.6 ng/mL

## 2020-07-14 LAB — GLUCOSE, CAPILLARY
Glucose-Capillary: 176 mg/dL — ABNORMAL HIGH (ref 70–99)
Glucose-Capillary: 221 mg/dL — ABNORMAL HIGH (ref 70–99)
Glucose-Capillary: 218 mg/dL — ABNORMAL HIGH (ref 70–99)
Glucose-Capillary: 199 mg/dL — ABNORMAL HIGH (ref 70–99)
Glucose-Capillary: 241 mg/dL — ABNORMAL HIGH (ref 70–99)
Glucose-Capillary: 162 mg/dL — ABNORMAL HIGH (ref 70–99)

## 2020-07-14 LAB — MAGNESIUM
Magnesium: 1.8 mg/dL (ref 1.7–2.4)
Magnesium: 1.9 mg/dL (ref 1.7–2.4)

## 2020-07-14 LAB — BASIC METABOLIC PANEL
Anion gap: 11 (ref 5–15)
BUN: 34 mg/dL — ABNORMAL HIGH (ref 6–20)
CO2: 18 mmol/L — ABNORMAL LOW (ref 22–32)
Calcium: 7.7 mg/dL — ABNORMAL LOW (ref 8.9–10.3)
Chloride: 112 mmol/L — ABNORMAL HIGH (ref 98–111)
Creatinine, Ser: 1.33 mg/dL — ABNORMAL HIGH (ref 0.44–1.00)
GFR, Estimated: 51 mL/min — ABNORMAL LOW (ref >60)
Glucose, Bld: 270 mg/dL — ABNORMAL HIGH (ref 70–99)
Potassium: 3.8 mmol/L (ref 3.5–5.1)
Sodium: 141 mmol/L (ref 135–145)

## 2020-07-14 LAB — CBC
HCT: 25.2 % — ABNORMAL LOW (ref 36.0–46.0)
Hemoglobin: 8.2 g/dL — ABNORMAL LOW (ref 12.0–15.0)
MCH: 27 pg (ref 26.0–34.0)
MCHC: 32.5 g/dL (ref 30.0–36.0)
MCV: 82.9 fL (ref 80.0–100.0)
Platelets: 157 10*3/uL (ref 150–400)
RBC: 3.04 MIL/uL — ABNORMAL LOW (ref 3.87–5.11)
RDW: 23.8 % — ABNORMAL HIGH (ref 11.5–15.5)
WBC: 21.3 10*3/uL — ABNORMAL HIGH (ref 4.0–10.5)
nRBC: 0.1 % (ref 0.0–0.2)

## 2020-07-14 LAB — PHOSPHORUS
Phosphorus: 2.7 mg/dL (ref 2.5–4.6)
Phosphorus: 2.7 mg/dL (ref 2.5–4.6)

## 2020-07-14 LAB — HEMOGLOBIN A1C
Hgb A1c MFr Bld: 6 % — ABNORMAL HIGH (ref 4.8–5.6)
Mean Plasma Glucose: 125.5 mg/dL

## 2020-07-14 MED ORDER — INSULIN ASPART 100 UNIT/ML ~~LOC~~ SOLN
0.0000 [IU] | SUBCUTANEOUS | Status: DC
  Administered 2020-07-14: 5 [IU] via SUBCUTANEOUS
  Administered 2020-07-14: 3 [IU] via SUBCUTANEOUS
  Administered 2020-07-14: 5 [IU] via SUBCUTANEOUS

## 2020-07-14 MED ORDER — SODIUM CHLORIDE 0.9% FLUSH
10.0000 mL | INTRAVENOUS | Status: DC | PRN
  Administered 2020-07-16: 10 mL

## 2020-07-14 MED ORDER — INSULIN ASPART 100 UNIT/ML ~~LOC~~ SOLN
3.0000 [IU] | SUBCUTANEOUS | Status: DC
  Administered 2020-07-14: 9 [IU] via SUBCUTANEOUS
  Administered 2020-07-15: 6 [IU] via SUBCUTANEOUS
  Administered 2020-07-15: 3 [IU] via SUBCUTANEOUS
  Administered 2020-07-15 (×2): 6 [IU] via SUBCUTANEOUS
  Administered 2020-07-15 – 2020-07-16 (×6): 3 [IU] via SUBCUTANEOUS

## 2020-07-14 MED ORDER — PROMETHAZINE HCL 25 MG/ML IJ SOLN: 6.2500 mg | Freq: Two times a day (BID) | INTRAMUSCULAR | Status: DC | PRN

## 2020-07-14 MED ORDER — SODIUM CHLORIDE 0.9% FLUSH
10.0000 mL | Freq: Two times a day (BID) | INTRAVENOUS | Status: DC
  Administered 2020-07-14 (×2): 10 mL
  Administered 2020-07-15: 40 mL
  Administered 2020-07-15 – 2020-07-16 (×2): 10 mL

## 2020-07-14 MED ORDER — OXYCODONE HCL 5 MG/5ML PO SOLN
5.0000 mg | Freq: Four times a day (QID) | ORAL | Status: DC
  Administered 2020-07-14 – 2020-07-16 (×6): 5 mg
  Filled 2020-07-14 (×6): qty 5

## 2020-07-14 NOTE — Progress Notes (Signed)
[facility] Progress Note Patient Name: LASHELLE KOY DOB: 11-12-1975 MRN: [id number]   Date of Service  07/14/2020  HPI/Events of Note  Hyperglycemia - Blood glucose = 176. Patient is on Hydrocortisone IV.  eICU Interventions  Plan: 1. Q 4 hour moderate Novolog SSI.      Intervention Category Major Interventions: Hyperglycemia - active titration of insulin therapy  Lysle Dingwall 07/14/2020, 6:17 AM

## 2020-07-14 NOTE — Progress Notes (Signed)
Palliative care brief note  Reason for consult: Goals of care  Palliative care consult received.  Chart reviewed including personal review of pertinent labs and imaging.  Briefly, Candace Griffin is a 44 year old female with history of end-stage renal disease status post transplant at [facility] in 2019.  She has recent diagnosis of metastatic renal cell carcinoma of unknown primary.  She is admitted and has had a complicated hospital course including encephalopathy with unknown etiology.  She has also had AKI and hypotension.  Staff has been working in conjunction with her transplant physicians at [facility] and she was accepted for transfer to [facility] but bed has not yet been available.  I met briefly today with Candace Griffin.  She was awake and alert at time of my encounter.  We talked briefly and she expressed being tired and that she did not really feel up to talking to our team at this time.  Since initial consult was placed, she has been accepted for transfer to [facility] when a bed is available.  -At this point, goal is to transition to [facility] for further evaluation by her transplant team.  She was worn out and not up to talking with me today.  As goal to be transferred and evaluated by her transplant team at [facility] is clear, will hold off on formal consult regarding goals of care at this point.  If it appears that transfer to [place] is not going to be possible, we would certainly be happy to further engage in the care of Candace Griffin. -Palliative will continue to shadow chart.  Please call if there are specific needs with which we can be of assistance in the care of Candace Griffin at this point.  Micheline Rough, MD [place] [facility] Team [phone]  NO CHARGE NOTE

## 2020-07-14 NOTE — Progress Notes (Signed)
NAME:  Candace Griffin, MRN:  [id number], DOB:  [birth year], LOS: 31 ADMISSION DATE:  06/26/2020, CONSULTATION DATE:  07/13/20 REFERRING MD:  Alfredia Ferguson, CHIEF COMPLAINT:  shock   Brief History   Shock, presumed septic. Newly diagnosed metastatic RCC receiving chemo. ESRD s/p renal transplant 2 years ago on MMF, tacrolimus, prednisone.  History of present illness   Mrs. Candace Griffin is a 44 year old woman with a history of end-stage renal disease due to glomerulonephritis, status post 2 renal transplants, most recently at [facility] in 2019.  She was recently diagnosed with metastatic (bone, liver) renal cell carcinoma of unknown primary.  She was admitted on 11/3 with abdominal pain and intractable nausea and vomiting.  She has had persistent difficulty tolerating p.o. intake, requiring postpyloric enteral feedings.  She has developed encephalopathy of unknown etiology.  Neurology has been consulted and has been providing supplemental thiamine.  ID was consulted, but no source of infection has been identified.  Antibiotics stopped on 11/19.  She has had an AKI throughout the admission with peak creatinine 1.42.  This is improved with volume resuscitation.  Overnight she developed hypotension, partially responsive to fluids.  She has remained on her immunosuppressive medications throughout her admission at the direction of her transplant physicians at [facility].  She was accepted for transfer to [facility] on 11/19, but beds have so far not been available.  Past Medical History  ESRD Renal transplant x 2, most recent 2019 at [place] to ICU 11/20 for shock  Consults:  PCCM  Procedures:  RIJ CVC 11/20   Significant Diagnostic Tests:  MRI brain 11/19-unremarkable MRI spine- bone mets to T12, L1, L2 vertebral bodies, right ischial tuberosity. Bulky retroperitoneal, iliac, inguinal adenopathy.   Echo 07/06/20> LVEF greater than 75%, hyperdynamic function.   Indeterminate diastolic function.  Normal RV.  Mild TR, otherwise normal valves.  Micro Data:  11/3 blood-no growth 11/17 urine-no growth 11/18 blood>> 11/20 blood>> 11/20 urine>>  Antimicrobials:  Cefepime 11/3 Flagyl 11/3 Zosyn 11/5-11/8 Azithromycin 11/17-11/18 Ceftriaxone 11/17-11/18 vanc 11/3, 11/20> Meropenem 11/20>  Interim history/subjective:  [name] has been tolerating TF at goal without nausea.  Was able to tolerate some ice chips and sips of water today.  More alert this morning. Off vasopressors since early this morning.  Objective   Blood pressure (!) 139/104, pulse (!) 116, temperature 97.7 F (36.5 C), temperature source Oral, resp. rate (!) 24, height 5\' 7"  (1.702 m), weight 72.4 kg, SpO2 97 %.        Intake/Output Summary (Last 24 hours) at 07/14/2020 0741 Last data filed at 07/14/2020 0400 Gross per 24 hour  Intake 1903.6 ml  Output 120 ml  Net 1783.6 ml   Filed Weights   06/26/20 1815 07/12/20 1630 07/13/20 0500  Weight: 57.3 kg 72.1 kg 72.4 kg    Examination: General: Ill-appearing middle-aged woman sitting up in bed no acute distress, frail-appearing HENT: [place]/AT, eyes anicteric Lungs: Tachypneic, no accessory muscle use.  Clear to auscultation bilaterally.  Breathing comfortably on room air. Cardiovascular: Tachycardic, regular rhythm Abdomen: Soft, mildly distended.  Nontender to palpation. Extremities: Pitting edema to the knees, no clubbing or cyanosis Neuro: Alert, tracking, answering questions appropriately. GU: Pure wick Derm: No rashes  Resolved Hospital Problem list     Assessment & Plan:  Shock-presumed septic and in immunocompromised patient.  Previously normal EF on echocardiogram.  No reason to suspect acute coronary syndrome.  PE less likely given lack of hypoxia.  Possibly exacerbated by adrenal insufficiency with chronic low-dose prednisone use for immunosuppression. -Con't to follow cultures; unable to obtain repeat  cultures on 11/20 -Right upper quadrant ultrasound reviewed-likely will require HIDA scan tomorrow with intermittent abdominal pain. -Continue empiric antibiotics-meropenem and vancomycin -No longer requiring vasopressors -continue for now stress dose steroids -Holding mycophenolate and tacrolimus for now  Acute encephalopathy-likely related to multiple antiemetics that have been required during this admission.  Brain MRI normal. -Okay for cautiously restarting clear liquids as long as her mental status is fine -Continue to monitor -Agree with thiamine repletion given limited enteral intake recently. -Appreciate neurology's assistance.  Malnutrition-moderate -Enteral tube feeds; at goal -Supplemental thiamine  Intractable nausea and vomiting -Once able to tolerate goal tube feeds will de-escalate antiemetic regimen>> Phenergan now twice daily as needed. -Continue Protonix -Continue postpyloric feeding  Tachycardia-likely reactive -Continue to hold metoprolol for now  AKI History of end-stage renal disease due to chronic glomerulonephritis, status post renal transplant x2 -Continue to maintain adequate renal perfusion -Strict I/os -Renally dose meds and avoid nephrotoxic meds -Holding immunosuppression for now; on stress dose steroids which should prevent graft failure  Hyperbilirubinemia; possibly GB sludge on CT -Right upper quadrant ultrasound reviewed; likely needs HIDA scan tomorrow   [place] transfer center updated on ICU status. Still planning on transfer when a bed is available.  Best practice:  Diet: N.p.o. Pain/Anxiety/Delirium protocol (if indicated): Oxycodone for pain VAP protocol (if indicated): n/a DVT prophylaxis: Lovenox GI prophylaxis: Pantoprazole Glucose control: n/a Mobility: progressive Code Status: Full Family Communication: Mother updated 11/21 at bedside Disposition: ICU  Labs   CBC: Recent Labs  Lab 07/10/20 0541 07/10/20 1727  07/11/20 0411 07/12/20 0330 07/13/20 0013  WBC 15.8* 15.9* 21.4* 29.3* 21.5*  NEUTROABS  --  13.0* 18.4* 26.9* 19.1*  HGB 8.1* 7.7* 8.0* 7.1* 8.6*  HCT 25.4* 23.3* 24.2* 21.6* 26.9*  MCV 84.4 84.4 83.4 83.4 84.3  PLT 138* 130* 146* 148* 133*    Basic Metabolic Panel: Recent Labs  Lab 07/09/20 0350 07/10/20 0541 07/10/20 1727 07/10/20 1727 07/11/20 0411 07/11/20 0411 07/12/20 0013 07/12/20 0330 07/13/20 0013 07/13/20 2356 07/14/20 0458  NA  --  135 135  --  137  --   --  137 140  --   --   K  --  4.3 3.7  --  4.2  --   --  4.8 4.5  --   --   CL  --  106 106  --  105  --   --  105 109  --   --   CO2  --  21* 18*  --  20*  --   --  22 20*  --   --   GLUCOSE  --  148* 145*  --  109*  --   --  146* 111*  --   --   BUN  --  12 15  --  15  --   --  21* 24*  --   --   CREATININE  --  1.20* 1.11*  --  1.16*  --   --  1.08* 1.28*  --   --   CALCIUM  --  8.1* 7.7*  --  7.8*  --   --  7.4* 7.6*  --   --   MG   < >  --  1.3*   < > 2.6*   < > 1.9 2.1 1.9 1.8 1.9  PHOS  --   --  2.3*   < > 2.8   < > 2.5 3.1 2.5 2.7 2.7   < > = values in this interval not displayed.   GFR: Estimated Creatinine Clearance: 55.1 mL/min (A) (by C-G formula based on SCr of 1.28 mg/dL (H)). Recent Labs  Lab 07/10/20 1727 07/11/20 0411 07/12/20 0330 07/12/20 1204 07/13/20 0013 07/13/20 1025 07/14/20 0458  PROCALCITON  --   --   --  8.78 9.30  --  7.60  WBC 15.9* 21.4* 29.3*  --  21.5*  --   --   LATICACIDVEN  --   --   --  1.1  --  1.0  --     Liver Function Tests: Recent Labs  Lab 07/09/20 0350 07/10/20 1727 07/11/20 0411 07/12/20 0330 07/13/20 0013  AST 50* 50* 53* 55* 70*  ALT 38 31 32 29 30  ALKPHOS 205* 208* 214* 214* 298*  BILITOT 4.0* 4.9* 5.2* 5.0* 4.6*  PROT 5.4* 5.1* 5.5* 5.2* 5.1*  ALBUMIN 1.8* 1.7* 1.9* 1.7* 1.8*   No results for input(s): LIPASE, AMYLASE in the last 168 hours. Recent Labs  Lab 07/10/20 1727  AMMONIA 24    ABG No results found for: PHART, PCO2ART,  PO2ART, HCO3, TCO2, ACIDBASEDEF, O2SAT   Coagulation Profile: No results for input(s): INR, PROTIME in the last 168 hours.  Cardiac Enzymes: Recent Labs  Lab 07/10/20 0541  CKTOTAL 14*    HbA1C: No results found for: HGBA1C  CBG: Recent Labs  Lab 07/13/20 1215 07/13/20 1607 07/13/20 1940 07/13/20 2350 07/14/20 0335  GLUCAP 106* 135* 170* 168* 176*     This patient is critically ill with multiple organ system failure which requires frequent high complexity decision making, assessment, support, evaluation, and titration of therapies. This was completed through the application of advanced monitoring technologies and extensive interpretation of multiple databases. During this encounter critical care time was devoted to patient care services described in this note for 35 minutes.  Julian Hy, DO 07/14/20 11:28 AM [facility]

## 2020-07-14 NOTE — Progress Notes (Signed)
Spoke with Jeani Hawking at the transfer center at [place] [phone]. She called to receive an update on patient and to make sure patient was still ICU status. There is still no bed available at this time.

## 2020-07-15 ENCOUNTER — Inpatient Hospital Stay (HOSPITAL_COMMUNITY): Payer: Medicare Other

## 2020-07-15 DIAGNOSIS — D84821 Immunodeficiency due to drugs: Secondary | ICD-10-CM | POA: Diagnosis not present

## 2020-07-15 DIAGNOSIS — R1114 Bilious vomiting: Secondary | ICD-10-CM

## 2020-07-15 DIAGNOSIS — I959 Hypotension, unspecified: Secondary | ICD-10-CM

## 2020-07-15 DIAGNOSIS — Z940 Kidney transplant status: Secondary | ICD-10-CM | POA: Diagnosis not present

## 2020-07-15 DIAGNOSIS — Z7952 Long term (current) use of systemic steroids: Secondary | ICD-10-CM

## 2020-07-15 DIAGNOSIS — C649 Malignant neoplasm of unspecified kidney, except renal pelvis: Secondary | ICD-10-CM | POA: Diagnosis not present

## 2020-07-15 DIAGNOSIS — N179 Acute kidney failure, unspecified: Secondary | ICD-10-CM | POA: Diagnosis not present

## 2020-07-15 DIAGNOSIS — R197 Diarrhea, unspecified: Secondary | ICD-10-CM

## 2020-07-15 DIAGNOSIS — C787 Secondary malignant neoplasm of liver and intrahepatic bile duct: Secondary | ICD-10-CM | POA: Diagnosis not present

## 2020-07-15 DIAGNOSIS — C799 Secondary malignant neoplasm of unspecified site: Secondary | ICD-10-CM

## 2020-07-15 LAB — GLUCOSE, CAPILLARY
Glucose-Capillary: 148 mg/dL — ABNORMAL HIGH (ref 70–99)
Glucose-Capillary: 174 mg/dL — ABNORMAL HIGH (ref 70–99)
Glucose-Capillary: 149 mg/dL — ABNORMAL HIGH (ref 70–99)
Glucose-Capillary: 86 mg/dL (ref 70–99)
Glucose-Capillary: 172 mg/dL — ABNORMAL HIGH (ref 70–99)

## 2020-07-15 LAB — COMPREHENSIVE METABOLIC PANEL
ALT: 59 U/L — ABNORMAL HIGH (ref 0–44)
AST: 200 U/L — ABNORMAL HIGH (ref 15–41)
Albumin: 1.8 g/dL — ABNORMAL LOW (ref 3.5–5.0)
Alkaline Phosphatase: 625 U/L — ABNORMAL HIGH (ref 38–126)
Anion gap: 10 (ref 5–15)
BUN: 40 mg/dL — ABNORMAL HIGH (ref 6–20)
CO2: 20 mmol/L — ABNORMAL LOW (ref 22–32)
Calcium: 7.9 mg/dL — ABNORMAL LOW (ref 8.9–10.3)
Chloride: 115 mmol/L — ABNORMAL HIGH (ref 98–111)
Creatinine, Ser: 1.17 mg/dL — ABNORMAL HIGH (ref 0.44–1.00)
GFR, Estimated: 59 mL/min — ABNORMAL LOW (ref >60)
Glucose, Bld: 93 mg/dL (ref 70–99)
Potassium: 3.3 mmol/L — ABNORMAL LOW (ref 3.5–5.1)
Sodium: 145 mmol/L (ref 135–145)
Total Bilirubin: 6.4 mg/dL — ABNORMAL HIGH (ref 0.3–1.2)
Total Protein: 5.7 g/dL — ABNORMAL LOW (ref 6.5–8.1)

## 2020-07-15 LAB — URINE CULTURE: Culture: NO GROWTH

## 2020-07-15 IMAGING — NM NM HEPATOBILIARY IMAGE, INC GB
2 series · 12 of 12 positions shown · non-contrast
Comparison: None.

CLINICAL DATA: Right upper quadrant pain.

EXAM:
NUCLEAR MEDICINE HEPATOBILIARY IMAGING
TECHNIQUE: Sequential images of the abdomen were obtained [DATE] minutes
following intravenous administration of radiopharmaceutical.
RADIOPHARMACEUTICALS:  8.0 mCi [[place]]  Choletec IV

[Series 1: hida scan hr 1 · 3.28mm/px · 6 of 60 frames shown]
[frame 6/60]
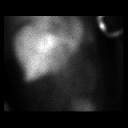
[frame 16/60]
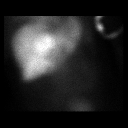
[frame 26/60]
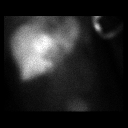
[frame 36/60]
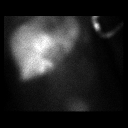
[frame 46/60]
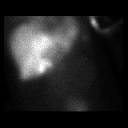
[frame 56/60]
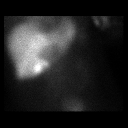

[Series 1: hida scan 90 min · 3.28mm/px · 6 of 30 frames shown]
[frame 3/30]
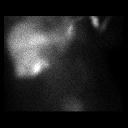
[frame 8/30]
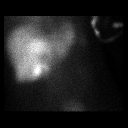
[frame 13/30]
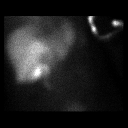
[frame 18/30]
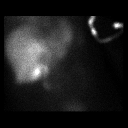
[frame 23/30]
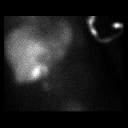
[frame 28/30]
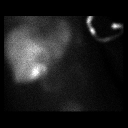

[12 of 12 positions shown; findings below may reference images not displayed]

FINDINGS: Prompt uptake and biliary excretion of activity by the liver is
seen. Gallbladder activity is visualized, consistent with patency of
cystic duct. Biliary activity passes into small bowel, consistent
with patent common bile duct.
IMPRESSION: Prompt visualization of the gallbladder without evidence of acute
cholecystitis.

## 2020-07-15 MED ORDER — METOPROLOL TARTRATE 12.5 MG HALF TABLET
12.5000 mg | ORAL_TABLET | Freq: Two times a day (BID) | ORAL | Status: DC
  Administered 2020-07-15 – 2020-07-16 (×3): 12.5 mg via ORAL
  Filled 2020-07-15 (×3): qty 1

## 2020-07-15 MED ORDER — TECHNETIUM TC 99M MEBROFENIN IV KIT
8.0000 | PACK | Freq: Once | INTRAVENOUS | Status: AC
  Administered 2020-07-15: 8 via INTRAVENOUS

## 2020-07-15 MED ORDER — PROMETHAZINE HCL 25 MG/ML IJ SOLN: 6.2500 mg | Freq: Two times a day (BID) | INTRAMUSCULAR | Status: DC | PRN

## 2020-07-15 MED ORDER — SODIUM CHLORIDE 0.9 % IV SOLN
1.0000 g | Freq: Three times a day (TID) | INTRAVENOUS | Status: DC
  Administered 2020-07-15 – 2020-07-16 (×3): 1 g via INTRAVENOUS
  Filled 2020-07-15 (×4): qty 1

## 2020-07-15 MED ORDER — METOCLOPRAMIDE HCL 5 MG/ML IJ SOLN: 10.0000 mg | Freq: Four times a day (QID) | INTRAMUSCULAR | Status: DC | PRN

## 2020-07-15 MED ORDER — HYDROCORTISONE NA SUCCINATE PF 100 MG IJ SOLR: 100.0000 mg | Freq: Two times a day (BID) | INTRAMUSCULAR | Status: DC

## 2020-07-15 MED ORDER — LACTATED RINGERS IV BOLUS
1000.0000 mL | Freq: Once | INTRAVENOUS | Status: AC
  Administered 2020-07-15: 1000 mL via INTRAVENOUS

## 2020-07-15 MED ORDER — HYDROCORTISONE NA SUCCINATE PF 100 MG IJ SOLR
100.0000 mg | Freq: Three times a day (TID) | INTRAMUSCULAR | Status: DC
  Administered 2020-07-15 – 2020-07-16 (×4): 100 mg via INTRAVENOUS
  Filled 2020-07-15 (×4): qty 2

## 2020-07-15 NOTE — Progress Notes (Addendum)
NAME:  Candace Griffin, MRN:  [id number], DOB:  01-02-1976, LOS: 38 ADMISSION DATE:  06/26/2020, CONSULTATION DATE:  07/13/20 REFERRING MD:  Alfredia Ferguson, CHIEF COMPLAINT:  shock   Brief History   Shock, presumed septic. Newly diagnosed metastatic RCC receiving chemo. ESRD s/p renal transplant 2 years ago on MMF, tacrolimus, prednisone.  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  She was recently diagnosed with metastatic (bone, liver) renal cell carcinoma of unknown primary.  She was admitted on 11/3 with abdominal pain and intractable nausea and vomiting.  She has had persistent difficulty tolerating p.o. intake, requiring postpyloric enteral feedings.  She has developed encephalopathy of unknown etiology.  Neurology has been consulted and has been providing supplemental thiamine.  ID was consulted, but no source of infection has been identified.  Antibiotics stopped on 11/19.  She has had an AKI throughout the admission with peak creatinine 1.42.  This is improved with volume resuscitation.  Overnight she developed hypotension, partially responsive to fluids.  She has remained on her immunosuppressive medications throughout her admission at the direction of her transplant physicians at [facility].  She was accepted for transfer to [facility] on 11/19, but beds have so far not been available. 11/20 moved to ICU due to shock 11/21 off pressors, encephalopathy better  Past Medical History  ESRD Renal transplant x 2, most recent 2019 at [facility]:  PCCM  Procedures:  RIJ CVC 11/20   Significant Diagnostic Tests:  MRI brain 11/19-unremarkable MRI spine- bone mets to T12, L1, L2 vertebral bodies, right ischial tuberosity. Bulky retroperitoneal, iliac, inguinal adenopathy.   Echo 07/06/20> LVEF greater than 75%, hyperdynamic  function.  Indeterminate diastolic function.  Normal RV.  Mild TR, otherwise normal valves.  Micro Data:  11/3 blood-no growth 11/17 urine-no growth 11/18 blood>> 11/20 blood>> 11/20 urine>>neg  Antimicrobials:  Cefepime 11/3 Flagyl 11/3 Zosyn 11/5-11/8 Azithromycin 11/17-11/18 Ceftriaxone 11/17-11/18 vanc 11/3, 11/20> Meropenem 11/20>  Interim history/subjective:  No complaints over night. Wants some water   Objective   Blood pressure (Abnormal) 166/125, pulse (Abnormal) 125, temperature (Abnormal) 97.5 F (36.4 C), temperature source Oral, resp. rate (Abnormal) 32, height 5\' 7"  (1.702 m), weight 78 kg, SpO2 99 %.        Intake/Output Summary (Last 24 hours) at 07/15/2020 0825 Last data filed at 07/15/2020 0400 Gross per 24 hour  Intake 786.03 ml  Output no documentation  Net 786.03 ml   Filed Weights   07/12/20 1630 07/13/20 0500 07/15/20 0415  Weight: 72.1 kg 72.4 kg 78 kg    Examination: General: Chronically ill 44 year old black female sitting up in bed she is not in distress HEENT normocephalic atraumatic sclera nonicteric does have temporal wasting mucous membranes are moist neck veins flat right IJ triple-lumen catheter in place Cardiac: Tachycardic regular rhythm sinus tach on telemetry Pulmonary: Clear to auscultation diminished bases Abdomen distended tender in the midepigastric area hypoactive bowel sounds Extremities pitting edema to level of knees brisk capillary refill pulses palpable Neuro awake oriented no focal deficits GU clear yellow  Resolved Hospital Problem list     Assessment & Plan:  Shock (now resolved)-presumed septic and in immunocompromised patient (NOS).  Previously normal EF on echocardiogram.  No reason to suspect acute coronary syndrome.  PE less likely given lack of hypoxia. Possibly exacerbated by adrenal insufficiency with chronic low-dose -off  pressors Plan Holding mycophenolate and tacrolimus for now Day 3 vanc and  meropenem ->will dc vanc F/u blood cultures Can start to taper steroids (but will get plan to decide on when resuming her rejection meds)  Acute encephalopathy-likely related to multiple antiemetics that have been required during this admission.  Brain MRI normal.  This appears to have normalized Plan Cont thiamine repletion  Additional recs per neurology   Malnutrition-moderate Plan Cont tube feeds and thiamine  Intractable nausea and vomiting (seemingly resolved) Plan Cont post-pyloric TFs ppi bid PRN phenergan  We will allow sips of water  Tachycardia-likely reactive Plan Tele  rx pain  Low dose lopressor   AKI History of end-stage renal disease due to chronic glomerulonephritis, status post renal transplant x2; last scr on 11/21 trending Up Plan Cont to renal dose beds Avoid hypotension Strict I&O Stress dose steroids for now; will need to determine timing of resuming immunosuppression (ideally once cultures resulted back)   RCC  Plan Holding chemo  Hyperbilirubinemia; possibly GB sludge on CT -Right upper quadrant ultrasound reviewed Plan Repeat chem this am HIDA scan    [facility] transfer center updated on ICU status. Still planning on transfer when a bed is available.  Best practice:  Diet: N.p.o. Pain/Anxiety/Delirium protocol (if indicated): Oxycodone for pain VAP protocol (if indicated): n/a DVT prophylaxis: Lovenox GI prophylaxis: Pantoprazole Glucose control: n/a Mobility: progressive Code Status: Full Family Communication: Mother updated 11/21 at bedside Disposition: ICU-->back to triad   Erick Colace ACNP-BC [place] Pager # [phone] OR # [phone] if no answer

## 2020-07-15 NOTE — Progress Notes (Signed)
Subjective: No new complaints   Antibiotics:  Anti-infectives (From admission, onward)   Start     Dose/Rate Route Frequency Ordered Stop   07/15/20 2000  meropenem (MERREM) 1 g in sodium chloride 0.9 % 100 mL IVPB        1 g 200 mL/hr over 30 Minutes Intravenous Every 8 hours 07/15/20 1519     07/14/20 0000  vancomycin (VANCOREADY) IVPB 750 mg/150 mL  Status:  Discontinued        750 mg 150 mL/hr over 60 Minutes Intravenous Every 12 hours 07/13/20 1030 07/15/20 1416   07/13/20 1200  meropenem (MERREM) 2 g in sodium chloride 0.9 % 100 mL IVPB  Status:  Discontinued        2 g 200 mL/hr over 30 Minutes Intravenous Every 8 hours 07/13/20 1030 07/15/20 1519   07/13/20 1130  vancomycin (VANCOREADY) IVPB 1500 mg/300 mL        1,500 mg 150 mL/hr over 120 Minutes Intravenous  Once 07/13/20 1030 07/13/20 1355   07/10/20 2100  azithromycin (ZITHROMAX) 500 mg in sodium chloride 0.9 % 250 mL IVPB  Status:  Discontinued        500 mg 250 mL/hr over 60 Minutes Intravenous Every 24 hours 07/10/20 1800 07/12/20 1439   07/10/20 2000  cefTRIAXone (ROCEPHIN) 2 g in sodium chloride 0.9 % 100 mL IVPB  Status:  Discontinued        2 g 200 mL/hr over 30 Minutes Intravenous Every 24 hours 07/10/20 1800 07/12/20 1439   06/28/20 1600  piperacillin-tazobactam (ZOSYN) IVPB 3.375 g  Status:  Discontinued        3.375 g 12.5 mL/hr over 240 Minutes Intravenous Every 8 hours 06/28/20 1508 07/02/20 0807   06/27/20 1200  ceFEPIme (MAXIPIME) 2 g in sodium chloride 0.9 % 100 mL IVPB  Status:  Discontinued        2 g 200 mL/hr over 30 Minutes Intravenous Every 8 hours 06/27/20 0821 06/27/20 0901   06/27/20 0500  vancomycin (VANCOREADY) IVPB 500 mg/100 mL  Status:  Discontinued        500 mg 100 mL/hr over 60 Minutes Intravenous Every 12 hours 06/26/20 1830 06/27/20 0901   06/27/20 0300  ceFEPIme (MAXIPIME) 2 g in sodium chloride 0.9 % 100 mL IVPB  Status:  Discontinued        2 g 200 mL/hr over 30  Minutes Intravenous Every 12 hours 06/26/20 1830 06/27/20 0821   06/26/20 1400  ceFEPIme (MAXIPIME) 2 g in sodium chloride 0.9 % 100 mL IVPB        2 g 200 mL/hr over 30 Minutes Intravenous  Once 06/26/20 1345 06/26/20 1509   06/26/20 1400  metroNIDAZOLE (FLAGYL) IVPB 500 mg        500 mg 100 mL/hr over 60 Minutes Intravenous  Once 06/26/20 1345 06/26/20 1736   06/26/20 1400  vancomycin (VANCOCIN) IVPB 1000 mg/200 mL premix        1,000 mg 200 mL/hr over 60 Minutes Intravenous  Once 06/26/20 1345 06/26/20 1741      Medications: Scheduled Meds: . Chlorhexidine Gluconate Cloth  6 each Topical Daily  . enoxaparin (LOVENOX) injection  40 mg Subcutaneous QHS  . feeding supplement (PROSource TF)  45 mL Per Tube BID  . hydrocortisone sod succinate (SOLU-CORTEF) inj  100 mg Intravenous Q8H  . insulin aspart  3-9 Units Subcutaneous Q4H  . mouth rinse  15 mL Mouth Rinse BID  .  metoprolol tartrate  12.5 mg Oral BID  . oxyCODONE  5 mg Per Tube Q6H  . pantoprazole sodium  40 mg Per Tube BID  . scopolamine  1 patch Transdermal Q72H  . sodium chloride flush  10-40 mL Intracatheter Q12H  . sodium chloride flush  10-40 mL Intracatheter Q12H  . sucralfate  1 g Oral TID WC & HS   Continuous Infusions: . feeding supplement (OSMOLITE 1.5 CAL) Stopped (07/15/20 0913)  . lactated ringers    . meropenem (MERREM) IV    . thiamine injection 250 mg (07/15/20 1332)   PRN Meds:.acetaminophen **OR** acetaminophen, diphenhydrAMINE, gadobutrol, LORazepam, magic mouthwash w/lidocaine, metoCLOPramide (REGLAN) injection, morphine injection, ondansetron **OR** ondansetron (ZOFRAN) IV, oxyCODONE, promethazine, sodium chloride flush, sodium chloride flush    Objective: Weight change:   Intake/Output Summary (Last 24 hours) at 07/15/2020 1559 Last data filed at 07/15/2020 0913 Gross per 24 hour  Intake 2203.25 ml  Output --  Net 2203.25 ml   Blood pressure (!) 135/106, pulse (!) 107, temperature (!) 97.5  F (36.4 C), temperature source Axillary, resp. rate 20, height 5\' 7"  (1.702 m), weight 78 kg, SpO2 97 %. Temp:  [97.4 F (36.3 C)-98.2 F (36.8 C)] 97.5 F (36.4 C) (11/22 1200) Pulse Rate:  [107-125] 107 (11/22 1000) Resp:  [16-37] 20 (11/22 1000) BP: (119-166)/(79-125) 135/106 (11/22 1000) SpO2:  [95 %-100 %] 97 % (11/22 1000) Weight:  [78 kg] 78 kg (11/22 0415)  Physical Exam: General: Alert and awake, oriented x3 tired appearing HEENT: anicteric sclera, EOMI central line in place CVS regular rate, normal no mgr Chest: , no wheezing, no respiratory distress , lungs clear Abdomen: soft non-distended, ? Tenderness in RUQ Skin: no rashes Neuro: nonfocal  CBC:    BMET Recent Labs    07/13/20 0013 07/14/20 0742  NA 140 141  K 4.5 3.8  CL 109 112*  CO2 20* 18*  GLUCOSE 111* 270*  BUN 24* 34*  CREATININE 1.28* 1.33*  CALCIUM 7.6* 7.7*     Liver Panel  Recent Labs    07/13/20 0013  PROT 5.1*  ALBUMIN 1.8*  AST 70*  ALT 30  ALKPHOS 298*  BILITOT 4.6*       Sedimentation Rate No results for input(s): ESRSEDRATE in the last 72 hours. C-Reactive Protein No results for input(s): CRP in the last 72 hours.  Micro Results: Recent Results (from the past 720 hour(s))  Culture, blood (routine x 2)     Status: None   Collection Time: 06/26/20  2:32 PM   Specimen: Site Not Specified; Blood  Result Value Ref Range Status   Specimen Description   Final    SITE NOT SPECIFIED Performed at [place] [address] [place], [place] [address]    Special Requests   Final    BOTTLES DRAWN AEROBIC AND ANAEROBIC Blood Culture adequate volume Performed at [place] [address] [place], [place] [address]    Culture   Final    NO GROWTH 5 DAYS Performed at [facility], [place] [address], [place], [place] [address]    Report Status 07/01/2020 FINAL  Final  Culture, blood (routine x 2)     Status: None    Collection Time: 06/26/20  2:50 PM   Specimen: Site Not Specified; Blood  Result Value Ref Range Status   Specimen Description   Final    SITE NOT SPECIFIED Performed at [place] [address]  [place], [place] [address]    Special Requests   Final    BOTTLES DRAWN AEROBIC AND ANAEROBIC Blood Culture results may not be optimal due to an inadequate volume of blood received in culture bottles Performed at [place] [address] [place], [place] [address]    Culture   Final    NO GROWTH 5 DAYS Performed at [facility], [place] [address] [place], [place] [address]    Report Status 07/01/2020 FINAL  Final  Respiratory Panel by RT PCR (Flu A&B, Covid) - Nasopharyngeal Swab     Status: None   Collection Time: 06/26/20  4:43 PM   Specimen: Nasopharyngeal Swab  Result Value Ref Range Status   SARS Coronavirus 2 by RT PCR NEGATIVE NEGATIVE Final    Comment: (NOTE) SARS-CoV-2 target nucleic acids are NOT DETECTED.  The SARS-CoV-2 RNA is generally detectable in upper respiratoy specimens during the acute phase of infection. The lowest concentration of SARS-CoV-2 viral copies this assay can detect is 131 copies/mL. A negative result does not preclude SARS-Cov-2 infection and should not be used as the sole basis for treatment or other patient management decisions. A negative result may occur with  improper specimen collection/handling, submission of specimen other than nasopharyngeal swab, presence of viral mutation(s) within the areas targeted by this assay, and inadequate number of viral copies (<131 copies/mL). A negative result must be combined with clinical observations, patient history, and epidemiological information. The expected result is Negative.  Fact Sheet for Patients:  PinkCheek.be  Fact Sheet for Healthcare Providers:  GravelBags.it  This test is no t yet  approved or cleared by the [place] FDA and  has been authorized for detection and/or diagnosis of SARS-CoV-2 by FDA under an Emergency Use Authorization (EUA). This EUA will remain  in effect (meaning this test can be used) for the duration of the COVID-19 declaration under Section 564(b)(1) of the Act, 21 U.S.C. section 360bbb-3(b)(1), unless the authorization is terminated or revoked sooner.     Influenza A by PCR NEGATIVE NEGATIVE Final   Influenza B by PCR NEGATIVE NEGATIVE Final    Comment: (NOTE) The Xpert Xpress SARS-CoV-2/FLU/RSV assay is intended as an aid in  the diagnosis of influenza from Nasopharyngeal swab specimens and  should not be used as a sole basis for treatment. Nasal washings and  aspirates are unacceptable for Xpert Xpress SARS-CoV-2/FLU/RSV  testing.  Fact Sheet for Patients: PinkCheek.be  Fact Sheet for Healthcare Providers: GravelBags.it  This test is not yet approved or cleared by the [place] FDA and  has been authorized for detection and/or diagnosis of SARS-CoV-2 by  FDA under an Emergency Use Authorization (EUA). This EUA will remain  in effect (meaning this test can be used) for the duration of the  Covid-19 declaration under Section 564(b)(1) of the Act, 21  U.S.C. section 360bbb-3(b)(1), unless the authorization is  terminated or revoked. Performed at [facility], [place] [address] [place], [place] [address]   MRSA PCR Screening     Status: None   Collection Time: 06/26/20  6:08 PM   Specimen: Nasal Mucosa; Nasopharyngeal  Result Value Ref Range Status   MRSA by PCR NEGATIVE NEGATIVE Final    Comment:        The GeneXpert MRSA Assay (FDA approved for NASAL specimens only), is one component of a comprehensive MRSA colonization surveillance program. It is not intended to diagnose MRSA infection nor to guide or monitor treatment for MRSA  infections. Performed at [facility], [place] [address] [place], [place] [address]   Culture, Urine     Status: None   Collection Time: 07/10/20  5:54 PM   Specimen: Urine, Random  Result Value Ref Range Status   Specimen Description   Final    URINE, RANDOM Performed at [place] [address], [place], [place] [address]    Special Requests   Final    NONE Performed at [facility], [place] [address], [place], [place] [address]    Culture   Final    NO GROWTH Performed at [facility], [place] [address] [place], [place] [address]    Report Status 07/11/2020 FINAL  Final  Culture, blood (routine x 2)     Status: None (Preliminary result)   Collection Time: 07/11/20 10:47 AM   Specimen: BLOOD LEFT HAND  Result Value Ref Range Status   Specimen Description   Final    BLOOD LEFT HAND Performed at [place] [address] [place], [place] [address]    Special Requests   Final    BOTTLES DRAWN AEROBIC ONLY Blood Culture results may not be optimal due to an inadequate volume of blood received in culture bottles Performed at [place] [address] [place], [place] [address]    Culture   Final    NO GROWTH 4 DAYS Performed at [facility], [place] [address], [place], [place] [address]    Report Status PENDING  Incomplete  Culture, blood (routine x 2)     Status: None (Preliminary result)   Collection Time: 07/11/20 10:48 AM   Specimen: BLOOD LEFT HAND  Result Value Ref Range Status   Specimen Description   Final    BLOOD LEFT HAND Performed at [place] [address], [place], [place] [address]    Special Requests   Final    BOTTLES DRAWN AEROBIC ONLY Blood Culture results may not be optimal due to an inadequate volume of blood received in culture bottles Performed at [place] [address] [place], [place]  [address]    Culture   Final    NO GROWTH 4 DAYS Performed at [facility], [place] [address] [place], [place] [address]    Report Status PENDING  Incomplete  Culture, Urine     Status: None   Collection Time: 07/13/20  9:46 AM   Specimen: Urine, Random  Result Value Ref Range Status   Specimen Description   Final    URINE, RANDOM Performed at [place] [address] [place], [place] [address]    Special Requests   Final    NONE Performed at [facility], [place] [address] [place], [place] [address]    Culture   Final    NO GROWTH Performed at [facility], [place] [address], [place],  [address]    Report Status 07/15/2020 FINAL  Final    Studies/Results: US Abdomen Limited RUQ (LIVER/GB)  Result Date: 07/13/2020 CLINICAL DATA:  Concern for cholecystitis. EXAM: ULTRASOUND ABDOMEN LIMITED RIGHT UPPER QUADRANT COMPARISON:  Ultrasound June 27, 2020. CT scan July 12, 2020. FINDINGS: Gallbladder: Tumefactive sludge is seen in the gallbladder. A small amount of pericholecystic fluid is identified. No Murphy's sign is identified. No wall thickening or stones. Common bile duct: Diameter: 4 mm Liver: Diffusely heterogeneous consistent with multiple known lesions. A representative  metastatic lesion measures up to 3.2 cm. Portal vein is patent on color Doppler imaging with normal direction of blood flow towards the liver. Other: None. IMPRESSION: 1. There is significant sludge in the gallbladder. There is now a small amount of pericholecystic fluid. However, no Murphy's sign, wall thickening, or stone identified. A HIDA scan could further evaluate if the picture remains clinically ambiguous. 2. Known metastatic disease in the liver, better assessed on CT imaging. Electronically Signed   By: Dorise Bullion III M.D   On: 07/13/2020 18:18      Assessment/Plan:  INTERVAL HISTORY: patient has stablized    Principal Problem:    Pancreatitis Active Problems:   GERD   Status post kidney transplant   Hypertension   Symptomatic anemia   Metastatic carcinoma to liver (HCC)   AKI (acute kidney injury) ([place])   Hepatitis   Generalized abdominal pain   Renal cell carcinoma (HCC)   Non-intractable vomiting   Malnutrition of moderate degree   Abdominal pain   Dyspnea   Poor fluid intake    VIRGINIE JOSTEN is a 44 y.o. female with renal transplant and now found to have renal cell carcinoma with metastses to bones and liver who had been on antibiotics for possible pneumonia, which were then stopped. Cabozantinib given with worsening confusion and then episode hypotension sp IV vancomycin and meropenem, pressors which have subsquently been weaned.   She is awaiting HIDA scan  Given risk of nephrotoxicity and no organisms or pathology seen that requires anti MRSA coverage would DC vancomycin  Continue merrem   She is awaiting transfer to [facility]     LOS: 19 days   Alcide Evener 07/15/2020, 3:59 PM

## 2020-07-15 NOTE — Progress Notes (Addendum)
HEMATOLOGY-ONCOLOGY PROGRESS NOTE  SUBJECTIVE: Events from this past weekend noted.  Now off pressors.  Encephalopathy significantly improved.  Reports mild abdominal discomfort in the right upper quadrant.  No nausea or vomiting reported.  Tolerating tube feeds.  Abdominal ultrasound performed over the weekend showed significant sludge in the gallbladder.  HIDA scan pending for later today.  The patient is awaiting transfer to [facility].   Oncology History  Metastatic carcinoma to liver ([place])  05/30/2020 Initial Diagnosis   Metastatic carcinoma to liver ([place])   06/13/2020 -  Chemotherapy   The patient had dexamethasone (DECADRON) 4 MG tablet, 8 mg, Oral, Daily, 1 of 1 cycle, Start date: 06/10/2020, End date: -- palonosetron (ALOXI) injection 0.25 mg, 0.25 mg, Intravenous,  Once, 1 of 4 cycles Administration: 0.25 mg (06/13/2020) pegfilgrastim-jmdb (FULPHILA) injection 6 mg, 6 mg, Subcutaneous,  Once, 1 of 4 cycles Administration: 6 mg (06/15/2020) CARBOplatin (PARAPLATIN) 390 mg in sodium chloride 0.9 % 250 mL chemo infusion, 390 mg (78.9 % of original dose 491.5 mg), Intravenous,  Once, 1 of 4 cycles Dose modification:   (original dose 491.5 mg, Cycle 1) Administration: 390 mg (06/13/2020) fosaprepitant (EMEND) 150 mg in sodium chloride 0.9 % 145 mL IVPB, 150 mg, Intravenous,  Once, 1 of 4 cycles Administration: 150 mg (06/13/2020) PACLitaxel (TAXOL) 54 mg in sodium chloride 0.9 % 150 mL chemo infusion (> 80mg /m2), 30 mg/m2 = 54 mg (60 % of original dose 50 mg/m2), Intravenous,  Once, 1 of 4 cycles Dose modification: 50 mg/m2 (original dose 50 mg/m2, Cycle 1, Reason: Change in LFTs), 30 mg/m2 (original dose 50 mg/m2, Cycle 1, Reason: Change in LFTs) Administration: 54 mg (06/13/2020)  for chemotherapy treatment.    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 with unknown primary site [facility])  06/10/2020 Initial Diagnosis   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 with  unknown primary site [facility])   06/13/2020 -  Chemotherapy   The patient had dexamethasone (DECADRON) 4 MG tablet, 8 mg, Oral, Daily, 1 of 1 cycle, Start date: 06/10/2020, End date: -- palonosetron (ALOXI) injection 0.25 mg, 0.25 mg, Intravenous,  Once, 1 of 4 cycles Administration: 0.25 mg (06/13/2020) pegfilgrastim-jmdb (FULPHILA) injection 6 mg, 6 mg, Subcutaneous,  Once, 1 of 4 cycles Administration: 6 mg (06/15/2020) CARBOplatin (PARAPLATIN) 390 mg in sodium chloride 0.9 % 250 mL chemo infusion, 390 mg (78.9 % of original dose 491.5 mg), Intravenous,  Once, 1 of 4 cycles Dose modification:   (original dose 491.5 mg, Cycle 1) Administration: 390 mg (06/13/2020) fosaprepitant (EMEND) 150 mg in sodium chloride 0.9 % 145 mL IVPB, 150 mg, Intravenous,  Once, 1 of 4 cycles Administration: 150 mg (06/13/2020) PACLitaxel (TAXOL) 54 mg in sodium chloride 0.9 % 150 mL chemo infusion (> 80mg /m2), 30 mg/m2 = 54 mg (60 % of original dose 50 mg/m2), Intravenous,  Once, 1 of 4 cycles Dose modification: 50 mg/m2 (original dose 50 mg/m2, Cycle 1, Reason: Change in LFTs), 30 mg/m2 (original dose 50 mg/m2, Cycle 1, Reason: Change in LFTs) Administration: 54 mg (06/13/2020)  for chemotherapy treatment.       REVIEW OF SYSTEMS:   Negative except as noted in the HPI.   PHYSICAL EXAMINATION: ECOG PERFORMANCE STATUS: 3 - Symptomatic, >50% confined to bed  Vitals:   07/15/20 1000 07/15/20 1200  BP: (!) 135/106   Pulse: (!) 107   Resp: 20   Temp:  (!) 97.5 F (36.4 C)  SpO2: 97%    Filed Weights  07/12/20 1630 07/13/20 0500 07/15/20 0415  Weight: 72.1 kg 72.4 kg 78 kg    Intake/Output from previous day: 11/21 0701 - 11/22 0700 In: 786 [P.O.:100; NG/GT:200; IV Piggyback:486] Out: -   . GENERAL: Awake and alert, no distress SKIN: no acute rashes, no significant lesions EYES: conjunctiva are pink and non-injected, sclera with mild icterus OROPHARYNX: MMM, no exudates, no oropharyngeal  erythema or ulceration NECK: supple, no JVD LYMPH:  no palpable lymphadenopathy in the cervical, axillary or inguinal regions LUNGS: clear to auscultation b/l with normal respiratory effort HEART: regular rate & rhythm ABDOMEN: Positive bowel sounds, soft, tenderness in the right upper quadrant Extremity: no pedal edema PSYCH: Alert and oriented x3.  LABORATORY DATA:  I have reviewed the data as listed CMP Latest Ref Rng & Units 07/03/2020 07/01/2020  Glucose 70 - 99 mg/dL 127(H) 102(H)  BUN 6 - 20 mg/dL 14 19  Creatinine 0.44 - 1.00 mg/dL 0.98 1.04(H)  Sodium 135 - 145 mmol/L 135 136  Potassium 3.5 - 5.1 mmol/L 3.7 4.9  Chloride 98 - 111 mmol/L 107 106  CO2 22 - 32 mmol/L 21(L) 19(L)  Calcium 8.9 - 10.3 mg/dL 8.0(L) 8.3(L)  Total Protein 6.5 - 8.1 g/dL 5.6(L) 5.5(L)  Total Bilirubin 0.3 - 1.2 mg/dL 4.0(H) 4.6(H)  Alkaline Phos 38 - 126 U/L 298(H) 389(H)  AST 15 - 41 U/L 65(H) 158(H)  ALT 0 - 44 U/L 124(H) 228(H)   . CBC Latest Ref Rng & Units 07/03/2020 07/01/2020  WBC 4.0 - 10.5 K/uL 15.9(H) 21.9(H)  Hemoglobin 12.0 - 15.0 g/dL 8.3(L) 8.5(L)  Hematocrit 36 - 46 % 25.9(L) 28.1(L)  Platelets 150 - 400 K/uL 189 155   CT ABDOMEN PELVIS WO CONTRAST  Result Date: 07/12/2020 CLINICAL DATA:  45 year old female with nausea and vomiting. Metastatic cancer of unknown primary. EXAM: CT CHEST, ABDOMEN AND PELVIS WITHOUT CONTRAST TECHNIQUE: Multidetector CT imaging of the chest, abdomen and pelvis was performed following the standard protocol without IV contrast. COMPARISON:  CT of the chest abdomen pelvis dated 07/01/2020. FINDINGS: CT CHEST FINDINGS Evaluation of this exam is limited in the absence of intravenous contrast. Cardiovascular: There is no cardiomegaly or pericardial effusion. There is mild atherosclerotic calcification of the aortic arch. The central pulmonary arteries are grossly unremarkable on this noncontrast CT. Mediastinum/Nodes: No definite hilar or mediastinal adenopathy.  Evaluation however is limited in the absence of intravenous contrast as well as due to consolidative changes of the lungs. A feeding tube noted within the esophagus. The thyroid gland is grossly unremarkable. No mediastinal fluid collection. Lungs/Pleura: Small bilateral pleural effusions. There are large areas of consolidation involving the lower lobes as well as smaller areas of patchy ground-glass density involving the right middle lobe, right upper lobe, and lingula most consistent with multifocal pneumonia. Aspiration is not excluded clinical correlation is recommended. There is no pneumothorax. The central airways are patent. Musculoskeletal: No chest wall mass or suspicious bone lesions identified. CT ABDOMEN PELVIS FINDINGS No intra-abdominal free air. Small perihepatic free fluid. Hepatobiliary: Several faint hepatic hypodense lesions consistent with metastatic disease. These were better seen on the contrast enhanced CT of 07/01/2020. no intrahepatic biliary dilatation. High attenuating content within the gallbladder may represent sludge or vicarious excretion of recently administered contrast. Pancreas: The pancreas is grossly unremarkable. Spleen: Normal in size without focal abnormality. Adrenals/Urinary Tract: The adrenal glands are suboptimally visualized. Severe bilateral renal atrophy. There is a left lower quadrant renal transplant. There is no hydronephrosis or nephrolithiasis of the  transplant kidney. No peritransplant fluid collection. There is duplicated appearance of the renal transplant collecting system. The urinary bladder is unremarkable. Stomach/Bowel: A feeding tube with tip in the distal duodenum. There is no bowel obstruction. The appendix is normal. Vascular/Lymphatic: Moderate aortoiliac atherosclerotic disease. No portal venous gas. Large and bulky retroperitoneal adenopathy as well as bilateral inguinal adenopathy. Large adenopathy along the left iliac chain. Reproductive: The  uterus is grossly unremarkable. Other: There is a 3.8 x 2.7 cm low attenuating collection with areas of calcification along the right pelvic sidewall similar to prior CT. This is not well characterized but may related to an old hematoma or necrotic collection. Musculoskeletal: There is diffuse subcutaneous edema and anasarca, new or worsened since the prior CT. Sclerotic osseous metastatic disease predominantly involving T12, L1, L2 as seen previously. Sclerotic metastatic involvement of the right pubic bone. No acute osseous pathology. IMPRESSION: 1. Multifocal pneumonia. Aspiration is not excluded. Clinical correlation is recommended. 2. Small bilateral pleural effusions, new or worsened since the prior CT. 3. Extensive retroperitoneal nodal metastatic disease as well as hepatic and osseous metastatic disease as seen previously. The liver lesions are better seen on the prior contrast enhanced CT. 4. Atrophic native kidneys with a Left lower quadrant renal transplant. No hydronephrosis or nephrolithiasis. 5. Diffuse subcutaneous edema and anasarca, new or worsened since the prior CT. 6. Aortic Atherosclerosis (ICD10-I70.0). Electronically Signed   By: Anner Crete M.D.   On: 07/12/2020 17:07   CT ABDOMEN PELVIS WO CONTRAST  Result Date: 06/26/2020 CLINICAL DATA:  Diffuse abdominal pain and vomiting. History of cancer and renal transplant. Liver mass post recent liver biopsy. EXAM: CT ABDOMEN AND PELVIS WITHOUT CONTRAST TECHNIQUE: Multidetector CT imaging of the abdomen and pelvis was performed following the standard protocol without IV contrast. COMPARISON:  Contrast-enhanced exam 05/27/2020. FINDINGS: Lower chest: No focal airspace disease, pleural fluid, or pulmonary nodularity. Heart is normal in size. Hepatobiliary: Patient's known hepatic lesions are not well demonstrated on noncontrast exam. Liver parenchyma is heterogeneous. There may be a small subcapsular hematoma inferiorly from recent liver  biopsy, not well-defined on this noncontrast exam. The gallbladder is present. There is high-density material in the gallbladder lumen, possibly sludge. No calcified gallstone. Common bile duct is poorly defined, but no evidence of biliary dilatation. Pancreas: No ductal dilatation or inflammation. Spleen: Normal in size without focal abnormality. Adrenals/Urinary Tract: Normal adrenal glands. Chronic bilateral native renal atrophy. Solid-appearing lesion in the lower left renal hilum was better appreciated on prior contrast-enhanced CT, grossly unchanged, series 2, image 35. Transplant kidney in the left lower quadrant. Scarring in the left lower pole is better appreciated on prior contrast-enhanced exam. Similar fullness of the renal collecting system without frank hydronephrosis. No transplant perinephric edema. Sequela of failed transplant in the right iliac fossa. The urinary bladder is unremarkable. Stomach/Bowel: Bowel evaluation is limited in the absence of enteric contrast. There is no bowel obstruction or evidence of bowel inflammation. The appendix is normal. Vascular/Lymphatic: Bulky retroperitoneal adenopathy, some of which is low-density and typical of necrosis. Occasional areas of nodal calcification. Adenopathy extends from the retrocrural space to the bilateral common iliac arteries, and left external iliac station. There also enlarged bilateral inguinal lymph nodes, left greater than right. Index aortocaval node measures 4.1 x 2.4 cm, previously 4.2 x 2.6 cm. Overall adenopathy is not significantly changed in the interim allowing for noncontrast technique. There is aortic and branch atherosclerosis. Reproductive: Bulky uterus with fibroids.  No obvious adnexal mass. Other: There  is trace free fluid in the pelvis, slightly increased from prior exam. No other free fluid or ascites. No free air. Musculoskeletal: Ill-defined sclerosis involving right L2 vertebral body, nonspecific but unchanged from  previous. Sclerotic density in the left acetabulum. There is hemi transitional lumbosacral anatomy. No fracture or acute osseous abnormality. IMPRESSION: 1. Patient's known hepatic lesions are not well-defined on this noncontrast exam. There may be a small subcapsular hematoma inferiorly from recent liver biopsy, not well-defined. No evidence of hemoperitoneum. 2. Bulky retroperitoneal and bilateral inguinal adenopathy, grossly unchanged from prior. 3. Transplant kidney in the left lower quadrant with unchanged fullness of the renal collecting system. Sequela of failed transplant in the right iliac fossa. Stable soft tissue density in the region of the native left kidney lower hilum. 4. Bulky uterus with fibroids. 5. Nonspecific sclerosis involving the right aspect of L2 vertebral body. Aortic Atherosclerosis (ICD10-I70.0). Electronically Signed   By: Keith Rake M.D.   On: 06/26/2020 16:45   CT HEAD WO CONTRAST  Result Date: 07/10/2020 CLINICAL DATA:  Delirium, history of urologic malignancy. EXAM: CT HEAD WITHOUT CONTRAST TECHNIQUE: Contiguous axial images were obtained from the base of the skull through the vertex without intravenous contrast. COMPARISON:  MRI 06/28/2020 FINDINGS: Brain: No evidence of acute infarction, hemorrhage, hydrocephalus, extra-axial collection, mass effect or visible mass lesion 4 within the limitations of an unenhanced CT exam. Basal cisterns are patent. Midline intracranial structures are normal. Cerebellar tonsils are normally positioned. Vascular: No hyperdense vessel or unexpected calcification. Skull: Few prominent venous lakes. No convincing focal sclerotic or lytic osseous metastatic disease. A previously seen C2 vertebral body metastasis is not within the margins of imaging. Scalp soft tissues are unremarkable. Sinuses/Orbits: Paranasal sinuses and mastoid air cells are predominantly clear. Included orbital structures are unremarkable. Other: None IMPRESSION: 1. No  acute intracranial findings. No visible metastatic lesions within the limitations of unenhanced CT imaging. 2. A previously seen C2 vertebral body metastasis is not within the margins of imaging. Electronically Signed   By: Lovena Le M.D.   On: 07/10/2020 19:59   CT CHEST WO CONTRAST  Result Date: 07/12/2020 CLINICAL DATA:  44 year old female with nausea and vomiting. Metastatic cancer of unknown primary. EXAM: CT CHEST, ABDOMEN AND PELVIS WITHOUT CONTRAST TECHNIQUE: Multidetector CT imaging of the chest, abdomen and pelvis was performed following the standard protocol without IV contrast. COMPARISON:  CT of the chest abdomen pelvis dated 07/01/2020. FINDINGS: CT CHEST FINDINGS Evaluation of this exam is limited in the absence of intravenous contrast. Cardiovascular: There is no cardiomegaly or pericardial effusion. There is mild atherosclerotic calcification of the aortic arch. The central pulmonary arteries are grossly unremarkable on this noncontrast CT. Mediastinum/Nodes: No definite hilar or mediastinal adenopathy. Evaluation however is limited in the absence of intravenous contrast as well as due to consolidative changes of the lungs. A feeding tube noted within the esophagus. The thyroid gland is grossly unremarkable. No mediastinal fluid collection. Lungs/Pleura: Small bilateral pleural effusions. There are large areas of consolidation involving the lower lobes as well as smaller areas of patchy ground-glass density involving the right middle lobe, right upper lobe, and lingula most consistent with multifocal pneumonia. Aspiration is not excluded clinical correlation is recommended. There is no pneumothorax. The central airways are patent. Musculoskeletal: No chest wall mass or suspicious bone lesions identified. CT ABDOMEN PELVIS FINDINGS No intra-abdominal free air. Small perihepatic free fluid. Hepatobiliary: Several faint hepatic hypodense lesions consistent with metastatic disease. These were  better seen on the  contrast enhanced CT of 07/01/2020. no intrahepatic biliary dilatation. High attenuating content within the gallbladder may represent sludge or vicarious excretion of recently administered contrast. Pancreas: The pancreas is grossly unremarkable. Spleen: Normal in size without focal abnormality. Adrenals/Urinary Tract: The adrenal glands are suboptimally visualized. Severe bilateral renal atrophy. There is a left lower quadrant renal transplant. There is no hydronephrosis or nephrolithiasis of the transplant kidney. No peritransplant fluid collection. There is duplicated appearance of the renal transplant collecting system. The urinary bladder is unremarkable. Stomach/Bowel: A feeding tube with tip in the distal duodenum. There is no bowel obstruction. The appendix is normal. Vascular/Lymphatic: Moderate aortoiliac atherosclerotic disease. No portal venous gas. Large and bulky retroperitoneal adenopathy as well as bilateral inguinal adenopathy. Large adenopathy along the left iliac chain. Reproductive: The uterus is grossly unremarkable. Other: There is a 3.8 x 2.7 cm low attenuating collection with areas of calcification along the right pelvic sidewall similar to prior CT. This is not well characterized but may related to an old hematoma or necrotic collection. Musculoskeletal: There is diffuse subcutaneous edema and anasarca, new or worsened since the prior CT. Sclerotic osseous metastatic disease predominantly involving T12, L1, L2 as seen previously. Sclerotic metastatic involvement of the right pubic bone. No acute osseous pathology. IMPRESSION: 1. Multifocal pneumonia. Aspiration is not excluded. Clinical correlation is recommended. 2. Small bilateral pleural effusions, new or worsened since the prior CT. 3. Extensive retroperitoneal nodal metastatic disease as well as hepatic and osseous metastatic disease as seen previously. The liver lesions are better seen on the prior contrast enhanced  CT. 4. Atrophic native kidneys with a Left lower quadrant renal transplant. No hydronephrosis or nephrolithiasis. 5. Diffuse subcutaneous edema and anasarca, new or worsened since the prior CT. 6. Aortic Atherosclerosis (ICD10-I70.0). Electronically Signed   By: Anner Crete M.D.   On: 07/12/2020 17:07   MR BRAIN WO CONTRAST  Result Date: 07/12/2020 CLINICAL DATA:  Metastatic carcinoma, mental status change EXAM: MRI HEAD WITHOUT CONTRAST TECHNIQUE: Multiplanar, multiecho pulse sequences of the brain and surrounding structures were obtained without intravenous contrast. COMPARISON:  06/28/2020 FINDINGS: Brain: There is no acute infarction or intracranial hemorrhage. There is no intracranial mass, mass effect, or edema. There is no hydrocephalus or extra-axial fluid collection. Ventricles are normal in size and configuration. Vascular: Major vessel flow voids at the skull base are preserved. Skull and upper cervical spine: Discrete C2 lesion on prior postcontrast imaging is not identified on this noncontrast study. Sinuses/Orbits: Paranasal sinuses are aerated. Orbits are unremarkable. Other: Sella is unremarkable.  Mastoid air cells are clear. IMPRESSION: No acute intracranial abnormality. Electronically Signed   By: Macy Mis M.D.   On: 07/12/2020 20:45   MR Brain W Wo Contrast  Result Date: 06/28/2020 CLINICAL DATA:  Urologic cancer, staging EXAM: MRI HEAD WITHOUT AND WITH CONTRAST TECHNIQUE: Multiplanar, multiecho pulse sequences of the brain and surrounding structures were obtained without and with intravenous contrast. CONTRAST:  61mL GADAVIST GADOBUTROL 1 MMOL/ML IV SOLN COMPARISON:  MRI head 11/17/2016 FINDINGS: Brain: No acute infarction, hemorrhage, hydrocephalus, extra-axial collection or mass lesion. Normal white matter. Negative for metastatic disease to the brain. No enhancing mass lesion in the brain. Vascular: Normal arterial flow voids. Skull and upper cervical spine: Enhancing  lesion in the lower body of the C2 vertebral body. This shows decreased signal prior to contrast and is consistent with metastatic disease. No fracture or cord compression. No skull lesion identified. Benign fatty lesion in the left parietal bone. Sinuses/Orbits: Paranasal sinuses  clear.  Negative orbit Other: None IMPRESSION: Negative for metastatic disease to the brain 1 cm enhancing lesion C2 vertebral body compatible with metastatic disease. No fracture or cord compression Electronically Signed   By: Franchot Gallo M.D.   On: 06/28/2020 21:31   MR LUMBAR SPINE WO CONTRAST  Result Date: 07/12/2020 CLINICAL DATA:  Initial evaluation for severe lower back pain and pelvic pain, history of renal transplant with metastatic carcinoma. EXAM: MRI LUMBAR SPINE WITHOUT CONTRAST MRI PELVIS WITHOUT CONTRAST TECHNIQUE: Multiplanar, multisequence MR imaging of the lumbar spine was performed. No intravenous contrast was administered. COMPARISON:  Prior CT from earlier the same day. FINDINGS: MRI LUMBAR SPINE FINDINGS: Segmentation: Standard. Lowest well-formed disc space labeled the L5-S1 level. Alignment: Physiologic with preservation of the normal lumbar lordosis. No listhesis or static subluxation. Vertebrae: Abnormal T1 hypointense, STIR hyperintense lesion seen involving the T12, L1, and L2 vertebral bodies, consistent with osseous metastatic disease. Involvement of these levels is fairly diffuse and infiltrative in nature. Heterogeneous marrow signal elsewhere within the visualized lumbosacral spine without definite discrete metastasis. No associated pathologic fracture. No extra osseous extension or epidural extension of tumor. Conus medullaris and cauda equina: Conus extends to the L1 level. Conus and cauda equina appear normal. Paraspinal and other soft tissues: Extensive bulky retroperitoneal and iliac adenopathy, better seen on recent CT. Associated reactive marrow edema within the posterior paraspinous and  psoas musculature. Marked atrophy of the native kidneys noted. Renal transplant within the left iliac fossa partially visualized. Disc levels: No significant disc pathology seen within the lumbar spine. Intervertebral discs are well hydrated with preserved disc height. No significant disc bulge or focal disc herniation. Minimal multilevel facet hypertrophy noted throughout the lumbar spine. Epidural lipomatosis noted within the lower lumbar spine. No significant spinal stenosis. Foramina remain patent. No impingement. MRI PELVIS FINDINGS: Examination is technically limited as the patient was unable to tolerate the full length of the examination, with the exam terminated early. Coronal T1 weighted sequence only was performed. Visualized bowels normal in caliber without evidence for obstruction. Normal appendix. Marked atrophy of the native kidneys with a renal transplant within the left iliac fossa. No appreciable hydronephrosis or other obvious abnormality about the renal transplant on this limited exam. Urinary bladder within normal limits. Uterus within normal limits. Few nabothian cysts noted about the cervix. Ovaries not well evaluated on this limited study. No appreciable adnexal mass. Normal flow voids seen throughout the visualized abdomen and pelvis. Extensive bulky retroperitoneal and iliac adenopathy again seen, better evaluated on dedicated CT from earlier the same day. For reference purposes, the largest nodal conglomerate seen within the lower right periaortic region and measures 5.0 x 4.7 cm (series 11, image 24). Scattered inguinal adenopathy noted as well, with largest node seen on the left and measuring 1.9 cm in short axis (series 11, image 26). Scattered small volume free fluid noted within the pelvis. Scattered edema within the psoas and paraspinous musculature better seen on corresponding lumbar spine portion of this exam. 4.6 x 4.3 cm heterogeneous and partially calcified collection at the  right lower quadrant again noted, nonspecific, but may be related to a chronic hematoma or other collection (series 10, image 57). Finding better evaluated on prior CT. Heterogeneous signal intensity seen throughout the bone marrow of the pelvis. Probable osseous metastasis measuring approximately 1.8 cm seen at the right ischial tuberosity (series 11, image 13). No other definite discrete osseous metastatic lesions seen on this limited exam. IMPRESSION: 1. Osseous metastatic disease  involving the T12, L1, and L2 vertebral bodies. No associated pathologic fracture or extra osseous extension of tumor. 2. Probable 1.8 cm osseous metastasis involving the right ischial tuberosity. 3. Extensive bulky retroperitoneal, iliac, and inguinal adenopathy as above, consistent with nodal metastatic disease. Findings better evaluated on recent CT. 4. Scattered edema within the posterior paraspinous and psoas musculature, with scattered small volume free fluid within the pelvis. Findings are likely reactive in nature. 5. Marked atrophy of the native kidneys with left lower quadrant renal transplant in place. No appreciable hydronephrosis or other obvious abnormality about the renal transplant on this limited exam. 6. No significant disc pathology or stenosis within the lumbar spine. No impingement. 7. Please note that the pelvic portion of this exam is markedly limited as the patient terminated the study early. Coronal T1 weighted sequence only was performed. Electronically Signed   By: Jeannine Boga M.D.   On: 07/12/2020 23:36   MR PELVIS WO CONTRAST  Result Date: 07/12/2020 CLINICAL DATA:  Initial evaluation for severe lower back pain and pelvic pain, history of renal transplant with metastatic carcinoma. EXAM: MRI LUMBAR SPINE WITHOUT CONTRAST MRI PELVIS WITHOUT CONTRAST TECHNIQUE: Multiplanar, multisequence MR imaging of the lumbar spine was performed. No intravenous contrast was administered. COMPARISON:  Prior CT  from earlier the same day. FINDINGS: MRI LUMBAR SPINE FINDINGS: Segmentation: Standard. Lowest well-formed disc space labeled the L5-S1 level. Alignment: Physiologic with preservation of the normal lumbar lordosis. No listhesis or static subluxation. Vertebrae: Abnormal T1 hypointense, STIR hyperintense lesion seen involving the T12, L1, and L2 vertebral bodies, consistent with osseous metastatic disease. Involvement of these levels is fairly diffuse and infiltrative in nature. Heterogeneous marrow signal elsewhere within the visualized lumbosacral spine without definite discrete metastasis. No associated pathologic fracture. No extra osseous extension or epidural extension of tumor. Conus medullaris and cauda equina: Conus extends to the L1 level. Conus and cauda equina appear normal. Paraspinal and other soft tissues: Extensive bulky retroperitoneal and iliac adenopathy, better seen on recent CT. Associated reactive marrow edema within the posterior paraspinous and psoas musculature. Marked atrophy of the native kidneys noted. Renal transplant within the left iliac fossa partially visualized. Disc levels: No significant disc pathology seen within the lumbar spine. Intervertebral discs are well hydrated with preserved disc height. No significant disc bulge or focal disc herniation. Minimal multilevel facet hypertrophy noted throughout the lumbar spine. Epidural lipomatosis noted within the lower lumbar spine. No significant spinal stenosis. Foramina remain patent. No impingement. MRI PELVIS FINDINGS: Examination is technically limited as the patient was unable to tolerate the full length of the examination, with the exam terminated early. Coronal T1 weighted sequence only was performed. Visualized bowels normal in caliber without evidence for obstruction. Normal appendix. Marked atrophy of the native kidneys with a renal transplant within the left iliac fossa. No appreciable hydronephrosis or other obvious  abnormality about the renal transplant on this limited exam. Urinary bladder within normal limits. Uterus within normal limits. Few nabothian cysts noted about the cervix. Ovaries not well evaluated on this limited study. No appreciable adnexal mass. Normal flow voids seen throughout the visualized abdomen and pelvis. Extensive bulky retroperitoneal and iliac adenopathy again seen, better evaluated on dedicated CT from earlier the same day. For reference purposes, the largest nodal conglomerate seen within the lower right periaortic region and measures 5.0 x 4.7 cm (series 11, image 24). Scattered inguinal adenopathy noted as well, with largest node seen on the left and measuring 1.9 cm in short axis (  series 11, image 26). Scattered small volume free fluid noted within the pelvis. Scattered edema within the psoas and paraspinous musculature better seen on corresponding lumbar spine portion of this exam. 4.6 x 4.3 cm heterogeneous and partially calcified collection at the right lower quadrant again noted, nonspecific, but may be related to a chronic hematoma or other collection (series 10, image 57). Finding better evaluated on prior CT. Heterogeneous signal intensity seen throughout the bone marrow of the pelvis. Probable osseous metastasis measuring approximately 1.8 cm seen at the right ischial tuberosity (series 11, image 13). No other definite discrete osseous metastatic lesions seen on this limited exam. IMPRESSION: 1. Osseous metastatic disease involving the T12, L1, and L2 vertebral bodies. No associated pathologic fracture or extra osseous extension of tumor. 2. Probable 1.8 cm osseous metastasis involving the right ischial tuberosity. 3. Extensive bulky retroperitoneal, iliac, and inguinal adenopathy as above, consistent with nodal metastatic disease. Findings better evaluated on recent CT. 4. Scattered edema within the posterior paraspinous and psoas musculature, with scattered small volume free fluid  within the pelvis. Findings are likely reactive in nature. 5. Marked atrophy of the native kidneys with left lower quadrant renal transplant in place. No appreciable hydronephrosis or other obvious abnormality about the renal transplant on this limited exam. 6. No significant disc pathology or stenosis within the lumbar spine. No impingement. 7. Please note that the pelvic portion of this exam is markedly limited as the patient terminated the study early. Coronal T1 weighted sequence only was performed. Electronically Signed   By: Jeannine Boga M.D.   On: 07/12/2020 23:36   CT ABDOMEN PELVIS W CONTRAST  Result Date: 07/01/2020 CLINICAL DATA:  Sepsis, abdominal pain, previous kidney transplant x2. Metastatic carcinoma of unknown primary. EXAM: CT ABDOMEN AND PELVIS WITH CONTRAST TECHNIQUE: Multidetector CT imaging of the abdomen and pelvis was performed using the standard protocol following bolus administration of intravenous contrast. CONTRAST:  49mL OMNIPAQUE IOHEXOL 300 MG/ML  SOLN COMPARISON:  06/26/2020 and previous FINDINGS: Lower chest: Trace pleural effusions. Hepatobiliary: Multiple liver lesions. 3.2 cm segment 2 lesion shows definite progression since 05/27/2020, although is more wedge-shaped and may represent focal infarct. Multiple low-attenuation lesions in the right lobe have not convincingly changed allowing for differences in scan timing. There are nonspecific transient hepatic attenuation differences on the portal venous phase which resolve on delayed venous phase imaging. No biliary ductal dilatation. Gallbladder unremarkable. Pancreas: Unremarkable. No pancreatic ductal dilatation or surrounding inflammatory changes. Spleen: Normal in size. There peripheral areas of decreased enhancement which were not present on prior study of 05/27/2020, however this area was not included on the delayed scan, and may represent transient attenuation differences versus true lesions. Adrenals/Urinary  Tract: Adrenal glands unremarkable. Marked atrophy of native kidneys. Normal enhancement of left iliac fossa transplant kidney. Coarse calcifications and low-attenuation in the right iliac fossa presumably related to remote transplant. The urinary bladder is incompletely distended. Stomach/Bowel: Stomach is decompressed. Small bowel is nondilated. The colon is incompletely distended, unremarkable. Vascular/Lymphatic: Bulky bilateral inguinal, left external iliac, bilateral common iliac, left para-aortic and aortocaval adenopathy as before. The larger nodes contain scattered calcifications and some with central low-attenuation regions as before. Moderate atheromatous aortoiliac plaque without aneurysm. Reproductive: Uterine fibroid.  No adnexal mass. Other: Small volume perihepatic and pelvic ascites slightly increased. No free air. Musculoskeletal: Poorly marginated sclerotic lesions in T12, L1, and L2 vertebral bodies, progressive since 05/16/2020. benign left supra-acetabular bone island stable since 02/03/2015. IMPRESSION: 1. No definite source of sepsis  identified. 2. Extensive pelvic and retroperitoneal nodal; hepatic; and osseous metastatic disease. 3. Slight increase in small volume perihepatic and pelvic ascites. Aortic Atherosclerosis (ICD10-I70.0). Electronically Signed   By: Lucrezia Europe M.D.   On: 07/01/2020 14:23   DG CHEST PORT 1 VIEW  Result Date: 07/13/2020 CLINICAL DATA:  Central line placement. EXAM: PORTABLE CHEST 1 VIEW COMPARISON:  07/12/2020 FINDINGS: Enteric tube courses into the region of the stomach and off the film as tip is not visualized. Interval placement of right IJ central venous catheter which has tip over the right atrium and could be pulled back 4-5 cm. Lungs are somewhat hypoinflated demonstrate mild asymmetric bilateral perihilar opacification left worse than right with slight interval improvement as this may be due to interstitial edema versus infection. No effusion. No  pneumothorax. Cardiomediastinal silhouette and remainder of the exam is unchanged. IMPRESSION: 1. Slight interval improvement in bilateral perihilar opacification left worse than right which may be due to interstitial edema versus infection. 2. Right IJ central venous catheter with tip over the right atrium and could be pulled back 4-5 cm. No pneumothorax. Electronically Signed   By: Marin Olp M.D.   On: 07/13/2020 09:59   DG CHEST PORT 1 VIEW  Result Date: 07/12/2020 CLINICAL DATA:  Dyspnea EXAM: PORTABLE CHEST 1 VIEW COMPARISON:  07/11/2020 FINDINGS: Supine chest radiograph. Lung volumes are small and pulmonary insufflation has diminished since prior examination. There is developing consolidation within the left mid lung zone, likely infectious in the acute setting. Linear atelectasis within the right mid lung zone. No pneumothorax or pleural effusion. Cardiac size within normal limits. Pulmonary vascularity is normal. IMPRESSION: Left mid lung zone consolidation, likely infectious. Electronically Signed   By: Fidela Salisbury MD   On: 07/12/2020 06:53   DG CHEST PORT 1 VIEW  Result Date: 07/11/2020 CLINICAL DATA:  Shortness of breath EXAM: PORTABLE CHEST 1 VIEW COMPARISON:  07/10/2020 FINDINGS: Hazy opacity of the left more than right lower chest. No visible effusion or pneumothorax. Normal heart size and mediastinal contours. IMPRESSION: Stable low volume chest with infiltrates or atelectasis at the bases. Electronically Signed   By: Monte Fantasia M.D.   On: 07/11/2020 07:39   DG CHEST PORT 1 VIEW  Result Date: 07/10/2020 CLINICAL DATA:  Liver carcinoma.  Hypertension. EXAM: PORTABLE CHEST 1 VIEW COMPARISON:  June 26, 2020 FINDINGS: There is consolidation in the left lower lobe with small left pleural effusion. There is ill-defined airspace opacity in the right base. Heart size and pulmonary vascularity are normal. No adenopathy. There is aortic atherosclerosis. No bone lesions. IMPRESSION:  Consolidation left lower lobe with small left pleural effusion. Patchy infiltrate right base. Heart size within normal limits. No adenopathy evident. Aortic Atherosclerosis (ICD10-I70.0). Electronically Signed   By: Lowella Grip III M.D.   On: 07/10/2020 13:48   DG Chest Port 1 View  Result Date: 06/26/2020 CLINICAL DATA:  Weakness EXAM: PORTABLE CHEST 1 VIEW COMPARISON:  05/26/2020 FINDINGS: The heart size and mediastinal contours are within normal limits. Both lungs are clear. No pleural effusion or pneumothorax. The visualized skeletal structures are unremarkable. IMPRESSION: No acute process in the chest. Electronically Signed   By: Macy Mis M.D.   On: 06/26/2020 13:45   DG Abd Portable 2V  Result Date: 07/08/2020 CLINICAL DATA:  Nausea and vomiting. EXAM: PORTABLE ABDOMEN - 2 VIEW COMPARISON:  07/01/2020 FINDINGS: Gas distended transverse colon but no overt obstructive pattern. No abnormal stool retention or evidence of small bowel obstruction. Clear  lung bases. Calcification in the right iliac fossa from failed transplant based on CT. IMPRESSION: Normal bowel gas pattern.  No abnormal stool retention. Electronically Signed   By: Monte Fantasia M.D.   On: 07/08/2020 11:18   DG [facility] Dys Tube Plc W/Fl W/Rad  Result Date: 07/12/2020 CLINICAL DATA:  Poor fluid intake. Hemodialysis patient. Failed bedside attempt at feeding tube placement. EXAM: FEEDING TUBE PLACEMENT UNDER FLUOROSCOPY CONTRAST:  Approximately 15 cc Omnipaque 300 per feeding tube. FLUOROSCOPY TIME:  Fluoroscopy Time: 4 minutes and 48 seconds of low-dose pulsed fluoroscopy. Radiation Exposure Index (if provided by the fluoroscopic device): 81.9 mGy Number of Acquired Spot Images: 0 COMPARISON:  Chest CT 07/13/2019 FINDINGS: Time out procedure was performed. A feeding tube was placed via the left nares and advanced into the esophagus and stomach under fluoroscopy. The tube was manipulated into the duodenum with the aid of a  stiff Amplatz wire. Tube position in the descending duodenum was confirmed with a spot image following the injection of a small amount of contrast through the tube. The patient tolerated the procedure without immediate complications. IMPRESSION: Feeding tube placement into the mid duodenum as described. Electronically Signed   By: Richardean Sale M.D.   On: 07/12/2020 14:27   EEG adult  Result Date: 07/11/2020 Lora Havens, MD     07/11/2020  3:41 PM Patient Name: SUMIE REMSEN MRN: [id number] Epilepsy Attending: Lora Havens Referring Physician/Provider: Dr Carlisle Cater Date: 07/11/2020 Duration: 24.15 minutes Patient history: 44 year old female with altered mental status.  EEG to evaluate for seizures. Level of alertness: Awake AEDs during EEG study: None Technical aspects: This EEG study was done with scalp electrodes positioned according to the 10-20 International system of electrode placement. Electrical activity was acquired at a sampling rate of 500Hz  and reviewed with a high frequency filter of 70Hz  and a low frequency filter of 1Hz . EEG data were recorded continuously and digitally stored. Description: The posterior dominant rhythm consists of 8 Hz activity of moderate voltage (25-35 uV) seen predominantly in posterior head regions, symmetric and reactive to eye opening and eye closing.  Hyperventilation and photic stimulation were not performed.   IMPRESSION: This study is within normal limits. No seizures or epileptiform discharges were seen throughout the recording. Lora Havens   ECHOCARDIOGRAM COMPLETE  Result Date: 07/06/2020    ECHOCARDIOGRAM REPORT   Patient Name:   JENASCIA BUMPASS Date of Exam: 07/06/2020 Medical Rec #:  [id number]           Height:       67.0 in Accession #:    [id number]          Weight:       126.3 lb Date of Birth:  1976/04/02            BSA:          1.664 m Patient Age:    44 years            BP:           128/88 mmHg Patient Gender: F                    HR:           131 bpm. Exam Location:  Inpatient Procedure: 2D Echo, Cardiac Doppler and Color Doppler Indications:    R00.2 Palpitations  History:        Patient has no prior history of Echocardiogram examinations.  Risk Factors:Hypertension. GERD. ESRD.  Sonographer:    Jonelle Sidle Dance Referring Phys: Sinclair  1. Left ventricular ejection fraction, by estimation, is >75%. The left ventricle has hyperdynamic function. The left ventricle has no regional wall motion abnormalities. Left ventricular diastolic parameters are indeterminate.  2. Right ventricular systolic function is normal. The right ventricular size is normal. There is normal pulmonary artery systolic pressure.  3. The mitral valve is normal in structure. No evidence of mitral valve regurgitation. No evidence of mitral stenosis.  4. The aortic valve is tricuspid. Aortic valve regurgitation is not visualized. No aortic stenosis is present.  5. The inferior vena cava is normal in size with greater than 50% respiratory variability, suggesting right atrial pressure of 3 mmHg. FINDINGS  Left Ventricle: Left ventricular ejection fraction, by estimation, is >75%. The left ventricle has hyperdynamic function. The left ventricle has no regional wall motion abnormalities. The left ventricular internal cavity size was normal in size. There is no left ventricular hypertrophy. Left ventricular diastolic parameters are indeterminate. Right Ventricle: The right ventricular size is normal.Right ventricular systolic function is normal. There is normal pulmonary artery systolic pressure. The tricuspid regurgitant velocity is 2.50 m/s, and with an assumed right atrial pressure of 3 mmHg, the estimated right ventricular systolic pressure is 71.0 mmHg. Left Atrium: Left atrial size was normal in size. Right Atrium: Right atrial size was normal in size. Pericardium: Trivial pericardial effusion is present. Mitral Valve: The  mitral valve is normal in structure. No evidence of mitral valve regurgitation. No evidence of mitral valve stenosis. Tricuspid Valve: The tricuspid valve is normal in structure. Tricuspid valve regurgitation is mild . No evidence of tricuspid stenosis. Aortic Valve: The aortic valve is tricuspid. Aortic valve regurgitation is not visualized. No aortic stenosis is present. Pulmonic Valve: The pulmonic valve was normal in structure. Pulmonic valve regurgitation is not visualized. No evidence of pulmonic stenosis. Aorta: The aortic root is normal in size and structure. Venous: The inferior vena cava is normal in size with greater than 50% respiratory variability, suggesting right atrial pressure of 3 mmHg.  LEFT VENTRICLE PLAX 2D LVIDd:         3.30 cm LVIDs:         2.50 cm LV PW:         0.90 cm LV IVS:        1.00 cm LVOT diam:     1.90 cm LV SV:         51 LV SV Index:   31 LVOT Area:     2.84 cm  RIGHT VENTRICLE             IVC RV Basal diam:  2.00 cm     IVC diam: 0.80 cm RV S prime:     15.90 cm/s TAPSE (M-mode): 1.9 cm LEFT ATRIUM             Index       RIGHT ATRIUM          Index LA diam:        2.90 cm 1.74 cm/m  RA Area:     6.11 cm LA Vol (A2C):   26.9 ml 16.17 ml/m RA Volume:   8.36 ml  5.03 ml/m LA Vol (A4C):   10.0 ml 6.00 ml/m LA Biplane Vol: 16.5 ml 9.92 ml/m  AORTIC VALVE LVOT Vmax:   112.00 cm/s LVOT Vmean:  78.300 cm/s LVOT VTI:    0.181 m  AORTA Ao  Root diam: 2.80 cm Ao Asc diam:  2.90 cm MITRAL VALVE               TRICUSPID VALVE MV Area (PHT): 4.85 cm    TR Peak grad:   25.0 mmHg MV Decel Time: 157 msec    TR Vmax:        250.00 cm/s MV E velocity: 98.00 cm/s                            SHUNTS                            Systemic VTI:  0.18 m                            Systemic Diam: 1.90 cm Kirk Ruths MD Electronically signed by Kirk Ruths MD Signature Date/Time: 07/06/2020/2:34:19 PM    Final    VAS [place] LOWER EXTREMITY VENOUS (DVT)  Result Date: 07/09/2020  Lower Venous DVT  Study Indications: Pain in thighs bilaterally.  Risk Factors: Cancer Metastatic carcinoma to liver, hx of RCC. Comparison Study: No prior studies. Performing Technologist: Darlin Coco, RDMS  Examination Guidelines: A complete evaluation includes B-mode imaging, spectral Doppler, color Doppler, and power Doppler as needed of all accessible portions of each vessel. Bilateral testing is considered an integral part of a complete examination. Limited examinations for reoccurring indications may be performed as noted. The reflux portion of the exam is performed with the patient in reverse Trendelenburg.  +---------+---------------+---------+-----------+----------+--------------+ RIGHT    CompressibilityPhasicitySpontaneityPropertiesThrombus Aging +---------+---------------+---------+-----------+----------+--------------+ CFV      Full           Yes      Yes                                 +---------+---------------+---------+-----------+----------+--------------+ SFJ      Full                                                        +---------+---------------+---------+-----------+----------+--------------+ FV Prox  Full                                                        +---------+---------------+---------+-----------+----------+--------------+ FV Mid   Full                                                        +---------+---------------+---------+-----------+----------+--------------+ FV DistalFull                                                        +---------+---------------+---------+-----------+----------+--------------+ PFV      Full                                                        +---------+---------------+---------+-----------+----------+--------------+  POP      Full           Yes      Yes                                 +---------+---------------+---------+-----------+----------+--------------+ PTV      Full                                                         +---------+---------------+---------+-----------+----------+--------------+ PERO     Full                                                        +---------+---------------+---------+-----------+----------+--------------+   +---------+---------------+---------+-----------+----------+--------------+ LEFT     CompressibilityPhasicitySpontaneityPropertiesThrombus Aging +---------+---------------+---------+-----------+----------+--------------+ CFV      Full           Yes      Yes                                 +---------+---------------+---------+-----------+----------+--------------+ SFJ      Full                                                        +---------+---------------+---------+-----------+----------+--------------+ FV Prox  Full                                                        +---------+---------------+---------+-----------+----------+--------------+ FV Mid   Full                                                        +---------+---------------+---------+-----------+----------+--------------+ FV DistalFull                                                        +---------+---------------+---------+-----------+----------+--------------+ PFV      Full                                                        +---------+---------------+---------+-----------+----------+--------------+ POP      Full           Yes      Yes                                 +---------+---------------+---------+-----------+----------+--------------+  PTV      Full                                                        +---------+---------------+---------+-----------+----------+--------------+ PERO     Full                                                        +---------+---------------+---------+-----------+----------+--------------+     Summary: RIGHT: - There is no evidence of deep vein thrombosis in the lower extremity.  - No  cystic structure found in the popliteal fossa. - Ultrasound characteristics of enlarged lymph nodes are noted in the groin.  LEFT: - There is no evidence of deep vein thrombosis in the lower extremity.  - No cystic structure found in the popliteal fossa. - Ultrasound characteristics of enlarged lymph nodes noted in the groin.  *See table(s) above for measurements and observations. Electronically signed by Curt Jews MD on 07/09/2020 at 4:37:48 PM.    Final    US Abdomen Limited RUQ (LIVER/GB)  Result Date: 07/13/2020 CLINICAL DATA:  Concern for cholecystitis. EXAM: ULTRASOUND ABDOMEN LIMITED RIGHT UPPER QUADRANT COMPARISON:  Ultrasound June 27, 2020. CT scan July 12, 2020. FINDINGS: Gallbladder: Tumefactive sludge is seen in the gallbladder. A small amount of pericholecystic fluid is identified. No Murphy's sign is identified. No wall thickening or stones. Common bile duct: Diameter: 4 mm Liver: Diffusely heterogeneous consistent with multiple known lesions. A representative metastatic lesion measures up to 3.2 cm. Portal vein is patent on color Doppler imaging with normal direction of blood flow towards the liver. Other: None. IMPRESSION: 1. There is significant sludge in the gallbladder. There is now a small amount of pericholecystic fluid. However, no Murphy's sign, wall thickening, or stone identified. A HIDA scan could further evaluate if the picture remains clinically ambiguous. 2. Known metastatic disease in the liver, better assessed on CT imaging. Electronically Signed   By: Dorise Bullion III M.D   On: 07/13/2020 18:18   US Abdomen Limited RUQ (LIVER/GB)  Result Date: 06/27/2020 CLINICAL DATA:  Abdominal pain.  Known liver lesions. EXAM: ULTRASOUND ABDOMEN LIMITED RIGHT UPPER QUADRANT COMPARISON:  Noncontrast abdominal CT yesterday. Contrast-enhanced abdominal CT 05/27/2020 FINDINGS: Gallbladder: Physiologically distended containing intraluminal sludge. No shadowing stones. No wall  thickening. No sonographic Murphy sign noted by sonographer. Common bile duct: Diameter: 4-5 mm. Liver: Heterogeneous liver. Many of the liver lesions on prior contrast-enhanced CT are not well seen by ultrasound. There is a 1.8 x 1.5 x 1.5 cm complex hypoechoic lesion in the left lobe. No convincing subcapsular collection is suggested on CT. Portal vein is patent on color Doppler imaging with normal direction of blood flow towards the liver. Other: No right upper quadrant ascites. IMPRESSION: 1. Gallbladder sludge. No gallstones or sonographic findings of acute cholecystitis. 2. No biliary dilatation. 3. Heterogeneous liver with 1.8 cm hypoechoic lesion in the left lobe of the liver. Many of the additional liver lesions on prior contrast-enhanced CT are not well seen by ultrasound. 4. No sonographic evidence of subcapsular collection is suggested on CT. Electronically Signed   By: Keith Rake M.D.   On: 06/27/2020 17:44  ASSESSMENT: 44 yo with   1) Metastatic renal papillary cell carcinoma. Extensive metastases to abdominal pelvic lymph nodes, bones and liver.  Patient with h/o renal transplantation on tacrolimus, prednisone and cellcept chronic immunosuppression with extensive metastatic disease with extensive abd/pelvic LNadenopathy and hepatic metastases.  IHC from LN and liver metastases -- non specific morphology and IC ? Gyn vs renal tubular origin. Tumor markers unrevealing However tissue profiling with cancer type ZW-25% certainty of papillary renal cell carcinoma.;  Patient received 1 cycle of carbotaxol and has been switched to Cabometyx. Component     Latest Ref Rng & Units 05/29/2020 05/31/2020  CEA     0.0 - 4.7 ng/mL 0.9   AFP, Serum, Tumor Marker     0.0 - 8.3 ng/mL 1.7   CA 19-9     0 - 35 U/mL 24   Cancer Antigen (CA) 125     0.0 - 38.1 U/mL 20.1   hCG Quant     mIU/mL 3   LDH     98 - 192 U/L  178    2) severe systemic inflammatory response  syndrome  3) transaminitis and hyperbilirubinemia  4) AKI with hyperkalemia, improved  5) leukocytosis  6) anemia  7) sepsis, resolved  8) acute encephalopathy likely related to multiple antiemetics, resolving   PLAN: -Encephalopathy has resolved.  Likely due to multiple antiemetics.  These medications have been cut back significantly.  Continue monitoring. -We have started the patient on Cabometyx initially at a dose of 20 mg daily which was then increased to 40 mg daily but reduce back down to 20 mg daily due to worsening clinical condition.  This was stopped 11/19 due to sepsis.  Will hold for now pending transfer to [facility]. -Continue control of her nausea. vomiting, abdominal pain.  NG tube placed for tube feedings.  HIDA scan planned for later today due to significant gallbladder sludge. -MRI of the brain has been obtained which was negative for metastatic disease to the brain.  It did show a 1 cm enhancing lesion at the C2 vertebral body compatible with metastatic disease.  Repeat MRI of the brain has been ordered. -We will plan to start the patient on Zometa when stable and out of the hospital for metastatic bone disease. -Her case was discussed with her renal transplant physician Aquilla Hacker at [facility] at [phone].  Discussed to determine options for reducing her immune suppression.  He noted there might be advantages in trying to determine if renal cell cancer is often donor kidney origin versus native kidney to determine potential effectiveness of backing off on immune suppression. -I discussed with the patient that there is an important role of backing off on immune suppression to try to help her body fight of the cancer.  The downside risk would be that she could have renal function loss from rejection and may land up on dialysis. -Dr Altamease Oiler will be discussing with his pathologist at [facility] to know what testing to send out to determine donor versus native kidney origin of her renal  cell carcinoma. -Tacrolimus level resulted at 10.4.  Hospitalist contacted nephrology at [facility] who wishes to continue CellCept for now.  However, due to sepsis, tacrolimus and mycophenolate were placed on hold. -Oncology will continue to follow -Transfuse as needed for hemoglobin less than 8.    Kristin Curcio DNP  LOS: 19 days     ADDENDUM  .Patient was Personally and independently interviewed, examined and relevant elements of the history of present  illness were reviewed in details and an assessment and plan was created. All elements of the patient's history of present illness , assessment and plan were discussed in details with Mikey Bussing DNP. The above documentation reflects our combined findings assessment and plan.  Sullivan Lone MD MS

## 2020-07-15 NOTE — Hospital Course (Signed)
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  Recent dx (out-pt)  Metastatic carcinoma of unknown primary. Dx'd oct.   -admitted on 11/3 with abdominal pain and intractable nausea and vomiting.  She has had persistent difficulty tolerating p.o. intake, requiring postpyloric enteral feedings.  She has developed encephalopathy of unknown etiology.Gyn vs renal tubular origin  -started chemo 10/21: carboplatin Taxol chemotherapy -10/29: changed to Carbometyx sent for authorization  discussed cancer type ID shows this tumor is papillary renal cell carcinoma with 08% certainly  02/2 Admitted for intractable N/V, abd pain also w/ new renal failure. Placed on broad spec abx   11/4 seen by onc. Rx'ing pain. Abx continued. Holding chemo due to LFT elevated.  11/8 abx stopped, back on chemo. -Continue on Cabometyx at a reduced dose of 20 mg daily.  Recommend checking daily hepatic function. -LFTs trending downward.  We will plan to increase Cabometyx to 40   discussed with her renal transplant physician Aquilla Hacker at [facility] at [phone].  Discussed to determine options for reducing her immune suppression.  He noted there might be advantages in trying to determine if renal cell cancer is often donor kidney origin versus native kidney to determine potential effectiveness of backing off on immune suppression.  11/11: Her case was discussed with her renal transplant physician Aquilla Hacker at [facility] at [phone].  Discussed to determine options for reducing her immune suppression.  He noted there might be advantages in trying to determine if renal cell cancer is often donor kidney origin versus native kidney to determine potential effectiveness of backing off on immune suppression. -I discussed with the patient that there is an important role of backing off on immune suppression to try to help her body  fight of the cancer.  The downside risk would be that she could have renal function loss from rejection and may land up on dialysis. -Dr Altamease Oiler will be discussing with his pathologist at [facility] to know what testing to send out to determine donor versus native kidney origin of her renal cell carcinoma. 11/17 confusion noted.  11/18 encephalopathy worse/persistent .  more encephalopathic and somnolent today than she was yesterday.  Neurology evaluated and recommending treating with IV thiamine.  Medical oncology now holding her chemotherapy given her worsening condition.  NG tube was able to be placed today and she has been started on tube feedings 11/20 PCCM consulted. Shock state. Immunosuppression stopped. Stress dose steroids placed. Broad spec abx started 11/21 off pressors 11/22 delirium resolved.

## 2020-07-15 NOTE — Progress Notes (Signed)
PT Cancellation Note  Patient Details Name: Candace Griffin MRN: [id number] DOB: 1976-08-12   Cancelled Treatment:     cancel today after chart review and confirming with RN pt unable to tolerate.  Will continue to follow during her Acute stay.    Rica Koyanagi  PTA Acute  Rehabilitation Services Pager      [phone] Office      [phone]

## 2020-07-15 NOTE — Progress Notes (Signed)
[facility] Progress Note Patient Name: Candace Griffin DOB: 04-May-1976 MRN: [id number]   Date of Service  07/15/2020  HPI/Events of Note  Frequent loose stools - Nursing request for Flexiseal.   eICU Interventions  Plan: 1. Place Flexiseal.     Intervention Category Major Interventions: Other:  Lysle Dingwall 07/15/2020, 11:58 PM

## 2020-07-15 NOTE — Progress Notes (Signed)
PHARMACY NOTE:  ANTIMICROBIAL RENAL DOSAGE ADJUSTMENT  Current antimicrobial regimen includes a mismatch between antimicrobial dosage and estimated renal function.  As per policy approved by the Pharmacy & Therapeutics and Medical Executive Committees, the antimicrobial dosage will be adjusted accordingly.  Current antimicrobial dosage:  Meropenem 2 gm IV q 8 hours   Indication: Sepsis - Possible intra-abdominal source   Renal Function:  Estimated Creatinine Clearance: 58.7 mL/min (A) (by C-G formula based on SCr of 1.33 mg/dL (H)). []      On intermittent HD, scheduled: []      On CRRT    Antimicrobial dosage has been changed to:  Meropenem 1 gm IV Q 8 hours  Additional comments: Lowering dose as not targeting a CNS source    Thank you for allowing pharmacy to be a part of this patient's care.  Jimmy Footman, PharmD, BCPS, [facility] Clinical Pharmacist Phone: [phone] 07/15/2020 3:19 PM

## 2020-07-15 NOTE — Plan of Care (Signed)
HIDA scan negative for acute cholecystitis. Patient is requesting regular diet. No nausea and she is much more awake than previous days. Will start regular diet and monitor nausea. Con't TF for now. Will need calorie counts before TF discontinued.  Julian Hy, DO 07/15/20 5:53 PM [facility]

## 2020-07-15 NOTE — TOC Initial Note (Signed)
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  She was recently diagnosed with metastatic (bone, liver) renal cell carcinoma of unknown primary.  She was admitted on 11/3 with abdominal pain and intractable nausea and vomiting.  She has had persistent difficulty tolerating p.o. intake, requiring postpyloric enteral feedings.  She has developed encephalopathy of unknown etiology.  Neurology has been consulted and has been providing supplemental thiamine.  ID was consulted, but no source of infection has been identified.  Antibiotics stopped on 11/19.  She has had an AKI throughout the admission with peak creatinine 1.42.  This is improved with volume resuscitation.  Overnight she developed hypotension, partially responsive to fluids.  She has remained on her immunosuppressive medications throughout her admission at the direction of her transplant physicians at [facility].  She was accepted for transfer to [facility] on 11/19, but beds have so far not been available.  Past Medical History  ESRD Renal transplant x 2, most recent 2019 at [place] planm is to return to home with hhc through [facility] has before Following for progression  Expected Discharge Plan: [place] Barriers to Discharge: No Barriers Identified   Patient Goals and CMS Choice Patient states their goals for this hospitalization and ongoing recovery are:: to go home CMS Medicare.gov Compare Post Acute Care list provided to:: Patient    Expected Discharge Plan and Services Expected Discharge Plan: [place]    Discharge Planning Services: CM Consult Post Acute Care Choice: [place] arrangements for the past 2 months: Single Family Home                 DME Arranged: [place] rolling DME Agency: [employer] Date DME Agency Contacted: 07/09/20 Time DME Agency Contacted: 1054 Representative spoke with at DME Agency: referral line [place] Arranged: OT [place] Agency: [place] ([place]) Date [place]: 07/15/20 Time [place]: [phone] Representative spoke with at [place]: Ramond Marrow  Prior Living Arrangements/Services Living arrangements for the past 2 months: [place] with:: Self Patient language and need for interpreter reviewed:: Yes Do you feel safe going back to the place where you live?: Yes      Need for Family Participation in Patient Care: Yes (Comment) Care giver support system in place?: Yes (comment) Current home services: Home OT Criminal Activity/Legal Involvement Pertinent to Current Situation/Hospitalization: No - Comment as needed  Activities of Daily Living Home Assistive Devices/Equipment: Eyeglasses ADL Screening (condition at time of admission) Patient's cognitive ability adequate to safely complete daily activities?: Yes Is the patient deaf or have difficulty hearing?: No Does the patient have difficulty seeing, even when wearing glasses/contacts?: No Does the patient have difficulty concentrating, remembering, or making decisions?: No Patient able to express need for assistance with ADLs?: Yes Does the patient have difficulty dressing or bathing?: No Independently performs ADLs?: Yes (appropriate for developmental age) Does the patient have difficulty walking or climbing stairs?: No Weakness of Legs: None Weakness of Arms/Hands: None  Permission Sought/Granted Permission sought to  share information with : Case Manager Permission granted to share information with : Yes, Verbal Permission Granted  Share Information with  NAME: Case manager           Emotional Assessment Appearance:: Appears stated age Attitude/Demeanor/Rapport: Engaged Affect (typically observed): Calm Orientation: : Oriented to Situation, Oriented to  Time, Oriented to Place, Oriented to Self Alcohol / Substance Use: Not Applicable Psych Involvement: No (comment)  Admission diagnosis:  Generalized abdominal pain [R10.84] Sepsis ([place]) [A41.9] Severe sepsis ([place]) [A41.9, R65.20] Non-intractable vomiting with nausea, unspecified vomiting type [R11.2] Metastatic malignant neoplasm, unspecified site [facility]) [C79.9] Patient Active Problem List   Diagnosis Date Noted  . Abdominal pain   . Dyspnea   . Poor fluid intake   . Malnutrition of moderate degree 07/11/2020  . Non-intractable vomiting   . Generalized abdominal pain   . Renal cell carcinoma ([place])   . Hepatitis 06/27/2020  . Sepsis ([place]) 06/26/2020  . AKI (acute kidney injury) ([place]) 06/26/2020  . Pancreatitis 06/26/2020  . 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 with unknown primary site ([place]) 06/10/2020  . Counseling regarding advance care planning and goals of care 06/10/2020  . Metastatic carcinoma to liver ([place])   . Renal mass   . Lymphadenopathy   . Anemia   . Feeling unwell 03/28/2020  . Constipation 02/21/2020  . Left arm pain 11/16/2019  . Symptomatic anemia 12/03/2017  . Low grade squamous intraepithelial lesion (LGSIL) on cervical Pap smear 05/03/2014  . Genital warts 07/31/2013  . Hypertension 04/21/2012  . Status post kidney transplant 01/07/2012  . Menorrhagia 09/30/2011  . GERD 10/10/2009  . GENITAL HERPES, HX OF 08/11/2007   PCP:  Richarda Osmond, DO Pharmacy:   [id number] - [place], [place] [address] [place] [address] Phone: [phone] Fax: [phone]     Social Determinants of Health (SDOH) Interventions    Readmission Risk  Interventions Readmission Risk Prevention Plan 07/01/2020  Transportation Screening Complete  Medication Review ([place]) Complete  HRI or [place] Complete  SW Recovery Care/Counseling Consult Complete  Palliative Care Screening Not [place] Not Applicable  Some recent data might be hidden

## 2020-07-16 DIAGNOSIS — K859 Acute pancreatitis without necrosis or infection, unspecified: Secondary | ICD-10-CM | POA: Diagnosis not present

## 2020-07-16 DIAGNOSIS — R6520 Severe sepsis without septic shock: Secondary | ICD-10-CM

## 2020-07-16 LAB — TYPE AND SCREEN
ABO/RH(D): O POS
Antibody Screen: POSITIVE
Donor AG Type: NEGATIVE
Unit division: 0
Unit division: 0

## 2020-07-16 LAB — COMPREHENSIVE METABOLIC PANEL
ALT: 62 U/L — ABNORMAL HIGH (ref 0–44)
AST: 181 U/L — ABNORMAL HIGH (ref 15–41)
Albumin: 1.8 g/dL — ABNORMAL LOW (ref 3.5–5.0)
Alkaline Phosphatase: 650 U/L — ABNORMAL HIGH (ref 38–126)
Anion gap: 10 (ref 5–15)
BUN: 39 mg/dL — ABNORMAL HIGH (ref 6–20)
CO2: 20 mmol/L — ABNORMAL LOW (ref 22–32)
Calcium: 8 mg/dL — ABNORMAL LOW (ref 8.9–10.3)
Chloride: 114 mmol/L — ABNORMAL HIGH (ref 98–111)
Creatinine, Ser: 1.24 mg/dL — ABNORMAL HIGH (ref 0.44–1.00)
GFR, Estimated: 55 mL/min — ABNORMAL LOW (ref >60)
Glucose, Bld: 170 mg/dL — ABNORMAL HIGH (ref 70–99)
Potassium: 3.1 mmol/L — ABNORMAL LOW (ref 3.5–5.1)
Sodium: 144 mmol/L (ref 135–145)
Total Bilirubin: 6.7 mg/dL — ABNORMAL HIGH (ref 0.3–1.2)
Total Protein: 5.6 g/dL — ABNORMAL LOW (ref 6.5–8.1)

## 2020-07-16 LAB — GLUCOSE, CAPILLARY
Glucose-Capillary: 123 mg/dL — ABNORMAL HIGH (ref 70–99)
Glucose-Capillary: 127 mg/dL — ABNORMAL HIGH (ref 70–99)
Glucose-Capillary: 135 mg/dL — ABNORMAL HIGH (ref 70–99)
Glucose-Capillary: 144 mg/dL — ABNORMAL HIGH (ref 70–99)
Glucose-Capillary: 147 mg/dL — ABNORMAL HIGH (ref 70–99)

## 2020-07-16 LAB — CBC
HCT: 26.2 % — ABNORMAL LOW (ref 36.0–46.0)
Hemoglobin: 8.3 g/dL — ABNORMAL LOW (ref 12.0–15.0)
MCH: 26.7 pg (ref 26.0–34.0)
MCHC: 31.7 g/dL (ref 30.0–36.0)
MCV: 84.2 fL (ref 80.0–100.0)
Platelets: 213 10*3/uL (ref 150–400)
RBC: 3.11 MIL/uL — ABNORMAL LOW (ref 3.87–5.11)
RDW: 25 % — ABNORMAL HIGH (ref 11.5–15.5)
WBC: 27.6 10*3/uL — ABNORMAL HIGH (ref 4.0–10.5)
nRBC: 0.1 % (ref 0.0–0.2)

## 2020-07-16 LAB — BPAM RBC
Blood Product Expiration Date: 202112082359
Blood Product Expiration Date: 202112282359
ISSUE DATE / TIME: 202111191813
Unit Type and Rh: 5100
Unit Type and Rh: 5100

## 2020-07-16 LAB — RESP PANEL BY RT-PCR (FLU A&B, COVID) ARPGX2
Influenza A by PCR: NEGATIVE
Influenza B by PCR: NEGATIVE
SARS Coronavirus 2 by RT PCR: NEGATIVE

## 2020-07-16 LAB — CULTURE, BLOOD (ROUTINE X 2)
Culture: NO GROWTH
Culture: NO GROWTH

## 2020-07-16 LAB — MAGNESIUM: Magnesium: 1.8 mg/dL (ref 1.7–2.4)

## 2020-07-16 MED ORDER — SODIUM CHLORIDE 0.9 % IV SOLN: 1.0000 g | Freq: Three times a day (TID) | INTRAVENOUS | Status: AC

## 2020-07-16 MED ORDER — PANTOPRAZOLE SODIUM 40 MG PO PACK: 40.0000 mg | PACK | Freq: Two times a day (BID) | ORAL | Status: AC

## 2020-07-16 MED ORDER — METOPROLOL TARTRATE 25 MG PO TABS
12.5000 mg | ORAL_TABLET | Freq: Two times a day (BID) | ORAL | Status: DC
Start: 2020-07-16 — End: 2020-07-16

## 2020-07-16 MED ORDER — HYDROCORTISONE NA SUCCINATE PF 100 MG IJ SOLR: 100.0000 mg | Freq: Three times a day (TID) | INTRAMUSCULAR | Status: DC

## 2020-07-16 MED ORDER — SCOPOLAMINE 1 MG/3DAYS TD PT72: 1.0000 | MEDICATED_PATCH | TRANSDERMAL | 12 refills | Status: AC

## 2020-07-16 MED ORDER — POTASSIUM CHLORIDE CRYS ER 20 MEQ PO TBCR
40.0000 meq | EXTENDED_RELEASE_TABLET | ORAL | Status: AC
  Filled 2020-07-16: qty 2

## 2020-07-16 NOTE — Evaluation (Signed)
Clinical/Bedside Swallow Evaluation Patient Details  Name: Candace Griffin MRN: [id number] Date of Birth: 09-17-1975  Today's Date: 07/16/2020 Time: SLP Start Time (ACUTE ONLY): 1141 SLP Stop Time (ACUTE ONLY): 1229 SLP Time Calculation (min) (ACUTE ONLY): 48 min  Past Medical History:  Past Medical History:  Diagnosis Date  . Chronic glomerulonephritis   . Dialysis patient ([place])   . End stage renal disease ([place])   . GERD (gastroesophageal reflux disease)   . History of renal transplant 20-Oct-2011  . Hypertension    Past Surgical History:  Past Surgical History:  Procedure Laterality Date  . COLPOSCOPY  2016  . diaylsis shunt Right    arm  . KIDNEY TRANSPLANT  20-Oct-2011   deceased donor kidney   HPI:  44 yo female adm to [facility] 06/26/2020 with pancreatitis, AMS, encephalopathy, intractable N/V.  Required pressors, NG tube placement - which pt removed.   PMH + for 2 renal transplants (last 2019) with metastatic renal carcinoma, unknown primary - mets to T12, L1, L2.  Pt with encephalopathy with normal MRI.  Pt CXR showed slight interval improvement of bilateral perihilar opacity Left more than Right.   Swallow evaluation ordered on 07/16/2020.   Assessment / Plan / Recommendation Clinical Impression  Pt presents with overall functional oropharyngeal swallow ability. No clinical indications of airway compromise with all intake and CN exam is unremarkable.  Pt observed consuming trials of cracker, pudding, water and Fanta soda.  Minimal delay in last swallow of liquid bolus noted and oral pocketing of solids on left lateral sulci to which pt was aware - stating occuring since her illness started 1.5 months ago.    Most notably, with HOB lowering, pt began tapping her mid=sternum region admitting to concern for reflux, h/o GERD. She also indicated sensation of liquids "sticking and stinging" with consumption concerning for primary esophageal deficits.  Given pt's N/V prior to admission, SLP  questions if esophageal agitation present as a result.     Advised pt to consume warm liquid as she reports this lessens symptoms.  Also recommended she eat small frequent meals and follow up with GI as OP if symptoms are not mitigated with medical improvement.  Pt's protocol for taking po medications at home includes - taking omeprazole- eating meal, then taking other medications AFTER eating.  She states this helps to keep her from vomiting her pills.  Relayed med administration to RN.  No SLP follow up indicated as pt educated to compensation strategies to decrease dyspahgia and asp risk.  Thanks for this referral. SLP Visit Diagnosis: Dysphagia, unspecified (R13.10)    Aspiration Risk  Mild aspiration risk    Diet Recommendation Regular;Thin liquid   Liquid Administration via: Cup;Straw Medication Administration: Whole meds with liquid Supervision: Patient able to self feed Compensations: Slow rate;Small sips/bites Postural Changes: Seated upright at 90 degrees;Remain upright for at least 30 minutes after po intake    Other  Recommendations Oral Care Recommendations: Oral care BID   Follow up Recommendations None      Frequency and Duration   n/a         Prognosis   n/a     Swallow Study   General Date of Onset: 07/16/20 HPI: 44 yo female adm to [facility] 06/26/2020 with pancreatitis, AMS, encephalopathy, intractable N/V.  Required pressors, NG tube placement - which pt removed.   PMH + for 2 renal transplants (last 2019) with metastatic renal carcinoma, unknown primary - mets to T12, L1, L2.  Pt  with encephalopathy with normal MRI.  Pt CXR showed slight interval improvement of bilateral perihilar opacity Left more than Right.   Swallow evaluation ordered on 07/16/2020. Diet Prior to this Study: Regular;Thin liquids (but pt had not consumed meals) Temperature Spikes Noted: No Respiratory Status: Room air History of Recent Intubation: No Behavior/Cognition:  Alert;Cooperative;Pleasant mood (required encouragement to participate) Oral Care Completed by SLP: No Oral Cavity - Dentition: Adequate natural dentition Vision: Functional for self-feeding Self-Feeding Abilities: Able to feed self Patient Positioning: Upright in bed Baseline Vocal Quality: Low vocal intensity Volitional Cough: Weak Volitional Swallow: Able to elicit (after oral moisture, pt able to ilicit)    Oral/Motor/Sensory Function Overall Oral Motor/Sensory Function: Generalized oral weakness   Ice Chips Ice chips: Not tested   Thin Liquid Thin Liquid: Within functional limits (minimal delayed last swallow of single boluses noted but no indication of aspiration)    Nectar Thick Nectar Thick Liquid: Not tested   Honey Thick Honey Thick Liquid: Not tested   Puree Puree: Within functional limits Presentation: Self Fed;Spoon   Solid     Solid: Impaired Presentation: Self Fed Oral Phase Functional Implications: Left lateral sulci pocketing Other Comments: Pt has oral retention posterior left lateral sulci of which she is aware stating "I don't know why I do that" - indicating this has been ongoing since her illness approx 1.5 months ago      Macario Golds 07/16/2020,12:56 PM Kathleen Lime, MS [place] Office [phone] Pager [phone]

## 2020-07-16 NOTE — Progress Notes (Signed)
RN entered room to find that patient pulled dobhoff tube by accident, respiratory assessment performed to ensure clear lung sounds, Patient was progressed to regular diet on 07/15/20.  E-link notified.

## 2020-07-16 NOTE — Discharge Summary (Signed)
Physician Discharge Summary  BONNETTA ALLBEE FUX:[id number] DOB: 10-22-1975 DOA: 06/26/2020  PCP: Richarda Osmond, DO  Admit date: 06/26/2020 Discharge date: 07/16/2020  Admitted From: Home Disposition:  [facility] to [facility]  Discharge Condition:Stable CODE STATUS:FULL, Diet recommendation:  Regular  Brief/Interim Summary:   Mrs. 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 in 2019.  She was recently diagnosed with metastatic (bone, liver) renal cell carcinoma of unknown primary.  She was admitted on 11/3 with abdominal pain and intractable nausea and vomiting.  She has had persistent difficulty tolerating p.o. intake, requiring postpyloric enteral feedings.  She has developed encephalopathy of unknown etiology but now alert and oriented and now tolerating oral diet.  ID was consulted, but no source of infection has been identified.   She has had an AKI throughout the admission with peak creatinine 1.42.  This is improved with volume resuscitation.She has remained on her immunosuppressive medications throughout her admission at the direction of her transplant physicians at [facility].  She was accepted for transfer to [facility] .Marland Kitchen She finally has a bed available at [facility] today and she will be transferred today.  Her mental status has also improved.  Following problems were addressed during hospitalization:   Sepsis due to unknown source.  Shock resolved.  PCT downtrending on treatment. -Blood cultures so far negative.  HIDA scan  Ruled out cholecystitis.  Continue meropenem; if no known source would plan for 7 day course total. -Continue to follow cultures to completion. -Holding tacrolimus and mycophenolate for now.  AKI; history of end-stage renal disease, renal transplant x2. -Given fluids -Continue to monitor -Renally dose meds and avoid nephrotoxic meds -Strict  I's/O  Hyperglycemia -Improved now -We  decreased steroids and discussion with [facility] transplant team.  Holding tacrolimus and mycophenolate for now..  Metastatic RCC, unknown primary. Had difficulty tolerating chemo as an outpatient for her first regimen. Chronic pain due to mets -Continue pain medications -Oncology was following here  Intractable nausea and vomiting- better controlled and tolerating diet now -Continue supportive care  Acute encephalopathy MRI of the brain without any acute findings.  Currently alert and oriented.  Discharge Diagnoses:  Principal Problem:   Pancreatitis Active Problems:   GERD   Renal transplant recipient   Hypertension   Symptomatic anemia   Metastatic carcinoma to liver (HCC)   Severe sepsis (HCC)   AKI (acute kidney injury) ([place])   Hepatitis   Generalized abdominal pain   Renal cell carcinoma (HCC)   Nausea and vomiting   Malnutrition of moderate degree   Abdominal pain   Dyspnea   Poor fluid intake   Metastatic malignant neoplasm [facility])    Discharge Instructions   Allergies as of 07/16/2020      Reactions   Lisinopril Cough   Chlorhexidine Rash   Tape Rash, Other (See Comments)   Causes welts also      Medication List    STOP taking these medications   dexamethasone 4 MG tablet Commonly known as: DECADRON   fluconazole 100 MG tablet Commonly known as: DIFLUCAN   lidocaine-prilocaine cream Commonly known as: EMLA   omeprazole 20 MG capsule Commonly known as: PRILOSEC   pravastatin 20 MG tablet Commonly known as: PRAVACHOL   prochlorperazine 10 MG tablet Commonly known as: COMPAZINE   senna 8.6 MG Tabs tablet Commonly known as: SENOKOT     TAKE these medications  Envarsus XR 1 MG Tb24 Generic drug: Tacrolimus ER Take 4 mg by mouth daily.   LORazepam 0.5 MG tablet Commonly known as: ATIVAN Take 1 tablet (0.5 mg total) by mouth every 8 (eight) hours as needed for anxiety. What changed: Another  medication with the same name was removed. Continue taking this medication, and follow the directions you see here.   meropenem 1 g in sodium chloride 0.9 % 100 mL Inject 1 g into the vein every 8 (eight) hours.   mycophenolate 250 MG capsule Commonly known as: CELLCEPT Take 500 mg by mouth 2 (two) times daily.   ondansetron 8 MG tablet Commonly known as: Zofran Take 1 tablet (8 mg total) by mouth every 8 (eight) hours as needed for nausea or vomiting. What changed: Another medication with the same name was removed. Continue taking this medication, and follow the directions you see here.   oxyCODONE 5 MG immediate release tablet Commonly known as: Roxicodone Take 2 tablets (10 mg total) by mouth every 4 (four) hours as needed for moderate pain or severe pain.   pantoprazole sodium 40 mg/20 mL Pack Commonly known as: PROTONIX Place 20 mLs (40 mg total) into feeding tube 2 (two) times daily.   predniSONE 5 MG tablet Commonly known as: DELTASONE Take 5 mg by mouth daily with breakfast.   scopolamine 1 MG/3DAYS Commonly known as: TRANSDERM-SCOP Place 1 patch (1.5 mg total) onto the skin every 3 (three) days.            Durable Medical Equipment  (From admission, onward)         Start     Ordered   07/09/20 1042  For home use only DME Tub bench  Once        07/09/20 1041   07/08/20 1053  For home use only DME Walker rolling  Once       Question Answer Comment  Walker: With [place] Wheels   Patient needs a walker to treat with the following condition Physical deconditioning      07/08/20 [place] Follow up.   Why: [facility]occupational therapy Contact information: [place] [address] [phone]        [facility] Follow up.   Why: rolling walker;tub bench Contact information: [address] [place] [address] [phone]               Allergies  Allergen Reactions  . Lisinopril Cough  . Chlorhexidine Rash  . Tape Rash and Other (See Comments)    Causes welts also    Consultations:  PCCM, oncology, ID   Procedures/Studies: CT ABDOMEN PELVIS WO CONTRAST  Result Date: 07/12/2020 CLINICAL DATA:  44 year old female with nausea and vomiting. Metastatic cancer of unknown primary. EXAM: CT CHEST, ABDOMEN AND PELVIS WITHOUT CONTRAST TECHNIQUE: Multidetector CT imaging of the chest, abdomen and pelvis was performed following the standard protocol without IV contrast. COMPARISON:  CT of the chest abdomen pelvis dated 07/01/2020. FINDINGS: CT CHEST FINDINGS Evaluation of this exam is limited in the absence of intravenous contrast. Cardiovascular: There is no cardiomegaly or pericardial effusion. There is mild atherosclerotic calcification of the aortic arch. The central pulmonary arteries are grossly unremarkable on this noncontrast CT. Mediastinum/Nodes: No definite hilar or mediastinal adenopathy. Evaluation however is limited in the absence of intravenous contrast as well as due to consolidative  changes of the lungs. A feeding tube noted within the esophagus. The thyroid gland is grossly unremarkable. No mediastinal fluid collection. Lungs/Pleura: Small bilateral pleural effusions. There are large areas of consolidation involving the lower lobes as well as smaller areas of patchy ground-glass density involving the right middle lobe, right upper lobe, and lingula most consistent with multifocal pneumonia. Aspiration is not excluded clinical correlation is recommended. There is no pneumothorax. The central airways are patent. Musculoskeletal: No chest wall mass or suspicious bone lesions identified. CT ABDOMEN PELVIS FINDINGS No intra-abdominal free air. Small perihepatic free fluid. Hepatobiliary: Several faint hepatic hypodense lesions consistent with metastatic disease. These were better seen on the contrast enhanced CT of  07/01/2020. no intrahepatic biliary dilatation. High attenuating content within the gallbladder may represent sludge or vicarious excretion of recently administered contrast. Pancreas: The pancreas is grossly unremarkable. Spleen: Normal in size without focal abnormality. Adrenals/Urinary Tract: The adrenal glands are suboptimally visualized. Severe bilateral renal atrophy. There is a left lower quadrant renal transplant. There is no hydronephrosis or nephrolithiasis of the transplant kidney. No peritransplant fluid collection. There is duplicated appearance of the renal transplant collecting system. The urinary bladder is unremarkable. Stomach/Bowel: A feeding tube with tip in the distal duodenum. There is no bowel obstruction. The appendix is normal. Vascular/Lymphatic: Moderate aortoiliac atherosclerotic disease. No portal venous gas. Large and bulky retroperitoneal adenopathy as well as bilateral inguinal adenopathy. Large adenopathy along the left iliac chain. Reproductive: The uterus is grossly unremarkable. Other: There is a 3.8 x 2.7 cm low attenuating collection with areas of calcification along the right pelvic sidewall similar to prior CT. This is not well characterized but may related to an old hematoma or necrotic collection. Musculoskeletal: There is diffuse subcutaneous edema and anasarca, new or worsened since the prior CT. Sclerotic osseous metastatic disease predominantly involving T12, L1, L2 as seen previously. Sclerotic metastatic involvement of the right pubic bone. No acute osseous pathology. IMPRESSION: 1. Multifocal pneumonia. Aspiration is not excluded. Clinical correlation is recommended. 2. Small bilateral pleural effusions, new or worsened since the prior CT. 3. Extensive retroperitoneal nodal metastatic disease as well as hepatic and osseous metastatic disease as seen previously. The liver lesions are better seen on the prior contrast enhanced CT. 4. Atrophic native kidneys with a Left  lower quadrant renal transplant. No hydronephrosis or nephrolithiasis. 5. Diffuse subcutaneous edema and anasarca, new or worsened since the prior CT. 6. Aortic Atherosclerosis (ICD10-I70.0). Electronically Signed   By: Anner Crete M.D.   On: 07/12/2020 17:07   CT ABDOMEN PELVIS WO CONTRAST  Result Date: 06/26/2020 CLINICAL DATA:  Diffuse abdominal pain and vomiting. History of cancer and renal transplant. Liver mass post recent liver biopsy. EXAM: CT ABDOMEN AND PELVIS WITHOUT CONTRAST TECHNIQUE: Multidetector CT imaging of the abdomen and pelvis was performed following the standard protocol without IV contrast. COMPARISON:  Contrast-enhanced exam 05/27/2020. FINDINGS: Lower chest: No focal airspace disease, pleural fluid, or pulmonary nodularity. Heart is normal in size. Hepatobiliary: Patient's known hepatic lesions are not well demonstrated on noncontrast exam. Liver parenchyma is heterogeneous. There may be a small subcapsular hematoma inferiorly from recent liver biopsy, not well-defined on this noncontrast exam. The gallbladder is present. There is high-density material in the gallbladder lumen, possibly sludge. No calcified gallstone. Common bile duct is poorly defined, but no evidence of biliary dilatation. Pancreas: No ductal dilatation or inflammation. Spleen: Normal in size without focal abnormality. Adrenals/Urinary Tract: Normal adrenal glands. Chronic bilateral native renal atrophy. Solid-appearing lesion in  the lower left renal hilum was better appreciated on prior contrast-enhanced CT, grossly unchanged, series 2, image 35. Transplant kidney in the left lower quadrant. Scarring in the left lower pole is better appreciated on prior contrast-enhanced exam. Similar fullness of the renal collecting system without frank hydronephrosis. No transplant perinephric edema. Sequela of failed transplant in the right iliac fossa. The urinary bladder is unremarkable. Stomach/Bowel: Bowel evaluation is  limited in the absence of enteric contrast. There is no bowel obstruction or evidence of bowel inflammation. The appendix is normal. Vascular/Lymphatic: Bulky retroperitoneal adenopathy, some of which is low-density and typical of necrosis. Occasional areas of nodal calcification. Adenopathy extends from the retrocrural space to the bilateral common iliac arteries, and left external iliac station. There also enlarged bilateral inguinal lymph nodes, left greater than right. Index aortocaval node measures 4.1 x 2.4 cm, previously 4.2 x 2.6 cm. Overall adenopathy is not significantly changed in the interim allowing for noncontrast technique. There is aortic and branch atherosclerosis. Reproductive: Bulky uterus with fibroids.  No obvious adnexal mass. Other: There is trace free fluid in the pelvis, slightly increased from prior exam. No other free fluid or ascites. No free air. Musculoskeletal: Ill-defined sclerosis involving right L2 vertebral body, nonspecific but unchanged from previous. Sclerotic density in the left acetabulum. There is hemi transitional lumbosacral anatomy. No fracture or acute osseous abnormality. IMPRESSION: 1. Patient's known hepatic lesions are not well-defined on this noncontrast exam. There may be a small subcapsular hematoma inferiorly from recent liver biopsy, not well-defined. No evidence of hemoperitoneum. 2. Bulky retroperitoneal and bilateral inguinal adenopathy, grossly unchanged from prior. 3. Transplant kidney in the left lower quadrant with unchanged fullness of the renal collecting system. Sequela of failed transplant in the right iliac fossa. Stable soft tissue density in the region of the native left kidney lower hilum. 4. Bulky uterus with fibroids. 5. Nonspecific sclerosis involving the right aspect of L2 vertebral body. Aortic Atherosclerosis (ICD10-I70.0). Electronically Signed   By: Keith Rake M.D.   On: 06/26/2020 16:45   CT HEAD WO CONTRAST  Result Date:  07/10/2020 CLINICAL DATA:  Delirium, history of urologic malignancy. EXAM: CT HEAD WITHOUT CONTRAST TECHNIQUE: Contiguous axial images were obtained from the base of the skull through the vertex without intravenous contrast. COMPARISON:  MRI 06/28/2020 FINDINGS: Brain: No evidence of acute infarction, hemorrhage, hydrocephalus, extra-axial collection, mass effect or visible mass lesion 4 within the limitations of an unenhanced CT exam. Basal cisterns are patent. Midline intracranial structures are normal. Cerebellar tonsils are normally positioned. Vascular: No hyperdense vessel or unexpected calcification. Skull: Few prominent venous lakes. No convincing focal sclerotic or lytic osseous metastatic disease. A previously seen C2 vertebral body metastasis is not within the margins of imaging. Scalp soft tissues are unremarkable. Sinuses/Orbits: Paranasal sinuses and mastoid air cells are predominantly clear. Included orbital structures are unremarkable. Other: None IMPRESSION: 1. No acute intracranial findings. No visible metastatic lesions within the limitations of unenhanced CT imaging. 2. A previously seen C2 vertebral body metastasis is not within the margins of imaging. Electronically Signed   By: Lovena Le M.D.   On: 07/10/2020 19:59   CT CHEST WO CONTRAST  Result Date: 07/12/2020 CLINICAL DATA:  44 year old female with nausea and vomiting. Metastatic cancer of unknown primary. EXAM: CT CHEST, ABDOMEN AND PELVIS WITHOUT CONTRAST TECHNIQUE: Multidetector CT imaging of the chest, abdomen and pelvis was performed following the standard protocol without IV contrast. COMPARISON:  CT of the chest abdomen pelvis dated 07/01/2020. FINDINGS: CT  CHEST FINDINGS Evaluation of this exam is limited in the absence of intravenous contrast. Cardiovascular: There is no cardiomegaly or pericardial effusion. There is mild atherosclerotic calcification of the aortic arch. The central pulmonary arteries are grossly  unremarkable on this noncontrast CT. Mediastinum/Nodes: No definite hilar or mediastinal adenopathy. Evaluation however is limited in the absence of intravenous contrast as well as due to consolidative changes of the lungs. A feeding tube noted within the esophagus. The thyroid gland is grossly unremarkable. No mediastinal fluid collection. Lungs/Pleura: Small bilateral pleural effusions. There are large areas of consolidation involving the lower lobes as well as smaller areas of patchy ground-glass density involving the right middle lobe, right upper lobe, and lingula most consistent with multifocal pneumonia. Aspiration is not excluded clinical correlation is recommended. There is no pneumothorax. The central airways are patent. Musculoskeletal: No chest wall mass or suspicious bone lesions identified. CT ABDOMEN PELVIS FINDINGS No intra-abdominal free air. Small perihepatic free fluid. Hepatobiliary: Several faint hepatic hypodense lesions consistent with metastatic disease. These were better seen on the contrast enhanced CT of 07/01/2020. no intrahepatic biliary dilatation. High attenuating content within the gallbladder may represent sludge or vicarious excretion of recently administered contrast. Pancreas: The pancreas is grossly unremarkable. Spleen: Normal in size without focal abnormality. Adrenals/Urinary Tract: The adrenal glands are suboptimally visualized. Severe bilateral renal atrophy. There is a left lower quadrant renal transplant. There is no hydronephrosis or nephrolithiasis of the transplant kidney. No peritransplant fluid collection. There is duplicated appearance of the renal transplant collecting system. The urinary bladder is unremarkable. Stomach/Bowel: A feeding tube with tip in the distal duodenum. There is no bowel obstruction. The appendix is normal. Vascular/Lymphatic: Moderate aortoiliac atherosclerotic disease. No portal venous gas. Large and bulky retroperitoneal adenopathy as well  as bilateral inguinal adenopathy. Large adenopathy along the left iliac chain. Reproductive: The uterus is grossly unremarkable. Other: There is a 3.8 x 2.7 cm low attenuating collection with areas of calcification along the right pelvic sidewall similar to prior CT. This is not well characterized but may related to an old hematoma or necrotic collection. Musculoskeletal: There is diffuse subcutaneous edema and anasarca, new or worsened since the prior CT. Sclerotic osseous metastatic disease predominantly involving T12, L1, L2 as seen previously. Sclerotic metastatic involvement of the right pubic bone. No acute osseous pathology. IMPRESSION: 1. Multifocal pneumonia. Aspiration is not excluded. Clinical correlation is recommended. 2. Small bilateral pleural effusions, new or worsened since the prior CT. 3. Extensive retroperitoneal nodal metastatic disease as well as hepatic and osseous metastatic disease as seen previously. The liver lesions are better seen on the prior contrast enhanced CT. 4. Atrophic native kidneys with a Left lower quadrant renal transplant. No hydronephrosis or nephrolithiasis. 5. Diffuse subcutaneous edema and anasarca, new or worsened since the prior CT. 6. Aortic Atherosclerosis (ICD10-I70.0). Electronically Signed   By: Anner Crete M.D.   On: 07/12/2020 17:07   MR BRAIN WO CONTRAST  Result Date: 07/12/2020 CLINICAL DATA:  Metastatic carcinoma, mental status change EXAM: MRI HEAD WITHOUT CONTRAST TECHNIQUE: Multiplanar, multiecho pulse sequences of the brain and surrounding structures were obtained without intravenous contrast. COMPARISON:  06/28/2020 FINDINGS: Brain: There is no acute infarction or intracranial hemorrhage. There is no intracranial mass, mass effect, or edema. There is no hydrocephalus or extra-axial fluid collection. Ventricles are normal in size and configuration. Vascular: Major vessel flow voids at the skull base are preserved. Skull and upper cervical spine:  Discrete C2 lesion on prior postcontrast imaging is not  identified on this noncontrast study. Sinuses/Orbits: Paranasal sinuses are aerated. Orbits are unremarkable. Other: Sella is unremarkable.  Mastoid air cells are clear. IMPRESSION: No acute intracranial abnormality. Electronically Signed   By: Macy Mis M.D.   On: 07/12/2020 20:45   MR Brain W Wo Contrast  Result Date: 06/28/2020 CLINICAL DATA:  Urologic cancer, staging EXAM: MRI HEAD WITHOUT AND WITH CONTRAST TECHNIQUE: Multiplanar, multiecho pulse sequences of the brain and surrounding structures were obtained without and with intravenous contrast. CONTRAST:  21mL GADAVIST GADOBUTROL 1 MMOL/ML IV SOLN COMPARISON:  MRI head 11/17/2016 FINDINGS: Brain: No acute infarction, hemorrhage, hydrocephalus, extra-axial collection or mass lesion. Normal white matter. Negative for metastatic disease to the brain. No enhancing mass lesion in the brain. Vascular: Normal arterial flow voids. Skull and upper cervical spine: Enhancing lesion in the lower body of the C2 vertebral body. This shows decreased signal prior to contrast and is consistent with metastatic disease. No fracture or cord compression. No skull lesion identified. Benign fatty lesion in the left parietal bone. Sinuses/Orbits: Paranasal sinuses clear.  Negative orbit Other: None IMPRESSION: Negative for metastatic disease to the brain 1 cm enhancing lesion C2 vertebral body compatible with metastatic disease. No fracture or cord compression Electronically Signed   By: Franchot Gallo M.D.   On: 06/28/2020 21:31   MR LUMBAR SPINE WO CONTRAST  Result Date: 07/12/2020 CLINICAL DATA:  Initial evaluation for severe lower back pain and pelvic pain, history of renal transplant with metastatic carcinoma. EXAM: MRI LUMBAR SPINE WITHOUT CONTRAST MRI PELVIS WITHOUT CONTRAST TECHNIQUE: Multiplanar, multisequence MR imaging of the lumbar spine was performed. No intravenous contrast was administered.  COMPARISON:  Prior CT from earlier the same day. FINDINGS: MRI LUMBAR SPINE FINDINGS: Segmentation: Standard. Lowest well-formed disc space labeled the L5-S1 level. Alignment: Physiologic with preservation of the normal lumbar lordosis. No listhesis or static subluxation. Vertebrae: Abnormal T1 hypointense, STIR hyperintense lesion seen involving the T12, L1, and L2 vertebral bodies, consistent with osseous metastatic disease. Involvement of these levels is fairly diffuse and infiltrative in nature. Heterogeneous marrow signal elsewhere within the visualized lumbosacral spine without definite discrete metastasis. No associated pathologic fracture. No extra osseous extension or epidural extension of tumor. Conus medullaris and cauda equina: Conus extends to the L1 level. Conus and cauda equina appear normal. Paraspinal and other soft tissues: Extensive bulky retroperitoneal and iliac adenopathy, better seen on recent CT. Associated reactive marrow edema within the posterior paraspinous and psoas musculature. Marked atrophy of the native kidneys noted. Renal transplant within the left iliac fossa partially visualized. Disc levels: No significant disc pathology seen within the lumbar spine. Intervertebral discs are well hydrated with preserved disc height. No significant disc bulge or focal disc herniation. Minimal multilevel facet hypertrophy noted throughout the lumbar spine. Epidural lipomatosis noted within the lower lumbar spine. No significant spinal stenosis. Foramina remain patent. No impingement. MRI PELVIS FINDINGS: Examination is technically limited as the patient was unable to tolerate the full length of the examination, with the exam terminated early. Coronal T1 weighted sequence only was performed. Visualized bowels normal in caliber without evidence for obstruction. Normal appendix. Marked atrophy of the native kidneys with a renal transplant within the left iliac fossa. No appreciable hydronephrosis or  other obvious abnormality about the renal transplant on this limited exam. Urinary bladder within normal limits. Uterus within normal limits. Few nabothian cysts noted about the cervix. Ovaries not well evaluated on this limited study. No appreciable adnexal mass. Normal flow voids seen throughout  the visualized abdomen and pelvis. Extensive bulky retroperitoneal and iliac adenopathy again seen, better evaluated on dedicated CT from earlier the same day. For reference purposes, the largest nodal conglomerate seen within the lower right periaortic region and measures 5.0 x 4.7 cm (series 11, image 24). Scattered inguinal adenopathy noted as well, with largest node seen on the left and measuring 1.9 cm in short axis (series 11, image 26). Scattered small volume free fluid noted within the pelvis. Scattered edema within the psoas and paraspinous musculature better seen on corresponding lumbar spine portion of this exam. 4.6 x 4.3 cm heterogeneous and partially calcified collection at the right lower quadrant again noted, nonspecific, but may be related to a chronic hematoma or other collection (series 10, image 57). Finding better evaluated on prior CT. Heterogeneous signal intensity seen throughout the bone marrow of the pelvis. Probable osseous metastasis measuring approximately 1.8 cm seen at the right ischial tuberosity (series 11, image 13). No other definite discrete osseous metastatic lesions seen on this limited exam. IMPRESSION: 1. Osseous metastatic disease involving the T12, L1, and L2 vertebral bodies. No associated pathologic fracture or extra osseous extension of tumor. 2. Probable 1.8 cm osseous metastasis involving the right ischial tuberosity. 3. Extensive bulky retroperitoneal, iliac, and inguinal adenopathy as above, consistent with nodal metastatic disease. Findings better evaluated on recent CT. 4. Scattered edema within the posterior paraspinous and psoas musculature, with scattered small volume  free fluid within the pelvis. Findings are likely reactive in nature. 5. Marked atrophy of the native kidneys with left lower quadrant renal transplant in place. No appreciable hydronephrosis or other obvious abnormality about the renal transplant on this limited exam. 6. No significant disc pathology or stenosis within the lumbar spine. No impingement. 7. Please note that the pelvic portion of this exam is markedly limited as the patient terminated the study early. Coronal T1 weighted sequence only was performed. Electronically Signed   By: Jeannine Boga M.D.   On: 07/12/2020 23:36   MR PELVIS WO CONTRAST  Result Date: 07/12/2020 CLINICAL DATA:  Initial evaluation for severe lower back pain and pelvic pain, history of renal transplant with metastatic carcinoma. EXAM: MRI LUMBAR SPINE WITHOUT CONTRAST MRI PELVIS WITHOUT CONTRAST TECHNIQUE: Multiplanar, multisequence MR imaging of the lumbar spine was performed. No intravenous contrast was administered. COMPARISON:  Prior CT from earlier the same day. FINDINGS: MRI LUMBAR SPINE FINDINGS: Segmentation: Standard. Lowest well-formed disc space labeled the L5-S1 level. Alignment: Physiologic with preservation of the normal lumbar lordosis. No listhesis or static subluxation. Vertebrae: Abnormal T1 hypointense, STIR hyperintense lesion seen involving the T12, L1, and L2 vertebral bodies, consistent with osseous metastatic disease. Involvement of these levels is fairly diffuse and infiltrative in nature. Heterogeneous marrow signal elsewhere within the visualized lumbosacral spine without definite discrete metastasis. No associated pathologic fracture. No extra osseous extension or epidural extension of tumor. Conus medullaris and cauda equina: Conus extends to the L1 level. Conus and cauda equina appear normal. Paraspinal and other soft tissues: Extensive bulky retroperitoneal and iliac adenopathy, better seen on recent CT. Associated reactive marrow edema  within the posterior paraspinous and psoas musculature. Marked atrophy of the native kidneys noted. Renal transplant within the left iliac fossa partially visualized. Disc levels: No significant disc pathology seen within the lumbar spine. Intervertebral discs are well hydrated with preserved disc height. No significant disc bulge or focal disc herniation. Minimal multilevel facet hypertrophy noted throughout the lumbar spine. Epidural lipomatosis noted within the lower lumbar spine. No  significant spinal stenosis. Foramina remain patent. No impingement. MRI PELVIS FINDINGS: Examination is technically limited as the patient was unable to tolerate the full length of the examination, with the exam terminated early. Coronal T1 weighted sequence only was performed. Visualized bowels normal in caliber without evidence for obstruction. Normal appendix. Marked atrophy of the native kidneys with a renal transplant within the left iliac fossa. No appreciable hydronephrosis or other obvious abnormality about the renal transplant on this limited exam. Urinary bladder within normal limits. Uterus within normal limits. Few nabothian cysts noted about the cervix. Ovaries not well evaluated on this limited study. No appreciable adnexal mass. Normal flow voids seen throughout the visualized abdomen and pelvis. Extensive bulky retroperitoneal and iliac adenopathy again seen, better evaluated on dedicated CT from earlier the same day. For reference purposes, the largest nodal conglomerate seen within the lower right periaortic region and measures 5.0 x 4.7 cm (series 11, image 24). Scattered inguinal adenopathy noted as well, with largest node seen on the left and measuring 1.9 cm in short axis (series 11, image 26). Scattered small volume free fluid noted within the pelvis. Scattered edema within the psoas and paraspinous musculature better seen on corresponding lumbar spine portion of this exam. 4.6 x 4.3 cm heterogeneous and  partially calcified collection at the right lower quadrant again noted, nonspecific, but may be related to a chronic hematoma or other collection (series 10, image 57). Finding better evaluated on prior CT. Heterogeneous signal intensity seen throughout the bone marrow of the pelvis. Probable osseous metastasis measuring approximately 1.8 cm seen at the right ischial tuberosity (series 11, image 13). No other definite discrete osseous metastatic lesions seen on this limited exam. IMPRESSION: 1. Osseous metastatic disease involving the T12, L1, and L2 vertebral bodies. No associated pathologic fracture or extra osseous extension of tumor. 2. Probable 1.8 cm osseous metastasis involving the right ischial tuberosity. 3. Extensive bulky retroperitoneal, iliac, and inguinal adenopathy as above, consistent with nodal metastatic disease. Findings better evaluated on recent CT. 4. Scattered edema within the posterior paraspinous and psoas musculature, with scattered small volume free fluid within the pelvis. Findings are likely reactive in nature. 5. Marked atrophy of the native kidneys with left lower quadrant renal transplant in place. No appreciable hydronephrosis or other obvious abnormality about the renal transplant on this limited exam. 6. No significant disc pathology or stenosis within the lumbar spine. No impingement. 7. Please note that the pelvic portion of this exam is markedly limited as the patient terminated the study early. Coronal T1 weighted sequence only was performed. Electronically Signed   By: Jeannine Boga M.D.   On: 07/12/2020 23:36   NM Hepatobiliary Liver Func  Result Date: 07/15/2020 CLINICAL DATA:  Right upper quadrant pain. EXAM: NUCLEAR MEDICINE HEPATOBILIARY IMAGING TECHNIQUE: Sequential images of the abdomen were obtained out to 60 minutes following intravenous administration of radiopharmaceutical. RADIOPHARMACEUTICALS:  8.0 mCi Tc-15m  Choletec IV COMPARISON:  None. FINDINGS:  Prompt uptake and biliary excretion of activity by the liver is seen. Gallbladder activity is visualized, consistent with patency of cystic duct. Biliary activity passes into small bowel, consistent with patent common bile duct. IMPRESSION: Prompt visualization of the gallbladder without evidence of acute cholecystitis. Electronically Signed   By: Virgina Norfolk M.D.   On: 07/15/2020 17:25   CT ABDOMEN PELVIS W CONTRAST  Result Date: 07/01/2020 CLINICAL DATA:  Sepsis, abdominal pain, previous kidney transplant x2. Metastatic carcinoma of unknown primary. EXAM: CT ABDOMEN AND PELVIS WITH CONTRAST TECHNIQUE:  Multidetector CT imaging of the abdomen and pelvis was performed using the standard protocol following bolus administration of intravenous contrast. CONTRAST:  106mL OMNIPAQUE IOHEXOL 300 MG/ML  SOLN COMPARISON:  06/26/2020 and previous FINDINGS: Lower chest: Trace pleural effusions. Hepatobiliary: Multiple liver lesions. 3.2 cm segment 2 lesion shows definite progression since 05/27/2020, although is more wedge-shaped and may represent focal infarct. Multiple low-attenuation lesions in the right lobe have not convincingly changed allowing for differences in scan timing. There are nonspecific transient hepatic attenuation differences on the portal venous phase which resolve on delayed venous phase imaging. No biliary ductal dilatation. Gallbladder unremarkable. Pancreas: Unremarkable. No pancreatic ductal dilatation or surrounding inflammatory changes. Spleen: Normal in size. There peripheral areas of decreased enhancement which were not present on prior study of 05/27/2020, however this area was not included on the delayed scan, and may represent transient attenuation differences versus true lesions. Adrenals/Urinary Tract: Adrenal glands unremarkable. Marked atrophy of native kidneys. Normal enhancement of left iliac fossa transplant kidney. Coarse calcifications and low-attenuation in the right iliac  fossa presumably related to remote transplant. The urinary bladder is incompletely distended. Stomach/Bowel: Stomach is decompressed. Small bowel is nondilated. The colon is incompletely distended, unremarkable. Vascular/Lymphatic: Bulky bilateral inguinal, left external iliac, bilateral common iliac, left para-aortic and aortocaval adenopathy as before. The larger nodes contain scattered calcifications and some with central low-attenuation regions as before. Moderate atheromatous aortoiliac plaque without aneurysm. Reproductive: Uterine fibroid.  No adnexal mass. Other: Small volume perihepatic and pelvic ascites slightly increased. No free air. Musculoskeletal: Poorly marginated sclerotic lesions in T12, L1, and L2 vertebral bodies, progressive since 05/16/2020. benign left supra-acetabular bone island stable since 02/03/2015. IMPRESSION: 1. No definite source of sepsis identified. 2. Extensive pelvic and retroperitoneal nodal; hepatic; and osseous metastatic disease. 3. Slight increase in small volume perihepatic and pelvic ascites. Aortic Atherosclerosis (ICD10-I70.0). Electronically Signed   By: Lucrezia Europe M.D.   On: 07/01/2020 14:23   DG CHEST PORT 1 VIEW  Result Date: 07/13/2020 CLINICAL DATA:  Central line placement. EXAM: PORTABLE CHEST 1 VIEW COMPARISON:  07/12/2020 FINDINGS: Enteric tube courses into the region of the stomach and off the film as tip is not visualized. Interval placement of right IJ central venous catheter which has tip over the right atrium and could be pulled back 4-5 cm. Lungs are somewhat hypoinflated demonstrate mild asymmetric bilateral perihilar opacification left worse than right with slight interval improvement as this may be due to interstitial edema versus infection. No effusion. No pneumothorax. Cardiomediastinal silhouette and remainder of the exam is unchanged. IMPRESSION: 1. Slight interval improvement in bilateral perihilar opacification left worse than right which may  be due to interstitial edema versus infection. 2. Right IJ central venous catheter with tip over the right atrium and could be pulled back 4-5 cm. No pneumothorax. Electronically Signed   By: Marin Olp M.D.   On: 07/13/2020 09:59   DG CHEST PORT 1 VIEW  Result Date: 07/12/2020 CLINICAL DATA:  Dyspnea EXAM: PORTABLE CHEST 1 VIEW COMPARISON:  07/11/2020 FINDINGS: Supine chest radiograph. Lung volumes are small and pulmonary insufflation has diminished since prior examination. There is developing consolidation within the left mid lung zone, likely infectious in the acute setting. Linear atelectasis within the right mid lung zone. No pneumothorax or pleural effusion. Cardiac size within normal limits. Pulmonary vascularity is normal. IMPRESSION: Left mid lung zone consolidation, likely infectious. Electronically Signed   By: Fidela Salisbury MD   On: 07/12/2020 06:53   DG CHEST PORT  1 VIEW  Result Date: 07/11/2020 CLINICAL DATA:  Shortness of breath EXAM: PORTABLE CHEST 1 VIEW COMPARISON:  07/10/2020 FINDINGS: Hazy opacity of the left more than right lower chest. No visible effusion or pneumothorax. Normal heart size and mediastinal contours. IMPRESSION: Stable low volume chest with infiltrates or atelectasis at the bases. Electronically Signed   By: Monte Fantasia M.D.   On: 07/11/2020 07:39   DG CHEST PORT 1 VIEW  Result Date: 07/10/2020 CLINICAL DATA:  Liver carcinoma.  Hypertension. EXAM: PORTABLE CHEST 1 VIEW COMPARISON:  June 26, 2020 FINDINGS: There is consolidation in the left lower lobe with small left pleural effusion. There is ill-defined airspace opacity in the right base. Heart size and pulmonary vascularity are normal. No adenopathy. There is aortic atherosclerosis. No bone lesions. IMPRESSION: Consolidation left lower lobe with small left pleural effusion. Patchy infiltrate right base. Heart size within normal limits. No adenopathy evident. Aortic Atherosclerosis (ICD10-I70.0).  Electronically Signed   By: Lowella Grip III M.D.   On: 07/10/2020 13:48   DG Chest Port 1 View  Result Date: 06/26/2020 CLINICAL DATA:  Weakness EXAM: PORTABLE CHEST 1 VIEW COMPARISON:  05/26/2020 FINDINGS: The heart size and mediastinal contours are within normal limits. Both lungs are clear. No pleural effusion or pneumothorax. The visualized skeletal structures are unremarkable. IMPRESSION: No acute process in the chest. Electronically Signed   By: Macy Mis M.D.   On: 06/26/2020 13:45   DG Abd Portable 2V  Result Date: 07/08/2020 CLINICAL DATA:  Nausea and vomiting. EXAM: PORTABLE ABDOMEN - 2 VIEW COMPARISON:  07/01/2020 FINDINGS: Gas distended transverse colon but no overt obstructive pattern. No abnormal stool retention or evidence of small bowel obstruction. Clear lung bases. Calcification in the right iliac fossa from failed transplant based on CT. IMPRESSION: Normal bowel gas pattern.  No abnormal stool retention. Electronically Signed   By: Monte Fantasia M.D.   On: 07/08/2020 11:18   DG Loyce Dys Tube Plc W/Fl W/Rad  Result Date: 07/12/2020 CLINICAL DATA:  Poor fluid intake. Hemodialysis patient. Failed bedside attempt at feeding tube placement. EXAM: FEEDING TUBE PLACEMENT UNDER FLUOROSCOPY CONTRAST:  Approximately 15 cc Omnipaque 300 per feeding tube. FLUOROSCOPY TIME:  Fluoroscopy Time: 4 minutes and 48 seconds of low-dose pulsed fluoroscopy. Radiation Exposure Index (if provided by the fluoroscopic device): 81.9 mGy Number of Acquired Spot Images: 0 COMPARISON:  Chest CT 07/13/2019 FINDINGS: Time out procedure was performed. A feeding tube was placed via the left nares and advanced into the esophagus and stomach under fluoroscopy. The tube was manipulated into the duodenum with the aid of a stiff Amplatz wire. Tube position in the descending duodenum was confirmed with a spot image following the injection of a small amount of contrast through the tube. The patient tolerated the  procedure without immediate complications. IMPRESSION: Feeding tube placement into the mid duodenum as described. Electronically Signed   By: Richardean Sale M.D.   On: 07/12/2020 14:27   EEG adult  Result Date: 07/11/2020 Lora Havens, MD     07/11/2020  3:41 PM Patient Name: AUBRIEGH MINCH MRN: [id number] Epilepsy Attending: Lora Havens Referring Physician/Provider: Dr Carlisle Cater Date: 07/11/2020 Duration: 24.15 minutes Patient history: 44 year old female with altered mental status.  EEG to evaluate for seizures. Level of alertness: Awake AEDs during EEG study: None Technical aspects: This EEG study was done with scalp electrodes positioned according to the 10-20 International system of electrode placement. Electrical activity was acquired at a sampling rate  of 500Hz  and reviewed with a high frequency filter of 70Hz  and a low frequency filter of 1Hz . EEG data were recorded continuously and digitally stored. Description: The posterior dominant rhythm consists of 8 Hz activity of moderate voltage (25-35 uV) seen predominantly in posterior head regions, symmetric and reactive to eye opening and eye closing.  Hyperventilation and photic stimulation were not performed.   IMPRESSION: This study is within normal limits. No seizures or epileptiform discharges were seen throughout the recording. Lora Havens   ECHOCARDIOGRAM COMPLETE  Result Date: 07/06/2020    ECHOCARDIOGRAM REPORT   Patient Name:   MIKKI ZIFF Date of Exam: 07/06/2020 Medical Rec #:  [id number]           Height:       67.0 in Accession #:    [id number]          Weight:       126.3 lb Date of Birth:  [birth year]            BSA:          1.664 m Patient Age:    64 years            BP:           128/88 mmHg Patient Gender: F                   HR:           131 bpm. Exam Location:  Inpatient Procedure: 2D Echo, Cardiac Doppler and Color Doppler Indications:    R00.2 Palpitations  History:        Patient has no prior  history of Echocardiogram examinations.                 Risk Factors:Hypertension. GERD. ESRD.  Sonographer:    Jonelle Sidle Dance Referring Phys: [place]  1. Left ventricular ejection fraction, by estimation, is >75%. The left ventricle has hyperdynamic function. The left ventricle has no regional wall motion abnormalities. Left ventricular diastolic parameters are indeterminate.  2. Right ventricular systolic function is normal. The right ventricular size is normal. There is normal pulmonary artery systolic pressure.  3. The mitral valve is normal in structure. No evidence of mitral valve regurgitation. No evidence of mitral stenosis.  4. The aortic valve is tricuspid. Aortic valve regurgitation is not visualized. No aortic stenosis is present.  5. The inferior vena cava is normal in size with greater than 50% respiratory variability, suggesting right atrial pressure of 3 mmHg. FINDINGS  Left Ventricle: Left ventricular ejection fraction, by estimation, is >75%. The left ventricle has hyperdynamic function. The left ventricle has no regional wall motion abnormalities. The left ventricular internal cavity size was normal in size. There is no left ventricular hypertrophy. Left ventricular diastolic parameters are indeterminate. Right Ventricle: The right ventricular size is normal.Right ventricular systolic function is normal. There is normal pulmonary artery systolic pressure. The tricuspid regurgitant velocity is 2.50 m/s, and with an assumed right atrial pressure of 3 mmHg, the estimated right ventricular systolic pressure is 83.1 mmHg. Left Atrium: Left atrial size was normal in size. Right Atrium: Right atrial size was normal in size. Pericardium: Trivial pericardial effusion is present. Mitral Valve: The mitral valve is normal in structure. No evidence of mitral valve regurgitation. No evidence of mitral valve stenosis. Tricuspid Valve: The tricuspid valve is normal in structure. Tricuspid  valve regurgitation is mild . No evidence of tricuspid stenosis. Aortic Valve: The aortic  valve is tricuspid. Aortic valve regurgitation is not visualized. No aortic stenosis is present. Pulmonic Valve: The pulmonic valve was normal in structure. Pulmonic valve regurgitation is not visualized. No evidence of pulmonic stenosis. Aorta: The aortic root is normal in size and structure. Venous: The inferior vena cava is normal in size with greater than 50% respiratory variability, suggesting right atrial pressure of 3 mmHg.  LEFT VENTRICLE PLAX 2D LVIDd:         3.30 cm LVIDs:         2.50 cm LV PW:         0.90 cm LV IVS:        1.00 cm LVOT diam:     1.90 cm LV SV:         51 LV SV Index:   31 LVOT Area:     2.84 cm  RIGHT VENTRICLE             IVC RV Basal diam:  2.00 cm     IVC diam: 0.80 cm RV S prime:     15.90 cm/s TAPSE (M-mode): 1.9 cm LEFT ATRIUM             Index       RIGHT ATRIUM          Index LA diam:        2.90 cm 1.74 cm/m  RA Area:     6.11 cm LA Vol (A2C):   26.9 ml 16.17 ml/m RA Volume:   8.36 ml  5.03 ml/m LA Vol (A4C):   10.0 ml 6.00 ml/m LA Biplane Vol: 16.5 ml 9.92 ml/m  AORTIC VALVE LVOT Vmax:   112.00 cm/s LVOT Vmean:  78.300 cm/s LVOT VTI:    0.181 m  AORTA Ao Root diam: 2.80 cm Ao Asc diam:  2.90 cm MITRAL VALVE               TRICUSPID VALVE MV Area (PHT): 4.85 cm    TR Peak grad:   25.0 mmHg MV Decel Time: 157 msec    TR Vmax:        250.00 cm/s MV E velocity: 98.00 cm/s                            SHUNTS                            Systemic VTI:  0.18 m                            Systemic Diam: 1.90 cm Kirk Ruths MD Electronically signed by Kirk Ruths MD Signature Date/Time: 07/06/2020/2:34:19 PM    Final    VAS [place] LOWER EXTREMITY VENOUS (DVT)  Result Date: 07/09/2020  Lower Venous DVT Study Indications: Pain in thighs bilaterally.  Risk Factors: Cancer Metastatic carcinoma to liver, hx of RCC. Comparison Study: No prior studies. Performing Technologist: Darlin Coco,  RDMS  Examination Guidelines: A complete evaluation includes B-mode imaging, spectral Doppler, color Doppler, and power Doppler as needed of all accessible portions of each vessel. Bilateral testing is considered an integral part of a complete examination. Limited examinations for reoccurring indications may be performed as noted. The reflux portion of the exam is performed with the patient in reverse Trendelenburg.  +---------+---------------+---------+-----------+----------+--------------+ RIGHT    CompressibilityPhasicitySpontaneityPropertiesThrombus Aging +---------+---------------+---------+-----------+----------+--------------+ CFV  Full           Yes      Yes                                 +---------+---------------+---------+-----------+----------+--------------+ SFJ      Full                                                        +---------+---------------+---------+-----------+----------+--------------+ FV Prox  Full                                                        +---------+---------------+---------+-----------+----------+--------------+ FV Mid   Full                                                        +---------+---------------+---------+-----------+----------+--------------+ FV DistalFull                                                        +---------+---------------+---------+-----------+----------+--------------+ PFV      Full                                                        +---------+---------------+---------+-----------+----------+--------------+ POP      Full           Yes      Yes                                 +---------+---------------+---------+-----------+----------+--------------+ PTV      Full                                                        +---------+---------------+---------+-----------+----------+--------------+ PERO     Full                                                         +---------+---------------+---------+-----------+----------+--------------+   +---------+---------------+---------+-----------+----------+--------------+ LEFT     CompressibilityPhasicitySpontaneityPropertiesThrombus Aging +---------+---------------+---------+-----------+----------+--------------+ CFV      Full           Yes      Yes                                 +---------+---------------+---------+-----------+----------+--------------+  SFJ      Full                                                        +---------+---------------+---------+-----------+----------+--------------+ FV Prox  Full                                                        +---------+---------------+---------+-----------+----------+--------------+ FV Mid   Full                                                        +---------+---------------+---------+-----------+----------+--------------+ FV DistalFull                                                        +---------+---------------+---------+-----------+----------+--------------+ PFV      Full                                                        +---------+---------------+---------+-----------+----------+--------------+ POP      Full           Yes      Yes                                 +---------+---------------+---------+-----------+----------+--------------+ PTV      Full                                                        +---------+---------------+---------+-----------+----------+--------------+ PERO     Full                                                        +---------+---------------+---------+-----------+----------+--------------+     Summary: RIGHT: - There is no evidence of deep vein thrombosis in the lower extremity.  - No cystic structure found in the popliteal fossa. - Ultrasound characteristics of enlarged lymph nodes are noted in the groin.  LEFT: - There is no evidence of deep vein thrombosis  in the lower extremity.  - No cystic structure found in the popliteal fossa. - Ultrasound characteristics of enlarged lymph nodes noted in the groin.  *See table(s) above for measurements and observations. Electronically signed by Curt Jews MD on 07/09/2020 at 4:37:48 PM.    Final    US Abdomen Limited RUQ (LIVER/GB)  Result Date: 07/13/2020  CLINICAL DATA:  Concern for cholecystitis. EXAM: ULTRASOUND ABDOMEN LIMITED RIGHT UPPER QUADRANT COMPARISON:  Ultrasound June 27, 2020. CT scan July 12, 2020. FINDINGS: Gallbladder: Tumefactive sludge is seen in the gallbladder. A small amount of pericholecystic fluid is identified. No Murphy's sign is identified. No wall thickening or stones. Common bile duct: Diameter: 4 mm Liver: Diffusely heterogeneous consistent with multiple known lesions. A representative metastatic lesion measures up to 3.2 cm. Portal vein is patent on color Doppler imaging with normal direction of blood flow towards the liver. Other: None. IMPRESSION: 1. There is significant sludge in the gallbladder. There is now a small amount of pericholecystic fluid. However, no Murphy's sign, wall thickening, or stone identified. A HIDA scan could further evaluate if the picture remains clinically ambiguous. 2. Known metastatic disease in the liver, better assessed on CT imaging. Electronically Signed   By: Dorise Bullion III M.D   On: 07/13/2020 18:18   US Abdomen Limited RUQ (LIVER/GB)  Result Date: 06/27/2020 CLINICAL DATA:  Abdominal pain.  Known liver lesions. EXAM: ULTRASOUND ABDOMEN LIMITED RIGHT UPPER QUADRANT COMPARISON:  Noncontrast abdominal CT yesterday. Contrast-enhanced abdominal CT 05/27/2020 FINDINGS: Gallbladder: Physiologically distended containing intraluminal sludge. No shadowing stones. No wall thickening. No sonographic Murphy sign noted by sonographer. Common bile duct: Diameter: 4-5 mm. Liver: Heterogeneous liver. Many of the liver lesions on prior contrast-enhanced CT are  not well seen by ultrasound. There is a 1.8 x 1.5 x 1.5 cm complex hypoechoic lesion in the left lobe. No convincing subcapsular collection is suggested on CT. Portal vein is patent on color Doppler imaging with normal direction of blood flow towards the liver. Other: No right upper quadrant ascites. IMPRESSION: 1. Gallbladder sludge. No gallstones or sonographic findings of acute cholecystitis. 2. No biliary dilatation. 3. Heterogeneous liver with 1.8 cm hypoechoic lesion in the left lobe of the liver. Many of the additional liver lesions on prior contrast-enhanced CT are not well seen by ultrasound. 4. No sonographic evidence of subcapsular collection is suggested on CT. Electronically Signed   By: Keith Rake M.D.   On: 06/27/2020 17:44      Subjective: Patient seen and examined at the bedside this morning.  Hemodynamically stable during my evaluation.  She was alert and oriented during my conversation today and she was denying any abdomen pain, nausea or vomiting.  Discharge Exam: Vitals:   07/16/20 1100 07/16/20 1200  BP:    Pulse: (!) 105   Resp: 18   Temp:  98.2 F (36.8 C)  SpO2: 97%    Vitals:   07/16/20 0900 07/16/20 1000 07/16/20 1100 07/16/20 1200  BP:  (!) 139/105    Pulse: (!) 104 (!) 107 (!) 105   Resp: 16 (!) 24 18   Temp:    98.2 F (36.8 C)  TempSrc:    Axillary  SpO2: 97% 97% 97%   Weight:      Height:        General: Pt is weak, chronically looking Cardiovascular: Sinus tachycardia, no rubs, no gallops Respiratory: CTA bilaterally, no wheezing, no rhonchi Abdominal: Soft, NT, ND, bowel sounds + Extremities: Bilateral lower extremity edema, no cyanosis    The results of significant diagnostics from this hospitalization (including imaging, microbiology, ancillary and laboratory) are listed below for reference.     Microbiology: Recent Results (from the past 240 hour(s))  Culture, Urine     Status: None   Collection Time: 07/10/20  5:54 PM    Specimen: Urine, Random  Result  Value Ref Range Status   Specimen Description   Final    URINE, RANDOM Performed at [facility] [address] [place], [place] [address]    Special Requests   Final    NONE Performed at [facility], [place] [address], [place], [place] [address]    Culture   Final    NO GROWTH Performed at [facility], [place] [address] [place], [place] [address]    Report Status 07/11/2020 FINAL  Final  Culture, blood (routine x 2)     Status: None   Collection Time: 07/11/20 10:47 AM   Specimen: BLOOD LEFT HAND  Result Value Ref Range Status   Specimen Description   Final    BLOOD LEFT HAND Performed at [facility] [address], [place], [place] [address]    Special Requests   Final    BOTTLES DRAWN AEROBIC ONLY Blood Culture results may not be optimal due to an inadequate volume of blood received in culture bottles Performed at [facility] [address], [place], [place] [address]    Culture   Final    NO GROWTH 5 DAYS Performed at [facility], [place] [address], [place], [place] [address]    Report Status 07/16/2020 FINAL  Final  Culture, blood (routine x 2)     Status: None   Collection Time: 07/11/20 10:48 AM   Specimen: BLOOD LEFT HAND  Result Value Ref Range Status   Specimen Description   Final    BLOOD LEFT HAND Performed at [facility] [address] [place], [place] [address]    Special Requests   Final    BOTTLES DRAWN AEROBIC ONLY Blood Culture results may not be optimal due to an inadequate volume of blood received in culture bottles Performed at [facility] [address] [place], [place] [address]    Culture   Final    NO GROWTH 5 DAYS Performed at [facility], [place] [address] [place], [place] [address]    Report Status 07/16/2020 FINAL  Final  Culture, Urine     Status: None   Collection Time:  07/13/20  9:46 AM   Specimen: Urine, Random  Result Value Ref Range Status   Specimen Description   Final    URINE, RANDOM Performed at [facility] [address], [place], [place] [address]    Special Requests   Final    NONE Performed at [facility], [place] [address], [place], [place] [address]    Culture   Final    NO GROWTH Performed at [facility], [place] [address] [place], [place] [address]    Report Status 07/15/2020 FINAL  Final     Labs: BNP (last 3 results) No results for input(s): BNP in the last 8760 hours. Basic Metabolic Panel: Recent Labs  Lab 07/11/20 0411 07/12/20 0013 07/12/20 0330 07/13/20 0013 07/13/20 2356 07/14/20 0458 07/14/20 0742 07/15/20 1520 07/16/20 0500 07/16/20 0746  NA   < >  --  137 140  --   --  141 145 144  --   K   < >  --  4.8 4.5  --   --  3.8 3.3* 3.1*  --   CL   < >  --  105 109  --   --  112* 115* 114*  --   CO2   < >  --  22 20*  --   --  18* 20* 20*  --   GLUCOSE   < >  --  146* 111*  --   --  270* 93 170*  --   BUN   < >  --  21* 24*  --   --  34* 40* 39*  --   CREATININE   < >  --  1.08* 1.28*  --   --  1.33* 1.17* 1.24*  --   CALCIUM   < >  --  7.4* 7.6*  --   --  7.7* 7.9* 8.0*  --   MG   < > 1.9 2.1 1.9 1.8 1.9  --   --   --  1.8  PHOS  --  2.5 3.1 2.5 2.7 2.7  --   --   --   --    < > = values in this interval not displayed.   Liver Function Tests: Recent Labs  Lab 07/11/20 0411 07/12/20 0330 07/13/20 0013 07/15/20 1520 07/16/20 0500  AST 53* 55* 70* 200* 181*  ALT 32 29 30 59* 62*  ALKPHOS 214* 214* 298* 625* 650*  BILITOT 5.2* 5.0* 4.6* 6.4* 6.7*  PROT 5.5* 5.2* 5.1* 5.7* 5.6*  ALBUMIN 1.9* 1.7* 1.8* 1.8* 1.8*   No results for input(s): LIPASE, AMYLASE in the last 168 hours. Recent Labs  Lab 07/10/20 1727  AMMONIA 24   CBC: Recent Labs  Lab 07/10/20 1727 07/10/20 1727 07/11/20 0411 07/12/20 0330 07/13/20 0013 07/14/20 0742 07/16/20 0500  WBC 15.9*    < > 21.4* 29.3* 21.5* 21.3* 27.6*  NEUTROABS 13.0*  --  18.4* 26.9* 19.1*  --   --   HGB 7.7*   < > 8.0* 7.1* 8.6* 8.2* 8.3*  HCT 23.3*   < > 24.2* 21.6* 26.9* 25.2* 26.2*  MCV 84.4   < > 83.4 83.4 84.3 82.9 84.2  PLT 130*   < > 146* 148* 133* 157 213   < > = values in this interval not displayed.   Cardiac Enzymes: Recent Labs  Lab 07/10/20 0541  CKTOTAL 14*   BNP: Invalid input(s): POCBNP CBG: Recent Labs  Lab 07/15/20 1956 07/16/20 0038 07/16/20 0403 07/16/20 0747 07/16/20 1147  GLUCAP 172* 123* 135* 144* 127*   D-Dimer No results for input(s): DDIMER in the last 72 hours. Hgb A1c Recent Labs    07/14/20 0633  HGBA1C 6.0*   Lipid Profile No results for input(s): CHOL, HDL, LDLCALC, TRIG, CHOLHDL, LDLDIRECT in the last 72 hours. Thyroid function studies No results for input(s): TSH, T4TOTAL, T3FREE, THYROIDAB in the last 72 hours.  Invalid input(s): FREET3 Anemia work up No results for input(s): VITAMINB12, FOLATE, FERRITIN, TIBC, IRON, RETICCTPCT in the last 72 hours. Urinalysis    Component Value Date/Time   COLORURINE AMBER (A) 07/10/2020 1754   APPEARANCEUR CLEAR 07/10/2020 1754   LABSPEC 1.016 07/10/2020 1754   PHURINE 6.0 07/10/2020 1754   GLUCOSEU NEGATIVE 07/10/2020 1754   HGBUR NEGATIVE 07/10/2020 1754   HGBUR moderate 02/03/2010 1321   BILIRUBINUR SMALL (A) 07/10/2020 1754   BILIRUBINUR negative 11/14/2019 1505   BILIRUBINUR NEG 09/26/2015 1406   KETONESUR NEGATIVE 07/10/2020 1754   PROTEINUR 30 (A) 07/10/2020 1754   UROBILINOGEN 0.2 11/14/2019 1505   UROBILINOGEN 0.2 02/03/2015 1700   NITRITE NEGATIVE 07/10/2020 1754   LEUKOCYTESUR NEGATIVE 07/10/2020 1754   Sepsis Labs Invalid input(s): PROCALCITONIN,  WBC,  LACTICIDVEN Microbiology Recent Results (from the past 240 hour(s))  Culture, Urine     Status: None   Collection Time: 07/10/20  5:54 PM   Specimen: Urine, Random  Result Value Ref Range Status   Specimen Description   Final     URINE, RANDOM Performed at [facility] [address] [place], [place] [address]    Special Requests   Final    NONE Performed at [facility], [place] [address] [place], [place] [address]    Culture   Final    NO GROWTH Performed at [facility], [place] [address] [place], [place] [address]    Report Status 07/11/2020 FINAL  Final  Culture, blood (routine x 2)     Status: None   Collection Time: 07/11/20 10:47 AM   Specimen: BLOOD LEFT HAND  Result Value Ref Range Status   Specimen Description   Final    BLOOD LEFT HAND Performed at [facility] [address] [place], [place] [address]    Special Requests   Final    BOTTLES DRAWN AEROBIC ONLY Blood Culture results may not be optimal due to an inadequate volume of blood received in culture bottles Performed at [facility] [address] [place], [place] [address]    Culture   Final    NO GROWTH 5 DAYS Performed at [facility], [place] [address] [place], [place] [address]    Report Status 07/16/2020 FINAL  Final  Culture, blood (routine x 2)     Status: None   Collection Time: 07/11/20 10:48 AM   Specimen: BLOOD LEFT HAND  Result Value Ref Range Status   Specimen Description   Final    BLOOD LEFT HAND Performed at [facility] [address] [place], [place] [address]    Special Requests   Final    BOTTLES DRAWN AEROBIC ONLY Blood Culture results may not be optimal due to an inadequate volume of blood received in culture bottles Performed at [facility] [address] [place], [place] [address]    Culture   Final    NO GROWTH 5 DAYS Performed at [facility], [place] [address], [place], [place] [address]    Report Status 07/16/2020 FINAL  Final  Culture, Urine     Status: None   Collection Time: 07/13/20  9:46 AM   Specimen: Urine, Random  Result Value Ref Range Status    Specimen Description   Final    URINE, RANDOM Performed at [facility] [address], [place], [place] [address]    Special Requests   Final    NONE Performed at [facility], [place] [address], [place], [place] [address]    Culture   Final    NO GROWTH Performed at [facility], [place] [address] [place], [place] [address]    Report Status 07/15/2020 FINAL  Final    Please note: You were cared for by a hospitalist during your hospital stay. Once you are discharged, your primary care physician will handle any further medical issues. Please note that NO REFILLS for any discharge medications will be authorized once you are discharged, as it is imperative that you return to your primary care physician (or establish a relationship with a primary care physician if you do not have one) for your post hospital discharge needs so that they can reassess your need for medications and monitor your lab values.    Time  coordinating discharge: 40 minutes  SIGNED:   Shelly Coss, MD  [facility] 07/16/2020, 2:40 PM Pager [phone]  If 7PM-7AM, please contact night-coverage [email or web address] Password [email or web address]

## 2020-07-16 NOTE — Progress Notes (Signed)
OT Cancellation Note  Patient Details Name: Candace Griffin MRN: [id number] DOB: June 28, 1976   Cancelled Treatment:    Reason Eval/Treat Not Completed: Other (comment). Pt in bed upon arrival moaning and groaning. Introduced myself and told her I would like to work with her on sitting up on EOB to do grooming (washing face, hands, and brushing teeth). She shook her head yes, but as I turned to make sure she had all the items in her room needed, she said "not right now"--when I asked why not right she said she had had a rough night. Encouraged her to try and sit up and just to a little bit due to she continues to weaker and weaker with every day she is in the bed but again she said, "not right now". Made her aware that I could not come back later today and she shook her head in understanding. We will continue to follow.  [place], OTR/L Acute Rehab Services Pager [phone] Office [phone]     Almon Register 07/16/2020, 9:52 AM

## 2020-07-16 NOTE — Progress Notes (Signed)
Subjective: No new complaints   Antibiotics:  Anti-infectives (From admission, onward)   Start     Dose/Rate Route Frequency Ordered Stop   07/15/20 2000  meropenem (MERREM) 1 g in sodium chloride 0.9 % 100 mL IVPB        1 g 200 mL/hr over 30 Minutes Intravenous Every 8 hours 07/15/20 1519     07/14/20 0000  vancomycin (VANCOREADY) IVPB 750 mg/150 mL  Status:  Discontinued        750 mg 150 mL/hr over 60 Minutes Intravenous Every 12 hours 07/13/20 1030 07/15/20 1416   07/13/20 1200  meropenem (MERREM) 2 g in sodium chloride 0.9 % 100 mL IVPB  Status:  Discontinued        2 g 200 mL/hr over 30 Minutes Intravenous Every 8 hours 07/13/20 1030 07/15/20 1519   07/13/20 1130  vancomycin (VANCOREADY) IVPB 1500 mg/300 mL        1,500 mg 150 mL/hr over 120 Minutes Intravenous  Once 07/13/20 1030 07/13/20 1355   07/10/20 2100  azithromycin (ZITHROMAX) 500 mg in sodium chloride 0.9 % 250 mL IVPB  Status:  Discontinued        500 mg 250 mL/hr over 60 Minutes Intravenous Every 24 hours 07/10/20 1800 07/12/20 1439   07/10/20 2000  cefTRIAXone (ROCEPHIN) 2 g in sodium chloride 0.9 % 100 mL IVPB  Status:  Discontinued        2 g 200 mL/hr over 30 Minutes Intravenous Every 24 hours 07/10/20 1800 07/12/20 1439   06/28/20 1600  piperacillin-tazobactam (ZOSYN) IVPB 3.375 g  Status:  Discontinued        3.375 g 12.5 mL/hr over 240 Minutes Intravenous Every 8 hours 06/28/20 1508 07/02/20 0807   06/27/20 1200  ceFEPIme (MAXIPIME) 2 g in sodium chloride 0.9 % 100 mL IVPB  Status:  Discontinued        2 g 200 mL/hr over 30 Minutes Intravenous Every 8 hours 06/27/20 0821 06/27/20 0901   06/27/20 0500  vancomycin (VANCOREADY) IVPB 500 mg/100 mL  Status:  Discontinued        500 mg 100 mL/hr over 60 Minutes Intravenous Every 12 hours 06/26/20 1830 06/27/20 0901   06/27/20 0300  ceFEPIme (MAXIPIME) 2 g in sodium chloride 0.9 % 100 mL IVPB  Status:  Discontinued        2 g 200 mL/hr over 30  Minutes Intravenous Every 12 hours 06/26/20 1830 06/27/20 0821   06/26/20 1400  ceFEPIme (MAXIPIME) 2 g in sodium chloride 0.9 % 100 mL IVPB        2 g 200 mL/hr over 30 Minutes Intravenous  Once 06/26/20 1345 06/26/20 1509   06/26/20 1400  metroNIDAZOLE (FLAGYL) IVPB 500 mg        500 mg 100 mL/hr over 60 Minutes Intravenous  Once 06/26/20 1345 06/26/20 1736   06/26/20 1400  vancomycin (VANCOCIN) IVPB 1000 mg/200 mL premix        1,000 mg 200 mL/hr over 60 Minutes Intravenous  Once 06/26/20 1345 06/26/20 1741      Medications: Scheduled Meds: . enoxaparin (LOVENOX) injection  40 mg Subcutaneous QHS  . feeding supplement (PROSource TF)  45 mL Per Tube BID  . hydrocortisone sod succinate (SOLU-CORTEF) inj  100 mg Intravenous Q8H  . insulin aspart  3-9 Units Subcutaneous Q4H  . mouth rinse  15 mL Mouth Rinse BID  . metoprolol tartrate  12.5 mg Oral BID  .  oxyCODONE  5 mg Per Tube Q6H  . pantoprazole sodium  40 mg Per Tube BID  . potassium chloride  40 mEq Oral Q4H  . scopolamine  1 patch Transdermal Q72H  . sodium chloride flush  10-40 mL Intracatheter Q12H  . sodium chloride flush  10-40 mL Intracatheter Q12H  . sucralfate  1 g Oral TID WC & HS   Continuous Infusions: . feeding supplement (OSMOLITE 1.5 CAL) 60 mL/hr at 07/15/20 1814  . meropenem (MERREM) IV Stopped (07/16/20 0512)  . thiamine injection Stopped (07/15/20 2220)   PRN Meds:.acetaminophen **OR** acetaminophen, diphenhydrAMINE, gadobutrol, LORazepam, magic mouthwash w/lidocaine, metoCLOPramide (REGLAN) injection, morphine injection, ondansetron **OR** ondansetron (ZOFRAN) IV, oxyCODONE, promethazine, sodium chloride flush, sodium chloride flush    Objective: Weight change: 1.2 kg  Intake/Output Summary (Last 24 hours) at 07/16/2020 0951 Last data filed at 07/16/2020 0000 Gross per 24 hour  Intake 489.67 ml  Output --  Net 489.67 ml   Blood pressure (!) 135/105, pulse (!) 112, temperature 98 F (36.7 C),  temperature source Axillary, resp. rate (!) 31, height 5\' 7"  (1.702 m), weight 79.2 kg, SpO2 100 %. Temp:  [96.6 F (35.9 C)-98 F (36.7 C)] 98 F (36.7 C) (11/23 0800) Pulse Rate:  [99-127] 112 (11/23 0700) Resp:  [16-36] 31 (11/23 0700) BP: (130-160)/(96-112) 135/105 (11/23 0600) SpO2:  [94 %-100 %] 100 % (11/23 0700) Weight:  [79.2 kg] 79.2 kg (11/23 0459)  Physical Exam: General: Alert and awake, oriented x3 fatigued HEENT: anicteric sclera, EOMI central line in place CVS regular rate, normal no mgr Chest: , no wheezing, no respiratory distress , lungs clear Abdomen: soft non-distended, Skin: no rashes Neuro: nonfocal  CBC:    BMET Recent Labs    07/15/20 1520 07/16/20 0500  NA 145 144  K 3.3* 3.1*  CL 115* 114*  CO2 20* 20*  GLUCOSE 93 170*  BUN 40* 39*  CREATININE 1.17* 1.24*  CALCIUM 7.9* 8.0*     Liver Panel  Recent Labs    07/15/20 1520 07/16/20 0500  PROT 5.7* 5.6*  ALBUMIN 1.8* 1.8*  AST 200* 181*  ALT 59* 62*  ALKPHOS 625* 650*  BILITOT 6.4* 6.7*       Sedimentation Rate No results for input(s): ESRSEDRATE in the last 72 hours. C-Reactive Protein No results for input(s): CRP in the last 72 hours.  Micro Results: Recent Results (from the past 720 hour(s))  Culture, blood (routine x 2)     Status: None   Collection Time: 06/26/20  2:32 PM   Specimen: Site Not Specified; Blood  Result Value Ref Range Status   Specimen Description   Final    SITE NOT SPECIFIED Performed at [facility] [address], [place], [place] [address]    Special Requests   Final    BOTTLES DRAWN AEROBIC AND ANAEROBIC Blood Culture adequate volume Performed at [facility] [address] [place], [place] [address]    Culture   Final    NO GROWTH 5 DAYS Performed at [facility], [place] [name], [place], [place] [address]    Report Status 07/01/2020 FINAL  Final  Culture, blood (routine x 2)     Status: None     Collection Time: 06/26/20  2:50 PM   Specimen: Site Not Specified; Blood  Result Value Ref Range Status   Specimen Description   Final    SITE NOT SPECIFIED Performed at [facility]  [address] [place], [place] [address]    Special Requests   Final    BOTTLES DRAWN AEROBIC AND ANAEROBIC Blood Culture results may not be optimal due to an inadequate volume of blood received in culture bottles Performed at [facility] [address] [place], [place] [address]    Culture   Final    NO GROWTH 5 DAYS Performed at [facility], [place] [address] [place], [place] [address]    Report Status 07/01/2020 FINAL  Final  Respiratory Panel by RT PCR (Flu A&B, Covid) - Nasopharyngeal Swab     Status: None   Collection Time: 06/26/20  4:43 PM   Specimen: Nasopharyngeal Swab  Result Value Ref Range Status   SARS Coronavirus 2 by RT PCR NEGATIVE NEGATIVE Final    Comment: (NOTE) SARS-CoV-2 target nucleic acids are NOT DETECTED.  The SARS-CoV-2 RNA is generally detectable in upper respiratoy specimens during the acute phase of infection. The lowest concentration of SARS-CoV-2 viral copies this assay can detect is 131 copies/mL. A negative result does not preclude SARS-Cov-2 infection and should not be used as the sole basis for treatment or other patient management decisions. A negative result may occur with  improper specimen collection/handling, submission of specimen other than nasopharyngeal swab, presence of viral mutation(s) within the areas targeted by this assay, and inadequate number of viral copies (<131 copies/mL). A negative result must be combined with clinical observations, patient history, and epidemiological information. The expected result is Negative.  Fact Sheet for Patients:  PinkCheek.be  Fact Sheet for Healthcare Providers:  GravelBags.it  This test is no t yet  approved or cleared by the [place] FDA and  has been authorized for detection and/or diagnosis of SARS-CoV-2 by FDA under an Emergency Use Authorization (EUA). This EUA will remain  in effect (meaning this test can be used) for the duration of the COVID-19 declaration under Section 564(b)(1) of the Act, 21 U.S.C. section 360bbb-3(b)(1), unless the authorization is terminated or revoked sooner.     Influenza A by PCR NEGATIVE NEGATIVE Final   Influenza B by PCR NEGATIVE NEGATIVE Final    Comment: (NOTE) The Xpert Xpress SARS-CoV-2/FLU/RSV assay is intended as an aid in  the diagnosis of influenza from Nasopharyngeal swab specimens and  should not be used as a sole basis for treatment. Nasal washings and  aspirates are unacceptable for Xpert Xpress SARS-CoV-2/FLU/RSV  testing.  Fact Sheet for Patients: PinkCheek.be  Fact Sheet for Healthcare Providers: GravelBags.it  This test is not yet approved or cleared by the [place] FDA and  has been authorized for detection and/or diagnosis of SARS-CoV-2 by  FDA under an Emergency Use Authorization (EUA). This EUA will remain  in effect (meaning this test can be used) for the duration of the  Covid-19 declaration under Section 564(b)(1) of the Act, 21  U.S.C. section 360bbb-3(b)(1), unless the authorization is  terminated or revoked. Performed at [facility], [place] [address] [place], [place] [address]   MRSA PCR Screening     Status: None   Collection Time: 06/26/20  6:08 PM   Specimen: Nasal Mucosa; Nasopharyngeal  Result Value Ref Range Status   MRSA by PCR NEGATIVE NEGATIVE Final    Comment:        The GeneXpert MRSA Assay (FDA approved for NASAL specimens only), is one component of a comprehensive MRSA colonization surveillance program. It is not intended to diagnose MRSA infection nor to guide or monitor treatment for  MRSA  infections. Performed at [facility], [place] [address], [place], [place] [address]   Culture, Urine     Status: None   Collection Time: 07/10/20  5:54 PM   Specimen: Urine, Random  Result Value Ref Range Status   Specimen Description   Final    URINE, RANDOM Performed at [facility] [address] [place], [place] [address]    Special Requests   Final    NONE Performed at [facility], [place] [address], [place], [place] [address]    Culture   Final    NO GROWTH Performed at [facility], [place] [address] [place], [place] [address]    Report Status 07/11/2020 FINAL  Final  Culture, blood (routine x 2)     Status: None   Collection Time: 07/11/20 10:47 AM   Specimen: BLOOD LEFT HAND  Result Value Ref Range Status   Specimen Description   Final    BLOOD LEFT HAND Performed at [facility] [address] [place], [place] [address]    Special Requests   Final    BOTTLES DRAWN AEROBIC ONLY Blood Culture results may not be optimal due to an inadequate volume of blood received in culture bottles Performed at [facility] [address] [place], [place] [address]    Culture   Final    NO GROWTH 5 DAYS Performed at [facility], [place] [address] [place], [place] [address]    Report Status 07/16/2020 FINAL  Final  Culture, blood (routine x 2)     Status: None   Collection Time: 07/11/20 10:48 AM   Specimen: BLOOD LEFT HAND  Result Value Ref Range Status   Specimen Description   Final    BLOOD LEFT HAND Performed at [facility] [address] [place], [place] [address]    Special Requests   Final    BOTTLES DRAWN AEROBIC ONLY Blood Culture results may not be optimal due to an inadequate volume of blood received in culture bottles Performed at [facility] [address] [place], [place] [address]    Culture   Final    NO GROWTH 5  DAYS Performed at [facility], [place] [address], [place], [place] [address]    Report Status 07/16/2020 FINAL  Final  Culture, Urine     Status: None   Collection Time: 07/13/20  9:46 AM   Specimen: Urine, Random  Result Value Ref Range Status   Specimen Description   Final    URINE, RANDOM Performed at [facility] [address] [place], [place] [address]    Special Requests   Final    NONE Performed at [facility], [place] [address] [place], [place] [address]    Culture   Final    NO GROWTH Performed at [facility], [place] [address], [place], [place] [address]    Report Status 07/15/2020 FINAL  Final    Studies/Results: NM Hepatobiliary Liver Func  Result Date: 07/15/2020 CLINICAL DATA:  Right upper quadrant pain. EXAM: NUCLEAR MEDICINE HEPATOBILIARY IMAGING TECHNIQUE: Sequential images of the abdomen were obtained out to 60 minutes following intravenous administration of radiopharmaceutical. RADIOPHARMACEUTICALS:  8.0 mCi Tc-16m  Choletec IV COMPARISON:  None. FINDINGS: Prompt uptake and biliary excretion of activity by the liver is seen. Gallbladder activity is visualized, consistent with patency of cystic duct. Biliary activity passes into small  bowel, consistent with patent common bile duct. IMPRESSION: Prompt visualization of the gallbladder without evidence of acute cholecystitis. Electronically Signed   By: Virgina Norfolk M.D.   On: 07/15/2020 17:25      Assessment/Plan:  INTERVAL HISTORY: flexiseal for loose stools   Principal Problem:   Pancreatitis Active Problems:   GERD   Renal transplant recipient   Hypertension   Symptomatic anemia   Metastatic carcinoma to liver (HCC)   Severe sepsis (HCC)   AKI (acute kidney injury) ([place])   Hepatitis   Generalized abdominal pain   Renal cell carcinoma (HCC)   Nausea and vomiting   Malnutrition of moderate degree   Abdominal pain   Dyspnea   Poor fluid intake    Metastatic malignant neoplasm ([place])    Candace Griffin is a 44 y.o. female with renal transplant and now found to have renal cell carcinoma with metastses to bones and liver who had been on antibiotics for possible pneumonia, which were then stopped. Cabozantinib given with worsening confusion and then episode hypotension sp IV vancomycin and meropenem, pressors which have subsquently been weaned.   HIDA negative  Continue merrem for onw though I do NOT know what we are treating  Could her hypotension have been from her cabozanitinib?  She is awaiting transfer to [facility]  Loose stools on tube feeds   LOS: 20 days   Alcide Evener 07/16/2020, 9:51 AM

## 2020-07-23 LAB — VITAMIN B1: Vitamin B1 (Thiamine): 27.9 nmol/L — ABNORMAL LOW (ref 66.5–200.0)

## 2020-07-24 ENCOUNTER — Inpatient Hospital Stay: Payer: Medicare Other

## 2020-07-24 ENCOUNTER — Inpatient Hospital Stay: Payer: Medicare Other | Admitting: Hematology

## 2020-08-27 ENCOUNTER — Telehealth: Payer: Self-pay | Admitting: *Deleted

## 2020-08-27 NOTE — Telephone Encounter (Signed)
Received Telephone Advice fax from after hours [facility] - fax dated 08/26/20 1733 - following message: "[facility], Dr. Shon Baton says his mutual patient has decided to transition to home hospice" Dr. Irene Limbo informed

## 2020-09-24 DEATH — deceased
